# Patient Record
Sex: Male | Born: 1945
Health system: Southern US, Community
[De-identification: ages and names within clinical notes are randomized; demographics above are authoritative.]

## PROBLEM LIST (undated history)

## (undated) DIAGNOSIS — I10 Essential (primary) hypertension: Secondary | ICD-10-CM

## (undated) DIAGNOSIS — D649 Anemia, unspecified: Secondary | ICD-10-CM

## (undated) DIAGNOSIS — R3915 Urgency of urination: Secondary | ICD-10-CM

## (undated) DIAGNOSIS — Z148 Genetic carrier of other disease: Secondary | ICD-10-CM

## (undated) DIAGNOSIS — I4729 Other ventricular tachycardia: Secondary | ICD-10-CM

## (undated) DIAGNOSIS — Z8709 Personal history of other diseases of the respiratory system: Secondary | ICD-10-CM

## (undated) DIAGNOSIS — K219 Gastro-esophageal reflux disease without esophagitis: Secondary | ICD-10-CM

## (undated) DIAGNOSIS — T4145XA Adverse effect of unspecified anesthetic, initial encounter: Secondary | ICD-10-CM

## (undated) DIAGNOSIS — F0390 Unspecified dementia without behavioral disturbance: Secondary | ICD-10-CM

## (undated) DIAGNOSIS — I2510 Atherosclerotic heart disease of native coronary artery without angina pectoris: Secondary | ICD-10-CM

## (undated) DIAGNOSIS — R519 Headache, unspecified: Secondary | ICD-10-CM

## (undated) DIAGNOSIS — E7800 Pure hypercholesterolemia, unspecified: Secondary | ICD-10-CM

## (undated) DIAGNOSIS — I472 Ventricular tachycardia: Secondary | ICD-10-CM

## (undated) DIAGNOSIS — K5900 Constipation, unspecified: Secondary | ICD-10-CM

## (undated) DIAGNOSIS — R112 Nausea with vomiting, unspecified: Secondary | ICD-10-CM

## (undated) DIAGNOSIS — I96 Gangrene, not elsewhere classified: Secondary | ICD-10-CM

## (undated) DIAGNOSIS — I5022 Chronic systolic (congestive) heart failure: Secondary | ICD-10-CM

## (undated) DIAGNOSIS — Z95810 Presence of automatic (implantable) cardiac defibrillator: Secondary | ICD-10-CM

## (undated) DIAGNOSIS — C259 Malignant neoplasm of pancreas, unspecified: Secondary | ICD-10-CM

## (undated) DIAGNOSIS — R51 Headache: Secondary | ICD-10-CM

## (undated) DIAGNOSIS — Z950 Presence of cardiac pacemaker: Secondary | ICD-10-CM

## (undated) DIAGNOSIS — Z9289 Personal history of other medical treatment: Secondary | ICD-10-CM

## (undated) DIAGNOSIS — M1990 Unspecified osteoarthritis, unspecified site: Secondary | ICD-10-CM

## (undated) DIAGNOSIS — T8859XA Other complications of anesthesia, initial encounter: Secondary | ICD-10-CM

## (undated) DIAGNOSIS — J189 Pneumonia, unspecified organism: Secondary | ICD-10-CM

## (undated) DIAGNOSIS — I447 Left bundle-branch block, unspecified: Secondary | ICD-10-CM

## (undated) DIAGNOSIS — I480 Paroxysmal atrial fibrillation: Secondary | ICD-10-CM

## (undated) DIAGNOSIS — N183 Chronic kidney disease, stage 3 unspecified: Secondary | ICD-10-CM

## (undated) DIAGNOSIS — I428 Other cardiomyopathies: Secondary | ICD-10-CM

## (undated) DIAGNOSIS — Z87442 Personal history of urinary calculi: Secondary | ICD-10-CM

## (undated) DIAGNOSIS — E119 Type 2 diabetes mellitus without complications: Secondary | ICD-10-CM

## (undated) DIAGNOSIS — G629 Polyneuropathy, unspecified: Secondary | ICD-10-CM

## (undated) DIAGNOSIS — I739 Peripheral vascular disease, unspecified: Secondary | ICD-10-CM

## (undated) DIAGNOSIS — Z1509 Genetic susceptibility to other malignant neoplasm: Secondary | ICD-10-CM

## (undated) DIAGNOSIS — R350 Frequency of micturition: Secondary | ICD-10-CM

## (undated) DIAGNOSIS — Z9889 Other specified postprocedural states: Secondary | ICD-10-CM

## (undated) DIAGNOSIS — Z8041 Family history of malignant neoplasm of ovary: Secondary | ICD-10-CM

## (undated) HISTORY — DX: Other cardiomyopathies: I42.8

## (undated) HISTORY — DX: Essential (primary) hypertension: I10

## (undated) HISTORY — DX: Gangrene, not elsewhere classified: I96

## (undated) HISTORY — DX: Malignant neoplasm of pancreas, unspecified: C25.9

## (undated) HISTORY — DX: Family history of malignant neoplasm of ovary: Z80.41

## (undated) HISTORY — PX: ESOPHAGOGASTRODUODENOSCOPY: SHX1529

## (undated) HISTORY — DX: Gastro-esophageal reflux disease without esophagitis: K21.9

## (undated) HISTORY — PX: COLONOSCOPY: SHX174

---

## 1992-08-25 HISTORY — PX: CERVICAL SPINE SURGERY: SHX589

## 1999-08-26 HISTORY — PX: LITHOTRIPSY: SUR834

## 2000-08-19 ENCOUNTER — Inpatient Hospital Stay (HOSPITAL_COMMUNITY): Admission: EM | Admit: 2000-08-19 | Discharge: 2000-08-21 | Payer: Self-pay | Admitting: Emergency Medicine

## 2000-08-19 ENCOUNTER — Encounter: Payer: Self-pay | Admitting: Urology

## 2000-08-19 ENCOUNTER — Encounter: Payer: Self-pay | Admitting: Emergency Medicine

## 2000-08-28 ENCOUNTER — Encounter: Payer: Self-pay | Admitting: Urology

## 2000-08-28 ENCOUNTER — Encounter: Admission: RE | Admit: 2000-08-28 | Discharge: 2000-08-28 | Payer: Self-pay | Admitting: Urology

## 2000-09-14 ENCOUNTER — Ambulatory Visit (HOSPITAL_COMMUNITY): Admission: RE | Admit: 2000-09-14 | Discharge: 2000-09-14 | Payer: Self-pay | Admitting: Urology

## 2000-09-14 ENCOUNTER — Encounter: Payer: Self-pay | Admitting: Urology

## 2000-09-30 ENCOUNTER — Encounter: Admission: RE | Admit: 2000-09-30 | Discharge: 2000-09-30 | Payer: Self-pay | Admitting: Urology

## 2000-09-30 ENCOUNTER — Encounter: Payer: Self-pay | Admitting: Urology

## 2001-10-01 ENCOUNTER — Ambulatory Visit (HOSPITAL_COMMUNITY): Admission: RE | Admit: 2001-10-01 | Discharge: 2001-10-01 | Payer: Self-pay | Admitting: Cardiology

## 2001-10-01 HISTORY — PX: CARDIAC CATHETERIZATION: SHX172

## 2003-12-21 ENCOUNTER — Inpatient Hospital Stay (HOSPITAL_COMMUNITY): Admission: AD | Admit: 2003-12-21 | Discharge: 2003-12-22 | Payer: Self-pay | Admitting: Internal Medicine

## 2004-09-16 ENCOUNTER — Ambulatory Visit: Payer: Self-pay | Admitting: Internal Medicine

## 2004-12-26 ENCOUNTER — Ambulatory Visit: Payer: Self-pay | Admitting: Cardiology

## 2004-12-26 ENCOUNTER — Ambulatory Visit: Payer: Self-pay | Admitting: Internal Medicine

## 2005-06-10 ENCOUNTER — Ambulatory Visit: Payer: Self-pay | Admitting: Internal Medicine

## 2005-09-11 ENCOUNTER — Ambulatory Visit: Payer: Self-pay

## 2005-12-19 ENCOUNTER — Ambulatory Visit: Payer: Self-pay | Admitting: Internal Medicine

## 2006-07-07 ENCOUNTER — Ambulatory Visit: Payer: Self-pay | Admitting: Internal Medicine

## 2006-12-07 ENCOUNTER — Ambulatory Visit: Payer: Self-pay | Admitting: Internal Medicine

## 2007-03-08 ENCOUNTER — Ambulatory Visit: Payer: Self-pay | Admitting: Internal Medicine

## 2007-06-02 ENCOUNTER — Ambulatory Visit: Payer: Self-pay | Admitting: Internal Medicine

## 2007-09-06 ENCOUNTER — Ambulatory Visit: Payer: Self-pay | Admitting: Internal Medicine

## 2007-12-17 ENCOUNTER — Ambulatory Visit: Payer: Self-pay | Admitting: Internal Medicine

## 2008-03-08 ENCOUNTER — Ambulatory Visit: Payer: Self-pay | Admitting: Internal Medicine

## 2008-03-21 HISTORY — PX: US ECHOCARDIOGRAPHY: HXRAD669

## 2008-06-08 ENCOUNTER — Ambulatory Visit: Payer: Self-pay | Admitting: Internal Medicine

## 2008-09-13 ENCOUNTER — Ambulatory Visit: Payer: Self-pay | Admitting: Internal Medicine

## 2008-09-13 ENCOUNTER — Encounter: Payer: Self-pay | Admitting: Internal Medicine

## 2008-09-29 DIAGNOSIS — I5042 Chronic combined systolic (congestive) and diastolic (congestive) heart failure: Secondary | ICD-10-CM

## 2008-09-29 DIAGNOSIS — I447 Left bundle-branch block, unspecified: Secondary | ICD-10-CM

## 2008-09-29 DIAGNOSIS — I428 Other cardiomyopathies: Secondary | ICD-10-CM

## 2009-03-20 HISTORY — PX: CARDIOVASCULAR STRESS TEST: SHX262

## 2009-03-29 ENCOUNTER — Ambulatory Visit: Payer: Self-pay | Admitting: Internal Medicine

## 2009-03-29 ENCOUNTER — Encounter: Payer: Self-pay | Admitting: Internal Medicine

## 2009-06-11 ENCOUNTER — Telehealth: Payer: Self-pay | Admitting: Internal Medicine

## 2009-06-11 ENCOUNTER — Encounter: Payer: Self-pay | Admitting: Internal Medicine

## 2009-06-12 ENCOUNTER — Ambulatory Visit: Payer: Self-pay | Admitting: Internal Medicine

## 2009-06-25 HISTORY — PX: CARDIAC DEFIBRILLATOR PLACEMENT: SHX171

## 2009-07-05 ENCOUNTER — Ambulatory Visit (HOSPITAL_COMMUNITY): Admission: RE | Admit: 2009-07-05 | Discharge: 2009-07-05 | Payer: Self-pay | Admitting: Internal Medicine

## 2009-07-05 ENCOUNTER — Ambulatory Visit: Payer: Self-pay | Admitting: Internal Medicine

## 2009-07-12 ENCOUNTER — Telehealth: Payer: Self-pay | Admitting: Internal Medicine

## 2009-07-12 ENCOUNTER — Encounter: Payer: Self-pay | Admitting: Internal Medicine

## 2009-07-13 ENCOUNTER — Ambulatory Visit: Payer: Self-pay | Admitting: Internal Medicine

## 2009-08-12 ENCOUNTER — Encounter: Payer: Self-pay | Admitting: Internal Medicine

## 2009-12-18 ENCOUNTER — Ambulatory Visit: Payer: Self-pay | Admitting: Internal Medicine

## 2010-03-21 ENCOUNTER — Ambulatory Visit: Payer: Self-pay | Admitting: Internal Medicine

## 2010-04-04 ENCOUNTER — Ambulatory Visit: Payer: Self-pay | Admitting: Cardiology

## 2010-04-17 ENCOUNTER — Ambulatory Visit: Payer: Self-pay | Admitting: Cardiology

## 2010-05-22 ENCOUNTER — Ambulatory Visit: Payer: Self-pay | Admitting: Cardiology

## 2010-06-20 ENCOUNTER — Ambulatory Visit: Payer: Self-pay | Admitting: Cardiology

## 2010-06-27 ENCOUNTER — Ambulatory Visit: Payer: Self-pay | Admitting: Internal Medicine

## 2010-07-04 ENCOUNTER — Ambulatory Visit: Payer: Self-pay | Admitting: Cardiology

## 2010-08-01 ENCOUNTER — Ambulatory Visit: Payer: Self-pay | Admitting: Cardiology

## 2010-09-04 ENCOUNTER — Ambulatory Visit: Payer: Self-pay | Admitting: Cardiology

## 2010-09-26 ENCOUNTER — Ambulatory Visit: Admit: 2010-09-26 | Payer: Self-pay | Admitting: Internal Medicine

## 2010-09-26 ENCOUNTER — Encounter (INDEPENDENT_AMBULATORY_CARE_PROVIDER_SITE_OTHER): Payer: Medicare Other

## 2010-09-26 DIAGNOSIS — I428 Other cardiomyopathies: Secondary | ICD-10-CM

## 2010-09-26 NOTE — Cardiovascular Report (Signed)
Summary: Office Visit Remote   Office Visit Remote   Imported By: Roderic Ovens 04/15/2010 13:17:36  _____________________________________________________________________  External Attachment:    Type:   Image     Comment:   External Document

## 2010-09-26 NOTE — Cardiovascular Report (Signed)
Summary: Office Visit   Office Visit   Imported By: Roderic Ovens 12/25/2009 16:46:12  _____________________________________________________________________  External Attachment:    Type:   Image     Comment:   External Document

## 2010-09-26 NOTE — Assessment & Plan Note (Signed)
Summary: defib/sf   Referring Provider:  Roger Shelter MD   History of Present Illness:   [name] [name] is seen as a followup as per MADIT-CRT trial implanted for a nonischemic cardiomyopathy with a CRT device. He is status post generator replacement associated with some  arm pain and a sense of throbbing  Current Medications (verified): 1)  Diovan 80 Mg Tabs (Valsartan) .... Once Daily 2)  Coreg 12.5 Mg Tabs (Carvedilol) .... Take One Tablet Am, 1 1/2 Tablet Pm and 1 Tablet At Night 3)  Furosemide 40 Mg Tabs (Furosemide) .... Two Times A Day 4)  Spironolactone 25 Mg Tabs (Spironolactone) .... Take One Half Tablet Once Daily 5)  Lipitor 10 Mg Tabs (Atorvastatin Calcium) .... Once Daily 6)  Avandia 2 Mg Tabs (Rosiglitazone Maleate) .... Once Daily 7)  Metformin Hcl 500 Mg Tabs (Metformin Hcl) .... Two Times A Day 8)  Protonix 40 Mg Pack (Pantoprazole Sodium) .... Once Daily 9)  Coumadin 4 Mg Tabs (Warfarin Sodium) .... Take As Directed 10)  Glipizide 10 Mg Tabs (Glipizide) .... Two Times A Day  Allergies (verified): No Known Drug Allergies  Past History:  Past Medical History: Last updated: 12/17/2009 NON-ischemic cardiomyopathy Congestive heart failure-systolic-chronic Status post ICD-Guidant--[employer] Contak [id number] explanted [employer] 100-D-Implanted 2010 Diabetes Edema left bundle branch block tachypalpitations G E R D Obesity  Past Surgical History: Last updated: 12/17/2009 neck surgery [employer] Contak H175/[id number] explanted [employer] 100-D--06/2009 implanted  Family History: Last updated: 09/29/2008 Family History of Diabetes:   Social History: Last updated: 09/29/2008 Retired  Married  Tobacco Use - No.  Alcohol Use - no  Vital Signs:  Patient profile:   65 year old male Height:      67 inches Weight:      208 pounds BMI:     32.70 Pulse rate:   90 / minute BP sitting:   118 / 66  (left arm) Cuff  size:   regular  Physical Exam  General:  The patient was alert and oriented in no acute distress. HEENT Normal.  Neck veins were flat, carotids were brisk.  Lungs were clear.  Heart sounds were regular without murmurs or gallops.  Abdomen was soft with active bowel sounds. There is no clubbing cyanosis or edema. Skin Warm and dry     ICD Specifications Following MD:  Sherryl Manges, MD     ICD Vendor:  [facility] [employer]     ICD Model Number:  100-D     ICD Serial Number:  [id number] ICD DOI:  07/05/2009     ICD Implanting MD:  Sherryl Manges, MD  Lead 1:    Location: RA     DOI: 12/22/2003     Model #: [id number]     Serial #: [id number]     Status: active Lead 2:    Location: RV     DOI: 12/22/2003     Model #: [id number]     Serial #: [id number]     Status: active Lead 3:    Location: LV     DOI: 12/22/2003     Model #: [id number]     Serial #: [id number]     Status: active  Indications::  ICM, MADIT-CRT  Explantation Comments: 07/05/2009 [employer] Contak [id number] explanted  ICD Follow Up Remote Check?  No Charge Time:  8.4 seconds     Battery Est. Longevity:  BOL Underlying rhythm:  SR@90  ICD Dependent:  No  ICD Device Measurements Atrium:  Amplitude: 5.6 mV, Impedance: 974 ohms, Threshold: 0.9 V at 0.4 msec Right Ventricle:  Amplitude: 10.2 mV, Impedance: 504 ohms, Threshold: 1.0 V at 0.4 msec Left Ventricle:  Amplitude: 21.5 mV, Impedance: 644 ohms, Threshold: 1.0 V at 0.4 msec Shock Impedance: 48 ohms   Episodes MS Episodes:  0     Percent Mode Switch:  0     Coumadin:  Yes Shock:  0     ATP:  0     Nonsustained:  9     Atrial Pacing:  0%     Ventricular Pacing:  100%  Brady Parameters Mode DDD     Lower Rate Limit:  40     Upper Rate Limit 120 PAV 80      Tachy Zones VF:  240     VT:  200     Tech Comments:  9 NSVT episodes lasting only 2-3 seconds.  Latitude transmissions every 3 months.  ROV 1 year with Dr. Graciela Husbands.  Checked by [employer]. Altha Harm, LPN  December 18, 2009 12:09  PM   Impression & Recommendations:  Problem # 1:  ICD -[employer] 100-D (ICD-V45.02) Device parameters and data were reviewed and no changes were made  Problem # 2:  CARDIOMYOPATHY, PRIMARY, DILATED (ICD-425.4) stable on current meds  His updated medication list for this problem includes:    Diovan 80 Mg Tabs (Valsartan) ..... Once daily    Coreg 12.5 Mg Tabs (Carvedilol) .[name]... Take one tablet am, 1 1/2 tablet pm and 1 tablet at night    Furosemide 40 Mg Tabs (Furosemide) .[name]..[name] Two times a day    Spironolactone 25 Mg Tabs (Spironolactone) .[name]... Take one half tablet once daily    Coumadin 4 Mg Tabs (Warfarin sodium) .[name]... Take as directed  Problem # 3:  SYSTOLIC HEART FAILURE, CHRONIC (ICD-428.22) Stable on current meds His updated medication list for this problem includes:    Diovan 80 Mg Tabs (Valsartan) ..... Once daily    Coreg 12.5 Mg Tabs (Carvedilol) .[name]... Take one tablet am, 1 1/2 tablet pm and 1 tablet at night    Furosemide 40 Mg Tabs (Furosemide) .[name]..[name] Two times a day    Spironolactone 25 Mg Tabs (Spironolactone) .[name]... Take one half tablet once daily    Coumadin 4 Mg Tabs (Warfarin sodium) .[name]... Take as directed  His updated medication list for this problem includes:    Diovan 80 Mg Tabs (Valsartan) ..... Once daily    Coreg 12.5 Mg Tabs (Carvedilol) .[name]... Take one tablet am, 1 1/2 tablet pm and 1 tablet at night    Furosemide 40 Mg Tabs (Furosemide) .[name]..[name] Two times a day    Spironolactone 25 Mg Tabs (Spironolactone) .[name]... Take one half tablet once daily    Coumadin 4 Mg Tabs (Warfarin sodium) .[name]... Take as directed  Patient Instructions: 1)  Your physician wants you to follow-up in:  12 months with Dr Graciela Husbands. You will receive a reminder letter in the mail two months in advance. If you don't receive a letter, please call our office to schedule the follow-up appointment.

## 2010-09-26 NOTE — Cardiovascular Report (Signed)
Summary: Office Visit Remote   Office Visit Remote   Imported By: Roderic Ovens 07/04/2010 15:20:17  _____________________________________________________________________  External Attachment:    Type:   Image     Comment:   External Document

## 2010-09-27 ENCOUNTER — Encounter: Payer: Self-pay | Admitting: Internal Medicine

## 2010-09-30 ENCOUNTER — Encounter (INDEPENDENT_AMBULATORY_CARE_PROVIDER_SITE_OTHER): Payer: MEDICARE

## 2010-09-30 DIAGNOSIS — Z7901 Long term (current) use of anticoagulants: Secondary | ICD-10-CM

## 2010-09-30 DIAGNOSIS — I428 Other cardiomyopathies: Secondary | ICD-10-CM

## 2010-10-14 ENCOUNTER — Encounter (INDEPENDENT_AMBULATORY_CARE_PROVIDER_SITE_OTHER): Payer: Self-pay | Admitting: *Deleted

## 2010-10-15 ENCOUNTER — Ambulatory Visit (INDEPENDENT_AMBULATORY_CARE_PROVIDER_SITE_OTHER): Payer: Medicare Other | Admitting: Cardiology

## 2010-10-15 DIAGNOSIS — Z7901 Long term (current) use of anticoagulants: Secondary | ICD-10-CM

## 2010-10-15 DIAGNOSIS — I428 Other cardiomyopathies: Secondary | ICD-10-CM

## 2010-10-22 NOTE — Letter (Signed)
Summary: Remote Device Check  [facility], Main Office  [address]   [place], [place] [address]   Phone: [phone]  Fax: [phone]     October 14, 2010 MRN: [id number]   DEIGO ALONSO [address] [place], [place]  [address]   Dear Mr. Hartsock,   Your remote transmission was recieved and reviewed by your physician.  All diagnostics were within normal limits for you.  __X____Your next office visit is scheduled for:  May 2012 with Dr Graciela Husbands. Please call our office to schedule an appointment.    Sincerely,  Vella Kohler

## 2010-10-22 NOTE — Cardiovascular Report (Signed)
Summary: Office Visit Remote   Office Visit Remote   Imported By: Roderic Ovens 10/18/2010 11:48:49  _____________________________________________________________________  External Attachment:    Type:   Image     Comment:   External Document

## 2010-11-11 ENCOUNTER — Encounter: Payer: Self-pay | Admitting: *Deleted

## 2010-11-13 ENCOUNTER — Ambulatory Visit (INDEPENDENT_AMBULATORY_CARE_PROVIDER_SITE_OTHER): Payer: Medicare Other | Admitting: *Deleted

## 2010-11-13 DIAGNOSIS — I428 Other cardiomyopathies: Secondary | ICD-10-CM

## 2010-11-13 DIAGNOSIS — Z7901 Long term (current) use of anticoagulants: Secondary | ICD-10-CM

## 2010-11-13 LAB — POCT INR: INR: 2.8

## 2010-11-21 ENCOUNTER — Other Ambulatory Visit: Payer: Self-pay | Admitting: *Deleted

## 2010-11-21 DIAGNOSIS — I10 Essential (primary) hypertension: Secondary | ICD-10-CM

## 2010-11-21 MED ORDER — LOSARTAN POTASSIUM 100 MG PO TABS: 100.0000 mg | ORAL_TABLET | Freq: Every day | ORAL | Status: DC

## 2010-11-27 LAB — CBC
Hemoglobin: 11.4 g/dL — ABNORMAL LOW (ref 13.0–17.0)
MCHC: 33.8 g/dL (ref 30.0–36.0)
Platelets: 204 10*3/uL (ref 150–400)
RDW: 14.6 % (ref 11.5–15.5)

## 2010-11-27 LAB — BASIC METABOLIC PANEL
CO2: 23 mEq/L (ref 19–32)
Calcium: 8.9 mg/dL (ref 8.4–10.5)
Creatinine, Ser: 0.99 mg/dL (ref 0.4–1.5)
GFR calc non Af Amer: >60 mL/min (ref >60)
Glucose, Bld: 272 mg/dL — ABNORMAL HIGH (ref 70–99)
Sodium: 138 mEq/L (ref 135–145)

## 2010-11-27 LAB — PROTIME-INR
INR: 1.6 — ABNORMAL HIGH (ref 0.00–1.49)
Prothrombin Time: 18.9 seconds — ABNORMAL HIGH (ref 11.6–15.2)

## 2010-12-12 ENCOUNTER — Encounter: Payer: Medicare Other | Admitting: *Deleted

## 2010-12-13 ENCOUNTER — Ambulatory Visit (INDEPENDENT_AMBULATORY_CARE_PROVIDER_SITE_OTHER): Payer: Medicare Other | Admitting: *Deleted

## 2010-12-13 DIAGNOSIS — Z7901 Long term (current) use of anticoagulants: Secondary | ICD-10-CM

## 2010-12-13 DIAGNOSIS — I428 Other cardiomyopathies: Secondary | ICD-10-CM

## 2010-12-23 ENCOUNTER — Other Ambulatory Visit: Payer: Self-pay | Admitting: *Deleted

## 2010-12-23 DIAGNOSIS — E7800 Pure hypercholesterolemia, unspecified: Secondary | ICD-10-CM

## 2010-12-26 ENCOUNTER — Ambulatory Visit (INDEPENDENT_AMBULATORY_CARE_PROVIDER_SITE_OTHER): Payer: Medicare Other | Admitting: *Deleted

## 2010-12-26 DIAGNOSIS — I428 Other cardiomyopathies: Secondary | ICD-10-CM

## 2011-01-01 ENCOUNTER — Encounter: Payer: Self-pay | Admitting: *Deleted

## 2011-01-02 NOTE — Progress Notes (Signed)
icd remote check  

## 2011-01-07 NOTE — Assessment & Plan Note (Signed)
[name]                         ELECTROPHYSIOLOGY OFFICE NOTE   [name] [name], [name] [name]                  MRN:          [id number]  DATE:09/13/2008                            DOB:          August 14, 1946    [name] [name] is seen as a followup as per MADIT-CRT trial and doing quite  well.  He denies complaints of chest pain, shortness of breath, or  peripheral edema.   CURRENT MEDICATIONS:  1. Valsartan 80.  2. Carvedilol 12.5 b.i.d.  3. Furosemide 40 b.i.d.  4. Spironolactone 12.5.  5. Atorvastatin 10.  6. Januvia 100.  7. Avandia 2.  8. Glipizide 10 b.i.d.  9. Coumadin 10.  10.Metformin 100/750.   PHYSICAL EXAMINATION:  VITAL SIGNS:  Blood pressure was 127/63, his  pulse was 77, his weight was 216 which is stable.  LUNGS:  Clear.  HEART:  Sounds were regular.  EXTREMITIES:  Had no edema.   Interrogation of his Guidant ICD, I have misplaced, but was functioning  normally.   We will plan to see him again as part of the MADIT-CRT followup.     Duke Salvia, MD, [facility]  Electronically Signed    SCK/MedQ  DD: 09/13/2008  DT: 09/14/2008  Job #: [id number]   cc:   Colleen Can. [name], M.D.

## 2011-01-07 NOTE — Assessment & Plan Note (Signed)
 HEALTHCARE                         ELECTROPHYSIOLOGY OFFICE NOTE   NAME:[name], [name] DENNING                  MRN:          [id number]  DATE:12/17/2007                            DOB:          [birth year]    OBJECTIVE:  [name] is seen in followup for an ICD implanted as per  the maintenance CRT trial.  He has non-ischemic heart disease and left  bundle branch block.  He continues to do pretty well, without complaints  of chest pain or shortness of breath.   MEDICATIONS:  His medications include:  1. Diovan.  2. Coreg 12.5 b.i.d.  3. Furosemide.  4. Coumadin.  5. Aldactone 12.5.  6. Lipitor 10.  7. Metformin 1000/750.  8. Avandia 2.  9. Glipizide 10 b.i.d.  10.Januvia 100.  11.Protonix.   EXAMINATION:  VITAL SIGNS:  His blood pressure was mildly elevated at  141/68, his pulse of 79.  His weight was 216.  LUNGS:  His lungs were clear.  HEART:  His heart sounds were regular.  EXTREMITIES:  Without edema.  ABDOMEN:  Soft.   His electrocardiogram demonstrated P-synchronous pacing.   His device was functioning normally.  I unfortunately do not have those  parameters.  [name] is doing quite well.  At his next visit, I would  like to think of looking into a sleep study, as well as increasing his  Coreg.   We will see him as part of immediate CRT followup.     Duke Salvia, MD, [facility]  Electronically Signed    SCK/MedQ  DD: 12/17/2007  DT: 12/17/2007  Job #: [id number]

## 2011-01-07 NOTE — Assessment & Plan Note (Signed)
[facility]                         ELECTROPHYSIOLOGY OFFICE NOTE   LAYTON, NAVES                  MRN:          [id number]  DATE:12/07/2006                            DOB:          [birth year]    [name] [name] comes in.  He is status post ICD implantation for primary  prevention in the context of the MADIT-CRT trial.  He has diabetes and  nonischemic heart disease.   He denies chest pain or shortness of breath.   His weight remains high at 218 pounds.   MEDICATIONS:  Notable for his Coreg dose at 12.5 b.i.d.  His Diovan is  80, and he is on metformin, Avandia, glipizide, and Januvia for  diabetes.   PHYSICAL EXAMINATION:  VITAL SIGNS:  Blood pressure 120/67, pulse 87.  LUNGS:  Clear.  CARDIAC:  Heart sounds were regular.  EXTREMITIES:  Without edema.   Interrogation of his Guidant H175 device demonstrates an R wave of 11.1,  impedance of 562, threshold of 0.8 at 0.5.  R wave was 13.8 with  impedance of 666, threshold of 1 volt at 0.5.  The P wave was 9.6 with  impedance of 541, threshold 0.4 at 0.5.  Battery voltage was 2.66.   IMPRESSION:  1. Nonischemic cardiomyopathy.  2. Left bundle branch block.  3. Status post CRT implantable cardioverter-defibrillator as part of      MADIT  protocol.  4. Obesity.   [name] [name] is stable.  I have encouraged him on weight loss and his  exercise program.  I asked him to follow up with Dr. [name] regarding  up titration of Coreg.   We will see him again as part of protocol followup.     Duke Salvia, MD, [facility]  Electronically Signed    SCK/MedQ  DD: 12/07/2006  DT: 12/07/2006  Job #: [id number]   cc:   Colleen Can. [name], M.D.  Windle Guard, M.D.

## 2011-01-07 NOTE — Letter (Signed)
June 02, 2007    Colleen Can. [name], M.D.  [address], [address]  [place], [place] [address]   RE:  [name] [name], [name] [name]  MRN:  [id number]  /  DOB:  [birth year]   Dear Weyman Croon:   [name] comes in. He is part of the MADIT CRT trial, and he is doing  pretty well. He has had a little bit of peripheral edema which he  manages by adjusting his furosemide.   His wife is also raising some questions about confusion and memory.   His medications in those regards are notable for the Avandia as well as  the Lipitor.   On examination, his blood pressure was well controlled at 121/68. His  pulse was 74. His weight was down a little bit at 214.  His lungs were clear.  His heart sounds were regular.  The extremities had 2+ edema.   Interrogation of his Guidant ICD demonstrates a P wave of 7.5 with  impedance of 429, a threshold of 0.4 at 0.5. R wave was 10.8 with  impedance of 559, a threshold of 0.8 at 0.5. L wave was 15.3 with a  pacing impedance of 647, a threshold of 1.2 at 0.5. Battery voltage was  2.59. There were no intercurrent therapies.   IMPRESSION:  1. Ischemic cardiomyopathy. Depressed left ventricular function.  2. Class II congestive failure.  3. Peripheral edema.  4. Diabetes on Avandia, question contributing to #3.  5. Confusion, question related to Lipitor.   [name], [name], is doing pretty well. I did suggest that when his wife  raised the issue about confusion that he go ahead and stop his Lipitor  for a couple of months until he sees you in December and see if it does  not abate. I have also suggested that he follow up with Dr. Jeannetta Nap  concerning his Avandia, which may be contributing to his edema, although  his edema is pretty well compensated and may not be worth making any  adjustments in this regard.   I will defer those to your mutual expertise. We will see him again as  part of the MADIT CRT followup.    Sincerely,      Duke Salvia, MD,  [facility]  Electronically Signed    SCK/MedQ  DD: 06/02/2007  DT: 06/03/2007  Job #: [id number]   CC:    Windle Guard, M.D.

## 2011-01-10 ENCOUNTER — Ambulatory Visit (INDEPENDENT_AMBULATORY_CARE_PROVIDER_SITE_OTHER): Payer: Medicare Other | Admitting: *Deleted

## 2011-01-10 ENCOUNTER — Other Ambulatory Visit (INDEPENDENT_AMBULATORY_CARE_PROVIDER_SITE_OTHER): Payer: Medicare Other | Admitting: *Deleted

## 2011-01-10 DIAGNOSIS — I428 Other cardiomyopathies: Secondary | ICD-10-CM

## 2011-01-10 DIAGNOSIS — Z7901 Long term (current) use of anticoagulants: Secondary | ICD-10-CM

## 2011-01-10 DIAGNOSIS — E7800 Pure hypercholesterolemia, unspecified: Secondary | ICD-10-CM

## 2011-01-10 LAB — HEPATIC FUNCTION PANEL
ALT: 19 U/L (ref 0–53)
AST: 16 U/L (ref 0–37)
Albumin: 3.4 g/dL — ABNORMAL LOW (ref 3.5–5.2)
Alkaline Phosphatase: 56 U/L (ref 39–117)
Bilirubin, Direct: 0 mg/dL (ref 0.0–0.3)
Total Protein: 6.3 g/dL (ref 6.0–8.3)

## 2011-01-10 LAB — BASIC METABOLIC PANEL
CO2: 26 mEq/L (ref 19–32)
Chloride: 105 mEq/L (ref 96–112)
Glucose, Bld: 207 mg/dL — ABNORMAL HIGH (ref 70–99)
Sodium: 138 mEq/L (ref 135–145)

## 2011-01-10 LAB — POCT INR: INR: 2.7

## 2011-01-10 LAB — LIPID PANEL: Total CHOL/HDL Ratio: 4

## 2011-01-10 NOTE — Op Note (Signed)
[facility]  Patient:    [name] [name], [name] [name]                  MRN: [id number] Proc. Date: 08/20/00 Adm. Date:  [id number] Attending:  Laqueta Jean                           Operative Report  PREOPERATIVE DIAGNOSIS:  Impacted left ureteral calculus.  POSTOPERATIVE DIAGNOSIS:  Impacted left ureteral calculus.  OPERATION: Cystourethroscopy, left retrograde pyelogram, left JJ catheter insertion.  SURGEON:  Sigmund I. Patsi Sears, M.D.  ANESTHESIA:  General (LMA).  PREPARATION:  After appropriate pre-anesthesia, the patient was brought to the operating room and placed on the operating table in the dorsal supine position where general LMA anesthesia was introduced.  He as then replaced in the dorsal lithotomy position where the pubis was prepped with Betadine solution and draped in the usual fashion.  DESCRIPTION OF PROCEDURE:  Review of x-rays show a 7 x 5 mm left upper ureteral calculus, and Cystourethroscopy was accomplished, and retrograde pyelogram shows the same stone as on x-ray.  It appears to be impacted in the left upper ureter.  A guidewire is passed around the stone, into the renal pelvis,  and a 6 x 26 cm JJ catheter is passed into the renal pelvis and coiled in the bladder.  The patient tolerated the procedure well and was given a B & O suppository, IV Toradol, and awakened and taken to the recovery room in good condition. DD:  08/20/00 TD:  08/20/00 Job: [id number]

## 2011-01-10 NOTE — Cardiovascular Report (Signed)
[name]  Patient:    [name] [name], DAVIDOW Visit Number: [id number] MRN: [id number]          Service Type: CAT Location: [facility] 2852 01 Attending Physician:  [name] Dictated by:   Colleen Can [name], M.D. Proc. Date: 10/01/01 Admit Date:  10/01/2001 Discharge Date: 10/01/2001   CC:         [name]  Buren Kos, M.D.   Cardiac Catheterization  HISTORY: The patient presents with a three-month history of shortness of breath. He has a history of diabetes. He has not had any chest pain syndrome. He has a left bundle-branch block. He had an abnormal two-day adenosine Cardiolite study with fixed septal perfusion defect with partial redistribution, left ventricular enlargement, and reduced anterior and inferior wall motion. He was referred for evaluation.  PROCEDURE: Left heart catheterization with selective coronary angiography, left ventricular angiography.  TYPE AND SITE OF ENTRY: Percutaneous right femoral artery with Perclose.  CATHETERS: A 6 French 4 curved Judkins right and left coronary catheters, 6 French pigtail ventriculographic catheter.  CONTRAST MATERIAL: Omnipaque.  MEDICATIONS GIVEN PRIOR TO THE PROCEDURE: Valium 10 mg p.o.  MEDICATIONS GIVEN DURING THE PROCEDURE: Versed 2 mg IV.  COMMENTS: The patient tolerated the procedure well.  HEMODYNAMIC DATA: The aortic pressure was 96/70, LV pressure was 93/15-27. There was no aortic valve gradient noted on pullback.  LEFT VENTRICULOGRAPHY: The left ventricular angiogram was performed in the RAO position.  Overall cardiac size was enlarged. There was global hypokinesia and the global ejection fraction would be approximately 25%. There is no significant mitral regurgitation noted. There appeared to be a global decrease in wall motion.  ANGIOGRAPHIC DATA: 1. Right coronary artery: The right coronary artery is a small dominant    vessel. It is  normal. 2. Left main coronary artery: Left main coronary artery is normal. 3. Left anterior descending: The left anterior descending is a long    vessel that wraps around the apex. It has 30-40% narrowing after the    first diagonal vessel. It is normal otherwise. There is no flow-restricting    disease. 4. Left circumflex: The left circumflex has a 30% narrowing in a proximal    one-third. It is a reasonably large vessel that continues to the apex    and bifurcates. The third branch extends to the inferior wall.  OVERALL IMPRESSION: 1. Severe global left ventricular dysfunction with elevated left ventricular    filling pressures. 2. Mild coronary atherosclerosis with 30-40% narrowing in the proximal    left anterior descending and 30% narrowing in the left circumflex with    minimal disease in a small right coronary artery.  DISCUSSION: These findings are most compatible with cardiomyopathy. There is some very mild arrhythmia-induced mitral regurgitation that was not felt to be significant. We will need to do a 2-D echocardiogram in advantage for left ventricular dysfunction. Dictated by:   Colleen Can [name], M.D. Attending Physician:  [name] DD:  10/01/01 TD:  10/02/01 Job: [id number]

## 2011-01-10 NOTE — H&P (Signed)
[facility]  Patient:    [name] [name], [name] [name]                  MRN: [id number] Adm. Date:  [id number] Attending:  Laqueta Jean CC:         Hadassah Pais. Jeannetta Nap, M.D.                         History and Physical  HISTORY OF PRESENT ILLNESS:  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  Emergency room evaluation eventually showed abdominal CT with left upper impacted ureteral stone, with hydronephrosis.  Because of the patients continued pain, and his nausea, vomiting, elevated temperature, elevated white count at 10,600, it was decided to admit the patient for antibiotic, diabetes control, nausea and vomiting control, and placement of double-J catheter.  He will eventually need lithotripsy of the identifiable left alveolar calculus (7 mm).  PAST MEDICAL HISTORY:  Significant for diabetes.  ALLERGIES:  None known.  MEDICATIONS: 1. Glucophage 500 mg two in the morning and one at night. 2. Diabinese 250 mg two in the morning with breakfast. 3. Lipitor 10 mg one per day.  HABITS:  Alcohol and tobacco are none.  PHYSICAL EXAMINATION:  VITAL SIGNS:  The patient is 5 feet 9 inches tall, 218 pounds.  Temperature is 100, pulse 94, respiratory rate 27, blood pressure 150/88.  GENERAL:  An obese black male in no acute distress.  HEENT:  PERLA in full.  NECK:  Supple, nontender.  No nodes.  CHEST:  Clear to P&A.  ABDOMEN:  Soft plus bowel sounds without organomegaly, without masses.  There is no CVA pain.  GENITALIA:  Normal male external genitalia.  The penis is uncircumcised.  RECTAL:  Not indicated this examination.  EXTREMITIES:  Without cyanosis or edema.  NEUROLOGICAL:  Psychologic.  ADMITTING IMPRESSION:  Review of the computed tomographic scan shows a identifiable left  upper ureteral calcification measuring 7 mm, with impaction, and hydronephrosis.  I have discussed the case with the patient and his wife. We will plan admission, IV antibiotic and double-J catheter placement tomorrow.  He will then be able to go back to work, and have elective lithotripsy as an outpatient. DD:  08/19/00 TD:  08/19/00 Job: [id number]

## 2011-01-10 NOTE — Discharge Summary (Signed)
NAME:  [name] [name], [name] [name] NO.:  [id number]   MEDICAL RECORD NO.:  [id number]                   PATIENT TYPE:  OIB   LOCATION:  2041                                 FACILITY:  [facility]   PHYSICIAN:  Duke Salvia, M.D.               DATE OF BIRTH:  March 13, 1946   DATE OF ADMISSION:  12/21/2003  DATE OF DISCHARGE:                                 DISCHARGE SUMMARY   PRIMARY DIAGNOSIS:  Ischemic cardiomyopathy.   HISTORY OF PRESENT ILLNESS:  This is a 65 year old gentleman with a history  of nonischemic cardiomyopathy that was identified about 3 years ago, modest  shortness of breath, moderate dyspnea on exertion but really only modest as  he is able to walk stairs.  Not infrequently able to get out 100 yards to  mailbox without difficulty.  Two-pillow orthopnea, nocturnal dyspnea,  peripheral edema.  The patient had a catheterization which demonstrated  nonobstructive disease.  He was admitted for placement of a biventricular  ICD.  He tolerated the procedure well, had no immediate postoperative  complications, and was discharged the following day in stable condition on  all of his previous medications:  1. Diovan 80 daily.  2. Coreg 12.5 b.i.d.  3. Lasix 40 b.i.d.  4. Coumadin 7.5 nightly except Saturday and Sunday 5.  5. Aldactone 12.5 daily.  6. Protonix 40 daily.  7. Lipitor 10 nightly.  8. Avandia 2 daily.  9. Tylenol one to two tablets q.4-6h. as needed for pain.   Activity and wound care were per device discharge sheet.  No driving for 2  weeks.  Low fat, low salt, low cholesterol diet.  He was to have a PT/INR  done next week on Wednesday with Dr. [name].  He was to have pacer clinic  at [name] Jan 04, 2004 at 9:30 a.m. and Dr. Graciela Husbands April 03, 2004 at 3:40  p.m.      Chinita Pester, C.R.N.P. [facility]                 Duke Salvia, M.D.    DS/MEDQ  D:  12/22/2003  T:  12/22/2003  Job:  [id number]   cc:   Colleen Can. [name], M.D.  Fax: [phone]   Windle Guard, M.D.  [address]  [place], [place] [address]  Fax: [phone]

## 2011-01-10 NOTE — Op Note (Signed)
NAME:  [name] [name], [name] [name]                     ACCOUNT NO.:  [id number]   MEDICAL RECORD NO.:  [id number]                   PATIENT TYPE:  OIB   LOCATION:  2899                                 FACILITY:  [facility]   PHYSICIAN:  Duke Salvia, M.D.               DATE OF BIRTH:  18-Nov-1945   DATE OF PROCEDURE:  12/21/2003  DATE OF DISCHARGE:                                 OPERATIVE REPORT   PREOPERATIVE DIAGNOSIS:  Ischemic cardiomyopathy, depressed left ventricular  function, ICD implantation for primary prevention (MADE IT - CRT).   POSTOPERATIVE DIAGNOSIS:  Ischemic cardiomyopathy, depressed left  ventricular function, ICD implantation for primary prevention (MADE IT -  CRT).   PROCEDURE:  Dual chamber defibrillator implantation with CRT placement as  part of the MADE IT-CRT protocol.   SURGEON:  Duke Salvia, M.D.   ASSISTANT:  Rollene Rotunda, M.D.   DESCRIPTION OF PROCEDURE:  Following obtaining informed consent, the patient  was brought to the electrophysiology laboratory and placed on the  fluoroscopic table in the supine position.  After routine prep and drape,  lidocaine was infiltrated in the prepectoral subclavicular region and  incision was made and carried down to the layer of the prepectoral fascia  using electrocautery and sharp dissection.  A pocket was formed.  Similarly  hemostasis was obtained.   At this point, attention was turned to gaining access to the extrathoracic  left subclavian vein which was accomplished without difficulty without the  aspiration of air or puncture of the artery.  Three separate venipunctures  were accomplished and around the two more cephalad wires a 0 silk suture was  placed in a figure-of-eight fashion and allowed to hang loosely.  Subsequently a 9 French tearaway introducer sheath was placed through which  was passed a Guidant model 158 dual coil defibrillator lead serial number  [id number].  It was manipulated under fluoroscopic  guidance to the right atrial  appendage where the bipolar R wave was 13.4 millivolts with a pacing  impedance of 715 ohms with threshold of 0.5 volts at 0.5 milliseconds,  current threshold of 0.6 MA and there was no diaphragmatic pacing at 10  volts.   The sleeve was secured to the prepectoral fascia and attention was then  turned to gaining access to the coronary sinus.  9.5 French tearaway  introducer sheath was placed and through this was passed a Guidant extended  hook with a Wooley wire.  Probing at the tricuspid annulus ultimately  allowed cannulation of the coronary sinus.  Venography was obtained without  a balloon and demonstrated a very narrow coronary sinus such that occlusive  venography could almost be obtained by placing a __________  of the sheath  in the midportion of the coronary sinus.  Venography was obtained, however,  nonocclusively in the AP, RAO, and LAO projections.  There was a  posterolateral branch that was identified that was quite small and then a  lateral branch that was identified.  We elected to pursue the lateral branch  and this was cannulated with a Guidant EDS Whisper wire in conjunction with  a model 4513 Unipolar left ventricular lead serial number [id number].  This was  positioned at the junction between the distal third and the proximal third  and was occluded in the lateral wall in both RAO and LAO projections.   In this location the bipolar L wave was 5.1 millivolts with impedance of 788  ohms, a pacing threshold of 1.2 volts at 0.5 milliseconds, current threshold  was 1.4 MA and again there was no diaphragmatic pacing at 10 volts.   The 9.5 French sheath was removed.  The CS delivery system was left in place  and over the last retained guide wire, a 7 French sheath was placed through  which was then passed a Medtronic 5076 52 cm active fixation atrial lead,  serial number [id number].  This was manipulated under fluoroscopic guidance  into the  right atrial appendage where the bipolar P wave was 4.9 millivolts  with a pacing impedance of 860 ohms and a pacing threshold of 1.4 volts at  0.5 milliseconds, current threshold was 2.1 MA.   With these acceptable parameters recorded, the atrial lead was secured and  the LV lead deployment system was removed under fluoroscopic guidance with  the use of a fixation wire.  The leads were then attached to a Guidant  Renewal device where LV impedance could not be measured and a different  device was utilized.  This was a model H175, serial number [id number].  Through  the device, the bipolar P wave was 5.4 millivolts with a pacing impedance of  699 ohms, a pacing threshold of 1.0 volts at 0.5 milliseconds.  The RV  amplitude was 13.1 millivolts with a pacing impedance of 511 ohms and the  threshold was 0.4 volts at 0.5 milliseconds and the L wave was 7.6  millivolts with impedance of 666 ohms and a pacing threshold of 1.4 volts at  0.5 milliseconds.  And again through the device and after removal of the  fixation system, there is no diaphragmatic pacing at 10 volts.   With these acceptable parameters recorded, the pocket was copiously  irrigated with antibiotic-containing saline solution.  The leads and the  pulse generator were then placed in the pocket and DFT testing was  undertaken.  Ventricular fibrillation was induced via a T wave shock.  After  a total duration of seven seconds, a 14 joule shock was delivered through a  measured resistance of 35 ohms, terminating ventricular fibrillation, and  restoring an AV paced rhythm.   After a wait of five to six minutes, ventricular fibrillation was reinduced  via the T wave shock.  After a total duration of six seconds, a 14 joule  shock was delivered through a measured resistance of 35 ohms, terminating  ventricular fibrillation and restoring an AV paced rhythm.  With these  acceptable parameters recorded, the system was implanted.  The pocket  was copiously irrigated with antibiotic-containing saline solution.  Hemostasis  was assured and the leads and the pulse generator were then placed in the  pocket and secured to the prepectoral fascia.  The wound was closed in three  layers in the normal fashion.  The wound was washed, dried, and a Benzoin  Steri-Strips  dressing was applied.  Needle, sponge, and instrument counts  correct at the end of the procedure according to the staff.  The patient tolerated the procedure without apparent complications.                                               Duke Salvia, M.D.    SCK/MEDQ  D:  12/21/2003  T:  12/21/2003  Job:  [id number]   cc:   Colleen Can. [name], M.D.  Fax: [phone]   [facility]   [facility]

## 2011-01-10 NOTE — Assessment & Plan Note (Signed)
[name]                           ELECTROPHYSIOLOGY OFFICE NOTE   [name], [name] [name]                  MRN:          [id number]  DATE:07/07/2006                            DOB:          02-Apr-1946    [name] [name] is seen today in followup for his device implanted as primary  prevention.  He is doing quite well.  He has no problems with chest pain or  shortness of breath.   PHYSICAL EXAMINATION:  VITAL SIGNS:  His blood pressure was 110/60, pulse  was 80.  LUNGS:  Clear.  HEART:  Sounds were regular.  EXTREMITIES:  Without edema.   Interrogation of his Guidant H175 device demonstrates an R-wave of 11 with  impedence of 549, threshold of 0.8 at 0.5.  P wave was 11 with impedance of  517 threshold of 0.4 at 0.5 and the LV amplitude was 16.5 with impedance of  686 and a threshold of 0.2 at 0.5.  There were no intercurrent episodes.   IMPRESSION:  1. Ischemic cardiomyopathy.  2. Chronic systolic heart failure.  3. Status post CRT for the above.  4. Diabetes.   [name] [name] is stable.  His Coreg dose remains at 12.5.  This may be up  titrated towards 25 and I will defer this to Dr. [name].   I will see him again as part of __________  CRT followup.     Duke Salvia, MD, [facility]  Electronically Signed    SCK/MedQ  DD: 07/07/2006  DT: 07/07/2006  Job #: [id number]   cc:   Colleen Can. [name], M.D.

## 2011-01-10 NOTE — Discharge Summary (Signed)
[facility]  Patient:    [name] [name], [name] [name]                  MRN: [id number] Adm. Date:  [id number] Disc. Date: [id number] Attending:  Laqueta Jean CC:         Hadassah Pais. Jeannetta Nap, M.D.   Discharge Summary  DISCHARGE DIAGNOSES: 1. Left upper ureteral stone with colic. 2. Diabetes. 3. Lipoid dystrophy.  OPERATIONS:  August 19, 2000, cystourethroscopy, left retrograde pyelogram, left double-J catheter.  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  In the emergency room the patient was found on abdominal CT to have an impacted left upper ureteral stone with hydronephrosis.  Because of his continued pain, nausea, vomiting, and elevated temperature, elevated white count of 10,600, it was decided to admit the patient for IV antibiotics, diabetes control.  PAST MEDICAL HISTORY:  Diabetes.  ALLERGIES:  None known.  MEDICATIONS: 1. Glucophage 500 mg 2 in the morning and 1 at night. 2. Diabinese 250 mg 2 in the morning with breakfast. 3. Lipitor 10 mg per day.  ALCOHOL/TOBACCO:  None.  PHYSICAL EXAMINATION:  Height 5 feet 9 inches, weight 218 pounds.  Temperature 100, pulse 94, respiratory rate 27, blood pressure 150/80.  Remainder of physical examination is as noted in August 19, 2000, H&P.  ADMISSION LABORATORY DATA:  White blood cell count 10,600, platelet count 213,000, hemoglobin 12.8, hematocrit 38.5.  The patient had 90% polymorphonucleocytes.  Sodium 142, potassium 3.8, chloride 107, CO2 28, BUN 14, creatinine 1.3.  Amylase 110, lipase 25.  Urinalysis shows dipstick negative x 2.  Blood cultures showed no growth in five days.  Urine culture shows: 1. E. coli, pansensitive. 2. Enterococcal species sensitive to penicillin, Levaquin, Macrobid, and    Septra.  HOSPITAL COURSE:  On August 20, 2000, the patient was taken to the operating room and underwent cystourethroscopy, left retrograde pyelogram, and left double-J catheter.  He was allowed to be discharged on August 20, 2000, in much-improved condition.  He will need to return for lithotripsy of a 7 mm impacted left upper ureteral calculus.  He is discharged in stable condition. DD:  08/31/00 TD:  09/01/00 Job: [id number]

## 2011-01-21 ENCOUNTER — Other Ambulatory Visit: Payer: Medicare Other | Admitting: *Deleted

## 2011-01-22 ENCOUNTER — Telehealth: Payer: Self-pay | Admitting: *Deleted

## 2011-01-22 NOTE — Telephone Encounter (Signed)
Labs reported to spouse 

## 2011-02-07 ENCOUNTER — Encounter: Payer: Medicare Other | Admitting: *Deleted

## 2011-02-10 ENCOUNTER — Ambulatory Visit (INDEPENDENT_AMBULATORY_CARE_PROVIDER_SITE_OTHER): Payer: Medicare Other | Admitting: *Deleted

## 2011-02-10 DIAGNOSIS — I2589 Other forms of chronic ischemic heart disease: Secondary | ICD-10-CM

## 2011-02-10 DIAGNOSIS — Z7901 Long term (current) use of anticoagulants: Secondary | ICD-10-CM

## 2011-02-10 DIAGNOSIS — I428 Other cardiomyopathies: Secondary | ICD-10-CM

## 2011-02-10 LAB — POCT INR: INR: 2.4

## 2011-02-11 ENCOUNTER — Other Ambulatory Visit: Payer: Self-pay | Admitting: Cardiology

## 2011-03-10 ENCOUNTER — Ambulatory Visit (INDEPENDENT_AMBULATORY_CARE_PROVIDER_SITE_OTHER): Payer: Medicare Other | Admitting: *Deleted

## 2011-03-10 ENCOUNTER — Encounter: Payer: Medicare Other | Admitting: *Deleted

## 2011-03-10 DIAGNOSIS — Z7901 Long term (current) use of anticoagulants: Secondary | ICD-10-CM

## 2011-03-10 DIAGNOSIS — I428 Other cardiomyopathies: Secondary | ICD-10-CM

## 2011-03-10 LAB — POCT INR: INR: 2.3

## 2011-03-27 ENCOUNTER — Other Ambulatory Visit: Payer: Self-pay | Admitting: Internal Medicine

## 2011-03-27 ENCOUNTER — Ambulatory Visit (INDEPENDENT_AMBULATORY_CARE_PROVIDER_SITE_OTHER): Payer: Medicare Other | Admitting: *Deleted

## 2011-03-27 DIAGNOSIS — I428 Other cardiomyopathies: Secondary | ICD-10-CM

## 2011-03-27 LAB — REMOTE ICD DEVICE
AL AMPLITUDE: 5.6 mv
AL IMPEDENCE ICD: 613 Ohm
BRDY-0002RA: 40 {beats}/min
CHARGE TIME: 8.6 s
DEVICE MODEL ICD: 587847
HV IMPEDENCE: 49 Ohm
LV LEAD IMPEDENCE ICD: 620 Ohm
RV LEAD IMPEDENCE ICD: 502 Ohm
TZAT-0001FASTVT: 1
TZAT-0013FASTVT: 2
TZAT-0013SLOWVT: 2
TZAT-0018FASTVT: NEGATIVE
TZAT-0018FASTVT: NEGATIVE
TZAT-0018SLOWVT: NEGATIVE
TZST-0001FASTVT: 3
TZST-0001FASTVT: 4
TZST-0001SLOWVT: 3
TZST-0001SLOWVT: 4
TZST-0001SLOWVT: 7
TZST-0003FASTVT: 26 J
TZST-0003FASTVT: 41 J
TZST-0003FASTVT: 41 J
TZST-0003SLOWVT: 41 J
TZST-0003SLOWVT: 41 J
TZST-0003SLOWVT: 41 J
VENTRICULAR PACING ICD: 100 pct

## 2011-04-01 ENCOUNTER — Encounter: Payer: Self-pay | Admitting: Nurse Practitioner

## 2011-04-01 ENCOUNTER — Other Ambulatory Visit: Payer: Self-pay | Admitting: *Deleted

## 2011-04-02 NOTE — Progress Notes (Signed)
icd remote check  

## 2011-04-08 ENCOUNTER — Ambulatory Visit (INDEPENDENT_AMBULATORY_CARE_PROVIDER_SITE_OTHER): Payer: Medicare Other | Admitting: Nurse Practitioner

## 2011-04-08 ENCOUNTER — Ambulatory Visit (INDEPENDENT_AMBULATORY_CARE_PROVIDER_SITE_OTHER): Payer: Medicare Other | Admitting: *Deleted

## 2011-04-08 ENCOUNTER — Encounter: Payer: Self-pay | Admitting: Nurse Practitioner

## 2011-04-08 VITALS — BP 142/80 | HR 72 | Wt 202.1 lb

## 2011-04-08 DIAGNOSIS — Z7901 Long term (current) use of anticoagulants: Secondary | ICD-10-CM

## 2011-04-08 DIAGNOSIS — I428 Other cardiomyopathies: Secondary | ICD-10-CM

## 2011-04-08 DIAGNOSIS — I10 Essential (primary) hypertension: Secondary | ICD-10-CM

## 2011-04-08 LAB — POCT INR: INR: 2.6

## 2011-04-08 NOTE — Assessment & Plan Note (Signed)
He has been maintained on his coumadin because of his cardiomyopathy. INR today is 2.6. We will continue with his same dose and recheck in 4 weeks. He has no adverse effects noted.

## 2011-04-08 NOTE — Patient Instructions (Addendum)
Stay on your current medicines. We will see you back in about 6 months. You will see Dr. Angelina Sheriff at your next visit.  I think you are doing well.  Stay on your same dose of Coumadin. We will recheck your level in 4 weeks. Call for any problems.

## 2011-04-08 NOTE — Assessment & Plan Note (Signed)
Blood pressure is ok. No change in medicines.

## 2011-04-08 NOTE — Progress Notes (Signed)
[name] [name] Date of Birth: [birth year]   History of Present Illness: [name] [name] is seen today for his 6 month check. He is seen for Dr. Antoine Poche. He is a former patient of Dr. Ronnald Nian. He has a nonischemic CM. He has his BiV ICD in place. He continues to do well. He denies chest pain, shortness of breath, or dizziness. No swelling or cough. No PND. No shocks. He has some left shoulder pain that is worse with movement. His sugars remain up and down. He is tolerating his medicines well.   Current Outpatient Prescriptions on File Prior to Visit  Medication Sig Dispense Refill  . aspirin 325 MG tablet Take 325 mg by mouth daily.        [name] atorvastatin (LIPITOR) 10 MG tablet Take 10 mg by mouth daily.        . carvedilol (COREG) 12.5 MG tablet Take 12.5 mg by mouth 3 (three) times daily. 1 tablet two times daily 1 1/2 tab at noon      . esomeprazole (NEXIUM) 40 MG capsule Take 40 mg by mouth as needed.        . furosemide (LASIX) 40 MG tablet Take 40 mg by mouth 2 (two) times daily.        [name] glipiZIDE (GLUCOTROL) 10 MG tablet Take 10 mg by mouth 2 (two) times daily before a meal.       . losartan (COZAAR) 100 MG tablet Take 1 tablet (100 mg total) by mouth daily.  30 tablet  11  . metFORMIN (GLUCOPHAGE) 1000 MG tablet Take 500 mg by mouth 2 (two) times daily with a meal.       . pantoprazole (PROTONIX) 40 MG tablet Take 40 mg by mouth daily.        . rosiglitazone (AVANDIA) 2 MG tablet Take 2 mg by mouth daily.        . simvastatin (ZOCOR) 40 MG tablet Take 40 mg by mouth at bedtime.        [name] spironolactone (ALDACTONE) 25 MG tablet TAKE 1/2 TABLET EVERY DAY  15 tablet  6  . DISCONTD: warfarin (COUMADIN) 10 MG tablet Take 5 mg by mouth as directed.         No Known Allergies  Past Medical History  Diagnosis Date  . Diabetes mellitus   . GERD (gastroesophageal reflux disease)   . Obesity   . Cardiomyopathy, nonischemic   . Chronic anticoagulation   . Hypertension   . ICD  (implantable cardiac defibrillator) in place     has BiV in place.     Past Surgical History  Procedure Date  . Cardiac catheterization 10/01/2001    THERE WAS GLOBAL HYPOKINESIS AND EF 25%. THERE APPEARED TO BE GLOBAL DECREASE IN WALL MOTION  . Lithotripsy 2001  . Cervical spine surgery 1994  . Cardiac defibrillator placement 06/2009    WITH GENERATOR REPLACED; BiV ICD  . US echocardiography 03/21/2008    EF 30-35%  . Cardiovascular stress test 03/20/2009    EF 33%    History  Smoking status  . Never Smoker   Smokeless tobacco  . Not on file    History  Alcohol Use No    Family History  Problem Relation Age of Onset  . Heart disease Mother   . Hypertension Mother   . Diabetes Mother   . Diabetes Father     Review of Systems: The review of systems is positive for some arthritis pain in  his shoulder. He had lipids done in May.  All other systems were reviewed and are negative.  Physical Exam: BP 142/80  Pulse 72  Wt 202 lb 2 oz (91.683 kg) Patient is very pleasant and in no acute distress. Skin is warm and dry. Color is normal.  HEENT is unremarkable. Normocephalic/atraumatic. PERRL. Sclera are nonicteric. Neck is supple. No masses. No JVD. Lungs are clear. Cardiac exam shows a regular rate and rhythm. No S3.  Abdomen is mildly obese and soft. Extremities are without edema. Gait and ROM are intact. No gross neurologic deficits noted.  LABORATORY DATA:    Assessment / Plan:

## 2011-04-08 NOTE — Assessment & Plan Note (Signed)
He remains asymptomatic. He is on a good medical regimen. Last EF was per nuclear in 2010 and was 35%. I think he is doing well. Will see him back in 6 months. He will see Dr. Antoine Poche at that time. Patient is agreeable to this plan and will call if any problems develop in the interim.

## 2011-04-16 ENCOUNTER — Encounter: Payer: Self-pay | Admitting: *Deleted

## 2011-05-04 ENCOUNTER — Other Ambulatory Visit: Payer: Self-pay | Admitting: Cardiology

## 2011-05-06 ENCOUNTER — Ambulatory Visit (INDEPENDENT_AMBULATORY_CARE_PROVIDER_SITE_OTHER): Payer: Medicare Other | Admitting: *Deleted

## 2011-05-06 DIAGNOSIS — Z7901 Long term (current) use of anticoagulants: Secondary | ICD-10-CM

## 2011-05-06 DIAGNOSIS — I428 Other cardiomyopathies: Secondary | ICD-10-CM

## 2011-05-07 ENCOUNTER — Other Ambulatory Visit: Payer: Self-pay | Admitting: *Deleted

## 2011-05-07 MED ORDER — CARVEDILOL 12.5 MG PO TABS: ORAL_TABLET | ORAL | Status: DC

## 2011-05-08 ENCOUNTER — Telehealth: Payer: Self-pay | Admitting: Nurse Practitioner

## 2011-05-08 NOTE — Telephone Encounter (Signed)
Pt needs refill on Carvedilol 12.5 mg.  Pt is completely out.  Pharmacy is [facility] on [id number].   Please call pt back to confirm this has been done.  Chart in box.

## 2011-05-08 NOTE — Telephone Encounter (Signed)
Called [name] [name] to let him know his prescription has already been refilled, and ready for him to pick up. Patient is aware and will go pick up prescription.

## 2011-05-17 ENCOUNTER — Other Ambulatory Visit: Payer: Self-pay | Admitting: Cardiology

## 2011-06-03 ENCOUNTER — Encounter: Payer: Medicare Other | Admitting: *Deleted

## 2011-06-04 ENCOUNTER — Encounter: Payer: Medicare Other | Admitting: *Deleted

## 2011-06-04 ENCOUNTER — Ambulatory Visit (INDEPENDENT_AMBULATORY_CARE_PROVIDER_SITE_OTHER): Payer: Medicare Other | Admitting: *Deleted

## 2011-06-04 DIAGNOSIS — I428 Other cardiomyopathies: Secondary | ICD-10-CM

## 2011-06-04 DIAGNOSIS — Z7901 Long term (current) use of anticoagulants: Secondary | ICD-10-CM

## 2011-06-19 ENCOUNTER — Encounter: Payer: Self-pay | Admitting: Internal Medicine

## 2011-06-19 ENCOUNTER — Ambulatory Visit (INDEPENDENT_AMBULATORY_CARE_PROVIDER_SITE_OTHER): Payer: Medicare Other | Admitting: Internal Medicine

## 2011-06-19 DIAGNOSIS — I4891 Unspecified atrial fibrillation: Secondary | ICD-10-CM

## 2011-06-19 DIAGNOSIS — I5022 Chronic systolic (congestive) heart failure: Secondary | ICD-10-CM

## 2011-06-19 DIAGNOSIS — Z95810 Presence of automatic (implantable) cardiac defibrillator: Secondary | ICD-10-CM

## 2011-06-19 DIAGNOSIS — I428 Other cardiomyopathies: Secondary | ICD-10-CM

## 2011-06-19 LAB — ICD DEVICE OBSERVATION
AL AMPLITUDE: 5.6 mv
AL IMPEDENCE ICD: 657 Ohm
AL THRESHOLD: 0.4 V
DEVICE MODEL ICD: 587847
LV LEAD THRESHOLD: 1 V
RV LEAD THRESHOLD: 0.8 V
TZAT-0001FASTVT: 1
TZAT-0001FASTVT: 2
TZAT-0001SLOWVT: 2
TZAT-0013FASTVT: 2
TZAT-0013SLOWVT: 2
TZAT-0018FASTVT: NEGATIVE
TZAT-0018SLOWVT: NEGATIVE
TZST-0001FASTVT: 4
TZST-0001FASTVT: 5
TZST-0001FASTVT: 8
TZST-0001SLOWVT: 4
TZST-0001SLOWVT: 7
TZST-0003FASTVT: 41 J
TZST-0003FASTVT: 41 J
TZST-0003FASTVT: 41 J
TZST-0003SLOWVT: 41 J
TZST-0003SLOWVT: 41 J
TZST-0003SLOWVT: 41 J

## 2011-06-19 MED ORDER — CARVEDILOL 25 MG PO TABS: 25.0000 mg | ORAL_TABLET | Freq: Two times a day (BID) | ORAL | Status: DC

## 2011-06-19 NOTE — Progress Notes (Signed)
  HPI  [name] [name] is a 65 y.o. male seen as a followup as per MADIT-CRT trial implanted for a nonischemic cardiomyopathy with a CRT-D device. He is status post generator replacement    He is doing well. He is chopping wood he has no complaints of chest pain or shortness of breath.    Past Medical History  Diagnosis Date  . Diabetes mellitus   . GERD (gastroesophageal reflux disease)   . Obesity   . Cardiomyopathy, nonischemic   . Chronic anticoagulation   . Hypertension   . ICD (implantable cardiac defibrillator) in place     has BiV in place.     Past Surgical History  Procedure Date  . Cardiac catheterization 10/01/2001    THERE WAS GLOBAL HYPOKINESIS AND EF 25%. THERE APPEARED TO BE GLOBAL DECREASE IN WALL MOTION  . Lithotripsy 2001  . Cervical spine surgery 1994  . Cardiac defibrillator placement 06/2009    WITH GENERATOR REPLACED; BiV ICD  . US echocardiography 03/21/2008    EF 30-35%  . Cardiovascular stress test 03/20/2009    EF 33%    Current Outpatient Prescriptions  Medication Sig Dispense Refill  . aspirin 325 MG tablet Take 325 mg by mouth daily.        [name] atorvastatin (LIPITOR) 10 MG tablet Take 10 mg by mouth daily.        . carvedilol (COREG) 12.5 MG tablet Take one and one half tablets bid       . esomeprazole (NEXIUM) 40 MG capsule Take 40 mg by mouth as needed.        . furosemide (LASIX) 40 MG tablet TAKE 1 TABLET TWICE DAILY  60 tablet  6  . glipiZIDE (GLUCOTROL) 10 MG tablet Take 10 mg by mouth 2 (two) times daily before a meal.       . losartan (COZAAR) 100 MG tablet Take 1 tablet (100 mg total) by mouth daily.  30 tablet  11  . metFORMIN (GLUCOPHAGE) 1000 MG tablet Take 500 mg by mouth 2 (two) times daily with a meal.       . pantoprazole (PROTONIX) 40 MG tablet Take 40 mg by mouth daily.        . rosiglitazone (AVANDIA) 2 MG tablet Take 2 mg by mouth daily.        . simvastatin (ZOCOR) 40 MG tablet Take 40 mg by mouth at bedtime.        [name]  spironolactone (ALDACTONE) 25 MG tablet TAKE 1/2 TABLET EVERY DAY  15 tablet  6  . warfarin (COUMADIN) 5 MG tablet Take 5 mg by mouth daily. Take as directed        . DISCONTD: carvedilol (COREG) 12.5 MG tablet 1 tablet two times daily 1 1/2 tab at noon  105 tablet  3    No Known Allergies  Review of Systems negative except from HPI and PMH  Physical Exam Well developed and well nourished in no acute distress HENT normal E scleral and icterus clear Neck Supple JVP flat; carotids brisk and full Clear to ausculation Regular rate and rhythm, 2/6 murmur at the left lower sternal border; S4 Soft with active bowel sounds No clubbing cyanosis and edema Alert and oriented, grossly normal motor and sensory function Skin Warm and Dry    Assessment and  Plan

## 2011-06-19 NOTE — Assessment & Plan Note (Signed)
Well controlled 

## 2011-06-19 NOTE — Assessment & Plan Note (Signed)
Stable; medication adjustments as listed

## 2011-06-19 NOTE — Patient Instructions (Signed)
Your physician has recommended you make the following change in your medication:  1) Stop Aspirin. 2) Increase Coreg (carvedilol) to 25mg  one tablet by mouth twice daily.  Remote monitoring is used to monitor your Pacemaker of ICD from home. This monitoring reduces the number of office visits required to check your device to one time per year. It allows [place] to keep an eye on the functioning of your device to ensure it is working properly. You are scheduled for a device check from home on 09/18/11. You may send your transmission at any time that day. If you have a wireless device, the transmission will be sent automatically. After your physician reviews your transmission, you will receive a postcard with your next transmission date.   Your physician wants you to follow-up in: 1 year with Dr. Graciela Husbands. You will receive a reminder letter in the mail two months in advance. If you don't receive a letter, please call our office to schedule the follow-up appointment.

## 2011-06-19 NOTE — Assessment & Plan Note (Signed)
As above.

## 2011-06-19 NOTE — Assessment & Plan Note (Signed)
The patient's device was interrogated and the information was fully reviewed.  The device was reprogrammed to minimize the likelihood of inappropriate therapy

## 2011-06-19 NOTE — Assessment & Plan Note (Signed)
He has had infrequent episodes of  atrial fibrillation associated with a rapid ventricular response that actually intruded into a ventricular tachycardia zone. It was associated with incomplete resynchronization and detection of VT.  We have reprogrammed his device to try to reduce the likelihood of inappropriate therapy.  We'll discontinue his aspirin.  We'll increase his carvedilol from 18.75- 25 twice daily

## 2011-07-02 ENCOUNTER — Ambulatory Visit (INDEPENDENT_AMBULATORY_CARE_PROVIDER_SITE_OTHER): Payer: Medicare Other | Admitting: *Deleted

## 2011-07-02 DIAGNOSIS — I428 Other cardiomyopathies: Secondary | ICD-10-CM

## 2011-07-02 DIAGNOSIS — Z7901 Long term (current) use of anticoagulants: Secondary | ICD-10-CM

## 2011-07-02 LAB — POCT INR: INR: 2.4

## 2011-07-13 ENCOUNTER — Other Ambulatory Visit: Payer: Self-pay | Admitting: Cardiology

## 2011-07-15 ENCOUNTER — Encounter: Payer: Self-pay | Admitting: *Deleted

## 2011-08-13 ENCOUNTER — Encounter: Payer: Medicare Other | Admitting: *Deleted

## 2011-08-18 ENCOUNTER — Ambulatory Visit (INDEPENDENT_AMBULATORY_CARE_PROVIDER_SITE_OTHER): Payer: Medicare Other | Admitting: *Deleted

## 2011-08-18 DIAGNOSIS — I428 Other cardiomyopathies: Secondary | ICD-10-CM

## 2011-08-18 DIAGNOSIS — Z7901 Long term (current) use of anticoagulants: Secondary | ICD-10-CM

## 2011-08-18 LAB — POCT INR: INR: 2.1

## 2011-09-16 ENCOUNTER — Other Ambulatory Visit: Payer: Self-pay | Admitting: Cardiology

## 2011-09-16 NOTE — Telephone Encounter (Signed)
New Problem: ° ° ° °Patient called in needing a refill of his warfarin (COUMADIN) 5 MG tablet. °

## 2011-09-18 ENCOUNTER — Encounter: Payer: Medicare Other | Admitting: *Deleted

## 2011-09-19 ENCOUNTER — Other Ambulatory Visit: Payer: Self-pay | Admitting: Internal Medicine

## 2011-09-22 ENCOUNTER — Encounter: Payer: Self-pay | Admitting: *Deleted

## 2011-09-25 ENCOUNTER — Ambulatory Visit (INDEPENDENT_AMBULATORY_CARE_PROVIDER_SITE_OTHER): Payer: Medicare Other | Admitting: *Deleted

## 2011-09-25 ENCOUNTER — Encounter: Payer: Self-pay | Admitting: Internal Medicine

## 2011-09-25 DIAGNOSIS — Z95810 Presence of automatic (implantable) cardiac defibrillator: Secondary | ICD-10-CM

## 2011-09-25 DIAGNOSIS — I4891 Unspecified atrial fibrillation: Secondary | ICD-10-CM

## 2011-09-27 LAB — REMOTE ICD DEVICE
AL AMPLITUDE: 5.5 mv
AL IMPEDENCE ICD: 725 Ohm
ATRIAL PACING ICD: 0 pct
BRDY-0002RA: 40 {beats}/min
CHARGE TIME: 8.7 s
LV LEAD IMPEDENCE ICD: 605 Ohm
TOT-0006: 20121101000000
TZAT-0001FASTVT: 1
TZAT-0013FASTVT: 2
TZAT-0013SLOWVT: 2
TZAT-0018FASTVT: NEGATIVE
TZAT-0018FASTVT: NEGATIVE
TZAT-0018SLOWVT: NEGATIVE
TZON-0003SLOWVT: 342.9 ms
TZST-0001FASTVT: 3
TZST-0001FASTVT: 4
TZST-0001FASTVT: 7
TZST-0001FASTVT: 8
TZST-0001SLOWVT: 3
TZST-0001SLOWVT: 6
TZST-0001SLOWVT: 7
TZST-0003FASTVT: 41 J
TZST-0003FASTVT: 41 J
TZST-0003SLOWVT: 23 J
TZST-0003SLOWVT: 41 J
TZST-0003SLOWVT: 41 J
VENTRICULAR PACING ICD: 100 pct
VF: 0

## 2011-09-29 ENCOUNTER — Ambulatory Visit (INDEPENDENT_AMBULATORY_CARE_PROVIDER_SITE_OTHER): Payer: Medicare Other | Admitting: *Deleted

## 2011-09-29 DIAGNOSIS — I428 Other cardiomyopathies: Secondary | ICD-10-CM

## 2011-09-29 DIAGNOSIS — Z7901 Long term (current) use of anticoagulants: Secondary | ICD-10-CM

## 2011-09-29 NOTE — Patient Instructions (Signed)
Patient request 4 week check, he understands risks of anticoagulant therapy, nurse suggested 3 week

## 2011-10-01 NOTE — Progress Notes (Signed)
Remote defib check  

## 2011-10-20 ENCOUNTER — Other Ambulatory Visit: Payer: Self-pay | Admitting: *Deleted

## 2011-10-20 MED ORDER — SPIRONOLACTONE 25 MG PO TABS: 12.5000 mg | ORAL_TABLET | Freq: Every day | ORAL | Status: DC

## 2011-10-28 ENCOUNTER — Ambulatory Visit (INDEPENDENT_AMBULATORY_CARE_PROVIDER_SITE_OTHER): Payer: Medicare Other | Admitting: *Deleted

## 2011-10-28 DIAGNOSIS — I428 Other cardiomyopathies: Secondary | ICD-10-CM

## 2011-10-28 DIAGNOSIS — Z7901 Long term (current) use of anticoagulants: Secondary | ICD-10-CM

## 2011-10-29 ENCOUNTER — Other Ambulatory Visit: Payer: Self-pay | Admitting: Cardiology

## 2011-10-30 ENCOUNTER — Telehealth: Payer: Self-pay

## 2011-10-30 MED ORDER — SPIRONOLACTONE 25 MG PO TABS: 12.5000 mg | ORAL_TABLET | Freq: Every day | ORAL | Status: DC

## 2011-10-30 NOTE — Telephone Encounter (Signed)
Patient called wanting refill for aldactone 25 mg.Aldactone 25 mg refill sent to [facility] [address].

## 2011-11-05 ENCOUNTER — Encounter: Payer: Self-pay | Admitting: Internal Medicine

## 2011-11-09 ENCOUNTER — Other Ambulatory Visit: Payer: Self-pay | Admitting: Cardiology

## 2011-12-02 ENCOUNTER — Ambulatory Visit (INDEPENDENT_AMBULATORY_CARE_PROVIDER_SITE_OTHER): Payer: Medicare Other | Admitting: *Deleted

## 2011-12-02 DIAGNOSIS — I428 Other cardiomyopathies: Secondary | ICD-10-CM

## 2011-12-02 DIAGNOSIS — Z7901 Long term (current) use of anticoagulants: Secondary | ICD-10-CM

## 2011-12-14 ENCOUNTER — Other Ambulatory Visit: Payer: Self-pay | Admitting: Cardiology

## 2011-12-18 ENCOUNTER — Other Ambulatory Visit: Payer: Self-pay | Admitting: Internal Medicine

## 2011-12-18 ENCOUNTER — Other Ambulatory Visit: Payer: Self-pay | Admitting: Nurse Practitioner

## 2011-12-18 MED ORDER — SPIRONOLACTONE 25 MG PO TABS: 12.5000 mg | ORAL_TABLET | Freq: Every day | ORAL | Status: DC

## 2011-12-18 NOTE — Telephone Encounter (Signed)
Out of pills 

## 2011-12-22 ENCOUNTER — Other Ambulatory Visit: Payer: Self-pay | Admitting: *Deleted

## 2011-12-22 MED ORDER — SIMVASTATIN 40 MG PO TABS: 40.0000 mg | ORAL_TABLET | Freq: Every day | ORAL | Status: DC

## 2011-12-25 ENCOUNTER — Ambulatory Visit (INDEPENDENT_AMBULATORY_CARE_PROVIDER_SITE_OTHER): Payer: Medicare Other | Admitting: *Deleted

## 2011-12-25 ENCOUNTER — Encounter: Payer: Self-pay | Admitting: Internal Medicine

## 2011-12-25 DIAGNOSIS — I428 Other cardiomyopathies: Secondary | ICD-10-CM

## 2011-12-30 LAB — REMOTE ICD DEVICE
AL AMPLITUDE: 4.9 mv
AL IMPEDENCE ICD: 702 Ohm
BRDY-0002RA: 40 {beats}/min
BRDY-0003RA: 130 {beats}/min
CHARGE TIME: 8.7 s
DEVICE MODEL ICD: 587847
HV IMPEDENCE: 47 Ohm
LV LEAD IMPEDENCE ICD: 604 Ohm
RV LEAD IMPEDENCE ICD: 493 Ohm
TOT-0006: 20130131000000
TZAT-0001FASTVT: 2
TZAT-0001SLOWVT: 1
TZAT-0013FASTVT: 2
TZAT-0013FASTVT: 2
TZAT-0013SLOWVT: 2
TZAT-0018SLOWVT: NEGATIVE
TZAT-0018SLOWVT: NEGATIVE
TZON-0003FASTVT: 300 ms
TZST-0001FASTVT: 4
TZST-0001FASTVT: 5
TZST-0001FASTVT: 6
TZST-0001FASTVT: 7
TZST-0001SLOWVT: 3
TZST-0001SLOWVT: 4
TZST-0001SLOWVT: 7
TZST-0003FASTVT: 26 J
TZST-0003FASTVT: 41 J
TZST-0003FASTVT: 41 J
TZST-0003SLOWVT: 23 J
TZST-0003SLOWVT: 41 J
TZST-0003SLOWVT: 41 J

## 2012-01-02 ENCOUNTER — Other Ambulatory Visit: Payer: Self-pay | Admitting: Nurse Practitioner

## 2012-01-02 NOTE — Progress Notes (Signed)
ICD remote 

## 2012-01-06 ENCOUNTER — Ambulatory Visit (INDEPENDENT_AMBULATORY_CARE_PROVIDER_SITE_OTHER): Payer: Medicare Other | Admitting: *Deleted

## 2012-01-06 DIAGNOSIS — I428 Other cardiomyopathies: Secondary | ICD-10-CM

## 2012-01-06 DIAGNOSIS — Z7901 Long term (current) use of anticoagulants: Secondary | ICD-10-CM

## 2012-01-12 ENCOUNTER — Other Ambulatory Visit: Payer: Self-pay | Admitting: Internal Medicine

## 2012-01-20 ENCOUNTER — Other Ambulatory Visit: Payer: Self-pay | Admitting: Cardiology

## 2012-02-17 ENCOUNTER — Ambulatory Visit (INDEPENDENT_AMBULATORY_CARE_PROVIDER_SITE_OTHER): Payer: Medicare Other | Admitting: *Deleted

## 2012-02-17 DIAGNOSIS — I428 Other cardiomyopathies: Secondary | ICD-10-CM

## 2012-02-17 DIAGNOSIS — Z7901 Long term (current) use of anticoagulants: Secondary | ICD-10-CM

## 2012-02-20 ENCOUNTER — Telehealth: Payer: Self-pay | Admitting: Internal Medicine

## 2012-02-20 NOTE — Telephone Encounter (Signed)
New Problem:    Patient had a question about his intake of greens.  Please call back and feel free to leave a message.

## 2012-02-23 NOTE — Telephone Encounter (Signed)
Spoke with pt.  He was confused over his last INR reading and increasing greens.  He thought his blood was "thick" and did not want to eat extra greens because it would make it "thicker".  Explained his INR was 3.7, which was thin so it is okay to eat a few extra greens.  He understands.

## 2012-03-11 ENCOUNTER — Other Ambulatory Visit: Payer: Self-pay | Admitting: Internal Medicine

## 2012-03-11 NOTE — Telephone Encounter (Signed)
Pt needs appointment then refill can be made Fax Received. Refill Completed. Trellis Vanoverbeke Chowoe (R.M.A)   

## 2012-03-17 ENCOUNTER — Ambulatory Visit (INDEPENDENT_AMBULATORY_CARE_PROVIDER_SITE_OTHER): Payer: Medicare Other | Admitting: *Deleted

## 2012-03-17 DIAGNOSIS — I428 Other cardiomyopathies: Secondary | ICD-10-CM

## 2012-03-17 DIAGNOSIS — Z7901 Long term (current) use of anticoagulants: Secondary | ICD-10-CM

## 2012-04-01 ENCOUNTER — Ambulatory Visit (INDEPENDENT_AMBULATORY_CARE_PROVIDER_SITE_OTHER): Payer: Medicare Other | Admitting: *Deleted

## 2012-04-01 ENCOUNTER — Encounter: Payer: Self-pay | Admitting: Internal Medicine

## 2012-04-01 DIAGNOSIS — I5022 Chronic systolic (congestive) heart failure: Secondary | ICD-10-CM

## 2012-04-01 DIAGNOSIS — Z95810 Presence of automatic (implantable) cardiac defibrillator: Secondary | ICD-10-CM

## 2012-04-04 LAB — REMOTE ICD DEVICE
AL AMPLITUDE: 4.5 mv
AL IMPEDENCE ICD: 669 Ohm
ATRIAL PACING ICD: 0 pct
BRDY-0002RA: 40 {beats}/min
BRDY-0003RA: 130 {beats}/min
CHARGE TIME: 8.8 s
DEV-0020ICD: NEGATIVE
DEVICE MODEL ICD: 587847
FVT: 0
HV IMPEDENCE: 48 Ohm
LV LEAD IMPEDENCE ICD: 625 Ohm
PACEART VT: 0
RV LEAD IMPEDENCE ICD: 548 Ohm
TOT-0006: 20130502000000
TZAT-0001FASTVT: 1
TZAT-0001FASTVT: 2
TZAT-0001SLOWVT: 1
TZAT-0001SLOWVT: 2
TZAT-0013FASTVT: 2
TZAT-0013FASTVT: 2
TZAT-0013SLOWVT: 2
TZAT-0013SLOWVT: 2
TZAT-0018FASTVT: NEGATIVE
TZAT-0018FASTVT: NEGATIVE
TZAT-0018SLOWVT: NEGATIVE
TZAT-0018SLOWVT: NEGATIVE
TZON-0003FASTVT: 300 ms
TZON-0003SLOWVT: 342.9 ms
TZST-0001FASTVT: 3
TZST-0001FASTVT: 4
TZST-0001FASTVT: 5
TZST-0001FASTVT: 6
TZST-0001FASTVT: 7
TZST-0001FASTVT: 8
TZST-0001SLOWVT: 3
TZST-0001SLOWVT: 4
TZST-0001SLOWVT: 5
TZST-0001SLOWVT: 6
TZST-0001SLOWVT: 7
TZST-0003FASTVT: 26 J
TZST-0003FASTVT: 41 J
TZST-0003FASTVT: 41 J
TZST-0003FASTVT: 41 J
TZST-0003FASTVT: 41 J
TZST-0003FASTVT: 41 J
TZST-0003SLOWVT: 23 J
TZST-0003SLOWVT: 41 J
TZST-0003SLOWVT: 41 J
TZST-0003SLOWVT: 41 J
TZST-0003SLOWVT: 41 J
VENTRICULAR PACING ICD: 100 pct
VF: 0

## 2012-04-08 ENCOUNTER — Encounter: Payer: Self-pay | Admitting: *Deleted

## 2012-04-13 ENCOUNTER — Ambulatory Visit (INDEPENDENT_AMBULATORY_CARE_PROVIDER_SITE_OTHER): Payer: Medicare Other

## 2012-04-13 DIAGNOSIS — I428 Other cardiomyopathies: Secondary | ICD-10-CM

## 2012-04-13 DIAGNOSIS — Z7901 Long term (current) use of anticoagulants: Secondary | ICD-10-CM

## 2012-04-13 LAB — POCT INR: INR: 1.8

## 2012-05-12 ENCOUNTER — Ambulatory Visit (INDEPENDENT_AMBULATORY_CARE_PROVIDER_SITE_OTHER): Payer: Medicare Other | Admitting: *Deleted

## 2012-05-12 DIAGNOSIS — I428 Other cardiomyopathies: Secondary | ICD-10-CM

## 2012-05-12 DIAGNOSIS — Z7901 Long term (current) use of anticoagulants: Secondary | ICD-10-CM

## 2012-05-12 LAB — POCT INR: INR: 2.7

## 2012-05-18 ENCOUNTER — Other Ambulatory Visit: Payer: Self-pay | Admitting: Cardiology

## 2012-05-18 NOTE — Telephone Encounter (Signed)
Please refill.

## 2012-06-09 ENCOUNTER — Ambulatory Visit (INDEPENDENT_AMBULATORY_CARE_PROVIDER_SITE_OTHER): Payer: Medicare Other | Admitting: Pharmacist

## 2012-06-09 DIAGNOSIS — Z7901 Long term (current) use of anticoagulants: Secondary | ICD-10-CM

## 2012-06-09 DIAGNOSIS — I428 Other cardiomyopathies: Secondary | ICD-10-CM

## 2012-06-09 DIAGNOSIS — I4891 Unspecified atrial fibrillation: Secondary | ICD-10-CM

## 2012-06-19 ENCOUNTER — Other Ambulatory Visit: Payer: Self-pay | Admitting: Internal Medicine

## 2012-07-09 ENCOUNTER — Ambulatory Visit (INDEPENDENT_AMBULATORY_CARE_PROVIDER_SITE_OTHER): Payer: Medicare Other

## 2012-07-09 DIAGNOSIS — Z7901 Long term (current) use of anticoagulants: Secondary | ICD-10-CM

## 2012-07-09 DIAGNOSIS — I428 Other cardiomyopathies: Secondary | ICD-10-CM

## 2012-07-09 DIAGNOSIS — I4891 Unspecified atrial fibrillation: Secondary | ICD-10-CM

## 2012-07-20 ENCOUNTER — Encounter: Payer: Self-pay | Admitting: Internal Medicine

## 2012-07-20 ENCOUNTER — Ambulatory Visit (INDEPENDENT_AMBULATORY_CARE_PROVIDER_SITE_OTHER): Payer: Medicare Other | Admitting: Internal Medicine

## 2012-07-20 ENCOUNTER — Encounter: Payer: Medicare Other | Admitting: Internal Medicine

## 2012-07-20 VITALS — BP 140/79 | HR 60 | Ht 69.0 in | Wt 204.0 lb

## 2012-07-20 DIAGNOSIS — I4891 Unspecified atrial fibrillation: Secondary | ICD-10-CM

## 2012-07-20 DIAGNOSIS — Z95810 Presence of automatic (implantable) cardiac defibrillator: Secondary | ICD-10-CM

## 2012-07-20 DIAGNOSIS — I5022 Chronic systolic (congestive) heart failure: Secondary | ICD-10-CM

## 2012-07-20 DIAGNOSIS — I428 Other cardiomyopathies: Secondary | ICD-10-CM

## 2012-07-20 LAB — ICD DEVICE OBSERVATION
AL AMPLITUDE: 6.2 mv
AL THRESHOLD: 0.5 V
ATRIAL PACING ICD: 1 pct
CHARGE TIME: 8.8 s
DEV-0020ICD: NEGATIVE
RV LEAD AMPLITUDE: 8.3 mv
RV LEAD THRESHOLD: 0.8 V
TZAT-0001FASTVT: 1
TZAT-0001FASTVT: 2
TZAT-0001SLOWVT: 1
TZAT-0013FASTVT: 2
TZAT-0013SLOWVT: 2
TZAT-0018FASTVT: NEGATIVE
TZAT-0018SLOWVT: NEGATIVE
TZAT-0018SLOWVT: NEGATIVE
TZST-0001FASTVT: 4
TZST-0001FASTVT: 5
TZST-0001FASTVT: 7
TZST-0001SLOWVT: 3
TZST-0001SLOWVT: 4
TZST-0001SLOWVT: 7
TZST-0003FASTVT: 26 J
TZST-0003FASTVT: 41 J
TZST-0003FASTVT: 41 J
TZST-0003SLOWVT: 23 J
TZST-0003SLOWVT: 41 J
TZST-0003SLOWVT: 41 J

## 2012-07-20 NOTE — Progress Notes (Signed)
Patient Care Team: [name], MD as PCP - General (Family Medicine)   HPI  [facility] is a 66 y.o. male seen as a followup as per MADIT-CRT trial implanted for a nonischemic cardiomyopathy with a CRT-D device. He is status post generator replacement  He is doing well.   He has significant peripheral edema which he thinks is relatively acute.  A  Past Medical History  Diagnosis Date  . Diabetes mellitus   . GERD (gastroesophageal reflux disease)   . Obesity   . Cardiomyopathy, nonischemic   . Chronic anticoagulation   . Hypertension   . ICD (implantable cardiac defibrillator) in place     has BiV in place.     Past Surgical History  Procedure Date  . Cardiac catheterization 10/01/2001    THERE WAS GLOBAL HYPOKINESIS AND EF 25%. THERE APPEARED TO BE GLOBAL DECREASE IN WALL MOTION  . Lithotripsy 2001  . Cervical spine surgery 1994  . Cardiac defibrillator placement 06/2009    WITH GENERATOR REPLACED; BiV ICD  . US echocardiography 03/21/2008    EF 30-35%  . Cardiovascular stress test 03/20/2009    EF 33%    Current Outpatient Prescriptions  Medication Sig Dispense Refill  . aspirin 325 MG tablet Take 325 mg by mouth daily.        [name] atorvastatin (LIPITOR) 10 MG tablet Take 10 mg by mouth daily.        . carvedilol (COREG) 12.5 MG tablet TAKE 1 TABLET TWO TIMES DAILY & 1 & 1/2 TABLET AT NOON  105 tablet  1  . esomeprazole (NEXIUM) 40 MG capsule Take 40 mg by mouth as needed.        . furosemide (LASIX) 40 MG tablet TAKE 1 TABLET TWICE DAILY  60 tablet  6  . insulin detemir (LEVEMIR) 100 UNIT/ML injection Inject into the skin at bedtime. As directed      . losartan (COZAAR) 100 MG tablet TAKE 1 TABLET BY MOUTH EVERY DAY  30 tablet  4  . metFORMIN (GLUCOPHAGE) 1000 MG tablet Take 500 mg by mouth 2 (two) times daily with a meal.       . pantoprazole (PROTONIX) 40 MG tablet Take 40 mg by mouth daily.        . rosiglitazone (AVANDIA) 2 MG tablet Take 2 mg by  mouth daily.        . simvastatin (ZOCOR) 40 MG tablet Take 1 tablet (40 mg total) by mouth at bedtime.  30 tablet  11  . spironolactone (ALDACTONE) 25 MG tablet Take 0.5 tablets (12.5 mg total) by mouth daily.  90 tablet  0  . warfarin (COUMADIN) 5 MG tablet TAKE AS DIRECTED BY ANTICOAGULATION CLINIC  75 tablet  3  . [DISCONTINUED] carvedilol (COREG) 12.5 MG tablet As directed  105 tablet  3    No Known Allergies  Review of Systems negative except from HPI and PMH  Physical Exam BP 140/79  Pulse 60  Ht 5\' 9"  (1.753 m)  Wt 204 lb (92.534 kg)  BMI 30.13 kg/m2 Well developed and well nourished in no acute distress HENT normal E scleral and icterus clear Neck Supple JVP 6-8;  Clear to ausculation  egular rate and rhythm, n  Soft with active bowel sounds No clubbing cyanosis 2+ Edema Alert and oriented, grossly normal motor and sensory function Skin Warm and Dry    Assessment and  Plan

## 2012-07-20 NOTE — Assessment & Plan Note (Signed)
On Guideline directed medical therapy Currently class 1-2

## 2012-07-20 NOTE — Assessment & Plan Note (Signed)
No intercurrent atrial fibrillation; on warfarin.  We will stop his aspirin

## 2012-07-20 NOTE — Assessment & Plan Note (Signed)
The patient's device was interrogated.  The information was reviewed. No changes were made in the programming.    

## 2012-07-20 NOTE — Patient Instructions (Addendum)
Your physician wants you to follow-up in: 1 year with Dr. Graciela Husbands. You will receive a reminder letter in the mail two months in advance. If you don't receive a letter, please call our office to schedule the follow-up appointment.  Remote monitoring is used to monitor your Pacemaker of ICD from home. This monitoring reduces the number of office visits required to check your device to one time per year. It allows [place] to keep an eye on the functioning of your device to ensure it is working properly. You are scheduled for a device check from home on 10/28/2012. You may send your transmission at any time that day. If you have a wireless device, the transmission will be sent automatically. After your physician reviews your transmission, you will receive a postcard with your next transmission date.

## 2012-07-20 NOTE — Assessment & Plan Note (Signed)
There is evidence of volume overload. This may be related to his Avandia. I last Dr. Jeannetta Nap to reconsider putting him back on metformin. They're increasing dyspnea demonstrating the safety of metformin in patients with cardiomyopathy although this was previously a concern

## 2012-07-23 ENCOUNTER — Encounter: Payer: Medicare Other | Admitting: Internal Medicine

## 2012-07-25 ENCOUNTER — Other Ambulatory Visit: Payer: Self-pay | Admitting: Internal Medicine

## 2012-08-11 ENCOUNTER — Other Ambulatory Visit: Payer: Medicare Other

## 2012-08-11 ENCOUNTER — Ambulatory Visit (INDEPENDENT_AMBULATORY_CARE_PROVIDER_SITE_OTHER): Payer: Medicare Other | Admitting: *Deleted

## 2012-08-11 DIAGNOSIS — I4891 Unspecified atrial fibrillation: Secondary | ICD-10-CM

## 2012-08-11 DIAGNOSIS — Z7901 Long term (current) use of anticoagulants: Secondary | ICD-10-CM

## 2012-08-16 ENCOUNTER — Other Ambulatory Visit: Payer: Self-pay | Admitting: Nurse Practitioner

## 2012-09-08 ENCOUNTER — Ambulatory Visit (INDEPENDENT_AMBULATORY_CARE_PROVIDER_SITE_OTHER): Payer: Medicare Other | Admitting: *Deleted

## 2012-09-08 DIAGNOSIS — Z7901 Long term (current) use of anticoagulants: Secondary | ICD-10-CM

## 2012-09-08 DIAGNOSIS — I428 Other cardiomyopathies: Secondary | ICD-10-CM

## 2012-09-08 DIAGNOSIS — I4891 Unspecified atrial fibrillation: Secondary | ICD-10-CM

## 2012-09-26 ENCOUNTER — Other Ambulatory Visit: Payer: Self-pay | Admitting: Internal Medicine

## 2012-10-12 ENCOUNTER — Ambulatory Visit (INDEPENDENT_AMBULATORY_CARE_PROVIDER_SITE_OTHER): Payer: Medicare Other | Admitting: *Deleted

## 2012-10-12 DIAGNOSIS — I4891 Unspecified atrial fibrillation: Secondary | ICD-10-CM

## 2012-10-12 DIAGNOSIS — I428 Other cardiomyopathies: Secondary | ICD-10-CM

## 2012-10-12 DIAGNOSIS — Z7901 Long term (current) use of anticoagulants: Secondary | ICD-10-CM

## 2012-10-27 ENCOUNTER — Ambulatory Visit (INDEPENDENT_AMBULATORY_CARE_PROVIDER_SITE_OTHER): Payer: Medicare Other | Admitting: *Deleted

## 2012-10-27 DIAGNOSIS — I428 Other cardiomyopathies: Secondary | ICD-10-CM

## 2012-10-27 LAB — POCT INR: INR: 1.9

## 2012-10-28 ENCOUNTER — Encounter: Payer: Medicare Other | Admitting: *Deleted

## 2012-11-17 ENCOUNTER — Other Ambulatory Visit: Payer: Self-pay | Admitting: Internal Medicine

## 2012-11-17 ENCOUNTER — Ambulatory Visit (INDEPENDENT_AMBULATORY_CARE_PROVIDER_SITE_OTHER): Payer: Medicare Other | Admitting: *Deleted

## 2012-11-17 DIAGNOSIS — I4891 Unspecified atrial fibrillation: Secondary | ICD-10-CM

## 2012-11-17 DIAGNOSIS — I428 Other cardiomyopathies: Secondary | ICD-10-CM

## 2012-11-17 DIAGNOSIS — Z7901 Long term (current) use of anticoagulants: Secondary | ICD-10-CM

## 2012-11-17 LAB — POCT INR: INR: 2.5

## 2012-12-15 ENCOUNTER — Ambulatory Visit (INDEPENDENT_AMBULATORY_CARE_PROVIDER_SITE_OTHER): Payer: Medicare Other | Admitting: *Deleted

## 2012-12-15 DIAGNOSIS — Z7901 Long term (current) use of anticoagulants: Secondary | ICD-10-CM

## 2012-12-15 DIAGNOSIS — I4891 Unspecified atrial fibrillation: Secondary | ICD-10-CM

## 2012-12-15 DIAGNOSIS — I428 Other cardiomyopathies: Secondary | ICD-10-CM

## 2012-12-15 LAB — POCT INR: INR: 2.4

## 2012-12-28 ENCOUNTER — Other Ambulatory Visit: Payer: Self-pay | Admitting: Emergency Medicine

## 2012-12-28 MED ORDER — LOSARTAN POTASSIUM 100 MG PO TABS: ORAL_TABLET | ORAL | Status: DC

## 2012-12-30 ENCOUNTER — Encounter: Payer: Self-pay | Admitting: Internal Medicine

## 2012-12-30 ENCOUNTER — Ambulatory Visit (INDEPENDENT_AMBULATORY_CARE_PROVIDER_SITE_OTHER): Payer: Medicare Other | Admitting: *Deleted

## 2012-12-30 DIAGNOSIS — Z95810 Presence of automatic (implantable) cardiac defibrillator: Secondary | ICD-10-CM

## 2012-12-30 DIAGNOSIS — I5022 Chronic systolic (congestive) heart failure: Secondary | ICD-10-CM

## 2013-01-08 ENCOUNTER — Other Ambulatory Visit: Payer: Self-pay | Admitting: Internal Medicine

## 2013-01-10 ENCOUNTER — Other Ambulatory Visit: Payer: Self-pay | Admitting: Cardiology

## 2013-01-18 ENCOUNTER — Ambulatory Visit (INDEPENDENT_AMBULATORY_CARE_PROVIDER_SITE_OTHER): Payer: Medicare Other | Admitting: Pharmacist

## 2013-01-18 DIAGNOSIS — I428 Other cardiomyopathies: Secondary | ICD-10-CM

## 2013-01-18 DIAGNOSIS — I4891 Unspecified atrial fibrillation: Secondary | ICD-10-CM

## 2013-01-18 DIAGNOSIS — Z7901 Long term (current) use of anticoagulants: Secondary | ICD-10-CM

## 2013-01-19 LAB — REMOTE ICD DEVICE
AL IMPEDENCE ICD: 719 Ohm
ATRIAL PACING ICD: 0 pct
FVT: 0
TZAT-0001FASTVT: 1
TZAT-0001SLOWVT: 2
TZAT-0013SLOWVT: 2
TZAT-0018FASTVT: NEGATIVE
TZON-0003SLOWVT: 342.9 ms
TZST-0001FASTVT: 3
TZST-0001SLOWVT: 3
TZST-0001SLOWVT: 6
TZST-0001SLOWVT: 7
TZST-0003FASTVT: 41 J
TZST-0003FASTVT: 41 J
TZST-0003SLOWVT: 41 J
TZST-0003SLOWVT: 41 J
VENTRICULAR PACING ICD: 100 pct
VF: 0

## 2013-02-12 ENCOUNTER — Emergency Department (HOSPITAL_COMMUNITY): Payer: Medicare Other

## 2013-02-12 ENCOUNTER — Encounter (HOSPITAL_COMMUNITY): Payer: Self-pay | Admitting: *Deleted

## 2013-02-12 ENCOUNTER — Inpatient Hospital Stay (HOSPITAL_COMMUNITY)
Admission: EM | Admit: 2013-02-12 | Discharge: 2013-02-18 | DRG: 864 | Disposition: A | Discharge status: Skilled Nursing Facility | Payer: Medicare Other | Attending: Internal Medicine | Admitting: Internal Medicine

## 2013-02-12 DIAGNOSIS — Z7901 Long term (current) use of anticoagulants: Secondary | ICD-10-CM

## 2013-02-12 DIAGNOSIS — R4182 Altered mental status, unspecified: Secondary | ICD-10-CM | POA: Diagnosis present

## 2013-02-12 DIAGNOSIS — E119 Type 2 diabetes mellitus without complications: Secondary | ICD-10-CM

## 2013-02-12 DIAGNOSIS — Z9181 History of falling: Secondary | ICD-10-CM

## 2013-02-12 DIAGNOSIS — I10 Essential (primary) hypertension: Secondary | ICD-10-CM | POA: Diagnosis present

## 2013-02-12 DIAGNOSIS — E871 Hypo-osmolality and hyponatremia: Secondary | ICD-10-CM | POA: Diagnosis present

## 2013-02-12 DIAGNOSIS — I5042 Chronic combined systolic (congestive) and diastolic (congestive) heart failure: Secondary | ICD-10-CM | POA: Diagnosis present

## 2013-02-12 DIAGNOSIS — Z7982 Long term (current) use of aspirin: Secondary | ICD-10-CM

## 2013-02-12 DIAGNOSIS — E669 Obesity, unspecified: Secondary | ICD-10-CM | POA: Diagnosis present

## 2013-02-12 DIAGNOSIS — Z794 Long term (current) use of insulin: Secondary | ICD-10-CM

## 2013-02-12 DIAGNOSIS — R5381 Other malaise: Secondary | ICD-10-CM

## 2013-02-12 DIAGNOSIS — I498 Other specified cardiac arrhythmias: Secondary | ICD-10-CM | POA: Diagnosis present

## 2013-02-12 DIAGNOSIS — G9341 Metabolic encephalopathy: Secondary | ICD-10-CM | POA: Diagnosis present

## 2013-02-12 DIAGNOSIS — D72819 Decreased white blood cell count, unspecified: Secondary | ICD-10-CM

## 2013-02-12 DIAGNOSIS — I509 Heart failure, unspecified: Secondary | ICD-10-CM | POA: Diagnosis present

## 2013-02-12 DIAGNOSIS — F0390 Unspecified dementia without behavioral disturbance: Secondary | ICD-10-CM | POA: Diagnosis present

## 2013-02-12 DIAGNOSIS — I447 Left bundle-branch block, unspecified: Secondary | ICD-10-CM | POA: Diagnosis present

## 2013-02-12 DIAGNOSIS — R509 Fever, unspecified: Principal | ICD-10-CM | POA: Diagnosis present

## 2013-02-12 DIAGNOSIS — D696 Thrombocytopenia, unspecified: Secondary | ICD-10-CM

## 2013-02-12 DIAGNOSIS — D61818 Other pancytopenia: Secondary | ICD-10-CM | POA: Diagnosis not present

## 2013-02-12 DIAGNOSIS — Z95810 Presence of automatic (implantable) cardiac defibrillator: Secondary | ICD-10-CM | POA: Diagnosis present

## 2013-02-12 DIAGNOSIS — R531 Weakness: Secondary | ICD-10-CM | POA: Diagnosis present

## 2013-02-12 DIAGNOSIS — I5022 Chronic systolic (congestive) heart failure: Secondary | ICD-10-CM

## 2013-02-12 DIAGNOSIS — I428 Other cardiomyopathies: Secondary | ICD-10-CM | POA: Diagnosis present

## 2013-02-12 DIAGNOSIS — Z79899 Other long term (current) drug therapy: Secondary | ICD-10-CM

## 2013-02-12 DIAGNOSIS — J209 Acute bronchitis, unspecified: Secondary | ICD-10-CM | POA: Diagnosis present

## 2013-02-12 DIAGNOSIS — I4891 Unspecified atrial fibrillation: Secondary | ICD-10-CM | POA: Diagnosis present

## 2013-02-12 HISTORY — DX: Atherosclerotic heart disease of native coronary artery without angina pectoris: I25.10

## 2013-02-12 HISTORY — DX: Other ventricular tachycardia: I47.29

## 2013-02-12 HISTORY — DX: Unspecified dementia, unspecified severity, without behavioral disturbance, psychotic disturbance, mood disturbance, and anxiety: F03.90

## 2013-02-12 HISTORY — DX: Ventricular tachycardia: I47.2

## 2013-02-12 HISTORY — DX: Chronic systolic (congestive) heart failure: I50.22

## 2013-02-12 HISTORY — DX: Left bundle-branch block, unspecified: I44.7

## 2013-02-12 LAB — TROPONIN I: Troponin I: <0.3 ng/mL (ref <0.30)

## 2013-02-12 LAB — CBC
HCT: 38.2 % — ABNORMAL LOW (ref 39.0–52.0)
Hemoglobin: 12.9 g/dL — ABNORMAL LOW (ref 13.0–17.0)
MCHC: 33.8 g/dL (ref 30.0–36.0)
MCV: 73.9 fL — ABNORMAL LOW (ref 78.0–100.0)
RDW: 15.7 % — ABNORMAL HIGH (ref 11.5–15.5)

## 2013-02-12 LAB — POCT I-STAT, CHEM 8
Calcium, Ion: 1.18 mmol/L (ref 1.13–1.30)
Creatinine, Ser: 1.4 mg/dL — ABNORMAL HIGH (ref 0.50–1.35)
Glucose, Bld: 459 mg/dL — ABNORMAL HIGH (ref 70–99)
HCT: 42 % (ref 39.0–52.0)
Hemoglobin: 14.3 g/dL (ref 13.0–17.0)
TCO2: 23 mmol/L (ref 0–100)

## 2013-02-12 LAB — DIFFERENTIAL
Basophils Absolute: 0 10*3/uL (ref 0.0–0.1)
Basophils Relative: 0 % (ref 0–1)
Eosinophils Relative: 0 % (ref 0–5)
Lymphocytes Relative: 4 % — ABNORMAL LOW (ref 12–46)
Monocytes Absolute: 0.2 10*3/uL (ref 0.1–1.0)
Monocytes Relative: 7 % (ref 3–12)
Neutro Abs: 2.7 10*3/uL (ref 1.7–7.7)

## 2013-02-12 LAB — URINALYSIS, ROUTINE W REFLEX MICROSCOPIC
Bilirubin Urine: NEGATIVE
Glucose, UA: >1000 mg/dL — AB
Hgb urine dipstick: NEGATIVE
Ketones, ur: NEGATIVE mg/dL
Leukocytes, UA: NEGATIVE
pH: 5.5 (ref 5.0–8.0)

## 2013-02-12 LAB — COMPREHENSIVE METABOLIC PANEL
BUN: 21 mg/dL (ref 6–23)
CO2: 24 mEq/L (ref 19–32)
Calcium: 9.4 mg/dL (ref 8.4–10.5)
Chloride: 94 mEq/L — ABNORMAL LOW (ref 96–112)
Creatinine, Ser: 1.21 mg/dL (ref 0.50–1.35)
GFR calc non Af Amer: 59 mL/min — ABNORMAL LOW (ref >90)
Total Bilirubin: 0.2 mg/dL — ABNORMAL LOW (ref 0.3–1.2)

## 2013-02-12 LAB — APTT: aPTT: 57 seconds — ABNORMAL HIGH (ref 24–37)

## 2013-02-12 LAB — GLUCOSE, CAPILLARY
Glucose-Capillary: 439 mg/dL — ABNORMAL HIGH (ref 70–99)
Glucose-Capillary: 301 mg/dL — ABNORMAL HIGH (ref 70–99)

## 2013-02-12 LAB — URINE MICROSCOPIC-ADD ON

## 2013-02-12 MED ORDER — SODIUM CHLORIDE 0.9 % IV BOLUS (SEPSIS)
1000.0000 mL | Freq: Once | INTRAVENOUS | Status: AC
  Administered 2013-02-12: 1000 mL via INTRAVENOUS

## 2013-02-12 MED ORDER — ACETAMINOPHEN 325 MG PO TABS
650.0000 mg | ORAL_TABLET | Freq: Four times a day (QID) | ORAL | Status: DC | PRN
  Administered 2013-02-12 – 2013-02-15 (×7): 650 mg via ORAL
  Filled 2013-02-12 (×8): qty 2

## 2013-02-12 NOTE — ED Notes (Signed)
CBG was 439. Notified Nurse Johnston Ebbs.

## 2013-02-12 NOTE — H&P (Signed)
[facility] History and Physical  [name] [name]:[id number] DOB: [birth year]    PCP:   [name], MD   Chief Complaint: Dr Wilburt Finlay of Family Medicine.  HPI: [name] [name] is an 67 y.o. male with hx of DM, dilated CMP, HTN, afib on Coumadin, acute on chronic diastolic CHF, s/p ICD, dementia with progressive decline over the past year, frequent falls at home, brought to the ER because of generalized weakness, and having another fall. In the ER, he was found to have a fever with Tmax of 102.  Evaluation included a head CT which showed no acute processes, a negative UA, and a negative CXR for any evidence of infection.  His WBC is 3.1K and lactic acid was normal, normal platelet count, and Cr of 1.2.  His Na is 129 and he appears only slightly volume depleted clinically.  Hospitialist was asked to admit him for generalized weakness and fever of undetermined source.  He has no abdominal pain, nausea, vomiting, join swelling, diarrhea or rash.  The only complaint is a mild nonproductive cough for one day.  Rewiew of Systems:  Constitutional:  No significant weight loss or weight gain Eyes: Negative for eye pain, redness and discharge, diplopia, visual changes, or flashes of light. ENMT: Negative for ear pain, hoarseness, nasal congestion, sinus pressure and sore throat. No headaches; tinnitus, drooling, or problem swallowing. Cardiovascular: Negative for chest pain, palpitations, diaphoresis, dyspnea and peripheral edema. ; No orthopnea, PND Respiratory: Negative for cough, hemoptysis, wheezing and stridor. No pleuritic chestpain. Gastrointestinal: Negative for nausea, vomiting, diarrhea, constipation, abdominal pain, melena, blood in stool, hematemesis, jaundice and rectal bleeding.    Genitourinary: Negative for frequency, dysuria, incontinence,flank pain and hematuria; Musculoskeletal: Negative for back pain and neck pain. Negative for swelling and trauma.;   Skin: . Negative for pruritus, rash, abrasions, bruising and skin lesion.; ulcerations Neuro: Negative for headache, lightheadedness and neck stiffness. Negative for weakness, altered level of consciousness , altered mental status, extremity weakness, burning feet, involuntary movement, seizure and syncope.  Psych: negative for anxiety, depression, insomnia, tearfulness, panic attacks, hallucinations, paranoia, suicidal or homicidal ideation.   Past Medical History  Diagnosis Date  . Diabetes mellitus   . GERD (gastroesophageal reflux disease)   . Obesity   . Cardiomyopathy, nonischemic   . Chronic anticoagulation   . Hypertension   . ICD (implantable cardiac defibrillator) in place     has BiV in place.   . Acute on chronic diastolic heart failure     Past Surgical History  Procedure Laterality Date  . Cardiac catheterization  10/01/2001    THERE WAS GLOBAL HYPOKINESIS AND EF 25%. THERE APPEARED TO BE GLOBAL DECREASE IN WALL MOTION  . Lithotripsy  2001  . Cervical spine surgery  1994  . Cardiac defibrillator placement  06/2009    WITH GENERATOR REPLACED; BiV ICD  . US echocardiography  03/21/2008    EF 30-35%  . Cardiovascular stress test  03/20/2009    EF 33%    Medications:  HOME MEDS: Prior to Admission medications   Medication Sig Start Date End Date Taking? Authorizing Provider  aspirin 325 MG tablet Take 325 mg by mouth daily.     Yes Historical Provider, MD  atorvastatin (LIPITOR) 10 MG tablet Take 10 mg by mouth daily.     Yes Historical Provider, MD  carvedilol (COREG) 12.5 MG tablet Take 12.5-18.75 mg by mouth 3 (three) times daily with meals. 1 tablet in the morning; 1 and a  half tablet in the afternoon; 1 tablet at night   Yes Historical Provider, MD  esomeprazole (NEXIUM) 40 MG capsule Take 40 mg by mouth as needed (acid reflux).    Yes Historical Provider, MD  furosemide (LASIX) 40 MG tablet Take 40 mg by mouth 2 (two) times daily.   Yes Historical Provider, MD   glipiZIDE (GLUCOTROL) 10 MG tablet Take 10 mg by mouth 2 (two) times daily before a meal.   Yes Historical Provider, MD  insulin detemir (LEVEMIR) 100 UNIT/ML injection Inject 22 Units into the skin at bedtime. As directed   Yes Historical Provider, MD  metFORMIN (GLUCOPHAGE) 1000 MG tablet Take 500 mg by mouth 2 (two) times daily with a meal.    Yes Historical Provider, MD  pantoprazole (PROTONIX) 40 MG tablet Take 40 mg by mouth daily.     Yes Historical Provider, MD  rosiglitazone (AVANDIA) 2 MG tablet Take 2 mg by mouth daily.     Yes Historical Provider, MD  sitaGLIPtin (JANUVIA) 100 MG tablet Take 100 mg by mouth daily.   Yes Historical Provider, MD  spironolactone (ALDACTONE) 25 MG tablet Take 0.5 tablets (12.5 mg total) by mouth daily. 12/18/11  Yes Duke Salvia, MD  valsartan (DIOVAN) 80 MG tablet Take 80 mg by mouth every morning.   Yes Historical Provider, MD  warfarin (COUMADIN) 5 MG tablet Take 10-15 mg by mouth daily. 15mg  Monday Wednesday, Friday; 10mg  the rest of the week   Yes Historical Provider, MD     Allergies:  No Known Allergies  Social History:   reports that he has never smoked. He does not have any smokeless tobacco history on file. He reports that he does not drink alcohol or use illicit drugs.  Family History: Family History  Problem Relation Age of Onset  . Heart disease Mother   . Hypertension Mother   . Diabetes Mother   . Diabetes Father      Physical Exam: Filed Vitals:   02/12/13 1945 02/12/13 2000 02/12/13 2139 02/12/13 2145  BP: 104/64 108/62 123/69 121/103  Pulse: 88 86 94 92  Temp:   99.4 F (37.4 C)   TempSrc:   Oral   Resp: 20 19 20 20   SpO2: 94% 94% 97% 97%   Blood pressure 121/103, pulse 92, temperature 99.4 F (37.4 C), temperature source Oral, resp. rate 20, SpO2 97.00%.  GEN:  Pleasant  patient lying in the stretcher in no acute distress; cooperative with exam. PSYCH:  alert and oriented x4; does not appear anxious or  depressed; affect is appropriate. HEENT: Mucous membranes pink and anicteric; PERRLA; EOM intact; no cervical lymphadenopathy nor thyromegaly or carotid bruit; no JVD; There were no stridor. Neck is very supple. Breasts:: Not examined CHEST WALL: No tenderness CHEST: Normal respiration, clear to auscultation bilaterally.  HEART: Regular rate and rhythm.  There are no murmur, rub, or gallops.   BACK: No kyphosis or scoliosis; no CVA tenderness ABDOMEN: soft and non-tender; no masses, no organomegaly, normal abdominal bowel sounds; no pannus; no intertriginous candida. There is no rebound and no distention. Rectal Exam: Not done EXTREMITIES: No bone or joint deformity; age-appropriate arthropathy of the hands and knees; no edema; no ulcerations.  There is no calf tenderness. Genitalia: not examined PULSES: 2+ and symmetric SKIN: Normal hydration no rash or ulceration CNS: Cranial nerves 2-12 grossly intact no focal lateralizing neurologic deficit.  Speech is fluent; uvula elevated with phonation, facial symmetry and tongue midline. DTR are normal bilaterally,  cerebella exam is intact, barbinski is negative and strengths are equaled bilaterally.  No sensory loss.   Labs on Admission:  Basic Metabolic Panel:  Recent Labs Lab 02/12/13 1739 02/12/13 1745  NA 129* 132*  K 3.9 4.0  CL 94* 97  CO2 24  --   GLUCOSE 453* 459*  BUN 21 21  CREATININE 1.21 1.40*  CALCIUM 9.4  --    Liver Function Tests:  Recent Labs Lab 02/12/13 1739  AST 21  ALT 25  ALKPHOS 101  BILITOT 0.2*  PROT 7.5  ALBUMIN 3.7   No results found for this basename: LIPASE, AMYLASE,  in the last 168 hours No results found for this basename: AMMONIA,  in the last 168 hours CBC:  Recent Labs Lab 02/12/13 1739 02/12/13 1745  WBC 3.1*  --   NEUTROABS 2.7  --   HGB 12.9* 14.3  HCT 38.2* 42.0  MCV 73.9*  --   PLT 157  --    Cardiac Enzymes:  Recent Labs Lab 02/12/13 1739  TROPONINI <0.30     CBG:  Recent Labs Lab 02/12/13 1727 02/12/13 2220  GLUCAP 439* 301*     Radiological Exams on Admission: Dg Chest 2 View  02/12/2013   *RADIOLOGY REPORT*  Clinical Data: Weakness, fall, numbness in feet, headache, nausea, history diabetes, hypertension, nonischemic cardiomyopathy  CHEST - 2 VIEW  Comparison: 12/22/2003  Findings: Left subclavian pacemaker / AICD leads project at right atrium, right ankle, and coronary sinus. Upper normal heart size. Normal mediastinal contours and pulmonary vascularity for AP technique. Lungs appear hyperinflated without infiltrate, pleural effusion or pneumothorax. Minimal peribronchial thickening. No acute osseous findings.  IMPRESSION: Post AICD. Bronchitic changes and hyperinflation without acute infiltrate.   Original Report Authenticated By: Ulyses Southward, M.D.   Ct Head Wo Contrast  02/12/2013   *RADIOLOGY REPORT*  Clinical Data: Frequent falls with unsteady gait and confusion.  CT HEAD WITHOUT CONTRAST  Technique:  Contiguous axial images were obtained from the base of the skull through the vertex without contrast.  Comparison: None.  Findings: Mild to moderate atrophy.  Chronic microvascular ischemic change affects the periventricular and subcortical white matter. No acute stroke or hemorrhage.  No mass lesion or hydrocephalus. No extra-axial fluid.  Small calcifications in the mid pons, likely dystrophic from a small vascular malformation or remote infarction. No evidence for pontine mass.  Advanced vascular calcification. Calvarium intact.  Clear sinuses and mastoids.  IMPRESSION: Mild to moderate atrophy and chronic microvascular ischemic change. No acute intracranial findings.   Original Report Authenticated By: Davonna Belling, M.D.     Assessment/Plan Present on Admission:  . Fever of undetermined origin . Weakness generalized . LBBB . HTN (hypertension) . Biventricular implantable cardioverter-defibrillator in situ . Atrial fibrillation .  AODM . SYSTOLIC HEART FAILURE, CHRONIC . CARDIOMYOPATHY, PRIMARY, DILATED  PLAN:  Since he is hemodynamically stable, with normal lactic acid, and no definite source is identified, I will panculture him, but hold off on antibiotics.  Please have low threshold for starting broad spectrum ab.  He does have slight leukopenia, which could be sepsis, but I think it is more likely to be a viral syndrome.  He other problem is weakness and having unsteady gait.  Will consult OT/PT.  He is otherwise stable, and I will continue his meds including his coumadin for his afib.  For his BS, will continue his Metformin, Avandia, Januvia, and use SSI.  He is slightly volume depleted, so I will hold his  diuretics, but he has severe CHF, so will not give any IVF.  He is therapeutic on his INR.  He is stable, full code, and will be admitted to [facility] service.  Thank you for allowing me to partake in the care of this nice patient.  Other plans as per orders.  Code Status: FULL Unk Lightning, MD. [facility] Pager [phone] 7pm to 7am.  02/12/2013, 11:35 PM

## 2013-02-12 NOTE — ED Notes (Signed)
Per family, pt hasnt been feeling well for several weeks, having frequent falls, unsteady gait and confusion. Temp 102.4 at triage, no neuro deficits noted, grips equal, no arm drift no facial droop.

## 2013-02-12 NOTE — ED Provider Notes (Signed)
History     CSN: [id number]  Arrival date & time 02/12/13  1628   First MD Initiated Contact with Patient 02/12/13 1806      Chief Complaint  Patient presents with  . Weakness    (Consider location/radiation/quality/duration/timing/severity/associated sxs/prior treatment) Patient is a 67 y.o. male presenting with general illness.  Illness Location:  Generalized Quality:  Weakness, fall Severity:  Moderate Onset quality:  Gradual Duration:  2 weeks (fall today) Timing:  Constant Progression:  Worsening Chronicity:  New Context:  Progressive decrease in mental acuity Relieved by:  Nothing Worsened by:  Nothing Associated symptoms: cough (starting yesterday)   Associated symptoms: no abdominal pain, no chest pain, no congestion, no diarrhea, no fever, no headaches, no loss of consciousness, no nausea, no rash, no rhinorrhea, no shortness of breath, no sore throat and no vomiting   Associated symptoms comment:  Ataxia over last week   Past Medical History  Diagnosis Date  . Diabetes mellitus   . GERD (gastroesophageal reflux disease)   . Obesity   . Cardiomyopathy, nonischemic   . Chronic anticoagulation   . Hypertension   . ICD (implantable cardiac defibrillator) in place     has BiV in place.   . Acute on chronic diastolic heart failure     Past Surgical History  Procedure Laterality Date  . Cardiac catheterization  10/01/2001    THERE WAS GLOBAL HYPOKINESIS AND EF 25%. THERE APPEARED TO BE GLOBAL DECREASE IN WALL MOTION  . Lithotripsy  2001  . Cervical spine surgery  1994  . Cardiac defibrillator placement  06/2009    WITH GENERATOR REPLACED; BiV ICD  . US echocardiography  03/21/2008    EF 30-35%  . Cardiovascular stress test  03/20/2009    EF 33%    Family History  Problem Relation Age of Onset  . Heart disease Mother   . Hypertension Mother   . Diabetes Mother   . Diabetes Father     History  Substance Use Topics  . Smoking status: Never Smoker   .  Smokeless tobacco: Not on file  . Alcohol Use: No      Review of Systems  Constitutional: Negative for fever and chills.  HENT: Negative for congestion, sore throat and rhinorrhea.   Eyes: Negative for photophobia and visual disturbance.  Respiratory: Positive for cough (starting yesterday). Negative for shortness of breath.   Cardiovascular: Negative for chest pain and leg swelling.  Gastrointestinal: Negative for nausea, vomiting, abdominal pain, diarrhea and constipation.  Endocrine: Negative for polydipsia and polyuria.  Genitourinary: Negative for dysuria and hematuria.  Musculoskeletal: Negative for back pain and arthralgias.  Skin: Negative for color change and rash.  Neurological: Negative for dizziness, loss of consciousness, syncope, light-headedness and headaches.  Hematological: Negative for adenopathy. Does not bruise/bleed easily.  All other systems reviewed and are negative.    Allergies  Review of patient's allergies indicates no known allergies.  Home Medications   No current outpatient prescriptions on file.  BP 103/47  Pulse 88  Temp(Src) 98.5 F (36.9 C) (Oral)  Resp 23  Ht 5\' 9"  (1.753 m)  Wt 185 lb 4.8 oz (84.052 kg)  BMI 27.35 kg/m2  SpO2 93%  Physical Exam  Vitals reviewed. Constitutional: He is oriented to person, place, and time. He appears well-developed and well-nourished.  HENT:  Head: Normocephalic and atraumatic.  Eyes: Conjunctivae and EOM are normal.  Neck: Normal range of motion. Neck supple.  Cardiovascular: Normal rate, regular rhythm and normal  heart sounds.   Pulmonary/Chest: Effort normal and breath sounds normal. No respiratory distress.  Abdominal: He exhibits no distension. There is no tenderness. There is no rebound and no guarding.  Musculoskeletal: Normal range of motion.  Neurological: He is alert and oriented to person, place, and time. He has normal strength and normal reflexes. No cranial nerve deficit or sensory  deficit. Gait (unsteady, however able to ambulate without difficulty) abnormal. GCS eye subscore is 4. GCS verbal subscore is 5. GCS motor subscore is 6.  Skin: Skin is warm and dry.    ED Course  Procedures (including critical care time)  Labs Reviewed  PROTIME-INR - Abnormal; Notable for the following:    Prothrombin Time 23.9 (*)    INR 2.25 (*)    All other components within normal limits  APTT - Abnormal; Notable for the following:    aPTT 57 (*)    All other components within normal limits  CBC - Abnormal; Notable for the following:    WBC 3.1 (*)    Hemoglobin 12.9 (*)    HCT 38.2 (*)    MCV 73.9 (*)    MCH 25.0 (*)    RDW 15.7 (*)    All other components within normal limits  DIFFERENTIAL - Abnormal; Notable for the following:    Neutrophils Relative % 89 (*)    Lymphocytes Relative 4 (*)    Lymphs Abs 0.1 (*)    All other components within normal limits  COMPREHENSIVE METABOLIC PANEL - Abnormal; Notable for the following:    Sodium 129 (*)    Chloride 94 (*)    Glucose, Bld 453 (*)    Total Bilirubin 0.2 (*)    GFR calc non Af Amer 59 (*)    GFR calc Af Amer 69 (*)    All other components within normal limits  GLUCOSE, CAPILLARY - Abnormal; Notable for the following:    Glucose-Capillary 439 (*)    All other components within normal limits  URINALYSIS, ROUTINE W REFLEX MICROSCOPIC - Abnormal; Notable for the following:    Glucose, UA >1000 (*)    All other components within normal limits  GLUCOSE, CAPILLARY - Abnormal; Notable for the following:    Glucose-Capillary 301 (*)    All other components within normal limits  POCT I-STAT, CHEM 8 - Abnormal; Notable for the following:    Sodium 132 (*)    Creatinine, Ser 1.40 (*)    Glucose, Bld 459 (*)    All other components within normal limits  URINE CULTURE  TROPONIN I  URINE MICROSCOPIC-ADD ON  TSH  PROTIME-INR  POCT I-STAT TROPONIN I  CG4 I-STAT (LACTIC ACID)   Dg Chest 2 View  02/12/2013    *RADIOLOGY REPORT*  Clinical Data: Weakness, fall, numbness in feet, headache, nausea, history diabetes, hypertension, nonischemic cardiomyopathy  CHEST - 2 VIEW  Comparison: 12/22/2003  Findings: Left subclavian pacemaker / AICD leads project at right atrium, right ankle, and coronary sinus. Upper normal heart size. Normal mediastinal contours and pulmonary vascularity for AP technique. Lungs appear hyperinflated without infiltrate, pleural effusion or pneumothorax. Minimal peribronchial thickening. No acute osseous findings.  IMPRESSION: Post AICD. Bronchitic changes and hyperinflation without acute infiltrate.   Original Report Authenticated By: Ulyses Southward, M.D.   Ct Head Wo Contrast  02/12/2013   *RADIOLOGY REPORT*  Clinical Data: Frequent falls with unsteady gait and confusion.  CT HEAD WITHOUT CONTRAST  Technique:  Contiguous axial images were obtained from the base of the  skull through the vertex without contrast.  Comparison: None.  Findings: Mild to moderate atrophy.  Chronic microvascular ischemic change affects the periventricular and subcortical white matter. No acute stroke or hemorrhage.  No mass lesion or hydrocephalus. No extra-axial fluid.  Small calcifications in the mid pons, likely dystrophic from a small vascular malformation or remote infarction. No evidence for pontine mass.  Advanced vascular calcification. Calvarium intact.  Clear sinuses and mastoids.  IMPRESSION: Mild to moderate atrophy and chronic microvascular ischemic change. No acute intracranial findings.   Original Report Authenticated By: Davonna Belling, M.D.     1. Weakness   2. Atrial fibrillation   3. Chronic anticoagulation   4. Fever of undetermined origin       MDM   67 y.o. male  with pertinent PMH of DM, NICM, obesity, HTN, CHF sp ICD presents with progressive fatigue, weakness, and deteriorating mental status, with fall today.  Pt states his legs became weak and he slipped, denies LOC, however is not sure if  he hit his head.  No external signs of trauma.  Vitals with fever.  Physical exam with no focal neuro deficits, otherwise benign exam.  Labs and imaging as above unremarkable for source of infection, however given onset of respiratory symptoms within 24 hours, feel symptoms likely early PNA vs viral illness, rather than other occult infection such as meningitis/encephalitis/bowel.  Labs demonstrated low WBC, in setting of recent cough, consider most likely etiology viral URI.  CT head did demonstrate chronic microvascular change, and this is likely contributing to his decline.  Given now resolved symptoms and lack of clear etiology for ataxia, cannot rule out TIA, and feel pt warrants admission for same.  Consulted hospitalist and pt admitted.    Labs and imaging as above reviewed by myself and attending,Dr. Ranae Palms, with whom case was discussed.   1. Weakness   2. Atrial fibrillation   3. Chronic anticoagulation   4. Fever of undetermined origin             Noel Gerold, MD 02/13/13 [phone]

## 2013-02-13 ENCOUNTER — Inpatient Hospital Stay (HOSPITAL_COMMUNITY): Payer: Medicare Other

## 2013-02-13 ENCOUNTER — Encounter (HOSPITAL_COMMUNITY): Payer: Self-pay | Admitting: *Deleted

## 2013-02-13 LAB — COMPREHENSIVE METABOLIC PANEL
ALT: 26 U/L (ref 0–53)
Albumin: 2.7 g/dL — ABNORMAL LOW (ref 3.5–5.2)
Alkaline Phosphatase: 66 U/L (ref 39–117)
Calcium: 8.3 mg/dL — ABNORMAL LOW (ref 8.4–10.5)
GFR calc Af Amer: 62 mL/min — ABNORMAL LOW (ref >90)
Potassium: 3.9 mEq/L (ref 3.5–5.1)
Sodium: 135 mEq/L (ref 135–145)
Total Protein: 6.2 g/dL (ref 6.0–8.3)

## 2013-02-13 LAB — BLOOD GAS, ARTERIAL
Bicarbonate: 24.1 mEq/L — ABNORMAL HIGH (ref 20.0–24.0)
Drawn by: 24610
FIO2: 0.21 %
O2 Saturation: 90.2 %
pCO2 arterial: 34.7 mmHg — ABNORMAL LOW (ref 35.0–45.0)
pO2, Arterial: 54.1 mmHg — ABNORMAL LOW (ref 80.0–100.0)

## 2013-02-13 LAB — URINALYSIS W MICROSCOPIC + REFLEX CULTURE
Glucose, UA: >1000 mg/dL — AB
Ketones, ur: NEGATIVE mg/dL
Leukocytes, UA: NEGATIVE
Nitrite: NEGATIVE
Protein, ur: NEGATIVE mg/dL
Urobilinogen, UA: 1 mg/dL (ref 0.0–1.0)

## 2013-02-13 LAB — CBC
MCH: 24.8 pg — ABNORMAL LOW (ref 26.0–34.0)
MCHC: 33.5 g/dL (ref 30.0–36.0)
Platelets: 112 10*3/uL — ABNORMAL LOW (ref 150–400)
RDW: 16 % — ABNORMAL HIGH (ref 11.5–15.5)

## 2013-02-13 LAB — PROTIME-INR: Prothrombin Time: 23.3 seconds — ABNORMAL HIGH (ref 11.6–15.2)

## 2013-02-13 LAB — RPR: RPR Ser Ql: NONREACTIVE

## 2013-02-13 LAB — GLUCOSE, CAPILLARY: Glucose-Capillary: 255 mg/dL — ABNORMAL HIGH (ref 70–99)

## 2013-02-13 LAB — AMMONIA: Ammonia: 17 umol/L (ref 11–60)

## 2013-02-13 LAB — TSH: TSH: 0.582 u[IU]/mL (ref 0.350–4.500)

## 2013-02-13 LAB — CREATININE, URINE, RANDOM: Creatinine, Urine: 78.5 mg/dL

## 2013-02-13 LAB — LIPASE, BLOOD: Lipase: 16 U/L (ref 11–59)

## 2013-02-13 MED ORDER — INSULIN ASPART 100 UNIT/ML ~~LOC~~ SOLN
5.0000 [IU] | Freq: Three times a day (TID) | SUBCUTANEOUS | Status: DC
  Administered 2013-02-13: 2 [IU] via SUBCUTANEOUS

## 2013-02-13 MED ORDER — DOCUSATE SODIUM 100 MG PO CAPS
100.0000 mg | ORAL_CAPSULE | Freq: Two times a day (BID) | ORAL | Status: DC
  Administered 2013-02-13 – 2013-02-18 (×12): 100 mg via ORAL
  Filled 2013-02-13 (×13): qty 1

## 2013-02-13 MED ORDER — ATORVASTATIN CALCIUM 10 MG PO TABS
10.0000 mg | ORAL_TABLET | Freq: Every day | ORAL | Status: DC
  Administered 2013-02-13: 10 mg via ORAL
  Filled 2013-02-13 (×2): qty 1

## 2013-02-13 MED ORDER — ASPIRIN 325 MG PO TABS
325.0000 mg | ORAL_TABLET | Freq: Every day | ORAL | Status: DC
  Administered 2013-02-13 – 2013-02-14 (×2): 325 mg via ORAL
  Filled 2013-02-13 (×3): qty 1

## 2013-02-13 MED ORDER — SODIUM CHLORIDE 0.9 % IV SOLN
250.0000 mL | INTRAVENOUS | Status: DC | PRN
  Administered 2013-02-13: 250 mL via INTRAVENOUS

## 2013-02-13 MED ORDER — PANTOPRAZOLE SODIUM 40 MG PO TBEC
40.0000 mg | DELAYED_RELEASE_TABLET | Freq: Every day | ORAL | Status: DC
  Administered 2013-02-13: 40 mg via ORAL
  Filled 2013-02-13: qty 1

## 2013-02-13 MED ORDER — IOHEXOL 300 MG/ML  SOLN
100.0000 mL | Freq: Once | INTRAMUSCULAR | Status: AC | PRN
  Administered 2013-02-13: 100 mL via INTRAVENOUS

## 2013-02-13 MED ORDER — PROMETHAZINE HCL 25 MG/ML IJ SOLN
25.0000 mg | Freq: Three times a day (TID) | INTRAMUSCULAR | Status: DC | PRN
  Administered 2013-02-14 – 2013-02-16 (×2): 25 mg via INTRAVENOUS
  Filled 2013-02-13 (×3): qty 1

## 2013-02-13 MED ORDER — PANTOPRAZOLE SODIUM 40 MG PO TBEC: 40.0000 mg | DELAYED_RELEASE_TABLET | Freq: Every day | ORAL | Status: DC

## 2013-02-13 MED ORDER — SODIUM CHLORIDE 0.9 % IJ SOLN
3.0000 mL | INTRAMUSCULAR | Status: DC | PRN
  Administered 2013-02-15: 3 mL via INTRAVENOUS

## 2013-02-13 MED ORDER — WARFARIN SODIUM 10 MG PO TABS
10.0000 mg | ORAL_TABLET | ORAL | Status: DC
  Administered 2013-02-13: 10 mg via ORAL
  Filled 2013-02-13: qty 1

## 2013-02-13 MED ORDER — SODIUM CHLORIDE 0.9 % IJ SOLN
3.0000 mL | Freq: Two times a day (BID) | INTRAMUSCULAR | Status: DC
  Administered 2013-02-15 – 2013-02-18 (×6): 3 mL via INTRAVENOUS

## 2013-02-13 MED ORDER — INSULIN ASPART 100 UNIT/ML ~~LOC~~ SOLN
0.0000 [IU] | Freq: Three times a day (TID) | SUBCUTANEOUS | Status: DC
  Administered 2013-02-13: 5 [IU] via SUBCUTANEOUS
  Administered 2013-02-13: 7 [IU] via SUBCUTANEOUS
  Administered 2013-02-14: 5 [IU] via SUBCUTANEOUS

## 2013-02-13 MED ORDER — IRBESARTAN 75 MG PO TABS
75.0000 mg | ORAL_TABLET | Freq: Every day | ORAL | Status: DC
  Administered 2013-02-13: 75 mg via ORAL
  Filled 2013-02-13 (×2): qty 1

## 2013-02-13 MED ORDER — GLIPIZIDE 10 MG PO TABS
10.0000 mg | ORAL_TABLET | Freq: Two times a day (BID) | ORAL | Status: DC
  Administered 2013-02-13: 10 mg via ORAL
  Filled 2013-02-13 (×3): qty 1

## 2013-02-13 MED ORDER — SODIUM CHLORIDE 0.9 % IV SOLN
250.0000 mL | INTRAVENOUS | Status: DC | PRN
  Administered 2013-02-14: 04:00:00 via INTRAVENOUS

## 2013-02-13 MED ORDER — INSULIN ASPART 100 UNIT/ML ~~LOC~~ SOLN
0.0000 [IU] | Freq: Every day | SUBCUTANEOUS | Status: DC
  Administered 2013-02-13: 4 [IU] via SUBCUTANEOUS

## 2013-02-13 MED ORDER — CARVEDILOL 12.5 MG PO TABS
12.5000 mg | ORAL_TABLET | Freq: Two times a day (BID) | ORAL | Status: DC
  Administered 2013-02-13 – 2013-02-14 (×4): 12.5 mg via ORAL
  Filled 2013-02-13 (×5): qty 1

## 2013-02-13 MED ORDER — ROSIGLITAZONE MALEATE 2 MG PO TABS
2.0000 mg | ORAL_TABLET | Freq: Every day | ORAL | Status: DC
  Filled 2013-02-13 (×5): qty 1

## 2013-02-13 MED ORDER — WARFARIN SODIUM 10 MG PO TABS
10.0000 mg | ORAL_TABLET | ORAL | Status: AC
  Administered 2013-02-13: 10 mg via ORAL
  Filled 2013-02-13: qty 1

## 2013-02-13 MED ORDER — ONDANSETRON HCL 4 MG PO TABS: 4.0000 mg | ORAL_TABLET | Freq: Four times a day (QID) | ORAL | Status: DC | PRN

## 2013-02-13 MED ORDER — ONDANSETRON HCL 4 MG/2ML IJ SOLN
4.0000 mg | Freq: Four times a day (QID) | INTRAMUSCULAR | Status: DC | PRN
  Administered 2013-02-13 (×2): 4 mg via INTRAVENOUS
  Filled 2013-02-13 (×3): qty 2

## 2013-02-13 MED ORDER — LEVOFLOXACIN IN D5W 750 MG/150ML IV SOLN
750.0000 mg | INTRAVENOUS | Status: DC
  Administered 2013-02-13 – 2013-02-14 (×2): 750 mg via INTRAVENOUS
  Filled 2013-02-13 (×3): qty 150

## 2013-02-13 MED ORDER — WARFARIN SODIUM 7.5 MG PO TABS
15.0000 mg | ORAL_TABLET | ORAL | Status: DC
  Filled 2013-02-13: qty 2

## 2013-02-13 MED ORDER — WARFARIN - PHARMACIST DOSING INPATIENT: Freq: Every day | Status: DC

## 2013-02-13 MED ORDER — CARVEDILOL 6.25 MG PO TABS
18.7500 mg | ORAL_TABLET | Freq: Every day | ORAL | Status: DC
  Administered 2013-02-14: 18.75 mg via ORAL
  Filled 2013-02-13: qty 1

## 2013-02-13 MED ORDER — GLIPIZIDE 5 MG PO TABS
5.0000 mg | ORAL_TABLET | Freq: Two times a day (BID) | ORAL | Status: DC
  Filled 2013-02-13 (×2): qty 1

## 2013-02-13 MED ORDER — INSULIN DETEMIR 100 UNIT/ML ~~LOC~~ SOLN
22.0000 [IU] | Freq: Every day | SUBCUTANEOUS | Status: DC
  Administered 2013-02-13 – 2013-02-17 (×6): 22 [IU] via SUBCUTANEOUS
  Filled 2013-02-13 (×8): qty 0.22

## 2013-02-13 MED ORDER — METFORMIN HCL 500 MG PO TABS
500.0000 mg | ORAL_TABLET | Freq: Two times a day (BID) | ORAL | Status: DC
  Administered 2013-02-13: 500 mg via ORAL
  Filled 2013-02-13 (×3): qty 1

## 2013-02-13 MED ORDER — SODIUM CHLORIDE 0.9 % IJ SOLN
3.0000 mL | Freq: Two times a day (BID) | INTRAMUSCULAR | Status: DC
  Administered 2013-02-13 (×2): 3 mL via INTRAVENOUS
  Administered 2013-02-13: 12:00:00 via INTRAVENOUS

## 2013-02-13 MED ORDER — GLIPIZIDE 10 MG PO TABS
10.0000 mg | ORAL_TABLET | Freq: Two times a day (BID) | ORAL | Status: DC
  Filled 2013-02-13 (×4): qty 1

## 2013-02-13 MED ORDER — LINAGLIPTIN 5 MG PO TABS
5.0000 mg | ORAL_TABLET | Freq: Every day | ORAL | Status: DC
  Administered 2013-02-13 – 2013-02-14 (×2): 5 mg via ORAL
  Filled 2013-02-13 (×2): qty 1

## 2013-02-13 NOTE — Progress Notes (Signed)
Pt vomiting yellow, thick emesis, Zofran given, pt stated no relief, MD aware, new orders given. Notified MD of elevated temp, pt symptomatic, new orders given per MD, will continue to monitor.

## 2013-02-13 NOTE — Progress Notes (Signed)
Utilization Review Completed.[name] [name] T6/22/2014  

## 2013-02-13 NOTE — Progress Notes (Signed)
Pt has defibrillator and can not have a MRI; I am canceling that order -Eustaquio Boyden [facility]

## 2013-02-13 NOTE — Progress Notes (Signed)
Pt called with c/o abd pain after  Lunch. No tenderness to touch. Requesting pain med, 2 acetominophen given po.

## 2013-02-13 NOTE — Progress Notes (Signed)
ANTIBIOTIC CONSULT NOTE - INITIAL  Pharmacy Consult for Levaquin Indication: rule out pneumonia  No Known Allergies  Patient Measurements: Height: 5\' 9"  (175.3 cm) Weight: 185 lb 3 oz (84 kg) (scale b) IBW/kg (Calculated) : 70.7   Vital Signs: Temp: 99.4 F (37.4 C) (06/22 0519) Temp src: Oral (06/22 0519) BP: 119/66 mmHg (06/22 0519) Pulse Rate: 87 (06/22 0519) Intake/Output from previous day: 06/21 0701 - 06/22 0700 In: 418 [P.O.:418] Out: 500 [Urine:500] Intake/Output from this shift: Total I/O In: 60 [P.O.:60] Out: -   Labs:  Recent Labs  02/12/13 1739 02/12/13 1745  WBC 3.1*  --   HGB 12.9* 14.3  PLT 157  --   CREATININE 1.21 1.40*   Estimated Creatinine Clearance: 49.8 ml/min (by C-G formula based on Cr of 1.4).   Microbiology: No results found for this or any previous visit (from the past 720 hour(s)).  Medical History: Past Medical History  Diagnosis Date  . Diabetes mellitus   . GERD (gastroesophageal reflux disease)   . Obesity   . Cardiomyopathy, nonischemic   . Chronic anticoagulation   . Hypertension   . ICD (implantable cardiac defibrillator) in place     has BiV in place.   . Acute on chronic diastolic heart failure     Assessment: 67 YOM brought in with fever and weakness, thought to be viral vs early PNA. Also with h/o AFib on chronic warfarin- INR therapeutic and home dose restarted. Now to start Levaquin for suspected CAP. SCr 1.4 and CrCl ~20mL/min. Afebrile now, WBC low at 3.1.  Goal of Therapy:  eradication of infection  Plan:  1. Start Levaquin 750mg  IV q24 hr- Would anticipate 5 day course for CAP 2. Follow INR closely as Levaquin can potentiate INR 3. Follow up cultures/sensitivities, renal function, and clinical status  Znya Albino D. Toris Laverdiere, PharmD Clinical Pharmacist Pager: [phone] 02/13/2013 9:53 AM

## 2013-02-13 NOTE — Progress Notes (Addendum)
[facility]                                                                                Patient Demographics  [name] [name], is a 67 y.o. male, DOB - Jan 31, 1946, ZOX:[id number], WJX:[id number]  Admit date - 02/12/2013  Admitting Physician [name], MD  Outpatient Primary MD for the patient is [name], MD  LOS - 1   Chief Complaint  Patient presents with  . Weakness        Assessment & Plan    1.Generalized weakness which is gradually progressive over the last several months, productive cough with low-grade fevers for the last few days, early dementia- patient likely has gradually progressive weakness and dementia over the last several months now worsened by acute bronchitis versus early cognitive for pneumonia, we will panculture him, will place him on empiric Levaquin, he clearly has no headache, has supple neck with stable exam, CT brain stable, will check MRI-A, TSH, B12 and RPR . Will have physical therapy evaluate him along with social worker for placement. Aspiration precautions and feeding assistance. He is at risk for delirium.Continue IV fluids for hydration.   2. History of left bundle branch block, chronic systolic heart failure, atrial fibrillation. Patient is status post pacemaker placement - no acute issues, goal will be rate controlled, continue his home dose Coreg, continue Coumadin to be monitored by pharmacy, will defer long-term anticoagulation use to his primary cardiologist and primary care physician in the light of his dementia.He appears compensated from CHF standpoint.   3. Hypertension continue home medications which include Coreg and ACE inhibitor and monitor.   4. Diabetes mellitus type 2. Home medications will be continued, have added premeal Novolog + sliding scale insulin to continue.  No results found for this basename: HGBA1C    CBG (last 3)   Recent Labs  02/12/13 2220 02/13/13 0131 02/13/13 0608  GLUCAP 301* 323*  320*     5.Mild hyponatremia. Clinically appears dehydrated, gentle IV fluids, hold diuretics, check urine sodium osmolality and serum osmolality. Repeat BMP in the morning.     Code Status: Full  Family Communication: With wife in detail  Disposition Plan: To be decided   Procedures CT scan head   Consults  Physical therapy   DVT Prophylaxis  Coumadin  Lab Results  Component Value Date   PLT 157 02/12/2013    Medications  Scheduled Meds: . aspirin  325 mg Oral Daily  . atorvastatin  10 mg Oral Daily  . carvedilol  12.5 mg Oral BID  . [START ON 02/14/2013] carvedilol  18.75 mg Oral Q1400  . docusate sodium  100 mg Oral BID  . glipiZIDE  5 mg Oral BID AC  . insulin aspart  0-9 Units Subcutaneous TID WC  . insulin detemir  22 Units Subcutaneous QHS  . irbesartan  75 mg Oral Daily  . linagliptin  5 mg Oral Daily  . metFORMIN  500 mg Oral BID WC  . pantoprazole  40 mg Oral Daily  . rosiglitazone  2 mg Oral Daily  . sodium chloride  3 mL Intravenous Q12H  . sodium chloride  3 mL Intravenous Q12H  .  warfarin  10 mg Oral Q T,Th,S,Su-1800  . [START ON 02/14/2013] warfarin  15 mg Oral Q M,W,F-1800  . Warfarin - Pharmacist Dosing Inpatient   Does not apply q1800   Continuous Infusions:  PRN Meds:.sodium chloride, acetaminophen, ondansetron (ZOFRAN) IV, ondansetron, sodium chloride  Antibiotics     Anti-infectives   None       Time Spent in minutes   35   Susa Raring K M.D on 02/13/2013 at 9:45 AM  Between 7am to 7pm - Pager - [phone]  After 7pm go to [email or web address] - password [email or web address]  And look for the night coverage person covering for me after hours  [facility] Office  [phone]    Subjective:   [name] [name] today has, No headache, No chest pain, No abdominal pain - No Nausea, No new weakness tingling or numbness, Positive cough Cough - But no SOB.    Objective:   Filed Vitals:   02/13/13 0020 02/13/13 0040 02/13/13 0120  02/13/13 0519  BP: 103/47  124/61 119/66  Pulse: 88  84 87  Temp: 98.5 F (36.9 C)  100.9 F (38.3 C) 99.4 F (37.4 C)  TempSrc: Oral  Oral Oral  Resp: 23  22 22   Height:  5\' 9"  (1.753 m)    Weight:  84.052 kg (185 lb 4.8 oz)  84 kg (185 lb 3 oz)  SpO2: 93%  93%     Wt Readings from Last 3 Encounters:  02/13/13 84 kg (185 lb 3 oz)  07/20/12 92.534 kg (204 lb)  06/19/11 93.35 kg (205 lb 12.8 oz)     Intake/Output Summary (Last 24 hours) at 02/13/13 0945 Last data filed at 02/13/13 0800  Gross per 24 hour  Intake    478 ml  Output    500 ml  Net    -22 ml    Exam Awake Alert, Oriented X 2, No new F.N deficits, Normal affect, Patient has negative Kernig's and Brudzinski sign. Neck is supple. Foley.AT,PERRAL No JVD, No cervical lymphadenopathy appriciated.  Symmetrical Chest wall movement, Good air movement bilaterally, CTAB RRR,No Gallops,Rubs or new Murmurs, No Parasternal Heave +ve B.Sounds, Abd Soft, Non tender, No organomegaly appriciated, No rebound - guarding or rigidity. No Cyanosis, Clubbing or edema, No new Rash or bruise      Data Review   Micro Results No results found for this or any previous visit (from the past 240 hour(s)).  Radiology Reports Dg Chest 2 View  02/12/2013   *RADIOLOGY REPORT*  Clinical Data: Weakness, fall, numbness in feet, headache, nausea, history diabetes, hypertension, nonischemic cardiomyopathy  CHEST - 2 VIEW  Comparison: 12/22/2003  Findings: Left subclavian pacemaker / AICD leads project at right atrium, right ankle, and coronary sinus. Upper normal heart size. Normal mediastinal contours and pulmonary vascularity for AP technique. Lungs appear hyperinflated without infiltrate, pleural effusion or pneumothorax. Minimal peribronchial thickening. No acute osseous findings.  IMPRESSION: Post AICD. Bronchitic changes and hyperinflation without acute infiltrate.   Original Report Authenticated By: Ulyses Southward, M.D.   Ct Head Wo  Contrast  02/12/2013   *RADIOLOGY REPORT*  Clinical Data: Frequent falls with unsteady gait and confusion.  CT HEAD WITHOUT CONTRAST  Technique:  Contiguous axial images were obtained from the base of the skull through the vertex without contrast.  Comparison: None.  Findings: Mild to moderate atrophy.  Chronic microvascular ischemic change affects the periventricular and subcortical white matter. No acute stroke or hemorrhage.  No mass lesion or hydrocephalus. No extra-axial fluid.  Small calcifications in the mid pons, likely dystrophic from a small vascular malformation or remote infarction. No evidence for pontine mass.  Advanced vascular calcification. Calvarium intact.  Clear sinuses and mastoids.  IMPRESSION: Mild to moderate atrophy and chronic microvascular ischemic change. No acute intracranial findings.   Original Report Authenticated By: Davonna Belling, M.D.    CBC  Recent Labs Lab 02/12/13 1739 02/12/13 1745  WBC 3.1*  --   HGB 12.9* 14.3  HCT 38.2* 42.0  PLT 157  --   MCV 73.9*  --   MCH 25.0*  --   MCHC 33.8  --   RDW 15.7*  --   LYMPHSABS 0.1*  --   MONOABS 0.2  --   EOSABS 0.0  --   BASOSABS 0.0  --     Chemistries   Recent Labs Lab 02/12/13 1739 02/12/13 1745  NA 129* 132*  K 3.9 4.0  CL 94* 97  CO2 24  --   GLUCOSE 453* 459*  BUN 21 21  CREATININE 1.21 1.40*  CALCIUM 9.4  --   AST 21  --   ALT 25  --   ALKPHOS 101  --   BILITOT 0.2*  --    ------------------------------------------------------------------------------------------------------------------ estimated creatinine clearance is 49.8 ml/min (by C-G formula based on Cr of 1.4). ------------------------------------------------------------------------------------------------------------------ No results found for this basename: HGBA1C,  in the last 72 hours ------------------------------------------------------------------------------------------------------------------ No results found for this  basename: CHOL, HDL, LDLCALC, TRIG, CHOLHDL, LDLDIRECT,  in the last 72 hours ------------------------------------------------------------------------------------------------------------------ No results found for this basename: TSH, T4TOTAL, FREET3, T3FREE, THYROIDAB,  in the last 72 hours ------------------------------------------------------------------------------------------------------------------ No results found for this basename: VITAMINB12, FOLATE, FERRITIN, TIBC, IRON, RETICCTPCT,  in the last 72 hours  Coagulation profile  Recent Labs Lab 02/12/13 1739 02/13/13 0545  INR 2.25* 2.18*    No results found for this basename: DDIMER,  in the last 72 hours  Cardiac Enzymes  Recent Labs Lab 02/12/13 1739  TROPONINI <0.30   ------------------------------------------------------------------------------------------------------------------ No components found with this basename: POCBNP,

## 2013-02-13 NOTE — Progress Notes (Signed)
Weak. Wife @ bedside. CBG & Temp. Improving.

## 2013-02-13 NOTE — Evaluation (Signed)
Physical Therapy Evaluation Patient Details Name: [name] [name] MRN: [id number] DOB: 08/01/1946 Today's Date: 02/13/2013 Time: 4098-1191 PT Time Calculation (min): 18 min  PT Assessment / Plan / Recommendation Clinical Impression  Pt is a 67 y.o. male adm to [facility] due to syncope episode resulting in fall and generalized LE weakness. Pt with reported recurrent falls at home. Has dementia and wife reports was independent prior to this episode. Pt presents with mobility deficits, ataxic gt, coordination deficits and has difficulty tracking with eyes. Will benefit from skilled PT to maximize functional mobility and increase independence and safety with transfers. Pt and wife plan to D/C home. Pt will be alone 4hours daily while wife is at work. Will need (A) during this time due to fall risk. Recommended using RW and having HHPT upon acute D/C and wife was agreeable.      PT Assessment  Patient needs continued PT services    Follow Up Recommendations  Home health PT;Supervision/Assistance - 24 hour    Does the patient have the potential to tolerate intense rehabilitation      Barriers to Discharge Inaccessible home environment;Decreased caregiver support pt will be alone 4 hours a day and has steps to enter into house     Equipment Recommendations  Rolling walker with 5" wheels    Recommendations for Other Services OT consult   Frequency Min 4X/week    Precautions / Restrictions Precautions Precautions: Fall Precaution Comments: recurrent falls ?  Restrictions Weight Bearing Restrictions: No   Pertinent Vitals/Pain C/o headache; did not rate pain RN notified       Mobility  Bed Mobility Bed Mobility: Not assessed (pt sitting in chair ) Transfers Transfers: Sit to Stand;Stand to Sit Sit to Stand: With armrests;With upper extremity assist;From chair/3-in-1;3: Mod assist Stand to Sit: 4: Min assist;With upper extremity assist;With armrests;To chair/3-in-1 Details for  Transfer Assistance: pt with difficulty balancing when up to stand; tends to push posteriorly; required verbal cues and tactile cues for hand placement and safety with RW  Ambulation/Gait Ambulation/Gait Assistance: 4: Min assist Ambulation Distance (Feet): 50 Feet Assistive device: Rolling walker Ambulation/Gait Assistance Details: pt with ataxic gt; required verbal cues for safety with RW; tends to pick up RW vs pushing it; pt unsteady during gt requires (A) to maintain balance; becomes distracted when amb easily  Gait Pattern: Decreased stride length;Ataxic;Narrow base of support Gait velocity: decreased  Stairs: No Wheelchair Mobility Wheelchair Mobility: No    Exercises     PT Diagnosis: Abnormality of gait  PT Problem List: Decreased balance;Decreased mobility;Decreased knowledge of use of DME;Decreased safety awareness;Impaired sensation PT Treatment Interventions: DME instruction;Gait training;Stair training;Functional mobility training;Therapeutic activities;Therapeutic exercise;Balance training;Neuromuscular re-education;Patient/family education   PT Goals Acute Rehab PT Goals PT Goal Formulation: With patient Time For Goal Achievement: 02/19/13 Potential to Achieve Goals: Good Pt will go Supine/Side to Sit: with modified independence PT Goal: Supine/Side to Sit - Progress: Goal set today Pt will go Sit to Supine/Side: with modified independence PT Goal: Sit to Supine/Side - Progress: Goal set today Pt will go Sit to Stand: with modified independence PT Goal: Sit to Stand - Progress: Goal set today Pt will go Stand to Sit: with modified independence PT Goal: Stand to Sit - Progress: Goal set today Pt will Ambulate: >150 feet;with modified independence;with rolling walker PT Goal: Ambulate - Progress: Goal set today Pt will Go Up / Down Stairs: 3-5 stairs;with supervision;with rolling walker;with least restrictive assistive device PT Goal: Up/Down Stairs - Progress: Goal  set today  Visit Information  Last PT Received On: 02/13/13 Assistance Needed: +1    Subjective Data  Subjective: Pt sitting in recliner with wife present; c/o headache  Patient Stated Goal: to get a cold pepsi   Prior Functioning  Home Living Lives With: Spouse Available Help at Discharge: Family;Available PRN/intermittently Type of Home: House Home Access: Stairs to enter [employer] of Steps: 3 Entrance Stairs-Rails: None Home Layout: Two level;Able to live on main level with bedroom/bathroom Alternate Level Stairs-Number of Steps: 16 Alternate Level Stairs-Rails: Right Bathroom Shower/Tub: Tub/shower unit;Curtain Bathroom Toilet: Handicapped height Bathroom Accessibility: No Home Adaptive Equipment: None Prior Function Level of Independence: Independent Able to Take Stairs?: Yes Driving: Yes 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  Communication Communication: No difficulties    Cognition  Cognition Arousal/Alertness: Awake/alert Behavior During Therapy: Flat affect Overall Cognitive Status: Within Functional Limits for tasks assessed Area of Impairment: Safety/judgement;Problem solving;Attention Current Attention Level: Alternating Safety/Judgement: Decreased awareness of safety Problem Solving: Slow processing;Difficulty sequencing;Requires verbal cues;Requires tactile cues;Decreased initiation General Comments: wife concerned that pt seems "off' and "different than usua;" requires redirection during therapy session and simple one step commands     Extremity/Trunk Assessment Right Upper Extremity Assessment RUE ROM/Strength/Tone: [facility] for tasks assessed RUE Coordination: Deficits RUE Coordination Deficits: decreased finger to nose; demo some dysmetria  Left Upper Extremity Assessment LUE ROM/Strength/Tone: WFL for tasks assessed LUE Coordination: Deficits LUE Coordination Deficits: demo some dysmetria with finger to nose test  Right Lower Extremity  Assessment RLE ROM/Strength/Tone: [facility] for tasks assessed (grossy 4/5 ) RLE Sensation: Deficits RLE Sensation Deficits: pt c/o bil numbness and tingling from time to time; no neuropathy medicince ever given per wife  Left Lower Extremity Assessment LLE ROM/Strength/Tone: [facility] for tasks assessed LLE Sensation: Deficits LLE Sensation Deficits: c/o numbness and tingling for weeks  Trunk Assessment Trunk Assessment: Normal   Balance Balance Balance Assessed: Yes Static Standing Balance Static Standing - Balance Support: Bilateral upper extremity supported;During functional activity Static Standing - Level of Assistance: 3: Mod assist;4: Min assist Static Standing - Comment/# of Minutes: requires min to mod (A) to maintain balance with inital sit to stand transfer due to posterior lean   End of Session PT - End of Session Equipment Utilized During Treatment: Gait belt Activity Tolerance: Patient tolerated treatment well Patient left: in chair;with call bell/phone within reach;with family/visitor present Nurse Communication: Mobility status;Other (comment) (assessment and concern to rule out possible CVA ? )  GP     Donell Sievert,  [place] [phone] 02/13/2013, 11:57 AM

## 2013-02-13 NOTE — Progress Notes (Signed)
ANTICOAGULATION CONSULT NOTE - Initial Consult  Pharmacy Consult for Coumadin Indication: atrial fibrillation  No Known Allergies   Vital Signs: Temp: 98.5 F (36.9 C) (06/22 0020) Temp src: Oral (06/22 0020) BP: 103/47 mmHg (06/22 0020) Pulse Rate: 88 (06/22 0020)  Labs:  Recent Labs  02/12/13 1739 02/12/13 1745  HGB 12.9* 14.3  HCT 38.2* 42.0  PLT 157  --   APTT 57*  --   LABPROT 23.9*  --   INR 2.25*  --   CREATININE 1.21 1.40*  TROPONINI <0.30  --      Medical History: Past Medical History  Diagnosis Date  . Diabetes mellitus   . GERD (gastroesophageal reflux disease)   . Obesity   . Cardiomyopathy, nonischemic   . Chronic anticoagulation   . Hypertension   . ICD (implantable cardiac defibrillator) in place     has BiV in place.   . Acute on chronic diastolic heart failure     Medications:  Prescriptions prior to admission  Medication Sig Dispense Refill  . aspirin 325 MG tablet Take 325 mg by mouth daily.        [name] atorvastatin (LIPITOR) 10 MG tablet Take 10 mg by mouth daily.        . carvedilol (COREG) 12.5 MG tablet Take 12.5-18.75 mg by mouth 3 (three) times daily with meals. 1 tablet in the morning; 1 and a half tablet in the afternoon; 1 tablet at night      . esomeprazole (NEXIUM) 40 MG capsule Take 40 mg by mouth as needed (acid reflux).       . furosemide (LASIX) 40 MG tablet Take 40 mg by mouth 2 (two) times daily.      [name] glipiZIDE (GLUCOTROL) 10 MG tablet Take 10 mg by mouth 2 (two) times daily before a meal.      . insulin detemir (LEVEMIR) 100 UNIT/ML injection Inject 22 Units into the skin at bedtime. As directed      . metFORMIN (GLUCOPHAGE) 1000 MG tablet Take 500 mg by mouth 2 (two) times daily with a meal.       . pantoprazole (PROTONIX) 40 MG tablet Take 40 mg by mouth daily.        . rosiglitazone (AVANDIA) 2 MG tablet Take 2 mg by mouth daily.        . sitaGLIPtin (JANUVIA) 100 MG tablet Take 100 mg by mouth daily.      [name]  spironolactone (ALDACTONE) 25 MG tablet Take 0.5 tablets (12.5 mg total) by mouth daily.  90 tablet  0  . valsartan (DIOVAN) 80 MG tablet Take 80 mg by mouth every morning.      . warfarin (COUMADIN) 5 MG tablet Take 10-15 mg by mouth daily. 15mg  Monday Wednesday, Friday; 10mg  the rest of the week       Scheduled:  . aspirin  325 mg Oral Daily  . atorvastatin  10 mg Oral Daily  . carvedilol  12.5 mg Oral BID  . [START ON 02/14/2013] carvedilol  18.75 mg Oral Q1400  . docusate sodium  100 mg Oral BID  . glipiZIDE  10 mg Oral BID AC  . insulin aspart  0-5 Units Subcutaneous QHS  . insulin aspart  0-9 Units Subcutaneous TID WC  . insulin detemir  22 Units Subcutaneous QHS  . irbesartan  75 mg Oral Daily  . linagliptin  5 mg Oral Daily  . metFORMIN  500 mg Oral BID WC  . pantoprazole  40  mg Oral Daily  . pantoprazole  40 mg Oral Daily  . rosiglitazone  2 mg Oral Daily  . sodium chloride  3 mL Intravenous Q12H  . sodium chloride  3 mL Intravenous Q12H  . warfarin  10 mg Oral NOW  . warfarin  10 mg Oral Q T,Th,S,Su-1800  . [START ON 02/14/2013] warfarin  15 mg Oral Q M,W,F-1800  . Warfarin - Pharmacist Dosing Inpatient   Does not apply q1800    Assessment: 67yo male c/o fever and generalized weakness, being evaluated for need for ABX, thought to be viral, to continue Coumadin for Afib; admitted w/ therapeutic INR.  Goal of Therapy:  INR 2-3   Plan:  Will continue home dose of Coumadin 10mg  TTSS and 15mg  MWF and monitor INR for dose adjustments.  Vernard Gambles, PharmD, BCPS  02/13/2013,12:52 AM

## 2013-02-14 ENCOUNTER — Inpatient Hospital Stay (HOSPITAL_COMMUNITY): Payer: Medicare Other

## 2013-02-14 LAB — COMPREHENSIVE METABOLIC PANEL
ALT: 44 U/L (ref 0–53)
Albumin: 3 g/dL — ABNORMAL LOW (ref 3.5–5.2)
Alkaline Phosphatase: 74 U/L (ref 39–117)
BUN: 22 mg/dL (ref 6–23)
Chloride: 101 mEq/L (ref 96–112)
Potassium: 3.9 mEq/L (ref 3.5–5.1)
Sodium: 135 mEq/L (ref 135–145)
Total Bilirubin: 0.3 mg/dL (ref 0.3–1.2)
Total Protein: 6.5 g/dL (ref 6.0–8.3)

## 2013-02-14 LAB — URINE CULTURE
Colony Count: NO GROWTH
Culture: NO GROWTH

## 2013-02-14 LAB — GLUCOSE, CAPILLARY: Glucose-Capillary: 250 mg/dL — ABNORMAL HIGH (ref 70–99)

## 2013-02-14 LAB — CBC
HCT: 34.8 % — ABNORMAL LOW (ref 39.0–52.0)
Hemoglobin: 11.5 g/dL — ABNORMAL LOW (ref 13.0–17.0)
MCHC: 33 g/dL (ref 30.0–36.0)
RDW: 15.9 % — ABNORMAL HIGH (ref 11.5–15.5)
WBC: 3.6 10*3/uL — ABNORMAL LOW (ref 4.0–10.5)

## 2013-02-14 LAB — PROTIME-INR
INR: 1.49 (ref 0.00–1.49)
Prothrombin Time: 17.6 seconds — ABNORMAL HIGH (ref 11.6–15.2)

## 2013-02-14 LAB — HEPARIN LEVEL (UNFRACTIONATED): Heparin Unfractionated: 0.46 IU/mL (ref 0.30–0.70)

## 2013-02-14 MED ORDER — METRONIDAZOLE IN NACL 5-0.79 MG/ML-% IV SOLN
500.0000 mg | Freq: Three times a day (TID) | INTRAVENOUS | Status: DC
  Administered 2013-02-14 – 2013-02-18 (×13): 500 mg via INTRAVENOUS
  Filled 2013-02-14 (×14): qty 100

## 2013-02-14 MED ORDER — MORPHINE SULFATE 2 MG/ML IJ SOLN
2.0000 mg | INTRAMUSCULAR | Status: DC | PRN
  Administered 2013-02-14: 2 mg via INTRAVENOUS
  Filled 2013-02-14: qty 1

## 2013-02-14 MED ORDER — HYDRALAZINE HCL 20 MG/ML IJ SOLN: 10.0000 mg | Freq: Four times a day (QID) | INTRAMUSCULAR | Status: DC | PRN

## 2013-02-14 MED ORDER — INSULIN ASPART 100 UNIT/ML ~~LOC~~ SOLN: 0.0000 [IU] | Freq: Four times a day (QID) | SUBCUTANEOUS | Status: DC

## 2013-02-14 MED ORDER — INSULIN ASPART 100 UNIT/ML ~~LOC~~ SOLN
0.0000 [IU] | Freq: Four times a day (QID) | SUBCUTANEOUS | Status: DC
  Administered 2013-02-14: 3 [IU] via SUBCUTANEOUS
  Administered 2013-02-14 – 2013-02-15 (×3): 2 [IU] via SUBCUTANEOUS
  Administered 2013-02-15: 5 [IU] via SUBCUTANEOUS
  Administered 2013-02-15 (×2): 3 [IU] via SUBCUTANEOUS
  Administered 2013-02-16: 5 [IU] via SUBCUTANEOUS
  Administered 2013-02-16 (×2): 3 [IU] via SUBCUTANEOUS
  Administered 2013-02-17 (×2): 5 [IU] via SUBCUTANEOUS
  Administered 2013-02-17: 7 [IU] via SUBCUTANEOUS
  Administered 2013-02-17 – 2013-02-18 (×4): 5 [IU] via SUBCUTANEOUS

## 2013-02-14 MED ORDER — SODIUM CHLORIDE 0.9 % IV SOLN
8.0000 mg/h | INTRAVENOUS | Status: DC
  Administered 2013-02-14 – 2013-02-18 (×8): 8 mg/h via INTRAVENOUS
  Filled 2013-02-14 (×19): qty 80

## 2013-02-14 MED ORDER — BISACODYL 10 MG RE SUPP
10.0000 mg | Freq: Two times a day (BID) | RECTAL | Status: DC
  Administered 2013-02-14 – 2013-02-17 (×4): 10 mg via RECTAL
  Filled 2013-02-14 (×5): qty 1

## 2013-02-14 MED ORDER — HEPARIN (PORCINE) IN NACL 100-0.45 UNIT/ML-% IJ SOLN
1200.0000 [IU]/h | INTRAMUSCULAR | Status: DC
  Administered 2013-02-14: 1200 [IU]/h via INTRAVENOUS
  Filled 2013-02-14 (×2): qty 250

## 2013-02-14 MED ORDER — SODIUM CHLORIDE 0.9 % IV SOLN: 250.0000 mL | INTRAVENOUS | Status: DC | PRN

## 2013-02-14 MED ORDER — TECHNETIUM TC 99M MEBROFENIN IV KIT
5.0000 | PACK | Freq: Once | INTRAVENOUS | Status: AC | PRN
Start: 2013-02-14 — End: 2013-02-14
  Administered 2013-02-14: 5 via INTRAVENOUS

## 2013-02-14 MED ORDER — METOPROLOL TARTRATE 1 MG/ML IV SOLN: 5.0000 mg | INTRAVENOUS | Status: DC | PRN

## 2013-02-14 MED ORDER — SODIUM CHLORIDE 0.9 % IV BOLUS (SEPSIS)
500.0000 mL | Freq: Once | INTRAVENOUS | Status: AC
  Administered 2013-02-14: 500 mL via INTRAVENOUS

## 2013-02-14 MED ORDER — SODIUM CHLORIDE 0.9 % IV SOLN
80.0000 mg | Freq: Once | INTRAVENOUS | Status: AC
  Administered 2013-02-14: 80 mg via INTRAVENOUS
  Filled 2013-02-14: qty 80

## 2013-02-14 NOTE — Progress Notes (Signed)
Inpatient Diabetes Program Recommendations  AACE/ADA: New Consensus Statement on Inpatient Glycemic Control (2013)  Target Ranges:  Prepandial:   less than 140 mg/dL      Peak postprandial:   less than 180 mg/dL (1-2 hours)      Critically ill patients:  140 - 180 mg/dL     Results for RAINER, MOUNCE (MRN [id number]) as of 02/14/2013 11:52  Ref. Range 02/13/2013 01:31 02/13/2013 06:08 02/13/2013 10:43 02/13/2013 15:31 02/13/2013 22:11  Glucose-Capillary Latest Range: 70-99 mg/dL 147 (H) 829 (H) 562 (H) 187 (H) 227 (H)    Results for ALTARIQ, GOODALL (MRN [id number]) as of 02/14/2013 11:52  Ref. Range 02/14/2013 06:51 02/14/2013 11:42  Glucose-Capillary Latest Range: 70-99 mg/dL 696 (H) 295 (H)    Recommend the following in-hospital insulin adjustments:  1. Change Novolog Sensitive correction scale (SSI) to Q4 hour coverage- (currently ordered as Q6 hour coverage) 2. Stop oral DM medications if PO intake poor 3. Increase Levemir to 25 units QHS   Will follow. Ambrose Finland RN, MSN, CDE Diabetes Coordinator Inpatient Diabetes Program [phone]

## 2013-02-14 NOTE — Progress Notes (Signed)
ANTICOAGULATION CONSULT NOTE - Initial Consult  Pharmacy Consult for Heparin Indication: atrial fibrillation  No Known Allergies  Patient Measurements: Height: 5\' 9"  (175.3 cm) Weight: 185 lb 10 oz (84.2 kg) IBW/kg (Calculated) : 70.7 Heparin Dosing Weight: 84 kg Vital Signs: Temp: 99.3 F (37.4 C) (06/23 0616) Temp src: Oral (06/23 0616) BP: 123/57 mmHg (06/23 0616) Pulse Rate: 93 (06/23 0616) Labs:  Recent Labs  02/12/13 1739 02/12/13 1745 02/13/13 0545 02/13/13 1556 02/14/13 0445  HGB 12.9* 14.3  --  11.1* 11.5*  HCT 38.2* 42.0  --  33.1* 34.8*  PLT 157  --   --  112* 98*  APTT 57*  --   --   --   --   LABPROT 23.9*  --  23.3*  --  17.6*  INR 2.25*  --  2.18*  --  1.49  CREATININE 1.21 1.40*  --  1.31 1.28  TROPONINI <0.30  --   --   --   --    Estimated Creatinine Clearance: 54.5 ml/min (by C-G formula based on Cr of 1.28).  Assessment: 67 yo male known to [place] for Levaquin and Coumadin dosing now converting to IV heparin and Coumadin is on hold. INR today is sub-therapeutic at 1.49 after 2 days of 10mg  per home dosing regimen. Patient with new complaints of abdominal pain and vomiting. Protonix drip ordered and GI consulted. Platelets are falling, 157 on admit >> 112 >> 98. Will monitor this closely with the addition of heparin therapy. No overt bleeding has been reported. Patient is npo at this time. Discussed patient with Dr. Thedore Mins- plans to continue on heparin for now pending GI consult and abdominal work-up.  Goal of Therapy:  Heparin level 0.3-0.7 units/ml Monitor platelets by anticoagulation protocol: Yes   Plan:  1. No heparin bolus secondary to elevated INR and platelets trending down.  2. Start Heparin at 1200 units/hr (12 mL/hr).  3. Please note that Coumadin can be given IV if needed while NPO.  4. Consider holding ASA as platelets trending down since admission. 5. Heparin level in 6 hours. Monitor CBC and heparin level daily.  6. Monitor for signs and  symptoms of bleeding.   Link Snuffer, PharmD, BCPS Clinical Pharmacist [phone] 02/14/2013,8:32 AM

## 2013-02-14 NOTE — Evaluation (Signed)
Occupational Therapy Evaluation Patient Details Name: [name] [name] MRN: [id number] DOB: April 08, 1946 Today's Date: 02/14/2013 Time: 4098-1191 OT Time Calculation (min): 10 min  OT Assessment / Plan / Recommendation Clinical Impression  Pt is a 67yr old male admitted secondary to progressive weakness, nausea, and vomiting.  Pt with possible cholecystitis, and sludgy gallbladder that may be causing issues.  Plan for further testing to evauate.  Currently pt limited to in bed activity secondary to level of sickness today.  Feel based on previous days activites and PT eval that pt will benefit from acute care OT to help increase overall independence for selfcare tasks.  Feel pt will need 24 hour supervison at home based on dementia and recent history of falls.      OT Assessment  Patient needs continued OT Services    Follow Up Recommendations  Home health OT;Supervision/Assistance - 24 hour    Barriers to Discharge Decreased caregiver support Wife works 4 hrs a day  Equipment Recommendations  3 in 1 bedside comode;Tub/shower bench       Frequency  Min 2X/week    Precautions / Restrictions Precautions Precautions: Fall Restrictions Weight Bearing Restrictions: No   Pertinent Vitals/Pain Pan 2/10 on the faces scale in his abdomen    ADL  Eating/Feeding: Simulated;Independent Where Assessed - Eating/Feeding: Bed level Grooming: Simulated;Supervision/safety Where Assessed - Grooming: Supine, head of bed up Upper Body Bathing: Simulated;Set up Where Assessed - Upper Body Bathing: Supine, head of bed up Transfers/Ambulation Related to ADLs: Unable to test this session. ADL Comments: Pt with limited OT evaluation this pm.  Attempted to get pt sitting EOB but nursing advised against based on pts sickness.  No orders noted for bedrest at this time but did not continue with sitting EOB based on nursing recommendations.      OT Diagnosis: Generalized weakness;Acute pain  OT Problem  List: Decreased strength;Decreased activity tolerance;Impaired balance (sitting and/or standing);Decreased safety awareness;Pain;Decreased knowledge of use of DME or AE OT Treatment Interventions: Self-care/ADL training;DME and/or AE instruction;Therapeutic activities;Patient/family education;Balance training   OT Goals Acute Rehab OT Goals OT Goal Formulation: With patient Time For Goal Achievement: 02/28/13 Potential to Achieve Goals: Good ADL Goals Pt Will Perform Grooming: with supervision;Supported;Standing at sink ADL Goal: Grooming - Progress: Goal set today Pt Will Perform Lower Body Bathing: with supervision;Sit to stand from bed;Supported ADL Goal: Lower Body Bathing - Progress: Goal set today Pt Will Perform Lower Body Dressing: with supervision;Sit to stand from bed ADL Goal: Lower Body Dressing - Progress: Goal set today Pt Will Transfer to Toilet: with supervision;with DME;Ambulation;3-in-1 ADL Goal: Toilet Transfer - Progress: Goal set today Pt Will Perform Toileting - Clothing Manipulation: with supervision;Sitting on 3-in-1 or toilet;Standing ADL Goal: Toileting - Clothing Manipulation - Progress: Goal set today Pt Will Perform Toileting - Hygiene: with supervision;Sit to stand from 3-in-1/toilet ADL Goal: Toileting - Hygiene - Progress: Goal set today Pt Will Perform Tub/Shower Transfer: with supervision;with DME;Ambulation ADL Goal: Tub/Shower Transfer - Progress: Goal set today  Visit Information  Last OT Received On: 02/14/13 Assistance Needed: +1    Subjective Data  Subjective: I had a fall at home on Saturday. Patient Stated Goal: Pt wants his pain to go away.   Prior Functioning     Home Living Lives With: Spouse Available Help at Discharge: Family;Available PRN/intermittently Type of Home: House Home Access: Stairs to enter [employer] of Steps: 3 Entrance Stairs-Rails: None Home Layout: Two level;Able to live on main level with  bedroom/bathroom  Alternate Level Stairs-Number of Steps: 16 Alternate Level Stairs-Rails: Right Bathroom Shower/Tub: Tub/shower unit;Curtain Bathroom Toilet: Handicapped height Bathroom Accessibility: No Home Adaptive Equipment: None Prior Function Level of Independence: Independent Able to Take Stairs?: Yes Driving: Yes Communication Communication: No difficulties Dominant Hand: Right         Vision/Perception Vision - History Baseline Vision: No visual deficits Patient Visual Report: No change from baseline Vision - Assessment Vision Assessment: Vision not tested Perception Perception: Not tested Praxis Praxis: Not tested   Cognition  Cognition Arousal/Alertness: Awake/alert Behavior During Therapy: WFL for tasks assessed/performed Overall Cognitive Status: History of cognitive impairments - at baseline Area of Impairment: Memory    Extremity/Trunk Assessment Right Upper Extremity Assessment RUE ROM/Strength/Tone: Within functional levels RUE Sensation: WFL - Light Touch RUE Coordination: Deficits RUE Coordination Deficits: Pt with slight dysmetria with finger to nose testing bilaterally. Left Upper Extremity Assessment LUE ROM/Strength/Tone: Within functional levels LUE Sensation: WFL - Light Touch LUE Coordination: Deficits LUE Coordination Deficits: Pt with slight dysmetria with finger to nose testing bilaterally.     Mobility Bed Mobility Bed Mobility: Not assessed        Balance Balance Balance Assessed: No   End of Session OT - End of Session Activity Tolerance: Patient limited by pain;Treatment limited secondary to medical complications (Comment) Patient left: in bed;with nursing in room;with call bell/phone within reach     [facility] OTR/L Pager number [phone] 02/14/2013, 1:53 PM

## 2013-02-14 NOTE — Progress Notes (Signed)
ANTICOAGULATION CONSULT NOTE - Follow Up Consult  Pharmacy Consult for heparin Indication: atrial fibrillation  No Known Allergies  Patient Measurements: Height: 5\' 9"  (175.3 cm) Weight: 185 lb 10 oz (84.2 kg) IBW/kg (Calculated) : 70.7 Heparin Dosing Weight: 84 kg  Vital Signs: Temp: 99.3 F (37.4 C) (06/23 1453) Temp src: Oral (06/23 1453) BP: 90/55 mmHg (06/23 1453) Pulse Rate: 86 (06/23 1453)  Labs:  Recent Labs  02/12/13 1739 02/12/13 1745 02/13/13 0545 02/13/13 1556 02/14/13 0445  HGB 12.9* 14.3  --  11.1* 11.5*  HCT 38.2* 42.0  --  33.1* 34.8*  PLT 157  --   --  112* 98*  APTT 57*  --   --   --   --   LABPROT 23.9*  --  23.3*  --  17.6*  INR 2.25*  --  2.18*  --  1.49  CREATININE 1.21 1.40*  --  1.31 1.28  TROPONINI <0.30  --   --   --   --     Estimated Creatinine Clearance: 54.5 ml/min (by C-G formula based on Cr of 1.28).   Medications:  Scheduled:  . aspirin  325 mg Oral Daily  . bisacodyl  10 mg Rectal BID  . carvedilol  12.5 mg Oral BID  . carvedilol  18.75 mg Oral Q1400  . docusate sodium  100 mg Oral BID  . insulin aspart  0-9 Units Subcutaneous Q6H  . insulin detemir  22 Units Subcutaneous QHS  . levofloxacin (LEVAQUIN) IV  750 mg Intravenous Q24H  . linagliptin  5 mg Oral Daily  . metronidazole  500 mg Intravenous Q8H  . rosiglitazone  2 mg Oral Daily  . sodium chloride  3 mL Intravenous Q12H   Infusions:  . heparin 1,200 Units/hr (02/14/13 1100)  . pantoprozole (PROTONIX) infusion 8 mg/hr (02/14/13 1025)    Assessment: 67 yo male with afib is currently on therapeutic heparin.  Heparin level was 0.46.  INR 1.49 Goal of Therapy:  Heparin level 0.3-0.7 units/ml Monitor platelets by anticoagulation protocol: Yes   Plan:  1) Continue heparin at 12000 units/hr. 2) Repeat a 6hr heparin level to confirm. 3) Daily heparin level and CBC  Orphia Mctigue, Tsz-Yin 02/14/2013,5:33 PM

## 2013-02-14 NOTE — Consult Note (Signed)
[facility] CONSULT Reason for consult: nausea and vomiting and fever as well as abdominal pain. Referring Physician: [facility]. PCP: Dr. Kristopher Glee [name] [name] is an 67 y.o. male.  HPI: patient was apparently in his usual state of health until the past several weeks according to his wife. He has become progressively confused over the past several days and since Saturday, about 2 days ago, has had persistent nausea and vomiting and been unable to keep down anything. He has been complaining vague abdominal pain was brought to the emergency room with the symptoms. The patient was felt that the emergency room to have fever and at that time there was no known abdominal pain nausea or vomiting noted with the wife said that was the reason he was brought in. His labs were remarkable for white count 3.6, essentially normal liver tests,, urinalysis remarkable for glycosuria. CT of the head revealed chronic changes but no acute findings. CT abdomen pelvis revealed a nodule at the tip of the tail of the pancreas that had been present from previous scans and was unchanged. There were some sludge in a gallbladder. There was no clear abnormality in the left lower quadrant. Per the patient's wife, no known family history of a gallbladder disease. He has a history of chronic reflux and has been on the Nexium for some time. He's never had diverticulitis. The patient states that his pain and discomfort has been on the left side and denies any right-sided abdominal pain going to the back. His main complaint is weakness, but sided pain and profound nausea. Due to concern over possible G.I. etiology of symptoms Flagyl was added to Levaquin recently. Patient denies melena or hematochezia. He said no hematemesis. He has not really been having any heartburn until today it has recently been started on protonix drip. Blood cultures are pending in urine culture was negative. Patient has a history of diabetes,  congestive heart failure and cardiomyopathy, atrial fibrillation requiring Coumadin, and has had placement of ICD. He has had a cough, to chest x-rays have been normal.  Past Medical History  Diagnosis Date  . Diabetes mellitus   . GERD (gastroesophageal reflux disease)   . Obesity   . Cardiomyopathy, nonischemic   . Chronic anticoagulation   . Hypertension   . ICD (implantable cardiac defibrillator) in place     has BiV in place.   . Acute on chronic diastolic heart failure     Past Surgical History  Procedure Laterality Date  . Cardiac catheterization  10/01/2001    THERE WAS GLOBAL HYPOKINESIS AND EF 25%. THERE APPEARED TO BE GLOBAL DECREASE IN WALL MOTION  . Lithotripsy  2001  . Cervical spine surgery  1994  . Cardiac defibrillator placement  06/2009    WITH GENERATOR REPLACED; BiV ICD  . US echocardiography  03/21/2008    EF 30-35%  . Cardiovascular stress test  03/20/2009    EF 33%    Family History  Problem Relation Age of Onset  . Heart disease Mother   . Hypertension Mother   . Diabetes Mother   . Diabetes Father     Social History:  reports that he has never smoked. He does not have any smokeless tobacco history on file. He reports that he does not drink alcohol or use illicit drugs.  Allergies: No Known Allergies  Medications; . aspirin  325 mg Oral Daily  . bisacodyl  10 mg Rectal BID  . carvedilol  12.5 mg Oral BID  .  carvedilol  18.75 mg Oral Q1400  . docusate sodium  100 mg Oral BID  . insulin aspart  0-9 Units Subcutaneous TID WC  . insulin aspart  5 Units Subcutaneous TID WC  . insulin detemir  22 Units Subcutaneous QHS  . levofloxacin (LEVAQUIN) IV  750 mg Intravenous Q24H  . linagliptin  5 mg Oral Daily  . metronidazole  500 mg Intravenous Q8H  . pantoprazole (PROTONIX) IV  80 mg Intravenous Once  . rosiglitazone  2 mg Oral Daily  . sodium chloride  3 mL Intravenous Q12H  . sodium chloride  3 mL Intravenous Q12H   PRN Meds sodium chloride,  acetaminophen, ondansetron (ZOFRAN) IV, promethazine, sodium chloride Results for orders placed during the hospital encounter of 02/12/13 (from the past 48 hour(s))  GLUCOSE, CAPILLARY     Status: Abnormal   Collection Time    02/12/13  5:27 PM      Result Value Range   Glucose-Capillary 439 (*) 70 - 99 mg/dL   Comment 1 Documented in Chart     Comment 2 Notify RN    PROTIME-INR     Status: Abnormal   Collection Time    02/12/13  5:39 PM      Result Value Range   Prothrombin Time 23.9 (*) 11.6 - 15.2 seconds   INR 2.25 (*) 0.00 - 1.49  APTT     Status: Abnormal   Collection Time    02/12/13  5:39 PM      Result Value Range   aPTT 57 (*) 24 - 37 seconds   Comment:            IF BASELINE aPTT IS ELEVATED,     SUGGEST PATIENT RISK ASSESSMENT     BE USED TO DETERMINE APPROPRIATE     ANTICOAGULANT THERAPY.  CBC     Status: Abnormal   Collection Time    02/12/13  5:39 PM      Result Value Range   WBC 3.1 (*) 4.0 - 10.5 K/uL   RBC 5.17  4.22 - 5.81 MIL/uL   Hemoglobin 12.9 (*) 13.0 - 17.0 g/dL   HCT 66.4 (*) 40.3 - 47.4 %   MCV 73.9 (*) 78.0 - 100.0 fL   MCH 25.0 (*) 26.0 - 34.0 pg   MCHC 33.8  30.0 - 36.0 g/dL   RDW 25.9 (*) 56.3 - 87.5 %   Platelets 157  150 - 400 K/uL  DIFFERENTIAL     Status: Abnormal   Collection Time    02/12/13  5:39 PM      Result Value Range   Neutrophils Relative % 89 (*) 43 - 77 %   Neutro Abs 2.7  1.7 - 7.7 K/uL   Lymphocytes Relative 4 (*) 12 - 46 %   Lymphs Abs 0.1 (*) 0.7 - 4.0 K/uL   Monocytes Relative 7  3 - 12 %   Monocytes Absolute 0.2  0.1 - 1.0 K/uL   Eosinophils Relative 0  0 - 5 %   Eosinophils Absolute 0.0  0.0 - 0.7 K/uL   Basophils Relative 0  0 - 1 %   Basophils Absolute 0.0  0.0 - 0.1 K/uL  COMPREHENSIVE METABOLIC PANEL     Status: Abnormal   Collection Time    02/12/13  5:39 PM      Result Value Range   Sodium 129 (*) 135 - 145 mEq/L   Potassium 3.9  3.5 - 5.1 mEq/L   Chloride 94 (*)  96 - 112 mEq/L   CO2 24  19 - 32  mEq/L   Glucose, Bld 453 (*) 70 - 99 mg/dL   BUN 21  6 - 23 mg/dL   Creatinine, Ser 1.61  0.50 - 1.35 mg/dL   Calcium 9.4  8.4 - 09.6 mg/dL   Total Protein 7.5  6.0 - 8.3 g/dL   Albumin 3.7  3.5 - 5.2 g/dL   AST 21  0 - 37 U/L   ALT 25  0 - 53 U/L   Alkaline Phosphatase 101  39 - 117 U/L   Total Bilirubin 0.2 (*) 0.3 - 1.2 mg/dL   GFR calc non Af Amer 59 (*) >90 mL/min   GFR calc Af Amer 69 (*) >90 mL/min   Comment:            The eGFR has been calculated     using the CKD EPI equation.     This calculation has not been     validated in all clinical     situations.     eGFR's persistently     <90 mL/min signify     possible Chronic Kidney Disease.  TROPONIN I     Status: None   Collection Time    02/12/13  5:39 PM      Result Value Range   Troponin I <0.30  <0.30 ng/mL   Comment:            Due to the release kinetics of cTnI,     a negative result within the first hours     of the onset of symptoms does not rule out     myocardial infarction with certainty.     If myocardial infarction is still suspected,     repeat the test at appropriate intervals.  POCT I-STAT TROPONIN I     Status: None   Collection Time    02/12/13  5:43 PM      Result Value Range   Troponin i, poc 0.03  0.00 - 0.08 ng/mL   Comment 3            Comment: Due to the release kinetics of cTnI,     a negative result within the first hours     of the onset of symptoms does not rule out     myocardial infarction with certainty.     If myocardial infarction is still suspected,     repeat the test at appropriate intervals.  POCT I-STAT, CHEM 8     Status: Abnormal   Collection Time    02/12/13  5:45 PM      Result Value Range   Sodium 132 (*) 135 - 145 mEq/L   Potassium 4.0  3.5 - 5.1 mEq/L   Chloride 97  96 - 112 mEq/L   BUN 21  6 - 23 mg/dL   Creatinine, Ser 0.45 (*) 0.50 - 1.35 mg/dL   Glucose, Bld 409 (*) 70 - 99 mg/dL   Calcium, Ion 8.11  9.14 - 1.30 mmol/L   TCO2 23  0 - 100 mmol/L    Hemoglobin 14.3  13.0 - 17.0 g/dL   HCT 78.2  95.6 - 21.3 %  URINALYSIS, ROUTINE W REFLEX MICROSCOPIC     Status: Abnormal   Collection Time    02/12/13  9:38 PM      Result Value Range   Color, Urine YELLOW  YELLOW   APPearance CLEAR  CLEAR   Specific Gravity, Urine 1.024  1.005 - 1.030   pH 5.5  5.0 - 8.0   Glucose, UA >1000 (*) NEGATIVE mg/dL   Hgb urine dipstick NEGATIVE  NEGATIVE   Bilirubin Urine NEGATIVE  NEGATIVE   Ketones, ur NEGATIVE  NEGATIVE mg/dL   Protein, ur NEGATIVE  NEGATIVE mg/dL   Urobilinogen, UA 0.2  0.0 - 1.0 mg/dL   Nitrite NEGATIVE  NEGATIVE   Leukocytes, UA NEGATIVE  NEGATIVE  URINE MICROSCOPIC-ADD ON     Status: None   Collection Time    02/12/13  9:38 PM      Result Value Range   WBC, UA 0-2  <3 WBC/hpf   RBC / HPF 0-2  <3 RBC/hpf  URINE CULTURE     Status: None   Collection Time    02/12/13  9:39 PM      Result Value Range   Specimen Description URINE, CLEAN CATCH     Special Requests NONE     Culture  Setup Time 02/13/2013 03:10     Colony Count NO GROWTH     Culture NO GROWTH     Report Status 02/14/2013 FINAL    CG4 I-STAT (LACTIC ACID)     Status: None   Collection Time    02/12/13 10:00 PM      Result Value Range   Lactic Acid, Venous 1.18  0.5 - 2.2 mmol/L  GLUCOSE, CAPILLARY     Status: Abnormal   Collection Time    02/12/13 10:20 PM      Result Value Range   Glucose-Capillary 301 (*) 70 - 99 mg/dL  GLUCOSE, CAPILLARY     Status: Abnormal   Collection Time    02/13/13  1:31 AM      Result Value Range   Glucose-Capillary 323 (*) 70 - 99 mg/dL   Comment 1 Notify RN    TSH     Status: None   Collection Time    02/13/13  5:45 AM      Result Value Range   TSH 0.582  0.350 - 4.500 uIU/mL  PROTIME-INR     Status: Abnormal   Collection Time    02/13/13  5:45 AM      Result Value Range   Prothrombin Time 23.3 (*) 11.6 - 15.2 seconds   INR 2.18 (*) 0.00 - 1.49  GLUCOSE, CAPILLARY     Status: Abnormal   Collection Time    02/13/13   6:08 AM      Result Value Range   Glucose-Capillary 320 (*) 70 - 99 mg/dL  URINALYSIS W MICROSCOPIC + REFLEX CULTURE     Status: Abnormal   Collection Time    02/13/13  9:53 AM      Result Value Range   Color, Urine YELLOW  YELLOW   APPearance CLEAR  CLEAR   Specific Gravity, Urine 1.021  1.005 - 1.030   pH 5.5  5.0 - 8.0   Glucose, UA >1000 (*) NEGATIVE mg/dL   Hgb urine dipstick NEGATIVE  NEGATIVE   Bilirubin Urine NEGATIVE  NEGATIVE   Ketones, ur NEGATIVE  NEGATIVE mg/dL   Protein, ur NEGATIVE  NEGATIVE mg/dL   Urobilinogen, UA 1.0  0.0 - 1.0 mg/dL   Nitrite NEGATIVE  NEGATIVE   Leukocytes, UA NEGATIVE  NEGATIVE   WBC, UA 0-2  <3 WBC/hpf  BLOOD GAS, ARTERIAL     Status: Abnormal   Collection Time    02/13/13 10:00 AM      Result Value Range   FIO2  0.21     pH, Arterial 7.456 (*) 7.350 - 7.450   pCO2 arterial 34.7 (*) 35.0 - 45.0 mmHg   pO2, Arterial 54.1 (*) 80.0 - 100.0 mmHg   Bicarbonate 24.1 (*) 20.0 - 24.0 mEq/L   TCO2 25.1  0 - 100 mmol/L   Acid-Base Excess 0.6  0.0 - 2.0 mmol/L   O2 Saturation 90.2     Patient temperature 98.6     Collection site RIGHT RADIAL     Drawn by [phone]     Sample type ARTERIAL DRAW     Allens test (pass/fail) PASS  PASS  GLUCOSE, CAPILLARY     Status: Abnormal   Collection Time    02/13/13 10:43 AM      Result Value Range   Glucose-Capillary 255 (*) 70 - 99 mg/dL   Comment 1 Notify RN    CULTURE, BLOOD (ROUTINE X 2)     Status: None   Collection Time    02/13/13 10:50 AM      Result Value Range   Specimen Description BLOOD LEFT ARM     Special Requests BOTTLES DRAWN AEROBIC ONLY 10CC     Culture  Setup Time 02/13/2013 17:47     Culture       Value:        BLOOD CULTURE RECEIVED NO GROWTH TO DATE CULTURE WILL BE HELD FOR 5 DAYS BEFORE ISSUING A FINAL NEGATIVE REPORT   Report Status PENDING    AMMONIA     Status: None   Collection Time    02/13/13 10:50 AM      Result Value Range   Ammonia 17  11 - 60 umol/L  OSMOLALITY      Status: None   Collection Time    02/13/13 10:50 AM      Result Value Range   Osmolality 292  275 - 300 mOsm/kg  VITAMIN B12     Status: None   Collection Time    02/13/13 10:50 AM      Result Value Range   Vitamin B-12 838  211 - 911 pg/mL  RPR     Status: None   Collection Time    02/13/13 10:50 AM      Result Value Range   RPR NON REACTIVE  NON REACTIVE  CULTURE, BLOOD (ROUTINE X 2)     Status: None   Collection Time    02/13/13 10:58 AM      Result Value Range   Specimen Description BLOOD LEFT ARM     Special Requests BOTTLES DRAWN AEROBIC ONLY 10CC     Culture  Setup Time 02/13/2013 17:47     Culture       Value:        BLOOD CULTURE RECEIVED NO GROWTH TO DATE CULTURE WILL BE HELD FOR 5 DAYS BEFORE ISSUING A FINAL NEGATIVE REPORT   Report Status PENDING    CREATININE, URINE, RANDOM     Status: None   Collection Time    02/13/13 12:03 PM      Result Value Range   Creatinine, Urine 78.50    OSMOLALITY, URINE     Status: None   Collection Time    02/13/13 12:03 PM      Result Value Range   Osmolality, Ur 500  390 - 1090 mOsm/kg  SODIUM, URINE, RANDOM     Status: None   Collection Time    02/13/13 12:03 PM      Result Value Range  Sodium, Ur 33    GLUCOSE, CAPILLARY     Status: Abnormal   Collection Time    02/13/13  3:31 PM      Result Value Range   Glucose-Capillary 187 (*) 70 - 99 mg/dL  CBC     Status: Abnormal   Collection Time    02/13/13  3:56 PM      Result Value Range   WBC 4.0  4.0 - 10.5 K/uL   RBC 4.47  4.22 - 5.81 MIL/uL   Hemoglobin 11.1 (*) 13.0 - 17.0 g/dL   Comment: DELTA CHECK NOTED     REPEATED TO VERIFY   HCT 33.1 (*) 39.0 - 52.0 %   MCV 74.0 (*) 78.0 - 100.0 fL   MCH 24.8 (*) 26.0 - 34.0 pg   MCHC 33.5  30.0 - 36.0 g/dL   RDW 45.4 (*) 09.8 - 11.9 %   Platelets 112 (*) 150 - 400 K/uL   Comment: DELTA CHECK NOTED     PLATELET COUNT CONFIRMED BY SMEAR     SPECIMEN CHECKED FOR CLOTS  COMPREHENSIVE METABOLIC PANEL     Status: Abnormal    Collection Time    02/13/13  3:56 PM      Result Value Range   Sodium 135  135 - 145 mEq/L   Potassium 3.9  3.5 - 5.1 mEq/L   Chloride 101  96 - 112 mEq/L   CO2 23  19 - 32 mEq/L   Glucose, Bld 175 (*) 70 - 99 mg/dL   BUN 19  6 - 23 mg/dL   Creatinine, Ser 1.47  0.50 - 1.35 mg/dL   Calcium 8.3 (*) 8.4 - 10.5 mg/dL   Total Protein 6.2  6.0 - 8.3 g/dL   Albumin 2.7 (*) 3.5 - 5.2 g/dL   AST 31  0 - 37 U/L   ALT 26  0 - 53 U/L   Alkaline Phosphatase 66  39 - 117 U/L   Total Bilirubin 0.2 (*) 0.3 - 1.2 mg/dL   GFR calc non Af Amer 54 (*) >90 mL/min   GFR calc Af Amer 62 (*) >90 mL/min   Comment:            The eGFR has been calculated     using the CKD EPI equation.     This calculation has not been     validated in all clinical     situations.     eGFR's persistently     <90 mL/min signify     possible Chronic Kidney Disease.  LIPASE, BLOOD     Status: None   Collection Time    02/13/13  3:56 PM      Result Value Range   Lipase 16  11 - 59 U/L  GLUCOSE, CAPILLARY     Status: Abnormal   Collection Time    02/13/13 10:11 PM      Result Value Range   Glucose-Capillary 227 (*) 70 - 99 mg/dL  PROTIME-INR     Status: Abnormal   Collection Time    02/14/13  4:45 AM      Result Value Range   Prothrombin Time 17.6 (*) 11.6 - 15.2 seconds   INR 1.49  0.00 - 1.49  CBC     Status: Abnormal   Collection Time    02/14/13  4:45 AM      Result Value Range   WBC 3.6 (*) 4.0 - 10.5 K/uL   RBC 4.66  4.22 -  5.81 MIL/uL   Hemoglobin 11.5 (*) 13.0 - 17.0 g/dL   HCT 82.9 (*) 56.2 - 13.0 %   MCV 74.7 (*) 78.0 - 100.0 fL   MCH 24.7 (*) 26.0 - 34.0 pg   MCHC 33.0  30.0 - 36.0 g/dL   RDW 86.5 (*) 78.4 - 69.6 %   Platelets 98 (*) 150 - 400 K/uL   Comment: CONSISTENT WITH PREVIOUS RESULT  COMPREHENSIVE METABOLIC PANEL     Status: Abnormal   Collection Time    02/14/13  4:45 AM      Result Value Range   Sodium 135  135 - 145 mEq/L   Potassium 3.9  3.5 - 5.1 mEq/L   Chloride 101  96 -  112 mEq/L   CO2 21  19 - 32 mEq/L   Glucose, Bld 291 (*) 70 - 99 mg/dL   BUN 22  6 - 23 mg/dL   Creatinine, Ser 2.95  0.50 - 1.35 mg/dL   Calcium 8.5  8.4 - 28.4 mg/dL   Total Protein 6.5  6.0 - 8.3 g/dL   Albumin 3.0 (*) 3.5 - 5.2 g/dL   AST 59 (*) 0 - 37 U/L   ALT 44  0 - 53 U/L   Alkaline Phosphatase 74  39 - 117 U/L   Total Bilirubin 0.3  0.3 - 1.2 mg/dL   GFR calc non Af Amer 55 (*) >90 mL/min   GFR calc Af Amer 64 (*) >90 mL/min   Comment:            The eGFR has been calculated     using the CKD EPI equation.     This calculation has not been     validated in all clinical     situations.     eGFR's persistently     <90 mL/min signify     possible Chronic Kidney Disease.  GLUCOSE, CAPILLARY     Status: Abnormal   Collection Time    02/14/13  6:51 AM      Result Value Range   Glucose-Capillary 279 (*) 70 - 99 mg/dL   Comment 1 Notify RN     Comment 2 Documented in Chart      Dg Chest 2 View  02/12/2013   *RADIOLOGY REPORT*  Clinical Data: Weakness, fall, numbness in feet, headache, nausea, history diabetes, hypertension, nonischemic cardiomyopathy  CHEST - 2 VIEW  Comparison: 12/22/2003  Findings: Left subclavian pacemaker / AICD leads project at right atrium, right ankle, and coronary sinus. Upper normal heart size. Normal mediastinal contours and pulmonary vascularity for AP technique. Lungs appear hyperinflated without infiltrate, pleural effusion or pneumothorax. Minimal peribronchial thickening. No acute osseous findings.  IMPRESSION: Post AICD. Bronchitic changes and hyperinflation without acute infiltrate.   Original Report Authenticated By: Ulyses Southward, M.D.   Ct Head Wo Contrast  02/12/2013   *RADIOLOGY REPORT*  Clinical Data: Frequent falls with unsteady gait and confusion.  CT HEAD WITHOUT CONTRAST  Technique:  Contiguous axial images were obtained from the base of the skull through the vertex without contrast.  Comparison: None.  Findings: Mild to moderate atrophy.   Chronic microvascular ischemic change affects the periventricular and subcortical white matter. No acute stroke or hemorrhage.  No mass lesion or hydrocephalus. No extra-axial fluid.  Small calcifications in the mid pons, likely dystrophic from a small vascular malformation or remote infarction. No evidence for pontine mass.  Advanced vascular calcification. Calvarium intact.  Clear sinuses and mastoids.  IMPRESSION: Mild to  moderate atrophy and chronic microvascular ischemic change. No acute intracranial findings.   Original Report Authenticated By: Davonna Belling, M.D.   Ct Abdomen Pelvis W Contrast  02/13/2013   *RADIOLOGY REPORT*  Clinical Data: Fever, nausea, and vomiting  CT ABDOMEN AND PELVIS WITH CONTRAST  Technique:  Multidetector CT imaging of the abdomen and pelvis was performed following the standard protocol during bolus administration of intravenous contrast.  Contrast: OMNIPAQUE IOHEXOL 300 MG/ML  SOLN  Comparison: 03/10/2012  Findings: There is atelectasis noted in both lung bases.  No pericardial or pleural effusion.  No focal liver abnormalities identified.  Sludge versus tiny stones noted within the dependent portion of the gallbladder.  The pancreas is unremarkable.  There is a enhancing nodule in the distal tip of tail of pancreas this measures 1.3 cm and is unchanged from previous exam.  There are several small splenules adjacent to the inferior aspect of the spleen.  The adrenal glands both appear normal.  The right kidney is normal. The left kidney is normal.  No obstructive uropathy noted.  Urinary bladder is unremarkable.  Abdominal aorta appears normal in caliber.  No aneurysm.  There is mild calcified atherosclerotic disease identified.  No upper abdominal adenopathy identified.  There is no pelvic or inguinal adenopathy.  The stomach appears normal.  The small bowel loops are within normal limits and caliber.  No evidence for obstruction.  The colon appears normal.  There is no  free fluid or abnormal fluid collections identified within the abdomen or pelvis.  There is a periumbilical hernia which contains fat only.  Review of the visualized osseous structures is significant for mild lumbar degenerative disc disease.  IMPRESSION:  1.  No acute findings identified within the abdomen or pelvis. 2.  Sludge versus tiny stones within the dependent portion of the gallbladder. 3.  Soft tissue attenuating enhancing nodule within the tip of the tail of pancreas.  This is unchanged in size from 03/10/2012 and is favored to represent a benign splenule. Presence of benign splenule within the tail of pancreas can be confirmed with heat damage red blood cell nuclear medicine scan.   Original Report Authenticated By: Signa Kell, M.D.   Dg Chest Port 1 View  02/13/2013   *RADIOLOGY REPORT*  Clinical Data: Abdominal pain.  Nausea and vomiting.  Fever and chest pain.  PORTABLE CHEST - 1 VIEW  Comparison: 02/12/2013  Findings: Heart size and pulmonary vascularity are normal and the lungs are clear.  AICD in place.  No acute osseous abnormality.  IMPRESSION: No acute abnormality.   Original Report Authenticated By: Francene Boyers, M.D.   Dg Abd Portable 2v  02/13/2013   *RADIOLOGY REPORT*  Clinical Data: Abdominal pain.  PORTABLE ABDOMEN - 2 VIEW  Comparison: Scout image for CT scan dated 03/10/2012  Findings: The bowel gas pattern is normal.  No free air or free fluid.  No osseous abnormality.  IMPRESSION: Benign-appearing abdomen.   Original Report Authenticated By: Francene Boyers, M.D.              Blood pressure 123/57, pulse 93, temperature 99.3 F (37.4 C), temperature source Oral, resp. rate 20, height 5\' 9"  (1.753 m), weight 84.2 kg (185 lb 10 oz), SpO2 95.00%.  Physical exam:   General-- alert and oriented African-American male no distress Heart-- poorly heard heart sounds Lungs--clear posteriorly Abdomen-- excellent bowel sounds. None distended with mild tenderness left  lower quadrant regarding rigidity or rebound. There is no tenderness in the right  upper quadrant.   Assessment: 1. Abdominal pain/nausea and vomiting/fever. It is not entirely clear that this is G.I. in origin. Patient does have sludge in gallbladder and diabetics notoriously and have cholecystitis without severe pain. His exam is fairly non-remarkable other than mild left lower quadrant pain which he says is due to know bowel movement for several days. He has received a suppository. I think we need to determine if he has cholecystitis. 2. Chronic reflux. He is on Nexium chronically has had no signs of ongoing G.I. bleeding. Doubt that he has an ulcer. Agree with IV protonix for now 3. Diabetes currently poorly controlled 4. Cardiomyopathy, atrial fib, ICD. Patient chronically anticoagulant in  Plan: would go ahead and continue better coverage for possible cholecystitis with Levaquin and Flagyl. We'll go in order that the biliary scan to evaluate what you cholecystitis. Continue IV protonix for now. Who will follow with you.   Camaya Gannett JR,Denym Rahimi L 02/14/2013, 9:10 AM

## 2013-02-14 NOTE — Progress Notes (Signed)
Pt had a 6 beat run of SVT, no S/S pt was resting, called MD, pt already has Cmet ordered for this AM, will continue to monitor, Thanks, Lavonda Jumbo RN

## 2013-02-14 NOTE — Progress Notes (Addendum)
[facility]                                                                                Patient Demographics  [name] [name], is a 67 y.o. male, DOB - Jan 23, 1946, RUE:[id number], JYN:[id number]  Admit date - 02/12/2013  Admitting Physician [name], MD  Outpatient Primary MD for the patient is [name], MD  LOS - 2   Chief Complaint  Patient presents with  . Weakness        Assessment & Plan    1.Lethargy and Generalized weakness in a patient with early dementia- due to comminution of early bronchitis  + ? GI pathology, pending cultures, continue on empiric Levaquin will add flagyl, as he continues to have fevers and now has generalized abdominal pain more in the left quadrants with persistent vomiting despite unremarkable CT scan of his abdomen except for some biliary sludge, however no right upper quadrant tenderness or right upper quadrant pain, I will also place him on IV PPI drip after bolus, we'll make him n.p.o., will request GI to evaluate the patient.   2. Delirium in a dementia patient - he clearly has no headache, has supple neck with stable exam, CT brain stable, stable NH3, TSH, B12, ABG and RPR . Will have physical therapy evaluate him along with social worker for placement. Aspiration precautions and feeding assistance. Likely metabolic encephalopathy due to #1 above. Continue IV fluids for hydration and provide supportive care.   3. History of left bundle branch block, chronic systolic heart failure, atrial fibrillation. 6 beat run of asymptomatic SVT on 02/13/2013. Patient is status post pacemaker placement - no acute issues, goal will be rate controlled, continue his home dose Coreg, since he is n.p.o. we'll stop Coumadin and switch him to IV heparin drip per pharmacy, will defer long-term anticoagulation use to his primary cardiologist and primary care physician in the light of his dementia. He appears compensated from CHF standpoint. We'll get  a baseline echo gram. Will also add IV Lopressor if needed.   4. Hypertension continue home medications which include Coreg and ACE inhibitor and monitor. Will add when necessary IV hydralazine and Lopressor.   5. Diabetes mellitus type 2. Oral medications adjusted, since n.p.o. will change sliding scale to every 6 hours, low-dose Lantus has been added closely monitor sugars.   No results found for this basename: HGBA1C    CBG (last 3)   Recent Labs  02/13/13 1531 02/13/13 2211 02/14/13 0651  GLUCAP 187* 227* 279*     6.Mild hyponatremia. Clinically appears dehydrated, gentle IV fluids, hold diuretics, improving with fluids.     Code Status: Full  Family Communication: With wife in detail  Disposition Plan: To be decided   Procedures CT scan head, CT scan abdomen pelvis, echocardiogram, MRI brain could not be done due to pacemaker.   Consults  Physical therapy, GI    DVT Prophylaxis  Heparin/Coumadin  Lab Results  Component Value Date   PLT 98* 02/14/2013    Medications  Scheduled Meds: . aspirin  325 mg Oral Daily  . bisacodyl  10 mg Rectal BID  . carvedilol  12.5 mg Oral BID  .  carvedilol  18.75 mg Oral Q1400  . docusate sodium  100 mg Oral BID  . insulin aspart  0-9 Units Subcutaneous TID WC  . insulin aspart  5 Units Subcutaneous TID WC  . insulin detemir  22 Units Subcutaneous QHS  . levofloxacin (LEVAQUIN) IV  750 mg Intravenous Q24H  . linagliptin  5 mg Oral Daily  . metronidazole  500 mg Intravenous Q8H  . pantoprazole (PROTONIX) IV  80 mg Intravenous Once  . rosiglitazone  2 mg Oral Daily  . sodium chloride  3 mL Intravenous Q12H  . sodium chloride  3 mL Intravenous Q12H   Continuous Infusions: . pantoprozole (PROTONIX) infusion     PRN Meds:.sodium chloride, acetaminophen, ondansetron (ZOFRAN) IV, promethazine, sodium chloride  Antibiotics     Anti-infectives   Start     Dose/Rate Route Frequency Ordered Stop   02/14/13 0830   metroNIDAZOLE (FLAGYL) IVPB 500 mg     500 mg 100 mL/hr over 60 Minutes Intravenous Every 8 hours 02/14/13 0803     02/13/13 1100  levofloxacin (LEVAQUIN) IVPB 750 mg     750 mg 100 mL/hr over 90 Minutes Intravenous Every 24 hours 02/13/13 0954         Time Spent in minutes   35   Estelle Skibicki K M.D on 02/14/2013 at 8:10 AM  Between 7am to 7pm - Pager - [phone]  After 7pm go to [email or web address] - password [email or web address]  And look for the night coverage person covering for me after hours  [facility] Office  [phone]    Subjective:   [name] [name] today has, No headache, No chest pain, No abdominal pain - No Nausea, No new weakness tingling or numbness, Positive cough Cough - But no SOB.    Objective:   Filed Vitals:   02/13/13 2226 02/14/13 0008 02/14/13 0100 02/14/13 0616  BP: 111/49  133/66 123/57  Pulse: 104  85 93  Temp: 102.6 F (39.2 C) 100.2 F (37.9 C) 100 F (37.8 C) 99.3 F (37.4 C)  TempSrc: Oral Oral Oral Oral  Resp: 20  20 20   Height:      Weight:    84.2 kg (185 lb 10 oz)  SpO2: 93%  96% 95%    Wt Readings from Last 3 Encounters:  02/14/13 84.2 kg (185 lb 10 oz)  07/20/12 92.534 kg (204 lb)  06/19/11 93.35 kg (205 lb 12.8 oz)     Intake/Output Summary (Last 24 hours) at 02/14/13 0810 Last data filed at 02/14/13 0700  Gross per 24 hour  Intake 1726.33 ml  Output    775 ml  Net 951.33 ml    Exam Awake Alert, Oriented X 2, No new F.N deficits, Normal affect, Patient has negative Kernig's and Brudzinski sign. Neck is supple. [place].AT,PERRAL No JVD, No cervical lymphadenopathy appriciated.  Symmetrical Chest wall movement, Good air movement bilaterally, CTAB RRR,No Gallops,Rubs or new Murmurs, No Parasternal Heave +ve B.Sounds, Abd Soft, Non tender, No organomegaly appriciated, No rebound - guarding or rigidity. No Cyanosis, Clubbing or edema, No new Rash or bruise      Data Review   Micro Results No results found for this  or any previous visit (from the past 240 hour(s)).  Radiology Reports Dg Chest 2 View  02/12/2013   *RADIOLOGY REPORT*  Clinical Data: Weakness, fall, numbness in feet, headache, nausea, history diabetes, hypertension, nonischemic cardiomyopathy  CHEST - 2 VIEW  Comparison: 12/22/2003  Findings: Left subclavian pacemaker / AICD leads project at  right atrium, right ankle, and coronary sinus. Upper normal heart size. Normal mediastinal contours and pulmonary vascularity for AP technique. Lungs appear hyperinflated without infiltrate, pleural effusion or pneumothorax. Minimal peribronchial thickening. No acute osseous findings.  IMPRESSION: Post AICD. Bronchitic changes and hyperinflation without acute infiltrate.   Original Report Authenticated By: Ulyses Southward, M.D.   Ct Head Wo Contrast  02/12/2013   *RADIOLOGY REPORT*  Clinical Data: Frequent falls with unsteady gait and confusion.  CT HEAD WITHOUT CONTRAST  Technique:  Contiguous axial images were obtained from the base of the skull through the vertex without contrast.  Comparison: None.  Findings: Mild to moderate atrophy.  Chronic microvascular ischemic change affects the periventricular and subcortical white matter. No acute stroke or hemorrhage.  No mass lesion or hydrocephalus. No extra-axial fluid.  Small calcifications in the mid pons, likely dystrophic from a small vascular malformation or remote infarction. No evidence for pontine mass.  Advanced vascular calcification. Calvarium intact.  Clear sinuses and mastoids.  IMPRESSION: Mild to moderate atrophy and chronic microvascular ischemic change. No acute intracranial findings.   Original Report Authenticated By: Davonna Belling, M.D.    CBC  Recent Labs Lab 02/12/13 1739 02/12/13 1745 02/13/13 1556 02/14/13 0445  WBC 3.1*  --  4.0 3.6*  HGB 12.9* 14.3 11.1* 11.5*  HCT 38.2* 42.0 33.1* 34.8*  PLT 157  --  112* 98*  MCV 73.9*  --  74.0* 74.7*  MCH 25.0*  --  24.8* 24.7*  MCHC 33.8  --   33.5 33.0  RDW 15.7*  --  16.0* 15.9*  LYMPHSABS 0.1*  --   --   --   MONOABS 0.2  --   --   --   EOSABS 0.0  --   --   --   BASOSABS 0.0  --   --   --     Chemistries   Recent Labs Lab 02/12/13 1739 02/12/13 1745 02/13/13 1556 02/14/13 0445  NA 129* 132* 135 135  K 3.9 4.0 3.9 3.9  CL 94* 97 101 101  CO2 24  --  23 21  GLUCOSE 453* 459* 175* 291*  BUN 21 21 19 22   CREATININE 1.21 1.40* 1.31 1.28  CALCIUM 9.4  --  8.3* 8.5  AST 21  --  31 59*  ALT 25  --  26 44  ALKPHOS 101  --  66 74  BILITOT 0.2*  --  0.2* 0.3   ------------------------------------------------------------------------------------------------------------------ estimated creatinine clearance is 54.5 ml/min (by C-G formula based on Cr of 1.28). ------------------------------------------------------------------------------------------------------------------ No results found for this basename: HGBA1C,  in the last 72 hours ------------------------------------------------------------------------------------------------------------------ No results found for this basename: CHOL, HDL, LDLCALC, TRIG, CHOLHDL, LDLDIRECT,  in the last 72 hours ------------------------------------------------------------------------------------------------------------------  Recent Labs  02/13/13 0545  TSH 0.582   ------------------------------------------------------------------------------------------------------------------  Recent Labs  02/13/13 1050  VITAMINB12 838    Coagulation profile  Recent Labs Lab 02/12/13 1739 02/13/13 0545 02/14/13 0445  INR 2.25* 2.18* 1.49    No results found for this basename: DDIMER,  in the last 72 hours  Cardiac Enzymes  Recent Labs Lab 02/12/13 1739  TROPONINI <0.30   ------------------------------------------------------------------------------------------------------------------ No components found with this basename: POCBNP,

## 2013-02-14 NOTE — Progress Notes (Signed)
Pharmacy asked that wife be asked to look for home med, Avandia 2 mg, since unable to order from [facility] pharmacy.  Wife unable to find Avandia 2 mg @ home.  Pharmacy informed.

## 2013-02-14 NOTE — Care Management Note (Addendum)
    Page 1 of 2   02/14/2013     4:52:13 PM   CARE MANAGEMENT NOTE 02/14/2013  Patient:  [name] [name], [name] [name]   Account Number:  [id number]  Date Initiated:  02/14/2013  Documentation initiated by:  [facility]  Subjective/Objective Assessment:   67 y.o. male with hx of DM, dilated CMP, HTN, afib on Coumadin, acute on chronic diastolic CHF, s/p ICD, dementia with progressive decline over the past year, frequent falls at home, brought to the ER with  generalized weakness, and fall.     Action/Plan:   Panculture/ home with HHPT   Anticipated DC Date:  02/16/2013   Anticipated DC Plan:  HOME W HOME HEALTH SERVICES      DC Planning Services  CM consult      [facility]  HOME HEALTH  DURABLE MEDICAL EQUIPMENT   Choice offered to / List presented to:  C-3 Spouse   DME arranged  Dan Humphreys      DME agency  [facility]     HH arranged  HH-2 PT      [facility] agency  [facility]   Status of service:  Completed, signed off Medicare Important Message given?   (If response is "NO", the following Medicare IM given date fields will be blank) Date Medicare IM given:   Date Additional Medicare IM given:    Discharge Disposition:    Per UR Regulation:    If discussed at Long Length of Stay Meetings, dates discussed:    Comments:  02/14/13 @ 1645.Marland KitchenMarland KitchenOletta Cohn, RN, BSN, NCM Darlin Drop of [facility] states they can not take pt.  Corrie Dandy of [place] notified and will follow up with pt closer to D/C home due to availability of PT at this time.  CM will follow up.  02/14/13 @ 1045.Marland KitchenMarland KitchenOletta Cohn, RN, BSN, [employer] [phone] Spoke with pt and wife at bedside regarding discharge planning for Physical Therapy.  Pt very sleepy, but gave wife permission to choose [facility] agency.  Offered wife HH agency list- she chose [facility] to render services.  Darlin Drop of [facility] notified.  Rolling walker ordered for pt upon discharge home as suggested by PT.

## 2013-02-14 NOTE — Progress Notes (Signed)
PT Cancellation Note  Patient Details Name: JEMARCUS DOUGAL MRN: [id number] DOB: 23-Aug-1946   Cancelled Treatment:     PT session held at this time due to RN's request due to pt with stomach pain.  Will attempt back later today if time allows.      Verdell Face, [place] [phone] 02/14/2013

## 2013-02-15 DIAGNOSIS — Z95810 Presence of automatic (implantable) cardiac defibrillator: Secondary | ICD-10-CM

## 2013-02-15 DIAGNOSIS — I5022 Chronic systolic (congestive) heart failure: Secondary | ICD-10-CM

## 2013-02-15 DIAGNOSIS — I059 Rheumatic mitral valve disease, unspecified: Secondary | ICD-10-CM

## 2013-02-15 DIAGNOSIS — R51 Headache: Secondary | ICD-10-CM

## 2013-02-15 LAB — PROTIME-INR: Prothrombin Time: 21.1 seconds — ABNORMAL HIGH (ref 11.6–15.2)

## 2013-02-15 LAB — DIFFERENTIAL
Basophils Absolute: 0 10*3/uL (ref 0.0–0.1)
Lymphocytes Relative: 6 % — ABNORMAL LOW (ref 12–46)
Neutro Abs: 1.6 10*3/uL — ABNORMAL LOW (ref 1.7–7.7)
Neutrophils Relative %: 89 % — ABNORMAL HIGH (ref 43–77)

## 2013-02-15 LAB — BASIC METABOLIC PANEL
CO2: 22 mEq/L (ref 19–32)
Calcium: 7.9 mg/dL — ABNORMAL LOW (ref 8.4–10.5)
GFR calc Af Amer: 60 mL/min — ABNORMAL LOW (ref >90)
GFR calc non Af Amer: 52 mL/min — ABNORMAL LOW (ref >90)
Sodium: 142 mEq/L (ref 135–145)

## 2013-02-15 LAB — CBC
MCH: 25.1 pg — ABNORMAL LOW (ref 26.0–34.0)
Platelets: 59 10*3/uL — ABNORMAL LOW (ref 150–400)
RBC: 4.71 MIL/uL (ref 4.22–5.81)
WBC: 1.7 10*3/uL — ABNORMAL LOW (ref 4.0–10.5)

## 2013-02-15 LAB — GLUCOSE, CAPILLARY
Glucose-Capillary: 179 mg/dL — ABNORMAL HIGH (ref 70–99)
Glucose-Capillary: 193 mg/dL — ABNORMAL HIGH (ref 70–99)
Glucose-Capillary: 232 mg/dL — ABNORMAL HIGH (ref 70–99)

## 2013-02-15 LAB — HEPARIN LEVEL (UNFRACTIONATED): Heparin Unfractionated: 0.87 IU/mL — ABNORMAL HIGH (ref 0.30–0.70)

## 2013-02-15 MED ORDER — IBUPROFEN 400 MG PO TABS
400.0000 mg | ORAL_TABLET | Freq: Four times a day (QID) | ORAL | Status: DC | PRN
  Administered 2013-02-15: 400 mg via ORAL
  Filled 2013-02-15 (×2): qty 1

## 2013-02-15 MED ORDER — VITAMIN K1 1 MG/0.5ML IJ SOLN
1.0000 mg | Freq: Once | INTRAMUSCULAR | Status: DC
  Filled 2013-02-15: qty 0.5

## 2013-02-15 MED ORDER — HEPARIN (PORCINE) IN NACL 100-0.45 UNIT/ML-% IJ SOLN
900.0000 [IU]/h | INTRAMUSCULAR | Status: DC
  Administered 2013-02-15 (×2): 1000 [IU]/h via INTRAVENOUS
  Filled 2013-02-15: qty 250

## 2013-02-15 MED ORDER — DOXYCYCLINE HYCLATE 100 MG IV SOLR
100.0000 mg | Freq: Two times a day (BID) | INTRAVENOUS | Status: DC
  Administered 2013-02-15 – 2013-02-17 (×6): 100 mg via INTRAVENOUS
  Filled 2013-02-15 (×8): qty 100

## 2013-02-15 MED ORDER — VITAMIN K1 1 MG/0.5ML IJ SOLN
2.0000 mg | Freq: Once | INTRAMUSCULAR | Status: AC
  Administered 2013-02-15: 2 mg via INTRAMUSCULAR
  Filled 2013-02-15: qty 1

## 2013-02-15 MED ORDER — SODIUM CHLORIDE 0.9 % IV SOLN
250.0000 mL | INTRAVENOUS | Status: AC | PRN
  Administered 2013-02-15: 250 mL via INTRAVENOUS

## 2013-02-15 NOTE — Progress Notes (Signed)
ANTICOAGULATION CONSULT NOTE - Follow Up Consult  Pharmacy Consult for heparin Indication: atrial fibrillation  Labs:  Recent Labs  02/12/13 1739 02/12/13 1745 02/13/13 0545 02/13/13 1556 02/14/13 0445 02/14/13 1711 02/14/13 2330  HGB 12.9* 14.3  --  11.1* 11.5*  --   --   HCT 38.2* 42.0  --  33.1* 34.8*  --   --   PLT 157  --   --  112* 98*  --   --   APTT 57*  --   --   --   --   --   --   LABPROT 23.9*  --  23.3*  --  17.6*  --   --   INR 2.25*  --  2.18*  --  1.49  --   --   HEPARINUNFRC  --   --   --   --   --  0.46 1.14*  CREATININE 1.21 1.40*  --  1.31 1.28  --   --   TROPONINI <0.30  --   --   --   --   --   --     Assessment: 67yo male now supratherapeutic on heparin after one level at goal.  Goal of Therapy:  Heparin level 0.3-0.7 units/ml   Plan:  Will hold gtt x39min then resume at lower rate of 1000 units/hr and check level in 6hr.  Vernard Gambles, PharmD, BCPS  02/15/2013,12:23 AM

## 2013-02-15 NOTE — Consult Note (Signed)
[facility]  Total days of antibiotics 4        Day 1 doxycycline        Day 2 metronidazole        (2 days of levo, d/c'd on 6/24)       Reason for Consult FUO, concern for tick borne illness    Referring Physician: Thedore Mins  Principal Problem:   Weakness generalized Active Problems:   AODM   CARDIOMYOPATHY, PRIMARY, DILATED   LBBB   SYSTOLIC HEART FAILURE, CHRONIC   HTN (hypertension)   Chronic anticoagulation   Atrial fibrillation   Biventricular implantable cardioverter-defibrillator in situ   Fever of undetermined origin    HPI: [name] [name] is a 67 y.o. male with HTN, atrial fibrillation on coumadin, NICM s/p Bi-V ICD. He presented on 06/21 with complaint of confusion and frequent falls that have progressed over 1 month time period, which is not his baseline, where he is normally very active. He had a few episodes of falls during this period associated with light headedness. He denies any loss of consciousness, vertigo, tinnitus, hearing loss, stange smell, seizure or palpitations. On day of admit, he sustained a ground level fall but also complained of excessive uriationa nad abdominal pain. In the ED he was found to have fever of 102, hyperglycemia with BS 437, hyponatremia at 120. He reportedly have vomiting on the day of admit and had little intact. His hyponatremia was thought to be due to hypovolemia hyponatremia and lower due to hyperglycemia.   For the last 2 weeks his wife also noticed progressively increasing confusion. He also had an off and on headache for the last few weeks, with no alleviating and aggravating factors and no neck stiffness or back pain. She also noted him having a raised lesion on his back from an insect bite, unclear if tick bit vs. Mosquito bite.    His CT scan head, UA and CXR were negative on admit. Since he started to complain of having LLL pain, Nausea in addition to fevers, he was started on levofloxacin and  metronidazole for concern for enteritis/diverticulitis. He underwent abd/pelvic CT which was normal as well as hida scan which was unrevealing. Over the next 4 days of this hospitlaization he still remains to have high fevers, started to have worsening leukopenia with WBC of 1.7 down from 3.1 and ANC of 1.6 and TLC of 0.1. Plt now down to 59 from 150. Given that he had remote history of being outdoors with surrounding erythema to insect bite, he was started on doxycycline for concern of RMSF.   Past Medical History  Diagnosis Date  . Diabetes mellitus   . GERD (gastroesophageal reflux disease)   . Obesity   . Cardiomyopathy, nonischemic   . Chronic anticoagulation   . Hypertension   . ICD (implantable cardiac defibrillator) in place     has BiV in place.   . Acute on chronic diastolic heart failure     Allergies: No Known Allergies  MEDICATIONS: . bisacodyl  10 mg Rectal BID  . docusate sodium  100 mg Oral BID  . doxycycline (VIBRAMYCIN) IV  100 mg Intravenous Q12H  . insulin aspart  0-9 Units Subcutaneous Q6H  . insulin detemir  22 Units Subcutaneous QHS  . metronidazole  500 mg Intravenous Q8H  . sodium chloride  3 mL Intravenous Q12H    History  Substance Use Topics  . Smoking status: Never Smoker   . Smokeless  tobacco: Not on file  . Alcohol Use: No    Family History  Problem Relation Age of Onset  . Heart disease Mother   . Hypertension Mother   . Diabetes Mother   . Diabetes Father     ROS: Altered mental status unable to do ROS  OBJECTIVE: Temp:  [99.5 F (37.5 C)-103.1 F (39.5 C)] 100 F (37.8 C) (06/24 2046) Pulse Rate:  [84-94] 92 (06/24 2046) Resp:  [18-20] 18 (06/24 2046) BP: (110-128)/(50-67) 128/64 mmHg (06/24 2046) SpO2:  [91 %-97 %] 97 % (06/24 2046) Weight:  [182 lb 1.6 oz (82.6 kg)] 182 lb 1.6 oz (82.6 kg) (06/24 0600)  GEN: Pleasant patient lying in the stretcher in no acute distress; cooperative with exam.  PSYCH: alert and oriented to  self; does not appear anxious or depressed; affect is appropriate.  HEENT: Mucous membranes pink and anicteric; PERRLA; EOM intact; no cervical lymphadenopathy nor thyromegaly or carotid bruit; no JVD; There were no stridor. Neck is very supple.  CHEST WALL: No tenderness  CHEST: Normal respiration, clear to auscultation bilaterally.  HEART: Regular rate and rhythm. There are no murmur, rub, or gallops.  BACK: No kyphosis or scoliosis; no CVA tenderness  ABDOMEN: soft and non-tender; no masses, no organomegaly, normal abdominal bowel sounds; no pannus; no intertriginous candida. There is no rebound and no distention.  EXTREMITIES: No bone or joint deformity; age-appropriate arthropathy of the hands and knees; no edema; no ulcerations. There is no calf tenderness.  PULSES: 2+ and symmetric  SKIN: Normal hydration no rash or ulceration  CNS: Cranial nerves 2-12 grossly intact no focal lateralizing neurologic deficit.   LABS: Results for orders placed during the hospital encounter of 02/12/13 (from the past 48 hour(s))  PROTIME-INR     Status: Abnormal   Collection Time    02/14/13  4:45 AM      Result Value Range   Prothrombin Time 17.6 (*) 11.6 - 15.2 seconds   INR 1.49  0.00 - 1.49  CBC     Status: Abnormal   Collection Time    02/14/13  4:45 AM      Result Value Range   WBC 3.6 (*) 4.0 - 10.5 K/uL   RBC 4.66  4.22 - 5.81 MIL/uL   Hemoglobin 11.5 (*) 13.0 - 17.0 g/dL   HCT 16.1 (*) 09.6 - 04.5 %   MCV 74.7 (*) 78.0 - 100.0 fL   MCH 24.7 (*) 26.0 - 34.0 pg   MCHC 33.0  30.0 - 36.0 g/dL   RDW 40.9 (*) 81.1 - 91.4 %   Platelets 98 (*) 150 - 400 K/uL   Comment: CONSISTENT WITH PREVIOUS RESULT  COMPREHENSIVE METABOLIC PANEL     Status: Abnormal   Collection Time    02/14/13  4:45 AM      Result Value Range   Sodium 135  135 - 145 mEq/L   Potassium 3.9  3.5 - 5.1 mEq/L   Chloride 101  96 - 112 mEq/L   CO2 21  19 - 32 mEq/L   Glucose, Bld 291 (*) 70 - 99 mg/dL   BUN 22  6 - 23  mg/dL   Creatinine, Ser 7.82  0.50 - 1.35 mg/dL   Calcium 8.5  8.4 - 95.6 mg/dL   Total Protein 6.5  6.0 - 8.3 g/dL   Albumin 3.0 (*) 3.5 - 5.2 g/dL   AST 59 (*) 0 - 37 U/L   ALT 44  0 - 53 U/L  Alkaline Phosphatase 74  39 - 117 U/L   Total Bilirubin 0.3  0.3 - 1.2 mg/dL   GFR calc non Af Amer 55 (*) >90 mL/min   GFR calc Af Amer 64 (*) >90 mL/min   Comment:            The eGFR has been calculated     using the CKD EPI equation.     This calculation has not been     validated in all clinical     situations.     eGFR's persistently     <90 mL/min signify     possible Chronic Kidney Disease.  GLUCOSE, CAPILLARY     Status: Abnormal   Collection Time    02/14/13  6:51 AM      Result Value Range   Glucose-Capillary 279 (*) 70 - 99 mg/dL   Comment 1 Notify RN     Comment 2 Documented in Chart    GLUCOSE, CAPILLARY     Status: Abnormal   Collection Time    02/14/13 11:42 AM      Result Value Range   Glucose-Capillary 250 (*) 70 - 99 mg/dL   Comment 1 Notify RN    HEPARIN LEVEL (UNFRACTIONATED)     Status: None   Collection Time    02/14/13  5:11 PM      Result Value Range   Heparin Unfractionated 0.46  0.30 - 0.70 IU/mL   Comment:            IF HEPARIN RESULTS ARE BELOW     EXPECTED VALUES, AND PATIENT     DOSAGE HAS BEEN CONFIRMED,     SUGGEST FOLLOW UP TESTING     OF ANTITHROMBIN III LEVELS.  GLUCOSE, CAPILLARY     Status: Abnormal   Collection Time    02/14/13  6:04 PM      Result Value Range   Glucose-Capillary 186 (*) 70 - 99 mg/dL  HEPARIN LEVEL (UNFRACTIONATED)     Status: Abnormal   Collection Time    02/14/13 11:30 PM      Result Value Range   Heparin Unfractionated 1.14 (*) 0.30 - 0.70 IU/mL   Comment:            IF HEPARIN RESULTS ARE BELOW     EXPECTED VALUES, AND PATIENT     DOSAGE HAS BEEN CONFIRMED,     SUGGEST FOLLOW UP TESTING     OF ANTITHROMBIN III LEVELS.  GLUCOSE, CAPILLARY     Status: Abnormal   Collection Time    02/14/13 11:43 PM       Result Value Range   Glucose-Capillary 219 (*) 70 - 99 mg/dL  GLUCOSE, CAPILLARY     Status: Abnormal   Collection Time    02/15/13  6:37 AM      Result Value Range   Glucose-Capillary 179 (*) 70 - 99 mg/dL  HEPARIN LEVEL (UNFRACTIONATED)     Status: Abnormal   Collection Time    02/15/13  8:05 AM      Result Value Range   Heparin Unfractionated 0.87 (*) 0.30 - 0.70 IU/mL   Comment:            IF HEPARIN RESULTS ARE BELOW     EXPECTED VALUES, AND PATIENT     DOSAGE HAS BEEN CONFIRMED,     SUGGEST FOLLOW UP TESTING     OF ANTITHROMBIN III LEVELS.  PROTIME-INR     Status: Abnormal   Collection Time  02/15/13  8:05 AM      Result Value Range   Prothrombin Time 21.1 (*) 11.6 - 15.2 seconds   INR 1.90 (*) 0.00 - 1.49  CBC     Status: Abnormal   Collection Time    02/15/13  8:05 AM      Result Value Range   WBC 1.7 (*) 4.0 - 10.5 K/uL   RBC 4.71  4.22 - 5.81 MIL/uL   Hemoglobin 11.8 (*) 13.0 - 17.0 g/dL   HCT 16.1 (*) 09.6 - 04.5 %   MCV 74.1 (*) 78.0 - 100.0 fL   MCH 25.1 (*) 26.0 - 34.0 pg   MCHC 33.8  30.0 - 36.0 g/dL   RDW 40.9 (*) 81.1 - 91.4 %   Platelets 59 (*) 150 - 400 K/uL   Comment: CONSISTENT WITH PREVIOUS RESULT  BASIC METABOLIC PANEL     Status: Abnormal   Collection Time    02/15/13  8:05 AM      Result Value Range   Sodium 142  135 - 145 mEq/L   Potassium 4.0  3.5 - 5.1 mEq/L   Chloride 110  96 - 112 mEq/L   CO2 22  19 - 32 mEq/L   Glucose, Bld 207 (*) 70 - 99 mg/dL   BUN 22  6 - 23 mg/dL   Creatinine, Ser 7.82  0.50 - 1.35 mg/dL   Calcium 7.9 (*) 8.4 - 10.5 mg/dL   GFR calc non Af Amer 52 (*) >90 mL/min   GFR calc Af Amer 60 (*) >90 mL/min   Comment:            The eGFR has been calculated     using the CKD EPI equation.     This calculation has not been     validated in all clinical     situations.     eGFR's persistently     <90 mL/min signify     possible Chronic Kidney Disease.  DIFFERENTIAL     Status: Abnormal   Collection Time     02/15/13  8:05 AM      Result Value Range   Neutrophils Relative % 89 (*) 43 - 77 %   Neutro Abs 1.6 (*) 1.7 - 7.7 K/uL   Lymphocytes Relative 6 (*) 12 - 46 %   Lymphs Abs 0.1 (*) 0.7 - 4.0 K/uL   Monocytes Relative 3  3 - 12 %   Monocytes Absolute 0.1  0.1 - 1.0 K/uL   Eosinophils Relative 2  0 - 5 %   Eosinophils Absolute 0.0  0.0 - 0.7 K/uL   Basophils Relative 0  0 - 1 %   Basophils Absolute 0.0  0.0 - 0.1 K/uL  GLUCOSE, CAPILLARY     Status: Abnormal   Collection Time    02/15/13 11:31 AM      Result Value Range   Glucose-Capillary 193 (*) 70 - 99 mg/dL   Comment 1 Notify RN    GLUCOSE, CAPILLARY     Status: Abnormal   Collection Time    02/15/13  4:53 PM      Result Value Range   Glucose-Capillary 232 (*) 70 - 99 mg/dL   Comment 1 Notify RN      MICRO: 6/24 blood cx x 2 NGTD 6/22 blood cx x 2 NGTD 6/21 urine cx NGTD  IMAGING: Nm Hepatobiliary  02/14/2013   *RADIOLOGY REPORT*  Clinical Data:  Question cholecystitis.  Abdominal pain.  NUCLEAR MEDICINE  HEPATOBILIARY IMAGING  Technique:  Sequential images of the abdomen were obtained out to 60 minutes following intravenous administration of radiopharmaceutical.  Radiopharmaceutical:  5.53mCi Tc-12m Choletec  Comparison:  CT 10/16/2012  Findings: There is homogeneous accumulation radiotracer within the liver and rapid clears the blood pool.  The gallbladder begins to fill at 20 minutes continues to fill 45 inches.  Counts are evident within the duodenum by 45 minutes.  IMPRESSION: Patent cystic duct and common bile duct.  No evidence of cholecystitis.   Original Report Authenticated By: Genevive Bi, M.D.    Assessment/Plan:  67yo M with DM, NICM presents with FUO, weakness and confusion. Concern for developing leukopenia, thrombocytopenia with high fevers x 4 days, etiology for presentation is presently unknown although outdoor insect bite RF is concerning for tickborne illness - agree with iv doxycycline 100mg  IV Q12hr -  please check RMSF, erhlichia, lyme disease - recommend to get LP to send CSF for cell count, gram stain, culture, HSV this would likely need to be coordinated in holding his coumadin, as well as possibly needing plt tsf to increase platelets - if encephalopathy worsens in setting of fever, will recommend to start antibiotics for meningoencephalitis, and recommend to get MRI of brain to see    More recs to follow  Cadyn Fann B. Drue Second MD MPH [facility] [phone]

## 2013-02-15 NOTE — Consult Note (Signed)
Date: 02/15/2013               Patient Name:  [name] [name] MRN: [id number]  DOB: 1945/10/25 Age / Sex: 67 y.o., male   PCP: [name], MD         Requesting Physician: Dr. [name], MD    Consulting Reason:  Fever of unknown origin     Chief Complaint: confusion and frequent falls  History of Present Illness:  [name] [name] is a 67 yo hypertensive and diabetic male with dilated cardiomyopathy and atrial fibrillation on coumadin. He presented on 06/21 with complaint of confusion and frequent falls. He was in his usual state of health and perhaps very active 1 month ago when he started feeling fatigued and light headed. He had a few episodes of falls during this period associated with light headedness. He denies any loss of consciousness, vertigo, tinnitus, hearing loss, stange smell, seizure or palpitations.   For the last 2 weeks his wife also noticed progressively increasing confusion. He also had an off and on headache for the last few weeks, with no alleviating and aggravating factors and no neck stiffness or back pain. He had a chronic non productive cough for the last 2-3 months. He reports a diarrheal illness just before the whole sequence of events started. He also reports a bug bit at his back 1 month ago as he stayed most of the time in the woods, he did not see the bug but his wife noticed a large 1 inch pink, itching, raised spot on his back. On 06/21 he fell in the kitchen and his wife brought him to the hospital. He also had excessive urination and abdominal pain for some days. His temperature was 102F in the ED though he never noticed any fever before. He had hyperglycemia (437), hyponatremia (129) and leukopenia (3.1) at the time of presentation. His CT scan head, UA and CXR were negative. He also has vomiting from the day of admission too and has ben eating very poorly.  (+) vomiting, loss of appetite, night sweats, fatigue, generalized weakness, cough, insect  bite, outdoor exposure, (- ) SOB, chest pain, focal weakness  Meds: Current Facility-Administered Medications  Medication Dose Route Frequency Provider Last Rate Last Dose  . acetaminophen (TYLENOL) tablet 650 mg  650 mg Oral Q6H PRN Loren Racer, MD   650 mg at 02/15/13 0644  . bisacodyl (DULCOLAX) suppository 10 mg  10 mg Rectal BID [name], MD   10 mg at 02/14/13 2307  . docusate sodium (COLACE) capsule 100 mg  100 mg Oral BID [name], MD   100 mg at 02/15/13 1040  . doxycycline (VIBRAMYCIN) 100 mg in dextrose 5 % 250 mL IVPB  100 mg Intravenous Q12H [name], MD   100 mg at 02/15/13 1123  . hydrALAZINE (APRESOLINE) injection 10 mg  10 mg Intravenous Q6H PRN [name], MD      . insulin aspart (novoLOG) injection 0-9 Units  0-9 Units Subcutaneous Q6H [name], MD   3 Units at 02/15/13 1720  . insulin detemir (LEVEMIR) injection 22 Units  22 Units Subcutaneous QHS [name], MD   22 Units at 02/14/13 2308  . metoprolol (LOPRESSOR) injection 5 mg  5 mg Intravenous Q4H PRN [name], MD      . metroNIDAZOLE (FLAGYL) IVPB 500 mg  500 mg Intravenous Q8H [name], MD   500 mg at 02/15/13 1720  .  morphine 2 MG/ML injection 2 mg  2 mg Intravenous Q4H PRN [name], MD   2 mg at 02/14/13 0958  . ondansetron (ZOFRAN) injection 4 mg  4 mg Intravenous Q6H PRN [name], MD   4 mg at 02/13/13 2232  . pantoprazole (PROTONIX) 80 mg in sodium chloride 0.9 % 250 mL infusion  8 mg/hr Intravenous Continuous [name], MD 25 mL/hr at 02/14/13 2317 8 mg/hr at 02/14/13 2317  . promethazine (PHENERGAN) injection 25 mg  25 mg Intravenous Q8H PRN [name], MD   25 mg at 02/14/13 0410  . sodium chloride 0.9 % injection 3 mL  3 mL Intravenous Q12H [name], MD      . sodium chloride 0.9 % injection 3 mL  3 mL Intravenous PRN [name], MD   3 mL at 02/15/13 0945    Allergies: Allergies as of 02/12/2013  . (No Known Allergies)   Past Medical  History  Diagnosis Date  . Diabetes mellitus   . GERD (gastroesophageal reflux disease)   . Obesity   . Cardiomyopathy, nonischemic   . Chronic anticoagulation   . Hypertension   . ICD (implantable cardiac defibrillator) in place     has BiV in place.   . Acute on chronic diastolic heart failure    Past Surgical History  Procedure Laterality Date  . Cardiac catheterization  10/01/2001    THERE WAS GLOBAL HYPOKINESIS AND EF 25%. THERE APPEARED TO BE GLOBAL DECREASE IN WALL MOTION  . Lithotripsy  2001  . Cervical spine surgery  1994  . Cardiac defibrillator placement  06/2009    WITH GENERATOR REPLACED; BiV ICD  . US echocardiography  03/21/2008    EF 30-35%  . Cardiovascular stress test  03/20/2009    EF 33%   Family History  Problem Relation Age of Onset  . Heart disease Mother   . Hypertension Mother   . Diabetes Mother   . Diabetes Father    History   Social History  . Marital Status: Married    Spouse Name: N/A    Number of Children: N/A  . Years of Education: N/A   Occupational History  . Not on file.   Social History Main Topics  . Smoking status: Never Smoker   . Smokeless tobacco: Not on file  . Alcohol Use: No  . Drug Use: No  . Sexually Active:    Other Topics Concern  . Not on file   Social History Narrative  . No narrative on file    Physical Exam: Blood pressure 124/60, pulse 94, temperature 101.4 F (38.6 C), temperature source Oral, resp. rate 20, height 5\' 9"  (1.753 m), weight 82.6 kg (182 lb 1.6 oz), SpO2 95.00%. General: Patient was somnolent and sleeping during the interview and examination..  Head: Normocephalic and atraumatic Ear: TM normal bilaterally Nose: No erythema or drainage noted.  Turbinates normal Mouth: no erythema or exudates, dry tongue and mouth Eyes: PERRL, EOMI, conjunctivae normal, No scleral icterus.  Neck: Supple with no stiffness, normal ROM, No JVD, mass, thyromegaly, or carotid bruit present.  Cardiovascular:  RRR, S1 normal, S2 normal, no MRG, pulses symmetric and intact bilaterally Pulmonary/Chest: normal respiratory effort, CTAB, no wheezes, rales, or rhonchi Abdominal: Soft. tenderness in the right upper quadrant more pronounced during deep palpation, murphy sign negative, Neurological: CN II-XII intact, 3-4/5 strength in upper limbs, 5/5 in lower limbs, no focal motor deficit, sensory intact to light touch bilaterally.  Lab results: Basic Metabolic Panel:  Recent Labs  78/29/56 0445 02/15/13 0805  NA 135 142  K 3.9 4.0  CL 101 110  CO2 21 22  GLUCOSE 291* 207*  BUN 22 22  CREATININE 1.28 1.35  CALCIUM 8.5 7.9*   Liver Function Tests:  Recent Labs  02/13/13 1556 02/14/13 0445  AST 31 59*  ALT 26 44  ALKPHOS 66 74  BILITOT 0.2* 0.3  PROT 6.2 6.5  ALBUMIN 2.7* 3.0*    Recent Labs  02/13/13 1556  LIPASE 16    Recent Labs  02/13/13 1050  AMMONIA 17   CBC:  Recent Labs  02/12/13 1739  02/14/13 0445 02/15/13 0805  WBC 3.1*  < > 3.6* 1.7*  NEUTROABS 2.7  --   --   --   HGB 12.9*  < > 11.5* 11.8*  HCT 38.2*  < > 34.8* 34.9*  MCV 73.9*  < > 74.7* 74.1*  PLT 157  < > 98* 59*  < > = values in this interval not displayed. Cardiac Enzymes:  Recent Labs  02/12/13 1739  TROPONINI <0.30   BNP: No results found for this basename: PROBNP,  in the last 72 hours D-Dimer: No results found for this basename: DDIMER,  in the last 72 hours CBG:  Recent Labs  02/14/13 1142 02/14/13 1804 02/14/13 2343 02/15/13 0637 02/15/13 1131 02/15/13 1653  GLUCAP 250* 186* 219* 179* 193* 232*   Hemoglobin A1C: No results found for this basename: HGBA1C,  in the last 72 hours Fasting Lipid Panel: No results found for this basename: CHOL, HDL, LDLCALC, TRIG, CHOLHDL, LDLDIRECT,  in the last 72 hours Thyroid Function Tests:  Recent Labs  02/13/13 0545  TSH 0.582   Anemia Panel:  Recent Labs  02/13/13 1050  VITAMINB12 838   Coagulation:  Recent Labs   02/14/13 0445 02/15/13 0805  LABPROT 17.6* 21.1*  INR 1.49 1.90*   Urine Drug Screen: Drugs of Abuse  No results found for this basename: labopia, cocainscrnur, labbenz, amphetmu, thcu, labbarb    Alcohol Level: No results found for this basename: ETH,  in the last 72 hours Urinalysis:  Recent Labs  02/12/13 2138 02/13/13 0953  COLORURINE YELLOW YELLOW  LABSPEC 1.024 1.021  PHURINE 5.5 5.5  GLUCOSEU >1000* >1000*  HGBUR NEGATIVE NEGATIVE  BILIRUBINUR NEGATIVE NEGATIVE  KETONESUR NEGATIVE NEGATIVE  PROTEINUR NEGATIVE NEGATIVE  UROBILINOGEN 0.2 1.0  NITRITE NEGATIVE NEGATIVE  LEUKOCYTESUR NEGATIVE NEGATIVE   Misc. Labs:   Imaging results:  Nm Hepatobiliary  02/14/2013   *RADIOLOGY REPORT*  Clinical Data:  Question cholecystitis.  Abdominal pain.  NUCLEAR MEDICINE HEPATOBILIARY IMAGING  Technique:  Sequential images of the abdomen were obtained out to 60 minutes following intravenous administration of radiopharmaceutical.  Radiopharmaceutical:  5.82mCi Tc-21m Choletec  Comparison:  CT 10/16/2012  Findings: There is homogeneous accumulation radiotracer within the liver and rapid clears the blood pool.  The gallbladder begins to fill at 20 minutes continues to fill 45 inches.  Counts are evident within the duodenum by 45 minutes.  IMPRESSION: Patent cystic duct and common bile duct.  No evidence of cholecystitis.   Original Report Authenticated By: Genevive Bi, M.D.   Ct Abdomen Pelvis W Contrast  02/13/2013   *RADIOLOGY REPORT*  Clinical Data: Fever, nausea, and vomiting  CT ABDOMEN AND PELVIS WITH CONTRAST  Technique:  Multidetector CT imaging of the abdomen and pelvis was performed following the standard protocol during bolus administration of intravenous contrast.  Contrast: OMNIPAQUE IOHEXOL  300 MG/ML  SOLN  Comparison: 03/10/2012  Findings: There is atelectasis noted in both lung bases.  No pericardial or pleural effusion.  No focal liver abnormalities identified.   Sludge versus tiny stones noted within the dependent portion of the gallbladder.  The pancreas is unremarkable.  There is a enhancing nodule in the distal tip of tail of pancreas this measures 1.3 cm and is unchanged from previous exam.  There are several small splenules adjacent to the inferior aspect of the spleen.  The adrenal glands both appear normal.  The right kidney is normal. The left kidney is normal.  No obstructive uropathy noted.  Urinary bladder is unremarkable.  Abdominal aorta appears normal in caliber.  No aneurysm.  There is mild calcified atherosclerotic disease identified.  No upper abdominal adenopathy identified.  There is no pelvic or inguinal adenopathy.  The stomach appears normal.  The small bowel loops are within normal limits and caliber.  No evidence for obstruction.  The colon appears normal.  There is no free fluid or abnormal fluid collections identified within the abdomen or pelvis.  There is a periumbilical hernia which contains fat only.  Review of the visualized osseous structures is significant for mild lumbar degenerative disc disease.  IMPRESSION:  1.  No acute findings identified within the abdomen or pelvis. 2.  Sludge versus tiny stones within the dependent portion of the gallbladder. 3.  Soft tissue attenuating enhancing nodule within the tip of the tail of pancreas.  This is unchanged in size from 03/10/2012 and is favored to represent a benign splenule. Presence of benign splenule within the tail of pancreas can be confirmed with heat damage red blood cell nuclear medicine scan.   Original Report Authenticated By: Signa Kell, M.D.    Other results: EKG: normal EKG, normal sinus rhythm, unchanged from previous tracings.   Principal Problem:   Weakness generalized Active Problems:   AODM   CARDIOMYOPATHY, PRIMARY, DILATED   LBBB   SYSTOLIC HEART FAILURE, CHRONIC   HTN (hypertension)   Chronic anticoagulation   Atrial fibrillation   Biventricular  implantable cardioverter-defibrillator in situ   Fever of undetermined origin  Assessment, Plan, & Recommendations by Problem: 67 yo male presented with increasing confusion and frequent falls. He was reported to have a fever of 102 in ED and has been febrile since admission on 06/21.  RMSF: He has severe leukopenia with WBC count of 1.7, thrombocytopenia with platelets 59, history of bug bite and staying in woods, progressive fatigue and confusion, fever. -Doxycycline has already been started. We will wait for the results. Encephalitis: His fever, headaches, confusion and vomiting in hospital also suggests encephalitis. -We recommend LP after considering his INR and platelets level. -We recommend MRI to look for any changes in the brain parenchyma.   Signed: Virgina Evener, Med Student 02/15/2013, 5:21 PM

## 2013-02-15 NOTE — Progress Notes (Signed)
Occupational Therapy Treatment Patient Details Name: [name] [name] MRN: [id number] DOB: 1945-10-21 Today's Date: 02/15/2013 Time: 1336-1400 OT Time Calculation (min): 24 min  OT Assessment / Plan / Recommendation Comments on Treatment Session Pt continues to be limited by pain and fatigue and declined OOB activities this afternoon, but agreeable to sit EOB for activity. Was up with PT earlier this moring. Pt's PLOF was independent and would continue to benefit from acute OT services to maximize level of function and saftey to return home    Follow Up Recommendations  Home health OT;Supervision/Assistance - 24 hour    Barriers to Discharge   none    Equipment Recommendations  3 in 1 bedside comode;Tub/shower bench    Recommendations for Other Services    Frequency Min 2X/week   Plan      Precautions / Restrictions Precautions Precautions: Fall Restrictions Weight Bearing Restrictions: No   Pertinent Vitals/Pain 5/10 stomach    ADL  Grooming: Performed;Wash/dry hands;Wash/dry face;Supervision/safety;Set up Where Assessed - Grooming: Supported sitting Upper Body Bathing: Simulated;Minimal assistance Where Assessed - Upper Body Bathing: Supported sitting Transfers/Ambulation Related to ADLs: pt declined transfer to toilet or chair due to stomach ache and fatigue ADL Comments: pt delcined ADL mobility and transfers due to fatigue and stomach ache, agreeable to sitting EOB for functional tasks. Pt sat EOB x 9 minutes before requesting to return to supine    OT Diagnosis:    OT Problem List:   OT Treatment Interventions:     OT Goals ADL Goals ADL Goal: Grooming - Progress: Progressing toward goals ADL Goal: Toilet Transfer - Progress: Not progressing  Visit Information  Last OT Received On: 02/15/13 Assistance Needed: +1    Subjective Data  Subjective: " my stomach had been hurting " Patient Stated Goal: To return home   Prior Functioning       Cognition  Cognition Arousal/Alertness: Lethargic Behavior During Therapy: Flat affect Overall Cognitive Status: History of cognitive impairments - at baseline Area of Impairment: Orientation;Following commands;Awareness Current Attention Level: Alternating Problem Solving: Slow processing    Mobility  Bed Mobility Bed Mobility: Supine to Sit;Sitting - Scoot to Edge of Bed Supine to Sit: 4: Min assist Sitting - Scoot to [place] of Bed: 4: Min guard Details for Bed Mobility Assistance: Pt was slow to respond to cueing and took excessive time to coordinate his movements.  Transfers Transfers: Not assessed Details for Transfer Assistance: pt declined transfers    Exercises      Balance Balance Balance Assessed: Yes Dynamic Sitting Balance Dynamic Sitting - Balance Support: No upper extremity supported;Feet supported;During functional activity Dynamic Sitting - Level of Assistance: 5: Stand by assistance   End of Session OT - End of Session Activity Tolerance: Patient limited by fatigue;Patient limited by pain Patient left: in bed;with call bell/phone within reach;with family/visitor present  GO     Galen Manila 02/15/2013, 2:57 PM

## 2013-02-15 NOTE — Progress Notes (Signed)
Echocardiogram 2D Echocardiogram has been performed.  [name] [name] 02/15/2013, 11:43 AM

## 2013-02-15 NOTE — Progress Notes (Signed)
INITIAL NUTRITION ASSESSMENT  DOCUMENTATION CODES Per approved criteria  -Not Applicable   INTERVENTION: 1. Currently NPO, once diet is advanced, will add Glucerna Shake po BID, each supplement provides 220 kcal and 10 grams of protein.   NUTRITION DIAGNOSIS: Increased nutrient needs related to fevers as evidenced by weight loss with normal intake.   Goal: Diet advance to meet >/=90% estimated nutrition needs  Monitor:  Diet advance, weight trends, labs, I/O's  Reason for Assessment: Low Braden Score   67 y.o. male  Admitting Dx: Weakness generalized  ASSESSMENT: Pt admitted with increased weakness and fall. GI consulted for pt wth increased abdominal pain and N/V. CT scan, LFT, and hepatobiliary scan all negative.  Family noted pt may have been bitten by a tick in the past month, [place] spotted fever work up underway.  Weight hx reviewed, pt with 22 lb weight loss in the past 7 months, ~11% body weight loss. Pt denies any change in intake, appetite is normal.   Pt has increased nutrient needs given fevers.  Temp Readings from Last 3 Encounters:  02/15/13 100.2 F (37.9 C) Oral     Height: Ht Readings from Last 1 Encounters:  02/13/13 5\' 9"  (1.753 m)    Weight: Wt Readings from Last 1 Encounters:  02/15/13 182 lb 1.6 oz (82.6 kg)    Ideal Body Weight: 160 lbs   % Ideal Body Weight: 113%  Wt Readings from Last 10 Encounters:  02/15/13 182 lb 1.6 oz (82.6 kg)  07/20/12 204 lb (92.534 kg)  06/19/11 205 lb 12.8 oz (93.35 kg)  04/08/11 202 lb 2 oz (91.683 kg)  12/18/09 208 lb (94.348 kg)  07/13/09 211 lb (95.709 kg)  06/12/09 211 lb (95.709 kg)  03/29/09 208 lb (94.348 kg)    Usual Body Weight: 204 lbs   % Usual Body Weight: 89%  BMI:  Body mass index is 26.88 kg/(m^2). Overweight   Estimated Nutritional Needs: Kcal: 2000-2200 Protein: 80-90 gm  Fluid: 2-2.2 L   Skin: intact   Diet Order: NPO  EDUCATION NEEDS: -No education needs  identified at this time   Intake/Output Summary (Last 24 hours) at 02/15/13 1145 Last data filed at 02/15/13 0500  Gross per 24 hour  Intake 1267.68 ml  Output   1801 ml  Net -533.32 ml    Last BM: 6/24   Labs:   Recent Labs Lab 02/13/13 1556 02/14/13 0445 02/15/13 0805  NA 135 135 142  K 3.9 3.9 4.0  CL 101 101 110  CO2 23 21 22   BUN 19 22 22   CREATININE 1.31 1.28 1.35  CALCIUM 8.3* 8.5 7.9*  GLUCOSE 175* 291* 207*    CBG (last 3)   Recent Labs  02/14/13 1804 02/14/13 2343 02/15/13 0637  GLUCAP 186* 219* 179*    Scheduled Meds: . bisacodyl  10 mg Rectal BID  . docusate sodium  100 mg Oral BID  . doxycycline (VIBRAMYCIN) IV  100 mg Intravenous Q12H  . insulin aspart  0-9 Units Subcutaneous Q6H  . insulin detemir  22 Units Subcutaneous QHS  . metronidazole  500 mg Intravenous Q8H  . sodium chloride  3 mL Intravenous Q12H    Continuous Infusions: . pantoprozole (PROTONIX) infusion 8 mg/hr (02/14/13 2317)    Past Medical History  Diagnosis Date  . Diabetes mellitus   . GERD (gastroesophageal reflux disease)   . Obesity   . Cardiomyopathy, nonischemic   . Chronic anticoagulation   . Hypertension   .  ICD (implantable cardiac defibrillator) in place     has BiV in place.   . Acute on chronic diastolic heart failure     Past Surgical History  Procedure Laterality Date  . Cardiac catheterization  10/01/2001    THERE WAS GLOBAL HYPOKINESIS AND EF 25%. THERE APPEARED TO BE GLOBAL DECREASE IN WALL MOTION  . Lithotripsy  2001  . Cervical spine surgery  1994  . Cardiac defibrillator placement  06/2009    WITH GENERATOR REPLACED; BiV ICD  . US echocardiography  03/21/2008    EF 30-35%  . Cardiovascular stress test  03/20/2009    EF 33%    Clarene Duke RD, LDN Pager [phone] After Hours pager [phone]

## 2013-02-15 NOTE — Progress Notes (Signed)
Pt's temp 103.& tylenol po given as ordered and Dr. Thedore Mins notified and instructed ok to give motrin also.  Will continue to monitor.  Amanda Pea, RN

## 2013-02-15 NOTE — Progress Notes (Signed)
Pt. Alert and oriented to self and place.  Pt. Resting in bed quietly. Wife at bedside. No distress noted. Pt. Denies pain. Pt. With elevated temp this am of 102.8. PRN tylenol administered. RN will continue to monitor pt. For change in condition and inform oncoming RN of pt. Status. [name] [name], [name] [name]

## 2013-02-15 NOTE — Progress Notes (Signed)
[facility] Progress Note  Subjective: The patient is having some mild abdominal pain, mild nausea but currently n.p.o. still febrile  Objective: Vital signs in last 24 hours: Temp:  [99.3 F (37.4 C)-102.8 F (39.3 C)] 102.8 F (39.3 C) (06/24 0642) Pulse Rate:  [86-97] 90 (06/24 0600) Resp:  [16-20] 18 (06/24 0600) BP: (90-135)/(49-67) 110/67 mmHg (06/24 0600) SpO2:  [90 %-94 %] 91 % (06/24 0600) Weight:  [82.6 kg (182 lb 1.6 oz)] 82.6 kg (182 lb 1.6 oz) (06/24 0600) Weight change: -1.6 kg (-3 lb 8.4 oz)   PE: Abdomen soft minimally tender in the left upper quadrant  Lab Results: Results for orders placed during the hospital encounter of 02/12/13 (from the past 24 hour(s))  GLUCOSE, CAPILLARY     Status: Abnormal   Collection Time    02/14/13 11:42 AM      Result Value Range   Glucose-Capillary 250 (*) 70 - 99 mg/dL   Comment 1 Notify RN    HEPARIN LEVEL (UNFRACTIONATED)     Status: None   Collection Time    02/14/13  5:11 PM      Result Value Range   Heparin Unfractionated 0.46  0.30 - 0.70 IU/mL  GLUCOSE, CAPILLARY     Status: Abnormal   Collection Time    02/14/13  6:04 PM      Result Value Range   Glucose-Capillary 186 (*) 70 - 99 mg/dL  HEPARIN LEVEL (UNFRACTIONATED)     Status: Abnormal   Collection Time    02/14/13 11:30 PM      Result Value Range   Heparin Unfractionated 1.14 (*) 0.30 - 0.70 IU/mL    Studies/Results: Nm Hepatobiliary  02/14/2013   *RADIOLOGY REPORT*  Clinical Data:  Question cholecystitis.  Abdominal pain.  NUCLEAR MEDICINE HEPATOBILIARY IMAGING  Technique:  Sequential images of the abdomen were obtained out to 60 minutes following intravenous administration of radiopharmaceutical.  Radiopharmaceutical:  5.21mCi Tc-43m Choletec  Comparison:  CT 10/16/2012  Findings: There is homogeneous accumulation radiotracer within the liver and rapid clears the blood pool.  The gallbladder begins to fill at 20 minutes continues to fill 45  inches.  Counts are evident within the duodenum by 45 minutes.  IMPRESSION: Patent cystic duct and common bile duct.  No evidence of cholecystitis.   Original Report Authenticated By: Genevive Bi, M.D.   Ct Abdomen Pelvis W Contrast  02/13/2013   *RADIOLOGY REPORT*  Clinical Data: Fever, nausea, and vomiting  CT ABDOMEN AND PELVIS WITH CONTRAST  Technique:  Multidetector CT imaging of the abdomen and pelvis was performed following the standard protocol during bolus administration of intravenous contrast.  Contrast: OMNIPAQUE IOHEXOL 300 MG/ML  SOLN  Comparison: 03/10/2012  Findings: There is atelectasis noted in both lung bases.  No pericardial or pleural effusion.  No focal liver abnormalities identified.  Sludge versus tiny stones noted within the dependent portion of the gallbladder.  The pancreas is unremarkable.  There is a enhancing nodule in the distal tip of tail of pancreas this measures 1.3 cm and is unchanged from previous exam.  There are several small splenules adjacent to the inferior aspect of the spleen.  The adrenal glands both appear normal.  The right kidney is normal. The left kidney is normal.  No obstructive uropathy noted.  Urinary bladder is unremarkable.  Abdominal aorta appears normal in caliber.  No aneurysm.  There is mild calcified atherosclerotic disease identified.  No upper abdominal adenopathy identified.  There is no  pelvic or inguinal adenopathy.  The stomach appears normal.  The small bowel loops are within normal limits and caliber.  No evidence for obstruction.  The colon appears normal.  There is no free fluid or abnormal fluid collections identified within the abdomen or pelvis.  There is a periumbilical hernia which contains fat only.  Review of the visualized osseous structures is significant for mild lumbar degenerative disc disease.  IMPRESSION:  1.  No acute findings identified within the abdomen or pelvis. 2.  Sludge versus tiny stones within the dependent  portion of the gallbladder. 3.  Soft tissue attenuating enhancing nodule within the tip of the tail of pancreas.  This is unchanged in size from 03/10/2012 and is favored to represent a benign splenule. Presence of benign splenule within the tail of pancreas can be confirmed with heat damage red blood cell nuclear medicine scan.   Original Report Authenticated By: Signa Kell, M.D.   Dg Chest Port 1 View  02/13/2013   *RADIOLOGY REPORT*  Clinical Data: Abdominal pain.  Nausea and vomiting.  Fever and chest pain.  PORTABLE CHEST - 1 VIEW  Comparison: 02/12/2013  Findings: Heart size and pulmonary vascularity are normal and the lungs are clear.  AICD in place.  No acute osseous abnormality.  IMPRESSION: No acute abnormality.   Original Report Authenticated By: Francene Boyers, M.D.   Dg Abd Portable 2v  02/13/2013   *RADIOLOGY REPORT*  Clinical Data: Abdominal pain.  PORTABLE ABDOMEN - 2 VIEW  Comparison: Scout image for CT scan dated 03/10/2012  Findings: The bowel gas pattern is normal.  No free air or free fluid.  No osseous abnormality.  IMPRESSION: Benign-appearing abdomen.   Original Report Authenticated By: Francene Boyers, M.D.      Assessment: Persistent fevers on the background of gradual lethargy over about a months duration according to his wife as well as possible mental status changes with mild abdominal pain with negative CT scan liver function tests and hepatobiliary scan. At this point suspect generalized or primary GI infectious or inflammatory process. Plan: To perform EGD but I cannot think of any findings that would explain his fever. Right amount spotted fever titers are pending. If fevers and neurologic symptoms remain unexplained consider workup for Lyme disease, consider possible neurology for infectious disease consult.   Khalik Pewitt C 02/15/2013, 8:04 AM

## 2013-02-15 NOTE — Progress Notes (Addendum)
[facility]                                                                                Patient Demographics  [name] [name], is a 67 y.o. male, DOB - 1945-11-15, ZOX:[id number], WJX:[id number]  Admit date - 02/12/2013  Admitting Physician [name], MD  Outpatient Primary MD for the patient is [name], MD  LOS - 3   Chief Complaint  Patient presents with  . Weakness        Assessment & Plan   Summary   67 year old gentleman with early dementia, lives with his wife likes to go out in the woods has been brought in by wife for lethargy, generalized weakness and some confusion along with low-grade fevers of a dry cough and left quadrant abdominal pain and nausea likely 3-4 weeks, he was admitted to the hospital with diagnosis of viral syndrome however he continues to spike fevers, he was placed on Levaquin initially for atypical bronchitis however since he continued to spike fevers a CT scan of the abdomen and pelvis was done which was unremarkable except for biliary sludge, HIDA scan was then obtained which was negative, GI was called who are following the patient and planning to do EGD. Today the wife says that patient might have had a tick bite which they are not sure of about a month ago, tickborne disease panel has been ordered, Levaquin has been switched to doxycycline and ID has been requested to see the patient. Head CT is negative. MRI MRA cannot be done due to history of pacemaker.      Assessment and plan    1.Lethargy and Generalized weakness in a patient with early dementia- due to comminution of early bronchitis  + ? GI pathology, pending cultures,  he was placed on Levaquin initially for atypical bronchitis however since he continued to spike fevers a CT scan of the abdomen and pelvis was done which was unremarkable except for biliary sludge, HIDA scan was then obtained which was negative, GI was called who are following the patient and planning  to do EGD. Today the wife says that patient might have had a tick bite which they are not sure of about a month ago, tickborne disease panel has been ordered, Levaquin has been switched to doxycycline and ID has been requested to see the patient. Head CT is negative. MRI MRA cannot be done due to history of pacemaker.   Addendum - was paged by ID to obtain an LP, called [facility] in am, ordered appropriate tests, Vik K 2mg  IM now, repeat INR in AM. Note symptoms per wife 3-4 weeks, doubt this is bacterial meningitis. ID to adjust ABX/Antiviral. No headache, negative Kernig's and Brudzinski sign. Neck is supple.     2. Delirium in a dementia patient - he clearly has no headache, has supple neck with stable exam, CT brain stable, stable NH3, TSH, B12, ABG and RPR . Will have physical therapy evaluate him along with social worker for placement. Aspiration precautions and feeding assistance. Likely metabolic encephalopathy due to #1 above. Continue IV fluids for hydration and provide supportive care.    3. History of left bundle branch block,  chronic systolic heart failure, atrial fibrillation. 6 beat run of asymptomatic SVT on 02/13/2013. Patient is status post pacemaker placement - no acute issues, goal will be rate controlled, adjusted his home dose Coreg, since he is n.p.o. we had stopped Coumadin and placed him on heparin drip, however his platelet count continues to decline and after 24 hours heparin drip also has been held. We will defer long-term outpatient anticoagulation use to his primary cardiologist and primary care physician in the light of his dementia. He appears compensated from CHF standpoint. We'll get a baseline echo gram. Will also add IV Lopressor if needed.    4. Hypertension continue home medications which include Coreg and ACE inhibitor and monitor. Will add when necessary IV hydralazine and Lopressor.    5. Diabetes mellitus type 2. Oral medications held, since n.p.o. will  change sliding scale to every 6 hours, low-dose Lantus has been added closely monitor sugars.   No results found for this basename: HGBA1C    CBG (last 3)   Recent Labs  02/14/13 1804 02/14/13 2343 02/15/13 0637  GLUCAP 186* 219* 179*      6.Mild hyponatremia. Clinically appears dehydrated, gentle IV fluids, hold diuretics, improving with fluids.     7. Leukopenia and thrombocytopenia. Continue to monitor, question due to tick bourne disease, monitor closely. We'll hold aspirin for now along with heparin drip. Doubt this is HIT heparin was initiated yesterday.     Code Status: Full   Family Communication: With wife in detail   Disposition Plan: To be decided   Procedures CT scan head, CT scan abdomen pelvis,  HIDA scan, MRI brain could not be done due to pacemaker.  Echogram pending  Consults  Physical therapy, GI    DVT Prophylaxis  Heparin/Coumadin both on hold due to low thrombocytopenia will place him on SCDs  Lab Results  Component Value Date   PLT 98* 02/14/2013    Medications  Scheduled Meds: . aspirin  325 mg Oral Daily  . bisacodyl  10 mg Rectal BID  . docusate sodium  100 mg Oral BID  . insulin aspart  0-9 Units Subcutaneous Q6H  . insulin detemir  22 Units Subcutaneous QHS  . levofloxacin (LEVAQUIN) IV  750 mg Intravenous Q24H  . metronidazole  500 mg Intravenous Q8H  . sodium chloride  3 mL Intravenous Q12H   Continuous Infusions: . heparin 1,000 Units/hr (02/15/13 0847)  . pantoprozole (PROTONIX) infusion 8 mg/hr (02/14/13 2317)   PRN Meds:.acetaminophen, hydrALAZINE, metoprolol, morphine injection, ondansetron (ZOFRAN) IV, promethazine, sodium chloride  Antibiotics     Anti-infectives   Start     Dose/Rate Route Frequency Ordered Stop   02/14/13 0830  metroNIDAZOLE (FLAGYL) IVPB 500 mg     500 mg 100 mL/hr over 60 Minutes Intravenous Every 8 hours 02/14/13 0803     02/13/13 1100  levofloxacin (LEVAQUIN) IVPB 750 mg     750  mg 100 mL/hr over 90 Minutes Intravenous Every 24 hours 02/13/13 0954         Time Spent in minutes   35   Martine Bleecker K M.D on 02/15/2013 at 8:56 AM  Between 7am to 7pm - Pager - [phone]  After 7pm go to [email or web address] - password [email or web address]  And look for the night coverage person covering for me after hours  [facility] Office  [phone]    Subjective:   [name] [name] today has, No headache, No chest pain, No abdominal pain - No Nausea, No new weakness  tingling or numbness, Positive cough Cough - But no SOB.    Objective:   Filed Vitals:   02/15/13 0044 02/15/13 0300 02/15/13 0600 02/15/13 0642  BP:   110/67   Pulse:   90   Temp: 101.7 F (38.7 C) 100.8 F (38.2 C) 99.5 F (37.5 C) 102.8 F (39.3 C)  TempSrc: Oral  Oral Oral  Resp:   18   Height:      Weight:   82.6 kg (182 lb 1.6 oz)   SpO2:   91%     Wt Readings from Last 3 Encounters:  02/15/13 82.6 kg (182 lb 1.6 oz)  07/20/12 92.534 kg (204 lb)  06/19/11 93.35 kg (205 lb 12.8 oz)     Intake/Output Summary (Last 24 hours) at 02/15/13 0856 Last data filed at 02/15/13 0500  Gross per 24 hour  Intake 1743.43 ml  Output   1801 ml  Net -57.57 ml    Exam Awake Alert, Oriented X 2, No new F.N deficits, Normal affect, Patient has negative Kernig's and Brudzinski sign. Neck is supple. [place].AT,PERRAL No JVD, No cervical lymphadenopathy appriciated.  Symmetrical Chest wall movement, Good air movement bilaterally, CTAB RRR,No Gallops,Rubs or new Murmurs, No Parasternal Heave +ve B.Sounds, Abd Soft, Non tender, No organomegaly appriciated, No rebound - guarding or rigidity. No Cyanosis, Clubbing or edema, No new Rash or bruise      Data Review   Micro Results Recent Results (from the past 240 hour(s))  URINE CULTURE     Status: None   Collection Time    02/12/13  9:39 PM      Result Value Range Status   Specimen Description URINE, CLEAN CATCH   Final   Special Requests NONE   Final    Culture  Setup Time 02/13/2013 03:10   Final   Colony Count NO GROWTH   Final   Culture NO GROWTH   Final   Report Status 02/14/2013 FINAL   Final  CULTURE, BLOOD (ROUTINE X 2)     Status: None   Collection Time    02/13/13 10:50 AM      Result Value Range Status   Specimen Description BLOOD LEFT ARM   Final   Special Requests BOTTLES DRAWN AEROBIC ONLY 10CC   Final   Culture  Setup Time 02/13/2013 17:47   Final   Culture     Final   Value:        BLOOD CULTURE RECEIVED NO GROWTH TO DATE CULTURE WILL BE HELD FOR 5 DAYS BEFORE ISSUING A FINAL NEGATIVE REPORT   Report Status PENDING   Incomplete  CULTURE, BLOOD (ROUTINE X 2)     Status: None   Collection Time    02/13/13 10:58 AM      Result Value Range Status   Specimen Description BLOOD LEFT ARM   Final   Special Requests BOTTLES DRAWN AEROBIC ONLY 10CC   Final   Culture  Setup Time 02/13/2013 17:47   Final   Culture     Final   Value:        BLOOD CULTURE RECEIVED NO GROWTH TO DATE CULTURE WILL BE HELD FOR 5 DAYS BEFORE ISSUING A FINAL NEGATIVE REPORT   Report Status PENDING   Incomplete    Radiology Reports Dg Chest 2 View  02/12/2013   *RADIOLOGY REPORT*  Clinical Data: Weakness, fall, numbness in feet, headache, nausea, history diabetes, hypertension, nonischemic cardiomyopathy  CHEST - 2 VIEW  Comparison: 12/22/2003  Findings: Left  subclavian pacemaker / AICD leads project at right atrium, right ankle, and coronary sinus. Upper normal heart size. Normal mediastinal contours and pulmonary vascularity for AP technique. Lungs appear hyperinflated without infiltrate, pleural effusion or pneumothorax. Minimal peribronchial thickening. No acute osseous findings.  IMPRESSION: Post AICD. Bronchitic changes and hyperinflation without acute infiltrate.   Original Report Authenticated By: Ulyses Southward, M.D.   Ct Head Wo Contrast  02/12/2013   *RADIOLOGY REPORT*  Clinical Data: Frequent falls with unsteady gait and confusion.  CT HEAD  WITHOUT CONTRAST  Technique:  Contiguous axial images were obtained from the base of the skull through the vertex without contrast.  Comparison: None.  Findings: Mild to moderate atrophy.  Chronic microvascular ischemic change affects the periventricular and subcortical white matter. No acute stroke or hemorrhage.  No mass lesion or hydrocephalus. No extra-axial fluid.  Small calcifications in the mid pons, likely dystrophic from a small vascular malformation or remote infarction. No evidence for pontine mass.  Advanced vascular calcification. Calvarium intact.  Clear sinuses and mastoids.  IMPRESSION: Mild to moderate atrophy and chronic microvascular ischemic change. No acute intracranial findings.   Original Report Authenticated By: Davonna Belling, M.D.    CBC  Recent Labs Lab 02/12/13 1739 02/12/13 1745 02/13/13 1556 02/14/13 0445  WBC 3.1*  --  4.0 3.6*  HGB 12.9* 14.3 11.1* 11.5*  HCT 38.2* 42.0 33.1* 34.8*  PLT 157  --  112* 98*  MCV 73.9*  --  74.0* 74.7*  MCH 25.0*  --  24.8* 24.7*  MCHC 33.8  --  33.5 33.0  RDW 15.7*  --  16.0* 15.9*  LYMPHSABS 0.1*  --   --   --   MONOABS 0.2  --   --   --   EOSABS 0.0  --   --   --   BASOSABS 0.0  --   --   --     Chemistries   Recent Labs Lab 02/12/13 1739 02/12/13 1745 02/13/13 1556 02/14/13 0445  NA 129* 132* 135 135  K 3.9 4.0 3.9 3.9  CL 94* 97 101 101  CO2 24  --  23 21  GLUCOSE 453* 459* 175* 291*  BUN 21 21 19 22   CREATININE 1.21 1.40* 1.31 1.28  CALCIUM 9.4  --  8.3* 8.5  AST 21  --  31 59*  ALT 25  --  26 44  ALKPHOS 101  --  66 74  BILITOT 0.2*  --  0.2* 0.3   ------------------------------------------------------------------------------------------------------------------ estimated creatinine clearance is 54.5 ml/min (by C-G formula based on Cr of 1.28). ------------------------------------------------------------------------------------------------------------------ No results found for this basename: HGBA1C,  in  the last 72 hours ------------------------------------------------------------------------------------------------------------------ No results found for this basename: CHOL, HDL, LDLCALC, TRIG, CHOLHDL, LDLDIRECT,  in the last 72 hours ------------------------------------------------------------------------------------------------------------------  Recent Labs  02/13/13 0545  TSH 0.582   ------------------------------------------------------------------------------------------------------------------  Recent Labs  02/13/13 1050  VITAMINB12 838    Coagulation profile  Recent Labs Lab 02/12/13 1739 02/13/13 0545 02/14/13 0445  INR 2.25* 2.18* 1.49    No results found for this basename: DDIMER,  in the last 72 hours  Cardiac Enzymes  Recent Labs Lab 02/12/13 1739  TROPONINI <0.30   ------------------------------------------------------------------------------------------------------------------ No components found with this basename: POCBNP,

## 2013-02-15 NOTE — Progress Notes (Addendum)
ANTICOAGULATION CONSULT NOTE - Follow Up Consult  Pharmacy Consult for heparin Indication: atrial fibrillation  Heparin Dosing Weight: 84 kg   Labs:  Recent Labs  02/12/13 1739 02/12/13 1745 02/13/13 0545 02/13/13 1556 02/14/13 0445 02/14/13 1711 02/14/13 2330 02/15/13 0805  HGB 12.9* 14.3  --  11.1* 11.5*  --   --  11.8*  HCT 38.2* 42.0  --  33.1* 34.8*  --   --  34.9*  PLT 157  --   --  112* 98*  --   --  59*  APTT 57*  --   --   --   --   --   --   --   LABPROT 23.9*  --  23.3*  --  17.6*  --   --  21.1*  INR 2.25*  --  2.18*  --  1.49  --   --  1.90*  HEPARINUNFRC  --   --   --   --   --  0.46 1.14* 0.87*  CREATININE 1.21 1.40*  --  1.31 1.28  --   --   --   TROPONINI <0.30  --   --   --   --   --   --   --     Assessment: 67yo male remains supra-therapeutic (0.87) on heparin after dose adjustment down to 1000 units/hr. Note that patient was therapeutic on higher doses prior to cath but now requiring dose decreases.   Goal of Therapy:  Heparin level 0.3-0.7 units/ml   Plan:  Decrease heparin rate to 900 units/hr and recheck heparin level in 6 hours.   Link Snuffer, PharmD, BCPS Clinical Pharmacist [phone] 02/15/2013,9:25 AM  Addendum: Spoke with Dr. Thedore Mins regarding low platelets of 59. He wants to discontinue Heparin and has ordered SCDs. I discontinued both heparin drip and levels per Dr. Doristine Church telephone order.   Link Snuffer, PharmD, BCPS Clinical Pharmacist [phone] 02/15/2013, 9:44 AM

## 2013-02-15 NOTE — Progress Notes (Signed)
Physical Therapy Treatment Patient Details Name: [name] [name] MRN: [id number] DOB: [birth year] Today's Date: 02/15/2013 Time: 4098-1191 PT Time Calculation (min): 24 min  PT Assessment / Plan / Recommendation Comments on Treatment Session  Pt. c/o of pain in his stomach and stated that he felt nauseas during the treatment. Pt was febrile and very weak.  Spoke with RN prior to PT session & she ok'd to proceed with therapy to pt's tolerance.   Pt had a difficult time comprehending and following VC. Possible deficits in function today are due to pts fever and abdomen pain.  Did not ambulate due to pt c/o feeling nauseas & wanting to just sit back down.  His wife was informed that HHPT is still the current plan for d/c, but ultimately that the determination would continue to be monitored based on the pts status.     Follow Up Recommendations  Home health PT;Supervision/Assistance - 24 hour     Does the patient have the potential to tolerate intense rehabilitation     Barriers to Discharge        Equipment Recommendations  Rolling walker with 5" wheels    Recommendations for Other Services OT consult  Frequency Min 4X/week   Plan Discharge plan remains appropriate    Precautions / Restrictions Precautions Precautions: Fall Restrictions Weight Bearing Restrictions: No   Pertinent Vitals/Pain Pt c/o pain in the abdomen and was febrile.     Mobility  Bed Mobility Bed Mobility: Supine to Sit;Sitting - Scoot to Edge of Bed Supine to Sit: 4: Min assist Sitting - Scoot to [place] of Bed: 4: Min guard Details for Bed Mobility Assistance: Pt was slow to respond to cueing and took excessive time to coordinate his movements.  Transfers Transfers: Sit to Stand;Stand to [employer] Transfers Sit to Stand: 4: Min assist;With upper extremity assist;From bed Stand to Sit: 4: Min assist;With upper extremity assist;With armrests;To chair/3-in-1 Stand Pivot Transfers: 4: Min assist Details  for Transfer Assistance: Pt required repeated cueing for staying inside RW, pivoting with RW, and hand placement. Pt. needed physical assistance to turn walker during standing pivot transfer.  Ambulation/Gait Assistive device: Rolling walker    Exercises          PT Goals Acute Rehab PT Goals Time For Goal Achievement: 02/19/13 Potential to Achieve Goals: Good Pt will go Supine/Side to Sit: with modified independence PT Goal: Supine/Side to Sit - Progress: Progressing toward goal Pt will go Sit to Supine/Side: with modified independence Pt will go Sit to Stand: with modified independence PT Goal: Sit to Stand - Progress: Progressing toward goal Pt will go Stand to Sit: with modified independence PT Goal: Stand to Sit - Progress: Progressing toward goal Pt will Ambulate: >150 feet;with modified independence;with rolling walker Pt will Go Up / Down Stairs: 3-5 stairs;with supervision;with rolling walker;with least restrictive assistive device  Visit Information  Last PT Received On: 02/15/13 Assistance Needed: +2 (for safety and equipment)    Subjective Data  Subjective: Pt. was asleep in bed with wife present.    Cognition  Cognition Arousal/Alertness: Lethargic Behavior During Therapy: Flat affect Overall Cognitive Status: History of cognitive impairments - at baseline Area of Impairment: Orientation;Following commands;Awareness    Balance     End of Session PT - End of Session Equipment Utilized During Treatment: Gait belt Activity Tolerance: Patient limited by fatigue;Patient limited by pain;Treatment limited secondary to medical complications (Comment) Patient left: in chair;with call bell/phone within reach;with family/visitor present Nurse Communication: Mobility  status   GP     Jolyn Nap, Leda Gauze 02/15/2013, 9:39 AM   Verdell Face, PTA [phone] 02/15/2013

## 2013-02-15 NOTE — ED Provider Notes (Signed)
I saw and evaluated the patient, reviewed the resident's note and I agree with the findings and plan.   Loren Racer, MD 02/15/13 [phone]

## 2013-02-16 ENCOUNTER — Encounter (HOSPITAL_COMMUNITY): Payer: Self-pay | Admitting: Physician Assistant

## 2013-02-16 DIAGNOSIS — D696 Thrombocytopenia, unspecified: Secondary | ICD-10-CM

## 2013-02-16 DIAGNOSIS — I10 Essential (primary) hypertension: Secondary | ICD-10-CM

## 2013-02-16 DIAGNOSIS — E119 Type 2 diabetes mellitus without complications: Secondary | ICD-10-CM

## 2013-02-16 DIAGNOSIS — R4182 Altered mental status, unspecified: Secondary | ICD-10-CM

## 2013-02-16 LAB — GLUCOSE, CAPILLARY
Glucose-Capillary: 214 mg/dL — ABNORMAL HIGH (ref 70–99)
Glucose-Capillary: 255 mg/dL — ABNORMAL HIGH (ref 70–99)

## 2013-02-16 LAB — BASIC METABOLIC PANEL
CO2: 22 mEq/L (ref 19–32)
Chloride: 113 mEq/L — ABNORMAL HIGH (ref 96–112)
GFR calc Af Amer: 54 mL/min — ABNORMAL LOW (ref >90)
Sodium: 146 mEq/L — ABNORMAL HIGH (ref 135–145)

## 2013-02-16 LAB — CBC
Platelets: 45 10*3/uL — ABNORMAL LOW (ref 150–400)
RBC: 5.18 MIL/uL (ref 4.22–5.81)
RDW: 16.3 % — ABNORMAL HIGH (ref 11.5–15.5)
WBC: 1.9 10*3/uL — ABNORMAL LOW (ref 4.0–10.5)

## 2013-02-16 MED ORDER — CARVEDILOL 6.25 MG PO TABS
6.2500 mg | ORAL_TABLET | Freq: Two times a day (BID) | ORAL | Status: DC
  Administered 2013-02-16 – 2013-02-18 (×4): 6.25 mg via ORAL
  Filled 2013-02-16 (×6): qty 1

## 2013-02-16 MED ORDER — PHYTONADIONE 5 MG PO TABS
5.0000 mg | ORAL_TABLET | Freq: Once | ORAL | Status: AC
  Administered 2013-02-16: 5 mg via ORAL
  Filled 2013-02-16: qty 1

## 2013-02-16 NOTE — Consult Note (Signed)
[facility]  [facility]    SUBJECTIVE:     [name] [name] is a 67 year old male with DM II, HTN, hypothyroidism and Hx of aortocoronary bypass graft who presents to clinic for F/U.     HPI:   1. HTN   Amlodipine (5 MG) , losartan (50 MG) , metoprolol (50 MG) daily   Blood pressure normally 130/70 at home. Pt reports occ L side chest pain at rest (5 out of 10 ) for 12 years.  Does not use nitroglycerin because says pain is not that bad. No headaches, no palpitations.     2. DM   Insulin 30 units at night, Metformin BID, Jardiance 1x day   No increase thirst , dry mouth, no blurred vision , headaches   Blood sugar fasting 110    Blood sugar post- prandial 180-200     Patient Active Problem List   Diagnosis   . Atherosclerosis of coronary artery   . Hypercholesterolemia   . Essential hypertension   . Type 2 diabetes mellitus with microalbuminuria, with long-term current use of insulin (CMS-HCC)   . Status post coronary artery bypass graft   . Healthcare maintenance   . Acquired hypothyroidism   . Microcytic anemia   . Adenomatous polyp of colon, unspecified part of colon   . Benign prostatic hyperplasia with weak urinary stream   . Chronic bilateral low back pain without sciatica   . Keloid   . Chronic pain of both knees       No past surgical history on file.     No family history on file.    Social History     Socioeconomic History   . Marital status: Married   Tobacco Use   . Smoking status: Never   . Smokeless tobacco: Never   Substance and Sexual Activity   . Alcohol use: No   . Drug use: No       Current Outpatient Medications   Medication Instructions   . amLODIPINE (NORVASC) 5 mg, Oral, DAILY   . atorvastatin (LIPITOR) 40 mg, Oral, AT BEDTIME   . BD VEO INSULIN SYRINGE U/F 31G X 15/64" 1 ML MISC USE 1 SYRINGE 4 TIMES DAILY BEFORE MEAL(S) AND AT NIGHT   . Blood Glucose Monitoring Suppl (TRUETRACK BLOOD GLUCOSE) w/Device KIT Please give blood glucose machine with test strips and  lancets   . EQ ASPIRIN ADULT LOW DOSE 81 MG EC tablet Take 1 tablet by mouth once daily   . ferrous sulfate 325 (65 Fe) MG tablet Take 1 tablet by mouth twice daily   . glucose blood test strip 1 strip, Other, 3 TIMES DAILY BEFORE MEALS   . Insulin Pen Needle (PEN NEEDLES 3/16") 31G X 5 MM MISC Use one pen needle with each insulin administration.   . JARDIANCE 10 MG tablet Take 1 tablet by mouth once daily   . lancets 1 each, Other, 3 TIMES DAILY BEFORE MEALS   . LANTUS SOLOSTAR 100 UNIT/ML SOPN INJECT 30 UNITS SUBCUTANEOUSLY AT BEDTIME   . levothyroxine (SYNTHROID) 50 mcg, Oral, DAILY BEFORE BREAKFAST   . losartan (COZAAR) 50 mg, Oral, DAILY   . metFORMIN (GLUCOPHAGE) 1000 mg tablet TAKE 1 TABLET BY MOUTH TWICE DAILY WITH MEALS   . metoprolol succinate (TOPROL XL) 50 mg, Oral, DAILY   . nitroGLYcerin (NITROSTAT) 0.4 mg, Sublingual, EVERY 5 MIN PRN   . omeprazole (PRILOSEC) 20 mg, Oral, DAILY   .   potassium citrate (UROCIT-K) 10 MEQ (1080 MG) Sustained-Release tablet 10 mEq, Oral, 3 TIMES DAILY   . tamsulosin (FLOMAX) 0.4 mg, Oral, DAILY   . U-100 1 mL U-100 1 ML syringe 1 Syringe, Other, 4 TIMES DAILY BEFORE MEALS & NIGHTLY       No Known Allergies    OBJECTIVE:   BP 152/68   Pulse 72   Temp 98 F (36.7 C) (Temporal)   Resp 18   Ht 5' 10" (1.778 m)   Wt 70.5 kg (155 lb 7.1 oz)   BMI 22.30 kg/m   Chaperone: ***  General: No acute distress.  Heart: RRR, no murmurs   Chest: CTAB, no rales, ronchi   Extremities: No peripheral edema      Labs and Imaging:      ASSESSMENT & PLAN:     1. Stable Angina Pectoris   # {Healthcare Maintenance}  # {Body mass index is 22.3 kg/m.}  # {Nutrition and Exercise Counseling}  {Interventions done today to address healthcare maintenance, e.g. vaccines, cancer screening, screening tests}  - {Intervention #1}  - {Intervention #2}    Follow Up: No follow-ups on file.    Discussed the importance of monitoring labs as well as medication compliance and the risks of significant  morbidity and mortality if not done  Patient was instructed to call the clinic if has not heard back in regards to diagnostic tests within 1 week.  Discussed healthy diet and exercise  Patient was given strict return precautions should symptoms worsen or fail to improve for any concerns.    Alexis Pellecer, MS-3   [employer]

## 2013-02-16 NOTE — Consult Note (Signed)
NEURO HOSPITALIST CONSULT NOTE    Reason for Consult: AMS  HPI:                                                                                                                                          [name] [name] is an 67 y.o. male with history of DM, cardiomyopathy, afib on coumadin, S/P ICD and dementia with a progressive decline in his memory. Patient was brought to the ED after frequent falls at home and generalized weakness. In ED patient was found to have a fever with max temperatiure of 102. CXR, UA were negative for source.  CT head was negative for acute infarct. His Sodium was 129.  Ammonia, TSH, B12 and RPR returned normal. Pateint was started on IV fluids, epmeric Levoquin and flagyl.  On 02/13/2013 patient did have 6 beat run of asymptomatic SVT , however, since he is n.p.o. his Coumadin had been stopped and he was placed on heparin drip. ID was consulted and due to possible bug bite and recent symptoms of fatigue, thrombocytopenia and dropping platelets RMSF is being considered and patient started on Doxycyline.  Dr. Thedore Mins has consulted PCCM to obtain LP when capable.   In speaking with wife he is doing much better today and more alert.  He still feels very nauseated and ill but states he is more alert.   Past Medical History  Diagnosis Date  . Diabetes mellitus   . GERD (gastroesophageal reflux disease)   . Obesity   . Cardiomyopathy, nonischemic   . Chronic anticoagulation   . Hypertension   . ICD (implantable cardiac defibrillator) in place     has BiV in place.   . Acute on chronic diastolic heart failure     Past Surgical History  Procedure Laterality Date  . Cardiac catheterization  10/01/2001    THERE WAS GLOBAL HYPOKINESIS AND EF 25%. THERE APPEARED TO BE GLOBAL DECREASE IN WALL MOTION  . Lithotripsy  2001  . Cervical spine surgery  1994  . Cardiac defibrillator placement  06/2009    WITH GENERATOR REPLACED; BiV ICD  . US  echocardiography  03/21/2008    EF 30-35%  . Cardiovascular stress test  03/20/2009    EF 33%    Family History  Problem Relation Age of Onset  . Heart disease Mother   . Hypertension Mother   . Diabetes Mother   . Diabetes Father      Social History:  reports that he has never smoked. He does not have any smokeless tobacco history on file. He reports that he does not drink alcohol or use illicit drugs.  No Known Allergies  MEDICATIONS:  Prior to Admission:  Prescriptions prior to admission  Medication Sig Dispense Refill  . aspirin 325 MG tablet Take 325 mg by mouth daily.        [name] atorvastatin (LIPITOR) 10 MG tablet Take 10 mg by mouth daily.        . carvedilol (COREG) 12.5 MG tablet Take 12.5-18.75 mg by mouth 3 (three) times daily with meals. 1 tablet in the morning; 1 and a half tablet in the afternoon; 1 tablet at night      . esomeprazole (NEXIUM) 40 MG capsule Take 40 mg by mouth as needed (acid reflux).       . furosemide (LASIX) 40 MG tablet Take 40 mg by mouth 2 (two) times daily.      [name] glipiZIDE (GLUCOTROL) 10 MG tablet Take 10 mg by mouth 2 (two) times daily before a meal.      . insulin detemir (LEVEMIR) 100 UNIT/ML injection Inject 22 Units into the skin at bedtime. As directed      . metFORMIN (GLUCOPHAGE) 1000 MG tablet Take 500 mg by mouth 2 (two) times daily with a meal.       . pantoprazole (PROTONIX) 40 MG tablet Take 40 mg by mouth daily.        . rosiglitazone (AVANDIA) 2 MG tablet Take 2 mg by mouth daily.        . sitaGLIPtin (JANUVIA) 100 MG tablet Take 100 mg by mouth daily.      [name] spironolactone (ALDACTONE) 25 MG tablet Take 0.5 tablets (12.5 mg total) by mouth daily.  90 tablet  0  . valsartan (DIOVAN) 80 MG tablet Take 80 mg by mouth every morning.      . warfarin (COUMADIN) 5 MG tablet Take 10-15 mg by mouth daily. 15mg  Monday Wednesday,  Friday; 10mg  the rest of the week       Scheduled: . bisacodyl  10 mg Rectal BID  . docusate sodium  100 mg Oral BID  . doxycycline (VIBRAMYCIN) IV  100 mg Intravenous Q12H  . insulin aspart  0-9 Units Subcutaneous Q6H  . insulin detemir  22 Units Subcutaneous QHS  . metronidazole  500 mg Intravenous Q8H  . sodium chloride  3 mL Intravenous Q12H     ROS:                                                                                                                                       History obtained from the patient  General ROS: positive for - , fatigue, Psychological ROS: negative for - behavioral disorder, hallucinations, memory difficulties, mood swings or suicidal ideation Ophthalmic ROS: negative for - blurry vision, double vision, eye pain or loss of vision ENT ROS: negative for - epistaxis, nasal discharge, oral lesions, sore throat, tinnitus or vertigo Allergy and Immunology ROS: negative for - hives or itchy/watery eyes Hematological and Lymphatic ROS: negative for -  bleeding problems, bruising or swollen lymph nodes Endocrine ROS: negative for - galactorrhea, hair pattern changes, polydipsia/polyuria or temperature intolerance Respiratory ROS: negative for - cough, hemoptysis, shortness of breath or wheezing Cardiovascular ROS: negative for - chest pain, dyspnea on exertion, edema or irregular heartbeat Gastrointestinal ROS: negative for - abdominal pain, diarrhea, hematemesis, nausea/vomiting or stool incontinence Genito-Urinary ROS: negative for - dysuria, hematuria, incontinence or urinary frequency/urgency Musculoskeletal ROS: negative for - joint swelling or muscular weakness Neurological ROS: as noted in HPI Dermatological ROS: negative for rash and skin lesion changes   Blood pressure 105/59, pulse 102, temperature 98.7 F (37.1 C), temperature source Oral, resp. rate 20, height 5\' 9"  (1.753 m), weight 82.4 kg (181 lb 10.5 oz), SpO2 91.00%.   Neurologic  Examination:                                                                                                      Mental Status: Alert, oriented, thought content appropriate.  Speech fluent without evidence of aphasia.  Able to follow 3 step commands without difficulty. Cranial Nerves: II: Discs flat bilaterally; Visual fields grossly normal, pupils equal, round, reactive to light and accommodation III,IV, VI: ptosis not present, extra-ocular motions intact bilaterally V,VII: smile symmetric, facial light touch sensation normal bilaterally VIII: hearing normal bilaterally IX,X: gag reflex present XI: bilateral shoulder shrug XII: midline tongue extension Motor: Right : Upper extremity   5/5    Left:     Upper extremity   5/5  Lower extremity   5/5     Lower extremity   5/5 Tone and bulk:normal tone throughout; no atrophy noted Sensory: Pinprick and light touch intact throughout, bilaterally Deep Tendon Reflexes:  Right: Upper Extremity   Left: Upper extremity   biceps (C-5 to C-6) 2/4   biceps (C-5 to C-6) 2/4 tricep (C7) 2/4    triceps (C7) 2/4 Brachioradialis (C6) 2/4  Brachioradialis (C6) 2/4  Lower Extremity Lower Extremity  quadriceps (L-2 to L-4) 2/4   quadriceps (L-2 to L-4) 2/4 Achilles (S1) 1/4   Achilles (S1) 1/4  Plantars: Right: downgoing   Left: downgoing Cerebellar: normal finger-to-nose,  normal heel-to-shin test Gait: not tested CV: pulses palpable throughout    Lab Results  Component Value Date/Time   CHOL 169 01/10/2011  9:27 AM    Results for orders placed during the hospital encounter of 02/12/13 (from the past 48 hour(s))  GLUCOSE, CAPILLARY     Status: Abnormal   Collection Time    02/14/13 11:42 AM      Result Value Range   Glucose-Capillary 250 (*) 70 - 99 mg/dL   Comment 1 Notify RN    HEPARIN LEVEL (UNFRACTIONATED)     Status: None   Collection Time    02/14/13  5:11 PM      Result Value Range   Heparin Unfractionated 0.46  0.30 - 0.70 IU/mL    Comment:            IF HEPARIN RESULTS ARE BELOW     EXPECTED VALUES, AND PATIENT  DOSAGE HAS BEEN CONFIRMED,     SUGGEST FOLLOW UP TESTING     OF ANTITHROMBIN III LEVELS.  GLUCOSE, CAPILLARY     Status: Abnormal   Collection Time    02/14/13  6:04 PM      Result Value Range   Glucose-Capillary 186 (*) 70 - 99 mg/dL  HEPARIN LEVEL (UNFRACTIONATED)     Status: Abnormal   Collection Time    02/14/13 11:30 PM      Result Value Range   Heparin Unfractionated 1.14 (*) 0.30 - 0.70 IU/mL   Comment:            IF HEPARIN RESULTS ARE BELOW     EXPECTED VALUES, AND PATIENT     DOSAGE HAS BEEN CONFIRMED,     SUGGEST FOLLOW UP TESTING     OF ANTITHROMBIN III LEVELS.  GLUCOSE, CAPILLARY     Status: Abnormal   Collection Time    02/14/13 11:43 PM      Result Value Range   Glucose-Capillary 219 (*) 70 - 99 mg/dL  GLUCOSE, CAPILLARY     Status: Abnormal   Collection Time    02/15/13  6:37 AM      Result Value Range   Glucose-Capillary 179 (*) 70 - 99 mg/dL  HEPARIN LEVEL (UNFRACTIONATED)     Status: Abnormal   Collection Time    02/15/13  8:05 AM      Result Value Range   Heparin Unfractionated 0.87 (*) 0.30 - 0.70 IU/mL   Comment:            IF HEPARIN RESULTS ARE BELOW     EXPECTED VALUES, AND PATIENT     DOSAGE HAS BEEN CONFIRMED,     SUGGEST FOLLOW UP TESTING     OF ANTITHROMBIN III LEVELS.  PROTIME-INR     Status: Abnormal   Collection Time    02/15/13  8:05 AM      Result Value Range   Prothrombin Time 21.1 (*) 11.6 - 15.2 seconds   INR 1.90 (*) 0.00 - 1.49  CBC     Status: Abnormal   Collection Time    02/15/13  8:05 AM      Result Value Range   WBC 1.7 (*) 4.0 - 10.5 K/uL   RBC 4.71  4.22 - 5.81 MIL/uL   Hemoglobin 11.8 (*) 13.0 - 17.0 g/dL   HCT 16.1 (*) 09.6 - 04.5 %   MCV 74.1 (*) 78.0 - 100.0 fL   MCH 25.1 (*) 26.0 - 34.0 pg   MCHC 33.8  30.0 - 36.0 g/dL   RDW 40.9 (*) 81.1 - 91.4 %   Platelets 59 (*) 150 - 400 K/uL   Comment: CONSISTENT WITH PREVIOUS  RESULT  BASIC METABOLIC PANEL     Status: Abnormal   Collection Time    02/15/13  8:05 AM      Result Value Range   Sodium 142  135 - 145 mEq/L   Potassium 4.0  3.5 - 5.1 mEq/L   Chloride 110  96 - 112 mEq/L   CO2 22  19 - 32 mEq/L   Glucose, Bld 207 (*) 70 - 99 mg/dL   BUN 22  6 - 23 mg/dL   Creatinine, Ser 7.82  0.50 - 1.35 mg/dL   Calcium 7.9 (*) 8.4 - 10.5 mg/dL   GFR calc non Af Amer 52 (*) >90 mL/min   GFR calc Af Amer 60 (*) >90 mL/min   Comment:  The eGFR has been calculated     using the CKD EPI equation.     This calculation has not been     validated in all clinical     situations.     eGFR's persistently     <90 mL/min signify     possible Chronic Kidney Disease.  DIFFERENTIAL     Status: Abnormal   Collection Time    02/15/13  8:05 AM      Result Value Range   Neutrophils Relative % 89 (*) 43 - 77 %   Neutro Abs 1.6 (*) 1.7 - 7.7 K/uL   Lymphocytes Relative 6 (*) 12 - 46 %   Lymphs Abs 0.1 (*) 0.7 - 4.0 K/uL   Monocytes Relative 3  3 - 12 %   Monocytes Absolute 0.1  0.1 - 1.0 K/uL   Eosinophils Relative 2  0 - 5 %   Eosinophils Absolute 0.0  0.0 - 0.7 K/uL   Basophils Relative 0  0 - 1 %   Basophils Absolute 0.0  0.0 - 0.1 K/uL  GLUCOSE, CAPILLARY     Status: Abnormal   Collection Time    02/15/13 11:31 AM      Result Value Range   Glucose-Capillary 193 (*) 70 - 99 mg/dL   Comment 1 Notify RN    GLUCOSE, CAPILLARY     Status: Abnormal   Collection Time    02/15/13  4:53 PM      Result Value Range   Glucose-Capillary 232 (*) 70 - 99 mg/dL   Comment 1 Notify RN    GLUCOSE, CAPILLARY     Status: Abnormal   Collection Time    02/15/13 11:11 PM      Result Value Range   Glucose-Capillary 252 (*) 70 - 99 mg/dL   Comment 1 Documented in Chart     Comment 2 Notify RN    CBC     Status: Abnormal   Collection Time    02/16/13  4:15 AM      Result Value Range   WBC 1.9 (*) 4.0 - 10.5 K/uL   RBC 5.18  4.22 - 5.81 MIL/uL   Hemoglobin 13.0  13.0  - 17.0 g/dL   HCT 24.4 (*) 01.0 - 27.2 %   MCV 75.1 (*) 78.0 - 100.0 fL   MCH 25.1 (*) 26.0 - 34.0 pg   MCHC 33.4  30.0 - 36.0 g/dL   RDW 53.6 (*) 64.4 - 03.4 %   Platelets 45 (*) 150 - 400 K/uL   Comment: PLATELET COUNT CONFIRMED BY SMEAR     REPEATED TO VERIFY     SPECIMEN CHECKED FOR CLOTS  BASIC METABOLIC PANEL     Status: Abnormal   Collection Time    02/16/13  4:15 AM      Result Value Range   Sodium 146 (*) 135 - 145 mEq/L   Potassium 4.2  3.5 - 5.1 mEq/L   Chloride 113 (*) 96 - 112 mEq/L   CO2 22  19 - 32 mEq/L   Glucose, Bld 235 (*) 70 - 99 mg/dL   BUN 27 (*) 6 - 23 mg/dL   Creatinine, Ser 7.42 (*) 0.50 - 1.35 mg/dL   Calcium 8.1 (*) 8.4 - 10.5 mg/dL   GFR calc non Af Amer 47 (*) >90 mL/min   GFR calc Af Amer 54 (*) >90 mL/min   Comment:            The eGFR has  been calculated     using the CKD EPI equation.     This calculation has not been     validated in all clinical     situations.     eGFR's persistently     <90 mL/min signify     possible Chronic Kidney Disease.  PROTIME-INR     Status: Abnormal   Collection Time    02/16/13  4:15 AM      Result Value Range   Prothrombin Time 19.6 (*) 11.6 - 15.2 seconds   INR 1.72 (*) 0.00 - 1.49  GLUCOSE, CAPILLARY     Status: Abnormal   Collection Time    02/16/13  7:03 AM      Result Value Range   Glucose-Capillary 204 (*) 70 - 99 mg/dL   Comment 1 Documented in Chart     Comment 2 Notify RN      Nm Hepatobiliary  02/14/2013   *RADIOLOGY REPORT*  Clinical Data:  Question cholecystitis.  Abdominal pain.  NUCLEAR MEDICINE HEPATOBILIARY IMAGING  Technique:  Sequential images of the abdomen were obtained out to 60 minutes following intravenous administration of radiopharmaceutical.  Radiopharmaceutical:  5.48mCi Tc-50m Choletec  Comparison:  CT 10/16/2012  Findings: There is homogeneous accumulation radiotracer within the liver and rapid clears the blood pool.  The gallbladder begins to fill at 20 minutes continues to  fill 45 inches.  Counts are evident within the duodenum by 45 minutes.  IMPRESSION: Patent cystic duct and common bile duct.  No evidence of cholecystitis.   Original Report Authenticated By: Genevive Bi, M.D.   Study Conclusions 2 D echo  - Left ventricle: The cavity size was mildly dilated. Wall thickness was normal. Systolic function was severely reduced. The estimated ejection fraction was in the range of 20% to 25%. Diffuse hypokinesis. Doppler parameters are consistent with abnormal left ventricular relaxation (grade 1 diastolic dysfunction). - Mitral valve: Mild regurgitation. - Left atrium: The atrium was mildly dilated.    Assessment/Plan: 67 YO male with known dementia presenting to the hospital with fever, N/V, and history of increasing weakness associated with increased memory decline.  While hospitalized patient has been febrile but recently afebrile over last 12 hours. B12, RPR, ammonia and TSH have returned normal. CT head was normal with no acute infarct or bleed.  Patient shows mild confusion that is waxing and waning while in hospital but seems to be improving over last two days. Exam shows alert and oriented male with no meningismus, full strength, able to follow all commands.   Considering patients thrombocytopenia, Afib, exam showing awake alert afebrile patient with no meningismus or confusion would hold off on LP at this time per neurology stand point.  Recommend:  1) Continue to treat underlying metabolic abnormalities  2) Continue ABX   Felicie Morn PA-C [facility] [phone]  I personally participated in this patient's evaluation and management, including formulating the above clinical impression and management recommendations.  Venetia Maxon M.D. [facility] [phone]  02/16/2013, 11:21 AM

## 2013-02-16 NOTE — Progress Notes (Signed)
Rehab Admissions Coordinator Note:  Patient was screened by Meryl Dare for appropriateness for an Inpatient Acute Rehab Consult. Noted patient has functionally declined, from min assist to max assist since yesterday and that neuro consult has been made. Will defer CIR consult until work-up is complete. Will follow patient's progress. Please contact me for questions: [phone].  Meryl Dare 02/16/2013, 11:46 AM

## 2013-02-16 NOTE — Progress Notes (Signed)
[name] is a 67 yo hypertensive and diabetic male with dilated cardiomyopathy, atrial fibrillation on coumadin, NCIM s/p Bi-V ICD.  Subjective: He is feeling much better today. He had a fever of 103F yesterday evening but he is afebrile today. He had a mild headache too. He has a new shoulder pain today that is dull in nature, 3/10 in intensity, aggravated with movement and some soreness on pressing the joint.   Objective: Vital signs in last 24 hours: Filed Vitals:   02/15/13 1745 02/15/13 1900 02/15/13 2046 02/16/13 0610  BP:   128/64 111/55  Pulse:   92 90  Temp: 103.1 F (39.5 C) 101.9 F (38.8 C) 100 F (37.8 C) 98.5 F (36.9 C)  TempSrc: Oral  Oral Oral  Resp:   18 20  Height:      Weight:    82.4 kg (181 lb 10.5 oz)  SpO2:   97% 93%   Weight change: -0.2 kg (-7.1 oz)  Intake/Output Summary (Last 24 hours) at 02/16/13 0837 Last data filed at 02/16/13 0800  Gross per 24 hour  Intake 3588.83 ml  Output   2100 ml  Net 1488.83 ml   Physical Exam:  Blood pressure 124/60, pulse 94, temperature 101.4 F (38.6 C), temperature source Oral, resp. rate 20, height 5\' 9"  (1.753 m), weight 82.6 kg (182 lb 1.6 oz), SpO2 95.00%.  General: Patient was somnolent and sleeping during the interview and examination..  Head: Normocephalic and atraumatic  Ear: TM normal bilaterally  Nose: No erythema or drainage noted. Turbinates normal  Mouth: no erythema or exudates, dry tongue and mouth  Eyes: PERRL, EOMI, conjunctivae normal, No scleral icterus.  Neck: Supple with no stiffness, normal ROM, No JVD, mass, thyromegaly, or carotid bruit present.  Cardiovascular: RRR, S1 normal, S2 normal, no MRG, pulses symmetric and intact bilaterally  Pulmonary/Chest: normal respiratory effort, CTAB, no wheezes, rales, or rhonchi  Abdominal: Soft. tenderness in the right upper quadrant more pronounced during deep palpation, murphy sign negative, Neurological: CN II-XII intact, 3-4/5 strength in upper  limbs, 5/5 in lower limbs, no focal motor deficit, sensory intact to light touch bilaterally.   Lab Results: Basic Metabolic Panel:  Recent Labs Lab 02/15/13 0805 02/16/13 0415  NA 142 146*  K 4.0 4.2  CL 110 113*  CO2 22 22  GLUCOSE 207* 235*  BUN 22 27*  CREATININE 1.35 1.48*  CALCIUM 7.9* 8.1*   Liver Function Tests:  Recent Labs Lab 02/13/13 1556 02/14/13 0445  AST 31 59*  ALT 26 44  ALKPHOS 66 74  BILITOT 0.2* 0.3  PROT 6.2 6.5  ALBUMIN 2.7* 3.0*    Recent Labs Lab 02/13/13 1556  LIPASE 16    Recent Labs Lab 02/13/13 1050  AMMONIA 17   CBC:  Recent Labs Lab 02/12/13 1739  02/15/13 0805 02/16/13 0415  WBC 3.1*  < > 1.7* 1.9*  NEUTROABS 2.7  --  1.6*  --   HGB 12.9*  < > 11.8* 13.0  HCT 38.2*  < > 34.9* 38.9*  MCV 73.9*  < > 74.1* 75.1*  PLT 157  < > 59* 45*  < > = values in this interval not displayed. Cardiac Enzymes:  Recent Labs Lab 02/12/13 1739  TROPONINI <0.30   BNP: No results found for this basename: PROBNP,  in the last 168 hours D-Dimer: No results found for this basename: DDIMER,  in the last 168 hours CBG:  Recent Labs Lab 02/14/13 2343 02/15/13 0637 02/15/13 1131  02/15/13 1653 02/15/13 2311 02/16/13 0703  GLUCAP 219* 179* 193* 232* 252* 204*   Hemoglobin A1C: No results found for this basename: HGBA1C,  in the last 168 hours Fasting Lipid Panel: No results found for this basename: CHOL, HDL, LDLCALC, TRIG, CHOLHDL, LDLDIRECT,  in the last 168 hours Thyroid Function Tests:  Recent Labs Lab 02/13/13 0545  TSH 0.582   Coagulation:  Recent Labs Lab 02/13/13 0545 02/14/13 0445 02/15/13 0805 02/16/13 0415  LABPROT 23.3* 17.6* 21.1* 19.6*  INR 2.18* 1.49 1.90* 1.72*   Anemia Panel:  Recent Labs Lab 02/13/13 1050  VITAMINB12 838   Urine Drug Screen: Drugs of Abuse  No results found for this basename: labopia, cocainscrnur, labbenz, amphetmu, thcu, labbarb    Alcohol Level: No results found for  this basename: ETH,  in the last 168 hours Urinalysis:  Recent Labs Lab 02/12/13 2138 02/13/13 0953  COLORURINE YELLOW YELLOW  LABSPEC 1.024 1.021  PHURINE 5.5 5.5  GLUCOSEU >1000* >1000*  HGBUR NEGATIVE NEGATIVE  BILIRUBINUR NEGATIVE NEGATIVE  KETONESUR NEGATIVE NEGATIVE  PROTEINUR NEGATIVE NEGATIVE  UROBILINOGEN 0.2 1.0  NITRITE NEGATIVE NEGATIVE  LEUKOCYTESUR NEGATIVE NEGATIVE     Micro Results: Recent Results (from the past 240 hour(s))  URINE CULTURE     Status: None   Collection Time    02/12/13  9:39 PM      Result Value Range Status   Specimen Description URINE, CLEAN CATCH   Final   Special Requests NONE   Final   Culture  Setup Time 02/13/2013 03:10   Final   Colony Count NO GROWTH   Final   Culture NO GROWTH   Final   Report Status 02/14/2013 FINAL   Final  CULTURE, BLOOD (ROUTINE X 2)     Status: None   Collection Time    02/13/13 10:50 AM      Result Value Range Status   Specimen Description BLOOD LEFT ARM   Final   Special Requests BOTTLES DRAWN AEROBIC ONLY 10CC   Final   Culture  Setup Time 02/13/2013 17:47   Final   Culture     Final   Value:        BLOOD CULTURE RECEIVED NO GROWTH TO DATE CULTURE WILL BE HELD FOR 5 DAYS BEFORE ISSUING A FINAL NEGATIVE REPORT   Report Status PENDING   Incomplete  CULTURE, BLOOD (ROUTINE X 2)     Status: None   Collection Time    02/13/13 10:58 AM      Result Value Range Status   Specimen Description BLOOD LEFT ARM   Final   Special Requests BOTTLES DRAWN AEROBIC ONLY 10CC   Final   Culture  Setup Time 02/13/2013 17:47   Final   Culture     Final   Value:        BLOOD CULTURE RECEIVED NO GROWTH TO DATE CULTURE WILL BE HELD FOR 5 DAYS BEFORE ISSUING A FINAL NEGATIVE REPORT   Report Status PENDING   Incomplete   Studies/Results: Nm Hepatobiliary  02/14/2013   *RADIOLOGY REPORT*  Clinical Data:  Question cholecystitis.  Abdominal pain.  NUCLEAR MEDICINE HEPATOBILIARY IMAGING  Technique:  Sequential images of the  abdomen were obtained out to 60 minutes following intravenous administration of radiopharmaceutical.  Radiopharmaceutical:  5.35mCi Tc-59m Choletec  Comparison:  CT 10/16/2012  Findings: There is homogeneous accumulation radiotracer within the liver and rapid clears the blood pool.  The gallbladder begins to fill at 20 minutes continues to fill 45 inches.  Counts are evident within the duodenum by 45 minutes.  IMPRESSION: Patent cystic duct and common bile duct.  No evidence of cholecystitis.   Original Report Authenticated By: Genevive Bi, M.D.   Medications: I have reviewed the patient's current medications. Scheduled Meds: . bisacodyl  10 mg Rectal BID  . docusate sodium  100 mg Oral BID  . doxycycline (VIBRAMYCIN) IV  100 mg Intravenous Q12H  . insulin aspart  0-9 Units Subcutaneous Q6H  . insulin detemir  22 Units Subcutaneous QHS  . metronidazole  500 mg Intravenous Q8H  . sodium chloride  3 mL Intravenous Q12H   Continuous Infusions: . pantoprozole (PROTONIX) infusion 8 mg/hr (02/15/13 2034)   PRN Meds:.sodium chloride, acetaminophen, hydrALAZINE, ibuprofen, metoprolol, morphine injection, ondansetron (ZOFRAN) IV, promethazine, sodium chloride  Principal Problem:   Weakness generalized Active Problems:   AODM   CARDIOMYOPATHY, PRIMARY, DILATED   LBBB   SYSTOLIC HEART FAILURE, CHRONIC   HTN (hypertension)   Chronic anticoagulation   Atrial fibrillation   Biventricular implantable cardioverter-defibrillator in situ   Fever of undetermined origin  Assessment/Plan: 67 yo male presented with increasing confusion and frequent falls. He was reported to have a fever of 102 in ED and has been febrile since admission on 06/21.  RMSF:  He has severe leukopenia with WBC count of 1.7, thrombocytopenia with platelets 59, history of bug bite and staying in woods, progressive fatigue and confusion, fever.  -Doxycycline has already been started.  -We recommend to check for RMSF serology,  Lyme serology and Ehrlichia. Encephalitis:  His fever, headaches, confusion and vomiting in hospital also suggests encephalitis.  -We recommend LP after considering his INR and platelets level.  -If worsening of symptoms occur we recommend MRI to look for any changes in the brain parenchyma. TTP: He has a fever, confusion, thrombocytopenia, decreasing renal function and increased RDW that can point to the TTP.  This is a Psychologist, occupational Note.  The care of the patient was discussed with Dr. Judyann Munson and the assessment and plan formulated with their assistance.  Please see their attached note for official documentation of the daily encounter.   LOS: 4 days   Virgina Evener, Med Student 02/16/2013, 8:37 AM

## 2013-02-16 NOTE — Progress Notes (Signed)
Please see attending note

## 2013-02-16 NOTE — Progress Notes (Signed)
Physical Therapy Treatment Patient Details Name: [name] [name] MRN: [id number] DOB: [birth year] Today's Date: 02/16/2013 Time: 9562-1308 PT Time Calculation (min): 23 min  PT Assessment / Plan / Recommendation  PT Comments   Pt with noted regression in cognition and physical function. Pt now leaning to the right and with increased difficulty following commands/processing and sequencing. Pt now maxA for transfers and ambulation compared to minA on Sunday. Suspect possible CVA - MD notified. Per MD neurology consulted.  Follow Up Recommendations  CIR     Does the patient have the potential to tolerate intense rehabilitation     Barriers to Discharge        Equipment Recommendations  Rolling walker with 5" wheels    Recommendations for Other Services Rehab consult  Frequency Min 4X/week   Progress towards PT Goals    Plan Discharge plan needs to be updated;Frequency needs to be updated    Precautions / Restrictions Precautions Precautions: Fall Restrictions Weight Bearing Restrictions: No   Pertinent Vitals/Pain Minimal abdominal pain    Mobility  Bed Mobility Bed Mobility: Supine to Sit;Sitting - Scoot to Edge of Bed Supine to Sit: 2: Max assist;With rails;HOB flat Sitting - Scoot to [place] of Bed: 2: Max assist;With rail Details for Bed Mobility Assistance: pt required max verbal and tactile cues for sequencing pt did initiate moving LEs to EOB. Transfers Transfers: Sit to Stand;Stand to Sit Sit to Stand: 2: Max assist;With upper extremity assist;From bed Stand to Sit: With upper extremity assist;To chair/3-in-1;With armrests;1: +1 Total assist Details for Transfer Assistance: pt unable to sequencing reaching back for chair to sit. despite max verbal/tactile cues pt held onto walker requiring maxA to control descent into chair Ambulation/Gait Ambulation/Gait Assistance: 2: Max assist Ambulation Distance (Feet): 10 Feet Assistive device: Rolling walker Ambulation/Gait  Assistance Details: max directional verbal and tactile cues. pt with short, shuffled ataxic steps with + posterior and R lateral lean requiring maxA to maintain upright posture and to maintain midline. pt reported dizziness with upright position and ambulation Gait Pattern: Step-to pattern;Ataxic;Shuffle;Lateral trunk lean to right;Narrow base of support Gait velocity: slow General Gait Details: pt extremely unsteady and demo's regresssion from sunday Stairs: No    Exercises     PT Diagnosis:    PT Problem List:   PT Treatment Interventions:     PT Goals    Visit Information  Last PT Received On: 02/16/13 Assistance Needed: +2 History of Present Illness: Pt admitted for syncopal episode resulting in fall    Subjective Data  Subjective: Pt received supine in bed leaning to the Right. Wife reports "He's been doing this since yesterday."   Cognition  Cognition Arousal/Alertness: Lethargic Behavior During Therapy: Flat affect Overall Cognitive Status: Impaired/Different from baseline Area of Impairment: Orientation;Attention;Following commands;Safety/judgement;Problem solving Orientation Level: Disoriented to;Time Current Attention Level: Sustained Following Commands: Follows one step commands inconsistently;Follows one step commands with increased time Safety/Judgement: Decreased awareness of safety;Decreased awareness of deficits Awareness: Intellectual Problem Solving: Slow processing;Difficulty sequencing;Requires verbal cues;Requires tactile cues General Comments: pt with extremely delayed response time    Balance  Dynamic Sitting Balance Dynamic Sitting - Balance Support: No upper extremity supported;Feet supported;During functional activity Dynamic Sitting - Level of Assistance: 3: Mod assist Static Standing Balance Static Standing - Balance Support: Bilateral upper extremity supported;During functional activity Static Standing - Level of Assistance: 2: Max  assist Static Standing - Comment/# of Minutes: pt with R lateral lean and posterior lean  End of Session PT - End of  Session Equipment Utilized During Treatment: Gait belt Activity Tolerance: Patient limited by fatigue;Patient limited by lethargy Patient left: in chair;with call bell/phone within reach;with family/visitor present Nurse Communication:  (reported to RN suspect possible CVA)   GP     Marcene Brawn 02/16/2013, 10:59 AM  Lewis Shock, PT, DPT Pager #: [phone] Office #: [phone]

## 2013-02-16 NOTE — Progress Notes (Addendum)
[facility] PROGRESS NOTE  [name] [name] ZOX:[id number] DOB: 1945-10-11 DOA: 02/12/2013 PCP: [name], MD  Assessment/Plan: Lethargy and Generalized weakness in a patient with early dementia- due to comminution of early bronchitis + ? GI pathology  -Levaquin initially for atypical bronchitis however since he continued to spike fevers a CT scan of the abdomen and pelvis was done which was unremarkable except for biliary sludge,  -HIDA scan was then obtained which was negative,  -GI was called who are following the patient   -wife says that patient might have had a tick bite which they are not sure of about a month ago, tickborne disease panel has been ordered, -Levaquin has been switched to doxycycline and ID has been requested to see the patient. Head CT is negative. MRI MRA cannot be done due to history of pacemaker.  -ID to adjust ABX/Antiviral. No headache, negative Kernig's and Brudzinski sign. Neck is supple.  2. Delirium in a dementia patient -  -pt complains of intermitten headache -has supple neck with stable exam, CT brain stable,   -NEG NH3, TSH, B12, ABG and RPR .  -physical therapy evaluate him along with social worker for placement. -Aspiration precautions and feeding assistance. Likely metabolic encephalopathy due to #1 above.  -consulted neurology -INR still 1.7-->give po vitamin K today -can give FFP if neurology feels LP is urgent -PT informed me that pt has become weaker since hospitalization 3. History of left bundle branch block, chronic systolic heart failure, atrial fibrillation.  -6 beat run of asymptomatic SVT on 02/13/2013. Patient is status post pacemaker placement - no acute issues, goal will be rate controlled, adjusted his home dose Coreg, since he is n.p.o. we had stopped Coumadin and placed him on heparin drip, however his platelet count continues to decline and after 24 hours heparin drip also has been held. We will defer long-term  outpatient anticoagulation use to his primary cardiologist. He appears compensated from CHF standpoint. -consulted cardiology for NICM and long term anticoag needs -give po vitamin K in preparation for LP 4. Hypertension continue home medications which include Coreg and ACE inhibitor and monitor. Will add when necessary IV hydralazine and Lopressor.  5. Diabetes mellitus type 2. Oral medications held, since n.p.o. will change sliding scale to every 6 hours, low-dose  -Levemir has been added closely monitor sugars.         Family Communication:   Pt at beside Disposition Plan:   Home vs SNF      Procedures/Studies: Dg Chest 2 View  02/12/2013   *RADIOLOGY REPORT*  Clinical Data: Weakness, fall, numbness in feet, headache, nausea, history diabetes, hypertension, nonischemic cardiomyopathy  CHEST - 2 VIEW  Comparison: 12/22/2003  Findings: Left subclavian pacemaker / AICD leads project at right atrium, right ankle, and coronary sinus. Upper normal heart size. Normal mediastinal contours and pulmonary vascularity for AP technique. Lungs appear hyperinflated without infiltrate, pleural effusion or pneumothorax. Minimal peribronchial thickening. No acute osseous findings.  IMPRESSION: Post AICD. Bronchitic changes and hyperinflation without acute infiltrate.   Original Report Authenticated By: Ulyses Southward, M.D.   Ct Head Wo Contrast  02/12/2013   *RADIOLOGY REPORT*  Clinical Data: Frequent falls with unsteady gait and confusion.  CT HEAD WITHOUT CONTRAST  Technique:  Contiguous axial images were obtained from the base of the skull through the vertex without contrast.  Comparison: None.  Findings: Mild to moderate atrophy.  Chronic microvascular ischemic change affects the periventricular and subcortical white matter. No acute stroke or hemorrhage.  No mass lesion or hydrocephalus. No extra-axial fluid.  Small calcifications in the mid pons, likely dystrophic from a small vascular malformation or  remote infarction. No evidence for pontine mass.  Advanced vascular calcification. Calvarium intact.  Clear sinuses and mastoids.  IMPRESSION: Mild to moderate atrophy and chronic microvascular ischemic change. No acute intracranial findings.   Original Report Authenticated By: Davonna Belling, M.D.   Nm Hepatobiliary  02/14/2013   *RADIOLOGY REPORT*  Clinical Data:  Question cholecystitis.  Abdominal pain.  NUCLEAR MEDICINE HEPATOBILIARY IMAGING  Technique:  Sequential images of the abdomen were obtained out to 60 minutes following intravenous administration of radiopharmaceutical.  Radiopharmaceutical:  5.28mCi Tc-95m Choletec  Comparison:  CT 10/16/2012  Findings: There is homogeneous accumulation radiotracer within the liver and rapid clears the blood pool.  The gallbladder begins to fill at 20 minutes continues to fill 45 inches.  Counts are evident within the duodenum by 45 minutes.  IMPRESSION: Patent cystic duct and common bile duct.  No evidence of cholecystitis.   Original Report Authenticated By: Genevive Bi, M.D.   Ct Abdomen Pelvis W Contrast  02/13/2013   *RADIOLOGY REPORT*  Clinical Data: Fever, nausea, and vomiting  CT ABDOMEN AND PELVIS WITH CONTRAST  Technique:  Multidetector CT imaging of the abdomen and pelvis was performed following the standard protocol during bolus administration of intravenous contrast.  Contrast: OMNIPAQUE IOHEXOL 300 MG/ML  SOLN  Comparison: 03/10/2012  Findings: There is atelectasis noted in both lung bases.  No pericardial or pleural effusion.  No focal liver abnormalities identified.  Sludge versus tiny stones noted within the dependent portion of the gallbladder.  The pancreas is unremarkable.  There is a enhancing nodule in the distal tip of tail of pancreas this measures 1.3 cm and is unchanged from previous exam.  There are several small splenules adjacent to the inferior aspect of the spleen.  The adrenal glands both appear normal.  The right kidney is  normal. The left kidney is normal.  No obstructive uropathy noted.  Urinary bladder is unremarkable.  Abdominal aorta appears normal in caliber.  No aneurysm.  There is mild calcified atherosclerotic disease identified.  No upper abdominal adenopathy identified.  There is no pelvic or inguinal adenopathy.  The stomach appears normal.  The small bowel loops are within normal limits and caliber.  No evidence for obstruction.  The colon appears normal.  There is no free fluid or abnormal fluid collections identified within the abdomen or pelvis.  There is a periumbilical hernia which contains fat only.  Review of the visualized osseous structures is significant for mild lumbar degenerative disc disease.  IMPRESSION:  1.  No acute findings identified within the abdomen or pelvis. 2.  Sludge versus tiny stones within the dependent portion of the gallbladder. 3.  Soft tissue attenuating enhancing nodule within the tip of the tail of pancreas.  This is unchanged in size from 03/10/2012 and is favored to represent a benign splenule. Presence of benign splenule within the tail of pancreas can be confirmed with heat damage red blood cell nuclear medicine scan.   Original Report Authenticated By: Signa Kell, M.D.   Dg Chest Port 1 View  02/13/2013   *RADIOLOGY REPORT*  Clinical Data: Abdominal pain.  Nausea and vomiting.  Fever and chest pain.  PORTABLE CHEST - 1 VIEW  Comparison: 02/12/2013  Findings: Heart size and pulmonary vascularity are normal and the lungs are clear.  AICD in place.  No acute osseous abnormality.  IMPRESSION:  No acute abnormality.   Original Report Authenticated By: Francene Boyers, M.D.   Dg Abd Portable 2v  02/13/2013   *RADIOLOGY REPORT*  Clinical Data: Abdominal pain.  PORTABLE ABDOMEN - 2 VIEW  Comparison: Scout image for CT scan dated 03/10/2012  Findings: The bowel gas pattern is normal.  No free air or free fluid.  No osseous abnormality.  IMPRESSION: Benign-appearing abdomen.   Original  Report Authenticated By: Francene Boyers, M.D.         Subjective: Wife states that patient is more lucid this morning. Patient complains of intermittent headache. Denies any fevers, chills, chest pain, shortness breath, nausea, vomiting, diarrhea. Continues to complain of epigastric discomfort.  Objective: Filed Vitals:   02/15/13 1745 02/15/13 1900 02/15/13 2046 02/16/13 0610  BP:   128/64 111/55  Pulse:   92 90  Temp: 103.1 F (39.5 C) 101.9 F (38.8 C) 100 F (37.8 C) 98.5 F (36.9 C)  TempSrc: Oral  Oral Oral  Resp:   18 20  Height:      Weight:    82.4 kg (181 lb 10.5 oz)  SpO2:   97% 93%    Intake/Output Summary (Last 24 hours) at 02/16/13 0957 Last data filed at 02/16/13 0946  Gross per 24 hour  Intake 3708.83 ml  Output   2100 ml  Net 1608.83 ml   Weight change: -0.2 kg (-7.1 oz) Exam:   General:  Pt is alert, follows commands appropriately, not in acute distress  HEENT: No icterus, No thrush,  [place]/AT  Cardiovascular: RRR, S1/S2, no rubs, no gallops  Respiratory: Bibasilar crackles. No wheezes. Good air movement.  Abdomen: Soft/+BS, non tender, non distended, no guarding  Extremities: No edema, No lymphangitis, No petechiae, No rashes, no synovitis  Data Reviewed: Basic Metabolic Panel:  Recent Labs Lab 02/12/13 1739 02/12/13 1745 02/13/13 1556 02/14/13 0445 02/15/13 0805 02/16/13 0415  NA 129* 132* 135 135 142 146*  K 3.9 4.0 3.9 3.9 4.0 4.2  CL 94* 97 101 101 110 113*  CO2 24  --  23 21 22 22   GLUCOSE 453* 459* 175* 291* 207* 235*  BUN 21 21 19 22 22  27*  CREATININE 1.21 1.40* 1.31 1.28 1.35 1.48*  CALCIUM 9.4  --  8.3* 8.5 7.9* 8.1*   Liver Function Tests:  Recent Labs Lab 02/12/13 1739 02/13/13 1556 02/14/13 0445  AST 21 31 59*  ALT 25 26 44  ALKPHOS 101 66 74  BILITOT 0.2* 0.2* 0.3  PROT 7.5 6.2 6.5  ALBUMIN 3.7 2.7* 3.0*    Recent Labs Lab 02/13/13 1556  LIPASE 16    Recent Labs Lab 02/13/13 1050  AMMONIA 17    CBC:  Recent Labs Lab 02/12/13 1739 02/12/13 1745 02/13/13 1556 02/14/13 0445 02/15/13 0805 02/16/13 0415  WBC 3.1*  --  4.0 3.6* 1.7* 1.9*  NEUTROABS 2.7  --   --   --  1.6*  --   HGB 12.9* 14.3 11.1* 11.5* 11.8* 13.0  HCT 38.2* 42.0 33.1* 34.8* 34.9* 38.9*  MCV 73.9*  --  74.0* 74.7* 74.1* 75.1*  PLT 157  --  112* 98* 59* 45*   Cardiac Enzymes:  Recent Labs Lab 02/12/13 1739  TROPONINI <0.30   BNP: No components found with this basename: POCBNP,  CBG:  Recent Labs Lab 02/15/13 0637 02/15/13 1131 02/15/13 1653 02/15/13 2311 02/16/13 0703  GLUCAP 179* 193* 232* 252* 204*    Recent Results (from the past 240 hour(s))  URINE CULTURE  Status: None   Collection Time    02/12/13  9:39 PM      Result Value Range Status   Specimen Description URINE, CLEAN CATCH   Final   Special Requests NONE   Final   Culture  Setup Time 02/13/2013 03:10   Final   Colony Count NO GROWTH   Final   Culture NO GROWTH   Final   Report Status 02/14/2013 FINAL   Final  CULTURE, BLOOD (ROUTINE X 2)     Status: None   Collection Time    02/13/13 10:50 AM      Result Value Range Status   Specimen Description BLOOD LEFT ARM   Final   Special Requests BOTTLES DRAWN AEROBIC ONLY 10CC   Final   Culture  Setup Time 02/13/2013 17:47   Final   Culture     Final   Value:        BLOOD CULTURE RECEIVED NO GROWTH TO DATE CULTURE WILL BE HELD FOR 5 DAYS BEFORE ISSUING A FINAL NEGATIVE REPORT   Report Status PENDING   Incomplete  CULTURE, BLOOD (ROUTINE X 2)     Status: None   Collection Time    02/13/13 10:58 AM      Result Value Range Status   Specimen Description BLOOD LEFT ARM   Final   Special Requests BOTTLES DRAWN AEROBIC ONLY 10CC   Final   Culture  Setup Time 02/13/2013 17:47   Final   Culture     Final   Value:        BLOOD CULTURE RECEIVED NO GROWTH TO DATE CULTURE WILL BE HELD FOR 5 DAYS BEFORE ISSUING A FINAL NEGATIVE REPORT   Report Status PENDING   Incomplete      Scheduled Meds: . bisacodyl  10 mg Rectal BID  . docusate sodium  100 mg Oral BID  . doxycycline (VIBRAMYCIN) IV  100 mg Intravenous Q12H  . insulin aspart  0-9 Units Subcutaneous Q6H  . insulin detemir  22 Units Subcutaneous QHS  . metronidazole  500 mg Intravenous Q8H  . sodium chloride  3 mL Intravenous Q12H   Continuous Infusions: . pantoprozole (PROTONIX) infusion 8 mg/hr (02/15/13 2034)     Ephram Kornegay, DO  [facility] Pager [phone]  If 7PM-7AM, please contact night-coverage [email or web address] Password [facility] 02/16/2013, 9:57 AM   LOS: 4 days

## 2013-02-16 NOTE — Progress Notes (Signed)
[facility] Progress Note  Subjective: Patient complaining of last abdominal pain today. Still having intermittent fevers  Objective: Vital signs in last 24 hours: Temp:  [98.5 F (36.9 C)-103.1 F (39.5 C)] 98.5 F (36.9 C) (06/25 0610) Pulse Rate:  [90-94] 90 (06/25 0610) Resp:  [18-20] 20 (06/25 0610) BP: (111-128)/(55-64) 111/55 mmHg (06/25 0610) SpO2:  [93 %-97 %] 93 % (06/25 0610) Weight:  [82.4 kg (181 lb 10.5 oz)] 82.4 kg (181 lb 10.5 oz) (06/25 0610) Weight change: -0.2 kg (-7.1 oz)   PE: Unchanged  Lab Results: Results for orders placed during the hospital encounter of 02/12/13 (from the past 24 hour(s))  GLUCOSE, CAPILLARY     Status: Abnormal   Collection Time    02/15/13 11:31 AM      Result Value Range   Glucose-Capillary 193 (*) 70 - 99 mg/dL   Comment 1 Notify RN    GLUCOSE, CAPILLARY     Status: Abnormal   Collection Time    02/15/13  4:53 PM      Result Value Range   Glucose-Capillary 232 (*) 70 - 99 mg/dL   Comment 1 Notify RN    GLUCOSE, CAPILLARY     Status: Abnormal   Collection Time    02/15/13 11:11 PM      Result Value Range   Glucose-Capillary 252 (*) 70 - 99 mg/dL   Comment 1 Documented in Chart     Comment 2 Notify RN    CBC     Status: Abnormal   Collection Time    02/16/13  4:15 AM      Result Value Range   WBC 1.9 (*) 4.0 - 10.5 K/uL   RBC 5.18  4.22 - 5.81 MIL/uL   Hemoglobin 13.0  13.0 - 17.0 g/dL   HCT 69.6 (*) 29.5 - 28.4 %   MCV 75.1 (*) 78.0 - 100.0 fL   MCH 25.1 (*) 26.0 - 34.0 pg   MCHC 33.4  30.0 - 36.0 g/dL   RDW 13.2 (*) 44.0 - 10.2 %   Platelets 45 (*) 150 - 400 K/uL  BASIC METABOLIC PANEL     Status: Abnormal   Collection Time    02/16/13  4:15 AM      Result Value Range   Sodium 146 (*) 135 - 145 mEq/L   Potassium 4.2  3.5 - 5.1 mEq/L   Chloride 113 (*) 96 - 112 mEq/L   CO2 22  19 - 32 mEq/L   Glucose, Bld 235 (*) 70 - 99 mg/dL   BUN 27 (*) 6 - 23 mg/dL   Creatinine, Ser 7.25 (*) 0.50 - 1.35 mg/dL    Calcium 8.1 (*) 8.4 - 10.5 mg/dL   GFR calc non Af Amer 47 (*) >90 mL/min   GFR calc Af Amer 54 (*) >90 mL/min  PROTIME-INR     Status: Abnormal   Collection Time    02/16/13  4:15 AM      Result Value Range   Prothrombin Time 19.6 (*) 11.6 - 15.2 seconds   INR 1.72 (*) 0.00 - 1.49  GLUCOSE, CAPILLARY     Status: Abnormal   Collection Time    02/16/13  7:03 AM      Result Value Range   Glucose-Capillary 204 (*) 70 - 99 mg/dL   Comment 1 Documented in Chart     Comment 2 Notify RN      Studies/Results: Nm Hepatobiliary  02/14/2013   *RADIOLOGY REPORT*  Clinical  Data:  Question cholecystitis.  Abdominal pain.  NUCLEAR MEDICINE HEPATOBILIARY IMAGING  Technique:  Sequential images of the abdomen were obtained out to 60 minutes following intravenous administration of radiopharmaceutical.  Radiopharmaceutical:  5.78mCi Tc-31m Choletec  Comparison:  CT 10/16/2012  Findings: There is homogeneous accumulation radiotracer within the liver and rapid clears the blood pool.  The gallbladder begins to fill at 20 minutes continues to fill 45 inches.  Counts are evident within the duodenum by 45 minutes.  IMPRESSION: Patent cystic duct and common bile duct.  No evidence of cholecystitis.   Original Report Authenticated By: Genevive Bi, M.D.      Assessment:  fever of unknown origin with some abdominal pain without any clear source from studies to date Plan: Agree with ID plans for LP and continued antibiotics while waiting on atypical bacterial serologies. Hold off on any further GI workup for now. We'll continue to follow with you.    Cannon Quinton C 02/16/2013, 9:13 AM

## 2013-02-16 NOTE — Consult Note (Signed)
CARDIOLOGY CONSULT NOTE  Patient ID: [name] [name], MRN: [id number], DOB/AGE: 1945-09-15 67 y.o. Admit date: 02/12/2013   Date of Consult: 02/16/2013 Primary Physician: [name], MD Primary Cardiologist: Graciela Husbands  Chief Complaint: weakness, fall, fever Reason for Consult: h/o NICM, weakness  HPI: Mr. Osgood is a 67 y/o M with history of DM, HTN, NICM EF 25% (nonobst CAD 2003), s/p CRT-D, atrial fibrillation on Coumadin who presented to [facility] 02/12/2013 with comlpaints of generalized weakness and fall. He has also had headaches and confusion. Per reports he has had dementia with progressive decline over the past year and frequent falls at home. His wife says he has tended to lose his balance at times. In the ER, he was found to have a fever of 102. Labs have shown hyponatremia (129 in adm), leukopenia (lowest 1.7), and hyperglycemia (453 on adm). He has also developed progressive thrombocytopenia (157 adm, 45). INR therapeutic at 2.25 on admit. CT head nonacute, UA unremarkable except for glycosuria, CXR with bronchitic changes, no acute infiltrate. Troponin neg x 2. He did develop generalized abdominal pain and vomiting. CT abd with some gallbladder sludge but no acute findings. GI has evaluated the patient and expanded antibiotic coverage for possible cholecystitis, but later found that Nm biliary scan was negative for such. The patient has also had delirium on top of baseline dementia this admission and is being evaluated for different causes of encephalopathy. Due to recent exposure in the woods and bug bite, doxycycline was added for RMSF or possible tick-borne illness. Coumadin is being held in prep for possible LP - he received vitamin K yesterday and today. We are asked to consult regarding his known NICM and assess for long-term anticoagulation needs.    His mental status has improved and is currently A+Ox3. Fever has broken. He still feels a little "drowsy."  Interrogation of ICD shows normal device function, only 6 beat run of NSVT on June 12. He denies any CP, SOB, palpitations, orthopnea, LEE or syncope.  Past Medical History  Diagnosis Date  . Diabetes mellitus   . GERD (gastroesophageal reflux disease)   . Obesity   . Cardiomyopathy, nonischemic     a. Cath 2003: mild nonobstructive CAD, EF 25% at that time.  . Chronic anticoagulation   . Hypertension   . ICD (implantable cardiac defibrillator) in place     a. 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  . Chronic systolic CHF (congestive heart failure)     a. NICM EF 25% dating back to at least 2003.  [name] A-fib     a. Noted on ICD interrogation 2012.  [name] NSVT (nonsustained ventricular tachycardia)     a. Noted on ICD interrogation in 2011.  [name] LBBB (left bundle branch block)   . CAD (coronary artery disease)     a. Nonobstructive by cath 09/2001.  [name] Kidney stone   . Dementia       Most Recent Cardiac Studies: 2D echo 02/15/13 - Left ventricle: The cavity size was mildly dilated. Wall thickness was normal. Systolic function was severely reduced. The estimated ejection fraction was in the range of 20% to 25%. Diffuse hypokinesis. Doppler parameters are consistent with abnormal left ventricular relaxation (grade 1 diastolic dysfunction). - Mitral valve: Mild regurgitation.   Cath 09/2001 HEMODYNAMIC DATA: The aortic pressure was 96/70, LV pressure was 93/15-27.  There was no aortic valve gradient noted on pullback.  LEFT VENTRICULOGRAPHY: The left ventricular angiogram was performed in the  RAO  position. Overall cardiac size was enlarged. There was global hypokinesia and  the global ejection fraction would be approximately 25%. There is no  significant mitral regurgitation noted. There appeared to be a global decrease  in wall motion.  ANGIOGRAPHIC DATA:  1. Right coronary artery: The right coronary artery is a small dominant  vessel. It is normal.  2. Left  main coronary artery: Left main coronary artery is normal.  3. Left anterior descending: The left anterior descending is a long  vessel that wraps around the apex. It has 30-40% narrowing after the  first diagonal vessel. It is normal otherwise. There is no flow-restricting  disease.  4. Left circumflex: The left circumflex has a 30% narrowing in a proximal  one-third. It is a reasonably large vessel that continues to the apex  and bifurcates. The third branch extends to the inferior wall.  OVERALL IMPRESSION:  1. Severe global left ventricular dysfunction with elevated left ventricular  filling pressures.  2. Mild coronary atherosclerosis with 30-40% narrowing in the proximal  left anterior descending and 30% narrowing in the left circumflex with  minimal disease in a small right coronary artery.  DISCUSSION: These findings are most compatible with cardiomyopathy. There is  some very mild arrhythmia-induced mitral regurgitation that was not felt to be  significant. We will need to do a 2-D echocardiogram in advantage for left  ventricular dysfunction.   Surgical History:  Past Surgical History  Procedure Laterality Date  . Cardiac catheterization  10/01/2001    THERE WAS GLOBAL HYPOKINESIS AND EF 25%. THERE APPEARED TO BE GLOBAL DECREASE IN WALL MOTION  . Lithotripsy  2001  . Cervical spine surgery  1994  . Cardiac defibrillator placement  06/2009    WITH GENERATOR REPLACED; BiV ICD  . US echocardiography  03/21/2008    EF 30-35%  . Cardiovascular stress test  03/20/2009    EF 33%     Home Meds: Prior to Admission medications   Medication Sig Start Date End Date Taking? Authorizing Provider  aspirin 325 MG tablet Take 325 mg by mouth daily.     Yes Historical Provider, MD  atorvastatin (LIPITOR) 10 MG tablet Take 10 mg by mouth daily.     Yes Historical Provider, MD  carvedilol (COREG) 12.5 MG tablet Take 12.5-18.75 mg by mouth 3 (three) times daily with meals. 1 tablet in the  morning; 1 and a half tablet in the afternoon; 1 tablet at night   Yes Historical Provider, MD  esomeprazole (NEXIUM) 40 MG capsule Take 40 mg by mouth as needed (acid reflux).    Yes Historical Provider, MD  furosemide (LASIX) 40 MG tablet Take 40 mg by mouth 2 (two) times daily.   Yes Historical Provider, MD  glipiZIDE (GLUCOTROL) 10 MG tablet Take 10 mg by mouth 2 (two) times daily before a meal.   Yes Historical Provider, MD  insulin detemir (LEVEMIR) 100 UNIT/ML injection Inject 22 Units into the skin at bedtime. As directed   Yes Historical Provider, MD  metFORMIN (GLUCOPHAGE) 1000 MG tablet Take 500 mg by mouth 2 (two) times daily with a meal.    Yes Historical Provider, MD  pantoprazole (PROTONIX) 40 MG tablet Take 40 mg by mouth daily.     Yes Historical Provider, MD  rosiglitazone (AVANDIA) 2 MG tablet Take 2 mg by mouth daily.     Yes Historical Provider, MD  sitaGLIPtin (JANUVIA) 100 MG tablet Take 100 mg by mouth daily.   Yes  Historical Provider, MD  spironolactone (ALDACTONE) 25 MG tablet Take 0.5 tablets (12.5 mg total) by mouth daily. 12/18/11  Yes Duke Salvia, MD  valsartan (DIOVAN) 80 MG tablet Take 80 mg by mouth every morning.   Yes Historical Provider, MD  warfarin (COUMADIN) 5 MG tablet Take 10-15 mg by mouth daily. 15mg  Monday Wednesday, Friday; 10mg  the rest of the week   Yes Historical Provider, MD    Inpatient Medications:  . bisacodyl  10 mg Rectal BID  . docusate sodium  100 mg Oral BID  . doxycycline (VIBRAMYCIN) IV  100 mg Intravenous Q12H  . insulin aspart  0-9 Units Subcutaneous Q6H  . insulin detemir  22 Units Subcutaneous QHS  . metronidazole  500 mg Intravenous Q8H  . sodium chloride  3 mL Intravenous Q12H   . pantoprozole (PROTONIX) infusion 8 mg/hr (02/16/13 1035)    Allergies: No Known Allergies  History   Social History  . Marital Status: Married    Spouse Name: N/A    Number of Children: N/A  . Years of Education: N/A   Occupational  History  . Not on file.   Social History Main Topics  . Smoking status: Never Smoker   . Smokeless tobacco: Not on file  . Alcohol Use: No  . Drug Use: No  . Sexually Active:    Other Topics Concern  . Not on file   Social History Narrative  . No narrative on file     Family History  Problem Relation Age of Onset  . Heart disease Mother   . Hypertension Mother   . Diabetes Mother   . Diabetes Father      Review of Systems:see above. Denies CP, SOB, palpitations, syncope, hematemesis, melena, BRBPR or hematuria. All other systems reviewed and are otherwise negative except as noted above.  Labs:  Lab Results  Component Value Date   WBC 1.9* 02/16/2013   HGB 13.0 02/16/2013   HCT 38.9* 02/16/2013   MCV 75.1* 02/16/2013   PLT 45* 02/16/2013    Recent Labs Lab 02/14/13 0445  02/16/13 0415  NA 135  < > 146*  K 3.9  < > 4.2  CL 101  < > 113*  CO2 21  < > 22  BUN 22  < > 27*  CREATININE 1.28  < > 1.48*  CALCIUM 8.5  < > 8.1*  PROT 6.5  --   --   BILITOT 0.3  --   --   ALKPHOS 74  --   --   ALT 44  --   --   AST 59*  --   --   GLUCOSE 291*  < > 235*  < > = values in this interval not displayed. Lab Results  Component Value Date   CHOL 169 01/10/2011   HDL 38.50* 01/10/2011   LDLCALC 98 01/10/2011   TRIG 165.0* 01/10/2011    Radiology/Studies:  Dg Chest 2 View  02/12/2013   *RADIOLOGY REPORT*  Clinical Data: Weakness, fall, numbness in feet, headache, nausea, history diabetes, hypertension, nonischemic cardiomyopathy  CHEST - 2 VIEW  Comparison: 12/22/2003  Findings: Left subclavian pacemaker / AICD leads project at right atrium, right ankle, and coronary sinus. Upper normal heart size. Normal mediastinal contours and pulmonary vascularity for AP technique. Lungs appear hyperinflated without infiltrate, pleural effusion or pneumothorax. Minimal peribronchial thickening. No acute osseous findings.  IMPRESSION: Post AICD. Bronchitic changes and hyperinflation without acute  infiltrate.   Original Report Authenticated By: Loraine Leriche  Tyron Russell, M.D.   Ct Head Wo Contrast  02/12/2013   *RADIOLOGY REPORT*  Clinical Data: Frequent falls with unsteady gait and confusion.  CT HEAD WITHOUT CONTRAST  Technique:  Contiguous axial images were obtained from the base of the skull through the vertex without contrast.  Comparison: None.  Findings: Mild to moderate atrophy.  Chronic microvascular ischemic change affects the periventricular and subcortical white matter. No acute stroke or hemorrhage.  No mass lesion or hydrocephalus. No extra-axial fluid.  Small calcifications in the mid pons, likely dystrophic from a small vascular malformation or remote infarction. No evidence for pontine mass.  Advanced vascular calcification. Calvarium intact.  Clear sinuses and mastoids.  IMPRESSION: Mild to moderate atrophy and chronic microvascular ischemic change. No acute intracranial findings.   Original Report Authenticated By: Davonna Belling, M.D.   Nm Hepatobiliary  02/14/2013   *RADIOLOGY REPORT*  Clinical Data:  Question cholecystitis.  Abdominal pain.  NUCLEAR MEDICINE HEPATOBILIARY IMAGING  Technique:  Sequential images of the abdomen were obtained out to 60 minutes following intravenous administration of radiopharmaceutical.  Radiopharmaceutical:  5.41mCi Tc-23m Choletec  Comparison:  CT 10/16/2012  Findings: There is homogeneous accumulation radiotracer within the liver and rapid clears the blood pool.  The gallbladder begins to fill at 20 minutes continues to fill 45 inches.  Counts are evident within the duodenum by 45 minutes.  IMPRESSION: Patent cystic duct and common bile duct.  No evidence of cholecystitis.   Original Report Authenticated By: Genevive Bi, M.D.   Ct Abdomen Pelvis W Contrast  02/13/2013   *RADIOLOGY REPORT*  Clinical Data: Fever, nausea, and vomiting  CT ABDOMEN AND PELVIS WITH CONTRAST  Technique:  Multidetector CT imaging of the abdomen and pelvis was performed following the  standard protocol during bolus administration of intravenous contrast.  Contrast: OMNIPAQUE IOHEXOL 300 MG/ML  SOLN  Comparison: 03/10/2012  Findings: There is atelectasis noted in both lung bases.  No pericardial or pleural effusion.  No focal liver abnormalities identified.  Sludge versus tiny stones noted within the dependent portion of the gallbladder.  The pancreas is unremarkable.  There is a enhancing nodule in the distal tip of tail of pancreas this measures 1.3 cm and is unchanged from previous exam.  There are several small splenules adjacent to the inferior aspect of the spleen.  The adrenal glands both appear normal.  The right kidney is normal. The left kidney is normal.  No obstructive uropathy noted.  Urinary bladder is unremarkable.  Abdominal aorta appears normal in caliber.  No aneurysm.  There is mild calcified atherosclerotic disease identified.  No upper abdominal adenopathy identified.  There is no pelvic or inguinal adenopathy.  The stomach appears normal.  The small bowel loops are within normal limits and caliber.  No evidence for obstruction.  The colon appears normal.  There is no free fluid or abnormal fluid collections identified within the abdomen or pelvis.  There is a periumbilical hernia which contains fat only.  Review of the visualized osseous structures is significant for mild lumbar degenerative disc disease.  IMPRESSION:  1.  No acute findings identified within the abdomen or pelvis. 2.  Sludge versus tiny stones within the dependent portion of the gallbladder. 3.  Soft tissue attenuating enhancing nodule within the tip of the tail of pancreas.  This is unchanged in size from 03/10/2012 and is favored to represent a benign splenule. Presence of benign splenule within the tail of pancreas can be confirmed with heat damage red blood  cell nuclear medicine scan.   Original Report Authenticated By: Signa Kell, M.D.   Dg Chest Port 1 View  02/13/2013   *RADIOLOGY REPORT*   Clinical Data: Abdominal pain.  Nausea and vomiting.  Fever and chest pain.  PORTABLE CHEST - 1 VIEW  Comparison: 02/12/2013  Findings: Heart size and pulmonary vascularity are normal and the lungs are clear.  AICD in place.  No acute osseous abnormality.  IMPRESSION: No acute abnormality.   Original Report Authenticated By: Francene Boyers, M.D.   Dg Abd Portable 2v  02/13/2013   *RADIOLOGY REPORT*  Clinical Data: Abdominal pain.  PORTABLE ABDOMEN - 2 VIEW  Comparison: Scout image for CT scan dated 03/10/2012  Findings: The bowel gas pattern is normal.  No free air or free fluid.  No osseous abnormality.  IMPRESSION: Benign-appearing abdomen.   Original Report Authenticated By: Francene Boyers, M.D.    EKG: atrial sensed ventricular paced rhythm 106bpm nsr  Physical Exam: Blood pressure 105/59, pulse 102, temperature 98.7 F (37.1 C), temperature source Oral, resp. rate 20, height 5\' 9"  (1.753 m), weight 181 lb 10.5 oz (82.4 kg), SpO2 91.00%. General: Well developed, well nourished AAM in no acute distress. Head: Normocephalic, atraumatic, sclera non-icteric, no xanthomas, nares are without discharge.  Neck: Negative for carotid bruits. JVD not elevated. Lungs: Clear bilaterally to auscultation without wheezes, rales, or rhonchi. Breathing is unlabored. Heart: RRR with S1 S2. No murmurs, rubs, or gallops appreciated. Abdomen: Soft, non-tender, non-distended with normoactive bowel sounds. No hepatomegaly. No rebound/guarding. No obvious abdominal masses. Msk:  Strength and tone appear normal for age. Extremities: No clubbing or cyanosis. No edema.  Distal pedal pulses are 2+ and equal bilaterally. Neuro: Alert and oriented X 3. No facial asymmetry. No focal deficit. Moves all extremities spontaneously. Psych:  Responds to questions appropriately with a normal affect.   Assessment and Plan:   1. Febrile illness with generalized weakness, question tickborne illness 2. Acute delirium with h/o  dementia, improved 3. Chronic systolic CHF/NICM s/p BiV-ICD, stable and euvolemic 4. H/o NSVT 5. H/o atrial fibrillation noted on remote device interrogation 6. Several falls/balance issues over the last year 7. Progressive thrombocytopenia and leukopenia 8. HTN, controlled  Pt appears euvolemic at this time. Would begin adding back cardiac meds slowly as BP allows. Will start with Coreg 6.25mg  BID. If BP remains stable, add back ACEI. He is not currently a good coumadin candidate given recent frequent falling and significant thrombocytopenia. See below for additional thoughts.  Signed, Ronie Spies PA-C 02/16/2013, 1:14 PM  Patient seen and examined and history reviewed. Agree with above findings and plan. 67 yo BM with history of cardiomyopathy admitted with acute febrile illness with delirium, thrombocytopenia, and leukopenia. Patient is stable from a cardiac standpoint with no overt CHF. ICD functioning normally. Some of cardiac meds held due to low BP. Agree with resuming Coreg at lower dose. If tolerated will resume ACEi tomorrow and titrate as BP allows. No clear indication for coumadin at this point and I would discontinue given thrombocytopenia and history of falls.  Theron [employer] [facility] 02/16/2013 2:25 PM

## 2013-02-16 NOTE — Progress Notes (Signed)
Inpatient Diabetes Program Recommendations  AACE/ADA: New Consensus Statement on Inpatient Glycemic Control (2013)  Target Ranges:  Prepandial:   less than 140 mg/dL      Peak postprandial:   less than 180 mg/dL (1-2 hours)      Critically ill patients:  140 - 180 mg/dL     Results for ERVIE, MCCARD (MRN [id number]) as of 02/16/2013 09:17  Ref. Range 02/15/2013 06:37 02/15/2013 11:31 02/15/2013 16:53 02/15/2013 23:11  Glucose-Capillary Latest Range: 70-99 mg/dL 829 (H) 562 (H) 130 (H) 252 (H)    Results for WAYLON, HERSHEY (MRN [id number]) as of 02/16/2013 09:17  Ref. Range 02/16/2013 07:03  Glucose-Capillary Latest Range: 70-99 mg/dL 295 (H)    Recommend the following in-hospital insulin adjustments to improve glycemic control:  1. Increase Levemir to 25 units QHS 2. Change Novolog Sensitive correction scale (SSI) to Q4 hour coverage while patient NPO (currently ordered Q6 hours)   Will follow. Ambrose Finland RN, MSN, CDE Diabetes Coordinator Inpatient Diabetes Program [phone]

## 2013-02-16 NOTE — Progress Notes (Signed)
[facility]    Date of Admission:  02/12/2013   Total days of antibiotics 5        Day 2 doxy        Day 3 metronidazole           ID: [name] [name] is a 67 y.o. male  presented with increasing confusion and frequent falls. He was reported to have a fever of 102 in ED and has been febrile since admission on 06/21. C/w FUO plus vague abdominal complaints with negative work up  Principal Problem:   Weakness generalized Active Problems:   AODM   CARDIOMYOPATHY, PRIMARY, DILATED   LBBB   SYSTOLIC HEART FAILURE, CHRONIC   HTN (hypertension)   Chronic anticoagulation   Atrial fibrillation   Biventricular implantable cardioverter-defibrillator in situ   Fever of undetermined origin    Subjective: Sleepy this morning, some intermittent right shoulder pain, chronic.febrile overnight  Medications:  . bisacodyl  10 mg Rectal BID  . carvedilol  6.25 mg Oral BID WC  . docusate sodium  100 mg Oral BID  . doxycycline (VIBRAMYCIN) IV  100 mg Intravenous Q12H  . insulin aspart  0-9 Units Subcutaneous Q6H  . insulin detemir  22 Units Subcutaneous QHS  . metronidazole  500 mg Intravenous Q8H  . sodium chloride  3 mL Intravenous Q12H    Objective: Vital signs in last 24 hours: Temp:  [97.7 F (36.5 C)-98.7 F (37.1 C)] 97.7 F (36.5 C) (06/25 2229) Pulse Rate:  [90-108] 108 (06/25 2229) Resp:  [18-20] 18 (06/25 2229) BP: (102-120)/(55-66) 112/62 mmHg (06/25 2229) SpO2:  [91 %-97 %] 92 % (06/25 2229) Weight:  [181 lb 10.5 oz (82.4 kg)] 181 lb 10.5 oz (82.4 kg) (06/25 0610) GEN: Patient sleeping in chair, easily arousable PSYCH: alert and oriented to self;in NAD HEENT: Mucous membranes pink and anicteric; PERRLA; EOM intact; no cervical lymphadenopathy nor thyromegaly or carotid bruit; no JVD; There were no stridor. Neck is very supple.  CHEST WALL: No tenderness  CHEST: Normal respiration, clear to auscultation bilaterally.  HEART: Regular rate  and rhythm. There are no murmur, rub, or gallops.  BACK: No kyphosis or scoliosis; no CVA tenderness  ABDOMEN: soft and non-tender; no masses, no organomegaly, normal abdominal bowel sounds; no pannus; no intertriginous candida. There is no rebound and no distention.  EXTREMITIES: No bone or joint deformity; age-appropriate arthropathy of the hands and knees; no edema; no ulcerations. There is no calf tenderness.  PULSES: 2+ and symmetric  SKIN: Normal hydration no rash or ulceration     Lab Results  Recent Labs  02/15/13 0805 02/16/13 0415  WBC 1.7* 1.9*  HGB 11.8* 13.0  HCT 34.9* 38.9*  NA 142 146*  K 4.0 4.2  CL 110 113*  CO2 22 22  BUN 22 27*  CREATININE 1.35 1.48*   Liver Panel  Recent Labs  02/14/13 0445  PROT 6.5  ALBUMIN 3.0*  AST 59*  ALT 44  ALKPHOS 74  BILITOT 0.3    Microbiology: 6/21 urine cx NGTD 6/22 blood cx NGTD 6/24 blood cx NGTD  B. burdorferi negative  rpr negative  Studies/Results: No results found.   Assessment/Plan: 67yo M with DM, NICM presents with FUO, weakness and confusion. Concern for developing leukopenia, thrombocytopenia with high fevers x 4 days, etiology for presentation is presently unknown although outdoor insect bite RF is concerning for tickborne illness   - continue with iv doxycycline 100mg  IV Q12hr  -  will watch for labs for RMSF, erhlichia ab, negative for lyme disease  - recommend to get LP to send CSF for cell count, gram stain, culture, HSV this would likely need to be coordinated in holding his coumadin, as well as possibly needing plt tsf to increase platelets  - if encephalopathy worsens in setting of fever, will recommend to start antibiotics for meningoencephalitis, understand that he is unable to get mri due to bi-V device - consider work up for ttp, checking peripheral blood smear, hapto, fibrinogen as another etiology for his ams, thrombocytopenia and fevers.   [facility] Cell: [phone] Pager: [phone]  02/16/2013, 10:32 PM

## 2013-02-17 DIAGNOSIS — R4182 Altered mental status, unspecified: Secondary | ICD-10-CM

## 2013-02-17 DIAGNOSIS — D72819 Decreased white blood cell count, unspecified: Secondary | ICD-10-CM

## 2013-02-17 LAB — BASIC METABOLIC PANEL
Chloride: 117 mEq/L — ABNORMAL HIGH (ref 96–112)
GFR calc Af Amer: 65 mL/min — ABNORMAL LOW (ref >90)
GFR calc non Af Amer: 56 mL/min — ABNORMAL LOW (ref >90)
Potassium: 4.5 mEq/L (ref 3.5–5.1)

## 2013-02-17 LAB — HEPARIN INDUCED THROMBOCYTOPENIA PNL
Heparin Induced Plt Ab: NEGATIVE
Patient O.D.: 0.062
UFH Low Dose 0.1 IU/mL: 0 % Release
UFH Low Dose 0.5 IU/mL: 0 % Release

## 2013-02-17 LAB — FIBRINOGEN: Fibrinogen: 440 mg/dL (ref 204–475)

## 2013-02-17 LAB — GLUCOSE, CAPILLARY
Glucose-Capillary: 305 mg/dL — ABNORMAL HIGH (ref 70–99)
Glucose-Capillary: 297 mg/dL — ABNORMAL HIGH (ref 70–99)

## 2013-02-17 LAB — PROTIME-INR: Prothrombin Time: 14.1 seconds (ref 11.6–15.2)

## 2013-02-17 LAB — PATHOLOGIST SMEAR REVIEW

## 2013-02-17 LAB — CBC
Platelets: 54 10*3/uL — ABNORMAL LOW (ref 150–400)
RDW: 16.8 % — ABNORMAL HIGH (ref 11.5–15.5)
WBC: 4.3 10*3/uL (ref 4.0–10.5)

## 2013-02-17 NOTE — Progress Notes (Signed)
[name] [name] is a 67 yo hypertensive and diabetic male with dilated cardiomyopathy, atrial fibrillation on coumadin, NCIM s/p Bi-V ICD.   Subjective: He feels better today though he is still tired. He had no fever last night. He still has abdominal pain. His shoulder pain is settled now.   Objective: Vital signs in last 24 hours: Filed Vitals:   02/16/13 2229 02/17/13 0610 02/17/13 0642 02/17/13 1005  BP: 112/62 128/71  102/58  Pulse: 108 98  92  Temp: 97.7 F (36.5 C) 97.9 F (36.6 C)  98 F (36.7 C)  TempSrc: Oral Oral  Oral  Resp: 18 18  18   Height:      Weight:   79.1 kg (174 lb 6.1 oz)   SpO2: 92% 95%  96%   Weight change: -3.3 kg (-7 lb 4.4 oz)  Intake/Output Summary (Last 24 hours) at 02/17/13 1328 Last data filed at 02/17/13 0941  Gross per 24 hour  Intake 1894.67 ml  Output   1200 ml  Net 694.67 ml   Physical Exam:  Blood pressure 124/60, pulse 94, temperature 101.4 F (38.6 C), temperature source Oral, resp. rate 20, height 5\' 9"  (1.753 m), weight 82.6 kg (182 lb 1.6 oz), SpO2 95.00%.  General: Patient was somnolent and sleeping during the interview and examination..  Head: Normocephalic and atraumatic  Ear: TM normal bilaterally  Nose: No erythema or drainage noted. Turbinates normal  Mouth: no erythema or exudates, dry tongue and mouth  Eyes: PERRL, EOMI, conjunctivae normal, No scleral icterus.  Neck: Supple with no stiffness, normal ROM, No JVD, mass, thyromegaly, or carotid bruit present.  Cardiovascular: RRR, S1 normal, S2 normal, no MRG, pulses symmetric and intact bilaterally  Pulmonary/Chest: normal respiratory effort, CTAB, no wheezes, rales, or rhonchi  Abdominal: Soft. tenderness in the right upper quadrant more pronounced during deep palpation, murphy sign negative, Neurological: CN II-XII intact, 3-4/5 strength in upper limbs, 5/5 in lower limbs, no focal motor deficit, sensory intact to light touch bilaterally.   Lab Results: Basic Metabolic  Panel:  Recent Labs Lab 02/16/13 0415 02/17/13 0540  NA 146* 148*  K 4.2 4.5  CL 113* 117*  CO2 22 21  GLUCOSE 235* 286*  BUN 27* 31*  CREATININE 1.48* 1.27  CALCIUM 8.1* 8.1*   Liver Function Tests:  Recent Labs Lab 02/13/13 1556 02/14/13 0445  AST 31 59*  ALT 26 44  ALKPHOS 66 74  BILITOT 0.2* 0.3  PROT 6.2 6.5  ALBUMIN 2.7* 3.0*    Recent Labs Lab 02/13/13 1556  LIPASE 16    Recent Labs Lab 02/13/13 1050  AMMONIA 17   CBC:  Recent Labs Lab 02/12/13 1739  02/15/13 0805 02/16/13 0415 02/17/13 0540  WBC 3.1*  < > 1.7* 1.9* 4.3  NEUTROABS 2.7  --  1.6*  --   --   HGB 12.9*  < > 11.8* 13.0 14.2  HCT 38.2*  < > 34.9* 38.9* 42.0  MCV 73.9*  < > 74.1* 75.1* 74.9*  PLT 157  < > 59* 45* 54*  < > = values in this interval not displayed. Cardiac Enzymes:  Recent Labs Lab 02/12/13 1739  TROPONINI <0.30   BNP: No results found for this basename: PROBNP,  in the last 168 hours D-Dimer: No results found for this basename: DDIMER,  in the last 168 hours CBG:  Recent Labs Lab 02/16/13 0703 02/16/13 1146 02/16/13 1749 02/17/13 0039 02/17/13 0600 02/17/13 1127  GLUCAP 204* 214* 255* 294* 252* 305*  Hemoglobin A1C: No results found for this basename: HGBA1C,  in the last 168 hours Fasting Lipid Panel: No results found for this basename: CHOL, HDL, LDLCALC, TRIG, CHOLHDL, LDLDIRECT,  in the last 168 hours Thyroid Function Tests:  Recent Labs Lab 02/13/13 0545  TSH 0.582   Coagulation:  Recent Labs Lab 02/14/13 0445 02/15/13 0805 02/16/13 0415 02/17/13 0540  LABPROT 17.6* 21.1* 19.6* 14.1  INR 1.49 1.90* 1.72* 1.11   Anemia Panel:  Recent Labs Lab 02/13/13 1050  VITAMINB12 838   Urine Drug Screen: Drugs of Abuse  No results found for this basename: labopia, cocainscrnur, labbenz, amphetmu, thcu, labbarb    Alcohol Level: No results found for this basename: ETH,  in the last 168 hours Urinalysis:  Recent Labs Lab  02/12/13 2138 02/13/13 0953  COLORURINE YELLOW YELLOW  LABSPEC 1.024 1.021  PHURINE 5.5 5.5  GLUCOSEU >1000* >1000*  HGBUR NEGATIVE NEGATIVE  BILIRUBINUR NEGATIVE NEGATIVE  KETONESUR NEGATIVE NEGATIVE  PROTEINUR NEGATIVE NEGATIVE  UROBILINOGEN 0.2 1.0  NITRITE NEGATIVE NEGATIVE  LEUKOCYTESUR NEGATIVE NEGATIVE   Misc. Labs:   Micro Results: Recent Results (from the past 240 hour(s))  URINE CULTURE     Status: None   Collection Time    02/12/13  9:39 PM      Result Value Range Status   Specimen Description URINE, CLEAN CATCH   Final   Special Requests NONE   Final   Culture  Setup Time 02/13/2013 03:10   Final   Colony Count NO GROWTH   Final   Culture NO GROWTH   Final   Report Status 02/14/2013 FINAL   Final  CULTURE, BLOOD (ROUTINE X 2)     Status: None   Collection Time    02/13/13 10:50 AM      Result Value Range Status   Specimen Description BLOOD LEFT ARM   Final   Special Requests BOTTLES DRAWN AEROBIC ONLY 10CC   Final   Culture  Setup Time 02/13/2013 17:47   Final   Culture     Final   Value:        BLOOD CULTURE RECEIVED NO GROWTH TO DATE CULTURE WILL BE HELD FOR 5 DAYS BEFORE ISSUING A FINAL NEGATIVE REPORT   Report Status PENDING   Incomplete  CULTURE, BLOOD (ROUTINE X 2)     Status: None   Collection Time    02/13/13 10:58 AM      Result Value Range Status   Specimen Description BLOOD LEFT ARM   Final   Special Requests BOTTLES DRAWN AEROBIC ONLY 10CC   Final   Culture  Setup Time 02/13/2013 17:47   Final   Culture     Final   Value:        BLOOD CULTURE RECEIVED NO GROWTH TO DATE CULTURE WILL BE HELD FOR 5 DAYS BEFORE ISSUING A FINAL NEGATIVE REPORT   Report Status PENDING   Incomplete  CULTURE, BLOOD (ROUTINE X 2)     Status: None   Collection Time    02/15/13  6:55 PM      Result Value Range Status   Specimen Description BLOOD LEFT HAND   Final   Special Requests BOTTLES DRAWN AEROBIC ONLY [facility]   Final   Culture  Setup Time 02/16/2013 04:06    Final   Culture     Final   Value:        BLOOD CULTURE RECEIVED NO GROWTH TO DATE CULTURE WILL BE HELD FOR 5 DAYS BEFORE ISSUING  A FINAL NEGATIVE REPORT   Report Status PENDING   Incomplete  CULTURE, BLOOD (ROUTINE X 2)     Status: None   Collection Time    02/15/13  6:56 PM      Result Value Range Status   Specimen Description BLOOD RIGHT HAND   Final   Special Requests BOTTLES DRAWN AEROBIC ONLY 8CC   Final   Culture  Setup Time 02/16/2013 04:07   Final   Culture     Final   Value:        BLOOD CULTURE RECEIVED NO GROWTH TO DATE CULTURE WILL BE HELD FOR 5 DAYS BEFORE ISSUING A FINAL NEGATIVE REPORT   Report Status PENDING   Incomplete   Studies/Results: No results found. Medications: I have reviewed the patient's current medications. Scheduled Meds: . bisacodyl  10 mg Rectal BID  . carvedilol  6.25 mg Oral BID WC  . docusate sodium  100 mg Oral BID  . doxycycline (VIBRAMYCIN) IV  100 mg Intravenous Q12H  . insulin aspart  0-9 Units Subcutaneous Q6H  . insulin detemir  22 Units Subcutaneous QHS  . metronidazole  500 mg Intravenous Q8H  . sodium chloride  3 mL Intravenous Q12H   Continuous Infusions: . pantoprozole (PROTONIX) infusion 8 mg/hr (02/17/13 0725)   PRN Meds:.acetaminophen, hydrALAZINE, metoprolol, morphine injection, ondansetron (ZOFRAN) IV, promethazine, sodium chloride  Principal Problem:   Weakness generalized Active Problems:   AODM   CARDIOMYOPATHY, PRIMARY, DILATED   LBBB   SYSTOLIC HEART FAILURE, CHRONIC   HTN (hypertension)   Chronic anticoagulation   Atrial fibrillation   Biventricular implantable cardioverter-defibrillator in situ   Fever of undetermined origin   Altered mental status  Assessment/Plan: 67 yo male presented with increasing confusion and frequent falls. He was reported to have a fever of 102 in ED and has been febrile since admission on 06/21.  RMSF:  He has severe leukopenia with WBC count of 1.7, thrombocytopenia with platelets  59, history of bug bite and staying in woods, progressive fatigue and confusion, fever.  -Doxycycline 100 mg IV Q12Hr.  -We recommend to check for RMSF serology, Lyme serology and Ehrlichia.  Encephalitis:  His fever, headaches, confusion and vomiting in hospital also suggests encephalitis.  -We recommend LP after considering his INR and platelets level.  TTP:  He has a fever, confusion, thrombocytopenia, decreasing renal function and increased RDW that can point to the TTP. -We recommend TTP workup with smears, haptoglobin and fibrinogen.  This is a Psychologist, occupational Note.  The care of the patient was discussed with Dr. Judyann Munson and the assessment and plan formulated with their assistance.  Please see their attached note for official documentation of the daily encounter.   LOS: 5 days   Virgina Evener, Med Student 02/17/2013, 1:28 PM

## 2013-02-17 NOTE — Progress Notes (Signed)
Inpatient Diabetes Program Recommendations  AACE/ADA: New Consensus Statement on Inpatient Glycemic Control (2013)  Target Ranges:  Prepandial:   less than 140 mg/dL      Peak postprandial:   less than 180 mg/dL (1-2 hours)      Critically ill patients:  140 - 180 mg/dL  Results for [name] [name], [name] [name] (MRN [id number]) as of 02/17/2013 14:09  Ref. Range 02/16/2013 11:46 02/16/2013 17:49 02/17/2013 00:39 02/17/2013 06:00 02/17/2013 11:27  Glucose-Capillary Latest Range: 70-99 mg/dL 782 (H) 956 (H) 213 (H) 252 (H) 305 (H)   Inpatient Diabetes Program Recommendations Insulin - Basal: Increase Levemir to 25 units  Correction (SSI): change to Q4 hours instead of Q 6 hours Thank you  Piedad Climes BSN, RN,CDE Inpatient Diabetes Coordinator [phone] (team pager)

## 2013-02-17 NOTE — Clinical Social Work Placement (Addendum)
    Clinical Social Work Department CLINICAL SOCIAL WORK PLACEMENT NOTE 02/20/2013  Patient:  [name] [name], [name] [name]  Account Number:  [id number] Admit date:  02/12/2013  Clinical Social Worker:  Santa Genera, CLINICAL SOCIAL WORKER  Date/time:  02/17/2013 04:00 PM  Clinical Social Work is seeking post-discharge placement for this patient at the following level of care:   SKILLED NURSING   (*CSW will update this form in Epic as items are completed)   02/17/2013  Patient/family provided with [facility] Department of Clinical Social Work's list of facilities offering this level of care within the geographic area requested by the patient (or if unable, by the patient's family).  02/17/2013  Patient/family informed of their freedom to choose among providers that offer the needed level of care, that participate in Medicare, Medicaid or managed care program needed by the patient, have an available bed and are willing to accept the patient.  02/17/2013  Patient/family informed of [facility]' ownership interest in [facility], as well as of the fact that they are under no obligation to receive care at this facility.  PASARR submitted to EDS on 02/17/2013 PASARR number received from EDS on 02/17/2013  FL2 transmitted to all facilities in geographic area requested by pt/family on  02/17/2013 FL2 transmitted to all facilities within larger geographic area on   Patient informed that his/her managed care company has contracts with or will negotiate with  certain facilities, including the following:   [facility]- Medicare Complete     Patient/family informed of bed offers received:  02/18/2013 Patient chooses bed at [facility], [place] Physician recommends and patient chooses bed at    [facility] [place] on  02/18/2013 Patient to be transferred to facility by Ambulance  Sharin Mons)  The following physician request were  entered in Epic:   Additional Comments: 02/18/13  OK per MD for d/c to SNF today.  Patient and wife agree to SNF transfer.  Notified SNF and pt's nurse of d/c. No further CSW needs identified.  CSW signing off.  Lorri Frederick. Tejuan Gholson, LCSWA [phone]

## 2013-02-17 NOTE — Clinical Social Work Note (Signed)
CSW met w wife and patient - agreed to SNF bed search, want [facility] which is near their home.  Santa Genera, LCSW Clinical Social Worker [phone])

## 2013-02-17 NOTE — Progress Notes (Signed)
Subjective: No new complaints. No significant overnight adverse clinical events reported.  Objective: Current vital signs: BP 128/71  Pulse 98  Temp(Src) 97.9 F (36.6 C) (Oral)  Resp 18  Ht 5\' 9"  (1.753 m)  Wt 79.1 kg (174 lb 6.1 oz)  BMI 25.74 kg/m2  SpO2 95%  Neurologic Exam: Alert and in no acute distress. Patient is well-oriented to time as well as place. Mental status was normal. Pupils extraocular movements were normal. Face was symmetrical with no signs of focal weakness. Patient was markedly hoarse, but speech was intelligible. Strength of extremities was normal proximally and distally, and symmetrical. Deep tendon reflexes were hypoactive and symmetrical. Sensory exam is normal.  Lab Results: Results for orders placed during the hospital encounter of 02/12/13 (from the past 48 hour(s))  GLUCOSE, CAPILLARY     Status: Abnormal   Collection Time    02/15/13 11:31 AM      Result Value Range   Glucose-Capillary 193 (*) 70 - 99 mg/dL   Comment 1 Notify RN    GLUCOSE, CAPILLARY     Status: Abnormal   Collection Time    02/15/13  4:53 PM      Result Value Range   Glucose-Capillary 232 (*) 70 - 99 mg/dL   Comment 1 Notify RN    GLUCOSE, CAPILLARY     Status: Abnormal   Collection Time    02/15/13 11:11 PM      Result Value Range   Glucose-Capillary 252 (*) 70 - 99 mg/dL   Comment 1 Documented in Chart     Comment 2 Notify RN    CBC     Status: Abnormal   Collection Time    02/16/13  4:15 AM      Result Value Range   WBC 1.9 (*) 4.0 - 10.5 K/uL   RBC 5.18  4.22 - 5.81 MIL/uL   Hemoglobin 13.0  13.0 - 17.0 g/dL   HCT 43.3 (*) 29.5 - 18.8 %   MCV 75.1 (*) 78.0 - 100.0 fL   MCH 25.1 (*) 26.0 - 34.0 pg   MCHC 33.4  30.0 - 36.0 g/dL   RDW 41.6 (*) 60.6 - 30.1 %   Platelets 45 (*) 150 - 400 K/uL   Comment: PLATELET COUNT CONFIRMED BY SMEAR     REPEATED TO VERIFY     SPECIMEN CHECKED FOR CLOTS  BASIC METABOLIC PANEL     Status: Abnormal   Collection Time   02/16/13  4:15 AM      Result Value Range   Sodium 146 (*) 135 - 145 mEq/L   Potassium 4.2  3.5 - 5.1 mEq/L   Chloride 113 (*) 96 - 112 mEq/L   CO2 22  19 - 32 mEq/L   Glucose, Bld 235 (*) 70 - 99 mg/dL   BUN 27 (*) 6 - 23 mg/dL   Creatinine, Ser 6.01 (*) 0.50 - 1.35 mg/dL   Calcium 8.1 (*) 8.4 - 10.5 mg/dL   GFR calc non Af Amer 47 (*) >90 mL/min   GFR calc Af Amer 54 (*) >90 mL/min   Comment:            The eGFR has been calculated     using the CKD EPI equation.     This calculation has not been     validated in all clinical     situations.     eGFR's persistently     <90 mL/min signify     possible Chronic  Kidney Disease.  PROTIME-INR     Status: Abnormal   Collection Time    02/16/13  4:15 AM      Result Value Range   Prothrombin Time 19.6 (*) 11.6 - 15.2 seconds   INR 1.72 (*) 0.00 - 1.49  GLUCOSE, CAPILLARY     Status: Abnormal   Collection Time    02/16/13  7:03 AM      Result Value Range   Glucose-Capillary 204 (*) 70 - 99 mg/dL   Comment 1 Documented in Chart     Comment 2 Notify RN    GLUCOSE, CAPILLARY     Status: Abnormal   Collection Time    02/16/13 11:46 AM      Result Value Range   Glucose-Capillary 214 (*) 70 - 99 mg/dL   Comment 1 Notify RN    GLUCOSE, CAPILLARY     Status: Abnormal   Collection Time    02/16/13  5:49 PM      Result Value Range   Glucose-Capillary 255 (*) 70 - 99 mg/dL   Comment 1 Documented in Chart     Comment 2 Notify RN    GLUCOSE, CAPILLARY     Status: Abnormal   Collection Time    02/17/13 12:39 AM      Result Value Range   Glucose-Capillary 294 (*) 70 - 99 mg/dL  CBC     Status: Abnormal   Collection Time    02/17/13  5:40 AM      Result Value Range   WBC 4.3  4.0 - 10.5 K/uL   RBC 5.61  4.22 - 5.81 MIL/uL   Hemoglobin 14.2  13.0 - 17.0 g/dL   HCT 29.5  62.1 - 30.8 %   MCV 74.9 (*) 78.0 - 100.0 fL   MCH 25.3 (*) 26.0 - 34.0 pg   MCHC 33.8  30.0 - 36.0 g/dL   RDW 65.7 (*) 84.6 - 96.2 %   Platelets 54 (*) 150 -  400 K/uL   Comment: CONSISTENT WITH PREVIOUS RESULT  BASIC METABOLIC PANEL     Status: Abnormal   Collection Time    02/17/13  5:40 AM      Result Value Range   Sodium 148 (*) 135 - 145 mEq/L   Potassium 4.5  3.5 - 5.1 mEq/L   Chloride 117 (*) 96 - 112 mEq/L   CO2 21  19 - 32 mEq/L   Glucose, Bld 286 (*) 70 - 99 mg/dL   BUN 31 (*) 6 - 23 mg/dL   Creatinine, Ser 9.52  0.50 - 1.35 mg/dL   Calcium 8.1 (*) 8.4 - 10.5 mg/dL   GFR calc non Af Amer 56 (*) >90 mL/min   GFR calc Af Amer 65 (*) >90 mL/min   Comment:            The eGFR has been calculated     using the CKD EPI equation.     This calculation has not been     validated in all clinical     situations.     eGFR's persistently     <90 mL/min signify     possible Chronic Kidney Disease.  PROTIME-INR     Status: None   Collection Time    02/17/13  5:40 AM      Result Value Range   Prothrombin Time 14.1  11.6 - 15.2 seconds   INR 1.11  0.00 - 1.49  GLUCOSE, CAPILLARY     Status: Abnormal  Collection Time    02/17/13  6:00 AM      Result Value Range   Glucose-Capillary 252 (*) 70 - 99 mg/dL  FIBRINOGEN     Status: None   Collection Time    02/17/13  8:00 AM      Result Value Range   Fibrinogen 440  204 - 475 mg/dL    Studies/Results: No results found.  Medications:  I have reviewed the patient's current medications. Scheduled: . bisacodyl  10 mg Rectal BID  . carvedilol  6.25 mg Oral BID WC  . docusate sodium  100 mg Oral BID  . doxycycline (VIBRAMYCIN) IV  100 mg Intravenous Q12H  . insulin aspart  0-9 Units Subcutaneous Q6H  . insulin detemir  22 Units Subcutaneous QHS  . metronidazole  500 mg Intravenous Q8H  . sodium chloride  3 mL Intravenous Q12H   Continuous: . pantoprozole (PROTONIX) infusion 8 mg/hr (02/17/13 0725)   AVW:[id number], hydrALAZINE, metoprolol, morphine injection, ondansetron (ZOFRAN) IV, promethazine, sodium chloride  Assessment/Plan: Acute mental status changes with  exacerbation of cognitive functions most likely related to febrile illness, which is improved. Patient's mental status is back to baseline. He is well-oriented at this point and having no significant cognitive deficits currently. He has no signs of acute CNS lesion.  No further neurological intervention is indicated at this point. We will remain available, however, for reevaluation if clinically indicated.  C.R. Roseanne Reno, MD [facility] [phone]  02/17/2013  9:30 AM

## 2013-02-17 NOTE — Progress Notes (Signed)
Occupational Therapy Treatment Patient Details Name: [name] [name] MRN: [id number] DOB: [birth year] Today's Date: 02/17/2013 Time: 9562-1308 OT Time Calculation (min): 24 min  OT Assessment / Plan / Recommendation  OT comments  This patient making progress but needs to be at S level or better to go home with wife, thus would benefit from inpatient rehab  Follow Up Recommendations  CIR       Equipment Recommendations  3 in 1 bedside comode;Tub/shower bench       Frequency Min 2X/week   Progress towards OT Goals Progress towards OT goals: Progressing toward goals  Plan Discharge plan needs to be updated    Precautions / Restrictions Precautions Precautions: Fall Precaution Comments: recurrent falls ?  Restrictions Weight Bearing Restrictions: No       ADL  Grooming: Performed;Wash/dry face;Minimal assistance (for balance) Where Assessed - Grooming: Supported standing Toilet Transfer: Simulated;Moderate assistance Toilet Transfer Method: Sit to Barista:  (Bed>out door and down hall with recliner behind him) Equipment Used: Gait belt;Rolling walker Transfers/Ambulation Related to ADLs: Mod A sit to stand and varried between Min A and Mod A for ambulation with RW (needing at times A with steering it and continuing to push it)      OT Goals(current goals can now be found in the care plan section)    Visit Information  Last OT Received On: 02/17/13 Assistance Needed: +2 (to follow with chair) History of Present Illness: [name] [name] is an 67 y.o. male with hx of DM, dilated CMP, HTN, afib on Coumadin, acute on chronic diastolic CHF, s/p ICD, dementia with progressive decline over the past year, frequent falls at home, brought to the ER because of generalized weakness, and having another fall. In the ER, he was found to have a fever with Tmax of 102.  Evaluation included a head CT which showed no acute processes, a negative UA, and a negative  CXR for any evidence of infection.  His WBC is 3.1K and lactic acid was normal, normal platelet count, and Cr of 1.2.  His Na is 129 and he appears only slightly volume depleted clinically.  Hospitialist was asked to admit him for generalized weakness and fever of undetermined source. Being treated for possible [facility] Spotted Fever          Cognition  Cognition Arousal/Alertness: Awake/alert Behavior During Therapy: Flat affect Overall Cognitive Status: Impaired/Different from baseline Area of Impairment: Attention;Following commands;Safety/judgement Current Attention Level: Sustained Following Commands: Follows one step commands inconsistently;Follows one step commands with increased time Safety/Judgement: Decreased awareness of safety (with RW use) Problem Solving: Slow processing;Decreased initiation    Mobility  Bed Mobility Bed Mobility: Supine to Sit;Sitting - Scoot to Edge of Bed Supine to Sit: 3: Mod assist (with increased time and Mod VCs for technique) Sitting - Scoot to Edge of Bed: 4: Min assist Transfers Transfers: Sit to Stand;Stand to Sit Sit to Stand: 3: Mod assist;With upper extremity assist;From bed Stand to Sit: 4: Min guard;With upper extremity assist;With armrests;To toilet Details for Transfer Assistance: Max VCs for safe hand placement          End of Session OT - End of Session Equipment Utilized During Treatment: Gait belt Activity Tolerance: Patient tolerated treatment well Patient left: in chair;with call bell/phone within reach;with family/visitor present (wife)       [name] [name] [phone] 02/17/2013, 1:46 PM

## 2013-02-17 NOTE — Progress Notes (Signed)
Discussed pt w/ onsite reviewer for pt's insurance.  Pt's insurance will not cover CIR for his diagnosis-will cover SNF.  Pt's CM, Camellia, notified.  [phone]

## 2013-02-17 NOTE — Progress Notes (Signed)
[facility]    Date of Admission:  02/12/2013   Total days of antibiotics 3        Day 3 doxy        Day 3 metronidazole           ID: [name] [name] is a 67 y.o. male  presented with increasing confusion and frequent falls. He was reported to have a fever of 102 in ED and has been febrile since admission on 06/21. C/w FUO plus vague abdominal complaints with negative work up  Principal Problem:   Weakness generalized Active Problems:   AODM   CARDIOMYOPATHY, PRIMARY, DILATED   LBBB   SYSTOLIC HEART FAILURE, CHRONIC   HTN (hypertension)   Chronic anticoagulation   Atrial fibrillation   Biventricular implantable cardioverter-defibrillator in situ   Fever of undetermined origin   Altered mental status    Subjective: Afebrile. Denies complaints  Medications:  . bisacodyl  10 mg Rectal BID  . carvedilol  6.25 mg Oral BID WC  . docusate sodium  100 mg Oral BID  . doxycycline (VIBRAMYCIN) IV  100 mg Intravenous Q12H  . insulin aspart  0-9 Units Subcutaneous Q6H  . insulin detemir  22 Units Subcutaneous QHS  . metronidazole  500 mg Intravenous Q8H  . sodium chloride  3 mL Intravenous Q12H    Objective: Vital signs in last 24 hours: Temp:  [97.7 F (36.5 C)-98.7 F (37.1 C)] 98 F (36.7 C) (06/26 1005) Pulse Rate:  [92-108] 92 (06/26 1005) Resp:  [18-19] 18 (06/26 1005) BP: (102-128)/(58-71) 102/58 mmHg (06/26 1005) SpO2:  [92 %-97 %] 96 % (06/26 1005) Weight:  [174 lb 6.1 oz (79.1 kg)] 174 lb 6.1 oz (79.1 kg) (06/26 1610) GEN: Patient sleeping in chair, easily arousable PSYCH: alert and oriented to self;in NAD HEENT: Mucous membranes pink and anicteric; PERRLA; EOM intact; no cervical lymphadenopathy nor thyromegaly or carotid bruit; no JVD; There were no stridor. Neck is very supple.  CHEST WALL: No tenderness  CHEST: Normal respiration, clear to auscultation bilaterally.  HEART: Regular rate and rhythm. There are no murmur, rub, or  gallops.  BACK: No kyphosis or scoliosis; no CVA tenderness  ABDOMEN: soft and non-tender; no masses, no organomegaly, normal abdominal bowel sounds; no pannus; no intertriginous candida. There is no rebound and no distention.  EXTREMITIES: No bone or joint deformity; age-appropriate arthropathy of the hands and knees; no edema; no ulcerations. There is no calf tenderness.  PULSES: 2+ and symmetric  SKIN: Normal hydration no rash or ulceration     Lab Results  Recent Labs  02/16/13 0415 02/17/13 0540  WBC 1.9* 4.3  HGB 13.0 14.2  HCT 38.9* 42.0  NA 146* 148*  K 4.2 4.5  CL 113* 117*  CO2 22 21  BUN 27* 31*  CREATININE 1.48* 1.27   Liver Panel No results found for this basename: PROT, ALBUMIN, AST, ALT, ALKPHOS, BILITOT, BILIDIR, IBILI,  in the last 72 hours  Microbiology: 6/21 urine cx NGTD 6/22 blood cx NGTD 6/24 blood cx NGTD  B. burdorferi negative  rpr negative RMSF IgG negative  Studies/Results: No results found.   Assessment/Plan: 67yo M with DM, NICM presents with FUO, weakness and confusion. Concern for developing leukopenia, thrombocytopenia with high fevers x 4 days, etiology for presentation is presently unknown although outdoor insect bite risk factor is concerning for tickborne illness. Improving on doxycycline  - continue with iv doxycycline 100mg  IV Q12hr, can switch  to oral once ready to discharge to treat for 10 day course - will  Follow up for RMSF, erhlichia ab; negative for lyme disease  - encephalopathy improving, no need for LP - leukopenia improving.  Will sign off   Drue Second [facility] Cell: [phone] Pager: [phone]  02/17/2013, 2:12 PM

## 2013-02-17 NOTE — Progress Notes (Signed)
Patient ID: [name] [name], male   DOB: [birth year], 67 y.o.   MRN: [id number] Subjective:  Feeling better, denies sob  Objective:  Vital Signs in the last 24 hours: Temp:  [97.7 F (36.5 C)-98.7 F (37.1 C)] 97.9 F (36.6 C) (06/26 0610) Pulse Rate:  [98-108] 98 (06/26 0610) Resp:  [18-20] 18 (06/26 0610) BP: (102-128)/(59-71) 128/71 mmHg (06/26 0610) SpO2:  [91 %-97 %] 95 % (06/26 0610) Weight:  [174 lb 6.1 oz (79.1 kg)] 174 lb 6.1 oz (79.1 kg) (06/26 4098)  Intake/Output from previous day: 06/25 0701 - 06/26 0700 In: 2114.7 [P.O.:120; I.V.:944.7; IV Piggyback:1050] Out: 1250 [Urine:1250] Intake/Output from this shift: Total I/O In: 0  Out: 250 [Urine:250]  Physical Exam: stable appearing 67 yo man, NAD HEENT: Unremarkable Neck:  No JVD, no thyromegally Lungs:  Clear with minimal basilar rales HEART:  Regular rate rhythm, no murmurs, no rubs, no clicks Abd:  soft, positive bowel sounds, no organomegally, no rebound, no guarding Ext:  2 plus pulses, no edema, no cyanosis, no clubbing Skin:  No rashes no nodules Neuro:  CN II through XII intact, motor grossly intact  Lab Results:  Recent Labs  02/16/13 0415 02/17/13 0540  WBC 1.9* 4.3  HGB 13.0 14.2  PLT 45* 54*    Recent Labs  02/16/13 0415 02/17/13 0540  NA 146* 148*  K 4.2 4.5  CL 113* 117*  CO2 22 21  GLUCOSE 235* 286*  BUN 27* 31*  CREATININE 1.48* 1.27   No results found for this basename: TROPONINI, CK, MB,  in the last 72 hours Hepatic Function Panel No results found for this basename: PROT, ALBUMIN, AST, ALT, ALKPHOS, BILITOT, BILIDIR, IBILI,  in the last 72 hours No results found for this basename: CHOL,  in the last 72 hours No results found for this basename: PROTIME,  in the last 72 hours  Imaging: No results found.  Cardiac Studies: Tele - BiV pacing Assessment/Plan:  1. Acute febrile illness with AMS, now improved after antibiotics 2. Chronic atrial fib 3. Non-ischemic CM 4.  Chronic systolic CHF, s/p BiV ICD Rec: continue supportive care. Agree with holding on LP. His volume status is stable. He can have his CHF meds re-initiated once he is taking po's and blood pressure stable.  LOS: 5 days    Tamberly Pomplun,M.D. 02/17/2013, 10:12 AM

## 2013-02-17 NOTE — Progress Notes (Signed)
[facility] PROGRESS NOTE  [name] [name] UVO:[id number] DOB: 1945/11/25 DOA: 02/12/2013 PCP: [name], MD  Assessment/Plan: Fever of unknown origin -Last temperature greater than 38.0C on 02/15/2013@1900  -Await results of tickborne serologies -Continue intravenous doxycycline, started 02/15/2013 -Appears to have defervescence since institution of doxycycline -HIDA scan negative -CT abdomen shows gallbladder sludge and benign splenule -Appreciate ID followup -Blood cultures remain negative -discussed with Dr. Drue Second today--given clinical improvement, can hold off on LP, but can reassess need if worsens again and if fever returns -consider d/c Flagyl Delirium in the setting of dementia -Ammonia, TSH, B12, ABG, RPR negative -Appears more lucid today Generalized weakness/deconditioning -Continue PT -May need skilled nursing facility placement History of atrial fibrillation, left bundle branch block -Appreciate cardiology recommendations -Will not restart Coumadin at this time -Plan for aspirin after any further invasive procedures Pancytopenia -may be due to tick borne infection -WBC and platelets showing signs of recovery -Continue doxycycline -Check fibrinogen, haptoglobin, peripheral smear, HIT panel Chronic systolic and diastolic CHF -EF 20 -25% -Appears euvolemic at this time Abdominal pain -Continue Protonix drip per GI -appreciate Dr. Madilyn Fireman -Continues to complain of intermittent epigastric discomfort with one episode of emesis last night (6/25) -advance diet if okay with GI Hypertension -Titrate carvedilol as the pressure tolerates -Restart ACE inhibitor if tolerated Diabetes mellitus type 2 -Restart Januvia -Continue Levemir 20 units at bedtime -We'll not restart Avandia given the patient's history of CHF and NICM Family Communication:   Pt at beside Disposition Plan:   Home when medically stable  Antibiotics:  Doxycycline  02/15/2013>>>  Flagyl 02/14/2013>>>         Procedures/Studies: Dg Chest 2 View  02/12/2013   *RADIOLOGY REPORT*  Clinical Data: Weakness, fall, numbness in feet, headache, nausea, history diabetes, hypertension, nonischemic cardiomyopathy  CHEST - 2 VIEW  Comparison: 12/22/2003  Findings: Left subclavian pacemaker / AICD leads project at right atrium, right ankle, and coronary sinus. Upper normal heart size. Normal mediastinal contours and pulmonary vascularity for AP technique. Lungs appear hyperinflated without infiltrate, pleural effusion or pneumothorax. Minimal peribronchial thickening. No acute osseous findings.  IMPRESSION: Post AICD. Bronchitic changes and hyperinflation without acute infiltrate.   Original Report Authenticated By: Ulyses Southward, M.D.   Ct Head Wo Contrast  02/12/2013   *RADIOLOGY REPORT*  Clinical Data: Frequent falls with unsteady gait and confusion.  CT HEAD WITHOUT CONTRAST  Technique:  Contiguous axial images were obtained from the base of the skull through the vertex without contrast.  Comparison: None.  Findings: Mild to moderate atrophy.  Chronic microvascular ischemic change affects the periventricular and subcortical white matter. No acute stroke or hemorrhage.  No mass lesion or hydrocephalus. No extra-axial fluid.  Small calcifications in the mid pons, likely dystrophic from a small vascular malformation or remote infarction. No evidence for pontine mass.  Advanced vascular calcification. Calvarium intact.  Clear sinuses and mastoids.  IMPRESSION: Mild to moderate atrophy and chronic microvascular ischemic change. No acute intracranial findings.   Original Report Authenticated By: Davonna Belling, M.D.   Nm Hepatobiliary  02/14/2013   *RADIOLOGY REPORT*  Clinical Data:  Question cholecystitis.  Abdominal pain.  NUCLEAR MEDICINE HEPATOBILIARY IMAGING  Technique:  Sequential images of the abdomen were obtained out to 60 minutes following intravenous administration of  radiopharmaceutical.  Radiopharmaceutical:  5.49mCi Tc-58m Choletec  Comparison:  CT 10/16/2012  Findings: There is homogeneous accumulation radiotracer within the liver and rapid clears the blood pool.  The gallbladder begins to fill at 20 minutes continues to  fill 45 inches.  Counts are evident within the duodenum by 45 minutes.  IMPRESSION: Patent cystic duct and common bile duct.  No evidence of cholecystitis.   Original Report Authenticated By: Genevive Bi, M.D.   Ct Abdomen Pelvis W Contrast  02/13/2013   *RADIOLOGY REPORT*  Clinical Data: Fever, nausea, and vomiting  CT ABDOMEN AND PELVIS WITH CONTRAST  Technique:  Multidetector CT imaging of the abdomen and pelvis was performed following the standard protocol during bolus administration of intravenous contrast.  Contrast: OMNIPAQUE IOHEXOL 300 MG/ML  SOLN  Comparison: 03/10/2012  Findings: There is atelectasis noted in both lung bases.  No pericardial or pleural effusion.  No focal liver abnormalities identified.  Sludge versus tiny stones noted within the dependent portion of the gallbladder.  The pancreas is unremarkable.  There is a enhancing nodule in the distal tip of tail of pancreas this measures 1.3 cm and is unchanged from previous exam.  There are several small splenules adjacent to the inferior aspect of the spleen.  The adrenal glands both appear normal.  The right kidney is normal. The left kidney is normal.  No obstructive uropathy noted.  Urinary bladder is unremarkable.  Abdominal aorta appears normal in caliber.  No aneurysm.  There is mild calcified atherosclerotic disease identified.  No upper abdominal adenopathy identified.  There is no pelvic or inguinal adenopathy.  The stomach appears normal.  The small bowel loops are within normal limits and caliber.  No evidence for obstruction.  The colon appears normal.  There is no free fluid or abnormal fluid collections identified within the abdomen or pelvis.  There is a  periumbilical hernia which contains fat only.  Review of the visualized osseous structures is significant for mild lumbar degenerative disc disease.  IMPRESSION:  1.  No acute findings identified within the abdomen or pelvis. 2.  Sludge versus tiny stones within the dependent portion of the gallbladder. 3.  Soft tissue attenuating enhancing nodule within the tip of the tail of pancreas.  This is unchanged in size from 03/10/2012 and is favored to represent a benign splenule. Presence of benign splenule within the tail of pancreas can be confirmed with heat damage red blood cell nuclear medicine scan.   Original Report Authenticated By: Signa Kell, M.D.   Dg Chest Port 1 View  02/13/2013   *RADIOLOGY REPORT*  Clinical Data: Abdominal pain.  Nausea and vomiting.  Fever and chest pain.  PORTABLE CHEST - 1 VIEW  Comparison: 02/12/2013  Findings: Heart size and pulmonary vascularity are normal and the lungs are clear.  AICD in place.  No acute osseous abnormality.  IMPRESSION: No acute abnormality.   Original Report Authenticated By: Francene Boyers, M.D.   Dg Abd Portable 2v  02/13/2013   *RADIOLOGY REPORT*  Clinical Data: Abdominal pain.  PORTABLE ABDOMEN - 2 VIEW  Comparison: Scout image for CT scan dated 03/10/2012  Findings: The bowel gas pattern is normal.  No free air or free fluid.  No osseous abnormality.  IMPRESSION: Benign-appearing abdomen.   Original Report Authenticated By: Francene Boyers, M.D.         Subjective: Patient appears more lucid today. However he remains pleasantly confused. Denies any fevers, chills, chest pain, shortness breath, dysuria, hematuria. He complains of bifrontal headache which is better than it was in the previous days. Continues to complain of epigastric discomfort with one episode of emesis last night.  Objective: Filed Vitals:   02/16/13 1659 02/16/13 2229 02/17/13 1610 02/17/13 9604  BP: 120/66 112/62 128/71   Pulse: 105 108 98   Temp:  97.7 F (36.5 C)  97.9 F (36.6 C)   TempSrc:  Oral Oral   Resp:  18 18   Height:      Weight:    79.1 kg (174 lb 6.1 oz)  SpO2:  92% 95%     Intake/Output Summary (Last 24 hours) at 02/17/13 0831 Last data filed at 02/17/13 9147  Gross per 24 hour  Intake 2114.67 ml  Output   1250 ml  Net 864.67 ml   Weight change: -3.3 kg (-7 lb 4.4 oz) Exam:   General:  Pt is alert, follows commands appropriately, not in acute distress  HEENT: No icterus, No thrush,  [place]/AT  Cardiovascular: irregular, S1/S2, no rubs, no gallops  Respiratory: Basilar crackles left greater than right. No wheezes. Good air movement.  Abdomen: Soft/+BS,, non distended, no guarding, epigastric tenderness  Extremities: No edema, No lymphangitis, No petechiae, No rashes, no synovitis  Data Reviewed: Basic Metabolic Panel:  Recent Labs Lab 02/13/13 1556 02/14/13 0445 02/15/13 0805 02/16/13 0415 02/17/13 0540  NA 135 135 142 146* 148*  K 3.9 3.9 4.0 4.2 4.5  CL 101 101 110 113* 117*  CO2 23 21 22 22 21   GLUCOSE 175* 291* 207* 235* 286*  BUN 19 22 22  27* 31*  CREATININE 1.31 1.28 1.35 1.48* 1.27  CALCIUM 8.3* 8.5 7.9* 8.1* 8.1*   Liver Function Tests:  Recent Labs Lab 02/12/13 1739 02/13/13 1556 02/14/13 0445  AST 21 31 59*  ALT 25 26 44  ALKPHOS 101 66 74  BILITOT 0.2* 0.2* 0.3  PROT 7.5 6.2 6.5  ALBUMIN 3.7 2.7* 3.0*    Recent Labs Lab 02/13/13 1556  LIPASE 16    Recent Labs Lab 02/13/13 1050  AMMONIA 17   CBC:  Recent Labs Lab 02/12/13 1739  02/13/13 1556 02/14/13 0445 02/15/13 0805 02/16/13 0415 02/17/13 0540  WBC 3.1*  --  4.0 3.6* 1.7* 1.9* 4.3  NEUTROABS 2.7  --   --   --  1.6*  --   --   HGB 12.9*  < > 11.1* 11.5* 11.8* 13.0 14.2  HCT 38.2*  < > 33.1* 34.8* 34.9* 38.9* 42.0  MCV 73.9*  --  74.0* 74.7* 74.1* 75.1* 74.9*  PLT 157  --  112* 98* 59* 45* 54*  < > = values in this interval not displayed. Cardiac Enzymes:  Recent Labs Lab 02/12/13 1739  TROPONINI <0.30    BNP: No components found with this basename: POCBNP,  CBG:  Recent Labs Lab 02/16/13 0703 02/16/13 1146 02/16/13 1749 02/17/13 0039 02/17/13 0600  GLUCAP 204* 214* 255* 294* 252*    Recent Results (from the past 240 hour(s))  URINE CULTURE     Status: None   Collection Time    02/12/13  9:39 PM      Result Value Range Status   Specimen Description URINE, CLEAN CATCH   Final   Special Requests NONE   Final   Culture  Setup Time 02/13/2013 03:10   Final   Colony Count NO GROWTH   Final   Culture NO GROWTH   Final   Report Status 02/14/2013 FINAL   Final  CULTURE, BLOOD (ROUTINE X 2)     Status: None   Collection Time    02/13/13 10:50 AM      Result Value Range Status   Specimen Description BLOOD LEFT ARM   Final   Special  Requests BOTTLES DRAWN AEROBIC ONLY 10CC   Final   Culture  Setup Time 02/13/2013 17:47   Final   Culture     Final   Value:        BLOOD CULTURE RECEIVED NO GROWTH TO DATE CULTURE WILL BE HELD FOR 5 DAYS BEFORE ISSUING A FINAL NEGATIVE REPORT   Report Status PENDING   Incomplete  CULTURE, BLOOD (ROUTINE X 2)     Status: None   Collection Time    02/13/13 10:58 AM      Result Value Range Status   Specimen Description BLOOD LEFT ARM   Final   Special Requests BOTTLES DRAWN AEROBIC ONLY 10CC   Final   Culture  Setup Time 02/13/2013 17:47   Final   Culture     Final   Value:        BLOOD CULTURE RECEIVED NO GROWTH TO DATE CULTURE WILL BE HELD FOR 5 DAYS BEFORE ISSUING A FINAL NEGATIVE REPORT   Report Status PENDING   Incomplete     Scheduled Meds: . bisacodyl  10 mg Rectal BID  . carvedilol  6.25 mg Oral BID WC  . docusate sodium  100 mg Oral BID  . doxycycline (VIBRAMYCIN) IV  100 mg Intravenous Q12H  . insulin aspart  0-9 Units Subcutaneous Q6H  . insulin detemir  22 Units Subcutaneous QHS  . metronidazole  500 mg Intravenous Q8H  . sodium chloride  3 mL Intravenous Q12H   Continuous Infusions: . pantoprozole (PROTONIX) infusion 8 mg/hr  (02/17/13 0725)     Lakeisa Heninger, DO  [facility] Pager [phone]  If 7PM-7AM, please contact night-coverage [email or web address] Password [facility] 02/17/2013, 8:31 AM   LOS: 5 days

## 2013-02-18 LAB — BASIC METABOLIC PANEL
Calcium: 8.1 mg/dL — ABNORMAL LOW (ref 8.4–10.5)
GFR calc Af Amer: 57 mL/min — ABNORMAL LOW (ref >90)
GFR calc non Af Amer: 49 mL/min — ABNORMAL LOW (ref >90)
Sodium: 149 mEq/L — ABNORMAL HIGH (ref 135–145)

## 2013-02-18 LAB — CBC
MCH: 25.2 pg — ABNORMAL LOW (ref 26.0–34.0)
MCHC: 33.7 g/dL (ref 30.0–36.0)
Platelets: 65 10*3/uL — ABNORMAL LOW (ref 150–400)
RDW: 16.9 % — ABNORMAL HIGH (ref 11.5–15.5)

## 2013-02-18 LAB — GLUCOSE, CAPILLARY: Glucose-Capillary: 285 mg/dL — ABNORMAL HIGH (ref 70–99)

## 2013-02-18 MED ORDER — CARVEDILOL 12.5 MG PO TABS
12.5000 mg | ORAL_TABLET | Freq: Two times a day (BID) | ORAL | Status: DC
  Filled 2013-02-18 (×2): qty 1

## 2013-02-18 MED ORDER — CARVEDILOL 6.25 MG PO TABS: 6.2500 mg | ORAL_TABLET | Freq: Two times a day (BID) | ORAL | Status: DC

## 2013-02-18 MED ORDER — DOXYCYCLINE HYCLATE 100 MG PO TABS: 100.0000 mg | ORAL_TABLET | Freq: Two times a day (BID) | ORAL | Status: DC

## 2013-02-18 MED ORDER — DOXYCYCLINE HYCLATE 100 MG PO TABS
100.0000 mg | ORAL_TABLET | Freq: Two times a day (BID) | ORAL | Status: DC
  Administered 2013-02-18: 100 mg via ORAL
  Filled 2013-02-18 (×2): qty 1

## 2013-02-18 MED ORDER — GLIPIZIDE 10 MG PO TABS: 10.0000 mg | ORAL_TABLET | Freq: Every day | ORAL | Status: DC

## 2013-02-18 MED ORDER — INSULIN DETEMIR 100 UNIT/ML ~~LOC~~ SOLN
25.0000 [IU] | Freq: Every day | SUBCUTANEOUS | Status: DC
  Filled 2013-02-18: qty 0.25

## 2013-02-18 MED ORDER — CARVEDILOL 6.25 MG PO TABS
6.2500 mg | ORAL_TABLET | Freq: Two times a day (BID) | ORAL | Status: DC
  Filled 2013-02-18 (×2): qty 1

## 2013-02-18 MED ORDER — INSULIN DETEMIR 100 UNIT/ML ~~LOC~~ SOLN: 25.0000 [IU] | Freq: Every day | SUBCUTANEOUS | Status: DC

## 2013-02-18 NOTE — Progress Notes (Addendum)
Physical Therapy Treatment Patient Details Name: [name] [name] MRN: [id number] DOB: 01-May-1946 Today's Date: 02/18/2013 Time: 4782-9562 PT Time Calculation (min): 30 min  PT Assessment / Plan / Recommendation  PT Comments   Patient is currently very weak and with minimal assist for sit<>stand transfers. Pt was able to ambulate for increased distance today with min assist for stability. He requires mod VC to use the RW properly and tends to lift the RW.  Pt's insurance will not cover CIR therefore pt will benefit from continued PT in SNF in order to increase strength and functional independence.  D/c plans updated at this time.     Follow Up Recommendations  SNF     Does the patient have the potential to tolerate intense rehabilitation     Barriers to Discharge        Equipment Recommendations  Rolling walker with 5" wheels    Recommendations for Other Services Rehab consult  Frequency Min 4X/week   Progress towards PT Goals Progress towards PT goals: Progressing toward goals  Plan Discharge plan needs to be updated;Frequency needs to be updated    Precautions / Restrictions Precautions Precautions: Fall Precaution Comments: recurrent falls ?  Restrictions Weight Bearing Restrictions: No   Pertinent Vitals/Pain Patient made no c/o pain.    Mobility  Bed Mobility Bed Mobility: Supine to Sit;Sitting - Scoot to Edge of Bed Supine to Sit: 3: Mod assist Sitting - Scoot to Edge of Bed: 4: Min assist Details for Bed Mobility Assistance: pt required max verbal and tactile cues for sequencing pt did initiate moving LEs to EOB.  Increased time & effortful for pt.   Transfers Transfers: Sit to Stand;Stand to Sit Sit to Stand: 1: +2 Total assist;From bed;From chair/3-in-1;With upper extremity assist;With armrests Stand to Sit: 3: Mod assist;With upper extremity assist;With armrests;To chair/3-in-1 Details for Transfer Assistance: Max VCs for safe hand placement and use of UE to  help stand. Ambulation/Gait Ambulation/Gait Assistance: 4: Min assist Ambulation Distance (Feet): 30 Feet Assistive device: Rolling walker Ambulation/Gait Assistance Details: Pt required frequent VC for proper use of walker. Pt puts too much weight through UE that he is unable to advance RW, forcing him to lift walker and become unsteady.  Gait Pattern: Step-to pattern;Shuffle;Ataxic Gait velocity: slow Stairs: No Wheelchair Mobility Wheelchair Mobility: No    Exercises     :     PT Goals (current goals can now be found in the care plan section)    Visit Information  Last PT Received On: 02/18/13 Assistance Needed: +2 History of Present Illness: [name] [name] is an 67 y.o. male with hx of DM, dilated CMP, HTN, afib on Coumadin, acute on chronic diastolic CHF, s/p ICD, dementia with progressive decline over the past year, frequent falls at home, brought to the ER because of generalized weakness, and having another fall. In the ER, he was found to have a fever with Tmax of 102.  Evaluation included a head CT which showed no acute processes, a negative UA, and a negative CXR for any evidence of infection.  His WBC is 3.1K and lactic acid was normal, normal platelet count, and Cr of 1.2.  His Na is 129 and he appears only slightly volume depleted clinically.  Hospitialist was asked to admit him for generalized weakness and fever of undetermined source. Being treated for possible [facility] Spotted Fever    Subjective Data  Subjective: Pt sitting in bed, agreeable to PT   Cognition  Cognition Arousal/Alertness:  Awake/alert Behavior During Therapy: Flat affect    Balance     End of Session PT - End of Session Equipment Utilized During Treatment: Gait belt Activity Tolerance: Patient limited by fatigue;Patient limited by lethargy Patient left: in chair;with call bell/phone within reach Nurse Communication: Mobility status   GP     Jolyn Nap, SPTA 02/18/2013,  11:46 AM     Agree with above Verdell Face, PTA [phone] 02/18/2013

## 2013-02-18 NOTE — Progress Notes (Signed)
Agree with updated d/c plans.  Mathew Storck, PT DPT [phone]  

## 2013-02-18 NOTE — Progress Notes (Signed)
Pt maintained npo except sips of water . No nausea nor vomiting observed.tele v-paced. No complaints offered

## 2013-02-18 NOTE — Progress Notes (Signed)
Patient Name: [name] [name]      SUBJECTIVE:admited with fever AMS and seen by ID   Feels much better  [name] [name] AF NICM and CRTD  Past Medical History  Diagnosis Date  . Diabetes mellitus   . GERD (gastroesophageal reflux disease)   . Obesity   . Cardiomyopathy, nonischemic     a. Cath 2003: mild nonobstructive CAD, EF 25% at that time.  . Chronic anticoagulation   . Hypertension   . ICD (implantable cardiac defibrillator) in place     a. 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  . Chronic systolic CHF (congestive heart failure)     a. NICM EF 25% dating back to at least 2003.  [name] A-fib     a. Noted on ICD interrogation 2012.  [name] NSVT (nonsustained ventricular tachycardia)     a. Noted on ICD interrogation in 2011.  [name] LBBB (left bundle branch block)   . CAD (coronary artery disease)     a. Nonobstructive by cath 09/2001.  [name] Kidney stone   . Dementia     Scheduled Meds:  Scheduled Meds: . bisacodyl  10 mg Rectal BID  . carvedilol  6.25 mg Oral BID WC  . docusate sodium  100 mg Oral BID  . doxycycline (VIBRAMYCIN) IV  100 mg Intravenous Q12H  . insulin aspart  0-9 Units Subcutaneous Q6H  . insulin detemir  22 Units Subcutaneous QHS  . metronidazole  500 mg Intravenous Q8H  . sodium chloride  3 mL Intravenous Q12H   Continuous Infusions: . pantoprozole (PROTONIX) infusion 8 mg/hr (02/18/13 0631)    PHYSICAL EXAM Filed Vitals:   02/17/13 1432 02/17/13 1654 02/17/13 2100 02/18/13 0524  BP: 111/62 106/58 117/55 105/75  Pulse: 96 97 91 90  Temp: 98.3 F (36.8 C)  97.5 F (36.4 C) 97.8 F (36.6 C)  TempSrc: Oral  Oral Oral  Resp: 17  18 19   Height:      Weight:    179 lb 7.3 oz (81.4 kg)  SpO2: 100%  98% 97%    Well developed and nourished in no acute distress HENT normal Neck supple  Clear Regular rate and rhythm, no murmurs or gallops Abd-soft with active BS No Clubbing cyanosis edema Skin-warm and dry A & Oriented  Grossly  normal sensory and motor function    Intake/Output Summary (Last 24 hours) at 02/18/13 0832 Last data filed at 02/18/13 0700  Gross per 24 hour  Intake      3 ml  Output    675 ml  Net   -672 ml    LABS: Basic Metabolic Panel:  Recent Labs Lab 02/12/13 1739 02/12/13 1745 02/13/13 1556 02/14/13 0445 02/15/13 0805 02/16/13 0415 02/17/13 0540 02/18/13 0540  NA 129* 132* 135 135 142 146* 148* 149*  K 3.9 4.0 3.9 3.9 4.0 4.2 4.5 4.4  CL 94* 97 101 101 110 113* 117* 119*  CO2 24  --  23 21 22 22 21 20   GLUCOSE 453* 459* 175* 291* 207* 235* 286* 305*  BUN 21 21 19 22 22  27* 31* 39*  CREATININE 1.21 1.40* 1.31 1.28 1.35 1.48* 1.27 1.42*  CALCIUM 9.4  --  8.3* 8.5 7.9* 8.1* 8.1* 8.1*   Cardiac Enzymes: No results found for this basename: CKTOTAL, CKMB, CKMBINDEX, TROPONINI,  in the last 72 hours CBC:  Recent Labs Lab 02/12/13 1739 02/12/13 1745 02/13/13 1556 02/14/13 0445 02/15/13 0805 02/16/13 0415 02/17/13 0540 02/18/13 0540  WBC  3.1*  --  4.0 3.6* 1.7* 1.9* 4.3 7.0  NEUTROABS 2.7  --   --   --  1.6*  --   --   --   HGB 12.9* 14.3 11.1* 11.5* 11.8* 13.0 14.2 13.5  HCT 38.2* 42.0 33.1* 34.8* 34.9* 38.9* 42.0 40.1  MCV 73.9*  --  74.0* 74.7* 74.1* 75.1* 74.9* 74.8*  PLT 157  --  112* 98* 59* 45* 54* 65*   PROTIME:  Recent Labs  02/16/13 0415 02/17/13 0540  LABPROT 19.6* 14.1  INR 1.72* 1.11     ASSESSMENT AND PLAN:  Principal Problem:   Weakness generalized Active Problems:   AODM   CARDIOMYOPATHY, PRIMARY, DILATED   LBBB   SYSTOLIC HEART FAILURE, CHRONIC   HTN (hypertension)   Chronic anticoagulation   Atrial fibrillation   Biventricular implantable cardioverter-defibrillator in situ   Fever of undetermined origin   Altered mental status  Much improved  \ contnue supportive care   Signed, Sherryl Manges MD  02/18/2013

## 2013-02-18 NOTE — Discharge Summary (Signed)
Physician Discharge Summary  [name] [name] ZOX:[id number] DOB: Sep 30, 1945 DOA: 02/12/2013  PCP: [name], MD  Admit date: 02/12/2013 Discharge date: 02/18/2013  Recommendations for Outpatient Follow-up:  1. Pt will need to follow up with PCP in 2 weeks post discharge 2. Please obtain BMP to evaluate electrolytes and kidney function 3. Please also check CBC to evaluate Hg and Hct levels 4. Please followup on RMSF serologies and Ehrlichia serology  Discharge Diagnoses:  Principal Problem:   Weakness generalized Active Problems:   AODM   CARDIOMYOPATHY, PRIMARY, DILATED   LBBB   SYSTOLIC HEART FAILURE, CHRONIC   HTN (hypertension)   Chronic anticoagulation   Atrial fibrillation   Biventricular implantable cardioverter-defibrillator in situ   Fever of undetermined origin   Altered mental status Fever of unknown origin  -Last temperature greater than 38.0C on 02/15/2013@1900   -Await results of tickborne serologies  -The patient's wife related a history that he was bitten by a tick approximately 23 weeks prior to admission--> doxycycline was started -Continue intravenous doxycycline, started 02/15/2013  -Appears to have defervescence since institution of doxycycline  -The patient was transitioned to oral doxycycline, 100 mg twice a day for 10 additional days which will complete 14 days of antibiotic therapy. -HIDA scan negative  -CT abdomen shows gallbladder sludge and benign splenule  -Appreciate ID followup  -Blood cultures remain negative  -discussed with Dr. Drue Second today--given clinical improvement, can hold off on LP, but can reassess need if worsens again and if fever returns  -d/c Flagyl  -Dr. Drue Second recommended finishing course of doxycycline and follow up with Erhlichia and RMSF and Lyme serologies Delirium in the setting of dementia  -Ammonia, TSH, B12, ABG, RPR negative  -more lucid since the institution of doxycycline  Generalized  weakness/deconditioning  -Continue PT--patient participated -Skilled nursing facility placement was recommended History of atrial fibrillation, left bundle branch block  -Appreciate cardiology recommendations  -Will not restart Coumadin at this time per cardiology recommendations -Plan to restart aspirin 325mg  daily Pancytopenia  -may be due to tick borne infection--RMSF IgG 0.18, but rest of serologies pending -WBC and platelets showing recovery  -WBC if 7.0 the day of discharge, platelets 65,000 -Continue doxycycline  -Haptoglobin 471 -Fibrinogen 440 -INR 1.1 on -Peripheral smear did not reveal microangiopathic hemolysis -HIT panel pending of day of d/c -Check fibrinogen, haptoglobin, peripheral smear, HIT panel  Chronic systolic and diastolic CHF  -EF 20 -25%  -Appears euvolemic at this time  -Because of the patient's soft blood pressures, the patient's carvedilol dose was decreased to 6.25 mg twice a day -Discontinue titrated up as necessary with monitoring the patient's blood pressure -His spironolactone was restarted Abdominal pain  -Protonix drip initially instituted-->d/ced prior to discharge -Patient's diet was advanced and patient tolerated without difficulty -appreciate Dr. Madilyn Fireman who did not recommend any further workup at this time  -Continues to complain of intermittent epigastric discomfort with one episode of emesis last night (6/25)  -Patient did not have any further episodes of emesis and was able to tolerate his diet. His abdominal pain improved on the day of discharge. -HIDA scan on 02/14/2013 did not show any evidence of cholecystitis -CT abdomen on 02/13/2013 showed sludge versus tiny gallstones Hypertension  -Titrate carvedilol as the pressure tolerates  -Restart valsartan Diabetes mellitus type 2  -Restart Januvia  -Changed to  Levemir 25 units at bedtime  -We'll not restart Avandia given the patient's history of CHF and NICM  -Due to the patient's CKD,  the patient's glipizide  was restarted at 10 mg daily rather than twice a day Family Communication: Pt at beside  Disposition Plan: Home when medically stable  Antibiotics:  Doxycycline 02/15/2013>>>  Flagyl 02/14/2013>>>02/17/13  Allergies: No known drug allergies Discharge Condition: stable  Disposition:  Follow-up Information   Follow up with Sherryl Manges, MD In 2 weeks.   Contact information:   [address] [place] [address] [phone]       Follow up with [name], MD In 2 weeks.   Contact information:   [address] [place] [address] [phone]     SNF  Diet:heart healthy Wt Readings from Last 3 Encounters:  02/18/13 81.4 kg (179 lb 7.3 oz)  07/20/12 92.534 kg (204 lb)  06/19/11 93.35 kg (205 lb 12.8 oz)    History of present illness:  67 y.o. male with hx of DM, dilated CMP, HTN, afib on Coumadin, acute on chronic diastolic CHF, s/p ICD, dementia with progressive decline over the past year, frequent falls at home, brought to the ER because of generalized weakness, and having another fall. In the ER, he was found to have a fever with Tmax of 102. Evaluation included a head CT which showed no acute processes, a negative UA, and a negative CXR for any evidence of infection. His WBC is 3.1K and lactic acid was normal, normal platelet count, and Cr of 1.2. His Na is 129 and he appears only slightly volume depleted clinically. Hospitialist was asked to admit him for generalized weakness and fever of undetermined source. He has no abdominal pain, nausea, vomiting, join swelling, diarrhea or rash. The only complaint is a mild nonproductive cough for one day. After the initial admission, the patient did complain of abdominal pain. The patient was started on intravenous fluids. Because of the patient's fever and abdominal pain, CT of the abdomen and pelvis was obtained. It showed gallbladder sludge versus small cholelithiasis with  benign splenule. HIDA scan was obtained and did not show any evidence of cholecystitis. Echocardiogram on 02/15/2013 showed EF 20-25%, grade 1 diastolic dysfunction. The patient became progressively pancytopenic. Infectious disease was consulted. The patient was empirically started on doxycycline. Initially, lumbar puncture was recommended in the face of the patient's confusion. However 24 hours after starting doxycycline, the patient's pancytopenia gradually improved, and the patient defervescence.. In addition, the patient's confusion also improved. DIC panel was negative for hemolysis. Workup for TTP was negative. Additional workup for the patient's altered mental status was unremarkable. Because of the patient's pancytopenia, the patient's warfarin was discontinued. Cardiology was consulted. In light of the patient's falls, they recommended discontinuing warfarin altogether. The patient will be restarted on aspirin for his atrial fibrillation. During the hospitalization, the patient's blood pressure was initially soft. His carvedilol was restarted on a lower dose, 6.25 mg twice a day. At the time of discharge, the patient's furosemide, valsartan and spironolactone will be restarted given the patient's history of CHF and cardiomyopathy. The patient will need to followup with his cardiologist--Dr. Sherryl Manges, infectious disease--Dr. Judyann Munson, and gastroenterologist--Dr. Dorena Cookey post discharge.     Consultants: Neurology [facility] ID, Dr. Drue Second GI, Dr. Madilyn Fireman  Discharge Exam: Filed Vitals:   02/18/13 0524  BP: 105/75  Pulse: 90  Temp: 97.8 F (36.6 C)  Resp: 19   Filed Vitals:   02/17/13 1432 02/17/13 1654 02/17/13 2100 02/18/13 0524  BP: 111/62 106/58 117/55 105/75  Pulse: 96 97 91 90  Temp: 98.3 F (36.8 C)  97.5 F (36.4 C) 97.8 F (36.6 C)  TempSrc: Oral  Oral Oral  Resp: 17  18 19   Height:      Weight:    81.4 kg (179 lb 7.3 oz)  SpO2: 100%  98% 97%    General: A&Ox2, NAD, pleasant, cooperative Cardiovascular: RRR, no rub, no gallop, no S3 Respiratory: CTAB, no wheeze, no rhonchi Abdomen:soft, nontender, nondistended, positive bowel sounds Extremities: No edema, No lymphangitis, no petechiae  Discharge Instructions      Discharge Orders   Future Appointments Provider Department Dept Phone   03/03/2013 9:15 AM [facility] [phone]   03/31/2013 8:30 AM [facility] Main Office [place]) [phone]   Future Orders Complete By Expires     Diet - low sodium heart healthy  As directed     Increase activity slowly  As directed         Medication List    STOP taking these medications       metFORMIN 1000 MG tablet  Commonly known as:  GLUCOPHAGE     pantoprazole 40 MG tablet  Commonly known as:  PROTONIX     rosiglitazone 2 MG tablet  Commonly known as:  AVANDIA     warfarin 5 MG tablet  Commonly known as:  COUMADIN      TAKE these medications       aspirin 325 MG tablet  Take 325 mg by mouth daily.     atorvastatin 10 MG tablet  Commonly known as:  LIPITOR  Take 10 mg by mouth daily.     carvedilol 6.25 MG tablet  Commonly known as:  COREG  Take 1 tablet (6.25 mg total) by mouth 2 (two) times daily with a meal.     doxycycline 100 MG tablet  Commonly known as:  VIBRA-TABS  Take 1 tablet (100 mg total) by mouth every 12 (twelve) hours.     esomeprazole 40 MG capsule  Commonly known as:  NEXIUM  Take 40 mg by mouth as needed (acid reflux).     furosemide 40 MG tablet  Commonly known as:  LASIX  Take 40 mg by mouth 2 (two) times daily.     glipiZIDE 10 MG tablet  Commonly known as:  GLUCOTROL  Take 1 tablet (10 mg total) by mouth daily.     insulin detemir 100 UNIT/ML injection  Commonly known as:  LEVEMIR  Inject 0.25 mLs (25 Units total) into the skin at bedtime.     sitaGLIPtin 100 MG tablet  Commonly known as:   JANUVIA  Take 100 mg by mouth daily.     spironolactone 25 MG tablet  Commonly known as:  ALDACTONE  Take 0.5 tablets (12.5 mg total) by mouth daily.     valsartan 80 MG tablet  Commonly known as:  DIOVAN  Take 80 mg by mouth every morning.         The results of significant diagnostics from this hospitalization (including imaging, microbiology, ancillary and laboratory) are listed below for reference.    Significant Diagnostic Studies: Dg Chest 2 View  02/12/2013   *RADIOLOGY REPORT*  Clinical Data: Weakness, fall, numbness in feet, headache, nausea, history diabetes, hypertension, nonischemic cardiomyopathy  CHEST - 2 VIEW  Comparison: 12/22/2003  Findings: Left subclavian pacemaker / AICD leads project at right atrium, right ankle, and coronary sinus. Upper normal heart size. Normal mediastinal contours and pulmonary vascularity for AP technique. Lungs appear hyperinflated without infiltrate, pleural  effusion or pneumothorax. Minimal peribronchial thickening. No acute osseous findings.  IMPRESSION: Post AICD. Bronchitic changes and hyperinflation without acute infiltrate.   Original Report Authenticated By: Ulyses Southward, M.D.   Ct Head Wo Contrast  02/12/2013   *RADIOLOGY REPORT*  Clinical Data: Frequent falls with unsteady gait and confusion.  CT HEAD WITHOUT CONTRAST  Technique:  Contiguous axial images were obtained from the base of the skull through the vertex without contrast.  Comparison: None.  Findings: Mild to moderate atrophy.  Chronic microvascular ischemic change affects the periventricular and subcortical white matter. No acute stroke or hemorrhage.  No mass lesion or hydrocephalus. No extra-axial fluid.  Small calcifications in the mid pons, likely dystrophic from a small vascular malformation or remote infarction. No evidence for pontine mass.  Advanced vascular calcification. Calvarium intact.  Clear sinuses and mastoids.  IMPRESSION: Mild to moderate atrophy and chronic  microvascular ischemic change. No acute intracranial findings.   Original Report Authenticated By: Davonna Belling, M.D.   Nm Hepatobiliary  02/14/2013   *RADIOLOGY REPORT*  Clinical Data:  Question cholecystitis.  Abdominal pain.  NUCLEAR MEDICINE HEPATOBILIARY IMAGING  Technique:  Sequential images of the abdomen were obtained out to 60 minutes following intravenous administration of radiopharmaceutical.  Radiopharmaceutical:  5.42mCi Tc-57m Choletec  Comparison:  CT 10/16/2012  Findings: There is homogeneous accumulation radiotracer within the liver and rapid clears the blood pool.  The gallbladder begins to fill at 20 minutes continues to fill 45 inches.  Counts are evident within the duodenum by 45 minutes.  IMPRESSION: Patent cystic duct and common bile duct.  No evidence of cholecystitis.   Original Report Authenticated By: Genevive Bi, M.D.   Ct Abdomen Pelvis W Contrast  02/13/2013   *RADIOLOGY REPORT*  Clinical Data: Fever, nausea, and vomiting  CT ABDOMEN AND PELVIS WITH CONTRAST  Technique:  Multidetector CT imaging of the abdomen and pelvis was performed following the standard protocol during bolus administration of intravenous contrast.  Contrast: OMNIPAQUE IOHEXOL 300 MG/ML  SOLN  Comparison: 03/10/2012  Findings: There is atelectasis noted in both lung bases.  No pericardial or pleural effusion.  No focal liver abnormalities identified.  Sludge versus tiny stones noted within the dependent portion of the gallbladder.  The pancreas is unremarkable.  There is a enhancing nodule in the distal tip of tail of pancreas this measures 1.3 cm and is unchanged from previous exam.  There are several small splenules adjacent to the inferior aspect of the spleen.  The adrenal glands both appear normal.  The right kidney is normal. The left kidney is normal.  No obstructive uropathy noted.  Urinary bladder is unremarkable.  Abdominal aorta appears normal in caliber.  No aneurysm.  There is mild  calcified atherosclerotic disease identified.  No upper abdominal adenopathy identified.  There is no pelvic or inguinal adenopathy.  The stomach appears normal.  The small bowel loops are within normal limits and caliber.  No evidence for obstruction.  The colon appears normal.  There is no free fluid or abnormal fluid collections identified within the abdomen or pelvis.  There is a periumbilical hernia which contains fat only.  Review of the visualized osseous structures is significant for mild lumbar degenerative disc disease.  IMPRESSION:  1.  No acute findings identified within the abdomen or pelvis. 2.  Sludge versus tiny stones within the dependent portion of the gallbladder. 3.  Soft tissue attenuating enhancing nodule within the tip of the tail of pancreas.  This is unchanged  in size from 03/10/2012 and is favored to represent a benign splenule. Presence of benign splenule within the tail of pancreas can be confirmed with heat damage red blood cell nuclear medicine scan.   Original Report Authenticated By: Signa Kell, M.D.   Dg Chest Port 1 View  02/13/2013   *RADIOLOGY REPORT*  Clinical Data: Abdominal pain.  Nausea and vomiting.  Fever and chest pain.  PORTABLE CHEST - 1 VIEW  Comparison: 02/12/2013  Findings: Heart size and pulmonary vascularity are normal and the lungs are clear.  AICD in place.  No acute osseous abnormality.  IMPRESSION: No acute abnormality.   Original Report Authenticated By: Francene Boyers, M.D.   Dg Abd Portable 2v  02/13/2013   *RADIOLOGY REPORT*  Clinical Data: Abdominal pain.  PORTABLE ABDOMEN - 2 VIEW  Comparison: [facility] image for CT scan dated 03/10/2012  Findings: The bowel gas pattern is normal.  No free air or free fluid.  No osseous abnormality.  IMPRESSION: Benign-appearing abdomen.   Original Report Authenticated By: Francene Boyers, M.D.     Microbiology: Recent Results (from the past 240 hour(s))  URINE CULTURE     Status: None   Collection Time     02/12/13  9:39 PM      Result Value Range Status   Specimen Description URINE, CLEAN CATCH   Final   Special Requests NONE   Final   Culture  Setup Time 02/13/2013 03:10   Final   Colony Count NO GROWTH   Final   Culture NO GROWTH   Final   Report Status 02/14/2013 FINAL   Final  CULTURE, BLOOD (ROUTINE X 2)     Status: None   Collection Time    02/13/13 10:50 AM      Result Value Range Status   Specimen Description BLOOD LEFT ARM   Final   Special Requests BOTTLES DRAWN AEROBIC ONLY 10CC   Final   Culture  Setup Time 02/13/2013 17:47   Final   Culture     Final   Value:        BLOOD CULTURE RECEIVED NO GROWTH TO DATE CULTURE WILL BE HELD FOR 5 DAYS BEFORE ISSUING A FINAL NEGATIVE REPORT   Report Status PENDING   Incomplete  CULTURE, BLOOD (ROUTINE X 2)     Status: None   Collection Time    02/13/13 10:58 AM      Result Value Range Status   Specimen Description BLOOD LEFT ARM   Final   Special Requests BOTTLES DRAWN AEROBIC ONLY 10CC   Final   Culture  Setup Time 02/13/2013 17:47   Final   Culture     Final   Value:        BLOOD CULTURE RECEIVED NO GROWTH TO DATE CULTURE WILL BE HELD FOR 5 DAYS BEFORE ISSUING A FINAL NEGATIVE REPORT   Report Status PENDING   Incomplete  CULTURE, BLOOD (ROUTINE X 2)     Status: None   Collection Time    02/15/13  6:55 PM      Result Value Range Status   Specimen Description BLOOD LEFT HAND   Final   Special Requests BOTTLES DRAWN AEROBIC ONLY [facility]   Final   Culture  Setup Time 02/16/2013 04:06   Final   Culture     Final   Value:        BLOOD CULTURE RECEIVED NO GROWTH TO DATE CULTURE WILL BE HELD FOR 5 DAYS BEFORE ISSUING A FINAL NEGATIVE REPORT  Report Status PENDING   Incomplete  CULTURE, BLOOD (ROUTINE X 2)     Status: None   Collection Time    02/15/13  6:56 PM      Result Value Range Status   Specimen Description BLOOD RIGHT HAND   Final   Special Requests BOTTLES DRAWN AEROBIC ONLY 8CC   Final   Culture  Setup Time 02/16/2013 04:07    Final   Culture     Final   Value:        BLOOD CULTURE RECEIVED NO GROWTH TO DATE CULTURE WILL BE HELD FOR 5 DAYS BEFORE ISSUING A FINAL NEGATIVE REPORT   Report Status PENDING   Incomplete     Labs: Basic Metabolic Panel:  Recent Labs Lab 02/14/13 0445 02/15/13 0805 02/16/13 0415 02/17/13 0540 02/18/13 0540  NA 135 142 146* 148* 149*  K 3.9 4.0 4.2 4.5 4.4  CL 101 110 113* 117* 119*  CO2 21 22 22 21 20   GLUCOSE 291* 207* 235* 286* 305*  BUN 22 22 27* 31* 39*  CREATININE 1.28 1.35 1.48* 1.27 1.42*  CALCIUM 8.5 7.9* 8.1* 8.1* 8.1*   Liver Function Tests:  Recent Labs Lab 02/12/13 1739 02/13/13 1556 02/14/13 0445  AST 21 31 59*  ALT 25 26 44  ALKPHOS 101 66 74  BILITOT 0.2* 0.2* 0.3  PROT 7.5 6.2 6.5  ALBUMIN 3.7 2.7* 3.0*    Recent Labs Lab 02/13/13 1556  LIPASE 16    Recent Labs Lab 02/13/13 1050  AMMONIA 17   CBC:  Recent Labs Lab 02/12/13 1739  02/14/13 0445 02/15/13 0805 02/16/13 0415 02/17/13 0540 02/18/13 0540  WBC 3.1*  < > 3.6* 1.7* 1.9* 4.3 7.0  NEUTROABS 2.7  --   --  1.6*  --   --   --   HGB 12.9*  < > 11.5* 11.8* 13.0 14.2 13.5  HCT 38.2*  < > 34.8* 34.9* 38.9* 42.0 40.1  MCV 73.9*  < > 74.7* 74.1* 75.1* 74.9* 74.8*  PLT 157  < > 98* 59* 45* 54* 65*  < > = values in this interval not displayed. Cardiac Enzymes:  Recent Labs Lab 02/12/13 1739  TROPONINI <0.30   BNP: No components found with this basename: POCBNP,  CBG:  Recent Labs Lab 02/17/13 0600 02/17/13 1127 02/17/13 1659 02/18/13 0045 02/18/13 0616  GLUCAP 252* 305* 297* 285* 264*    Time coordinating discharge:  Greater than 30 minutes  Signed:  Lulia Schriner, DO [facility] Pager: [phone] 02/18/2013, 12:34 PM

## 2013-02-18 NOTE — Clinical Social Work Psychosocial (Signed)
    Clinical Social Work Department BRIEF PSYCHOSOCIAL ASSESSMENT 02/18/2013  Patient:  [name] [name], [name] [name]     Account Number:  [id number]     Admit date:  02/12/2013  Clinical Social Worker:  Santa Genera, CLINICAL SOCIAL WORKER  Date/Time:  02/17/2013 04:00 PM  Referred by:  Care Management  Date Referred:  02/17/2013 Referred for  SNF Placement   Other Referral:   Interview type:  Other - See comment Other interview type:   Wife also in room and participating w patient consent    PSYCHOSOCIAL DATA Living Status:  FAMILY Admitted from facility:   Level of care:   Primary support name:  Raeden Schippers Primary support relationship to patient:  SPOUSE Degree of support available:   Wife unable to manage patient care needs at home.    CURRENT CONCERNS Current Concerns  Post-Acute Placement   Other Concerns:    SOCIAL WORK ASSESSMENT / PLAN CSW met w patient and family at bedside, patient oriented and somewhat sleepy.  Explained to patient and wife that he cannot be placed at [facility], but insurance would allow SNF placement.  Patient and wife reluctantly agreed that he would agree to CSW seeking bed offers for SNF.  Wife has recent shoulder injury, unable to assist w physical care needs.  Says that she would prefer [facility] as this is closer to their home in [place].  CSW explained placement process, advised that patient may have copays and deductibles.  Asked that they contact SNF of choice to get accurate information on their financial obligations for placement.  Patient willing to assist w bed search process.   Assessment/plan status:  Psychosocial Support/Ongoing Assessment of Needs Other assessment/ plan:   Information/referral to community resources:   SNF list    PATIENT'S/FAMILY'S RESPONSE TO PLAN OF CARE: Wife says she would prefer placement at [facility] as it is close to their home.    Santa Genera, LCSW Clinical Social Worker [phone])

## 2013-02-19 LAB — CULTURE, BLOOD (ROUTINE X 2): Culture: NO GROWTH

## 2013-02-21 LAB — HEPARIN INDUCED THROMBOCYTOPENIA PNL
Heparin Induced Plt Ab: NEGATIVE
Patient O.D.: 0.082
UFH Low Dose 0.1 IU/mL: 0 % Release
UFH Low Dose 0.5 IU/mL: 0 % Release

## 2013-02-21 LAB — ROCKY MTN SPOTTED FVR AB, IGM-BLOOD: RMSF IgM: 0.14 IV (ref 0.00–0.89)

## 2013-02-21 LAB — ROCKY MTN SPOTTED FVR AB, IGG-BLOOD: RMSF IgG: 0.18 IV

## 2013-02-22 LAB — CULTURE, BLOOD (ROUTINE X 2): Culture: NO GROWTH

## 2013-02-27 NOTE — Progress Notes (Signed)
Please see attending note

## 2013-03-02 ENCOUNTER — Other Ambulatory Visit (HOSPITAL_COMMUNITY): Payer: Self-pay | Admitting: Internal Medicine

## 2013-03-02 ENCOUNTER — Ambulatory Visit (HOSPITAL_COMMUNITY)
Admission: RE | Admit: 2013-03-02 | Discharge: 2013-03-02 | Disposition: A | Discharge status: Home / Self Care | Payer: Medicare Other | Source: Ambulatory Visit | Attending: Internal Medicine | Admitting: Internal Medicine

## 2013-03-02 DIAGNOSIS — G319 Degenerative disease of nervous system, unspecified: Secondary | ICD-10-CM | POA: Insufficient documentation

## 2013-03-02 DIAGNOSIS — R5383 Other fatigue: Secondary | ICD-10-CM | POA: Insufficient documentation

## 2013-03-02 DIAGNOSIS — I428 Other cardiomyopathies: Secondary | ICD-10-CM | POA: Insufficient documentation

## 2013-03-02 DIAGNOSIS — R5381 Other malaise: Secondary | ICD-10-CM | POA: Insufficient documentation

## 2013-03-02 DIAGNOSIS — R278 Other lack of coordination: Secondary | ICD-10-CM

## 2013-03-02 DIAGNOSIS — I4891 Unspecified atrial fibrillation: Secondary | ICD-10-CM | POA: Insufficient documentation

## 2013-03-25 ENCOUNTER — Encounter (HOSPITAL_BASED_OUTPATIENT_CLINIC_OR_DEPARTMENT_OTHER): Payer: Medicare Other | Attending: General Surgery

## 2013-03-25 DIAGNOSIS — I2510 Atherosclerotic heart disease of native coronary artery without angina pectoris: Secondary | ICD-10-CM | POA: Insufficient documentation

## 2013-03-25 DIAGNOSIS — L8992 Pressure ulcer of unspecified site, stage 2: Secondary | ICD-10-CM | POA: Insufficient documentation

## 2013-03-25 DIAGNOSIS — E119 Type 2 diabetes mellitus without complications: Secondary | ICD-10-CM | POA: Insufficient documentation

## 2013-03-25 DIAGNOSIS — L89609 Pressure ulcer of unspecified heel, unspecified stage: Secondary | ICD-10-CM | POA: Insufficient documentation

## 2013-03-25 DIAGNOSIS — I10 Essential (primary) hypertension: Secondary | ICD-10-CM | POA: Insufficient documentation

## 2013-03-25 DIAGNOSIS — I509 Heart failure, unspecified: Secondary | ICD-10-CM | POA: Insufficient documentation

## 2013-03-25 DIAGNOSIS — I4891 Unspecified atrial fibrillation: Secondary | ICD-10-CM | POA: Insufficient documentation

## 2013-03-26 NOTE — Progress Notes (Signed)
[facility]  NAME:  LANTZ, HERMANN NO.:  [id number]  MEDICAL RECORD NO.:  [id number]      DATE OF BIRTH:  1946/05/29  PHYSICIAN:  Ardath Sax, M.D.           VISIT DATE:                                  OFFICE VISIT   [name] is a 67 year old, African American male, who has a great deal of heart problems including cardiomyopathy, left bundle branch block, congestive heart failure, hypertension, atrial fibrillation.  He also has type 2 diabetes.  He apparently was in the hospital for these heart problems and developed fairly significant pressure ulcers on both of his heels.  Today, I looked at these heels and all of the skin is dead with hemorrhage under the skin.  I peeled off all the epidermis on both of the heels which was about 6 or 7 cm in diameter.  Fortunately, it looks like the dermis is viable.  I think if we just offload him and I would treat him with collagen, I think this will get better.  So, we put the heel bumpers on him with silver collagen.  He is taking many medicines including carvedilol, Lasix, glipizide, insulin, spironolactone, valsartan, and we will see him back here in a week.  So, his diagnosis is pressure ulcers stage II on both of his heels.  Other diagnoses, coronary artery disease, hypertension, and type 2 diabetes.     Ardath Sax, M.D.     PP/MEDQ  D:  03/25/2013  T:  03/26/2013  Job:  [id number]

## 2013-03-31 ENCOUNTER — Encounter: Payer: Medicare Other | Admitting: *Deleted

## 2013-04-05 ENCOUNTER — Other Ambulatory Visit (HOSPITAL_COMMUNITY): Payer: Self-pay | Admitting: General Surgery

## 2013-04-05 DIAGNOSIS — T8189XD Other complications of procedures, not elsewhere classified, subsequent encounter: Secondary | ICD-10-CM

## 2013-04-07 ENCOUNTER — Ambulatory Visit (HOSPITAL_COMMUNITY)
Admission: RE | Admit: 2013-04-07 | Discharge: 2013-04-07 | Disposition: A | Discharge status: Home / Self Care | Payer: Medicare Other | Source: Ambulatory Visit | Attending: Cardiovascular Disease | Admitting: Cardiovascular Disease

## 2013-04-07 DIAGNOSIS — I739 Peripheral vascular disease, unspecified: Secondary | ICD-10-CM

## 2013-04-07 DIAGNOSIS — Y849 Medical procedure, unspecified as the cause of abnormal reaction of the patient, or of later complication, without mention of misadventure at the time of the procedure: Secondary | ICD-10-CM | POA: Insufficient documentation

## 2013-04-07 DIAGNOSIS — T8189XA Other complications of procedures, not elsewhere classified, initial encounter: Secondary | ICD-10-CM | POA: Insufficient documentation

## 2013-04-07 DIAGNOSIS — Z5189 Encounter for other specified aftercare: Secondary | ICD-10-CM

## 2013-04-07 DIAGNOSIS — T8189XD Other complications of procedures, not elsewhere classified, subsequent encounter: Secondary | ICD-10-CM

## 2013-04-07 NOTE — Progress Notes (Signed)
Arterial Duplex Lower Ext. Completed. Maretta Overdorf, BS, RDMS, RVT  

## 2013-04-29 ENCOUNTER — Encounter (HOSPITAL_BASED_OUTPATIENT_CLINIC_OR_DEPARTMENT_OTHER): Payer: Medicare Other | Attending: General Surgery

## 2013-04-29 DIAGNOSIS — L89609 Pressure ulcer of unspecified heel, unspecified stage: Secondary | ICD-10-CM | POA: Insufficient documentation

## 2013-04-29 DIAGNOSIS — L8993 Pressure ulcer of unspecified site, stage 3: Secondary | ICD-10-CM | POA: Insufficient documentation

## 2013-05-02 ENCOUNTER — Telehealth: Payer: Self-pay | Admitting: Internal Medicine

## 2013-05-02 NOTE — Telephone Encounter (Signed)
New problem    Need order for lab work put into the system

## 2013-05-02 NOTE — Telephone Encounter (Signed)
Patient states that he has been in rehab for the past 10 weeks and would like to know about re starting his coumadin checks. I will forward this to clinic for follow-up

## 2013-05-04 NOTE — Telephone Encounter (Signed)
Spoke with pt.  He was discharged from [facility] last week.  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  Wife is not in the home right now.  He will have her call us with more information.

## 2013-05-06 ENCOUNTER — Telehealth: Payer: Self-pay | Admitting: Internal Medicine

## 2013-05-06 NOTE — Telephone Encounter (Signed)
Asking to switch coumadin checks to [facility] with Dr. Kevan Ny, it is more convenient for them. Will discuss this with coumadin clinic and get back with patient.

## 2013-05-06 NOTE — Telephone Encounter (Signed)
New Problem   Pt was in the rest home and wanted a lab // was advised to get it done at [facility]... pt request a calll back about results as soon as they are recieved.

## 2013-05-09 NOTE — Telephone Encounter (Signed)
Spoke with pt's wife.  Pt is no longer even taking Coumadin so there is no need to have anything checked.

## 2013-05-20 ENCOUNTER — Encounter: Payer: Self-pay | Admitting: Cardiology

## 2013-05-23 ENCOUNTER — Encounter: Payer: Self-pay | Admitting: Cardiology

## 2013-05-27 ENCOUNTER — Ambulatory Visit (HOSPITAL_COMMUNITY)
Admission: RE | Admit: 2013-05-27 | Discharge: 2013-05-27 | Disposition: A | Discharge status: Home / Self Care | Payer: Medicare Other | Source: Ambulatory Visit | Attending: General Surgery | Admitting: General Surgery

## 2013-05-27 ENCOUNTER — Encounter (HOSPITAL_BASED_OUTPATIENT_CLINIC_OR_DEPARTMENT_OTHER): Payer: Medicare Other | Attending: General Surgery

## 2013-05-27 ENCOUNTER — Other Ambulatory Visit (HOSPITAL_BASED_OUTPATIENT_CLINIC_OR_DEPARTMENT_OTHER): Payer: Self-pay | Admitting: General Surgery

## 2013-05-27 DIAGNOSIS — L84 Corns and callosities: Secondary | ICD-10-CM | POA: Insufficient documentation

## 2013-05-27 DIAGNOSIS — M869 Osteomyelitis, unspecified: Secondary | ICD-10-CM

## 2013-05-27 DIAGNOSIS — E1169 Type 2 diabetes mellitus with other specified complication: Secondary | ICD-10-CM | POA: Insufficient documentation

## 2013-05-27 DIAGNOSIS — L8993 Pressure ulcer of unspecified site, stage 3: Secondary | ICD-10-CM | POA: Insufficient documentation

## 2013-05-27 DIAGNOSIS — L97509 Non-pressure chronic ulcer of other part of unspecified foot with unspecified severity: Secondary | ICD-10-CM | POA: Insufficient documentation

## 2013-05-27 DIAGNOSIS — L89609 Pressure ulcer of unspecified heel, unspecified stage: Secondary | ICD-10-CM | POA: Insufficient documentation

## 2013-05-27 DIAGNOSIS — R937 Abnormal findings on diagnostic imaging of other parts of musculoskeletal system: Secondary | ICD-10-CM | POA: Insufficient documentation

## 2013-06-08 ENCOUNTER — Encounter: Payer: Self-pay | Admitting: Cardiology

## 2013-06-28 ENCOUNTER — Other Ambulatory Visit: Payer: Self-pay

## 2013-06-28 MED ORDER — FUROSEMIDE 40 MG PO TABS: 40.0000 mg | ORAL_TABLET | Freq: Two times a day (BID) | ORAL | Status: DC

## 2013-06-28 MED ORDER — SPIRONOLACTONE 25 MG PO TABS: 12.5000 mg | ORAL_TABLET | Freq: Every day | ORAL | Status: DC

## 2013-07-01 ENCOUNTER — Encounter (HOSPITAL_BASED_OUTPATIENT_CLINIC_OR_DEPARTMENT_OTHER): Payer: Medicare Other | Attending: General Surgery

## 2013-07-01 DIAGNOSIS — L89609 Pressure ulcer of unspecified heel, unspecified stage: Secondary | ICD-10-CM | POA: Insufficient documentation

## 2013-07-01 DIAGNOSIS — L8993 Pressure ulcer of unspecified site, stage 3: Secondary | ICD-10-CM | POA: Insufficient documentation

## 2013-07-26 ENCOUNTER — Encounter (INDEPENDENT_AMBULATORY_CARE_PROVIDER_SITE_OTHER): Payer: Self-pay

## 2013-07-26 ENCOUNTER — Encounter: Payer: Self-pay | Admitting: Internal Medicine

## 2013-07-26 ENCOUNTER — Ambulatory Visit (INDEPENDENT_AMBULATORY_CARE_PROVIDER_SITE_OTHER): Payer: Medicare Other | Admitting: Internal Medicine

## 2013-07-26 VITALS — BP 120/67 | HR 96 | Ht 69.0 in | Wt 188.4 lb

## 2013-07-26 DIAGNOSIS — I4891 Unspecified atrial fibrillation: Secondary | ICD-10-CM

## 2013-07-26 DIAGNOSIS — I5022 Chronic systolic (congestive) heart failure: Secondary | ICD-10-CM

## 2013-07-26 DIAGNOSIS — I428 Other cardiomyopathies: Secondary | ICD-10-CM

## 2013-07-26 DIAGNOSIS — Z95810 Presence of automatic (implantable) cardiac defibrillator: Secondary | ICD-10-CM

## 2013-07-26 LAB — MDC_IDC_ENUM_SESS_TYPE_INCLINIC
Battery Remaining Longevity: 72 mo
HighPow Impedance: 36 Ohm
HighPow Impedance: 49 Ohm
Implantable Pulse Generator Serial Number: 587847
Lead Channel Impedance Value: 533 Ohm
Lead Channel Impedance Value: 568 Ohm
Lead Channel Pacing Threshold Amplitude: 1.1 V
Lead Channel Pacing Threshold Pulse Width: 0.4 ms
Lead Channel Pacing Threshold Pulse Width: 0.4 ms
Lead Channel Pacing Threshold Pulse Width: 0.4 ms
Lead Channel Sensing Intrinsic Amplitude: 13.1 mV
Lead Channel Sensing Intrinsic Amplitude: 13.6 mV
Lead Channel Setting Pacing Amplitude: 2.4 V
Lead Channel Setting Pacing Amplitude: 2.5 V
Lead Channel Setting Pacing Pulse Width: 0.4 ms
Lead Channel Setting Sensing Sensitivity: 0.5 mV
Lead Channel Setting Sensing Sensitivity: 1 mV
Zone Setting Detection Interval: 343 ms

## 2013-07-26 LAB — BASIC METABOLIC PANEL
BUN: 20 mg/dL (ref 6–23)
Chloride: 105 mEq/L (ref 96–112)
GFR: 93.98 mL/min (ref >60.00)
Glucose, Bld: 209 mg/dL — ABNORMAL HIGH (ref 70–99)
Potassium: 4.1 mEq/L (ref 3.5–5.1)
Sodium: 139 mEq/L (ref 135–145)

## 2013-07-26 NOTE — Assessment & Plan Note (Signed)
Holding sinus rhythm; we will decrease his aspirin 325-81

## 2013-07-26 NOTE — Assessment & Plan Note (Signed)
Is modestly volume overloaded but this has been getting better. We'll check his metabolic profile significant augment diuresis

## 2013-07-26 NOTE — Assessment & Plan Note (Signed)
--  Continue ARB 

## 2013-07-26 NOTE — Assessment & Plan Note (Signed)
The patient's device was interrogated.  The information was reviewed. No changes were made in the programming.    

## 2013-07-26 NOTE — Progress Notes (Signed)
Patient Care Team: [name] Noble, MD as PCP - General (Internal Medicine)   HPI  [facility] [name] [name] is a 67 y.o. male  seen as a followup as per MADIT-CRT trial implanted for a nonischemic cardiomyopathy with a CRT-D device. He is status post generator replacement     He is doing  Better following 12 weeks at [facility] folowing hospitalization for FUO and profound weakness   He denies chest pain. He has mild fatigue no specific shortness of breath his is also undergoing treatment for nonhealing ulcers developed during rehabilitation  Past Medical History  Diagnosis Date  . Diabetes mellitus   . GERD (gastroesophageal reflux disease)   . Obesity   . Cardiomyopathy, nonischemic     a. Cath 2003: mild nonobstructive CAD, EF 25% at that time.  . Chronic anticoagulation   . Hypertension   . ICD (implantable cardiac defibrillator) in place     a. 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  . Chronic systolic CHF (congestive heart failure)     a. NICM EF 25% dating back to at least 2003.  [name] [name] A-fib     a. Noted on ICD interrogation 2012.  [name] [name] NSVT (nonsustained ventricular tachycardia)     a. Noted on ICD interrogation in 2011.  [name] [name] LBBB (left bundle branch block)   . CAD (coronary artery disease)     a. Nonobstructive by cath 09/2001.  [name] [name] Kidney stone   . Dementia     Past Surgical History  Procedure Laterality Date  . Cardiac catheterization  10/01/2001    THERE WAS GLOBAL HYPOKINESIS AND EF 25%. THERE APPEARED TO BE GLOBAL DECREASE IN WALL MOTION  . Lithotripsy  2001  . Cervical spine surgery  1994  . Cardiac defibrillator placement  06/2009    WITH GENERATOR REPLACED; BiV ICD  . US echocardiography  03/21/2008    EF 30-35%  . Cardiovascular stress test  03/20/2009    EF 33%    Current Outpatient Prescriptions  Medication Sig Dispense Refill  . aspirin 325 MG tablet Take 325 mg by mouth daily.        [name] [name] atorvastatin (LIPITOR) 10 MG tablet Take  10 mg by mouth daily.        . carvedilol (COREG) 12.5 MG tablet Take 12.5 mg by mouth 2 (two) times daily with a meal.      . esomeprazole (NEXIUM) 40 MG capsule Take 40 mg by mouth as needed (acid reflux).       . furosemide (LASIX) 40 MG tablet Take 1 tablet (40 mg total) by mouth 2 (two) times daily.  60 tablet  1  . insulin detemir (LEVEMIR) 100 UNIT/ML injection Inject 45 Units into the skin at bedtime.      . metFORMIN (GLUCOPHAGE) 500 MG tablet Take 1 tablet by mouth 2 (two) times daily.      . Multiple Vitamins-Minerals (MULTIVITAMIN PO) Take 1 tablet by mouth daily.      [name] [name] NOVOLOG 100 UNIT/ML injection Inject into the skin as needed.       . sitaGLIPtin (JANUVIA) 100 MG tablet Take 100 mg by mouth daily.      [name] [name] spironolactone (ALDACTONE) 25 MG tablet Take 0.5 tablets (12.5 mg total) by mouth daily.  90 tablet  0  . valsartan (DIOVAN) 80 MG tablet Take 80 mg by mouth every morning.       No current facility-administered medications for this visit.    No  Known Allergies  Review of Systems negative except from HPI and PMH  Physical Exam BP 120/67  Pulse 96  Ht 5\' 9"  (1.753 m)  Wt 188 lb 6.4 oz (85.458 kg)  BMI 27.81 kg/m2 Well developed and nourished in no acute distress HENT normal Neck supple with JVP-flat Clear Device pocket well healed; without hematoma or erythema.  There is no tethering Regular rate and rhythm, 2/6 murmurAbd-soft with active BS No Clubbing cyanosis 2+ L>R edema Skin-warm and dry A & Oriented  Grossly normal sensory and motor function     Assessment and  Plan

## 2013-07-26 NOTE — Patient Instructions (Addendum)
Your physician recommends that you have lab work today: BMET  Your physician recommends that you continue on your current medications as directed. Please refer to the Current Medication list given to you today.  Remote monitoring is used to monitor your Pacemaker of ICD from home. This monitoring reduces the number of office visits required to check your device to one time per year. It allows [place] to keep an eye on the functioning of your device to ensure it is working properly. You are scheduled for a device check from home on 10/28/2013. You may send your transmission at any time that day. If you have a wireless device, the transmission will be sent automatically. After your physician reviews your transmission, you will receive a postcard with your next transmission date.  Your physician wants you to follow-up in: one year with Dr. Graciela Husbands.  You will receive a reminder letter in the mail two months in advance. If you don't receive a letter, please call our office to schedule the follow-up appointment.

## 2013-07-29 ENCOUNTER — Encounter (HOSPITAL_BASED_OUTPATIENT_CLINIC_OR_DEPARTMENT_OTHER): Payer: Medicare Other | Attending: General Surgery

## 2013-07-29 DIAGNOSIS — L8993 Pressure ulcer of unspecified site, stage 3: Secondary | ICD-10-CM | POA: Insufficient documentation

## 2013-07-29 DIAGNOSIS — L89609 Pressure ulcer of unspecified heel, unspecified stage: Secondary | ICD-10-CM | POA: Insufficient documentation

## 2013-07-29 DIAGNOSIS — L97409 Non-pressure chronic ulcer of unspecified heel and midfoot with unspecified severity: Secondary | ICD-10-CM | POA: Insufficient documentation

## 2013-07-29 DIAGNOSIS — E1169 Type 2 diabetes mellitus with other specified complication: Secondary | ICD-10-CM | POA: Insufficient documentation

## 2013-07-29 NOTE — Progress Notes (Signed)
Quick Note:  LMTCB 11:57pm/PE ______

## 2013-08-02 ENCOUNTER — Telehealth: Payer: Self-pay | Admitting: Internal Medicine

## 2013-08-02 NOTE — Telephone Encounter (Signed)
Follow up    Pt's wife called back for lab results

## 2013-08-02 NOTE — Telephone Encounter (Signed)
A user error has taken place: encounter opened in error, closed for administrative reasons.

## 2013-08-12 ENCOUNTER — Encounter: Payer: Self-pay | Admitting: Internal Medicine

## 2013-08-26 ENCOUNTER — Encounter (HOSPITAL_BASED_OUTPATIENT_CLINIC_OR_DEPARTMENT_OTHER): Payer: Medicare Other | Attending: General Surgery

## 2013-08-26 DIAGNOSIS — L97409 Non-pressure chronic ulcer of unspecified heel and midfoot with unspecified severity: Secondary | ICD-10-CM | POA: Insufficient documentation

## 2013-08-26 DIAGNOSIS — I96 Gangrene, not elsewhere classified: Secondary | ICD-10-CM | POA: Insufficient documentation

## 2013-08-26 DIAGNOSIS — L97509 Non-pressure chronic ulcer of other part of unspecified foot with unspecified severity: Secondary | ICD-10-CM | POA: Insufficient documentation

## 2013-08-26 DIAGNOSIS — E1159 Type 2 diabetes mellitus with other circulatory complications: Secondary | ICD-10-CM | POA: Insufficient documentation

## 2013-08-30 ENCOUNTER — Encounter: Payer: Self-pay | Admitting: Cardiovascular Disease

## 2013-08-30 ENCOUNTER — Ambulatory Visit (INDEPENDENT_AMBULATORY_CARE_PROVIDER_SITE_OTHER): Payer: Medicare Other | Admitting: Cardiovascular Disease

## 2013-08-30 VITALS — BP 115/72 | HR 87 | Ht 69.0 in | Wt 178.0 lb

## 2013-08-30 DIAGNOSIS — I998 Other disorder of circulatory system: Secondary | ICD-10-CM | POA: Insufficient documentation

## 2013-08-30 DIAGNOSIS — I70229 Atherosclerosis of native arteries of extremities with rest pain, unspecified extremity: Secondary | ICD-10-CM

## 2013-08-30 DIAGNOSIS — I999 Unspecified disorder of circulatory system: Secondary | ICD-10-CM

## 2013-08-30 NOTE — Progress Notes (Signed)
08/30/2013 [name]   03-Aug-1946  [id number]  Primary Physician [name], MD Primary Cardiologist: Lorretta Harp MD [name] [name]   HPI:  [name] is a 68 year old married African American male referred by Dr. Judene Companion from the [facility] for evaluation of critical limb ischemia. Dr. Jolyn Nap is his cardiologist in heart care. He has a history of nonischemic cardiac myopathy status post ICD implantation for primary prevention. His other problems include hypertension, insulin-dependent diabetes, chronic atrial fibrillation. He does not smoke. He denies chest pain or shortness of breath. He just saw Dr. Caryl Comes in the office one month ago. He developed she also is back in August and had arterial Doppler studies the office suggesting widely patent arteries. Over last 2 weeks he developed left great toe nonhealing ulcer with what appears to be some dry gangrenous changes on the right great toe. His right heel ulcer has healed but he still has a reactive left and also with a wound VAC.   Current Outpatient Prescriptions  Medication Sig Dispense Refill  . aspirin 325 MG tablet Take 325 mg by mouth daily.        [name] atorvastatin (LIPITOR) 10 MG tablet Take 10 mg by mouth daily.        . carvedilol (COREG) 12.5 MG tablet Take 12.5 mg by mouth 2 (two) times daily with a meal.      . esomeprazole (NEXIUM) 40 MG capsule Take 40 mg by mouth as needed (acid reflux).       . furosemide (LASIX) 40 MG tablet Take 1 tablet (40 mg total) by mouth 2 (two) times daily.  60 tablet  1  . insulin detemir (LEVEMIR) 100 UNIT/ML injection Inject 45 Units into the skin at bedtime.      . metFORMIN (GLUCOPHAGE) 500 MG tablet Take 1 tablet by mouth 2 (two) times daily.      . Multiple Vitamins-Minerals (MULTIVITAMIN PO) Take 1 tablet by mouth daily.      [name] NOVOLOG 100 UNIT/ML injection Inject into the skin as needed.       . sitaGLIPtin (JANUVIA) 100 MG tablet Take 100 mg by  mouth daily.      [name] spironolactone (ALDACTONE) 25 MG tablet Take 0.5 tablets (12.5 mg total) by mouth daily.  90 tablet  0  . valsartan (DIOVAN) 80 MG tablet Take 80 mg by mouth every morning.       No current facility-administered medications for this visit.    No Known Allergies  History   Social History  . Marital Status: Married    Spouse Name: N/A    Number of Children: N/A  . Years of Education: N/A   Occupational History  . Not on file.   Social History Main Topics  . Smoking status: Never Smoker   . Smokeless tobacco: Not on file  . Alcohol Use: No  . Drug Use: No  . Sexual Activity:    Other Topics Concern  . Not on file   Social History Narrative  . No narrative on file     Review of Systems: General: negative for chills, fever, night sweats or weight changes.  Cardiovascular: negative for chest pain, dyspnea on exertion, edema, orthopnea, palpitations, paroxysmal nocturnal dyspnea or shortness of breath Dermatological: negative for rash Respiratory: negative for cough or wheezing Urologic: negative for hematuria Abdominal: negative for nausea, vomiting, diarrhea, bright red blood per rectum, melena, or hematemesis Neurologic: negative for visual changes, syncope, or  dizziness All other systems reviewed and are otherwise negative except as noted above.    Blood pressure 115/72, pulse 87, height 5\' 9"  (1.753 m), weight 178 lb (80.74 kg).  General appearance: alert and no distress Neck: no adenopathy, no carotid bruit, no JVD, supple, symmetrical, trachea midline and thyroid not enlarged, symmetric, no tenderness/mass/nodules Lungs: clear to auscultation bilaterally Heart: irregularly irregular rhythm Extremities: he has a nonhealing left great toe ulcer  with a left heel wound VAC. His left foot is bandaged. There is some edema. He has palpable pedal pulses on the right.  EKG not performed today  ASSESSMENT AND PLAN:   Critical lower limb  ischemia The patient was referred to me by Dr. Judene Companion at [facility] wound care center for evaluation of critical limb ischemia. He had Dopplers performed in our 04/07/13 revealing widely patent arteries on the right and the same on the left except for an occluded posterior tibial. This was done because of bilateral heel ulcers. The patient apparently was in a rehabilitation facility for 12 weeks and developed 2 ulcers as a result of pressure. Over the last 2 weeks he developed nonhealing ulcer on his left great toe with worsening of his left heel ulcer which has a wound VAC on currently. I did repeat his lower extremity arterial Doppler studies and review those prior to making further recommendations.      Lorretta Harp MD FACP,FACC,FAHA, [facility] 08/30/2013 10:12 AM

## 2013-08-30 NOTE — Patient Instructions (Signed)
Dr Gwenlyn Found wants you to have lower extremity dopplers this week and see him back next week.

## 2013-08-30 NOTE — Assessment & Plan Note (Signed)
The patient was referred to me by Dr. Judene Companion at [facility] wound care center for evaluation of critical limb ischemia. He had Dopplers performed in our 04/07/13 revealing widely patent arteries on the right and the same on the left except for an occluded posterior tibial. This was done because of bilateral heel ulcers. The patient apparently was in a rehabilitation facility for 12 weeks and developed 2 ulcers as a result of pressure. Over the last 2 weeks he developed nonhealing ulcer on his left great toe with worsening of his left heel ulcer which has a wound VAC on currently. I did repeat his lower extremity arterial Doppler studies and review those prior to making further recommendations.

## 2013-09-01 ENCOUNTER — Ambulatory Visit (HOSPITAL_COMMUNITY)
Admission: RE | Admit: 2013-09-01 | Discharge: 2013-09-01 | Disposition: A | Discharge status: Home / Self Care | Payer: Medicare Other | Source: Ambulatory Visit | Attending: Cardiovascular Disease | Admitting: Cardiovascular Disease

## 2013-09-01 DIAGNOSIS — I998 Other disorder of circulatory system: Secondary | ICD-10-CM

## 2013-09-01 DIAGNOSIS — L97409 Non-pressure chronic ulcer of unspecified heel and midfoot with unspecified severity: Secondary | ICD-10-CM | POA: Insufficient documentation

## 2013-09-01 DIAGNOSIS — L98499 Non-pressure chronic ulcer of skin of other sites with unspecified severity: Principal | ICD-10-CM | POA: Insufficient documentation

## 2013-09-01 DIAGNOSIS — I70229 Atherosclerosis of native arteries of extremities with rest pain, unspecified extremity: Secondary | ICD-10-CM

## 2013-09-01 DIAGNOSIS — I999 Unspecified disorder of circulatory system: Secondary | ICD-10-CM

## 2013-09-01 DIAGNOSIS — I739 Peripheral vascular disease, unspecified: Secondary | ICD-10-CM | POA: Insufficient documentation

## 2013-09-01 NOTE — Progress Notes (Signed)
Lower arterial duplex limited completed. [place]

## 2013-09-06 ENCOUNTER — Ambulatory Visit (INDEPENDENT_AMBULATORY_CARE_PROVIDER_SITE_OTHER): Payer: Medicare Other | Admitting: Cardiovascular Disease

## 2013-09-06 ENCOUNTER — Encounter: Payer: Self-pay | Admitting: Cardiovascular Disease

## 2013-09-06 ENCOUNTER — Encounter (HOSPITAL_COMMUNITY): Payer: Self-pay | Admitting: Pharmacy Technician

## 2013-09-06 VITALS — BP 120/70 | HR 96 | Ht 69.0 in | Wt 185.0 lb

## 2013-09-06 DIAGNOSIS — D689 Coagulation defect, unspecified: Secondary | ICD-10-CM

## 2013-09-06 DIAGNOSIS — I998 Other disorder of circulatory system: Secondary | ICD-10-CM

## 2013-09-06 DIAGNOSIS — Z79899 Other long term (current) drug therapy: Secondary | ICD-10-CM

## 2013-09-06 DIAGNOSIS — I70229 Atherosclerosis of native arteries of extremities with rest pain, unspecified extremity: Secondary | ICD-10-CM

## 2013-09-06 DIAGNOSIS — Z01818 Encounter for other preprocedural examination: Secondary | ICD-10-CM

## 2013-09-06 DIAGNOSIS — R5383 Other fatigue: Secondary | ICD-10-CM

## 2013-09-06 DIAGNOSIS — R5381 Other malaise: Secondary | ICD-10-CM

## 2013-09-06 DIAGNOSIS — I999 Unspecified disorder of circulatory system: Secondary | ICD-10-CM

## 2013-09-06 LAB — BASIC METABOLIC PANEL
BUN: 24 mg/dL — AB (ref 6–23)
CO2: 27 mEq/L (ref 19–32)
Calcium: 9.6 mg/dL (ref 8.4–10.5)
Chloride: 101 mEq/L (ref 96–112)
Creat: 1.02 mg/dL (ref 0.50–1.35)
Glucose, Bld: 164 mg/dL — ABNORMAL HIGH (ref 70–99)
Potassium: 4.5 mEq/L (ref 3.5–5.3)
Sodium: 140 mEq/L (ref 135–145)

## 2013-09-06 LAB — CBC
HCT: 35.1 % — ABNORMAL LOW (ref 39.0–52.0)
HEMOGLOBIN: 11.5 g/dL — AB (ref 13.0–17.0)
MCH: 24.3 pg — AB (ref 26.0–34.0)
MCHC: 32.8 g/dL (ref 30.0–36.0)
MCV: 74.1 fL — AB (ref 78.0–100.0)
PLATELETS: 308 10*3/uL (ref 150–400)
RBC: 4.74 MIL/uL (ref 4.22–5.81)
RDW: 17.4 % — ABNORMAL HIGH (ref 11.5–15.5)
WBC: 6.9 10*3/uL (ref 4.0–10.5)

## 2013-09-06 LAB — TSH: TSH: 3.23 u[IU]/mL (ref 0.350–4.500)

## 2013-09-06 LAB — APTT: aPTT: 41 seconds — ABNORMAL HIGH (ref 24–37)

## 2013-09-06 LAB — PROTIME-INR
INR: 1.03 (ref <1.50)
PROTHROMBIN TIME: 13.4 s (ref 11.6–15.2)

## 2013-09-06 NOTE — Progress Notes (Signed)
09/06/2013 [name] [name]   18-Dec-1945  [id number]  Primary Physician [name] Screws, MD Primary Cardiologist: [name] Harp MD [name] [name]   HPI:  [name] [name] is a 68 year old married African American male referred by [name] [name] from the [place] [facility] care center for evaluation of critical limb ischemia. [name] [name] is his cardiologist in heart care. He has a history of nonischemic cardiac myopathy status post ICD implantation for primary prevention. His other problems include hypertension, insulin-dependent diabetes, chronic atrial fibrillation. He does not smoke. He denies chest pain or shortness of breath. He just saw [name] [name] in the office one month ago. He developed she also is back in August and had arterial Doppler studies the office suggesting widely patent arteries. Over last 2 weeks he developed left great toe nonhealing ulcer with what appears to be some dry gangrenous changes on the right great toe. His right heel ulcer has healed but he still has a reactive left and also with a wound VAC. I obtained arterial Dopplers on him 09/01/13 showed a left ABI of 0.44 with occluded posterior tibial and peroneal arteries. I suspect that he does not have an intact pedal arch and probably will not heal his right great toe ulcer without improvement in  Pedal blood flow. As a result I have scheduled him to undergo angiography and potential intervention for limb salvage.    Current Outpatient Prescriptions  Medication Sig Dispense Refill  . aspirin 325 MG tablet Take 325 mg by mouth daily.        [name] atorvastatin (LIPITOR) 10 MG tablet Take 10 mg by mouth daily.        . carvedilol (COREG) 12.5 MG tablet Take 12.5 mg by mouth 2 (two) times daily with a meal.      . doxycycline (VIBRA-TABS) 100 MG tablet Take 100 mg by mouth 2 (two) times daily.      [name] esomeprazole (NEXIUM) 40 MG capsule Take 40 mg by mouth as needed (acid reflux).       . furosemide (LASIX) 40  MG tablet Take 1 tablet (40 mg total) by mouth 2 (two) times daily.  60 tablet  1  . insulin detemir (LEVEMIR) 100 UNIT/ML injection Inject 45 Units into the skin at bedtime.      . metFORMIN (GLUCOPHAGE) 500 MG tablet Take 1 tablet by mouth 2 (two) times daily.      . Multiple Vitamins-Minerals (MULTIVITAMIN PO) Take 1 tablet by mouth daily.      [name] NOVOLOG 100 UNIT/ML injection Inject into the skin as needed.       . sitaGLIPtin (JANUVIA) 100 MG tablet Take 100 mg by mouth daily.      [name] spironolactone (ALDACTONE) 25 MG tablet Take 0.5 tablets (12.5 mg total) by mouth daily.  90 tablet  0  . valsartan (DIOVAN) 80 MG tablet Take 80 mg by mouth every morning.       No current facility-administered medications for this visit.    No Known Allergies  History   Social History  . Marital Status: Married    Spouse Name: N/A    Number of Children: N/A  . Years of Education: N/A   Occupational History  . Not on file.   Social History Main Topics  . Smoking status: Never Smoker   . Smokeless tobacco: Not on file  . Alcohol Use: No  . Drug Use: No  . Sexual Activity:    Other Topics  Concern  . Not on file   Social History Narrative  . No narrative on file     Review of Systems: General: negative for chills, fever, night sweats or weight changes.  Cardiovascular: negative for chest pain, dyspnea on exertion, edema, orthopnea, palpitations, paroxysmal nocturnal dyspnea or shortness of breath Dermatological: negative for rash Respiratory: negative for cough or wheezing Urologic: negative for hematuria Abdominal: negative for nausea, vomiting, diarrhea, bright red blood per rectum, melena, or hematemesis Neurologic: negative for visual changes, syncope, or dizziness All other systems reviewed and are otherwise negative except as noted above.    Blood pressure 120/70, pulse 96, height 5\' 9"  (1.753 m), weight 185 lb (83.915 kg).  General appearance: alert and no distress Neck: no  adenopathy, no carotid bruit, no JVD, supple, symmetrical, trachea midline and thyroid not enlarged, symmetric, no tenderness/mass/nodules Lungs: clear to auscultation bilaterally Heart: regular rate and rhythm, S1, S2 normal, no murmur, click, rub or gallop Extremities: both feet are bandaged  EKG not performed today  ASSESSMENT AND PLAN:   Critical lower limb ischemia [name] has critical limb ischemia with a gangrenous left great toe. Recent Dopplers revealed a left TBI 0.44 and occluded posterior tibial and peroneal arteries. Certainly possible that he does not have adequate perfusion to his pedal arch. He'll need peripheral angiography potential intervention for critical limb ischemia.      [name] Harp MD FACP,FACC,FAHA, [facility] 09/06/2013 9:22 AM

## 2013-09-06 NOTE — Patient Instructions (Signed)
Dr. Gwenlyn Found has ordered a peripheral angiogram to be done at [facility].  This procedure is going to look at the bloodflow in your lower extremities.  If Dr. Gwenlyn Found is able to open up the arteries, you will have to spend one night in the hospital.  If he is not able to open the arteries, you will be able to go home that same day.    After the procedure, you will not be allowed to drive for 3 days or push, pull, or lift anything greater than 10 lbs for one week.    You will be required to have bloodwork prior to your procedure.  Our scheduler will advise you on when this item needs to be done.  You do not need a chest xray.  REPS: Eric Right groin

## 2013-09-06 NOTE — Assessment & Plan Note (Signed)
Mr. Proto has critical limb ischemia with a gangrenous left great toe. Recent Dopplers revealed a left TBI 0.44 and occluded posterior tibial and peroneal arteries. Certainly possible that he does not have adequate perfusion to his pedal arch. He'll need peripheral angiography potential intervention for critical limb ischemia.

## 2013-09-12 ENCOUNTER — Encounter (HOSPITAL_COMMUNITY): Payer: Self-pay | Admitting: General Practice

## 2013-09-12 ENCOUNTER — Ambulatory Visit (HOSPITAL_COMMUNITY)
Admission: RE | Admit: 2013-09-12 | Discharge: 2013-09-13 | Disposition: A | Discharge status: Home / Self Care | Payer: Medicare Other | Source: Ambulatory Visit | Attending: Cardiovascular Disease | Admitting: Cardiovascular Disease

## 2013-09-12 ENCOUNTER — Encounter (HOSPITAL_COMMUNITY)
Admission: RE | Disposition: A | Discharge status: Home / Self Care | Payer: Self-pay | Source: Ambulatory Visit | Attending: Cardiovascular Disease

## 2013-09-12 DIAGNOSIS — I70219 Atherosclerosis of native arteries of extremities with intermittent claudication, unspecified extremity: Secondary | ICD-10-CM

## 2013-09-12 DIAGNOSIS — I739 Peripheral vascular disease, unspecified: Secondary | ICD-10-CM | POA: Insufficient documentation

## 2013-09-12 DIAGNOSIS — L97409 Non-pressure chronic ulcer of unspecified heel and midfoot with unspecified severity: Secondary | ICD-10-CM | POA: Insufficient documentation

## 2013-09-12 DIAGNOSIS — I480 Paroxysmal atrial fibrillation: Secondary | ICD-10-CM

## 2013-09-12 DIAGNOSIS — E119 Type 2 diabetes mellitus without complications: Secondary | ICD-10-CM | POA: Insufficient documentation

## 2013-09-12 DIAGNOSIS — I5042 Chronic combined systolic (congestive) and diastolic (congestive) heart failure: Secondary | ICD-10-CM | POA: Diagnosis present

## 2013-09-12 DIAGNOSIS — L98499 Non-pressure chronic ulcer of skin of other sites with unspecified severity: Principal | ICD-10-CM | POA: Insufficient documentation

## 2013-09-12 DIAGNOSIS — I5022 Chronic systolic (congestive) heart failure: Secondary | ICD-10-CM | POA: Insufficient documentation

## 2013-09-12 DIAGNOSIS — I70229 Atherosclerosis of native arteries of extremities with rest pain, unspecified extremity: Secondary | ICD-10-CM | POA: Diagnosis present

## 2013-09-12 DIAGNOSIS — L97509 Non-pressure chronic ulcer of other part of unspecified foot with unspecified severity: Secondary | ICD-10-CM | POA: Insufficient documentation

## 2013-09-12 DIAGNOSIS — Z01818 Encounter for other preprocedural examination: Secondary | ICD-10-CM

## 2013-09-12 DIAGNOSIS — I998 Other disorder of circulatory system: Secondary | ICD-10-CM | POA: Diagnosis present

## 2013-09-12 DIAGNOSIS — E785 Hyperlipidemia, unspecified: Secondary | ICD-10-CM | POA: Insufficient documentation

## 2013-09-12 DIAGNOSIS — I428 Other cardiomyopathies: Secondary | ICD-10-CM | POA: Diagnosis present

## 2013-09-12 DIAGNOSIS — I4891 Unspecified atrial fibrillation: Secondary | ICD-10-CM | POA: Insufficient documentation

## 2013-09-12 DIAGNOSIS — Z95810 Presence of automatic (implantable) cardiac defibrillator: Secondary | ICD-10-CM | POA: Insufficient documentation

## 2013-09-12 DIAGNOSIS — I10 Essential (primary) hypertension: Secondary | ICD-10-CM | POA: Insufficient documentation

## 2013-09-12 HISTORY — DX: Type 2 diabetes mellitus without complications: E11.9

## 2013-09-12 HISTORY — PX: ABDOMINAL ANGIOGRAM: SHX5499

## 2013-09-12 HISTORY — PX: TRANSLUMINAL ATHERECTOMY TIBIAL ARTERY: SHX2570

## 2013-09-12 HISTORY — DX: Peripheral vascular disease, unspecified: I73.9

## 2013-09-12 HISTORY — DX: Presence of automatic (implantable) cardiac defibrillator: Z95.810

## 2013-09-12 HISTORY — PX: LOWER EXTREMITY ANGIOGRAM: SHX5508

## 2013-09-12 HISTORY — DX: Paroxysmal atrial fibrillation: I48.0

## 2013-09-12 HISTORY — DX: Pure hypercholesterolemia, unspecified: E78.00

## 2013-09-12 LAB — GLUCOSE, CAPILLARY
GLUCOSE-CAPILLARY: 211 mg/dL — AB (ref 70–99)
GLUCOSE-CAPILLARY: 133 mg/dL — AB (ref 70–99)
GLUCOSE-CAPILLARY: 260 mg/dL — AB (ref 70–99)

## 2013-09-12 LAB — POCT ACTIVATED CLOTTING TIME
ACTIVATED CLOTTING TIME: 227 s
ACTIVATED CLOTTING TIME: 193 s
ACTIVATED CLOTTING TIME: 171 s
Activated Clotting Time: 199 seconds

## 2013-09-12 SURGERY — ANGIOGRAM, LOWER EXTREMITY
Anesthesia: LOCAL | Laterality: Left

## 2013-09-12 MED ORDER — LIDOCAINE HCL (PF) 1 % IJ SOLN
INTRAMUSCULAR | Status: AC
  Filled 2013-09-12: qty 30

## 2013-09-12 MED ORDER — MIDAZOLAM HCL 2 MG/2ML IJ SOLN
INTRAMUSCULAR | Status: AC
  Filled 2013-09-12: qty 2

## 2013-09-12 MED ORDER — DOXYCYCLINE HYCLATE 100 MG PO TABS
100.0000 mg | ORAL_TABLET | Freq: Two times a day (BID) | ORAL | Status: DC
  Administered 2013-09-12 – 2013-09-13 (×2): 100 mg via ORAL
  Filled 2013-09-12 (×4): qty 1

## 2013-09-12 MED ORDER — SPIRONOLACTONE 12.5 MG HALF TABLET
12.5000 mg | ORAL_TABLET | Freq: Every day | ORAL | Status: DC
  Administered 2013-09-13: 12.5 mg via ORAL
  Filled 2013-09-12 (×2): qty 1

## 2013-09-12 MED ORDER — INSULIN DETEMIR 100 UNIT/ML ~~LOC~~ SOLN
45.0000 [IU] | Freq: Every day | SUBCUTANEOUS | Status: DC
  Administered 2013-09-12: 22:00:00 45 [IU] via SUBCUTANEOUS
  Filled 2013-09-12 (×2): qty 0.45

## 2013-09-12 MED ORDER — CARVEDILOL 12.5 MG PO TABS
12.5000 mg | ORAL_TABLET | Freq: Two times a day (BID) | ORAL | Status: DC
  Administered 2013-09-12 – 2013-09-13 (×2): 12.5 mg via ORAL
  Filled 2013-09-12 (×3): qty 1

## 2013-09-12 MED ORDER — HYDRALAZINE HCL 20 MG/ML IJ SOLN: 10.0000 mg | INTRAMUSCULAR | Status: DC

## 2013-09-12 MED ORDER — CLOPIDOGREL BISULFATE 75 MG PO TABS
75.0000 mg | ORAL_TABLET | Freq: Every day | ORAL | Status: DC
  Administered 2013-09-13: 09:00:00 75 mg via ORAL
  Filled 2013-09-12: qty 1

## 2013-09-12 MED ORDER — ASPIRIN 81 MG PO CHEW
81.0000 mg | CHEWABLE_TABLET | ORAL | Status: AC
  Administered 2013-09-12: 81 mg via ORAL
  Filled 2013-09-12: qty 1

## 2013-09-12 MED ORDER — INSULIN ASPART 100 UNIT/ML ~~LOC~~ SOLN: 4.0000 [IU] | Freq: Once | SUBCUTANEOUS | Status: DC

## 2013-09-12 MED ORDER — ACETAMINOPHEN 325 MG PO TABS: 650.0000 mg | ORAL_TABLET | ORAL | Status: DC | PRN

## 2013-09-12 MED ORDER — ONDANSETRON HCL 4 MG/2ML IJ SOLN: 4.0000 mg | Freq: Four times a day (QID) | INTRAMUSCULAR | Status: DC | PRN

## 2013-09-12 MED ORDER — ASPIRIN EC 325 MG PO TBEC
325.0000 mg | DELAYED_RELEASE_TABLET | Freq: Every day | ORAL | Status: DC
  Administered 2013-09-13: 325 mg via ORAL
  Filled 2013-09-12: qty 1

## 2013-09-12 MED ORDER — FUROSEMIDE 40 MG PO TABS
40.0000 mg | ORAL_TABLET | Freq: Two times a day (BID) | ORAL | Status: DC
  Administered 2013-09-12 – 2013-09-13 (×2): 40 mg via ORAL
  Filled 2013-09-12 (×4): qty 1

## 2013-09-12 MED ORDER — IBUPROFEN 400 MG PO TABS
400.0000 mg | ORAL_TABLET | Freq: Four times a day (QID) | ORAL | Status: DC | PRN
  Administered 2013-09-12: 19:00:00 400 mg via ORAL
  Filled 2013-09-12 (×2): qty 1

## 2013-09-12 MED ORDER — VERAPAMIL HCL 2.5 MG/ML IV SOLN
INTRAVENOUS | Status: AC
  Filled 2013-09-12: qty 2

## 2013-09-12 MED ORDER — IRBESARTAN 150 MG PO TABS
150.0000 mg | ORAL_TABLET | Freq: Every day | ORAL | Status: DC
  Administered 2013-09-13: 09:00:00 150 mg via ORAL
  Filled 2013-09-12: qty 1

## 2013-09-12 MED ORDER — DIAZEPAM 5 MG PO TABS
5.0000 mg | ORAL_TABLET | ORAL | Status: AC
  Administered 2013-09-12: 5 mg via ORAL
  Filled 2013-09-12: qty 1

## 2013-09-12 MED ORDER — ATORVASTATIN CALCIUM 10 MG PO TABS
10.0000 mg | ORAL_TABLET | Freq: Every day | ORAL | Status: DC
  Administered 2013-09-12 – 2013-09-13 (×2): 10 mg via ORAL
  Filled 2013-09-12 (×2): qty 1

## 2013-09-12 MED ORDER — HEPARIN (PORCINE) IN NACL 2-0.9 UNIT/ML-% IJ SOLN
INTRAMUSCULAR | Status: AC
  Filled 2013-09-12: qty 1000

## 2013-09-12 MED ORDER — PANTOPRAZOLE SODIUM 40 MG PO TBEC
80.0000 mg | DELAYED_RELEASE_TABLET | Freq: Every day | ORAL | Status: DC
  Administered 2013-09-12 – 2013-09-13 (×2): 80 mg via ORAL
  Filled 2013-09-12 (×2): qty 2

## 2013-09-12 MED ORDER — ASPIRIN EC 81 MG PO TBEC: 81.0000 mg | DELAYED_RELEASE_TABLET | Freq: Every day | ORAL | Status: DC

## 2013-09-12 MED ORDER — MORPHINE SULFATE 2 MG/ML IJ SOLN: 1.0000 mg | INTRAMUSCULAR | Status: DC | PRN

## 2013-09-12 MED ORDER — SODIUM CHLORIDE 0.9 % IV SOLN: INTRAVENOUS | Status: AC

## 2013-09-12 MED ORDER — SODIUM CHLORIDE 0.9 % IV SOLN
INTRAVENOUS | Status: DC
  Administered 2013-09-12: 09:00:00 via INTRAVENOUS

## 2013-09-12 MED ORDER — HEPARIN SODIUM (PORCINE) 1000 UNIT/ML IJ SOLN
INTRAMUSCULAR | Status: AC
  Filled 2013-09-12: qty 1

## 2013-09-12 MED ORDER — FENTANYL CITRATE 0.05 MG/ML IJ SOLN
INTRAMUSCULAR | Status: AC
  Filled 2013-09-12: qty 2

## 2013-09-12 MED ORDER — SODIUM CHLORIDE 0.9 % IJ SOLN: 3.0000 mL | INTRAMUSCULAR | Status: DC | PRN

## 2013-09-12 NOTE — CV Procedure (Signed)
[name] [name] is a 68 y.o. male    [id number] LOCATION:  FACILITY: [facility]  PHYSICIAN: Quay Burow, M.D. February 10, 1946   DATE OF PROCEDURE:  09/12/2013  DATE OF DISCHARGE:     PV Angiogram/Intervention    History obtained from chart review.[name] [name] is a 68 year old married African American male referred by Dr. Judene Companion from the [facility] for evaluation of critical limb ischemia. Dr. Jolyn Nap is his cardiologist in heart care. He has a history of nonischemic cardiomyopathy status post ICD implantation for primary prevention. His other problems include hypertension, insulin-dependent diabetes, chronic atrial fibrillation. He does not smoke. He denies chest pain or shortness of breath. He just saw Dr. Caryl Comes in the office one month ago. He developed an Left great toe ulcer  back in August and had arterial Doppler studies in our office suggesting widely patent arteries. Over last 2 weeks he developed left great toe nonhealing ulcer with what appears to be some dry gangrenous changes on the right great toe. His right heel ulcer has healed but he still has a reactive left and also with a wound VAC. I obtained arterial Dopplers on him 09/01/13 showed a left ABI of 0.44 with occluded posterior tibial and peroneal arteries. I suspect that he does not have an intact pedal arch and probably will not heal his right great toe ulcer without improvement in Pedal blood flow. As a result I have scheduled him to undergo angiography and potential intervention for limb salvage.    PROCEDURE DESCRIPTION:   The patient was brought to the second floor [facility] lab in the postabsorptive state. He was premedicated with Valium 5 mg by mouth, IV Versed and fentanyl. His right groinwas prepped and shaved in usual sterile fashion. Xylocaine 1% was used for local anesthesia. A 5 French sheath was inserted into the recommend femoral artery using standard Seldinger technique.a 5  French pigtail catheter was placed in the distal abdominal aorta. Abdominal aortography, bilateral selective angiography with bifemoral runoff using posthaste digital subtraction settable technique was performed. Visipaque dosages for the entirety of the case. Retrograde aortic pressure was monitored during the case.  HEMODYNAMICS:    AO SYSTOLIC/AO DIASTOLIC: 093/26   Angiographic Data:   1: Abdominal aorta-the distal abdominal aorta was  free of significant atherosclerotic disease  2: Right lower extremity-there was three-vessel runoff with a commutative diffusely diseased posterior tibial and 90% segmental proximal anterior tibial artery stenosis  3: Left lower extremity-there was two-vessel runoff with occluded posterior tibial. The anterior tib fibula which is the dominant vessel to the foot had a segmental 90% stenosis in its midportion.    IMPRESSION:[name] [name] has critical limb ischemia and two-vessel runoff on the left with a 90% segmental mid left anterior tibial artery supplying his entire dorsal pedal arch. We'll proceed with diamondback orbital rotation arthrectomy plus or minus PTA for critical limb ischemia.  Procedure Description:contralateral access obtained with a crossover catheter and 0.35 Versed core wire. A 6 French/55 cm long Ansel  sheath was then advanced across the bifurcation the bifurcation and placed in the left common femoral artery. The patient received 8000 units of heparin intravenously with an ACT measured at 227. Total contrast administered the patient was 211 cc. A 014 Rigali wire was used along with an 0.18 CXI end hole  catheter to navigate the anterior tibial down to the level of the dorsalis pedis. Following this I exchanged for an 0.14 Viper wire . I performed  orbital rotational effect me with a 1.25 mm microcrown  at up to 120,000 rpm's. Following this I performed a prolonged inflation with a 3 mm x 4 cm chocolate balloon resulting in reduction of a 90%  segmental mid left anterior tibial artery stenosis to 0% residual without dissection and excellent flow.  Final Impression: successful diamondback orbital rotational atherectomy, PTA using chocolate balloon of high-grade mid left anterior tibial artery stenosis in the setting of critical limb ischemia with an occluded posterior tibial. The sheath was then withdrawn across the bifurcation and exchanged for a short 6 French sheath. This pull once the ACT T. Falls below 170 pressure will be held. The patient will be hydrated overnight, treated with aspirin Plavix and discharged him. He'll get followup lower extremity partial Doppler studies and see me back in the office.    Lorretta Harp MD, [facility] 09/12/2013 1:41 PM

## 2013-09-12 NOTE — Progress Notes (Signed)
Site area: right groin  Site Prior to Removal:  Level 0  Pressure Applied For 20 MINUTES    Minutes Beginning at 1540  Manual:   yes  Patient Status During Pull:  stable  Post Pull Groin Site:  Level 0  Post Pull Instructions Given:  yes  Post Pull Pulses Present:  yes  Dressing Applied:  yes  Comments:

## 2013-09-13 ENCOUNTER — Other Ambulatory Visit: Payer: Self-pay | Admitting: Nurse Practitioner

## 2013-09-13 ENCOUNTER — Encounter (HOSPITAL_COMMUNITY): Payer: Self-pay | Admitting: Nurse Practitioner

## 2013-09-13 DIAGNOSIS — E119 Type 2 diabetes mellitus without complications: Secondary | ICD-10-CM

## 2013-09-13 DIAGNOSIS — I739 Peripheral vascular disease, unspecified: Secondary | ICD-10-CM

## 2013-09-13 DIAGNOSIS — E785 Hyperlipidemia, unspecified: Secondary | ICD-10-CM

## 2013-09-13 DIAGNOSIS — I480 Paroxysmal atrial fibrillation: Secondary | ICD-10-CM

## 2013-09-13 DIAGNOSIS — I999 Unspecified disorder of circulatory system: Secondary | ICD-10-CM

## 2013-09-13 LAB — BASIC METABOLIC PANEL
BUN: 20 mg/dL (ref 6–23)
CO2: 23 mEq/L (ref 19–32)
CREATININE: 0.97 mg/dL (ref 0.50–1.35)
Calcium: 8.7 mg/dL (ref 8.4–10.5)
Chloride: 105 mEq/L (ref 96–112)
GFR, EST NON AFRICAN AMERICAN: 82 mL/min — AB (ref >90)
Glucose, Bld: 225 mg/dL — ABNORMAL HIGH (ref 70–99)
Potassium: 4.1 mEq/L (ref 3.7–5.3)
Sodium: 140 mEq/L (ref 137–147)

## 2013-09-13 LAB — CBC
HCT: 30.2 % — ABNORMAL LOW (ref 39.0–52.0)
Hemoglobin: 9.9 g/dL — ABNORMAL LOW (ref 13.0–17.0)
MCH: 24.4 pg — ABNORMAL LOW (ref 26.0–34.0)
MCHC: 32.8 g/dL (ref 30.0–36.0)
MCV: 74.6 fL — ABNORMAL LOW (ref 78.0–100.0)
PLATELETS: 284 10*3/uL (ref 150–400)
RBC: 4.05 MIL/uL — ABNORMAL LOW (ref 4.22–5.81)
RDW: 16.4 % — ABNORMAL HIGH (ref 11.5–15.5)
WBC: 5.8 10*3/uL (ref 4.0–10.5)

## 2013-09-13 LAB — GLUCOSE, CAPILLARY: Glucose-Capillary: 178 mg/dL — ABNORMAL HIGH (ref 70–99)

## 2013-09-13 MED ORDER — INSULIN ASPART 100 UNIT/ML ~~LOC~~ SOLN: 0.0000 [IU] | Freq: Three times a day (TID) | SUBCUTANEOUS | Status: DC

## 2013-09-13 MED ORDER — METFORMIN HCL 500 MG PO TABS: 500.0000 mg | ORAL_TABLET | Freq: Two times a day (BID) | ORAL | Status: DC

## 2013-09-13 MED ORDER — CLOPIDOGREL BISULFATE 75 MG PO TABS: 75.0000 mg | ORAL_TABLET | Freq: Every day | ORAL | Status: DC

## 2013-09-13 NOTE — Discharge Instructions (Signed)

## 2013-09-13 NOTE — Progress Notes (Signed)
     SUBJECTIVE: No complaints this am.   BP 107/53  Pulse 79  Temp(Src) 97.9 F (36.6 C) (Oral)  Resp 18  Ht 5\' 9"  (1.753 m)  Wt 192 lb 7.4 oz (87.3 kg)  BMI 28.41 kg/m2  SpO2 100%  Intake/Output Summary (Last 24 hours) at 09/13/13 1950 Last data filed at 09/13/13 9326  Gross per 24 hour  Intake 1241.25 ml  Output   1325 ml  Net -83.75 ml    PHYSICAL EXAM General: Well developed, well nourished, in no acute distress. Alert and oriented x 3.  Psych:  Good affect, responds appropriately Neck: No JVD. No masses noted.  Lungs: Clear bilaterally with no wheezes or rhonci noted.  Heart: RRR with no murmurs noted. Abdomen: Bowel sounds are present. Soft, non-tender.  Extremities: No lower extremity edema.   LABS: Basic Metabolic Panel:  Recent Labs  09/13/13 0450  NA 140  K 4.1  CL 105  CO2 23  GLUCOSE 225*  BUN 20  CREATININE 0.97  CALCIUM 8.7   CBC:  Recent Labs  09/13/13 0450  WBC 5.8  HGB 9.9*  HCT 30.2*  MCV 74.6*  PLT 284   Current Meds: . aspirin EC  325 mg Oral Daily  . atorvastatin  10 mg Oral Daily  . carvedilol  12.5 mg Oral BID WC  . clopidogrel  75 mg Oral Q breakfast  . doxycycline  100 mg Oral BID  . furosemide  40 mg Oral BID  . hydrALAZINE  10 mg Intravenous UD  . insulin aspart  4 Units Subcutaneous Once  . insulin detemir  45 Units Subcutaneous QHS  . irbesartan  150 mg Oral Daily  . pantoprazole  80 mg Oral Q1200  . spironolactone  12.5 mg Oral Daily   ASSESSMENT AND PLAN:  1. PAD/Critical Limb Ischemia:  Left toe ulcer prompting lower extremity angiogram 09/12/13. Now s/p left anterior tibial artery rotational orbital atherectomy and balloon angioplasty. He is stable this am. He will be discharged home today and have f/u planned with Dr. Gwenlyn Found in 2-3 weeks with partial Doppler studies of the lower extremities. He will be discharged home on ASA and Plavix. (Plavix is a new medication).   [name] [name]  1/20/20158:26  AM

## 2013-09-13 NOTE — Discharge Summary (Signed)
Discharge Summary   Patient ID: [name] [name],  MRN: [id number], DOB/AGE: 70-10-1945 70 y.o.  Admit date: 09/12/2013 Discharge date: 09/13/2013  Primary Care Provider: GATES,ROBERT NEVILL Primary Cardiologist: Adora Fridge, MD / S. Caryl Comes, MD   Discharge Diagnoses Principal Problem:   Critical lower limb ischemia/PAD  **Status post PTA of the left anterior tibial artery this admission.  Active Problems:   CARDIOMYOPATHY, PRIMARY, DILATED   SYSTOLIC HEART FAILURE, CHRONIC   Diabetes mellitus   HTN (hypertension)   Biventricular implantable cardioverter-defibrillator in situ   Hyperlipidemia   PAF (paroxysmal atrial fibrillation)  Allergies No Known Allergies  Procedures  Peripheral Angiography with Percutaneous Transluminal Angioplasty 1.19.2015  HEMODYNAMICS:     AO SYSTOLIC/AO DIASTOLIC: 017/49    Angiographic Data:   1: Abdominal aorta-the distal abdominal aorta was  free of significant atherosclerotic disease 2: Right lower extremity-there was three-vessel runoff with a commutative diffusely diseased posterior tibial and 90% segmental proximal anterior tibial artery stenosis 3: Left lower extremity-there was two-vessel runoff with occluded posterior tibial. The anterior tib fibula which is the dominant vessel to the foot had a segmental 90% stenosis in its midportion.   **Successful diamondback orbital rotational atherectomy, PTA using chocolate balloon of high-grade mid left anterior tibial artery stenosis in the setting of critical limb ischemia with an occluded posterior tibial.** _____________   History of Present Illness  68 year old male with prior history of nonischemic cardiomyopathy status post AICD, hypertension, diabetes mellitus, paroxysmal atrial fibrillation, and tobacco abuse who was recently seen in peripheral vascular clinic secondary to a nonhealing ulcer on the left great toe and left heel. Lower extremity Dopplers were performed and arterial  radial indices on the left was found to be 0.44. Decision was made to perform peripheral angiography.  Hospital Course  Patient presented to the [facility] peripheral angiography laboratory on January 19 and underwent peripheral angiography with runoff. This revealed an occluded left posterior tibial artery and severe, 90% stenosis in the midportion of the anterior tibial artery. This was felt to be the culprit for his critical limb ischemia and he therefore underwent successful diamondback orbital rotational atherectomy and balloon angioplasty within the left anterior tibial artery. Patient tolerated this procedure well and has been ambulating this morning without claudication. He will be discharged home today in good condition and arrangements have been made for followup arterial dopplers in the outpatient setting.   Discharge Vitals Blood pressure 107/53, pulse 79, temperature 97.9 F (36.6 C), temperature source Oral, resp. rate 18, height 5\' 9"  (1.753 m), weight 192 lb 7.4 oz (87.3 kg), SpO2 100.00%.  Filed Weights   09/12/13 0830 09/13/13 0624  Weight: 188 lb (85.276 kg) 192 lb 7.4 oz (87.3 kg)   Labs  CBC  Recent Labs  09/13/13 0450  WBC 5.8  HGB 9.9*  HCT 30.2*  MCV 74.6*  PLT 449   Basic Metabolic Panel  Recent Labs  09/13/13 0450  NA 140  K 4.1  CL 105  CO2 23  GLUCOSE 225*  BUN 20  CREATININE 0.97  CALCIUM 8.7   Disposition  Pt is being discharged home today in good condition.  Follow-up Plans & Appointments  Follow-up Information   Follow up with Erlene Quan, PA-C On 09/27/2013. (8:20 AM)    Specialty:  Cardiology   Contact information:   [address] [place] [address] [phone]       Follow up with [name], MD. (as scheduled)    Specialty:  Internal  Medicine   Contact information:   [address] [place] [place] [address] [phone]       Follow up with [facility] On  09/21/2013. (Arterial Dopplers - 8AM)    Contact information:   [address] [place] [address] [phone]     Discharge Medications    Medication List         aspirin EC 81 MG tablet  Take 81 mg by mouth daily.     atorvastatin 10 MG tablet  Commonly known as:  LIPITOR  Take 10 mg by mouth daily.     carvedilol 12.5 MG tablet  Commonly known as:  COREG  Take 12.5 mg by mouth 2 (two) times daily with a meal.     clopidogrel 75 MG tablet  Commonly known as:  PLAVIX  Take 1 tablet (75 mg total) by mouth daily with breakfast.     doxycycline 100 MG tablet  Commonly known as:  VIBRA-TABS  Take 100 mg by mouth 2 (two) times daily. Toe wound. Started on 09/02/13 for 15 days.     esomeprazole 40 MG capsule  Commonly known as:  NEXIUM  Take 40 mg by mouth daily as needed (acid reflux).     furosemide 40 MG tablet  Commonly known as:  LASIX  Take 1 tablet (40 mg total) by mouth 2 (two) times daily.     ibuprofen 200 MG tablet  Commonly known as:  ADVIL,MOTRIN  Take 400 mg by mouth every 6 (six) hours as needed for mild pain.     insulin detemir 100 UNIT/ML injection  Commonly known as:  LEVEMIR  Inject 45 Units into the skin at bedtime.     metFORMIN 500 MG tablet  Commonly known as:  GLUCOPHAGE  Take 1 tablet (500 mg total) by mouth 2 (two) times daily.     MULTIVITAMIN PO  Take 1 tablet by mouth daily.     NOVOLOG 100 UNIT/ML injection  Generic drug:  insulin aspart  Inject 4 Units into the skin as needed.     spironolactone 25 MG tablet  Commonly known as:  ALDACTONE  Take 0.5 tablets (12.5 mg total) by mouth daily.     valsartan 80 MG tablet  Commonly known as:  DIOVAN  Take 80 mg by mouth every morning.     vitamin E 1000 UNIT capsule  Generic drug:  vitamin E  Take 1,000 Units by mouth daily.       Outstanding Labs/Studies  Arterial doppler as above.  Duration of Discharge Encounter   Greater than 30 minutes including  physician time.  Signed, Murray Hodgkins NP 09/13/2013, 10:41 AM

## 2013-09-14 NOTE — Discharge Summary (Signed)
See full note.cdm 

## 2013-09-16 ENCOUNTER — Telehealth: Payer: Self-pay | Admitting: Cardiovascular Disease

## 2013-09-16 NOTE — Telephone Encounter (Signed)
Returned call and pt verified x 2.  Pt c/o pain from his L foot to his knee.  Denied redness, warmness or tenderness to touch.  Pt denied pain at groin site.  Rated pain 5/10 on pain scale.  Stated he took two Tylenol yesterday and the pain eased off.  Pt advised to take otc Tylenol as directed on label and alternate w/ IBU 400 mg q8h for the next 48 hours.  Pt advised IBU is only temporary as he is already on Plavix and ASA and advised to take w/ food.  Pt verbalized understanding and agreed w/ plan.  Dr. Debara Pickett notified and agreed w/ plan.

## 2013-09-16 NOTE — Telephone Encounter (Signed)
Had a stent placed in on Monday and is in some pain and wants to know if some pain medication can be called for him mto [facility] on [address] .[name] Please Call   Thanks

## 2013-09-20 ENCOUNTER — Encounter: Payer: Self-pay | Admitting: Cardiovascular Disease

## 2013-09-21 ENCOUNTER — Encounter: Payer: Self-pay | Admitting: Cardiology

## 2013-09-21 ENCOUNTER — Ambulatory Visit (INDEPENDENT_AMBULATORY_CARE_PROVIDER_SITE_OTHER): Payer: Medicare Other | Admitting: Cardiology

## 2013-09-21 ENCOUNTER — Ambulatory Visit (HOSPITAL_COMMUNITY)
Admit: 2013-09-21 | Discharge: 2013-09-21 | Disposition: A | Discharge status: Home / Self Care | Payer: Medicare Other | Source: Ambulatory Visit | Attending: Cardiovascular Disease | Admitting: Cardiovascular Disease

## 2013-09-21 VITALS — BP 110/60 | HR 88 | Ht 69.0 in | Wt 181.0 lb

## 2013-09-21 DIAGNOSIS — I739 Peripheral vascular disease, unspecified: Secondary | ICD-10-CM

## 2013-09-21 DIAGNOSIS — I428 Other cardiomyopathies: Secondary | ICD-10-CM

## 2013-09-21 DIAGNOSIS — R52 Pain, unspecified: Secondary | ICD-10-CM | POA: Insufficient documentation

## 2013-09-21 DIAGNOSIS — I70209 Unspecified atherosclerosis of native arteries of extremities, unspecified extremity: Secondary | ICD-10-CM | POA: Insufficient documentation

## 2013-09-21 DIAGNOSIS — E1169 Type 2 diabetes mellitus with other specified complication: Secondary | ICD-10-CM | POA: Insufficient documentation

## 2013-09-21 DIAGNOSIS — L97509 Non-pressure chronic ulcer of other part of unspecified foot with unspecified severity: Secondary | ICD-10-CM | POA: Insufficient documentation

## 2013-09-21 DIAGNOSIS — I70219 Atherosclerosis of native arteries of extremities with intermittent claudication, unspecified extremity: Secondary | ICD-10-CM

## 2013-09-21 DIAGNOSIS — I96 Gangrene, not elsewhere classified: Secondary | ICD-10-CM

## 2013-09-21 HISTORY — DX: Gangrene, not elsewhere classified: I96

## 2013-09-21 MED ORDER — HYDROCODONE-ACETAMINOPHEN 5-300 MG PO TABS: 1.0000 | ORAL_TABLET | ORAL | Status: DC | PRN

## 2013-09-21 NOTE — Assessment & Plan Note (Signed)
Asked to see pt for severe pain of toe, not relieved with tylenol.  He is followed at wound clinic but does not see them until Friday.  I ordered Vicodin 1 every 4 hours as needed for pain.  I explained that we would not re-order he would need to get meds from wound center MD.

## 2013-09-21 NOTE — Assessment & Plan Note (Signed)
No SOB, Lungs clear.

## 2013-09-21 NOTE — Assessment & Plan Note (Signed)
PTA with chocolate balloon to Lt ant tib.  08/2012 Residual 90% Rt ant tib stenosis

## 2013-09-21 NOTE — Patient Instructions (Signed)
I ordered vicodin for pain it has tylenol in it so do not take tylenol if using the vicodin.   It may make you drowsy.  I cannot refill this so have wound care MD re-order.  Follow up with Kerin Ransom as previously arranged.

## 2013-09-21 NOTE — Assessment & Plan Note (Signed)
Followed by Wound center.

## 2013-09-21 NOTE — Progress Notes (Signed)
Left lower ext. Arterial Duplex Completed. Oda Cogan, BS, RDMS, RVT

## 2013-09-21 NOTE — Progress Notes (Signed)
09/21/2013   PCP: [name], MD   Chief Complaint  Patient presents with  . Pain    pain in left lower leg ; pt. is requesting stronger pain medication    Primary Cardiologist: Dr. Gwenlyn Found  HPI:  68 year old male with prior history of nonischemic cardiomyopathy status post AICD, hypertension, diabetes mellitus, paroxysmal atrial fibrillation, and tobacco abuse who was recently seen in peripheral vascular clinic secondary to a nonhealing ulcer on the left great toe and left heel. Lower extremity Dopplers were performed and arterial radial indices on the left was found to be 0.44. Decision was made to perform peripheral angiography.  This revealed an occluded left posterior tibial artery and severe, 90% stenosis in the midportion of the anterior tibial artery. This was felt to be the culprit for his critical limb ischemia and he therefore underwent successful diamondback orbital rotational atherectomy and balloon angioplasty within the left anterior tibial artery.    Right lower extremity-there was three-vessel runoff with a commutative diffusely diseased posterior tibial and 90% segmental proximal anterior tibial artery stenosis that is treated medically.    Today he is seen for pain control.  He was here having dopplers and complained of toe from Lt foot at site of gangrene.  Tylenol is not helping. He asked to be seen for pain.  I did explain I could prescribe enough until he could see wound care MD for further treatment.  No Known Allergies  Current Outpatient Prescriptions  Medication Sig Dispense Refill  . aspirin EC 81 MG tablet Take 81 mg by mouth daily.      [name] atorvastatin (LIPITOR) 10 MG tablet Take 10 mg by mouth daily.        . carvedilol (COREG) 12.5 MG tablet Take 12.5 mg by mouth 2 (two) times daily with a meal.      . clopidogrel (PLAVIX) 75 MG tablet Take 1 tablet (75 mg total) by mouth daily with breakfast.  30 tablet  6  . doxycycline  (VIBRA-TABS) 100 MG tablet Take 100 mg by mouth 2 (two) times daily. Toe wound. Started on 09/02/13 for 15 days.      [name] esomeprazole (NEXIUM) 40 MG capsule Take 40 mg by mouth daily as needed (acid reflux).       . furosemide (LASIX) 40 MG tablet Take 1 tablet (40 mg total) by mouth 2 (two) times daily.  60 tablet  1  . ibuprofen (ADVIL,MOTRIN) 200 MG tablet Take 400 mg by mouth every 6 (six) hours as needed for mild pain.      [name] insulin detemir (LEVEMIR) 100 UNIT/ML injection Inject 45 Units into the skin at bedtime.      . metFORMIN (GLUCOPHAGE) 500 MG tablet Take 1 tablet (500 mg total) by mouth 2 (two) times daily.      . Multiple Vitamins-Minerals (MULTIVITAMIN PO) Take 1 tablet by mouth daily.      [name] NOVOLOG 100 UNIT/ML injection Inject 4 Units into the skin as needed.       [name] spironolactone (ALDACTONE) 25 MG tablet Take 0.5 tablets (12.5 mg total) by mouth daily.  90 tablet  0  . valsartan (DIOVAN) 80 MG tablet Take 80 mg by mouth every morning.      . vitamin E (VITAMIN E) 1000 UNIT capsule Take 1,000 Units by mouth daily.      . Hydrocodone-Acetaminophen 5-300 MG TABS Take 1 tablet by mouth every 4 (four) hours as needed (  for toe pain).  40 each  0   No current facility-administered medications for this visit.    Past Medical History  Diagnosis Date  . GERD (gastroesophageal reflux disease)   . Obesity   . Cardiomyopathy, nonischemic     a. Cath 2003: mild nonobstructive CAD, EF 25% at that time.  . Hypertension   . Chronic systolic CHF (congestive heart failure)     a. NICM EF 25% dating back to at least 2003.  [name] PAF (paroxysmal atrial fibrillation)     a. Noted on ICD interrogation 2012;  b. coumadin d/c'd 01/2013.  [name] NSVT (nonsustained ventricular tachycardia)     a. Noted on ICD interrogation in 2011.  [name] LBBB (left bundle branch block)   . CAD (coronary artery disease)     a. Nonobstructive by cath 09/2001.  [name] Kidney stone   . Dementia   . Critical lower limb ischemia   .  Automatic implantable cardioverter-defibrillator in situ     a. 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  . High cholesterol   . PAD (peripheral artery disease)     a. 08/2013 Periph Angio/PTA: Abd Ao nl, RLE- 3v runoff, PT diff dzs, AT 90p, LLE 2v runoff, PT 100, AT 33m (diamondback ORA/chocolate balloon PTA).  . Type II diabetes mellitus   . Uncontrolled pain, Lt toe 09/21/2013  . Gangrenous toe, Lt toe 09/21/2013    Past Surgical History  Procedure Laterality Date  . Cardiac catheterization  10/01/2001    THERE WAS GLOBAL HYPOKINESIS AND EF 25%. THERE APPEARED TO BE GLOBAL DECREASE IN WALL MOTION  . Lithotripsy  2001  . Cervical spine surgery  1994  . Cardiac defibrillator placement  06/2009    WITH GENERATOR REPLACED; BiV ICD  . US echocardiography  03/21/2008    EF 30-35%  . Cardiovascular stress test  03/20/2009    EF 33%  . Transluminal atherectomy tibial artery Left 09/12/2013    GBT:DVVOHYW:VP colds or fevers, no weight changes CV:see HPI PUL:see HPI GI:no diarrhea constipation or melena, no indigestion GU:no hematuria, no dysuria MS:no joint pain,  Claudication much improved, Lt foot wrapped some drainage Neuro:no syncope, no lightheadedness Endo:+ diabetes, no thyroid disease  PHYSICAL EXAM BP 110/60  Pulse 88  Ht 5\' 9"  (1.753 m)  Wt 181 lb (82.101 kg)  BMI 26.72 kg/m2 General:Pleasant affect, NAD Skin:Warm and dry, brisk capillary refill HEENT:normocephalic, sclera clear, mucus membranes moist Heart:S1S2 RRR without murmur, gallup, rub or click Lungs:clear without rales, rhonchi, or wheezes XTG:GYIR, non tender, + BS, do not palpate liver spleen or masses Ext:+ Lt 1+ lower ext edema, leg feels better with elevation, Lt foot is wrapped, + foul smell from wound. Neuro:alert and oriented, MAE, follows commands, + facial symmetry   ASSESSMENT AND PLAN Uncontrolled pain, Lt toe Asked to see pt for severe pain of toe, not relieved with  tylenol.  He is followed at wound clinic but does not see them until Friday.  I ordered Vicodin 1 every 4 hours as needed for pain.  I explained that we would not re-order he would need to get meds from wound center MD.  Gangrenous toe, Lt toe Followed by Wound center.   PAD (peripheral artery disease) PTA with chocolate balloon to Lt ant tib.  08/2012 Residual 90% Rt ant tib stenosis  CARDIOMYOPATHY, PRIMARY, DILATED No SOB, Lungs clear.

## 2013-09-23 ENCOUNTER — Telehealth: Payer: Self-pay | Admitting: Cardiovascular Disease

## 2013-09-23 NOTE — Telephone Encounter (Signed)
SPOKE TO WIFE. SHE STATES THEY ARE AT DR Phoebe Sharps OFFICE.  THE PROCEDURE HAS NOT BEEN SCHEDULE AWAITING FOR CLEARANCE.     WIFE IS AWARE DR BERRY IS OUT OF OFFICE UNTIL 09/27/13

## 2013-09-23 NOTE — Telephone Encounter (Signed)
Having problems with the plavix and is making him sick . Having a procedure to remove 2 of his toes and need to know if he can come off his plavix .[name] Please call at [phone]

## 2013-09-27 ENCOUNTER — Ambulatory Visit: Payer: Medicare Other | Admitting: Cardiology

## 2013-09-27 NOTE — Telephone Encounter (Signed)
Pt's wife called wanting to know if Dr. Gwenlyn Found took care of [name] [name] clearance.   JB

## 2013-09-27 NOTE — Telephone Encounter (Signed)
Message forwarded to K. Vogel, RN.  

## 2013-09-28 ENCOUNTER — Telehealth: Payer: Self-pay | Admitting: Internal Medicine

## 2013-09-28 ENCOUNTER — Telehealth: Payer: Self-pay | Admitting: Cardiovascular Disease

## 2013-09-28 NOTE — Telephone Encounter (Signed)
New Problem:  Pt states Dr Erlinda Hong at [facility] needs clearance to remove two of the pt's toes. Pt's wife would like a call back on the status.

## 2013-09-28 NOTE — Telephone Encounter (Signed)
Message forwarded to K. Vogel, RN.  

## 2013-09-28 NOTE — Telephone Encounter (Signed)
I spoke with Dr Gwenlyn Found.  Mr Telfair can hold his plavix if needed.  Please defer cardiac clearance to Dr Caryl Comes.  He is the cardiologist.

## 2013-09-28 NOTE — Telephone Encounter (Signed)
[name] [name] is aware that I have reached out to Dr Olin Pia office

## 2013-09-28 NOTE — Telephone Encounter (Signed)
He need Dr Gwenlyn Found to fax over his clarence for surgery to [facility] Dr Erlinda Hong please.

## 2013-09-29 ENCOUNTER — Encounter: Payer: Self-pay | Admitting: *Deleted

## 2013-09-29 ENCOUNTER — Other Ambulatory Visit (HOSPITAL_COMMUNITY): Payer: Self-pay | Admitting: Orthopaedic Surgery

## 2013-09-29 ENCOUNTER — Encounter (HOSPITAL_COMMUNITY): Payer: Self-pay | Admitting: Pharmacy Technician

## 2013-09-29 NOTE — Telephone Encounter (Addendum)
Informed wife I would be faxing over clearance to [facility] (Dr Erlinda Hong)  today. Per Caryl Comes - ok to proceed with surgery, Xarelto can be stopped 36 hour prior to procedure. Wife verbalized understanding.

## 2013-09-29 NOTE — Telephone Encounter (Signed)
Follow up    Have we faxed clearance to [facility] to remove two toes. Pt has gangreen in the toes and need the surgery soon.  Pls call wife

## 2013-09-30 ENCOUNTER — Encounter (HOSPITAL_COMMUNITY): Payer: Self-pay

## 2013-09-30 ENCOUNTER — Telehealth: Payer: Self-pay | Admitting: Internal Medicine

## 2013-09-30 ENCOUNTER — Other Ambulatory Visit (HOSPITAL_COMMUNITY): Payer: Medicare Other

## 2013-09-30 ENCOUNTER — Telehealth: Payer: Self-pay | Admitting: Cardiovascular Disease

## 2013-09-30 ENCOUNTER — Encounter (HOSPITAL_COMMUNITY)
Admission: RE | Admit: 2013-09-30 | Discharge: 2013-09-30 | Disposition: A | Discharge status: Home / Self Care | Payer: Medicare Other | Source: Ambulatory Visit | Attending: Orthopaedic Surgery | Admitting: Orthopaedic Surgery

## 2013-09-30 DIAGNOSIS — Z01812 Encounter for preprocedural laboratory examination: Secondary | ICD-10-CM | POA: Insufficient documentation

## 2013-09-30 LAB — CBC
HCT: 33.4 % — ABNORMAL LOW (ref 39.0–52.0)
HEMOGLOBIN: 11 g/dL — AB (ref 13.0–17.0)
MCH: 24.4 pg — ABNORMAL LOW (ref 26.0–34.0)
MCHC: 32.9 g/dL (ref 30.0–36.0)
MCV: 74.2 fL — ABNORMAL LOW (ref 78.0–100.0)
Platelets: 314 10*3/uL (ref 150–400)
RBC: 4.5 MIL/uL (ref 4.22–5.81)
RDW: 15.7 % — AB (ref 11.5–15.5)
WBC: 7.3 10*3/uL (ref 4.0–10.5)

## 2013-09-30 LAB — BASIC METABOLIC PANEL
BUN: 24 mg/dL — AB (ref 6–23)
CHLORIDE: 99 meq/L (ref 96–112)
CO2: 24 mEq/L (ref 19–32)
Calcium: 9.7 mg/dL (ref 8.4–10.5)
Creatinine, Ser: 1.13 mg/dL (ref 0.50–1.35)
GFR calc non Af Amer: 64 mL/min — ABNORMAL LOW (ref >90)
GFR, EST AFRICAN AMERICAN: 75 mL/min — AB (ref >90)
GLUCOSE: 153 mg/dL — AB (ref 70–99)
POTASSIUM: 4.3 meq/L (ref 3.7–5.3)
Sodium: 141 mEq/L (ref 137–147)

## 2013-09-30 NOTE — Pre-Procedure Instructions (Signed)
[place]  09/30/2013   Your procedure is scheduled on:   Tuesday  10/04/13   Report to [facility]  2 * 3 at 530 AM.  Call this number if you have problems the morning of surgery: [phone]   Remember:   Do not eat food or drink liquids after midnight.   Take these medicines the morning of surgery with A SIP OF WATER:  COREG (CARVEDILOL), NEXIUM, HYDROCODONE   Do not wear jewelry, make-up or nail polish.  Do not wear lotions, powders, or perfumes. You may wear deodorant.  Do not shave 48 hours prior to surgery. Men may shave face and neck.  Do not bring valuables to the hospital.  [facility] is not responsible                  for any belongings or valuables.               Contacts, dentures or bridgework may not be worn into surgery.  Leave suitcase in the car. After surgery it may be brought to your room.  For patients admitted to the hospital, discharge time is determined by your                treatment team.               Patients discharged the day of surgery will not be allowed to drive  home.  Name and phone number of your driver:   Special Instructions:  Special Instructions: [place] - Preparing for Surgery  Before surgery, you can play an important role.  Because skin is not sterile, your skin needs to be as free of germs as possible.  You can reduce the number of germs on you skin by washing with CHG (chlorahexidine gluconate) soap before surgery.  CHG is an antiseptic cleaner which kills germs and bonds with the skin to continue killing germs even after washing.  Please DO NOT use if you have an allergy to CHG or antibacterial soaps.  If your skin becomes reddened/irritated stop using the CHG and inform your nurse when you arrive at Short Stay.  Do not shave (including legs and underarms) for at least 48 hours prior to the first CHG shower.  You may shave your face.  Please follow these instructions carefully:   1.  Shower with CHG  Soap the night before surgery and the morning of Surgery.  2.  If you choose to wash your hair, wash your hair first as usual with your normal shampoo.  3.  After you shampoo, rinse your hair and body thoroughly to remove the Shampoo.  4.  Use CHG as you would any other liquid soap. You can apply chg directly to the skin and wash gently with scrungie or a clean washcloth.  5.  Apply the CHG Soap to your body ONLY FROM THE NECK DOWN.  Do not use on open wounds or open sores.  Avoid contact with your eyes, ears, mouth and genitals (private parts).  Wash genitals (private parts with your normal soap.  6.  Wash thoroughly, paying special attention to the area where your surgery will be performed.  7.  Thoroughly rinse your body with warm water from the neck down.  8.  DO NOT shower/wash with your normal soap after using and rinsing off the CHG Soap.  9.  Pat yourself dry with a clean towel.  10.  Wear clean pajamas.            11.  Place clean sheets on your bed the night of your first shower and do not sleep with pets.  Day of Surgery  Do not apply any lotions/deodorants the morning of surgery.  Please wear clean clothes to the hospital/surgery center.   Please read over the following fact sheets that you were given: Pain Booklet, Coughing and Deep Breathing and Surgical Site Infection Prevention

## 2013-09-30 NOTE — Progress Notes (Signed)
09/30/13 1024  OBSTRUCTIVE SLEEP APNEA  Have you ever been diagnosed with sleep apnea through a sleep study? No  Do you snore loudly (loud enough to be heard through closed doors)?  1  Do you often feel tired, fatigued, or sleepy during the daytime? 1  Has anyone observed you stop breathing during your sleep? 1  Do you have, or are you being treated for high blood pressure? 1  BMI more than 35 kg/m2? 0  Age over 70 years old? 1  Neck circumference greater than 40 cm/18 inches? 0  Gender: 1  Obstructive Sleep Apnea Score 6  Score 4 or greater  Results sent to PCP

## 2013-09-30 NOTE — Telephone Encounter (Signed)
New message     Pt needs a procedure.  Can he stop his plavix?

## 2013-09-30 NOTE — Telephone Encounter (Signed)
INFORMED  SHORT STAY TO  ASK  DR  BERRY   IF  PT  MAY  HOLD  PLAVIX .Adonis Housekeeper

## 2013-09-30 NOTE — Progress Notes (Signed)
PER AMBER AT. DR. Kennon Holter OFFICE A NOTE WAS WRITTEN THAT WAS OKAY FOR PATIENT TO STOP ASPIRIN AND  PLAVIX.

## 2013-09-30 NOTE — Progress Notes (Signed)
PER SHERRI  AT DR. Phoebe Sharps OFFICE PATIENT SHOULD STOP ASPIRIN.  PATIENT WAS ALREADY  INSTRUCTED TO STOP PLAVIX BY DR. Kennon Holter OFFICE.

## 2013-09-30 NOTE — Telephone Encounter (Signed)
Call from [place] w/ Short Stay at [facility].  Stated pt was given clearance to hold Xarelto for procedure, but is taking Plavix.   Wanted to know if pt can hold Plavix.  RN reviewed notes and on 2.4.15, Curt Bears, RN documented Dr. Gwenlyn Found stated pt can hold Plavix, but to defer clearance to Dr. Olin Pia office.  That same day , Dr. Olin Pia office documented pt was cleared and could hold Xarelto.  Sonia Baller advised to contact Dr. Olin Pia office as they cleared pt to hold Xarelto.  Sonia Baller stated she had already called their office and they said to call Dr. Gwenlyn Found.  Sonia Baller advised to review phone note on 2.4.15 for the information she needed as Dr. Gwenlyn Found had already given permission for pt hold Plavix.  Informed Dr. Olin Pia office completed clearance form per records and would need to be corrected through his office.

## 2013-10-03 ENCOUNTER — Encounter: Payer: Self-pay | Admitting: *Deleted

## 2013-10-03 ENCOUNTER — Telehealth: Payer: Self-pay | Admitting: *Deleted

## 2013-10-03 DIAGNOSIS — I739 Peripheral vascular disease, unspecified: Secondary | ICD-10-CM

## 2013-10-03 MED ORDER — CEFAZOLIN SODIUM-DEXTROSE 2-3 GM-% IV SOLR
2.0000 g | INTRAVENOUS | Status: AC
  Administered 2013-10-04: 2 g via INTRAVENOUS
  Filled 2013-10-03: qty 50

## 2013-10-03 NOTE — Telephone Encounter (Signed)
Order placed for repeat lower ext dopplers in 6  months 

## 2013-10-03 NOTE — Telephone Encounter (Signed)
Message copied by Chauncy Lean on Mon Oct 03, 2013  7:46 PM ------      Message from: Lorretta Harp      Created: Thu Sep 29, 2013  7:41 PM       Improvement s/p Left AT intervention. Repeat 6 months ------

## 2013-10-03 NOTE — Telephone Encounter (Signed)
Dr Olin Pia office gave surgical clearance.

## 2013-10-04 ENCOUNTER — Encounter (HOSPITAL_COMMUNITY): Payer: Medicare Other | Admitting: Vascular Surgery

## 2013-10-04 ENCOUNTER — Ambulatory Visit (HOSPITAL_COMMUNITY): Payer: Medicare Other | Admitting: Critical Care Medicine

## 2013-10-04 ENCOUNTER — Encounter (HOSPITAL_COMMUNITY)
Admission: RE | Disposition: A | Discharge status: Skilled Nursing Facility | Payer: Self-pay | Source: Ambulatory Visit | Attending: Orthopaedic Surgery

## 2013-10-04 ENCOUNTER — Inpatient Hospital Stay (HOSPITAL_COMMUNITY)
Admission: RE | Admit: 2013-10-04 | Discharge: 2013-10-07 | DRG: 617 | Disposition: A | Discharge status: Skilled Nursing Facility | Payer: Medicare Other | Source: Ambulatory Visit | Attending: Orthopaedic Surgery | Admitting: Orthopaedic Surgery

## 2013-10-04 ENCOUNTER — Encounter (HOSPITAL_COMMUNITY): Payer: Self-pay | Admitting: Critical Care Medicine

## 2013-10-04 DIAGNOSIS — E119 Type 2 diabetes mellitus without complications: Secondary | ICD-10-CM

## 2013-10-04 DIAGNOSIS — M86672 Other chronic osteomyelitis, left ankle and foot: Secondary | ICD-10-CM | POA: Diagnosis present

## 2013-10-04 DIAGNOSIS — L89609 Pressure ulcer of unspecified heel, unspecified stage: Secondary | ICD-10-CM | POA: Diagnosis present

## 2013-10-04 DIAGNOSIS — Z79899 Other long term (current) drug therapy: Secondary | ICD-10-CM

## 2013-10-04 DIAGNOSIS — L97509 Non-pressure chronic ulcer of other part of unspecified foot with unspecified severity: Secondary | ICD-10-CM | POA: Diagnosis present

## 2013-10-04 DIAGNOSIS — I428 Other cardiomyopathies: Secondary | ICD-10-CM | POA: Diagnosis present

## 2013-10-04 DIAGNOSIS — I70269 Atherosclerosis of native arteries of extremities with gangrene, unspecified extremity: Secondary | ICD-10-CM | POA: Diagnosis present

## 2013-10-04 DIAGNOSIS — Z833 Family history of diabetes mellitus: Secondary | ICD-10-CM

## 2013-10-04 DIAGNOSIS — I96 Gangrene, not elsewhere classified: Secondary | ICD-10-CM | POA: Diagnosis present

## 2013-10-04 DIAGNOSIS — Z8249 Family history of ischemic heart disease and other diseases of the circulatory system: Secondary | ICD-10-CM

## 2013-10-04 DIAGNOSIS — Z7982 Long term (current) use of aspirin: Secondary | ICD-10-CM

## 2013-10-04 DIAGNOSIS — Z95810 Presence of automatic (implantable) cardiac defibrillator: Secondary | ICD-10-CM

## 2013-10-04 DIAGNOSIS — Z794 Long term (current) use of insulin: Secondary | ICD-10-CM

## 2013-10-04 DIAGNOSIS — E1169 Type 2 diabetes mellitus with other specified complication: Principal | ICD-10-CM | POA: Diagnosis present

## 2013-10-04 DIAGNOSIS — M9080 Osteopathy in diseases classified elsewhere, unspecified site: Secondary | ICD-10-CM | POA: Diagnosis present

## 2013-10-04 DIAGNOSIS — M869 Osteomyelitis, unspecified: Secondary | ICD-10-CM | POA: Diagnosis present

## 2013-10-04 DIAGNOSIS — F0390 Unspecified dementia without behavioral disturbance: Secondary | ICD-10-CM | POA: Diagnosis present

## 2013-10-04 DIAGNOSIS — L8995 Pressure ulcer of unspecified site, unstageable: Secondary | ICD-10-CM | POA: Diagnosis present

## 2013-10-04 DIAGNOSIS — I5022 Chronic systolic (congestive) heart failure: Secondary | ICD-10-CM | POA: Diagnosis present

## 2013-10-04 DIAGNOSIS — I4891 Unspecified atrial fibrillation: Secondary | ICD-10-CM | POA: Diagnosis present

## 2013-10-04 DIAGNOSIS — I5042 Chronic combined systolic (congestive) and diastolic (congestive) heart failure: Secondary | ICD-10-CM | POA: Diagnosis present

## 2013-10-04 DIAGNOSIS — I2510 Atherosclerotic heart disease of native coronary artery without angina pectoris: Secondary | ICD-10-CM | POA: Diagnosis present

## 2013-10-04 DIAGNOSIS — I10 Essential (primary) hypertension: Secondary | ICD-10-CM | POA: Diagnosis present

## 2013-10-04 DIAGNOSIS — E876 Hypokalemia: Secondary | ICD-10-CM | POA: Diagnosis not present

## 2013-10-04 DIAGNOSIS — I509 Heart failure, unspecified: Secondary | ICD-10-CM | POA: Diagnosis present

## 2013-10-04 DIAGNOSIS — E785 Hyperlipidemia, unspecified: Secondary | ICD-10-CM | POA: Diagnosis present

## 2013-10-04 DIAGNOSIS — E7800 Pure hypercholesterolemia, unspecified: Secondary | ICD-10-CM | POA: Diagnosis present

## 2013-10-04 DIAGNOSIS — M86679 Other chronic osteomyelitis, unspecified ankle and foot: Secondary | ICD-10-CM | POA: Diagnosis present

## 2013-10-04 DIAGNOSIS — Z7902 Long term (current) use of antithrombotics/antiplatelets: Secondary | ICD-10-CM

## 2013-10-04 DIAGNOSIS — K219 Gastro-esophageal reflux disease without esophagitis: Secondary | ICD-10-CM | POA: Diagnosis present

## 2013-10-04 DIAGNOSIS — I739 Peripheral vascular disease, unspecified: Secondary | ICD-10-CM | POA: Diagnosis present

## 2013-10-04 DIAGNOSIS — Z87442 Personal history of urinary calculi: Secondary | ICD-10-CM

## 2013-10-04 DIAGNOSIS — E669 Obesity, unspecified: Secondary | ICD-10-CM | POA: Diagnosis present

## 2013-10-04 HISTORY — PX: AMPUTATION: SHX166

## 2013-10-04 HISTORY — PX: TOE AMPUTATION: SHX809

## 2013-10-04 LAB — GLUCOSE, CAPILLARY
GLUCOSE-CAPILLARY: 160 mg/dL — AB (ref 70–99)
GLUCOSE-CAPILLARY: 167 mg/dL — AB (ref 70–99)
GLUCOSE-CAPILLARY: 209 mg/dL — AB (ref 70–99)
Glucose-Capillary: 146 mg/dL — ABNORMAL HIGH (ref 70–99)
Glucose-Capillary: 188 mg/dL — ABNORMAL HIGH (ref 70–99)

## 2013-10-04 SURGERY — AMPUTATION DIGIT
Anesthesia: Monitor Anesthesia Care | Site: Toe | Laterality: Left

## 2013-10-04 MED ORDER — SORBITOL 70 % SOLN: 30.0000 mL | Freq: Every day | Status: DC | PRN

## 2013-10-04 MED ORDER — METOCLOPRAMIDE HCL 5 MG/ML IJ SOLN: 5.0000 mg | Freq: Three times a day (TID) | INTRAMUSCULAR | Status: DC | PRN

## 2013-10-04 MED ORDER — DIPHENHYDRAMINE HCL 12.5 MG/5ML PO ELIX: 25.0000 mg | ORAL_SOLUTION | ORAL | Status: DC | PRN

## 2013-10-04 MED ORDER — ATORVASTATIN CALCIUM 10 MG PO TABS
10.0000 mg | ORAL_TABLET | Freq: Every day | ORAL | Status: DC
  Administered 2013-10-04 – 2013-10-07 (×4): 10 mg via ORAL
  Filled 2013-10-04 (×4): qty 1

## 2013-10-04 MED ORDER — PANTOPRAZOLE SODIUM 40 MG PO TBEC
40.0000 mg | DELAYED_RELEASE_TABLET | Freq: Every day | ORAL | Status: DC
  Administered 2013-10-04 – 2013-10-07 (×4): 40 mg via ORAL
  Filled 2013-10-04 (×3): qty 1

## 2013-10-04 MED ORDER — PROPOFOL 10 MG/ML IV BOLUS
INTRAVENOUS | Status: AC
  Filled 2013-10-04: qty 20

## 2013-10-04 MED ORDER — SENNA 8.6 MG PO TABS
1.0000 | ORAL_TABLET | Freq: Two times a day (BID) | ORAL | Status: DC
  Administered 2013-10-04 – 2013-10-07 (×6): 8.6 mg via ORAL
  Filled 2013-10-04 (×8): qty 1

## 2013-10-04 MED ORDER — ONDANSETRON HCL 4 MG/2ML IJ SOLN
INTRAMUSCULAR | Status: AC
  Filled 2013-10-04: qty 2

## 2013-10-04 MED ORDER — INSULIN DETEMIR 100 UNIT/ML ~~LOC~~ SOLN: 45.0000 [IU] | Freq: Every day | SUBCUTANEOUS | Status: DC

## 2013-10-04 MED ORDER — PHENYLEPHRINE 40 MCG/ML (10ML) SYRINGE FOR IV PUSH (FOR BLOOD PRESSURE SUPPORT)
PREFILLED_SYRINGE | INTRAVENOUS | Status: AC
  Filled 2013-10-04: qty 10

## 2013-10-04 MED ORDER — DOXYCYCLINE HYCLATE 100 MG PO TABS
100.0000 mg | ORAL_TABLET | Freq: Two times a day (BID) | ORAL | Status: DC
  Administered 2013-10-04 – 2013-10-07 (×7): 100 mg via ORAL
  Filled 2013-10-04 (×8): qty 1

## 2013-10-04 MED ORDER — PROPOFOL INFUSION 10 MG/ML OPTIME
INTRAVENOUS | Status: DC | PRN
  Administered 2013-10-04: 50 ug/kg/min via INTRAVENOUS

## 2013-10-04 MED ORDER — MORPHINE SULFATE 2 MG/ML IJ SOLN: 1.0000 mg | INTRAMUSCULAR | Status: DC | PRN

## 2013-10-04 MED ORDER — 0.9 % SODIUM CHLORIDE (POUR BTL) OPTIME
TOPICAL | Status: DC | PRN
  Administered 2013-10-04: 1000 mL

## 2013-10-04 MED ORDER — BUPIVACAINE HCL (PF) 0.25 % IJ SOLN
INTRAMUSCULAR | Status: AC
  Filled 2013-10-04: qty 30

## 2013-10-04 MED ORDER — INSULIN ASPART 100 UNIT/ML ~~LOC~~ SOLN
0.0000 [IU] | Freq: Three times a day (TID) | SUBCUTANEOUS | Status: DC
  Administered 2013-10-04 (×2): 3 [IU] via SUBCUTANEOUS
  Administered 2013-10-05: 2 [IU] via SUBCUTANEOUS
  Administered 2013-10-05 – 2013-10-06 (×3): 5 [IU] via SUBCUTANEOUS
  Administered 2013-10-06: 8 [IU] via SUBCUTANEOUS
  Administered 2013-10-06: 3 [IU] via SUBCUTANEOUS
  Administered 2013-10-07: 5 [IU] via SUBCUTANEOUS
  Administered 2013-10-07: 3 [IU] via SUBCUTANEOUS

## 2013-10-04 MED ORDER — FUROSEMIDE 40 MG PO TABS
40.0000 mg | ORAL_TABLET | Freq: Two times a day (BID) | ORAL | Status: DC
  Administered 2013-10-04 – 2013-10-07 (×6): 40 mg via ORAL
  Filled 2013-10-04 (×8): qty 1

## 2013-10-04 MED ORDER — HYDROCODONE-ACETAMINOPHEN 5-325 MG PO TABS
1.0000 | ORAL_TABLET | ORAL | Status: DC | PRN
  Administered 2013-10-04 (×2): 1 via ORAL
  Administered 2013-10-05 – 2013-10-07 (×4): 2 via ORAL
  Filled 2013-10-04 (×2): qty 2
  Filled 2013-10-04 (×2): qty 1
  Filled 2013-10-04 (×2): qty 2

## 2013-10-04 MED ORDER — INSULIN DETEMIR 100 UNIT/ML ~~LOC~~ SOLN
25.0000 [IU] | Freq: Every day | SUBCUTANEOUS | Status: DC
  Administered 2013-10-04 – 2013-10-06 (×3): 25 [IU] via SUBCUTANEOUS
  Filled 2013-10-04 (×3): qty 0.25

## 2013-10-04 MED ORDER — CARVEDILOL 12.5 MG PO TABS: 12.5000 mg | ORAL_TABLET | Freq: Two times a day (BID) | ORAL | Status: DC

## 2013-10-04 MED ORDER — METHOCARBAMOL 100 MG/ML IJ SOLN
500.0000 mg | Freq: Four times a day (QID) | INTRAVENOUS | Status: DC | PRN
  Filled 2013-10-04: qty 5

## 2013-10-04 MED ORDER — ONDANSETRON HCL 4 MG/2ML IJ SOLN
INTRAMUSCULAR | Status: DC | PRN
  Administered 2013-10-04: 4 mg via INTRAVENOUS

## 2013-10-04 MED ORDER — INSULIN DETEMIR 100 UNIT/ML ~~LOC~~ SOLN
5.0000 [IU] | Freq: Every day | SUBCUTANEOUS | Status: DC
  Filled 2013-10-04: qty 0.05

## 2013-10-04 MED ORDER — INSULIN ASPART 100 UNIT/ML ~~LOC~~ SOLN
0.0000 [IU] | Freq: Every day | SUBCUTANEOUS | Status: DC
  Administered 2013-10-04 – 2013-10-06 (×3): 2 [IU] via SUBCUTANEOUS

## 2013-10-04 MED ORDER — METHOCARBAMOL 500 MG PO TABS
500.0000 mg | ORAL_TABLET | Freq: Four times a day (QID) | ORAL | Status: DC | PRN
  Administered 2013-10-04 – 2013-10-05 (×2): 500 mg via ORAL
  Filled 2013-10-04 (×2): qty 1

## 2013-10-04 MED ORDER — SODIUM CHLORIDE 0.9 % IV SOLN
INTRAVENOUS | Status: DC
Start: 2013-10-04 — End: 2013-10-04

## 2013-10-04 MED ORDER — FENTANYL CITRATE 0.05 MG/ML IJ SOLN
INTRAMUSCULAR | Status: AC
  Filled 2013-10-04: qty 5

## 2013-10-04 MED ORDER — SPIRONOLACTONE 12.5 MG HALF TABLET
12.5000 mg | ORAL_TABLET | Freq: Every day | ORAL | Status: DC
  Administered 2013-10-04 – 2013-10-07 (×4): 12.5 mg via ORAL
  Filled 2013-10-04 (×4): qty 1

## 2013-10-04 MED ORDER — BACITRACIN ZINC 500 UNIT/GM EX OINT
TOPICAL_OINTMENT | CUTANEOUS | Status: AC
  Filled 2013-10-04: qty 15

## 2013-10-04 MED ORDER — ONDANSETRON HCL 4 MG PO TABS: 4.0000 mg | ORAL_TABLET | Freq: Four times a day (QID) | ORAL | Status: DC | PRN

## 2013-10-04 MED ORDER — OXYCODONE HCL 5 MG PO TABS: 5.0000 mg | ORAL_TABLET | ORAL | Status: DC | PRN

## 2013-10-04 MED ORDER — LACTATED RINGERS IV SOLN
INTRAVENOUS | Status: DC | PRN
  Administered 2013-10-04: 07:00:00 via INTRAVENOUS

## 2013-10-04 MED ORDER — POLYETHYLENE GLYCOL 3350 17 G PO PACK
17.0000 g | PACK | Freq: Every day | ORAL | Status: DC | PRN
  Administered 2013-10-07: 17 g via ORAL
  Filled 2013-10-04: qty 1

## 2013-10-04 MED ORDER — CARVEDILOL 12.5 MG PO TABS
12.5000 mg | ORAL_TABLET | Freq: Two times a day (BID) | ORAL | Status: DC
  Administered 2013-10-04 – 2013-10-07 (×6): 12.5 mg via ORAL
  Filled 2013-10-04 (×8): qty 1

## 2013-10-04 MED ORDER — BUPIVACAINE HCL (PF) 0.25 % IJ SOLN: INTRAMUSCULAR | Status: DC | PRN

## 2013-10-04 MED ORDER — FENTANYL CITRATE 0.05 MG/ML IJ SOLN
INTRAMUSCULAR | Status: DC | PRN
  Administered 2013-10-04: 25 ug via INTRAVENOUS
  Administered 2013-10-04: 50 ug via INTRAVENOUS

## 2013-10-04 MED ORDER — PHENYLEPHRINE HCL 10 MG/ML IJ SOLN
INTRAMUSCULAR | Status: DC | PRN
  Administered 2013-10-04 (×2): 80 ug via INTRAVENOUS

## 2013-10-04 MED ORDER — BACITRACIN ZINC 500 UNIT/GM EX OINT
TOPICAL_OINTMENT | CUTANEOUS | Status: DC | PRN
  Administered 2013-10-04: 1 via TOPICAL

## 2013-10-04 MED ORDER — METOCLOPRAMIDE HCL 10 MG PO TABS: 5.0000 mg | ORAL_TABLET | Freq: Three times a day (TID) | ORAL | Status: DC | PRN

## 2013-10-04 MED ORDER — CARVEDILOL 12.5 MG PO TABS
ORAL_TABLET | ORAL | Status: AC
  Administered 2013-10-04: 12.5 mg via ORAL
  Filled 2013-10-04: qty 1

## 2013-10-04 MED ORDER — MAGNESIUM CITRATE PO SOLN: 1.0000 | Freq: Once | ORAL | Status: AC | PRN

## 2013-10-04 MED ORDER — SODIUM CHLORIDE 0.9 % IR SOLN
Status: DC | PRN
  Administered 2013-10-04: 3000 mL

## 2013-10-04 MED ORDER — ONDANSETRON HCL 4 MG/2ML IJ SOLN: 4.0000 mg | Freq: Four times a day (QID) | INTRAMUSCULAR | Status: DC | PRN

## 2013-10-04 MED ORDER — CLOPIDOGREL BISULFATE 75 MG PO TABS
75.0000 mg | ORAL_TABLET | Freq: Every day | ORAL | Status: DC
  Administered 2013-10-05 – 2013-10-07 (×3): 75 mg via ORAL
  Filled 2013-10-04 (×4): qty 1

## 2013-10-04 SURGICAL SUPPLY — 52 items
BANDAGE CONFORM 3  STR LF (GAUZE/BANDAGES/DRESSINGS) IMPLANT
BLADE AVERAGE 25MMX9MM (BLADE)
BLADE AVERAGE 25X9 (BLADE) IMPLANT
BLADE LONG MED 31MMX9MM (MISCELLANEOUS) ×1
BLADE LONG MED 31X9 (MISCELLANEOUS) ×2 IMPLANT
BNDG CMPR 9X4 STRL LF SNTH (GAUZE/BANDAGES/DRESSINGS) ×1
BNDG COHESIVE 4X5 TAN STRL (GAUZE/BANDAGES/DRESSINGS) ×2 IMPLANT
BNDG CONFORM 2 STRL LF (GAUZE/BANDAGES/DRESSINGS) ×1 IMPLANT
BNDG CONFORM 3 STRL LF (GAUZE/BANDAGES/DRESSINGS) ×2 IMPLANT
BNDG ESMARK 4X9 LF (GAUZE/BANDAGES/DRESSINGS) ×3 IMPLANT
CANISTER SUCTION 2500CC (MISCELLANEOUS) ×1 IMPLANT
CORDS BIPOLAR (ELECTRODE) IMPLANT
COVER SURGICAL LIGHT HANDLE (MISCELLANEOUS) ×3 IMPLANT
CUFF TOURNIQUET SINGLE 18IN (TOURNIQUET CUFF) IMPLANT
CUFF TOURNIQUET SINGLE 24IN (TOURNIQUET CUFF) IMPLANT
CUFF TOURNIQUET SINGLE 34IN LL (TOURNIQUET CUFF) IMPLANT
DRAPE U-SHAPE 47X51 STRL (DRAPES) IMPLANT
DURAPREP 26ML APPLICATOR (WOUND CARE) ×3 IMPLANT
ELECT CAUTERY BLADE 6.4 (BLADE) ×3 IMPLANT
ELECT REM PT RETURN 9FT ADLT (ELECTROSURGICAL) ×3
ELECTRODE REM PT RTRN 9FT ADLT (ELECTROSURGICAL) ×1 IMPLANT
FACESHIELD LNG OPTICON STERILE (SAFETY) ×3 IMPLANT
GAUZE XEROFORM 1X8 LF (GAUZE/BANDAGES/DRESSINGS) ×2 IMPLANT
GLOVE BIO SURGEON STRL SZ7.5 (GLOVE) ×2 IMPLANT
GLOVE BIOGEL PI IND STRL 6.5 (GLOVE) IMPLANT
GLOVE BIOGEL PI INDICATOR 6.5 (GLOVE) ×4
GLOVE SURG SS PI 6.5 STRL IVOR (GLOVE) ×2 IMPLANT
GLOVE SURG SS PI 7.5 STRL IVOR (GLOVE) ×5 IMPLANT
GOWN STRL REIN XL XLG (GOWN DISPOSABLE) ×4 IMPLANT
GOWN STRL REUS W/ TWL LRG LVL3 (GOWN DISPOSABLE) IMPLANT
GOWN STRL REUS W/TWL LRG LVL3 (GOWN DISPOSABLE) ×3
KIT BASIN OR (CUSTOM PROCEDURE TRAY) ×3 IMPLANT
KIT ROOM TURNOVER OR (KITS) ×3 IMPLANT
MANIFOLD NEPTUNE II (INSTRUMENTS) ×2 IMPLANT
NDL HYPO 25GX1X1/2 BEV (NEEDLE) IMPLANT
NEEDLE HYPO 25GX1X1/2 BEV (NEEDLE) IMPLANT
NS IRRIG 1000ML POUR BTL (IV SOLUTION) ×3 IMPLANT
PACK ORTHO EXTREMITY (CUSTOM PROCEDURE TRAY) ×3 IMPLANT
PAD ARMBOARD 7.5X6 YLW CONV (MISCELLANEOUS) ×6 IMPLANT
SCRUB POVIDONE IODINE 4 OZ (MISCELLANEOUS) ×2 IMPLANT
SOL PREP POV-IOD 4OZ 10% (MISCELLANEOUS) ×2 IMPLANT
SPECIMEN JAR SMALL (MISCELLANEOUS) ×3 IMPLANT
SPONGE GAUZE 4X4 12PLY (GAUZE/BANDAGES/DRESSINGS) ×2 IMPLANT
SPONGE LAP 4X18 X RAY DECT (DISPOSABLE) ×2 IMPLANT
SUT ETHILON 2 0 FS 18 (SUTURE) ×2 IMPLANT
SYR CONTROL 10ML LL (SYRINGE) IMPLANT
TOWEL OR 17X24 6PK STRL BLUE (TOWEL DISPOSABLE) ×1 IMPLANT
TOWEL OR 17X26 10 PK STRL BLUE (TOWEL DISPOSABLE) ×3 IMPLANT
TUBE CONNECTING 12'X1/4 (SUCTIONS) ×1
TUBE CONNECTING 12X1/4 (SUCTIONS) ×1 IMPLANT
WATER STERILE IRR 1000ML POUR (IV SOLUTION) ×1 IMPLANT
YANKAUER SUCT BULB TIP NO VENT (SUCTIONS) ×2 IMPLANT

## 2013-10-04 NOTE — Progress Notes (Signed)
Utilization review completed.  

## 2013-10-04 NOTE — Anesthesia Procedure Notes (Addendum)
Anesthesia Regional Block:  Ankle block  Pre-Anesthetic Checklist: ,, timeout performed, Correct Patient, Correct Site, Correct Laterality, Correct Procedure, Correct Position, site marked, Risks and benefits discussed,  Surgical consent,  Pre-op evaluation,  At surgeon's request and post-op pain management  Laterality: Left  Prep: chloraprep       Needles:  Injection technique: Single-shot      Additional Needles: Ankle block Narrative:  Start time: 10/04/2013 7:00 AM End time: 10/04/2013 7:05 AM Injection made incrementally with aspirations every 5 mL.  Performed by: Personally  Anesthesiologist: Sharolyn Douglas MD  Additional Notes: Pt accepts procedure w/ risks. 30cc ( 15cc 2% Lidocaine and 15cc 0.5% Marcaine ) Ant/ Post Tibial nerves. GES   Procedure Name: MAC Date/Time: 10/04/2013 7:30 AM Performed by: Carola Frost Pre-anesthesia Checklist: Patient identified, Emergency Drugs available, Suction available, Patient being monitored and Timeout performed Patient Re-evaluated:Patient Re-evaluated prior to inductionOxygen Delivery Method: Simple face mask Intubation Type: IV induction Placement Confirmation: positive ETCO2 and breath sounds checked- equal and bilateral Dental Injury: Teeth and Oropharynx as per pre-operative assessment

## 2013-10-04 NOTE — Op Note (Signed)
Date of surgery: 10/04/2048  Preoperative diagnosis: Osteomyelitis of left great toe and second toe  Postoperative diagnosis: Same  Procedure: 1. Amputation of left great toe metatarsophalangeal joint. 2. Amputation of left second toe through metatarsophalangeal joint  Surgeon: Eduard Roux, M.D.  Anesthesia: Regional and sedation  Estimated blood loss: Minimal  Complications: None next  Condition to PACU: Stable  Indication for procedure: The patient is a 68 year old gentleman who presents today for surgical treatment of osteomyelitis of his left great and second toe. The risks, benefits and alternatives to surgery were discussed with the patient and he wished to proceed with surgery.  Description of procedure: The patient was identified in the preoperative holding area. The operative sites were confirmed with the patient and marked by the surgeon. He is brought back to the operating room. Sedation was administered by the anesthesiologist. A regional ankle block was performed by the anesthesiologist. A timeout was performed. The left lower extremity was prepped and draped in standard sterile fashion. Preoperative antibiotics were given. I use a medially based racquet type incision that incorporated both the great and second toe. Full-thickness flaps were created. The great toe and the second toe were disarticulated through the metatarsal phalangeal joint. The subcutaneous tissue was sharply debrided using a rongeur. Once all of the remaining tissue appeared to be healthy and viable 3 L of normal saline was irrigated through the wound using cystoscopy tubing. Hemostasis was obtained. The wound was again inspected for infection. The wound bed appeared to be healthy and viable. The wound was closed without tension with interrupted 2-0 nylon sutures. A sterile dressing was applied. Patient awoke from anesthesia uneventfully and was transferred to the PACU in stable condition.  Disposition: The  patient will be made to the orthopedic unit. Given his multiple medical comorbidities a hospitalist consult will be obtained for comanagement. She will undergo physical therapy in the morning and placement pending physical therapy recommendations.  Azucena Cecil, MD [place] 8:20 AM

## 2013-10-04 NOTE — Addendum Note (Signed)
Addendum created 10/04/13 0951 by Carola Frost, CRNA   Modules edited: Anesthesia Flowsheet, Anesthesia Medication Administration

## 2013-10-04 NOTE — Preoperative (Signed)
Beta Blockers   Reason not to administer Beta Blockers:Not Applicable, pt  Took coreg

## 2013-10-04 NOTE — Progress Notes (Signed)
Orthopedic Tech Progress Note Patient Details:  [name] [birth year] [id number] OHF applied to bed Patient ID: [name] [name], male   DOB: 1946/02/11, 68 y.o.   MRN: [id number]   [name] 10/04/2013, 11:39 AM

## 2013-10-04 NOTE — H&P (Signed)
PREOPERATIVE H&P  Chief Complaint: osteomyelitis left great toe and 2nd toe  HPI: [name] [name] is a 68 y.o. male who presents for surgical treatment of osteomyelitis left great toe and 2nd toe.  He denies any changes in medical history.  Has been cleared by his cardiologist.  Past Medical History  Diagnosis Date  . GERD (gastroesophageal reflux disease)   . Obesity   . Cardiomyopathy, nonischemic     a. Cath 2003: mild nonobstructive CAD, EF 25% at that time.  . Hypertension   . Chronic systolic CHF (congestive heart failure)     a. NICM EF 25% dating back to at least 2003.  [name] PAF (paroxysmal atrial fibrillation)     a. Noted on ICD interrogation 2012;  b. coumadin d/c'd 01/2013.  [name] NSVT (nonsustained ventricular tachycardia)     a. Noted on ICD interrogation in 2011.  [name] LBBB (left bundle branch block)   . Kidney stone   . Critical lower limb ischemia   . High cholesterol   . PAD (peripheral artery disease)     a. 08/2013 Periph Angio/PTA: Abd Ao nl, RLE- 3v runoff, PT diff dzs, AT 90p, LLE 2v runoff, PT 100, AT 74m (diamondback ORA/chocolate balloon PTA).  . Type II diabetes mellitus   . Uncontrolled pain, Lt toe 09/21/2013  . Gangrenous toe, Lt toe 09/21/2013  . CAD (coronary artery disease)     a. Nonobstructive by cath 09/2001.  [name] Automatic implantable cardioverter-defibrillator in situ     a. 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  . Dementia    Past Surgical History  Procedure Laterality Date  . Lithotripsy  2001  . Cervical spine surgery  1994  . Cardiac defibrillator placement  06/2009    WITH GENERATOR REPLACED; BiV ICD  . US echocardiography  03/21/2008    EF 30-35%  . Cardiovascular stress test  03/20/2009    EF 33%  . Transluminal atherectomy tibial artery Left 09/12/2013  . Cardiac catheterization  10/01/2001    THERE WAS GLOBAL HYPOKINESIS AND EF 25%. THERE APPEARED TO BE GLOBAL DECREASE IN WALL MOTION   History   Social  History  . Marital Status: Married    Spouse Name: N/A    Number of Children: N/A  . Years of Education: N/A   Social History Main Topics  . Smoking status: Never Smoker   . Smokeless tobacco: Never Used  . Alcohol Use: No  . Drug Use: No  . Sexual Activity: Yes   Other Topics Concern  . None   Social History Narrative  . None   Family History  Problem Relation Age of Onset  . Heart disease Mother   . Hypertension Mother   . Diabetes Mother   . Diabetes Father    No Known Allergies Prior to Admission medications   Medication Sig Start Date End Date Taking? Authorizing Provider  aspirin EC 81 MG tablet Take 81 mg by mouth daily.   Yes Historical Provider, MD  atorvastatin (LIPITOR) 10 MG tablet Take 10 mg by mouth daily.     Yes Historical Provider, MD  carvedilol (COREG) 12.5 MG tablet Take 12.5 mg by mouth 2 (two) times daily with a meal.   Yes Historical Provider, MD  doxycycline (VIBRA-TABS) 100 MG tablet Take 100 mg by mouth 2 (two) times daily. Toe wound. Started on 09/02/13 09/02/13  Yes Historical Provider, MD  esomeprazole (NEXIUM) 40 MG capsule Take 40 mg by mouth daily as needed (  acid reflux).    Yes Historical Provider, MD  furosemide (LASIX) 40 MG tablet Take 1 tablet (40 mg total) by mouth 2 (two) times daily. 06/28/13  Yes Deboraha Sprang, MD  Hydrocodone-Acetaminophen 5-300 MG TABS Take 1 tablet by mouth every 4 (four) hours as needed (for toe pain). 09/21/13  Yes Cecilie Kicks, NP  insulin detemir (LEVEMIR) 100 UNIT/ML injection Inject 45 Units into the skin at bedtime. 02/18/13  Yes Orson Eva, MD  metFORMIN (GLUCOPHAGE) 500 MG tablet Take 1 tablet (500 mg total) by mouth 2 (two) times daily. 09/13/13  Yes Rogelia Mire, NP  Multiple Vitamins-Minerals (MULTIVITAMIN PO) Take 1 tablet by mouth daily.   Yes Historical Provider, MD  NOVOLOG 100 UNIT/ML injection Inject 4 Units into the skin as needed for high blood sugar.  04/20/13  Yes Historical Provider, MD  silver  sulfADIAZINE (SILVADENE) 1 % cream Apply 1 application topically daily.  09/26/13  Yes Historical Provider, MD  spironolactone (ALDACTONE) 25 MG tablet Take 0.5 tablets (12.5 mg total) by mouth daily. 06/28/13  Yes Deboraha Sprang, MD  vitamin E (VITAMIN E) 1000 UNIT capsule Take 1,000 Units by mouth daily.   Yes Historical Provider, MD  clopidogrel (PLAVIX) 75 MG tablet Take 1 tablet (75 mg total) by mouth daily with breakfast. 09/13/13   Rogelia Mire, NP     Positive ROS: All other systems have been reviewed and were otherwise negative with the exception of those mentioned in the HPI and as above.  Physical Exam: General: Alert, no acute distress Cardiovascular: No pedal edema Respiratory: No cyanosis, no use of accessory musculature GI: No organomegaly, abdomen is soft and non-tender Skin: No lesions in the area of chief complaint Neurologic: Sensation intact distally Psychiatric: Patient is competent for consent with normal mood and affect Lymphatic: No axillary or cervical lymphadenopathy  MUSCULOSKELETAL:  Osteo of great and 2nd toe  Assessment: osteomyelitis left great toe and 2nd toe  Plan: Plan for Procedure(s): LEFT GREAT TOE AND 2ND TOE AMPUTATION  The risks benefits and alternatives were discussed with the patient including but not limited to the risks of nonoperative treatment, versus surgical intervention including infection, bleeding, nerve injury,  blood clots, cardiopulmonary complications, morbidity, mortality, among others, and they were willing to proceed.   Marianna Payment, MD   10/04/2013 6:50 AM

## 2013-10-04 NOTE — Addendum Note (Signed)
Addendum created 10/04/13 0915 by Carola Frost, CRNA   Modules edited: Anesthesia Blocks and Procedures

## 2013-10-04 NOTE — Anesthesia Preprocedure Evaluation (Addendum)
Anesthesia Evaluation  Patient identified by MRN, date of birth, ID band Patient awake    Reviewed: Allergy & Precautions, H&P , NPO status , Patient's Chart, lab work & pertinent test results, reviewed documented beta blocker date and time   Airway Mallampati: II TM Distance: >3 FB Neck ROM: Limited    Dental  (+) Dental Advisory Given and Missing   Pulmonary          Cardiovascular hypertension, Pt. on medications and Pt. on home beta blockers + CAD, + Peripheral Vascular Disease and +CHF + dysrhythmias Ventricular Tachycardia + Cardiac Defibrillator  Echo 6/14- Left ventricle: The cavity size was mildly dilated. Wall thickness was normal. Systolic function was severely reduced. The estimated ejection fraction was in the range of 20% to 25%. Diffuse hypokinesis. Doppler parameters are consistent with abnormal left ventricular relaxation (grade 1 diastolic dysfunction). - Mitral valve: Mild regurgitation. - Left atrium: The atrium was mildly dilated.    Neuro/Psych PSYCHIATRIC DISORDERS    GI/Hepatic GERD-  Medicated,  Endo/Other  diabetes, Insulin Dependent  Renal/GU      Musculoskeletal   Abdominal   Peds  Hematology   Anesthesia Other Findings   Reproductive/Obstetrics                       Anesthesia Physical Anesthesia Plan  ASA: III  Anesthesia Plan: Regional and MAC   Post-op Pain Management:    Induction: Intravenous  Airway Management Planned: Simple Face Mask  Additional Equipment:   Intra-op Plan:   Post-operative Plan:   Informed Consent: I have reviewed the patients History and Physical, chart, labs and discussed the procedure including the risks, benefits and alternatives for the proposed anesthesia with the patient or authorized representative who has indicated his/her understanding and acceptance.     Plan Discussed with:   Anesthesia Plan Comments:          Anesthesia Quick Evaluation

## 2013-10-04 NOTE — Evaluation (Signed)
Physical Therapy Evaluation Patient Details Name: [name] [name] MRN: [id number] DOB: [birth year] Today's Date: 10/04/2013 Time: 3295-1884 PT Time Calculation (min): 18 min  PT Assessment / Plan / Recommendation History of Present Illness  [name] [name] is a 68 y.o. male who presents for surgical treatment of osteomyelitis left great toe and 2nd toe. s/p amputation of L great and 2nd toe  Clinical Impression  Pt presents with decreased gait, mobility and strength and will benefit from skilled acute PT to address deficits and increase functional independence.    PT Assessment  Patient needs continued PT services    Follow Up Recommendations  CIR    Does the patient have the potential to tolerate intense rehabilitation      Barriers to Discharge Inaccessible home environment;Decreased caregiver support pt's wife works    Equipment Recommendations  None recommended by PT    Recommendations for Other Services Rehab consult   Frequency Min 5X/week    Precautions / Restrictions Precautions Precautions: Fall Required Braces or Orthoses:  (surgical shoe R foot) Restrictions Weight Bearing Restrictions: Yes LLE Weight Bearing: Non weight bearing   Pertinent Vitals/Pain No c/o pain      Mobility  Bed Mobility Overal bed mobility: Needs Assistance Bed Mobility: Supine to Sit Supine to sit: Min assist General bed mobility comments: increased time, uses bed rails, min A to scoot hips forward to edge of bed Transfers Overall transfer level: Needs assistance Transfers: Sit to/from Stand;Stand Pivot Transfers Sit to Stand: Max assist Stand pivot transfers: Max assist General transfer comment: max lifting assist for sit to stand, pt able to hop few steps and swivel hips to sit in recliner with min/mod A.  pt with good maintaining NWB    Exercises     PT Diagnosis: Difficulty walking;Generalized weakness  PT Problem List: Decreased strength;Decreased  mobility;Decreased activity tolerance;Decreased balance;Decreased knowledge of use of DME PT Treatment Interventions: Gait training;DME instruction;Therapeutic exercise;Balance training;Stair training;Functional mobility training;Therapeutic activities;Patient/family education;Neuromuscular re-education;Modalities;Wheelchair mobility training     PT Goals(Current goals can be found in the care plan section) Acute Rehab PT Goals Patient Stated Goal: go home PT Goal Formulation: With patient/family Time For Goal Achievement: 10/18/13 Potential to Achieve Goals: Good  Visit Information  Last PT Received On: 10/04/13 Assistance Needed: +2 History of Present Illness: [name] [name] is a 68 y.o. male who presents for surgical treatment of osteomyelitis left great toe and 2nd toe. s/p amputation of L great and 2nd toe       Prior [place] expects to be discharged to:: Private residence Living Arrangements: Spouse/significant other Available Help at Discharge: Family;Available PRN/intermittently Type of Home: House Home Access: Stairs to enter [employer] of Steps: 2 Entrance Stairs-Rails: None Home Layout: Able to live on main level with bedroom/bathroom (steps down into den) Home Equipment: [place] Rile - 2 wheels;Wheelchair - manual Prior Function Level of Independence: Needs assistance Gait / Transfers Assistance Needed: needs RW ADL's / Homemaking Assistance Needed: wife assists with bathing Communication Communication: No difficulties    Cognition  Cognition Arousal/Alertness: Awake/alert Behavior During Therapy: WFL for tasks assessed/performed Overall Cognitive Status: Within Functional Limits for tasks assessed    Extremity/Trunk Assessment Upper Extremity Assessment Upper Extremity Assessment: Generalized weakness Lower Extremity Assessment Lower Extremity Assessment: Generalized weakness Cervical / Trunk Assessment Cervical /  Trunk Assessment: Normal   Balance    End of Session PT - End of Session Equipment Utilized During Treatment: Gait belt (R surgical shoe) Activity Tolerance:  Patient limited by fatigue Patient left: in chair;with call bell/phone within reach Nurse Communication: Mobility status  GP     Venba Zenner 10/04/2013, 11:15 AM

## 2013-10-04 NOTE — Anesthesia Postprocedure Evaluation (Signed)
  Anesthesia Post-op Note  [name]  Procedure(s) Performed: Procedure(s): LEFT GREAT TOE AND SECOND TOE AMPUTATION (Left)  Patient Location: PACU  Anesthesia Type:Regional  Level of Consciousness: awake, alert , oriented and patient cooperative  Airway and Oxygen Therapy: Patient Spontanous Breathing  Post-op Pain: none  Post-op Assessment: Post-op Vital signs reviewed, Patient's Cardiovascular Status Stable, Respiratory Function Stable, Patent Airway, No signs of Nausea or vomiting and Pain level controlled  Post-op Vital Signs: stable  Complications: No apparent anesthesia complications

## 2013-10-04 NOTE — Consult Note (Signed)
[facility] Medical Consultation  [name] [name] DGU:[id number] DOB: Sep 07, 1945 DOA: 10/04/2013 PCP: [name], MD   Requesting physician: Dr. Erlinda Hong Date of consultation: 2.10.2015 Reason for consultation: HTN and Diabetes mellitus  Impression/Recommendations Chronic osteomyelitis of toe of left foot/  Gangrenous toe, Lt toe/  Foot osteomyelitis, left: - Status post amputation of the second left toe, continue IV antibiotics wound care PT OT further management per orthopedic surgeon.  NICM- EF 95-63% echo 8/75 SYSTOLIC HEART FAILURE, CHRONIC - Mild JVD, resume his Lasix home dose, restrict his diet to 1.2 L a day and KVO IV fluids. - Basic metabolic panel in the morning.  HTN (hypertension) - Blood pressure mildly high resume home medications.   Diabetes mellitus - Continue sliding scale insulin at Levemir 5 continue CBGs a.c. and at bedtime.   Hyperlipidemia - Continue statin therapy.  I will followup again tomorrow. Please contact me if I can be of assistance in the meanwhile. Thank you for this consultation.  Chief Complaint: Diabetes & HTN  HPI:  68 year old male with past medical history of nonischemic cardiomyopathy with an EF of 25% back on 02/15/2013,  s/p BiV-ICD 2005, with generator change 06/2009 hypertension insulin-dependent diabetes mellitus, tobacco use, peripheral vascular disease status post orbital rotation of atherectomy of the mid anterior tibial artery, with peripheral angioplasty chronic atrial fibrillation on aspirin and Plavix (Coumadin DC'd on 6 2014), chronic left bundle branch block  Review of Systems:  Constitutional:  No weight loss, night sweats, Fevers, chills, fatigue.  HEENT:  No headaches, Difficulty swallowing,Tooth/dental problems,Sore throat,  No sneezing, itching, ear ache, nasal congestion, post nasal drip,  Cardio-vascular:  No chest pain, Orthopnea, PND, swelling in lower extremities, anasarca, dizziness, palpitations   GI:  No heartburn, indigestion, abdominal pain, nausea, vomiting, diarrhea, change in bowel habits, loss of appetite  Resp:  No shortness of breath with exertion or at rest. No excess mucus, no productive cough, No non-productive cough, No coughing up of blood.No change in color of mucus.No wheezing.No chest wall deformity  Skin:  no rash or lesions.  GU:  no dysuria, change in color of urine, no urgency or frequency. No flank pain.  Psych:  No change in mood or affect. No depression or anxiety. No memory loss.  Past Medical History  Diagnosis Date  . GERD (gastroesophageal reflux disease)   . Obesity   . Cardiomyopathy, nonischemic     a. Cath 2003: mild nonobstructive CAD, EF 25% at that time.  . Hypertension   . Chronic systolic CHF (congestive heart failure)     a. NICM EF 25% dating back to at least 2003.  [name] PAF (paroxysmal atrial fibrillation)     a. Noted on ICD interrogation 2012;  b. coumadin d/c'd 01/2013.  [name] NSVT (nonsustained ventricular tachycardia)     a. Noted on ICD interrogation in 2011.  [name] LBBB (left bundle branch block)   . Kidney stone   . Critical lower limb ischemia   . High cholesterol   . PAD (peripheral artery disease)     a. 08/2013 Periph Angio/PTA: Abd Ao nl, RLE- 3v runoff, PT diff dzs, AT 90p, LLE 2v runoff, PT 100, AT 101m (diamondback ORA/chocolate balloon PTA).  . Type II diabetes mellitus   . Uncontrolled pain, Lt toe 09/21/2013  . Gangrenous toe, Lt toe 09/21/2013  . CAD (coronary artery disease)     a. Nonobstructive by cath 09/2001.  [name] Automatic implantable cardioverter-defibrillator in situ     a. s/p BiV-ICD  2005, with generator change 06/2009 [employer], historic buildings).  . Dementia    Past Surgical History  Procedure Laterality Date  . Lithotripsy  2001  . Cervical spine surgery  1994  . Cardiac defibrillator placement  06/2009    WITH GENERATOR REPLACED; BiV ICD  . US echocardiography  03/21/2008    EF 30-35%  . Cardiovascular stress test   03/20/2009    EF 33%  . Transluminal atherectomy tibial artery Left 09/12/2013  . Cardiac catheterization  10/01/2001    THERE WAS GLOBAL HYPOKINESIS AND EF 25%. THERE APPEARED TO BE GLOBAL DECREASE IN WALL MOTION   Social History:  reports that he has never smoked. He has never used smokeless tobacco. He reports that he does not drink alcohol or use illicit drugs.  No Known Allergies Family History  Problem Relation Age of Onset  . Heart disease Mother   . Hypertension Mother   . Diabetes Mother   . Diabetes Father     Prior to Admission medications   Medication Sig Start Date End Date Taking? Authorizing Provider  aspirin EC 81 MG tablet Take 81 mg by mouth daily.   Yes Historical Provider, MD  atorvastatin (LIPITOR) 10 MG tablet Take 10 mg by mouth daily.     Yes Historical Provider, MD  carvedilol (COREG) 12.5 MG tablet Take 12.5 mg by mouth 2 (two) times daily with a meal.   Yes Historical Provider, MD  doxycycline (VIBRA-TABS) 100 MG tablet Take 100 mg by mouth 2 (two) times daily. Toe wound. Started on 09/02/13 09/02/13  Yes Historical Provider, MD  esomeprazole (NEXIUM) 40 MG capsule Take 40 mg by mouth daily as needed (acid reflux).    Yes Historical Provider, MD  furosemide (LASIX) 40 MG tablet Take 1 tablet (40 mg total) by mouth 2 (two) times daily. 06/28/13  Yes Duke Salvia, MD  Hydrocodone-Acetaminophen 5-300 MG TABS Take 1 tablet by mouth every 4 (four) hours as needed (for toe pain). 09/21/13  Yes Nada Boozer, NP  insulin detemir (LEVEMIR) 100 UNIT/ML injection Inject 45 Units into the skin at bedtime. 02/18/13  Yes Catarina Hartshorn, MD  metFORMIN (GLUCOPHAGE) 500 MG tablet Take 1 tablet (500 mg total) by mouth 2 (two) times daily. 09/13/13  Yes Ok Anis, NP  Multiple Vitamins-Minerals (MULTIVITAMIN PO) Take 1 tablet by mouth daily.   Yes Historical Provider, MD  NOVOLOG 100 UNIT/ML injection Inject 4 Units into the skin as needed for high blood sugar.  04/20/13  Yes  Historical Provider, MD  silver sulfADIAZINE (SILVADENE) 1 % cream Apply 1 application topically daily.  09/26/13  Yes Historical Provider, MD  spironolactone (ALDACTONE) 25 MG tablet Take 0.5 tablets (12.5 mg total) by mouth daily. 06/28/13  Yes Duke Salvia, MD  vitamin E (VITAMIN E) 1000 UNIT capsule Take 1,000 Units by mouth daily.   Yes Historical Provider, MD  clopidogrel (PLAVIX) 75 MG tablet Take 1 tablet (75 mg total) by mouth daily with breakfast. 09/13/13   Ok Anis, NP   Physical Exam: Blood pressure 133/59, pulse 85, temperature 97.6 F (36.4 C), temperature source Oral, resp. rate 16, SpO2 99.00%. Filed Vitals:   10/04/13 1400  BP: 133/59  Pulse: 85  Temp: 97.6 F (36.4 C)  Resp: 16     General:  Alert and oriented x3 no acute distress  Eyes: Anicteric no pallor  ENT: Mucous membranes tonsils and lips with a normal poor oral hygiene  Neck: Midline trachea, mild JVD  Cardiovascular:  Ureter rate and rhythm with positive S1-S2 no murmurs rubs gallops appreciated  Respiratory: Good air movement clear to auscultation  Abdomen: Positive bowel sounds nontender nondistended soft  Skin: Scaly lower extremity skin  Musculoskeletal: His left lower extremity is dressed mildly swollen.  Psychiatric: Intact  Neurologic: Nonfocal  Labs on Admission:  Basic Metabolic Panel:  Recent Labs Lab 09/30/13 1050  NA 141  K 4.3  CL 99  CO2 24  GLUCOSE 153*  BUN 24*  CREATININE 1.13  CALCIUM 9.7   Liver Function Tests: No results found for this basename: AST, ALT, ALKPHOS, BILITOT, PROT, ALBUMIN,  in the last 168 hours No results found for this basename: LIPASE, AMYLASE,  in the last 168 hours No results found for this basename: AMMONIA,  in the last 168 hours CBC:  Recent Labs Lab 09/30/13 1050  WBC 7.3  HGB 11.0*  HCT 33.4*  MCV 74.2*  PLT 314   Cardiac Enzymes: No results found for this basename: CKTOTAL, CKMB, CKMBINDEX, TROPONINI,  in the last  168 hours BNP: No components found with this basename: POCBNP,  CBG:  Recent Labs Lab 10/04/13 0602 10/04/13 0814 10/04/13 1119  GLUCAP 160* 146* 188*    Radiological Exams on Admission: No results found.  EKG: Independently reviewed.none  Time spent: 80 minutes  Charlynne Cousins [facility] Pager [phone]  If 7PM-7AM, please contact night-coverage [email or web address] Password [email or web address] 10/04/2013, 2:03 PM

## 2013-10-04 NOTE — Transfer of Care (Signed)
Immediate Anesthesia Transfer of Care Note  [name]  Procedure(s) Performed: Procedure(s): LEFT GREAT TOE AND SECOND TOE AMPUTATION (Left)  Patient Location: PACU  Anesthesia Type:MAC combined with regional for post-op pain  Level of Consciousness: awake, alert  and oriented  Airway & Oxygen Therapy: Patient Spontanous Breathing  Post-op Assessment: Report given to PACU RN, Post -op Vital signs reviewed and stable and Patient moving all extremities X 4  Post vital signs: Reviewed and stable  Complications: No apparent anesthesia complications

## 2013-10-05 DIAGNOSIS — M86679 Other chronic osteomyelitis, unspecified ankle and foot: Secondary | ICD-10-CM

## 2013-10-05 LAB — GLUCOSE, CAPILLARY
GLUCOSE-CAPILLARY: 210 mg/dL — AB (ref 70–99)
Glucose-Capillary: 146 mg/dL — ABNORMAL HIGH (ref 70–99)
Glucose-Capillary: 207 mg/dL — ABNORMAL HIGH (ref 70–99)

## 2013-10-05 LAB — BASIC METABOLIC PANEL
BUN: 17 mg/dL (ref 6–23)
CHLORIDE: 102 meq/L (ref 96–112)
CO2: 26 meq/L (ref 19–32)
CREATININE: 1.01 mg/dL (ref 0.50–1.35)
Calcium: 8.8 mg/dL (ref 8.4–10.5)
GFR calc Af Amer: 86 mL/min — ABNORMAL LOW (ref >90)
GFR calc non Af Amer: 74 mL/min — ABNORMAL LOW (ref >90)
Glucose, Bld: 181 mg/dL — ABNORMAL HIGH (ref 70–99)
Potassium: 3.6 mEq/L — ABNORMAL LOW (ref 3.7–5.3)
Sodium: 143 mEq/L (ref 137–147)

## 2013-10-05 MED ORDER — JUVEN PO PACK
1.0000 | PACK | Freq: Two times a day (BID) | ORAL | Status: DC
  Administered 2013-10-05 – 2013-10-07 (×3): 1 via ORAL
  Filled 2013-10-05 (×5): qty 1

## 2013-10-05 MED ORDER — ADULT MULTIVITAMIN W/MINERALS CH
1.0000 | ORAL_TABLET | Freq: Every day | ORAL | Status: DC
  Administered 2013-10-05 – 2013-10-07 (×3): 1 via ORAL
  Filled 2013-10-05 (×3): qty 1

## 2013-10-05 NOTE — Evaluation (Signed)
Occupational Therapy Evaluation Patient Details Name: [name] [name] MRN: [id number] DOB: [birth year] Today's Date: 10/05/2013 Time: 0865-7846 OT Time Calculation (min): 20 min  OT Assessment / Plan / Recommendation History of present illness [name] [name] is a 68 y.o. male who presents for surgical treatment of osteomyelitis left great toe and 2nd toe. s/p amputation of L great and 2nd toe   Clinical Impression   Pt was admitted for the above.  He will benefit from skilled OT to increase safety and independence with adls and bathroom transfers.  Prior to admission, pt had assistance for bathing.  Goals in acute are for min A with AE and combining leaning with sit to stand for LB adls.  Pt is very compliant with NWB.  He will benefit from general UE strengthening to assist with transfers.    OT Assessment  Patient needs continued OT Services    Follow Up Recommendations  CIR    Barriers to Discharge      Equipment Recommendations  3 in 1 bedside comode    Recommendations for Other Services    Frequency  Min 2X/week    Precautions / Restrictions Precautions Precautions: Fall Restrictions LLE Weight Bearing: Non weight bearing   Pertinent Vitals/Pain 4/10 L foot; premedicated and repositioned    ADL  Grooming: Set up Where Assessed - Grooming: Unsupported sitting Upper Body Bathing: Set up Where Assessed - Upper Body Bathing: Unsupported sitting Lower Body Bathing: Moderate assistance Where Assessed - Lower Body Bathing: Supported sit to stand Upper Body Dressing: Set up Where Assessed - Upper Body Dressing: Unsupported sitting Lower Body Dressing: +1 Total assistance Where Assessed - Lower Body Dressing: Supported sit to stand Toilet Transfer: Simulated;Moderate assistance Toilet Transfer Method: Squat pivot Toileting - Clothing Manipulation and Hygiene: Maximal assistance Where Assessed - Toileting Clothing Manipulation and Hygiene: Sit to stand from 3-in-1 or  toilet Equipment Used: Rolling walker;Gait belt Transfers/Ambulation Related to ADLs: pt wanted to try squat pivot transfer to chair:  instructed to turn heel towards chair to be able to pivot.  He states he had difficulty hopping yesterday ADL Comments: Did not introduce AE on this visit.  Pt would benefit from this.  Also, he may be able to weight shift at eob to pull pants up as he can not let go of walker with one hand for adls    OT Diagnosis: Generalized weakness  OT Problem List: Decreased strength;Decreased activity tolerance;Decreased knowledge of use of DME or AE;Pain OT Treatment Interventions: Self-care/ADL training;DME and/or AE instruction;Patient/family education;Therapeutic activities;Therapeutic exercise   OT Goals(Current goals can be found in the care plan section) Acute Rehab OT Goals Patient Stated Goal: go for rehab then go home OT Goal Formulation: With patient Time For Goal Achievement: 10/12/13 Potential to Achieve Goals: Good ADL Goals Pt Will Perform Lower Body Bathing: with min assist;with adaptive equipment;sit to/from stand;sitting/lateral leans Pt Will Perform Lower Body Dressing: with min assist;with adaptive equipment;sit to/from stand;sitting/lateral leans Pt Will Transfer to Toilet: with min assist;stand pivot transfer;squat pivot transfer;bedside commode Pt Will Perform Toileting - Clothing Manipulation and hygiene: with min assist;sitting/lateral leans;sit to/from stand Additional ADL Goal #1: pt will be independent with bil UE level 2 therband program to increase UE strength for transfers  Visit Information  Last OT Received On: 10/05/13 Assistance Needed: +1 (squat pivot) History of Present Illness: [name] [name] is a 68 y.o. male who presents for surgical treatment of osteomyelitis left great toe and 2nd toe. s/p amputation of L  great and 2nd toe       Prior [place] expects to be discharged to::  [place]: [facility] - 2 wheels;Wheelchair - manual Additional Comments: has standard toilet; no dme Prior Function Gait / Transfers Assistance Needed: needs RW ADL's / Homemaking Assistance Needed: wife assists with bathing Communication Communication: No difficulties         Vision/Perception     Cognition  Cognition Arousal/Alertness: Awake/alert Behavior During Therapy: WFL for tasks assessed/performed Overall Cognitive Status: Within Functional Limits for tasks assessed    Extremity/Trunk Assessment Upper Extremity Assessment Upper Extremity Assessment: Generalized weakness (grossly 4-/5)     Mobility Bed Mobility Supine to sit: Min assist General bed mobility comments: used bed rails, hob 30 degrees Transfers Transfers: Sit to/from Stand Sit to Stand: Max assist General transfer comment: assist to rise and steady     Exercise     Balance     End of Session OT - End of Session Activity Tolerance: Patient tolerated treatment well Patient left: in chair;with call bell/phone within reach  [place]ana 10/05/2013, 8:44 AM Lesle Chris, OTR/L [phone] 10/05/2013

## 2013-10-05 NOTE — Progress Notes (Signed)
Physical Therapy Treatment Patient Details Name: [name] [name] MRN: [id number] DOB: June 18, 1946 Today's Date: 10/05/2013 Time: 0962-8366 PT Time Calculation (min): 24 min  PT Assessment / Plan / Recommendation  History of Present Illness [name] [name] is a 68 y.o. male who presents for surgical treatment of osteomyelitis left great toe and 2nd toe. s/p amputation of L great and 2nd toe   PT Comments   Pt with much improved activity tolerance today.  Able to gait with min A with RW.  Continues with decreased balance and impaired activity tolerance, will benefit from CIR.  Follow Up Recommendations  CIR     Does the patient have the potential to tolerate intense rehabilitation     Barriers to Discharge        Equipment Recommendations  None recommended by PT    Recommendations for Other Services Rehab consult  Frequency Min 5X/week   Progress towards PT Goals Progress towards PT goals: Progressing toward goals  Plan Current plan remains appropriate    Precautions / Restrictions Precautions Precautions: Fall Required Braces or Orthoses:  (surgical shoe R foot) Restrictions Weight Bearing Restrictions: Yes LLE Weight Bearing: Non weight bearing   Pertinent Vitals/Pain Pt c/o 3/10 pain in L LE, repositioned and rest as needed    Mobility  Bed Mobility Supine to sit: Min assist General bed mobility comments: used bed rails, hob 30 degrees Transfers Overall transfer level: Needs assistance Equipment used: Rolling walker (2 wheeled) Transfers: Sit to/from Stand Sit to Stand: Mod assist General transfer comment: mod lifting assist, cues for UE placement Ambulation/Gait Ambulation/Gait assistance: Min assist;+2 safety/equipment Ambulation Distance (Feet): 5 Feet (pt gait 5' followed by 10' x 2) Assistive device: Rolling walker (2 wheeled) General Gait Details: pt able to gait 5', 10' x 2 with close chair follow, good maintaining NWB, states he just feels "tired"    Exercises General Exercises - Lower Extremity Short Arc Quad: AROM;20 reps;Left Heel Slides: AROM;Left;20 reps Hip ABduction/ADduction: AROM;Left;20 reps Straight Leg Raises: AROM;Left;20 reps   PT Diagnosis:    PT Problem List:   PT Treatment Interventions:     PT Goals (current goals can now be found in the care plan section) Acute Rehab PT Goals Patient Stated Goal: go for rehab then go home  Visit Information  Last PT Received On: 10/05/13 Assistance Needed: +2 History of Present Illness: [name] [name] is a 68 y.o. male who presents for surgical treatment of osteomyelitis left great toe and 2nd toe. s/p amputation of L great and 2nd toe    Subjective Data  Patient Stated Goal: go for rehab then go home   Cognition  Cognition Arousal/Alertness: Awake/alert Behavior During Therapy: [facility] for tasks assessed/performed Overall Cognitive Status: Within Functional Limits for tasks assessed    Balance     End of Session PT - End of Session Equipment Utilized During Treatment: Gait belt (R surgical shoe) Activity Tolerance: Patient tolerated treatment well Patient left: in chair;with call bell/phone within reach Nurse Communication: Mobility status   GP     [name] [name] 10/05/2013, 11:19 AM

## 2013-10-05 NOTE — Progress Notes (Signed)
Inpatient Diabetes Program Recommendations  AACE/ADA: New Consensus Statement on Inpatient Glycemic Control (2013)  Target Ranges:  Prepandial:   less than 140 mg/dL      Peak postprandial:   less than 180 mg/dL (1-2 hours)      Critically ill patients:  140 - 180 mg/dL   Reason for Visit: Hyperglycemia Diabetes history: Type 2 Outpatient Diabetes medications: metformin 500 bid, Lantus 45 units QHS and Humalog 4 units for hyperglycemia s/s Current orders for Inpatient glycemic control: Lantus 25 units QHS and Novolog moderate tidwc and hs  Inpatient Diabetes Program Recommendations Insulin - Meal Coverage: Add meal coverage insulin - Novolog 4 units tidwc if pt eats >50% meal HgbA1C: HgbA1C to assess glycemic control prior to hospitalization  Note: Will follow. Thank you. Lorenda Peck, RD, LDN, CDE Inpatient Diabetes Coordinator [phone]

## 2013-10-05 NOTE — Consult Note (Signed)
WOC wound consult note Reason for Consult:asked per ortho to evaluate left heel, noted when at bedside patient also reports right toe tip wound.  He reports that his left heel started while at SNF. Reports HHRN that has been changing right great toe tip dressing.Pt s/p amputation of the left great toe/left second toe 10/04/13 Wound type: Left heel Unstageable pressure ulcer Right great toe tip: arterial ulcer Pressure Ulcer POA: Yes Wound bed: Left heel: appear to have some scarring and healing but remains with scattered area of eschar, that are intact and stable, non fluctuant.  Right great toe tip: eschar over the toe tip medial and lateral, stable, non fluctuant   Drainage (amount, consistency, odor) none at either site Periwound: intact with scarring at the left heel  Dressing procedure/placement/frequency: Skin prep wipe to the left heel daily. Prevalon boot for offloading bilaterally as both heels have history of breakdown and high risk for further injury. Paint right great toe with betadine swab daily, allow to air dry and wrap with conform or other similar gauze, secure with stockinet or tape.   Discussed POC with patient and bedside nurse.  Re consult if needed, will not follow at this time. Thanks  Jaymien Landin Kellogg, [place] [phone])

## 2013-10-05 NOTE — Progress Notes (Signed)
   Subjective:  Patient reports no pain.  Objective:   VITALS:   Filed Vitals:   10/04/13 2118 10/05/13 0200 10/05/13 0437 10/05/13 0814  BP: 123/60 116/65 119/67 116/57  Pulse: 75 77 77 90  Temp: 98 F (36.7 C) 97.9 F (36.6 C) 98 F (36.7 C)   TempSrc:      Resp: 16 16 16    SpO2: 100% 100% 100%     Neurologically intact Neurovascular intact Sensation intact distally Intact pulses distally Dorsiflexion/Plantar flexion intact Incision: dressing C/D/I and no drainage No cellulitis present Compartment soft   Lab Results  Component Value Date   WBC 7.3 09/30/2013   HGB 11.0* 09/30/2013   HCT 33.4* 09/30/2013   MCV 74.2* 09/30/2013   PLT 314 09/30/2013     Assessment/Plan: 1 Day Post-Op   Problem List Items Addressed This Visit     Cardiovascular and Mediastinum   NICM- EF 20-25% echo 6/14 (Chronic)   Relevant Medications      carvedilol (COREG) 12.5 MG tablet (Completed)      carvedilol (COREG) tablet 12.5 mg      spironolactone (ALDACTONE) tablet 12.5 mg      atorvastatin (LIPITOR) tablet 10 mg      furosemide (LASIX) tablet 40 mg   SYSTOLIC HEART FAILURE, CHRONIC - Primary (Chronic)   HTN (hypertension) (Chronic)     Endocrine   Diabetes mellitus (Chronic)   Relevant Medications      insulin aspart (novoLOG) injection 0-15 Units      insulin aspart (novoLOG) injection 0-5 Units      insulin detemir (LEVEMIR) injection 25 Units     Other   Hyperlipidemia (Chronic)   Gangrenous toe, Lt toe      Continue doxycycline for soft tissue infection Up with PT/OT DVT ppx - SCDs, ambulation, plavix, asa NWB left lower extremity hospitalist following - appreciate assistance Pain control Discharge planning - CIR   Marianna Payment 10/05/2013, 10:34 AM [phone]

## 2013-10-05 NOTE — Progress Notes (Signed)
Rehab Admissions Coordinator Note:  Patient was screened by [name] for appropriateness for an Inpatient Acute Rehab Consult.  At this time, we are recommending [name] [name].  I checked with insurance carrier.  They will not authorize acute inpatient rehab for current diagnosis.  I have called and let the unit case manager know of insurance decision of SNF.  [name] 10/05/2013, 12:16 PM  I can be reached at [phone].

## 2013-10-05 NOTE — Consult Note (Signed)
[facility] Followup consult note  [name] [name] KGM:[id number] DOB: 1946/01/10 DOA: 10/04/2013 PCP: [name], MD     Impression/Recommendations Chronic osteomyelitis of toe of left foot/  Gangrenous toe, Lt toe/  Foot osteomyelitis, left: - Status post amputation of the second left toe, continue IV antibiotics wound care PT OT further management per orthopedic surgeon.  NICM- EF 64-40% echo 3/47 SYSTOLIC HEART FAILURE, CHRONIC - Mild JVD, resume his Lasix home dose, restrict his diet to 1.2 L a day and KVO IV fluids. - Basic metabolic panel in the morning.  HTN (hypertension) - Controlled on outpatient meds, continue.   Diabetes mellitus - Continue sliding scale insulin at Levemir 5 continue CBGs a.c. and at bedtime.   Hyperlipidemia - Continue statin therapy.   [facility] in am  Code Status: Full Family Communication: None at bedside Disposition Plan: Per primary Team.     Procedures: Procedure:  1. Amputation of left great toe metatarsophalangeal joint.  2. Amputation of left second toe through metatarsophalangeal joint   Antibiotics:  Doxycycline  HPI/Subjective: Patient sitting up in chair, denies chest pain no nausea vomiting Objective: Filed Vitals:   10/05/13 0814  BP: 116/57  Pulse: 90  Temp:   Resp:     Intake/Output Summary (Last 24 hours) at 10/05/13 1205 Last data filed at 10/05/13 0437  Gross per 24 hour  Intake  802.5 ml  Output   2075 ml  Net -1272.5 ml   There were no vitals filed for this visit.  Exam:  General: alert & answers simple questions appropriate In NAD Cardiovascular: RRR, nl S1 s2 Respiratory: CTAB Abdomen: soft +BS NT/ND, no masses palpable Extremities: No cyanosis and no edema    Data Reviewed: Basic Metabolic Panel:  Recent Labs Lab 09/30/13 1050 10/05/13 0416  NA 141 143  K 4.3 3.6*  CL 99 102  CO2 24 26  GLUCOSE 153* 181*  BUN  24* 17  CREATININE 1.13 1.01  CALCIUM 9.7 8.8   Liver Function Tests: No results found for this basename: AST, ALT, ALKPHOS, BILITOT, PROT, ALBUMIN,  in the last 168 hours No results found for this basename: LIPASE, AMYLASE,  in the last 168 hours No results found for this basename: AMMONIA,  in the last 168 hours CBC:  Recent Labs Lab 09/30/13 1050  WBC 7.3  HGB 11.0*  HCT 33.4*  MCV 74.2*  PLT 314   Cardiac Enzymes: No results found for this basename: CKTOTAL, CKMB, CKMBINDEX, TROPONINI,  in the last 168 hours BNP (last 3 results) No results found for this basename: PROBNP,  in the last 8760 hours CBG:  Recent Labs Lab 10/04/13 0814 10/04/13 1119 10/04/13 1644 10/04/13 2129 10/05/13 0621  GLUCAP 146* 188* 167* 209* 146*    No results found for this or any previous visit (from the past 240 hour(s)).   Studies: No results found.  Scheduled Meds: . atorvastatin  10 mg Oral Daily  . carvedilol  12.5 mg Oral BID WC  . clopidogrel  75 mg Oral Q breakfast  . doxycycline  100 mg Oral Q12H  . furosemide  40 mg Oral BID  . insulin aspart  0-15 Units Subcutaneous TID WC  . insulin aspart  0-5 Units Subcutaneous QHS  . insulin detemir  25 Units Subcutaneous QHS  . multivitamin with minerals  1 tablet Oral Daily  . nutrition supplement (JUVEN)  1 packet Oral BID BM  . pantoprazole  40 mg  Oral Daily  . senna  1 tablet Oral BID  . spironolactone  12.5 mg Oral Daily   Continuous Infusions:   Principal Problem:   Chronic osteomyelitis of toe of left foot Active Problems:   NICM- EF 20-25% echo 3/24   SYSTOLIC HEART FAILURE, CHRONIC   HTN (hypertension)   Diabetes mellitus   Hyperlipidemia   Gangrenous toe, Lt toe   Foot osteomyelitis, left    Time spent: [facility] Pager [phone]. If 7PM-7AM, please contact night-coverage at [email or web address], password [facility] 10/05/2013, 12:05 PM  LOS: 1 day

## 2013-10-05 NOTE — Progress Notes (Signed)
INITIAL NUTRITION ASSESSMENT  DOCUMENTATION CODES Per approved criteria  -Not Applicable   INTERVENTION:  One package of Juven PO BID  MVI daily  NUTRITION DIAGNOSIS: Increased nutrient needs related to wounds as evidenced by estimated needs.   Goal: Pt to meet >/= 90% of their estimated nutrition needs   Monitor:  PO intake, weight trend, supplement acceptance, labs  Reason for Assessment: Pt identified as at nutrition risk on the Malnutrition Screen Tool  68 y.o. male  Admitting Dx: Chronic osteomyelitis of toe of left foot  ASSESSMENT: Pt admitted for surgical treatment of osteomyelitis of his left great toe and second toe (2/10).  Per pt he has had no recent weight changes and had a good appetite PTA. Pt consuming 100% of his meals. Pt feels that his blood sugars have been doing well at home. Pt not aware of d/c plans, per RN likely some type of rehab before home.  Nutrition-focused physical exam WNL.   Height: Ht Readings from Last 1 Encounters:  09/30/13 5\' 9"  (1.753 m)    Weight: Wt Readings from Last 1 Encounters:  09/30/13 177 lb 14.4 oz (80.695 kg)    Ideal Body Weight: 72.7 kg   % Ideal Body Weight: 111%  Wt Readings from Last 10 Encounters:  09/30/13 177 lb 14.4 oz (80.695 kg)  09/21/13 181 lb (82.101 kg)  09/13/13 192 lb 7.4 oz (87.3 kg)  09/13/13 192 lb 7.4 oz (87.3 kg)  09/06/13 185 lb (83.915 kg)  08/30/13 178 lb (80.74 kg)  07/26/13 188 lb 6.4 oz (85.458 kg)  02/18/13 179 lb 7.3 oz (81.4 kg)  07/20/12 204 lb (92.534 kg)  06/19/11 205 lb 12.8 oz (93.35 kg)    Usual Body Weight: 178 lb   % Usual Body Weight: 100%  BMI:  26.2 - overweight   Estimated Nutritional Needs: Kcal: 1900-2100 Protein: 95-110 grams Fluid: 1.2 L/day  Skin:  Left heel unstageable Incision toe - amputation of left great toe and left second toe 2/10 Right great toe tip with arterial ulcer  Diet Order: Carb Control Meal Completion: 100%  EDUCATION  NEEDS: -No education needs identified at this time   Intake/Output Summary (Last 24 hours) at 10/05/13 1011 Last data filed at 10/05/13 0437  Gross per 24 hour  Intake 1042.5 ml  Output   2075 ml  Net -1032.5 ml    Last BM: 2/9   Labs:   Recent Labs Lab 09/30/13 1050 10/05/13 0416  NA 141 143  K 4.3 3.6*  CL 99 102  CO2 24 26  BUN 24* 17  CREATININE 1.13 1.01  CALCIUM 9.7 8.8  GLUCOSE 153* 181*    CBG (last 3)   Recent Labs  10/04/13 1644 10/04/13 2129 10/05/13 0621  GLUCAP 167* 209* 146*   No results found for this basename: HGBA1C   Scheduled Meds: . atorvastatin  10 mg Oral Daily  . carvedilol  12.5 mg Oral BID WC  . clopidogrel  75 mg Oral Q breakfast  . doxycycline  100 mg Oral Q12H  . furosemide  40 mg Oral BID  . insulin aspart  0-15 Units Subcutaneous TID WC  . insulin aspart  0-5 Units Subcutaneous QHS  . insulin detemir  25 Units Subcutaneous QHS  . pantoprazole  40 mg Oral Daily  . senna  1 tablet Oral BID  . spironolactone  12.5 mg Oral Daily    Continuous Infusions:   Past Medical History  Diagnosis Date  . GERD (gastroesophageal  reflux disease)   . Obesity   . Cardiomyopathy, nonischemic     a. Cath 2003: mild nonobstructive CAD, EF 25% at that time.  . Hypertension   . Chronic systolic CHF (congestive heart failure)     a. NICM EF 25% dating back to at least 2003.  [name] PAF (paroxysmal atrial fibrillation)     a. Noted on ICD interrogation 2012;  b. coumadin d/c'd 01/2013.  [name] NSVT (nonsustained ventricular tachycardia)     a. Noted on ICD interrogation in 2011.  [name] LBBB (left bundle branch block)   . Kidney stone   . Critical lower limb ischemia   . High cholesterol   . PAD (peripheral artery disease)     a. 08/2013 Periph Angio/PTA: Abd Ao nl, RLE- 3v runoff, PT diff dzs, AT 90p, LLE 2v runoff, PT 100, AT 47m (diamondback ORA/chocolate balloon PTA).  . Type II diabetes mellitus   . Uncontrolled pain, Lt toe 09/21/2013  .  Gangrenous toe, Lt toe 09/21/2013  . CAD (coronary artery disease)     a. Nonobstructive by cath 09/2001.  [name] Automatic implantable cardioverter-defibrillator in situ     a. 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  . Dementia     Past Surgical History  Procedure Laterality Date  . Lithotripsy  2001  . Cervical spine surgery  1994  . Cardiac defibrillator placement  06/2009    WITH GENERATOR REPLACED; BiV ICD  . US echocardiography  03/21/2008    EF 30-35%  . Cardiovascular stress test  03/20/2009    EF 33%  . Transluminal atherectomy tibial artery Left 09/12/2013  . Cardiac catheterization  10/01/2001    THERE WAS GLOBAL HYPOKINESIS AND EF 25%. THERE APPEARED TO BE GLOBAL DECREASE IN WALL MOTION  . Toe amputation  10/04/2013    LEFT GREAT TOE AND 4TH TOE   /   DR Halford Chessman RD, [place], [place] Pager [phone] After Hours Pager

## 2013-10-06 ENCOUNTER — Encounter (HOSPITAL_COMMUNITY): Payer: Self-pay | Admitting: Orthopaedic Surgery

## 2013-10-06 LAB — BASIC METABOLIC PANEL
BUN: 17 mg/dL (ref 6–23)
CHLORIDE: 98 meq/L (ref 96–112)
CO2: 27 mEq/L (ref 19–32)
CREATININE: 0.89 mg/dL (ref 0.50–1.35)
Calcium: 8.7 mg/dL (ref 8.4–10.5)
GFR calc Af Amer: >90 mL/min (ref >90)
GFR calc non Af Amer: 85 mL/min — ABNORMAL LOW (ref >90)
Glucose, Bld: 197 mg/dL — ABNORMAL HIGH (ref 70–99)
POTASSIUM: 3.5 meq/L — AB (ref 3.7–5.3)
Sodium: 138 mEq/L (ref 137–147)

## 2013-10-06 LAB — GLUCOSE, CAPILLARY
GLUCOSE-CAPILLARY: 247 mg/dL — AB (ref 70–99)
GLUCOSE-CAPILLARY: 155 mg/dL — AB (ref 70–99)
GLUCOSE-CAPILLARY: 269 mg/dL — AB (ref 70–99)
GLUCOSE-CAPILLARY: 215 mg/dL — AB (ref 70–99)
GLUCOSE-CAPILLARY: 209 mg/dL — AB (ref 70–99)
Glucose-Capillary: 180 mg/dL — ABNORMAL HIGH (ref 70–99)

## 2013-10-06 MED ORDER — POTASSIUM CHLORIDE CRYS ER 20 MEQ PO TBCR
20.0000 meq | EXTENDED_RELEASE_TABLET | Freq: Every day | ORAL | Status: DC
  Administered 2013-10-06 – 2013-10-07 (×2): 20 meq via ORAL
  Filled 2013-10-06 (×2): qty 1

## 2013-10-06 MED ORDER — HYDROCODONE-ACETAMINOPHEN 5-325 MG PO TABS: 1.0000 | ORAL_TABLET | Freq: Four times a day (QID) | ORAL | Status: DC | PRN

## 2013-10-06 NOTE — Progress Notes (Signed)
Subjective: [name] [name] is feeling well this morning. Good spirits. No pain. No shortness of breath  Objective: Weight change:   Intake/Output Summary (Last 24 hours) at 10/06/13 0815 Last data filed at 10/06/13 0600  Gross per 24 hour  Intake    850 ml  Output   2750 ml  Net  -1900 ml   Filed Vitals:   10/05/13 1300 10/05/13 1739 10/05/13 2202 10/06/13 0637  BP: 105/58 126/61 132/67 136/65  Pulse: 78 89 91 85  Temp: 98.1 F (36.7 C)  98.7 F (37.1 C) 98.2 F (36.8 C)  TempSrc:      Resp: _0 SpO2: 99%  100% 99%    General: alert & answers simple questions appropriate In NAD  Cardiovascular: RRR, nl S1 s2  Respiratory: CTAB  Abdomen: soft +BS NT/ND, no masses palpable  Extremities: No cyanosis and no edema - status post left first and second toe amputations  Lab Results: Results for orders placed during the hospital encounter of 10/04/13 (from the past 48 hour(s))  GLUCOSE, CAPILLARY     Status: Abnormal   Collection Time    10/04/13 11:19 AM      Result Value Ref Range   Glucose-Capillary 188 (*) 70 - 99 mg/dL   Comment 1 Notify RN     Comment 2 Documented in Chart    GLUCOSE, CAPILLARY     Status: Abnormal   Collection Time    10/04/13  4:44 PM      Result Value Ref Range   Glucose-Capillary 167 (*) 70 - 99 mg/dL  GLUCOSE, CAPILLARY     Status: Abnormal   Collection Time    10/04/13  9:29 PM      Result Value Ref Range   Glucose-Capillary 209 (*) 70 - 99 mg/dL   Comment 1 Notify RN    BASIC METABOLIC PANEL     Status: Abnormal   Collection Time    10/05/13  4:16 AM      Result Value Ref Range   Sodium 143  137 - 147 mEq/L   Potassium 3.6 (*) 3.7 - 5.3 mEq/L   Chloride 102  96 - 112 mEq/L   CO2 26  19 - 32 mEq/L   Glucose, Bld 181 (*) 70 - 99 mg/dL   BUN 17  6 - 23 mg/dL   Creatinine, Ser 1.01  0.50 - 1.35 mg/dL   Calcium 8.8  8.4 - 10.5 mg/dL   GFR calc non Af Amer 74 (*) >90 mL/min   GFR calc Af Amer 86 (*) >90 mL/min   Comment: (NOTE)      The eGFR has been calculated using the CKD EPI equation.     This calculation has not been validated in all clinical situations.     eGFR's persistently <90 mL/min signify possible Chronic Kidney     Disease.  GLUCOSE, CAPILLARY     Status: Abnormal   Collection Time    10/05/13  6:21 AM      Result Value Ref Range   Glucose-Capillary 146 (*) 70 - 99 mg/dL  GLUCOSE, CAPILLARY     Status: Abnormal   Collection Time    10/05/13 12:18 PM      Result Value Ref Range   Glucose-Capillary 210 (*) 70 - 99 mg/dL   Comment 1 Documented in Chart     Comment 2 Notify RN    GLUCOSE, CAPILLARY     Status: Abnormal   Collection Time  10/05/13  4:34 PM      Result Value Ref Range   Glucose-Capillary 207 (*) 70 - 99 mg/dL   Comment 1 Documented in Chart     Comment 2 Notify RN    GLUCOSE, CAPILLARY     Status: Abnormal   Collection Time    10/05/13 10:14 PM      Result Value Ref Range   Glucose-Capillary 247 (*) 70 - 99 mg/dL  BASIC METABOLIC PANEL     Status: Abnormal   Collection Time    10/06/13  4:08 AM      Result Value Ref Range   Sodium 138  137 - 147 mEq/L   Potassium 3.5 (*) 3.7 - 5.3 mEq/L   Chloride 98  96 - 112 mEq/L   CO2 27  19 - 32 mEq/L   Glucose, Bld 197 (*) 70 - 99 mg/dL   BUN 17  6 - 23 mg/dL   Creatinine, Ser 0.89  0.50 - 1.35 mg/dL   Calcium 8.7  8.4 - 10.5 mg/dL   GFR calc non Af Amer 85 (*) >90 mL/min   GFR calc Af Amer >90  >90 mL/min   Comment: (NOTE)     The eGFR has been calculated using the CKD EPI equation.     This calculation has not been validated in all clinical situations.     eGFR's persistently <90 mL/min signify possible Chronic Kidney     Disease.  GLUCOSE, CAPILLARY     Status: Abnormal   Collection Time    10/06/13  7:02 AM      Result Value Ref Range   Glucose-Capillary 180 (*) 70 - 99 mg/dL  GLUCOSE, CAPILLARY     Status: Abnormal   Collection Time    10/06/13  7:46 AM      Result Value Ref Range   Glucose-Capillary 155 (*) 70 - 99  mg/dL    Studies/Results: No results found. Medications: Scheduled Meds: . atorvastatin  10 mg Oral Daily  . carvedilol  12.5 mg Oral BID WC  . clopidogrel  75 mg Oral Q breakfast  . doxycycline  100 mg Oral Q12H  . furosemide  40 mg Oral BID  . insulin aspart  0-15 Units Subcutaneous TID WC  . insulin aspart  0-5 Units Subcutaneous QHS  . insulin detemir  25 Units Subcutaneous QHS  . multivitamin with minerals  1 tablet Oral Daily  . nutrition supplement (JUVEN)  1 packet Oral BID BM  . pantoprazole  40 mg Oral Daily  . senna  1 tablet Oral BID  . spironolactone  12.5 mg Oral Daily   Continuous Infusions:  PRN Meds:.diphenhydrAMINE, HYDROcodone-acetaminophen, methocarbamol (ROBAXIN) IV, methocarbamol, metoCLOPramide (REGLAN) injection, metoCLOPramide, morphine injection, ondansetron (ZOFRAN) IV, ondansetron, oxyCODONE, polyethylene glycol, sorbitol  Assessment/Plan: Principal Problem:   Chronic osteomyelitis of toe of left foot Active Problems:   NICM- EF 20-25% echo 1/61   SYSTOLIC HEART FAILURE, CHRONIC   HTN (hypertension)   Diabetes mellitus   Hyperlipidemia   Gangrenous toe, Lt toe   Foot osteomyelitis, left  Chronic osteomyelitis of toe of left foot/ Gangrenous toe, Lt toe/ Foot osteomyelitis, left:  - Status post amputation of the second left toe, continue IV antibiotics wound care PT OT further management per orthopedic surgeon.  NICM- EF 09-60% echo 4/54 SYSTOLIC HEART FAILURE, CHRONIC  - Mild JVD, continue Lasix, continue fluid restriction at 1.2 L a day and DC IV fluids - Basic metabolic panel in the morning.  HTN (hypertension)  - Controlled on outpatient meds, continue.  Diabetes mellitus  - Continue sliding scale insulin at Levemir 5 continue CBGs a.c. and at bedtime.  CBGs okay Hyperlipidemia  - Continue statin therapy. Hypokalemia - replace by mouth and check basic metabolic profile in a.m.   LOS: 2 days   [name], MD 10/06/2013, 8:15  AM

## 2013-10-06 NOTE — Plan of Care (Signed)
Problem: Consults Goal: Skin Care Protocol Initiated - if Braden Score 18 or less If consults are not indicated, leave blank or document N/A  Outcome: Progressing Wounds dressed according to orders

## 2013-10-06 NOTE — Clinical Social Work Placement (Addendum)
Clinical Social Work Department  CLINICAL SOCIAL WORK PLACEMENT NOTE   [name]  Account Number: [id number]  Admit date: 10/04/13 Clinical Social Worker: Rhea Pink LCSWA Date/time: 10/06/2013 11:30 AM  Clinical Social Work is seeking post-discharge placement for this patient at the following level of care: SKILLED NURSING (*CSW will update this form in Epic as items are completed)  10/06/2013 Patient/family provided with [place] Department of Clinical Social Work's list of facilities offering this level of care within the geographic area requested by the patient (or if unable, by the patient's family).  10/06/2013  Patient/family informed of their freedom to choose among providers that offer the needed level of care, that participate in Medicare, Medicaid or managed care program needed by the patient, have an available bed and are willing to accept the patient.  10/06/2013  Patient/family informed of [facility]' ownership interest in [facility], as well as of the fact that they are under no obligation to receive care at this facility.  PASARR submitted to EDS on Pre-existing PASARR number received from EDS on  FL2 transmitted to all facilities in geographic area requested by pt/family on 10/06/2013  FL2 transmitted to all facilities within larger geographic area on  Patient informed that his/her managed care company has contracts with or will negotiate with certain facilities, including the following:  Patient/family informed of bed offers received: 10/06/13 Patient chooses bed at [facility] Physician recommends and patient chooses bed at  Patient to be transferred to on 10/06/12 Patient to be transferred to facility by [facility] The following physician request were entered in Epic:  Additional Comments:

## 2013-10-06 NOTE — Clinical Social Work Psychosocial (Signed)
Clinical Social Work Department  BRIEF PSYCHOSOCIAL ASSESSMENT  [name]  Account Number: [id number]  Admit date: 10/04/12 Clinical Social Worker Rhea Pink, MSW Date/Time: 10/06/2013 11:30 Referred by: Physician Date Referred:  Referred for   SNF Placement   Other Referral:  Interview type: Patient and Pt's wife Other interview type: PSYCHOSOCIAL DATA  Living Status:wife Admitted from facility:  Level of care:  Primary support name: [name] [name] Primary support relationship to patient: Spouse Degree of support available:  Strong and vested  CURRENT CONCERNS  Current Concerns   Post-Acute Placement   Other Concerns:  SOCIAL WORK ASSESSMENT / PLAN  CSW met with pt re: PT recommendation for CIR vs SNF. Patient does not have SNF benefits   Pt lives with wife  CSW explained placement process and answered questions.   Pt reports [facility]  as her preference    CSW completed FL2 and initiated SNF search.     Assessment/plan status: Information/Referral to [employer]  Other assessment/ plan:  Information/referral to community resources:  SNF     PATIENT'S/FAMILY'S RESPONSE TO PLAN OF CARE:  Pt  reports he is agreeable to ST SNF in order to increase strength and independence with mobility prior to returning home  Patietn did state that he was disappointed he would not be able to go to CIR. Patient however expressed his understanding Pt verbalized understanding of placement process and appreciation for CSW assist.   Rhea Pink, MSW [phone]

## 2013-10-06 NOTE — Progress Notes (Signed)
Inpatient Diabetes Program Recommendations  AACE/ADA: New Consensus Statement on Inpatient Glycemic Control (2013)  Target Ranges:  Prepandial:   less than 140 mg/dL      Peak postprandial:   less than 180 mg/dL (1-2 hours)      Critically ill patients:  140 - 180 mg/dL   Reason for Visit:  Hyperglycemia  Results for [name] [name], [name] [name] (MRN [id number]) as of 10/06/2013 14:52  Ref. Range 10/05/2013 16:34 10/05/2013 22:14 10/06/2013 07:02 10/06/2013 07:46 10/06/2013 11:10  Glucose-Capillary Latest Range: 70-99 mg/dL 207 (H) 247 (H) 180 (H) 155 (H) 269 (H)   AM blood sugars much improved. Still needs meal coverage for post-prandial highs.   Inpatient Diabetes Program Recommendations Insulin - Meal Coverage: Please consider addition of meal coverage insulin - Novolog 4 units tidwc HgbA1C: HgbA1C to assess glycemic control prior to hospitalization  Will follow. Thank you. Lorenda Peck, RD, LDN, CDE Inpatient Diabetes Coordinator [phone]

## 2013-10-06 NOTE — Discharge Instructions (Signed)
Keep dressing on until follow up appointment Strict non weight bearing to operative extremity

## 2013-10-06 NOTE — Progress Notes (Signed)
   Subjective:  Patient reports no pain.  Objective:   VITALS:   Filed Vitals:   10/05/13 1300 10/05/13 1739 10/05/13 2202 10/06/13 0637  BP: 105/58 126/61 132/67 136/65  Pulse: 78 89 91 85  Temp: 98.1 F (36.7 C)  98.7 F (37.1 C) 98.2 F (36.8 C)  TempSrc:      Resp: 16  18 18   SpO2: 99%  100% 99%    Neurologically intact Neurovascular intact Sensation intact distally Intact pulses distally Dorsiflexion/Plantar flexion intact Incision: dressing C/D/I and no drainage No cellulitis present Compartment soft   Lab Results  Component Value Date   WBC 7.3 09/30/2013   HGB 11.0* 09/30/2013   HCT 33.4* 09/30/2013   MCV 74.2* 09/30/2013   PLT 314 09/30/2013     Assessment/Plan: 2 Days Post-Op   Problem List Items Addressed This Visit     Cardiovascular and Mediastinum   NICM- EF 20-25% echo 6/14 (Chronic)   Relevant Medications      carvedilol (COREG) 12.5 MG tablet (Completed)      carvedilol (COREG) tablet 12.5 mg      spironolactone (ALDACTONE) tablet 12.5 mg      atorvastatin (LIPITOR) tablet 10 mg      furosemide (LASIX) tablet 40 mg   SYSTOLIC HEART FAILURE, CHRONIC - Primary (Chronic)   Relevant Orders      Non weight bearing   HTN (hypertension) (Chronic)     Endocrine   Diabetes mellitus (Chronic)   Relevant Medications      insulin aspart (novoLOG) injection 0-15 Units      insulin aspart (novoLOG) injection 0-5 Units      insulin detemir (LEVEMIR) injection 25 Units     Musculoskeletal and Integument   *Chronic osteomyelitis of toe of left foot   Relevant Medications      ceFAZolin (ANCEF) IVPB 2 g/50 mL premix (Completed)     Other   Hyperlipidemia (Chronic)   Gangrenous toe, Lt toe      Continue doxycycline for soft tissue infection Up with PT/OT DVT ppx - SCDs, ambulation, plavix, asa NWB left lower extremity hospitalist following - appreciate assistance Pain control Discharge planning - CIR   Marianna Payment 10/06/2013, 8:25  AM [phone]

## 2013-10-06 NOTE — Plan of Care (Signed)
Problem: Phase II Progression Outcomes Goal: Pain controlled Outcome: Progressing Pain control with minimal interventions

## 2013-10-06 NOTE — Progress Notes (Signed)
Physical Therapy Treatment Patient Details Name: YUEPHENG SCHALLER MRN: [id number] DOB: Oct 31, 1945 Today's Date: 10/06/2013 Time: 6073-7106 PT Time Calculation (min): 15 min  PT Assessment / Plan / Recommendation  History of Present Illness Costas Sena Dhawan is a 68 y.o. male who presents for surgical treatment of osteomyelitis left great toe and 2nd toe. s/p amputation of L great and 2nd toe   PT Comments   Pt. Able to ambulate only short distances at NWB level L LE before he fatigues and needs seated rest.  Have changed DC dispo to SNF as it has been determined that insurance will not cover a CIR say with his current diagnosis.    Follow Up Recommendations  SNF;Supervision/Assistance - 24 hour     Does the patient have the potential to tolerate intense rehabilitation     Barriers to Discharge        Equipment Recommendations  None recommended by PT    Recommendations for Other Services    Frequency Min 5X/week   Progress towards PT Goals Progress towards PT goals: Progressing toward goals  Plan Discharge plan needs to be updated    Precautions / Restrictions Precautions Precautions: Fall Restrictions Weight Bearing Restrictions: Yes LLE Weight Bearing: Non weight bearing   Pertinent Vitals/Pain See vitals tab     Mobility  Bed Mobility Overal bed mobility:  (not assessed; pt. up in recliner) Transfers Overall transfer level: Needs assistance Equipment used: Rolling walker (2 wheeled) Transfers: Sit to/from Stand Sit to Stand: +2 safety/equipment;Mod assist;Min assist General transfer comment: Pt. initially requiring 2 mod assist to stand with 3 trials, but by 3rd trial, needed min of 1 to stand with assist to power up out of recliner Ambulation/Gait Ambulation/Gait assistance: Min assist;+2 safety/equipment Ambulation Distance (Feet): 5 Feet (followed by 5' then 5') Assistive device: Rolling walker (2 wheeled) Gait Pattern/deviations: Step-to pattern;Trunk  flexed General Gait Details: Pt. demo's decreasing step length as he tires and needs seated rests after each 5'    Exercises     PT Diagnosis:    PT Problem List:   PT Treatment Interventions:     PT Goals (current goals can now be found in the care plan section)    Visit Information  Last PT Received On: 10/06/13 Assistance Needed: +2 History of Present Illness: Waldron Gerry Arpino is a 68 y.o. male who presents for surgical treatment of osteomyelitis left great toe and 2nd toe. s/p amputation of L great and 2nd toe    Subjective Data  Subjective: Pt. requesting juice; RN suggested pt. drink diet soday instead of juice and he was agreeable.  Diet soda provided    Cognition  Cognition Arousal/Alertness: Awake/alert Behavior During Therapy: WFL for tasks assessed/performed Overall Cognitive Status: Within Functional Limits for tasks assessed    Balance     End of Session PT - End of Session Equipment Utilized During Treatment: Gait belt (R surgical shoe) Activity Tolerance: Patient tolerated treatment well;Patient limited by fatigue Patient left: in chair;with call bell/phone within reach;with family/visitor present Nurse Communication: Mobility status   GP     Ladona Ridgel 10/06/2013, 2:16 PM Gerlean Ren PT [facility] [place] [phone]

## 2013-10-06 NOTE — Discharge Summary (Signed)
Physician Discharge Summary      Patient ID: [name] [name] MRN: [id number] DOB/AGE: [birth year] 70 y.o.  Admit date: 10/04/2013 Discharge date: 10/07/2013  Admission Diagnoses:  Chronic osteomyelitis of toe of left foot  Discharge Diagnoses:  Principal Problem:   Chronic osteomyelitis of toe of left foot Active Problems:   NICM- EF 20-25% echo 0/01   SYSTOLIC HEART FAILURE, CHRONIC   HTN (hypertension)   Diabetes mellitus   Hyperlipidemia   Gangrenous toe, Lt toe   Foot osteomyelitis, left   Past Medical History  Diagnosis Date  . GERD (gastroesophageal reflux disease)   . Obesity   . Cardiomyopathy, nonischemic     a. Cath 2003: mild nonobstructive CAD, EF 25% at that time.  . Hypertension   . Chronic systolic CHF (congestive heart failure)     a. NICM EF 25% dating back to at least 2003.  [name] PAF (paroxysmal atrial fibrillation)     a. Noted on ICD interrogation 2012;  b. coumadin d/c'd 01/2013.  [name] NSVT (nonsustained ventricular tachycardia)     a. Noted on ICD interrogation in 2011.  [name] LBBB (left bundle branch block)   . Kidney stone   . Critical lower limb ischemia   . High cholesterol   . PAD (peripheral artery disease)     a. 08/2013 Periph Angio/PTA: Abd Ao nl, RLE- 3v runoff, PT diff dzs, AT 90p, LLE 2v runoff, PT 100, AT 59m (diamondback ORA/chocolate balloon PTA).  . Type II diabetes mellitus   . Uncontrolled pain, Lt toe 09/21/2013  . Gangrenous toe, Lt toe 09/21/2013  . CAD (coronary artery disease)     a. Nonobstructive by cath 09/2001.  [name] Automatic implantable cardioverter-defibrillator in situ     a. 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  . Dementia     Surgeries: Procedure(s): LEFT GREAT TOE AND SECOND TOE AMPUTATION on 10/04/2013   Consultants (if any): Treatment Team:  Josetta Huddle, MD  Discharged Condition: Improved  Hospital Course: [name] [name] is an 68 y.o. male who was admitted 10/04/2013 with a  diagnosis of Chronic osteomyelitis of toe of left foot and went to the operating room on 10/04/2013 and underwent the above named procedures.    He was given perioperative antibiotics:      Anti-infectives   Start     Dose/Rate Route Frequency Ordered Stop   10/04/13 1000  doxycycline (VIBRA-TABS) tablet 100 mg     100 mg Oral Every 12 hours 10/04/13 0923     10/04/13 0600  ceFAZolin (ANCEF) IVPB 2 g/50 mL premix     2 g 100 mL/hr over 30 Minutes Intravenous On call to O.R. 10/03/13 1431 10/04/13 0727    .  He was given sequential compression devices, early ambulation, and aspirin and plavix for DVT prophylaxis.  He benefited maximally from the hospital stay and there were no complications.    Recent vital signs:  Filed Vitals:   10/07/13 0636  BP: 125/64  Pulse: 86  Temp: 97.9 F (36.6 C)  Resp: 18    Recent laboratory studies:  Lab Results  Component Value Date   HGB 11.0* 09/30/2013   HGB 9.9* 09/13/2013   HGB 11.5* 09/06/2013   Lab Results  Component Value Date   WBC 7.3 09/30/2013   PLT 314 09/30/2013   Lab Results  Component Value Date   INR 1.03 09/06/2013   Lab Results  Component Value Date   NA 140 10/07/2013  K 3.8 10/07/2013   CL 101 10/07/2013   CO2 28 10/07/2013   BUN 15 10/07/2013   CREATININE 0.87 10/07/2013   GLUCOSE 194* 10/07/2013    Discharge Medications:     Medication List         aspirin EC 81 MG tablet  Take 81 mg by mouth daily.     atorvastatin 10 MG tablet  Commonly known as:  LIPITOR  Take 10 mg by mouth daily.     carvedilol 12.5 MG tablet  Commonly known as:  COREG  Take 12.5 mg by mouth 2 (two) times daily with a meal.     clopidogrel 75 MG tablet  Commonly known as:  PLAVIX  Take 1 tablet (75 mg total) by mouth daily with breakfast.     doxycycline 100 MG tablet  Commonly known as:  VIBRA-TABS  Take 100 mg by mouth 2 (two) times daily. Toe wound. Started on 09/02/13     esomeprazole 40 MG capsule  Commonly known as:   NEXIUM  Take 40 mg by mouth daily as needed (acid reflux).     furosemide 40 MG tablet  Commonly known as:  LASIX  Take 1 tablet (40 mg total) by mouth 2 (two) times daily.     Hydrocodone-Acetaminophen 5-300 MG Tabs  Take 1 tablet by mouth every 4 (four) hours as needed (for toe pain).     HYDROcodone-acetaminophen 5-325 MG per tablet  Commonly known as:  NORCO  Take 1-2 tablets by mouth every 6 (six) hours as needed.     HYDROcodone-acetaminophen 5-325 MG per tablet  Commonly known as:  NORCO  Take 1-2 tablets by mouth every 6 (six) hours as needed.     insulin detemir 100 UNIT/ML injection  Commonly known as:  LEVEMIR  Inject 45 Units into the skin at bedtime.     metFORMIN 500 MG tablet  Commonly known as:  GLUCOPHAGE  Take 1 tablet (500 mg total) by mouth 2 (two) times daily.     MULTIVITAMIN PO  Take 1 tablet by mouth daily.     NOVOLOG 100 UNIT/ML injection  Generic drug:  insulin aspart  Inject 4 Units into the skin as needed for high blood sugar.     silver sulfADIAZINE 1 % cream  Commonly known as:  SILVADENE  Apply 1 application topically daily.     spironolactone 25 MG tablet  Commonly known as:  ALDACTONE  Take 0.5 tablets (12.5 mg total) by mouth daily.     vitamin E 1000 UNIT capsule  Generic drug:  vitamin E  Take 1,000 Units by mouth daily.        Diagnostic Studies: No results found.  Disposition: 01-Home or Self Care  Discharge Orders   Future Appointments Provider Department Dept Phone   10/27/2013 8:10 AM [facility] [place] [facility] [phone]   Future Orders Complete By Expires   Non weight bearing  As directed    Questions:     Laterality:     Extremity:     Non weight bearing  As directed    Questions:     Laterality:     Extremity:        Follow-up Information   Follow up with Marianna Payment, MD In 1 week.   Specialty:  Orthopedic Surgery   Contact information:   [address] [place] [address] [phone]        Signed: Marianna Payment 10/07/2013,  12:03 PM

## 2013-10-07 LAB — BASIC METABOLIC PANEL
BUN: 15 mg/dL (ref 6–23)
CALCIUM: 9 mg/dL (ref 8.4–10.5)
CHLORIDE: 101 meq/L (ref 96–112)
CO2: 28 mEq/L (ref 19–32)
CREATININE: 0.87 mg/dL (ref 0.50–1.35)
GFR, EST NON AFRICAN AMERICAN: 86 mL/min — AB (ref >90)
Glucose, Bld: 194 mg/dL — ABNORMAL HIGH (ref 70–99)
Potassium: 3.8 mEq/L (ref 3.7–5.3)
Sodium: 140 mEq/L (ref 137–147)

## 2013-10-07 LAB — GLUCOSE, CAPILLARY
Glucose-Capillary: 174 mg/dL — ABNORMAL HIGH (ref 70–99)
Glucose-Capillary: 232 mg/dL — ABNORMAL HIGH (ref 70–99)

## 2013-10-07 MED ORDER — INSULIN DETEMIR 100 UNIT/ML ~~LOC~~ SOLN
28.0000 [IU] | Freq: Every day | SUBCUTANEOUS | Status: DC
  Filled 2013-10-07: qty 0.28

## 2013-10-07 NOTE — Progress Notes (Signed)
Discharge packet given to patient. Patient hemodynamically stable. Report called to [facility] No questions or concerns. Patient discharged via wheelchair with discharge packet and personal belongings

## 2013-10-07 NOTE — Progress Notes (Signed)
I have seen patient in Dr. Inda Merlin' stead this morning  Assessment/Plan: Principal Problem:   Chronic osteomyelitis of toe of left foot - s/p surgery Active Problems:   NICM- EF 20-25% echo 2/63   SYSTOLIC HEART FAILURE, CHRONIC - compensated   HTN (hypertension)   Diabetes mellitus - sugars still a little high. Note diabetic educators recommendation for mealtime Novolog. To better make transition home (whenever that is), I will increase Levemir at this point and see if that accomplishes the same thing   Hyperlipidemia   Gangrenous toe, Lt toe   Foot osteomyelitis, left   Subjective: Feels okay. Breathing is fine. No real complaints   Objective:  Vital Signs: Filed Vitals:   10/05/13 2202 10/06/13 0637 10/06/13 1500 10/06/13 2051  BP: 132/67 136/65 115/60 120/62  Pulse: 91 85 89 82  Temp: 98.7 F (37.1 C) 98.2 F (36.8 C) 97.7 F (36.5 C) 97.8 F (36.6 C)  TempSrc:      Resp: 18 18 20 18   SpO2: 100% 99% 100% 99%     EXAM: Laying flat in bed with no dyspnea.    Intake/Output Summary (Last 24 hours) at 10/07/13 0617 Last data filed at 10/07/13 0400  Gross per 24 hour  Intake    720 ml  Output   2050 ml  Net  -1330 ml    Lab Results:  Recent Labs  10/05/13 0416 10/06/13 0408  NA 143 138  K 3.6* 3.5*  CL 102 98  CO2 26 27  GLUCOSE 181* 197*  BUN 17 17  CREATININE 1.01 0.89  CALCIUM 8.8 8.7   No results found for this basename: AST, ALT, ALKPHOS, BILITOT, PROT, ALBUMIN,  in the last 72 hours No results found for this basename: LIPASE, AMYLASE,  in the last 72 hours No results found for this basename: WBC, NEUTROABS, HGB, HCT, MCV, PLT,  in the last 72 hours No results found for this basename: CKTOTAL, CKMB, CKMBINDEX, TROPONINI,  in the last 72 hours BNP No results found for this basename: probnp   No results found for this basename: DDIMER,  in the last 72 hours No results found for this basename: HGBA1C,  in the last 72 hours No results found for this  basename: CHOL, HDL, LDLCALC, TRIG, CHOLHDL, LDLDIRECT,  in the last 72 hours No results found for this basename: TSH, T4TOTAL, FREET3, T3FREE, THYROIDAB,  in the last 72 hours No results found for this basename: VITAMINB12, FOLATE, FERRITIN, TIBC, IRON, RETICCTPCT,  in the last 72 hours  Studies/Results: No results found. Medications: Medications administered in the last 24 hours reviewed.  Current Medication List reviewed.    LOS: 3 days   [facility] @ [facility] [phone]) 10/07/2013, 6:17 AM

## 2013-10-10 ENCOUNTER — Non-Acute Institutional Stay (SKILLED_NURSING_FACILITY): Payer: Medicare Other | Admitting: Internal Medicine

## 2013-10-10 DIAGNOSIS — I5022 Chronic systolic (congestive) heart failure: Secondary | ICD-10-CM

## 2013-10-10 DIAGNOSIS — S98119A Complete traumatic amputation of unspecified great toe, initial encounter: Secondary | ICD-10-CM

## 2013-10-10 DIAGNOSIS — E785 Hyperlipidemia, unspecified: Secondary | ICD-10-CM

## 2013-10-10 DIAGNOSIS — E119 Type 2 diabetes mellitus without complications: Secondary | ICD-10-CM

## 2013-10-10 DIAGNOSIS — I10 Essential (primary) hypertension: Secondary | ICD-10-CM

## 2013-10-10 DIAGNOSIS — I428 Other cardiomyopathies: Secondary | ICD-10-CM

## 2013-10-10 DIAGNOSIS — S98139A Complete traumatic amputation of one unspecified lesser toe, initial encounter: Secondary | ICD-10-CM

## 2013-10-11 ENCOUNTER — Encounter: Payer: Self-pay | Admitting: Internal Medicine

## 2013-10-11 DIAGNOSIS — S98139A Complete traumatic amputation of one unspecified lesser toe, initial encounter: Secondary | ICD-10-CM | POA: Insufficient documentation

## 2013-10-11 DIAGNOSIS — S98119A Complete traumatic amputation of unspecified great toe, initial encounter: Secondary | ICD-10-CM | POA: Insufficient documentation

## 2013-10-11 NOTE — Assessment & Plan Note (Signed)
Control adequate, not good today;wonder if he has been on other meds; will start with  increase to Coreg 25 mg BID

## 2013-10-11 NOTE — Assessment & Plan Note (Signed)
Secondary to osteomyelitis on 2/10; ASA and plavix for DVT prophylaxis

## 2013-10-11 NOTE — Assessment & Plan Note (Signed)
Continue glucophage and long and short acting insulin; no recent A1c

## 2013-10-11 NOTE — Assessment & Plan Note (Signed)
Continue lipitor; no recent FLP

## 2013-10-11 NOTE — Assessment & Plan Note (Signed)
Continue Bblocker , ASA, statin, Lasix and aldactone

## 2013-10-11 NOTE — Assessment & Plan Note (Signed)
Secondary to osteo 2/10; ASA and plavix for DVT prophylaxis

## 2013-10-11 NOTE — Assessment & Plan Note (Signed)
Continue B Blocker, ASA, statin, lasix, aldactone

## 2013-10-11 NOTE — Progress Notes (Signed)
MRN: [id number] Name: [name] [name]  Sex: male Age: 68 y.o. DOB: 10/11/1945  [place] [facility] Facility/Room:307A Level Of Care: SNF Provider: Inocencio Homes D Emergency Contacts: Extended Emergency Contact Information Primary Emergency Contact: Palermo,Shirley Address: [address]          [place], [place] [address] Johnnette Litter of [place] Phone: [phone] Mobile Phone: [phone] Relation: Spouse  Code Status: FULL  Allergies: Review of patient's allergies indicates no known allergies.  Chief Complaint  Patient presents with  . nursing home admission    HPI: Patient is 68 y.o. male who is admitted or OT/PT, s/p amputation of L great and second toes.  Past Medical History  Diagnosis Date  . GERD (gastroesophageal reflux disease)   . Obesity   . Cardiomyopathy, nonischemic     a. Cath 2003: mild nonobstructive CAD, EF 25% at that time.  . Hypertension   . Chronic systolic CHF (congestive heart failure)     a. NICM EF 25% dating back to at least 2003.  [name] PAF (paroxysmal atrial fibrillation)     a. Noted on ICD interrogation 2012;  b. coumadin d/c'd 01/2013.  [name] NSVT (nonsustained ventricular tachycardia)     a. Noted on ICD interrogation in 2011.  [name] LBBB (left bundle branch block)   . Kidney stone   . Critical lower limb ischemia   . High cholesterol   . PAD (peripheral artery disease)     a. 08/2013 Periph Angio/PTA: Abd Ao nl, RLE- 3v runoff, PT diff dzs, AT 90p, LLE 2v runoff, PT 100, AT 56m (diamondback ORA/chocolate balloon PTA).  . Type II diabetes mellitus   . Uncontrolled pain, Lt toe 09/21/2013  . Gangrenous toe, Lt toe 09/21/2013  . CAD (coronary artery disease)     a. Nonobstructive by cath 09/2001.  [name] Automatic implantable cardioverter-defibrillator in situ     a. 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  . Dementia     Past Surgical History  Procedure Laterality Date  . Lithotripsy  2001  . Cervical spine surgery   1994  . Cardiac defibrillator placement  06/2009    WITH GENERATOR REPLACED; BiV ICD  . US echocardiography  03/21/2008    EF 30-35%  . Cardiovascular stress test  03/20/2009    EF 33%  . Transluminal atherectomy tibial artery Left 09/12/2013  . Cardiac catheterization  10/01/2001    THERE WAS GLOBAL HYPOKINESIS AND EF 25%. THERE APPEARED TO BE GLOBAL DECREASE IN WALL MOTION  . Toe amputation  10/04/2013    LEFT GREAT TOE AND 4TH TOE   /   DR Erlinda Hong  . Amputation Left 10/04/2013    Procedure: LEFT GREAT TOE AND SECOND TOE AMPUTATION;  Surgeon: Marianna Payment, MD;  Location: [facility];  Service: Orthopedics;  Laterality: Left;      Medication List       This list is accurate as of: 10/10/13 11:59 PM.  Always use your most recent med list.               aspirin EC 81 MG tablet  Take 81 mg by mouth daily.     atorvastatin 10 MG tablet  Commonly known as:  LIPITOR  Take 10 mg by mouth daily.     carvedilol 12.5 MG tablet  Commonly known as:  COREG  Take 12.5 mg by mouth 2 (two) times daily with a meal.     clopidogrel 75 MG tablet  Commonly known as:  PLAVIX  Take 1 tablet (75 mg total) by mouth daily with breakfast.     doxycycline 100 MG tablet  Commonly known as:  VIBRA-TABS  Take 100 mg by mouth 2 (two) times daily. Toe wound. Started on 09/02/13     esomeprazole 40 MG capsule  Commonly known as:  NEXIUM  Take 40 mg by mouth daily as needed (acid reflux).     furosemide 40 MG tablet  Commonly known as:  LASIX  Take 1 tablet (40 mg total) by mouth 2 (two) times daily.     HYDROcodone-acetaminophen 5-325 MG per tablet  Commonly known as:  NORCO  Take 1-2 tablets by mouth every 6 (six) hours as needed.     insulin detemir 100 UNIT/ML injection  Commonly known as:  LEVEMIR  Inject 45 Units into the skin at bedtime.     metFORMIN 500 MG tablet  Commonly known as:  GLUCOPHAGE  Take 1 tablet (500 mg total) by mouth 2 (two) times daily.     MULTIVITAMIN PO  Take 1 tablet  by mouth daily.     NOVOLOG 100 UNIT/ML injection  Generic drug:  insulin aspart  Inject 4 Units into the skin as needed for high blood sugar.     silver sulfADIAZINE 1 % cream  Commonly known as:  SILVADENE  Apply 1 application topically daily.     spironolactone 25 MG tablet  Commonly known as:  ALDACTONE  Take 0.5 tablets (12.5 mg total) by mouth daily.     vitamin E 1000 UNIT capsule  Generic drug:  vitamin E  Take 1,000 Units by mouth daily.        No orders of the defined types were placed in this encounter.    Immunization History  Administered Date(s) Administered  . Influenza-Unspecified 04/25/2013  . Pneumococcal-Unspecified 04/25/2012    History  Substance Use Topics  . Smoking status: Never Smoker   . Smokeless tobacco: Never Used  . Alcohol Use: No    Family history is noncontributory    Review of Systems  DATA OBTAINED: from patient GENERAL: Feels well no fevers, fatigue, appetite changes SKIN: No itching, rash  EYES: No eye pain, redness, discharge EARS: No earache, tinnitus, change in hearing NOSE: No congestion, drainage or bleeding  MOUTH/THROAT: No mouth or tooth pain, No sore throat, No difficulty chewing or swallowing  RESPIRATORY: No cough, wheezing, SOB CARDIAC: No chest pain, palpitations, lower extremity edema  GI: No abdominal pain, No N/V/D or constipation, No heartburn or reflux  GU: No dysuria, frequency or urgency, or incontinence  MUSCULOSKELETAL: No unrelieved bone/joint pain NEUROLOGIC: No headache, dizziness or focal weakness PSYCHIATRIC: No overt anxiety or sadness. Sleeps well. No behavior issue.   Filed Vitals:   10/11/13 1253  BP: 168/64  Pulse: 72  Temp: 98.6 F (37 C)  Resp: 20    Physical Exam  GENERAL APPEARANCE: Alert, conversant. Appropriately groomed. No acute distress.  SKIN: No diaphoresis rash,  HEAD: Normocephalic, atraumatic  EYES: Conjunctiva/lids clear. Pupils round, reactive. EOMs intact.   EARS: External exam WNL, canals clear. Hearing grossly normal.  NOSE: No deformity or discharge.  MOUTH/THROAT: Lips w/o lesions.  RESPIRATORY: Breathing is even, unlabored. Lung sounds are clear   CARDIOVASCULAR: Heart RRR no murmurs, rubs or gallops. No peripheral edema.  GASTROINTESTINAL: Abdomen is soft, non-tender, not distended w/ normal bowel sounds. GENITOURINARY: Bladder non tender, not distended  MUSCULOSKELETAL:Bulky dressing L foot NEUROLOGIC: Oriented X3. Cranial nerves 2-12 grossly intact. Moves all  extremities no tremor. PSYCHIATRIC: Mood and affect appropriate to situation, no behavioral issues  Patient Active Problem List   Diagnosis Date Noted  . Lower limb amputation, great toe 10/11/2013  . Lower limb amputation, other toe(s) 10/11/2013  . Chronic osteomyelitis of toe of left foot 10/04/2013  . Foot osteomyelitis, left 10/04/2013  . Uncontrolled pain, Lt toe 09/21/2013  . Gangrenous toe, Lt toe 09/21/2013  . PAD (peripheral artery disease) 09/13/2013  . Diabetes mellitus 09/13/2013  . Hyperlipidemia 09/13/2013  . PAF (paroxysmal atrial fibrillation) 09/13/2013  . Critical lower limb ischemia 08/30/2013  . 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 HTN (hypertension) 04/08/2011  . NICM- EF 20-25% echo 6/14 09/29/2008  . LBBB 09/29/2008  . SYSTOLIC HEART FAILURE, CHRONIC 09/29/2008    CBC    Component Value Date/Time   WBC 7.3 09/30/2013 1050   RBC 4.50 09/30/2013 1050   HGB 11.0* 09/30/2013 1050   HCT 33.4* 09/30/2013 1050   PLT 314 09/30/2013 1050   MCV 74.2* 09/30/2013 1050   LYMPHSABS 0.1* 02/15/2013 0805   MONOABS 0.1 02/15/2013 0805   EOSABS 0.0 02/15/2013 0805   BASOSABS 0.0 02/15/2013 0805    CMP     Component Value Date/Time   NA 140 10/07/2013 0705   K 3.8 10/07/2013 0705   CL 101 10/07/2013 0705   CO2 28 10/07/2013 0705   GLUCOSE 194* 10/07/2013 0705   BUN 15 10/07/2013 0705   CREATININE 0.87 10/07/2013 0705   CREATININE 1.02 09/06/2013 0950    CALCIUM 9.0 10/07/2013 0705   PROT 6.5 02/14/2013 0445   ALBUMIN 3.0* 02/14/2013 0445   AST 59* 02/14/2013 0445   ALT 44 02/14/2013 0445   ALKPHOS 74 02/14/2013 0445   BILITOT 0.3 02/14/2013 0445   GFRNONAA 86* 10/07/2013 0705   GFRAA >90 10/07/2013 0705    Assessment and Plan  Lower limb amputation, great toe Secondary to osteomyelitis on 2/10; ASA and plavix for DVT prophylaxis  Lower limb amputation, other toe(s) Secondary to osteo 2/10; ASA and plavix for DVT prophylaxis  NICM- EF 20-25% echo 6/14 Continue Bblocker , ASA, statin, Lasix and aldactone  SYSTOLIC HEART FAILURE, CHRONIC Continue B Blocker, ASA, statin, lasix, aldactone  HTN (hypertension) Control adequate, not good today;wonder if he has been on other meds; will start with  increase to Coreg 25 mg BID  Hyperlipidemia Continue lipitor; no recent FLP    Hennie Duos, MD

## 2013-10-14 ENCOUNTER — Encounter (HOSPITAL_BASED_OUTPATIENT_CLINIC_OR_DEPARTMENT_OTHER): Payer: Medicare Other | Attending: General Surgery

## 2013-10-14 DIAGNOSIS — S98139A Complete traumatic amputation of one unspecified lesser toe, initial encounter: Secondary | ICD-10-CM | POA: Insufficient documentation

## 2013-10-14 DIAGNOSIS — L97409 Non-pressure chronic ulcer of unspecified heel and midfoot with unspecified severity: Secondary | ICD-10-CM | POA: Insufficient documentation

## 2013-10-14 DIAGNOSIS — L97509 Non-pressure chronic ulcer of other part of unspecified foot with unspecified severity: Secondary | ICD-10-CM | POA: Insufficient documentation

## 2013-10-14 DIAGNOSIS — S98119A Complete traumatic amputation of unspecified great toe, initial encounter: Secondary | ICD-10-CM | POA: Insufficient documentation

## 2013-10-14 DIAGNOSIS — E1169 Type 2 diabetes mellitus with other specified complication: Secondary | ICD-10-CM | POA: Insufficient documentation

## 2013-10-20 ENCOUNTER — Non-Acute Institutional Stay (SKILLED_NURSING_FACILITY): Payer: Medicare Other | Admitting: Internal Medicine

## 2013-10-20 DIAGNOSIS — E119 Type 2 diabetes mellitus without complications: Secondary | ICD-10-CM

## 2013-10-23 ENCOUNTER — Encounter: Payer: Self-pay | Admitting: Internal Medicine

## 2013-10-23 NOTE — Progress Notes (Addendum)
MRN: [id number] Name: [name] [name]  Sex: male Age: 68 y.o. DOB: 05-16-1946  [place] #: [facility] Facility/Room: 307 Level Of Care: SNF Provider: Inocencio Homes D Emergency Contacts: Extended Emergency Contact Information Primary Emergency Contact: Cicalese,Shirley Address: [address]          [place], [place] [address] Johnnette Litter of [place] Phone: [phone] Mobile Phone: [phone] Relation: Spouse   Allergies: Review of patient's allergies indicates no known allergies.  Chief Complaint  Patient presents with  . Medical Managment of Chronic Issues    HPI: Patient is 68 y.o. male who is being seen for concerns over BS.  Past Medical History  Diagnosis Date  . GERD (gastroesophageal reflux disease)   . Obesity   . Cardiomyopathy, nonischemic     a. Cath 2003: mild nonobstructive CAD, EF 25% at that time.  . Hypertension   . Chronic systolic CHF (congestive heart failure)     a. NICM EF 25% dating back to at least 2003.  [name] PAF (paroxysmal atrial fibrillation)     a. Noted on ICD interrogation 2012;  b. coumadin d/c'd 01/2013.  [name] NSVT (nonsustained ventricular tachycardia)     a. Noted on ICD interrogation in 2011.  [name] LBBB (left bundle branch block)   . Kidney stone   . Critical lower limb ischemia   . High cholesterol   . PAD (peripheral artery disease)     a. 08/2013 Periph Angio/PTA: Abd Ao nl, RLE- 3v runoff, PT diff dzs, AT 90p, LLE 2v runoff, PT 100, AT 76m (diamondback ORA/chocolate balloon PTA).  . Type II diabetes mellitus   . Uncontrolled pain, Lt toe 09/21/2013  . Gangrenous toe, Lt toe 09/21/2013  . CAD (coronary artery disease)     a. Nonobstructive by cath 09/2001.  [name] Automatic implantable cardioverter-defibrillator in situ     a. 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  . Dementia     Past Surgical History  Procedure Laterality Date  . Lithotripsy  2001  . Cervical spine surgery  1994  . Cardiac defibrillator  placement  06/2009    WITH GENERATOR REPLACED; BiV ICD  . US echocardiography  03/21/2008    EF 30-35%  . Cardiovascular stress test  03/20/2009    EF 33%  . Transluminal atherectomy tibial artery Left 09/12/2013  . Cardiac catheterization  10/01/2001    THERE WAS GLOBAL HYPOKINESIS AND EF 25%. THERE APPEARED TO BE GLOBAL DECREASE IN WALL MOTION  . Toe amputation  10/04/2013    LEFT GREAT TOE AND 4TH TOE   /   DR Erlinda Hong  . Amputation Left 10/04/2013    Procedure: LEFT GREAT TOE AND SECOND TOE AMPUTATION;  Surgeon: Marianna Payment, MD;  Location: [facility];  Service: Orthopedics;  Laterality: Left;      Medication List       This list is accurate as of: 10/20/13 11:59 PM.  Always use your most recent med list.               aspirin EC 81 MG tablet  Take 81 mg by mouth daily.     atorvastatin 10 MG tablet  Commonly known as:  LIPITOR  Take 10 mg by mouth daily.     carvedilol 12.5 MG tablet  Commonly known as:  COREG  Take 12.5 mg by mouth 2 (two) times daily with a meal.     clopidogrel 75 MG tablet  Commonly known as:  PLAVIX  Take 1  tablet (75 mg total) by mouth daily with breakfast.     doxycycline 100 MG tablet  Commonly known as:  VIBRA-TABS  Take 100 mg by mouth 2 (two) times daily. Toe wound. Started on 09/02/13     esomeprazole 40 MG capsule  Commonly known as:  NEXIUM  Take 40 mg by mouth daily as needed (acid reflux).     furosemide 40 MG tablet  Commonly known as:  LASIX  Take 1 tablet (40 mg total) by mouth 2 (two) times daily.     HYDROcodone-acetaminophen 5-325 MG per tablet  Commonly known as:  NORCO  Take 1-2 tablets by mouth every 6 (six) hours as needed.     insulin detemir 100 UNIT/ML injection  Commonly known as:  LEVEMIR  Inject 45 Units into the skin at bedtime.     metFORMIN 500 MG tablet  Commonly known as:  GLUCOPHAGE  Take 1 tablet (500 mg total) by mouth 2 (two) times daily.     MULTIVITAMIN PO  Take 1 tablet by mouth daily.     NOVOLOG  100 UNIT/ML injection  Generic drug:  insulin aspart  Inject 4 Units into the skin as needed for high blood sugar.     silver sulfADIAZINE 1 % cream  Commonly known as:  SILVADENE  Apply 1 application topically daily.     spironolactone 25 MG tablet  Commonly known as:  ALDACTONE  Take 0.5 tablets (12.5 mg total) by mouth daily.     vitamin E 1000 UNIT capsule  Generic drug:  vitamin E  Take 1,000 Units by mouth daily.        No orders of the defined types were placed in this encounter.    Immunization History  Administered Date(s) Administered  . Influenza-Unspecified 04/25/2013  . Pneumococcal-Unspecified 04/25/2012    History  Substance Use Topics  . Smoking status: Never Smoker   . Smokeless tobacco: Never Used  . Alcohol Use: No    Review of Systems  DATA OBTAINED: from patient; only c/o is concern over some elevation in BS GENERAL: Feels well no fevers, fatigue, appetite changes SKIN: No itching, rash HEENT: No complaint RESPIRATORY: No cough, wheezing, SOB CARDIAC: No chest pain, palpitations, lower extremity edema  GI: No abdominal pain, No N/V/D or constipation, No heartburn or reflux  GU: No dysuria, frequency or urgency, or incontinence  MUSCULOSKELETAL: No unrelieved bone/joint pain NEUROLOGIC: No headache, dizziness or focal weakness PSYCHIATRIC: No overt anxiety or sadness. Sleeps well.   Filed Vitals:   10/23/13 1618  BP: 102/61  Pulse: 60  Temp: 97.5 F (36.4 C)  Resp: 17    Physical Exam  GENERAL APPEARANCE: Alert, conversant. Appropriately groomed. No acute distress  SKIN: No diaphoresis rash HEENT: Unremarkable RESPIRATORY: Breathing is even, unlabored. Lung sounds are clear   CARDIOVASCULAR: Heart RRR no murmurs, rubs or gallops. No peripheral edema  GASTROINTESTINAL: Abdomen is soft, non-tender, not distended w/ normal bowel sounds.  GENITOURINARY: Bladder non tender, not distended  MUSCULOSKELETAL: L great toe and 2nd toe  amputations. NEUROLOGIC: Cranial nerves 2-12 grossly intact. Moves all extremities no tremor. PSYCHIATRIC: Mood and affect appropriate to situation, no behavioral issues  Patient Active Problem List   Diagnosis Date Noted  . Lower limb amputation, great toe 10/11/2013  . Lower limb amputation, other toe(s) 10/11/2013  . Chronic osteomyelitis of toe of left foot 10/04/2013  . Foot osteomyelitis, left 10/04/2013  . Uncontrolled pain, Lt toe 09/21/2013  . Gangrenous toe, Lt toe  09/21/2013  . PAD (peripheral artery disease) 09/13/2013  . Diabetes mellitus 09/13/2013  . Hyperlipidemia 09/13/2013  . PAF (paroxysmal atrial fibrillation) 09/13/2013  . Critical lower limb ischemia 08/30/2013  . 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 HTN (hypertension) 04/08/2011  . NICM- EF 20-25% echo 6/14 09/29/2008  . LBBB 09/29/2008  . SYSTOLIC HEART FAILURE, CHRONIC 09/29/2008    CBC    Component Value Date/Time   WBC 7.3 09/30/2013 1050   RBC 4.50 09/30/2013 1050   HGB 11.0* 09/30/2013 1050   HCT 33.4* 09/30/2013 1050   PLT 314 09/30/2013 1050   MCV 74.2* 09/30/2013 1050   LYMPHSABS 0.1* 02/15/2013 0805   MONOABS 0.1 02/15/2013 0805   EOSABS 0.0 02/15/2013 0805   BASOSABS 0.0 02/15/2013 0805    CMP     Component Value Date/Time   NA 140 10/07/2013 0705   K 3.8 10/07/2013 0705   CL 101 10/07/2013 0705   CO2 28 10/07/2013 0705   GLUCOSE 194* 10/07/2013 0705   BUN 15 10/07/2013 0705   CREATININE 0.87 10/07/2013 0705   CREATININE 1.02 09/06/2013 0950   CALCIUM 9.0 10/07/2013 0705   PROT 6.5 02/14/2013 0445   ALBUMIN 3.0* 02/14/2013 0445   AST 59* 02/14/2013 0445   ALT 44 02/14/2013 0445   ALKPHOS 74 02/14/2013 0445   BILITOT 0.3 02/14/2013 0445   GFRNONAA 86* 10/07/2013 0705   GFRAA >90 10/07/2013 0705    Assessment and Plan  Diabetes mellitus Pt and I discussed his BS. We may need to tighten up some-I will monitor.    Hennie Duos, MD

## 2013-10-23 NOTE — Progress Notes (Signed)
This encounter was created in error - please disregard.

## 2013-10-23 NOTE — Assessment & Plan Note (Signed)
Pt and I discussed his BS. We may need to tighten up some-I will monitor.

## 2013-10-27 ENCOUNTER — Encounter: Payer: Medicare Other | Admitting: *Deleted

## 2013-10-28 ENCOUNTER — Non-Acute Institutional Stay (SKILLED_NURSING_FACILITY): Payer: Medicare Other | Admitting: Nurse Practitioner

## 2013-10-28 ENCOUNTER — Encounter (HOSPITAL_BASED_OUTPATIENT_CLINIC_OR_DEPARTMENT_OTHER): Payer: Medicare Other | Attending: General Surgery

## 2013-10-28 DIAGNOSIS — E1169 Type 2 diabetes mellitus with other specified complication: Secondary | ICD-10-CM | POA: Insufficient documentation

## 2013-10-28 DIAGNOSIS — L97509 Non-pressure chronic ulcer of other part of unspecified foot with unspecified severity: Secondary | ICD-10-CM | POA: Insufficient documentation

## 2013-10-28 DIAGNOSIS — S98139A Complete traumatic amputation of one unspecified lesser toe, initial encounter: Secondary | ICD-10-CM | POA: Insufficient documentation

## 2013-10-28 DIAGNOSIS — M86672 Other chronic osteomyelitis, left ankle and foot: Secondary | ICD-10-CM

## 2013-10-28 DIAGNOSIS — M86679 Other chronic osteomyelitis, unspecified ankle and foot: Secondary | ICD-10-CM

## 2013-10-28 DIAGNOSIS — E119 Type 2 diabetes mellitus without complications: Secondary | ICD-10-CM

## 2013-10-28 DIAGNOSIS — I10 Essential (primary) hypertension: Secondary | ICD-10-CM

## 2013-10-28 DIAGNOSIS — I5022 Chronic systolic (congestive) heart failure: Secondary | ICD-10-CM

## 2013-10-28 DIAGNOSIS — E785 Hyperlipidemia, unspecified: Secondary | ICD-10-CM

## 2013-10-28 NOTE — Progress Notes (Signed)
Patient ID: [name] [name], male   DOB: 1946/03/19, 67 y.o.   MRN: [id number]    Nursing Home Location:  [facility]   Place of Service: SNF (31)  PCP: Pearla Dubonnet, MD  No Known Allergies  Chief Complaint  Patient presents with  . Discharge Note    HPI:  [name] [name] is an 68 y.o. Male with history of  Chronic osteomyelitis of toe of left foot and went to the operating room on 10/04/2013 and underwent LEFT GREAT TOE AND SECOND TOE AMPUTATION on 10/04/2013 and is now at [facility] for ongoing rehab; Patient currently doing well with therapy, now stable to discharge home with home health.  Review of Systems:  Review of Systems  Constitutional: Negative for fever, chills and malaise/fatigue.  Respiratory: Negative for cough and shortness of breath.   Cardiovascular: Negative for chest pain.  Gastrointestinal: Positive for constipation. Negative for heartburn, abdominal pain and diarrhea.  Genitourinary: Negative for dysuria.  Musculoskeletal: Negative for joint pain and myalgias.  Skin: Negative.   Neurological: Negative for dizziness, weakness and headaches.  Psychiatric/Behavioral: Negative for depression.     Past Medical History  Diagnosis Date  . GERD (gastroesophageal reflux disease)   . Obesity   . Cardiomyopathy, nonischemic     a. Cath 2003: mild nonobstructive CAD, EF 25% at that time.  . Hypertension   . Chronic systolic CHF (congestive heart failure)     a. NICM EF 25% dating back to at least 2003.  [name] PAF (paroxysmal atrial fibrillation)     a. Noted on ICD interrogation 2012;  b. coumadin d/c'd 01/2013.  [name] NSVT (nonsustained ventricular tachycardia)     a. Noted on ICD interrogation in 2011.  [name] LBBB (left bundle branch block)   . Kidney stone   . Critical lower limb ischemia   . High cholesterol   . PAD (peripheral artery disease)     a. 08/2013 Periph Angio/PTA: Abd Ao nl, RLE- 3v runoff, PT diff dzs, AT 90p, LLE 2v runoff, PT  100, AT 10m (diamondback ORA/chocolate balloon PTA).  . Type II diabetes mellitus   . Uncontrolled pain, Lt toe 09/21/2013  . Gangrenous toe, Lt toe 09/21/2013  . CAD (coronary artery disease)     a. Nonobstructive by cath 09/2001.  [name] Automatic implantable cardioverter-defibrillator in situ     a. 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  . Dementia    Past Surgical History  Procedure Laterality Date  . Lithotripsy  2001  . Cervical spine surgery  1994  . Cardiac defibrillator placement  06/2009    WITH GENERATOR REPLACED; BiV ICD  . US echocardiography  03/21/2008    EF 30-35%  . Cardiovascular stress test  03/20/2009    EF 33%  . Transluminal atherectomy tibial artery Left 09/12/2013  . Cardiac catheterization  10/01/2001    THERE WAS GLOBAL HYPOKINESIS AND EF 25%. THERE APPEARED TO BE GLOBAL DECREASE IN WALL MOTION  . Toe amputation  10/04/2013    LEFT GREAT TOE AND 4TH TOE   /   DR Roda Shutters  . Amputation Left 10/04/2013    Procedure: LEFT GREAT TOE AND SECOND TOE AMPUTATION;  Surgeon: Cheral Almas, MD;  Location: [facility] OR;  Service: Orthopedics;  Laterality: Left;   Social History:   reports that he has never smoked. He has never used smokeless tobacco. He reports that he does not drink alcohol or use illicit drugs.  Family History  Problem Relation Age of Onset  . Heart disease Mother   . Hypertension Mother   . Diabetes Mother   . Diabetes Father     Medications: Patient's Medications  New Prescriptions   No medications on file  Previous Medications   ASPIRIN EC 81 MG TABLET    Take 81 mg by mouth daily.   ATORVASTATIN (LIPITOR) 10 MG TABLET    Take 10 mg by mouth daily.     CARVEDILOL (COREG) 12.5 MG TABLET    Take 12.5 mg by mouth 2 (two) times daily with a meal.   CLOPIDOGREL (PLAVIX) 75 MG TABLET    Take 1 tablet (75 mg total) by mouth daily with breakfast.   DOXYCYCLINE (VIBRA-TABS) 100 MG TABLET    Take 100 mg by mouth 2 (two) times daily.  Toe wound. Started on 09/02/13   ESOMEPRAZOLE (NEXIUM) 40 MG CAPSULE    Take 40 mg by mouth daily as needed (acid reflux).    FUROSEMIDE (LASIX) 40 MG TABLET    Take 1 tablet (40 mg total) by mouth 2 (two) times daily.   HYDROCODONE-ACETAMINOPHEN (NORCO) 5-325 MG PER TABLET    Take 1-2 tablets by mouth every 6 (six) hours as needed.   INSULIN DETEMIR (LEVEMIR) 100 UNIT/ML INJECTION    Inject 45 Units into the skin at bedtime.   METFORMIN (GLUCOPHAGE) 500 MG TABLET    Take 1 tablet (500 mg total) by mouth 2 (two) times daily.   MULTIPLE VITAMINS-MINERALS (MULTIVITAMIN PO)    Take 1 tablet by mouth daily.   NOVOLOG 100 UNIT/ML INJECTION    Inject 4 Units into the skin as needed for high blood sugar.    SILVER SULFADIAZINE (SILVADENE) 1 % CREAM    Apply 1 application topically daily.    SPIRONOLACTONE (ALDACTONE) 25 MG TABLET    Take 0.5 tablets (12.5 mg total) by mouth daily.   VITAMIN E (VITAMIN E) 1000 UNIT CAPSULE    Take 1,000 Units by mouth daily.  Modified Medications   No medications on file  Discontinued Medications   No medications on file     Physical Exam:  Filed Vitals:   10/28/13 1045  BP: 106/71  Pulse: 80  Temp: 98 F (36.7 C)  Resp: 20   Physical Exam  Constitutional: He is oriented to person, place, and time. He appears well-developed and well-nourished.  HENT:  Head: Normocephalic and atraumatic.  Mouth/Throat: Oropharynx is clear and moist. No oropharyngeal exudate.  Eyes: Conjunctivae and EOM are normal. Pupils are equal, round, and reactive to light.  Neck: Normal range of motion. Neck supple.  Cardiovascular: Normal rate, regular rhythm and normal heart sounds.   Pulmonary/Chest: Effort normal and breath sounds normal. No respiratory distress.  Abdominal: Soft. Bowel sounds are normal.  Musculoskeletal: He exhibits no edema and no tenderness.  Neurological: He is alert and oriented to person, place, and time.  Skin: Skin is warm and dry. No erythema.    Psychiatric: He has a normal mood and affect.      Labs reviewed: Basic Metabolic Panel:  Recent Labs  10/05/13 0416 10/06/13 0408 10/07/13 0705  NA 143 138 140  K 3.6* 3.5* 3.8  CL 102 98 101  CO2 26 27 28   GLUCOSE 181* 197* 194*  BUN 17 17 15   CREATININE 1.01 0.89 0.87  CALCIUM 8.8 8.7 9.0   Liver Function Tests:  Recent Labs  02/12/13 1739 02/13/13 1556 02/14/13 0445  AST 21 31 59*  ALT  25 26 44  ALKPHOS 101 66 74  BILITOT 0.2* 0.2* 0.3  PROT 7.5 6.2 6.5  ALBUMIN 3.7 2.7* 3.0*    Recent Labs  02/13/13 1556  LIPASE 16    Recent Labs  02/13/13 1050  AMMONIA 17   CBC:  Recent Labs  02/12/13 1739  02/15/13 0805  09/06/13 0950 09/13/13 0450 09/30/13 1050  WBC 3.1*  < > 1.7*  < > 6.9 5.8 7.3  NEUTROABS 2.7  --  1.6*  --   --   --   --   HGB 12.9*  < > 11.8*  < > 11.5* 9.9* 11.0*  HCT 38.2*  < > 34.9*  < > 35.1* 30.2* 33.4*  MCV 73.9*  < > 74.1*  < > 74.1* 74.6* 74.2*  PLT 157  < > 59*  < > 308 284 314  < > = values in this interval not displayed. Cardiac Enzymes:  Recent Labs  02/12/13 1739  TROPONINI <0.30   BNP: No components found with this basename: POCBNP,  CBG:  Recent Labs  10/06/13 2150 10/07/13 0646 10/07/13 1115  GLUCAP 209* 174* 232*   TSH:  Recent Labs  02/13/13 0545 09/06/13 0950  TSH 0.582 3.230   A1C: No results found for this basename: HGBA1C     Assessment/Plan 1. SYSTOLIC HEART FAILURE, CHRONIC -stable without signs of or recent exacerbations -conts on aldactone, lasix and coreg, asa and plavix  2. HTN (hypertension) -stable on current medications  3. Diabetes mellitus -conts on metformin,  levemir and novolog with meals -will need ongoing out pt follow up  -encouraged diet modifications  4. Chronic osteomyelitis of toe of left foot -s/p amputation   5. Lower limb amputation, other toe(s) -conts to follow up with orthopedic surgery, will need [facility] nursing for evaluation and dressing changes  in the home -pain is adequately controlled on current medications  -conts doxycyline until 3.16.15  6. Hyperlipidemia -conts on lipitor, no fasting lipids while at [facility], will need to be managed by PCP  7. Constipation  -pt instructed that he can take colace 100 mg daily    pt is stable for discharge-will need PT/OT/Nursing per home health.DME needed is 3:1 bsc. Rx written.  will need to follow up with PCP within 2 weeks.

## 2013-10-31 NOTE — Progress Notes (Signed)
Clinical social worker assisted with patient discharge to skilled nursing facility, [facility].  CSW addressed all family questions and concerns. CSW copied chart and added all important documents. CSW also set up patient transportation with [employer] operational officer. Clinical Social Worker will sign off for now as social work intervention is no longer needed.   Rhea Pink, MSW, [place]

## 2013-11-01 ENCOUNTER — Ambulatory Visit (INDEPENDENT_AMBULATORY_CARE_PROVIDER_SITE_OTHER): Payer: Medicare Other | Admitting: *Deleted

## 2013-11-01 ENCOUNTER — Other Ambulatory Visit: Payer: Self-pay | Admitting: Internal Medicine

## 2013-11-01 ENCOUNTER — Encounter: Payer: Self-pay | Admitting: Internal Medicine

## 2013-11-01 DIAGNOSIS — I428 Other cardiomyopathies: Secondary | ICD-10-CM

## 2013-11-07 ENCOUNTER — Other Ambulatory Visit (HOSPITAL_COMMUNITY): Payer: Self-pay | Admitting: Orthopaedic Surgery

## 2013-11-07 NOTE — Pre-Procedure Instructions (Signed)
Tea  11/07/2013   Your procedure is scheduled on:  Wednesday November 09, 2013 at 1:30 PM.  Report to [facility] Short Stay Entrance "A"  Admitting at 11:30 AM.  Call this number if you have problems the morning of surgery: [phone]   Remember:   Do not eat food or drink liquids after midnight.   Take these medicines the morning of surgery with A SIP OF WATER: Carvedilol (Coreg), Esomeprazole (Nexium), Hydrocodone if needed for pain    Do NOT take any diabetic medications the morning of your surgery including insulin   Stop Aspirin and Plavix today as instructed by Dr Marianna Payment  Do not wear jewelry.  Do not wear lotions, powders, or colognes. You may wear deodorant.  Men may shave face and neck.  Do not bring valuables to the hospital.  [facility] is not responsible for any belongings or valuables.               Contacts, dentures or bridgework may not be worn into surgery.  Leave suitcase in the car. After surgery it may be brought to your room.  For patients admitted to the hospital, discharge time is determined by your treatment team.               Patients discharged the day of surgery will not be allowed to drive home.  Name and phone number of your driver: Family/Friend  Special Instructions: Shower the night before and the morning of your surgery using CHG soap   Please read over the following fact sheets that you were given: Pain Booklet, Coughing and Deep Breathing and Surgical Site Infection Prevention

## 2013-11-08 ENCOUNTER — Encounter (HOSPITAL_COMMUNITY): Payer: Self-pay

## 2013-11-08 ENCOUNTER — Encounter (HOSPITAL_COMMUNITY)
Admission: RE | Admit: 2013-11-08 | Discharge: 2013-11-08 | Disposition: A | Discharge status: Home / Self Care | Payer: Medicare Other | Source: Ambulatory Visit | Attending: Orthopaedic Surgery | Admitting: Orthopaedic Surgery

## 2013-11-08 HISTORY — DX: Unspecified osteoarthritis, unspecified site: M19.90

## 2013-11-08 LAB — CBC
HEMATOCRIT: 30 % — AB (ref 39.0–52.0)
Hemoglobin: 9.9 g/dL — ABNORMAL LOW (ref 13.0–17.0)
MCH: 24.1 pg — ABNORMAL LOW (ref 26.0–34.0)
MCHC: 33 g/dL (ref 30.0–36.0)
MCV: 73.2 fL — AB (ref 78.0–100.0)
PLATELETS: 260 10*3/uL (ref 150–400)
RBC: 4.1 MIL/uL — ABNORMAL LOW (ref 4.22–5.81)
RDW: 17 % — ABNORMAL HIGH (ref 11.5–15.5)
WBC: 6.9 10*3/uL (ref 4.0–10.5)

## 2013-11-08 LAB — BASIC METABOLIC PANEL
BUN: 26 mg/dL — ABNORMAL HIGH (ref 6–23)
CHLORIDE: 102 meq/L (ref 96–112)
CO2: 25 meq/L (ref 19–32)
Calcium: 9.4 mg/dL (ref 8.4–10.5)
Creatinine, Ser: 0.93 mg/dL (ref 0.50–1.35)
GFR calc Af Amer: >90 mL/min (ref >90)
GFR calc non Af Amer: 83 mL/min — ABNORMAL LOW (ref >90)
Glucose, Bld: 150 mg/dL — ABNORMAL HIGH (ref 70–99)
Potassium: 3.7 mEq/L (ref 3.7–5.3)
SODIUM: 143 meq/L (ref 137–147)

## 2013-11-08 LAB — PROTIME-INR
INR: 0.95 (ref 0.00–1.49)
Prothrombin Time: 12.5 seconds (ref 11.6–15.2)

## 2013-11-08 MED ORDER — CEFAZOLIN SODIUM-DEXTROSE 2-3 GM-% IV SOLR
2.0000 g | INTRAVENOUS | Status: DC
  Filled 2013-11-08: qty 50

## 2013-11-08 NOTE — Progress Notes (Signed)
Anesthesia Chart Review:  Patient is a 68 year old male scheduled for left BKA on 11/09/13 by Dr. Erlinda Hong.    History includes PAD s/p left PTA atherectomy 08/2013 (Dr. Quay Burow), DM2, chronic osteomyelitis left foot s/p left 1st and 2nd toe amputation on 10/04/13, non-ischemic cardiomyopathy (diagnosed 09/2001 cath, EF 25%, 30-40% pLAD, 30% LCX, minimal RCA), chronic systolic CHF, BiV-ICD '05 with generator change 06/2009 [facility]), PAF noted on ICD interrogation '12 and NSVT in '11, left BBB, GERD, HTN, arthritis, nephrolithiasis, dementia, non-smoker. PCP is Dr. Ria Bush. Cardiologist is Dr. Caryl Comes, last visit 07/26/13. He had cleared patient and for toe amputation last month with permission to hold "Xarelto"--although it was later clarified that it was Plavix.  Wife told the PAT RN that Dr. Erlinda Hong has already instructed patient to hold Plavix and ASA.    Echo on 02/15/13 showed: - Left ventricle: The cavity size was mildly dilated. Wall thickness was normal. Systolic function was severely reduced. The estimated ejection fraction was in the range of 20% to 25%. Diffuse hypokinesis. Doppler parameters are consistent with abnormal left ventricular relaxation (grade 1 diastolic dysfunction). - Mitral valve: Mild regurgitation. - Left atrium: The atrium was mildly dilated.  Nuclear stress test on 03/20/09 showed reduced LV systolic function with EF 33% with global hypokinesis and no evidence of ischemia or scar.  EKG on 07/26/13 noted.  It showed SR.  It looks to be v-paced.   CXR on 02/12/13 showed: Post AICD. Bronchitic changes and hyperinflation without acute infiltrate.   Preoperative labs noted.  H/H 9.9/30.0.  Defer decision for T&S to Dr. Erlinda Hong and/or his assigned anesthesiologist.    Reviewed above with anesthesiologist Dr. Linna Caprice.  He agrees that if patient has no acute CV/CHF symptoms then would plan to proceed.  Further evaluation by his assigned anesthesiologist on the day of  surgery.  [facility] Short Stay Center/Anesthesiology Phone [phone] 11/08/2013 1:44 PM

## 2013-11-09 ENCOUNTER — Encounter (HOSPITAL_COMMUNITY)
Admission: RE | Disposition: A | Discharge status: Rehabilitation Facility | Payer: Self-pay | Source: Ambulatory Visit | Attending: Orthopaedic Surgery

## 2013-11-09 ENCOUNTER — Inpatient Hospital Stay (HOSPITAL_COMMUNITY)
Admission: RE | Admit: 2013-11-09 | Discharge: 2013-11-14 | DRG: 240 | Disposition: A | Discharge status: Rehabilitation Facility | Payer: Medicare Other | Source: Ambulatory Visit | Attending: Orthopaedic Surgery | Admitting: Orthopaedic Surgery

## 2013-11-09 ENCOUNTER — Encounter (HOSPITAL_COMMUNITY): Payer: Medicare Other | Admitting: Vascular Surgery

## 2013-11-09 ENCOUNTER — Encounter (HOSPITAL_COMMUNITY): Payer: Self-pay | Admitting: *Deleted

## 2013-11-09 ENCOUNTER — Inpatient Hospital Stay (HOSPITAL_COMMUNITY): Payer: Medicare Other | Admitting: Anesthesiology

## 2013-11-09 DIAGNOSIS — E785 Hyperlipidemia, unspecified: Secondary | ICD-10-CM | POA: Diagnosis present

## 2013-11-09 DIAGNOSIS — M869 Osteomyelitis, unspecified: Secondary | ICD-10-CM | POA: Diagnosis present

## 2013-11-09 DIAGNOSIS — I472 Ventricular tachycardia, unspecified: Secondary | ICD-10-CM | POA: Diagnosis present

## 2013-11-09 DIAGNOSIS — L97909 Non-pressure chronic ulcer of unspecified part of unspecified lower leg with unspecified severity: Secondary | ICD-10-CM | POA: Diagnosis present

## 2013-11-09 DIAGNOSIS — D62 Acute posthemorrhagic anemia: Secondary | ICD-10-CM

## 2013-11-09 DIAGNOSIS — L97809 Non-pressure chronic ulcer of other part of unspecified lower leg with unspecified severity: Secondary | ICD-10-CM | POA: Diagnosis present

## 2013-11-09 DIAGNOSIS — Z8249 Family history of ischemic heart disease and other diseases of the circulatory system: Secondary | ICD-10-CM

## 2013-11-09 DIAGNOSIS — M9080 Osteopathy in diseases classified elsewhere, unspecified site: Secondary | ICD-10-CM | POA: Diagnosis present

## 2013-11-09 DIAGNOSIS — E1159 Type 2 diabetes mellitus with other circulatory complications: Principal | ICD-10-CM | POA: Diagnosis present

## 2013-11-09 DIAGNOSIS — Z794 Long term (current) use of insulin: Secondary | ICD-10-CM

## 2013-11-09 DIAGNOSIS — Z95810 Presence of automatic (implantable) cardiac defibrillator: Secondary | ICD-10-CM

## 2013-11-09 DIAGNOSIS — E1169 Type 2 diabetes mellitus with other specified complication: Secondary | ICD-10-CM | POA: Diagnosis present

## 2013-11-09 DIAGNOSIS — M86679 Other chronic osteomyelitis, unspecified ankle and foot: Secondary | ICD-10-CM | POA: Diagnosis present

## 2013-11-09 DIAGNOSIS — Z950 Presence of cardiac pacemaker: Secondary | ICD-10-CM

## 2013-11-09 DIAGNOSIS — I2510 Atherosclerotic heart disease of native coronary artery without angina pectoris: Secondary | ICD-10-CM | POA: Diagnosis present

## 2013-11-09 DIAGNOSIS — I5022 Chronic systolic (congestive) heart failure: Secondary | ICD-10-CM | POA: Diagnosis present

## 2013-11-09 DIAGNOSIS — E118 Type 2 diabetes mellitus with unspecified complications: Secondary | ICD-10-CM | POA: Diagnosis present

## 2013-11-09 DIAGNOSIS — I480 Paroxysmal atrial fibrillation: Secondary | ICD-10-CM | POA: Diagnosis present

## 2013-11-09 DIAGNOSIS — I10 Essential (primary) hypertension: Secondary | ICD-10-CM | POA: Diagnosis present

## 2013-11-09 DIAGNOSIS — I447 Left bundle-branch block, unspecified: Secondary | ICD-10-CM | POA: Diagnosis present

## 2013-11-09 DIAGNOSIS — I4729 Other ventricular tachycardia: Secondary | ICD-10-CM | POA: Diagnosis present

## 2013-11-09 DIAGNOSIS — I96 Gangrene, not elsewhere classified: Secondary | ICD-10-CM | POA: Diagnosis present

## 2013-11-09 DIAGNOSIS — M129 Arthropathy, unspecified: Secondary | ICD-10-CM | POA: Diagnosis present

## 2013-11-09 DIAGNOSIS — I428 Other cardiomyopathies: Secondary | ICD-10-CM | POA: Diagnosis present

## 2013-11-09 DIAGNOSIS — Z79899 Other long term (current) drug therapy: Secondary | ICD-10-CM

## 2013-11-09 DIAGNOSIS — I5042 Chronic combined systolic (congestive) and diastolic (congestive) heart failure: Secondary | ICD-10-CM | POA: Diagnosis present

## 2013-11-09 DIAGNOSIS — Z833 Family history of diabetes mellitus: Secondary | ICD-10-CM

## 2013-11-09 DIAGNOSIS — L97509 Non-pressure chronic ulcer of other part of unspecified foot with unspecified severity: Secondary | ICD-10-CM | POA: Diagnosis present

## 2013-11-09 DIAGNOSIS — E119 Type 2 diabetes mellitus without complications: Secondary | ICD-10-CM

## 2013-11-09 DIAGNOSIS — I739 Peripheral vascular disease, unspecified: Secondary | ICD-10-CM

## 2013-11-09 DIAGNOSIS — L98499 Non-pressure chronic ulcer of skin of other sites with unspecified severity: Secondary | ICD-10-CM | POA: Diagnosis present

## 2013-11-09 DIAGNOSIS — K219 Gastro-esophageal reflux disease without esophagitis: Secondary | ICD-10-CM | POA: Diagnosis present

## 2013-11-09 DIAGNOSIS — F0390 Unspecified dementia without behavioral disturbance: Secondary | ICD-10-CM | POA: Diagnosis present

## 2013-11-09 DIAGNOSIS — E1142 Type 2 diabetes mellitus with diabetic polyneuropathy: Secondary | ICD-10-CM | POA: Diagnosis present

## 2013-11-09 DIAGNOSIS — E1149 Type 2 diabetes mellitus with other diabetic neurological complication: Secondary | ICD-10-CM | POA: Diagnosis present

## 2013-11-09 DIAGNOSIS — Z7902 Long term (current) use of antithrombotics/antiplatelets: Secondary | ICD-10-CM

## 2013-11-09 DIAGNOSIS — R112 Nausea with vomiting, unspecified: Secondary | ICD-10-CM | POA: Diagnosis present

## 2013-11-09 DIAGNOSIS — I509 Heart failure, unspecified: Secondary | ICD-10-CM | POA: Diagnosis present

## 2013-11-09 DIAGNOSIS — M86672 Other chronic osteomyelitis, left ankle and foot: Secondary | ICD-10-CM

## 2013-11-09 DIAGNOSIS — I4891 Unspecified atrial fibrillation: Secondary | ICD-10-CM | POA: Diagnosis present

## 2013-11-09 DIAGNOSIS — E7800 Pure hypercholesterolemia, unspecified: Secondary | ICD-10-CM | POA: Diagnosis present

## 2013-11-09 DIAGNOSIS — E669 Obesity, unspecified: Secondary | ICD-10-CM | POA: Diagnosis present

## 2013-11-09 DIAGNOSIS — Z7982 Long term (current) use of aspirin: Secondary | ICD-10-CM

## 2013-11-09 HISTORY — PX: LEG AMPUTATION BELOW KNEE: SHX694

## 2013-11-09 HISTORY — PX: AMPUTATION: SHX166

## 2013-11-09 LAB — APTT: aPTT: 29 seconds (ref 24–37)

## 2013-11-09 LAB — GLUCOSE, CAPILLARY
Glucose-Capillary: 114 mg/dL — ABNORMAL HIGH (ref 70–99)
Glucose-Capillary: 116 mg/dL — ABNORMAL HIGH (ref 70–99)
Glucose-Capillary: 183 mg/dL — ABNORMAL HIGH (ref 70–99)

## 2013-11-09 SURGERY — AMPUTATION BELOW KNEE
Anesthesia: General | Site: Leg Lower | Laterality: Left

## 2013-11-09 MED ORDER — SODIUM CHLORIDE 0.9 % IV SOLN
INTRAVENOUS | Status: DC | PRN
  Administered 2013-11-09 (×2): 1000 mL via INTRAMUSCULAR

## 2013-11-09 MED ORDER — METHOCARBAMOL 100 MG/ML IJ SOLN
500.0000 mg | Freq: Four times a day (QID) | INTRAVENOUS | Status: DC | PRN
  Filled 2013-11-09: qty 5

## 2013-11-09 MED ORDER — INSULIN ASPART 100 UNIT/ML ~~LOC~~ SOLN: 0.0000 [IU] | Freq: Three times a day (TID) | SUBCUTANEOUS | Status: DC

## 2013-11-09 MED ORDER — FENTANYL CITRATE 0.05 MG/ML IJ SOLN
INTRAMUSCULAR | Status: AC
  Filled 2013-11-09: qty 5

## 2013-11-09 MED ORDER — CLOPIDOGREL BISULFATE 75 MG PO TABS
75.0000 mg | ORAL_TABLET | Freq: Every day | ORAL | Status: DC
  Administered 2013-11-10 – 2013-11-14 (×5): 75 mg via ORAL
  Filled 2013-11-09 (×6): qty 1

## 2013-11-09 MED ORDER — CARVEDILOL 12.5 MG PO TABS
ORAL_TABLET | ORAL | Status: AC
  Administered 2013-11-09: 13:00:00
  Filled 2013-11-09: qty 1

## 2013-11-09 MED ORDER — LACTATED RINGERS IV SOLN
INTRAVENOUS | Status: DC | PRN
  Administered 2013-11-09 (×2): via INTRAVENOUS

## 2013-11-09 MED ORDER — SILVER SULFADIAZINE 1 % EX CREA
1.0000 "application " | TOPICAL_CREAM | Freq: Two times a day (BID) | CUTANEOUS | Status: DC
  Administered 2013-11-10 – 2013-11-14 (×7): 1 via TOPICAL
  Filled 2013-11-09 (×2): qty 85

## 2013-11-09 MED ORDER — POLYETHYLENE GLYCOL 3350 17 G PO PACK: 17.0000 g | PACK | Freq: Every day | ORAL | Status: DC | PRN

## 2013-11-09 MED ORDER — SPIRONOLACTONE 12.5 MG HALF TABLET
12.5000 mg | ORAL_TABLET | Freq: Every day | ORAL | Status: DC
  Administered 2013-11-10: 12.5 mg via ORAL
  Filled 2013-11-09 (×2): qty 1

## 2013-11-09 MED ORDER — MIDAZOLAM HCL 2 MG/2ML IJ SOLN
INTRAMUSCULAR | Status: AC
  Filled 2013-11-09: qty 2

## 2013-11-09 MED ORDER — FUROSEMIDE 40 MG PO TABS
40.0000 mg | ORAL_TABLET | Freq: Two times a day (BID) | ORAL | Status: DC
  Filled 2013-11-09 (×3): qty 1

## 2013-11-09 MED ORDER — CARVEDILOL 12.5 MG PO TABS
12.5000 mg | ORAL_TABLET | Freq: Two times a day (BID) | ORAL | Status: DC
  Administered 2013-11-10 – 2013-11-14 (×9): 12.5 mg via ORAL
  Filled 2013-11-09 (×11): qty 1

## 2013-11-09 MED ORDER — MAGNESIUM CITRATE PO SOLN: 1.0000 | Freq: Once | ORAL | Status: AC | PRN

## 2013-11-09 MED ORDER — PANTOPRAZOLE SODIUM 40 MG PO TBEC
80.0000 mg | DELAYED_RELEASE_TABLET | Freq: Every day | ORAL | Status: DC
  Administered 2013-11-10 – 2013-11-14 (×5): 80 mg via ORAL
  Filled 2013-11-09 (×4): qty 2

## 2013-11-09 MED ORDER — HYDROMORPHONE HCL PF 1 MG/ML IJ SOLN
INTRAMUSCULAR | Status: AC
Start: 2013-11-09 — End: 2013-11-10
  Filled 2013-11-09: qty 1

## 2013-11-09 MED ORDER — METOCLOPRAMIDE HCL 5 MG PO TABS
5.0000 mg | ORAL_TABLET | Freq: Three times a day (TID) | ORAL | Status: DC | PRN
  Filled 2013-11-09: qty 2

## 2013-11-09 MED ORDER — HYDROMORPHONE HCL PF 1 MG/ML IJ SOLN
0.2500 mg | INTRAMUSCULAR | Status: DC | PRN
Start: 2013-11-09 — End: 2013-11-09
  Administered 2013-11-09: 0.5 mg via INTRAVENOUS

## 2013-11-09 MED ORDER — ASPIRIN EC 81 MG PO TBEC
81.0000 mg | DELAYED_RELEASE_TABLET | Freq: Every day | ORAL | Status: DC
  Filled 2013-11-09 (×2): qty 1

## 2013-11-09 MED ORDER — ATORVASTATIN CALCIUM 10 MG PO TABS
10.0000 mg | ORAL_TABLET | Freq: Every day | ORAL | Status: DC
  Administered 2013-11-10 – 2013-11-13 (×4): 10 mg via ORAL
  Filled 2013-11-09 (×6): qty 1

## 2013-11-09 MED ORDER — ONDANSETRON HCL 4 MG PO TABS
4.0000 mg | ORAL_TABLET | Freq: Four times a day (QID) | ORAL | Status: DC | PRN
  Administered 2013-11-12: 4 mg via ORAL
  Filled 2013-11-09: qty 1

## 2013-11-09 MED ORDER — INSULIN DETEMIR 100 UNIT/ML ~~LOC~~ SOLN
10.0000 [IU] | Freq: Every day | SUBCUTANEOUS | Status: DC
  Filled 2013-11-09: qty 0.1

## 2013-11-09 MED ORDER — OXYCODONE HCL 5 MG PO TABS: 5.0000 mg | ORAL_TABLET | Freq: Once | ORAL | Status: DC | PRN

## 2013-11-09 MED ORDER — FENTANYL CITRATE 0.05 MG/ML IJ SOLN
INTRAMUSCULAR | Status: DC | PRN
Start: 2013-11-09 — End: 2013-11-09
  Administered 2013-11-09 (×3): 25 ug via INTRAVENOUS
  Administered 2013-11-09: 50 ug via INTRAVENOUS

## 2013-11-09 MED ORDER — HYDROCODONE-ACETAMINOPHEN 5-325 MG PO TABS: 1.0000 | ORAL_TABLET | ORAL | Status: DC | PRN

## 2013-11-09 MED ORDER — CEFAZOLIN SODIUM-DEXTROSE 2-3 GM-% IV SOLR
2.0000 g | Freq: Four times a day (QID) | INTRAVENOUS | Status: AC
  Administered 2013-11-09 – 2013-11-10 (×3): 2 g via INTRAVENOUS
  Filled 2013-11-09 (×3): qty 50

## 2013-11-09 MED ORDER — DIPHENHYDRAMINE HCL 12.5 MG/5ML PO ELIX: 25.0000 mg | ORAL_SOLUTION | ORAL | Status: DC | PRN

## 2013-11-09 MED ORDER — INSULIN DETEMIR 100 UNIT/ML ~~LOC~~ SOLN
22.0000 [IU] | Freq: Every day | SUBCUTANEOUS | Status: DC
  Filled 2013-11-09: qty 0.22

## 2013-11-09 MED ORDER — METOCLOPRAMIDE HCL 5 MG/ML IJ SOLN
5.0000 mg | Freq: Three times a day (TID) | INTRAMUSCULAR | Status: DC | PRN
  Administered 2013-11-09: 10 mg via INTRAVENOUS
  Filled 2013-11-09: qty 2

## 2013-11-09 MED ORDER — PROPOFOL 10 MG/ML IV BOLUS
INTRAVENOUS | Status: DC | PRN
  Administered 2013-11-09: 200 mg via INTRAVENOUS

## 2013-11-09 MED ORDER — SODIUM CHLORIDE 0.9 % IV SOLN
INTRAVENOUS | Status: DC
  Administered 2013-11-10: 05:00:00 via INTRAVENOUS

## 2013-11-09 MED ORDER — OXYCODONE HCL 5 MG PO TABS
5.0000 mg | ORAL_TABLET | ORAL | Status: DC | PRN
  Administered 2013-11-09: 15 mg via ORAL
  Administered 2013-11-10: 10 mg via ORAL
  Administered 2013-11-10 (×3): 15 mg via ORAL
  Administered 2013-11-11 – 2013-11-12 (×3): 10 mg via ORAL
  Filled 2013-11-09 (×2): qty 3
  Filled 2013-11-09: qty 2
  Filled 2013-11-09 (×2): qty 3
  Filled 2013-11-09 (×4): qty 2
  Filled 2013-11-09: qty 3

## 2013-11-09 MED ORDER — SENNA 8.6 MG PO TABS
1.0000 | ORAL_TABLET | Freq: Two times a day (BID) | ORAL | Status: DC
  Administered 2013-11-10 – 2013-11-14 (×9): 8.6 mg via ORAL
  Filled 2013-11-09 (×12): qty 1

## 2013-11-09 MED ORDER — PROMETHAZINE HCL 25 MG/ML IJ SOLN: 6.2500 mg | INTRAMUSCULAR | Status: DC | PRN

## 2013-11-09 MED ORDER — INSULIN ASPART 100 UNIT/ML ~~LOC~~ SOLN: 0.0000 [IU] | Freq: Every day | SUBCUTANEOUS | Status: DC

## 2013-11-09 MED ORDER — SORBITOL 70 % SOLN: 30.0000 mL | Freq: Every day | Status: DC | PRN

## 2013-11-09 MED ORDER — LIDOCAINE HCL (CARDIAC) 20 MG/ML IV SOLN
INTRAVENOUS | Status: DC | PRN
  Administered 2013-11-09: 70 mg via INTRAVENOUS

## 2013-11-09 MED ORDER — OXYCODONE HCL 5 MG/5ML PO SOLN: 5.0000 mg | Freq: Once | ORAL | Status: DC | PRN

## 2013-11-09 MED ORDER — MORPHINE SULFATE 2 MG/ML IJ SOLN
1.0000 mg | INTRAMUSCULAR | Status: DC | PRN
  Administered 2013-11-09: 1 mg via INTRAVENOUS
  Filled 2013-11-09: qty 1

## 2013-11-09 MED ORDER — ONDANSETRON HCL 4 MG/2ML IJ SOLN
4.0000 mg | Freq: Four times a day (QID) | INTRAMUSCULAR | Status: DC | PRN
  Administered 2013-11-09: 4 mg via INTRAVENOUS
  Filled 2013-11-09 (×2): qty 2

## 2013-11-09 MED ORDER — INSULIN ASPART 100 UNIT/ML ~~LOC~~ SOLN
0.0000 [IU] | Freq: Three times a day (TID) | SUBCUTANEOUS | Status: DC
  Administered 2013-11-10: 3 [IU] via SUBCUTANEOUS
  Administered 2013-11-10: 5 [IU] via SUBCUTANEOUS
  Administered 2013-11-10: 2 [IU] via SUBCUTANEOUS
  Administered 2013-11-11: 1 [IU] via SUBCUTANEOUS
  Administered 2013-11-11 (×2): 3 [IU] via SUBCUTANEOUS
  Administered 2013-11-12: 2 [IU] via SUBCUTANEOUS

## 2013-11-09 MED ORDER — PREGABALIN 75 MG PO CAPS
75.0000 mg | ORAL_CAPSULE | Freq: Two times a day (BID) | ORAL | Status: DC
  Administered 2013-11-10 – 2013-11-14 (×9): 75 mg via ORAL
  Filled 2013-11-09 (×8): qty 1

## 2013-11-09 MED ORDER — INSULIN ASPART 100 UNIT/ML ~~LOC~~ SOLN
0.0000 [IU] | Freq: Every day | SUBCUTANEOUS | Status: DC
  Administered 2013-11-10: 2 [IU] via SUBCUTANEOUS
  Administered 2013-11-11: 4 [IU] via SUBCUTANEOUS

## 2013-11-09 MED ORDER — METHOCARBAMOL 500 MG PO TABS
500.0000 mg | ORAL_TABLET | Freq: Four times a day (QID) | ORAL | Status: DC | PRN
  Administered 2013-11-12 (×2): 500 mg via ORAL
  Filled 2013-11-09 (×2): qty 1

## 2013-11-09 MED ORDER — PHENYLEPHRINE HCL 10 MG/ML IJ SOLN
INTRAMUSCULAR | Status: DC | PRN
  Administered 2013-11-09 (×4): 40 ug via INTRAVENOUS

## 2013-11-09 MED ORDER — LACTATED RINGERS IV SOLN
INTRAVENOUS | Status: DC
  Administered 2013-11-09: 50 mL/h via INTRAVENOUS

## 2013-11-09 SURGICAL SUPPLY — 50 items
BANDAGE ESMARK 6X9 LF (GAUZE/BANDAGES/DRESSINGS) ×1 IMPLANT
BANDAGE GAUZE ELAST BULKY 4 IN (GAUZE/BANDAGES/DRESSINGS) ×8 IMPLANT
BLADE SAW RECIP 87.9 MT (BLADE) ×3 IMPLANT
BNDG CMPR 9X6 STRL LF SNTH (GAUZE/BANDAGES/DRESSINGS) ×1
BNDG COHESIVE 4X5 TAN STRL (GAUZE/BANDAGES/DRESSINGS) ×1 IMPLANT
BNDG COHESIVE 6X5 TAN STRL LF (GAUZE/BANDAGES/DRESSINGS) ×4 IMPLANT
BNDG ESMARK 6X9 LF (GAUZE/BANDAGES/DRESSINGS) ×3
COVER SURGICAL LIGHT HANDLE (MISCELLANEOUS) ×3 IMPLANT
CUFF TOURNIQUET SINGLE 34IN LL (TOURNIQUET CUFF) ×3 IMPLANT
DRAPE ORTHO SPLIT 77X108 STRL (DRAPES) ×6
DRAPE PROXIMA HALF (DRAPES) ×3 IMPLANT
DRAPE SURG ORHT 6 SPLT 77X108 (DRAPES) ×2 IMPLANT
DRAPE U-SHAPE 47X51 STRL (DRAPES) ×3 IMPLANT
DRSG PAD ABDOMINAL 8X10 ST (GAUZE/BANDAGES/DRESSINGS) ×5 IMPLANT
ELECT CAUTERY BLADE 6.4 (BLADE) ×3 IMPLANT
ELECT REM PT RETURN 9FT ADLT (ELECTROSURGICAL) ×3
ELECTRODE REM PT RTRN 9FT ADLT (ELECTROSURGICAL) ×1 IMPLANT
EVACUATOR 1/8 PVC DRAIN (DRAIN) ×2 IMPLANT
FACESHIELD LNG OPTICON STERILE (SAFETY) ×2 IMPLANT
GAUZE XEROFORM 5X9 LF (GAUZE/BANDAGES/DRESSINGS) ×3 IMPLANT
GLOVE BIO SURGEON STRL SZ 6.5 (GLOVE) ×1 IMPLANT
GLOVE BIO SURGEON STRL SZ7 (GLOVE) ×2 IMPLANT
GLOVE BIO SURGEONS STRL SZ 6.5 (GLOVE) ×1
GLOVE BIOGEL PI IND STRL 8 (GLOVE) IMPLANT
GLOVE BIOGEL PI INDICATOR 8 (GLOVE) ×2
GLOVE BIOGEL PI ORTHO PRO 7.5 (GLOVE) ×2
GLOVE PI ORTHO PRO STRL 7.5 (GLOVE) IMPLANT
GLOVE SURG SS PI 7.5 STRL IVOR (GLOVE) ×8 IMPLANT
GLOVE SURG SS PI 8.0 STRL IVOR (GLOVE) ×2 IMPLANT
GOWN STRL REUS W/ TWL XL LVL3 (GOWN DISPOSABLE) ×1 IMPLANT
GOWN STRL REUS W/TWL XL LVL3 (GOWN DISPOSABLE) ×8 IMPLANT
KIT BASIN OR (CUSTOM PROCEDURE TRAY) ×3 IMPLANT
NS IRRIG 1000ML POUR BTL (IV SOLUTION) ×3 IMPLANT
PACK GENERAL/GYN (CUSTOM PROCEDURE TRAY) ×4 IMPLANT
PAD ARMBOARD 7.5X6 YLW CONV (MISCELLANEOUS) ×3 IMPLANT
PAD CAST 4YDX4 CTTN HI CHSV (CAST SUPPLIES) ×4 IMPLANT
PADDING CAST COTTON 4X4 STRL (CAST SUPPLIES)
SPONGE GAUZE 4X4 12PLY (GAUZE/BANDAGES/DRESSINGS) ×3 IMPLANT
SPONGE LAP 18X18 X RAY DECT (DISPOSABLE) IMPLANT
STAPLER VISISTAT 35W (STAPLE) IMPLANT
STOCKINETTE IMPERVIOUS LG (DRAPES) ×3 IMPLANT
SUT ETHILON 2 0 PSLX (SUTURE) ×8 IMPLANT
SUT PDS AB 0 CT 36 (SUTURE) ×4 IMPLANT
SUT SILK 0 TIES 10X30 (SUTURE) ×3 IMPLANT
SUT SILK 2 0 SH CR/8 (SUTURE) IMPLANT
SUT VIC AB 2-0 CT1 27 (SUTURE) ×6
SUT VIC AB 2-0 CT1 36 (SUTURE) ×1 IMPLANT
SUT VIC AB 2-0 CT1 TAPERPNT 27 (SUTURE) ×2 IMPLANT
TOWEL OR 17X24 6PK STRL BLUE (TOWEL DISPOSABLE) ×2 IMPLANT
TOWEL OR 17X26 10 PK STRL BLUE (TOWEL DISPOSABLE) ×5 IMPLANT

## 2013-11-09 NOTE — Consult Note (Signed)
Medical Consultation   [name] [name]  TIR:[id number]  DOB: 05-Dec-1945  DOA: 11/09/2013  PCP: [name], MD Primary cardiologist: Dr. Erick Alley  Requesting physician: Dr Eduard Roux  Reason for consultation: Medical comanagement  History of Present Illness: Patient is a 68 year old male with multiple medical problems including chronic systolic CHF , paroxysmal atrial fibrillation, LBBB, pacemaker, good, diabetes mellitus, hypertension, hyperlipidemia with history of chronic osteomyelitis of left foot and nonhealing ulcers. Patient underwent left below knee amputation today by Dr. Erlinda Hong. Medicine consult was requested for comanagement.  Allergies:  No Known Allergies    Past Medical History  Diagnosis Date  . GERD (gastroesophageal reflux disease)   . Obesity   . Cardiomyopathy, nonischemic     a. Cath 2003: mild nonobstructive CAD, EF 25% at that time.  . Hypertension   . Chronic systolic CHF (congestive heart failure)     a. NICM EF 25% dating back to at least 2003.  [name] PAF (paroxysmal atrial fibrillation)     a. Noted on ICD interrogation 2012;  b. coumadin d/c'd 01/2013.  [name] NSVT (nonsustained ventricular tachycardia)     a. Noted on ICD interrogation in 2011.  [name] LBBB (left bundle branch block)   . Kidney stone   . Critical lower limb ischemia   . High cholesterol   . PAD (peripheral artery disease)     a. 08/2013 Periph Angio/PTA: Abd Ao nl, RLE- 3v runoff, PT diff dzs, AT 90p, LLE 2v runoff, PT 100, AT 10m (diamondback ORA/chocolate balloon PTA).  . Type II diabetes mellitus   . Uncontrolled pain, Lt toe 09/21/2013  . Gangrenous toe, Lt toe 09/21/2013  . CAD (coronary artery disease)     a. Nonobstructive by cath 09/2001.  [name] Automatic implantable cardioverter-defibrillator in situ     a. 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  . Dementia   . Arthritis     Past Surgical History  Procedure Laterality Date  . Lithotripsy  2001  .  Cervical spine surgery  1994  . Cardiac defibrillator placement  06/2009    WITH GENERATOR REPLACED; BiV ICD  . US echocardiography  03/21/2008    EF 30-35%  . Cardiovascular stress test  03/20/2009    EF 33%  . Transluminal atherectomy tibial artery Left 09/12/2013  . Cardiac catheterization  10/01/2001    THERE WAS GLOBAL HYPOKINESIS AND EF 25%. THERE APPEARED TO BE GLOBAL DECREASE IN WALL MOTION  . Toe amputation  10/04/2013    LEFT GREAT TOE AND 4TH TOE   /   DR Erlinda Hong  . Amputation Left 10/04/2013    Procedure: LEFT GREAT TOE AND SECOND TOE AMPUTATION;  Surgeon: Marianna Payment, MD;  Location: [place];  Service: Orthopedics;  Laterality: Left;  . Colonoscopy      Social History:  reports that he has never smoked. He has never used smokeless tobacco. He reports that he does not drink alcohol or use illicit drugs.  Family History  Problem Relation Age of Onset  . Heart disease Mother   . Hypertension Mother   . Diabetes Mother   . Diabetes Father     Review of Systems:  Patient seen in the PACU Constitutional: Denies fever, chills, diaphoresis, appetite change and fatigue.  HEENT: Denies photophobia, eye pain, redness, hearing loss, ear pain, congestion, sore throat, rhinorrhea, sneezing, mouth sores, trouble swallowing, neck pain, neck stiffness and tinnitus.   Respiratory: Denies SOB, DOE, cough, chest tightness,  and wheezing.  Cardiovascular: Denies chest pain, palpitations and leg swelling.  Gastrointestinal: Denies nausea, vomiting, abdominal pain, diarrhea, constipation, blood in stool and abdominal distention.  Genitourinary: Denies dysuria, urgency, frequency, hematuria, flank pain and difficulty urinating.  Musculoskeletal: Denies myalgias, back pain, joint swelling Skin: Denies pallor, rash has chronic osteomyelitis of the left foot Neurological: Denies dizziness, seizures, syncope, weakness, light-headedness, numbness and headaches.  Hematological: Denies adenopathy.  Easy bruising, personal or family bleeding history  Psychiatric/Behavioral: Denies suicidal ideation, mood changes, confusion, nervousness, sleep disturbance and agitation   Physical Exam: Blood pressure 120/68, pulse 86, temperature 98.1 F (36.7 C), temperature source Oral, resp. rate 11, weight 80.701 kg (177 lb 14.6 oz), SpO2 98.00%.  General: Alert and awake,, not in any acute distress. HEENT: normocephalic, atraumatic, anicteric sclera, pupils reactive to light and accommodation, EOMI, oropharynx clear CVS: S1-S2 clear, no murmur rubs or gallops Chest: clear to auscultation bilaterally,  pacemaker in the left chest wall Abdomen: soft nontender, nondistended, normal bowel sounds, no organomegaly Extremities: Left amputation, dressing intact, dressing intact on the right lower extremity as well Neuro: Patient able to understand and verbalize answers, both handgrip intact, not able to move his left lower extremity due to recent surgery   Labs on Admission:  Basic Metabolic Panel:  Recent Labs Lab 11/08/13 0854  NA 143  K 3.7  CL 102  CO2 25  GLUCOSE 150*  BUN 26*  CREATININE 0.93  CALCIUM 9.4   Liver Function Tests: No results found for this basename: AST, ALT, ALKPHOS, BILITOT, PROT, ALBUMIN,  in the last 168 hours No results found for this basename: LIPASE, AMYLASE,  in the last 168 hours No results found for this basename: AMMONIA,  in the last 168 hours CBC:  Recent Labs Lab 11/08/13 0854  WBC 6.9  HGB 9.9*  HCT 30.0*  MCV 73.2*  PLT 260   Cardiac Enzymes: No results found for this basename: CKTOTAL, CKMB, CKMBINDEX, TROPONINI,  in the last 168 hours BNP: No components found with this basename: POCBNP,  CBG:  Recent Labs Lab 11/09/13 1231 11/09/13 1541  GLUCAP 114* 116*    Inpatient Medications:   Scheduled Meds: .  ceFAZolin (ANCEF) IV  2 g Intravenous On Call to OR  . HYDROmorphone       Continuous Infusions: . lactated ringers 50 mL/hr  (11/09/13 1244)     Radiological Exams on Admission: No results found.  Impression/Recommendations Principal Problem:   Foot osteomyelitis, left - Per the primary team, orthopedics - Pain control and DVT (per orthopedics)   Active Problems:   SYSTOLIC HEART FAILURE, CHRONIC - Currently compensated, avoid IV fluids once patient starts tolerating by mouth - 2-D echo in June 2014 had shown EF of 20-25% with diffuse hypokinesis grade 1 diastolic dysfunction - His cardiologist is Dr. Caryl Comes. - Please restart Lasix and spironolactone tomorrow, currently on Ringer lactate at 50 cc an hour. Please KVO fluids once patient is tolerating diet  - Continue aspirin statin, beta blocker, Plavix.     HTN (hypertension) - Continue Coreg, Aldactone    PAD (peripheral artery disease) - Continue aspirin, statin, restart Plavix postop    Hyperlipidemia - Restart statins    PAF (paroxysmal atrial fibrillation) - Continue Coreg, currently on aspirin and Plavix  - Not on any anticoagulation     Type II or unspecified type diabetes mellitus - Patient is on 45 units Levemir at bedtime. Depending on how his oral intake is postop, I will restart Levemir at 20 units  tonight and sliding scale insulin.    patient's PCP is Dr. Marcellus Scott, he will continue care from tomorrow morning.   Time Spent on  consultation 26 minutes   Domenick Quebedeaux M.D. [facility] 11/09/2013, 4:36 PM

## 2013-11-09 NOTE — Progress Notes (Signed)
Orthopedic Tech Progress Note Patient Details:  COLON RUETH 01-23-1946 [id number]  Ortho Devices Ortho Device/Splint Location: put ohf on bed Ortho Device/Splint Interventions: Ordered;Application   [name] 11/09/2013, 8:46 PM

## 2013-11-09 NOTE — H&P (Signed)
PREOPERATIVE H&P  Chief Complaint: Left foot osteomyelitis, non-healing ulcers  HPI: [name] [name] is a 68 y.o. male who presents for surgical treatment of Left foot osteomyelitis, non-healing ulcers.  He denies any changes in medical history.    Past Medical History  Diagnosis Date  . GERD (gastroesophageal reflux disease)   . Obesity   . Cardiomyopathy, nonischemic     a. Cath 2003: mild nonobstructive CAD, EF 25% at that time.  . Hypertension   . Chronic systolic CHF (congestive heart failure)     a. NICM EF 25% dating back to at least 2003.  [name] PAF (paroxysmal atrial fibrillation)     a. Noted on ICD interrogation 2012;  b. coumadin d/c'd 01/2013.  [name] NSVT (nonsustained ventricular tachycardia)     a. Noted on ICD interrogation in 2011.  [name] LBBB (left bundle branch block)   . Kidney stone   . Critical lower limb ischemia   . High cholesterol   . PAD (peripheral artery disease)     a. 08/2013 Periph Angio/PTA: Abd Ao nl, RLE- 3v runoff, PT diff dzs, AT 90p, LLE 2v runoff, PT 100, AT 71m(diamondback ORA/chocolate balloon PTA).  . Type II diabetes mellitus   . Uncontrolled pain, Lt toe 09/21/2013  . Gangrenous toe, Lt toe 09/21/2013  . CAD (coronary artery disease)     a. Nonobstructive by cath 09/2001.  .Marland KitchenAutomatic implantable cardioverter-defibrillator in situ     a. 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  . Dementia   . Arthritis    Past Surgical History  Procedure Laterality Date  . Lithotripsy  2001  . Cervical spine surgery  1994  . Cardiac defibrillator placement  06/2009    WITH GENERATOR REPLACED; BiV ICD  . UKoreaechocardiography  03/21/2008    EF 30-35%  . Cardiovascular stress test  03/20/2009    EF 33%  . Transluminal atherectomy tibial artery Left 09/12/2013  . Cardiac catheterization  10/01/2001    THERE WAS GLOBAL HYPOKINESIS AND EF 25%. THERE APPEARED TO BE GLOBAL DECREASE IN WALL MOTION  . Toe amputation  10/04/2013    LEFT  GREAT TOE AND 4TH TOE   /   DR XErlinda Hong . Amputation Left 10/04/2013    Procedure: LEFT GREAT TOE AND SECOND TOE AMPUTATION;  Surgeon: NMarianna Payment MD;  [place]  Service: Orthopedics;  Laterality: Left;  . Colonoscopy     History   Social History  . Marital Status: Married    Spouse Name: N/A    Number of Children: N/A  . Years of Education: N/A   Social History Main Topics  . Smoking status: Never Smoker   . Smokeless tobacco: Never Used  . Alcohol Use: No  . Drug Use: No  . Sexual Activity: Yes   Other Topics Concern  . None   Social History Narrative  . None   Family History  Problem Relation Age of Onset  . Heart disease Mother   . Hypertension Mother   . Diabetes Mother   . Diabetes Father    No Known Allergies Prior to Admission medications   Medication Sig Start Date End Date Taking? Authorizing Provider  aspirin EC 81 MG tablet Take 81 mg by mouth daily.   Yes Historical Provider, MD  atorvastatin (LIPITOR) 10 MG tablet Take 10 mg by mouth daily.     Yes Historical Provider, MD  carvedilol (COREG) 12.5 MG tablet Take 12.5 mg by  mouth 2 (two) times daily with a meal.   Yes Historical Provider, MD  clopidogrel (PLAVIX) 75 MG tablet Take 1 tablet (75 mg total) by mouth daily with breakfast. 09/13/13  Yes Rogelia Mire, NP  doxycycline (VIBRA-TABS) 100 MG tablet Take 100 mg by mouth 2 (two) times daily. Toe wound. Started on 09/02/13 09/02/13  Yes Historical Provider, MD  esomeprazole (NEXIUM) 40 MG capsule Take 40 mg by mouth daily as needed (acid reflux).    Yes Historical Provider, MD  furosemide (LASIX) 40 MG tablet Take 1 tablet (40 mg total) by mouth 2 (two) times daily. 06/28/13  Yes Deboraha Sprang, MD  HYDROcodone-acetaminophen (NORCO) 5-325 MG per tablet Take 1-2 tablets by mouth every 6 (six) hours as needed. 10/06/13  Yes Ceasar Decandia Eduard Roux, MD  insulin detemir (LEVEMIR) 100 UNIT/ML injection Inject 45 Units into the skin at bedtime. 02/18/13  Yes  Orson Eva, MD  metFORMIN (GLUCOPHAGE) 500 MG tablet Take 1 tablet (500 mg total) by mouth 2 (two) times daily. 09/13/13  Yes Rogelia Mire, NP  Multiple Vitamins-Minerals (MULTIVITAMIN PO) Take 1 tablet by mouth daily.   Yes Historical Provider, MD  NOVOLOG 100 UNIT/ML injection Inject 4 Units into the skin as needed for high blood sugar.  04/20/13  Yes Historical Provider, MD  omeprazole (PRILOSEC) 20 MG capsule Take 20 mg by mouth daily.  09/14/13  Yes Historical Provider, MD  SANTYL ointment Apply 1 application topically every other day as needed (wounds).  09/16/13  Yes Historical Provider, MD  silver sulfADIAZINE (SILVADENE) 1 % cream Apply 1 application topically daily.  09/26/13  Yes Historical Provider, MD  spironolactone (ALDACTONE) 25 MG tablet Take 0.5 tablets (12.5 mg total) by mouth daily. 06/28/13  Yes Deboraha Sprang, MD  vitamin E (VITAMIN E) 1000 UNIT capsule Take 1,000 Units by mouth daily.   Yes Historical Provider, MD  ACCU-CHEK AVIVA PLUS test strip 1 each by Other route daily as needed (blood glucose check).  09/15/13   Historical Provider, MD  ACCU-CHEK SOFTCLIX LANCETS lancets 1 each by Other route daily as needed (blood glucose check).  09/15/13   Historical Provider, MD  Blood Glucose Monitoring Suppl (ACCU-CHEK AVIVA PLUS) W/DEVICE KIT  09/15/13   Historical Provider, MD     Positive ROS: All other systems have been reviewed and were otherwise negative with the exception of those mentioned in the HPI and as above.  Physical Exam: General: Alert, no acute distress Cardiovascular: No pedal edema Respiratory: No cyanosis, no use of accessory musculature GI: No organomegaly, abdomen is soft and non-tender Skin: No lesions in the area of chief complaint Neurologic: Sensation intact distally Psychiatric: Patient is competent for consent with normal mood and affect Lymphatic: No axillary or cervical lymphadenopathy  MUSCULOSKELETAL:  LLE exam unchanged from  monday  Assessment: Left foot osteomyelitis, non-healing ulcers  Plan: Plan for Procedure(s): LEFT AMPUTATION BELOW KNEE  The risks benefits and alternatives were discussed with the patient including but not limited to the risks of nonoperative treatment, versus surgical intervention including infection, bleeding, nerve injury,  blood clots, cardiopulmonary complications, morbidity, mortality, among others, and they were willing to proceed.   Marianna Payment, MD   11/09/2013 1:20 PM

## 2013-11-09 NOTE — Anesthesia Preprocedure Evaluation (Addendum)
Anesthesia Evaluation  Patient identified by MRN, date of birth, ID band Patient awake    Reviewed: Allergy & Precautions, H&P , NPO status , Patient's Chart, lab work & pertinent test results  History of Anesthesia Complications Negative for: history of anesthetic complications  Airway Mallampati: II TM Distance: >3 FB Neck ROM: Full    Dental  (+) Poor Dentition, Dental Advisory Given, Missing   Pulmonary neg pulmonary ROS,    Pulmonary exam normal       Cardiovascular hypertension, + CAD, + Peripheral Vascular Disease and +CHF + Cardiac Defibrillator  Echo 01/2013: Left ventricle: The cavity size was mildly dilated. Wall   thickness was normal. Systolic function was severely   reduced. The estimated ejection fraction was in the range   of 20% to 25%. Diffuse hypokinesis. Doppler parameters are   consistent with abnormal left ventricular relaxation   (grade 1 diastolic dysfunction). - Mitral valve: Mild regurgitation.    Neuro/Psych Dementia negative psych ROS   GI/Hepatic Neg liver ROS, GERD-  ,  Endo/Other  diabetes  Renal/GU negative Renal ROS     Musculoskeletal   Abdominal   Peds  Hematology   Anesthesia Other Findings   Reproductive/Obstetrics                         Anesthesia Physical Anesthesia Plan  ASA: III  Anesthesia Plan: General   Post-op Pain Management:    Induction: Intravenous  Airway Management Planned: LMA  Additional Equipment:   Intra-op Plan:   Post-operative Plan: Extubation in OR  Informed Consent: I have reviewed the patients History and Physical, chart, labs and discussed the procedure including the risks, benefits and alternatives for the proposed anesthesia with the patient or authorized representative who has indicated his/her understanding and acceptance.   Dental advisory given  Plan Discussed with: CRNA, Anesthesiologist and  Surgeon  Anesthesia Plan Comments:        Anesthesia Quick Evaluation

## 2013-11-09 NOTE — Anesthesia Procedure Notes (Signed)
Procedure Name: LMA Insertion Date/Time: 11/09/2013 1:43 PM Performed by: Raphael Gibney T Pre-anesthesia Checklist: Patient identified, Timeout performed, Emergency Drugs available, Suction available and Patient being monitored Patient Re-evaluated:Patient Re-evaluated prior to inductionOxygen Delivery Method: Circle system utilized and Simple face mask Preoxygenation: Pre-oxygenation with 100% oxygen Intubation Type: IV induction Ventilation: Mask ventilation without difficulty LMA: LMA inserted LMA Size: 5.0 Number of attempts: 1 Airway Equipment and Method: Patient positioned with wedge pillow Tube secured with: Tape Dental Injury: Teeth and Oropharynx as per pre-operative assessment

## 2013-11-09 NOTE — Op Note (Addendum)
Date of surgery: 11/09/2013   Preoperative diagnosis: Left foot osteomyelitis and nonhealing ischemic ulcers  Postoperative diagnosis: Same  Procedure: Left transtibial amputation  Surgeon: Eduard Roux, M.D.  Anesthesia: Gen.  Estimated blood loss: 856 cc  Complications: None  Addition to pats use: Stable  Indications for procedure: Mr. tober is a 68 -year-old gentleman who I treated in the past with surgery local wound care. This failed to heal his wounds. He presents today for the above-mentioned procedure. The risks, benefits, and alternatives to surgery were again discussed with the patient and his wife and they wish to proceed.  Description of procedure: The patient was identified in the preoperative holding area. The operative extremity and procedure were confirmed with the patient and marked by the surgeon. He is brought back to the operating room. His placed supine on the table. General anesthesia was induced the anesthesiologist. A well-padded nonsterile tourniquet was placed on the upper left thigh. The left lower extremity was prepped and draped in standard sterile fashion. Preoperative antibiotics were given. A timeout was performed. Unmeasured approximately 18 cm distal to the joint line for the tibial osteotomy.  The extremity was elevated and the tourniquet was inflated to 350 mmHg.  A fishmouth incision with a large posterior flap was used. Full-thickness flaps were raised off of the bone. The neurovascular structures were identified and ligated. The tibia was osteotomized at the plantar level. The saw was used to bevel the distal tibia to eliminate any sharp edges. The fibula was then identified and osteotomized approximately 1 cm proximal to the level of the tibial osteotomy. The amputation was then completed by incising the rest of the soft tissue. The tourniquet was deflated. Hemostasis was obtained. The wound was irrigated thoroughly with normal saline. A medium Hemovac drain  was placed in the wound. The wounds closed in layer fashion using 0 PDS for the fascial layer 2-0 Vicryl for the deep in layer and 2-0 nylon for the skin. Of note there was no tension on the wound closure. A sterile dressing was applied and the patient awoke from anesthesia uneventfully and was transferred to the PACU in stable condition.  Disposition: Patient will be nonweightbearing to the left lower extremity. He will the up with physical therapy in the morning. We will allow him to resume his Plavix and aspirin in the morning.  I will obtain a hospitalist consult for comanagement of his comorbidities.    Azucena Cecil, MD [facility] [phone] 3:35 PM

## 2013-11-09 NOTE — Preoperative (Signed)
Beta Blockers   Reason not to administer Beta Blockers:coreg

## 2013-11-09 NOTE — Transfer of Care (Signed)
Immediate Anesthesia Transfer of Care Note  [name]  Procedure(s) Performed: Procedure(s): LEFT AMPUTATION BELOW KNEE (Left)  Patient Location: PACU  Anesthesia Type:General  Level of Consciousness: awake and alert   Airway & Oxygen Therapy: Patient Spontanous Breathing and Patient connected to nasal cannula oxygen  Post-op Assessment: Report given to PACU RN, Post -op Vital signs reviewed and stable and Patient moving all extremities X 4  Post vital signs: Reviewed and stable  Complications: No apparent anesthesia complications

## 2013-11-10 ENCOUNTER — Encounter (HOSPITAL_COMMUNITY): Payer: Self-pay | Admitting: General Practice

## 2013-11-10 DIAGNOSIS — I739 Peripheral vascular disease, unspecified: Secondary | ICD-10-CM

## 2013-11-10 DIAGNOSIS — S88119A Complete traumatic amputation at level between knee and ankle, unspecified lower leg, initial encounter: Secondary | ICD-10-CM

## 2013-11-10 DIAGNOSIS — L98499 Non-pressure chronic ulcer of skin of other sites with unspecified severity: Secondary | ICD-10-CM

## 2013-11-10 LAB — COMPREHENSIVE METABOLIC PANEL
ALBUMIN: 2.6 g/dL — AB (ref 3.5–5.2)
ALT: 6 U/L (ref 0–53)
AST: 12 U/L (ref 0–37)
Alkaline Phosphatase: 50 U/L (ref 39–117)
BUN: 14 mg/dL (ref 6–23)
CO2: 27 mEq/L (ref 19–32)
CREATININE: 0.86 mg/dL (ref 0.50–1.35)
Calcium: 8.6 mg/dL (ref 8.4–10.5)
Chloride: 103 mEq/L (ref 96–112)
GFR calc Af Amer: >90 mL/min (ref >90)
GFR calc non Af Amer: 86 mL/min — ABNORMAL LOW (ref >90)
Glucose, Bld: 171 mg/dL — ABNORMAL HIGH (ref 70–99)
Potassium: 3.6 mEq/L — ABNORMAL LOW (ref 3.7–5.3)
Sodium: 144 mEq/L (ref 137–147)
Total Bilirubin: 0.3 mg/dL (ref 0.3–1.2)
Total Protein: 6.2 g/dL (ref 6.0–8.3)

## 2013-11-10 LAB — HEMOGLOBIN A1C
HEMOGLOBIN A1C: 8.6 % — AB (ref <5.7)
MEAN PLASMA GLUCOSE: 200 mg/dL — AB (ref <117)

## 2013-11-10 LAB — CBC
HCT: 26.6 % — ABNORMAL LOW (ref 39.0–52.0)
Hemoglobin: 8.7 g/dL — ABNORMAL LOW (ref 13.0–17.0)
MCH: 24.2 pg — ABNORMAL LOW (ref 26.0–34.0)
MCHC: 32.7 g/dL (ref 30.0–36.0)
MCV: 74.1 fL — ABNORMAL LOW (ref 78.0–100.0)
PLATELETS: 250 10*3/uL (ref 150–400)
RBC: 3.59 MIL/uL — ABNORMAL LOW (ref 4.22–5.81)
RDW: 17.4 % — AB (ref 11.5–15.5)
WBC: 9.7 10*3/uL (ref 4.0–10.5)

## 2013-11-10 LAB — GLUCOSE, CAPILLARY
GLUCOSE-CAPILLARY: 179 mg/dL — AB (ref 70–99)
GLUCOSE-CAPILLARY: 260 mg/dL — AB (ref 70–99)
Glucose-Capillary: 222 mg/dL — ABNORMAL HIGH (ref 70–99)
Glucose-Capillary: 201 mg/dL — ABNORMAL HIGH (ref 70–99)

## 2013-11-10 MED ORDER — ADULT MULTIVITAMIN W/MINERALS CH
1.0000 | ORAL_TABLET | Freq: Every day | ORAL | Status: DC
  Administered 2013-11-10 – 2013-11-14 (×5): 1 via ORAL
  Filled 2013-11-10 (×6): qty 1

## 2013-11-10 MED ORDER — INSULIN DETEMIR 100 UNIT/ML ~~LOC~~ SOLN
20.0000 [IU] | Freq: Every day | SUBCUTANEOUS | Status: DC
  Administered 2013-11-10 – 2013-11-11 (×2): 20 [IU] via SUBCUTANEOUS
  Filled 2013-11-10 (×2): qty 0.2

## 2013-11-10 MED ORDER — JUVEN PO PACK
1.0000 | PACK | Freq: Two times a day (BID) | ORAL | Status: DC
  Administered 2013-11-10 – 2013-11-14 (×8): 1 via ORAL
  Filled 2013-11-10 (×11): qty 1

## 2013-11-10 MED ORDER — ASPIRIN EC 325 MG PO TBEC
325.0000 mg | DELAYED_RELEASE_TABLET | Freq: Every day | ORAL | Status: DC
  Administered 2013-11-10 – 2013-11-14 (×5): 325 mg via ORAL
  Filled 2013-11-10 (×5): qty 1

## 2013-11-10 MED ORDER — TRAMADOL HCL 50 MG PO TABS
50.0000 mg | ORAL_TABLET | Freq: Four times a day (QID) | ORAL | Status: DC | PRN
  Administered 2013-11-10 – 2013-11-11 (×2): 50 mg via ORAL
  Filled 2013-11-10 (×2): qty 1

## 2013-11-10 MED ORDER — FUROSEMIDE 40 MG PO TABS
40.0000 mg | ORAL_TABLET | Freq: Every day | ORAL | Status: DC
  Administered 2013-11-12 – 2013-11-14 (×3): 40 mg via ORAL
  Filled 2013-11-10 (×5): qty 1

## 2013-11-10 NOTE — Progress Notes (Signed)
Dressing change per order. R big toe and 2nd toe wounds. Small amount of bldy drainage. R toes and foot have dry and flaky skin. Toes cleaned with normal saline. Silvadene and dry kerlix dressing applied to affected toes. No further s/sx of infection noted of affected toes. R lateral heel noted a small circular nonstageable black mushy wound. Pt's wife at bedside. Pt's wife states pt has a history of a pressure ulcer to R heel in the recent past. Pt's wife brought prafo boot from home. Kerlix dressing applied around heel area for protection. Prafo boot applied. Right dorsalis pedis pulse present but weak. +cap refill. Toes are warm to touch. Rept to Dr. Lysle Rubens. Pt's BP 109/53 P 90. Orders received to discontinue pt's aldactone and place BP parameters on pt's Lasix. Pt is exhibiting no orthostatic signs or symptoms. Also rept to Dr. Delanna Ahmadi follow up hgb today is 8.7. Per his order, will repeat CBC in AM.

## 2013-11-10 NOTE — Clinical Documentation Improvement (Deleted)
  Please provide greater specificity regarding the patient's nonhealing ischemic ulcers in the progress notes and discharge summary:   - Nonhealing ischemic ulcers 2/2 atherosclerotic peripheral artery disease   - Other Cause   - Unable to Clinically Determine  Clinical Information:  - "Nonhealing ischemic ulcers" documented in dictated operative note   - Known history of peripheral artery disease s/p percutaneous intervention 08/2013     Thank You,  Erling Conte ,RN BSN CCDS Certified Clinical Documentation Specialist:  [phone].[place]- [facility] Information Management

## 2013-11-10 NOTE — Evaluation (Signed)
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  Clinical Impression  Pt adm with the above dx. Pt's functional mobility limited by pain and fatigue. Pt to benefit from CIR to improve functional mobility and transfers to mod I for safe return home. Pt to benefit from skilled acute PT to address deficits listed below. Pt with good family support at home from wife and daughter.     PT Assessment  Patient needs continued PT services    Follow Up Recommendations  CIR    Does the patient have the potential to tolerate intense rehabilitation      Barriers to Discharge Inaccessible home environment steps to enter home and full bathroom on second floor    Equipment Recommendations  Rolling walker with 5" wheels    Recommendations for Other Services Rehab consult   Frequency Min 3X/week    Precautions / Restrictions Precautions Precautions: Fall Restrictions Weight Bearing Restrictions: Yes LLE Weight Bearing: Non weight bearing   Pertinent Vitals/Pain Pt reports severe pain in L LE- educated on rubbing/tapping techniques to reduce pain      Mobility  Bed Mobility Overal bed mobility: Needs Assistance Bed Mobility: Supine to Sit Supine to sit: Mod assist General bed mobility comments: verbal cues for hand placement and safety. mod A for LE EOB and truink support coming to sit Transfers Overall transfer level: Needs assistance Equipment used: Rolling walker (2 wheeled) Transfers: Sit to/from [facility] Sit to Stand: Mod assist Stand pivot transfers: Mod assist General transfer comment: transfers perfromed with a RW. verbal cues for sequencing and mod A for balance support         PT Diagnosis: Difficulty walking  PT  Problem List: Decreased strength;Decreased range of motion;Decreased activity tolerance;Decreased balance;Decreased mobility;Decreased knowledge of use of DME PT Treatment Interventions: DME instruction;Gait training;Therapeutic activities;Functional mobility training;Therapeutic exercise;Balance training;Patient/family education     PT Goals(Current goals can be found in the care plan section) Acute Rehab PT Goals Patient Stated Goal: go to rehab then home PT Goal Formulation: With patient Time For Goal Achievement: 11/17/13 Potential to Achieve Goals: Good  Visit Information  Last PT Received On: 11/10/13 Assistance Needed: +1 History of Present Illness: 68 y/o male s/p L transtibial amputation.       Prior [place] expects to be discharged to:: Unsure Living Arrangements: Spouse/significant other Available Help at Discharge: Family Type of Home: House Home Access: Stairs to enter Technical brewer of Steps: 2 Entrance Stairs-Rails: None Home Layout: Two level (able to live on main level but full bathroom is on 2nd story) Alternate Level Stairs-Number of Steps: one flight Alternate Level Stairs-Rails: Right Home Equipment: Walker - 2 wheels;Wheelchair - manual Additional Comments: pt's RW is his brothers. pt will need one of his own Prior Function Level of Independence: Needs assistance Gait / Transfers Assistance Needed: amb with RW PRN  ADL's / Homemaking Assistance Needed: wife A with bathing back side, dressing as needed, and cooking Comments: pt reported he needed help at times but reports to be as independent as he can be  Communication Communication: No difficulties Dominant Hand: Right    Cognition  Cognition Arousal/Alertness: Awake/alert Behavior During Therapy: WFL for tasks assessed/performed Overall Cognitive Status: Within Functional Limits for tasks assessed    Extremity/Trunk  Assessment Upper Extremity  Assessment Upper Extremity Assessment: Overall WFL for tasks assessed Lower Extremity Assessment Lower Extremity Assessment: LLE deficits/detail LLE Deficits / Details: s/p Lt BKA LLE: Unable to fully assess due to pain Cervical / Trunk Assessment Cervical / Trunk Assessment: Normal   Balance Balance Overall balance assessment: Needs assistance Sitting-balance support: Bilateral upper extremity supported Sitting balance-Leahy Scale: Poor Sitting balance - Comments: req mod A to come to sit then min A to maintain sitting posture Postural control: Posterior lean Standing balance support: Bilateral upper extremity supported Standing balance-Leahy Scale: Poor Standing balance comment: mod A to maintain balance. pt req RW for stability General Comments General comments (skin integrity, edema, etc.): R foot wrapped in bandages to covers dry cracked skin  End of Session PT - End of Session Equipment Utilized During Treatment: Gait belt;Oxygen (2L O2 on wall. RW) Activity Tolerance: Patient limited by fatigue Patient left: in chair;with call bell/phone within reach;with family/visitor present Nurse Communication: Mobility status  GP    Manley Mason, SPT [place], [place], PT 11/10/2013, 11:37 AM

## 2013-11-10 NOTE — Anesthesia Postprocedure Evaluation (Signed)
  Anesthesia Post-op Note  [name]  Procedure(s) Performed: Procedure(s): LEFT AMPUTATION BELOW KNEE (Left)  Patient Location: PACU  Anesthesia Type:General  Level of Consciousness: awake, alert  and oriented  Airway and Oxygen Therapy: Patient Spontanous Breathing  Post-op Pain: moderate  Post-op Assessment: Post-op Vital signs reviewed, Patient's Cardiovascular Status Stable, Respiratory Function Stable, Patent Airway, No signs of Nausea or vomiting and Pain level controlled  Post-op Vital Signs: Reviewed and stable  Complications: No apparent anesthesia complications

## 2013-11-10 NOTE — Evaluation (Signed)
Physical Therapy Evaluation Patient Details Name: [name] [name] MRN: [id number] DOB: [birth year] Today's Date: 11/10/2013 Time: 2706-2376 PT Time Calculation (min): 17 min  PT Assessment / Plan / Recommendation History of Present Illness  68 y/o male s/p L transtibial amputation.  Clinical Impression  Pt adm with the above dx. Pt's functional mobility limited by pain and fatigue. Pt to benefit from CIR to improve functional mobility to mod I for safe return home. Pt to benefit from skilled acute PT to address deficits listed below.     PT Assessment  Patient needs continued PT services    Follow Up Recommendations  CIR    Does the patient have the potential to tolerate intense rehabilitation      Barriers to Discharge        Equipment Recommendations  Rolling walker with 5" wheels    Recommendations for Other Services Rehab consult   Frequency Min 3X/week    Precautions / Restrictions Precautions Precautions: Fall Restrictions Weight Bearing Restrictions: Yes LLE Weight Bearing: Non weight bearing   Pertinent Vitals/Pain Pt reports severe pain in L LE      Mobility  Bed Mobility Overal bed mobility: Needs Assistance Bed Mobility: Supine to Sit Supine to sit: Mod assist General bed mobility comments: verbal cues for hand placement and safety. mod A for LE EOB and truink support coming to sit Transfers Overall transfer level: Needs assistance Equipment used: Rolling walker (2 wheeled) Transfers: Sit to/from [facility] Sit to Stand: Mod assist Stand pivot transfers: Mod assist General transfer comment: transfers perfromed with a RW. verbal cues for sequencing and mod A for balance support    Exercises     PT Diagnosis: Difficulty walking  PT Problem List: Decreased strength;Decreased range of motion;Decreased activity tolerance;Decreased balance;Decreased mobility;Decreased knowledge of use of DME PT Treatment Interventions: DME  instruction;Gait training;Therapeutic activities;Functional mobility training;Therapeutic exercise;Balance training;Patient/family education     PT Goals(Current goals can be found in the care plan section) Acute Rehab PT Goals Patient Stated Goal: go to rehab then home PT Goal Formulation: With patient Time For Goal Achievement: 11/17/13 Potential to Achieve Goals: Good  Visit Information  Last PT Received On: 11/10/13 Assistance Needed: +1 History of Present Illness: 68 y/o male s/p L transtibial amputation.       Prior [place] expects to be discharged to:: Unsure Living Arrangements: Spouse/significant other Available Help at Discharge: Family Type of Home: House Home Access: Stairs to enter Technical brewer of Steps: 2 Entrance Stairs-Rails: None Home Layout: Two level (able to live on main level but full bathroom is on 2nd story) Alternate Level Stairs-Number of Steps: one flight Alternate Level Stairs-Rails: Right Home Equipment: Walker - 2 wheels;Wheelchair - manual Additional Comments: pt's RW is his brothers. pt will need one of his own Prior Function Level of Independence: Needs assistance Gait / Transfers Assistance Needed: needs RW ADL's / Homemaking Assistance Needed: wife A with bathing back side, dressing as needed, and cooking Communication Communication: No difficulties Dominant Hand: Right    Cognition  Cognition Arousal/Alertness: Awake/alert Behavior During Therapy: WFL for tasks assessed/performed Overall Cognitive Status: Within Functional Limits for tasks assessed    Extremity/Trunk Assessment Upper Extremity Assessment Upper Extremity Assessment: Overall WFL for tasks assessed Lower Extremity Assessment Lower Extremity Assessment: LLE deficits/detail LLE Deficits / Details: BKA LLE: Unable to fully assess due to pain   Balance Balance Overall balance assessment: Needs assistance Sitting-balance support:  Bilateral upper extremity supported Sitting balance-Leahy  Scale: Poor Sitting balance - Comments: req mod A to come to sit then min A to maintain sitting posture Postural control: Posterior lean Standing balance support: Bilateral upper extremity supported Standing balance-Leahy Scale: Poor Standing balance comment: mod A to maintain balance. pt req RW for stability General Comments General comments (skin integrity, edema, etc.): R foot wrapped in bandages to covers dry cracked skin  End of Session PT - End of Session Equipment Utilized During Treatment: Gait belt;Oxygen (2L O2 on wall. RW) Activity Tolerance: Patient limited by fatigue Patient left: in chair;with call bell/phone within reach;with family/visitor present Nurse Communication: Mobility status  GP     Sierria Bruney SPT 11/10/2013, 10:51 AM

## 2013-11-10 NOTE — Care Management Note (Signed)
CARE MANAGEMENT NOTE 11/10/2013  Patient:  [name] [name], [name] [name]   Account Number:  [id number]  Date Initiated:  11/10/2013  Documentation initiated by:  Ricki Miller  Subjective/Objective Assessment:   68 yr old male s/p left BKA.     Action/Plan:   Patient is being screened by CIR. Case manager will follow.   Anticipated DC Date:     Anticipated DC Plan:        DC Planning Services  CM consult      Choice offered to / List presented to:             Status of service:  In process, will continue to follow

## 2013-11-10 NOTE — Progress Notes (Signed)
INITIAL NUTRITION ASSESSMENT  DOCUMENTATION CODES Per approved criteria  -Not Applicable   INTERVENTION:  One package of Juven PO BID  MVI daily   NUTRITION DIAGNOSIS:  Increased nutrient needs related to wounds as evidenced by estimated needs.   Goal:  Pt to meet >/= 90% of their estimated nutrition needs   Monitor:  PO intake, weight trend, supplement acceptance, labs   Reason for Assessment: Pt identified as at nutrition risk on the Malnutrition Screen Tool  68 y.o. male  Admitting Dx: Foot osteomyelitis, left  ASSESSMENT: Pt admitted for surgical treatment of left foot osteomyelitis and non-healing ulcers.  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  Nutrition-focused physical exam WNL.  Pt reports no recent weight changes and good appetite PTA. Per pt he is nauseated this am and didn't eat much breakfast. MD has changed his pain medications. Pt is willing to do Juven.   Height: Ht Readings from Last 1 Encounters:  11/08/13 5\' 9"  (1.753 m)    Weight: Wt Readings from Last 1 Encounters:  11/09/13 177 lb 14.6 oz (80.701 kg)    Ideal Body Weight: 67.9 kg   % Ideal Body Weight: 119%  Wt Readings from Last 10 Encounters:  11/09/13 177 lb 14.6 oz (80.701 kg)  11/09/13 177 lb 14.6 oz (80.701 kg)  11/08/13 177 lb 12.8 oz (80.65 kg)  09/30/13 177 lb 14.4 oz (80.695 kg)  09/21/13 181 lb (82.101 kg)  09/13/13 192 lb 7.4 oz (87.3 kg)  09/13/13 192 lb 7.4 oz (87.3 kg)  09/06/13 185 lb (83.915 kg)  08/30/13 178 lb (80.74 kg)  07/26/13 188 lb 6.4 oz (85.458 kg)    Usual Body Weight: 177 lb   % Usual Body Weight: 100%  BMI:  28.1 - overweight  Estimated Nutritional Needs: Kcal: 1900-2100  Protein: 95-110 grams  Fluid: 1.2 L/day  Skin:  New Left BKA Right heel incision  Diet Order: Full Liquid  EDUCATION NEEDS: -No education needs identified at this time   Intake/Output Summary (Last 24 hours) at 11/10/13 0959 Last data filed at 11/10/13 0525  Gross per 24 hour   Intake   1120 ml  Output    100 ml  Net   1020 ml    Last BM: 3/17   Labs:   Recent Labs Lab 11/08/13 0854 11/10/13 0624  NA 143 144  K 3.7 3.6*  CL 102 103  CO2 25 27  BUN 26* 14  CREATININE 0.93 0.86  CALCIUM 9.4 8.6  GLUCOSE 150* 171*    CBG (last 3)   Recent Labs  11/09/13 1541 11/09/13 2148 11/10/13 0645  GLUCAP 116* 183* 179*   Lab Results  Component Value Date   HGBA1C 8.6* 11/09/2013   Scheduled Meds: . aspirin EC  325 mg Oral Daily  . atorvastatin  10 mg Oral q1800  . carvedilol  12.5 mg Oral BID WC  . clopidogrel  75 mg Oral Q breakfast  . furosemide  40 mg Oral Daily  . insulin aspart  0-5 Units Subcutaneous QHS  . insulin aspart  0-9 Units Subcutaneous TID WC  . insulin detemir  20 Units Subcutaneous QHS  . pantoprazole  80 mg Oral Q1200  . pregabalin  75 mg Oral BID  . senna  1 tablet Oral BID  . silver sulfADIAZINE  1 application Topical BID  . spironolactone  12.5 mg Oral Daily    Continuous Infusions: . sodium chloride 100 mL/hr at 11/10/13 0432  . lactated ringers 50  mL/hr (11/09/13 1244)    Past Medical History  Diagnosis Date  . GERD (gastroesophageal reflux disease)   . Obesity   . Cardiomyopathy, nonischemic     a. Cath 2003: mild nonobstructive CAD, EF 25% at that time.  . Hypertension   . Chronic systolic CHF (congestive heart failure)     a. NICM EF 25% dating back to at least 2003.  [name] PAF (paroxysmal atrial fibrillation)     a. Noted on ICD interrogation 2012;  b. coumadin d/c'd 01/2013.  [name] NSVT (nonsustained ventricular tachycardia)     a. Noted on ICD interrogation in 2011.  [name] LBBB (left bundle branch block)   . Kidney stone   . Critical lower limb ischemia   . High cholesterol   . PAD (peripheral artery disease)     a. 08/2013 Periph Angio/PTA: Abd Ao nl, RLE- 3v runoff, PT diff dzs, AT 90p, LLE 2v runoff, PT 100, AT 44m (diamondback ORA/chocolate balloon PTA).  . Type II diabetes mellitus   . Uncontrolled pain,  Lt toe 09/21/2013  . Gangrenous toe, Lt toe 09/21/2013  . CAD (coronary artery disease)     a. Nonobstructive by cath 09/2001.  [name] Automatic implantable cardioverter-defibrillator in situ     a. 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  . Dementia   . Arthritis     Past Surgical History  Procedure Laterality Date  . Lithotripsy  2001  . Cervical spine surgery  1994  . Cardiac defibrillator placement  06/2009    WITH GENERATOR REPLACED; BiV ICD  . US echocardiography  03/21/2008    EF 30-35%  . Cardiovascular stress test  03/20/2009    EF 33%  . Transluminal atherectomy tibial artery Left 09/12/2013  . Cardiac catheterization  10/01/2001    THERE WAS GLOBAL HYPOKINESIS AND EF 25%. THERE APPEARED TO BE GLOBAL DECREASE IN WALL MOTION  . Toe amputation  10/04/2013    LEFT GREAT TOE AND 4TH TOE   /   DR Erlinda Hong  . Amputation Left 10/04/2013    Procedure: LEFT GREAT TOE AND SECOND TOE AMPUTATION;  Surgeon: Marianna Payment, MD;  Location: [facility];  Service: Orthopedics;  Laterality: Left;  . Colonoscopy      [place], [place], [place] Pager [phone] After Hours Pager

## 2013-11-10 NOTE — Progress Notes (Signed)
Wife is to bring pt's advanced directive tomorrow.

## 2013-11-10 NOTE — Progress Notes (Signed)
   Subjective:  Patient reports pain as moderate.  C/o some nausea o/n.  Objective:   VITALS:   Filed Vitals:   11/09/13 1823 11/09/13 2144 11/10/13 0121 11/10/13 0524  BP: 118/61 125/56 113/51 122/63  Pulse: 84 88 86 91  Temp: 97.7 F (36.5 C) 98.5 F (36.9 C) 97.9 F (36.6 C) 98.1 F (36.7 C)  TempSrc:  Oral Oral Oral  Resp: 12 18 16 16   Weight:      SpO2: 100% 100% 99% 100%    Neurologically intact Neurovascular intact Incision: dressing C/D/I and no drainage No cellulitis present Compartment soft   Lab Results  Component Value Date   WBC 6.9 11/08/2013   HGB 9.9* 11/08/2013   HCT 30.0* 11/08/2013   MCV 73.2* 11/08/2013   PLT 260 11/08/2013     Assessment/Plan: 1 Day Post-Op   Problem List Items Addressed This Visit   None      Expected postop acute blood loss anemia - will monitor for symptoms Up with PT/OT DVT ppx - SCDs, ambulation, asa, plavix NWB left lower extremity Continue HVAC Pain control Medicine comanaging - appreciate their assistance   Marianna Payment 11/10/2013, 8:05 AM [phone]

## 2013-11-10 NOTE — Consult Note (Signed)
Physical Medicine and Rehabilitation Consult Reason for Consult: Left BKA Referring Physician: Dr.Xu   HPI: [name] [name] is a 68 y.o. right-handed male with history of chronic systolic congestive heart failure, PAF with implantable cardioverter defibrillator. Patient lives with his wife uses a walker and wheelchair prior to admission. Admitted 11/09/2013 with left foot osteomyelitis and nonhealing ulcers. Limb was not felt to be salvageable. 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 Postoperative pain management. Postoperative anemia 8.7. Close monitoring of also relation to right foot with PRAFO boot in place Physical therapy evaluation completed 11/10/2013 with recommendations for physical medicine rehabilitation consult.  Pt states he just took pain medicine, "no Pain"  Review of Systems  Respiratory: Positive for shortness of breath.   Cardiovascular: Positive for leg swelling.  Gastrointestinal:       GERD  All other systems reviewed and are negative.   Past Medical History  Diagnosis Date  . GERD (gastroesophageal reflux disease)   . Obesity   . Cardiomyopathy, nonischemic     a. Cath 2003: mild nonobstructive CAD, EF 25% at that time.  . Hypertension   . Chronic systolic CHF (congestive heart failure)     a. NICM EF 25% dating back to at least 2003.  [name] PAF (paroxysmal atrial fibrillation)     a. Noted on ICD interrogation 2012;  b. coumadin d/c'd 01/2013.  [name] NSVT (nonsustained ventricular tachycardia)     a. Noted on ICD interrogation in 2011.  [name] LBBB (left bundle branch block)   . Kidney stone   . Critical lower limb ischemia   . High cholesterol   . PAD (peripheral artery disease)     a. 08/2013 Periph Angio/PTA: Abd Ao nl, RLE- 3v runoff, PT diff dzs, AT 90p, LLE 2v runoff, PT 100, AT 7m (diamondback ORA/chocolate balloon PTA).  . Type II diabetes mellitus   . Uncontrolled pain, Lt toe 09/21/2013  . Gangrenous toe, Lt toe 09/21/2013  .  CAD (coronary artery disease)     a. Nonobstructive by cath 09/2001.  [name] Automatic implantable cardioverter-defibrillator in situ     a. 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  . Dementia   . Arthritis    Past Surgical History  Procedure Laterality Date  . Lithotripsy  2001  . Cervical spine surgery  1994  . Cardiac defibrillator placement  06/2009    WITH GENERATOR REPLACED; BiV ICD  . US echocardiography  03/21/2008    EF 30-35%  . Cardiovascular stress test  03/20/2009    EF 33%  . Transluminal atherectomy tibial artery Left 09/12/2013  . Cardiac catheterization  10/01/2001    THERE WAS GLOBAL HYPOKINESIS AND EF 25%. THERE APPEARED TO BE GLOBAL DECREASE IN WALL MOTION  . Toe amputation  10/04/2013    LEFT GREAT TOE AND 4TH TOE   /   DR Erlinda Hong  . Amputation Left 10/04/2013    Procedure: LEFT GREAT TOE AND SECOND TOE AMPUTATION;  Surgeon: Marianna Payment, MD;  Location: [facility];  Service: Orthopedics;  Laterality: Left;  . Colonoscopy    . 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 Heart disease Mother   . Hypertension Mother   . Diabetes Mother   . Diabetes Father    Social History:  reports that he has never smoked. He has never used smokeless tobacco. He reports that he does  not drink alcohol or use illicit drugs. Allergies: No Known Allergies Medications Prior to Admission  Medication Sig Dispense Refill  . aspirin EC 81 MG tablet Take 81 mg by mouth daily.      [name] atorvastatin (LIPITOR) 10 MG tablet Take 10 mg by mouth daily.        . carvedilol (COREG) 12.5 MG tablet Take 12.5 mg by mouth 2 (two) times daily with a meal.      . clopidogrel (PLAVIX) 75 MG tablet Take 1 tablet (75 mg total) by mouth daily with breakfast.  30 tablet  6  . doxycycline (VIBRA-TABS) 100 MG tablet Take 100 mg by mouth 2 (two) times daily. Toe wound. Started on 09/02/13      . esomeprazole (NEXIUM) 40 MG capsule Take  40 mg by mouth daily as needed (acid reflux).       . furosemide (LASIX) 40 MG tablet Take 1 tablet (40 mg total) by mouth 2 (two) times daily.  60 tablet  1  . HYDROcodone-acetaminophen (NORCO) 5-325 MG per tablet Take 1-2 tablets by mouth every 6 (six) hours as needed.  90 tablet  0  . insulin detemir (LEVEMIR) 100 UNIT/ML injection Inject 45 Units into the skin at bedtime.      . metFORMIN (GLUCOPHAGE) 500 MG tablet Take 1 tablet (500 mg total) by mouth 2 (two) times daily.      . Multiple Vitamins-Minerals (MULTIVITAMIN PO) Take 1 tablet by mouth daily.      [name] NOVOLOG 100 UNIT/ML injection Inject 4 Units into the skin as needed for high blood sugar.       . omeprazole (PRILOSEC) 20 MG capsule Take 20 mg by mouth daily.       [name] SANTYL ointment Apply 1 application topically every other day as needed (wounds).       . silver sulfADIAZINE (SILVADENE) 1 % cream Apply 1 application topically daily.       [name] spironolactone (ALDACTONE) 25 MG tablet Take 0.5 tablets (12.5 mg total) by mouth daily.  90 tablet  0  . vitamin E (VITAMIN E) 1000 UNIT capsule Take 1,000 Units by mouth daily.      [name] ACCU-CHEK AVIVA PLUS test strip 1 each by Other route daily as needed (blood glucose check).       Lorenda Peck SOFTCLIX LANCETS lancets 1 each by Other route daily as needed (blood glucose check).       . Blood Glucose Monitoring Suppl (ACCU-CHEK AVIVA PLUS) W/DEVICE KIT         Home: Home Living Family/patient expects to be discharged to:: Unsure Living Arrangements: Spouse/significant other Available Help at Discharge: Family Type of Home: House Home Access: Stairs to enter Secretary/administrator of Steps: 2 Entrance Stairs-Rails: None Home Layout: Two level (able to live on main level but full bathroom is on 2nd story) Alternate Level Stairs-Number of Steps: one flight Alternate Level Stairs-Rails: Right Home Equipment: Walker - 2 wheels;Wheelchair - manual Additional Comments: pt's RW is his  brothers. pt will need one of his own  Functional History: Prior Function Comments: pt reported he needed help at times but reports to be as independent as he can be  Functional Status:  Mobility:          ADL:    Cognition: Cognition Overall Cognitive Status: Within Functional Limits for tasks assessed Orientation Level: Oriented X4 Cognition Arousal/Alertness: Awake/alert Behavior During Therapy: WFL for tasks assessed/performed Overall Cognitive Status: Within Functional Limits for tasks  assessed  Blood pressure 122/63, pulse 91, temperature 98.1 F (36.7 C), temperature source Oral, resp. rate 16, weight 80.701 kg (177 lb 14.6 oz), SpO2 100.00%. Physical Exam  Vitals reviewed. Constitutional: He is oriented to person, place, and time.  HENT:  Head: Normocephalic.  Eyes: EOM are normal.  Neck: Normal range of motion. Neck supple. No thyromegaly present.  Cardiovascular: Normal rate and regular rhythm.   Respiratory: Effort normal and breath sounds normal. No respiratory distress.  GI: Soft. Bowel sounds are normal. He exhibits no distension.  Neurological: He is alert and oriented to person, place, and time.  Skin:  BKA site is dressed. Right foot with toe ulceration and blackened heel  5/5 in BUE, 4/5 in RLE Absent sensation R foot  Results for orders placed during the hospital encounter of 11/09/13 (from the past 24 hour(s))  GLUCOSE, CAPILLARY     Status: Abnormal   Collection Time    11/09/13  3:41 PM      Result Value Ref Range   Glucose-Capillary 116 (*) 70 - 99 mg/dL   Comment 1 Notify RN    HEMOGLOBIN A1C     Status: Abnormal   Collection Time    11/09/13  8:53 PM      Result Value Ref Range   Hemoglobin A1C 8.6 (*) <5.7 %   Mean Plasma Glucose 200 (*) <117 mg/dL  GLUCOSE, CAPILLARY     Status: Abnormal   Collection Time    11/09/13  9:48 PM      Result Value Ref Range   Glucose-Capillary 183 (*) 70 - 99 mg/dL  COMPREHENSIVE METABOLIC PANEL      Status: Abnormal   Collection Time    11/10/13  6:24 AM      Result Value Ref Range   Sodium 144  137 - 147 mEq/L   Potassium 3.6 (*) 3.7 - 5.3 mEq/L   Chloride 103  96 - 112 mEq/L   CO2 27  19 - 32 mEq/L   Glucose, Bld 171 (*) 70 - 99 mg/dL   BUN 14  6 - 23 mg/dL   Creatinine, Ser 0.11  0.50 - 1.35 mg/dL   Calcium 8.6  8.4 - 56.7 mg/dL   Total Protein 6.2  6.0 - 8.3 g/dL   Albumin 2.6 (*) 3.5 - 5.2 g/dL   AST 12  0 - 37 U/L   ALT 6  0 - 53 U/L   Alkaline Phosphatase 50  39 - 117 U/L   Total Bilirubin 0.3  0.3 - 1.2 mg/dL   GFR calc non Af Amer 86 (*) >90 mL/min   GFR calc Af Amer >90  >90 mL/min  GLUCOSE, CAPILLARY     Status: Abnormal   Collection Time    11/10/13  6:45 AM      Result Value Ref Range   Glucose-Capillary 179 (*) 70 - 99 mg/dL  CBC     Status: Abnormal   Collection Time    11/10/13 11:05 AM      Result Value Ref Range   WBC 9.7  4.0 - 10.5 K/uL   RBC 3.59 (*) 4.22 - 5.81 MIL/uL   Hemoglobin 8.7 (*) 13.0 - 17.0 g/dL   HCT 16.4 (*) 08.9 - 09.7 %   MCV 74.1 (*) 78.0 - 100.0 fL   MCH 24.2 (*) 26.0 - 34.0 pg   MCHC 32.7  30.0 - 36.0 g/dL   RDW 52.9 (*) 55.3 - 97.1 %   Platelets 250  150 - 400 K/uL  GLUCOSE, CAPILLARY     Status: Abnormal   Collection Time    11/10/13 11:36 AM      Result Value Ref Range   Glucose-Capillary 222 (*) 70 - 99 mg/dL   Comment 1 Notify RN     Comment 2 Documented in Chart     No results found.  Assessment/Plan: Diagnosis: Left BKA POD#1 1. Does the need for close, 24 hr/day medical supervision in concert with the patient's rehab needs make it unreasonable for this patient to be served in a less intensive setting? Yes 2. Co-Morbidities requiring supervision/potential complications: CHF,HTN,PAD,DM with neuropathy 3. Due to bladder management, bowel management, safety, skin/wound care, disease management, medication administration, pain management and patient education, does the patient require 24 hr/day rehab nursing?  Yes 4. Does the patient require coordinated care of a physician, rehab nurse, PT (1-2 hrs/day, 5 days/week) and OT (1-2 hrs/day, 5 days/week) to address physical and functional deficits in the context of the above medical diagnosis(es)? Yes Addressing deficits in the following areas: balance, endurance, locomotion, strength, transferring, bowel/bladder control, bathing, dressing, feeding, grooming, toileting and cognition 5. Can the patient actively participate in an intensive therapy program of at least 3 hrs of therapy per day at least 5 days per week? Yes 6. The potential for patient to make measurable gains while on inpatient rehab is good 7. Anticipated functional outcomes upon discharge from inpatient rehab are Sup/mod I with PT, Sup Mod I with OT, NA with SLP. 8. Estimated rehab length of stay to reach the above functional goals is: 7-10 days 9. Does the patient have adequate social supports to accommodate these discharge functional goals? Potentially 10. Anticipated D/C setting: Home 11. Anticipated post D/C treatments: [facility] therapy 12. Overall Rehab/Functional Prognosis: good  RECOMMENDATIONS: This patient's condition is appropriate for continued rehabilitative care in the following setting: CIR Patient has agreed to participate in recommended program. Yes Note that insurance prior authorization may be required for reimbursement for recommended care.  Comment: Pt appears confused likely from pain meds , may need to reduce dose    11/10/2013

## 2013-11-10 NOTE — Progress Notes (Signed)
Subjective: Episode of vomit and some nausea  Denies SOB; c/o some nausea  Objective: Vital signs in last 24 hours: Temp:  [97.7 F (36.5 C)-98.5 F (36.9 C)] 98.1 F (36.7 C) (03/19 0524) Pulse Rate:  [81-92] 91 (03/19 0524) Resp:  [11-18] 16 (03/19 0524) BP: (113-136)/(51-72) 122/63 mmHg (03/19 0524) SpO2:  [98 %-100 %] 100 % (03/19 0524) Weight:  [80.701 kg (177 lb 14.6 oz)] 80.701 kg (177 lb 14.6 oz) (03/18 1150) Weight change:  Last BM Date: 11/08/13  Intake/Output from previous day: 03/18 0701 - 03/19 0700 In: 1120 [P.O.:120; I.V.:1000] Out: 100 [Blood:100] Intake/Output this shift:    General appearance: alert Resp: clear to auscultation bilaterally Cardio: regular rate and rhythm GI: soft, non-tender; bowel sounds normal; no masses,  no organomegaly  Lab Results:  Recent Labs  11/08/13 0854  WBC 6.9  HGB 9.9*  HCT 30.0*  PLT 260   BMET  Recent Labs  11/08/13 0854  NA 143  K 3.7  CL 102  CO2 25  GLUCOSE 150*  BUN 26*  CREATININE 0.93  CALCIUM 9.4    Studies/Results: No results found.  Medications: I have reviewed the patient's current medications.  Assessment/Plan: Active Problems:  SYSTOLIC HEART FAILURE, CHRONIC  - Currently compensated, IVF- KVO- 2-D echo in June 2014 had shown EF of 20-25% with diffuse hypokinesis grade 1 diastolic dysfunction  - His cardiologist is Dr. Caryl Comes.  - Lasix and spironolactone - continue  ( lasix qd for now- until good po intake then go back to BID )- Continue aspirin statin, beta blocker, Plavix.  HTN (hypertension)  - Continue Coreg, Aldactone  PAD (peripheral artery disease)  - Continue aspirin, statin, restart Plavix  Hyperlipidemia  - Restart statins  PAF (paroxysmal atrial fibrillation)  - Continue Coreg, currently on aspirin and Plavix  - Not on any anticoagulation  Type II or unspecified type diabetes mellitus  - Patient is on 45 units Levemir at bedtime. Depending on how his oral intake -  titrate insulin - 20 unit for now- glucaphage on hold Nausea- could be releated to Morphine- will stop that;  Tramadol for moderate pain; and oxycodone for severe pain. S/p left leg amputation- per ortho. Anemia: cbc in am.   LOS: 1 day   Caprina Wussow 11/10/2013, 7:49 AM

## 2013-11-10 NOTE — Progress Notes (Signed)
Rehab Admissions Coordinator Note:  Patient was screened by [name] for appropriateness for an Inpatient Acute Rehab Consult.  At this time, we are recommending Inpatient Rehab consult.  [name] 11/10/2013, 12:58 PM  I can be reached at [phone].

## 2013-11-10 NOTE — Progress Notes (Signed)
Utilization review completed.  

## 2013-11-11 ENCOUNTER — Encounter (HOSPITAL_COMMUNITY): Payer: Self-pay | Admitting: Orthopaedic Surgery

## 2013-11-11 DIAGNOSIS — D62 Acute posthemorrhagic anemia: Secondary | ICD-10-CM

## 2013-11-11 LAB — COMPREHENSIVE METABOLIC PANEL
ALK PHOS: 52 U/L (ref 39–117)
ALT: 6 U/L (ref 0–53)
AST: 12 U/L (ref 0–37)
Albumin: 2.5 g/dL — ABNORMAL LOW (ref 3.5–5.2)
BUN: 12 mg/dL (ref 6–23)
CO2: 28 mEq/L (ref 19–32)
Calcium: 8.8 mg/dL (ref 8.4–10.5)
Chloride: 103 mEq/L (ref 96–112)
Creatinine, Ser: 0.88 mg/dL (ref 0.50–1.35)
GFR calc Af Amer: >90 mL/min (ref >90)
GFR calc non Af Amer: 85 mL/min — ABNORMAL LOW (ref >90)
Glucose, Bld: 168 mg/dL — ABNORMAL HIGH (ref 70–99)
POTASSIUM: 3.8 meq/L (ref 3.7–5.3)
SODIUM: 142 meq/L (ref 137–147)
TOTAL PROTEIN: 6.4 g/dL (ref 6.0–8.3)
Total Bilirubin: 0.4 mg/dL (ref 0.3–1.2)

## 2013-11-11 LAB — CBC
HEMATOCRIT: 24.4 % — AB (ref 39.0–52.0)
Hemoglobin: 8 g/dL — ABNORMAL LOW (ref 13.0–17.0)
MCH: 24.3 pg — ABNORMAL LOW (ref 26.0–34.0)
MCHC: 32.8 g/dL (ref 30.0–36.0)
MCV: 74.2 fL — AB (ref 78.0–100.0)
Platelets: 224 10*3/uL (ref 150–400)
RBC: 3.29 MIL/uL — ABNORMAL LOW (ref 4.22–5.81)
RDW: 17.5 % — AB (ref 11.5–15.5)
WBC: 8.3 10*3/uL (ref 4.0–10.5)

## 2013-11-11 LAB — GLUCOSE, CAPILLARY
Glucose-Capillary: 137 mg/dL — ABNORMAL HIGH (ref 70–99)
Glucose-Capillary: 250 mg/dL — ABNORMAL HIGH (ref 70–99)
Glucose-Capillary: 207 mg/dL — ABNORMAL HIGH (ref 70–99)
Glucose-Capillary: 347 mg/dL — ABNORMAL HIGH (ref 70–99)

## 2013-11-11 LAB — ABO/RH: ABO/RH(D): O POS

## 2013-11-11 LAB — PREPARE RBC (CROSSMATCH)

## 2013-11-11 MED ORDER — FUROSEMIDE 10 MG/ML IJ SOLN
20.0000 mg | Freq: Once | INTRAMUSCULAR | Status: AC
  Administered 2013-11-11: 20 mg via INTRAVENOUS

## 2013-11-11 MED ORDER — FUROSEMIDE 10 MG/ML IJ SOLN
INTRAMUSCULAR | Status: AC
  Filled 2013-11-11: qty 2

## 2013-11-11 MED ORDER — POLYSACCHARIDE IRON COMPLEX 150 MG PO CAPS
150.0000 mg | ORAL_CAPSULE | Freq: Every day | ORAL | Status: DC
  Administered 2013-11-11 – 2013-11-14 (×4): 150 mg via ORAL
  Filled 2013-11-11 (×6): qty 1

## 2013-11-11 NOTE — Progress Notes (Signed)
Dressing change to R big toe, in between second and third toes. R big toe laterally noted to have some black and yellow tissue today. R big toe noted a small amount of yellow drainage. Area cleaned with normal saline, silvadene and dry dressing applied. R lateral heel black small circular nonstageable area is less visible today. R heel also wrapped in kerlix for protection.  No drainage from that area. Prafo boot reapplied. Pt tolerated dressing well.

## 2013-11-11 NOTE — Progress Notes (Signed)
Subjective: Pt without c/o- no SOB Pain ok Less confused Objective: Vital signs in last 24 hours: Temp:  [98.9 F (37.2 C)-99.7 F (37.6 C)] 98.9 F (37.2 C) (03/20 0429) Pulse Rate:  [90-99] 97 (03/20 0429) Resp:  [16-18] 16 (03/20 0429) BP: (104-122)/(54-65) 122/58 mmHg (03/20 0429) SpO2:  [93 %-97 %] 97 % (03/20 0429) Weight change:  Last BM Date: 11/08/13  Intake/Output from previous day: 03/19 0701 - 03/20 0700 In: 240 [P.O.:240] Out: 600 [Urine:600] Intake/Output this shift:    General appearance: alert Resp: clear to auscultation bilaterally Cardio: regular rate and rhythm GI: soft, non-tender; bowel sounds normal; no masses,  no organomegaly  Lab Results:  Recent Labs  11/10/13 1105 11/11/13 0410  WBC 9.7 8.3  HGB 8.7* 8.0*  HCT 26.6* 24.4*  PLT 250 224   BMET  Recent Labs  11/10/13 0624 11/11/13 0410  NA 144 142  K 3.6* 3.8  CL 103 103  CO2 27 28  GLUCOSE 171* 168*  BUN 14 12  CREATININE 0.86 0.88  CALCIUM 8.6 8.8    Studies/Results: No results found.  Medications: I have reviewed the patient's current medications.  Assessment/Plan: Active Problems:  SYSTOLIC HEART FAILURE, CHRONIC  - Currently compensated, IVF- KVO- 2-D echo in June 2014 had shown EF of 20-25% with diffuse hypokinesis grade 1 diastolic dysfunction  - His cardiologist is Dr. Caryl Comes.  - Lasix  continue ( lasix qd for now- until good po intake then go back to BID )- Continue aspirin statin, beta blocker, Plavix. Aldactone on hold- need to restart in 2-3 days once BP get better. HTN (hypertension)  - Continue Coreg,  PAD (peripheral artery disease)  - Continue aspirin, statin, restart Plavix  Hyperlipidemia  - Restart statins  PAF (paroxysmal atrial fibrillation)  - Continue Coreg, currently on aspirin and Plavix  - Not on any anticoagulation  Type II or unspecified type diabetes mellitus  - Patient is on 45 units Levemir at bedtime. Depending on how his oral intake -  titrate insulin - 20 unit for now- glucaphage on hold  Nausea- could be releated to Morphine- will stop that; Tramadol for moderate pain; and oxycodone for severe pain.  S/p left leg amputation- per ortho.  Low HGB- given cardiac h/o keep HGB above 8.5- transfuse 2 unit PRBC with lasix in between.also iron daily.   LOS: 2 days   Greenleigh Kauth 11/11/2013, 7:31 AM  2

## 2013-11-11 NOTE — Progress Notes (Addendum)
Subjective:  Patient reports no events.  Objective:   VITALS:   Filed Vitals:   11/11/13 0429 11/11/13 1020 11/11/13 1151 11/11/13 1205  BP: 122/58 100/72 103/56 124/60  Pulse: 97 100 102 99  Temp: 98.9 F (37.2 C)  98.6 F (37 C) 98.6 F (37 C)  TempSrc: Oral  Oral Oral  Resp: 16  18 18   Weight:      SpO2: 97%  97% 96%    Dressing c/d/i NAD   Lab Results  Component Value Date   WBC 8.3 11/11/2013   HGB 8.0* 11/11/2013   HCT 24.4* 11/11/2013   MCV 74.2* 11/11/2013   PLT 224 11/11/2013     Assessment/Plan: 2 Days Post-Op   Problem List Items Addressed This Visit   None      Expected postop acute blood loss anemia - will monitor for symptoms - receiving 2 U of pRBCs - appreciate medicine assistance Up with PT/OT DVT ppx - SCDs, ambulation, asa, plavix NWB left lower extremity Continue HVAC Pain control Medicine comanaging - appreciate their assistance CIR placement pending 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

## 2013-11-11 NOTE — Progress Notes (Signed)
Physical Therapy Treatment Patient Details Name: [name] [name] MRN: [id number] DOB: 02/13/1946 Today's Date: 11/11/2013 Time: 3976-7341 PT Time Calculation (min): 29 min  PT Assessment / Plan / Recommendation  History of Present Illness 68 y/o male s/p L transtibial amputation.   PT Comments   Pt progressing slowly towards PT goals as he was able to take a few shuffled steps forward today with rolling walker and Mod assist. He will benefit from skilled PT in CIR to improve independence with functional mobility. Acute PT will continue to follow and work on functional mobility until d/c.  Follow Up Recommendations  CIR     Does the patient have the potential to tolerate intense rehabilitation     Barriers to Discharge        Equipment Recommendations  Rolling walker with 5" wheels    Recommendations for Other Services Rehab consult  Frequency Min 3X/week   Progress towards PT Goals Progress towards PT goals: Progressing toward goals  Plan Current plan remains appropriate    Precautions / Restrictions Precautions Precautions: Fall Restrictions Weight Bearing Restrictions: Yes LLE Weight Bearing: Non weight bearing   Pertinent Vitals/Pain 8/10 pain Nurse notified Pt repositioned in chair for comfort  Blood pressure 100/72 (SpO2 98%) HR 100 - Supine in bed Blood pressure 120/58 (SpO2 100%) HR 114 - Immediately after ambulation while seated   Mobility  Bed Mobility Overal bed mobility: Needs Assistance Bed Mobility: Supine to Sit Supine to sit: Mod assist General bed mobility comments: verbal cues for hand placement and safety. mod A for LLE EOB and trunk support coming to sit. Pt with significant posterior lean, needing frequent cues for fwd weight shift Transfers Overall transfer level: Needs assistance Equipment used: Rolling walker (2 wheeled) Transfers: Sit to/from Stand Sit to Stand: Mod assist General transfer comment: transfers perfromed with a RW. verbal  cues for sequencing and mod A for balance support  Pt with significant posterior lean, needing frequent cues for fwd weight shift and to use UEs for support on RW. Ambulation/Gait Ambulation/Gait assistance: Mod assist Ambulation Distance (Feet): 4 Feet Assistive device: Rolling walker (2 wheeled) Gait Pattern/deviations: Shuffle;Leaning posteriorly;Decreased weight shift to right;Decreased weight shift to left General Gait Details: Pt with significant posterior lean, having great difficulty with concept of using UE's for support to advance right foot forward. Pt eventually decreased posterior lean to Min assist from PT for support, however is unable to lift RLE from floor.    Exercises Amputee Exercises Quad Sets: AROM;Left;10 reps;Supine Hip Extension: AROM;Left;5 reps;Standing Hip Flexion/Marching: AROM;Left;5 reps;Standing Knee Flexion: AROM;Left;10 reps;Seated Knee Extension: AROM;Left;10 reps;Seated Chair Push Up: AROM;5 reps;Seated   PT Diagnosis:    PT Problem List:   PT Treatment Interventions:     PT Goals (current goals can now be found in the care plan section) Acute Rehab PT Goals PT Goal Formulation: With patient Time For Goal Achievement: 11/17/13 Potential to Achieve Goals: Good  Visit Information  Last PT Received On: 11/11/13 Assistance Needed: +1 History of Present Illness: 68 y/o male s/p L transtibial amputation.    Subjective Data  Subjective: Pt ready to get out of bed and into chair   Cognition  Cognition Arousal/Alertness: Awake/alert Behavior During Therapy: WFL for tasks assessed/performed Overall Cognitive Status: Within Functional Limits for tasks assessed    Balance  General Comments General comments (skin integrity, edema, etc.): R foot wrapped in bandages to covers dry cracked skin. Transfered with boot in place  End of Session PT - End  of Session Equipment Utilized During Treatment: Gait belt Activity Tolerance: Patient limited by  fatigue Patient left: in chair;with call bell/phone within reach;with family/visitor present;with nursing/sitter in room Nurse Communication: Mobility status;Patient requests pain meds   GP    [place], [place]   Ellouise Newer 11/11/2013, 10:57 AM

## 2013-11-11 NOTE — Progress Notes (Signed)
CBGs today 137-250 mg/dl.  Only eating 5-15% of meals.  May need to increase correction scale to MODERATE TID. No meal coverage if only eating small amount. Getting meal supplements twice a day.  Will follow.  Harvel Ricks RN BSN CDE

## 2013-11-11 NOTE — H&P (Signed)
Physical Medicine and Rehabilitation Admission H&P    No chief complaint on file. :  Chief complaint: Left stump pain  HPI: [name] [name] is a 68 y.o. right-handed male with history of diabetes mellitus or peripheral neuropathy, chronic systolic congestive heart failure, PAF with implantable cardioverter defibrillator. Patient lives with his wife uses a walker and wheelchair prior to admission. Admitted 11/09/2013 with left foot osteomyelitis and nonhealing ulcers. Limb was not felt to be salvageable. 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 Postoperative pain management. Postoperative anemia 8.0 and transfuse 11/11/2013. Close monitoring right foot wound with right toe ulceration and blackened heel. Bouts of confusion? Chronic induced. Physical and occupational therapy evaluations completed 11/10/2013 with recommendations for physical medicine rehabilitation consult. Patient was admitted for comprehensive rehabilitation program   ROS Review of Systems  Respiratory: Positive for shortness of breath.  Cardiovascular: Positive for leg swelling.  Gastrointestinal:  GERD  All other systems reviewed and are negative  Past Medical History  Diagnosis Date  . GERD (gastroesophageal reflux disease)   . Obesity   . Cardiomyopathy, nonischemic     a. Cath 2003: mild nonobstructive CAD, EF 25% at that time.  . Hypertension   . Chronic systolic CHF (congestive heart failure)     a. NICM EF 25% dating back to at least 2003.  [name] PAF (paroxysmal atrial fibrillation)     a. Noted on ICD interrogation 2012;  b. coumadin d/c'd 01/2013.  [name] NSVT (nonsustained ventricular tachycardia)     a. Noted on ICD interrogation in 2011.  [name] LBBB (left bundle branch block)   . Kidney stone   . Critical lower limb ischemia   . High cholesterol   . PAD (peripheral artery disease)     a. 08/2013 Periph Angio/PTA: Abd Ao nl, RLE- 3v runoff, PT diff dzs, AT 90p, LLE 2v runoff, PT 100, AT 56m  (diamondback ORA/chocolate balloon PTA).  . Type II diabetes mellitus   . Uncontrolled pain, Lt toe 09/21/2013  . Gangrenous toe, Lt toe 09/21/2013  . CAD (coronary artery disease)     a. Nonobstructive by cath 09/2001.  [name] Automatic implantable cardioverter-defibrillator in situ     a. 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  . Dementia   . Arthritis    Past Surgical History  Procedure Laterality Date  . Lithotripsy  2001  . Cervical spine surgery  1994  . Cardiac defibrillator placement  06/2009    WITH GENERATOR REPLACED; BiV ICD  . US echocardiography  03/21/2008    EF 30-35%  . Cardiovascular stress test  03/20/2009    EF 33%  . Transluminal atherectomy tibial artery Left 09/12/2013  . Cardiac catheterization  10/01/2001    THERE WAS GLOBAL HYPOKINESIS AND EF 25%. THERE APPEARED TO BE GLOBAL DECREASE IN WALL MOTION  . Toe amputation  10/04/2013    LEFT GREAT TOE AND 4TH TOE   /   DR Erlinda Hong  . Amputation Left 10/04/2013    Procedure: LEFT GREAT TOE AND SECOND TOE AMPUTATION;  Surgeon: Marianna Payment, MD;  Location: [facility];  Service: Orthopedics;  Laterality: Left;  . Colonoscopy    . Leg amputation below knee Left 11/09/2013    DR Erlinda Hong  . Amputation Left 11/09/2013    Procedure: LEFT AMPUTATION BELOW KNEE;  Surgeon: Marianna Payment, MD;  Location: [place];  Service: Orthopedics;  Laterality: Left;   Family History  Problem Relation Age of Onset  . Heart  disease Mother   . Hypertension Mother   . Diabetes Mother   . Diabetes Father    Social History:  reports that he has never smoked. He has never used smokeless tobacco. He reports that he does not drink alcohol or use illicit drugs. Allergies: No Known Allergies Medications Prior to Admission  Medication Sig Dispense Refill  . aspirin EC 81 MG tablet Take 81 mg by mouth daily.      [name] atorvastatin (LIPITOR) 10 MG tablet Take 10 mg by mouth daily.        . carvedilol (COREG) 12.5 MG tablet Take 12.5 mg  by mouth 2 (two) times daily with a meal.      . clopidogrel (PLAVIX) 75 MG tablet Take 1 tablet (75 mg total) by mouth daily with breakfast.  30 tablet  6  . doxycycline (VIBRA-TABS) 100 MG tablet Take 100 mg by mouth 2 (two) times daily. Toe wound. Started on 09/02/13      . esomeprazole (NEXIUM) 40 MG capsule Take 40 mg by mouth daily as needed (acid reflux).       . furosemide (LASIX) 40 MG tablet Take 1 tablet (40 mg total) by mouth 2 (two) times daily.  60 tablet  1  . HYDROcodone-acetaminophen (NORCO) 5-325 MG per tablet Take 1-2 tablets by mouth every 6 (six) hours as needed.  90 tablet  0  . insulin detemir (LEVEMIR) 100 UNIT/ML injection Inject 45 Units into the skin at bedtime.      . metFORMIN (GLUCOPHAGE) 500 MG tablet Take 1 tablet (500 mg total) by mouth 2 (two) times daily.      . Multiple Vitamins-Minerals (MULTIVITAMIN PO) Take 1 tablet by mouth daily.      [name] NOVOLOG 100 UNIT/ML injection Inject 4 Units into the skin as needed for high blood sugar.       . omeprazole (PRILOSEC) 20 MG capsule Take 20 mg by mouth daily.       [name] SANTYL ointment Apply 1 application topically every other day as needed (wounds).       . silver sulfADIAZINE (SILVADENE) 1 % cream Apply 1 application topically daily.       [name] spironolactone (ALDACTONE) 25 MG tablet Take 0.5 tablets (12.5 mg total) by mouth daily.  90 tablet  0  . vitamin E (VITAMIN E) 1000 UNIT capsule Take 1,000 Units by mouth daily.      [name] ACCU-CHEK AVIVA PLUS test strip 1 each by Other route daily as needed (blood glucose check).       Danny Lawless SOFTCLIX LANCETS lancets 1 each by Other route daily as needed (blood glucose check).       . Blood Glucose Monitoring Suppl (ACCU-CHEK AVIVA PLUS) W/DEVICE KIT         Home: Home Living Family/patient expects to be discharged to:: Unsure Living Arrangements: Spouse/significant other Available Help at Discharge: Family Type of Home: House Home Access: Stairs to enter Engineer, site of Steps: 2 Entrance Stairs-Rails: None Home Layout: Two level (able to live on main level but full bathroom is on 2nd story) Alternate Level Stairs-Number of Steps: one flight Alternate Level Stairs-Rails: Right Home Equipment: Walker - 2 wheels;Wheelchair - manual Additional Comments: pt's RW is his brothers. pt will need one of his own   Functional History: Prior Function Comments: pt reported he needed help at times but reports to be as independent as he can be   Functional Status:  Mobility: min assist for transfers  and short dx gait 8'     Ambulation/Gait Ambulation Distance (Feet): 4 Feet General Gait Details: Pt with significant posterior lean, having great difficulty with concept of using UE's for support to advance right foot forward. Pt eventually decreased posterior lean to Min assist from PT for support, however is unable to lift RLE from floor.    ADL: min to mod assist    Cognition: Cognition Overall Cognitive Status: Within Functional Limits for tasks assessed Orientation Level: Oriented to person;Oriented to place;Oriented to situation Cognition Arousal/Alertness: Awake/alert Behavior During Therapy: WFL for tasks assessed/performed Overall Cognitive Status: Within Functional Limits for tasks assessed    Physical Exam: Blood pressure 124/60, pulse 99, temperature 98.6 F (37 C), temperature source Oral, resp. rate 18, weight 80.701 kg (177 lb 14.6 oz), SpO2 96.00%. Physical Exam Constitutional: He is oriented to person, place, and time. No distress HENT: oral mucosa pink and moist. Poor dentition Head: Normocephalic.  Eyes: EOM are normal.  Neck: Normal range of motion. Neck supple. No thyromegaly present.  Cardiovascular: Normal rate and regular rhythm. No murmurs, rubs, or gallops Respiratory: Effort normal and breath sounds normal. No respiratory distress. No wheezes, rales, or rhonchi GI: Soft. Bowel sounds are normal. He exhibits no  distension. Non-tender  Neurological: He is alert and oriented to person, place, and time. UE 5/5 prox to distal. LLE 3+ HF, KE. RLE near 4/5 HF, KE and ankle. Decreased sensation to LT, PP distally RLE  Skin:  BKA site is dressed with immediate coban/post-op dressing. Right foot with toe ulceration at tip of big toe and between 1st and 2nd toe as well as blackened heel     Results for orders placed during the hospital encounter of 11/09/13 (from the past 48 hour(s))  GLUCOSE, CAPILLARY     Status: Abnormal   Collection Time    11/09/13  3:41 PM      Result Value Ref Range   Glucose-Capillary 116 (*) 70 - 99 mg/dL   Comment 1 Notify RN    HEMOGLOBIN A1C     Status: Abnormal   Collection Time    11/09/13  8:53 PM      Result Value Ref Range   Hemoglobin A1C 8.6 (*) <5.7 %   Comment: (NOTE)                                                                               According to the ADA Clinical Practice Recommendations for 2011, when     HbA1c is used as a screening test:      >=6.5%   Diagnostic of Diabetes Mellitus               (if abnormal result is confirmed)     5.7-6.4%   Increased risk of developing Diabetes Mellitus     References:Diagnosis and Classification of Diabetes Mellitus,Diabetes     ZMOQ,9476,54(YTKPT 1):S62-S69 and Standards of Medical Care in             Diabetes - 2011,Diabetes Care,2011,34 (Suppl 1):S11-S61.   Mean Plasma Glucose 200 (*) <117 mg/dL   Comment: Performed at [place], CAPILLARY     Status: Abnormal  Collection Time    11/09/13  9:48 PM      Result Value Ref Range   Glucose-Capillary 183 (*) 70 - 99 mg/dL  COMPREHENSIVE METABOLIC PANEL     Status: Abnormal   Collection Time    11/10/13  6:24 AM      Result Value Ref Range   Sodium 144  137 - 147 mEq/L   Potassium 3.6 (*) 3.7 - 5.3 mEq/L   Chloride 103  96 - 112 mEq/L   CO2 27  19 - 32 mEq/L   Glucose, Bld 171 (*) 70 - 99 mg/dL   BUN 14  6 - 23 mg/dL   Creatinine,  Ser 0.86  0.50 - 1.35 mg/dL   Calcium 8.6  8.4 - 10.5 mg/dL   Total Protein 6.2  6.0 - 8.3 g/dL   Albumin 2.6 (*) 3.5 - 5.2 g/dL   AST 12  0 - 37 U/L   ALT 6  0 - 53 U/L   Alkaline Phosphatase 50  39 - 117 U/L   Total Bilirubin 0.3  0.3 - 1.2 mg/dL   GFR calc non Af Amer 86 (*) >90 mL/min   GFR calc Af Amer >90  >90 mL/min   Comment: (NOTE)     The eGFR has been calculated using the CKD EPI equation.     This calculation has not been validated in all clinical situations.     eGFR's persistently <90 mL/min signify possible Chronic Kidney     Disease.  GLUCOSE, CAPILLARY     Status: Abnormal   Collection Time    11/10/13  6:45 AM      Result Value Ref Range   Glucose-Capillary 179 (*) 70 - 99 mg/dL  CBC     Status: Abnormal   Collection Time    11/10/13 11:05 AM      Result Value Ref Range   WBC 9.7  4.0 - 10.5 K/uL   RBC 3.59 (*) 4.22 - 5.81 MIL/uL   Hemoglobin 8.7 (*) 13.0 - 17.0 g/dL   HCT 26.6 (*) 39.0 - 52.0 %   MCV 74.1 (*) 78.0 - 100.0 fL   MCH 24.2 (*) 26.0 - 34.0 pg   MCHC 32.7  30.0 - 36.0 g/dL   RDW 17.4 (*) 11.5 - 15.5 %   Platelets 250  150 - 400 K/uL  GLUCOSE, CAPILLARY     Status: Abnormal   Collection Time    11/10/13 11:36 AM      Result Value Ref Range   Glucose-Capillary 222 (*) 70 - 99 mg/dL   Comment 1 Notify RN     Comment 2 Documented in Chart    GLUCOSE, CAPILLARY     Status: Abnormal   Collection Time    11/10/13  4:07 PM      Result Value Ref Range   Glucose-Capillary 260 (*) 70 - 99 mg/dL   Comment 1 Notify RN     Comment 2 Documented in Chart    GLUCOSE, CAPILLARY     Status: Abnormal   Collection Time    11/10/13  9:23 PM      Result Value Ref Range   Glucose-Capillary 201 (*) 70 - 99 mg/dL  COMPREHENSIVE METABOLIC PANEL     Status: Abnormal   Collection Time    11/11/13  4:10 AM      Result Value Ref Range   Sodium 142  137 - 147 mEq/L   Potassium 3.8  3.7 - 5.3 mEq/L  Chloride 103  96 - 112 mEq/L   CO2 28  19 - 32 mEq/L    Glucose, Bld 168 (*) 70 - 99 mg/dL   BUN 12  6 - 23 mg/dL   Creatinine, Ser 0.88  0.50 - 1.35 mg/dL   Calcium 8.8  8.4 - 10.5 mg/dL   Total Protein 6.4  6.0 - 8.3 g/dL   Albumin 2.5 (*) 3.5 - 5.2 g/dL   AST 12  0 - 37 U/L   ALT 6  0 - 53 U/L   Alkaline Phosphatase 52  39 - 117 U/L   Total Bilirubin 0.4  0.3 - 1.2 mg/dL   GFR calc non Af Amer 85 (*) >90 mL/min   GFR calc Af Amer >90  >90 mL/min   Comment: (NOTE)     The eGFR has been calculated using the CKD EPI equation.     This calculation has not been validated in all clinical situations.     eGFR's persistently <90 mL/min signify possible Chronic Kidney     Disease.  CBC     Status: Abnormal   Collection Time    11/11/13  4:10 AM      Result Value Ref Range   WBC 8.3  4.0 - 10.5 K/uL   RBC 3.29 (*) 4.22 - 5.81 MIL/uL   Hemoglobin 8.0 (*) 13.0 - 17.0 g/dL   HCT 24.4 (*) 39.0 - 52.0 %   MCV 74.2 (*) 78.0 - 100.0 fL   MCH 24.3 (*) 26.0 - 34.0 pg   MCHC 32.8  30.0 - 36.0 g/dL   RDW 17.5 (*) 11.5 - 15.5 %   Platelets 224  150 - 400 K/uL  GLUCOSE, CAPILLARY     Status: Abnormal   Collection Time    11/11/13  6:18 AM      Result Value Ref Range   Glucose-Capillary 137 (*) 70 - 99 mg/dL  PREPARE RBC (CROSSMATCH)     Status: None   Collection Time    11/11/13  8:40 AM      Result Value Ref Range   Order Confirmation ORDER PROCESSED BY BLOOD BANK    TYPE AND SCREEN     Status: None   Collection Time    11/11/13  8:40 AM      Result Value Ref Range   ABO/RH(D) O POS     Antibody Screen NEG     Sample Expiration 11/14/2013     Unit Number [id number]     Blood Component Type RED CELLS,LR     Unit division 00     Status of Unit ISSUED     Transfusion Status OK TO TRANSFUSE     Crossmatch Result Compatible     Unit Number [id number]     Blood Component Type RED CELLS,LR     Unit division 00     Status of Unit ALLOCATED     Transfusion Status OK TO TRANSFUSE     Crossmatch Result Compatible    ABO/RH     Status:  None   Collection Time    11/11/13  8:40 AM      Result Value Ref Range   ABO/RH(D) O POS    GLUCOSE, CAPILLARY     Status: Abnormal   Collection Time    11/11/13 11:10 AM      Result Value Ref Range   Glucose-Capillary 250 (*) 70 - 99 mg/dL   Comment 1 Notify RN     Comment 2  Documented in Chart     No results found.  Post Admission Physician Evaluation: 1. Functional deficits secondary  to left BKA. 2. Patient is admitted to receive collaborative, interdisciplinary care between the physiatrist, rehab nursing staff, and therapy team. 3. Patient's level of medical complexity and substantial therapy needs in context of that medical necessity cannot be provided at a lesser intensity of care such as a SNF. 4. Patient has experienced substantial functional loss from his/her baseline which was documented above under the "Functional History" and "Functional Status" headings.  Judging by the patient's diagnosis, physical exam, and functional history, the patient has potential for functional progress which will result in measurable gains while on inpatient rehab.  These gains will be of substantial and practical use upon discharge  in facilitating mobility and self-care at the household level. 5. Physiatrist will provide 24 hour management of medical needs as well as oversight of the therapy plan/treatment and provide guidance as appropriate regarding the interaction of the two. 6. 24 hour rehab nursing will assist with bladder management, bowel management, safety, skin/wound care, disease management, medication administration, pain management and patient education  and help integrate therapy concepts, techniques,education, etc. 7. PT will assess and treat for/with: Lower extremity strength, range of motion, stamina, balance, functional mobility, safety, adaptive techniques and equipment, pre-pros ed. Pain mgt, wound observation.   Goals are: mod I. 8. OT will assess and treat for/with: ADL's,  functional mobility, safety, upper extremity strength, adaptive techniques and equipment, pre-pros ed, pain mgt, ego support.   Goals are: mod I. 9. SLP will assess and treat for/with: n/a.  Goals are: n/a. 10. Case Management and Social Worker will assess and treat for psychological issues and discharge planning. 11. Team conference will be held weekly to assess progress toward goals and to determine barriers to discharge. 12. Patient will receive at least 3 hours of therapy per day at least 5 days per week. 13. ELOS: 7 days       14. Prognosis:  excellent   Medical Problem List and Plan: 1. 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 DVT Prophylaxis/Anticoagulation: SCDs right lower extremity 3. Pain Management: Lyrica 75 mg twice a day, Oxycodone and Robaxin as needed. 4. Neuropsych: This patient is capable of making decisions on his own behalf. 5. Acute blood loss anemia. Continue Niferex tablet daily. Followup CBC 6. Right foot wound. Right toe ulceration with blackened heel. Silvadene twice a day applied to the right toe wounds. Consider adaptive shoe for right foot (didn't see one in the room) 7. Diabetes mellitus with peripheral neuropathy. Hemoglobin A1c 8.6. Glucophage 500 mg twice a day, Levemir 35 units each bedtime and NovoLog 20 units 3 times a day. Check blood sugars a.c. and at bedtime 8. Chronic systolic congestive heart failure. Lasix 40 mg daily. Monitor for any signs of fluid overload 9. PAF with implantable cardioverter defibrillator. Cardiac rate control. Her chest pain or shortness of breath. 10. Hypertension. Coreg 12.5 mg twice a day. Monitor the increased mobility 11. Hyperlipidemia. Lipitor  Meredith Staggers, MD, [facility]  11/11/2013

## 2013-11-11 NOTE — Progress Notes (Signed)
Rehab admissions - I met with pt and explained the purpose of acute inpatient rehab. Questions were answered and informational brochures were given. With pt's permission, I also called and spoke with pt's wife by phone and answered her questions.   Pt/wife are interested in pursuing inpt rehab and would like me to open the case with his [employer]. I will now open the case and keep the pt/family and medical team aware of progress as I await authorization from insurance.  Please call me with any questions. Thanks.  Nanetta Batty, PT Rehabilitation Admissions Coordinator [phone]

## 2013-11-11 NOTE — Progress Notes (Signed)
Attempted to see pt this afternoon.  Pt receiving 2 units of blood and was eating lunch.  Will try back as schedule allows. Jinger Neighbors, [place] [phone]

## 2013-11-11 NOTE — Clinical Documentation Improvement (Signed)
   Please provide greater specificity regarding the patient's nonhealing ischemic ulcers in the progress notes and discharge summary:   - Nonhealing Ischemic Ulcers 2/2 Diabetic Atherosclerotic Peripheral Artery Disease   - Other Cause   - Unable to Clinically Determine    Clinical Information:  - "Nonhealing ischemic ulcers" documented in dictated operative note   - Known history of peripheral artery disease s/p percutaneous intervention 08/2013    Thank You,   Erling Conte ,RN BSN CCDS  Certified Clinical Documentation Specialist:  [phone].[facility]- Health Information Management

## 2013-11-12 LAB — TYPE AND SCREEN
ABO/RH(D): O POS
Antibody Screen: NEGATIVE
UNIT DIVISION: 0
Unit division: 0

## 2013-11-12 LAB — COMPREHENSIVE METABOLIC PANEL
ALT: 9 U/L (ref 0–53)
AST: 14 U/L (ref 0–37)
Albumin: 2.4 g/dL — ABNORMAL LOW (ref 3.5–5.2)
Alkaline Phosphatase: 59 U/L (ref 39–117)
BILIRUBIN TOTAL: 0.4 mg/dL (ref 0.3–1.2)
BUN: 15 mg/dL (ref 6–23)
CHLORIDE: 103 meq/L (ref 96–112)
CO2: 27 mEq/L (ref 19–32)
Calcium: 8.9 mg/dL (ref 8.4–10.5)
Creatinine, Ser: 0.81 mg/dL (ref 0.50–1.35)
GFR calc Af Amer: >90 mL/min (ref >90)
GFR calc non Af Amer: 88 mL/min — ABNORMAL LOW (ref >90)
Glucose, Bld: 187 mg/dL — ABNORMAL HIGH (ref 70–99)
Potassium: 3.6 mEq/L — ABNORMAL LOW (ref 3.7–5.3)
Sodium: 143 mEq/L (ref 137–147)
Total Protein: 6.8 g/dL (ref 6.0–8.3)

## 2013-11-12 LAB — GLUCOSE, CAPILLARY
GLUCOSE-CAPILLARY: 285 mg/dL — AB (ref 70–99)
Glucose-Capillary: 184 mg/dL — ABNORMAL HIGH (ref 70–99)
Glucose-Capillary: 213 mg/dL — ABNORMAL HIGH (ref 70–99)
Glucose-Capillary: 146 mg/dL — ABNORMAL HIGH (ref 70–99)

## 2013-11-12 LAB — CBC
HCT: 30.6 % — ABNORMAL LOW (ref 39.0–52.0)
Hemoglobin: 10.2 g/dL — ABNORMAL LOW (ref 13.0–17.0)
MCH: 24.2 pg — ABNORMAL LOW (ref 26.0–34.0)
MCHC: 33.3 g/dL (ref 30.0–36.0)
MCV: 72.5 fL — ABNORMAL LOW (ref 78.0–100.0)
Platelets: 228 10*3/uL (ref 150–400)
RBC: 4.22 MIL/uL (ref 4.22–5.81)
RDW: 17.9 % — ABNORMAL HIGH (ref 11.5–15.5)
WBC: 8.9 10*3/uL (ref 4.0–10.5)

## 2013-11-12 MED ORDER — INSULIN DETEMIR 100 UNIT/ML ~~LOC~~ SOLN
30.0000 [IU] | Freq: Every day | SUBCUTANEOUS | Status: DC
Start: 2013-11-12 — End: 2013-11-13
  Administered 2013-11-12: 30 [IU] via SUBCUTANEOUS
  Filled 2013-11-12 (×2): qty 0.3

## 2013-11-12 MED ORDER — INSULIN ASPART 100 UNIT/ML ~~LOC~~ SOLN: 0.0000 [IU] | Freq: Every day | SUBCUTANEOUS | Status: DC

## 2013-11-12 MED ORDER — INSULIN ASPART 100 UNIT/ML ~~LOC~~ SOLN
3.0000 [IU] | Freq: Three times a day (TID) | SUBCUTANEOUS | Status: DC
  Administered 2013-11-12 – 2013-11-14 (×8): 3 [IU] via SUBCUTANEOUS

## 2013-11-12 MED ORDER — POTASSIUM CHLORIDE CRYS ER 20 MEQ PO TBCR
20.0000 meq | EXTENDED_RELEASE_TABLET | Freq: Two times a day (BID) | ORAL | Status: DC
  Administered 2013-11-12 – 2013-11-14 (×5): 20 meq via ORAL
  Filled 2013-11-12 (×7): qty 1

## 2013-11-12 MED ORDER — METFORMIN HCL 500 MG PO TABS
500.0000 mg | ORAL_TABLET | Freq: Two times a day (BID) | ORAL | Status: DC
  Administered 2013-11-12 – 2013-11-14 (×4): 500 mg via ORAL
  Filled 2013-11-12 (×8): qty 1

## 2013-11-12 MED ORDER — HYDROCODONE-ACETAMINOPHEN 5-325 MG PO TABS: 1.0000 | ORAL_TABLET | Freq: Four times a day (QID) | ORAL | Status: DC | PRN

## 2013-11-12 MED ORDER — INSULIN ASPART 100 UNIT/ML ~~LOC~~ SOLN
0.0000 [IU] | Freq: Three times a day (TID) | SUBCUTANEOUS | Status: DC
  Administered 2013-11-12: 5 [IU] via SUBCUTANEOUS
  Administered 2013-11-12: 8 [IU] via SUBCUTANEOUS
  Administered 2013-11-12: 2 [IU] via SUBCUTANEOUS
  Administered 2013-11-13: 8 [IU] via SUBCUTANEOUS
  Administered 2013-11-13: 2 [IU] via SUBCUTANEOUS
  Administered 2013-11-13 – 2013-11-14 (×3): 5 [IU] via SUBCUTANEOUS

## 2013-11-12 NOTE — Progress Notes (Signed)
Subjective: Appetite improving.  Not much pain  Objective: Vital signs in last 24 hours: Temp:  [98.6 F (37 C)-99.9 F (37.7 C)] 98.9 F (37.2 C) (03/21 0410) Pulse Rate:  [80-102] 80 (03/21 0410) Resp:  [16-20] 18 (03/21 0410) BP: (100-129)/(50-72) 121/56 mmHg (03/21 0410) SpO2:  [94 %-100 %] 97 % (03/21 0410) Weight change:  Last BM Date: 11/08/13  Intake/Output from previous day: 03/20 0701 - 03/21 0700 In: 1520 [P.O.:600; I.V.:250; Blood:670] Out: 900 [Urine:900] Intake/Output this shift:    General appearance: alert and cooperative Resp: clear to auscultation bilaterally Cardio: regular rate and rhythm, S1, S2 normal, no murmur, click, rub or gallop Extremities: extremities normal, atraumatic, no cyanosis or edema  Lab Results:  Recent Labs  11/11/13 0410 11/12/13 0632  WBC 8.3 8.9  HGB 8.0* 10.2*  HCT 24.4* 30.6*  PLT 224 228   BMET  Recent Labs  11/10/13 0624 11/11/13 0410  NA 144 142  K 3.6* 3.8  CL 103 103  CO2 27 28  GLUCOSE 171* 168*  BUN 14 12  CREATININE 0.86 0.88  CALCIUM 8.6 8.8    Studies/Results: No results found.  Medications: I have reviewed the patient's current medications.  Assessment/Plan: Active Problems:  SYSTOLIC HEART FAILURE, CHRONIC  - Currently compensated, IVF- KVO- 2-D echo in June 2014 had shown EF of 20-25% with diffuse hypokinesis grade 1 diastolic dysfunction  - His cardiologist is Dr. Caryl Comes.  - Lasix continue ( lasix qd for now- until good po intake then go back to BID )- Continue aspirin statin, beta blocker, Plavix. Aldactone on hold- need to restart in 1-2 days, BP still a little soft HTN (hypertension)  - Continue Coreg,  PAD (peripheral artery disease)  - Continue aspirin, statin, restarted Plavix  Hyperlipidemia  - Restarted statins  PAF (paroxysmal atrial fibrillation)  - Continue Coreg, currently on aspirin and Plavix  - Not on any anticoagulation  Type II or unspecified type diabetes mellitus  -  Patient is on 45 units Levemir at bedtime as outpatient. - titrate insulin up as po intake improves - 25 unit for now- increase SSI, restart metformin Nausea-  Morphine stopped; Tramadol for moderate pain; and oxycodone for severe pain.  S/p left leg amputation- per ortho.  Low HGB-transfused 2U PRRBC with appropriate rise in Hgb.    LOS: 3 days   [name] [name] 11/12/2013, 7:45 AM

## 2013-11-12 NOTE — Progress Notes (Signed)
Patient sitting in chair at bedside with family in room. Patient had scooted to front edge of chair seat, rle extended off reclined edge and lle stump on lifted portion. Safety and security discussed with patient. Patient states he just wanted to get comfortable. 2 persons required to pull trunck of body back into appropriate sitting position. Patient and family asked to please call nursing staff for any further position changes which could jeopardize safety. Both patient and family declare understanding.

## 2013-11-12 NOTE — Progress Notes (Signed)
Subjective:  Patient reports no events.    Objective:   VITALS:   Filed Vitals:   11/11/13 1800 11/11/13 2123 11/12/13 0039 11/12/13 0410  BP: 117/57 111/55 114/50 121/56  Pulse: 100 91 88 80  Temp: 98.6 F (37 C) 99.5 F (37.5 C) 99 F (37.2 C) 98.9 F (37.2 C)  TempSrc: Oral Oral Oral Oral  Resp: 20 18 18 18   Weight:      SpO2: 96% 100% 99% 97%    Dressing c/d/i NAD   Lab Results  Component Value Date   WBC 8.9 11/12/2013   HGB 10.2* 11/12/2013   HCT 30.6* 11/12/2013   MCV 72.5* 11/12/2013   PLT 228 11/12/2013     Assessment/Plan: 3 Days Post-Op   Problem List Items Addressed This Visit     Musculoskeletal and Integument   Chronic osteomyelitis of toe of left foot - Primary   Relevant Medications      ceFAZolin (ANCEF) IVPB 2 g/50 mL premix (Completed)   Other Relevant Orders      Non weight bearing      Expected postop acute blood loss anemia - will monitor for symptoms - responded well to transfusion Up with PT/OT DVT ppx - SCDs, ambulation, asa, plavix NWB left lower extremity Pain control Medicine comanaging - appreciate their assistance CIR placement pending Nonhealing ulcers of leg of 2/2 diabetic atherosclerosis Stressed the importance of maintaining full knee extension   Marianna Payment 11/12/2013, 8:55 AM [phone]

## 2013-11-12 NOTE — Discharge Instructions (Signed)
Needs to keep knee straight at all times to prevent flexion contracture Elevate operative extremity Strict non weight bearing to left amputation stump

## 2013-11-12 NOTE — Evaluation (Signed)
Occupational Therapy Evaluation Patient Details Name: [name] [name] MRN: [id number] DOB: 11/02/1945 Today's Date: 11/12/2013 Time: 2353-6144 OT Time Calculation (min): 43 min  OT Assessment / Plan / Recommendation History of present illness 68 y/o male s/p L BKA and R LE wounds.     Clinical Impression   Pt presents with generalized weakness requiring moderate assist for bed mobility and impaired sitting and standing balance interfering with ability to perform ADL.  Pt requires +2 assist for stand pivot transfer to chair due to increased posterior lean. Pt appears to have some cognitive impairment as well with slow processing, decreased initiation, problem solving and memory. He will need rehab prior to return home.    OT Assessment  Patient needs continued OT Services    Follow Up Recommendations  CIR;Supervision/Assistance - 24 hour    Barriers to Discharge      Equipment Recommendations  3 in 1 bedside comode    Recommendations for Other Services Rehab consult  Frequency  Min 2X/week    Precautions / Restrictions Precautions Precautions: Fall Restrictions Weight Bearing Restrictions: No   Pertinent Vitals/Pain L LE, did not rate, declined meds, positioned on pillow.    ADL  Eating/Feeding: Independent Where Assessed - Eating/Feeding: Chair Grooming: Wash/dry hands;Wash/dry face;Supervision/safety Where Assessed - Grooming: Unsupported sitting Upper Body Bathing: Minimal assistance Where Assessed - Upper Body Bathing: Unsupported sitting Lower Body Bathing: +2 Total assistance Lower Body Bathing: Patient Percentage: 0% Where Assessed - Lower Body Bathing: Unsupported sitting;Supported standing Upper Body Dressing: Set up Where Assessed - Upper Body Dressing: Supported sitting Lower Body Dressing: +2 Total assistance Lower Body Dressing: Patient Percentage: 0% Where Assessed - Lower Body Dressing: Unsupported sitting;Supported standing Toilet Transfer: +2 Total  assistance;Moderate assistance Toilet Transfer Method: Stand pivot Science writer:  (bed to chair) Equipment Used: Gait belt;Rolling walker Transfers/Ambulation Related to ADLs: unable to ambulate, pivoted from bed to chair, bed height elevated    OT Diagnosis: Generalized weakness;Cognitive deficits;Acute pain  OT Problem List: Decreased strength;Impaired balance (sitting and/or standing);Decreased cognition;Impaired vision/perception;Decreased safety awareness;Decreased knowledge of use of DME or AE;Impaired UE functional use;Pain OT Treatment Interventions: Self-care/ADL training;Therapeutic exercise;DME and/or AE instruction;Therapeutic activities;Cognitive remediation/compensation;Patient/family education;Balance training   OT Goals(Current goals can be found in the care plan section) Acute Rehab OT Goals Patient Stated Goal: go to rehab then home OT Goal Formulation: With patient Time For Goal Achievement: 11/26/13 Potential to Achieve Goals: Good ADL Goals Pt Will Transfer to Toilet: with min assist;bedside commode;stand pivot transfer Pt/caregiver will Perform Home Exercise Program: Increased strength;Both right and left upper extremity;With theraband;With written HEP provided;Independently (level 3) Additional ADL Goal #1: Pt will perform supine to sit with supervision from flat surface. Additional ADL Goal #2: Pt will sit unsupported x 10 minutes independently while engaged in ADL involving UE use.  Visit Information  Last OT Received On: 11/12/13 Assistance Needed: +2 History of Present Illness: 68 y/o male s/p L BKA and R LE wounds.         Prior [place]ples expects to be discharged to:: Unsure Living Arrangements: Spouse/significant other Available Help at Discharge: Family Type of Home: House Home Access: Stairs to enter [employer] of Steps: 2 Entrance Stairs-Rails: None Home Layout: Two level;Able to live on  main level with bedroom/bathroom Alternate Level Stairs-Number of Steps: one flight Alternate Level Stairs-Rails: Right Home Equipment: Walker - 2 wheels;Wheelchair - manual Additional Comments: pt's RW is his brothers. pt will need one of  his own Prior Function Level of Independence: Needs assistance Gait / Transfers Assistance Needed: amb with RW PRN  ADL's / Homemaking Assistance Needed: wife A with bathing back side, dressing as needed, and cooking Comments: pt reported he needed help at times but reports to be as independent as he can be  Communication Communication: No difficulties Dominant Hand: Right         Vision/Perception Vision - History Patient Visual Report: No change from baseline Vision - Assessment Additional Comments: pt states his vision was good, but unable to read paper or see writing on TV   Cognition  Cognition Arousal/Alertness: Awake/alert Behavior During Therapy: [facility] for tasks assessed/performed Overall Cognitive Status: No family/caregiver present to determine baseline cognitive functioning Area of Impairment: Safety/judgement;Awareness;Problem solving Awareness: Emergent Problem Solving: Slow processing;Decreased initiation General Comments: Pt sitting in wet diaper with very full urinal, did not call for help.      Extremity/Trunk Assessment Upper Extremity Assessment Upper Extremity Assessment: Generalized weakness Lower Extremity Assessment Lower Extremity Assessment: Defer to PT evaluation LLE Deficits / Details: s/p Lt BKA Cervical / Trunk Assessment Cervical / Trunk Assessment: Normal     Mobility Bed Mobility Overal bed mobility: Needs Assistance Bed Mobility: Supine to Sit Supine to sit: Mod assist General bed mobility comments: physical assist with pad and to bring up trunk Transfers Overall transfer level: Needs assistance Equipment used: Rolling walker (2 wheeled) Transfers: Stand Pivot Transfers Sit to Stand: +2 physical  assistance;Mod assist Stand pivot transfers: +2 physical assistance;Mod assist General transfer comment: significant posterior lean, verbal cues for hand placement and to pivot on R foot     Exercise     Balance Balance Overall balance assessment: Needs assistance Sitting-balance support: No upper extremity supported Sitting balance-Leahy Scale: Poor Postural control: Posterior lean Standing balance support: Bilateral upper extremity supported Standing balance-Leahy Scale: Poor   End of Session OT - End of Session Activity Tolerance: Patient tolerated treatment well Patient left: in chair;with call bell/phone within reach Nurse Communication: Mobility status  GO     Malka So 11/12/2013, 3:51 PM [phone]

## 2013-11-13 LAB — GLUCOSE, CAPILLARY
GLUCOSE-CAPILLARY: 290 mg/dL — AB (ref 70–99)
GLUCOSE-CAPILLARY: 181 mg/dL — AB (ref 70–99)
Glucose-Capillary: 171 mg/dL — ABNORMAL HIGH (ref 70–99)
Glucose-Capillary: 250 mg/dL — ABNORMAL HIGH (ref 70–99)

## 2013-11-13 MED ORDER — POTASSIUM CHLORIDE CRYS ER 20 MEQ PO TBCR
40.0000 meq | EXTENDED_RELEASE_TABLET | Freq: Once | ORAL | Status: AC
  Administered 2013-11-13: 40 meq via ORAL
  Filled 2013-11-13: qty 2

## 2013-11-13 MED ORDER — HYDROCODONE-ACETAMINOPHEN 5-325 MG PO TABS: 1.0000 | ORAL_TABLET | Freq: Four times a day (QID) | ORAL | Status: DC | PRN

## 2013-11-13 MED ORDER — INSULIN DETEMIR 100 UNIT/ML ~~LOC~~ SOLN
35.0000 [IU] | Freq: Every day | SUBCUTANEOUS | Status: DC
  Administered 2013-11-13: 35 [IU] via SUBCUTANEOUS
  Filled 2013-11-13 (×2): qty 0.35

## 2013-11-13 NOTE — Progress Notes (Signed)
Subjective: Appetite improving  Objective: Vital signs in last 24 hours: Temp:  [99.5 F (37.5 C)-100.1 F (37.8 C)] 99.6 F (37.6 C) (03/22 0545) Pulse Rate:  [92-99] 92 (03/22 0545) Resp:  [16-18] 18 (03/22 0545) BP: (103-119)/(58-63) 115/63 mmHg (03/22 0545) SpO2:  [95 %-97 %] 97 % (03/22 0545) Weight change:  Last BM Date: 11/08/13  Intake/Output from previous day: 03/21 0701 - 03/22 0700 In: 240 [P.O.:240] Out: 700 [Urine:700] Intake/Output this shift:    General appearance: alert and cooperative Resp: clear to auscultation bilaterally Cardio: regular rate and rhythm, S1, S2 normal, no murmur, click, rub or gallop Extremities: extremities normal, atraumatic, no cyanosis or edema  Lab Results:  Recent Labs  11/11/13 0410 11/12/13 0632  WBC 8.3 8.9  HGB 8.0* 10.2*  HCT 24.4* 30.6*  PLT 224 228   BMET  Recent Labs  11/11/13 0410 11/12/13 0632  NA 142 143  K 3.8 3.6*  CL 103 103  CO2 28 27  GLUCOSE 168* 187*  BUN 12 15  CREATININE 0.88 0.81  CALCIUM 8.8 8.9    Studies/Results: No results found.  Medications: I have reviewed the patient's current medications.  Assessment/Plan: Active Problems:  SYSTOLIC [name] FAILURE, CHRONIC  - Currently compensated, IVF- KVO- 2-D echo in June 2014 had shown EF of 20-25% with diffuse hypokinesis grade 1 diastolic dysfunction  - His cardiologist is Dr. Caryl Comes.  - Lasix continue ( lasix qd for now- until good po intake then go back to BID )- Continue aspirin statin, beta blocker, Plavix. Aldactone on hold- need to restart in 1-2 days, BP still a little soft  HTN (hypertension)  - Continue Coreg,  PAD (peripheral artery disease)  - Continue aspirin, statin, restarted Plavix  Hyperlipidemia  - Restarted statins  PAF (paroxysmal atrial fibrillation)  - Continue Coreg, currently on aspirin and Plavix  - Not on any anticoagulation  Type II or unspecified type diabetes mellitus reasonable control - Patient is on 45  units Levemir at bedtime as outpatient. - titrate insulin up as po intake improves - 35 unit for now- increased SSI, restaredt metformin  Nausea- Morphine stopped; Tramadol for moderate pain; and oxycodone for severe pain.  S/p left leg amputation- per ortho.  Low HGB-transfused 2U PRRBC with appropriate rise in Hgb. Disposition  CIR     LOS: 4 days   [name] [name] JOSEPH 11/13/2013, 9:25 AM

## 2013-11-13 NOTE — Progress Notes (Signed)
Clinical Social Work Department CLINICAL SOCIAL WORK PLACEMENT NOTE 11/13/2013  Patient:  ESTEN, DOLLAR  Account Number:  [id number] Admit date:  11/09/2013  Clinical Social Worker:  Blima Rich, Latanya Presser  Date/time:  11/13/2013 05:01 PM  Clinical Social Work is seeking post-discharge placement for this patient at the following level of care:   SKILLED NURSING   (*CSW will update this form in Epic as items are completed)   11/13/2013  Patient/family provided with [place] Department of Clinical Social Work's list of facilities offering this level of care within the geographic area requested by the patient (or if unable, by the patient's family).  11/13/2013  Patient/family informed of their freedom to choose among providers that offer the needed level of care, that participate in Medicare, Medicaid or managed care program needed by the patient, have an available bed and are willing to accept the patient.  11/13/2013  Patient/family informed of [facility]' ownership interest in [facility], as well as of the fact that they are under no obligation to receive care at this facility.  PASARR submitted to [employer] on 11/13/2013 PASARR number received from [employer] on 11/13/2013  FL2 transmitted to all facilities in geographic area requested by pt/family on  11/13/2013 FL2 transmitted to all facilities within larger geographic area on   Patient informed that his/her managed care company has contracts with or will negotiate with  certain facilities, including the following:     Patient/family informed of bed offers received:   Patient chooses bed at  Physician recommends and patient chooses bed at    Patient to be transferred to  on   Patient to be transferred to facility by   The following physician request were entered in Epic:   Additional Comments:

## 2013-11-13 NOTE — Progress Notes (Signed)
Clinical Social Work Department BRIEF PSYCHOSOCIAL ASSESSMENT 11/13/2013  Patient:  [name] [name], [name] [name]     Account Number:  [id number]     Admit date:  11/09/2013  Clinical Social Worker:  Rolinda Roan  Date/Time:  11/13/2013 04:57 PM  Referred by:  Physician  Date Referred:  11/12/2013 Referred for  SNF Placement   Other Referral:   Interview type:  Patient Other interview type:    PSYCHOSOCIAL DATA Living Status:  WIFE Admitted from facility:   Level of care:   Primary support name:  Shlomie Romig Primary support relationship to patient:  SPOUSE Degree of support available:   Good support per patient.    CURRENT CONCERNS  Other Concerns:    SOCIAL WORK ASSESSMENT / PLAN Clinical Social Worker (CSW) met with patient to discuss SNF placement. Patient reported that he lives with his wife Enid Derry, daughter Larene Beach and her children in [place]. Patient is agreeable to SNF search as back up to CIR. Patient does not have a preference of facilities at this time.   Assessment/plan status:  Psychosocial Support/Ongoing Assessment of Needs Other assessment/ plan:   Information/referral to community resources:   CSW gave patient SNF list.    PATIENT'S/FAMILY'S RESPONSE TO PLAN OF CARE: Patient thanked CSW for visit.

## 2013-11-13 NOTE — Progress Notes (Signed)
Subjective:  Patient reports no events.    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

## 2013-11-14 ENCOUNTER — Inpatient Hospital Stay (HOSPITAL_COMMUNITY)
Admission: RE | Admit: 2013-11-14 | Discharge: 2013-11-28 | DRG: 945 | Disposition: A | Discharge status: Home Health | Payer: Medicare Other | Source: Intra-hospital | Attending: Physical Medicine & Rehabilitation | Admitting: Physical Medicine & Rehabilitation

## 2013-11-14 DIAGNOSIS — G609 Hereditary and idiopathic neuropathy, unspecified: Secondary | ICD-10-CM | POA: Diagnosis present

## 2013-11-14 DIAGNOSIS — E119 Type 2 diabetes mellitus without complications: Secondary | ICD-10-CM

## 2013-11-14 DIAGNOSIS — M86672 Other chronic osteomyelitis, left ankle and foot: Secondary | ICD-10-CM

## 2013-11-14 DIAGNOSIS — L8991 Pressure ulcer of unspecified site, stage 1: Secondary | ICD-10-CM | POA: Diagnosis not present

## 2013-11-14 DIAGNOSIS — E1165 Type 2 diabetes mellitus with hyperglycemia: Secondary | ICD-10-CM

## 2013-11-14 DIAGNOSIS — E785 Hyperlipidemia, unspecified: Secondary | ICD-10-CM

## 2013-11-14 DIAGNOSIS — K219 Gastro-esophageal reflux disease without esophagitis: Secondary | ICD-10-CM | POA: Diagnosis present

## 2013-11-14 DIAGNOSIS — I4891 Unspecified atrial fibrillation: Secondary | ICD-10-CM | POA: Diagnosis present

## 2013-11-14 DIAGNOSIS — E1149 Type 2 diabetes mellitus with other diabetic neurological complication: Secondary | ICD-10-CM | POA: Diagnosis present

## 2013-11-14 DIAGNOSIS — I5022 Chronic systolic (congestive) heart failure: Secondary | ICD-10-CM

## 2013-11-14 DIAGNOSIS — L89109 Pressure ulcer of unspecified part of back, unspecified stage: Secondary | ICD-10-CM | POA: Diagnosis not present

## 2013-11-14 DIAGNOSIS — IMO0001 Reserved for inherently not codable concepts without codable children: Secondary | ICD-10-CM

## 2013-11-14 DIAGNOSIS — Z89519 Acquired absence of unspecified leg below knee: Secondary | ICD-10-CM

## 2013-11-14 DIAGNOSIS — S88119A Complete traumatic amputation at level between knee and ankle, unspecified lower leg, initial encounter: Secondary | ICD-10-CM

## 2013-11-14 DIAGNOSIS — Z5189 Encounter for other specified aftercare: Principal | ICD-10-CM

## 2013-11-14 DIAGNOSIS — L97509 Non-pressure chronic ulcer of other part of unspecified foot with unspecified severity: Secondary | ICD-10-CM | POA: Diagnosis present

## 2013-11-14 DIAGNOSIS — I509 Heart failure, unspecified: Secondary | ICD-10-CM | POA: Diagnosis present

## 2013-11-14 DIAGNOSIS — E1142 Type 2 diabetes mellitus with diabetic polyneuropathy: Secondary | ICD-10-CM | POA: Diagnosis present

## 2013-11-14 DIAGNOSIS — E669 Obesity, unspecified: Secondary | ICD-10-CM | POA: Diagnosis present

## 2013-11-14 DIAGNOSIS — I10 Essential (primary) hypertension: Secondary | ICD-10-CM

## 2013-11-14 DIAGNOSIS — D62 Acute posthemorrhagic anemia: Secondary | ICD-10-CM | POA: Diagnosis present

## 2013-11-14 DIAGNOSIS — M869 Osteomyelitis, unspecified: Secondary | ICD-10-CM | POA: Diagnosis present

## 2013-11-14 DIAGNOSIS — I739 Peripheral vascular disease, unspecified: Secondary | ICD-10-CM

## 2013-11-14 DIAGNOSIS — L98499 Non-pressure chronic ulcer of skin of other sites with unspecified severity: Secondary | ICD-10-CM

## 2013-11-14 LAB — BASIC METABOLIC PANEL
BUN: 24 mg/dL — ABNORMAL HIGH (ref 6–23)
CHLORIDE: 102 meq/L (ref 96–112)
CO2: 27 meq/L (ref 19–32)
Calcium: 9.4 mg/dL (ref 8.4–10.5)
Creatinine, Ser: 0.94 mg/dL (ref 0.50–1.35)
GFR calc non Af Amer: 83 mL/min — ABNORMAL LOW (ref >90)
Glucose, Bld: 217 mg/dL — ABNORMAL HIGH (ref 70–99)
POTASSIUM: 4.8 meq/L (ref 3.7–5.3)
SODIUM: 141 meq/L (ref 137–147)

## 2013-11-14 LAB — GLUCOSE, CAPILLARY
GLUCOSE-CAPILLARY: 202 mg/dL — AB (ref 70–99)
GLUCOSE-CAPILLARY: 181 mg/dL — AB (ref 70–99)
Glucose-Capillary: 226 mg/dL — ABNORMAL HIGH (ref 70–99)
Glucose-Capillary: 179 mg/dL — ABNORMAL HIGH (ref 70–99)

## 2013-11-14 MED ORDER — CARVEDILOL 12.5 MG PO TABS
12.5000 mg | ORAL_TABLET | Freq: Two times a day (BID) | ORAL | Status: DC
  Administered 2013-11-14 – 2013-11-28 (×28): 12.5 mg via ORAL
  Filled 2013-11-14 (×30): qty 1

## 2013-11-14 MED ORDER — ASPIRIN EC 325 MG PO TBEC
325.0000 mg | DELAYED_RELEASE_TABLET | Freq: Every day | ORAL | Status: DC
  Administered 2013-11-15 – 2013-11-28 (×14): 325 mg via ORAL
  Filled 2013-11-14 (×15): qty 1

## 2013-11-14 MED ORDER — POLYETHYLENE GLYCOL 3350 17 G PO PACK
17.0000 g | PACK | Freq: Every day | ORAL | Status: DC | PRN
  Administered 2013-11-16: 17 g via ORAL
  Filled 2013-11-14: qty 1

## 2013-11-14 MED ORDER — SENNA 8.6 MG PO TABS
1.0000 | ORAL_TABLET | Freq: Two times a day (BID) | ORAL | Status: DC
  Administered 2013-11-14 – 2013-11-17 (×6): 8.6 mg via ORAL
  Filled 2013-11-14 (×8): qty 1

## 2013-11-14 MED ORDER — INSULIN DETEMIR 100 UNIT/ML ~~LOC~~ SOLN
35.0000 [IU] | Freq: Every day | SUBCUTANEOUS | Status: DC
  Administered 2013-11-14 – 2013-11-24 (×11): 35 [IU] via SUBCUTANEOUS
  Filled 2013-11-14 (×11): qty 0.35

## 2013-11-14 MED ORDER — SILVER SULFADIAZINE 1 % EX CREA
1.0000 "application " | TOPICAL_CREAM | Freq: Two times a day (BID) | CUTANEOUS | Status: DC
  Administered 2013-11-14 – 2013-11-28 (×28): 1 via TOPICAL
  Filled 2013-11-14: qty 85

## 2013-11-14 MED ORDER — POTASSIUM CHLORIDE CRYS ER 20 MEQ PO TBCR
20.0000 meq | EXTENDED_RELEASE_TABLET | Freq: Two times a day (BID) | ORAL | Status: DC
  Administered 2013-11-14 – 2013-11-28 (×28): 20 meq via ORAL
  Filled 2013-11-14 (×31): qty 1

## 2013-11-14 MED ORDER — METHOCARBAMOL 500 MG PO TABS: 500.0000 mg | ORAL_TABLET | Freq: Four times a day (QID) | ORAL | Status: DC | PRN

## 2013-11-14 MED ORDER — PANTOPRAZOLE SODIUM 40 MG PO TBEC
80.0000 mg | DELAYED_RELEASE_TABLET | Freq: Every day | ORAL | Status: DC
  Administered 2013-11-15 – 2013-11-28 (×14): 80 mg via ORAL
  Filled 2013-11-14 (×18): qty 2

## 2013-11-14 MED ORDER — INSULIN ASPART 100 UNIT/ML ~~LOC~~ SOLN
0.0000 [IU] | Freq: Three times a day (TID) | SUBCUTANEOUS | Status: DC
  Administered 2013-11-14: 3 [IU] via SUBCUTANEOUS
  Administered 2013-11-15 (×3): 2 [IU] via SUBCUTANEOUS
  Administered 2013-11-16 – 2013-11-18 (×4): 3 [IU] via SUBCUTANEOUS
  Administered 2013-11-19 (×2): 2 [IU] via SUBCUTANEOUS
  Administered 2013-11-19: 3 [IU] via SUBCUTANEOUS
  Administered 2013-11-20: 2 [IU] via SUBCUTANEOUS
  Administered 2013-11-20: 3 [IU] via SUBCUTANEOUS
  Administered 2013-11-21: 2 [IU] via SUBCUTANEOUS
  Administered 2013-11-21: 3 [IU] via SUBCUTANEOUS
  Administered 2013-11-22 – 2013-11-24 (×4): 2 [IU] via SUBCUTANEOUS
  Administered 2013-11-24: 3 [IU] via SUBCUTANEOUS
  Administered 2013-11-26: 2 [IU] via SUBCUTANEOUS
  Administered 2013-11-27: 3 [IU] via SUBCUTANEOUS

## 2013-11-14 MED ORDER — ACETAMINOPHEN 325 MG PO TABS: 325.0000 mg | ORAL_TABLET | ORAL | Status: DC | PRN

## 2013-11-14 MED ORDER — FUROSEMIDE 40 MG PO TABS
40.0000 mg | ORAL_TABLET | Freq: Every day | ORAL | Status: DC
  Administered 2013-11-15 – 2013-11-28 (×14): 40 mg via ORAL
  Filled 2013-11-14 (×15): qty 1

## 2013-11-14 MED ORDER — OXYCODONE HCL 5 MG PO TABS: 5.0000 mg | ORAL_TABLET | ORAL | Status: DC | PRN

## 2013-11-14 MED ORDER — METFORMIN HCL 500 MG PO TABS
500.0000 mg | ORAL_TABLET | Freq: Two times a day (BID) | ORAL | Status: DC
  Administered 2013-11-14 – 2013-11-28 (×28): 500 mg via ORAL
  Filled 2013-11-14 (×30): qty 1

## 2013-11-14 MED ORDER — JUVEN PO PACK
1.0000 | PACK | Freq: Two times a day (BID) | ORAL | Status: DC
  Administered 2013-11-15 – 2013-11-22 (×15): 1 via ORAL
  Administered 2013-11-23: 09:00:00 via ORAL
  Administered 2013-11-23 – 2013-11-24 (×2): 1 via ORAL
  Filled 2013-11-14 (×20): qty 1

## 2013-11-14 MED ORDER — PREGABALIN 75 MG PO CAPS
75.0000 mg | ORAL_CAPSULE | Freq: Two times a day (BID) | ORAL | Status: DC
  Administered 2013-11-14 – 2013-11-28 (×28): 75 mg via ORAL
  Filled 2013-11-14 (×6): qty 1
  Filled 2013-11-14: qty 3
  Filled 2013-11-14 (×22): qty 1

## 2013-11-14 MED ORDER — CLOPIDOGREL BISULFATE 75 MG PO TABS
75.0000 mg | ORAL_TABLET | Freq: Every day | ORAL | Status: DC
  Administered 2013-11-15 – 2013-11-28 (×14): 75 mg via ORAL
  Filled 2013-11-14 (×15): qty 1

## 2013-11-14 MED ORDER — INSULIN ASPART 100 UNIT/ML ~~LOC~~ SOLN
3.0000 [IU] | Freq: Three times a day (TID) | SUBCUTANEOUS | Status: DC
  Administered 2013-11-14 – 2013-11-28 (×39): 3 [IU] via SUBCUTANEOUS

## 2013-11-14 MED ORDER — POLYSACCHARIDE IRON COMPLEX 150 MG PO CAPS
150.0000 mg | ORAL_CAPSULE | Freq: Every day | ORAL | Status: DC
  Administered 2013-11-15 – 2013-11-28 (×14): 150 mg via ORAL
  Filled 2013-11-14 (×15): qty 1

## 2013-11-14 MED ORDER — ADULT MULTIVITAMIN W/MINERALS CH
1.0000 | ORAL_TABLET | Freq: Every day | ORAL | Status: DC
  Administered 2013-11-15 – 2013-11-28 (×14): 1 via ORAL
  Filled 2013-11-14 (×15): qty 1

## 2013-11-14 MED ORDER — ONDANSETRON HCL 4 MG/2ML IJ SOLN: 4.0000 mg | Freq: Four times a day (QID) | INTRAMUSCULAR | Status: DC | PRN

## 2013-11-14 MED ORDER — ONDANSETRON HCL 4 MG PO TABS: 4.0000 mg | ORAL_TABLET | Freq: Four times a day (QID) | ORAL | Status: DC | PRN

## 2013-11-14 MED ORDER — ATORVASTATIN CALCIUM 10 MG PO TABS
10.0000 mg | ORAL_TABLET | Freq: Every day | ORAL | Status: DC
  Administered 2013-11-14 – 2013-11-27 (×14): 10 mg via ORAL
  Filled 2013-11-14 (×15): qty 1

## 2013-11-14 NOTE — Plan of Care (Signed)
Problem: RH PAIN MANAGEMENT Goal: RH STG PAIN MANAGED AT OR BELOW PT'S PAIN GOAL <3 on a 0-10 scale  Outcome: Progressing No pain this shift

## 2013-11-14 NOTE — PMR Pre-admission (Signed)
PMR Admission Coordinator Pre-Admission Assessment  [name] is an 68 y.o., male MRN: [id number] DOB: 1945-12-25 Height:   Weight: 80.701 kg (177 lb 14.6 oz)              Insurance Information HMO: yes    PPO:      PCP:      IPA:      80/20:      OTHER:  PRIMARY: AARP Medicare Complete Plan 1      Policy#: [id number]       Subscriber: self CM Name: Apolonio Schneiders      Phone#: [phone], ext [phone]     Fax#: [phone] Pre-Cert#: [id number]      Employer: retired Note: follow up will be with onsite reviewer Archie Patten at [phone]  Benefits:  Phone #: [phone]     Name: Garlon Hatchet. Date: 08-25-13     Deduct: none      Out of Pocket Max: $4900 (all has been met)      Life Max: none CIR: 100%   (pre-auth required)   SNF: 100% (pre-auth required) Outpatient: 100%     Co-Pay: none, no pre-auth required, visit limit based on med. necessity Home Health: 100%      Co-Pay: none, no pre-auth needed for days 0-60, yes after day 60. Visit limits: fewer than 8 hrs/day and less than 35 hrs/week. MD to certify need for [facility].   DME: 100%     Co-Pay:  None, some DME may require auth. Providers: in network  Emergency [place]   Name Relation Home Work Hawkins Spouse [phone]  [phone]     Current Medical History  Patient Admitting Diagnosis: Left BKA  History of Present Illness: [name] [name] is a 68 y.o. right-handed male with history of chronic systolic congestive heart failure, PAF with implantable cardioverter defibrillator. Patient lives with his wife uses a walker and wheelchair prior to admission. Admitted 11/09/2013 with left foot osteomyelitis and nonhealing ulcers. Limb was not felt to be salvageable. 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 Postoperative pain management. Postoperative anemia 8.7. Close monitoring of also relation to right foot with PRAFO boot in place Physical therapy  evaluation completed 11/10/2013 with recommendations for physical medicine rehabilitation consult.  Past Medical History  Past Medical History  Diagnosis Date  . GERD (gastroesophageal reflux disease)   . Obesity   . Cardiomyopathy, nonischemic     a. Cath 2003: mild nonobstructive CAD, EF 25% at that time.  . Hypertension   . Chronic systolic CHF (congestive heart failure)     a. NICM EF 25% dating back to at least 2003.  [name] PAF (paroxysmal atrial fibrillation)     a. Noted on ICD interrogation 2012;  b. coumadin d/c'd 01/2013.  [name] NSVT (nonsustained ventricular tachycardia)     a. Noted on ICD interrogation in 2011.  [name] LBBB (left bundle branch block)   . Kidney stone   . Critical lower limb ischemia   . High cholesterol   . PAD (peripheral artery disease)     a. 08/2013 Periph Angio/PTA: Abd Ao nl, RLE- 3v runoff, PT diff dzs, AT 90p, LLE 2v runoff, PT 100, AT 58m(diamondback ORA/chocolate balloon PTA).  . Type II diabetes mellitus   . Uncontrolled pain, Lt toe 09/21/2013  . Gangrenous toe, Lt toe 09/21/2013  . CAD (coronary artery disease)     a. Nonobstructive by cath 09/2001.  .Marland KitchenAutomatic implantable cardioverter-defibrillator in situ  a. 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  . Dementia   . Arthritis     Family History  family history includes Diabetes in his father and mother; Heart disease in his mother; Hypertension in his mother.  Prior Rehab/Hospitalizations: Following his previous toe amputations, pt went to SNF then to home and had home health care leading up to this admission. Last June 2014, pt was also at [facility] following a fall.  Current Medications  Current facility-administered medications:aspirin EC tablet 325 mg, 325 mg, Oral, Daily, Naiping Eduard Roux, MD, 325 mg at 11/14/13 1033;  atorvastatin (LIPITOR) tablet 10 mg, 10 mg, Oral, q1800, Naiping Eduard Roux, MD, 10 mg at 11/13/13 1616;  carvedilol (COREG) tablet 12.5 mg, 12.5 mg,  Oral, BID WC, Naiping Eduard Roux, MD, 12.5 mg at 11/14/13 0847 clopidogrel (PLAVIX) tablet 75 mg, 75 mg, Oral, Q breakfast, Naiping Eduard Roux, MD, 75 mg at 11/14/13 1033;  diphenhydrAMINE (BENADRYL) 12.5 MG/5ML elixir 25 mg, 25 mg, Oral, Q4H PRN, Marianna Payment, MD;  furosemide (LASIX) tablet 40 mg, 40 mg, Oral, Daily, Wenda Low, MD, 40 mg at 11/14/13 1032;  insulin aspart (novoLOG) injection 0-15 Units, 0-15 Units, Subcutaneous, TID WC, Irven Shelling, MD, 5 Units at 11/14/13 1205 insulin aspart (novoLOG) injection 0-5 Units, 0-5 Units, Subcutaneous, QHS, Irven Shelling, MD;  insulin aspart (novoLOG) injection 3 Units, 3 Units, Subcutaneous, TID WC, Irven Shelling, MD, 3 Units at 11/14/13 1204;  insulin detemir (LEVEMIR) injection 35 Units, 35 Units, Subcutaneous, QHS, Irven Shelling, MD, 35 Units at 11/13/13 2303 iron polysaccharides (NIFEREX) capsule 150 mg, 150 mg, Oral, Daily, Wenda Low, MD, 150 mg at 11/14/13 3716;  lactated ringers infusion, , Intravenous, Continuous, Duane Boston, MD, Last Rate: 50 mL/hr at 11/09/13 1244, 50 mL/hr at 11/09/13 1244;  metFORMIN (GLUCOPHAGE) tablet 500 mg, 500 mg, Oral, BID WC, Irven Shelling, MD, 500 mg at 11/14/13 0847 methocarbamol (ROBAXIN) 500 mg in dextrose 5 % 50 mL IVPB, 500 mg, Intravenous, Q6H PRN, Naiping Eduard Roux, MD;  methocarbamol (ROBAXIN) tablet 500 mg, 500 mg, Oral, Q6H PRN, Marianna Payment, MD, 500 mg at 11/12/13 1633;  metoCLOPramide (REGLAN) injection 5-10 mg, 5-10 mg, Intravenous, Q8H PRN, Naiping Eduard Roux, MD, 10 mg at 11/09/13 2327;  metoCLOPramide (REGLAN) tablet 5-10 mg, 5-10 mg, Oral, Q8H PRN, Naiping Eduard Roux, MD multivitamin with minerals tablet 1 tablet, 1 tablet, Oral, Daily, Heather Cornelison [place], RD, 1 tablet at 11/14/13 1032;  nutrition supplement (JUVEN) (JUVEN) powder packet 1 packet, 1 packet, Oral, BID BM, Heather Cornelison [place], RD, 1 packet at 11/14/13 1033;  ondansetron (ZOFRAN)  injection 4 mg, 4 mg, Intravenous, Q6H PRN, Marianna Payment, MD, 4 mg at 11/09/13 1830 ondansetron (ZOFRAN) tablet 4 mg, 4 mg, Oral, Q6H PRN, Marianna Payment, MD, 4 mg at 11/12/13 9678;  oxyCODONE (Oxy IR/ROXICODONE) immediate release tablet 5-15 mg, 5-15 mg, Oral, Q3H PRN, Marianna Payment, MD, 10 mg at 11/12/13 1633;  pantoprazole (PROTONIX) EC tablet 80 mg, 80 mg, Oral, Q1200, Naiping Eduard Roux, MD, 80 mg at 11/14/13 1205;  polyethylene glycol (MIRALAX / GLYCOLAX) packet 17 g, 17 g, Oral, Daily PRN, Naiping Eduard Roux, MD potassium chloride SA (K-DUR,KLOR-CON) CR tablet 20 mEq, 20 mEq, Oral, BID, Irven Shelling, MD, 20 mEq at 11/14/13 1032;  pregabalin (LYRICA) capsule 75 mg, 75 mg, Oral, BID, Naiping Eduard Roux, MD, 75 mg at 11/14/13 1032;  senna (SENOKOT) tablet 8.6 mg, 1 tablet, Oral, BID,  Marianna Payment, MD, 8.6 mg at 11/14/13 1033 silver sulfADIAZINE (SILVADENE) 1 % cream 1 application, 1 application, Topical, BID, Naiping Eduard Roux, MD, 1 application at 56/70/14 0645;  sorbitol 70 % solution 30 mL, 30 mL, Oral, Daily PRN, Marianna Payment, MD;  traMADol Veatrice Bourbon) tablet 50 mg, 50 mg, Oral, Q6H PRN, Wenda Low, MD, 50 mg at 11/11/13 1047  Patients Current Diet: Carb Control  Precautions / Restrictions Precautions Precautions: Fall Restrictions Weight Bearing Restrictions: Yes LLE Weight Bearing: Non weight bearing   Prior Activity Level Community (5-7x/wk): pt states he was independent and got out everyday. "I'm an outside personResearch officer, trade union / [place] Devices/Equipment: None Home Equipment: Environmental consultant - 2 wheels;Wheelchair - manual  Prior Functional Level Prior Function Level of Independence: Needs assistance Gait / Transfers Assistance Needed: amb with RW PRN  ADL's / Homemaking Assistance Needed: wife A with bathing back side, dressing as needed, and cooking Comments: pt reported he needed help at times but reports to be as  independent as he can be   Current Functional Level Cognition  Overall Cognitive Status: Within Functional Limits for tasks assessed Orientation Level: Oriented to person;Oriented to place;Oriented to situation General Comments: Pt sitting in wet diaper with very full urinal, did not call for help.      Extremity Assessment (includes Sensation/Coordination)          ADLs   Eating/Feeding: Independent  Where Assessed - Eating/Feeding: Chair Grooming: Wash/dry hands;Wash/dry face;Supervision/safety Where Assessed - Grooming: Unsupported sitting Upper Body Bathing: Minimal assistance Where Assessed - Upper Body Bathing: Unsupported sitting Lower Body Bathing: +2 Total assistance Lower Body Bathing: Patient Percentage: 0% Where Assessed - Lower Body Bathing: Unsupported sitting;Supported standing Upper Body Dressing: Set up Where Assessed - Upper Body Dressing: Supported sitting Lower Body Dressing: +2 Total assistance Lower Body Dressing: Patient Percentage: 0% Where Assessed - Lower Body Dressing: Unsupported sitting;Supported standing Toilet Transfer: +2 Total assistance;Moderate assistance Toilet Transfer Method: Stand pivot Science writer:  (bed to chair) Equipment Used: Gait belt;Rolling walker Transfers/Ambulation Related to ADLs: unable to ambulate, pivoted from bed to chair, bed height elevated    Mobility  Overal bed mobility: Needs Assistance Bed Mobility: Supine to Sit Supine to sit: Min assist General bed mobility comments: Verbal cues for technique, and Min assist for trunk control and scooting hip forward. HOB was flat and pt used rail.    Transfers  Overall transfer level: Needs assistance Equipment used: Rolling walker (2 wheeled) Transfers: Sit to/from Stand Sit to Stand: Min assist Stand pivot transfers: +2 physical assistance;Mod assist General transfer comment: Min assist to steady RW. Pt sit>stand with very little physical assist. Needs  verbal and tactile cues for upright posture and to bring hips forward to prevent RLE sliding forward on floor (wearing boot)    Ambulation / Gait / Stairs / Wheelchair Mobility  Ambulation/Gait Ambulation/Gait assistance: Min assist Ambulation Distance (Feet): 8 Feet Assistive device: Rolling walker (2 wheeled) Gait Pattern/deviations: Decreased step length - right General Gait Details: Pt with improved ability to use UEs for support on RW to allow hop with RLE. Pt tolerated a distance of 8 feet before needing to sit. Verbal cues for posture, and min assist for RW placement and stability    Posture / Balance Dynamic Sitting Balance Sitting balance - Comments: req mod A to come to sit then min A to maintain sitting posture    Special needs/care consideration BiPAP/CPAP no  CPM no  Continuous Drip IV no  Dialysis no          Life Vest no  Oxygen no  Special Bed no  Trach Size no  Wound Vac (area) no       Skin - current healing incision with L BKA, R boot with R big toe/heel sores                        Bowel mgmt: last BM 11-08-13 Bladder mgmt: some incontinence, using urinal, wife brought Depends from home Diabetic mgmt - yes, managed with insulin    Previous Home Environment Living Arrangements: Spouse/significant other Available Help at Discharge: Family, lives with wife and dtr (dtr works during day) and wife has a "sitting" job from noon to [employer] daily  Type of Home: [employer] Layout: Two level;Able to live on main level with bedroom/bathroom Alternate Level Stairs-Rails: Right Alternate Level Stairs-Number of Steps: one flight Home Access: Stairs to enter Entrance Stairs-Rails: None Entrance Stairs-Number of Steps: 2 Home Care Services: No Additional Comments: pt's RW is his brothers. pt will need one of his own  Discharge Living Setting Plans for Discharge Living Setting: Patient's home Type of Home at Discharge: House Discharge Home Layout: Two level;Able to live on  main level with bedroom/bathroom Alternate Level Stairs-Number of Steps:  (does not go upstairs) Discharge Home Access: Stairs to enter Entrance Stairs-Rails: Right Entrance Stairs-Number of Steps: 3 Does the patient have any problems obtaining your medications?: No  Social/Family/Support Systems Patient Roles: Spouse Contact Information: wife Enid Derry Anticipated Caregiver: wife Anticipated Ambulance person Information: see above Ability/Limitations of Caregiver: wife is often gone to her job as a Engineer, structural from noon-5 pm daily  (wife understands that pt may need 24 hr supervision unless cognition/mobility improves) Caregiver Availability: Other (Comment) (wife is typically gone from noon-5pm but understands pt may supervision) Discharge Plan Discussed with Primary Caregiver: Yes Is Caregiver In Agreement with Plan?: Yes Does Caregiver/Family have Issues with Lodging/Transportation while Pt is in Rehab?: No  Goals/Additional Needs Patient/Family Goal for Rehab: supervision to Mod I w/ PT , OT and SLP Expected length of stay: 7-10 days Cultural Considerations: none Dietary Needs: carb modified Equipment Needs: to be determined Pt/Family Agrees to Admission and willing to participate: Yes (spoke with wife Enid Derry on phone on 3-20 and in person on 3-) Program Orientation Provided & Reviewed with Pt/Caregiver Including Roles  & Responsibilities: Yes   Decrease burden of Care through IP rehab admission: NA  Possible need for SNF placement upon discharge: not anticipated   Patient Condition: This patient's medical and functional status has changed since the consult dated: 11-10-13 in which the Rehabilitation Physician determined and documented that the patient's condition is appropriate for intensive rehabilitative care in an inpatient rehabilitation facility. See "History of Present Illness" (above) for medical update. Functional changes are: minimal assist to ambulate 8' and  modA x 2 for commode transfer. Patient's medical and functional status update has been discussed with the Rehabilitation physician and patient remains appropriate for inpatient rehabilitation. Will admit to inpatient rehab today.  Preadmission Screen Completed By:  Nanetta Batty, PT 11/14/2013 12:23 PM ______________________________________________________________________   Discussed status with Dr. Naaman Plummer on 11-14-13 at 1249 and received telephone approval for admission today.  Admission Coordinator:  Nanetta Batty, PT time 1249/Date3-23-15

## 2013-11-14 NOTE — Progress Notes (Signed)
Patient arrived via recliner from 5N with RN and belongings. Explained rehab process, fall prevention plan, rehab safety plan, and call bell with verbal understanding. Patient sitting in recliner with call bell at side and no c/o of pain. Will continue to monitor.

## 2013-11-14 NOTE — Progress Notes (Signed)
Rehab admissions - I did receive insurance approval and bed is available today for inpt rehab. Spoke directly with Dr. Erlinda Hong who stated that pt is medically stable for inpatient rehab. I then met with pt and his wife to share the news. Paperwork completed.  Updated Ricki Miller, case manager and [place], social worker in person of plan to bring pt to inpatient rehab later today. Rn also informed.  Please call me with any questions. Thanks.  Nanetta Batty, PT Rehabilitation Admissions Coordinator [phone]

## 2013-11-14 NOTE — H&P (View-Only) (Signed)
Physical Medicine and Rehabilitation Admission H&P    No chief complaint on file. :  Chief complaint: Left stump pain  HPI: [name] [name] is a 68 y.o. right-handed male with history of diabetes mellitus or peripheral neuropathy, chronic systolic congestive heart failure, PAF with implantable cardioverter defibrillator. Patient lives with his wife uses a walker and wheelchair prior to admission. Admitted 11/09/2013 with left foot osteomyelitis and nonhealing ulcers. Limb was not felt to be salvageable. 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 Postoperative pain management. Postoperative anemia 8.0 and transfuse 11/11/2013. Close monitoring right foot wound with right toe ulceration and blackened heel. Bouts of confusion? Chronic induced. Physical and occupational therapy evaluations completed 11/10/2013 with recommendations for physical medicine rehabilitation consult. Patient was admitted for comprehensive rehabilitation program   ROS Review of Systems  Respiratory: Positive for shortness of breath.  Cardiovascular: Positive for leg swelling.  Gastrointestinal:  GERD  All other systems reviewed and are negative  Past Medical History  Diagnosis Date  . GERD (gastroesophageal reflux disease)   . Obesity   . Cardiomyopathy, nonischemic     a. Cath 2003: mild nonobstructive CAD, EF 25% at that time.  . Hypertension   . Chronic systolic CHF (congestive heart failure)     a. NICM EF 25% dating back to at least 2003.  [name] PAF (paroxysmal atrial fibrillation)     a. Noted on ICD interrogation 2012;  b. coumadin d/c'd 01/2013.  [name] NSVT (nonsustained ventricular tachycardia)     a. Noted on ICD interrogation in 2011.  [name] LBBB (left bundle branch block)   . Kidney stone   . Critical lower limb ischemia   . High cholesterol   . PAD (peripheral artery disease)     a. 08/2013 Periph Angio/PTA: Abd Ao nl, RLE- 3v runoff, PT diff dzs, AT 90p, LLE 2v runoff, PT 100, AT 36m  (diamondback ORA/chocolate balloon PTA).  . Type II diabetes mellitus   . Uncontrolled pain, Lt toe 09/21/2013  . Gangrenous toe, Lt toe 09/21/2013  . CAD (coronary artery disease)     a. Nonobstructive by cath 09/2001.  [name] Automatic implantable cardioverter-defibrillator in situ     a. 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  . Dementia   . Arthritis    Past Surgical History  Procedure Laterality Date  . Lithotripsy  2001  . Cervical spine surgery  1994  . Cardiac defibrillator placement  06/2009    WITH GENERATOR REPLACED; BiV ICD  . US echocardiography  03/21/2008    EF 30-35%  . Cardiovascular stress test  03/20/2009    EF 33%  . Transluminal atherectomy tibial artery Left 09/12/2013  . Cardiac catheterization  10/01/2001    THERE WAS GLOBAL HYPOKINESIS AND EF 25%. THERE APPEARED TO BE GLOBAL DECREASE IN WALL MOTION  . Toe amputation  10/04/2013    LEFT GREAT TOE AND 4TH TOE   /   DR Erlinda Hong  . Amputation Left 10/04/2013    Procedure: LEFT GREAT TOE AND SECOND TOE AMPUTATION;  Surgeon: Marianna Payment, MD;  Location: [facility];  Service: Orthopedics;  Laterality: Left;  . Colonoscopy    . Leg amputation below knee Left 11/09/2013    DR Erlinda Hong  . Amputation Left 11/09/2013    Procedure: LEFT AMPUTATION BELOW KNEE;  Surgeon: Marianna Payment, MD;  Location: [facility];  Service: Orthopedics;  Laterality: Left;   Family History  Problem Relation Age of Onset  . Heart  disease Mother   . Hypertension Mother   . Diabetes Mother   . Diabetes Father    Social History:  reports that he has never smoked. He has never used smokeless tobacco. He reports that he does not drink alcohol or use illicit drugs. Allergies: No Known Allergies Medications Prior to Admission  Medication Sig Dispense Refill  . aspirin EC 81 MG tablet Take 81 mg by mouth daily.      [name] atorvastatin (LIPITOR) 10 MG tablet Take 10 mg by mouth daily.        . carvedilol (COREG) 12.5 MG tablet Take 12.5 mg  by mouth 2 (two) times daily with a meal.      . clopidogrel (PLAVIX) 75 MG tablet Take 1 tablet (75 mg total) by mouth daily with breakfast.  30 tablet  6  . doxycycline (VIBRA-TABS) 100 MG tablet Take 100 mg by mouth 2 (two) times daily. Toe wound. Started on 09/02/13      . esomeprazole (NEXIUM) 40 MG capsule Take 40 mg by mouth daily as needed (acid reflux).       . furosemide (LASIX) 40 MG tablet Take 1 tablet (40 mg total) by mouth 2 (two) times daily.  60 tablet  1  . HYDROcodone-acetaminophen (NORCO) 5-325 MG per tablet Take 1-2 tablets by mouth every 6 (six) hours as needed.  90 tablet  0  . insulin detemir (LEVEMIR) 100 UNIT/ML injection Inject 45 Units into the skin at bedtime.      . metFORMIN (GLUCOPHAGE) 500 MG tablet Take 1 tablet (500 mg total) by mouth 2 (two) times daily.      . Multiple Vitamins-Minerals (MULTIVITAMIN PO) Take 1 tablet by mouth daily.      [name] NOVOLOG 100 UNIT/ML injection Inject 4 Units into the skin as needed for high blood sugar.       . omeprazole (PRILOSEC) 20 MG capsule Take 20 mg by mouth daily.       [name] SANTYL ointment Apply 1 application topically every other day as needed (wounds).       . silver sulfADIAZINE (SILVADENE) 1 % cream Apply 1 application topically daily.       [name] spironolactone (ALDACTONE) 25 MG tablet Take 0.5 tablets (12.5 mg total) by mouth daily.  90 tablet  0  . vitamin E (VITAMIN E) 1000 UNIT capsule Take 1,000 Units by mouth daily.      [name] ACCU-CHEK AVIVA PLUS test strip 1 each by Other route daily as needed (blood glucose check).       Danny Lawless SOFTCLIX LANCETS lancets 1 each by Other route daily as needed (blood glucose check).       . Blood Glucose Monitoring Suppl (ACCU-CHEK AVIVA PLUS) W/DEVICE KIT         Home: Home Living Family/patient expects to be discharged to:: Unsure Living Arrangements: Spouse/significant other Available Help at Discharge: Family Type of Home: House Home Access: Stairs to enter Engineer, site of Steps: 2 Entrance Stairs-Rails: None Home Layout: Two level (able to live on main level but full bathroom is on 2nd story) Alternate Level Stairs-Number of Steps: one flight Alternate Level Stairs-Rails: Right Home Equipment: Walker - 2 wheels;Wheelchair - manual Additional Comments: pt's RW is his brothers. pt will need one of his own   Functional History: Prior Function Comments: pt reported he needed help at times but reports to be as independent as he can be   Functional Status:  Mobility: min assist for transfers  and short dx gait 8'     Ambulation/Gait Ambulation Distance (Feet): 4 Feet General Gait Details: Pt with significant posterior lean, having great difficulty with concept of using UE's for support to advance right foot forward. Pt eventually decreased posterior lean to Min assist from PT for support, however is unable to lift RLE from floor.    ADL: min to mod assist    Cognition: Cognition Overall Cognitive Status: Within Functional Limits for tasks assessed Orientation Level: Oriented to person;Oriented to place;Oriented to situation Cognition Arousal/Alertness: Awake/alert Behavior During Therapy: WFL for tasks assessed/performed Overall Cognitive Status: Within Functional Limits for tasks assessed    Physical Exam: Blood pressure 124/60, pulse 99, temperature 98.6 F (37 C), temperature source Oral, resp. rate 18, weight 80.701 kg (177 lb 14.6 oz), SpO2 96.00%. Physical Exam Constitutional: He is oriented to person, place, and time. No distress HENT: oral mucosa pink and moist. Poor dentition Head: Normocephalic.  Eyes: EOM are normal.  Neck: Normal range of motion. Neck supple. No thyromegaly present.  Cardiovascular: Normal rate and regular rhythm. No murmurs, rubs, or gallops Respiratory: Effort normal and breath sounds normal. No respiratory distress. No wheezes, rales, or rhonchi GI: Soft. Bowel sounds are normal. He exhibits no  distension. Non-tender  Neurological: He is alert and oriented to person, place, and time. UE 5/5 prox to distal. LLE 3+ HF, KE. RLE near 4/5 HF, KE and ankle. Decreased sensation to LT, PP distally RLE  Skin:  BKA site is dressed with immediate coban/post-op dressing. Right foot with toe ulceration at tip of big toe and between 1st and 2nd toe as well as blackened heel     Results for orders placed during the hospital encounter of 11/09/13 (from the past 48 hour(s))  GLUCOSE, CAPILLARY     Status: Abnormal   Collection Time    11/09/13  3:41 PM      Result Value Ref Range   Glucose-Capillary 116 (*) 70 - 99 mg/dL   Comment 1 Notify RN    HEMOGLOBIN A1C     Status: Abnormal   Collection Time    11/09/13  8:53 PM      Result Value Ref Range   Hemoglobin A1C 8.6 (*) <5.7 %   Comment: (NOTE)                                                                               According to the ADA Clinical Practice Recommendations for 2011, when     HbA1c is used as a screening test:      >=6.5%   Diagnostic of Diabetes Mellitus               (if abnormal result is confirmed)     5.7-6.4%   Increased risk of developing Diabetes Mellitus     References:Diagnosis and Classification of Diabetes Mellitus,Diabetes     JJHE,1740,81(KGYJE 1):S62-S69 and Standards of Medical Care in             Diabetes - 2011,Diabetes Care,2011,34 (Suppl 1):S11-S61.   Mean Plasma Glucose 200 (*) <117 mg/dL   Comment: Performed at [facility], CAPILLARY     Status: Abnormal  Collection Time    11/09/13  9:48 PM      Result Value Ref Range   Glucose-Capillary 183 (*) 70 - 99 mg/dL  COMPREHENSIVE METABOLIC PANEL     Status: Abnormal   Collection Time    11/10/13  6:24 AM      Result Value Ref Range   Sodium 144  137 - 147 mEq/L   Potassium 3.6 (*) 3.7 - 5.3 mEq/L   Chloride 103  96 - 112 mEq/L   CO2 27  19 - 32 mEq/L   Glucose, Bld 171 (*) 70 - 99 mg/dL   BUN 14  6 - 23 mg/dL   Creatinine,  Ser 8.91  0.50 - 1.35 mg/dL   Calcium 8.6  8.4 - 69.4 mg/dL   Total Protein 6.2  6.0 - 8.3 g/dL   Albumin 2.6 (*) 3.5 - 5.2 g/dL   AST 12  0 - 37 U/L   ALT 6  0 - 53 U/L   Alkaline Phosphatase 50  39 - 117 U/L   Total Bilirubin 0.3  0.3 - 1.2 mg/dL   GFR calc non Af Amer 86 (*) >90 mL/min   GFR calc Af Amer >90  >90 mL/min   Comment: (NOTE)     The eGFR has been calculated using the CKD EPI equation.     This calculation has not been validated in all clinical situations.     eGFR's persistently <90 mL/min signify possible Chronic Kidney     Disease.  GLUCOSE, CAPILLARY     Status: Abnormal   Collection Time    11/10/13  6:45 AM      Result Value Ref Range   Glucose-Capillary 179 (*) 70 - 99 mg/dL  CBC     Status: Abnormal   Collection Time    11/10/13 11:05 AM      Result Value Ref Range   WBC 9.7  4.0 - 10.5 K/uL   RBC 3.59 (*) 4.22 - 5.81 MIL/uL   Hemoglobin 8.7 (*) 13.0 - 17.0 g/dL   HCT 50.3 (*) 88.8 - 28.0 %   MCV 74.1 (*) 78.0 - 100.0 fL   MCH 24.2 (*) 26.0 - 34.0 pg   MCHC 32.7  30.0 - 36.0 g/dL   RDW 03.4 (*) 91.7 - 91.5 %   Platelets 250  150 - 400 K/uL  GLUCOSE, CAPILLARY     Status: Abnormal   Collection Time    11/10/13 11:36 AM      Result Value Ref Range   Glucose-Capillary 222 (*) 70 - 99 mg/dL   Comment 1 Notify RN     Comment 2 Documented in Chart    GLUCOSE, CAPILLARY     Status: Abnormal   Collection Time    11/10/13  4:07 PM      Result Value Ref Range   Glucose-Capillary 260 (*) 70 - 99 mg/dL   Comment 1 Notify RN     Comment 2 Documented in Chart    GLUCOSE, CAPILLARY     Status: Abnormal   Collection Time    11/10/13  9:23 PM      Result Value Ref Range   Glucose-Capillary 201 (*) 70 - 99 mg/dL  COMPREHENSIVE METABOLIC PANEL     Status: Abnormal   Collection Time    11/11/13  4:10 AM      Result Value Ref Range   Sodium 142  137 - 147 mEq/L   Potassium 3.8  3.7 - 5.3 mEq/L  Chloride 103  96 - 112 mEq/L   CO2 28  19 - 32 mEq/L    Glucose, Bld 168 (*) 70 - 99 mg/dL   BUN 12  6 - 23 mg/dL   Creatinine, Ser 0.88  0.50 - 1.35 mg/dL   Calcium 8.8  8.4 - 10.5 mg/dL   Total Protein 6.4  6.0 - 8.3 g/dL   Albumin 2.5 (*) 3.5 - 5.2 g/dL   AST 12  0 - 37 U/L   ALT 6  0 - 53 U/L   Alkaline Phosphatase 52  39 - 117 U/L   Total Bilirubin 0.4  0.3 - 1.2 mg/dL   GFR calc non Af Amer 85 (*) >90 mL/min   GFR calc Af Amer >90  >90 mL/min   Comment: (NOTE)     The eGFR has been calculated using the CKD EPI equation.     This calculation has not been validated in all clinical situations.     eGFR's persistently <90 mL/min signify possible Chronic Kidney     Disease.  CBC     Status: Abnormal   Collection Time    11/11/13  4:10 AM      Result Value Ref Range   WBC 8.3  4.0 - 10.5 K/uL   RBC 3.29 (*) 4.22 - 5.81 MIL/uL   Hemoglobin 8.0 (*) 13.0 - 17.0 g/dL   HCT 24.4 (*) 39.0 - 52.0 %   MCV 74.2 (*) 78.0 - 100.0 fL   MCH 24.3 (*) 26.0 - 34.0 pg   MCHC 32.8  30.0 - 36.0 g/dL   RDW 17.5 (*) 11.5 - 15.5 %   Platelets 224  150 - 400 K/uL  GLUCOSE, CAPILLARY     Status: Abnormal   Collection Time    11/11/13  6:18 AM      Result Value Ref Range   Glucose-Capillary 137 (*) 70 - 99 mg/dL  PREPARE RBC (CROSSMATCH)     Status: None   Collection Time    11/11/13  8:40 AM      Result Value Ref Range   Order Confirmation ORDER PROCESSED BY BLOOD BANK    TYPE AND SCREEN     Status: None   Collection Time    11/11/13  8:40 AM      Result Value Ref Range   ABO/RH(D) O POS     Antibody Screen NEG     Sample Expiration 11/14/2013     Unit Number [id number]     Blood Component Type RED CELLS,LR     Unit division 00     Status of Unit ISSUED     Transfusion Status OK TO TRANSFUSE     Crossmatch Result Compatible     Unit Number [id number]     Blood Component Type RED CELLS,LR     Unit division 00     Status of Unit ALLOCATED     Transfusion Status OK TO TRANSFUSE     Crossmatch Result Compatible    ABO/RH     Status:  None   Collection Time    11/11/13  8:40 AM      Result Value Ref Range   ABO/RH(D) O POS    GLUCOSE, CAPILLARY     Status: Abnormal   Collection Time    11/11/13 11:10 AM      Result Value Ref Range   Glucose-Capillary 250 (*) 70 - 99 mg/dL   Comment 1 Notify RN     Comment 2  Documented in Chart     No results found.  Post Admission Physician Evaluation: 1. Functional deficits secondary  to left BKA. 2. Patient is admitted to receive collaborative, interdisciplinary care between the physiatrist, rehab nursing staff, and therapy team. 3. Patient's level of medical complexity and substantial therapy needs in context of that medical necessity cannot be provided at a lesser intensity of care such as a SNF. 4. Patient has experienced substantial functional loss from his/her baseline which was documented above under the "Functional History" and "Functional Status" headings.  Judging by the patient's diagnosis, physical exam, and functional history, the patient has potential for functional progress which will result in measurable gains while on inpatient rehab.  These gains will be of substantial and practical use upon discharge  in facilitating mobility and self-care at the household level. 5. Physiatrist will provide 24 hour management of medical needs as well as oversight of the therapy plan/treatment and provide guidance as appropriate regarding the interaction of the two. 6. 24 hour rehab nursing will assist with bladder management, bowel management, safety, skin/wound care, disease management, medication administration, pain management and patient education  and help integrate therapy concepts, techniques,education, etc. 7. PT will assess and treat for/with: Lower extremity strength, range of motion, stamina, balance, functional mobility, safety, adaptive techniques and equipment, pre-pros ed. Pain mgt, wound observation.   Goals are: mod I. 8. OT will assess and treat for/with: ADL's,  functional mobility, safety, upper extremity strength, adaptive techniques and equipment, pre-pros ed, pain mgt, ego support.   Goals are: mod I. 9. SLP will assess and treat for/with: n/a.  Goals are: n/a. 10. Case Management and Social Worker will assess and treat for psychological issues and discharge planning. 11. Team conference will be held weekly to assess progress toward goals and to determine barriers to discharge. 12. Patient will receive at least 3 hours of therapy per day at least 5 days per week. 13. ELOS: 7 days       14. Prognosis:  excellent   Medical Problem List and Plan: 1. 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 DVT Prophylaxis/Anticoagulation: SCDs right lower extremity 3. Pain Management: Lyrica 75 mg twice a day, Oxycodone and Robaxin as needed. 4. Neuropsych: This patient is capable of making decisions on his own behalf. 5. Acute blood loss anemia. Continue Niferex tablet daily. Followup CBC 6. Right foot wound. Right toe ulceration with blackened heel. Silvadene twice a day applied to the right toe wounds. Consider adaptive shoe for right foot (didn't see one in the room) 7. Diabetes mellitus with peripheral neuropathy. Hemoglobin A1c 8.6. Glucophage 500 mg twice a day, Levemir 35 units each bedtime and NovoLog 20 units 3 times a day. Check blood sugars a.c. and at bedtime 8. Chronic systolic congestive heart failure. Lasix 40 mg daily. Monitor for any signs of fluid overload 9. PAF with implantable cardioverter defibrillator. Cardiac rate control. Her chest pain or shortness of breath. 10. Hypertension. Coreg 12.5 mg twice a day. Monitor the increased mobility 11. Hyperlipidemia. Lipitor  Meredith Staggers, MD, [facility]  11/11/2013

## 2013-11-14 NOTE — Progress Notes (Signed)
Rehab admissions - I received a call back from [facility] and have now submitted clinicals this am. I await their call back for authorization for inpatient rehab.  I will touch base with the pt later today and keep the pt/medical team aware of any updates as I wait to hear back from [facility].  Please call me with any questions. Thanks.  Nanetta Batty, PT Rehabilitation Admissions Coordinator [phone]

## 2013-11-14 NOTE — Progress Notes (Signed)
Physical Therapy Treatment Patient Details Name: [name] [name] MRN: [id number] DOB: June 21, 1946 Today's Date: 11/14/2013 Time: 4098-1191 PT Time Calculation (min): 31 min  PT Assessment / Plan / Recommendation  History of Present Illness 68 y/o male s/p L BKA and R LE wounds.     PT Comments   Pt is progressing well towards physical therapy goals, improving balance and ambulatory distance this AM. He will greatly benefit from CIR to improve his independence with functional mobility. Acute PT will continue to follow until d/c.   Follow Up Recommendations  CIR     Does the patient have the potential to tolerate intense rehabilitation     Barriers to Discharge        Equipment Recommendations  Rolling walker with 5" wheels    Recommendations for Other Services Rehab consult  Frequency Min 3X/week   Progress towards PT Goals Progress towards PT goals: Progressing toward goals  Plan Current plan remains appropriate    Precautions / Restrictions Precautions Precautions: Fall Restrictions Weight Bearing Restrictions: Yes LLE Weight Bearing: Non weight bearing   Pertinent Vitals/Pain Pt reports 0/10 pain  Pt repositioned in chair for comfort; residual limb elevated and positioned for optimal extension    Mobility  Bed Mobility Overal bed mobility: Needs Assistance Bed Mobility: Supine to Sit Supine to sit: Min assist General bed mobility comments: Verbal cues for technique, and Min assist for trunk control and scooting hip forward. HOB was flat and pt used rail. Transfers Overall transfer level: Needs assistance Equipment used: Rolling walker (2 wheeled) Transfers: Sit to/from Stand Sit to Stand: Min assist General transfer comment: Min assist to steady RW. Pt sit>stand with very little physical assist. Needs verbal and tactile cues for upright posture and to bring hips forward to prevent RLE sliding forward on floor (wearing boot) Ambulation/Gait Ambulation/Gait  assistance: Min assist Ambulation Distance (Feet): 8 Feet Assistive device: Rolling walker (2 wheeled) Gait Pattern/deviations: Decreased step length - right General Gait Details: Pt with improved ability to use UEs for support on RW to allow hop with RLE. Pt tolerated a distance of 8 feet before needing to sit. Verbal cues for posture, and min assist for RW placement and stability    Exercises Amputee Exercises Quad Sets: AROM;Left;10 reps;Supine Hip Extension: AROM;Left;10 reps;Prone;Standing Hip Flexion/Marching: AROM;Left;5 reps;Standing Knee Flexion: AROM;Left;10 reps;Seated;Other (comment) (prone and seated) Knee Extension: AROM;Left;10 reps;Seated;Other (comment) (Prone and seated) Straight Leg Raises: AROM;Left;10 reps;Supine   PT Diagnosis:    PT Problem List:   PT Treatment Interventions:     PT Goals (current goals can now be found in the care plan section) Acute Rehab PT Goals PT Goal Formulation: With patient Time For Goal Achievement: 11/17/13 Potential to Achieve Goals: Good  Visit Information  Last PT Received On: 11/14/13 Assistance Needed: +1 History of Present Illness: 68 y/o male s/p L BKA and R LE wounds.      Subjective Data  Subjective: Pt states he did not get out of bed over the weekend and would like to do so at this time   Cognition  Cognition Arousal/Alertness: Awake/alert Behavior During Therapy: [facility] for tasks assessed/performed Overall Cognitive Status: Within Functional Limits for tasks assessed    Balance  General Comments General comments (skin integrity, edema, etc.): R foot wrapped in bandages to covers dry cracked skin. Transfered with boot in place  End of Session PT - End of Session Equipment Utilized During Treatment: Gait belt;Other (comment) (Boot for R foot) Activity Tolerance: Patient tolerated  treatment well Patient left: in chair;with call bell/phone within reach Nurse Communication: Mobility status   GP    [place], [place]   Ellouise Newer 11/14/2013, 11:02 AM

## 2013-11-14 NOTE — Discharge Summary (Signed)
Physician Discharge Summary      Patient ID: [name] [name] MRN: [id number] DOB/AGE: 01/30/1946 68 y.o.  Admit date: 11/09/2013 Discharge date: 11/14/2013  Admission Diagnoses:  Foot osteomyelitis, left  Discharge Diagnoses:  Principal Problem:   Foot osteomyelitis, left Active Problems:   SYSTOLIC HEART FAILURE, CHRONIC   HTN (hypertension)   PAD (peripheral artery disease)   Hyperlipidemia   PAF (paroxysmal atrial fibrillation)   Gangrenous toe, Lt toe   Type II or unspecified type diabetes mellitus   Acute blood loss anemia   Past Medical History  Diagnosis Date  . GERD (gastroesophageal reflux disease)   . Obesity   . Cardiomyopathy, nonischemic     a. Cath 2003: mild nonobstructive CAD, EF 25% at that time.  . Hypertension   . Chronic systolic CHF (congestive heart failure)     a. NICM EF 25% dating back to at least 2003.  [name] PAF (paroxysmal atrial fibrillation)     a. Noted on ICD interrogation 2012;  b. coumadin d/c'd 01/2013.  [name] NSVT (nonsustained ventricular tachycardia)     a. Noted on ICD interrogation in 2011.  [name] LBBB (left bundle branch block)   . Kidney stone   . Critical lower limb ischemia   . High cholesterol   . PAD (peripheral artery disease)     a. 08/2013 Periph Angio/PTA: Abd Ao nl, RLE- 3v runoff, PT diff dzs, AT 90p, LLE 2v runoff, PT 100, AT 40m (diamondback ORA/chocolate balloon PTA).  . Type II diabetes mellitus   . Uncontrolled pain, Lt toe 09/21/2013  . Gangrenous toe, Lt toe 09/21/2013  . CAD (coronary artery disease)     a. Nonobstructive by cath 09/2001.  [name] Automatic implantable cardioverter-defibrillator in situ     a. 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  . Dementia   . 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  male who was admitted 11/09/2013 with a diagnosis of Foot osteomyelitis, left and went to the operating room on 11/09/2013 and underwent the above named procedures.    He was given perioperative antibiotics:      Anti-infectives   Start     Dose/Rate Route Frequency Ordered Stop   11/09/13 2000  ceFAZolin (ANCEF) IVPB 2 g/50 mL premix     2 g 100 mL/hr over 30 Minutes Intravenous Every 6 hours 11/09/13 1807 11/10/13 0903   11/09/13 0600  ceFAZolin (ANCEF) IVPB 2 g/50 mL premix  Status:  Discontinued     2 g 100 mL/hr over 30 Minutes Intravenous On call to O.R. 11/08/13 1422 11/09/13 1807    .  He was given sequential compression devices, early ambulation, and aspirin and plavix for DVT prophylaxis.  He benefited maximally from the hospital stay and there were no complications.    Recent vital signs:  Filed Vitals:   11/14/13 0549  BP: 117/61  Pulse: 90  Temp: 98.2 F (36.8 C)  Resp: 16    Recent laboratory studies:  Lab Results  Component Value Date   HGB 10.2* 11/12/2013   HGB 8.0* 11/11/2013   HGB 8.7* 11/10/2013   Lab Results  Component Value Date   WBC 8.9 11/12/2013   PLT 228 11/12/2013   Lab Results  Component Value Date   INR 0.95 11/08/2013   Lab Results  Component Value Date   NA 141 11/14/2013   K 4.8 11/14/2013   CL 102 11/14/2013   CO2 27 11/14/2013   BUN 24* 11/14/2013   CREATININE 0.94 11/14/2013   GLUCOSE 217* 11/14/2013    Discharge Medications:     Medication List         ACCU-CHEK AVIVA PLUS test strip  Generic drug:  glucose blood  1 each by Other route daily as needed (blood glucose check).     ACCU-CHEK AVIVA PLUS W/DEVICE Kit     ACCU-CHEK SOFTCLIX LANCETS lancets  1 each by Other route daily as needed (blood glucose check).     aspirin EC 81 MG tablet  Take 81 mg by mouth daily.     atorvastatin 10 MG tablet  Commonly known as:  LIPITOR  Take 10 mg by mouth daily.     carvedilol 12.5 MG tablet  Commonly known as:  COREG  Take  12.5 mg by mouth 2 (two) times daily with a meal.     clopidogrel 75 MG tablet  Commonly known as:  PLAVIX  Take 1 tablet (75 mg total) by mouth daily with breakfast.     doxycycline 100 MG tablet  Commonly known as:  VIBRA-TABS  Take 100 mg by mouth 2 (two) times daily. Toe wound. Started on 09/02/13     esomeprazole 40 MG capsule  Commonly known as:  NEXIUM  Take 40 mg by mouth daily as needed (acid reflux).     furosemide 40 MG tablet  Commonly known as:  LASIX  Take 1 tablet (40 mg total) by mouth 2 (two) times daily.     HYDROcodone-acetaminophen 5-325 MG per tablet  Commonly known as:  NORCO  Take 1-2 tablets by mouth every 6 (six) hours as needed.     HYDROcodone-acetaminophen 5-325 MG per tablet  Commonly known as:  NORCO  Take 1-2 tablets by mouth every 6 (six) hours as needed.     HYDROcodone-acetaminophen 5-325 MG per tablet  Commonly known as:  NORCO  Take 1-2 tablets by mouth every 6 (six) hours as needed.     insulin detemir 100 UNIT/ML injection  Commonly known as:  LEVEMIR  Inject 45 Units into the skin at bedtime.     metFORMIN 500 MG tablet  Commonly known as:  GLUCOPHAGE  Take 1 tablet (500 mg total) by mouth 2 (two) times daily.     MULTIVITAMIN PO  Take 1 tablet by mouth daily.     NOVOLOG 100 UNIT/ML injection  Generic drug:  insulin aspart  Inject 4 Units into the skin as needed for high blood sugar.     omeprazole 20 MG capsule  Commonly known as:  PRILOSEC  Take 20 mg by mouth daily.     SANTYL ointment  Generic drug:  collagenase  Apply 1 application topically every other day as needed (wounds).     silver sulfADIAZINE 1 % cream  Commonly known as:  SILVADENE  Apply 1 application topically daily.     spironolactone 25 MG tablet  Commonly known as:  ALDACTONE  Take 0.5 tablets (12.5 mg total) by mouth daily.     vitamin E 1000 UNIT capsule  Generic drug:  vitamin E  Take 1,000 Units by mouth daily.        Diagnostic Studies:  No results found.  Disposition: 03-Skilled Nursing Facility  Discharge Orders   Future Orders Complete By Expires   Call MD / Call 911  As  directed    Comments:     If you experience chest pain or shortness of breath, CALL 911 and be transported to the hospital emergency room.  If you develope a fever above 101.5 F, pus (white drainage) or increased drainage or redness at the wound, or calf pain, call your surgeon's office.   Constipation Prevention  As directed    Comments:     Drink plenty of fluids.  Prune juice may be helpful.  You may use a stool softener, such as Colace (over the counter) 100 mg twice a day.  Use MiraLax (over the counter) for constipation as needed.   Diet - low sodium heart healthy  As directed    Driving restrictions  As directed    Comments:     No driving while taking narcotic pain meds.   Increase activity slowly as tolerated  As directed    Non weight bearing  As directed    Questions:     Laterality:     Extremity:     Non weight bearing  As directed    Questions:     Laterality:     Extremity:        Follow-up Information   Follow up with Marianna Payment, MD In 2 weeks.   Specialty:  Orthopedic Surgery   Contact information:   [place] [id number] [phone]        Signed: Marianna Payment 11/14/2013, 10:56 AM

## 2013-11-14 NOTE — Interval H&P Note (Signed)
Sedric C Echavarria was admitted today to Inpatient Rehabilitation with the diagnosis of left BKA.  The patient's history has been reviewed, patient examined, and there is no change in status.  Patient continues to be appropriate for intensive inpatient rehabilitation.  I have reviewed the patient's chart and labs.  Questions were answered to the patient's satisfaction.  SWARTZ,ZACHARY T 11/14/2013, 8:45 PM

## 2013-11-14 NOTE — Progress Notes (Signed)
Patient stable Dressing c/d/i CIR pending  N. Eduard Roux, MD [facility] 8:00 AM

## 2013-11-14 NOTE — Progress Notes (Signed)
Subjective: Feeling okay. No shortness of breath orthopnea. O2 99% on room air  Objective: Weight change:   Intake/Output Summary (Last 24 hours) at 11/14/13 0726 Last data filed at 11/14/13 0549  Gross per 24 hour  Intake    680 ml  Output   1600 ml  Net   -920 ml   Filed Vitals:   11/13/13 0545 11/13/13 1641 11/13/13 1905 11/14/13 0549  BP: 115/63   117/61  Pulse: 92 101  90  Temp: 99.6 F (37.6 C) 100.4 F (38 C) 99.4 F (37.4 C) 98.2 F (36.8 C)  TempSrc: Oral Oral Oral Oral  Resp: _0 Weight:      SpO2: 97% 100%  99%    General appearance: alert and cooperative  Resp: clear to auscultation bilaterally  Cardio: regular rate and rhythm, S1, S2 normal, no murmur, click, rub or gallop  Extremities: Status post left BKA, dime-sized shallow ulceration on right great toe medially, no edema   Lab Results: Results for orders placed during the hospital encounter of 11/09/13 (from the past 48 hour(s))  GLUCOSE, CAPILLARY     Status: Abnormal   Collection Time    11/12/13 11:12 AM      Result Value Ref Range   Glucose-Capillary 213 (*) 70 - 99 mg/dL  GLUCOSE, CAPILLARY     Status: Abnormal   Collection Time    11/12/13  4:45 PM      Result Value Ref Range   Glucose-Capillary 285 (*) 70 - 99 mg/dL  GLUCOSE, CAPILLARY     Status: Abnormal   Collection Time    11/12/13 10:02 PM      Result Value Ref Range   Glucose-Capillary 146 (*) 70 - 99 mg/dL   Comment 1 Notify RN     Comment 2 Documented in Chart    GLUCOSE, CAPILLARY     Status: Abnormal   Collection Time    11/13/13  6:30 AM      Result Value Ref Range   Glucose-Capillary 171 (*) 70 - 99 mg/dL   Comment 1 Documented in Chart     Comment 2 Notify RN    GLUCOSE, CAPILLARY     Status: Abnormal   Collection Time    11/13/13 11:20 AM      Result Value Ref Range   Glucose-Capillary 290 (*) 70 - 99 mg/dL  GLUCOSE, CAPILLARY     Status: Abnormal   Collection Time    11/13/13  4:42 PM      Result Value  Ref Range   Glucose-Capillary 250 (*) 70 - 99 mg/dL  GLUCOSE, CAPILLARY     Status: Abnormal   Collection Time    11/13/13 10:06 PM      Result Value Ref Range   Glucose-Capillary 181 (*) 70 - 99 mg/dL  BASIC METABOLIC PANEL     Status: Abnormal   Collection Time    11/14/13  5:27 AM      Result Value Ref Range   Sodium 141  137 - 147 mEq/L   Potassium 4.8  3.7 - 5.3 mEq/L   Comment: DELTA CHECK NOTED   Chloride 102  96 - 112 mEq/L   CO2 27  19 - 32 mEq/L   Glucose, Bld 217 (*) 70 - 99 mg/dL   BUN 24 (*) 6 - 23 mg/dL   Creatinine, Ser 0.94  0.50 - 1.35 mg/dL   Calcium 9.4  8.4 - 10.5 mg/dL   GFR calc  non Af Amer 83 (*) >90 mL/min   GFR calc Af Amer >90  >90 mL/min   Comment: (NOTE)     The eGFR has been calculated using the CKD EPI equation.     This calculation has not been validated in all clinical situations.     eGFR's persistently <90 mL/min signify possible Chronic Kidney     Disease.    Studies/Results: No results found. Medications: Scheduled Meds: . aspirin EC  325 mg Oral Daily  . atorvastatin  10 mg Oral q1800  . carvedilol  12.5 mg Oral BID WC  . clopidogrel  75 mg Oral Q breakfast  . furosemide  40 mg Oral Daily  . insulin aspart  0-15 Units Subcutaneous TID WC  . insulin aspart  0-5 Units Subcutaneous QHS  . insulin aspart  3 Units Subcutaneous TID WC  . insulin detemir  35 Units Subcutaneous QHS  . iron polysaccharides  150 mg Oral Daily  . metFORMIN  500 mg Oral BID WC  . multivitamin with minerals  1 tablet Oral Daily  . nutrition supplement (JUVEN)  1 packet Oral BID BM  . pantoprazole  80 mg Oral Q1200  . potassium chloride  20 mEq Oral BID  . pregabalin  75 mg Oral BID  . senna  1 tablet Oral BID  . silver sulfADIAZINE  1 application Topical BID   Continuous Infusions: . lactated ringers 50 mL/hr (11/09/13 1244)   PRN Meds:.diphenhydrAMINE, methocarbamol (ROBAXIN) IV, methocarbamol, metoCLOPramide (REGLAN) injection, metoCLOPramide,  ondansetron (ZOFRAN) IV, ondansetron, oxyCODONE, polyethylene glycol, sorbitol, traMADol  Assessment/Plan: Principal Problem:   Foot osteomyelitis, left Active Problems:   SYSTOLIC HEART FAILURE, CHRONIC   HTN (hypertension)   PAD (peripheral artery disease)   Hyperlipidemia   PAF (paroxysmal atrial fibrillation)   Gangrenous toe, Lt toe   Type II or unspecified type diabetes mellitus   Acute blood loss anemia  Active Problems:  SYSTOLIC HEART FAILURE, CHRONIC  - Currently compensated, no shortness of breath or orthopnea. IVF at [facility]. 2-D echo in June 2014 had shown EF of 20-25% with diffuse hypokinesis grade 1 diastolic dysfunction  - His cardiologist is Dr. Caryl Comes.  - Lasix continue qd for now- until good po intake then go back to BID. Follow I.'s and O's  and BUN and creatinine- Continue aspirin statin, beta blocker, Plavix. Aldactone on hold- need to restart when BP is rising HTN (hypertension)  - Continue Coreg,  PAD (peripheral artery disease)  - Continue aspirin, statin, Plavix  Hyperlipidemia  - Restarted statins  PAF (paroxysmal atrial fibrillation)  - Continue Coreg, currently on aspirin and Plavix  - Not on any anticoagulation  Type II or unspecified type diabetes mellitus reasonable control  - Patient is on 45 units Levemir at bedtime as outpatient. - titrate insulin up as po intake improves - 35 unit for now- increased SSI, restarted metformin  Nausea- Morphine stopped; Tramadol for moderate pain; and oxycodone for severe pain.  S/p left leg amputation- per ortho.  Low HGB-transfused 2U PRRBC with appropriate rise in Hgb.  Disposition CIR    LOS: 5 days   [name], MD 11/14/2013, 7:26 AM

## 2013-11-15 ENCOUNTER — Inpatient Hospital Stay (HOSPITAL_COMMUNITY): Payer: Medicare Other | Admitting: Rehabilitation

## 2013-11-15 ENCOUNTER — Inpatient Hospital Stay (HOSPITAL_COMMUNITY): Payer: Medicare Other | Admitting: Speech Pathology

## 2013-11-15 ENCOUNTER — Inpatient Hospital Stay (HOSPITAL_COMMUNITY): Payer: Medicare Other | Admitting: Occupational Therapy

## 2013-11-15 DIAGNOSIS — L98499 Non-pressure chronic ulcer of skin of other sites with unspecified severity: Secondary | ICD-10-CM

## 2013-11-15 DIAGNOSIS — S88119A Complete traumatic amputation at level between knee and ankle, unspecified lower leg, initial encounter: Secondary | ICD-10-CM

## 2013-11-15 DIAGNOSIS — I739 Peripheral vascular disease, unspecified: Secondary | ICD-10-CM

## 2013-11-15 DIAGNOSIS — I5022 Chronic systolic (congestive) heart failure: Secondary | ICD-10-CM

## 2013-11-15 DIAGNOSIS — M869 Osteomyelitis, unspecified: Secondary | ICD-10-CM

## 2013-11-15 DIAGNOSIS — E1142 Type 2 diabetes mellitus with diabetic polyneuropathy: Secondary | ICD-10-CM

## 2013-11-15 DIAGNOSIS — E1149 Type 2 diabetes mellitus with other diabetic neurological complication: Secondary | ICD-10-CM

## 2013-11-15 LAB — GLUCOSE, CAPILLARY
GLUCOSE-CAPILLARY: 121 mg/dL — AB (ref 70–99)
GLUCOSE-CAPILLARY: 145 mg/dL — AB (ref 70–99)
GLUCOSE-CAPILLARY: 176 mg/dL — AB (ref 70–99)
Glucose-Capillary: 127 mg/dL — ABNORMAL HIGH (ref 70–99)

## 2013-11-15 LAB — MDC_IDC_ENUM_SESS_TYPE_REMOTE
Brady Statistic RA Percent Paced: 0 %
Brady Statistic RV Percent Paced: 99 %
HIGH POWER IMPEDANCE MEASURED VALUE: 44 Ohm
Implantable Pulse Generator Serial Number: 587847
Lead Channel Impedance Value: 502 Ohm
Lead Channel Impedance Value: 552 Ohm
Lead Channel Setting Pacing Amplitude: 2.4 V
Lead Channel Setting Pacing Pulse Width: 0.4 ms
Lead Channel Setting Sensing Sensitivity: 0.5 mV
Lead Channel Setting Sensing Sensitivity: 1 mV
MDC IDC MSMT LEADCHNL RA IMPEDANCE VALUE: 522 Ohm
MDC IDC MSMT LEADCHNL RA SENSING INTR AMPL: 3.9 mV
MDC IDC SET LEADCHNL LV PACING AMPLITUDE: 2.5 V
MDC IDC SET LEADCHNL RA PACING AMPLITUDE: 2 V
MDC IDC SET LEADCHNL RV PACING PULSEWIDTH: 0.4 ms
MDC IDC SET ZONE DETECTION INTERVAL: 343 ms
Zone Setting Detection Interval: 250 ms
Zone Setting Detection Interval: 300 ms

## 2013-11-15 LAB — CBC WITH DIFFERENTIAL/PLATELET
BASOS PCT: 0 % (ref 0–1)
Basophils Absolute: 0 10*3/uL (ref 0.0–0.1)
EOS ABS: 0.3 10*3/uL (ref 0.0–0.7)
Eosinophils Relative: 5 % (ref 0–5)
HEMATOCRIT: 30.4 % — AB (ref 39.0–52.0)
HEMOGLOBIN: 9.9 g/dL — AB (ref 13.0–17.0)
Lymphocytes Relative: 13 % (ref 12–46)
Lymphs Abs: 0.8 10*3/uL (ref 0.7–4.0)
MCH: 23.7 pg — AB (ref 26.0–34.0)
MCHC: 32.6 g/dL (ref 30.0–36.0)
MCV: 72.9 fL — ABNORMAL LOW (ref 78.0–100.0)
MONO ABS: 0.3 10*3/uL (ref 0.1–1.0)
Monocytes Relative: 5 % (ref 3–12)
Neutro Abs: 5 10*3/uL (ref 1.7–7.7)
Neutrophils Relative %: 77 % (ref 43–77)
Platelets: 286 10*3/uL (ref 150–400)
RBC: 4.17 MIL/uL — ABNORMAL LOW (ref 4.22–5.81)
RDW: 18.2 % — AB (ref 11.5–15.5)
WBC: 6.4 10*3/uL (ref 4.0–10.5)

## 2013-11-15 LAB — COMPREHENSIVE METABOLIC PANEL
ALBUMIN: 2.3 g/dL — AB (ref 3.5–5.2)
ALT: 15 U/L (ref 0–53)
AST: 14 U/L (ref 0–37)
Alkaline Phosphatase: 67 U/L (ref 39–117)
BILIRUBIN TOTAL: 0.3 mg/dL (ref 0.3–1.2)
BUN: 20 mg/dL (ref 6–23)
CO2: 24 mEq/L (ref 19–32)
CREATININE: 0.93 mg/dL (ref 0.50–1.35)
Calcium: 9.1 mg/dL (ref 8.4–10.5)
Chloride: 103 mEq/L (ref 96–112)
GFR calc Af Amer: >90 mL/min (ref >90)
GFR calc non Af Amer: 83 mL/min — ABNORMAL LOW (ref >90)
Glucose, Bld: 124 mg/dL — ABNORMAL HIGH (ref 70–99)
Potassium: 4.3 mEq/L (ref 3.7–5.3)
Sodium: 143 mEq/L (ref 137–147)
Total Protein: 7 g/dL (ref 6.0–8.3)

## 2013-11-15 MED ORDER — OXYCODONE HCL 5 MG PO TABS
5.0000 mg | ORAL_TABLET | ORAL | Status: DC | PRN
  Administered 2013-11-15 – 2013-11-16 (×3): 10 mg via ORAL
  Filled 2013-11-15 (×3): qty 2

## 2013-11-15 NOTE — Progress Notes (Signed)
Patient information reviewed and entered into eRehab system by Mahmoud Blazejewski, RN, CRRN, PPS Coordinator.  Information including medical coding and functional independence measure will be reviewed and updated through discharge.     Per nursing patient was given "Data Collection Information Summary for Patients in Inpatient Rehabilitation Facilities with attached "Privacy Act Statement-Health Care Records" upon admission.  

## 2013-11-15 NOTE — Care Management Note (Signed)
[facility] Individual Statement of Services  Patient Name:  [name] [name]  Date:  11/15/2013  Welcome to the [facility].  Our goal is to provide you with an individualized program based on your diagnosis and situation, designed to meet your specific needs.  With this comprehensive rehabilitation program, you will be expected to participate in at least 3 hours of rehabilitation therapies Monday-Friday, with modified therapy programming on the weekends.  Your rehabilitation program will include the following services:  Physical Therapy (PT), Occupational Therapy (OT), 24 hour per day rehabilitation nursing, Neuropsychology, Case Management (Social Worker), Rehabilitation Medicine, Nutrition Services and Pharmacy Services  Weekly team conferences will be held on Wednesday to discuss your progress.  Your Social Worker will talk with you frequently to get your input and to update you on team discussions.  Team conferences with you and your family in attendance may also be held.  Expected length of stay: 12-14 days Overall anticipated outcome: supervision/min level  Depending on your progress and recovery, your program may change. Your Social Worker will coordinate services and will keep you informed of any changes. Your Social Worker's name and contact numbers are listed  below.  The following services may also be recommended but are not provided by the [facility] will be made to provide these services after discharge if needed.  Arrangements include referral to agencies that provide these services.  Your insurance has been verified to be:  [employer]-Medicare Your primary doctor is:  Dr Josetta Huddle  Pertinent information will be shared with your doctor and your insurance company.  Social Worker:  Ovidio Kin, [place] or ([phone]  Information discussed with and copy given to patient by: Elease Hashimoto, 11/15/2013, 10:17 AM

## 2013-11-15 NOTE — Evaluation (Signed)
Occupational Therapy Assessment and Plan  Patient Details  Name: [name] [name] MRN: [id number] Date of Birth: July 13, 1946  OT Diagnosis: acute pain, cognitive deficits and muscle weakness (generalized) Rehab Potential: Rehab Potential: Good ELOS: 12-14 days   Today's Date: 11/15/2013 Time: 9924-2683 Time Calculation (min): 64 min  Problem List:  Patient Active Problem List   Diagnosis Date Noted  . S/P BKA (below knee amputation) 11/14/2013  . Acute blood loss anemia 11/11/2013  . Type II or unspecified type diabetes mellitus 11/09/2013  . Lower limb amputation, great toe 10/11/2013  . Lower limb amputation, other toe(s) 10/11/2013  . Chronic osteomyelitis of toe of left foot 10/04/2013  . Foot osteomyelitis, left 10/04/2013  . Uncontrolled pain, Lt toe 09/21/2013  . Gangrenous toe, Lt toe 09/21/2013  . PAD (peripheral artery disease) 09/13/2013  . Diabetes mellitus 09/13/2013  . Hyperlipidemia 09/13/2013  . PAF (paroxysmal atrial fibrillation) 09/13/2013  . Critical lower limb ischemia 08/30/2013  . 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 HTN (hypertension) 04/08/2011  . NICM- EF 20-25% echo 6/14 09/29/2008  . LBBB 09/29/2008  . SYSTOLIC HEART FAILURE, CHRONIC 09/29/2008    Past Medical History:  Past Medical History  Diagnosis Date  . GERD (gastroesophageal reflux disease)   . Obesity   . Cardiomyopathy, nonischemic     a. Cath 2003: mild nonobstructive CAD, EF 25% at that time.  . Hypertension   . Chronic systolic CHF (congestive heart failure)     a. NICM EF 25% dating back to at least 2003.  [name] PAF (paroxysmal atrial fibrillation)     a. Noted on ICD interrogation 2012;  b. coumadin d/c'd 01/2013.  [name] NSVT (nonsustained ventricular tachycardia)     a. Noted on ICD interrogation in 2011.  [name] LBBB (left bundle branch block)   . Kidney stone   . Critical lower limb ischemia   . High cholesterol   . PAD (peripheral artery disease)     a. 08/2013  Periph Angio/PTA: Abd Ao nl, RLE- 3v runoff, PT diff dzs, AT 90p, LLE 2v runoff, PT 100, AT 50m(diamondback ORA/chocolate balloon PTA).  . Type II diabetes mellitus   . Uncontrolled pain, Lt toe 09/21/2013  . Gangrenous toe, Lt toe 09/21/2013  . CAD (coronary artery disease)     a. Nonobstructive by cath 09/2001.  .Marland KitchenAutomatic implantable cardioverter-defibrillator in situ     a. 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  . Dementia   . Arthritis    Past Surgical History:  Past Surgical History  Procedure Laterality Date  . Lithotripsy  2001  . Cervical spine surgery  1994  . Cardiac defibrillator placement  06/2009    WITH GENERATOR REPLACED; BiV ICD  . UKoreaechocardiography  03/21/2008    EF 30-35%  . Cardiovascular stress test  03/20/2009    EF 33%  . Transluminal atherectomy tibial artery Left 09/12/2013  . Cardiac catheterization  10/01/2001    THERE WAS GLOBAL HYPOKINESIS AND EF 25%. THERE APPEARED TO BE GLOBAL DECREASE IN WALL MOTION  . Toe amputation  10/04/2013    LEFT GREAT TOE AND 4TH TOE   /   DR XErlinda Hong . Amputation Left 10/04/2013    Procedure: LEFT GREAT TOE AND SECOND TOE AMPUTATION;  Surgeon: NMarianna Payment MD;  [place]  Service: Orthopedics;  Laterality: Left;  . Colonoscopy    . Leg amputation below knee Left 11/09/2013  DR Erlinda Hong  . Amputation Left 11/09/2013    Procedure: LEFT AMPUTATION BELOW KNEE;  Surgeon: Marianna Payment, MD;  Location: [place];  Service: Orthopedics;  Laterality: Left;    Assessment & Plan Clinical Impression: Patient is a 68 y.o. year old male with recent admission to the hospital on 11/09/2013 with left foot osteomyelitis and nonhealing ulcers. Limb was not felt to be salvageable. 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 Postoperative pain management. Postoperative anemia 8.0 and transfuse 11/11/2013. Close monitoring right foot wound with right toe ulceration and blackened heel.  Patient  transferred to CIR on 11/14/2013 .    Patient currently requires max with basic self-care skills secondary to muscle weakness and decreased awareness, decreased problem solving, decreased safety awareness and decreased memory.  Prior to hospitalization, patient could complete ADLs with supervision.  Patient will benefit from skilled intervention to decrease level of assist with basic self-care skills and increase independence with basic self-care skills prior to discharge home with care partner.  Anticipate patient will require 24 hour supervision and follow up home health.  OT - End of Session Activity Tolerance: Tolerates 30+ min activity with multiple rests Endurance Deficit: Yes Endurance Deficit Description: Pt extremely fatigued nearing end of session OT Assessment Rehab Potential: Good Barriers to Discharge: Decreased caregiver support Barriers to Discharge Comments: Pt's wife works 4-5 hours a day.  Feel pt will need initial 24 hour supervision. OT Patient demonstrates impairments in the following area(s): Balance;Cognition;Endurance;Pain;Safety OT Basic ADL's Functional Problem(s): Grooming;Bathing;Dressing;Toileting OT Advanced ADL's Functional Problem(s): Simple Meal Preparation OT Transfers Functional Problem(s): Toilet OT Plan OT Intensity: Minimum of 1-2 x/day, 45 to 90 minutes OT Frequency: 5 out of 7 days OT Duration/Estimated Length of Stay: 12-14 days OT Treatment/Interventions: Balance/vestibular training;Cognitive remediation/compensation;Community reintegration;Discharge planning;Functional mobility training;DME/adaptive equipment instruction;Neuromuscular re-education;Pain management;Self Care/advanced ADL retraining;Therapeutic Exercise;UE/LE Strength taining/ROM;UE/LE Coordination activities;Therapeutic Activities OT Self Feeding Anticipated Outcome(s): independent OT Basic Self-Care Anticipated Outcome(s): supervision OT Toileting Anticipated Outcome(s):  supervision OT Bathroom Transfers Anticipated Outcome(s): supervision OT Recommendation Patient destination: Home Follow Up Recommendations: Home health OT Equipment Recommended: None recommended by OT   OT Evaluation Precautions/Restrictions  Precautions Precautions: Fall Precaution Comments: L BKA, R toe wound, blackened heel on R Restrictions Weight Bearing Restrictions: No LLE Weight Bearing: Non weight bearing  Pain Pain Assessment Pain Assessment: Faces Pain Score: 2  Faces Pain Scale: Hurts a little bit Pain Type: Surgical pain Pain Location: Leg Pain Orientation: Right Pain Descriptors / Indicators: Aching Pain Intervention(s): RN made aware Multiple Pain Sites: No Home Living/Prior Functioning Home Living Available Help at Discharge: Family;Available 24 hours/day Type of Home: House Home Access: Stairs to enter [employer] of Steps: 2 Entrance Stairs-Rails: Right Home Layout: Two level;Able to live on main level with bedroom/bathroom Alternate Level Stairs-Number of Steps: one flight Alternate Level Stairs-Rails: Right Additional Comments: has bedside commode that he places over toilet, was using RW, has w/c he was using outside of house  Lives With: Spouse Prior Function Level of Independence: Needs assistance with homemaking  Able to Take Stairs?: Yes Driving: No Vocation: Retired Leisure: Hobbies-yes (Comment) Comments: iwfe performed all cooking tasks but made sandwiches and things he could eat while she was gone to work.  He was able to perform all of his bathing and dressing but needed assistance with toilet transfer secondary to the size of the bathroom no accomodating the walker well ADL  See FIM scale for details  Vision/Perception    No deficits noted will continue  to further assess in the context of functional treatment  Cognition Overall Cognitive Status: Within Functional Limits for tasks assessed Orientation Level: Oriented to  person;Oriented to place;Oriented to situation;Oriented to time Attention: Selective;Alternating Sustained Attention: Appears intact Selective Attention: Appears intact Alternating Attention: Appears intact Memory: Impaired Memory Impairment: Decreased recall of new information Awareness: Impaired Awareness Impairment: Emergent impairment Problem Solving: Impaired Problem Solving Impairment: Functional basic Safety/Judgment: Impaired Comments: During gait with RW, pt attempted to sit prior to being near mat due to fatigue.  Provided max education on getting turned around prior to sitting.  Sensation Sensation Light Touch: Appears Intact Stereognosis: Appears Intact Hot/Cold: Appears Intact Proprioception: Appears Intact Coordination Gross Motor Movements are Fluid and Coordinated: Yes Fine Motor Movements are Fluid and Coordinated: Yes Coordination and Movement Description: Pt with increased difficulty placing RLE during sitting/standing and also during gait causing moderate to severe balance deficits.   Motor  Motor Motor: Abnormal postural alignment and control;Other (comment) Motor - Skilled Clinical Observations: Pt with forward head/rounded shoulders, posterior pelvic tilt in sitting/standing, decreased activity tolerance.  Mobility  Bed Mobility Bed Mobility: Supine to Sit Supine to Sit: 4: Min assist;HOB flat Supine to Sit Details: Verbal cues for sequencing;Verbal cues for technique;Verbal cues for precautions/safety;Manual facilitation for weight shifting Supine to Sit Details (indicate cue type and reason): Pt able to perform task however, requires max amount of time with increasing cues for hand placement in order to shift weight over to R hip to sit upright.  Finally requires assist for weight shift.  Transfers Transfers: Sit to Stand Sit to Stand: With upper extremity assist;From chair/3-in-1;2: Max assist Sit to Stand Details: Visual cues/gestures for  sequencing;Manual facilitation for weight shifting;Manual facilitation for weight bearing Sit to Stand Details (indicate cue type and reason): Pt requires more max assist when fatigued due to decreased strength and ability to shift weight foward.  Stand to Sit: 3: Mod assist;With upper extremity assist;To chair/3-in-1 Stand to Sit Details (indicate cue type and reason): Visual cues/gestures for sequencing;Manual facilitation for weight shifting  Trunk/Postural Assessment  Cervical Assessment Cervical Assessment: Exceptions to [facility] Cervical Strength Overall Cervical Strength Comments: slight cervical protraction Thoracic Assessment Thoracic Assessment: Within Functional Limits Thoracic Strength Overall Thoracic Strength Comments: rounded shoulders Lumbar Assessment Lumbar Assessment: Exceptions to [facility] Lumbar Strength Overall Lumbar Strength Comments: Pt sits with increased posterior pelvic tilt  Postural Control Postural Control: Within Functional Limits  Balance Balance Balance Assessed: Yes Static Sitting Balance Static Sitting - Balance Support: Bilateral upper extremity supported;Feet supported Static Sitting - Level of Assistance: 5: Stand by assistance Static Sitting - Comment/# of Minutes: Initially requires min assist to stabilize, however once upright, he was able to perform at supervision level.  Dynamic Sitting Balance Dynamic Sitting - Balance Support: No upper extremity supported;Feet supported Dynamic Sitting - Level of Assistance: 4: Min assist Sitting balance - Comments: Pt demonstrating difficulty reaching to his feet for dressing and bathing tasks without at least 1 UE for support. Static Standing Balance Static Standing - Balance Support: Right upper extremity supported;Left upper extremity supported Static Standing - Level of Assistance: 2: Max assist Extremity/Trunk Assessment RUE Assessment RUE Assessment: Within Functional Limits LUE Assessment LUE  Assessment: Within Functional Limits  FIM:  FIM - Grooming Grooming Steps: Wash, rinse, dry face;Wash, rinse, dry hands;Shave or apply make-up;Brush, comb hair Grooming: 5: Supervision: safety issues or verbal cues FIM - Bathing Bathing Steps Patient Completed: Chest;Right Arm;Left Arm;Abdomen;Right upper leg;Left upper leg;Right lower leg (including foot) Bathing:  3: Mod-Patient completes 5-7 [id number] parts or 50-74% FIM - Upper Body Dressing/Undressing Upper body dressing/undressing steps patient completed: Thread/unthread right sleeve of pullover shirt/dresss;Thread/unthread left sleeve of pullover shirt/dress;Put head through opening of pull over shirt/dress;Pull shirt over trunk Upper body dressing/undressing: 5: Supervision: Safety issues/verbal cues FIM - Lower Body Dressing/Undressing Lower body dressing/undressing steps patient completed: Thread/unthread right pants leg;Thread/unthread left pants leg Lower body dressing/undressing: 3: Mod-Patient completed 50-74% of tasks FIM - Toileting Toileting: 0: Activity did not occur FIM - BControl and instrumentation engineerDevices: Arm rests Bed/Chair Transfer: 4: Supine > Sit: Min A (steadying Pt. > 75%/lift 1 leg);3: Bed > Chair or W/C: Mod A (lift or lower assist) (did squat pivot to w/c)   Refer to Care Plan for Long Term Goals  Recommendations for other services: None  Discharge Criteria: Patient will be discharged from OT if patient refuses treatment 3 consecutive times without medical reason, if treatment goals not met, if there is a change in medical status, if patient makes no progress towards goals or if patient is discharged from hospital.  The above assessment, treatment plan, treatment alternatives and goals were discussed and mutually agreed upon: by patient and by family  During session worked on selfcare retraining sit to stand at the sink.  Pt able to sequence through tasks without difficulty.  Needs max assist  for all transitions sit to stand with mod instructional cueing for technique and hand placement.  Also with difficulty reaching his right foot for donning his sock and adaptive velcro shoe.  Ervin Hensley OTR/L 11/15/2013, 11:53 AM

## 2013-11-15 NOTE — Evaluation (Signed)
Speech Language Pathology Assessment and Plan  Patient Details  Name: MONTGOMERY FAVOR MRN: [id number] Date of Birth: 1946/04/26  SLP Diagnosis: Cognitive Impairments  Rehab Potential: Good ELOS: 14 days   Today's Date: 11/15/2013 Time: 0174-9449 Time Calculation (min): 53 min  Problem List:  Patient Active Problem List   Diagnosis Date Noted  . S/P BKA (below knee amputation) 11/14/2013  . Acute blood loss anemia 11/11/2013  . Type II or unspecified type diabetes mellitus 11/09/2013  . Lower limb amputation, great toe 10/11/2013  . Lower limb amputation, other toe(s) 10/11/2013  . Chronic osteomyelitis of toe of left foot 10/04/2013  . Foot osteomyelitis, left 10/04/2013  . Uncontrolled pain, Lt toe 09/21/2013  . Gangrenous toe, Lt toe 09/21/2013  . PAD (peripheral artery disease) 09/13/2013  . Diabetes mellitus 09/13/2013  . Hyperlipidemia 09/13/2013  . PAF (paroxysmal atrial fibrillation) 09/13/2013  . Critical lower limb ischemia 08/30/2013  . 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 HTN (hypertension) 04/08/2011  . NICM- EF 20-25% echo 6/14 09/29/2008  . LBBB 09/29/2008  . SYSTOLIC HEART FAILURE, CHRONIC 09/29/2008   Past Medical History:  Past Medical History  Diagnosis Date  . GERD (gastroesophageal reflux disease)   . Obesity   . Cardiomyopathy, nonischemic     a. Cath 2003: mild nonobstructive CAD, EF 25% at that time.  . Hypertension   . Chronic systolic CHF (congestive heart failure)     a. NICM EF 25% dating back to at least 2003.  [name] PAF (paroxysmal atrial fibrillation)     a. Noted on ICD interrogation 2012;  b. coumadin d/c'd 01/2013.  [name] NSVT (nonsustained ventricular tachycardia)     a. Noted on ICD interrogation in 2011.  [name] LBBB (left bundle branch block)   . Kidney stone   . Critical lower limb ischemia   . High cholesterol   . PAD (peripheral artery disease)     a. 08/2013 Periph Angio/PTA: Abd Ao nl, RLE- 3v runoff, PT diff dzs,  AT 90p, LLE 2v runoff, PT 100, AT 86m(diamondback ORA/chocolate balloon PTA).  . Type II diabetes mellitus   . Uncontrolled pain, Lt toe 09/21/2013  . Gangrenous toe, Lt toe 09/21/2013  . CAD (coronary artery disease)     a. Nonobstructive by cath 09/2001.  .Marland KitchenAutomatic implantable cardioverter-defibrillator in situ     a. 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  . Dementia   . Arthritis    Past Surgical History:  Past Surgical History  Procedure Laterality Date  . Lithotripsy  2001  . Cervical spine surgery  1994  . Cardiac defibrillator placement  06/2009    WITH GENERATOR REPLACED; BiV ICD  . UKoreaechocardiography  03/21/2008    EF 30-35%  . Cardiovascular stress test  03/20/2009    EF 33%  . Transluminal atherectomy tibial artery Left 09/12/2013  . Cardiac catheterization  10/01/2001    THERE WAS GLOBAL HYPOKINESIS AND EF 25%. THERE APPEARED TO BE GLOBAL DECREASE IN WALL MOTION  . Toe amputation  10/04/2013    LEFT GREAT TOE AND 4TH TOE   /   DR XErlinda Hong . Amputation Left 10/04/2013    Procedure: LEFT GREAT TOE AND SECOND TOE AMPUTATION;  Surgeon: NMarianna Payment MD;  [place]  Service: Orthopedics;  Laterality: Left;  . Colonoscopy    . Leg amputation below knee Left 11/09/2013    DR XErlinda Hong . Amputation Left  11/09/2013    Procedure: LEFT AMPUTATION BELOW KNEE;  Surgeon: Marianna Payment, MD;  Location: [place];  Service: Orthopedics;  Laterality: Left;    Assessment / Plan / Recommendation Clinical Impression Franciso Dierks Garringer is a 68 y.o. right-handed male with history of diabetes mellitus or peripheral neuropathy, chronic systolic congestive heart failure, PAF with implantable cardioverter defibrillator. Patient lives with his wife uses a walker and wheelchair prior to admission. Admitted 11/09/2013 with left foot osteomyelitis and non-healing ulcers. Limb was not felt to be salvageable. 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  Postoperative pain management. Postoperative anemia 8.0 and transfuse 11/11/2013. Close monitoring right foot wound with right toe ulceration and blackened heel. Bouts of confusion? Chronic induced. Physical and occupational therapy evaluations completed 11/10/2013 with recommendations for physical medicine rehabilitation consult. Patient was admitted for comprehensive rehabilitation program 11/14/13.  Orders received and cognitive-linguistic evaluation completed.  Patient demonstrates mild cognitive impairments impacting basic problem solving requesting help as needed and difficulty retrieving words in conversation as well as memories of new/short term information.  Wife also reported difficulty processing at times. These deficits impact the patient's overall safety at home and ability to safely complete functional tasks. As a result, patient would benefit from skilled SLP intervention to maximize abilities prior to discharge. Anticipate patient will require 24 hour supervision at home.    Skilled Therapeutic Interventions          SLP initiated education with wife and patient regarding retrieval strategies for memory and moments of anomia.   SLP Assessment  Patient will need skilled [place] [facility] Services during CIR admission    Recommendations  Patient destination: Home Follow up Recommendations: 24 hour supervision/assistance Equipment Recommended: None recommended by SLP    SLP Frequency 5 out of 7 days   SLP Treatment/Interventions Cognitive remediation/compensation;Cueing hierarchy;Functional tasks;Internal/external aids;Patient/family education;Speech/Language facilitation;Therapeutic Activities    Pain Pain Assessment Pain Assessment: No/denies pain Prior Functioning Cognitive/Linguistic Baseline: Baseline deficits Baseline deficit details: wife managed high level tasks Type of Home: House  Lives With: Spouse Available Help at Discharge: Family;Available 24  hours/day Vocation: Retired  Industrial/product designer Term Goals: Week 1: SLP Short Term Goal 1 (Week 1): Patient will utilize external aids to assist with recall of new information with Supervision cues SLP Short Term Goal 2 (Week 1): Patient will utilize word finding strategies with Min cues SLP Short Term Goal 3 (Week 1): Patient will request help as needed with Min question cues  See FIM for current functional status Refer to Care Plan for Long Term Goals  Recommendations for other services: None  Discharge Criteria: Patient will be discharged from SLP if patient refuses treatment 3 consecutive times without medical reason, if treatment goals not met, if there is a change in medical status, if patient makes no progress towards goals or if patient is discharged from hospital.  The above assessment, treatment plan, treatment alternatives and goals were discussed and mutually agreed upon: by patient and by family  Carmelia Roller., [place]  [place] 11/15/2013, 4:53 PM

## 2013-11-15 NOTE — Evaluation (Addendum)
Physical Therapy Assessment and Plan  Patient Details  Name: [name] [name] MRN: [id number] Date of Birth: [birth year]  PT Diagnosis: Abnormal posture, Abnormality of gait, Difficulty walking and Muscle weakness Rehab Potential: Good ELOS: 12-14 days   Today's Date: 11/15/2013 Time: 1324-4010 and 2725-3664 Time Calculation (min): 59 min and 25 mins  Problem List:  Patient Active Problem List   Diagnosis Date Noted  . S/P BKA (below knee amputation) 11/14/2013  . Acute blood loss anemia 11/11/2013  . Type II or unspecified type diabetes mellitus 11/09/2013  . Lower limb amputation, great toe 10/11/2013  . Lower limb amputation, other toe(s) 10/11/2013  . Chronic osteomyelitis of toe of left foot 10/04/2013  . Foot osteomyelitis, left 10/04/2013  . Uncontrolled pain, Lt toe 09/21/2013  . Gangrenous toe, Lt toe 09/21/2013  . PAD (peripheral artery disease) 09/13/2013  . Diabetes mellitus 09/13/2013  . Hyperlipidemia 09/13/2013  . PAF (paroxysmal atrial fibrillation) 09/13/2013  . Critical lower limb ischemia 08/30/2013  . 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 HTN (hypertension) 04/08/2011  . NICM- EF 20-25% echo 6/14 09/29/2008  . LBBB 09/29/2008  . SYSTOLIC HEART FAILURE, CHRONIC 09/29/2008    Past Medical History:  Past Medical History  Diagnosis Date  . GERD (gastroesophageal reflux disease)   . Obesity   . Cardiomyopathy, nonischemic     a. Cath 2003: mild nonobstructive CAD, EF 25% at that time.  . Hypertension   . Chronic systolic CHF (congestive heart failure)     a. NICM EF 25% dating back to at least 2003.  [name] PAF (paroxysmal atrial fibrillation)     a. Noted on ICD interrogation 2012;  b. coumadin d/c'd 01/2013.  [name] NSVT (nonsustained ventricular tachycardia)     a. Noted on ICD interrogation in 2011.  [name] LBBB (left bundle branch block)   . Kidney stone   . Critical lower limb ischemia   . High cholesterol   . PAD (peripheral artery  disease)     a. 08/2013 Periph Angio/PTA: Abd Ao nl, RLE- 3v runoff, PT diff dzs, AT 90p, LLE 2v runoff, PT 100, AT 2m (diamondback ORA/chocolate balloon PTA).  . Type II diabetes mellitus   . Uncontrolled pain, Lt toe 09/21/2013  . Gangrenous toe, Lt toe 09/21/2013  . CAD (coronary artery disease)     a. Nonobstructive by cath 09/2001.  [name] Automatic implantable cardioverter-defibrillator in situ     a. 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  . Dementia   . Arthritis    Past Surgical History:  Past Surgical History  Procedure Laterality Date  . Lithotripsy  2001  . Cervical spine surgery  1994  . Cardiac defibrillator placement  06/2009    WITH GENERATOR REPLACED; BiV ICD  . US echocardiography  03/21/2008    EF 30-35%  . Cardiovascular stress test  03/20/2009    EF 33%  . Transluminal atherectomy tibial artery Left 09/12/2013  . Cardiac catheterization  10/01/2001    THERE WAS GLOBAL HYPOKINESIS AND EF 25%. THERE APPEARED TO BE GLOBAL DECREASE IN WALL MOTION  . Toe amputation  10/04/2013    LEFT GREAT TOE AND 4TH TOE   /   DR Erlinda Hong  . Amputation Left 10/04/2013    Procedure: LEFT GREAT TOE AND SECOND TOE AMPUTATION;  Surgeon: Marianna Payment, MD;  Location: [place] [facility];  Service: Orthopedics;  Laterality: Left;  . Colonoscopy    . Leg amputation below  knee Left 11/09/2013    DR Erlinda Hong  . Amputation Left 11/09/2013    Procedure: LEFT AMPUTATION BELOW KNEE;  Surgeon: Marianna Payment, MD;  Location: [place];  Service: Orthopedics;  Laterality: Left;    Assessment & Plan Clinical Impression: Patient is a 68 y.o. year old male with history of diabetes mellitus or peripheral neuropathy, chronic systolic congestive heart failure, PAF with implantable cardioverter defibrillator. Patient lives with his wife uses a walker and wheelchair prior to admission. Admitted 11/09/2013 with left foot osteomyelitis and nonhealing ulcers. Limb was not felt to be salvageable. Underwent left  transtibial amputation 11/09/2013 per Dr.Xu. Postoperative pain management. Postoperative anemia 8.0 and transfuse 11/11/2013. Close monitoring right foot wound with right toe ulceration and blackened heel. Bouts of confusion.  Patient transferred to CIR on 11/14/2013 .   Patient currently requires max with mobility secondary to muscle weakness and decreased cardiorespiratoy endurance.  Prior to hospitalization, patient was modified independent  with mobility and lived with Spouse in a House home.  Home access is 2Stairs to enter.  Patient will benefit from skilled PT intervention to maximize safe functional mobility, minimize fall risk and decrease caregiver burden for planned discharge home with 24 hour supervision.  Anticipate patient will benefit from follow up [place] at discharge.  PT - End of Session Activity Tolerance: Tolerates 10 - 20 min activity with multiple rests Endurance Deficit: Yes Endurance Deficit Description: Pt extremely fatigued nearing end of session PT Assessment Rehab Potential: Good Barriers to Discharge Comments: pt unable to get in house with w/c, therefore will need to be able to ambulate short distances.  PT Patient demonstrates impairments in the following area(s): Balance;Motor;Safety;Skin Integrity;Pain;Endurance PT Transfers Functional Problem(s): Bed Mobility;Bed to Chair;Car PT Locomotion Functional Problem(s): Ambulation;Wheelchair Mobility;Stairs PT Plan PT Intensity: Minimum of 1-2 x/day ,45 to 90 minutes PT Frequency: 5 out of 7 days PT Duration Estimated Length of Stay: 12-14 days PT Treatment/Interventions: Ambulation/gait training;Balance/vestibular training;Discharge planning;Disease management/prevention;DME/adaptive equipment instruction;Functional mobility training;Pain management;Patient/family education;Skin care/wound management;Stair training;Therapeutic Activities;Therapeutic Exercise;UE/LE Strength taining/ROM;Wheelchair propulsion/positioning PT  Transfers Anticipated Outcome(s): supervision PT Locomotion Anticipated Outcome(s): supervision PT Recommendation Follow Up Recommendations: Home health PT;24 hour supervision/assistance Patient destination: Home Equipment Recommended: Rolling walker with 5" wheels;Wheelchair (measurements);Wheelchair cushion (measurements) Equipment Details: board to support L residual limb  Skilled Therapeutic Intervention Eval:  Initiated PT evaluation and assessment, see details below.  Also initiated education on bed mobility techniques, sit <> stand with RW with max verbal and manual facilitation for forward weight shift.  Pt at times requires mod to max assist for standing, as he has increased difficulty with R foot placement and increased anxiety with transition of hands from chair to RW.  Also began gait training with RW.  Pt only able to tolerate 10' x 1 and 8' x 1 due to increased fatigue.  Note pt demonstrates very unsafe behavior when fatigued during first rep and requires total assist to get to mat safely.  Discussed limb care and keeping LLE into extension as much as possible (provided board under w/c cushion for increased support), also gave pt task of performing knee press to also increase knee ext.  Pt verbalized understanding.  Also discussed ELOS, expected outcomes, rehab schedule and safety.  Pt verbalized understanding.    Second AM session:  Pt received sitting in w/c, having just finished with OT session.  Wife now present in room.  Discussed with her ELOS and expected outcomes based on current levels of mobility.   Wife verbalized  understanding.  Wife present during PT session.  Worked on functional transfers with RW, as well as bed mobility in ADL apt to better simulate home environment.  Pt performed stand pivot transfer at max assist and mod/max verbal cues for increased forward weight shift.  Once sitting on EOB, performed supine<>sit at supervision level.  Again, once standing from bed,  requires max assist with cues for using increased forward momentum, however pt states "let me just try and do it."  Pt used front of RW to stand, however did demonstrate increased forward weight shift.  Required mod assist for safety.  Also continue to cue pt to reach back with both hands to sit for increased safety.  He was able to do so second rep.  Returned pt to room and transferred to recliner with RW as stated above.  Pt left in recliner with wife present.  All needs in reach.    PT Evaluation Precautions/Restrictions Precautions Precautions: Fall Precaution Comments: L BKA, R toe wound, blackened heel on R Restrictions Weight Bearing Restrictions: Yes LLE Weight Bearing: Non weight bearing General   Vital SignsTherapy Vitals Temp: 97.7 F (36.5 C) Temp src: Oral Pulse Rate: 88 Resp: 17 BP: 119/60 mmHg Patient Position, if appropriate: Lying Oxygen Therapy SpO2: 99 % O2 Device: None (Room air) Pain Pain Assessment Pain Assessment: 0-10 Pain Score: 5  Pain Type: Surgical pain Pain Location: Leg Pain Orientation: Left Pain Descriptors / Indicators: Aching Pain Intervention(s): RN made aware Home Living/Prior Functioning Home Living Available Help at Discharge: Family;Available 24 hours/day Type of Home: House Home Access: Stairs to enter [employer] of Steps: 2 Entrance Stairs-Rails: Right Home Layout: Two level;Able to live on main level with bedroom/bathroom Additional Comments: has bedside commode that he places over toilet, was using RW, has w/c he was using outside of house  Lives With: Spouse Prior Function Level of Independence: Needs assistance with ADLs  Able to Take Stairs?: Yes Driving: No Vocation: Retired Leisure: Hobbies-yes (Comment) (likes to be outside, play with grandson) Comments: wife was assisting with bathing, dressing, housework, cooking Vision/Perception   See OT note Cognition Overall Cognitive Status: Within Functional Limits  for tasks assessed Orientation Level: Oriented to person;Oriented to place;Oriented to situation;Oriented to time Attention: Selective;Alternating Sustained Attention: Appears intact Selective Attention: Appears intact Alternating Attention: Appears intact Memory: Impaired Memory Impairment: Decreased recall of new information Awareness: Impaired Awareness Impairment: Emergent impairment Problem Solving: Impaired Problem Solving Impairment: Functional basic Safety/Judgment: Impaired Comments: During gait with RW, pt attempted to sit prior to being near mat due to fatigue.  Provided max education on getting turned around prior to sitting.  Sensation Sensation Light Touch: Appears Intact Stereognosis: Not tested Hot/Cold: Not tested Proprioception: Appears Intact Coordination Gross Motor Movements are Fluid and Coordinated: No Coordination and Movement Description: Pt with increased difficulty placing RLE during sitting/standing and also during gait causing moderate to severe balance deficits.   Motor  Motor Motor: Abnormal postural alignment and control;Other (comment) Motor - Skilled Clinical Observations: Pt with forward head/rounded shoulders, posterior pelvic tilt in sitting/standing, decreased activity tolerance, decreased coordination in RLE, and decreased awareness on how to correct R foot placement.   Mobility Bed Mobility Bed Mobility: Supine to Sit Supine to Sit: 4: Min assist;HOB flat Supine to Sit Details: Verbal cues for sequencing;Verbal cues for technique;Verbal cues for precautions/safety;Manual facilitation for weight shifting Supine to Sit Details (indicate cue type and reason): Pt able to perform task however, requires max amount of time with increasing  cues for hand placement in order to shift weight over to R hip to sit upright.  Finally requires assist for weight shift.  Transfers Transfers: Yes Sit to Stand: 3: Mod assist;2: Max assist Sit to Stand Details:  Verbal cues for sequencing;Verbal cues for technique;Verbal cues for precautions/safety;Verbal cues for safe use of DME/AE;Manual facilitation for weight shifting;Manual facilitation for placement;Manual facilitation for weight bearing Sit to Stand Details (indicate cue type and reason): Pt requires more max assist when fatigued due to decreased strength and ability to shift weight foward.  Stand to Sit: 3: Mod assist Stand to Sit Details (indicate cue type and reason): Verbal cues for sequencing;Verbal cues for technique;Verbal cues for precautions/safety;Manual facilitation for weight shifting;Verbal cues for safe use of DME/AE Squat Pivot Transfers: 3: Mod assist;2: Max Teacher, English as a foreign language Transfer Details: Verbal cues for sequencing;Verbal cues for technique;Verbal cues for precautions/safety;Verbal cues for gait pattern;Verbal cues for safe use of DME/AE;Manual facilitation for weight shifting;Manual facilitation for placement Locomotion  Ambulation Ambulation: Yes Ambulation/Gait Assistance: 1: +1 Total assist;2: Max assist Ambulation Distance (Feet): 10 Feet (then another 8') Assistive device: Rolling walker Ambulation/Gait Assistance Details: Verbal cues for sequencing;Verbal cues for technique;Verbal cues for precautions/safety;Verbal cues for gait pattern;Verbal cues for safe use of DME/AE;Manual facilitation for weight shifting;Manual facilitation for placement Gait Gait: Yes Gait Pattern: Impaired Gait Pattern: Step-to pattern;Trunk flexed;Lateral trunk lean to left Stairs / Additional Locomotion Stairs: No Wheelchair Mobility Wheelchair Mobility: Yes Wheelchair Assistance: 4: Advertising account executive Details: Tactile cues for initiation;Verbal cues for sequencing;Verbal cues for Marketing executive: Both upper extremities Wheelchair Parts Management: Needs assistance Distance: 85  Trunk/Postural Assessment  Cervical Assessment Cervical Assessment:  Exceptions to [facility] Cervical Strength Overall Cervical Strength Comments: forward head posture Thoracic Assessment Thoracic Assessment: Exceptions to [facility] Thoracic Strength Overall Thoracic Strength Comments: rounded shoulders Lumbar Assessment Lumbar Assessment: Exceptions to [facility] Lumbar Strength Overall Lumbar Strength Comments: Pt sits with increased posterior pelvic tilt   Balance Balance Balance Assessed: Yes Static Sitting Balance Static Sitting - Balance Support: Bilateral upper extremity supported;Feet supported (R foot supported) Static Sitting - Comment/# of Minutes: Initially requires min assist to stabilize, however once upright, he was able to perform at supervision level.  Dynamic Sitting Balance Dynamic Sitting - Balance Support: No upper extremity supported;Feet supported (R foot supported) Dynamic Sitting - Level of Assistance: 5: Stand by assistance;4: Min assist Sitting balance - Comments: Initially min assist due to L lateral lean, however once upright, able to perform reaching at stand by assist.  Static Standing Balance Static Standing - Balance Support: Bilateral upper extremity supported Static Standing - Level of Assistance: 3: Mod assist;2: Max assist Extremity Assessment      RLE Assessment RLE Assessment: Within Functional Limits (hip flexors generally weak) LLE Assessment LLE Assessment: Exceptions to [facility] LLE Strength LLE Overall Strength Comments: Pt with overall weakness in hip and knee motions, however was able to achieve knee ext by activating quads  FIM:  FIM - Control and instrumentation engineer Devices: Arm rests Bed/Chair Transfer: 4: Supine > Sit: Min A (steadying Pt. > 75%/lift 1 leg);3: Bed > Chair or W/C: Mod A (lift or lower assist) (did squat pivot to w/c) FIM - Locomotion: Wheelchair Distance: 85 Locomotion: Wheelchair: 2: Travels 50 - 149 ft with minimal assistance (Pt.>75%) FIM - Locomotion: Ambulation Locomotion:  Ambulation Assistive Devices: Administrator Ambulation/Gait Assistance: 1: +1 Total assist;2: Max assist Locomotion: Ambulation: 1: Travels less than 50 ft with total assistance/helper does  all (Pt.<25%) FIM - Locomotion: Stairs Locomotion: Stairs: 0: Activity did not occur (did not attempt due to safety)   Refer to Care Plan for Long Term Goals  Recommendations for other services: None  Discharge Criteria: Patient will be discharged from PT if patient refuses treatment 3 consecutive times without medical reason, if treatment goals not met, if there is a change in medical status, if patient makes no progress towards goals or if patient is discharged from hospital.  The above assessment, treatment plan, treatment alternatives and goals were discussed and mutually agreed upon: by patient  Denice Bors 11/15/2013, 9:57 AM

## 2013-11-15 NOTE — Progress Notes (Signed)
Social Work Assessment and Plan Social Work Assessment and Plan  Patient Details  Name: [name] [name] MRN: [id number] Date of Birth: [birth year]  Today's Date: 11/15/2013  Problem List:  Patient Active Problem List   Diagnosis Date Noted  . S/P BKA (below knee amputation) 11/14/2013  . Acute blood loss anemia 11/11/2013  . Type II or unspecified type diabetes mellitus 11/09/2013  . Lower limb amputation, great toe 10/11/2013  . Lower limb amputation, other toe(s) 10/11/2013  . Chronic osteomyelitis of toe of left foot 10/04/2013  . Foot osteomyelitis, left 10/04/2013  . Uncontrolled pain, Lt toe 09/21/2013  . Gangrenous toe, Lt toe 09/21/2013  . PAD (peripheral artery disease) 09/13/2013  . Diabetes mellitus 09/13/2013  . Hyperlipidemia 09/13/2013  . PAF (paroxysmal atrial fibrillation) 09/13/2013  . Critical lower limb ischemia 08/30/2013  . 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 HTN (hypertension) 04/08/2011  . NICM- EF 20-25% echo 6/14 09/29/2008  . LBBB 09/29/2008  . SYSTOLIC HEART FAILURE, CHRONIC 09/29/2008   Past Medical History:  Past Medical History  Diagnosis Date  . GERD (gastroesophageal reflux disease)   . Obesity   . Cardiomyopathy, nonischemic     a. Cath 2003: mild nonobstructive CAD, EF 25% at that time.  . Hypertension   . Chronic systolic CHF (congestive heart failure)     a. NICM EF 25% dating back to at least 2003.  [name] PAF (paroxysmal atrial fibrillation)     a. Noted on ICD interrogation 2012;  b. coumadin d/c'd 01/2013.  [name] NSVT (nonsustained ventricular tachycardia)     a. Noted on ICD interrogation in 2011.  [name] LBBB (left bundle branch block)   . Kidney stone   . Critical lower limb ischemia   . High cholesterol   . PAD (peripheral artery disease)     a. 08/2013 Periph Angio/PTA: Abd Ao nl, RLE- 3v runoff, PT diff dzs, AT 90p, LLE 2v runoff, PT 100, AT 77m (diamondback ORA/chocolate balloon PTA).  . Type II diabetes mellitus    . Uncontrolled pain, Lt toe 09/21/2013  . Gangrenous toe, Lt toe 09/21/2013  . CAD (coronary artery disease)     a. Nonobstructive by cath 09/2001.  [name] Automatic implantable cardioverter-defibrillator in situ     a. 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  . Dementia   . Arthritis    Past Surgical History:  Past Surgical History  Procedure Laterality Date  . Lithotripsy  2001  . Cervical spine surgery  1994  . Cardiac defibrillator placement  06/2009    WITH GENERATOR REPLACED; BiV ICD  . US echocardiography  03/21/2008    EF 30-35%  . Cardiovascular stress test  03/20/2009    EF 33%  . Transluminal atherectomy tibial artery Left 09/12/2013  . Cardiac catheterization  10/01/2001    THERE WAS GLOBAL HYPOKINESIS AND EF 25%. THERE APPEARED TO BE GLOBAL DECREASE IN WALL MOTION  . Toe amputation  10/04/2013    LEFT GREAT TOE AND 4TH TOE   /   DR Erlinda Hong  . Amputation Left 10/04/2013    Procedure: LEFT GREAT TOE AND SECOND TOE AMPUTATION;  Surgeon: Marianna Payment, MD;  Location: [place];  Service: Orthopedics;  Laterality: Left;  . Colonoscopy    . Leg amputation below knee Left 11/09/2013    DR Erlinda Hong  . Amputation Left 11/09/2013    Procedure: LEFT AMPUTATION BELOW KNEE;  Surgeon: Marianna Payment, MD;  Location: [place];  Service: Orthopedics;  Laterality: Left;   Social History:  reports that he has never smoked. He has never used smokeless tobacco. He reports that he does not drink alcohol or use illicit drugs.  Family / Support Systems Marital Status: Married Patient Roles: Spouse;Parent Spouse/Significant Other: Enid Derry  [phone]  [phone] Children: Daughter in the home with her children Other Supports: Other children Anticipated Caregiver: Wife is working on getting someone to stay with him while she works Ability/Limitations of Caregiver: Wife works 12-5pm as a Arts administrator: Other (Comment) (Wife working on 24 hr care) Family  Dynamics: Close knit family who are supportive and involved with pt.  Wife, daughter are at home with him but also work outside the home.  They need to come up with a plan for pt at discharge.  Social History Preferred language: English Religion: Baptist Cultural Background: No issues Education: [employer] Read: Yes Write: Yes Employment Status: Retired Freight forwarder Issues: No issues Guardian/Conservator: None-according to MD pt is capable of making his own decisions while here.  Will also include wife due to pt somewhat confused.   Abuse/Neglect Physical Abuse: Denies Verbal Abuse: Denies Sexual Abuse: Denies Exploitation of patient/patient's resources: Denies Self-Neglect: Denies  Emotional Status Pt's affect, behavior adn adjustment status: Pt is motivated but weak from surgery.  He wants to do for himself and will try while here, but has both legs involved.  He will work hard and do his best.  Wife reports: " He has always worked hard." Recent Psychosocial Issues: Multiple medical issues-being managed by MD Pyschiatric History: No issues-diagnosis of dementia-wife feels it is mild but has been confused more since his surgery.  Deferred depression screen due to confused and being tired from therapies.  Will reassess throughout his stay Substance Abuse History: No issues  Patient / Family Perceptions, Expectations & Goals Pt/Family understanding of illness & functional limitations: Pt and wife have a basic understanding of his surgery and issues involved with it.  His wife has spoken with MD and feels her questions and concerns have been addressed.  She realizes he has many medical issues.  Pt allows wife to answer and address questions. Premorbid pt/family roles/activities: Husband, Father, grandfather, Retiree, Church member, etc Anticipated changes in roles/activities/participation: resume Pt/family expectations/goals: Pt states; " I will do my best, but it's hard  with both legs."  Wife states; " I hope he will do well here, he has been through a lot recently."  [employer]: Other (Comment) (Had in past) Premorbid Home Care/DME Agencies: Other (Comment) (had in past) Transportation available at discharge: family Resource referrals recommended: Support group (specify) (Amputee Support group)  Discharge Planning Living Arrangements: Spouse/significant other;Children Support Systems: Spouse/significant other;Children;Other relatives;Friends/neighbors;Church/faith community Type of Residence: Private residence Insurance Resources: Multimedia programmer (specify) Primary school teacher) Financial Resources: Fish farm manager;Family Support Financial Screen Referred: No Living Expenses: Lives with family Money Management: Spouse Does the patient have any problems obtaining your medications?: No Home Management: Wife and daughter Patient/Family Preliminary Plans: Return home with wife and daughter, wife trying to come up with 24 hour care at discharge.  Pt has gone to [facility] before with other health issues.  They are aware of that option.  Informed wife pt's goals are supervision level from here. Encouraged her to particiapte in therapies with pt. Social Work Anticipated Follow Up Needs: HH/OP;SNF;Support Group  Clinical Impression Pleasant gentleman who is motivated to improve but has multiple medical issues to deal with.  His  wife is supportive but is needing to come up with 24 hour care at discharge, since she Works.  Pt has been to a NH in the past, will work with wife on safe discharge plan.  Elease Hashimoto 11/15/2013, 2:17 PM

## 2013-11-15 NOTE — Progress Notes (Signed)
68 y.o. right-handed male with history of diabetes mellitus or peripheral neuropathy, chronic systolic congestive heart failure, PAF with implantable cardioverter defibrillator. Patient lives with his wife uses a walker and wheelchair prior to admission. Admitted 11/09/2013 with left foot osteomyelitis and nonhealing ulcers. Limb was not felt to be salvageable. 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 Postoperative pain management. Postoperative anemia 8.0 and transfuse 11/11/2013. Close monitoring right foot wound with right toe ulceration and blackened heel. Bouts of confusion  Subjective/Complaints: "want a shave" No pain c/os Doesn't remember me from consult last week  Review of Systems - Negative except Left stump pain  Objective: Vital Signs: Blood pressure 112/57, pulse 90, temperature 97.7 F (36.5 C), temperature source Oral, resp. rate 17, height 5\' 9"  (1.753 m), weight 83.4 kg (183 lb 13.8 oz), SpO2 99.00%. No results found. Results for orders placed during the hospital encounter of 11/14/13 (from the past 72 hour(s))  GLUCOSE, CAPILLARY     Status: Abnormal   Collection Time    11/14/13  4:31 PM      Result Value Ref Range   Glucose-Capillary 179 (*) 70 - 99 mg/dL  GLUCOSE, CAPILLARY     Status: Abnormal   Collection Time    11/14/13  9:14 PM      Result Value Ref Range   Glucose-Capillary 181 (*) 70 - 99 mg/dL     HEENT: poor dentition Cardio: irregular and no murmur Resp: CTA B/L and unlabored GI: BS positive and NT,ND Extremity:  Pulses positive and No Edema Skin:   Wound Left BK with Coban, , R lat great toe PIP area ulcer Neuro: Alert/Oriented, Cranial Nerve II-XII normal and Abnormal Motor UE 4/5 , RLE 4/5 except R ankle in PRAFO, Left HF 4/5 Musc/Skel:  Other Left BKA Gen NAD   Assessment/Plan: 1. Functional deficits secondary to Left BKA for osteomyelitis which require 3+ hours per day of interdisciplinary therapy in a comprehensive  inpatient rehab setting. Physiatrist is providing close team supervision and 24 hour management of active medical problems listed below. Physiatrist and rehab team continue to assess barriers to discharge/monitor patient progress toward functional and medical goals. FIM:                   Comprehension Comprehension Mode: Auditory Comprehension: 5-Follows basic conversation/direction: With no assist  Expression Expression Mode: Verbal Expression: 5-Expresses basic needs/ideas: With no assist  Social Interaction Social Interaction Mode: Asleep Social Interaction: 5-Interacts appropriately 90% of the time - Needs monitoring or encouragement for participation or interaction.  Problem Solving Problem Solving: 4-Solves basic 75 - 89% of the time/requires cueing 10 - 24% of the time  Memory Memory: 4-Recognizes or recalls 75 - 89% of the time/requires cueing 10 - 24% of the time  Medical Problem List and Plan:  1. 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 DVT Prophylaxis/Anticoagulation: SCDs right lower extremity  3. Pain Management: Lyrica 75 mg twice a day, Oxycodone and Robaxin as needed.  4. Neuropsych: This patient is capable of making decisions on his own behalf.  5. Acute blood loss anemia. Continue Niferex tablet daily. Followup CBC  6. Right foot wound. Right toe ulceration with blackened heel. Silvadene twice a day applied to the right toe wounds. Consider adaptive shoe for right foot (didn't see one in the room)  7. Diabetes mellitus with peripheral neuropathy. Hemoglobin A1c 8.6. Glucophage 500 mg twice a day, Levemir 35 units each bedtime and NovoLog 20 units 3 times a day. Check  blood sugars a.c. and at bedtime  8. Chronic systolic congestive heart failure. Lasix 40 mg daily. Monitor for any signs of fluid overload  9. PAF with implantable cardioverter defibrillator. Cardiac rate control. Her chest pain or shortness of breath.  10. Hypertension. Coreg 12.5  mg twice a day. Monitor the increased mobility  11. Hyperlipidemia. Lipitor   LOS (Days) 1 A FACE TO FACE EVALUATION WAS PERFORMED  KIRSTEINS,ANDREW E 11/15/2013, 6:21 AM

## 2013-11-15 NOTE — Progress Notes (Signed)
Dressing to toes of R foot changed, silvadene applied per order.

## 2013-11-16 ENCOUNTER — Inpatient Hospital Stay (HOSPITAL_COMMUNITY): Payer: Medicare Other | Admitting: Speech Pathology

## 2013-11-16 ENCOUNTER — Encounter (HOSPITAL_COMMUNITY): Payer: Medicare Other | Admitting: Occupational Therapy

## 2013-11-16 ENCOUNTER — Inpatient Hospital Stay (HOSPITAL_COMMUNITY): Payer: Medicare Other | Admitting: Rehabilitation

## 2013-11-16 LAB — GLUCOSE, CAPILLARY
GLUCOSE-CAPILLARY: 155 mg/dL — AB (ref 70–99)
Glucose-Capillary: 102 mg/dL — ABNORMAL HIGH (ref 70–99)
Glucose-Capillary: 99 mg/dL (ref 70–99)
Glucose-Capillary: 189 mg/dL — ABNORMAL HIGH (ref 70–99)

## 2013-11-16 MED ORDER — OXYCODONE HCL 5 MG PO TABS
5.0000 mg | ORAL_TABLET | ORAL | Status: DC | PRN
  Administered 2013-11-16 – 2013-11-17 (×2): 5 mg via ORAL
  Filled 2013-11-16 (×2): qty 1

## 2013-11-16 NOTE — Progress Notes (Signed)
Occupational Therapy Session Note  Patient Details  Name: [name] [name] MRN: [id number] Date of Birth: 08-10-1946  Today's Date: 11/16/2013 Time: 0263-7858 Time Calculation (min): 38 min  Short Term Goals: Week 1:  OT Short Term Goal 1 (Week 1): Pt will perform sit to stand during LB bathing with min assist. OT Short Term Goal 2 (Week 1): Pt will perform toilet transfers stand pivot with RW and min assist. OT Short Term Goal 3 (Week 1): Pt will perform all aspects of LB dressing with min assist. OT Short Term Goal 4 (Week 1): Pt will maintain standing for 2 mins with no more than min assist in order to increase endurance for toileting and selfcare tasks.  Skilled Therapeutic Interventions/Progress Updates:    Pt performed bathing and dressing sit to stand at the sink.  He was able to stand this session for washing peri area and pulling pants over hips with mod assist level.  All UB selfcare with supervision.  Provided stool for pt to prop his RLE on for donning and doffing his shoe.  Pt decided that he did not want to remove his right sock for this session.  Pt needed mod instructional cueing to remember to lock his wheelchair brakes during the session prior to attempting to reach his feet or before attempted standing.  Pt reported feeling nauseas at beginning of the session but it had improved some by the end of the session.   Therapy Documentation Precautions:  Precautions Precautions: Fall Precaution Comments: L BKA, R toe wound, blackened heel on R Restrictions Weight Bearing Restrictions: No LLE Weight Bearing: Non weight bearing  Pain: Pain Assessment Faces Pain Scale: Hurts a little bit Pain Type: Surgical pain Pain Location: Leg Pain Orientation: Left Pain Intervention(s): Medication (See eMAR);Repositioned ADL: See FIM for current functional status  Therapy/Group: Individual Therapy  Aryaan Persichetti OTR/L 11/16/2013, 12:05 PM

## 2013-11-16 NOTE — Progress Notes (Signed)
Physical Therapy Session Note  Patient Details  Name: [name] [name] MRN: [id number] Date of Birth: [birth year]  Today's Date: 11/16/2013 Time: 9935-7017 Time Calculation (min): 43 min  Short Term Goals: Week 1:  PT Short Term Goal 1 (Week 1): Pt will perform bed mobility with HOB flat and without rails at supervision level in timely manner PT Short Term Goal 2 (Week 1): Pt will perform sit<> stand with RW at min assist level consistently PT Short Term Goal 3 (Week 1): Pt will perform stand pivot transfer with RW at min assist level PT Short Term Goal 4 (Week 1): Pt will perform dynamic standing balance with RW x 5 mins at min assist to increase activity tolerance to complete ADL's.  PT Short Term Goal 5 (Week 1): Pt will ambulate 14' with RW at min assist consistently while demonstrating increased safety  Skilled Therapeutic Interventions/Progress Updates:   Pt received lying in bed this morning, agreeable to therapy.  Performed bed mobility at min/guard assist with HOB flat and without use of rails.  Note increased difficulty elevating trunk into upright position.  Unsure if motor planning issue or actual UE weakness.  Once upright, provided cues for pt to scoot to EOB to make standing easier and also cues for correct placement of RLE prior to standing.  Performed standing at mod/max assist.  Pt requires mod assist initially, however due to LOB to the L from R foot moving over to the R requires max assist to regain balance.  Pt self propelled x 65' to gym using BUEs to continue to work on technique and UE strength/endurance.  Once in gym, performed seated push ups x 10 reps with demonstration cues for technique.  Transferred w/c <> mat with RW at mod assist with continued verbal and manual facilitation for increased forward weight shift and max verbal cues for transitioning hands and hand placement.  Once supine on mat, performed glute sets x 10 reps (pt with increased difficulty performing  task), SLR LLE x 10 reps, SAQ RLE x 10 reps.  Pt then asked to roll onto R side, and noted smell of urine and also noted clothes to be somewhat saturated, therefore asked pt if he could feel that he was damp, and he stated "yes" but he states that he did not tell anyone.  Educated pt on importance of letting someone know if he has had incontinence due to possible skin breakdown.  Pt verbalized understanding, however will likely need timed toileting if issue continues.  Assisted pt back to w/c as stated above and into restroom in room.  Performed standing x 2 reps at mod assist in order to assist with removing pants/brief, peri care and also to don new shorts/brief.  Pt left in w/c in room with all needs in reach.    Therapy Documentation Precautions:  Precautions Precautions: Fall Precaution Comments: L BKA, R toe wound, blackened heel on R Restrictions Weight Bearing Restrictions: Yes LLE Weight Bearing: Non weight bearing  Therapy Vitals Pulse Rate: 92 BP: 105/53 mmHg Pain: Pain Assessment Pain Score: 5      See FIM for current functional status  Therapy/Group: Individual Therapy  Denice Bors 11/16/2013, 11:17 AM

## 2013-11-16 NOTE — Progress Notes (Signed)
Physical Therapy Session Note  Patient Details  Name: [name] [name] MRN: [id number] Date of Birth: Oct 20, 1945  Today's Date: 11/16/2013 Time: 6606-3016 Time Calculation (min): 59 min  Short Term Goals: Week 1:  PT Short Term Goal 1 (Week 1): Pt will perform bed mobility with HOB flat and without rails at supervision level in timely manner PT Short Term Goal 2 (Week 1): Pt will perform sit<> stand with RW at min assist level consistently PT Short Term Goal 3 (Week 1): Pt will perform stand pivot transfer with RW at min assist level PT Short Term Goal 4 (Week 1): Pt will perform dynamic standing balance with RW x 5 mins at min assist to increase activity tolerance to complete ADL's.  PT Short Term Goal 5 (Week 1): Pt will ambulate 32' with RW at min assist consistently while demonstrating increased safety  Skilled Therapeutic Interventions/Progress Updates:   Pt received sitting in w/c in room, brother present for first half of session.  Performed 100' w/c propulsion using BUEs to increase strength and overall endurance.  Once in ortho gym, worked on functional transfers with stand pivot w/c <> mat surface x 4 reps at mod/max assist.  Pt continues to have increased difficulty with getting weight forward, forward trunk lean and transitioning hands from w/c/mat to RW.  Provided verbal, demonstration cues for correct technique, however pt continues to have increased difficulty.  Performed standing balance with removal of R hand then L hand, however had extreme difficulty with removal of L hand.  On last rep, had pt attempt ambulation, however only able to complete approx 5' before needing to sit due to increased fatigue and weakness.  Pt returned to chair and was assisted out of gym.  Pt then stated he wanted to use restroom, therefore assisted across hall to tub room.  Performed stand pivot transfer with RW at mod assist.  Pt able to adjust clothing prior to sitting, however then required max  assist to stand, performed peri care and then required assist to adjust clothing following toileting.  Returned to chair as mentioned above.  Assisted to gym and performed stand pivot transfer w/c <> nustep for seated therex x 5 mins at level 2 resistance with R LE, and BUEs.  Pt requires single rest break during activity.  Pt returned to chair and was assisted back to room. Pt left in w/c in room with all needs in reach and L residual limb supported.    Therapy Documentation Precautions:  Precautions Precautions: Fall Precaution Comments: L BKA, R toe wound, blackened heel on R Restrictions Weight Bearing Restrictions: No LLE Weight Bearing: Non weight bearing   Pain:       See FIM for current functional status  Therapy/Group: Individual Therapy  Denice Bors 11/16/2013, 4:05 PM

## 2013-11-16 NOTE — Patient Care Conference (Signed)
Inpatient RehabilitationTeam Conference and Plan of Care Update Date: 11/16/2013   Time: 11;45 AM    Patient Name: [name] Record Number: [id number]  Date of Birth: June 22, 1946 Sex: Male         Room/Bed: 4M01C/4M01C-01 Payor Info: Payor: [employer] / Plan: [employer] MEDICARE COMPLETE / Product Type: *No Product type* /    Admitting Diagnosis: R BKA  Admit Date/Time:  11/14/2013  3:43 PM Admission Comments: No comment available   Primary Diagnosis:  <principal problem not specified> Principal Problem: <principal problem not specified>  Patient Active Problem List   Diagnosis Date Noted  . S/P BKA (below knee amputation) 11/14/2013  . Acute blood loss anemia 11/11/2013  . Type II or unspecified type diabetes mellitus 11/09/2013  . Lower limb amputation, great toe 10/11/2013  . Lower limb amputation, other toe(s) 10/11/2013  . Chronic osteomyelitis of toe of left foot 10/04/2013  . Foot osteomyelitis, left 10/04/2013  . Uncontrolled pain, Lt toe 09/21/2013  . Gangrenous toe, Lt toe 09/21/2013  . PAD (peripheral artery disease) 09/13/2013  . Diabetes mellitus 09/13/2013  . Hyperlipidemia 09/13/2013  . PAF (paroxysmal atrial fibrillation) 09/13/2013  . Critical lower limb ischemia 08/30/2013  . 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 HTN (hypertension) 04/08/2011  . NICM- EF 20-25% echo 6/14 09/29/2008  . LBBB 09/29/2008  . SYSTOLIC HEART FAILURE, CHRONIC 09/29/2008    Expected Discharge Date: Expected Discharge Date: 11/28/13  Team Members Present: Physician leading conference: Dr. Alysia Penna Social Worker Present: Ovidio Kin, LCSW Nurse Present: Dorthula Nettles, RN PT Present: [name], PT;Emily Rinaldo Cloud, PT OT Present: Simonne Come, Maryella Shivers, OT SLP Present: Gunnar Fusi, SLP PPS Coordinator present : Ileana Ladd, Lelan Pons, RN, CRRN     Current Status/Progress Goal Weekly Team Focus  Medical   confusion, ? med related vs baseline  establish baseline cognitive fxn  adjust pain meds but maintain pain control   Bowel/Bladder   cont of bowel and bladder; uses urinal at night; senekot BID; LBM 3/23  remain cont of bowel and bladder  monitor for constipation/diarrhea   Swallow/Nutrition/ Hydration     WFL        ADL's   Pt is currently supervision for UB selfcare and max assist for LB selfcare.  Mod to max assist for toileting and toilet transfers as well.  overall supervision level   selfcare retraining, balance retraining, transfer training, DME and AE education, pt/family education   Mobility   Pt is currently supervision to min assist for bed mobility, mod to max assist for standing (he is unable to forward weight shift or trunk lean and is also anxious about transitioning hands), min to mod assist for gait short distances with RW, however fatigues quickly.    supervision overall, min assist stairs  transfers, activity tolerance, balance, w/c mobility, pt/family education, limb care   Communication   Min assist   Supervision   increase word retrieval strategies   Safety/Cognition/ Behavioral Observations  Mod assist   Supervision   education and use of compensatory strategies   Pain   surgical pain L BKA, PRN oxy 10mg  q3h effective  pain level < 3/10  assess pain qshift and prn, medicate as needed   Skin   L BKA with compression wrap; R heel DTI with allevyn dsg; R toe ulcerations with silvadene dsg  incision/wounds heal with no infection; no further skin breakdown  assess skin qshift and prn; change dressing as ordered; continue prafo boot to R leg      *See Care Plan and progress notes for long and short-term goals.  Barriers to Discharge: see above    Possible Resolutions to Barriers:  See above    Discharge Planning/Teaching Needs:  Home with wife who needs to arrange 24hr care-she works 12-5pm M-F.  Pt has been to a NH beofre aware of the options      Team  Discussion:  Difficulty with sequencing and following commands-reducing pain meds to see if cognition clears more. Less confused today than yesterday.  Wife working on a plan for home.  Revisions to Treatment Plan:  Speech to eval today-due to cognition    Continued Need for Acute Rehabilitation Level of Care: The patient requires daily medical management by a physician with specialized training in physical medicine and rehabilitation for the following conditions: Daily direction of a multidisciplinary physical rehabilitation program to ensure safe treatment while eliciting the highest outcome that is of practical value to the patient.: Yes Daily medical management of patient stability for increased activity during participation in an intensive rehabilitation regime.: Yes Daily analysis of laboratory values and/or radiology reports with any subsequent need for medication adjustment of medical intervention for : Post surgical problems;Neurological problems  [name] [name], Gardiner Rhyme 11/16/2013, 2:50 PM

## 2013-11-16 NOTE — Progress Notes (Signed)
Social Work Patient ID: [name] [name], male   DOB: 06-04-1946, 68 y.o.   MRN: [id number] Met with pt and spoke with wife via telephone to discuss team conference goals-supervision/min level and discharge 4/6. Wife aware pt will require 24 hour supervision at discharge.  She is hopeful he will do better once pain medicines reduced. She does report he had days like this at home, some days were good and some were bad.  Will work on a safe discharge plan.

## 2013-11-16 NOTE — Progress Notes (Signed)
SLP Cancellation Note  Patient Details Name: [name] [name] MRN: [id number] DOB: 1945-12-30   Cancelled treatment:       Amount of Missed SLP Time (min): 45 Minutes                                                                                          Missed Time Reason: Patient ill (Comment) (nauseous).  Patient with ginger ale and cracker as well as emesis bucket and stated that due to nausea patient reported being unable to participate in therapies.    Gunnar Fusi, M.A., CCC-SLP [phone]  [place] 11/16/2013, 10:25 AM

## 2013-11-16 NOTE — Progress Notes (Signed)
68 y.o. right-handed male with history of diabetes mellitus or peripheral neuropathy, chronic systolic congestive heart failure, PAF with implantable cardioverter defibrillator. Patient lives with his wife uses a walker and wheelchair prior to admission. Admitted 11/09/2013 with left foot osteomyelitis and nonhealing ulcers. Limb was not felt to be salvageable. 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 Postoperative pain management. Postoperative anemia 8.0 and transfuse 11/11/2013. Close monitoring right foot wound with right toe ulceration and blackened heel. Bouts of confusion  Subjective/Complaints:  No pain c/os, per nsg getting 2 oxy IR $Remo'5mg'WHDEo$  Doesn't remember when he last saw me  Review of Systems - Negative except Left stump pain  Objective: Vital Signs: Blood pressure 102/52, pulse 83, temperature 98.2 F (36.8 C), temperature source Oral, resp. rate 18, height $RemoveBe'5\' 9"'SMqDmVPmh$  (1.753 m), weight 79.107 kg (174 lb 6.4 oz), SpO2 98.00%. No results found. Results for orders placed during the hospital encounter of 11/14/13 (from the past 72 hour(s))  GLUCOSE, CAPILLARY     Status: Abnormal   Collection Time    11/14/13  4:31 PM      Result Value Ref Range   Glucose-Capillary 179 (*) 70 - 99 mg/dL  GLUCOSE, CAPILLARY     Status: Abnormal   Collection Time    11/14/13  9:14 PM      Result Value Ref Range   Glucose-Capillary 181 (*) 70 - 99 mg/dL  GLUCOSE, CAPILLARY     Status: Abnormal   Collection Time    11/15/13  7:10 AM      Result Value Ref Range   Glucose-Capillary 127 (*) 70 - 99 mg/dL   Comment 1 Notify RN    CBC WITH DIFFERENTIAL     Status: Abnormal   Collection Time    11/15/13  7:30 AM      Result Value Ref Range   WBC 6.4  4.0 - 10.5 K/uL   RBC 4.17 (*) 4.22 - 5.81 MIL/uL   Hemoglobin 9.9 (*) 13.0 - 17.0 g/dL   HCT 30.4 (*) 39.0 - 52.0 %   MCV 72.9 (*) 78.0 - 100.0 fL   MCH 23.7 (*) 26.0 - 34.0 pg   MCHC 32.6  30.0 - 36.0 g/dL   RDW 18.2 (*) 11.5 - 15.5 %    Platelets 286  150 - 400 K/uL   Neutrophils Relative % 77  43 - 77 %   Neutro Abs 5.0  1.7 - 7.7 K/uL   Lymphocytes Relative 13  12 - 46 %   Lymphs Abs 0.8  0.7 - 4.0 K/uL   Monocytes Relative 5  3 - 12 %   Monocytes Absolute 0.3  0.1 - 1.0 K/uL   Eosinophils Relative 5  0 - 5 %   Eosinophils Absolute 0.3  0.0 - 0.7 K/uL   Basophils Relative 0  0 - 1 %   Basophils Absolute 0.0  0.0 - 0.1 K/uL  COMPREHENSIVE METABOLIC PANEL     Status: Abnormal   Collection Time    11/15/13  7:30 AM      Result Value Ref Range   Sodium 143  137 - 147 mEq/L   Potassium 4.3  3.7 - 5.3 mEq/L   Chloride 103  96 - 112 mEq/L   CO2 24  19 - 32 mEq/L   Glucose, Bld 124 (*) 70 - 99 mg/dL   BUN 20  6 - 23 mg/dL   Creatinine, Ser 0.93  0.50 - 1.35 mg/dL   Calcium 9.1  8.4 -  10.5 mg/dL   Total Protein 7.0  6.0 - 8.3 g/dL   Albumin 2.3 (*) 3.5 - 5.2 g/dL   AST 14  0 - 37 U/L   ALT 15  0 - 53 U/L   Alkaline Phosphatase 67  39 - 117 U/L   Total Bilirubin 0.3  0.3 - 1.2 mg/dL   GFR calc non Af Amer 83 (*) >90 mL/min   GFR calc Af Amer >90  >90 mL/min   Comment: (NOTE)     The eGFR has been calculated using the CKD EPI equation.     This calculation has not been validated in all clinical situations.     eGFR's persistently <90 mL/min signify possible Chronic Kidney     Disease.  GLUCOSE, CAPILLARY     Status: Abnormal   Collection Time    11/15/13 11:54 AM      Result Value Ref Range   Glucose-Capillary 121 (*) 70 - 99 mg/dL   Comment 1 Notify RN    GLUCOSE, CAPILLARY     Status: Abnormal   Collection Time    11/15/13  4:48 PM      Result Value Ref Range   Glucose-Capillary 145 (*) 70 - 99 mg/dL   Comment 1 Notify RN    GLUCOSE, CAPILLARY     Status: Abnormal   Collection Time    11/15/13  8:51 PM      Result Value Ref Range   Glucose-Capillary 176 (*) 70 - 99 mg/dL     HEENT: poor dentition Cardio: irregular and no murmur Resp: CTA B/L and unlabored GI: BS positive and NT,ND Extremity:   Pulses positive and No Edema Skin:   Wound Left BK with Coban, , R lat great toe PIP area ulcer, R heel without redness or breakdown Neuro: Alert/Oriented, Cranial Nerve II-XII normal and Abnormal Motor UE 4/5 , RLE 4/5 except R ankle in PRAFO, Left HF 4/5 Musc/Skel:  Other Left BKA Gen NAD   Assessment/Plan: 1. Functional deficits secondary to Left BKA for osteomyelitis which require 3+ hours per day of interdisciplinary therapy in a comprehensive inpatient rehab setting. Physiatrist is providing close team supervision and 24 hour management of active medical problems listed below. Physiatrist and rehab team continue to assess barriers to discharge/monitor patient progress toward functional and medical goals. FIM: FIM - Bathing Bathing Steps Patient Completed: Chest;Right Arm;Left Arm;Abdomen;Right upper leg;Left upper leg;Right lower leg (including foot) Bathing: 3: Mod-Patient completes 5-7 90f 10 parts or 50-74%  FIM - Upper Body Dressing/Undressing Upper body dressing/undressing steps patient completed: Thread/unthread right sleeve of pullover shirt/dresss;Thread/unthread left sleeve of pullover shirt/dress;Put head through opening of pull over shirt/dress;Pull shirt over trunk Upper body dressing/undressing: 5: Supervision: Safety issues/verbal cues FIM - Lower Body Dressing/Undressing Lower body dressing/undressing steps patient completed: Thread/unthread right pants leg;Thread/unthread left pants leg Lower body dressing/undressing: 3: Mod-Patient completed 50-74% of tasks  FIM - Toileting Toileting: 0: Activity did not occur     FIM - Control and instrumentation engineer Devices: Arm rests Bed/Chair Transfer: 4: Supine > Sit: Min A (steadying Pt. > 75%/lift 1 leg);3: Bed > Chair or W/C: Mod A (lift or lower assist) (did squat pivot to w/c)  FIM - Locomotion: Wheelchair Distance: 85 Locomotion: Wheelchair: 2: Travels 50 - 149 ft with minimal assistance  (Pt.>75%) FIM - Locomotion: Ambulation Locomotion: Ambulation Assistive Devices: Administrator Ambulation/Gait Assistance: 1: +1 Total assist;2: Max assist Locomotion: Ambulation: 1: Travels less than 50 ft with total assistance/helper  does all (Pt.<25%)  Comprehension Comprehension Mode: Auditory Comprehension: 4-Understands basic 75 - 89% of the time/requires cueing 10 - 24% of the time  Expression Expression Mode: Verbal Expression: 4-Expresses basic 75 - 89% of the time/requires cueing 10 - 24% of the time. Needs helper to occlude trach/needs to repeat words.  Social Interaction Social Interaction Mode: Asleep Social Interaction: 5-Interacts appropriately 90% of the time - Needs monitoring or encouragement for participation or interaction.  Problem Solving Problem Solving: 3-Solves basic 50 - 74% of the time/requires cueing 25 - 49% of the time  Memory Memory: 3-Recognizes or recalls 50 - 74% of the time/requires cueing 25 - 49% of the time  Medical Problem List and Plan:  1. 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 DVT Prophylaxis/Anticoagulation: SCDs right lower extremity  3. Pain Management: Lyrica 75 mg twice a day, Oxycodone reduce to $RemoveB'5mg'SeJXzWBd$  and Robaxin as needed.  4. Neuropsych: This patient is capable of making decisions on his own behalf.Still confused , will reduce oxy IR  5. Acute blood loss anemia. Continue Niferex tablet daily. Followup CBC  6. Right foot wound. Right toe ulceration with blackened heel. Silvadene twice a day applied to the right toe wounds. Consider adaptive shoe for right foot (didn't see one in the room), heel without breakdown 7. Diabetes mellitus with peripheral neuropathy. Hemoglobin A1c 8.6. Glucophage 500 mg twice a day, Levemir 35 units each bedtime and NovoLog 20 units 3 times a day. Check blood sugars a.c. and at bedtime  8. Chronic systolic congestive heart failure. Lasix 40 mg daily. Monitor for any signs of fluid overload  9. PAF  with implantable cardioverter defibrillator. Cardiac rate control. Her chest pain or shortness of breath.  10. Hypertension. Coreg 12.5 mg twice a day. Monitor the increased mobility  11. Hyperlipidemia. Lipitor   LOS (Days) 2 A FACE TO FACE EVALUATION WAS PERFORMED  KIRSTEINS,ANDREW E 11/16/2013, 7:10 AM

## 2013-11-17 ENCOUNTER — Inpatient Hospital Stay (HOSPITAL_COMMUNITY): Payer: Medicare Other | Admitting: Speech Pathology

## 2013-11-17 ENCOUNTER — Inpatient Hospital Stay (HOSPITAL_COMMUNITY): Payer: Medicare Other | Admitting: Rehabilitation

## 2013-11-17 ENCOUNTER — Encounter (HOSPITAL_COMMUNITY): Payer: Medicare Other | Admitting: Occupational Therapy

## 2013-11-17 DIAGNOSIS — E1142 Type 2 diabetes mellitus with diabetic polyneuropathy: Secondary | ICD-10-CM

## 2013-11-17 DIAGNOSIS — E1149 Type 2 diabetes mellitus with other diabetic neurological complication: Secondary | ICD-10-CM

## 2013-11-17 DIAGNOSIS — I5022 Chronic systolic (congestive) heart failure: Secondary | ICD-10-CM

## 2013-11-17 DIAGNOSIS — M869 Osteomyelitis, unspecified: Secondary | ICD-10-CM

## 2013-11-17 DIAGNOSIS — L98499 Non-pressure chronic ulcer of skin of other sites with unspecified severity: Secondary | ICD-10-CM

## 2013-11-17 DIAGNOSIS — S88119A Complete traumatic amputation at level between knee and ankle, unspecified lower leg, initial encounter: Secondary | ICD-10-CM

## 2013-11-17 DIAGNOSIS — I739 Peripheral vascular disease, unspecified: Secondary | ICD-10-CM

## 2013-11-17 LAB — GLUCOSE, CAPILLARY
GLUCOSE-CAPILLARY: 65 mg/dL — AB (ref 70–99)
GLUCOSE-CAPILLARY: 71 mg/dL (ref 70–99)
Glucose-Capillary: 169 mg/dL — ABNORMAL HIGH (ref 70–99)
Glucose-Capillary: 85 mg/dL (ref 70–99)
Glucose-Capillary: 174 mg/dL — ABNORMAL HIGH (ref 70–99)

## 2013-11-17 MED ORDER — OXYCODONE HCL 5 MG PO TABS
10.0000 mg | ORAL_TABLET | ORAL | Status: DC | PRN
  Administered 2013-11-17 – 2013-11-22 (×5): 10 mg via ORAL
  Filled 2013-11-17 (×5): qty 2

## 2013-11-17 MED ORDER — SENNA 8.6 MG PO TABS
2.0000 | ORAL_TABLET | Freq: Two times a day (BID) | ORAL | Status: DC
  Administered 2013-11-17 – 2013-11-28 (×22): 17.2 mg via ORAL
  Filled 2013-11-17 (×23): qty 2

## 2013-11-17 MED ORDER — DOCUSATE SODIUM 100 MG PO CAPS
100.0000 mg | ORAL_CAPSULE | Freq: Two times a day (BID) | ORAL | Status: DC
  Administered 2013-11-17 – 2013-11-28 (×22): 100 mg via ORAL
  Filled 2013-11-17 (×24): qty 1

## 2013-11-17 NOTE — Progress Notes (Signed)
68 y.o. right-handed male with history of diabetes mellitus or peripheral neuropathy, chronic systolic congestive heart failure, PAF with implantable cardioverter defibrillator. Patient lives with his wife uses a walker and wheelchair prior to admission. Admitted 11/09/2013 with left foot osteomyelitis and nonhealing ulcers. Limb was not felt to be salvageable. 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 Postoperative pain management. Postoperative anemia 8.0 and transfuse 11/11/2013. Close monitoring right foot wound with right toe ulceration and blackened heel. Bouts of confusion  Subjective/Complaints:  Increased stump pain but brighter and oriented time   Review of Systems - Negative except Left stump pain  Objective: Vital Signs: Blood pressure 103/60, pulse 88, temperature 98.1 F (36.7 C), temperature source Oral, resp. rate 18, height _0  (1.753 m), weight 76.114 kg (167 lb 12.8 oz), SpO2 100.00%. No results found. Results for orders placed during the hospital encounter of 11/14/13 (from the past 72 hour(s))  GLUCOSE, CAPILLARY     Status: Abnormal   Collection Time    11/14/13  4:31 PM      Result Value Ref Range   Glucose-Capillary 179 (*) 70 - 99 mg/dL  GLUCOSE, CAPILLARY     Status: Abnormal   Collection Time    11/14/13  9:14 PM      Result Value Ref Range   Glucose-Capillary 181 (*) 70 - 99 mg/dL  GLUCOSE, CAPILLARY     Status: Abnormal   Collection Time    11/15/13  7:10 AM      Result Value Ref Range   Glucose-Capillary 127 (*) 70 - 99 mg/dL   Comment 1 Notify RN    CBC WITH DIFFERENTIAL     Status: Abnormal   Collection Time    11/15/13  7:30 AM      Result Value Ref Range   WBC 6.4  4.0 - 10.5 K/uL   RBC 4.17 (*) 4.22 - 5.81 MIL/uL   Hemoglobin 9.9 (*) 13.0 - 17.0 g/dL   HCT 30.4 (*) 39.0 - 52.0 %   MCV 72.9 (*) 78.0 - 100.0 fL   MCH 23.7 (*) 26.0 - 34.0 pg   MCHC 32.6  30.0 - 36.0 g/dL   RDW 18.2 (*) 11.5 - 15.5 %   Platelets 286  150 -  400 K/uL   Neutrophils Relative % 77  43 - 77 %   Neutro Abs 5.0  1.7 - 7.7 K/uL   Lymphocytes Relative 13  12 - 46 %   Lymphs Abs 0.8  0.7 - 4.0 K/uL   Monocytes Relative 5  3 - 12 %   Monocytes Absolute 0.3  0.1 - 1.0 K/uL   Eosinophils Relative 5  0 - 5 %   Eosinophils Absolute 0.3  0.0 - 0.7 K/uL   Basophils Relative 0  0 - 1 %   Basophils Absolute 0.0  0.0 - 0.1 K/uL  COMPREHENSIVE METABOLIC PANEL     Status: Abnormal   Collection Time    11/15/13  7:30 AM      Result Value Ref Range   Sodium 143  137 - 147 mEq/L   Potassium 4.3  3.7 - 5.3 mEq/L   Chloride 103  96 - 112 mEq/L   CO2 24  19 - 32 mEq/L   Glucose, Bld 124 (*) 70 - 99 mg/dL   BUN 20  6 - 23 mg/dL   Creatinine, Ser 0.93  0.50 - 1.35 mg/dL   Calcium 9.1  8.4 - 10.5 mg/dL   Total Protein 7.0  6.0 - 8.3 g/dL   Albumin 2.3 (*) 3.5 - 5.2 g/dL   AST 14  0 - 37 U/L   ALT 15  0 - 53 U/L   Alkaline Phosphatase 67  39 - 117 U/L   Total Bilirubin 0.3  0.3 - 1.2 mg/dL   GFR calc non Af Amer 83 (*) >90 mL/min   GFR calc Af Amer >90  >90 mL/min   Comment: (NOTE)     The eGFR has been calculated using the CKD EPI equation.     This calculation has not been validated in all clinical situations.     eGFR's persistently <90 mL/min signify possible Chronic Kidney     Disease.  GLUCOSE, CAPILLARY     Status: Abnormal   Collection Time    11/15/13 11:54 AM      Result Value Ref Range   Glucose-Capillary 121 (*) 70 - 99 mg/dL   Comment 1 Notify RN    GLUCOSE, CAPILLARY     Status: Abnormal   Collection Time    11/15/13  4:48 PM      Result Value Ref Range   Glucose-Capillary 145 (*) 70 - 99 mg/dL   Comment 1 Notify RN    GLUCOSE, CAPILLARY     Status: Abnormal   Collection Time    11/15/13  8:51 PM      Result Value Ref Range   Glucose-Capillary 176 (*) 70 - 99 mg/dL  GLUCOSE, CAPILLARY     Status: Abnormal   Collection Time    11/16/13  7:24 AM      Result Value Ref Range   Glucose-Capillary 102 (*) 70 - 99 mg/dL    Comment 1 Notify RN    GLUCOSE, CAPILLARY     Status: None   Collection Time    11/16/13 11:27 AM      Result Value Ref Range   Glucose-Capillary 99  70 - 99 mg/dL   Comment 1 Notify RN    GLUCOSE, CAPILLARY     Status: Abnormal   Collection Time    11/16/13  5:07 PM      Result Value Ref Range   Glucose-Capillary 189 (*) 70 - 99 mg/dL  GLUCOSE, CAPILLARY     Status: Abnormal   Collection Time    11/16/13  8:37 PM      Result Value Ref Range   Glucose-Capillary 155 (*) 70 - 99 mg/dL     HEENT: poor dentition Cardio: irregular and no murmur Resp: CTA B/L and unlabored GI: BS positive and NT,ND Extremity:  Pulses positive and No Edema Skin:   Wound Left BK with Coban, , R lat great toe PIP area ulcer, R heel without redness or breakdown Neuro: Alert/Oriented, Cranial Nerve II-XII normal and Abnormal Motor UE 4/5 , RLE 4/5 except R ankle in PRAFO, Left HF 4/5 Musc/Skel:  Other Left BKA Gen NAD   Assessment/Plan: 1. Functional deficits secondary to Left BKA for osteomyelitis which require 3+ hours per day of interdisciplinary therapy in a comprehensive inpatient rehab setting. Physiatrist is providing close team supervision and 24 hour management of active medical problems listed below. Physiatrist and rehab team continue to assess barriers to discharge/monitor patient progress toward functional and medical goals. FIM: FIM - Bathing Bathing Steps Patient Completed: Chest;Right Arm;Left Arm;Abdomen;Right upper leg;Left upper leg;Right lower leg (including foot) Bathing: 3: Mod-Patient completes 5-7 48f10 parts or 50-74%  FIM - Upper Body Dressing/Undressing Upper body dressing/undressing steps patient completed: Thread/unthread  right sleeve of pullover shirt/dresss;Thread/unthread left sleeve of pullover shirt/dress;Put head through opening of pull over shirt/dress;Pull shirt over trunk Upper body dressing/undressing: 5: Supervision: Safety issues/verbal cues FIM - Lower Body  Dressing/Undressing Lower body dressing/undressing steps patient completed: Thread/unthread right pants leg;Thread/unthread left pants leg;Don/Doff right shoe Lower body dressing/undressing: 3: Mod-Patient completed 50-74% of tasks  FIM - Toileting Toileting: 0: Activity did not occur     FIM - Engineer, site Assistive Devices: Arm rests;Walker Bed/Chair Transfer: 4: Supine > Sit: Min A (steadying Pt. > 75%/lift 1 leg);2: Bed > Chair or W/C: Max A (lift and lower assist);2: Chair or W/C > Bed: Max A (lift and lower assist)  FIM - Locomotion: Wheelchair Distance: 85 Locomotion: Wheelchair: 2: Travels 50 - 149 ft with minimal assistance (Pt.>75%) FIM - Locomotion: Ambulation Locomotion: Ambulation Assistive Devices: Administrator Ambulation/Gait Assistance: 1: +1 Total assist;2: Max assist Locomotion: Ambulation: 1: Travels less than 50 ft with total assistance/helper does all (Pt.<25%)  Comprehension Comprehension Mode: Auditory Comprehension: 4-Understands basic 75 - 89% of the time/requires cueing 10 - 24% of the time  Expression Expression Mode: Verbal Expression: 4-Expresses basic 75 - 89% of the time/requires cueing 10 - 24% of the time. Needs helper to occlude trach/needs to repeat words.  Social Interaction Social Interaction Mode: Asleep Social Interaction: 5-Interacts appropriately 90% of the time - Needs monitoring or encouragement for participation or interaction.  Problem Solving Problem Solving: 3-Solves basic 50 - 74% of the time/requires cueing 25 - 49% of the time  Memory Memory: 3-Recognizes or recalls 50 - 74% of the time/requires cueing 25 - 49% of the time  Medical Problem List and Plan:  1. 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 DVT Prophylaxis/Anticoagulation: SCDs right lower extremity  3. Pain Management: Lyrica 75 mg twice a day, Oxycodone reduced to 17m but with inadequate pain relief will change to 134mand Robaxin  as needed.  4. Neuropsych: This patient is capable of making decisions on his own behalf.Still confused , balance pain relief with  confusion 5. Acute blood loss anemia. Continue Niferex tablet daily. Followup CBC  6. Right foot wound. Right toe ulceration  Silvadene twice a day applied to the right toe wounds. Consider adaptive shoe for right foot (didn't see one in the room), heel without breakdown 7. Diabetes mellitus with peripheral neuropathy. Hemoglobin A1c 8.6. Glucophage 500 mg twice a day, Levemir 35 units each bedtime and NovoLog 20 units 3 times a day. Check blood sugars a.c. and at bedtime  8. Chronic systolic congestive heart failure. Lasix 40 mg daily. Monitor for any signs of fluid overload  9. PAF with implantable cardioverter defibrillator. Cardiac rate control. Her chest pain or shortness of breath.  10. Hypertension. Coreg 12.5 mg twice a day. Monitor the increased mobility  11. Hyperlipidemia. Lipitor   LOS (Days) 3 A FACE TO FACE EVALUATION WAS PERFORMED  KIRSTEINS,ANDREW E 11/17/2013, 6:29 AM

## 2013-11-17 NOTE — Progress Notes (Signed)
Physical Therapy Session Note  Patient Details  Name: [name] [name] MRN: [id number] Date of Birth: 01-Sep-1945  Today's Date: 11/17/2013 Time: 4098-1191 Time Calculation (min): 40 min  Short Term Goals: Week 1:  PT Short Term Goal 1 (Week 1): Pt will perform bed mobility with HOB flat and without rails at supervision level in timely manner PT Short Term Goal 2 (Week 1): Pt will perform sit<> stand with RW at min assist level consistently PT Short Term Goal 3 (Week 1): Pt will perform stand pivot transfer with RW at min assist level PT Short Term Goal 4 (Week 1): Pt will perform dynamic standing balance with RW x 5 mins at min assist to increase activity tolerance to complete ADL's.  PT Short Term Goal 5 (Week 1): Pt will ambulate 18' with RW at min assist consistently while demonstrating increased safety  Skilled Therapeutic Interventions/Progress Updates:   Pt received sitting in w/c in room, notably slouched in chair, therefore provided max verbal cues to sit upright in chair and to also have better positioning of LLE.  Assisted pt down to therapy gym, then states he needs to use restroom, therefore assisted back to room and into restroom.  Performed stand pivot transfer with RW to toilet at mod assist.  Pt continues to demonstrate increased difficulty with transitioning hands and therefore transitioned to work on using momentum in order to make transition quicker.  This seems to work better for pt but still requires cues for R foot placement and shifting weight forward.  Pt able to adjust clothing prior to toileting, perform peri care, but did require min assist to adjust clothing prior to toileting.  Transferred back to w/c as stated above and then assisted back to therapy gym.  Then worked on very short distance ambulation as well as sit <> stand to RW.  Pt ambulated approx 8' x 2 reps with RW at min assist level with step by step cues for RW position, esp when turning to ensure he didn't  hop RLE past RW.  Continue to work on sit<> stand during session x 2 to gait training and then another 2 reps for increased carryover for technique.  Requires heavy min to light mod assist near end of session as we continued to use increased forward momentum.  During last two stands, had pt stand x 30-40 secs in order to increase upright tolerance and to work on upright posture.   Pt assisted back to room and left in w/c with all needs in reach.  Continue to re-iterate keeping L residual limb in good position with knee extended.  Pt verbalized understanding.   Therapy Documentation Precautions:  Precautions Precautions: Fall Precaution Comments: L BKA, R toe wound, blackened heel on R Restrictions Weight Bearing Restrictions: No LLE Weight Bearing: Non weight bearing   Pain: Pain Assessment Pain Assessment: 0-10 Pain Score: 6  Pain Type: Surgical pain Pain Location: Leg Pain Orientation: Left Pain Descriptors / Indicators: Aching;Throbbing Pain Frequency: Intermittent Pain Onset: Gradual Patients Stated Pain Goal: 3 Pain Intervention(s): Medication (See eMAR)     See FIM for current functional status  Therapy/Group: Individual Therapy  Denice Bors 11/17/2013, 2:33 PM

## 2013-11-17 NOTE — Progress Notes (Signed)
Speech Language Pathology Daily Session Note  Patient Details  Name: [name] [name] MRN: [id number] Date of Birth: 06-11-1946  Today's Date: 11/17/2013 Time: 0935-1020 Time Calculation (min): 45 min  Short Term Goals: Week 1: SLP Short Term Goal 1 (Week 1): Patient will utilize external aids to assist with recall of new information with Supervision cues SLP Short Term Goal 2 (Week 1): Patient will utilize word finding strategies with Min cues SLP Short Term Goal 3 (Week 1): Patient will request help as needed with Min question cues  Skilled Therapeutic Interventions: Skilled treatment session focused on addressing cognition goals.  SLP facilitated session with education/discussion regarding memory compensatory strategies.  Patient required Mod verbal cues to create external aids and patient reported that because spelling has always been difficult for him he prefers that people write note for him.  After SLP set-up external aids with large font patient required Min cues to utilize.  Patient also required Min verbal cues to utilize external aids to assist with utilizing phone to make lunch order.  Continue with current plan of care.   FIM:  Comprehension Comprehension Mode: Auditory Comprehension: 5-Follows basic conversation/direction: With extra time/assistive device Expression Expression Mode: Verbal Expression: 5-Expresses basic 90% of the time/requires cueing < 10% of the time. Social Interaction Social Interaction: 5-Interacts appropriately 90% of the time - Needs monitoring or encouragement for participation or interaction. Problem Solving Problem Solving: 4-Solves basic 75 - 89% of the time/requires cueing 10 - 24% of the time Memory Memory: 4-Recognizes or recalls 75 - 89% of the time/requires cueing 10 - 24% of the time  Pain Pain Assessment Pain Assessment: No/denies pain  Therapy/Group: Individual Therapy  Carmelia Roller.,  [id number]-SLP [phone]  [place] 11/17/2013, 10:29 AM

## 2013-11-17 NOTE — Progress Notes (Signed)
Occupational Therapy Session Note  Patient Details  Name: CORDERRO KOLOSKI MRN: [id number] Date of Birth: [birth year]  Today's Date: 11/17/2013 Time: 1104-1204 Time Calculation (min): 60 min  Short Term Goals: Week 1:  OT Short Term Goal 1 (Week 1): Pt will perform sit to stand during LB bathing with min assist. OT Short Term Goal 2 (Week 1): Pt will perform toilet transfers stand pivot with RW and min assist. OT Short Term Goal 3 (Week 1): Pt will perform all aspects of LB dressing with min assist. OT Short Term Goal 4 (Week 1): Pt will maintain standing for 2 mins with no more than min assist in order to increase endurance for toileting and selfcare tasks.  Skilled Therapeutic Interventions/Progress Updates:    Pt performed bathing and dressing during session.  First had pt ambulate to gather towels and clothing using the RW.  Pt needing overall mod assist to perform this with increased LOB X 2 requiring mod assist to correct.  On his way over to the sink pt demonstrated decreased increased fatigue and therapist had to pull wheelchair up to the pt as he could not continue.  Performed all UB selfcare with supervision.  Mod assist needed for sit to stand with LB selfcare however pt able to stand at times with only min assist.  He was able to donn and doff his right shoe and sock as well as being able to wash his right foot when it was propped up on a stool.  No complaints of significant pain during session.   Therapy Documentation Precautions:  Precautions Precautions: Fall Precaution Comments: L BKA, R toe wound, blackened heel on R Restrictions Weight Bearing Restrictions: No LLE Weight Bearing: Non weight bearing  Pain: Pain Assessment Pain Assessment: No/denies pain ADL: See FIM for current functional status  Therapy/Group: Individual Therapy  Lashon Hillier OTR/L 11/17/2013, 12:10 PM

## 2013-11-17 NOTE — Progress Notes (Signed)
Physical Therapy Session Note  Patient Details  Name: ARNEZ STONEKING MRN: [id number] Date of Birth: 1946/08/07  Today's Date: 11/17/2013 Time: 3016-0109 Time Calculation (min): 45 min  Short Term Goals: Week 1:  PT Short Term Goal 1 (Week 1): Pt will perform bed mobility with HOB flat and without rails at supervision level in timely manner PT Short Term Goal 2 (Week 1): Pt will perform sit<> stand with RW at min assist level consistently PT Short Term Goal 3 (Week 1): Pt will perform stand pivot transfer with RW at min assist level PT Short Term Goal 4 (Week 1): Pt will perform dynamic standing balance with RW x 5 mins at min assist to increase activity tolerance to complete ADL's.  PT Short Term Goal 5 (Week 1): Pt will ambulate 85' with RW at min assist consistently while demonstrating increased safety  Skilled Therapeutic Interventions/Progress Updates:   Pt received lying in bed this morning, agreeable to therapy.  Performed supine to sit at min assist level.  Pt continues to have increased difficulty sequencing task, therefore provided cues for rolling onto side prior to sitting up, however he continued to demonstrate increased difficulty using UEs to weight shift over to L hip.  Once sitting at EOB, had pt don R [name] shoe to increase independence with task, but also to increase forward trunk lean and forward weight shift.  Pt requires min assist for ensuring back velcro strip latched properly.  Performed stand pivot transfer bed>w/c at mod assist (bed elevated slightly) with continued step by step cues, however pt doing well recalling to scoot forward first, however still needs cues and assist for RLE placement and mod facilitation for increased forward weight shift.  Assisted pt down to therapy gym in order to work on sit <> stand from w/c to RW x 4 reps at [facility] assist.  Continue to provide same cues as mentioned above to increase carryover with repetition during task.  Ended session with  transfer to seated nustep as mentioned above and pt went for 4 mins (continuously today) with BUEs and RLE at level 2 resistance.  Transferred back to chair and assisted back to room.  All needs in reach.   Therapy Documentation Precautions:  Precautions Precautions: Fall Precaution Comments: L BKA, R toe wound, blackened heel on R Restrictions Weight Bearing Restrictions: Yes LLE Weight Bearing: Non weight bearing   Vital Signs: Therapy Vitals Pulse Rate: 103 BP: 105/60 mmHg Pain: Pt with slight c/o pain in L residual limb this mornign.    Other Treatments:    See FIM for current functional status  Therapy/Group: Individual Therapy  Denice Bors 11/17/2013, 11:50 AM

## 2013-11-17 NOTE — Significant Event (Signed)
Hypoglycemic Event  CBG: 65  Treatment: 15 GM carbohydrate snack  Symptoms: None  Follow-up CBG: Time:0735 CBG Result:71  Possible Reasons for Event: Unknown  Comments/MD notified:Patient drinking regular Coke and eating graham crackers with peanut butter. No c/o of hypoglycemia symptoms at this time. Will continue to monitor.    [name] [name] G  Remember to initiate Hypoglycemia Order Set & complete

## 2013-11-17 NOTE — IPOC Note (Signed)
Overall Plan of Care [facility]) Patient Details Name: [name] [name] MRN: [id number] DOB: [birth year]  Admitting Diagnosis: [facility] Problems: Active Problems:   S/P BKA (below knee amputation)     Functional Problem List: Nursing Behavior;Bladder;Bowel;Edema;Endurance;Medication Management;Motor;Nutrition;Pain;Perception;Safety;Sensory;Skin Integrity  PT Balance;Motor;Safety;Skin Integrity;Pain;Endurance  OT Balance;Cognition;Endurance;Pain;Safety  SLP Cognition;Safety;Linguistic  TR         Basic ADL's: OT Grooming;Bathing;Dressing;Toileting     Advanced  ADL's: OT Simple Meal Preparation     Transfers: PT Bed Mobility;Bed to Chair;Car  OT Toilet     Locomotion: PT Ambulation;Wheelchair Mobility;Stairs     Additional Impairments: OT    SLP Social Cognition;Communication expression Memory;Awareness;Problem Solving  TR      Anticipated Outcomes Item Anticipated Outcome  Self Feeding independent  Swallowing      Basic self-care  supervision  Toileting  supervision   Bathroom Transfers supervision  Bowel/Bladder  Remain continent of bowel and bladder with min assist  Transfers  supervision  Locomotion  supervision  Communication  Supervision   Cognition  Supervision   Pain  <3 on a 0-10 scale  Safety/Judgment  mod A   Therapy Plan: PT Intensity: Minimum of 1-2 x/day ,45 to 90 minutes PT Frequency: 5 out of 7 days PT Duration Estimated Length of Stay: 12-14 days OT Intensity: Minimum of 1-2 x/day, 45 to 90 minutes OT Frequency: 5 out of 7 days OT Duration/Estimated Length of Stay: 12-14 days SLP Intensity: Minumum of 1-2 x/day, 30 to 90 minutes SLP Frequency: 5 out of 7 days SLP Duration/Estimated Length of Stay: 14 days       Team Interventions: Nursing Interventions Patient/Family Education;Bladder Management;Bowel Management;Disease Management/Prevention;Pain Management;Medication Management;Skin Care/Wound Management;Cognitive  Remediation/Compensation;Discharge Planning;Psychosocial Support  PT interventions Ambulation/gait training;Balance/vestibular training;Discharge planning;Disease management/prevention;DME/adaptive equipment instruction;Functional mobility training;Pain management;Patient/family education;Skin care/wound management;Stair training;Therapeutic Activities;Therapeutic Exercise;UE/LE Strength taining/ROM;Wheelchair propulsion/positioning  OT Interventions Balance/vestibular training;Cognitive remediation/compensation;Community reintegration;Discharge planning;Functional mobility training;DME/adaptive equipment instruction;Neuromuscular re-education;Pain management;Self Care/advanced ADL retraining;Therapeutic Exercise;UE/LE Strength taining/ROM;UE/LE Coordination activities;Therapeutic Activities  SLP Interventions Cognitive remediation/compensation;Cueing hierarchy;Functional tasks;Internal/external aids;Patient/family education;Speech/Language facilitation;Therapeutic Activities  TR Interventions    SW/CM Interventions Discharge Planning;Psychosocial Support;Patient/Family Education    Team Discharge Planning: Destination: PT-Home ,OT- Home , SLP-Home Projected Follow-up: PT-Home health PT;24 hour supervision/assistance, OT-  Home health OT, SLP-24 hour supervision/assistance Projected Equipment Needs: PT-Rolling walker with 5" wheels;Wheelchair (measurements);Wheelchair cushion (measurements), OT- None recommended by OT, SLP-None recommended by SLP Equipment Details: PT-board to support L residual limb, OT-  Patient/family involved in discharge planning: PT- Patient,  OT-Patient;Family member/caregiver, SLP-Patient;Family member/caregiver  MD ELOS: 7-9d Medical Rehab Prognosis:  Good Assessment: 68 y.o. right-handed male with history of diabetes mellitus or peripheral neuropathy, chronic systolic congestive heart failure, PAF with implantable cardioverter defibrillator. Patient lives with his wife  uses a walker and wheelchair prior to admission. Admitted 11/09/2013 with left foot osteomyelitis and nonhealing ulcers. Limb was not felt to be salvageable. Underwent left transtibial amputation 11/09/2013 per Dr.Xu  Now requiring 24/7 Rehab RN,MD, as well as CIR level PT, OT and SLP.  Treatment team will focus on ADLs and mobility with goals set at Sup    See Team Conference Notes for weekly updates to the plan of care

## 2013-11-18 ENCOUNTER — Inpatient Hospital Stay (HOSPITAL_COMMUNITY): Payer: Medicare Other | Admitting: Occupational Therapy

## 2013-11-18 ENCOUNTER — Inpatient Hospital Stay (HOSPITAL_COMMUNITY): Payer: Medicare Other | Admitting: Speech Pathology

## 2013-11-18 ENCOUNTER — Inpatient Hospital Stay (HOSPITAL_COMMUNITY): Payer: Medicare Other | Admitting: Rehabilitation

## 2013-11-18 DIAGNOSIS — M869 Osteomyelitis, unspecified: Secondary | ICD-10-CM

## 2013-11-18 DIAGNOSIS — S88119A Complete traumatic amputation at level between knee and ankle, unspecified lower leg, initial encounter: Secondary | ICD-10-CM

## 2013-11-18 DIAGNOSIS — E1149 Type 2 diabetes mellitus with other diabetic neurological complication: Secondary | ICD-10-CM

## 2013-11-18 DIAGNOSIS — E1142 Type 2 diabetes mellitus with diabetic polyneuropathy: Secondary | ICD-10-CM

## 2013-11-18 DIAGNOSIS — I5022 Chronic systolic (congestive) heart failure: Secondary | ICD-10-CM

## 2013-11-18 DIAGNOSIS — L98499 Non-pressure chronic ulcer of skin of other sites with unspecified severity: Secondary | ICD-10-CM

## 2013-11-18 DIAGNOSIS — I739 Peripheral vascular disease, unspecified: Secondary | ICD-10-CM

## 2013-11-18 LAB — GLUCOSE, CAPILLARY
GLUCOSE-CAPILLARY: 154 mg/dL — AB (ref 70–99)
GLUCOSE-CAPILLARY: 172 mg/dL — AB (ref 70–99)
Glucose-Capillary: 78 mg/dL (ref 70–99)
Glucose-Capillary: 234 mg/dL — ABNORMAL HIGH (ref 70–99)

## 2013-11-18 NOTE — Progress Notes (Signed)
Speech Language Pathology Daily Session Note  Patient Details  Name: LANGSTON TUBERVILLE MRN: [id number] Date of Birth: [birth year]  Today's Date: 11/18/2013 Time: 1027-2536 Time Calculation (min): 43 min  Short Term Goals: Week 1: SLP Short Term Goal 1 (Week 1): Patient will utilize external aids to assist with recall of new information with Supervision cues SLP Short Term Goal 2 (Week 1): Patient will utilize word finding strategies with Min cues SLP Short Term Goal 3 (Week 1): Patient will request help as needed with Min question cues  Skilled Therapeutic Interventions: Skilled treatment session focused on addressing cognition goals.  SLP facilitated session with new learning task.  Patient required Mod verbal cues to utilize external aid to assist with recall of task procedures.  Patient demonstrated emergent awareness of difficulty but required Mod cues to come up with possible solutions.  After task patient was Mod I to recall and locate route back to room.  Continue with current plan of care.    FIM:  Comprehension Comprehension Mode: Auditory Comprehension: 5-Understands complex 90% of the time/Cues < 10% of the time Expression Expression Mode: Verbal Expression: 5-Expresses complex 90% of the time/cues < 10% of the time Social Interaction Social Interaction: 6-Interacts appropriately with others with medication or extra time (anti-anxiety, antidepressant). Problem Solving Problem Solving: 5-Solves basic 90% of the time/requires cueing < 10% of the time Memory Memory: 4-Recognizes or recalls 75 - 89% of the time/requires cueing 10 - 24% of the time  Pain Pain Assessment Pain Assessment: No/denies pain Pain Score: 4   Therapy/Group: Individual Therapy  Carmelia Roller., CCC-SLP [phone]  [place] 11/18/2013, 1:22 PM

## 2013-11-18 NOTE — Progress Notes (Signed)
Physical Therapy Session Note  Patient Details  Name: [name] [name] MRN: [id number] Date of Birth: [birth year]  Today's Date: 11/18/2013 Time: 9629-5284 Time Calculation (min): 15 min  Short Term Goals: Week 1:  PT Short Term Goal 1 (Week 1): Pt will perform bed mobility with HOB flat and without rails at supervision level in timely manner PT Short Term Goal 2 (Week 1): Pt will perform sit<> stand with RW at min assist level consistently PT Short Term Goal 3 (Week 1): Pt will perform stand pivot transfer with RW at min assist level PT Short Term Goal 4 (Week 1): Pt will perform dynamic standing balance with RW x 5 mins at min assist to increase activity tolerance to complete ADL's.  PT Short Term Goal 5 (Week 1): Pt will ambulate 41' with RW at min assist consistently while demonstrating increased safety  Skilled Therapeutic Interventions/Progress Updates:   Pt received lying in bed this morning, with pt stating he had not gotten breakfast yet.  RN called down and breakfast tray was brought to pt.  Pt agreeable to getting OOB into chair in order to eat breakfast.  Performed supine to SL to sit with HOB flat and without rails to better simulate home environment.  Pt did recall to get into SL to assist with getting up, however he continues to have difficulty using LUE to assist trunk into sitting.  Unsure if processing, memory, or strength issue.  Pt finally able to perform with mod cues and min tactile facilitation for pushing through LUE.  Once at EOB, assisted with donning [name] shoe for WB.  Performed stand pivot transfer bed >w/c at mod/max assist due to R foot moving during transfer and increased heavy leaning to the L.  Pt left in w/c with L residual limb on pad with pillows to encourage knee extension.  Breakfast in place.    Therapy Documentation Precautions:  Precautions Precautions: Fall Precaution Comments: L BKA, R toe wound, blackened heel on R Restrictions Weight Bearing  Restrictions: Yes LLE Weight Bearing: Non weight bearing General: Amount of Missed PT Time (min): 45 Minutes Missed Time Reason: Other (comment) (pt eating breakfast) Vital Signs: Therapy Vitals Temp: 98.3 F (36.8 C) Temp src: Oral Pulse Rate: 86 Resp: 18 BP: 107/63 mmHg Patient Position, if appropriate: Lying Oxygen Therapy SpO2: 97 % O2 Device: None (Room air) Pain: Pain Assessment Pain Assessment: 0-10 Pain Score: 4  Pain Type: Surgical pain Pain Location: Leg Pain Orientation: Left Pain Descriptors / Indicators: Aching;Throbbing Pain Frequency: Intermittent Pain Onset: Gradual Patients Stated Pain Goal: 3 Pain Intervention(s): Medication (See eMAR)  See FIM for current functional status  Therapy/Group: Individual Therapy  Denice Bors 11/18/2013, 9:41 AM

## 2013-11-18 NOTE — Progress Notes (Addendum)
Physical Therapy Session Note  Patient Details  Name: [name] [name] MRN: [id number] Date of Birth: February 11, 1946  Today's Date: 11/18/2013 Time: 1400-1500 Time Calculation (min): 60 min  Short Term Goals: Week 1:  PT Short Term Goal 1 (Week 1): Pt will perform bed mobility with HOB flat and without rails at supervision level in timely manner PT Short Term Goal 2 (Week 1): Pt will perform sit<> stand with RW at min assist level consistently PT Short Term Goal 3 (Week 1): Pt will perform stand pivot transfer with RW at min assist level PT Short Term Goal 4 (Week 1): Pt will perform dynamic standing balance with RW x 5 mins at min assist to increase activity tolerance to complete ADL's.  PT Short Term Goal 5 (Week 1): Pt will ambulate 42' with RW at min assist consistently while demonstrating increased safety  Skilled Therapeutic Interventions/Progress Updates:   Pt received lying in bed this afternoon, agreeable to therapy.   Performed bed mobility with HOB flat and without rails at min assist level.  Note that he continues to have increased difficulty getting into full SL and then also using LUE to self assist trunk into sitting.  Pt transferred bed >w/c with RW at [facility] assist.  Note pt better able to place RLE and also transition hands this afternoon.  Pt self propelled to gym using BUEs x 100' in order to improve technique and also to work on increasing activity tolerance.  Performed standing activity x 5 mins in order to play "connect four."  Pt able to intermittently let RUE go from RW to work on standing balance and upright tolerance.  Pt then ambulated approx 6-7' around distance of mat in order to perform supine therex.  Requires mod assist, however pt did very well with taking bigger steps with RLE.  Cues for increased glute activation and also for upright trunk.  Performed bed mobility at min/guard assist however demonstrating much improved technique when sitting up.  While in supine  completed LLE SLR x 10 reps, knee presses x 10 reps, SL hip abd x 10 reps, SL hip ext x 10 reps.  Pt sat back at [facility], and states he needs to use restroom, therefore ambulated approx 8' from mat to w/c as stated above.  Pt also continues to require assist with turning RW to ensure he does not step outside of RW.  Pt self propelled back to room as stated above and ambulated another 8' into restroom with RW.  Requires max assist due to fatigue when getting into restroom.  With max encouragement, pt able to get onto 3in1 over toilet.  Performed another stand pivot transfer to w/c at mod assist.  Nurse tech present in room to assist back to bed.    Therapy Documentation Precautions:  Precautions Precautions: Fall Precaution Comments: L BKA, R toe wound, blackened heel on R Restrictions Weight Bearing Restrictions: No LLE Weight Bearing: Non weight bearing   Pain: Pain Assessment Pain Assessment: No/denies pain    See FIM for current functional status  Therapy/Group: Individual Therapy  Denice Bors 11/18/2013, 4:34 PM

## 2013-11-18 NOTE — Progress Notes (Addendum)
68 y.o. right-handed male with history of diabetes mellitus or peripheral neuropathy, chronic systolic congestive heart failure, PAF with implantable cardioverter defibrillator. Patient lives with his wife uses a walker and wheelchair prior to admission. Admitted 11/09/2013 with left foot osteomyelitis and nonhealing ulcers. Limb was not felt to be salvageable. 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 Postoperative pain management. Postoperative anemia 8.0 and transfuse 11/11/2013. Close monitoring right foot wound with right toe ulceration and blackened heel. Bouts of confusion  Subjective/Complaints: No new issues except buttocks pain when in bed   Review of Systems - Negative except Left stump pain  Objective: Vital Signs: Blood pressure 107/59, pulse 85, temperature 98.3 F (36.8 C), temperature source Oral, resp. rate 18, height $RemoveBe'5\' 9"'[id number]$  (1.753 m), weight 76.114 kg (167 lb 12.8 oz), SpO2 97.00%. No results found. Results for orders placed during the hospital encounter of 11/14/13 (from the past 72 hour(s))  GLUCOSE, CAPILLARY     Status: Abnormal   Collection Time    11/15/13  7:10 AM      Result Value Ref Range   Glucose-Capillary 127 (*) 70 - 99 mg/dL   Comment 1 Notify RN    CBC WITH DIFFERENTIAL     Status: Abnormal   Collection Time    11/15/13  7:30 AM      Result Value Ref Range   WBC 6.4  4.0 - 10.5 K/uL   RBC 4.17 (*) 4.22 - 5.81 MIL/uL   Hemoglobin 9.9 (*) 13.0 - 17.0 g/dL   HCT 30.4 (*) 39.0 - 52.0 %   MCV 72.9 (*) 78.0 - 100.0 fL   MCH 23.7 (*) 26.0 - 34.0 pg   MCHC 32.6  30.0 - 36.0 g/dL   RDW 18.2 (*) 11.5 - 15.5 %   Platelets 286  150 - 400 K/uL   Neutrophils Relative % 77  43 - 77 %   Neutro Abs 5.0  1.7 - 7.7 K/uL   Lymphocytes Relative 13  12 - 46 %   Lymphs Abs 0.8  0.7 - 4.0 K/uL   Monocytes Relative 5  3 - 12 %   Monocytes Absolute 0.3  0.1 - 1.0 K/uL   Eosinophils Relative 5  0 - 5 %   Eosinophils Absolute 0.3  0.0 - 0.7 K/uL    Basophils Relative 0  0 - 1 %   Basophils Absolute 0.0  0.0 - 0.1 K/uL  COMPREHENSIVE METABOLIC PANEL     Status: Abnormal   Collection Time    11/15/13  7:30 AM      Result Value Ref Range   Sodium 143  137 - 147 mEq/L   Potassium 4.3  3.7 - 5.3 mEq/L   Chloride 103  96 - 112 mEq/L   CO2 24  19 - 32 mEq/L   Glucose, Bld 124 (*) 70 - 99 mg/dL   BUN 20  6 - 23 mg/dL   Creatinine, Ser 0.93  0.50 - 1.35 mg/dL   Calcium 9.1  8.4 - 10.5 mg/dL   Total Protein 7.0  6.0 - 8.3 g/dL   Albumin 2.3 (*) 3.5 - 5.2 g/dL   AST 14  0 - 37 U/L   ALT 15  0 - 53 U/L   Alkaline Phosphatase 67  39 - 117 U/L   Total Bilirubin 0.3  0.3 - 1.2 mg/dL   GFR calc non Af Amer 83 (*) >90 mL/min   GFR calc Af Amer >90  >90 mL/min   Comment: (  NOTE)     The eGFR has been calculated using the CKD EPI equation.     This calculation has not been validated in all clinical situations.     eGFR's persistently <90 mL/min signify possible Chronic Kidney     Disease.  GLUCOSE, CAPILLARY     Status: Abnormal   Collection Time    11/15/13 11:54 AM      Result Value Ref Range   Glucose-Capillary 121 (*) 70 - 99 mg/dL   Comment 1 Notify RN    GLUCOSE, CAPILLARY     Status: Abnormal   Collection Time    11/15/13  4:48 PM      Result Value Ref Range   Glucose-Capillary 145 (*) 70 - 99 mg/dL   Comment 1 Notify RN    GLUCOSE, CAPILLARY     Status: Abnormal   Collection Time    11/15/13  8:51 PM      Result Value Ref Range   Glucose-Capillary 176 (*) 70 - 99 mg/dL  GLUCOSE, CAPILLARY     Status: Abnormal   Collection Time    11/16/13  7:24 AM      Result Value Ref Range   Glucose-Capillary 102 (*) 70 - 99 mg/dL   Comment 1 Notify RN    GLUCOSE, CAPILLARY     Status: None   Collection Time    11/16/13 11:27 AM      Result Value Ref Range   Glucose-Capillary 99  70 - 99 mg/dL   Comment 1 Notify RN    GLUCOSE, CAPILLARY     Status: Abnormal   Collection Time    11/16/13  5:07 PM      Result Value Ref Range    Glucose-Capillary 189 (*) 70 - 99 mg/dL  GLUCOSE, CAPILLARY     Status: Abnormal   Collection Time    11/16/13  8:37 PM      Result Value Ref Range   Glucose-Capillary 155 (*) 70 - 99 mg/dL  GLUCOSE, CAPILLARY     Status: Abnormal   Collection Time    11/17/13  7:18 AM      Result Value Ref Range   Glucose-Capillary 65 (*) 70 - 99 mg/dL   Comment 1 Notify RN    GLUCOSE, CAPILLARY     Status: None   Collection Time    11/17/13  7:35 AM      Result Value Ref Range   Glucose-Capillary 71  70 - 99 mg/dL   Comment 1 Notify RN    GLUCOSE, CAPILLARY     Status: Abnormal   Collection Time    11/17/13 12:09 PM      Result Value Ref Range   Glucose-Capillary 169 (*) 70 - 99 mg/dL  GLUCOSE, CAPILLARY     Status: None   Collection Time    11/17/13  4:46 PM      Result Value Ref Range   Glucose-Capillary 85  70 - 99 mg/dL  GLUCOSE, CAPILLARY     Status: Abnormal   Collection Time    11/17/13  8:40 PM      Result Value Ref Range   Glucose-Capillary 174 (*) 70 - 99 mg/dL     HEENT: poor dentition Cardio: irregular and no murmur Resp: CTA B/L and unlabored GI: BS positive and NT,ND Extremity:  Pulses positive and No Edema Skin:   Wound Left BK with Coban, , R lat great toe PIP area ulcer, R heel without redness or breakdown Erythema  over sacrum no skin tear Neuro: Alert/Oriented, Cranial Nerve II-XII normal and Abnormal Motor UE 4/5 , RLE 4/5 except R ankle in PRAFO, Left HF 4/5 Musc/Skel:  Other Left BKA Gen NAD   Assessment/Plan: 1. Functional deficits secondary to Left BKA for osteomyelitis which require 3+ hours per day of interdisciplinary therapy in a comprehensive inpatient rehab setting. Physiatrist is providing close team supervision and 24 hour management of active medical problems listed below. Physiatrist and rehab team continue to assess barriers to discharge/monitor patient progress toward functional and medical goals. FIM: FIM - Bathing Bathing Steps Patient  Completed: Chest;Right Arm;Left Arm;Abdomen;Right upper leg;Left upper leg;Right lower leg (including foot) Bathing: 3: Mod-Patient completes 5-7 53f 10 parts or 50-74%  FIM - Upper Body Dressing/Undressing Upper body dressing/undressing steps patient completed: Thread/unthread right sleeve of pullover shirt/dresss;Thread/unthread left sleeve of pullover shirt/dress;Put head through opening of pull over shirt/dress;Pull shirt over trunk Upper body dressing/undressing: 5: Supervision: Safety issues/verbal cues FIM - Lower Body Dressing/Undressing Lower body dressing/undressing steps patient completed: Thread/unthread right pants leg;Thread/unthread left pants leg;Don/Doff right shoe;Don/Doff right sock Lower body dressing/undressing: 3: Mod-Patient completed 50-74% of tasks  FIM - Toileting Toileting steps completed by patient: Performs perineal hygiene Toileting Assistive Devices: Grab bar or rail for support Toileting: 2: Max-Patient completed 1 of 3 steps     FIM - Control and instrumentation engineer Devices: Arm rests;Walker Bed/Chair Transfer: 4: Supine > Sit: Min A (steadying Pt. > 75%/lift 1 leg);3: Bed > Chair or W/C: Mod A (lift or lower assist)  FIM - Locomotion: Wheelchair Distance: 85 Locomotion: Wheelchair: 0: Activity did not occur FIM - Locomotion: Ambulation Locomotion: Ambulation Assistive Devices: Administrator Ambulation/Gait Assistance: 1: +1 Total assist;2: Max assist Locomotion: Ambulation: 0: Activity did not occur  Comprehension Comprehension Mode: Auditory Comprehension: 5-Understands complex 90% of the time/Cues < 10% of the time  Expression Expression Mode: Verbal Expression: 5-Expresses complex 90% of the time/cues < 10% of the time  Social Interaction Social Interaction Mode: Asleep Social Interaction: 5-Interacts appropriately 90% of the time - Needs monitoring or encouragement for participation or interaction.  Problem  Solving Problem Solving: 4-Solves basic 75 - 89% of the time/requires cueing 10 - 24% of the time  Memory Memory: 4-Recognizes or recalls 75 - 89% of the time/requires cueing 10 - 24% of the time  Medical Problem List and Plan:  1. 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 DVT Prophylaxis/Anticoagulation: SCDs right lower extremity  3. Pain Management: Lyrica 75 mg twice a day, Oxycodone reduced to $RemoveBe'5mg'busmMXGyE$  but with inadequate pain relief will change to $RemoveB'10mg'NuEsugfU$  and Robaxin as needed.  4. Neuropsych: This patient is capable of making decisions on his own behalf.Still confused , balance pain relief with  confusion 5. Acute blood loss anemia. Continue Niferex tablet daily. Followup CBC  6. Right foot wound. Right toe ulceration  Silvadene twice a day applied to the right toe wounds. Consider adaptive shoe for right foot (didn't see one in the room), heel without breakdown 7. Diabetes mellitus with peripheral neuropathy. Hemoglobin A1c 8.6. Glucophage 500 mg twice a day, Levemir 35 units each bedtime and NovoLog 20 units 3 times a day. Check blood sugars a.c. and at bedtime  8. Chronic systolic congestive heart failure. Lasix 40 mg daily. Monitor for any signs of fluid overload  9. PAF with implantable cardioverter defibrillator. Cardiac rate control. Her chest pain or shortness of breath.  10. Hypertension. Coreg 12.5 mg twice a day. Monitor the increased mobility  11. Hyperlipidemia. Lipitor 12.  Grade 1 decubitus over sacrum will encourage side lying and OOB, sacral foam prophyllactic drsg  LOS (Days) 4 A FACE TO FACE EVALUATION WAS PERFORMED  KIRSTEINS,ANDREW E 11/18/2013, 6:15 AM

## 2013-11-18 NOTE — Progress Notes (Signed)
Occupational Therapy Session Note  Patient Details  Name: [name] [name] MRN: [id number] Date of Birth: June 11, 1946  Today's Date: 11/18/2013 Time: 6387-5643 Time Calculation (min): 55 min  Short Term Goals: Week 1:  OT Short Term Goal 1 (Week 1): Pt will perform sit to stand during LB bathing with min assist. OT Short Term Goal 2 (Week 1): Pt will perform toilet transfers stand pivot with RW and min assist. OT Short Term Goal 3 (Week 1): Pt will perform all aspects of LB dressing with min assist. OT Short Term Goal 4 (Week 1): Pt will maintain standing for 2 mins with no more than min assist in order to increase endurance for toileting and selfcare tasks.  Skilled Therapeutic Interventions/Progress Updates:    Pt performed bathing and dressing sit to stand at the sink to begin session.  Min assist for sit to stand when washing his peri area and pulling pants over hips.  Pt decided not to remove his right shoe and sock as nursing is planning to re-dress the right foot once he is transferred back to bed.  He performed all UB selfcare with supervision in sitting.  With conclusion of bathing pt was transferred down the the OT gym to work on standing balance and endurance.  Pt was able to stand at the high/low table for 15 mins with min assist while engaged in using his UEs unilaterally for reaching checkers.  Pt stood the second time for 2-3 mins to complete activity after 2 minute sitting rest break.  Pt transferred back to bed from wheelchair at end of session with mod assist.    Therapy Documentation Precautions:  Precautions Precautions: Fall Precaution Comments: L BKA, R toe wound, blackened heel on R Restrictions Weight Bearing Restrictions: No LLE Weight Bearing: Non weight bearing  Pain: Pain Assessment Pain Assessment: No/denies pain Pain Score: 4  Pain Type: Surgical pain Pain Location: Leg Pain Orientation: Left Pain Descriptors / Indicators: Aching;Throbbing Pain  Frequency: Intermittent Pain Onset: Gradual Patients Stated Pain Goal: 3 Pain Intervention(s): Medication (See eMAR) ADL: See FIM for current functional status  Therapy/Group: Individual Therapy  Athalie Newhard OTR/L 11/18/2013, 12:11 PM

## 2013-11-19 ENCOUNTER — Inpatient Hospital Stay (HOSPITAL_COMMUNITY): Payer: Medicare Other | Admitting: Physical Therapy

## 2013-11-19 ENCOUNTER — Encounter (HOSPITAL_COMMUNITY): Payer: Medicare Other | Admitting: Occupational Therapy

## 2013-11-19 ENCOUNTER — Inpatient Hospital Stay (HOSPITAL_COMMUNITY): Payer: Medicare Other | Admitting: Occupational Therapy

## 2013-11-19 DIAGNOSIS — E119 Type 2 diabetes mellitus without complications: Secondary | ICD-10-CM

## 2013-11-19 DIAGNOSIS — E785 Hyperlipidemia, unspecified: Secondary | ICD-10-CM

## 2013-11-19 DIAGNOSIS — S88119A Complete traumatic amputation at level between knee and ankle, unspecified lower leg, initial encounter: Secondary | ICD-10-CM

## 2013-11-19 DIAGNOSIS — I5022 Chronic systolic (congestive) heart failure: Secondary | ICD-10-CM

## 2013-11-19 DIAGNOSIS — I10 Essential (primary) hypertension: Secondary | ICD-10-CM

## 2013-11-19 DIAGNOSIS — M86679 Other chronic osteomyelitis, unspecified ankle and foot: Secondary | ICD-10-CM

## 2013-11-19 LAB — GLUCOSE, CAPILLARY
Glucose-Capillary: 159 mg/dL — ABNORMAL HIGH (ref 70–99)
Glucose-Capillary: 140 mg/dL — ABNORMAL HIGH (ref 70–99)
Glucose-Capillary: 149 mg/dL — ABNORMAL HIGH (ref 70–99)
Glucose-Capillary: 169 mg/dL — ABNORMAL HIGH (ref 70–99)

## 2013-11-19 NOTE — Progress Notes (Signed)
Occupational Therapy Session Note  Patient Details  Name: LAQUINCY EASTRIDGE MRN: [id number] Date of Birth: 1946-06-14  Today's Date: 11/19/2013 Time: 8527-7824 Time Calculation (min): 61 min  Skilled Therapeutic Interventions/Progress Updates:    Pt transferred from bed to chair scoot pivot to the wheelchair on the right side.  He propelled himself down to the gym with supervision and increased time.  During session had pt work on sit to stand intervals and standing while alternating UEs to use WII game system.  Pt able to perform sit to stand from wheelchair with RW for support and mod to max assist.  He still demonstrates decreased ability to transition his UEs from the arms of the wheelchair to the walker.   Once standing he is able to maintain his static balance with min assist but progresses to mod assist at times once engaged in single limb UE use.  Consistent mod assist however is releasing the RUE.  Pt able to perform several intervals of sit to stand and standing balance for 3-4 minute intervals.  No report of pain or discomfort during session.      Therapy Documentation Precautions:  Precautions Precautions: Fall Precaution Comments: L BKA, R toe wound, blackened heel on R Restrictions Weight Bearing Restrictions: No LLE Weight Bearing: Non weight bearing  Pain: Pain Assessment Pain Assessment: No/denies pain ADL: See FIM for current functional status  Therapy/Group: Individual Therapy  Khaleah Duer OTR/L 11/19/2013, 3:27 PM

## 2013-11-19 NOTE — Progress Notes (Signed)
Physical Therapy Session Note  Patient Details  Name: [name] [name] MRN: [id number] Date of Birth: 1946-05-28  Today's Date: 11/19/2013 Time: 2831-5176  Time Calculation (min): 53 min   Short Term Goals: Week 1:  PT Short Term Goal 1 (Week 1): Pt will perform bed mobility with HOB flat and without rails at supervision level in timely manner PT Short Term Goal 2 (Week 1): Pt will perform sit<> stand with RW at min assist level consistently PT Short Term Goal 3 (Week 1): Pt will perform stand pivot transfer with RW at min assist level PT Short Term Goal 4 (Week 1): Pt will perform dynamic standing balance with RW x 5 mins at min assist to increase activity tolerance to complete ADL's.  PT Short Term Goal 5 (Week 1): Pt will ambulate 67' with RW at min assist consistently while demonstrating increased safety  Skilled Therapeutic Interventions/Progress Updates:   Pt seated EOB eating breakfast.  Denies pain.  Pt w/c R brake noted to be defective and unable to lock wheel for safe transfer.  Changed to 18x18 manual w/c.  Pt performed transfers bed > w/c <> mat squat pivot with min-mod A to maintain hip elevation during full pivot and verbal cues for hand placement and sequence.  Also continues to require verbal cues for w/c set up.  Performed w/c mobility x 150' with bilat UE propulsion with supervision but extra time.  On mat transitioned sit <> supine <> prone with min A for prolonged hip flexor stretching + 5 reps R and L open chain hamstring curls and hip extension.  Transitioned back to supine to perform L quad sets and knee extension and self hamstring stretches with sheet under thigh or foot with min A for technique.  Transitioned back to sitting with supervision.  Performed gait with RW x 12' with min A and verbal cues for extension through UE and shoulders to advance forwards; required verbal and tactile cues to sequencing pivoting and retro hopping to w/c.  Returned to room and set up with  all items within reach.    Therapy Documentation Precautions:  Precautions Precautions: Fall Precaution Comments: L BKA, R toe wound, blackened heel on R Restrictions Weight Bearing Restrictions: Yes LLE Weight Bearing: Non weight bearing Pain: Pain Assessment Pain Assessment: No/denies pain  See FIM for current functional status  Therapy/Group: Individual Therapy  [name] [facility] 11/19/2013, 9:57 AM

## 2013-11-19 NOTE — Progress Notes (Signed)
Occupational Therapy Session Note  Patient Details  Name: [name] [name] MRN: [id number] Date of Birth: November 13, 1945  Today's Date: 11/19/2013 Time: 1430-1500 Time Calculation (min): 30 min  Skilled Therapeutic Interventions/Progress Updates:patient completed approx 25 min with rest breaks on arm bike to increase endurance to ease transfers and increase safety with balance.  At end of session he completed w/c to bed transfer with CGA to his right side and required min A for bed positioning.    Therapy Documentation Precautions:  Precautions Precautions: Fall Precaution Comments: L BKA, R toe wound, blackened heel on R Restrictions Weight Bearing Restrictions: No LLE Weight Bearing: Non weight bearing   Pain: Pain Assessment Pain Assessment: No/denies pain  See FIM for current functional status  Therapy/Group: Individual Therapy  Alfredia Ferguson [facility] 11/19/2013, 5:44 PM

## 2013-11-19 NOTE — Progress Notes (Signed)
[name] [name] is a 68 y.o. male 10-Jan-1946 [id number]  Subjective: No new complaints. No new problems. Slept well. Feeling OK.  Objective: Vital signs in last 24 hours: Temp:  [97.9 F (36.6 C)-98.1 F (36.7 C)] 97.9 F (36.6 C) (03/28 0512) Pulse Rate:  [90-94] 90 (03/28 0512) Resp:  [17-18] 17 (03/28 0512) BP: (117-128)/(62-72) 117/62 mmHg (03/28 0512) SpO2:  [100 %] 100 % (03/28 0512) Weight change:  Last BM Date: 11/18/13  Intake/Output from previous day: 03/27 0701 - 03/28 0700 In: 840 [P.O.:840] Out: 1402 [Urine:1401; Stool:1] Last cbgs: CBG (last 3)   Recent Labs  11/18/13 1655 11/18/13 2036 11/19/13 0731  GLUCAP 172* 234* 159*     Physical Exam General: No apparent distress   HEENT: not dry Lungs: Normal effort. Lungs clear to auscultation, no crackles or wheezes. Cardiovascular: Regular rate and rhythm, no edema Abdomen: S/NT/ND; BS(+) Musculoskeletal:  Unchanged L BKA Neurological: No new neurological deficits Wounds: N/A    Skin: clear  Aging changes Mental state: Alert, oriented, cooperative    Lab Results: BMET    Component Value Date/Time   NA 143 11/15/2013 0730   K 4.3 11/15/2013 0730   CL 103 11/15/2013 0730   CO2 24 11/15/2013 0730   GLUCOSE 124* 11/15/2013 0730   BUN 20 11/15/2013 0730   CREATININE 0.93 11/15/2013 0730   CREATININE 1.02 09/06/2013 0950   CALCIUM 9.1 11/15/2013 0730   GFRNONAA 83* 11/15/2013 0730   GFRAA >90 11/15/2013 0730   CBC    Component Value Date/Time   WBC 6.4 11/15/2013 0730   RBC 4.17* 11/15/2013 0730   HGB 9.9* 11/15/2013 0730   HCT 30.4* 11/15/2013 0730   PLT 286 11/15/2013 0730   MCV 72.9* 11/15/2013 0730   MCH 23.7* 11/15/2013 0730   MCHC 32.6 11/15/2013 0730   RDW 18.2* 11/15/2013 0730   LYMPHSABS 0.8 11/15/2013 0730   MONOABS 0.3 11/15/2013 0730   EOSABS 0.3 11/15/2013 0730   BASOSABS 0.0 11/15/2013 0730    Studies/Results: No results found.  Medications: I have reviewed the patient's current  medications.  Assessment/Plan:   1. 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 DVT Prophylaxis/Anticoagulation: SCDs right lower extremity  3. Pain Management: Lyrica 75 mg twice a day, Oxycodone reduced to 5mg  but with inadequate pain relief will change to 10mg  and Robaxin as needed.  4. Neuropsych: This patient is capable of making decisions on his own behalf.Still confused , balance pain relief with confusion  5. Acute blood loss anemia. Continue Niferex tablet daily. Followup CBC  6. Right foot wound. Right toe ulceration Silvadene twice a day applied to the right toe wounds. Consider adaptive shoe for right foot (didn't see one in the room), heel without breakdown  7. Diabetes mellitus with peripheral neuropathy. Hemoglobin A1c 8.6. Glucophage 500 mg twice a day, Levemir 35 units each bedtime and NovoLog 20 units 3 times a day. Check blood sugars a.c. and at bedtime  8. Chronic systolic congestive heart failure. Lasix 40 mg daily. Monitor for any signs of fluid overload  9. PAF with implantable cardioverter defibrillator. Cardiac rate control. Her chest pain or shortness of breath.  10. Hypertension. Coreg 12.5 mg twice a day. Monitor the increased mobility  11. Hyperlipidemia. Lipitor  12. Grade 1 decubitus over sacrum will encourage side lying and OOB, sacral foam prophyllactic drsg  Cont Rx    Length of stay, days: 5  Walker Kehr , MD 11/19/2013, 9:36 AM

## 2013-11-19 NOTE — Progress Notes (Signed)
Occupational Therapy Session Note  Patient Details  Name: [name] [name] MRN: [id number] Date of Birth: 07-Oct-1945  Today's Date: 11/19/2013 Time: 1002-1058 Time Calculation (min): 56 min  Short Term Goals: Week 1:  OT Short Term Goal 1 (Week 1): Pt will perform sit to stand during LB bathing with min assist. OT Short Term Goal 2 (Week 1): Pt will perform toilet transfers stand pivot with RW and min assist. OT Short Term Goal 3 (Week 1): Pt will perform all aspects of LB dressing with min assist. OT Short Term Goal 4 (Week 1): Pt will maintain standing for 2 mins with no more than min assist in order to increase endurance for toileting and selfcare tasks.  Skilled Therapeutic Interventions/Progress Updates:    Pt performed bathing and dressing sit to stand at the sink this session.  He was able to perform all UB bathing and dressing with supervision.  He performed LB bathing and dressing with min assist as well sit to stand.  He still needs mod instructional cueing to lock the wheelchair brakes and to push up from the wheelchair instead of trying to pull up on the sink.  Min assist for standing balance while having at least one UE supported on the sink.  Instructed pt to alternating pulling up one side of his shorts at a time and to not attempt both together as it will increase his risk of falling.  Pt able to complete all grooming tasks with setup at wheelchair level this session.   Therapy Documentation Precautions:  Precautions Precautions: Fall Precaution Comments: L BKA, R toe wound, blackened heel on R Restrictions Weight Bearing Restrictions: Yes LLE Weight Bearing: Non weight bearing  Pain: Pain Assessment Pain Assessment: No/denies pain ADL: See FIM for current functional status  Therapy/Group: Individual Therapy  Loys Hoselton OTR/L 11/19/2013, 10:59 AM

## 2013-11-20 ENCOUNTER — Inpatient Hospital Stay (HOSPITAL_COMMUNITY): Payer: Medicare Other | Admitting: Physical Therapy

## 2013-11-20 ENCOUNTER — Inpatient Hospital Stay (HOSPITAL_COMMUNITY): Payer: Medicare Other | Admitting: *Deleted

## 2013-11-20 DIAGNOSIS — IMO0001 Reserved for inherently not codable concepts without codable children: Secondary | ICD-10-CM

## 2013-11-20 DIAGNOSIS — E1165 Type 2 diabetes mellitus with hyperglycemia: Secondary | ICD-10-CM

## 2013-11-20 LAB — GLUCOSE, CAPILLARY
Glucose-Capillary: 143 mg/dL — ABNORMAL HIGH (ref 70–99)
Glucose-Capillary: 194 mg/dL — ABNORMAL HIGH (ref 70–99)
Glucose-Capillary: 114 mg/dL — ABNORMAL HIGH (ref 70–99)
Glucose-Capillary: 237 mg/dL — ABNORMAL HIGH (ref 70–99)

## 2013-11-20 NOTE — Progress Notes (Signed)
Physical Therapy Session Note  Patient Details  Name: [name] [name] MRN: [id number] Date of Birth: 13-Apr-1946  Today's Date: 11/20/2013 Time: 1430-1530 Time Calculation (min): 60 min  Short Term Goals: Week 1:  PT Short Term Goal 1 (Week 1): Pt will perform bed mobility with HOB flat and without rails at supervision level in timely manner PT Short Term Goal 2 (Week 1): Pt will perform sit<> stand with RW at min assist level consistently PT Short Term Goal 3 (Week 1): Pt will perform stand pivot transfer with RW at min assist level PT Short Term Goal 4 (Week 1): Pt will perform dynamic standing balance with RW x 5 mins at min assist to increase activity tolerance to complete ADL's.  PT Short Term Goal 5 (Week 1): Pt will ambulate 19' with RW at min assist consistently while demonstrating increased safety  Skilled Therapeutic Interventions/Progress Updates:    Pt received supine in bed with pts wife present. Supine to sit with min Assist and slight LOB requiring therapist assist to improve upright sitting. Gait training with RW 10' with min to mod assist, increased assistance with pivoting to get to wheelchair. Nu step x 6 mins at Level 2 with B UEs and R LE, stand pivot transfers with mod vcs and mod assist to improve forward trunk and sequencing. Supine B LE therapist instructed strengthening for L hip flexion, R heel slides, abduction, SLR, quad/glut sets with 5 second hold x 10 each with pt demonstrating moderate fatigue and requiring rest between each set.  Wheelchair mobility x 125' x 2 with supervision assist. Pt very motivated.   Therapy Documentation Precautions:  Precautions Precautions: Fall Precaution Comments: L BKA, R toe wound, blackened heel on R Restrictions Weight Bearing Restrictions: Yes LLE Weight Bearing: Non weight bearing       Pain: Pain Assessment Pain Assessment: 0-10 Pain Score: 2 Pain Type: Surgical pain Pain Location: Leg Pain Orientation:  Left Pain Descriptors / Indicators: Aching Pain Frequency: Intermittent Pain Onset: Gradual Patients Stated Pain Goal: 3 Pain Intervention(s): Medication (See eMAR)                  See FIM for current functional status  Therapy/Group: Individual Therapy  Jeanette Caprice 11/20/2013, 4:14 PM

## 2013-11-20 NOTE — Progress Notes (Signed)
[name] [name] is a 68 y.o. male 10-15-1945 [id number]  Subjective: No new complaints. No new problems. Feeling OK.  Objective: Vital signs in last 24 hours: Temp:  [98.1 F (36.7 C)-98.3 F (36.8 C)] 98.3 F (36.8 C) (03/29 0529) Pulse Rate:  [91-93] 91 (03/29 0529) Resp:  [16-18] 16 (03/29 0529) BP: (116-123)/(65-67) 116/67 mmHg (03/29 0529) SpO2:  [100 %] 100 % (03/29 0529) Weight change:  Last BM Date: 11/19/13  Intake/Output from previous day: 03/28 0701 - 03/29 0700 In: 600 [P.O.:600] Out: 875 [Urine:875] Last cbgs: CBG (last 3)   Recent Labs  11/19/13 1649 11/19/13 2115 11/20/13 0724  GLUCAP 149* 169* 143*     Physical Exam General: No apparent distress   HEENT: not dry Lungs: Normal effort. Lungs clear to auscultation, no crackles or wheezes. Cardiovascular: Regular rate and rhythm, no edema Abdomen: S/NT/ND; BS(+) Musculoskeletal:  Unchanged L BKA Neurological: No new neurological deficits Wounds: N/A    Skin: clear  Aging changes Mental state: Alert, oriented, cooperative    Lab Results: BMET    Component Value Date/Time   NA 143 11/15/2013 0730   K 4.3 11/15/2013 0730   CL 103 11/15/2013 0730   CO2 24 11/15/2013 0730   GLUCOSE 124* 11/15/2013 0730   BUN 20 11/15/2013 0730   CREATININE 0.93 11/15/2013 0730   CREATININE 1.02 09/06/2013 0950   CALCIUM 9.1 11/15/2013 0730   GFRNONAA 83* 11/15/2013 0730   GFRAA >90 11/15/2013 0730   CBC    Component Value Date/Time   WBC 6.4 11/15/2013 0730   RBC 4.17* 11/15/2013 0730   HGB 9.9* 11/15/2013 0730   HCT 30.4* 11/15/2013 0730   PLT 286 11/15/2013 0730   MCV 72.9* 11/15/2013 0730   MCH 23.7* 11/15/2013 0730   MCHC 32.6 11/15/2013 0730   RDW 18.2* 11/15/2013 0730   LYMPHSABS 0.8 11/15/2013 0730   MONOABS 0.3 11/15/2013 0730   EOSABS 0.3 11/15/2013 0730   BASOSABS 0.0 11/15/2013 0730    Studies/Results: No results found.  Medications: I have reviewed the patient's current  medications.  Assessment/Plan:   1. 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 DVT Prophylaxis/Anticoagulation: SCDs right lower extremity  3. Pain Management: Lyrica 75 mg twice a day, Oxycodone reduced to 5mg  but with inadequate pain relief will change to 10mg  and Robaxin as needed.  4. Neuropsych: This patient is capable of making decisions on his own behalf.Still confused , balance pain relief with confusion  5. Acute blood loss anemia. Continue Niferex tablet daily. Followup CBC  6. Right foot wound. Right toe ulceration Silvadene twice a day applied to the right toe wounds. Consider adaptive shoe for right foot (didn't see one in the room), heel without breakdown  7. Diabetes mellitus with peripheral neuropathy. Hemoglobin A1c 8.6. Glucophage 500 mg twice a day, Levemir 35 units each bedtime and NovoLog 20 units 3 times a day. Check blood sugars a.c. and at bedtime  8. Chronic systolic congestive heart failure. Lasix 40 mg daily. Monitor for any signs of fluid overload  9. PAF with implantable cardioverter defibrillator. Cardiac rate control. Her chest pain or shortness of breath.  10. Hypertension. Coreg 12.5 mg twice a day. Monitor the increased mobility  11. Hyperlipidemia. Lipitor  12. Grade 1 decubitus over sacrum will encourage side lying and OOB, sacral foam prophyllactic drsg      Length of stay, days: 6  Walker Kehr , MD 11/20/2013, 8:37 AM

## 2013-11-20 NOTE — Progress Notes (Signed)
Occupational Therapy Note  Patient Details  Name: [name] [name] MRN: [id number] Date of Birth: 1946-07-29 Today's Date: 11/20/2013  Time: 0950- 1050  (60 min) Pain:  2/10 left leg Individual session  Pt. Lying in bed upon OT arrival.  Performed supine to sit with SBA. Transferred from bed to chair scoot pivot to wc going to right side with min assist.  Pt. Performed sit to stand from wc using sink  with mod assist for peri care.  Pt. Engaged in grooming, bathing, dressing with good endurance at wc level.  Pt. Left in wc with call bell and phone in reach.   Lisa Roca 11/20/2013, 10:23 AM

## 2013-11-21 ENCOUNTER — Inpatient Hospital Stay (HOSPITAL_COMMUNITY): Payer: Medicare Other | Admitting: Speech Pathology

## 2013-11-21 ENCOUNTER — Inpatient Hospital Stay (HOSPITAL_COMMUNITY): Payer: Medicare Other | Admitting: Rehabilitation

## 2013-11-21 ENCOUNTER — Encounter (HOSPITAL_COMMUNITY): Payer: Medicare Other | Admitting: Occupational Therapy

## 2013-11-21 DIAGNOSIS — I5022 Chronic systolic (congestive) heart failure: Secondary | ICD-10-CM

## 2013-11-21 DIAGNOSIS — S88119A Complete traumatic amputation at level between knee and ankle, unspecified lower leg, initial encounter: Secondary | ICD-10-CM

## 2013-11-21 DIAGNOSIS — M869 Osteomyelitis, unspecified: Secondary | ICD-10-CM

## 2013-11-21 DIAGNOSIS — I739 Peripheral vascular disease, unspecified: Secondary | ICD-10-CM

## 2013-11-21 DIAGNOSIS — E1142 Type 2 diabetes mellitus with diabetic polyneuropathy: Secondary | ICD-10-CM

## 2013-11-21 DIAGNOSIS — L98499 Non-pressure chronic ulcer of skin of other sites with unspecified severity: Secondary | ICD-10-CM

## 2013-11-21 DIAGNOSIS — E1149 Type 2 diabetes mellitus with other diabetic neurological complication: Secondary | ICD-10-CM

## 2013-11-21 LAB — GLUCOSE, CAPILLARY
GLUCOSE-CAPILLARY: 87 mg/dL (ref 70–99)
GLUCOSE-CAPILLARY: 126 mg/dL — AB (ref 70–99)
Glucose-Capillary: 171 mg/dL — ABNORMAL HIGH (ref 70–99)
Glucose-Capillary: 160 mg/dL — ABNORMAL HIGH (ref 70–99)

## 2013-11-21 NOTE — Progress Notes (Signed)
Physical Therapy Session Note  Patient Details  Name: [name] [name] MRN: [id number] Date of Birth: 02/19/1946  Today's Date: 11/21/2013 Time: 0830-0929 Time Calculation (min): 59 min  Short Term Goals: Week 1:  PT Short Term Goal 1 (Week 1): Pt will perform bed mobility with HOB flat and without rails at supervision level in timely manner PT Short Term Goal 2 (Week 1): Pt will perform sit<> stand with RW at min assist level consistently PT Short Term Goal 3 (Week 1): Pt will perform stand pivot transfer with RW at min assist level PT Short Term Goal 4 (Week 1): Pt will perform dynamic standing balance with RW x 5 mins at min assist to increase activity tolerance to complete ADL's.  PT Short Term Goal 5 (Week 1): Pt will ambulate 28' with RW at min assist consistently while demonstrating increased safety  Skilled Therapeutic Interventions/Progress Updates:   Pt received lying in bed, agreeable to therapy this morning.  Performed bed mobility at supervision with HOB flat and without handrails to better simulate home environment.  Pt did much better today using UEs to self assist trunk into sitting.  Performed stand pivot transfer bed >w/c at min assist level with mod cues for hand placement and to ensure proper placement of RLE.  Once in chair, pt independent in donning R darco shoe to protect R foot.  Pt self propelled x 75' to ortho gym in order to perform car transfer.  Again, performed stand pivot with min assist to car with cues for safety and negotiation of RW.  Also min cues for using hands on seat to adjust hips, rather than reaching for bar.  Pt then self propelled another 8' to gym using [place].  Note pt doing better with larger propulsion techniques with UEs and requires less cues.  Had pt propel up/down ramp in order to simulate home entry (pt reports that wife is to install ramp this week prior to D/C).  Requires min/guard assist, esp when descending ramp for safety and mod verbal and  demonstration cues for correct propulsion technique when ascending ramp.  Remainder of session focused on gait training with RW for improved technique and to increase activity tolerance.  Pt able to ambulate 12' x 1, 16' x 1 and 24' x 1 with RW at min assist level with continued cues for RW position, large steps on RLE, and turning sequence.  Ended session on nustep x 5 mins (pt did continuously today) at level 2 resistance with BUEs/RLE.  Transferred back to w/c and was assisted back to room.  Supported L residual limb and left all needs in reach.   Therapy Documentation Precautions:  Precautions Precautions: Fall Precaution Comments: L BKA, R toe wound, blackened heel on R Restrictions Weight Bearing Restrictions: Yes LLE Weight Bearing: Non weight bearing   Pain: pt with very little pain during session.  Ambulation Ambulation/Gait Assistance: 4: Min Oceanographer: 150 (all together)   See FIM for current functional status  Therapy/Group: Individual Therapy  Denice Bors 11/21/2013, 12:15 PM

## 2013-11-21 NOTE — Progress Notes (Signed)
Complained of pain to left BKA, rarely complains of pain, 2 PRN oxy IR given at 0022 with relief. Coban dressing to left BKA-CD & I. Changed dressing to right foot, silvadene applied per orders. Biggest area to right great toe, couple smaller ares between 1st and 2nd toe and 2nd and 3rd toe. Wrapped foot in kerlix, then applied PRAFO boot. Foam dressing to sacrum.[name] [name]

## 2013-11-21 NOTE — Progress Notes (Signed)
Physical Therapy Session Note  Patient Details  Name: [name] [name] MRN: [id number] Date of Birth: 1946/03/06  Today's Date: 11/21/2013 Time: 0175-1025 Time Calculation (min): 22 min  Short Term Goals: Week 1:  PT Short Term Goal 1 (Week 1): Pt will perform bed mobility with HOB flat and without rails at supervision level in timely manner PT Short Term Goal 2 (Week 1): Pt will perform sit<> stand with RW at min assist level consistently PT Short Term Goal 3 (Week 1): Pt will perform stand pivot transfer with RW at min assist level PT Short Term Goal 4 (Week 1): Pt will perform dynamic standing balance with RW x 5 mins at min assist to increase activity tolerance to complete ADL's.  PT Short Term Goal 5 (Week 1): Pt will ambulate 23' with RW at min assist consistently while demonstrating increased safety  Skilled Therapeutic Interventions/Progress Updates:   Pt received lying in bed, agreeable to therapy.  Performed bed mobility at supervision level with HOB flat and without handrails to better simulate home environment.  Once seated at EOB, performed stand pivot transfer to w/c with RW at min assist.  Pt doing much better with foot placement, however requires mod cues for hand placement, as he kept demonstrating placing hand on lower part of RW.  Pt self propelled to/from ADL apt at supervision level with min cues for technique, esp in and around apt due to tighter spaces and carpeted.  In ADL apt, performed stand pivot transfer w/c <> bed at min assist with continued cues for hand placement and forward weight shift.  In supine, performed SLR LLE x 10 reps, knee presses x 10 reps, and SL hip abd LLE x 10 reps.  Pt transferred back to w/c as stated above and returned to room.  All needs in reach and L residual limb propped on sliding board with pillows.     Therapy Documentation Precautions:  Precautions Precautions: Fall Precaution Comments: L BKA, R toe wound, blackened heel on  R Restrictions Weight Bearing Restrictions: Yes LLE Weight Bearing: Non weight bearing Therapy Vitals Temp: 98.2 F (36.8 C) Temp src: Oral Pulse Rate: 99 Resp: 19 BP: 111/61 mmHg Patient Position, if appropriate: Sitting Oxygen Therapy SpO2: 100 % O2 Device: None (Room air) Pain: Pt with no c/o pain during session.     See FIM for current functional status  Therapy/Group: Individual Therapy  Denice Bors 11/21/2013, 4:07 PM

## 2013-11-21 NOTE — Progress Notes (Signed)
68 y.o. right-handed male with history of diabetes mellitus or peripheral neuropathy, chronic systolic congestive heart failure, PAF with implantable cardioverter defibrillator. Patient lives with his wife uses a walker and wheelchair prior to admission. Admitted 11/09/2013 with left foot osteomyelitis and nonhealing ulcers. Limb was not felt to be salvageable. 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 Postoperative pain management. Postoperative anemia 8.0 and transfuse 11/11/2013. Close monitoring right foot wound with right toe ulceration and blackened heel. Bouts of confusion  Subjective/Complaints: Right foot without change. Had more pain in left stump last night which responded to meds  Review of Systems - Negative except Left stump pain  Objective: Vital Signs: Blood pressure 127/67, pulse 86, temperature 98 F (36.7 C), temperature source Oral, resp. rate 18, height 5\' 9"  (1.753 m), weight 76.114 kg (167 lb 12.8 oz), SpO2 100.00%. No results found. Results for orders placed during the hospital encounter of 11/14/13 (from the past 72 hour(s))  GLUCOSE, CAPILLARY     Status: Abnormal   Collection Time    11/18/13 11:56 AM      Result Value Ref Range   Glucose-Capillary 154 (*) 70 - 99 mg/dL   Comment 1 Notify RN    GLUCOSE, CAPILLARY     Status: Abnormal   Collection Time    11/18/13  4:55 PM      Result Value Ref Range   Glucose-Capillary 172 (*) 70 - 99 mg/dL  GLUCOSE, CAPILLARY     Status: Abnormal   Collection Time    11/18/13  8:36 PM      Result Value Ref Range   Glucose-Capillary 234 (*) 70 - 99 mg/dL  GLUCOSE, CAPILLARY     Status: Abnormal   Collection Time    11/19/13  7:31 AM      Result Value Ref Range   Glucose-Capillary 159 (*) 70 - 99 mg/dL  GLUCOSE, CAPILLARY     Status: Abnormal   Collection Time    11/19/13 11:45 AM      Result Value Ref Range   Glucose-Capillary 140 (*) 70 - 99 mg/dL  GLUCOSE, CAPILLARY     Status: Abnormal   Collection Time    11/19/13  4:49 PM      Result Value Ref Range   Glucose-Capillary 149 (*) 70 - 99 mg/dL  GLUCOSE, CAPILLARY     Status: Abnormal   Collection Time    11/19/13  9:15 PM      Result Value Ref Range   Glucose-Capillary 169 (*) 70 - 99 mg/dL  GLUCOSE, CAPILLARY     Status: Abnormal   Collection Time    11/20/13  7:24 AM      Result Value Ref Range   Glucose-Capillary 143 (*) 70 - 99 mg/dL  GLUCOSE, CAPILLARY     Status: Abnormal   Collection Time    11/20/13 11:23 AM      Result Value Ref Range   Glucose-Capillary 194 (*) 70 - 99 mg/dL  GLUCOSE, CAPILLARY     Status: Abnormal   Collection Time    11/20/13  4:46 PM      Result Value Ref Range   Glucose-Capillary 114 (*) 70 - 99 mg/dL  GLUCOSE, CAPILLARY     Status: Abnormal   Collection Time    11/20/13  9:17 PM      Result Value Ref Range   Glucose-Capillary 237 (*) 70 - 99 mg/dL  GLUCOSE, CAPILLARY     Status: None   Collection Time  11/21/13  7:15 AM      Result Value Ref Range   Glucose-Capillary 87  70 - 99 mg/dL     HEENT: poor dentition Cardio: irregular and no murmur Resp: CTA B/L and unlabored GI: BS positive and NT,ND Extremity:  Pulses positive and No Edema Skin:   Wound Left BK with Coban, , R lat great toe PIP area ulcer,   Erythema over sacrum no skin tear Neuro: Alert/Oriented, Cranial Nerve II-XII normal and Abnormal Motor UE 4/5 , RLE 4/5 except R ankle in PRAFO, Left HF 4/5 Musc/Skel:  Other Left BKA Gen NAD   Assessment/Plan: 1. Functional deficits secondary to Left BKA for osteomyelitis which require 3+ hours per day of interdisciplinary therapy in a comprehensive inpatient rehab setting. Physiatrist is providing close team supervision and 24 hour management of active medical problems listed below. Physiatrist and rehab team continue to assess barriers to discharge/monitor patient progress toward functional and medical goals. FIM: FIM - Bathing Bathing Steps Patient Completed:  Chest;Right Arm;Left Arm;Abdomen;Right upper leg;Left upper leg;Right lower leg (including foot);Front perineal area;Buttocks Bathing: 4: Steadying assist  FIM - Upper Body Dressing/Undressing Upper body dressing/undressing steps patient completed: Thread/unthread right sleeve of pullover shirt/dresss;Thread/unthread left sleeve of pullover shirt/dress;Put head through opening of pull over shirt/dress;Pull shirt over trunk Upper body dressing/undressing: 5: Supervision: Safety issues/verbal cues FIM - Lower Body Dressing/Undressing Lower body dressing/undressing steps patient completed: Thread/unthread right pants leg;Thread/unthread left pants leg;Pull pants up/down Lower body dressing/undressing: 4: Min-Patient completed 75 plus % of tasks  FIM - Toileting Toileting steps completed by patient: Performs perineal hygiene Toileting Assistive Devices: Grab bar or rail for support Toileting: 0: Activity did not occur  FIM - Radio producer Devices: Product manager Transfers: 0-Activity did not occur  FIM - Control and instrumentation engineer Devices: Arm rests Bed/Chair Transfer: 4: Supine > Sit: Min A (steadying Pt. > 75%/lift 1 leg);3: Bed > Chair or W/C: Mod A (lift or lower assist)  FIM - Locomotion: Wheelchair Distance: 150 Locomotion: Wheelchair: 5: Travels 150 ft or more: maneuvers on rugs and over door sills with supervision, cueing or coaxing FIM - Locomotion: Ambulation Locomotion: Ambulation Assistive Devices: Administrator Ambulation/Gait Assistance: 4: Min assist Locomotion: Ambulation: 1: Travels less than 50 ft with minimal assistance (Pt.>75%)  Comprehension Comprehension Mode: Auditory Comprehension: 4-Understands basic 75 - 89% of the time/requires cueing 10 - 24% of the time  Expression Expression Mode: Verbal Expression: 5-Expresses basic 90% of the time/requires cueing < 10% of the time.  Social Interaction Social  Interaction Mode: Asleep Social Interaction: 5-Interacts appropriately 90% of the time - Needs monitoring or encouragement for participation or interaction.  Problem Solving Problem Solving: 4-Solves basic 75 - 89% of the time/requires cueing 10 - 24% of the time  Memory Memory: 4-Recognizes or recalls 75 - 89% of the time/requires cueing 10 - 24% of the time  Medical Problem List and Plan:  1. 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 DVT Prophylaxis/Anticoagulation: SCDs right lower extremity  3. Pain Management: Lyrica 75 mg twice a day, Oxycodone 10mg  and Robaxin as needed with reasonable control.  4. Neuropsych: This patient is capable of making decisions on his own behalf.Still confused , balance pain relief with  confusion 5. Acute blood loss anemia. Continue Niferex tablet daily. 6. Right foot wound. Right toe ulceration  Silvadene twice a day applied to the right toe wounds.   -?adaptive shoe for right foot 7. Diabetes mellitus  with peripheral neuropathy. Hemoglobin A1c 8.6. Glucophage 500 mg twice a day, Levemir 35 units each bedtime and NovoLog 3 units 3 times a day.   8. Chronic systolic congestive heart failure. Lasix 40 mg daily. Monitor for any signs of fluid overload  9. PAF with implantable cardioverter defibrillator. Cardiac rate control. Her chest pain or shortness of breath.  10. Hypertension. Coreg 12.5 mg twice a day.   11. Hyperlipidemia. Lipitor 12.  Grade 1 decubitus over sacrum will encourage side lying and OOB, sacral foam prophyllactic drsg  LOS (Days) 7 A FACE TO FACE EVALUATION WAS PERFORMED  SWARTZ,ZACHARY T 11/21/2013, 7:49 AM

## 2013-11-21 NOTE — Progress Notes (Signed)
Occupational Therapy Session Note  Patient Details  Name: [name] [name] MRN: [id number] Date of Birth: 10-07-1945  Today's Date: 11/21/2013 Time: 8250-5397 Time Calculation (min): 55 min  Short Term Goals: Week 1:  OT Short Term Goal 1 (Week 1): Pt will perform sit to stand during LB bathing with min assist. OT Short Term Goal 2 (Week 1): Pt will perform toilet transfers stand pivot with RW and min assist. OT Short Term Goal 3 (Week 1): Pt will perform all aspects of LB dressing with min assist. OT Short Term Goal 4 (Week 1): Pt will maintain standing for 2 mins with no more than min assist in order to increase endurance for toileting and selfcare tasks.  Skilled Therapeutic Interventions/Progress Updates:  Upon entering room, patient found seated in w/c with no complaints of pain. Patient maneuvered w/c throughout room in order to gather items for his ADL. Patient able to independently recall his ADL routine with OT each morning. Patient completed UB/LB bathing & dressing tasks in sit<>stand position at sink. Patient takes more than a reasonable amount of time to complete tasks. Therapist notified RN of needed dressing change > sacrum and nursing completed this while patient standing at sink. Patient also sat at sink for grooming task of brushing teeth. During session, focused on problem solving, sit<>stands, ADL retraining at sink, dynamic standing balance/tolerance/endurance, w/c mobility, and overall activity tolerance/endurance. At end of session, left patient seated in w/c beside bed with all needed items within reach. Notified RN to change dressing > right foot.   Precautions:  Precautions Precautions: Fall Precaution Comments: L BKA, R toe wound, blackened heel on R Restrictions Weight Bearing Restrictions: Yes LLE Weight Bearing: Non weight bearing  See FIM for current functional status  Therapy/Group: Individual Therapy  [place] Reliford 11/21/2013, 10:32 AM

## 2013-11-21 NOTE — Progress Notes (Signed)
Speech Language Pathology Daily Session Note  Patient Details  Name: [name] [name] MRN: [id number] Date of Birth: August 06, 1946  Today's Date: 11/21/2013 Time: 2952-8413 Time Calculation (min): 22 min  Short Term Goals: Week 1: SLP Short Term Goal 1 (Week 1): Patient will utilize external aids to assist with recall of new information with Supervision cues SLP Short Term Goal 2 (Week 1): Patient will utilize word finding strategies with Min cues SLP Short Term Goal 3 (Week 1): Patient will request help as needed with Min question cues  Skilled Therapeutic Interventions: Skilled treatment session focused on addressing cognition goals.  SLP facilitated session with discussion with patient and family members regarding recall compensatory strategies; patient able to verbalize but continues to require cues to utilize external aids.  SLP also initiated discussion about discharge planning; patient required Supervision question cues to anticipate needs during discussion.  Patient's cognition appears to have cleared with reduction in medication as a result, recommend wrap up education tomorrow and then discharge.     FIM:  Comprehension Comprehension Mode: Auditory Comprehension: 5-Follows basic conversation/direction: With extra time/assistive device Expression Expression Mode: Verbal Expression: 5-Expresses basic 90% of the time/requires cueing < 10% of the time. Social Interaction Social Interaction: 6-Interacts appropriately with others with medication or extra time (anti-anxiety, antidepressant). Problem Solving Problem Solving: 5-Solves basic 90% of the time/requires cueing < 10% of the time Memory Memory: 5-Requires cues to use assistive device  Pain Pain Assessment Pain Assessment: No/denies pain  Therapy/Group: Individual Therapy  Carmelia Roller., [place] [phone]  [place] 11/21/2013, 4:07 PM

## 2013-11-22 ENCOUNTER — Inpatient Hospital Stay (HOSPITAL_COMMUNITY): Payer: Medicare Other | Admitting: Occupational Therapy

## 2013-11-22 ENCOUNTER — Inpatient Hospital Stay (HOSPITAL_COMMUNITY): Payer: Medicare Other | Admitting: Rehabilitation

## 2013-11-22 ENCOUNTER — Inpatient Hospital Stay (HOSPITAL_COMMUNITY): Payer: Medicare Other

## 2013-11-22 DIAGNOSIS — I5022 Chronic systolic (congestive) heart failure: Secondary | ICD-10-CM

## 2013-11-22 DIAGNOSIS — S88119A Complete traumatic amputation at level between knee and ankle, unspecified lower leg, initial encounter: Secondary | ICD-10-CM

## 2013-11-22 DIAGNOSIS — M869 Osteomyelitis, unspecified: Secondary | ICD-10-CM

## 2013-11-22 DIAGNOSIS — L98499 Non-pressure chronic ulcer of skin of other sites with unspecified severity: Secondary | ICD-10-CM

## 2013-11-22 DIAGNOSIS — E1149 Type 2 diabetes mellitus with other diabetic neurological complication: Secondary | ICD-10-CM

## 2013-11-22 DIAGNOSIS — I739 Peripheral vascular disease, unspecified: Secondary | ICD-10-CM

## 2013-11-22 DIAGNOSIS — E1142 Type 2 diabetes mellitus with diabetic polyneuropathy: Secondary | ICD-10-CM

## 2013-11-22 LAB — GLUCOSE, CAPILLARY
GLUCOSE-CAPILLARY: 134 mg/dL — AB (ref 70–99)
GLUCOSE-CAPILLARY: 169 mg/dL — AB (ref 70–99)
Glucose-Capillary: 145 mg/dL — ABNORMAL HIGH (ref 70–99)
Glucose-Capillary: 118 mg/dL — ABNORMAL HIGH (ref 70–99)

## 2013-11-22 NOTE — Plan of Care (Signed)
Problem: RH Stairs Goal: LTG Patient will ambulate up and down stairs w/assist (PT) LTG: Patient will ambulate up and down # of stairs with assistance (PT)  Outcome: Not Applicable Date Met:  40/81/44 Pt reports that wife is having ramp installed prior to D/C, therefore will add ramp goal.

## 2013-11-22 NOTE — Progress Notes (Signed)
Physical Therapy Session Note  Patient Details  Name: [name] [name] MRN: [id number] Date of Birth: July 13, 1946  Today's Date: 11/22/2013 Time: 1500-1520 Time Calculation (min): 20 min  Short Term Goals: Week 1:  PT Short Term Goal 1 (Week 1): Pt will perform bed mobility with HOB flat and without rails at supervision level in timely manner PT Short Term Goal 2 (Week 1): Pt will perform sit<> stand with RW at min assist level consistently PT Short Term Goal 3 (Week 1): Pt will perform stand pivot transfer with RW at min assist level PT Short Term Goal 4 (Week 1): Pt will perform dynamic standing balance with RW x 5 mins at min assist to increase activity tolerance to complete ADL's.  PT Short Term Goal 5 (Week 1): Pt will ambulate 67' with RW at min assist consistently while demonstrating increased safety  Skilled Therapeutic Interventions/Progress Updates:   Pt received lying in bed, agreeable to earlier session than scheduled.  Performed bed mobility at supervision level with HOB elevated.  [name] shoe already donned, therefore performed approx 30' ambulation out of room into hallway with RW at min/guard to close supervision level.  Continue to provide cues for upright posture and increased steps on RLE to conserve energy.  Pt then assisted down to therapy gym in w/c and performed stand pivot transfer w/c to mat at min/guard level with RW with continued cues for safe RW placement, esp when turning.  Once on mat, had pt get into supine at supervision level and had pt attempt to recall supine therex for increased carryover.  Pt able to recall SLR and performed LLE and RLE x 10 reps, knee presses (required min cues to recall this exercise) and performed x 10 reps, and began to perform SL hip abd x 5 reps (pt recalling hip abd in supine with cues required to recall getting into SL).  Also provided cues for maintaining good SL position and not tilting hips too far forwards or backwards.  OT arrived  to continue with OT session.  Pt left on mat with OT.   Therapy Documentation Precautions:  Precautions Precautions: Fall Precaution Comments: L BKA, R toe wound, blackened heel on R Restrictions Weight Bearing Restrictions: Yes LLE Weight Bearing: Non weight bearing   Pain:Pt with no c/o pain this afternoon.    Locomotion : Ambulation Ambulation/Gait Assistance: 4: Min guard;5: Software engineer Distance: 100   See FIM for current functional status  Therapy/Group: Individual Therapy  Denice Bors 11/22/2013, 3:04 PM

## 2013-11-22 NOTE — Progress Notes (Signed)
68 y.o. right-handed male with history of diabetes mellitus or peripheral neuropathy, chronic systolic congestive heart failure, PAF with implantable cardioverter defibrillator. Patient lives with his wife uses a walker and wheelchair prior to admission. Admitted 11/09/2013 with left foot osteomyelitis and nonhealing ulcers. Limb was not felt to be salvageable. 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 Postoperative pain management. Postoperative anemia 8.0 and transfuse 11/11/2013. Close monitoring right foot wound with right toe ulceration and blackened heel. Bouts of confusion  Subjective/Complaints: Had a better night. Said left leg was "draining". Pain under better control  Review of Systems - otherwise neg  Objective: Vital Signs: Blood pressure 106/61, pulse 90, temperature 97.8 F (36.6 C), temperature source Oral, resp. rate 18, height 5\' 9"  (1.753 m), weight 76.114 kg (167 lb 12.8 oz), SpO2 100.00%. No results found. Results for orders placed during the hospital encounter of 11/14/13 (from the past 72 hour(s))  GLUCOSE, CAPILLARY     Status: Abnormal   Collection Time    11/19/13 11:45 AM      Result Value Ref Range   Glucose-Capillary 140 (*) 70 - 99 mg/dL  GLUCOSE, CAPILLARY     Status: Abnormal   Collection Time    11/19/13  4:49 PM      Result Value Ref Range   Glucose-Capillary 149 (*) 70 - 99 mg/dL  GLUCOSE, CAPILLARY     Status: Abnormal   Collection Time    11/19/13  9:15 PM      Result Value Ref Range   Glucose-Capillary 169 (*) 70 - 99 mg/dL  GLUCOSE, CAPILLARY     Status: Abnormal   Collection Time    11/20/13  7:24 AM      Result Value Ref Range   Glucose-Capillary 143 (*) 70 - 99 mg/dL  GLUCOSE, CAPILLARY     Status: Abnormal   Collection Time    11/20/13 11:23 AM      Result Value Ref Range   Glucose-Capillary 194 (*) 70 - 99 mg/dL  GLUCOSE, CAPILLARY     Status: Abnormal   Collection Time    11/20/13  4:46 PM      Result Value Ref  Range   Glucose-Capillary 114 (*) 70 - 99 mg/dL  GLUCOSE, CAPILLARY     Status: Abnormal   Collection Time    11/20/13  9:17 PM      Result Value Ref Range   Glucose-Capillary 237 (*) 70 - 99 mg/dL  GLUCOSE, CAPILLARY     Status: None   Collection Time    11/21/13  7:15 AM      Result Value Ref Range   Glucose-Capillary 87  70 - 99 mg/dL  GLUCOSE, CAPILLARY     Status: Abnormal   Collection Time    11/21/13 11:24 AM      Result Value Ref Range   Glucose-Capillary 126 (*) 70 - 99 mg/dL  GLUCOSE, CAPILLARY     Status: Abnormal   Collection Time    11/21/13  4:37 PM      Result Value Ref Range   Glucose-Capillary 171 (*) 70 - 99 mg/dL  GLUCOSE, CAPILLARY     Status: Abnormal   Collection Time    11/21/13  9:28 PM      Result Value Ref Range   Glucose-Capillary 160 (*) 70 - 99 mg/dL  GLUCOSE, CAPILLARY     Status: Abnormal   Collection Time    11/22/13  7:31 AM      Result Value Ref  Range   Glucose-Capillary 134 (*) 70 - 99 mg/dL   Comment 1 Notify RN       HEENT: poor dentition Cardio: irregular and no murmur Resp: CTA B/L and unlabored GI: BS positive and NT,ND Extremity:  Pulses positive and No Edema Skin:   Wound Left BK with Coban, , R lat great toe PIP area ulcer--some fibronecrotic debris still in area. Left heel with minimal, chronic breakdown  Erythema over sacrum no skin tear Neuro: Alert/Oriented, Cranial Nerve II-XII normal and Abnormal Motor UE 4/5 , RLE 4/5 except R ankle in PRAFO, Left HF 4/5 Musc/Skel:  Other Left BKA Gen NAD   Assessment/Plan: 1. Functional deficits secondary to Left BKA for osteomyelitis which require 3+ hours per day of interdisciplinary therapy in a comprehensive inpatient rehab setting. Physiatrist is providing close team supervision and 24 hour management of active medical problems listed below. Physiatrist and rehab team continue to assess barriers to discharge/monitor patient progress toward functional and medical  goals. FIM: FIM - Bathing Bathing Steps Patient Completed: Chest;Right Arm;Left Arm;Abdomen;Right upper leg;Left upper leg;Right lower leg (including foot);Front perineal area;Buttocks Bathing: 4: Steadying assist  FIM - Upper Body Dressing/Undressing Upper body dressing/undressing steps patient completed: Thread/unthread right sleeve of pullover shirt/dresss;Thread/unthread left sleeve of pullover shirt/dress;Put head through opening of pull over shirt/dress;Pull shirt over trunk Upper body dressing/undressing: 5: Set-up assist to: Obtain clothing/put away FIM - Lower Body Dressing/Undressing Lower body dressing/undressing steps patient completed: Thread/unthread right pants leg;Thread/unthread left pants leg;Pull pants up/down Lower body dressing/undressing: 4: Min-Patient completed 75 plus % of tasks  FIM - Toileting Toileting steps completed by patient: Performs perineal hygiene Toileting Assistive Devices: Grab bar or rail for support Toileting: 0: Activity did not occur  FIM - Radio producer Devices: Product manager Transfers: 0-Activity did not occur  FIM - Control and instrumentation engineer Devices: Arm rests Bed/Chair Transfer: 5: Supine > Sit: Supervision (verbal cues/safety issues);4: Bed > Chair or W/C: Min A (steadying Pt. > 75%)  FIM - Locomotion: Wheelchair Distance: 150 (all together) Locomotion: Wheelchair: 5: Travels 150 ft or more: maneuvers on rugs and over door sills with supervision, cueing or coaxing FIM - Locomotion: Ambulation Locomotion: Ambulation Assistive Devices: Administrator Ambulation/Gait Assistance: 4: Min assist Locomotion: Ambulation: 1: Travels less than 50 ft with minimal assistance (Pt.>75%)  Comprehension Comprehension Mode: Auditory Comprehension: 5-Follows basic conversation/direction: With extra time/assistive device  Expression Expression Mode: Verbal Expression: 5-Expresses basic 90% of  the time/requires cueing < 10% of the time.  Social Interaction Social Interaction Mode: Asleep Social Interaction: 6-Interacts appropriately with others with medication or extra time (anti-anxiety, antidepressant).  Problem Solving Problem Solving: 5-Solves basic 90% of the time/requires cueing < 10% of the time  Memory Memory: 5-Requires cues to use assistive device  Medical Problem List and Plan:  1. 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 DVT Prophylaxis/Anticoagulation: SCDs right lower extremity  3. Pain Management: Lyrica 75 mg twice a day, Oxycodone 10mg  and Robaxin as needed with reasonable control.  4. Neuropsych: This patient is capable of making decisions on his own behalf.Still confused , balance pain relief with  confusion 5. Acute blood loss anemia. Continue Niferex tablet daily. 6. Right foot wound. Right toe ulceration  Silvadene twice a day applied to the right toe wounds.   -adaptive shoe for right foot to help with weight bearing 7. Diabetes mellitus with peripheral neuropathy. Hemoglobin A1c 8.6. Glucophage 500 mg twice a day, Levemir 35  units each bedtime and NovoLog 3 units 3 times a day.  --sugars appear to be balancing out 8. Chronic systolic congestive heart failure. Lasix 40 mg daily. Monitor for any signs of fluid overload  9. PAF with implantable cardioverter defibrillator. Cardiac rate control. Her chest pain or shortness of breath.  10. Hypertension. Coreg 12.5 mg twice a day.   11. Hyperlipidemia. Lipitor 12.  Grade 1 decubitus over sacrum: side lying and OOB, sacral foam prophyllactic drsg  LOS (Days) 8 A FACE TO FACE EVALUATION WAS PERFORMED  [name] [name] T 11/22/2013, 8:20 AM

## 2013-11-22 NOTE — Progress Notes (Signed)
Social Work Patient ID: [name] [name], male   DOB: [birth year], 69 y.o.   MRN: [id number] Met with pt and wife who reports he is getting better and her plan is to take him home next Monday.  Discussed discharge needs and wife to come in for Education prior to discharge.  Pt seems much clearer cognitively.  Wife aware pt will require 24 hr supervision at discharge and she can provide for a short time.

## 2013-11-22 NOTE — Progress Notes (Signed)
Physical Therapy Session Note  Patient Details  Name: [name] [name] MRN: [id number] Date of Birth: 01-19-1946  Today's Date: 11/22/2013 Time: 0830-0929 Time Calculation (min): 59 min  Short Term Goals: Week 1:  PT Short Term Goal 1 (Week 1): Pt will perform bed mobility with HOB flat and without rails at supervision level in timely manner PT Short Term Goal 2 (Week 1): Pt will perform sit<> stand with RW at min assist level consistently PT Short Term Goal 3 (Week 1): Pt will perform stand pivot transfer with RW at min assist level PT Short Term Goal 4 (Week 1): Pt will perform dynamic standing balance with RW x 5 mins at min assist to increase activity tolerance to complete ADL's.  PT Short Term Goal 5 (Week 1): Pt will ambulate 63' with RW at min assist consistently while demonstrating increased safety  Skilled Therapeutic Interventions/Progress Updates:   Pt received sitting on EOB, finishing breakfast and agreeable to therapy.  Assisted with donning darco shoe for RLE due to noted that bandage had come down off of foot.  Performed stand pivot transfer today with RW at supervision level (close supervision) with continued cues for R foot placement and also for quick transition of hands.  Pt self propelled x 100' with BUEs at supervision level to therapy gym and back to room.  Performed 2 reps of gait, 25' x 1 and 30' x 1 at min/guard to close supervision level with cues for hopping and sequencing with RW when turning, however note overall marked improvement with lengths of R step during gait.  Then performed dynamic standing balance activity of tossing horse shoes x 2 reps of 4 mins to carryover to functional ADLs at home.  Performed at supervision level with min cues to keep RW close to him.  Performed seated nustep x 5 mins at level 3 resistance (increased from previous sessions and continously) with BUEs and RLE for increased strength and activity tolerance.  Pt tolerated all activity well  and with less signs of fatigue.  Ended session with w/c mobility in ADL apt to better simulate home environment.  Upon entering apt, pt states he needs to use restroom, therefore pt propelled in ADL bathroom and transferred to toilet with RW at min/guard assist.  Pt able to perform peri care and adjust clothing prior to toileting, however requires assist for adjusting clothing following toileting.  Pt assisted back to room and left in w/c with all needs in reach and L limb supported.    Therapy Documentation Precautions:  Precautions Precautions: Fall Precaution Comments: L BKA, R toe wound, blackened heel on R Restrictions Weight Bearing Restrictions: Yes LLE Weight Bearing: Non weight bearing   Vital Signs:   Pain:   Ambulation Ambulation/Gait Assistance: 4: Min guard;5: Supervision Wheelchair Mobility Distance: 100   See FIM for current functional status  Therapy/Group: Individual Therapy  Denice Bors 11/22/2013, 12:49 PM

## 2013-11-22 NOTE — Progress Notes (Signed)
Occupational Therapy Session Note  Patient Details  Name: [name] [name] MRN: [id number] Date of Birth: 1945/11/22  Today's Date: 11/22/2013 Time: 6010-9323 Time Calculation (min): 44 min  Short Term Goals: Week 1:  OT Short Term Goal 1 (Week 1): Pt will perform sit to stand during LB bathing with min assist. OT Short Term Goal 2 (Week 1): Pt will perform toilet transfers stand pivot with RW and min assist. OT Short Term Goal 3 (Week 1): Pt will perform all aspects of LB dressing with min assist. OT Short Term Goal 4 (Week 1): Pt will maintain standing for 2 mins with no more than min assist in order to increase endurance for toileting and selfcare tasks.  Skilled Therapeutic Interventions/Progress Updates:    Pt seen for ADL retraining with focus on sit<>stand, safety awareness, and problem solving. Pt received supine in bed. Gather bathing items and completed bathing at sink. Pt required min assist for standing balance during self-care tasks. Pt completed sit<>stand x4 with min-supervision assist and verbal cues for positioning of w/c. Pt attempted to stand 1x with w/c positioned too closely to sink and required min cues to problem solve to sit and back w/c further from sink. Completed LB dressing with min assist to don right sock and boot. At end of session pt propelled self in room to get situated and left with all needs in reach. Wife present.  Therapy Documentation Precautions:  Precautions Precautions: Fall Precaution Comments: L BKA, R toe wound, blackened heel on R Restrictions Weight Bearing Restrictions: Yes LLE Weight Bearing: Non weight bearing General:   Vital Signs:   Pain: No report of pain during therapy session.   See FIM for current functional status  Therapy/Group: Individual Therapy  Duayne Cal 11/22/2013, 10:17 AM

## 2013-11-22 NOTE — Progress Notes (Signed)
Occupational Therapy Session Note  Patient Details  Name: [name] [name] MRN: [id number] Date of Birth: Jan 09, 1946  Today's Date: 11/22/2013 Time: 0347-4259 Time Calculation (min): 31 min  Skilled Therapeutic Interventions/Progress Updates:    Pt worked on UE exercises during the session.  Pt sat in the wheelchair and worked on bilateral strengthening using the UE ergonometer.  Performed 3 sets of 3 minute intervals with resistance set at level 12.  He was able to maintain RPMs at 22-25 per minute with 2 sets peddling forward and one set backwards.  Pt used the RW for transfer to wheelchair with min assist during session as well and then transferred to the bed with min assist without the walker.  Pt with decreased efficiency when attempting to transfer to the bed without the walker and had to scoot partially on the bed and then completely on the bed.  Discussed use of RW for all transfers as this is safer for him at this point.    Therapy Documentation Precautions:  Precautions Precautions: Fall Precaution Comments: L BKA, R toe wound, blackened heel on R Restrictions Weight Bearing Restrictions: Yes LLE Weight Bearing: Non weight bearing  Pain: Pain Assessment Pain Assessment: No/denies pain ADL: See FIM for current functional status  Therapy/Group: Individual Therapy  Allycia Pitz OTR/L 11/22/2013, 4:05 PM

## 2013-11-22 NOTE — Progress Notes (Signed)
Speech Language Pathology Daily Session Note  Patient Details  Name: [name] [name] MRN: [id number] Date of Birth: [birth year]  Today's Date: 11/22/2013 Time: 1336-1400 Time Calculation (min): 24 min  Short Term Goals: Week 1: SLP Short Term Goal 1 (Week 1): Patient will utilize external aids to assist with recall of new information with Supervision cues SLP Short Term Goal 2 (Week 1): Patient will utilize word finding strategies with Min cues SLP Short Term Goal 3 (Week 1): Patient will request help as needed with Min question cues  Skilled Therapeutic Interventions: Skilled treatment session focused on addressing wrap up education.  SLP facilitated session with discussion with wife and patient regarding recall compensatory strategies; patient able to verbalize following Supervision level verbal cues to utilize external aids.  Wife in agreement that patient's cognition appears to be close to baseline.  Education completed with home management recommendations handout.  Patient's goals are met and he is ready for SLP discharge.     FIM:  Comprehension Comprehension Mode: Auditory Comprehension: 5-Follows basic conversation/direction: With extra time/assistive device Expression Expression Mode: Verbal Expression: 5-Expresses basic 90% of the time/requires cueing < 10% of the time. Social Interaction Social Interaction: 6-Interacts appropriately with others with medication or extra time (anti-anxiety, antidepressant). Problem Solving Problem Solving: 5-Solves basic 90% of the time/requires cueing < 10% of the time Memory Memory: 5-Requires cues to use assistive device  Pain Pain Assessment Pain Assessment: No/denies pain  Therapy/Group: Individual Therapy  Carmelia Roller., [place] [phone]  [place] 11/22/2013, 2:20 PM

## 2013-11-23 ENCOUNTER — Inpatient Hospital Stay (HOSPITAL_COMMUNITY): Payer: Medicare Other | Admitting: Rehabilitation

## 2013-11-23 ENCOUNTER — Encounter (HOSPITAL_COMMUNITY): Payer: Medicare Other | Admitting: Occupational Therapy

## 2013-11-23 ENCOUNTER — Ambulatory Visit (HOSPITAL_COMMUNITY): Payer: Medicare Other | Admitting: Rehabilitation

## 2013-11-23 ENCOUNTER — Inpatient Hospital Stay (HOSPITAL_COMMUNITY): Payer: Medicare Other | Admitting: Occupational Therapy

## 2013-11-23 DIAGNOSIS — I739 Peripheral vascular disease, unspecified: Secondary | ICD-10-CM

## 2013-11-23 DIAGNOSIS — L98499 Non-pressure chronic ulcer of skin of other sites with unspecified severity: Secondary | ICD-10-CM

## 2013-11-23 DIAGNOSIS — M869 Osteomyelitis, unspecified: Secondary | ICD-10-CM

## 2013-11-23 DIAGNOSIS — E1149 Type 2 diabetes mellitus with other diabetic neurological complication: Secondary | ICD-10-CM

## 2013-11-23 DIAGNOSIS — E1142 Type 2 diabetes mellitus with diabetic polyneuropathy: Secondary | ICD-10-CM

## 2013-11-23 DIAGNOSIS — S88119A Complete traumatic amputation at level between knee and ankle, unspecified lower leg, initial encounter: Secondary | ICD-10-CM

## 2013-11-23 DIAGNOSIS — I5022 Chronic systolic (congestive) heart failure: Secondary | ICD-10-CM

## 2013-11-23 LAB — GLUCOSE, CAPILLARY
GLUCOSE-CAPILLARY: 126 mg/dL — AB (ref 70–99)
GLUCOSE-CAPILLARY: 98 mg/dL (ref 70–99)
Glucose-Capillary: 112 mg/dL — ABNORMAL HIGH (ref 70–99)
Glucose-Capillary: 147 mg/dL — ABNORMAL HIGH (ref 70–99)

## 2013-11-23 NOTE — Patient Care Conference (Signed)
Inpatient RehabilitationTeam Conference and Plan of Care Update Date: 11/23/2013   Time: 11;05 Am    Patient Name: [name] [name] Record Number: [id number]  Date of Birth: [birth year] Sex: Male         Room/Bed: 4M01C/4M01C-01 Payor Info: Payor: [employer] / Plan: [employer] MEDICARE COMPLETE / Product Type: *No Product type* /    Admitting Diagnosis: R BKA  Admit Date/Time:  11/14/2013  3:43 PM Admission Comments: No comment available   Primary Diagnosis:  <principal problem not specified> Principal Problem: <principal problem not specified>  Patient Active Problem List   Diagnosis Date Noted  . S/P BKA (below knee amputation) 11/14/2013  . Acute blood loss anemia 11/11/2013  . Type II or unspecified type diabetes mellitus 11/09/2013  . Lower limb amputation, great toe 10/11/2013  . Lower limb amputation, other toe(s) 10/11/2013  . Chronic osteomyelitis of toe of left foot 10/04/2013  . Foot osteomyelitis, left 10/04/2013  . Uncontrolled pain, Lt toe 09/21/2013  . Gangrenous toe, Lt toe 09/21/2013  . PAD (peripheral artery disease) 09/13/2013  . Diabetes mellitus 09/13/2013  . Hyperlipidemia 09/13/2013  . PAF (paroxysmal atrial fibrillation) 09/13/2013  . Critical lower limb ischemia 08/30/2013  . 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 HTN (hypertension) 04/08/2011  . NICM- EF 20-25% echo 6/14 09/29/2008  . LBBB 09/29/2008  . SYSTOLIC HEART FAILURE, CHRONIC 09/29/2008    Expected Discharge Date: Expected Discharge Date: 11/28/13  Team Members Present: Physician leading conference: Dr. Alger Simons Social Worker Present: Ovidio Kin, LCSW Nurse Present: Nanine Means, RN PT Present: Cameron Sprang, PT OT Present: Simonne Come, Starling Manns, Maryella Shivers, OT SLP Present: Gunnar Fusi, SLP PPS Coordinator present : Daiva Nakayama, RN, CRRN     Current Status/Progress Goal Weekly Team Focus  Medical   improved  mentation. wounds stable/healing, pain better  impropve functional capacity for therapies  wound care, safety, prosthetic education   Bowel/Bladder   cont of bowel and bladder; uses urinal at night; senekot BID; LBM 3/30  remain cont of bowel and bladder  monitor for constipation/diarrhea   Swallow/Nutrition/ Hydration     WFL        ADL's   Pt is supervision for UB selfcare and min assist for LB selfcare and functional transfers.    overall supervision level   selfcare retraining, balance retraining, transfer training,  DME and AE education, pt/family education   Mobility   Pt is currently supervision for bed mobility in ADL apt, supervision to min/guard for standing and transfers (depending on fatigue),  and min/guard to close supervision for short distances of ambulation (up to 30').  Overall pt doing much better with transfers, sequencing during mobility, esp with transition of hands when standing.   supervision overall  transfers, activity tolerance, balance, w/c mobility, pt/family education   Communication   Supervision-Mod I   Supervision   education complete goals met   Safety/Cognition/ Behavioral Observations  Supervision   Supervision   education complete goals met   Pain   occasional surgical pain L BKA, PRN oxy $Remov'10mg'fpteIi$  q3h effective  pain level < 3/10  assess pain qshift and prn, medicate as needed   Skin   L BKA with surgical/coban dsg; R heel DTI with allevyn dsg and PRAFO boot at night; R toe ulcerations with silvadene dsg  incision/wounds heal with no infection; no further skin breakdown  assess skin; change dressing as ordered;  continue PRAFO boot to R leg at HS      *See Care Plan and progress notes for long and short-term goals.  Barriers to Discharge: pain/wound care, safety    Possible Resolutions to Barriers:  supervision at home,     Discharge Planning/Teaching Needs:  Wife plans to take pt home and priovide supervision level-pt clearing cognitively       Team Discussion:  Making progress toward goals-wife aware will need 24 hr supervision at discharge.  Wound on bottom monitoring  Revisions to Treatment Plan:  None   Continued Need for Acute Rehabilitation Level of Care: The patient requires daily medical management by a physician with specialized training in physical medicine and rehabilitation for the following conditions: Daily direction of a multidisciplinary physical rehabilitation program to ensure safe treatment while eliciting the highest outcome that is of practical value to the patient.: Yes Daily medical management of patient stability for increased activity during participation in an intensive rehabilitation regime.: Yes Daily analysis of laboratory values and/or radiology reports with any subsequent need for medication adjustment of medical intervention for : Post surgical problems;Neurological problems  Elease Hashimoto 11/23/2013, 3:19 PM

## 2013-11-23 NOTE — Progress Notes (Signed)
Occupational Therapy Weekly Progress Note  Patient Details  Name: [name] [name] MRN: [id number] Date of Birth: Jul 08, 1946  Today's Date: 11/23/2013 Time: 7618-4859 Time Calculation (min): 45 min  Patient has met 4 of 4 short term goals.  Pt is overall min assist level for all LB selfcare, sit to stand and functional transfers.  He is able to perform better transitions with his sit to stand from pushing up from the surface to advancing his LEs to the walker.  Initially he needed max assist for this last week but now has improved to a min assist.  He does continue to need min to mod instructional cueing for technique and hand placement during the transfers.  He still needs continued work on standing balance, safety, and endurance as currently he can ambulate short distances but fatigues quickly.  Feel he is on target for discharge 11/28/13 at supervision level goals.  Recommend continued comprehensive OT in preparation for discharge.    Patient continues to demonstrate the following deficits: decreased balance, decreased endurance, decreased safety, and therefore will continue to benefit from skilled OT intervention to enhance overall performance with BADL.  Patient progressing toward long term goals..  Continue plan of care.  OT Short Term Goals Week 2:  OT Short Term Goal 1 (Week 2): Pt will continue working toward [place] set at supervision.  Skilled Therapeutic Interventions/Progress Updates:    Pt performed bathing and dressing sit to stand at the sink.  He was able to transfer to the wheelchair from bed stand pivot with min assist level.  Mr. Linsey performed UB bathing and dressing with supervision level and LB bathing and dressing with min assist.  He still needs min assist level for sit to stand and for standing balance when washing peri area and pulling pants over his hips.  He declined wanting to wash his right LE and foot this session however he was able to doff and donn his shoe in order  to donn his underpants and shorts.  .  Therapy Documentation Precautions:  Precautions Precautions: Fall Precaution Comments: L BKA, R toe wound, blackened heel on R Restrictions Weight Bearing Restrictions: Yes LLE Weight Bearing: Non weight bearing  Pain: Pain Assessment Pain Assessment: No/denies pain Pain Score: 0-No pain Patients Stated Pain Goal: 3 Multiple Pain Sites: No ADL: See FIM for current functional status  Therapy/Group: Individual Therapy  Imojean Yoshino OTR/L 11/23/2013, 10:36 AM

## 2013-11-23 NOTE — Progress Notes (Signed)
Speech Language Pathology Discharge Summary  Patient Details  Name: [name] [name] MRN: [id number] Date of Birth: 29-Jun-1946  Today's Date: 11/23/2013  Patient has met 3 of 3 long term goals.  Patient to discharge at overall Supervision level.  Reasons goals not met: n/a   Clinical Impression/Discharge Summary: Patient has made cognitive gains and as a result has met 3 of 3 long term goals during this CIR admission.  Patient requires Supervision from wife due to new memory impairments and high level cognitive impairments at baseline.  Given improved processing, recall with repetition and ability to retrieve information; as a result patient is being discharged.  Education with patient and spouse completed and no further skilled SLP services are warranted at this time.    Care Partner:  Caregiver Able to Provide Assistance: Yes  Type of Caregiver Assistance: Physical;Cognitive  Recommendation:  24 hour supervision/assistance  Rationale for SLP Follow Up:  (n/a)   Equipment: none   Reasons for discharge: Treatment goals met   Patient/Family Agrees with Progress Made and Goals Achieved: Yes   See FIM for current functional status  Carmelia Roller., CCC-SLP [phone]  [place] 11/23/2013, 10:50 AM

## 2013-11-23 NOTE — Progress Notes (Signed)
68 y.o. right-handed male with history of diabetes mellitus or peripheral neuropathy, chronic systolic congestive heart failure, PAF with implantable cardioverter defibrillator. Patient lives with his wife uses a walker and wheelchair prior to admission. Admitted 11/09/2013 with left foot osteomyelitis and nonhealing ulcers. Limb was not felt to be salvageable. 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 Postoperative pain management. Postoperative anemia 8.0 and transfuse 11/11/2013. Close monitoring right foot wound with right toe ulceration and blackened heel. Bouts of confusion  Subjective/Complaints: No new problems. Pain under control currently. Slept well Review of Systems - otherwise neg  Objective: Vital Signs: Blood pressure 110/69, pulse 86, temperature 98.7 F (37.1 C), temperature source Oral, resp. rate 18, height 5\' 9"  (1.753 m), weight 76.114 kg (167 lb 12.8 oz), SpO2 98.00%. No results found. Results for orders placed during the hospital encounter of 11/14/13 (from the past 72 hour(s))  GLUCOSE, CAPILLARY     Status: Abnormal   Collection Time    11/20/13 11:23 AM      Result Value Ref Range   Glucose-Capillary 194 (*) 70 - 99 mg/dL  GLUCOSE, CAPILLARY     Status: Abnormal   Collection Time    11/20/13  4:46 PM      Result Value Ref Range   Glucose-Capillary 114 (*) 70 - 99 mg/dL  GLUCOSE, CAPILLARY     Status: Abnormal   Collection Time    11/20/13  9:17 PM      Result Value Ref Range   Glucose-Capillary 237 (*) 70 - 99 mg/dL  GLUCOSE, CAPILLARY     Status: None   Collection Time    11/21/13  7:15 AM      Result Value Ref Range   Glucose-Capillary 87  70 - 99 mg/dL  GLUCOSE, CAPILLARY     Status: Abnormal   Collection Time    11/21/13 11:24 AM      Result Value Ref Range   Glucose-Capillary 126 (*) 70 - 99 mg/dL  GLUCOSE, CAPILLARY     Status: Abnormal   Collection Time    11/21/13  4:37 PM      Result Value Ref Range   Glucose-Capillary 171  (*) 70 - 99 mg/dL  GLUCOSE, CAPILLARY     Status: Abnormal   Collection Time    11/21/13  9:28 PM      Result Value Ref Range   Glucose-Capillary 160 (*) 70 - 99 mg/dL  GLUCOSE, CAPILLARY     Status: Abnormal   Collection Time    11/22/13  7:31 AM      Result Value Ref Range   Glucose-Capillary 134 (*) 70 - 99 mg/dL   Comment 1 Notify RN    GLUCOSE, CAPILLARY     Status: Abnormal   Collection Time    11/22/13 11:42 AM      Result Value Ref Range   Glucose-Capillary 145 (*) 70 - 99 mg/dL  GLUCOSE, CAPILLARY     Status: Abnormal   Collection Time    11/22/13  4:47 PM      Result Value Ref Range   Glucose-Capillary 118 (*) 70 - 99 mg/dL  GLUCOSE, CAPILLARY     Status: Abnormal   Collection Time    11/22/13  8:32 PM      Result Value Ref Range   Glucose-Capillary 169 (*) 70 - 99 mg/dL   Comment 1 Notify RN       HEENT: poor dentition Cardio: irregular and no murmur Resp: CTA B/L and unlabored  GI: BS positive and NT,ND Extremity:  Pulses positive and No Edema Skin:   Wound Left BK with Coban, , R lat great toe PIP area ulcer--some fibronecrotic debris still in area. Left heel with minimal, chronic breakdown  Erythema over sacrum no skin tear Neuro: Alert/Oriented, Cranial Nerve II-XII normal and Abnormal Motor UE 4/5 , RLE 4/5 except R ankle in PRAFO, Left HF 4/5 Musc/Skel:  Other Left BKA Gen NAD   Assessment/Plan: 1. Functional deficits secondary to Left BKA for osteomyelitis which require 3+ hours per day of interdisciplinary therapy in a comprehensive inpatient rehab setting. Physiatrist is providing close team supervision and 24 hour management of active medical problems listed below. Physiatrist and rehab team continue to assess barriers to discharge/monitor patient progress toward functional and medical goals.  Target dc date 4/6   FIM: FIM - Bathing Bathing Steps Patient Completed: Chest;Right Arm;Left Arm;Abdomen;Right upper leg;Left upper leg;Right lower leg  (including foot);Front perineal area;Buttocks Bathing: 4: Steadying assist  FIM - Upper Body Dressing/Undressing Upper body dressing/undressing steps patient completed: Thread/unthread right sleeve of pullover shirt/dresss;Thread/unthread left sleeve of pullover shirt/dress;Put head through opening of pull over shirt/dress;Pull shirt over trunk Upper body dressing/undressing: 5: Set-up assist to: Obtain clothing/put away FIM - Lower Body Dressing/Undressing Lower body dressing/undressing steps patient completed: Thread/unthread right pants leg;Thread/unthread left pants leg;Pull pants up/down;Thread/unthread right underwear leg;Thread/unthread left underwear leg;Pull underwear up/down Lower body dressing/undressing: 4: Min-Patient completed 75 plus % of tasks  FIM - Toileting Toileting steps completed by patient: Performs perineal hygiene Toileting Assistive Devices: Grab bar or rail for support Toileting: 0: Activity did not occur  FIM - Radio producer Devices: Grab bars Toilet Transfers: 0-Activity did not occur  FIM - Control and instrumentation engineer Devices: Arm rests;Walker Bed/Chair Transfer: 4: Chair or W/C > Bed: Min A (steadying Pt. > 75%)  FIM - Locomotion: Wheelchair Distance: 100 Locomotion: Wheelchair: 2: Travels 24 - 149 ft with supervision, cueing or coaxing FIM - Locomotion: Ambulation Locomotion: Ambulation Assistive Devices: Administrator Ambulation/Gait Assistance: 4: Min guard;5: Supervision Locomotion: Ambulation: 1: Travels less than 50 ft with minimal assistance (Pt.>75%)  Comprehension Comprehension Mode: Auditory Comprehension: 4-Understands basic 75 - 89% of the time/requires cueing 10 - 24% of the time  Expression Expression Mode: Verbal Expression: 5-Expresses basic 90% of the time/requires cueing < 10% of the time.  Social Interaction Social Interaction Mode: Asleep Social Interaction: 5-Interacts  appropriately 90% of the time - Needs monitoring or encouragement for participation or interaction.  Problem Solving Problem Solving: 5-Solves basic 90% of the time/requires cueing < 10% of the time  Memory Memory: 5-Recognizes or recalls 90% of the time/requires cueing < 10% of the time  Medical Problem List and Plan:  1. 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 DVT Prophylaxis/Anticoagulation: SCDs right lower extremity  3. Pain Management: Lyrica 75 mg twice a day, Oxycodone 10mg  and Robaxin as needed with reasonable control.  4. Neuropsych: This patient is capable of making decisions on his own behalf.Still confused , balance pain relief with  confusion 5. Acute blood loss anemia. Continue Niferex tablet daily. 6. Right foot wound. Right toe ulceration  Silvadene twice a day applied to the right toe wounds.   -adaptive shoe for right foot to help with weight bearing 7. Diabetes mellitus with peripheral neuropathy. Hemoglobin A1c 8.6. Glucophage 500 mg twice a day, Levemir 35 units each bedtime and NovoLog 3 units 3 times a day.  --sugars appear to be  balancing out 8. Chronic systolic congestive heart failure. Lasix 40 mg daily. Monitor for any signs of fluid overload  9. PAF with implantable cardioverter defibrillator. Cardiac rate control. Her chest pain or shortness of breath.  10. Hypertension. Coreg 12.5 mg twice a day.   11. Hyperlipidemia. Lipitor 12.  Grade 1 decubitus over sacrum/wound care: side lying and OOB, sacral foam prophyllactic drsg continues  -right foot wounds stable, clean--continue current dressing  -left BK---can remove post-op dressings today  LOS (Days) 9 A FACE TO FACE EVALUATION WAS PERFORMED  [name] [name] 11/23/2013, 7:55 AM

## 2013-11-23 NOTE — Progress Notes (Signed)
Social Work Patient ID: [name] [name], male   DOB: 26-May-1946, 68 y.o.   MRN: [id number] Met with pt and wife who are aware of pt's discharge 4/6.  Informed of team conference progressing toward goals and readiness for discharge. Pt feels good about going home on Monday.

## 2013-11-23 NOTE — Progress Notes (Signed)
Occupational Therapy Session Note  Patient Details  Name: [name] [name] MRN: [id number] Date of Birth: 01/03/1946  Today's Date: 11/23/2013 Time: 5638-7564 Time Calculation (min): 30 min  Skilled Therapeutic Interventions/Progress Updates:    Pt transferred to the wheelchair with min assist level using the RW.  He rolled himself down to the ADL apartment with supervision using the RW.  Pt practiced with min guard assist to walk into the bathroom using the RW.  Bedside commode placed over the elevated toilet.  Pt worked on standing sit to stand with min guard assist for simulated clothing management as well.  He was able to push his pants down and pull them up in standing while alternating hands.  Ambulated back out to the EOB with min guard as well.  Finished session by having pt ambulate and step over simulated shower edge using the RW.  Pt needing min assist and mod instructional cueing for sequence walker and to step over the edge X 2.  Pt transferred back to the wheelchair and rolled himself back to his room with supervision.  Pt transferred to recliner with min guard using the walker.  Call bell and phone left near pt.    Therapy Documentation Precautions:  Precautions Precautions: Fall Precaution Comments: L BKA, R toe wound, blackened heel on R Restrictions Weight Bearing Restrictions: Yes LLE Weight Bearing: Non weight bearing  Pain: Pain Assessment Pain Assessment: No/denies pain ADL: See FIM for current functional status  Therapy/Group: Individual Therapy  [name] OTR/L 11/23/2013, 4:20 PM

## 2013-11-23 NOTE — Plan of Care (Signed)
Problem: RH BLADDER ELIMINATION Goal: RH STG MANAGE BLADDER WITH MEDICATION WITH ASSISTANCE STG Manage Bladder With Medication With min Assistance.  Outcome: Not Applicable Date Met:  27/51/70 Not on any bladder related medication

## 2013-11-23 NOTE — Progress Notes (Signed)
Physical Therapy Weekly Progress Note  Patient Details  Name: [name] [name] MRN: [id number] Date of Birth: 10-Jun-1946  Today's Date: 11/23/2013 Time: 1100-1200 and  Time Calculation (min): 60 min and   Patient has met 5 of 5 short term goals.  Pt making very good progress with all aspects of mobility and is now able to perform bed mobility at supervision, sit <> stand and transfers at min/guard to close supervision level (needs min/guard when more fatigued), and min/guard to close supervision for short distances of ambulation.  He has also made marked improvement in his activity tolerance as well and is able to stand at up to 15 mins at a time at close supervision level.  Will continue to work on functional transfers, gait, standing tolerance, activity tolerance, and overall strengthening, as well as getting wife involved in family education for safe D/C.   Patient continues to demonstrate the following deficits: decreased activity tolerance/endurance, decreased strength, decreased balance, decreased safety awareness and therefore will continue to benefit from skilled PT intervention to enhance overall performance with activity tolerance, balance, ability to compensate for deficits, attention, awareness, coordination and knowledge of precautions.  Patient progressing toward long term goals..  Continue plan of care.  PT Short Term Goals Week 1:  PT Short Term Goal 1 (Week 1): Pt will perform bed mobility with HOB flat and without rails at supervision level in timely manner PT Short Term Goal 1 - Progress (Week 1): Met PT Short Term Goal 2 (Week 1): Pt will perform sit<> stand with RW at min assist level consistently PT Short Term Goal 2 - Progress (Week 1): Met PT Short Term Goal 3 (Week 1): Pt will perform stand pivot transfer with RW at min assist level PT Short Term Goal 3 - Progress (Week 1): Met PT Short Term Goal 4 (Week 1): Pt will perform dynamic standing balance with RW x 5 mins at  min assist to increase activity tolerance to complete ADL's.  PT Short Term Goal 4 - Progress (Week 1): Met PT Short Term Goal 5 (Week 1): Pt will ambulate 41' with RW at min assist consistently while demonstrating increased safety Week 2:  PT Short Term Goal 1 (Week 2): =LTG's due to ELOS  Skilled Therapeutic Interventions/Progress Updates:   AM session:  Group therapy session focused on functional transfers with sit<> stand to RW, standing tolerance with horse shoe toss x 5 mins, seated nustep x 8 mins with single rest break with BUEs/RLE at level 3 resistance and also seated therex with LAQ's x 10 reps each with 5 sec hold for overall strengthening and activity tolerance.  Pt tolerated well and was assisted back to room and left in w/c with all needs in reach and L residual limb supported. Also discussed that during conference it was decided that dressing will be removed.   PM session:  Pt received sitting in w/c in room, agreeable to therapy.  Had pt self propel himself all throughout session in controlled environment and in ADL apt to better simulate home.  He was able to perform >150' at supervision level.  Had pt perform car transfer stand pivot from w/c w/ RW at min/guard level.  Pt able to elevate BLEs into car on his own with min cues for safety.  Propelled into ADL apt to perform stand pivot transfer to sofa to better simulate home and to challenge sit <> stand from lower surface.  Pt able to perform at min/guard level with continued cues  for forward weight shift and quick transition of hands to RW.  Propelled to therapy gym hallway and ascended/descended ramp with w/c x 2 reps at min/guard assist.  Pt has difficulty ascending due to decreased UE strength, however only provided min/guard for safety with cues for increased size of propulsion during task.  Ended session with 40' x 1 gait training with RW at min/guard assist.  Note that as pt fatigues, his steps tend to get smaller and requires  increased cues for keeping RW closer to him.  Pt returned to chair and transferred back to bed at min/guard level.  Pt left in bed with all needs in reach and ortho techs present to remove L residual limb dressing.  RN aware.   Therapy Documentation Precautions:  Precautions Precautions: Fall Precaution Comments: L BKA, R toe wound, blackened heel on R Restrictions Weight Bearing Restrictions: Yes LLE Weight Bearing: Non weight bearing   Pain: Pain Assessment Pain Assessment: No/denies pain  See FIM for current functional status  Therapy/Group: Individual Therapy  Denice Bors 11/23/2013, 12:15 PM

## 2013-11-24 ENCOUNTER — Encounter (HOSPITAL_COMMUNITY): Payer: Medicare Other | Admitting: Occupational Therapy

## 2013-11-24 ENCOUNTER — Inpatient Hospital Stay (HOSPITAL_COMMUNITY): Payer: Medicare Other | Admitting: Rehabilitation

## 2013-11-24 ENCOUNTER — Inpatient Hospital Stay (HOSPITAL_COMMUNITY): Payer: Medicare Other | Admitting: Occupational Therapy

## 2013-11-24 DIAGNOSIS — E1149 Type 2 diabetes mellitus with other diabetic neurological complication: Secondary | ICD-10-CM

## 2013-11-24 DIAGNOSIS — L98499 Non-pressure chronic ulcer of skin of other sites with unspecified severity: Secondary | ICD-10-CM

## 2013-11-24 DIAGNOSIS — S88119A Complete traumatic amputation at level between knee and ankle, unspecified lower leg, initial encounter: Secondary | ICD-10-CM

## 2013-11-24 DIAGNOSIS — I5022 Chronic systolic (congestive) heart failure: Secondary | ICD-10-CM

## 2013-11-24 DIAGNOSIS — E1142 Type 2 diabetes mellitus with diabetic polyneuropathy: Secondary | ICD-10-CM

## 2013-11-24 DIAGNOSIS — I739 Peripheral vascular disease, unspecified: Secondary | ICD-10-CM

## 2013-11-24 DIAGNOSIS — M869 Osteomyelitis, unspecified: Secondary | ICD-10-CM

## 2013-11-24 LAB — GLUCOSE, CAPILLARY
GLUCOSE-CAPILLARY: 103 mg/dL — AB (ref 70–99)
GLUCOSE-CAPILLARY: 156 mg/dL — AB (ref 70–99)
Glucose-Capillary: 101 mg/dL — ABNORMAL HIGH (ref 70–99)
Glucose-Capillary: 125 mg/dL — ABNORMAL HIGH (ref 70–99)

## 2013-11-24 MED ORDER — JUVEN PO PACK: 1.0000 | PACK | Freq: Two times a day (BID) | ORAL | Status: DC

## 2013-11-24 NOTE — Progress Notes (Signed)
68 y.o. right-handed male with history of diabetes mellitus or peripheral neuropathy, chronic systolic congestive heart failure, PAF with implantable cardioverter defibrillator. Patient lives with his wife uses a walker and wheelchair prior to admission. Admitted 11/09/2013 with left foot osteomyelitis and nonhealing ulcers. Limb was not felt to be salvageable. 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 Postoperative pain management. Postoperative anemia 8.0 and transfuse 11/11/2013. Close monitoring right foot wound with right toe ulceration and blackened heel. Bouts of confusion  Subjective/Complaints: Fingers get stiff mainly at noc Review of Systems - otherwise neg  Objective: Vital Signs: Blood pressure 107/61, pulse 90, temperature 98.2 F (36.8 C), temperature source Oral, resp. rate 19, height 5\' 9"  (1.753 m), weight 76.9 kg (169 lb 8.5 oz), SpO2 100.00%. No results found. Results for orders placed during the hospital encounter of 11/14/13 (from the past 72 hour(s))  GLUCOSE, CAPILLARY     Status: None   Collection Time    11/21/13  7:15 AM      Result Value Ref Range   Glucose-Capillary 87  70 - 99 mg/dL  GLUCOSE, CAPILLARY     Status: Abnormal   Collection Time    11/21/13 11:24 AM      Result Value Ref Range   Glucose-Capillary 126 (*) 70 - 99 mg/dL  GLUCOSE, CAPILLARY     Status: Abnormal   Collection Time    11/21/13  4:37 PM      Result Value Ref Range   Glucose-Capillary 171 (*) 70 - 99 mg/dL  GLUCOSE, CAPILLARY     Status: Abnormal   Collection Time    11/21/13  9:28 PM      Result Value Ref Range   Glucose-Capillary 160 (*) 70 - 99 mg/dL  GLUCOSE, CAPILLARY     Status: Abnormal   Collection Time    11/22/13  7:31 AM      Result Value Ref Range   Glucose-Capillary 134 (*) 70 - 99 mg/dL   Comment 1 Notify RN    GLUCOSE, CAPILLARY     Status: Abnormal   Collection Time    11/22/13 11:42 AM      Result Value Ref Range   Glucose-Capillary 145 (*)  70 - 99 mg/dL  GLUCOSE, CAPILLARY     Status: Abnormal   Collection Time    11/22/13  4:47 PM      Result Value Ref Range   Glucose-Capillary 118 (*) 70 - 99 mg/dL  GLUCOSE, CAPILLARY     Status: Abnormal   Collection Time    11/22/13  8:32 PM      Result Value Ref Range   Glucose-Capillary 169 (*) 70 - 99 mg/dL   Comment 1 Notify RN    GLUCOSE, CAPILLARY     Status: Abnormal   Collection Time    11/23/13  7:27 AM      Result Value Ref Range   Glucose-Capillary 126 (*) 70 - 99 mg/dL   Comment 1 Notify RN    GLUCOSE, CAPILLARY     Status: None   Collection Time    11/23/13 11:59 AM      Result Value Ref Range   Glucose-Capillary 98  70 - 99 mg/dL   Comment 1 Notify RN    GLUCOSE, CAPILLARY     Status: Abnormal   Collection Time    11/23/13  4:44 PM      Result Value Ref Range   Glucose-Capillary 112 (*) 70 - 99 mg/dL   Comment 1  Notify RN    GLUCOSE, CAPILLARY     Status: Abnormal   Collection Time    11/23/13  8:30 PM      Result Value Ref Range   Glucose-Capillary 147 (*) 70 - 99 mg/dL   Comment 1 Notify RN       HEENT: poor dentition Cardio: irregular and no murmur Resp: CTA B/L and unlabored GI: BS positive and NT,ND Extremity:  Pulses positive and No Edema Skin:   Wound Left BK with Coban, , R lat great toe PIP area ulcer--some fibronecrotic debris still in area. Left heel with minimal, chronic breakdown  Erythema over sacrum no skin tear Neuro: Alert/Oriented, Cranial Nerve II-XII normal and Abnormal Motor UE 4/5 , RLE 4/5 except R ankle in PRAFO, Left HF 4/5, numbness both hand s in all fingers Musc/Skel:  Other Left BKA Gen NAD   Assessment/Plan: 1. Functional deficits secondary to Left BKA for osteomyelitis which require 3+ hours per day of interdisciplinary therapy in a comprehensive inpatient rehab setting. Physiatrist is providing close team supervision and 24 hour management of active medical problems listed below. Physiatrist and rehab team continue  to assess barriers to discharge/monitor patient progress toward functional and medical goals.  Target dc date 4/6   FIM: FIM - Bathing Bathing Steps Patient Completed: Chest;Right Arm;Left Arm;Abdomen;Right upper leg;Left upper leg;Front perineal area;Buttocks Bathing: 4: Steadying assist  FIM - Upper Body Dressing/Undressing Upper body dressing/undressing steps patient completed: Thread/unthread right sleeve of pullover shirt/dresss;Thread/unthread left sleeve of pullover shirt/dress;Put head through opening of pull over shirt/dress;Pull shirt over trunk Upper body dressing/undressing: 5: Set-up assist to: Obtain clothing/put away FIM - Lower Body Dressing/Undressing Lower body dressing/undressing steps patient completed: Thread/unthread right pants leg;Thread/unthread left pants leg;Pull pants up/down;Thread/unthread right underwear leg;Thread/unthread left underwear leg;Pull underwear up/down Lower body dressing/undressing: 4: Min-Patient completed 75 plus % of tasks  FIM - Toileting Toileting steps completed by patient: Adjust clothing after toileting;Adjust clothing prior to toileting Toileting Assistive Devices: Grab bar or rail for support Toileting: 4: Steadying assist  FIM - Radio producer Devices: Nurse, learning disability Transfers: 5-To toilet/BSC: Supervision (verbal cues/safety issues);5-From toilet/BSC: Supervision (verbal cues/safety issues)  FIM - Control and instrumentation engineer Devices: Copy: 5: Supine > Sit: Supervision (verbal cues/safety issues);4: Bed > Chair or W/C: Min A (steadying Pt. > 75%)  FIM - Locomotion: Wheelchair Distance: 100 Locomotion: Wheelchair: 2: Travels 50 - 149 ft with supervision, cueing or coaxing FIM - Locomotion: Ambulation Locomotion: Ambulation Assistive Devices: Administrator Ambulation/Gait Assistance: 4: Min guard;5: Supervision Locomotion: Ambulation: 1:  Travels less than 50 ft with minimal assistance (Pt.>75%)  Comprehension Comprehension Mode: Auditory Comprehension: 5-Understands complex 90% of the time/Cues < 10% of the time  Expression Expression Mode: Verbal Expression: 5-Expresses basic 90% of the time/requires cueing < 10% of the time.  Social Interaction Social Interaction Mode: Asleep Social Interaction: 5-Interacts appropriately 90% of the time - Needs monitoring or encouragement for participation or interaction.  Problem Solving Problem Solving: 5-Solves basic 90% of the time/requires cueing < 10% of the time  Memory Memory: 6-More than reasonable amt of time  Medical Problem List and Plan:  1. 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 DVT Prophylaxis/Anticoagulation: SCDs right lower extremity  3. Pain Management: Lyrica 75 mg twice a day, Oxycodone 10mg  and Robaxin as needed with reasonable control.  4. Neuropsych: This patient is capable of making decisions on his own behalf.Still confused , balance pain relief  with  confusion 5. Acute blood loss anemia. Continue Niferex tablet daily. 6. Right foot wound. Right toe ulceration  Silvadene twice a day applied to the right toe wounds.   -adaptive shoe for right foot to help with weight bearing 7. Diabetes mellitus with peripheral neuropathy. Hemoglobin A1c 8.6. Glucophage 500 mg twice a day, Levemir 35 units each bedtime and NovoLog 3 units 3 times a day.  --sugars appear to be balancing out 8. Chronic systolic congestive heart failure. Lasix 40 mg daily. Monitor for any signs of fluid overload  9. PAF with implantable cardioverter defibrillator. Cardiac rate control. Her chest pain or shortness of breath.  10. Hypertension. Coreg 12.5 mg twice a day.   11. Hyperlipidemia. Lipitor 12.  Grade 1 decubitus over sacrum/wound care: side lying and OOB, sacral foam prophyllactic drsg continues  -right foot wounds stable, clean--continue current dressing  -left BK---can  remove post-op dressings today 13.  Finger numbness/stiffness at noc CTS vs progression of PN, trial wrist splints at hs LOS (Days) 10 A FACE TO FACE EVALUATION WAS PERFORMED  KIRSTEINS,ANDREW E 11/24/2013, 6:26 AM

## 2013-11-24 NOTE — Progress Notes (Signed)
Orthopedic Tech Progress Note Patient Details:  [name] [name] 22-Feb-1946 [id number] Advanced called to place brace order Patient ID: [name], male   DOB: 03-18-1946, 68 y.o.   MRN: [id number]   [name] 11/24/2013, 10:22 AM

## 2013-11-24 NOTE — Progress Notes (Signed)
Orthopedic Tech Progress Note Patient Details:  CHETAN MEHRING Aug 24, 1946 [id number]  Patient ID: [name] [name], male   DOB: 11/05/1945, 68 y.o.   MRN: [id number] Brace order completed by Raynelle Jan, Kanaan Kagawa 11/24/2013, 4:22 PM

## 2013-11-24 NOTE — Progress Notes (Signed)
Physical Therapy Session Note  Patient Details  Name: [name] [name] MRN: [id number] Date of Birth: 12-18-1945  Today's Date: 11/24/2013 Time: 8338-2505 Time Calculation (min): 43 min  Short Term Goals: Week 2:  PT Short Term Goal 1 (Week 2): =LTG's due to ELOS  Skilled Therapeutic Interventions/Progress Updates:   Pt received lying in bed this morning, agreeable to therapy.  Wife present during session, therefore initiated verbal family education, but also discussed having her come in on Monday prior to D/C in order to take pt through all mobility for increased safety.  Pt performed bed mobility at supervision.  Once at [facility], performed 15' gait training with RW at min/guard to close supervision level.  Discussed where wife should be when ambulating with pt, as goals are supervision, however educated on having her hands right behind him in case of LOB.  Pt then returned to chair and self propelled to ortho gym in order to perform stand pivot transfer to car.  Requires min/guard during transfer while PT educated that he continues to require cues for foot placement and sometimes assist to shift forward with fast transition of hands.  Pt able to get LEs into car on his own.  Pt then propelled down to therapy gym in order to perform ramp.  Performed at min/guard level (light touch on w/c for safety).  Discussed that she may need to assist back wheels onto ramp, however would like him to perform bulk of ramp.  Also discussed limb wrapping (figure 8), desensitization, maintaining limb in extension, short distance amb vs using w/c when fatigued.  Pts and wife verbalized understanding.  Ended session with gait x 45' with RW at min/guard to close supervision.  Pt returned to room and sat in w/c.  Left in w/c with all needs in reach and educated pt on board to maintain L residual limb in ext while in w/c.  Verbalized understanding.   Therapy Documentation Precautions:  Precautions Precautions:  Fall Precaution Comments: L BKA, R toe wound, blackened heel on R Restrictions Weight Bearing Restrictions: Yes LLE Weight Bearing: Non weight bearing   Pain: Pain Assessment Pain Assessment: No/denies pain   Locomotion : Ambulation Ambulation/Gait Assistance: 4: Min guard;5: Supervision Wheelchair Mobility Distance: 150   See FIM for current functional status  Therapy/Group: Individual Therapy  Denice Bors 11/24/2013, 12:16 PM

## 2013-11-24 NOTE — Progress Notes (Signed)
Patient seen and incision inspected. Incision c/d/i Patient's spirits are up and happy. F/u next Thursday in office for suture removal.  Azucena Cecil, MD [facility] [phone] 12:50 PM

## 2013-11-24 NOTE — Plan of Care (Signed)
Problem: RH SKIN INTEGRITY Goal: RH STG ABLE TO PERFORM INCISION/WOUND CARE W/ASSISTANCE STG Able To Perform Incision/Wound Care With max Assistance.  Outcome: Progressing Wife observed dressing changes to Left BKA  and right foot care

## 2013-11-24 NOTE — Progress Notes (Signed)
Occupational Therapy Session Note  Patient Details  Name: DAIRON PROCTER MRN: [id number] Date of Birth: [birth year]  Today's Date: 11/24/2013 Time: 0973-5329 Time Calculation (min): 56 min  Short Term Goals: Week 2:  OT Short Term Goal 1 (Week 2): Pt will continue working toward [place] set at supervision.  Skilled Therapeutic Interventions/Progress Updates:    Pt performed shower this am and dressing.  He transferred to the shower seat with min assist using the RW and grab bar.  He was able to bathe sit to stand with min assist during standing while holding onto the grab bar.  Transferred back to the wheelchair with min assist as well.  All dressing performed at the sink with min guard assist for standing to pull underpants and pants over his hips.  Mod instructional cueing for hand placement to push up from the chair instead of pulling on the sink with one hand.  Pt also needed cueing to pull up one side of his pants at a time while helping to balance with the other.  He was transferred back to the bed for nursing to dress his right foot.    Therapy Documentation Precautions:  Precautions Precautions: Fall Precaution Comments: L BKA, R toe wound, blackened heel on R Restrictions Weight Bearing Restrictions: Yes LLE Weight Bearing: Non weight bearing  Pain: Pain Assessment Pain Assessment: No/denies pain ADL: See FIM for current functional status  Therapy/Group: Individual Therapy  Keidra Withers OTR/L 11/24/2013, 9:59 AM

## 2013-11-24 NOTE — Progress Notes (Signed)
INITIAL NUTRITION ASSESSMENT  DOCUMENTATION CODES Per approved criteria  -Not Applicable   INTERVENTION:  1.  Supplements; discontinue Juven as pt has completed 2 weeks on product with benefit to wound healing.  2.  Meals/snacks; add snacks BID.   NUTRITION DIAGNOSIS:  Increased nutrient needs related to wounds as evidenced by estimated needs.   Goal:  Pt to meet >/= 90% of their estimated nutrition needs   Monitor:  PO intake, weight trend, supplement acceptance, labs   Reason for Assessment: Pt identified as at nutrition risk on the Malnutrition Screen Tool  68 y.o. male  Admitting Dx: BKA  ASSESSMENT: Pt admitted for surgical treatment of left foot osteomyelitis and non-healing ulcers.  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   Discussed with RN.  Pt is hungry all the time.  Reports he eats well at meals, but is getting hungry between meals.  He is getting some snacks from nursing.  Pt has been taking Juven BID x2 weeks.  He does not like it.  Wound at BKA site is healing well.  OK to discontinue.  Will order snacks for pt from foodservice.   Height: Ht Readings from Last 1 Encounters:  11/14/13 5\' 9"  (1.753 m)    Weight: Wt Readings from Last 1 Encounters:  11/23/13 169 lb 8.5 oz (76.9 kg)    Ideal Body Weight: 67.9 kg   % Ideal Body Weight: 119%  Wt Readings from Last 10 Encounters:  11/23/13 169 lb 8.5 oz (76.9 kg)  11/09/13 177 lb 14.6 oz (80.701 kg)  11/09/13 177 lb 14.6 oz (80.701 kg)  11/08/13 177 lb 12.8 oz (80.65 kg)  09/30/13 177 lb 14.4 oz (80.695 kg)  09/21/13 181 lb (82.101 kg)  09/13/13 192 lb 7.4 oz (87.3 kg)  09/13/13 192 lb 7.4 oz (87.3 kg)  09/06/13 185 lb (83.915 kg)  08/30/13 178 lb (80.74 kg)    Usual Body Weight: 177 lb   % Usual Body Weight: 100%  BMI:  28.1 - overweight  Estimated Nutritional Needs: Kcal: 1900-2100  Protein: 95-110 grams  Fluid: 1.2 L/day  Skin:  New Left BKA Right heel incision  Diet Order: Carb  Control  EDUCATION NEEDS: -No education needs identified at this time   Intake/Output Summary (Last 24 hours) at 11/24/13 1435 Last data filed at 11/24/13 1300  Gross per 24 hour  Intake    960 ml  Output   2575 ml  Net  -1615 ml    Last BM: 3/30  Labs:  No results found for this basename: NA, K, CL, CO2, BUN, CREATININE, CALCIUM, MG, PHOS, GLUCOSE,  in the last 168 hours  CBG (last 3)   Recent Labs  11/23/13 2030 11/24/13 0715 11/24/13 1205  GLUCAP 147* 101* 103*   Lab Results  Component Value Date   HGBA1C 8.6* 11/09/2013   Scheduled Meds: . aspirin EC  325 mg Oral Daily  . atorvastatin  10 mg Oral q1800  . carvedilol  12.5 mg Oral BID WC  . clopidogrel  75 mg Oral Q breakfast  . docusate sodium  100 mg Oral BID  . furosemide  40 mg Oral Daily  . insulin aspart  0-15 Units Subcutaneous TID WC  . insulin aspart  3 Units Subcutaneous TID WC  . insulin detemir  35 Units Subcutaneous QHS  . iron polysaccharides  150 mg Oral Daily  . metFORMIN  500 mg Oral BID WC  . multivitamin with minerals  1 tablet Oral Daily  .  nutrition supplement (JUVEN)  1 packet Oral BID BM  . pantoprazole  80 mg Oral Q1200  . potassium chloride  20 mEq Oral BID  . pregabalin  75 mg Oral BID  . senna  2 tablet Oral BID  . silver sulfADIAZINE  1 application Topical BID    Continuous Infusions:    Past Medical History  Diagnosis Date  . GERD (gastroesophageal reflux disease)   . Obesity   . Cardiomyopathy, nonischemic     a. Cath 2003: mild nonobstructive CAD, EF 25% at that time.  . Hypertension   . Chronic systolic CHF (congestive heart failure)     a. NICM EF 25% dating back to at least 2003.  [name] PAF (paroxysmal atrial fibrillation)     a. Noted on ICD interrogation 2012;  b. coumadin d/c'd 01/2013.  [name] NSVT (nonsustained ventricular tachycardia)     a. Noted on ICD interrogation in 2011.  [name] LBBB (left bundle branch block)   . Kidney stone   . Critical lower limb ischemia    . High cholesterol   . PAD (peripheral artery disease)     a. 08/2013 Periph Angio/PTA: Abd Ao nl, RLE- 3v runoff, PT diff dzs, AT 90p, LLE 2v runoff, PT 100, AT 84m (diamondback ORA/chocolate balloon PTA).  . Type II diabetes mellitus   . Uncontrolled pain, Lt toe 09/21/2013  . Gangrenous toe, Lt toe 09/21/2013  . CAD (coronary artery disease)     a. Nonobstructive by cath 09/2001.  [name] Automatic implantable cardioverter-defibrillator in situ     a. 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  . Dementia   . Arthritis     Past Surgical History  Procedure Laterality Date  . Lithotripsy  2001  . Cervical spine surgery  1994  . Cardiac defibrillator placement  06/2009    WITH GENERATOR REPLACED; BiV ICD  . US echocardiography  03/21/2008    EF 30-35%  . Cardiovascular stress test  03/20/2009    EF 33%  . Transluminal atherectomy tibial artery Left 09/12/2013  . Cardiac catheterization  10/01/2001    THERE WAS GLOBAL HYPOKINESIS AND EF 25%. THERE APPEARED TO BE GLOBAL DECREASE IN WALL MOTION  . Toe amputation  10/04/2013    LEFT GREAT TOE AND 4TH TOE   /   DR Erlinda Hong  . Amputation Left 10/04/2013    Procedure: LEFT GREAT TOE AND SECOND TOE AMPUTATION;  Surgeon: Marianna Payment, MD;  Location: [facility];  Service: Orthopedics;  Laterality: Left;  . Colonoscopy    . Leg amputation below knee Left 11/09/2013    DR Erlinda Hong  . Amputation Left 11/09/2013    Procedure: LEFT AMPUTATION BELOW KNEE;  Surgeon: Marianna Payment, MD;  Location: [place];  Service: Orthopedics;  Laterality: Left;    Brynda Greathouse, MS RD LDN Clinical Inpatient Dietitian Pager: [phone] Weekend/After hours pager: [phone]

## 2013-11-24 NOTE — Progress Notes (Signed)
Occupational Therapy Session Note  Patient Details  Name: CHINMAY SQUIER MRN: [id number] Date of Birth: 11-04-1945  Today's Date: 11/24/2013 Time: 5681-2751 Time Calculation (min): 29 min  Skilled Therapeutic Interventions/Progress Updates:    Pt rolled down to the therapy gym in his wheelchair with supervision.  He performed UE ergonometer in order to increase UE strength and endurance with supervision.  He was able to perform 6 mins with level 10 resistance and RPMs kept at 22 and above.  After brief rest break he was able to perform 4 more minutes with intensity increased to level 15.  Also adapted pt's amputee pad for better fit before returning to the room.  Finished session by transferring stand pivot back to the bed with min assist using the RW.    Therapy Documentation Precautions:  Precautions Precautions: Fall Precaution Comments: L BKA, R toe wound, blackened heel on R Restrictions Weight Bearing Restrictions: No LLE Weight Bearing: Non weight bearing  Pain: Pain Assessment Pain Assessment: No/denies pain ADL: See FIM for current functional status  Therapy/Group: Individual Therapy  Therron Sells OTR/L 11/24/2013, 4:01 PM

## 2013-11-24 NOTE — Progress Notes (Signed)
Physical Therapy Session Note  Patient Details  Name: [name] [name] MRN: [id number] Date of Birth: 06-Apr-1946  Today's Date: 11/24/2013 Time: 2979-8921 Time Calculation (min): 58 min  Short Term Goals: Week 2:  PT Short Term Goal 1 (Week 2): =LTG's due to ELOS  Skilled Therapeutic Interventions/Progress Updates:   Pt received sitting in w/c in room, agreeable to therapy.  Pt self propelled to/from therapy gym using BUEs >150' at supervision level.  Transferred w/c to mat with RW at min/guard to close supervision level (min/guard to stand and supervision for transfer).  In supine, performed knee presses x 10 reps, SLR x 10 reps (BLEs), SL LLE abd x 10 reps, and SL LLE hip ext x 10 reps.  Transferred back into w/c as stated above and performed standing balance activity having brother toss beach ball to him while he hit ball back with either R or L UE to work on dynamic balance.  Pt requires min/guard to min assist to maintain balance.  Attempted to have pt catch ball with B UEs, however pt unable to balance long enough to take hands away.  Transitioned to standing mini squats on RLE with RW for UE support x 10 reps with cues for posture and technique.  Performed w/c push ups x 10 reps with cues for increased lat use.  Ended session with 37' gait training with RW at min/guard level.  Note that when pt sat down, he nearly slid out of chair and required mod assist to maintain seated position.  Pt self propelled back to room and was left in w/c with all needs in reach and family/friends present.  Also showed friends how to sign out pt for grounds pass.  RN aware.    Therapy Documentation Precautions:  Precautions Precautions: Fall Precaution Comments: L BKA, R toe wound, blackened heel on R Restrictions Weight Bearing Restrictions: Yes LLE Weight Bearing: Non weight bearing   Pain:     Locomotion : Ambulation Ambulation/Gait Assistance: 4: Min guard;5: Supervision Wheelchair  Mobility Distance: 150   See FIM for current functional status  Therapy/Group: Individual Therapy  Denice Bors 11/24/2013, 2:57 PM

## 2013-11-25 ENCOUNTER — Inpatient Hospital Stay (HOSPITAL_COMMUNITY): Payer: Medicare Other | Admitting: Occupational Therapy

## 2013-11-25 ENCOUNTER — Inpatient Hospital Stay (HOSPITAL_COMMUNITY): Payer: Medicare Other

## 2013-11-25 ENCOUNTER — Inpatient Hospital Stay (HOSPITAL_COMMUNITY): Payer: Medicare Other | Admitting: *Deleted

## 2013-11-25 ENCOUNTER — Encounter (HOSPITAL_COMMUNITY): Payer: Medicare Other | Admitting: Occupational Therapy

## 2013-11-25 LAB — GLUCOSE, CAPILLARY
GLUCOSE-CAPILLARY: 78 mg/dL (ref 70–99)
GLUCOSE-CAPILLARY: 86 mg/dL (ref 70–99)
GLUCOSE-CAPILLARY: 158 mg/dL — AB (ref 70–99)
Glucose-Capillary: 104 mg/dL — ABNORMAL HIGH (ref 70–99)
Glucose-Capillary: 92 mg/dL (ref 70–99)
Glucose-Capillary: 186 mg/dL — ABNORMAL HIGH (ref 70–99)

## 2013-11-25 MED ORDER — INSULIN DETEMIR 100 UNIT/ML ~~LOC~~ SOLN
30.0000 [IU] | Freq: Every day | SUBCUTANEOUS | Status: DC
  Administered 2013-11-25 – 2013-11-27 (×3): 30 [IU] via SUBCUTANEOUS
  Filled 2013-11-25 (×4): qty 0.3

## 2013-11-25 NOTE — Progress Notes (Signed)
Occupational Therapy Session Note  Patient Details  Name: [name] [name] MRN: [id number] Date of Birth: May 03, 1946  Today's Date: 11/25/2013 Time: 0902-1002 Time Calculation (min): 60 min  Short Term Goals: Week 2:  OT Short Term Goal 1 (Week 2): Pt will continue working toward [place] set at supervision.  Skilled Therapeutic Interventions/Progress Updates:    Pt performed bathing and dressing sit to stand at the sink this session.  He was able to perform all standing to wash his peri area and pull pants over hips with supervision.  He also completed grooming activities at a wheelchair level with modified independence.  Once finished had pt roll himself down to the therapy gym for further work on balance.  He worked on standing from the [facility] with use of the RW for support.  Mr. Difiore stood with min assist while alternating UEs to reach for rings placed in different positions.  He occasionally needed min assist to maintain standing balance while reaching.  Had him also ambulate with the RW and use of the reacher to pick up rings after he tossed them.  When releasing the UEs and attempting to pick up rings with the LUE he needed min assist.  Only min guard assist for using the RUE to pick up the rings.  Returned to room at end of session via wheelchair with supervision.    Therapy Documentation Precautions:  Precautions Precautions: Fall Precaution Comments: L BKA, R toe wound, blackened heel on R Restrictions Weight Bearing Restrictions: Yes LLE Weight Bearing: Non weight bearing  Pain: Pain Assessment Pain Assessment: Faces Faces Pain Scale: Hurts a little bit Pain Type: Surgical pain Pain Location: Leg Pain Orientation: Left ADL: See FIM for current functional status  Therapy/Group: Individual Therapy  Kelleigh Skerritt OTR/L 11/25/2013, 11:50 AM

## 2013-11-25 NOTE — Progress Notes (Signed)
Occupational Therapy Session Note  Patient Details  Name: [name] [name] MRN: [id number] Date of Birth: 1945-09-13  Today's Date: 11/25/2013 Time: 1030-1100 Time Calculation (min): 30 min  Short Term Goals: Week 2:  OT Short Term Goal 1 (Week 2): Pt will continue working toward [place] set at supervision.  Skilled Therapeutic Interventions/Progress Updates: Therapeutic activity with focus on safety awareness, improved independence with orthotics management (bil OTC wrist braces), and functional transfers w/braces on.   Patient received in his w/c, LLE residual limb elevated.   Patient was challenged to describe a physical deficit of concern but was unable to articulate possible challenges, s/p d/c.  Patient reports that his wife typically inspects his feet "when she can."  OT demonstrated use of inspection mirror (provided) and noted patient's orthotics (wrist braces).   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    OT challenged patient to don braces and attempt transfer to bed using RW.   Patient required repeated instruction on orthotics and required steadying assist for squat pivot transfer from w/c to bed with verbal cues for technique and problem solving.   Patient returned to w/c with steadying assist and tactile cue to initiate reach for armrest and to sit only after R-LE contact with w/c seat.    Therapy Documentation Precautions:  Precautions Precautions: Fall Precaution Comments: L BKA, R toe wound, blackened heel on R Restrictions Weight Bearing Restrictions: Yes LLE Weight Bearing: Non weight bearing   Pain: Pain Assessment Pain Assessment: Faces Faces Pain Scale: Hurts a little bit Pain Type: Surgical pain Pain Location: Leg Pain Orientation: Left  See FIM for current functional status  Therapy/Group: Individual Therapy  [place] 11/25/2013, 12:42 PM

## 2013-11-25 NOTE — Progress Notes (Signed)
68 y.o. right-handed male with history of diabetes mellitus or peripheral neuropathy, chronic systolic congestive heart failure, PAF with implantable cardioverter defibrillator. Patient lives with his wife uses a walker and wheelchair prior to admission. Admitted 11/09/2013 with left foot osteomyelitis and nonhealing ulcers. Limb was not felt to be salvageable. 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 Postoperative pain management. Postoperative anemia 8.0 and transfuse 11/11/2013. Close monitoring right foot wound with right toe ulceration and blackened heel. Bouts of confusion  Subjective/Complaints: No stump pain appreciate ortho note Review of Systems - otherwise neg  Objective: Vital Signs: Blood pressure 116/57, pulse 81, temperature 97.8 F (36.6 C), temperature source Oral, resp. rate 18, height 5\' 9"  (1.753 m), weight 76.9 kg (169 lb 8.5 oz), SpO2 99.00%. No results found. Results for orders placed during the hospital encounter of 11/14/13 (from the past 72 hour(s))  GLUCOSE, CAPILLARY     Status: Abnormal   Collection Time    11/22/13  7:31 AM      Result Value Ref Range   Glucose-Capillary 134 (*) 70 - 99 mg/dL   Comment 1 Notify RN    GLUCOSE, CAPILLARY     Status: Abnormal   Collection Time    11/22/13 11:42 AM      Result Value Ref Range   Glucose-Capillary 145 (*) 70 - 99 mg/dL  GLUCOSE, CAPILLARY     Status: Abnormal   Collection Time    11/22/13  4:47 PM      Result Value Ref Range   Glucose-Capillary 118 (*) 70 - 99 mg/dL  GLUCOSE, CAPILLARY     Status: Abnormal   Collection Time    11/22/13  8:32 PM      Result Value Ref Range   Glucose-Capillary 169 (*) 70 - 99 mg/dL   Comment 1 Notify RN    GLUCOSE, CAPILLARY     Status: Abnormal   Collection Time    11/23/13  7:27 AM      Result Value Ref Range   Glucose-Capillary 126 (*) 70 - 99 mg/dL   Comment 1 Notify RN    GLUCOSE, CAPILLARY     Status: None   Collection Time    11/23/13 11:59 AM       Result Value Ref Range   Glucose-Capillary 98  70 - 99 mg/dL   Comment 1 Notify RN    GLUCOSE, CAPILLARY     Status: Abnormal   Collection Time    11/23/13  4:44 PM      Result Value Ref Range   Glucose-Capillary 112 (*) 70 - 99 mg/dL   Comment 1 Notify RN    GLUCOSE, CAPILLARY     Status: Abnormal   Collection Time    11/23/13  8:30 PM      Result Value Ref Range   Glucose-Capillary 147 (*) 70 - 99 mg/dL   Comment 1 Notify RN    GLUCOSE, CAPILLARY     Status: Abnormal   Collection Time    11/24/13  7:15 AM      Result Value Ref Range   Glucose-Capillary 101 (*) 70 - 99 mg/dL   Comment 1 Notify RN    GLUCOSE, CAPILLARY     Status: Abnormal   Collection Time    11/24/13 12:05 PM      Result Value Ref Range   Glucose-Capillary 103 (*) 70 - 99 mg/dL  GLUCOSE, CAPILLARY     Status: Abnormal   Collection Time    11/24/13  4:24 PM      Result Value Ref Range   Glucose-Capillary 125 (*) 70 - 99 mg/dL   Comment 1 Notify RN    GLUCOSE, CAPILLARY     Status: Abnormal   Collection Time    11/24/13  8:37 PM      Result Value Ref Range   Glucose-Capillary 156 (*) 70 - 99 mg/dL     HEENT: poor dentition Cardio: irregular and no murmur Resp: CTA B/L and unlabored GI: BS positive and NT,ND Extremity:  Pulses positive and No Edema Skin:   Wound Left BK with Coban, , R lat great toe PIP area ulcer--some fibronecrotic debris still in area. Left heel with minimal, chronic breakdown  Erythema over sacrum no skin tear Neuro: Alert/Oriented, Cranial Nerve II-XII normal and Abnormal Motor UE 4/5 , RLE 4/5 except R ankle in PRAFO, Left HF 4/5, numbness both hand s in all fingers Musc/Skel:  Other Left BKA Gen NAD   Assessment/Plan: 1. Functional deficits secondary to Left BKA for osteomyelitis which require 3+ hours per day of interdisciplinary therapy in a comprehensive inpatient rehab setting. Physiatrist is providing close team supervision and 24 hour management of active medical  problems listed below. Physiatrist and rehab team continue to assess barriers to discharge/monitor patient progress toward functional and medical goals.  Target dc date 4/6   FIM: FIM - Bathing Bathing Steps Patient Completed: Chest;Right Arm;Left Arm;Abdomen;Right upper leg;Left upper leg;Front perineal area;Buttocks;Right lower leg (including foot) Bathing: 4: Steadying assist  FIM - Upper Body Dressing/Undressing Upper body dressing/undressing steps patient completed: Thread/unthread right sleeve of pullover shirt/dresss;Thread/unthread left sleeve of pullover shirt/dress;Put head through opening of pull over shirt/dress;Pull shirt over trunk Upper body dressing/undressing: 5: Set-up assist to: Obtain clothing/put away FIM - Lower Body Dressing/Undressing Lower body dressing/undressing steps patient completed: Thread/unthread right pants leg;Thread/unthread left pants leg;Pull pants up/down;Thread/unthread right underwear leg;Thread/unthread left underwear leg;Pull underwear up/down Lower body dressing/undressing: 4: Min-Patient completed 75 plus % of tasks  FIM - Toileting Toileting steps completed by patient: Adjust clothing after toileting;Adjust clothing prior to toileting Toileting Assistive Devices: Grab bar or rail for support Toileting: 4: Steadying assist  FIM - Radio producer Devices: Nurse, learning disability Transfers: 5-To toilet/BSC: Supervision (verbal cues/safety issues);5-From toilet/BSC: Supervision (verbal cues/safety issues)  FIM - Control and instrumentation engineer Devices: Copy: 4: Bed > Chair or W/C: Min A (steadying Pt. > 75%);4: Chair or W/C > Bed: Min A (steadying Pt. > 75%)  FIM - Locomotion: Wheelchair Distance: 150 Locomotion: Wheelchair: 5: Travels 150 ft or more: maneuvers on rugs and over door sills with supervision, cueing or coaxing FIM - Locomotion: Ambulation Locomotion:  Ambulation Assistive Devices: Administrator Ambulation/Gait Assistance: 4: Min guard;5: Supervision Locomotion: Ambulation: 1: Travels less than 50 ft with minimal assistance (Pt.>75%)  Comprehension Comprehension Mode: Auditory Comprehension: 5-Understands complex 90% of the time/Cues < 10% of the time  Expression Expression Mode: Verbal Expression: 5-Expresses basic 90% of the time/requires cueing < 10% of the time.  Social Interaction Social Interaction Mode: Asleep Social Interaction: 5-Interacts appropriately 90% of the time - Needs monitoring or encouragement for participation or interaction.  Problem Solving Problem Solving: 5-Solves basic 90% of the time/requires cueing < 10% of the time  Memory Memory: 6-More than reasonable amt of time  Medical Problem List and Plan:  1. 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 DVT Prophylaxis/Anticoagulation: SCDs right lower extremity  3. Pain Management: Lyrica 75 mg  twice a day, Oxycodone 10mg  and Robaxin as needed with reasonable control.  4. Neuropsych: This patient is capable of making decisions on his own behalf.Still confused , balance pain relief with  confusion 5. Acute blood loss anemia. Continue Niferex tablet daily. 6. Right foot wound. Right toe ulceration  Silvadene twice a day applied to the right toe wounds.   -adaptive shoe for right foot to help with weight bearing 7. Diabetes mellitus with peripheral neuropathy. Hemoglobin A1c 8.6. Glucophage 500 mg twice a day, Levemir 35 units each bedtime and NovoLog 3 units 3 times a day.  --low CBG reduce levimir 8. Chronic systolic congestive heart failure. Lasix 40 mg daily. Monitor for any signs of fluid overload  9. PAF with implantable cardioverter defibrillator. Cardiac rate control. Her chest pain or shortness of breath.  10. Hypertension. Coreg 12.5 mg twice a day.   11. Hyperlipidemia. Lipitor 12.  Grade 1 decubitus over sacrum/wound care: side lying and  OOB, sacral foam prophyllactic drsg continues  -right foot wounds stable, clean--continue current dressing  -left BK---healing well 13.  Finger numbness/stiffness at noc CTS vs progression of PN, trial wrist splints at hs LOS (Days) 11 A FACE TO FACE EVALUATION WAS PERFORMED  [name] [name] E 11/25/2013, 7:30 AM

## 2013-11-25 NOTE — Progress Notes (Signed)
Physical Therapy Session Note  Patient Details  Name: [name] [name] MRN: [id number] Date of Birth: 27-Jan-1946  Today's Date: 11/25/2013 Time: 13:02-13:58 (59mn)   Short Term Goals: Week 1:  PT Short Term Goal 1 (Week 1): Pt will perform bed mobility with HOB flat and without rails at supervision level in timely manner PT Short Term Goal 1 - Progress (Week 1): Met PT Short Term Goal 2 (Week 1): Pt will perform sit<> stand with RW at min assist level consistently PT Short Term Goal 2 - Progress (Week 1): Met PT Short Term Goal 3 (Week 1): Pt will perform stand pivot transfer with RW at min assist level PT Short Term Goal 3 - Progress (Week 1): Met PT Short Term Goal 4 (Week 1): Pt will perform dynamic standing balance with RW x 5 mins at min assist to increase activity tolerance to complete ADL's.  PT Short Term Goal 4 - Progress (Week 1): Met PT Short Term Goal 5 (Week 1): Pt will ambulate 15' with RW at min assist consistently while demonstrating increased safety Week 2:  PT Short Term Goal 1 (Week 2): =LTG's due to ELOS  Skilled Therapeutic Interventions/Progress Updates:    Tx focused on therex HEP, UE strengthening, transfers, and gait with RW.   [facility] with distance S x150'  Performed UE Ergometer x126m forwards/backwards for functional UE strengthening, with brief rest break.   Pt encouraged to perform WC set-up for transfer, needing verbal and visual cues for positioning and leg rest management.  Min A for WC<>mat transfers for steadying Sit<>supine with S cues for efficiency.  Provided HEP for BKA and instruction in sitting and supine. Strengthening focused on hip and knee ext for increased functional strength and pre-prosthetic training.   Gait training with RW x52' with close S and safe technique. Pt able to identify fatigue.  Discussed alignment, desensitization, and home safety during seated rests.   [facility]       Therapy Documentation Precautions:  Precautions Precautions: Fall Precaution Comments: L BKA, R toe wound, blackened heel on R Restrictions Weight Bearing Restrictions: Yes LLE Weight Bearing: Non weight bearing    Pain: 2/10 no intervention needed   See FIM for current functional status  Therapy/Group: Individual Therapy  CoKennieth RadPT, DPT  11/25/2013, 1:39 PM

## 2013-11-25 NOTE — Progress Notes (Signed)
Orthopedic Tech Progress Note Patient Details:  [name] [name] Aug 08, 1946 [id number]  Patient ID: [name] [name], male   DOB: Sep 15, 1945, 68 y.o.   MRN: [id number] Brace order completed by [facility], Kristalyn Bergstresser 11/25/2013, 4:16 PM

## 2013-11-25 NOTE — Progress Notes (Signed)
Social Work  Discharge Note  The overall goal for the admission was met for:   Discharge location: Yes-HOME WITH WIFE WHO CAN PROVIDE SUPERVISION LEVEL  Length of Stay: Yes-14 DAYS  Discharge activity level: Yes-SUPERVISION LEVEL  Home/community participation: Yes  Services provided included: MD, RD, PT, OT, SLP, RN, CM, TR, Pharmacy and SW  Financial Services: Private Insurance: [employer]  Follow-up services arranged: Home Health: [place] [facility]-PT,OT,RN, DME: [facility], BSC and Patient/Family has no preference for HH/DME agencies  Comments (or additional information):WIFE AWARE PT REQUIRES 24 HR SUPERVISION AND CAN PROVIDE FOR A SHORT TIME.  PT REQUESTED MOBILE MEALS REFERRAL BE MADE-PLACED ON WAITING LIST.  Patient/Family verbalized understanding of follow-up arrangements: Yes  Individual responsible for coordination of the follow-up plan: SHIRLEY-WIFE  Confirmed correct DME delivered: Elease Hashimoto 11/25/2013    Elease Hashimoto

## 2013-11-25 NOTE — Progress Notes (Signed)
Occupational Therapy Session Note  Patient Details  Name: DEMETRUS PAVAO MRN: [id number] Date of Birth: 1946-06-18   Today's Date: 11/25/2013 Time: 1414-1500 Time Calculation (min): 46 min  Short Term Goals: Week 2:  OT Short Term Goal 1 (Week 2): Pt will continue working toward [place] set at supervision.  Skilled Therapeutic Interventions/Progress Updates:    Pt worked on the ADL kitchen during session.  Had him use his wheelchair to perform a simple meal prep as pt liked to cook frequently at home.  He was able to roll himself around in the kitchen and retrieve all items he needed.  Mod instructional cueing for techniques of positioning wheelchair appropriately to stand at the counter when reaching up in the cabinet.  He was able to perform sit to stand with min guard assist as well.  Pt utilized stove safely and remembered to turn on and off the burner appropriately.  Also educated pt on moving items around with use of the counter tops as well if it is not safe to place in his lap or side of his wheelchair.    Therapy Documentation Precautions:  Precautions Precautions: Fall Precaution Comments: L BKA, R toe wound, blackened heel on R Restrictions Weight Bearing Restrictions: No LLE Weight Bearing: Non weight bearing  Pain: Pain Assessment Pain Assessment: Faces Faces Pain Scale: Hurts a little bit Pain Type: Surgical pain Pain Location: Leg Pain Orientation: Left Pain Intervention(s): Repositioned Multiple Pain Sites: No ADL: See FIM for current functional status  Therapy/Group: Individual Therapy  Dyllon Henken OTR/L 11/25/2013, 4:33 PM

## 2013-11-25 NOTE — Plan of Care (Signed)
Problem: RH SKIN INTEGRITY Goal: RH STG ABLE TO PERFORM INCISION/WOUND CARE W/ASSISTANCE STG Able To Perform Incision/Wound Care With max Assistance.  Outcome: Progressing shrinker ordered today from [facility]

## 2013-11-26 ENCOUNTER — Inpatient Hospital Stay (HOSPITAL_COMMUNITY): Payer: Medicare Other | Admitting: Rehabilitation

## 2013-11-26 LAB — GLUCOSE, CAPILLARY
GLUCOSE-CAPILLARY: 90 mg/dL (ref 70–99)
GLUCOSE-CAPILLARY: 85 mg/dL (ref 70–99)
Glucose-Capillary: 81 mg/dL (ref 70–99)
Glucose-Capillary: 131 mg/dL — ABNORMAL HIGH (ref 70–99)

## 2013-11-26 MED ORDER — OXYCODONE HCL 5 MG PO TABS
5.0000 mg | ORAL_TABLET | ORAL | Status: DC | PRN
  Administered 2013-11-27: 5 mg via ORAL
  Filled 2013-11-26: qty 1

## 2013-11-26 NOTE — Progress Notes (Signed)
Physical Therapy Session Note  Patient Details  Name: [name] [name] MRN: [id number] Date of Birth: 05-Jun-1946  Today's Date: 11/26/2013 Time: 5916-3846 Time Calculation (min): 44 min  Short Term Goals: Week 2:  PT Short Term Goal 1 (Week 2): =LTG's due to ELOS  Skilled Therapeutic Interventions/Progress Updates:   Pt received sitting in w/c in room, agreeable to therapy.  Focus of session was w/c mobility outdoors on uneven surfaces and also up/down inclines to work on strengthening, activity tolerance and simulated home entry.  Pt able to perform all w/c mobility in controlled and outdoors environment at Mod I level.  Also performed 40' gait training outdoors (even surface) with RW at supervision level.  He was also able to stand/sit at supervision level today!  Once back on rehab floor, returned to room and pt transferred back to EOB at supervision level.   Pt requested to sit at EOB and RN in room to make dressing change.  Note pt with shrinker donned, with pt stating that this was easier to use than ACE wrap.  Agree with pt and feel will have more compliance with wearing this.  Pt left at EOB with all needs in reach.   Therapy Documentation Precautions:  Precautions Precautions: Fall Precaution Comments: L BKA, R toe wound, blackened heel on R Restrictions Weight Bearing Restrictions: Yes LLE Weight Bearing: Non weight bearing   Vital Signs: Therapy Vitals Temp: 97.7 F (36.5 C) Temp src: Oral Pulse Rate: 83 Resp: 18 BP: 115/56 mmHg Patient Position, if appropriate: Lying Oxygen Therapy SpO2: 100 % O2 Device: None (Room air) Pain:no pain stated during session.    Locomotion : Ambulation Ambulation/Gait Assistance: 5: Supervision Wheelchair Mobility Distance: 300   See FIM for current functional status  Therapy/Group: Individual Therapy  Denice Bors 11/26/2013, 3:40 PM

## 2013-11-26 NOTE — Progress Notes (Signed)
68 y.o. right-handed male with history of diabetes mellitus or peripheral neuropathy, chronic systolic congestive heart failure, PAF with implantable cardioverter defibrillator. Patient lives with his wife uses a walker and wheelchair prior to admission. Admitted 11/09/2013 with left foot osteomyelitis and nonhealing ulcers. Limb was not felt to be salvageable. 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 Postoperative pain management. Postoperative anemia 8.0 and transfuse 11/11/2013. Close monitoring right foot wound with right toe ulceration and blackened heel. Bouts of confusion  Subjective/Complaints: Pt without new c/os no pain issues, has new stump shrinker Review of Systems - otherwise neg  Objective: Vital Signs: Blood pressure 109/56, pulse 83, temperature 98.2 F (36.8 C), temperature source Oral, resp. rate 18, height 5\' 9"  (1.753 m), weight 76.9 kg (169 lb 8.5 oz), SpO2 100.00%. No results found. Results for orders placed during the hospital encounter of 11/14/13 (from the past 72 hour(s))  GLUCOSE, CAPILLARY     Status: Abnormal   Collection Time    11/23/13  7:27 AM      Result Value Ref Range   Glucose-Capillary 126 (*) 70 - 99 mg/dL   Comment 1 Notify RN    GLUCOSE, CAPILLARY     Status: None   Collection Time    11/23/13 11:59 AM      Result Value Ref Range   Glucose-Capillary 98  70 - 99 mg/dL   Comment 1 Notify RN    GLUCOSE, CAPILLARY     Status: Abnormal   Collection Time    11/23/13  4:44 PM      Result Value Ref Range   Glucose-Capillary 112 (*) 70 - 99 mg/dL   Comment 1 Notify RN    GLUCOSE, CAPILLARY     Status: Abnormal   Collection Time    11/23/13  8:30 PM      Result Value Ref Range   Glucose-Capillary 147 (*) 70 - 99 mg/dL   Comment 1 Notify RN    GLUCOSE, CAPILLARY     Status: Abnormal   Collection Time    11/24/13  7:15 AM      Result Value Ref Range   Glucose-Capillary 101 (*) 70 - 99 mg/dL   Comment 1 Notify RN    GLUCOSE,  CAPILLARY     Status: Abnormal   Collection Time    11/24/13 12:05 PM      Result Value Ref Range   Glucose-Capillary 103 (*) 70 - 99 mg/dL  GLUCOSE, CAPILLARY     Status: Abnormal   Collection Time    11/24/13  4:24 PM      Result Value Ref Range   Glucose-Capillary 125 (*) 70 - 99 mg/dL   Comment 1 Notify RN    GLUCOSE, CAPILLARY     Status: Abnormal   Collection Time    11/24/13  8:37 PM      Result Value Ref Range   Glucose-Capillary 156 (*) 70 - 99 mg/dL  GLUCOSE, CAPILLARY     Status: None   Collection Time    11/25/13  7:25 AM      Result Value Ref Range   Glucose-Capillary 78  70 - 99 mg/dL   Comment 1 Notify RN    GLUCOSE, CAPILLARY     Status: None   Collection Time    11/25/13  7:43 AM      Result Value Ref Range   Glucose-Capillary 86  70 - 99 mg/dL   Comment 1 Notify RN    GLUCOSE, CAPILLARY  Status: Abnormal   Collection Time    11/25/13 11:31 AM      Result Value Ref Range   Glucose-Capillary 104 (*) 70 - 99 mg/dL   Comment 1 Notify RN    GLUCOSE, CAPILLARY     Status: None   Collection Time    11/25/13  4:57 PM      Result Value Ref Range   Glucose-Capillary 92  70 - 99 mg/dL   Comment 1 Notify RN    GLUCOSE, CAPILLARY     Status: Abnormal   Collection Time    11/25/13  8:53 PM      Result Value Ref Range   Glucose-Capillary 186 (*) 70 - 99 mg/dL   Comment 1 Notify RN    GLUCOSE, CAPILLARY     Status: Abnormal   Collection Time    11/25/13 10:05 PM      Result Value Ref Range   Glucose-Capillary 158 (*) 70 - 99 mg/dL   Comment 1 Notify RN       HEENT: poor dentition Cardio: irregular and no murmur Resp: CTA B/L and unlabored GI: BS positive and NT,ND Extremity:  Pulses positive and No Edema Skin:   Wound Left BK with Coban, , R lat great toe PIP area ulcer--some fibronecrotic debris still in area. Left heel with minimal, chronic breakdown  Erythema over sacrum no skin tear Neuro: Alert/Oriented, Cranial Nerve II-XII normal and Abnormal  Motor UE 4/5 , RLE 4/5 except R ankle in PRAFO, Left HF 4/5, numbness both hand s in all fingers Musc/Skel:  Other Left BKA Gen NAD   Assessment/Plan: 1. Functional deficits secondary to Left BKA for osteomyelitis which require 3+ hours per day of interdisciplinary therapy in a comprehensive inpatient rehab setting. Physiatrist is providing close team supervision and 24 hour management of active medical problems listed below. Physiatrist and rehab team continue to assess barriers to discharge/monitor patient progress toward functional and medical goals.  Target dc date 4/6   FIM: FIM - Bathing Bathing Steps Patient Completed: Chest;Right Arm;Left Arm;Abdomen;Right upper leg;Left upper leg;Front perineal area;Buttocks;Right lower leg (including foot) Bathing: 5: Supervision: Safety issues/verbal cues  FIM - Upper Body Dressing/Undressing Upper body dressing/undressing steps patient completed: Thread/unthread right sleeve of pullover shirt/dresss;Thread/unthread left sleeve of pullover shirt/dress;Put head through opening of pull over shirt/dress;Pull shirt over trunk Upper body dressing/undressing: 5: Set-up assist to: Obtain clothing/put away FIM - Lower Body Dressing/Undressing Lower body dressing/undressing steps patient completed: Thread/unthread right pants leg;Thread/unthread left pants leg;Pull pants up/down;Thread/unthread right underwear leg;Thread/unthread left underwear leg;Pull underwear up/down Lower body dressing/undressing: 5: Supervision: Safety issues/verbal cues  FIM - Toileting Toileting steps completed by patient: Adjust clothing after toileting;Adjust clothing prior to toileting Toileting Assistive Devices: Grab bar or rail for support Toileting: 4: Steadying assist  FIM - Radio producer Devices: Nurse, learning disability Transfers: 5-To toilet/BSC: Supervision (verbal cues/safety issues);5-From toilet/BSC: Supervision (verbal  cues/safety issues)  FIM - Control and instrumentation engineer Devices: Walker;Arm rests Bed/Chair Transfer: 5: Supine > Sit: Supervision (verbal cues/safety issues);5: Sit > Supine: Supervision (verbal cues/safety issues);4: Bed > Chair or W/C: Min A (steadying Pt. > 75%);4: Chair or W/C > Bed: Min A (steadying Pt. > 75%)  FIM - Locomotion: Wheelchair Distance: 150 Locomotion: Wheelchair: 5: Travels 150 ft or more: maneuvers on rugs and over door sills with supervision, cueing or coaxing FIM - Locomotion: Ambulation Locomotion: Ambulation Assistive Devices: Administrator Ambulation/Gait Assistance: 5: Supervision Locomotion: Ambulation: 2: Travels 50 -  149 ft with supervision/safety issues  Comprehension Comprehension Mode: Auditory Comprehension: 5-Understands complex 90% of the time/Cues < 10% of the time  Expression Expression Mode: Verbal Expression: 5-Expresses basic 90% of the time/requires cueing < 10% of the time.  Social Interaction Social Interaction Mode: Asleep Social Interaction: 5-Interacts appropriately 90% of the time - Needs monitoring or encouragement for participation or interaction.  Problem Solving Problem Solving: 5-Solves basic 90% of the time/requires cueing < 10% of the time  Memory Memory: 6-More than reasonable amt of time  Medical Problem List and Plan:  1. 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 DVT Prophylaxis/Anticoagulation: SCDs right lower extremity  3. Pain Management: Lyrica 75 mg twice a day, reduce Oxycodone 5mg  and Robaxin as needed with reasonable control.  4. Neuropsych: This patient is capable of making decisions on his own behalf.Still confused , balance pain relief with  confusion 5. Acute blood loss anemia. Continue Niferex tablet daily. 6. Right foot wound. Right toe ulceration  Silvadene twice a day applied to the right toe wounds.   -adaptive shoe for right foot to help with weight bearing 7. Diabetes  mellitus with peripheral neuropathy. Hemoglobin A1c 8.6. Glucophage 500 mg twice a day, Levemir 35 units each bedtime and NovoLog 3 units 3 times a day.  --low CBG reduce levimir 8. Chronic systolic congestive heart failure. Lasix 40 mg daily. Monitor for any signs of fluid overload  9. PAF with implantable cardioverter defibrillator. Cardiac rate control. Her chest pain or shortness of breath.  10. Hypertension. Coreg 12.5 mg twice a day.   11. Hyperlipidemia. Lipitor 12.  Grade 1 decubitus over sacrum/wound care: side lying and OOB, sacral foam prophyllactic drsg continues   13.  Finger numbness/stiffness at noc CTS vs progression of PN,  wrist splints at hs helping LOS (Days) 12 A FACE TO FACE EVALUATION WAS PERFORMED  KIRSTEINS,ANDREW E 11/26/2013, 7:19 AM

## 2013-11-27 ENCOUNTER — Inpatient Hospital Stay (HOSPITAL_COMMUNITY): Payer: Medicare Other | Admitting: *Deleted

## 2013-11-27 LAB — GLUCOSE, CAPILLARY
GLUCOSE-CAPILLARY: 180 mg/dL — AB (ref 70–99)
GLUCOSE-CAPILLARY: 113 mg/dL — AB (ref 70–99)
Glucose-Capillary: 101 mg/dL — ABNORMAL HIGH (ref 70–99)
Glucose-Capillary: 199 mg/dL — ABNORMAL HIGH (ref 70–99)

## 2013-11-27 NOTE — Discharge Summary (Signed)
NAMEMarland Kitchen  JAGER, KOSKA NO.:  [id number]  MEDICAL RECORD NO.:  [id number]  LOCATION:  4M01C                        FACILITY:  [facility]  PHYSICIAN:  [name], M.D.DATE OF BIRTH:  [birth year]  DATE OF ADMISSION:  11/14/2013 DATE OF DISCHARGE:  11/28/2013                              DISCHARGE SUMMARY   DISCHARGE DIAGNOSES: 1. 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 2. Sequential compression devices for deep venous thrombosis, right     lower extremity. 3. Pain management. 4. Acute blood loss anemia. 5. Right foot wound. 6. Diabetes mellitus, peripheral neuropathy. 7. Chronic systolic congestive heart failure. 8. Paroxysmal atrial fibrillation with implantable cardioverter     defibrillator. 9. Hypertension. 10.Hyperlipidemia.  HISTORY OF PRESENT ILLNESS:  This is a 68 year old right-handed male with history of diabetes mellitus, peripheral neuropathy, congestive heart failure, who lives with his wife.  Used a walker, wheelchair prior to admission.  Admitted on November 09, 2013 with left foot osteomyelitis and nonhealing ulcers.  Limb was not felt to be salvageable.  Underwent left transtibial amputation on November 09, 2013 per Dr. Erlinda Hong.  Postoperative pain management.  Postoperative anemia, 8.0 and transfused on November 11, 2013.  Close monitoring of right foot wound, right toe ulceration. Physical and occupational therapy ongoing, the patient was admitted for comprehensive rehab program.  PAST MEDICAL HISTORY:  See discharge diagnoses.  SOCIAL HISTORY:  Lives with spouse.  Functional history prior to admission; used a walker, wheelchair prior to admission.  Functional mobility upon admission to rehab services was min assist for transfers, short ambulation 8 feet, significant posterior lean.  PHYSICAL EXAMINATION:  VITAL SIGNS:  Blood pressure 124/60, pulse 99, temperature 96, respirations 18. GENERAL:  This was an alert male he was oriented  to person, place, and time in no acute distress.  Pupils round and reactive to light. LUNGS:  Clear to auscultation. CARDIAC:  Regular rate and rhythm. ABDOMEN:  Soft, nontender.  Good bowel sounds. EXTREMITIES:  Below-knee amputation site was dressed with immediate Coban postoperative dressing.  Right foot with toe ulceration at the tip of the big toe and between first and second toe with blackened heel.  REHABILITATION HOSPITAL COURSE:  The patient was admitted to inpatient rehab services with therapies initiated on a 3-hour daily basis consisting of physical therapy, occupational therapy, and rehabilitation nursing.  The following issues were addressed during the patient's rehabilitation stay.  Pertaining to Mr. Rasmusson left below-knee amputation on November 09, 2013; surgical site healing nicely, immediate Coban dressing had been removed with a 4 x 4, Kerlix and Ace wrap applied, no drainage noted.  He would follow up with orthopedics service, Dr. Erlinda Hong.  Sequential compression devices in place for DVT prophylaxis, right lower extremity as well as maintained on aspirin therapy.  Pain management with use of Lyrica as well as oxycodone. Acute blood loss anemia, stable, he had been transfused during his rehab course, he remained on iron supplement.  He did have a history of diabetes mellitus and peripheral neuropathy, hemoglobin A1c of 8.6, he remained on Glucophage as well as insulin as directed with diabetic teaching completed.  Chronic systolic congestive heart failure, maintained on Lasix with no signs of fluid overload.  If blood pressures remained well controlled with no orthostatic changes, he would follow up with his primary MD.  The patient received weekly collaborative interdisciplinary team conferences to discuss estimated length of stay, family teaching, and any barriers to discharge.  He was ambulating 40 feet with a rolling walker at supervision level.  He was able to  stand and sit a supervision level.  He is able to perform simple activities of daily living from his wheelchair.  Other than needing some moderate instructional cuing for techniques of positioning his wheelchair.  Full family teaching was completed and plan was to be discharged to home.  DISCHARGE MEDICATIONS: 1. Aspirin 325 mg p.o. daily. 2. Lipitor 10 mg p.o. daily. 3. Coreg 12.5 mg p.o. b.i.d. 4. Plavix 75 mg p.o. daily. 5. Lasix 40 mg p.o. daily. 6. Levemir 30 units at bedtime. 7. Niferex 150 mg p.o. daily. 8. Glucophage 500 mg p.o. b.i.d. 9. Robaxin 500 mg p.o. every 6 hours as needed for muscle spasms. 10.Multivitamin 1 tablet daily. 11.Oxycodone immediate release 5 mg 1 tablet every 3 hours as needed     for pain. 12.Protonix 80 mg p.o. daily. 13.Potassium chloride 20 mEq p.o. b.i.d. 14.Lyrica 75 mg p.o. b.i.d.  DIET:  Diabetic diet.  SPECIAL INSTRUCTIONS:  The patient to apply Silvadene to right toe wounds daily, keep wound dry.  The patient to follow up with Dr. Alysia Penna at the outpatient rehab center on Jan 02, 2014.  Dr Erlinda Hong, two weeks call for appointment.  Dr. Josetta Huddle on December 12, 2013, medical management.     Lauraine Rinne, P.A.   ______________________________ [name], M.D.   DA/MEDQ  D:  11/27/2013  T:  11/27/2013  Job:  [id number]  cc:   [name], M.D. Dr. Eduard Roux

## 2013-11-27 NOTE — Discharge Summary (Signed)
  Discharge summary job # [id number]

## 2013-11-27 NOTE — Progress Notes (Signed)
Occupational Therapy Note  Patient Details  Name: [name] [name] MRN: [id number] Date of Birth: 12-14-1945 Today's Date: 11/27/2013  Time: 1400-1455 Pt denied pain Individual Therapy  Pt engaged in [employer] in therapy gym.  Pt required to locate eggs while seated in w/c and then amb with RW to retrieve eggs and place in basket.  Pt transitioned to activity requiring patient to toss and catch ball with letters and name related Easter words using letter on ball.  Activities addressed activity tolerance, dynamic standing balance, functional amb with RW, selective attention, and safety awareness.  Pt required close supervision during standing tasks and ambulation.    Leotis Shames [facility] 11/27/2013, 3:38 PM

## 2013-11-27 NOTE — Progress Notes (Signed)
68 y.o. right-handed male with history of diabetes mellitus or peripheral neuropathy, chronic systolic congestive heart failure, PAF with implantable cardioverter defibrillator. Patient lives with his wife uses a walker and wheelchair prior to admission. Admitted 11/09/2013 with left foot osteomyelitis and nonhealing ulcers. Limb was not felt to be salvageable. 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 Postoperative pain management. Postoperative anemia 8.0 and transfuse 11/11/2013. Close monitoring right foot wound with right toe ulceration and blackened heel. Bouts of confusion  Subjective/Complaints: No issues overnite, discussed diet choices Review of Systems - otherwise neg  Objective: Vital Signs: Blood pressure 120/62, pulse 92, temperature 98.1 F (36.7 C), temperature source Oral, resp. rate 18, height 5\' 9"  (1.753 m), weight 76.9 kg (169 lb 8.5 oz), SpO2 100.00%. No results found. Results for orders placed during the hospital encounter of 11/14/13 (from the past 72 hour(s))  GLUCOSE, CAPILLARY     Status: Abnormal   Collection Time    11/24/13  7:15 AM      Result Value Ref Range   Glucose-Capillary 101 (*) 70 - 99 mg/dL   Comment 1 Notify RN    GLUCOSE, CAPILLARY     Status: Abnormal   Collection Time    11/24/13 12:05 PM      Result Value Ref Range   Glucose-Capillary 103 (*) 70 - 99 mg/dL  GLUCOSE, CAPILLARY     Status: Abnormal   Collection Time    11/24/13  4:24 PM      Result Value Ref Range   Glucose-Capillary 125 (*) 70 - 99 mg/dL   Comment 1 Notify RN    GLUCOSE, CAPILLARY     Status: Abnormal   Collection Time    11/24/13  8:37 PM      Result Value Ref Range   Glucose-Capillary 156 (*) 70 - 99 mg/dL  GLUCOSE, CAPILLARY     Status: None   Collection Time    11/25/13  7:25 AM      Result Value Ref Range   Glucose-Capillary 78  70 - 99 mg/dL   Comment 1 Notify RN    GLUCOSE, CAPILLARY     Status: None   Collection Time    11/25/13  7:43 AM       Result Value Ref Range   Glucose-Capillary 86  70 - 99 mg/dL   Comment 1 Notify RN    GLUCOSE, CAPILLARY     Status: Abnormal   Collection Time    11/25/13 11:31 AM      Result Value Ref Range   Glucose-Capillary 104 (*) 70 - 99 mg/dL   Comment 1 Notify RN    GLUCOSE, CAPILLARY     Status: None   Collection Time    11/25/13  4:57 PM      Result Value Ref Range   Glucose-Capillary 92  70 - 99 mg/dL   Comment 1 Notify RN    GLUCOSE, CAPILLARY     Status: Abnormal   Collection Time    11/25/13  8:53 PM      Result Value Ref Range   Glucose-Capillary 186 (*) 70 - 99 mg/dL   Comment 1 Notify RN    GLUCOSE, CAPILLARY     Status: Abnormal   Collection Time    11/25/13 10:05 PM      Result Value Ref Range   Glucose-Capillary 158 (*) 70 - 99 mg/dL   Comment 1 Notify RN    GLUCOSE, CAPILLARY     Status: None  Collection Time    11/26/13  7:16 AM      Result Value Ref Range   Glucose-Capillary 81  70 - 99 mg/dL   Comment 1 Notify RN    GLUCOSE, CAPILLARY     Status: None   Collection Time    11/26/13 11:15 AM      Result Value Ref Range   Glucose-Capillary 90  70 - 99 mg/dL   Comment 1 Notify RN    GLUCOSE, CAPILLARY     Status: Abnormal   Collection Time    11/26/13  4:30 PM      Result Value Ref Range   Glucose-Capillary 131 (*) 70 - 99 mg/dL   Comment 1 Notify RN    GLUCOSE, CAPILLARY     Status: None   Collection Time    11/26/13  8:39 PM      Result Value Ref Range   Glucose-Capillary 85  70 - 99 mg/dL     HEENT: poor dentition Cardio: irregular and no murmur Resp: CTA B/L and unlabored GI: BS positive and NT,ND Extremity:  Pulses positive and No Edema Skin:   Wound Left BK with Coban, , R lat great toe PIP area ulcer--some fibronecrotic debris still in area. Left heel with minimal, chronic breakdown  Erythema over sacrum no skin tear Neuro: Alert/Oriented, Cranial Nerve II-XII normal and Abnormal Motor UE 4/5 , RLE 4/5 except R ankle in PRAFO, Left HF 4/5,  numbness both hand s in all fingers Musc/Skel:  Other Left BKA Gen NAD   Assessment/Plan: 1. Functional deficits secondary to Left BKA for osteomyelitis which require 3+ hours per day of interdisciplinary therapy in a comprehensive inpatient rehab setting. Physiatrist is providing close team supervision and 24 hour management of active medical problems listed below. Physiatrist and rehab team continue to assess barriers to discharge/monitor patient progress toward functional and medical goals.  Target dc date 4/6   FIM: FIM - Bathing Bathing Steps Patient Completed: Chest;Right Arm;Left Arm;Abdomen;Right upper leg;Left upper leg;Front perineal area;Buttocks;Right lower leg (including foot) Bathing: 5: Supervision: Safety issues/verbal cues  FIM - Upper Body Dressing/Undressing Upper body dressing/undressing steps patient completed: Thread/unthread right sleeve of pullover shirt/dresss;Thread/unthread left sleeve of pullover shirt/dress;Put head through opening of pull over shirt/dress;Pull shirt over trunk Upper body dressing/undressing: 5: Set-up assist to: Obtain clothing/put away FIM - Lower Body Dressing/Undressing Lower body dressing/undressing steps patient completed: Thread/unthread right pants leg;Thread/unthread left pants leg;Pull pants up/down;Thread/unthread right underwear leg;Thread/unthread left underwear leg;Pull underwear up/down Lower body dressing/undressing: 5: Supervision: Safety issues/verbal cues  FIM - Toileting Toileting steps completed by patient: Adjust clothing after toileting;Adjust clothing prior to toileting Toileting Assistive Devices: Grab bar or rail for support Toileting: 4: Steadying assist  FIM - Radio producer Devices: Nurse, learning disability Transfers: 5-To toilet/BSC: Supervision (verbal cues/safety issues);5-From toilet/BSC: Supervision (verbal cues/safety issues)  FIM - Ship broker Devices: Walker;Arm rests Bed/Chair Transfer: 5: Bed > Chair or W/C: Supervision (verbal cues/safety issues);5: Chair or W/C > Bed: Supervision (verbal cues/safety issues)  FIM - Locomotion: Wheelchair Distance: 300 Locomotion: Wheelchair: 6: Travels 150 ft or more, turns around, maneuvers to table, bed or toilet, negotiates 3% grade: maneuvers on rugs and over door sills independently FIM - Locomotion: Ambulation Locomotion: Ambulation Assistive Devices: Administrator Ambulation/Gait Assistance: 5: Supervision Locomotion: Ambulation: 1: Travels less than 50 ft with supervision/safety issues  Comprehension Comprehension Mode: Auditory Comprehension: 5-Understands complex 90% of the time/Cues < 10% of the  time  Expression Expression Mode: Verbal Expression: 5-Expresses basic 90% of the time/requires cueing < 10% of the time.  Social Interaction Social Interaction Mode: Asleep Social Interaction: 5-Interacts appropriately 90% of the time - Needs monitoring or encouragement for participation or interaction.  Problem Solving Problem Solving: 5-Solves basic 90% of the time/requires cueing < 10% of the time  Memory Memory: 6-More than reasonable amt of time  Medical Problem List and Plan:  1. 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 DVT Prophylaxis/Anticoagulation: SCDs right lower extremity  3. Pain Management: Lyrica 75 mg twice a day, reduce Oxycodone 5mg  and Robaxin as needed with reasonable control.  4. Neuropsych: This patient is capable of making decisions on his own behalf.Still confused , balance pain relief with  confusion 5. Acute blood loss anemia. Continue Niferex tablet daily. 6. Right foot wound. Right toe ulceration  Silvadene twice a day applied to the right toe wounds.   -adaptive shoe for right foot to help with weight bearing 7. Diabetes mellitus with peripheral neuropathy. Hemoglobin A1c 8.6. Glucophage 500 mg twice a day, Levemir 35  units each bedtime and NovoLog 3 units 3 times a day.  --low CBG reduce levimir 8. Chronic systolic congestive heart failure. Lasix 40 mg daily. Monitor for any signs of fluid overload  9. PAF with implantable cardioverter defibrillator. Cardiac rate control. Her chest pain or shortness of breath.  10. Hypertension. Coreg 12.5 mg twice a day.   11. Hyperlipidemia. Lipitor 12.  Grade 1 decubitus over sacrum/wound care: side lying and OOB, sacral foam prophyllactic drsg continues   13.  Finger numbness/stiffness at noc CTS vs progression of PN,  wrist splints at hs helping LOS (Days) 13 A FACE TO FACE EVALUATION WAS PERFORMED  KIRSTEINS,ANDREW E 11/27/2013, 6:42 AM

## 2013-11-28 ENCOUNTER — Inpatient Hospital Stay (HOSPITAL_COMMUNITY): Payer: Medicare Other | Admitting: Rehabilitation

## 2013-11-28 ENCOUNTER — Inpatient Hospital Stay (HOSPITAL_COMMUNITY): Payer: Medicare Other | Admitting: Occupational Therapy

## 2013-11-28 ENCOUNTER — Encounter (HOSPITAL_COMMUNITY): Payer: Medicare Other | Admitting: Occupational Therapy

## 2013-11-28 DIAGNOSIS — S88119A Complete traumatic amputation at level between knee and ankle, unspecified lower leg, initial encounter: Secondary | ICD-10-CM

## 2013-11-28 DIAGNOSIS — I739 Peripheral vascular disease, unspecified: Secondary | ICD-10-CM

## 2013-11-28 DIAGNOSIS — L98499 Non-pressure chronic ulcer of skin of other sites with unspecified severity: Secondary | ICD-10-CM

## 2013-11-28 LAB — GLUCOSE, CAPILLARY
GLUCOSE-CAPILLARY: 93 mg/dL (ref 70–99)
Glucose-Capillary: 84 mg/dL (ref 70–99)

## 2013-11-28 MED ORDER — PREGABALIN 75 MG PO CAPS: 75.0000 mg | ORAL_CAPSULE | Freq: Two times a day (BID) | ORAL | Status: DC

## 2013-11-28 MED ORDER — INSULIN DETEMIR 100 UNIT/ML ~~LOC~~ SOLN: 35.0000 [IU] | Freq: Every day | SUBCUTANEOUS | Status: DC

## 2013-11-28 MED ORDER — ASPIRIN 325 MG PO TBEC: 325.0000 mg | DELAYED_RELEASE_TABLET | Freq: Every day | ORAL | Status: DC

## 2013-11-28 MED ORDER — POLYSACCHARIDE IRON COMPLEX 150 MG PO CAPS: 150.0000 mg | ORAL_CAPSULE | Freq: Every day | ORAL | Status: DC

## 2013-11-28 MED ORDER — SENNOSIDES-DOCUSATE SODIUM 8.6-50 MG PO TABS: 1.0000 | ORAL_TABLET | Freq: Two times a day (BID) | ORAL | Status: DC

## 2013-11-28 MED ORDER — METFORMIN HCL 500 MG PO TABS: 500.0000 mg | ORAL_TABLET | Freq: Two times a day (BID) | ORAL | Status: DC

## 2013-11-28 MED ORDER — POTASSIUM CHLORIDE CRYS ER 20 MEQ PO TBCR: 20.0000 meq | EXTENDED_RELEASE_TABLET | Freq: Two times a day (BID) | ORAL | Status: DC

## 2013-11-28 NOTE — Progress Notes (Signed)
68 y.o. right-handed male with history of diabetes mellitus or peripheral neuropathy, chronic systolic congestive heart failure, PAF with implantable cardioverter defibrillator. Patient lives with his wife uses a walker and wheelchair prior to admission. Admitted 11/09/2013 with left foot osteomyelitis and nonhealing ulcers. Limb was not felt to be salvageable. 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 Postoperative pain management. Postoperative anemia 8.0 and transfuse 11/11/2013. Close monitoring right foot wound with right toe ulceration and blackened heel. Bouts of confusion  Subjective/Complaints: No issues overnite, discussed diet choices Review of Systems - otherwise neg  Objective: Vital Signs: Blood pressure 109/68, pulse 83, temperature 98.6 F (37 C), temperature source Oral, resp. rate 17, height 5\' 9"  (1.753 m), weight 76.9 kg (169 lb 8.5 oz), SpO2 100.00%. No results found. Results for orders placed during the hospital encounter of 11/14/13 (from the past 72 hour(s))  GLUCOSE, CAPILLARY     Status: None   Collection Time    11/25/13  7:25 AM      Result Value Ref Range   Glucose-Capillary 78  70 - 99 mg/dL   Comment 1 Notify RN    GLUCOSE, CAPILLARY     Status: None   Collection Time    11/25/13  7:43 AM      Result Value Ref Range   Glucose-Capillary 86  70 - 99 mg/dL   Comment 1 Notify RN    GLUCOSE, CAPILLARY     Status: Abnormal   Collection Time    11/25/13 11:31 AM      Result Value Ref Range   Glucose-Capillary 104 (*) 70 - 99 mg/dL   Comment 1 Notify RN    GLUCOSE, CAPILLARY     Status: None   Collection Time    11/25/13  4:57 PM      Result Value Ref Range   Glucose-Capillary 92  70 - 99 mg/dL   Comment 1 Notify RN    GLUCOSE, CAPILLARY     Status: Abnormal   Collection Time    11/25/13  8:53 PM      Result Value Ref Range   Glucose-Capillary 186 (*) 70 - 99 mg/dL   Comment 1 Notify RN    GLUCOSE, CAPILLARY     Status: Abnormal   Collection Time    11/25/13 10:05 PM      Result Value Ref Range   Glucose-Capillary 158 (*) 70 - 99 mg/dL   Comment 1 Notify RN    GLUCOSE, CAPILLARY     Status: None   Collection Time    11/26/13  7:16 AM      Result Value Ref Range   Glucose-Capillary 81  70 - 99 mg/dL   Comment 1 Notify RN    GLUCOSE, CAPILLARY     Status: None   Collection Time    11/26/13 11:15 AM      Result Value Ref Range   Glucose-Capillary 90  70 - 99 mg/dL   Comment 1 Notify RN    GLUCOSE, CAPILLARY     Status: Abnormal   Collection Time    11/26/13  4:30 PM      Result Value Ref Range   Glucose-Capillary 131 (*) 70 - 99 mg/dL   Comment 1 Notify RN    GLUCOSE, CAPILLARY     Status: None   Collection Time    11/26/13  8:39 PM      Result Value Ref Range   Glucose-Capillary 85  70 - 99 mg/dL  GLUCOSE, CAPILLARY  Status: Abnormal   Collection Time    11/27/13  7:10 AM      Result Value Ref Range   Glucose-Capillary 101 (*) 70 - 99 mg/dL   Comment 1 Notify RN    GLUCOSE, CAPILLARY     Status: Abnormal   Collection Time    11/27/13 11:42 AM      Result Value Ref Range   Glucose-Capillary 180 (*) 70 - 99 mg/dL  GLUCOSE, CAPILLARY     Status: Abnormal   Collection Time    11/27/13  4:49 PM      Result Value Ref Range   Glucose-Capillary 113 (*) 70 - 99 mg/dL  GLUCOSE, CAPILLARY     Status: Abnormal   Collection Time    11/27/13  9:02 PM      Result Value Ref Range   Glucose-Capillary 199 (*) 70 - 99 mg/dL     HEENT: poor dentition Cardio: irregular and no murmur Resp: CTA B/L and unlabored GI: BS positive and NT,ND Extremity:  Pulses positive and No Edema Skin:   Wound Left BK with Coban, , R lat great toe PIP area ulcer--some fibronecrotic debris still in area. Left heel with minimal, chronic breakdown  Erythema over sacrum no skin tear Neuro: Alert/Oriented, Cranial Nerve II-XII normal and Abnormal Motor UE 4/5 , RLE 4/5 except R ankle in PRAFO, Left HF 4/5, numbness both hand s in  all fingers Musc/Skel:  Other Left BKA Gen NAD   Assessment/Plan: 1. Functional deficits secondary to Left BKA for osteomyelitis Stable for D/C today F/u PCP in 1-2 weeks F/u ortho 4/9 F/u PM&R 3 weeks, will need NCV to eval CTS See D/C summary See D/C instructions  FIM: FIM - Bathing Bathing Steps Patient Completed: Chest;Right Arm;Left Arm;Abdomen;Right upper leg;Left upper leg;Front perineal area;Buttocks;Right lower leg (including foot) Bathing: 5: Supervision: Safety issues/verbal cues  FIM - Upper Body Dressing/Undressing Upper body dressing/undressing steps patient completed: Thread/unthread right sleeve of pullover shirt/dresss;Thread/unthread left sleeve of pullover shirt/dress;Put head through opening of pull over shirt/dress;Pull shirt over trunk Upper body dressing/undressing: 5: Set-up assist to: Obtain clothing/put away FIM - Lower Body Dressing/Undressing Lower body dressing/undressing steps patient completed: Thread/unthread right pants leg;Thread/unthread left pants leg;Pull pants up/down;Thread/unthread right underwear leg;Thread/unthread left underwear leg;Pull underwear up/down Lower body dressing/undressing: 5: Supervision: Safety issues/verbal cues  FIM - Toileting Toileting steps completed by patient: Adjust clothing after toileting;Adjust clothing prior to toileting Toileting Assistive Devices: Grab bar or rail for support Toileting: 4: Steadying assist  FIM - Radio producer Devices: Nurse, learning disability Transfers: 5-To toilet/BSC: Supervision (verbal cues/safety issues);5-From toilet/BSC: Supervision (verbal cues/safety issues)  FIM - Control and instrumentation engineer Devices: Walker;Arm rests Bed/Chair Transfer: 5: Bed > Chair or W/C: Supervision (verbal cues/safety issues);5: Chair or W/C > Bed: Supervision (verbal cues/safety issues)  FIM - Locomotion: Wheelchair Distance: 300 Locomotion:  Wheelchair: 6: Travels 150 ft or more, turns around, maneuvers to table, bed or toilet, negotiates 3% grade: maneuvers on rugs and over door sills independently FIM - Locomotion: Ambulation Locomotion: Ambulation Assistive Devices: Administrator Ambulation/Gait Assistance: 5: Supervision Locomotion: Ambulation: 1: Travels less than 50 ft with supervision/safety issues  Comprehension Comprehension Mode: Auditory Comprehension: 5-Understands complex 90% of the time/Cues < 10% of the time  Expression Expression Mode: Verbal Expression: 5-Expresses basic 90% of the time/requires cueing < 10% of the time.  Social Interaction Social Interaction Mode: Asleep Social Interaction: 6-Interacts appropriately with others with medication or extra time (anti-anxiety,  antidepressant).  Problem Solving Problem Solving: 5-Solves basic 90% of the time/requires cueing < 10% of the time  Memory Memory: 6-More than reasonable amt of time  Medical Problem List and Plan:  1. 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 DVT Prophylaxis/Anticoagulation: SCDs right lower extremity  3. Pain Management: Lyrica 75 mg twice a day, reduce Oxycodone 5mg  and Robaxin as needed with reasonable control.  4. Neuropsych: This patient is capable of making decisions on his own behalf.Still confused , balance pain relief with  confusion 5. Acute blood loss anemia. Continue Niferex tablet daily. 6. Right foot wound. Right toe ulceration  Silvadene twice a day applied to the right toe wounds. F/u ortho on 4/9  -adaptive shoe for right foot to help with weight bearing 7. Diabetes mellitus with peripheral neuropathy. Hemoglobin A1c 8.6. Glucophage 500 mg twice a day, Levemir 35 units each bedtime and NovoLog 3 units 3 times a day.  --low CBG reduce levimir 8. Chronic systolic congestive heart failure. Lasix 40 mg daily. Monitor for any signs of fluid overload  9. PAF with implantable cardioverter defibrillator. Cardiac  rate control. Her chest pain or shortness of breath.  10. Hypertension. Coreg 12.5 mg twice a day.   11. Hyperlipidemia. Lipitor 12.  Grade 1 decubitus over sacrum/wound care: side lying and OOB, sacral foam prophyllactic drsg continues   13.  Finger numbness/stiffness at noc CTS vs progression of PN,  wrist splints at hs helping LOS (Days) 14 A FACE TO FACE EVALUATION WAS PERFORMED  KIRSTEINS,ANDREW E 11/28/2013, 6:21 AM

## 2013-11-28 NOTE — Progress Notes (Signed)
RN demonstrated and explained wound care with pt. And his wife.Questions were answer.Pt. Ready to be discharge home

## 2013-11-28 NOTE — Discharge Instructions (Signed)
Inpatient Rehab Discharge Instructions  [name] [name] Discharge date and time: 11/28/13   Activities/Precautions/ Functional Status: Activity: activity as tolerated Diet: diabetic diet Wound Care: keep wound clean and dry Functional status:  ___ No restrictions     ___ Walk up steps independently ___ 24/7 supervision/assistance   ___ Walk up steps with assistance _X__ Intermittent supervision/assistance  ___ Bathe/dress independently ___ Walk with walker     _X__ Bathe/dress with assistance ___ Walk Independently    ___ Shower independently _X__ Walk with assistance    ___ Shower with assistance _x__ No alcohol     ___ Return to work/school ________  Special Instructions: 1. Check blood sugars twice a day and record.   COMMUNITY REFERRALS UPON DISCHARGE:    Home Health:   PT,OT,RN  [place] [facility]:[phone] Date of last service:11/28/2013  Medical Equipment/Items [place]    [phone]   GENERAL COMMUNITY RESOURCES FOR PATIENT/FAMILY: Support [place]   My questions have been answered and I understand these instructions. I will adhere to these goals and the provided educational materials after my discharge from the hospital.  Patient/Caregiver Signature _______________________________ Date __________  Clinician Signature _______________________________________ Date __________  Please bring this form and your medication list with you to all your follow-up doctor's appointments.

## 2013-11-28 NOTE — Progress Notes (Addendum)
Physical Therapy Discharge Summary  Patient Details  Name: [name] [name] MRN: [id number] Date of Birth: 09-17-1945  Today's Date: 11/28/2013 Time: 0160-1093 Time Calculation (min): 62 min  Patient has met 10 of 10 long term goals due to improved activity tolerance, improved balance, improved postural control, increased strength, decreased pain, ability to compensate for deficits, improved attention, improved awareness and improved coordination.  Patient to discharge at a wheelchair level Modified Independent for long distances and supervision with RW for short distances.   Patient's care partner is independent to provide the necessary physical and cognitive assistance at discharge.  Reasons goals not met: n/a  Recommendation:  Patient will benefit from ongoing skilled PT services in home health setting to continue to advance safe functional mobility, address ongoing impairments in decreased balance, decreased overall strength, decreased use of L limb, gait abnormality, decreased endurance, and minimize fall risk.  Equipment: RW and limb pad for pts personal w/c  Reasons for discharge: treatment goals met and discharge from hospital  Patient/family agrees with progress made and goals achieved: Yes  PT treatment/Intervention:  Pt received sitting in w/c in room.  Note wife was not there initially, however pt called her to come from another part of hospital.  Pt self propelled >150' in home and controlled environment, as well as up/down ramp at Mod I level (supervision for ramp in therapy gym due to small lip sometimes prevents back wheels from getting onto ramp, therefore supervision for safety).  Performed car transfer x 2 reps (once with PT and once with wife).  Provided cues on where wife needs to be to assist if he were to have LOB, that she would remain close to him with hands up behind him for safety.  Performed car transfer at supervision level.  Had pt propel to ADL apt and perform  gait and bed mobility to better simulate home.  Pt able to perform with wife at supervision to min/guard level (min/guard from bed as is lower surface).  Then propelled to therapy gym and performed ramp at supervision to Mod I level.  Then performed 81' gait with RW at supervision level.  Again had wife walk with him and provided cues for where to stand and how to assist if pt were to have LOB.  Both verbalized and return demonstration.  Pt propelled remainder of distance to room and transferred to bed at supervision level.  Once in bed, went over supine therex again and provided hand out for increased carryover.  See handout for details.  Pt left in bed with all needs in reach and wife present.    PT Discharge Precautions/Restrictions Precautions Precautions: Fall Precaution Comments: L BKA, R toe wound, blackened heel on R Restrictions Weight Bearing Restrictions: Yes LLE Weight Bearing: Non weight bearing   Pain Pain Assessment Pain Assessment: No/denies pain Pain Score: 0-No pain    Cognition Overall Cognitive Status: Within Functional Limits for tasks assessed Arousal/Alertness: Awake/alert Orientation Level: Oriented X4 Attention: Selective;Alternating Sustained Attention: Appears intact Selective Attention: Appears intact Alternating Attention: Appears intact Awareness: Appears intact Safety/Judgment: Appears intact Comments: Pt needing slight verbal reminders for foot placement during transfers, however overall is doing much better, demonstrating safer transfers and demonstrates good anticipatory awareness in having PT bring chair behind him during gait.  Sensation Sensation Light Touch: Appears Intact Stereognosis: Not tested Hot/Cold: Not tested Proprioception: Appears Intact Coordination Gross Motor Movements are Fluid and Coordinated: Yes Fine Motor Movements are Fluid and Coordinated: Yes Motor  Motor Motor -  Discharge Observations: Pt continues to demonstrate  posterior pelvic tilt in sitting, however overall posture has improved.  He also continues to demonstrate decreased balance when standing with RW  Mobility Bed Mobility Bed Mobility: Supine to Sit;Sit to Supine Supine to Sit: 6: Modified independent (Device/Increase time) Sit to Supine: 6: Modified independent (Device/Increase time) Transfers Transfers: Yes Sit to Stand: 5: Supervision Sit to Stand Details: Verbal cues for sequencing;Verbal cues for technique;Verbal cues for precautions/safety Stand to Sit: 5: Supervision Stand to Sit Details (indicate cue type and reason): Verbal cues for sequencing;Verbal cues for technique;Verbal cues for precautions/safety Stand Pivot Transfers: 5: Supervision Stand Pivot Transfer Details: Verbal cues for sequencing;Verbal cues for technique;Verbal cues for precautions/safety;Verbal cues for safe use of DME/AE Locomotion  Ambulation Ambulation: Yes Ambulation/Gait Assistance: 5: Supervision Ambulation Distance (Feet): 55 Feet Assistive device: Rolling walker Ambulation/Gait Assistance Details: Verbal cues for sequencing;Verbal cues for technique;Verbal cues for precautions/safety;Verbal cues for safe use of DME/AE;Verbal cues for gait pattern Gait Gait: Yes Gait Pattern: Impaired Gait Pattern: Step-to pattern;Trunk flexed;Lateral trunk lean to left Stairs / Additional Locomotion Stairs: No Ramp: 5: Psychiatric nurse: Yes Wheelchair Assistance: 6: Modified independent (Device/Increase time) Environmental health practitioner: Both upper extremities Wheelchair Parts Management: Independent Distance: 300  Trunk/Postural Assessment  Thoracic Strength Overall Thoracic Strength Comments: rounded shoulders Lumbar Strength Overall Lumbar Strength Comments: Pt sits with increased posterior pelvic tilt   Balance Balance Balance Assessed: Yes Static Sitting Balance Static Sitting - Balance Support: Bilateral upper extremity  supported;Feet supported Static Sitting - Level of Assistance: 5: Stand by assistance Dynamic Sitting Balance Dynamic Sitting - Balance Support: No upper extremity supported;Feet supported Dynamic Sitting - Level of Assistance: 5: Stand by assistance Static Standing Balance Static Standing - Balance Support: Bilateral upper extremity supported Static Standing - Level of Assistance: 5: Stand by assistance Dynamic Standing Balance Dynamic Standing - Balance Support: Left upper extremity supported;During functional activity Dynamic Standing - Level of Assistance: 5: Stand by assistance Dynamic Standing - Balance Activities: Reaching across midline;Reaching for objects Extremity Assessment      RLE Assessment RLE Assessment: Within Functional Limits LLE Strength LLE Overall Strength Comments: Pt continues to have overall weakness in hip and knee motions, but does shoe increased strength in knee motions when performing exercises.   See FIM for current functional status  Denice Bors 11/28/2013, 12:55 PM

## 2013-11-28 NOTE — Progress Notes (Signed)
Rx written for norco 5/325 mg # 30 pills--use every 6 hours prn pain

## 2013-11-28 NOTE — Progress Notes (Signed)
Occupational Therapy Discharge Summary  Patient Details  Name: [name] [name] MRN: [id number] Date of Birth: August 10, 1946  Today's Date: 11/28/2013 Time: 0902-0958 Time Calculation (min): 56 min  Patient has met 9 of 9 long term goals due to improved activity tolerance and improved balance.  Patient to discharge at overall Supervision level.  Patient's care partner is independent and unavailable to provide the necessary physical assistance at discharge.    Reasons goals not met: NA  Recommendation:  Patient will benefit from ongoing skilled OT services in home health setting to continue to advance functional skills in the area of BADL.  Mr. Heeney still requires supervision for functional transfers and selfcare sit to stand.  He is still at a high risk to fall if left alone so will continue to need 24 hour supervision for safety and home health OT to progress to a modified independent level.  Have instructed pt and wife to use 3:1 for toileting and pt plans to sponge bathe as his bathroom is too small to support a tub bench.    Equipment: No equipment provided  Reasons for discharge: treatment goals met and discharge from hospital  Patient/family agrees with progress made and goals achieved: Yes  OT Discharge Precautions/Restrictions  Precautions Precautions: Fall Precaution Comments: L BKA, R toe wound, blackened heel on R Restrictions Weight Bearing Restrictions: Yes LLE Weight Bearing: Non weight bearing  Pain Pain Assessment Pain Assessment: No/denies pain Pain Score: 0-No pain ADL  See FIM scale  Vision/Perception  Vision- History Baseline Vision/History: No visual deficits Patient Visual Report: No change from baseline Vision- Assessment Vision Assessment?: No apparent visual deficits  Cognition Overall Cognitive Status: Within Functional Limits for tasks assessed Arousal/Alertness: Awake/alert Orientation Level: Oriented X4 Attention:  Selective;Alternating Sustained Attention: Appears intact Selective Attention: Appears intact Alternating Attention: Appears intact Memory: Impaired Memory Impairment: Decreased short term memory Awareness: Appears intact Problem Solving: Impaired Safety/Judgment: Appears intact Comments: Pt still requires min instructiona cueing at times for hand placement with sit to stand and functional transfers secondary to decreased short term memory. Sensation Sensation Light Touch: Appears Intact Stereognosis: Appears Intact Hot/Cold: Appears Intact Proprioception: Appears Intact Coordination Gross Motor Movements are Fluid and Coordinated: Yes Fine Motor Movements are Fluid and Coordinated: Yes Motor  Motor Motor - Discharge Observations: Pt continues to demonstrate posterior pelvic tilt in sitting, however overall posture has improved.  He also continues to demonstrate decreased balance when standing with RW Mobility  Bed Mobility Bed Mobility: Supine to Sit;Sit to Supine Supine to Sit: 6: Modified independent (Device/Increase time) Sit to Supine: 6: Modified independent (Device/Increase time) Transfers Transfers: Sit to Stand;Stand to Sit Sit to Stand: 5: Supervision Sit to Stand Details: Verbal cues for sequencing;Verbal cues for technique;Verbal cues for precautions/safety Stand to Sit: 5: Supervision Stand to Sit Details (indicate cue type and reason): Verbal cues for sequencing;Verbal cues for technique;Verbal cues for precautions/safety  Trunk/Postural Assessment  Cervical Assessment Cervical Assessment: Exceptions to [facility] Cervical Strength Overall Cervical Strength Comments: slight cervical protraction Thoracic Assessment Thoracic Assessment: Within Functional Limits Thoracic Strength Overall Thoracic Strength Comments: rounded shoulders Lumbar Assessment Lumbar Assessment: Exceptions to [facility] Lumbar Strength Overall Lumbar Strength Comments: Pt sits with increased posterior  pelvic tilt  Postural Control Postural Control: Within Functional Limits  Balance Balance Balance Assessed: Yes Static Sitting Balance Static Sitting - Balance Support: Bilateral upper extremity supported;Feet supported Static Sitting - Level of Assistance: 6: Modified independent (Device/Increase time) Dynamic Sitting Balance Dynamic Sitting - Balance Support: No  upper extremity supported;Feet supported Dynamic Sitting - Level of Assistance: 6: Modified independent (Device/Increase time) Static Standing Balance Static Standing - Balance Support: Bilateral upper extremity supported Static Standing - Level of Assistance: 5: Stand by assistance Dynamic Standing Balance Dynamic Standing - Balance Support: Left upper extremity supported;During functional activity Dynamic Standing - Level of Assistance: 5: Stand by assistance Dynamic Standing - Balance Activities: Reaching across midline;Reaching for objects Extremity/Trunk Assessment RUE Assessment RUE Assessment: Within Functional Limits LUE Assessment LUE Assessment: Within Functional Limits  See FIM for current functional status  Makalyn Lennox OTR/L 11/28/2013, 4:18 PM

## 2013-11-29 ENCOUNTER — Inpatient Hospital Stay (HOSPITAL_COMMUNITY): Payer: Medicare Other | Admitting: Rehabilitation

## 2013-11-29 ENCOUNTER — Inpatient Hospital Stay (HOSPITAL_COMMUNITY): Payer: Medicare Other | Admitting: Occupational Therapy

## 2013-11-29 ENCOUNTER — Encounter (HOSPITAL_COMMUNITY): Payer: Medicare Other | Admitting: Occupational Therapy

## 2013-12-12 ENCOUNTER — Telehealth: Payer: Self-pay

## 2013-12-12 NOTE — Telephone Encounter (Signed)
Patient wife called to see if they can get an earlier appointment.  Left message for them to call back to schedule.  She is also requesting a referral to wound care.  Please advise.

## 2013-12-13 NOTE — Telephone Encounter (Signed)
Dr Erlinda Hong is in charge of wound care and he would need to make referral to Wound center.  We can see sooner if I have a cancellation

## 2013-12-13 NOTE — Telephone Encounter (Signed)
Left message for patient to call office to schedule earlier appointment.  Also patient needs to follow up with Dr Erlinda Hong about wound care.

## 2013-12-15 DIAGNOSIS — I739 Peripheral vascular disease, unspecified: Secondary | ICD-10-CM

## 2013-12-15 DIAGNOSIS — I5022 Chronic systolic (congestive) heart failure: Secondary | ICD-10-CM

## 2013-12-15 DIAGNOSIS — E1165 Type 2 diabetes mellitus with hyperglycemia: Secondary | ICD-10-CM

## 2013-12-15 DIAGNOSIS — I509 Heart failure, unspecified: Secondary | ICD-10-CM

## 2013-12-15 DIAGNOSIS — L89109 Pressure ulcer of unspecified part of back, unspecified stage: Secondary | ICD-10-CM

## 2013-12-15 DIAGNOSIS — G609 Hereditary and idiopathic neuropathy, unspecified: Secondary | ICD-10-CM

## 2013-12-15 DIAGNOSIS — L97509 Non-pressure chronic ulcer of other part of unspecified foot with unspecified severity: Secondary | ICD-10-CM | POA: Diagnosis not present

## 2013-12-15 DIAGNOSIS — IMO0002 Reserved for concepts with insufficient information to code with codable children: Secondary | ICD-10-CM | POA: Diagnosis not present

## 2013-12-15 DIAGNOSIS — Z4789 Encounter for other orthopedic aftercare: Secondary | ICD-10-CM | POA: Diagnosis not present

## 2013-12-15 DIAGNOSIS — L8992 Pressure ulcer of unspecified site, stage 2: Secondary | ICD-10-CM

## 2013-12-15 DIAGNOSIS — M9080 Osteopathy in diseases classified elsewhere, unspecified site: Secondary | ICD-10-CM | POA: Diagnosis not present

## 2013-12-15 DIAGNOSIS — L97409 Non-pressure chronic ulcer of unspecified heel and midfoot with unspecified severity: Secondary | ICD-10-CM

## 2013-12-15 DIAGNOSIS — E1169 Type 2 diabetes mellitus with other specified complication: Secondary | ICD-10-CM

## 2013-12-16 ENCOUNTER — Encounter (HOSPITAL_BASED_OUTPATIENT_CLINIC_OR_DEPARTMENT_OTHER): Payer: Medicare Other | Attending: General Surgery

## 2013-12-16 DIAGNOSIS — L97509 Non-pressure chronic ulcer of other part of unspecified foot with unspecified severity: Secondary | ICD-10-CM | POA: Insufficient documentation

## 2013-12-16 DIAGNOSIS — E1169 Type 2 diabetes mellitus with other specified complication: Secondary | ICD-10-CM | POA: Insufficient documentation

## 2013-12-22 ENCOUNTER — Encounter: Payer: Self-pay | Admitting: *Deleted

## 2013-12-23 ENCOUNTER — Encounter (HOSPITAL_BASED_OUTPATIENT_CLINIC_OR_DEPARTMENT_OTHER): Payer: Medicare Other | Attending: General Surgery

## 2013-12-23 DIAGNOSIS — E1169 Type 2 diabetes mellitus with other specified complication: Secondary | ICD-10-CM | POA: Insufficient documentation

## 2013-12-23 DIAGNOSIS — L97509 Non-pressure chronic ulcer of other part of unspecified foot with unspecified severity: Secondary | ICD-10-CM | POA: Insufficient documentation

## 2013-12-24 ENCOUNTER — Other Ambulatory Visit: Payer: Self-pay | Admitting: Internal Medicine

## 2013-12-27 ENCOUNTER — Ambulatory Visit (INDEPENDENT_AMBULATORY_CARE_PROVIDER_SITE_OTHER): Payer: Medicare Other | Admitting: Cardiovascular Disease

## 2013-12-27 ENCOUNTER — Encounter: Payer: Self-pay | Admitting: Cardiovascular Disease

## 2013-12-27 ENCOUNTER — Encounter (HOSPITAL_COMMUNITY): Payer: Self-pay | Admitting: Pharmacy Technician

## 2013-12-27 VITALS — BP 98/56 | HR 88 | Ht 69.0 in | Wt 175.0 lb

## 2013-12-27 DIAGNOSIS — I999 Unspecified disorder of circulatory system: Secondary | ICD-10-CM

## 2013-12-27 DIAGNOSIS — I998 Other disorder of circulatory system: Secondary | ICD-10-CM

## 2013-12-27 DIAGNOSIS — Z79899 Other long term (current) drug therapy: Secondary | ICD-10-CM

## 2013-12-27 DIAGNOSIS — R5381 Other malaise: Secondary | ICD-10-CM

## 2013-12-27 DIAGNOSIS — D689 Coagulation defect, unspecified: Secondary | ICD-10-CM

## 2013-12-27 DIAGNOSIS — I70229 Atherosclerosis of native arteries of extremities with rest pain, unspecified extremity: Secondary | ICD-10-CM

## 2013-12-27 DIAGNOSIS — Z01818 Encounter for other preprocedural examination: Secondary | ICD-10-CM

## 2013-12-27 DIAGNOSIS — R5383 Other fatigue: Secondary | ICD-10-CM

## 2013-12-27 LAB — CBC
HCT: 35.1 % — ABNORMAL LOW (ref 39.0–52.0)
Hemoglobin: 11.2 g/dL — ABNORMAL LOW (ref 13.0–17.0)
MCH: 23.5 pg — AB (ref 26.0–34.0)
MCHC: 31.9 g/dL (ref 30.0–36.0)
MCV: 73.6 fL — AB (ref 78.0–100.0)
PLATELETS: 296 10*3/uL (ref 150–400)
RBC: 4.77 MIL/uL (ref 4.22–5.81)
RDW: 19.8 % — AB (ref 11.5–15.5)
WBC: 5.9 10*3/uL (ref 4.0–10.5)

## 2013-12-27 LAB — BASIC METABOLIC PANEL
BUN: 20 mg/dL (ref 6–23)
CHLORIDE: 106 meq/L (ref 96–112)
CO2: 27 meq/L (ref 19–32)
CREATININE: 0.98 mg/dL (ref 0.50–1.35)
Calcium: 9.3 mg/dL (ref 8.4–10.5)
Glucose, Bld: 91 mg/dL (ref 70–99)
Potassium: 4.6 mEq/L (ref 3.5–5.3)
Sodium: 143 mEq/L (ref 135–145)

## 2013-12-27 LAB — APTT: aPTT: 37 seconds (ref 24–37)

## 2013-12-27 LAB — PROTIME-INR
INR: 1.02 (ref <1.50)
Prothrombin Time: 13.3 seconds (ref 11.6–15.2)

## 2013-12-27 LAB — TSH: TSH: 4.897 u[IU]/mL — ABNORMAL HIGH (ref 0.350–4.500)

## 2013-12-27 NOTE — Patient Instructions (Signed)
Dr. Gwenlyn Found has ordered a peripheral angiogram to be done at [facility].  This procedure is going to look at the bloodflow in your lower extremities.  If Dr. Gwenlyn Found is able to open up the arteries, you will have to spend one night in the hospital.  If he is not able to open the arteries, you will be able to go home that same day.    After the procedure, you will not be allowed to drive for 3 days or push, pull, or lift anything greater than 10 lbs for one week.    You will be required to have bloodwork and a chest xray prior to your procedure.  Our scheduler will advise you on when these items need to be done.   Please have this done at the [place] ([place]).   Reps - Noelle Penner

## 2013-12-27 NOTE — Progress Notes (Signed)
12/27/2013 [name] [name]   1945-09-24  [id number]  Primary Physician [name] Screws, MD Primary Cardiologist: [name] Harp MD [name] [name]   HPI:  [name] [name] is a 68 year old married African American male referred by [name] [name] from the [facility] wound care center for evaluation of critical limb ischemia. [name] [name] is his cardiologist in heart care. He has a history of nonischemic cardiomyopathy status post ICD implantation for primary prevention. His other problems include hypertension, insulin-dependent diabetes, chronic atrial fibrillation. He does not smoke. He denies chest pain or shortness of breath. He just saw [name] [name] in the office one month ago. He developed an Left great toe ulcer back in August and had arterial Doppler studies in our office suggesting widely patent arteries. Over last 2 weeks he developed left great toe nonhealing ulcer with what appears to be some dry gangrenous changes on the right great toe. His right heel ulcer has healed but he still has a reactive left and also with a wound VAC. I obtained arterial Dopplers on him 09/01/13 showed a left ABI of 0.44 with occluded posterior tibial and peroneal arteries. I suspect that he does not have an intact pedal arch and probably will not heal his right great toe ulcer without improvement in Pedal blood flow. As a result I have scheduled him to undergo angiography and potential intervention for limb salvage. He did undergo left in the patient by [name] [name] on 11/09/13.    Current Outpatient Prescriptions  Medication Sig Dispense Refill  . aspirin 81 MG tablet Take 81 mg by mouth daily.      [name] atorvastatin (LIPITOR) 10 MG tablet Take 10 mg by mouth daily.        . carvedilol (COREG) 12.5 MG tablet Take 12.5 mg by mouth 2 (two) times daily with a meal.      . clopidogrel (PLAVIX) 75 MG tablet Take 1 tablet (75 mg total) by mouth daily with breakfast.  30 tablet  6  . esomeprazole (NEXIUM) 40  MG capsule Take 40 mg by mouth daily as needed (acid reflux).       . furosemide (LASIX) 40 MG tablet Take 1 tablet (40 mg total) by mouth 2 (two) times daily.  60 tablet  1  . insulin detemir (LEVEMIR) 100 UNIT/ML injection Inject 0.35 mLs (35 Units total) into the skin at bedtime.  10 mL  11  . iron polysaccharides (NIFEREX) 150 MG capsule Take 1 capsule (150 mg total) by mouth daily.  30 capsule  1  . metFORMIN (GLUCOPHAGE) 500 MG tablet Take 1 tablet (500 mg total) by mouth 2 (two) times daily.  60 tablet    . Multiple Vitamins-Minerals (MULTIVITAMIN PO) Take 1 tablet by mouth daily.      [name] NOVOLOG 100 UNIT/ML injection Inject 4 Units into the skin as needed for high blood sugar.       . potassium chloride SA (K-DUR,KLOR-CON) 20 MEQ tablet Take 1 tablet (20 mEq total) by mouth 2 (two) times daily.  60 tablet  1  . pregabalin (LYRICA) 75 MG capsule Take 1 capsule (75 mg total) by mouth 2 (two) times daily.  60 capsule  1  . senna-docusate (SENOKOT-S) 8.6-50 MG per tablet Take 1 tablet by mouth 2 (two) times daily.  60 tablet  1  . silver sulfADIAZINE (SILVADENE) 1 % cream Apply 1 application topically daily.       . vitamin E (VITAMIN E) 1000 UNIT  capsule Take 1,000 Units by mouth daily.      [name] spironolactone (ALDACTONE) 25 MG tablet Take 12.5 mg by mouth daily.       No current facility-administered medications for this visit.    No Known Allergies  History   Social History  . Marital Status: Married    Spouse Name: N/A    Number of Children: N/A  . Years of Education: N/A   Occupational History  . Not on file.   Social History Main Topics  . Smoking status: Never Smoker   . Smokeless tobacco: Never Used  . Alcohol Use: No  . Drug Use: No  . Sexual Activity: Yes   Other Topics Concern  . Not on file   Social History Narrative  . No narrative on file     Review of Systems: General: negative for chills, fever, night sweats or weight changes.  Cardiovascular: negative  for chest pain, dyspnea on exertion, edema, orthopnea, palpitations, paroxysmal nocturnal dyspnea or shortness of breath Dermatological: negative for rash Respiratory: negative for cough or wheezing Urologic: negative for hematuria Abdominal: negative for nausea, vomiting, diarrhea, bright red blood per rectum, melena, or hematemesis Neurologic: negative for visual changes, syncope, or dizziness All other systems reviewed and are otherwise negative except as noted above.    Blood pressure 98/56, pulse 88, height 5\' 9"  (1.753 m), weight 175 lb (79.379 kg).  General appearance: alert and no distress Neck: no adenopathy, no carotid bruit, no JVD, supple, symmetrical, trachea midline and thyroid not enlarged, symmetric, no tenderness/mass/nodules Lungs: clear to auscultation bilaterally Heart: regular rate and rhythm, S1, S2 normal, no murmur, click, rub or gallop Extremities: status post left BKA, ischemic ulcers on right first second and third pedis  EKG not performed today  ASSESSMENT AND PLAN:   Critical lower limb ischemia [name] has critical limb ischemia. I performed diamondback orbital rotational atherectomy and angioplasty of his left anterior tibial artery back 09/12/13. He'll underwent left below-the-knee amputation by [name] [name] on 11/09/13. He was referred back today by [name] [name] at the wound care center for critical limb ischemia involving his right first second and third toes. At angiography back in January he had diffusely diseased right anterior and posterior tibial arteries. His peroneal was intact. I do not think he would heal a transmetatarsal or toe amputation without restoring flow to his pedal arch . I am going to arrange to perform right anterior and posterior tibial artery intervention this coming Monday for limb salvage.      [name] Harp MD FACP,FACC,FAHA, [facility] 12/27/2013 8:35 AM

## 2013-12-27 NOTE — Assessment & Plan Note (Signed)
Mr. Schunk has critical limb ischemia. I performed diamondback orbital rotational atherectomy and angioplasty of his left anterior tibial artery back 09/12/13. He'll underwent left below-the-knee amputation by Dr. Erlinda Hong on 11/09/13. He was referred back today by Dr. Judene Companion at the wound care center for critical limb ischemia involving his right first second and third toes. At angiography back in January he had diffusely diseased right anterior and posterior tibial arteries. His peroneal was intact. I do not think he would heal a transmetatarsal or toe amputation without restoring flow to his pedal arch . I am going to arrange to perform right anterior and posterior tibial artery intervention this coming Monday for limb salvage.

## 2013-12-29 ENCOUNTER — Ambulatory Visit (HOSPITAL_COMMUNITY)
Admission: RE | Admit: 2013-12-29 | Discharge: 2013-12-30 | Disposition: A | Discharge status: Home / Self Care | Payer: Medicare Other | Source: Ambulatory Visit | Attending: Cardiovascular Disease | Admitting: Cardiovascular Disease

## 2013-12-29 ENCOUNTER — Encounter (HOSPITAL_COMMUNITY)
Admission: RE | Disposition: A | Discharge status: Home / Self Care | Payer: Self-pay | Source: Ambulatory Visit | Attending: Cardiovascular Disease

## 2013-12-29 ENCOUNTER — Other Ambulatory Visit: Payer: Self-pay | Admitting: *Deleted

## 2013-12-29 DIAGNOSIS — I70229 Atherosclerosis of native arteries of extremities with rest pain, unspecified extremity: Secondary | ICD-10-CM | POA: Diagnosis present

## 2013-12-29 DIAGNOSIS — Z89519 Acquired absence of unspecified leg below knee: Secondary | ICD-10-CM

## 2013-12-29 DIAGNOSIS — E119 Type 2 diabetes mellitus without complications: Secondary | ICD-10-CM | POA: Insufficient documentation

## 2013-12-29 DIAGNOSIS — Z7902 Long term (current) use of antithrombotics/antiplatelets: Secondary | ICD-10-CM | POA: Insufficient documentation

## 2013-12-29 DIAGNOSIS — S88119A Complete traumatic amputation at level between knee and ankle, unspecified lower leg, initial encounter: Secondary | ICD-10-CM | POA: Insufficient documentation

## 2013-12-29 DIAGNOSIS — I4891 Unspecified atrial fibrillation: Secondary | ICD-10-CM | POA: Insufficient documentation

## 2013-12-29 DIAGNOSIS — I10 Essential (primary) hypertension: Secondary | ICD-10-CM | POA: Insufficient documentation

## 2013-12-29 DIAGNOSIS — Z95810 Presence of automatic (implantable) cardiac defibrillator: Secondary | ICD-10-CM | POA: Insufficient documentation

## 2013-12-29 DIAGNOSIS — Z794 Long term (current) use of insulin: Secondary | ICD-10-CM | POA: Insufficient documentation

## 2013-12-29 DIAGNOSIS — I739 Peripheral vascular disease, unspecified: Secondary | ICD-10-CM

## 2013-12-29 DIAGNOSIS — L97509 Non-pressure chronic ulcer of other part of unspecified foot with unspecified severity: Secondary | ICD-10-CM | POA: Insufficient documentation

## 2013-12-29 DIAGNOSIS — I428 Other cardiomyopathies: Secondary | ICD-10-CM | POA: Diagnosis present

## 2013-12-29 DIAGNOSIS — Z7982 Long term (current) use of aspirin: Secondary | ICD-10-CM | POA: Insufficient documentation

## 2013-12-29 DIAGNOSIS — E785 Hyperlipidemia, unspecified: Secondary | ICD-10-CM | POA: Diagnosis present

## 2013-12-29 DIAGNOSIS — I447 Left bundle-branch block, unspecified: Secondary | ICD-10-CM | POA: Insufficient documentation

## 2013-12-29 DIAGNOSIS — L98499 Non-pressure chronic ulcer of skin of other sites with unspecified severity: Principal | ICD-10-CM | POA: Insufficient documentation

## 2013-12-29 DIAGNOSIS — I480 Paroxysmal atrial fibrillation: Secondary | ICD-10-CM | POA: Diagnosis present

## 2013-12-29 DIAGNOSIS — I998 Other disorder of circulatory system: Secondary | ICD-10-CM

## 2013-12-29 HISTORY — PX: LOWER EXTREMITY ANGIOGRAM: SHX5508

## 2013-12-29 HISTORY — PX: ATHERECTOMY: SHX5502

## 2013-12-29 LAB — GLUCOSE, CAPILLARY
GLUCOSE-CAPILLARY: 93 mg/dL (ref 70–99)
Glucose-Capillary: 72 mg/dL (ref 70–99)
Glucose-Capillary: 157 mg/dL — ABNORMAL HIGH (ref 70–99)
Glucose-Capillary: 225 mg/dL — ABNORMAL HIGH (ref 70–99)

## 2013-12-29 LAB — POCT ACTIVATED CLOTTING TIME
ACTIVATED CLOTTING TIME: 254 s
Activated Clotting Time: 199 seconds
Activated Clotting Time: 205 seconds
Activated Clotting Time: 166 seconds

## 2013-12-29 SURGERY — ANGIOGRAM, LOWER EXTREMITY
Anesthesia: LOCAL | Laterality: Right

## 2013-12-29 MED ORDER — PANTOPRAZOLE SODIUM 40 MG PO TBEC
40.0000 mg | DELAYED_RELEASE_TABLET | Freq: Every day | ORAL | Status: DC
  Administered 2013-12-29: 21:00:00 40 mg via ORAL
  Filled 2013-12-29: qty 1

## 2013-12-29 MED ORDER — CLOPIDOGREL BISULFATE 75 MG PO TABS
75.0000 mg | ORAL_TABLET | Freq: Every day | ORAL | Status: DC
  Administered 2013-12-30: 10:00:00 75 mg via ORAL
  Filled 2013-12-29: qty 1

## 2013-12-29 MED ORDER — ATORVASTATIN CALCIUM 10 MG PO TABS
10.0000 mg | ORAL_TABLET | Freq: Every day | ORAL | Status: DC
  Administered 2013-12-29 – 2013-12-30 (×2): 10 mg via ORAL
  Filled 2013-12-29 (×2): qty 1

## 2013-12-29 MED ORDER — MIDAZOLAM HCL 2 MG/2ML IJ SOLN
INTRAMUSCULAR | Status: AC
  Filled 2013-12-29: qty 2

## 2013-12-29 MED ORDER — POLYSACCHARIDE IRON COMPLEX 150 MG PO CAPS
150.0000 mg | ORAL_CAPSULE | Freq: Every day | ORAL | Status: DC
  Administered 2013-12-29 – 2013-12-30 (×2): 150 mg via ORAL
  Filled 2013-12-29 (×2): qty 1

## 2013-12-29 MED ORDER — PREGABALIN 25 MG PO CAPS
75.0000 mg | ORAL_CAPSULE | Freq: Two times a day (BID) | ORAL | Status: DC
  Administered 2013-12-29 – 2013-12-30 (×2): 75 mg via ORAL
  Filled 2013-12-29 (×2): qty 3

## 2013-12-29 MED ORDER — DIAZEPAM 5 MG PO TABS
5.0000 mg | ORAL_TABLET | ORAL | Status: AC
  Administered 2013-12-29: 5 mg via ORAL

## 2013-12-29 MED ORDER — FENTANYL CITRATE 0.05 MG/ML IJ SOLN
INTRAMUSCULAR | Status: AC
  Filled 2013-12-29: qty 2

## 2013-12-29 MED ORDER — FUROSEMIDE 40 MG PO TABS
40.0000 mg | ORAL_TABLET | Freq: Two times a day (BID) | ORAL | Status: DC
  Administered 2013-12-29 – 2013-12-30 (×2): 40 mg via ORAL
  Filled 2013-12-29 (×4): qty 1

## 2013-12-29 MED ORDER — POTASSIUM CHLORIDE CRYS ER 20 MEQ PO TBCR
20.0000 meq | EXTENDED_RELEASE_TABLET | Freq: Two times a day (BID) | ORAL | Status: DC
  Administered 2013-12-29 – 2013-12-30 (×2): 20 meq via ORAL
  Filled 2013-12-29 (×3): qty 1

## 2013-12-29 MED ORDER — INSULIN DETEMIR 100 UNIT/ML ~~LOC~~ SOLN
35.0000 [IU] | Freq: Every day | SUBCUTANEOUS | Status: DC
  Administered 2013-12-29: 22:00:00 35 [IU] via SUBCUTANEOUS
  Filled 2013-12-29 (×2): qty 0.35

## 2013-12-29 MED ORDER — NITROGLYCERIN 0.2 MG/ML ON CALL CATH LAB
INTRAVENOUS | Status: AC
  Filled 2013-12-29: qty 1

## 2013-12-29 MED ORDER — VERAPAMIL HCL 2.5 MG/ML IV SOLN
INTRAVENOUS | Status: AC
  Filled 2013-12-29: qty 2

## 2013-12-29 MED ORDER — HEPARIN (PORCINE) IN NACL 2-0.9 UNIT/ML-% IJ SOLN
INTRAMUSCULAR | Status: AC
  Filled 2013-12-29: qty 1000

## 2013-12-29 MED ORDER — SODIUM CHLORIDE 0.9 % IV SOLN
INTRAVENOUS | Status: DC
  Administered 2013-12-29: 12:00:00 via INTRAVENOUS

## 2013-12-29 MED ORDER — ASPIRIN EC 325 MG PO TBEC
325.0000 mg | DELAYED_RELEASE_TABLET | Freq: Every day | ORAL | Status: DC
  Administered 2013-12-30: 325 mg via ORAL
  Filled 2013-12-29: qty 1

## 2013-12-29 MED ORDER — SODIUM CHLORIDE 0.9 % IV SOLN: INTRAVENOUS | Status: AC

## 2013-12-29 MED ORDER — LIDOCAINE HCL (PF) 1 % IJ SOLN
INTRAMUSCULAR | Status: AC
  Filled 2013-12-29: qty 30

## 2013-12-29 MED ORDER — INSULIN ASPART 100 UNIT/ML ~~LOC~~ SOLN: 4.0000 [IU] | Freq: Every day | SUBCUTANEOUS | Status: DC | PRN

## 2013-12-29 MED ORDER — SPIRONOLACTONE 12.5 MG HALF TABLET
12.5000 mg | ORAL_TABLET | Freq: Every day | ORAL | Status: DC
  Administered 2013-12-29 – 2013-12-30 (×2): 12.5 mg via ORAL
  Filled 2013-12-29 (×2): qty 1

## 2013-12-29 MED ORDER — ASPIRIN 81 MG PO CHEW
CHEWABLE_TABLET | ORAL | Status: AC
  Administered 2013-12-29: 81 mg via ORAL
  Filled 2013-12-29: qty 1

## 2013-12-29 MED ORDER — ASPIRIN 81 MG PO CHEW
81.0000 mg | CHEWABLE_TABLET | ORAL | Status: AC
  Administered 2013-12-29: 81 mg via ORAL

## 2013-12-29 MED ORDER — MORPHINE SULFATE 2 MG/ML IJ SOLN: 1.0000 mg | INTRAMUSCULAR | Status: DC | PRN

## 2013-12-29 MED ORDER — SODIUM CHLORIDE 0.9 % IJ SOLN: 3.0000 mL | INTRAMUSCULAR | Status: DC | PRN

## 2013-12-29 MED ORDER — CARVEDILOL 12.5 MG PO TABS
12.5000 mg | ORAL_TABLET | Freq: Two times a day (BID) | ORAL | Status: DC
  Administered 2013-12-30: 10:00:00 12.5 mg via ORAL
  Filled 2013-12-29 (×2): qty 1

## 2013-12-29 MED ORDER — DIAZEPAM 5 MG PO TABS
ORAL_TABLET | ORAL | Status: AC
  Administered 2013-12-29: 5 mg via ORAL
  Filled 2013-12-29: qty 1

## 2013-12-29 MED ORDER — SENNOSIDES-DOCUSATE SODIUM 8.6-50 MG PO TABS
1.0000 | ORAL_TABLET | Freq: Two times a day (BID) | ORAL | Status: DC
  Administered 2013-12-29 – 2013-12-30 (×2): 1 via ORAL
  Filled 2013-12-29 (×3): qty 1

## 2013-12-29 NOTE — Consult Note (Signed)
ORTHOPAEDIC CONSULTATION  REQUESTING PHYSICIAN: Lorretta Harp, MD  Chief Complaint: Right toe osteo  HPI: [name] [name] is a 68 y.o. male who has followed by me for right 1st-3rd toe ulcers/osteo underwent revasc by his cardiologist, Dr. Gwenlyn Found today.  He was able to restore his anterior and posterior tibial arteries.  Patient seen for determination of timing of surgery for toe amputation.  Past Medical History  Diagnosis Date  . GERD (gastroesophageal reflux disease)   . Obesity   . Cardiomyopathy, nonischemic     a. Cath 2003: mild nonobstructive CAD, EF 25% at that time.  . Hypertension   . Chronic systolic CHF (congestive heart failure)     a. NICM EF 25% dating back to at least 2003.  [name] PAF (paroxysmal atrial fibrillation)     a. Noted on ICD interrogation 2012;  b. coumadin d/c'd 01/2013.  [name] NSVT (nonsustained ventricular tachycardia)     a. Noted on ICD interrogation in 2011.  [name] LBBB (left bundle branch block)   . Kidney stone   . Critical lower limb ischemia   . High cholesterol   . PAD (peripheral artery disease)     a. 08/2013 Periph Angio/PTA: Abd Ao nl, RLE- 3v runoff, PT diff dzs, AT 90p, LLE 2v runoff, PT 100, AT 62m (diamondback ORA/chocolate balloon PTA).  . Type II diabetes mellitus   . Uncontrolled pain, Lt toe 09/21/2013  . Gangrenous toe, Lt toe 09/21/2013  . CAD (coronary artery disease)     a. Nonobstructive by cath 09/2001.  [name] Automatic implantable cardioverter-defibrillator in situ     a. 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  . Dementia   . Arthritis    Past Surgical History  Procedure Laterality Date  . Lithotripsy  2001  . Cervical spine surgery  1994  . Cardiac defibrillator placement  06/2009    WITH GENERATOR REPLACED; BiV ICD  . US echocardiography  03/21/2008    EF 30-35%  . Cardiovascular stress test  03/20/2009    EF 33%  . Transluminal atherectomy tibial artery Left 09/12/2013  . Cardiac  catheterization  10/01/2001    THERE WAS GLOBAL HYPOKINESIS AND EF 25%. THERE APPEARED TO BE GLOBAL DECREASE IN WALL MOTION  . Toe amputation  10/04/2013    LEFT GREAT TOE AND 4TH TOE   /   DR Erlinda Hong  . Amputation Left 10/04/2013    Procedure: LEFT GREAT TOE AND SECOND TOE AMPUTATION;  Surgeon: Marianna Payment, MD;  Location: [facility];  Service: Orthopedics;  Laterality: Left;  . Colonoscopy    . Leg amputation below knee Left 11/09/2013    DR Erlinda Hong  . Amputation Left 11/09/2013    Procedure: LEFT AMPUTATION BELOW KNEE;  Surgeon: Marianna Payment, MD;  Location: [place] [facility];  Service: Orthopedics;  Laterality: Left;   History   Social History  . Marital Status: Married    Spouse Name: N/A    Number of Children: N/A  . Years of Education: N/A   Social History Main Topics  . Smoking status: Never Smoker   . Smokeless tobacco: Never Used  . Alcohol Use: No  . Drug Use: No  . Sexual Activity: Yes   Other Topics Concern  . Not on file   Social History Narrative  . No narrative on file   Family History  Problem Relation Age of Onset  . Heart disease Mother   . Hypertension Mother   . Diabetes Mother   .  Diabetes Father    No Known Allergies Prior to Admission medications   Medication Sig Start Date End Date Taking? Authorizing Provider  aspirin EC 325 MG tablet Take 325 mg by mouth daily.   Yes Historical Provider, MD  atorvastatin (LIPITOR) 10 MG tablet Take 10 mg by mouth daily.     Yes Historical Provider, MD  carvedilol (COREG) 12.5 MG tablet Take 12.5 mg by mouth 2 (two) times daily with a meal.   Yes Historical Provider, MD  clopidogrel (PLAVIX) 75 MG tablet Take 75 mg by mouth daily with breakfast.   Yes Historical Provider, MD  esomeprazole (NEXIUM) 40 MG capsule Take 40 mg by mouth daily as needed (acid reflux).    Yes Historical Provider, MD  furosemide (LASIX) 40 MG tablet Take 1 tablet (40 mg total) by mouth 2 (two) times daily. 06/28/13  Yes Deboraha Sprang, MD  insulin detemir  (LEVEMIR) 100 UNIT/ML injection Inject 0.35 mLs (35 Units total) into the skin at bedtime. 11/28/13  Yes Ivan Anchors Love, PA-C  iron polysaccharides (NIFEREX) 150 MG capsule Take 1 capsule (150 mg total) by mouth daily. 11/28/13  Yes Ivan Anchors Love, PA-C  metFORMIN (GLUCOPHAGE) 500 MG tablet Take 1 tablet (500 mg total) by mouth 2 (two) times daily. 11/28/13  Yes Ivan Anchors Love, PA-C  Multiple Vitamins-Minerals (MULTIVITAMIN PO) Take 1 tablet by mouth daily.   Yes Historical Provider, MD  NOVOLOG 100 UNIT/ML injection Inject 4 Units into the skin daily as needed for high blood sugar (if cbg is 145 or greater).  04/20/13  Yes Historical Provider, MD  potassium chloride SA (K-DUR,KLOR-CON) 20 MEQ tablet Take 1 tablet (20 mEq total) by mouth 2 (two) times daily. 11/28/13  Yes Ivan Anchors Love, PA-C  pregabalin (LYRICA) 75 MG capsule Take 1 capsule (75 mg total) by mouth 2 (two) times daily. 11/28/13  Yes Ivan Anchors Love, PA-C  senna-docusate (SENOKOT-S) 8.6-50 MG per tablet Take 1 tablet by mouth 2 (two) times daily. 11/28/13  Yes Ivan Anchors Love, PA-C  silver sulfADIAZINE (SILVADENE) 1 % cream Apply 1 application topically daily.  09/26/13  Yes Historical Provider, MD  spironolactone (ALDACTONE) 25 MG tablet Take 12.5 mg by mouth daily. 11/28/13  Yes Historical Provider, MD  vitamin E (VITAMIN E) 1000 UNIT capsule Take 1,000 Units by mouth daily.   Yes Historical Provider, MD   No results found.  Positive ROS: All other systems have been reviewed and were otherwise negative with the exception of those mentioned in the HPI and as above.  Physical Exam: MUSCULOSKELETAL:  Wagner stage 3 ulcers of 1st -3rd toes  Assessment: Diabetic toe ulcers/osteo  Plan: - patient and wife advised to stop taking plavix, may continue baby aspirin - will plan for toe amputations this coming Wednesday - patient has been given cardiac clearance for surgery - may dc home tomorrow from ortho standpoint - my office will contact patient  tomorrow about setting up surgery for next week  Thank you for the consult and the opportunity to see [name] [name]. Eduard Roux, MD [facility] 5:19 PM

## 2013-12-29 NOTE — Interval H&P Note (Signed)
History and Physical Interval Note:  12/29/2013 2:54 PM  Snyder  has presented today for surgery, with the diagnosis of claudication  The various methods of treatment have been discussed with the patient and family. After consideration of risks, benefits and other options for treatment, the patient has consented to  Procedure(s): LOWER EXTREMITY ANGIOGRAM (N/A) as a surgical intervention .  The patient's history has been reviewed, patient examined, no change in status, stable for surgery.  I have reviewed the patient's chart and labs.  Questions were answered to the patient's satisfaction.     Lorretta Harp

## 2013-12-29 NOTE — CV Procedure (Signed)
Atul Delucia Pilley is a 68 y.o. male    [id number] LOCATION:  FACILITY: [facility]  PHYSICIAN: Quay Burow, M.D. 09-23-1945   DATE OF PROCEDURE:  12/29/2013  DATE OF DISCHARGE:     PV Angiogram/Intervention    History obtained from chart review.Mr. Kuang is a 68 year old married African American male referred by Dr. Judene Companion from the [facility] wound care center for evaluation of critical limb ischemia. Dr. Jolyn Nap is his cardiologist in heart care. He has a history of nonischemic cardiomyopathy status post ICD implantation for primary prevention. His other problems include hypertension, insulin-dependent diabetes, chronic atrial fibrillation. He does not smoke. He denies chest pain or shortness of breath. He just saw Dr. Caryl Comes in the office one month ago. He developed an Left great toe ulcer back in August and had arterial Doppler studies in our office suggesting widely patent arteries. Over last 2 weeks he developed left great toe nonhealing ulcer with what appears to be some dry gangrenous changes on the right great toe. His right heel ulcer has healed but he still has a reactive left and also with a wound VAC. I obtained arterial Dopplers on him 09/01/13 showed a left ABI of 0.44 with occluded posterior tibial and peroneal arteries. I suspect that he does not have an intact pedal arch and probably will not heal his right great toe ulcer without improvement in Pedal blood flow. As a result I have scheduled him to undergo angiography and potential intervention for limb salvage. He did undergo left below the knee amputation by Dr. Erlinda Hong on 11/09/13.    PROCEDURE DESCRIPTION:   The patient was brought to the second floor [facility] cath lab in the postabsorptive state. He was premedicated with Valium 5 mg by mouth, IV Versed and fentanyl. His left groinwas prepped and shaved in usual sterile fashion. Xylocaine 1% was used for local anesthesia. A 5 French sheath was inserted into the  left common femoral artery using standard Seldinger technique. A 5 French crossover catheter was used and contralateral access along with a 0.35 cm Versicore wire. I then advanced a 7 French/55 cm long Ansell sheath across the iliac bifurcation into the distal right common femoral artery. The patient received a dose units of heparin with an ACT of 205. Total contrast administered was 114 cc. Following this I performed angiography of the right lower extremity using posthaste digital subtraction settable technique. I then got a 0.14 cm Rigali wire down the anterior tibial through a 018Quick  cross catheter. I then exchanged for a 0.14 mm viper wire and performed diamondback orbital rotational atherectomy on the anterior tibial artery with a 1.25 mm mini crown up to 120,000 rpm's. I then performed angioplasty with a 3 mm x 120 mm long chocolate balloon. Following this I redirected my wire down the posterior tibial and performed a prolonged inflation with a 2 mm x 200 mm long aspect over the wire balloon the balloon over the Rigali wire. The final result with reduction of long tandem 90% proximal right anterior tibial stenoses to less than 40% with a widely patent posterior tibial. Lateral and a gram of the foot revealed excellent flow in the posterior and anterior tibial with a patent dorsal pedal arch.   HEMODYNAMICS:    AO SYSTOLIC/AO DIASTOLIC: 563/89    Final Impression: successful diamondback orbital rotational atherectomy, chocolate balloon angioplasty of the right anterior tibial artery as angioplasty of the right posterior tibial artery for critical limb ischemia.the sheath was  then withdrawn across the bifurcation and exchanged over a 0.35 wire for a short 7 [place] sheath. The patient left the lab in stable condition. The sheath will be removed once the ACT falls below 170 and pressure will be held on the groin to achieve hemostasis. I spoke with Dr. Eduard Roux, [facility] orthopedics, who will see the  patient this evening and decided that optimal timing of amputation in light of the fact that the patient is on aspirin and Plavix.    Lorretta Harp. MD, [facility] 12/29/2013 4:49 PM

## 2013-12-29 NOTE — H&P (View-Only) (Signed)
12/27/2013 [name] [name]   1946/04/19  [id number]  Primary Physician [name], MD Primary Cardiologist: Lorretta Harp MD Renae Gloss   HPI:  [name] [name] is a 68 year old married African American male referred by Dr. Judene Companion from the [facility] wound care center for evaluation of critical limb ischemia. Dr. Jolyn Nap is his cardiologist in heart care. He has a history of nonischemic cardiomyopathy status post ICD implantation for primary prevention. His other problems include hypertension, insulin-dependent diabetes, chronic atrial fibrillation. He does not smoke. He denies chest pain or shortness of breath. He just saw Dr. Caryl Comes in the office one month ago. He developed an Left great toe ulcer back in August and had arterial Doppler studies in our office suggesting widely patent arteries. Over last 2 weeks he developed left great toe nonhealing ulcer with what appears to be some dry gangrenous changes on the right great toe. His right heel ulcer has healed but he still has a reactive left and also with a wound VAC. I obtained arterial Dopplers on him 09/01/13 showed a left ABI of 0.44 with occluded posterior tibial and peroneal arteries. I suspect that he does not have an intact pedal arch and probably will not heal his right great toe ulcer without improvement in Pedal blood flow. As a result I have scheduled him to undergo angiography and potential intervention for limb salvage. He did undergo left in the patient by Dr. Erlinda Hong on 11/09/13.    Current Outpatient Prescriptions  Medication Sig Dispense Refill  . aspirin 81 MG tablet Take 81 mg by mouth daily.      [name] atorvastatin (LIPITOR) 10 MG tablet Take 10 mg by mouth daily.        . carvedilol (COREG) 12.5 MG tablet Take 12.5 mg by mouth 2 (two) times daily with a meal.      . clopidogrel (PLAVIX) 75 MG tablet Take 1 tablet (75 mg total) by mouth daily with breakfast.  30 tablet  6  . esomeprazole (NEXIUM) 40  MG capsule Take 40 mg by mouth daily as needed (acid reflux).       . furosemide (LASIX) 40 MG tablet Take 1 tablet (40 mg total) by mouth 2 (two) times daily.  60 tablet  1  . insulin detemir (LEVEMIR) 100 UNIT/ML injection Inject 0.35 mLs (35 Units total) into the skin at bedtime.  10 mL  11  . iron polysaccharides (NIFEREX) 150 MG capsule Take 1 capsule (150 mg total) by mouth daily.  30 capsule  1  . metFORMIN (GLUCOPHAGE) 500 MG tablet Take 1 tablet (500 mg total) by mouth 2 (two) times daily.  60 tablet    . Multiple Vitamins-Minerals (MULTIVITAMIN PO) Take 1 tablet by mouth daily.      [name] NOVOLOG 100 UNIT/ML injection Inject 4 Units into the skin as needed for high blood sugar.       . potassium chloride SA (K-DUR,KLOR-CON) 20 MEQ tablet Take 1 tablet (20 mEq total) by mouth 2 (two) times daily.  60 tablet  1  . pregabalin (LYRICA) 75 MG capsule Take 1 capsule (75 mg total) by mouth 2 (two) times daily.  60 capsule  1  . senna-docusate (SENOKOT-S) 8.6-50 MG per tablet Take 1 tablet by mouth 2 (two) times daily.  60 tablet  1  . silver sulfADIAZINE (SILVADENE) 1 % cream Apply 1 application topically daily.       . vitamin E (VITAMIN E) 1000 UNIT  capsule Take 1,000 Units by mouth daily.      [name] spironolactone (ALDACTONE) 25 MG tablet Take 12.5 mg by mouth daily.       No current facility-administered medications for this visit.    No Known Allergies  History   Social History  . Marital Status: Married    Spouse Name: N/A    Number of Children: N/A  . Years of Education: N/A   Occupational History  . Not on file.   Social History Main Topics  . Smoking status: Never Smoker   . Smokeless tobacco: Never Used  . Alcohol Use: No  . Drug Use: No  . Sexual Activity: Yes   Other Topics Concern  . Not on file   Social History Narrative  . No narrative on file     Review of Systems: General: negative for chills, fever, night sweats or weight changes.  Cardiovascular: negative  for chest pain, dyspnea on exertion, edema, orthopnea, palpitations, paroxysmal nocturnal dyspnea or shortness of breath Dermatological: negative for rash Respiratory: negative for cough or wheezing Urologic: negative for hematuria Abdominal: negative for nausea, vomiting, diarrhea, bright red blood per rectum, melena, or hematemesis Neurologic: negative for visual changes, syncope, or dizziness All other systems reviewed and are otherwise negative except as noted above.    Blood pressure 98/56, pulse 88, height 5\' 9"  (1.753 m), weight 175 lb (79.379 kg).  General appearance: alert and no distress Neck: no adenopathy, no carotid bruit, no JVD, supple, symmetrical, trachea midline and thyroid not enlarged, symmetric, no tenderness/mass/nodules Lungs: clear to auscultation bilaterally Heart: regular rate and rhythm, S1, S2 normal, no murmur, click, rub or gallop Extremities: status post left BKA, ischemic ulcers on right first second and third pedis  EKG not performed today  ASSESSMENT AND PLAN:   Critical lower limb ischemia [name] has critical limb ischemia. I performed diamondback orbital rotational atherectomy and angioplasty of his left anterior tibial artery back 09/12/13. He'll underwent left below-the-knee amputation by Dr. Erlinda Hong on 11/09/13. He was referred back today by Dr. Judene Companion at the wound care center for critical limb ischemia involving his right first second and third toes. At angiography back in January he had diffusely diseased right anterior and posterior tibial arteries. His peroneal was intact. I do not think he would heal a transmetatarsal or toe amputation without restoring flow to his pedal arch . I am going to arrange to perform right anterior and posterior tibial artery intervention this coming Monday for limb salvage.      Lorretta Harp MD FACP,FACC,FAHA, [facility] 12/27/2013 8:35 AM

## 2013-12-30 ENCOUNTER — Telehealth (HOSPITAL_COMMUNITY): Payer: Self-pay | Admitting: *Deleted

## 2013-12-30 ENCOUNTER — Other Ambulatory Visit (HOSPITAL_COMMUNITY): Payer: Self-pay | Admitting: Orthopaedic Surgery

## 2013-12-30 DIAGNOSIS — I10 Essential (primary) hypertension: Secondary | ICD-10-CM

## 2013-12-30 DIAGNOSIS — I999 Unspecified disorder of circulatory system: Secondary | ICD-10-CM

## 2013-12-30 DIAGNOSIS — I428 Other cardiomyopathies: Secondary | ICD-10-CM

## 2013-12-30 LAB — CBC
HEMATOCRIT: 32.5 % — AB (ref 39.0–52.0)
HEMOGLOBIN: 10.4 g/dL — AB (ref 13.0–17.0)
MCH: 23.9 pg — AB (ref 26.0–34.0)
MCHC: 32 g/dL (ref 30.0–36.0)
MCV: 74.5 fL — AB (ref 78.0–100.0)
Platelets: 230 10*3/uL (ref 150–400)
RBC: 4.36 MIL/uL (ref 4.22–5.81)
RDW: 19.3 % — ABNORMAL HIGH (ref 11.5–15.5)
WBC: 5.1 10*3/uL (ref 4.0–10.5)

## 2013-12-30 LAB — BASIC METABOLIC PANEL
BUN: 14 mg/dL (ref 6–23)
CALCIUM: 8.7 mg/dL (ref 8.4–10.5)
CO2: 20 meq/L (ref 19–32)
Chloride: 104 mEq/L (ref 96–112)
Creatinine, Ser: 0.91 mg/dL (ref 0.50–1.35)
GFR calc Af Amer: >90 mL/min (ref >90)
GFR calc non Af Amer: 84 mL/min — ABNORMAL LOW (ref >90)
GLUCOSE: 224 mg/dL — AB (ref 70–99)
Potassium: 4.6 mEq/L (ref 3.7–5.3)
SODIUM: 141 meq/L (ref 137–147)

## 2013-12-30 LAB — GLUCOSE, CAPILLARY
Glucose-Capillary: 137 mg/dL — ABNORMAL HIGH (ref 70–99)
Glucose-Capillary: 169 mg/dL — ABNORMAL HIGH (ref 70–99)

## 2013-12-30 MED ORDER — LIVING WELL WITH DIABETES BOOK
Freq: Once | Status: AC
  Administered 2013-12-30: 05:00:00
  Filled 2013-12-30: qty 1

## 2013-12-30 MED ORDER — ASPIRIN EC 81 MG PO TBEC: 81.0000 mg | DELAYED_RELEASE_TABLET | Freq: Every day | ORAL | Status: DC

## 2013-12-30 MED ORDER — METFORMIN HCL 500 MG PO TABS: 500.0000 mg | ORAL_TABLET | Freq: Two times a day (BID) | ORAL | Status: DC

## 2013-12-30 NOTE — Progress Notes (Signed)
    Subjective:  No complaints  Objective:  Vital Signs in the last 24 hours: Temp:  [98.3 F (36.8 C)-98.9 F (37.2 C)] 98.3 F (36.8 C) (05/08 0431) Pulse Rate:  [73-97] 87 (05/08 0431) Resp:  [17-20] 17 (05/08 0431) BP: (99-121)/(54-82) 108/59 mmHg (05/08 0431) SpO2:  [95 %-100 %] 100 % (05/08 0431) Weight:  [173 lb (78.472 kg)-177 lb 14.6 oz (80.7 kg)] 177 lb 14.6 oz (80.7 kg) (05/08 0017)  Intake/Output from previous day:  Intake/Output Summary (Last 24 hours) at 12/30/13 0727 Last data filed at 12/30/13 0432  Gross per 24 hour  Intake    110 ml  Output    600 ml  Net   -490 ml    Physical Exam: General appearance: alert, cooperative and no distress Lungs: clear to auscultation bilaterally Heart: regular rate and rhythm Extremities: Lt groin without hematoma   Rate: 78  Rhythm: normal sinus rhythm, paced  Lab Results:  Recent Labs  12/27/13 0920 12/30/13 0443  WBC 5.9 5.1  HGB 11.2* 10.4*  PLT 296 230    Recent Labs  12/27/13 0920 12/30/13 0443  NA 143 141  K 4.6 4.6  CL 106 104  CO2 27 20  GLUCOSE 91 224*  BUN 20 14  CREATININE 0.98 0.91   No results found for this basename: TROPONINI, CK, MB,  in the last 72 hours  Recent Labs  12/27/13 0920  INR 1.02    Imaging: Imaging results have been reviewed  Cardiac Studies:  Assessment/Plan:  [name] [name] is a 68 year old married African American male referred by Dr. Judene Companion from the [facility] wound care center for evaluation of critical limb ischemia. Dr. Jolyn Nap is his cardiologist. He has a history of NICM status post ICD implantation for primary prevention. His other problems include hypertension, insulin-dependent diabetes, chronic atrial fibrillation. He does not smoke. He is s/p Lt BKA March 2015.  He was admitted 12/29/13 for angiogram because of critical ischemia in his Rt leg. He underwent Rt anterior tibial PTA in preparation for Rt BKA next week.     Principal Problem:  Critical lower limb ischemia- s/p Rt anterior tibial PTA 12/29/13 in preparation for Rt BKA Active Problems:   PAD (peripheral artery disease)   NICM- EF 20-25% echo 6/14   Diabetes mellitus   LBBB   HTN (hypertension)   BiV ICD (BS).  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 Hold Glucophage 48 hrs.   [name] Ransom PA-C [facility] [phone] 12/30/2013, 7:27 AM   I have examined the patient and reviewed assessment and plan and discussed with patient.  Agree with above as stated.    [name] [name]

## 2013-12-30 NOTE — Discharge Summary (Signed)
Patient ID: [name] [name],  MRN: [id number], DOB/AGE: 10/23/1945 68 y.o.  Admit date: 12/29/2013 Discharge date: 12/30/2013  Primary Care Provider: [name], MD Primary Cardiologist: Dr Adora Fridge  Discharge Diagnoses Principal Problem:   Critical lower limb ischemia- s/p Rt anterior tibial PTA 12/29/13 in preparation for Rt BKA Active Problems:   PAD (peripheral artery disease)   NICM- EF 20-25% echo 6/14   Diabetes mellitus   LBBB   HTN (hypertension)   BiV ICD (BS).  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 Sanagustin is a 68 year old married African American male referred by Dr. Judene Companion from the [facility] to Dr Gwenlyn Found for evaluation of critical limb ischemia. Dr. Jolyn Nap is his cardiologist. He has a history of NICM status post ICD implantation for primary prevention. His other problems include hypertension, insulin-dependent diabetes, and chronic atrial fibrillation. He does not smoke. He is s/p Lt BKA March 2015. He was admitted 12/29/13 for angiogram because of critical ischemia in his Rt leg. He underwent successful Rt anterior tibial PTA in preparation for Rt BKA next week. His Plavix is on hold pending his surgery.     Discharge Vitals:  Blood pressure 98/50, pulse 82, temperature 98 F (36.7 C), temperature source Oral, resp. rate 16, height $RemoveBe'5\' 9"'hAsOFJhfv$  (1.753 m), weight 177 lb 14.6 oz (80.7 kg), SpO2 99.00%.    Labs: Results for orders placed during the hospital encounter of 12/29/13 (from the past 48 hour(s))  GLUCOSE, CAPILLARY     Status: None   Collection Time    12/29/13 11:44 AM      Result Value Ref Range   Glucose-Capillary 93  70 - 99 mg/dL  POCT ACTIVATED CLOTTING TIME     Status: None   Collection Time    12/29/13   3:22 PM      Result Value Ref Range   Activated Clotting Time 199    POCT ACTIVATED CLOTTING TIME     Status: None   Collection Time    12/29/13  3:36 PM      Result Value Ref Range   Activated Clotting Time 254    POCT ACTIVATED CLOTTING TIME     Status: None   Collection Time    12/29/13  4:18 PM      Result Value Ref Range   Activated Clotting Time 205    GLUCOSE, CAPILLARY     Status: None   Collection Time    12/29/13  4:57 PM      Result Value Ref Range   Glucose-Capillary 72  70 - 99 mg/dL  POCT ACTIVATED CLOTTING TIME     Status: None   Collection Time    12/29/13  6:00 PM      Result Value Ref Range   Activated Clotting Time 166    GLUCOSE, CAPILLARY     Status: Abnormal   Collection Time    12/29/13  7:37 PM      Result Value Ref Range   Glucose-Capillary 157 (*) 70 - 99 mg/dL  GLUCOSE, CAPILLARY     Status: Abnormal   Collection Time    12/29/13  9:34 PM      Result Value Ref Range   Glucose-Capillary 225 (*) 70 -  99 mg/dL   Comment 1 Notify RN    CBC     Status: Abnormal   Collection Time    12/30/13  4:43 AM      Result Value Ref Range   WBC 5.1  4.0 - 10.5 K/uL   RBC 4.36  4.22 - 5.81 MIL/uL   Hemoglobin 10.4 (*) 13.0 - 17.0 g/dL   HCT 01.2 (*) 58.9 - 09.1 %   MCV 74.5 (*) 78.0 - 100.0 fL   MCH 23.9 (*) 26.0 - 34.0 pg   MCHC 32.0  30.0 - 36.0 g/dL   RDW 27.1 (*) 04.1 - 41.2 %   Platelets 230  150 - 400 K/uL  BASIC METABOLIC PANEL     Status: Abnormal   Collection Time    12/30/13  4:43 AM      Result Value Ref Range   Sodium 141  137 - 147 mEq/L   Potassium 4.6  3.7 - 5.3 mEq/L   Chloride 104  96 - 112 mEq/L   CO2 20  19 - 32 mEq/L   Glucose, Bld 224 (*) 70 - 99 mg/dL   BUN 14  6 - 23 mg/dL   Creatinine, Ser 0.55  0.50 - 1.35 mg/dL   Calcium 8.7  8.4 - 87.9 mg/dL   GFR calc non Af Amer 84 (*) >90 mL/min   GFR calc Af Amer >90  >90 mL/min   Comment: (NOTE)     The eGFR has been calculated using the CKD EPI equation.     This calculation has  not been validated in all clinical situations.     eGFR's persistently <90 mL/min signify possible Chronic Kidney     Disease.  GLUCOSE, CAPILLARY     Status: Abnormal   Collection Time    12/30/13  8:19 AM      Result Value Ref Range   Glucose-Capillary 137 (*) 70 - 99 mg/dL   Comment 1 Notify RN      Disposition:  Follow-up Information   Follow up with Cheral Almas, MD. (office will call you about surgery)    Specialty:  Orthopedic Surgery   Contact information:   [address] [place] [address] [phone]       Discharge Medications:    Medication List    STOP taking these medications       clopidogrel 75 MG tablet  Commonly known as:  PLAVIX      TAKE these medications       aspirin EC 81 MG tablet  Take 1 tablet (81 mg total) by mouth daily.     atorvastatin 10 MG tablet  Commonly known as:  LIPITOR  Take 10 mg by mouth daily.     carvedilol 12.5 MG tablet  Commonly known as:  COREG  Take 12.5 mg by mouth 2 (two) times daily with a meal.     esomeprazole 40 MG capsule  Commonly known as:  NEXIUM  Take 40 mg by mouth daily as needed (acid reflux).     furosemide 40 MG tablet  Commonly known as:  LASIX  Take 1 tablet (40 mg total) by mouth 2 (two) times daily.     insulin detemir 100 UNIT/ML injection  Commonly known as:  LEVEMIR  Inject 0.35 mLs (35 Units total) into the skin at bedtime.     iron polysaccharides 150 MG capsule  Commonly known as:  NIFEREX  Take 1 capsule (150 mg total) by mouth daily.  metFORMIN 500 MG tablet  Commonly known as:  GLUCOPHAGE  Take 1 tablet (500 mg total) by mouth 2 (two) times daily.  Start taking on:  01/01/2014     MULTIVITAMIN PO  Take 1 tablet by mouth daily.     NOVOLOG 100 UNIT/ML injection  Generic drug:  insulin aspart  Inject 4 Units into the skin daily as needed for high blood sugar (if cbg is 145 or greater).     potassium chloride SA 20 MEQ tablet  Commonly known as:   K-DUR,KLOR-CON  Take 1 tablet (20 mEq total) by mouth 2 (two) times daily.     pregabalin 75 MG capsule  Commonly known as:  LYRICA  Take 1 capsule (75 mg total) by mouth 2 (two) times daily.     senna-docusate 8.6-50 MG per tablet  Commonly known as:  Senokot-S  Take 1 tablet by mouth 2 (two) times daily.     silver sulfADIAZINE 1 % cream  Commonly known as:  SILVADENE  Apply 1 application topically daily.     spironolactone 25 MG tablet  Commonly known as:  ALDACTONE  Take 12.5 mg by mouth daily.     vitamin E 1000 UNIT capsule  Generic drug:  vitamin E  Take 1,000 Units by mouth daily.         Duration of Discharge Encounter: Greater than 30 minutes including physician time.  Signed, Kerin Ransom PA-C 12/30/2013 9:16 AM  I have examined the patient and reviewed assessment and plan and discussed with patient.  Agree with above as stated.  Successful PV provedure.  Palpable femoral pulse at access site.    Jettie Booze

## 2013-12-30 NOTE — Progress Notes (Signed)
[facility]  Patient Status: Active (receiving services up to time of hospitalization)  [facility] is providing the following services: RN  If patient discharges after hours, please call [phone].   [name] [name] 12/30/2013, 10:32 AM

## 2014-01-02 ENCOUNTER — Inpatient Hospital Stay: Payer: Medicare Other | Admitting: Physical Medicine & Rehabilitation

## 2014-01-02 ENCOUNTER — Encounter: Payer: Self-pay | Admitting: *Deleted

## 2014-01-03 ENCOUNTER — Ambulatory Visit (HOSPITAL_BASED_OUTPATIENT_CLINIC_OR_DEPARTMENT_OTHER)
Admission: RE | Admit: 2014-01-03 | Discharge: 2014-01-03 | Disposition: A | Discharge status: Home / Self Care | Payer: Medicare Other | Source: Ambulatory Visit | Attending: Cardiovascular Disease | Admitting: Cardiovascular Disease

## 2014-01-03 ENCOUNTER — Encounter (HOSPITAL_COMMUNITY): Payer: Self-pay | Admitting: *Deleted

## 2014-01-03 DIAGNOSIS — L97509 Non-pressure chronic ulcer of other part of unspecified foot with unspecified severity: Secondary | ICD-10-CM | POA: Diagnosis present

## 2014-01-03 DIAGNOSIS — D62 Acute posthemorrhagic anemia: Secondary | ICD-10-CM | POA: Diagnosis not present

## 2014-01-03 DIAGNOSIS — I428 Other cardiomyopathies: Secondary | ICD-10-CM | POA: Diagnosis present

## 2014-01-03 DIAGNOSIS — K219 Gastro-esophageal reflux disease without esophagitis: Secondary | ICD-10-CM | POA: Diagnosis present

## 2014-01-03 DIAGNOSIS — E7800 Pure hypercholesterolemia, unspecified: Secondary | ICD-10-CM | POA: Diagnosis present

## 2014-01-03 DIAGNOSIS — I739 Peripheral vascular disease, unspecified: Secondary | ICD-10-CM | POA: Diagnosis present

## 2014-01-03 DIAGNOSIS — F0390 Unspecified dementia without behavioral disturbance: Secondary | ICD-10-CM | POA: Diagnosis present

## 2014-01-03 DIAGNOSIS — I10 Essential (primary) hypertension: Secondary | ICD-10-CM | POA: Diagnosis present

## 2014-01-03 DIAGNOSIS — I2510 Atherosclerotic heart disease of native coronary artery without angina pectoris: Secondary | ICD-10-CM | POA: Diagnosis present

## 2014-01-03 DIAGNOSIS — L98499 Non-pressure chronic ulcer of skin of other sites with unspecified severity: Secondary | ICD-10-CM | POA: Diagnosis present

## 2014-01-03 DIAGNOSIS — E1169 Type 2 diabetes mellitus with other specified complication: Principal | ICD-10-CM | POA: Diagnosis present

## 2014-01-03 DIAGNOSIS — I509 Heart failure, unspecified: Secondary | ICD-10-CM | POA: Diagnosis present

## 2014-01-03 DIAGNOSIS — I5022 Chronic systolic (congestive) heart failure: Secondary | ICD-10-CM | POA: Diagnosis present

## 2014-01-03 DIAGNOSIS — M9080 Osteopathy in diseases classified elsewhere, unspecified site: Secondary | ICD-10-CM | POA: Diagnosis present

## 2014-01-03 DIAGNOSIS — M86679 Other chronic osteomyelitis, unspecified ankle and foot: Secondary | ICD-10-CM | POA: Diagnosis present

## 2014-01-03 DIAGNOSIS — I4891 Unspecified atrial fibrillation: Secondary | ICD-10-CM | POA: Diagnosis present

## 2014-01-03 DIAGNOSIS — Z95810 Presence of automatic (implantable) cardiac defibrillator: Secondary | ICD-10-CM

## 2014-01-03 MED ORDER — CEFAZOLIN SODIUM-DEXTROSE 2-3 GM-% IV SOLR
2.0000 g | INTRAVENOUS | Status: AC
  Administered 2014-01-04: 2 g via INTRAVENOUS
  Filled 2014-01-03: qty 50

## 2014-01-03 NOTE — Progress Notes (Signed)
Right Lower Extremity Arterial Duplex Completed. °Brianna L Mazza,RVT °

## 2014-01-04 ENCOUNTER — Inpatient Hospital Stay (HOSPITAL_COMMUNITY): Payer: Medicare Other | Admitting: Anesthesiology

## 2014-01-04 ENCOUNTER — Inpatient Hospital Stay (HOSPITAL_COMMUNITY)
Admission: RE | Admit: 2014-01-04 | Discharge: 2014-01-11 | DRG: 617 | Disposition: A | Discharge status: Home Health | Payer: Medicare Other | Source: Ambulatory Visit | Attending: Orthopaedic Surgery | Admitting: Orthopaedic Surgery

## 2014-01-04 ENCOUNTER — Encounter (HOSPITAL_COMMUNITY): Payer: Self-pay | Admitting: *Deleted

## 2014-01-04 ENCOUNTER — Encounter (HOSPITAL_COMMUNITY)
Admission: RE | Disposition: A | Discharge status: Home Health | Payer: Self-pay | Source: Ambulatory Visit | Attending: Orthopaedic Surgery

## 2014-01-04 ENCOUNTER — Encounter (HOSPITAL_COMMUNITY): Payer: Medicare Other | Admitting: Anesthesiology

## 2014-01-04 DIAGNOSIS — M86671 Other chronic osteomyelitis, right ankle and foot: Secondary | ICD-10-CM | POA: Diagnosis present

## 2014-01-04 DIAGNOSIS — M869 Osteomyelitis, unspecified: Secondary | ICD-10-CM | POA: Diagnosis present

## 2014-01-04 HISTORY — PX: AMPUTATION: SHX166

## 2014-01-04 HISTORY — DX: Personal history of other medical treatment: Z92.89

## 2014-01-04 LAB — GLUCOSE, CAPILLARY
GLUCOSE-CAPILLARY: 132 mg/dL — AB (ref 70–99)
GLUCOSE-CAPILLARY: 124 mg/dL — AB (ref 70–99)
GLUCOSE-CAPILLARY: 209 mg/dL — AB (ref 70–99)
Glucose-Capillary: 122 mg/dL — ABNORMAL HIGH (ref 70–99)
Glucose-Capillary: 130 mg/dL — ABNORMAL HIGH (ref 70–99)

## 2014-01-04 SURGERY — AMPUTATION, FOOT, PARTIAL
Anesthesia: General | Site: Toe | Laterality: Right

## 2014-01-04 MED ORDER — CARVEDILOL 12.5 MG PO TABS
12.5000 mg | ORAL_TABLET | Freq: Two times a day (BID) | ORAL | Status: DC
  Administered 2014-01-04 – 2014-01-11 (×15): 12.5 mg via ORAL
  Filled 2014-01-04 (×17): qty 1

## 2014-01-04 MED ORDER — LACTATED RINGERS IV SOLN
INTRAVENOUS | Status: DC
  Administered 2014-01-04: 12:00:00 via INTRAVENOUS

## 2014-01-04 MED ORDER — FENTANYL CITRATE 0.05 MG/ML IJ SOLN
INTRAMUSCULAR | Status: DC | PRN
  Administered 2014-01-04 (×3): 50 ug via INTRAVENOUS

## 2014-01-04 MED ORDER — MORPHINE SULFATE 2 MG/ML IJ SOLN
1.0000 mg | INTRAMUSCULAR | Status: DC | PRN
  Administered 2014-01-04: 1 mg via INTRAVENOUS
  Filled 2014-01-04: qty 1

## 2014-01-04 MED ORDER — CARVEDILOL 12.5 MG PO TABS: 12.5000 mg | ORAL_TABLET | Freq: Two times a day (BID) | ORAL | Status: DC

## 2014-01-04 MED ORDER — ATORVASTATIN CALCIUM 10 MG PO TABS
10.0000 mg | ORAL_TABLET | Freq: Every day | ORAL | Status: DC
  Administered 2014-01-04 – 2014-01-11 (×8): 10 mg via ORAL
  Filled 2014-01-04 (×8): qty 1

## 2014-01-04 MED ORDER — CEFAZOLIN SODIUM-DEXTROSE 2-3 GM-% IV SOLR
2.0000 g | Freq: Four times a day (QID) | INTRAVENOUS | Status: AC
  Administered 2014-01-04 – 2014-01-05 (×3): 2 g via INTRAVENOUS
  Filled 2014-01-04 (×3): qty 50

## 2014-01-04 MED ORDER — OXYCODONE HCL 5 MG/5ML PO SOLN: 5.0000 mg | Freq: Once | ORAL | Status: DC | PRN

## 2014-01-04 MED ORDER — SODIUM CHLORIDE 0.9 % IV SOLN
INTRAVENOUS | Status: DC
  Administered 2014-01-04: 125 mL/h via INTRAVENOUS

## 2014-01-04 MED ORDER — LIDOCAINE HCL (CARDIAC) 20 MG/ML IV SOLN
INTRAVENOUS | Status: DC | PRN
  Administered 2014-01-04: 40 mg via INTRAVENOUS

## 2014-01-04 MED ORDER — PHENYLEPHRINE 40 MCG/ML (10ML) SYRINGE FOR IV PUSH (FOR BLOOD PRESSURE SUPPORT)
PREFILLED_SYRINGE | INTRAVENOUS | Status: AC
  Filled 2014-01-04: qty 20

## 2014-01-04 MED ORDER — INSULIN DETEMIR 100 UNIT/ML ~~LOC~~ SOLN
35.0000 [IU] | Freq: Every day | SUBCUTANEOUS | Status: DC
  Administered 2014-01-04 – 2014-01-10 (×7): 35 [IU] via SUBCUTANEOUS
  Filled 2014-01-04 (×8): qty 0.35

## 2014-01-04 MED ORDER — MAGNESIUM HYDROXIDE 400 MG/5ML PO SUSP: 30.0000 mL | Freq: Every day | ORAL | Status: DC | PRN

## 2014-01-04 MED ORDER — POTASSIUM CHLORIDE CRYS ER 20 MEQ PO TBCR
20.0000 meq | EXTENDED_RELEASE_TABLET | Freq: Two times a day (BID) | ORAL | Status: DC
  Administered 2014-01-04 – 2014-01-11 (×14): 20 meq via ORAL
  Filled 2014-01-04 (×15): qty 1

## 2014-01-04 MED ORDER — OXYCODONE HCL 5 MG PO TABS: 5.0000 mg | ORAL_TABLET | Freq: Once | ORAL | Status: DC | PRN

## 2014-01-04 MED ORDER — SODIUM CHLORIDE 0.9 % IR SOLN
Status: DC | PRN
  Administered 2014-01-04: 3000 mL

## 2014-01-04 MED ORDER — ASPIRIN EC 325 MG PO TBEC
325.0000 mg | DELAYED_RELEASE_TABLET | Freq: Every day | ORAL | Status: DC
  Administered 2014-01-04 – 2014-01-05 (×2): 325 mg via ORAL
  Filled 2014-01-04 (×2): qty 1

## 2014-01-04 MED ORDER — SENNA 8.6 MG PO TABS: 1.0000 | ORAL_TABLET | Freq: Two times a day (BID) | ORAL | Status: DC

## 2014-01-04 MED ORDER — METHOCARBAMOL 1000 MG/10ML IJ SOLN: 500.0000 mg | Freq: Four times a day (QID) | INTRAVENOUS | Status: DC | PRN

## 2014-01-04 MED ORDER — PHENYLEPHRINE HCL 10 MG/ML IJ SOLN
INTRAMUSCULAR | Status: DC | PRN
  Administered 2014-01-04: 80 ug via INTRAVENOUS
  Administered 2014-01-04: 120 ug via INTRAVENOUS
  Administered 2014-01-04 (×2): 80 ug via INTRAVENOUS
  Administered 2014-01-04: 800 ug via INTRAVENOUS
  Administered 2014-01-04 (×2): 80 ug via INTRAVENOUS

## 2014-01-04 MED ORDER — PREGABALIN 75 MG PO CAPS
75.0000 mg | ORAL_CAPSULE | Freq: Two times a day (BID) | ORAL | Status: DC
  Administered 2014-01-04 – 2014-01-11 (×14): 75 mg via ORAL
  Filled 2014-01-04 (×14): qty 1

## 2014-01-04 MED ORDER — HYDROMORPHONE HCL PF 1 MG/ML IJ SOLN: 0.2500 mg | INTRAMUSCULAR | Status: DC | PRN

## 2014-01-04 MED ORDER — PANTOPRAZOLE SODIUM 40 MG PO TBEC
40.0000 mg | DELAYED_RELEASE_TABLET | Freq: Every day | ORAL | Status: DC
  Administered 2014-01-04 – 2014-01-11 (×8): 40 mg via ORAL
  Filled 2014-01-04 (×8): qty 1

## 2014-01-04 MED ORDER — MAGNESIUM CITRATE PO SOLN: 1.0000 | Freq: Once | ORAL | Status: AC | PRN

## 2014-01-04 MED ORDER — ARTIFICIAL TEARS OP OINT
TOPICAL_OINTMENT | OPHTHALMIC | Status: DC | PRN
  Administered 2014-01-04: 1 via OPHTHALMIC

## 2014-01-04 MED ORDER — INSULIN ASPART 100 UNIT/ML ~~LOC~~ SOLN
4.0000 [IU] | Freq: Every day | SUBCUTANEOUS | Status: DC | PRN
  Administered 2014-01-08 – 2014-01-10 (×3): 4 [IU] via SUBCUTANEOUS

## 2014-01-04 MED ORDER — METHOCARBAMOL 500 MG PO TABS
500.0000 mg | ORAL_TABLET | Freq: Four times a day (QID) | ORAL | Status: DC | PRN
  Administered 2014-01-04 – 2014-01-05 (×3): 500 mg via ORAL
  Filled 2014-01-04 (×3): qty 1

## 2014-01-04 MED ORDER — FUROSEMIDE 40 MG PO TABS
40.0000 mg | ORAL_TABLET | Freq: Two times a day (BID) | ORAL | Status: DC
  Administered 2014-01-04 – 2014-01-11 (×13): 40 mg via ORAL
  Filled 2014-01-04 (×17): qty 1

## 2014-01-04 MED ORDER — CARVEDILOL 12.5 MG PO TABS
ORAL_TABLET | ORAL | Status: AC
  Filled 2014-01-04: qty 1

## 2014-01-04 MED ORDER — SENNOSIDES-DOCUSATE SODIUM 8.6-50 MG PO TABS
1.0000 | ORAL_TABLET | Freq: Two times a day (BID) | ORAL | Status: DC
  Administered 2014-01-04 – 2014-01-11 (×14): 1 via ORAL
  Filled 2014-01-04 (×14): qty 1

## 2014-01-04 MED ORDER — FENTANYL CITRATE 0.05 MG/ML IJ SOLN
INTRAMUSCULAR | Status: AC
  Filled 2014-01-04: qty 5

## 2014-01-04 MED ORDER — INSULIN ASPART 100 UNIT/ML ~~LOC~~ SOLN
0.0000 [IU] | Freq: Three times a day (TID) | SUBCUTANEOUS | Status: DC
  Administered 2014-01-05: 3 [IU] via SUBCUTANEOUS
  Administered 2014-01-05: 5 [IU] via SUBCUTANEOUS
  Administered 2014-01-05: 2 [IU] via SUBCUTANEOUS
  Administered 2014-01-06: 5 [IU] via SUBCUTANEOUS
  Administered 2014-01-06: 2 [IU] via SUBCUTANEOUS
  Administered 2014-01-07 – 2014-01-08 (×2): 8 [IU] via SUBCUTANEOUS
  Administered 2014-01-08: 3 [IU] via SUBCUTANEOUS
  Administered 2014-01-09: 2 [IU] via SUBCUTANEOUS
  Administered 2014-01-09: 3 [IU] via SUBCUTANEOUS
  Administered 2014-01-09 – 2014-01-10 (×3): 5 [IU] via SUBCUTANEOUS
  Administered 2014-01-10 – 2014-01-11 (×2): 3 [IU] via SUBCUTANEOUS
  Administered 2014-01-11: 5 [IU] via SUBCUTANEOUS

## 2014-01-04 MED ORDER — PROPOFOL 10 MG/ML IV BOLUS
INTRAVENOUS | Status: DC | PRN
  Administered 2014-01-04: 150 mg via INTRAVENOUS
  Administered 2014-01-04: 20 mg via INTRAVENOUS

## 2014-01-04 MED ORDER — LIDOCAINE HCL (CARDIAC) 20 MG/ML IV SOLN
INTRAVENOUS | Status: AC
  Filled 2014-01-04: qty 5

## 2014-01-04 MED ORDER — POLYSACCHARIDE IRON COMPLEX 150 MG PO CAPS
150.0000 mg | ORAL_CAPSULE | Freq: Every day | ORAL | Status: DC
  Administered 2014-01-04 – 2014-01-11 (×8): 150 mg via ORAL
  Filled 2014-01-04 (×8): qty 1

## 2014-01-04 MED ORDER — ONDANSETRON HCL 4 MG PO TABS: 4.0000 mg | ORAL_TABLET | Freq: Four times a day (QID) | ORAL | Status: DC | PRN

## 2014-01-04 MED ORDER — ONDANSETRON HCL 4 MG/2ML IJ SOLN
4.0000 mg | Freq: Four times a day (QID) | INTRAMUSCULAR | Status: DC | PRN
Start: 2014-01-04 — End: 2014-01-11

## 2014-01-04 MED ORDER — SPIRONOLACTONE 12.5 MG HALF TABLET
12.5000 mg | ORAL_TABLET | Freq: Every day | ORAL | Status: DC
  Administered 2014-01-04 – 2014-01-11 (×8): 12.5 mg via ORAL
  Filled 2014-01-04 (×8): qty 1

## 2014-01-04 MED ORDER — LACTATED RINGERS IV SOLN
INTRAVENOUS | Status: DC | PRN
  Administered 2014-01-04: 13:00:00 via INTRAVENOUS

## 2014-01-04 MED ORDER — HYDROCODONE-ACETAMINOPHEN 5-325 MG PO TABS
1.0000 | ORAL_TABLET | ORAL | Status: DC | PRN
  Administered 2014-01-04 – 2014-01-10 (×8): 2 via ORAL
  Filled 2014-01-04 (×8): qty 2

## 2014-01-04 MED ORDER — OXYCODONE HCL 5 MG PO TABS
5.0000 mg | ORAL_TABLET | ORAL | Status: DC | PRN
  Administered 2014-01-04 – 2014-01-11 (×6): 10 mg via ORAL
  Filled 2014-01-04 (×6): qty 2

## 2014-01-04 MED ORDER — ONDANSETRON HCL 4 MG/2ML IJ SOLN
INTRAMUSCULAR | Status: AC
  Filled 2014-01-04: qty 2

## 2014-01-04 MED ORDER — METOCLOPRAMIDE HCL 5 MG/ML IJ SOLN: 5.0000 mg | Freq: Three times a day (TID) | INTRAMUSCULAR | Status: DC | PRN

## 2014-01-04 MED ORDER — METOCLOPRAMIDE HCL 5 MG/ML IJ SOLN: 10.0000 mg | Freq: Once | INTRAMUSCULAR | Status: DC | PRN

## 2014-01-04 MED ORDER — ONDANSETRON HCL 4 MG/2ML IJ SOLN
INTRAMUSCULAR | Status: DC | PRN
  Administered 2014-01-04: 4 mg via INTRAVENOUS

## 2014-01-04 MED ORDER — METOCLOPRAMIDE HCL 10 MG PO TABS: 5.0000 mg | ORAL_TABLET | Freq: Three times a day (TID) | ORAL | Status: DC | PRN

## 2014-01-04 MED ORDER — 0.9 % SODIUM CHLORIDE (POUR BTL) OPTIME
TOPICAL | Status: DC | PRN
  Administered 2014-01-04: 1000 mL

## 2014-01-04 MED ORDER — SORBITOL 70 % SOLN
30.0000 mL | Freq: Every day | Status: DC | PRN
  Filled 2014-01-04: qty 30

## 2014-01-04 MED ORDER — DIPHENHYDRAMINE HCL 12.5 MG/5ML PO ELIX
25.0000 mg | ORAL_SOLUTION | ORAL | Status: DC | PRN
  Administered 2014-01-05 (×2): 25 mg via ORAL
  Filled 2014-01-04 (×2): qty 10

## 2014-01-04 MED ORDER — INSULIN ASPART 100 UNIT/ML ~~LOC~~ SOLN
0.0000 [IU] | Freq: Every day | SUBCUTANEOUS | Status: DC
  Administered 2014-01-04 – 2014-01-10 (×5): 2 [IU] via SUBCUTANEOUS

## 2014-01-04 SURGICAL SUPPLY — 49 items
BANDAGE ESMARK 6X9 LF (GAUZE/BANDAGES/DRESSINGS) ×1 IMPLANT
BANDAGE GAUZE ELAST BULKY 4 IN (GAUZE/BANDAGES/DRESSINGS) ×1 IMPLANT
BLADE 10 SAFETY STRL DISP (BLADE) ×3 IMPLANT
BLADE AVERAGE 25MMX9MM (BLADE)
BLADE AVERAGE 25X9 (BLADE) IMPLANT
BLADE SAW RECIP 87.9 MT (BLADE) ×2 IMPLANT
BNDG CMPR 9X6 STRL LF SNTH (GAUZE/BANDAGES/DRESSINGS)
BNDG COHESIVE 4X5 TAN STRL (GAUZE/BANDAGES/DRESSINGS) ×3 IMPLANT
BNDG CONFORM 3 STRL LF (GAUZE/BANDAGES/DRESSINGS) ×4 IMPLANT
BNDG ESMARK 6X9 LF (GAUZE/BANDAGES/DRESSINGS)
CLEANER TIP ELECTROSURG 2X2 (MISCELLANEOUS) ×1 IMPLANT
COVER SURGICAL LIGHT HANDLE (MISCELLANEOUS) ×3 IMPLANT
CUFF TOURNIQUET SINGLE 34IN LL (TOURNIQUET CUFF) ×1 IMPLANT
DRAPE U-SHAPE 47X51 STRL (DRAPES) ×3 IMPLANT
DRSG PAD ABDOMINAL 8X10 ST (GAUZE/BANDAGES/DRESSINGS) ×1 IMPLANT
ELECT CAUTERY BLADE 6.4 (BLADE) ×3 IMPLANT
ELECT REM PT RETURN 9FT ADLT (ELECTROSURGICAL) ×3
ELECTRODE REM PT RTRN 9FT ADLT (ELECTROSURGICAL) ×1 IMPLANT
FACESHIELD WRAPAROUND (MASK) ×3 IMPLANT
FACESHIELD WRAPAROUND OR TEAM (MASK) IMPLANT
GAUZE XEROFORM 5X9 LF (GAUZE/BANDAGES/DRESSINGS) ×3 IMPLANT
GLOVE BIOGEL PI IND STRL 7.0 (GLOVE) IMPLANT
GLOVE BIOGEL PI INDICATOR 7.0 (GLOVE) ×2
GLOVE SURG SS PI 7.0 STRL IVOR (GLOVE) ×2 IMPLANT
GLOVE SURG SS PI 7.5 STRL IVOR (GLOVE) ×6 IMPLANT
GLOVE SURG SS PI 8.0 STRL IVOR (GLOVE) ×2 IMPLANT
GOWN STRL REUS W/ TWL XL LVL3 (GOWN DISPOSABLE) ×1 IMPLANT
GOWN STRL REUS W/TWL XL LVL3 (GOWN DISPOSABLE) ×3
HANDPIECE INTERPULSE COAX TIP (DISPOSABLE) ×3
KIT BASIN OR (CUSTOM PROCEDURE TRAY) ×3 IMPLANT
NS IRRIG 1000ML POUR BTL (IV SOLUTION) ×3 IMPLANT
PACK ORTHO EXTREMITY (CUSTOM PROCEDURE TRAY) ×4 IMPLANT
PAD ARMBOARD 7.5X6 YLW CONV (MISCELLANEOUS) ×3 IMPLANT
PAD CAST 4YDX4 CTTN HI CHSV (CAST SUPPLIES) ×4 IMPLANT
PADDING CAST COTTON 4X4 STRL (CAST SUPPLIES)
SET HNDPC FAN SPRY TIP SCT (DISPOSABLE) IMPLANT
SPONGE GAUZE 4X4 12PLY (GAUZE/BANDAGES/DRESSINGS) ×3 IMPLANT
SPONGE LAP 18X18 X RAY DECT (DISPOSABLE) ×4 IMPLANT
STAPLER VISISTAT 35W (STAPLE) IMPLANT
SUT ETHILON 2 0 PSLX (SUTURE) ×6 IMPLANT
SUT PDS AB 0 CT 36 (SUTURE) ×2 IMPLANT
SUT PDS AB 1 CT  36 (SUTURE) ×2
SUT PDS AB 1 CT 36 (SUTURE) ×1 IMPLANT
SUT VIC AB 2-0 CT1 27 (SUTURE)
SUT VIC AB 2-0 CT1 TAPERPNT 27 (SUTURE) ×1 IMPLANT
TOWEL OR 17X26 10 PK STRL BLUE (TOWEL DISPOSABLE) ×5 IMPLANT
TUBE CONNECTING 12'X1/4 (SUCTIONS) ×1
TUBE CONNECTING 12X1/4 (SUCTIONS) ×1 IMPLANT
YANKAUER SUCT BULB TIP NO VENT (SUCTIONS) ×2 IMPLANT

## 2014-01-04 NOTE — H&P (Addendum)
ORTHOPAEDIC HISTORY AND PHYSICAL   Chief Complaint: Right toe ulcers  HPI: [name] [name] is a 68 y.o. male who complains of right toe ulcers of GT, 2nd, 3rd toes.  Presents today for amputation of those toes.  Denies any changes to medical history.  Plavix was stopped this past Friday.    Past Medical History  Diagnosis Date  . GERD (gastroesophageal reflux disease)   . Obesity   . Cardiomyopathy, nonischemic     a. Cath 2003: mild nonobstructive CAD, EF 25% at that time.  . Hypertension   . Chronic systolic CHF (congestive heart failure)     a. NICM EF 25% dating back to at least 2003.  [name] PAF (paroxysmal atrial fibrillation)     a. Noted on ICD interrogation 2012;  b. coumadin d/c'd 01/2013.  [name] NSVT (nonsustained ventricular tachycardia)     a. Noted on ICD interrogation in 2011.  [name] LBBB (left bundle branch block)   . Critical lower limb ischemia   . High cholesterol   . PAD (peripheral artery disease)     a. 08/2013 Periph Angio/PTA: Abd Ao nl, RLE- 3v runoff, PT diff dzs, AT 90p, LLE 2v runoff, PT 100, AT 56m (diamondback ORA/chocolate balloon PTA).  . Type II diabetes mellitus   . Uncontrolled pain, Lt toe 09/21/2013  . Gangrenous toe, Lt toe 09/21/2013  . CAD (coronary artery disease)     a. Nonobstructive by cath 09/2001.  [name] Automatic implantable cardioverter-defibrillator in situ     a. 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  . Dementia   . Arthritis   . History of blood transfusion    Past Surgical History  Procedure Laterality Date  . Lithotripsy  2001  . Cervical spine surgery  1994  . Cardiac defibrillator placement  06/2009    WITH GENERATOR REPLACED; BiV ICD  . US echocardiography  03/21/2008    EF 30-35%  . Cardiovascular stress test  03/20/2009    EF 33%  . Transluminal atherectomy tibial artery Left 09/12/2013  . Cardiac catheterization  10/01/2001    THERE WAS GLOBAL HYPOKINESIS AND EF 25%. THERE APPEARED TO BE GLOBAL  DECREASE IN WALL MOTION  . Toe amputation  10/04/2013    LEFT GREAT TOE AND 4TH TOE   /   DR Erlinda Hong  . Amputation Left 10/04/2013    Procedure: LEFT GREAT TOE AND SECOND TOE AMPUTATION;  Surgeon: Marianna Payment, MD;  Location: [facility];  Service: Orthopedics;  Laterality: Left;  . Colonoscopy    . Leg amputation below knee Left 11/09/2013    DR Erlinda Hong  . Amputation Left 11/09/2013    Procedure: LEFT AMPUTATION BELOW KNEE;  Surgeon: Marianna Payment, MD;  Location: [facility];  Service: Orthopedics;  Laterality: Left;  . Coronary angioplasty     History   Social History  . Marital Status: Married    Spouse Name: N/A    Number of Children: N/A  . Years of Education: N/A   Social History Main Topics  . Smoking status: Never Smoker   . Smokeless tobacco: Never Used  . Alcohol Use: No  . Drug Use: No  . Sexual Activity: Yes   Other Topics Concern  . None   Social History Narrative  . None   Family History  Problem Relation Age of Onset  . Heart disease Mother   . Hypertension Mother   . Diabetes Mother   . Diabetes Father  No Known Allergies Prior to Admission medications   Medication Sig Start Date End Date Taking? Authorizing Provider  aspirin EC 81 MG tablet Take 1 tablet (81 mg total) by mouth daily. 12/30/13   Erlene Quan, PA-C  atorvastatin (LIPITOR) 10 MG tablet Take 10 mg by mouth daily.      Historical Provider, MD  carvedilol (COREG) 12.5 MG tablet Take 12.5 mg by mouth 2 (two) times daily with a meal.    Historical Provider, MD  esomeprazole (NEXIUM) 40 MG capsule Take 40 mg by mouth daily as needed (acid reflux).     Historical Provider, MD  furosemide (LASIX) 40 MG tablet Take 1 tablet (40 mg total) by mouth 2 (two) times daily. 06/28/13   Deboraha Sprang, MD  insulin detemir (LEVEMIR) 100 UNIT/ML injection Inject 0.35 mLs (35 Units total) into the skin at bedtime. 11/28/13   Bary Leriche, PA-C  iron polysaccharides (NIFEREX) 150 MG capsule Take 1 capsule (150 mg total) by  mouth daily. 11/28/13   Bary Leriche, PA-C  metFORMIN (GLUCOPHAGE) 500 MG tablet Take 1 tablet (500 mg total) by mouth 2 (two) times daily. 01/01/14   Erlene Quan, PA-C  Multiple Vitamins-Minerals (MULTIVITAMIN PO) Take 1 tablet by mouth daily.    Historical Provider, MD  NOVOLOG 100 UNIT/ML injection Inject 4 Units into the skin daily as needed for high blood sugar (if cbg is 145 or greater).  04/20/13   Historical Provider, MD  potassium chloride SA (K-DUR,KLOR-CON) 20 MEQ tablet Take 1 tablet (20 mEq total) by mouth 2 (two) times daily. 11/28/13   Bary Leriche, PA-C  pregabalin (LYRICA) 75 MG capsule Take 1 capsule (75 mg total) by mouth 2 (two) times daily. 11/28/13   Ivan Anchors Love, PA-C  senna-docusate (SENOKOT-S) 8.6-50 MG per tablet Take 1 tablet by mouth 2 (two) times daily. 11/28/13   Bary Leriche, PA-C  silver sulfADIAZINE (SILVADENE) 1 % cream Apply 1 application topically daily.  09/26/13   Historical Provider, MD  spironolactone (ALDACTONE) 25 MG tablet Take 12.5 mg by mouth daily. 11/28/13   Historical Provider, MD  vitamin E (VITAMIN E) 1000 UNIT capsule Take 1,000 Units by mouth daily.    Historical Provider, MD   No results found.  Positive ROS: All other systems have been reviewed and were otherwise negative with the exception of those mentioned in the HPI and as above.  Physical Exam: General: Alert, no acute distress Cardiovascular: No pedal edema Respiratory: No cyanosis, no use of accessory musculature GI: No organomegaly, abdomen is soft and non-tender Skin: No lesions in the area of chief complaint Neurologic: Sensation intact distally Psychiatric: Patient is competent for consent with normal mood and affect Lymphatic: No axillary or cervical lymphadenopathy  MUSCULOSKELETAL:  - wagner stage 3 ulcers of GT, 2nd, 3rd toes  Assessment: Diabetic toe ulcers  Plan: - plan for toe amputations - will admit postop - r/b/a again reviewed with patient and wife and questions  answered to their satisfaction   N. Eduard Roux, MD [place] 11:22 AM

## 2014-01-04 NOTE — Op Note (Signed)
   Date of Surgery: 01/04/2014  INDICATIONS: Mr. Cristobal is a 68 y.o.-year-old male who has diabetes and peripheral vascular disease and presents today for surgical treatment of multiple toe osteomyelitis ;  The patient and family did consent to the procedure after discussion of the risks and benefits.  PREOPERATIVE DIAGNOSIS: Osteomyelitis of the right great, second, and third toe  POSTOPERATIVE DIAGNOSIS: Same.  PROCEDURE: Amputation of right great, second, and third toe through the metatarsal phalangeal  SURGEON: N. Eduard Roux, M.D.  ASSIST: None.  ANESTHESIA: general}  IV FLUIDS AND URINE: See anesthesia.  ESTIMATED BLOOD LOSS: 50 mL.  IMPLANTS: None  DRAINS: None  COMPLICATIONS: None.  DESCRIPTION OF PROCEDURE: The patient was brought to the operating room and placed supine on the operating table.  The patient had been signed prior to the procedure and this was documented. The patient had the anesthesia placed by the anesthesiologist.  A time-out was performed to confirm that this was the correct patient, site, side and location. The patient had an SCD on the opposite lower extremity. The patient did receive antibiotics prior to the incision and was re-dosed during the procedure as needed at indicated intervals.  The patient had the operative extremity prepped and draped in the standard surgical fashion.    An Esmarch tourniquet was placed on the calf. I use a racquet type incision based over the medial aspect of the great toe extending laterally over the second and third toes. Full-thickness flaps were created. Care was taken not to manipulate the skin more than I needed to. I then performed amputations of the great, second, and third toes through the MTP joints. I identified each neurovascular bundle and sharply incise the nerve and allowed to retract and cauterized the vessel. I then deflated the tourniquet. The wound bed appeared to be healthy and viable with good bleeding tissue.  I then irrigated the wound out using 3 L of normal saline. Hemostasis was obtained. I then closed the wound in a layered fashion using 0 PDS for the deep layer and 2-0 nylon for the skin. A sterile dressing was applied. The patient was extubated and transferred to the PACU in stable condition.   POSTOPERATIVE PLAN:  The patient will be weightbearing as tolerated in a postop shoe. He'll undergo physical therapy evaluation. He will likely need inpatient rehabilitation placement.  Azucena Cecil, MD [place] 2:54 PM

## 2014-01-04 NOTE — Anesthesia Preprocedure Evaluation (Signed)
Anesthesia Evaluation  Patient identified by MRN, date of birth, ID band Patient awake    Reviewed: Allergy & Precautions, H&P , NPO status , Patient's Chart, lab work & pertinent test results, reviewed documented beta blocker date and time   Airway Mallampati: II TM Distance: >3 FB Neck ROM: full    Dental   Pulmonary neg pulmonary ROS,  breath sounds clear to auscultation        Cardiovascular hypertension, On Medications and On Home Beta Blockers + CAD, + Peripheral Vascular Disease and +CHF + dysrhythmias Atrial Fibrillation + Cardiac Defibrillator Rhythm:regular     Neuro/Psych PSYCHIATRIC DISORDERS negative psych ROS   GI/Hepatic Neg liver ROS, GERD-  Medicated and Controlled,  Endo/Other  diabetes, Insulin Dependent, Oral Hypoglycemic Agents  Renal/GU negative Renal ROS  negative genitourinary   Musculoskeletal   Abdominal   Peds  Hematology  (+) Blood dyscrasia, anemia ,   Anesthesia Other Findings See surgeon's H&P   Reproductive/Obstetrics negative OB ROS                           Anesthesia Physical Anesthesia Plan  ASA: III  Anesthesia Plan: General   Post-op Pain Management:    Induction: Intravenous  Airway Management Planned: LMA  Additional Equipment:   Intra-op Plan:   Post-operative Plan:   Informed Consent: I have reviewed the patients History and Physical, chart, labs and discussed the procedure including the risks, benefits and alternatives for the proposed anesthesia with the patient or authorized representative who has indicated his/her understanding and acceptance.   Dental Advisory Given  Plan Discussed with: CRNA and Surgeon  Anesthesia Plan Comments:         Anesthesia Quick Evaluation

## 2014-01-04 NOTE — Anesthesia Procedure Notes (Signed)
Procedure Name: LMA Insertion Date/Time: 01/04/2014 1:51 PM Performed by: Scheryl Darter Pre-anesthesia Checklist: Patient identified, Emergency Drugs available, Suction available, Patient being monitored and Timeout performed Patient Re-evaluated:Patient Re-evaluated prior to inductionOxygen Delivery Method: Circle system utilized Preoxygenation: Pre-oxygenation with 100% oxygen Intubation Type: IV induction Ventilation: Mask ventilation without difficulty LMA: LMA inserted LMA Size: 5.0 Number of attempts: 1 Placement Confirmation: positive ETCO2 and breath sounds checked- equal and bilateral Tube secured with: Tape Dental Injury: Teeth and Oropharynx as per pre-operative assessment  Comments: Teeth loose pre-op/dental advisory given and accepted/teeth remain as pre-op after placement LMA

## 2014-01-04 NOTE — Transfer of Care (Signed)
Immediate Anesthesia Transfer of Care Note  [name]  Procedure(s) Performed: Procedure(s): Great second and third toe amputation (Right)  Patient Location: PACU  Anesthesia Type:General  Level of Consciousness: awake, alert , oriented and sedated  Airway & Oxygen Therapy: Patient Spontanous Breathing and Patient connected to nasal cannula oxygen  Post-op Assessment: Report given to PACU RN, Post -op Vital signs reviewed and stable and Patient moving all extremities  Post vital signs: Reviewed and stable  Complications: No apparent anesthesia complications

## 2014-01-05 LAB — BASIC METABOLIC PANEL
BUN: 14 mg/dL (ref 6–23)
CHLORIDE: 105 meq/L (ref 96–112)
CO2: 25 mEq/L (ref 19–32)
Calcium: 8.4 mg/dL (ref 8.4–10.5)
Creatinine, Ser: 0.99 mg/dL (ref 0.50–1.35)
GFR calc non Af Amer: 81 mL/min — ABNORMAL LOW (ref >90)
Glucose, Bld: 158 mg/dL — ABNORMAL HIGH (ref 70–99)
POTASSIUM: 4.3 meq/L (ref 3.7–5.3)
Sodium: 141 mEq/L (ref 137–147)

## 2014-01-05 LAB — GLUCOSE, CAPILLARY
GLUCOSE-CAPILLARY: 159 mg/dL — AB (ref 70–99)
GLUCOSE-CAPILLARY: 171 mg/dL — AB (ref 70–99)
Glucose-Capillary: 140 mg/dL — ABNORMAL HIGH (ref 70–99)
Glucose-Capillary: 229 mg/dL — ABNORMAL HIGH (ref 70–99)

## 2014-01-05 MED ORDER — CLOPIDOGREL BISULFATE 75 MG PO TABS
75.0000 mg | ORAL_TABLET | Freq: Every day | ORAL | Status: DC
  Administered 2014-01-06 – 2014-01-11 (×6): 75 mg via ORAL
  Filled 2014-01-05 (×7): qty 1

## 2014-01-05 MED ORDER — ASPIRIN EC 81 MG PO TBEC
81.0000 mg | DELAYED_RELEASE_TABLET | Freq: Every day | ORAL | Status: DC
  Administered 2014-01-06 – 2014-01-11 (×6): 81 mg via ORAL
  Filled 2014-01-05 (×6): qty 1

## 2014-01-05 NOTE — Evaluation (Signed)
Physical Therapy Evaluation Patient Details Name: [name] [name] MRN: [id number] DOB: May 05, 1946 Today's Date: 01/05/2014   History of Present Illness  68 y.o. 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 Hx of diabetes, PVD and recent underwent left transtibial amputation  Clinical Impression  Patient is seen following the above procedure, and presents with functional limitations due to the deficits listed below (see PT Problem List). Pt was recently seen for transtibial amputation and per wife, benefited greatly from [place] here at [facility]. They do not wish for him to d/c to SNF and request a screen for possible placement again with CIR. He is highly motivated, has strong family support, and due to co-morbidities would benefit from further rehabilitation prior to d/c home. Patient will benefit from skilled PT to increase their independence and safety with mobility.     Follow Up Recommendations CIR;Supervision/Assistance - 24 hour    Equipment Recommendations  None recommended by PT    Recommendations for Other Services Rehab consult     Precautions / Restrictions Precautions Precautions: Fall Required Braces or Orthoses: Other Brace/Splint (postop shoe) Other Brace/Splint: postop shoe Restrictions Weight Bearing Restrictions: Yes RLE Weight Bearing: Weight bearing as tolerated LLE Weight Bearing: Non weight bearing      Mobility  Bed Mobility Overal bed mobility: Needs Assistance Bed Mobility: Supine to Sit     Supine to sit: Min assist;HOB elevated     General bed mobility comments: Min assist with bed mobility for trunk control with HOB elevated. Pt able to scoot himself forward to EOB without assistance.  Transfers Overall transfer level: Needs assistance Equipment used: Rolling walker (2 wheeled) Transfers: Sit to/from Stand Sit to Stand: Min assist;+2 safety/equipment         General transfer comment: min assist with sit>stand from  lowest bed setting for boost up into standing. Verbal cues for hand placement. Pt wearing post-op boot for R foot protection. No loss of balance once standing. Second person was available for safety. Poor control with descent into recliner.  Ambulation/Gait Ambulation/Gait assistance: Min assist;+2 safety/equipment Ambulation Distance (Feet): 8 Feet Assistive device: Rolling walker (2 wheeled) Gait Pattern/deviations:  ("hop-to" pattern) Gait velocity: decreased   General Gait Details: Pt needs min assist to control RW and close guard for safety. Second person available for safety/equipment. Verbal cues to decrease hop distance as pt leans posteriorly when he takes too large of a step with RLE. Improved step control with assist for RW positioning.  Stairs            Wheelchair Mobility    Modified Rankin (Stroke Patients Only)       Balance Overall balance assessment: Needs assistance Sitting-balance support: Feet supported;No upper extremity supported Sitting balance-Leahy Scale: Fair Sitting balance - Comments: able to sit with foot supported on floor and no UE support   Standing balance support: Bilateral upper extremity supported Standing balance-Leahy Scale: Poor Standing balance comment: Needs RW to stand                             Pertinent Vitals/Pain Pt reports his pain as "high" - states his nurse has just given him pain medication prior to therapy session Patient repositioned in chair for comfort.     Home Living Family/patient expects to be discharged to:: Inpatient rehab Living Arrangements: Spouse/significant other;Children Available Help at Discharge: Other (Comment) (SNF or CIR) Type of Home: House Home Access: Stairs  to enter Entrance Stairs-Rails: Right Entrance Stairs-Number of Steps: 2 Home Layout: Two level;Able to live on main level with bedroom/bathroom Home Equipment: Gilford Rile - 2 wheels;Wheelchair - manual      Prior Function  Level of Independence: Needs assistance   Gait / Transfers Assistance Needed: ambulates short distances with RW  ADL's / Homemaking Assistance Needed: wife A with bathing back side, dressing as needed, and cooking        Hand Dominance   Dominant Hand: Right    Extremity/Trunk Assessment   Upper Extremity Assessment: Defer to OT evaluation           Lower Extremity Assessment: RLE deficits/detail;LLE deficits/detail RLE Deficits / Details: decreased ankle ROM and strength. limited assesment due to bandaging of right foot and toes LLE Deficits / Details: recent transtibial amputation     Communication   Communication: No difficulties  Cognition Arousal/Alertness: Awake/alert Behavior During Therapy: WFL for tasks assessed/performed Overall Cognitive Status: Within Functional Limits for tasks assessed                      General Comments General comments (skin integrity, edema, etc.): Scant amount of blood noted coming through bandage towards end of therapy session, nurse notified. Reviewed importance of therapeutic exercises for both left and right LEs. Pt reports he remembers many of the exercises performed while staying on inpatient rehab recently.     Exercises General Exercises - Lower Extremity Ankle Circles/Pumps: AROM;Right;10 reps;Supine      Assessment/Plan    PT Assessment Patient needs continued PT services  PT Diagnosis Difficulty walking;Abnormality of gait;Acute pain   PT Problem List Decreased strength;Decreased range of motion;Decreased activity tolerance;Decreased balance;Decreased mobility;Decreased knowledge of use of DME;Impaired sensation;Pain  PT Treatment Interventions DME instruction;Gait training;Stair training;Functional mobility training;Therapeutic activities;Therapeutic exercise;Balance training;Neuromuscular re-education;Patient/family education;Modalities   PT Goals (Current goals can be found in the Care Plan section) Acute  Rehab PT Goals Patient Stated Goal: get better and go home PT Goal Formulation: With patient Time For Goal Achievement: 01/12/14 Potential to Achieve Goals: Good    Frequency Min 3X/week   Barriers to discharge Decreased caregiver support Pt at too low of a level for his wife to safely care for him.    Co-evaluation               End of Session Equipment Utilized During Treatment: Gait belt;Other (comment) (postop boot) Activity Tolerance: Patient tolerated treatment well Patient left: in chair;with call bell/phone within reach;with family/visitor present Nurse Communication: Mobility status;Weight bearing status;Other (comment) (bleeding through bandage from R foot)         Time: 9622-2979 PT Time Calculation (min): 26 min   Charges:   PT Evaluation $Initial PT Evaluation Tier I: 1 Procedure PT Treatments $Therapeutic Activity: 8-22 mins   PT G CodesElayne Snare, [place] 01/05/2014, 11:49 AM

## 2014-01-05 NOTE — Progress Notes (Signed)
Occupational Therapy Evaluation Patient Details Name: [name] [name] MRN: [id number] DOB: 28-May-1946 Today's Date: 01/05/2014    History of Present Illness 68 y.o. 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 Hx of diabetes, PVD and recent underwent left transtibial amputation   Clinical Impression   PTA pt lived at home with his wife and required assistance from her for bathing and dressing. Pt reports that he has been receiving HHOT/PT, aide, and nursing since his LLE amputation. Pt also reports that wife performs IADLs. Pt declined OOB activities as he was fatigued from PT, however per PT note pt required min A for Sit>stand with verbal cues. Pt would benefit from continued skilled OT to increase independence and would benefit from CIR to increase strength and endurance to promote independence with ADLs.     Follow Up Recommendations  CIR;Supervision/Assistance - 24 hour    Equipment Recommendations  Other (comment) (Defer to CIR)       Precautions / Restrictions Precautions Precautions: Fall Required Braces or Orthoses: Other Brace/Splint (postop shoe) Other Brace/Splint: postop shoe Restrictions Weight Bearing Restrictions: Yes RLE Weight Bearing: Weight bearing as tolerated LLE Weight Bearing: Non weight bearing      Mobility Bed Mobility Overal bed mobility: Needs Assistance Bed Mobility: Supine to Sit     Supine to sit: Min assist;HOB elevated     General bed mobility comments: Pt in recliner before and after OT session- not assessed at this time.  Transfers Overall transfer level: Needs assistance Equipment used: Rolling walker (2 wheeled) Transfers: Sit to/from Stand Sit to Stand: Min assist;+2 safety/equipment         General transfer comment: Not assessed at this time as pt declined OOB activities. Per PT note, pt required min A for sit>stand from lowest bed setting with verbal cues.    Balance Overall balance assessment: Needs  assistance Sitting-balance support: Feet supported;No upper extremity supported Sitting balance-Leahy Scale: Fair Sitting balance - Comments: able to sit with foot supported on floor and no UE support   Standing balance support: Bilateral upper extremity supported Standing balance-Leahy Scale: Poor Standing balance comment: Needs RW to stand                            ADL Overall ADL's : Needs assistance/impaired Eating/Feeding: Independent;Sitting   Grooming: Set up;Sitting   Upper Body Bathing: Sitting;Set up   Lower Body Bathing: Sit to/from stand;Moderate assistance   Upper Body Dressing : Supervision/safety;Sitting   Lower Body Dressing: Maximal assistance;Sit to/from stand   Toilet Transfer: Minimal assistance;+2 for safety/equipment;RW (sit<>stand)   Toileting- Clothing Manipulation and Hygiene: Moderate assistance;Sit to/from stand   Tub/ Banker: Total assistance Tub/Shower Transfer Details (indicate cue type and reason): pt reports that he has not taken a shower since he previous LLE amputation; he performs sponge bathing at home with assist from wife   General ADL Comments: Pt declined OOB activities this date as he recently moved from bed to chair. Per PT note, pt performed sit<>stand with min A. Pt reports pain and weakness in L shoulder which impair his ability to use RW effectively.      Vision  Per pt report, no change from baseline.                   Perception Perception Perception Tested?: No   Praxis Praxis Praxis tested?: Within functional limits    Pertinent Vitals/Pain Pt reports pain as mild  in sitting for RLE. Repositioned and offered ice pack.     Hand Dominance Right   Extremity/Trunk Assessment Upper Extremity Assessment Upper Extremity Assessment: LUE deficits/detail;Generalized weakness LUE Deficits / Details: pt reports weakness and pain in L shoulder. Pt has full ROM in Bil UEs LUE: Unable to fully assess  due to pain   Lower Extremity Assessment Lower Extremity Assessment: Defer to PT evaluation RLE Deficits / Details: decreased ankle ROM and strength. limited assesment due to bandaging of right foot and toes RLE: Unable to fully assess due to immobilization LLE Deficits / Details: recent transtibial amputation       Communication Communication Communication: No difficulties   Cognition Arousal/Alertness: Awake/alert Behavior During Therapy: WFL for tasks assessed/performed Overall Cognitive Status: Within Functional Limits for tasks assessed                                Home Living Family/patient expects to be discharged to:: Inpatient rehab Living Arrangements: Spouse/significant other;Children Available Help at Discharge: Other (Comment) (SNF or CIR) Type of Home: House Home Access: Stairs to enter [employer] of Steps: 2 Entrance Stairs-Rails: Right Home Layout: Two level;Able to live on main level with bedroom/bathroom Alternate Level Stairs-Number of Steps: one flight Alternate Level Stairs-Rails: Right           Home Equipment: Walker - 2 wheels;Wheelchair - manual          Prior Functioning/Environment Level of Independence: Needs assistance  Gait / Transfers Assistance Needed: ambulates short distances with RW ADL's / Homemaking Assistance Needed: wife assist with bathing and dressing and performs IADLs.    Comments: Pt reports that he has HHOT/PT, aide, and nursing since his last d/c from CIR.     OT Diagnosis: Generalized weakness;Acute pain   OT Problem List: Decreased strength;Decreased range of motion;Decreased activity tolerance;Impaired balance (sitting and/or standing);Decreased safety awareness;Decreased knowledge of use of DME or AE;Decreased knowledge of precautions;Impaired UE functional use;Pain   OT Treatment/Interventions: Self-care/ADL training;Therapeutic exercise;Energy conservation;DME and/or AE  instruction;Therapeutic activities;Patient/family education;Balance training    OT Goals(Current goals can be found in the care plan section) Acute Rehab OT Goals Patient Stated Goal: get better and go home OT Goal Formulation: With patient Time For Goal Achievement: 01/12/14 Potential to Achieve Goals: Good ADL Goals Pt Will Perform Grooming: standing;with min guard assist Pt Will Perform Lower Body Bathing: with min guard assist;with adaptive equipment;sit to/from stand Pt Will Perform Lower Body Dressing: with min guard assist;with adaptive equipment;sit to/from stand Pt Will Transfer to Toilet: with min guard assist;stand pivot transfer;bedside commode Additional ADL Goal #1: Pt will perform bed mobility with min guard assist to prepare for ADLs.  OT Frequency: Min 2X/week   Barriers to D/C:    Pt's wife unable to provide level of care needed by pt.           End of Session  Activity Tolerance: Patient limited by fatigue Patient left: in chair;with call bell/phone within reach;with family/visitor present   Time: 1141-1150 OT Time Calculation (min): 9 min Charges:  OT General Charges $OT Visit: 1 Procedure OT Evaluation $Initial OT Evaluation Tier I: 1 Procedure  Juluis Rainier [phone] 01/05/2014, 12:16 PM

## 2014-01-05 NOTE — Anesthesia Postprocedure Evaluation (Signed)
Anesthesia Post Note  [name]  Procedure(s) Performed: Procedure(s) (LRB): Great second and third toe amputation (Right)  Anesthesia type: General  Patient location: PACU  Post pain: Pain level controlled  Post assessment: Patient's Cardiovascular Status Stable  Last Vitals:  Filed Vitals:   01/05/14 0549  BP: 99/61  Pulse: 79  Temp: 36.7 C  Resp: 18    Post vital signs: Reviewed and stable  Level of consciousness: alert  Complications: No apparent anesthesia complications

## 2014-01-05 NOTE — Care Management Note (Signed)
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     Action/Plan:   Case manager spoke with patient and wife concerning home health needs at discharge. Patient is active with [facility], Patient has wheelchair.   Anticipated DC Date:  01/06/2014   Anticipated DC Plan:  [place]  CM consult      [facility] Choice  Resumption Of Svcs/PTA Provider   Choice offered to / List presented to:  C-3 Spouse        HH arranged  HH-2 PT      [place].   Status of service:  Completed, signed off Medicare Important Message given?   (If response is "NO", the following Medicare IM given date fields will be blank) Date Medicare IM given:   Date Additional Medicare IM given:    Discharge Disposition:  [name]

## 2014-01-05 NOTE — Progress Notes (Signed)
Rehab Admissions Coordinator Note:  Patient was screened by [name] for appropriateness for an Inpatient Acute Rehab Consult.  At this time, we are recommending Inpatient Rehab consult.  [name] [name] 01/05/2014, 3:56 PM  I can be reached at [phone].

## 2014-01-05 NOTE — Progress Notes (Signed)
   Subjective:  Patient reports pain as mild.  No events  Objective:   VITALS:   Filed Vitals:   01/04/14 2042 01/05/14 0150 01/05/14 0549 01/05/14 1458  BP: 104/54 105/61 99/61 109/60  Pulse: 85 84 79 83  Temp: 98.1 F (36.7 C) 98.7 F (37.1 C) 98 F (36.7 C) 98 F (36.7 C)  TempSrc: Oral Oral Oral Oral  Resp: 18 18 18 18   Height:      Weight:      SpO2: 98% 99% 98% 100%    Neurologically intact Neurovascular intact Sensation intact distally Intact pulses distally Dorsiflexion/Plantar flexion intact Incision: dressing C/D/I and no drainage No cellulitis present Compartment soft   Lab Results  Component Value Date   WBC 5.1 12/30/2013   HGB 10.4* 12/30/2013   HCT 32.5* 12/30/2013   MCV 74.5* 12/30/2013   PLT 230 12/30/2013     Assessment/Plan:  1 Day Post-Op   - Expected postop acute blood loss anemia - will monitor for symptoms - Up with PT/OT - recommend CIR - DVT ppx - SCDs, ambulation, asa, plavix  - May heel weight bear in postop shoe extremity - Pain control - will change dressing tomorrow  Problem List Items Addressed This Visit   None       [name] [name] Eduard Roux 01/05/2014, 6:32 PM [phone]

## 2014-01-05 NOTE — Progress Notes (Signed)
Orthopedic Tech Progress Note Patient Details:  [name] July 02, 1946 [id number] OHF applied to bed Patient ID: [name] [name], male   DOB: 07-20-1946, 68 y.o.   MRN: [id number]   [name] 01/05/2014, 1:25 PM

## 2014-01-06 ENCOUNTER — Encounter: Payer: Self-pay | Admitting: *Deleted

## 2014-01-06 ENCOUNTER — Encounter (HOSPITAL_COMMUNITY): Payer: Self-pay | Admitting: Orthopaedic Surgery

## 2014-01-06 DIAGNOSIS — S88119A Complete traumatic amputation at level between knee and ankle, unspecified lower leg, initial encounter: Secondary | ICD-10-CM

## 2014-01-06 DIAGNOSIS — I739 Peripheral vascular disease, unspecified: Secondary | ICD-10-CM

## 2014-01-06 DIAGNOSIS — L98499 Non-pressure chronic ulcer of skin of other sites with unspecified severity: Secondary | ICD-10-CM

## 2014-01-06 LAB — CBC
HCT: 28 % — ABNORMAL LOW (ref 39.0–52.0)
Hemoglobin: 9 g/dL — ABNORMAL LOW (ref 13.0–17.0)
MCH: 23.6 pg — ABNORMAL LOW (ref 26.0–34.0)
MCHC: 32.1 g/dL (ref 30.0–36.0)
MCV: 73.5 fL — AB (ref 78.0–100.0)
PLATELETS: 274 10*3/uL (ref 150–400)
RBC: 3.81 MIL/uL — AB (ref 4.22–5.81)
RDW: 18.6 % — ABNORMAL HIGH (ref 11.5–15.5)
WBC: 6.4 10*3/uL (ref 4.0–10.5)

## 2014-01-06 LAB — GLUCOSE, CAPILLARY
GLUCOSE-CAPILLARY: 142 mg/dL — AB (ref 70–99)
GLUCOSE-CAPILLARY: 219 mg/dL — AB (ref 70–99)
Glucose-Capillary: 105 mg/dL — ABNORMAL HIGH (ref 70–99)
Glucose-Capillary: 221 mg/dL — ABNORMAL HIGH (ref 70–99)

## 2014-01-06 LAB — BASIC METABOLIC PANEL
BUN: 15 mg/dL (ref 6–23)
CO2: 27 meq/L (ref 19–32)
Calcium: 8.9 mg/dL (ref 8.4–10.5)
Chloride: 102 mEq/L (ref 96–112)
Creatinine, Ser: 1.03 mg/dL (ref 0.50–1.35)
GFR calc Af Amer: 83 mL/min — ABNORMAL LOW (ref >90)
GFR, EST NON AFRICAN AMERICAN: 72 mL/min — AB (ref >90)
Glucose, Bld: 117 mg/dL — ABNORMAL HIGH (ref 70–99)
Potassium: 4 mEq/L (ref 3.7–5.3)
Sodium: 140 mEq/L (ref 137–147)

## 2014-01-06 MED ORDER — HYDROCODONE-ACETAMINOPHEN 5-325 MG PO TABS: 1.0000 | ORAL_TABLET | ORAL | Status: DC | PRN

## 2014-01-06 NOTE — Consult Note (Signed)
Physical Medicine and Rehabilitation Consult  Reason for Consult: Right 1-3 rd toe amputation in patient with recent L-BKA.  Referring Physician: Dr. Erlinda Hong  HPI: [name] [name] is a 68 y.o. 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 He was admitted on 01/04/14 for right 1st, 2nd and 3rd toes amputation by Dr. Erlinda Hong. Post op to be WBAT in post op shoe. PT/OT evaluations done yesterday and patient limited by pain as well as deconditioning. MD and rehab team recommending CIR for follow up therapies. Patient did well in CIR and had progressed to supervision level with ability to walk short distances. Patient and wife interested CIR to get back to prior level of activity.   Patient ready for prosthetic fitting left BKA Review of Systems  HENT: Negative for hearing loss.   Eyes: Positive for blurred vision. Negative for double vision.  Respiratory: Negative for cough and shortness of breath.   Cardiovascular: Negative for chest pain, palpitations and leg swelling.  Gastrointestinal: Negative for heartburn, abdominal pain and constipation.  Musculoskeletal: Positive for joint pain (Right foot pain continues. Minimal pain L-BKA site) and myalgias. Negative for back pain and neck pain.  Neurological: Positive for weakness. Negative for dizziness and headaches.    Past Medical History  Diagnosis Date  . GERD (gastroesophageal reflux disease)   . Obesity   . Cardiomyopathy, nonischemic     a. Cath 2003: mild nonobstructive CAD, EF 25% at that time.  . Hypertension   . Chronic systolic CHF (congestive heart failure)     a. NICM EF 25% dating back to at least 2003.  [name] PAF (paroxysmal atrial fibrillation)     a. Noted on ICD interrogation 2012;  b. coumadin d/c'd 01/2013.  [name] NSVT (nonsustained ventricular tachycardia)     a. Noted on ICD interrogation in 2011.  [name] LBBB (left bundle branch  block)   . Critical lower limb ischemia   . High cholesterol   . PAD (peripheral artery disease)     a. 08/2013 Periph Angio/PTA: Abd Ao nl, RLE- 3v runoff, PT diff dzs, AT 90p, LLE 2v runoff, PT 100, AT 55m (diamondback ORA/chocolate balloon PTA).  . Type II diabetes mellitus   . Uncontrolled pain, Lt toe 09/21/2013  . Gangrenous toe, Lt toe 09/21/2013  . CAD (coronary artery disease)     a. Nonobstructive by cath 09/2001.  [name] Automatic implantable cardioverter-defibrillator in situ     a. 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  . Dementia   . Arthritis   . History of blood transfusion     Past Surgical History  Procedure Laterality Date  . Lithotripsy  2001  . Cervical spine surgery  1994  . Cardiac defibrillator placement  06/2009    WITH GENERATOR REPLACED; BiV ICD  . US echocardiography  03/21/2008    EF 30-35%  . Cardiovascular stress test  03/20/2009    EF 33%  . Transluminal atherectomy tibial artery Left 09/12/2013  . Cardiac catheterization  10/01/2001    THERE WAS GLOBAL HYPOKINESIS AND EF 25%. THERE APPEARED TO BE GLOBAL DECREASE IN WALL MOTION  . Toe amputation  10/04/2013    LEFT GREAT TOE AND 4TH TOE   /   DR Erlinda Hong  . Amputation Left 10/04/2013    Procedure: LEFT GREAT TOE AND SECOND TOE AMPUTATION;  Surgeon: Marianna Payment,  MD;  Location: [facility];  Service: Orthopedics;  Laterality: Left;  . Colonoscopy    . Leg amputation below knee Left 11/09/2013    DR Erlinda Hong  . Amputation Left 11/09/2013    Procedure: LEFT AMPUTATION BELOW KNEE;  Surgeon: Marianna Payment, MD;  Location: [place];  Service: Orthopedics;  Laterality: Left;  . Coronary angioplasty      Family History  Problem Relation Age of Onset  . Heart disease Mother   . Hypertension Mother   . Diabetes Mother   . Diabetes Father     Social History:  Married. Was supervision at wheelchair level PTA. Per  reports that he has never smoked. He has never used smokeless tobacco. He reports  that he does not drink alcohol or use illicit drugs.  Allergies: No Known Allergies  Medications Prior to Admission  Medication Sig Dispense Refill  . aspirin EC 81 MG tablet Take 1 tablet (81 mg total) by mouth daily.      [name] atorvastatin (LIPITOR) 10 MG tablet Take 10 mg by mouth daily.        . carvedilol (COREG) 12.5 MG tablet Take 12.5 mg by mouth 2 (two) times daily with a meal.      . esomeprazole (NEXIUM) 40 MG capsule Take 40 mg by mouth daily as needed (acid reflux).       . furosemide (LASIX) 40 MG tablet Take 1 tablet (40 mg total) by mouth 2 (two) times daily.  60 tablet  1  . insulin detemir (LEVEMIR) 100 UNIT/ML injection Inject 0.35 mLs (35 Units total) into the skin at bedtime.  10 mL  11  . iron polysaccharides (NIFEREX) 150 MG capsule Take 1 capsule (150 mg total) by mouth daily.  30 capsule  1  . metFORMIN (GLUCOPHAGE) 500 MG tablet Take 1 tablet (500 mg total) by mouth 2 (two) times daily.  60 tablet    . Multiple Vitamins-Minerals (MULTIVITAMIN PO) Take 1 tablet by mouth daily.      [name] NOVOLOG 100 UNIT/ML injection Inject 4 Units into the skin daily as needed for high blood sugar (if cbg is 145 or greater).       . potassium chloride SA (K-DUR,KLOR-CON) 20 MEQ tablet Take 1 tablet (20 mEq total) by mouth 2 (two) times daily.  60 tablet  1  . pregabalin (LYRICA) 75 MG capsule Take 1 capsule (75 mg total) by mouth 2 (two) times daily.  60 capsule  1  . senna-docusate (SENOKOT-S) 8.6-50 MG per tablet Take 1 tablet by mouth 2 (two) times daily.  60 tablet  1  . silver sulfADIAZINE (SILVADENE) 1 % cream Apply 1 application topically daily.       [name] spironolactone (ALDACTONE) 25 MG tablet Take 12.5 mg by mouth daily.      . vitamin E (VITAMIN E) 1000 UNIT capsule Take 1,000 Units by mouth daily.        Home: Home Living Family/patient expects to be discharged to:: Inpatient rehab Living Arrangements: Spouse/significant other;Children Available Help at Discharge: Other  (Comment) (SNF or CIR) Type of Home: House Home Access: Stairs to enter [employer] of Steps: 2 Entrance Stairs-Rails: Right Home Layout: Two level;Able to live on main level with bedroom/bathroom Alternate Level Stairs-Number of Steps: one flight Alternate Level Stairs-Rails: Right Home Equipment: Walker - 2 wheels;Wheelchair - manual  Functional History: Prior Function Level of Independence: Needs assistance Gait / Transfers Assistance Needed: ambulates short distances with RW ADL's / [employer]  Needed: wife assist with bathing and dressing and performs IADLs.  Comments: Pt reports that he has HHOT/PT, aide, and nursing since his last d/c from CIR.  Functional Status:  Mobility: Bed Mobility Overal bed mobility: Needs Assistance Bed Mobility: Supine to Sit Supine to sit: Min assist;HOB elevated General bed mobility comments: Pt in recliner before and after OT session- not assessed at this time. Transfers Overall transfer level: Needs assistance Equipment used: Rolling walker (2 wheeled) Transfers: Sit to/from Stand Sit to Stand: Min assist;+2 safety/equipment General transfer comment: Not assessed at this time as pt declined OOB activities. Per PT note, pt required min A for sit>stand from lowest bed setting with verbal cues. Ambulation/Gait Ambulation/Gait assistance: Min assist;+2 safety/equipment Ambulation Distance (Feet): 8 Feet Assistive device: Rolling walker (2 wheeled) Gait Pattern/deviations:  ("hop-to" pattern) Gait velocity: decreased General Gait Details: Pt needs min assist to control RW and close guard for safety. Second person available for safety/equipment. Verbal cues to decrease hop distance as pt leans posteriorly when he takes too large of a step with RLE. Improved step control with assist for RW positioning.    ADL: ADL Overall ADL's : Needs assistance/impaired Eating/Feeding: Independent;Sitting Grooming: Set up;Sitting Upper  Body Bathing: Sitting;Set up Lower Body Bathing: Sit to/from stand;Moderate assistance Upper Body Dressing : Supervision/safety;Sitting Lower Body Dressing: Maximal assistance;Sit to/from stand Toilet Transfer: Minimal assistance;+2 for safety/equipment;RW (sit<>stand) Toileting- Clothing Manipulation and Hygiene: Moderate assistance;Sit to/from stand Tub/ Banker: Total assistance Tub/Shower Transfer Details (indicate cue type and reason): pt reports that he has not taken a shower since he previous LLE amputation; he performs sponge bathing at home with assist from wife General ADL Comments: Pt declined OOB activities this date as he recently moved from bed to chair. Per PT note, pt performed sit<>stand with min A. Pt reports pain and weakness in L shoulder which impair his ability to use RW effectively.   Cognition: Cognition Overall Cognitive Status: Within Functional Limits for tasks assessed Orientation Level: Oriented X4 Cognition Arousal/Alertness: Awake/alert Behavior During Therapy: WFL for tasks assessed/performed Overall Cognitive Status: Within Functional Limits for tasks assessed  Blood pressure 101/50, pulse 85, temperature 97.9 F (36.6 C), temperature source Oral, resp. rate 18, height 5\' 9"  (1.753 m), weight 78.472 kg (173 lb), SpO2 96.00%. Physical Exam  Nursing note and vitals reviewed. Constitutional: He is oriented to person, place, and time. He appears well-developed and well-nourished.  HENT:  Head: Normocephalic and atraumatic.  Eyes: Conjunctivae are normal. Pupils are equal, round, and reactive to light.  Neck: Normal range of motion. Neck supple.  Cardiovascular: Normal rate and regular rhythm.   Respiratory: Effort normal and breath sounds normal. No respiratory distress. He has no wheezes.  GI: Soft. Bowel sounds are normal. He exhibits no distension. There is no tenderness.  Musculoskeletal:  Right foot with compressive dressing and tender to  touch. L-BKA site well healed without edema or tenderness.   Neurological: He is alert and oriented to person, place, and time.  Skin: Skin is warm and dry.  Psychiatric: He has a normal mood and affect. His behavior is normal. Judgment and thought content normal.   motor strength is 5/5 bilateral deltoid, bicep, tricep, grip 4/5 bilateral hip flexor 4/5 right knee extensor 3 minus right ankle dorsiflexor plantar flexor  Results for orders placed during the hospital encounter of 01/04/14 (from the past 24 hour(s))  GLUCOSE, CAPILLARY     Status: Abnormal   Collection Time    01/05/14 11:23 AM  Result Value Ref Range   Glucose-Capillary 159 (*) 70 - 99 mg/dL  GLUCOSE, CAPILLARY     Status: Abnormal   Collection Time    01/05/14  4:20 PM      Result Value Ref Range   Glucose-Capillary 229 (*) 70 - 99 mg/dL   Comment 1 Notify RN    GLUCOSE, CAPILLARY     Status: Abnormal   Collection Time    01/05/14  9:37 PM      Result Value Ref Range   Glucose-Capillary 171 (*) 70 - 99 mg/dL  BASIC METABOLIC PANEL     Status: Abnormal   Collection Time    01/06/14  4:57 AM      Result Value Ref Range   Sodium 140  137 - 147 mEq/L   Potassium 4.0  3.7 - 5.3 mEq/L   Chloride 102  96 - 112 mEq/L   CO2 27  19 - 32 mEq/L   Glucose, Bld 117 (*) 70 - 99 mg/dL   BUN 15  6 - 23 mg/dL   Creatinine, Ser 1.03  0.50 - 1.35 mg/dL   Calcium 8.9  8.4 - 10.5 mg/dL   GFR calc non Af Amer 72 (*) >90 mL/min   GFR calc Af Amer 83 (*) >90 mL/min  CBC     Status: Abnormal   Collection Time    01/06/14  4:57 AM      Result Value Ref Range   WBC 6.4  4.0 - 10.5 K/uL   RBC 3.81 (*) 4.22 - 5.81 MIL/uL   Hemoglobin 9.0 (*) 13.0 - 17.0 g/dL   HCT 28.0 (*) 39.0 - 52.0 %   MCV 73.5 (*) 78.0 - 100.0 fL   MCH 23.6 (*) 26.0 - 34.0 pg   MCHC 32.1  30.0 - 36.0 g/dL   RDW 18.6 (*) 11.5 - 15.5 %   Platelets 274  150 - 400 K/uL  GLUCOSE, CAPILLARY     Status: Abnormal   Collection Time    01/06/14  6:26 AM       Result Value Ref Range   Glucose-Capillary 105 (*) 70 - 99 mg/dL   No results found.  Assessment/Plan: Diagnosis: Right first second and third toe amputation secondary to osteomyelitis in a patient with recent left BKA 1. Does the need for close, 24 hr/day medical supervision in concert with the patient's rehab needs make it unreasonable for this patient to be served in a less intensive setting? Yes 2. Co-Morbidities requiring supervision/potential complications: CHF, diabetes, peripheral artery disease 3. Due to bladder management, bowel management, safety, skin/wound care, disease management, medication administration, pain management and patient education, does the patient require 24 hr/day rehab nursing? Yes 4. Does the patient require coordinated care of a physician, rehab nurse, PT (1-2 hrs/day, 5 days/week) and OT (1-2 hrs/day, 5 days/week) to address physical and functional deficits in the context of the above medical diagnosis(es)? Yes Addressing deficits in the following areas: balance, endurance, locomotion, strength, transferring, bowel/bladder control, bathing, dressing, feeding, grooming and toileting 5. Can the patient actively participate in an intensive therapy program of at least 3 hrs of therapy per day at least 5 days per week? Yes 6. The potential for patient to make measurable gains while on inpatient rehab is good 7. Anticipated functional outcomes upon discharge from inpatient rehab are modified independent  with PT, modified independent with OT, n/a with SLP. 8. Estimated rehab length of stay to reach the above functional goals is: 7-8  days 9. Does the patient have adequate social supports to accommodate these discharge functional goals? Potentially 10. Anticipated D/C setting: Home 11. Anticipated post D/C treatments: Tool therapy 12. Overall Rehab/Functional Prognosis: good  RECOMMENDATIONS: This patient's condition is appropriate for continued rehabilitative care in  the following setting: CIR Patient has agreed to participate in recommended program. Yes Note that insurance prior authorization may be required for reimbursement for recommended care.  Comment:     01/06/2014

## 2014-01-06 NOTE — Progress Notes (Signed)
Physical Therapy Treatment Patient Details Name: [name] [name] MRN: [id number] DOB: February 28, 1946 Today's Date: 01/06/2014    History of Present Illness 68 y.o. 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 Hx of diabetes, PVD and recent underwent left transtibial amputation    PT Comments    Pt progressing slowly towards physical therapy goals. Increased ambulatory distance to 12 feet this AM and shows better control of rolling walker however, very poor balance upon standing and had difficulty with sit to stand transfer. Patient will continue to benefit from skilled physical therapy services to further improve independence with functional mobility.   Follow Up Recommendations  CIR;Supervision/Assistance - 24 hour     Equipment Recommendations  None recommended by PT    Recommendations for Other Services Rehab consult     Precautions / Restrictions Precautions Precautions: Fall Required Braces or Orthoses: Other Brace/Splint (postop shoe) Other Brace/Splint: postop shoe Restrictions Weight Bearing Restrictions: Yes RLE Weight Bearing: Weight bearing as tolerated LLE Weight Bearing: Non weight bearing    Mobility  Bed Mobility Overal bed mobility: Needs Assistance Bed Mobility: Supine to Sit     Supine to sit: HOB elevated;Min guard     General bed mobility comments: Min guard for safety with HOB elevated. Pt able to scoot himself forward to EOB without assistance. Relies heavily on rail and takes extra time to assume sitting position.  Transfers Overall transfer level: Needs assistance Equipment used: Rolling walker (2 wheeled) Transfers: Sit to/from Stand Sit to Stand: Mod assist         General transfer comment: Mod asssit for sit<>stand from lowest bed setting with decreased control when sitting in reclining chair. Pt required two attempts with mod assist for sit>stand for power up boost into standing and balance. Leaning heavily to posterior and  left. Max verbal and tactile cues to bring hips forward under base of support and to bear weight anteriorly through arms.  Ambulation/Gait Ambulation/Gait assistance: Min assist Ambulation Distance (Feet): 12 Feet Assistive device: Rolling walker (2 wheeled) (postop boot) Gait Pattern/deviations: Leaning posteriorly ("hop-to" pattern) Gait velocity: decreased   General Gait Details: Better ambulating than with static standing, as it requires pt to weight shift anteriorly through RW and use arms for support. Verbal cues for foot placement, and demonstrates better control with RW today compared to session yesterday. Pt greatly fatigued at 12 feet and required to sit. Arms very tired per pt, and noticed decline in posture as distance increased.   Stairs            Wheelchair Mobility    Modified Rankin (Stroke Patients Only)       Balance                                    Cognition Arousal/Alertness: Awake/alert Behavior During Therapy: WFL for tasks assessed/performed Overall Cognitive Status: Within Functional Limits for tasks assessed                      Exercises General Exercises - Lower Extremity Ankle Circles/Pumps: AROM;Right;10 reps;Supine Long Arc Quad: AROM;Right;10 reps;Seated Hip Flexion/Marching: AROM;Both;10 reps;Seated    General Comments General comments (skin integrity, edema, etc.): reviewed precautions and therapeutic exercise. discussed importance of being OOB during the day and to continue maintenance care of left residual limb with daily stretching and exercises      Pertinent Vitals/Pain Pt reports pain as "  fine at the moment" Patient repositioned in chair for comfort.     Home Living                      Prior Function            PT Goals (current goals can now be found in the care plan section) Acute Rehab PT Goals Patient Stated Goal: get better and go home PT Goal Formulation: With patient Time For  Goal Achievement: 01/12/14 Potential to Achieve Goals: Good Progress towards PT goals: Progressing toward goals    Frequency  Min 3X/week    PT Plan Current plan remains appropriate    Co-evaluation             End of Session Equipment Utilized During Treatment: Gait belt;Other (comment) (postop boot) Activity Tolerance: Patient tolerated treatment well Patient left: in chair;with call bell/phone within reach;with family/visitor present     Time: 5102-5852 PT Time Calculation (min): 19 min  Charges:  $Gait Training: 8-22 mins                    G Codes:      Camille Bal [place], [place] 01/06/2014, 1:51 PM

## 2014-01-06 NOTE — Progress Notes (Signed)
Subjective:  Patient reports pain as mild.  No events  Objective:   VITALS:   Filed Vitals:   01/05/14 0150 01/05/14 0549 01/05/14 1458 01/06/14 0554  BP: 105/61 99/61 109/60 101/50  Pulse: 84 79 83 85  Temp: 98.7 F (37.1 C) 98 F (36.7 C) 98 F (36.7 C) 97.9 F (36.6 C)  TempSrc: Oral Oral Oral Oral  Resp: 18 18 18 18   Height:      Weight:      SpO2: 99% 98% 100% 96%    Incision c/d/i No signs of wound breakdown or infection   Lab Results  Component Value Date   WBC 6.4 01/06/2014   HGB 9.0* 01/06/2014   HCT 28.0* 01/06/2014   MCV 73.5* 01/06/2014   PLT 274 01/06/2014     Assessment/Plan:  2 Days Post-Op   - Expected postop acute blood loss anemia - asymptomatic - Up with PT/OT - recommend CIR - DVT ppx - SCDs, ambulation, asa, plavix  - May heel weight bear in postop shoe extremity - Pain control - dressing changed - CIR pending insurance  Problem List Items Addressed This Visit   None       [name] [name] 01/06/2014, 12:50 PM [phone]

## 2014-01-06 NOTE — Discharge Summary (Addendum)
Physician Discharge Summary      Patient ID: [name] [name] MRN: [id number] DOB/AGE: December 19, 1945 68 y.o.  Admit date: 01/04/2014 Discharge date: 01/11/14  Admission Diagnoses:  Right great, second, third toe osteomyelitis  Discharge Diagnoses:  Active Problems:   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   Osteomyelitis of toe of right foot   Past Medical History  Diagnosis Date  . GERD (gastroesophageal reflux disease)   . Obesity   . Cardiomyopathy, nonischemic     a. Cath 2003: mild nonobstructive CAD, EF 25% at that time.  . Hypertension   . Chronic systolic CHF (congestive heart failure)     a. NICM EF 25% dating back to at least 2003.  [name] PAF (paroxysmal atrial fibrillation)     a. Noted on ICD interrogation 2012;  b. coumadin d/c'd 01/2013.  [name] NSVT (nonsustained ventricular tachycardia)     a. Noted on ICD interrogation in 2011.  [name] LBBB (left bundle branch block)   . Critical lower limb ischemia   . High cholesterol   . PAD (peripheral artery disease)     a. 08/2013 Periph Angio/PTA: Abd Ao nl, RLE- 3v runoff, PT diff dzs, AT 90p, LLE 2v runoff, PT 100, AT 47m (diamondback ORA/chocolate balloon PTA).  . Type II diabetes mellitus   . Uncontrolled pain, Lt toe 09/21/2013  . Gangrenous toe, Lt toe 09/21/2013  . CAD (coronary artery disease)     a. Nonobstructive by cath 09/2001.  [name] Automatic implantable cardioverter-defibrillator in situ     a. 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  . Dementia   . Arthritis   . History of blood transfusion     Surgeries: Procedure(s): Great second and third toe amputation on 01/04/2014   Consultants (if any): Physical therapy  Discharged Condition: Improved  Hospital Course: [name] [name] is an 68 y.o. male who was admitted 01/04/2014 with a diagnosis of right great, second, third toe osteomyelitis and went to the operating room on 01/04/2014 and underwent the above named procedures.    He  was given perioperative antibiotics:      Anti-infectives   Start     Dose/Rate Route Frequency Ordered Stop   01/04/14 2000  ceFAZolin (ANCEF) IVPB 2 g/50 mL premix     2 g 100 mL/hr over 30 Minutes Intravenous Every 6 hours 01/04/14 1649 01/05/14 1038   01/04/14 0600  ceFAZolin (ANCEF) IVPB 2 g/50 mL premix     2 g 100 mL/hr over 30 Minutes Intravenous On call to O.R. 01/03/14 1410 01/04/14 1352    .  He was given sequential compression devices, early ambulation, and aspirin and plavix for DVT prophylaxis.  He benefited maximally from the hospital stay and there were no complications.  He was denied for CIR.  He was discharged home with home health services.  His incision was clean, dry and intact on postop day 2 during the dressing change.  His hemoglobin was stable and he remained asymptomatic.  Recent vital signs:  Filed Vitals:   01/10/14 1952  BP: 125/52  Pulse: 95  Temp: 98.8 F (37.1 C)  Resp: 16    Recent laboratory studies:  Lab Results  Component Value Date   HGB 9.0* 01/06/2014   HGB 10.4* 12/30/2013   HGB 11.2* 12/27/2013   Lab Results  Component Value Date   WBC 6.4 01/06/2014   PLT 274 01/06/2014   Lab Results  Component Value Date   INR 1.02 12/27/2013  Lab Results  Component Value Date   NA 142 01/07/2014   K 4.1 01/07/2014   CL 105 01/07/2014   CO2 27 01/07/2014   BUN 15 01/07/2014   CREATININE 0.86 01/07/2014   GLUCOSE 94 01/07/2014    Discharge Medications:     Medication List         aspirin EC 81 MG tablet  Take 1 tablet (81 mg total) by mouth daily.     atorvastatin 10 MG tablet  Commonly known as:  LIPITOR  Take 10 mg by mouth daily.     carvedilol 12.5 MG tablet  Commonly known as:  COREG  Take 12.5 mg by mouth 2 (two) times daily with a meal.     esomeprazole 40 MG capsule  Commonly known as:  NEXIUM  Take 40 mg by mouth daily as needed (acid reflux).     furosemide 40 MG tablet  Commonly known as:  LASIX  Take 1 tablet (40 mg  total) by mouth 2 (two) times daily.     HYDROcodone-acetaminophen 5-325 MG per tablet  Commonly known as:  NORCO/VICODIN  Take 1-2 tablets by mouth every 4 (four) hours as needed for moderate pain.     insulin detemir 100 UNIT/ML injection  Commonly known as:  LEVEMIR  Inject 0.35 mLs (35 Units total) into the skin at bedtime.     iron polysaccharides 150 MG capsule  Commonly known as:  NIFEREX  Take 1 capsule (150 mg total) by mouth daily.     metFORMIN 500 MG tablet  Commonly known as:  GLUCOPHAGE  Take 1 tablet (500 mg total) by mouth 2 (two) times daily.     MULTIVITAMIN PO  Take 1 tablet by mouth daily.     NOVOLOG 100 UNIT/ML injection  Generic drug:  insulin aspart  Inject 4 Units into the skin daily as needed for high blood sugar (if cbg is 145 or greater).     potassium chloride SA 20 MEQ tablet  Commonly known as:  K-DUR,KLOR-CON  Take 1 tablet (20 mEq total) by mouth 2 (two) times daily.     pregabalin 75 MG capsule  Commonly known as:  LYRICA  Take 1 capsule (75 mg total) by mouth 2 (two) times daily.     senna-docusate 8.6-50 MG per tablet  Commonly known as:  Senokot-S  Take 1 tablet by mouth 2 (two) times daily.     silver sulfADIAZINE 1 % cream  Commonly known as:  SILVADENE  Apply 1 application topically daily.     spironolactone 25 MG tablet  Commonly known as:  ALDACTONE  Take 12.5 mg by mouth daily.     vitamin E 1000 UNIT capsule  Generic drug:  vitamin E  Take 1,000 Units by mouth daily.        Diagnostic Studies: No results found.  Disposition: 01-Home or Self Care  Discharge Instructions   Call MD / Call 911    Complete by:  As directed   If you experience chest pain or shortness of breath, CALL 911 and be transported to the hospital emergency room.  If you develope a fever above 101.5 F, pus (white drainage) or increased drainage or redness at the wound, or calf pain, call your surgeon's office.     Constipation Prevention     Complete by:  As directed   Drink plenty of fluids.  Prune juice may be helpful.  You may use a stool softener, such as Colace (over the  counter) 100 mg twice a day.  Use MiraLax (over the counter) for constipation as needed.     Diet - low sodium heart healthy    Complete by:  As directed      Driving restrictions    Complete by:  As directed   No driving while taking narcotic pain meds.     Increase activity slowly as tolerated    Complete by:  As directed            Follow-up Information   Follow up with [facility]. (Someone from [facility] will contact you concerning start date and time for therapy.)    Contact information:   [address] [place] [address] [phone]       Follow up with Marianna Payment, MD In 1 week.   Specialty:  Orthopedic Surgery   Contact information:   [place] [address] [phone]        Signed: Marianna Payment 01/10/2014, 8:40 PM

## 2014-01-07 LAB — GLUCOSE, CAPILLARY
Glucose-Capillary: 95 mg/dL (ref 70–99)
Glucose-Capillary: 169 mg/dL — ABNORMAL HIGH (ref 70–99)
Glucose-Capillary: 270 mg/dL — ABNORMAL HIGH (ref 70–99)
Glucose-Capillary: 195 mg/dL — ABNORMAL HIGH (ref 70–99)

## 2014-01-07 LAB — BASIC METABOLIC PANEL
BUN: 15 mg/dL (ref 6–23)
CO2: 27 mEq/L (ref 19–32)
CREATININE: 0.86 mg/dL (ref 0.50–1.35)
Calcium: 8.8 mg/dL (ref 8.4–10.5)
Chloride: 105 mEq/L (ref 96–112)
GFR calc Af Amer: >90 mL/min (ref >90)
GFR calc non Af Amer: 86 mL/min — ABNORMAL LOW (ref >90)
Glucose, Bld: 94 mg/dL (ref 70–99)
Potassium: 4.1 mEq/L (ref 3.7–5.3)
Sodium: 142 mEq/L (ref 137–147)

## 2014-01-07 NOTE — Progress Notes (Signed)
Physical Therapy Treatment Patient Details Name: [name] [name] MRN: [id number] DOB: 1945-11-09 Today's Date: 01-13-14    History of Present Illness 68 y.o. 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 Hx of diabetes, PVD and recent underwent left transtibial amputation    PT Comments    Pt making steady progress toward goals.  Follow Up Recommendations  CIR;Supervision/Assistance - 24 hour     Equipment Recommendations  None recommended by PT    Recommendations for Other Services Rehab consult     Precautions / Restrictions Precautions Precautions: Fall Required Braces or Orthoses: Other Brace/Splint Other Brace/Splint: postop shoe Restrictions RLE Weight Bearing: Weight bearing as tolerated (heel only) LLE Weight Bearing: Non weight bearing    Mobility  Bed Mobility Overal bed mobility: Needs Assistance Bed Mobility: Supine to Sit     Supine to sit: Min assist     General bed mobility comments: Min assist for elevation of trunk into seated position at edge of bed with bed flat and no rails used. cues needed for technique  Transfers Overall transfer level: Needs assistance Equipment used: Rolling walker (2 wheeled) Transfers: Sit to/from [facility] Sit to Stand: Min assist Stand pivot transfers: Min assist       General transfer comment: cues for hand placement with sit/stand transfers.cues for posture, walker use and to "bear down" on walker to lift and move right leg with stand pivot transfer.       Cognition Arousal/Alertness: Awake/alert Behavior During Therapy: WFL for tasks assessed/performed Overall Cognitive Status: Within Functional Limits for tasks assessed                      Exercises General Exercises - Lower Extremity Ankle Circles/Pumps: AROM;Right;10 reps;Supine Amputee Exercises Quad Sets: AROM;Strengthening;Both;10 reps;Supine Hip Extension: AROM;Strengthening;Left;10 reps;Standing Hip  ABduction/ADduction: AROM;Strengthening;Left;10 reps;Supine;Standing;Limitations Hip Abduction/Adduction Limitations: x10 each with supine and standing at walker.        PT Goals (current goals can now be found in the care plan section) Acute Rehab PT Goals Patient Stated Goal: get better and go home PT Goal Formulation: With patient Time For Goal Achievement: 01/12/14 Potential to Achieve Goals: Good Progress towards PT goals: Progressing toward goals    Frequency  Min 3X/week    PT Plan Current plan remains appropriate    End of Session Equipment Utilized During Treatment: Gait belt;Other (comment) (post op shoe)   Patient left: in chair;with call bell/phone within reach;with nursing/sitter in room     Time: 1137-1151 PT Time Calculation (min): 14 min  Charges:  $Therapeutic Exercise: 8-22 mins                    G Codes:      Willow Ora 2014/01/13, 1:17 PM  Willow Ora, PTA Office- [phone]

## 2014-01-07 NOTE — Progress Notes (Signed)
Subjective: Patient doing well having minimal to no pain   Objective: Vital signs in last 24 hours: Temp:  [97.6 F (36.4 C)-98.1 F (36.7 C)] 97.6 F (36.4 C) (05/16 0513) Pulse Rate:  [77-89] 89 (05/16 0513) Resp:  [18-20] 18 (05/16 0513) BP: (100-114)/(53-60) 109/60 mmHg (05/16 0513) SpO2:  [100 %] 100 % (05/16 0513)  Intake/Output from previous day: 05/15 0701 - 05/16 0700 In: 780 [P.O.:780] Out: 3300 [Urine:3300] Intake/Output this shift:    Exam:  No cellulitis present  Labs:  Recent Labs  01/06/14 0457  HGB 9.0*    Recent Labs  01/06/14 0457  WBC 6.4  RBC 3.81*  HCT 28.0*  PLT 274    Recent Labs  01/06/14 0457 01/07/14 0645  NA 140 142  K 4.0 4.1  CL 102 105  CO2 27 27  BUN 15 15  CREATININE 1.03 0.86  GLUCOSE 117* 94  CALCIUM 8.9 8.8   No results found for this basename: LABPT, INR,  in the last 72 hours  Assessment/Plan: Plan skilled nursing facility transfer Monday   [name] [name] 01/07/2014, 9:54 AM

## 2014-01-08 LAB — GLUCOSE, CAPILLARY
GLUCOSE-CAPILLARY: 187 mg/dL — AB (ref 70–99)
GLUCOSE-CAPILLARY: 251 mg/dL — AB (ref 70–99)
Glucose-Capillary: 113 mg/dL — ABNORMAL HIGH (ref 70–99)
Glucose-Capillary: 207 mg/dL — ABNORMAL HIGH (ref 70–99)

## 2014-01-08 NOTE — Progress Notes (Signed)
Subjective: Pt stable - no complaints   Objective: Vital signs in last 24 hours: Temp:  [98 F (36.7 C)-99.3 F (37.4 C)] 98 F (36.7 C) (05/17 0657) Pulse Rate:  [83-103] 88 (05/17 0800) Resp:  [18-20] 18 (05/17 0657) BP: (103-113)/(56-64) 106/62 mmHg (05/17 0800) SpO2:  [98 %-100 %] 98 % (05/17 0657)  Intake/Output from previous day: 05/16 0701 - 05/17 0700 In: 960 [P.O.:960] Out: 1450 [Urine:1450] Intake/Output this shift: Total I/O In: 240 [P.O.:240] Out: -   Exam:  No cellulitis present  Labs:  Recent Labs  01/06/14 0457  HGB 9.0*    Recent Labs  01/06/14 0457  WBC 6.4  RBC 3.81*  HCT 28.0*  PLT 274    Recent Labs  01/06/14 0457 01/07/14 0645  NA 140 142  K 4.0 4.1  CL 102 105  CO2 27 27  BUN 15 15  CREATININE 1.03 0.86  GLUCOSE 117* 94  CALCIUM 8.9 8.8   No results found for this basename: LABPT, INR,  in the last 72 hours  Assessment/Plan: Plan snf this week   [name] [name] 01/08/2014, 9:49 AM

## 2014-01-08 NOTE — Progress Notes (Signed)
Clinical Social Work Department CLINICAL SOCIAL WORK PLACEMENT NOTE 01/08/2014  Patient:  [name], [name]  Account Number:  [id number] Admit date:  01/04/2014  Clinical Social Worker:  Blima Rich, Latanya Presser  Date/time:  01/08/2014 06:32 PM  Clinical Social Work is seeking post-discharge placement for this patient at the following level of care:   [place]   (*CSW will update this form in Epic as items are completed)   01/08/2014  Patient/family provided with [facility] Department of Clinical Social Work's list of facilities offering this level of care within the geographic area requested by the patient (or if unable, by the patient's family).  01/08/2014  Patient/family informed of their freedom to choose among providers that offer the needed level of care, that participate in Medicare, Medicaid or managed care program needed by the patient, have an available bed and are willing to accept the patient.  01/08/2014  Patient/family informed of [facility]' ownership interest in [facility], as well as of the fact that they are under no obligation to receive care at this facility.  PASARR submitted to EDS on 10/07/2013 PASARR number received from [employer]DS on 10/07/2013  FL2 transmitted to all facilities in geographic area requested by pt/family on  01/08/2014 FL2 transmitted to all facilities within larger geographic area on   Patient informed that his/her managed care company has contracts with or will negotiate with  certain facilities, including the following:     Patient/family informed of bed offers received:   Patient chooses bed at  Physician recommends and patient chooses bed at    Patient to be transferred to  on   Patient to be transferred to facility by   The following physician request were entered in Epic:   Additional Comments: Patient has an [place].

## 2014-01-08 NOTE — Progress Notes (Signed)
Clinical Social Work Department BRIEF PSYCHOSOCIAL ASSESSMENT 01/08/2014  Patient:  NEVEN, FINA     Account Number:  [id number]     Admit date:  01/04/2014  Clinical Social Worker:  Rolinda Roan  Date/Time:  01/08/2014 06:34 PM  Referred by:  Physician  Date Referred:  01/07/2014 Referred for  SNF Placement   Other Referral:   Interview type:  Patient Other interview type:    PSYCHOSOCIAL DATA Living Status:  WIFE Admitted from facility:   Level of care:   Primary support name:  Yohannes Waibel [phone] Primary support relationship to patient:  SPOUSE Degree of support available:   Good support at bedside.    CURRENT CONCERNS  Other Concerns:    SOCIAL WORK ASSESSMENT / PLAN Clinical Social Worker (CSW) met with patient and his wife and daughter were at bedside. Patient reported that he would like to go to [facility] because he was just there one month ago. CSW explained that SNF will be a back up in case [facility] does not accept the patient. Patient verbalized his understanding. Wife reported that [facility] is a preference if patient does not go to [facility].   Assessment/plan status:  Psychosocial Support/Ongoing Assessment of Needs Other assessment/ plan:   Information/referral to community resources:   CSW gave wife SNF list.    PATIENT'S/FAMILY'S RESPONSE TO PLAN OF CARE: Patient and wife thanked CSW for visit and assisting with placement process.

## 2014-01-09 LAB — GLUCOSE, CAPILLARY
GLUCOSE-CAPILLARY: 144 mg/dL — AB (ref 70–99)
Glucose-Capillary: 215 mg/dL — ABNORMAL HIGH (ref 70–99)
Glucose-Capillary: 172 mg/dL — ABNORMAL HIGH (ref 70–99)
Glucose-Capillary: 248 mg/dL — ABNORMAL HIGH (ref 70–99)

## 2014-01-09 NOTE — Care Management Note (Signed)
01/09/14 Mathews, RN BSN Case Manager Case Manager spoke with Karene Fry, inpt rehab adm coordinator. She hasn't heard from [facility] with approval for CIR. Case Manager spoke with patient concerning need to continue process for SNF for shortterm rehab. Patient states will call [employer] himself, he was in [facility] last month and wants to return. Should this not work, he and wife want [facility]. Case manager informed social worker, Rhea Pink of this.

## 2014-01-09 NOTE — Progress Notes (Signed)
Subjective:  Patient reports no events.  Objective:   VITALS:   Filed Vitals:   01/08/14 1258 01/08/14 1654 01/08/14 2124 01/09/14 0600  BP: 105/57 117/58 120/60 91/48  Pulse: 79 87 87 78  Temp: 98.9 F (37.2 C)  98.6 F (37 C) 97.9 F (36.6 C)  TempSrc: Oral  Oral Oral  Resp: 20  19   Height:      Weight:      SpO2: 100%  100% 92%    Dressing c/d/i Foot in medaboot   Lab Results  Component Value Date   WBC 6.4 01/06/2014   HGB 9.0* 01/06/2014   HCT 28.0* 01/06/2014   MCV 73.5* 01/06/2014   PLT 274 01/06/2014     Assessment/Plan:  5 Days Post-Op   - CIR pending insurance clearance - will change dressing wed - continue with PT/OT  Problem List Items Addressed This Visit   None       [name] [name] Eduard Roux 01/09/2014, 7:34 AM [phone]

## 2014-01-09 NOTE — Progress Notes (Signed)
Rehab admissions - I have not heard back yet from [facility] regarding request for acute inpatient rehab admission.  I anticipate getting a denial from [place].  I will update all once I hear back from insurance case manager.  Call me for questions.  #[phone]

## 2014-01-09 NOTE — Progress Notes (Signed)
Rehab admissions - Evaluated for possible admission.  I met with patient and he would like inpatient rehab here at [facility].  I have called his insurance carrier, opened the case and have faxed information to them.  I will update all once I hear back from insurance case manager.  Call me for questions.  #[phone]

## 2014-01-09 NOTE — Progress Notes (Signed)
Physical Therapy Treatment Patient Details Name: [name] [name] MRN: [id number] DOB: 11/17/1945 Today's Date: 01/09/2014    History of Present Illness 68 y.o. 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 Hx of diabetes, PVD and recent underwent left transtibial amputation    PT Comments    Patient having a difficult time with standing balance this session due to what appeared to be fatigue. Patient had gotten up with OT earlier and had just finished off bathing. Patient with heavy posterior lean in standing. Unable to attempt hop on R heel this session. Continue to recommend comprehensive inpatient rehab (CIR) for post-acute therapy needs.   Follow Up Recommendations  CIR;Supervision/Assistance - 24 hour     Equipment Recommendations  None recommended by PT    Recommendations for Other Services       Precautions / Restrictions Precautions Precautions: Fall Precaution Comments: Pt remains unsteady on his feet even w walker and assist. Required Braces or Orthoses: Other Brace/Splint Other Brace/Splint: postop shoe Restrictions Weight Bearing Restrictions: Yes RLE Weight Bearing: Weight bearing as tolerated LLE Weight Bearing: Non weight bearing Other Position/Activity Restrictions: pt must weight bear thru R heel.    Mobility  Bed Mobility Overal bed mobility: Needs Assistance Bed Mobility: Supine to Sit     Supine to sit: Min assist     General bed mobility comments: Patient has gotten OOB with OT. See OT notes for assistance levels  Transfers Overall transfer level: Needs assistance Equipment used: Rolling walker (2 wheeled) Transfers: Sit to/from [facility] Sit to Stand: Mod assist Stand pivot transfers: Mod assist       General transfer comment: cues for hand placement with sit/stand transfers.Mod A to shift weight anteriorly as patient with significant posterior lean especially when attempting to grab onto walker with R hand  from recliner of armrest. Sit to stand x3. Unable to ambulate this session due to poor balance and safety  Ambulation/Gait                 Stairs            Wheelchair Mobility    Modified Rankin (Stroke Patients Only)       Balance Overall balance assessment: Needs assistance Sitting-balance support: Feet supported Sitting balance-Leahy Scale: Fair Sitting balance - Comments: able to sit with foot supported on floor and no UE support   Standing balance support: During functional activity;Bilateral upper extremity supported Standing balance-Leahy Scale: Poor Standing balance comment: Must have use of RW and outside assist to maintain balance without risk of falling                    Cognition Arousal/Alertness: Awake/alert Behavior During Therapy: WFL for tasks assessed/performed Overall Cognitive Status: Within Functional Limits for tasks assessed                      Exercises Amputee Exercises Hip ABduction/ADduction: AROM;Left;10 reps;Seated Knee Flexion: AROM;Left;10 reps;Seated Knee Extension: AROM;Seated;Left;10 reps Straight Leg Raises: AROM;Seated;Left;10 reps Chair Push Up:  (attempted but difficult)    General Comments        Pertinent Vitals/Pain no apparent distress     Home Living                      Prior Function            PT Goals (current goals can now be found in the care plan section) Acute Rehab  PT Goals Patient Stated Goal: get better and go home Progress towards PT goals: Progressing toward goals    Frequency  Min 3X/week    PT Plan Current plan remains appropriate    Co-evaluation             End of Session Equipment Utilized During Treatment: Gait belt Activity Tolerance: Patient limited by fatigue Patient left: in chair;with call bell/phone within reach     Time: 1103-1130 PT Time Calculation (min): 27 min  Charges:  $Therapeutic Exercise: 8-22 mins $Therapeutic Activity:  8-22 mins                    G Codes:      Tonia Brooms Robinette 01/09/2014, 12:12 PM 01/09/2014 [place] PTA [phone] pager [phone] office

## 2014-01-09 NOTE — Progress Notes (Signed)
Inpatient Diabetes Program Recommendations  AACE/ADA: New Consensus Statement on Inpatient Glycemic Control (2013)  Target Ranges:  Prepandial:   less than 140 mg/dL      Peak postprandial:   less than 180 mg/dL (1-2 hours)      Critically ill patients:  140 - 180 mg/dL   Reason for Assessment: Hyperglycemia  Diabetes history: Type 2 diabetes Outpatient Diabetes medications: Levemir 35 units at HS, Novolog 4 units prn CBG> 145 mg/dl, Glucophage 500 mg bid Current orders for Inpatient glycemic control: Levemir 35 units at HS, Moderate Novolog correction tid with meals and HS scale, Novolog 4 units prn CBG greater than 145 mg/dl daily  Note:  Request MD change the Novolog 4 units prn CBG greater than 145 mg/dl daily to tid with meals-- to be held if eats less than 50%.  Thank you.  Vicky Mccanless S. Marcelline Mates, RN, CNS, CDE Inpatient Diabetes Program, team pager [phone]

## 2014-01-09 NOTE — Progress Notes (Signed)
Occupational Therapy Treatment Patient Details Name: [name] [name] MRN: [id number] DOB: 23-May-1946 Today's Date: 01/09/2014    History of present illness 68 y.o. 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 Hx of diabetes, PVD and recent underwent left transtibial amputation   OT comments  Pt slowly progressing with adls and adl mobility.  Definitely feel pt needs rehab before d/cing back home. Pt has great difficulty letting go of the walker to complete any adls in standing at this point due to poor standing balance.  Pt is very motivated and progressing well.  Follow Up Recommendations  CIR;Supervision/Assistance - 24 hour    Equipment Recommendations       Recommendations for Other Services      Precautions / Restrictions Precautions Precautions: Fall Precaution Comments: Pt remains unsteady on his feet even w walker and assist. Required Braces or Orthoses: Other Brace/Splint Other Brace/Splint: postop shoe Restrictions Weight Bearing Restrictions: Yes RLE Weight Bearing: Weight bearing as tolerated LLE Weight Bearing: Non weight bearing Other Position/Activity Restrictions: pt must weight bear thru R heel.       Mobility Bed Mobility Overal bed mobility: Needs Assistance Bed Mobility: Supine to Sit     Supine to sit: Min assist     General bed mobility comments: Min assist for elevation of trunk into seated position at edge of bed with bed flat and no rails used. cues needed for technique  Transfers Overall transfer level: Needs assistance Equipment used: Rolling walker (2 wheeled) Transfers: Sit to/from [facility] Sit to Stand: Min assist Stand pivot transfers: Mod assist       General transfer comment: cues for hand placement with sit/stand transfers.cues for posture, walker use and to "bear down" on walker to lift and move right leg with stand pivot transfer.    Balance Overall balance assessment: Needs  assistance Sitting-balance support: Feet supported Sitting balance-Leahy Scale: Fair Sitting balance - Comments: able to sit with foot supported on floor and no UE support   Standing balance support: During functional activity;Bilateral upper extremity supported Standing balance-Leahy Scale: Zero Standing balance comment: Pt must have walker and outside assist to maintain standing w/o risk of falling.                   ADL Overall ADL's : Needs assistance/impaired Eating/Feeding: Independent;Sitting   Grooming: Set up;Sitting   Upper Body Bathing: Sitting;Set up   Lower Body Bathing: Sit to/from stand;Moderate assistance Lower Body Bathing Details (indicate cue type and reason): Pt requires min assist to get into standing and then mod assist to let go and assist with washing peri areas. Upper Body Dressing : Supervision/safety;Sitting   Lower Body Dressing: Moderate assistance;Sit to/from stand Lower Body Dressing Details (indicate cue type and reason): pt was able to cross leg over to doff boot this morning with encouragement.  Pt donned shorts with mod assist to stand and maintain balance while pulling them up.  Pt has very difficulty time letting go of the walker to manage things with dominant hand. Toilet Transfer: Minimal Production assistant, radio Details (indicate cue type and reason): Pt uses RW and requires mod assist to weak side and min assist to strong side. Toileting- Clothing Manipulation and Hygiene: Moderate assistance;Sit to/from stand Toileting - Clothing Manipulation Details (indicate cue type and reason): Pt had difficulty maintaining standing to clean self.     Functional mobility during ADLs: Rolling walker;Moderate assistance General ADL Comments: Pt was more motivated to do adls out  of bed today and agreed to bathing and working on adls in standing.  pt required min assist at times to maintain standing but more often needed mod assist.  pt  has difficulty keeping weight in the heels.      Vision                     Perception     Praxis      Cognition   Behavior During Therapy: [facility] for tasks assessed/performed Overall Cognitive Status: Within Functional Limits for tasks assessed                       Extremity/Trunk Assessment               Exercises     Shoulder Instructions       General Comments      Pertinent Vitals/ Pain       Pt pain level at 4/10. Nursing aware.  Pt repositioned and up in chair.  Home Living                                          Prior Functioning/Environment              Frequency Min 2X/week     Progress Toward Goals  OT Goals(current goals can now be found in the care plan section)  Progress towards OT goals: Progressing toward goals  Acute Rehab OT Goals Patient Stated Goal: get better and go home OT Goal Formulation: With patient Time For Goal Achievement: 01/12/14 Potential to Achieve Goals: Good ADL Goals Pt Will Perform Grooming: standing;with min guard assist Pt Will Perform Lower Body Bathing: with min guard assist;with adaptive equipment;sit to/from stand Pt Will Perform Lower Body Dressing: with min guard assist;with adaptive equipment;sit to/from stand Pt Will Transfer to Toilet: with min guard assist;stand pivot transfer;bedside commode Additional ADL Goal #1: Pt will perform bed mobility with min guard assist to prepare for ADLs.  Plan Discharge plan remains appropriate    Co-evaluation                 End of Session Equipment Utilized During Treatment: Rolling walker   Activity Tolerance Patient limited by fatigue   Patient Left in chair;with call bell/phone within reach;with family/visitor present   Nurse Communication Mobility status        Time: 0921-1003 OT Time Calculation (min): 42 min  Charges: OT General Charges $OT Visit: 1 Procedure OT Treatments $Self Care/Home Management :  38-52 mins  Vickki Muff 01/09/2014, 11:12 AM [phone]

## 2014-01-10 LAB — GLUCOSE, CAPILLARY
GLUCOSE-CAPILLARY: 250 mg/dL — AB (ref 70–99)
GLUCOSE-CAPILLARY: 244 mg/dL — AB (ref 70–99)
Glucose-Capillary: 194 mg/dL — ABNORMAL HIGH (ref 70–99)
Glucose-Capillary: 218 mg/dL — ABNORMAL HIGH (ref 70–99)

## 2014-01-10 NOTE — Progress Notes (Signed)
Inpatient Diabetes Program Recommendations  AACE/ADA: New Consensus Statement on Inpatient Glycemic Control (2013)  Target Ranges:  Prepandial:   less than 140 mg/dL      Peak postprandial:   less than 180 mg/dL (1-2 hours)      Critically ill patients:  140 - 180 mg/dL   Reason for Assessment: Hyperglycemia   Diabetes history: Type 2 diabetes  Outpatient Diabetes medications: Levemir 35 units at HS, Novolog 4 units prn CBG> 145 mg/dl, Glucophage 500 mg bid  Current orders for Inpatient glycemic control: Levemir 35 units at HS, Moderate Novolog correction tid with meals and HS scale, Novolog 4 units prn CBG greater than 145 mg/dl daily   Results for [name] [name], [name] [name] (MRN [id number]) as of 01/10/2014 10:08  Ref. Range 01/09/2014 06:31 01/09/2014 11:23 01/09/2014 16:23 01/09/2014 22:18 01/10/2014 06:39  Glucose-Capillary Latest Range: 70-99 mg/dL 144 (H) 215 (H) 172 (H) 248 (H) 194 (H)   Note:  Request again that MD change the Novolog 4 units prn CBG greater than 145 mg/dl daily to tid with meals-- to be held if eats less than 50%. Thank you. Loyce Flaming S. Marcelline Mates, RN, CNS, CDE Inpatient Diabetes Program, team pager [phone]

## 2014-01-10 NOTE — Discharge Instructions (Signed)
Resume plavix. Do not change dressing.  It will be changed at your first follow up appointment.

## 2014-01-10 NOTE — Progress Notes (Signed)
Rehab admissions - I have received a denial from insurance carrier for acute inpatient rehab admission.  Options will be SNF or HH therapies.  Call me for questions.  #[phone]

## 2014-01-10 NOTE — Progress Notes (Signed)
Subjective:  Patient reports no events.  Objective:   VITALS:   Filed Vitals:   01/09/14 0600 01/09/14 1430 01/09/14 2022 01/10/14 0648  BP: 91/48 114/62 112/55 114/64  Pulse: 78 92 91 94  Temp: 97.9 F (36.6 C) 98.8 F (37.1 C) 98.7 F (37.1 C) 98.5 F (36.9 C)  TempSrc: Oral  Oral Oral  Resp:  16 18 18   Height:      Weight:      SpO2: 92% 99% 100% 98%    Dressing c/d/i Foot in medaboot   Lab Results  Component Value Date   WBC 6.4 01/06/2014   HGB 9.0* 01/06/2014   HCT 28.0* 01/06/2014   MCV 73.5* 01/06/2014   PLT 274 01/06/2014     Assessment/Plan:  6 Days Post-Op   - CIR pending insurance clearance - will change dressing wed - continue with PT/OT  Problem List Items Addressed This Visit   None       [name] [name] Eduard Roux 01/10/2014, 7:27 AM [phone]

## 2014-01-10 NOTE — Progress Notes (Signed)
Patient's wife wants to bring patient home with [facility]. Clinical Social Worker will sign off for now as social work intervention is no longer needed. Please consult [place] again if new need arises.   Rhea Pink, MSW, [place]

## 2014-01-11 LAB — GLUCOSE, CAPILLARY
GLUCOSE-CAPILLARY: 235 mg/dL — AB (ref 70–99)
Glucose-Capillary: 188 mg/dL — ABNORMAL HIGH (ref 70–99)

## 2014-01-11 NOTE — Progress Notes (Signed)
Agree with PTA.    Leyli Kevorkian, PT [phone]  

## 2014-01-11 NOTE — Progress Notes (Addendum)
Subjective:  Patient reports no events.  Objective:   VITALS:   Filed Vitals:   01/10/14 0648 01/10/14 1449 01/10/14 1952 01/11/14 0431  BP: 114/64 123/55 125/52 119/62  Pulse: 94 87 95 85  Temp: 98.5 F (36.9 C) 98.2 F (36.8 C) 98.8 F (37.1 C) 98.9 F (37.2 C)  TempSrc: Oral Oral Oral   Resp: 18 18 16 16   Height:      Weight:      SpO2: 98% 99% 99% 100%    Incision c/d/i Foot in medaboot   Lab Results  Component Value Date   WBC 6.4 01/06/2014   HGB 9.0* 01/06/2014   HCT 28.0* 01/06/2014   MCV 73.5* 01/06/2014   PLT 274 01/06/2014     Assessment/Plan:  7 Days Post-Op   - CIR denied by insurance, will dc home with home health services per patient and wife's preference - dressing changed - dc home today - f/u 1 week for wound check  Problem List Items Addressed This Visit     Musculoskeletal and Integument   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 - Primary   Relevant Medications      ceFAZolin (ANCEF) IVPB 2 g/50 mL premix (Completed)      ceFAZolin (ANCEF) IVPB 2 g/50 mL premix (Completed)   Other Relevant Orders      Call MD / Call 911      Diet - low sodium heart healthy      Constipation Prevention      Increase activity slowly as tolerated      Driving restrictions   Osteomyelitis of toe of right foot   Relevant Medications      ceFAZolin (ANCEF) IVPB 2 g/50 mL premix (Completed)      ceFAZolin (ANCEF) IVPB 2 g/50 mL premix (Completed)   Other Relevant Orders      Call MD / Call 911      Diet - low sodium heart healthy      Constipation Prevention      Increase activity slowly as tolerated      Driving restrictions       Morrill Bomkamp Eduard Roux 01/11/2014, 7:17 AM [phone]

## 2014-01-11 NOTE — Progress Notes (Signed)
Physical Therapy Treatment Patient Details Name: [name] [name] MRN: [id number] DOB: August 04, 1946 Today's Date: 01/11/2014    History of Present Illness 68 y.o. 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 Hx of diabetes, PVD and recent underwent left transtibial amputation    PT Comments    Patient was seen quickly prior to DC home to ensure patient was safe with transfer. Wife was present and was familiar with technique from patient previous BKA. Patient appeared stronger this session and able to transfer with Min A. Recommend HHPT with supervision. Wife has someone that is staying with patient while she is at work.   Follow Up Recommendations  Home health PT;Supervision/Assistance - 24 hour     Equipment Recommendations  None recommended by PT    Recommendations for Other Services       Precautions / Restrictions Precautions Precautions: Fall Required Braces or Orthoses: Other Brace/Splint Other Brace/Splint: postop shoe Restrictions Weight Bearing Restrictions: Yes RLE Weight Bearing: Weight bearing as tolerated LLE Weight Bearing: Non weight bearing Other Position/Activity Restrictions: pt must weight bear thru R heel.    Mobility  Bed Mobility         Supine to sit: Supervision        Transfers       Sit to Stand: Min assist Stand pivot transfers: Min assist       General transfer comment: Patient cued for R foot placement prior to standing and correct placement of WC/chair to bed to ease SPT. Min A to ensure balance but patient much better balance than previous session. Wife present throughout and familiar with technique from previous BKA.   Ambulation/Gait                 Stairs            Wheelchair Mobility    Modified Rankin (Stroke Patients Only)       Balance                                    Cognition Arousal/Alertness: Awake/alert Behavior During Therapy: WFL for tasks  assessed/performed Overall Cognitive Status: Within Functional Limits for tasks assessed                      Exercises      General Comments        Pertinent Vitals/Pain no apparent distress     Home Living                      Prior Function            PT Goals (current goals can now be found in the care plan section) Progress towards PT goals: Progressing toward goals    Frequency       PT Plan Discharge plan needs to be updated    Co-evaluation             End of Session Equipment Utilized During Treatment: Gait belt Activity Tolerance: Patient tolerated treatment well Patient left: in chair;with call bell/phone within reach;with family/visitor present     Time: 6237-6283 PT Time Calculation (min): 13 min  Charges:  $Therapeutic Activity: 8-22 mins                    G Codes:      Tonia Brooms Robinette 01/11/2014, 11:25 AM  01/11/2014 Gregary Signs  Benjamine Mola Robinette PTA [phone] pager [phone] office

## 2014-01-17 ENCOUNTER — Other Ambulatory Visit: Payer: Self-pay | Admitting: Internal Medicine

## 2014-01-22 ENCOUNTER — Other Ambulatory Visit (HOSPITAL_COMMUNITY): Payer: Self-pay | Admitting: Physical Medicine and Rehabilitation

## 2014-01-24 ENCOUNTER — Other Ambulatory Visit (HOSPITAL_COMMUNITY): Payer: Self-pay | Admitting: Physical Medicine and Rehabilitation

## 2014-01-27 ENCOUNTER — Other Ambulatory Visit (HOSPITAL_COMMUNITY): Payer: Self-pay | Admitting: Orthopaedic Surgery

## 2014-01-27 ENCOUNTER — Encounter (HOSPITAL_COMMUNITY): Payer: Self-pay | Admitting: Pharmacy Technician

## 2014-01-27 ENCOUNTER — Encounter (HOSPITAL_COMMUNITY): Payer: Self-pay | Admitting: *Deleted

## 2014-01-27 NOTE — Progress Notes (Signed)
01/27/14 1524  OBSTRUCTIVE SLEEP APNEA  Have you ever been diagnosed with sleep apnea through a sleep study? No  Do you snore loudly (loud enough to be heard through closed doors)?  1  Do you often feel tired, fatigued, or sleepy during the daytime? 0  Has anyone observed you stop breathing during your sleep? 1  Do you have, or are you being treated for high blood pressure? 1  BMI more than 35 kg/m2? 0  Age over 70 years old? 1  Gender: 1  Obstructive Sleep Apnea Score 5

## 2014-01-29 MED ORDER — CEFAZOLIN SODIUM-DEXTROSE 2-3 GM-% IV SOLR
2.0000 g | INTRAVENOUS | Status: AC
  Administered 2014-01-30: 2 g via INTRAVENOUS
  Filled 2014-01-29: qty 50

## 2014-01-30 ENCOUNTER — Encounter (HOSPITAL_COMMUNITY): Payer: Self-pay | Admitting: *Deleted

## 2014-01-30 ENCOUNTER — Encounter (HOSPITAL_COMMUNITY): Payer: Medicare Other | Admitting: Certified Registered"

## 2014-01-30 ENCOUNTER — Inpatient Hospital Stay (HOSPITAL_COMMUNITY): Payer: Medicare Other | Admitting: Certified Registered"

## 2014-01-30 ENCOUNTER — Encounter (HOSPITAL_COMMUNITY)
Admission: RE | Disposition: A | Discharge status: Rehabilitation Facility | Payer: Self-pay | Source: Ambulatory Visit | Attending: Orthopaedic Surgery

## 2014-01-30 ENCOUNTER — Inpatient Hospital Stay (HOSPITAL_COMMUNITY): Payer: Medicare Other

## 2014-01-30 ENCOUNTER — Inpatient Hospital Stay (HOSPITAL_COMMUNITY)
Admission: RE | Admit: 2014-01-30 | Discharge: 2014-02-02 | DRG: 617 | Disposition: A | Discharge status: Rehabilitation Facility | Payer: Medicare Other | Source: Ambulatory Visit | Attending: Orthopaedic Surgery | Admitting: Orthopaedic Surgery

## 2014-01-30 DIAGNOSIS — I509 Heart failure, unspecified: Secondary | ICD-10-CM | POA: Diagnosis present

## 2014-01-30 DIAGNOSIS — E1142 Type 2 diabetes mellitus with diabetic polyneuropathy: Secondary | ICD-10-CM | POA: Diagnosis present

## 2014-01-30 DIAGNOSIS — S88119A Complete traumatic amputation at level between knee and ankle, unspecified lower leg, initial encounter: Secondary | ICD-10-CM

## 2014-01-30 DIAGNOSIS — I70229 Atherosclerosis of native arteries of extremities with rest pain, unspecified extremity: Secondary | ICD-10-CM

## 2014-01-30 DIAGNOSIS — I2510 Atherosclerotic heart disease of native coronary artery without angina pectoris: Secondary | ICD-10-CM | POA: Diagnosis present

## 2014-01-30 DIAGNOSIS — Z9861 Coronary angioplasty status: Secondary | ICD-10-CM

## 2014-01-30 DIAGNOSIS — E785 Hyperlipidemia, unspecified: Secondary | ICD-10-CM | POA: Diagnosis present

## 2014-01-30 DIAGNOSIS — M869 Osteomyelitis, unspecified: Secondary | ICD-10-CM

## 2014-01-30 DIAGNOSIS — Z7902 Long term (current) use of antithrombotics/antiplatelets: Secondary | ICD-10-CM

## 2014-01-30 DIAGNOSIS — I4891 Unspecified atrial fibrillation: Secondary | ICD-10-CM | POA: Diagnosis present

## 2014-01-30 DIAGNOSIS — S98139A Complete traumatic amputation of one unspecified lesser toe, initial encounter: Secondary | ICD-10-CM

## 2014-01-30 DIAGNOSIS — E1169 Type 2 diabetes mellitus with other specified complication: Principal | ICD-10-CM | POA: Diagnosis present

## 2014-01-30 DIAGNOSIS — Z7982 Long term (current) use of aspirin: Secondary | ICD-10-CM

## 2014-01-30 DIAGNOSIS — L97509 Non-pressure chronic ulcer of other part of unspecified foot with unspecified severity: Secondary | ICD-10-CM | POA: Diagnosis present

## 2014-01-30 DIAGNOSIS — E669 Obesity, unspecified: Secondary | ICD-10-CM | POA: Diagnosis present

## 2014-01-30 DIAGNOSIS — I998 Other disorder of circulatory system: Secondary | ICD-10-CM

## 2014-01-30 DIAGNOSIS — E1149 Type 2 diabetes mellitus with other diabetic neurological complication: Secondary | ICD-10-CM | POA: Diagnosis present

## 2014-01-30 DIAGNOSIS — M9080 Osteopathy in diseases classified elsewhere, unspecified site: Secondary | ICD-10-CM | POA: Diagnosis present

## 2014-01-30 DIAGNOSIS — Z79899 Other long term (current) drug therapy: Secondary | ICD-10-CM

## 2014-01-30 DIAGNOSIS — Z95810 Presence of automatic (implantable) cardiac defibrillator: Secondary | ICD-10-CM

## 2014-01-30 DIAGNOSIS — D62 Acute posthemorrhagic anemia: Secondary | ICD-10-CM | POA: Diagnosis not present

## 2014-01-30 DIAGNOSIS — I10 Essential (primary) hypertension: Secondary | ICD-10-CM | POA: Diagnosis present

## 2014-01-30 DIAGNOSIS — I428 Other cardiomyopathies: Secondary | ICD-10-CM | POA: Diagnosis present

## 2014-01-30 DIAGNOSIS — M86679 Other chronic osteomyelitis, unspecified ankle and foot: Secondary | ICD-10-CM | POA: Diagnosis present

## 2014-01-30 DIAGNOSIS — M86671 Other chronic osteomyelitis, right ankle and foot: Secondary | ICD-10-CM

## 2014-01-30 DIAGNOSIS — Z794 Long term (current) use of insulin: Secondary | ICD-10-CM

## 2014-01-30 DIAGNOSIS — I739 Peripheral vascular disease, unspecified: Secondary | ICD-10-CM | POA: Diagnosis present

## 2014-01-30 DIAGNOSIS — I5022 Chronic systolic (congestive) heart failure: Secondary | ICD-10-CM | POA: Diagnosis present

## 2014-01-30 DIAGNOSIS — F0390 Unspecified dementia without behavioral disturbance: Secondary | ICD-10-CM | POA: Diagnosis present

## 2014-01-30 DIAGNOSIS — K219 Gastro-esophageal reflux disease without esophagitis: Secondary | ICD-10-CM | POA: Diagnosis present

## 2014-01-30 HISTORY — DX: Adverse effect of unspecified anesthetic, initial encounter: T41.45XA

## 2014-01-30 HISTORY — PX: AMPUTATION: SHX166

## 2014-01-30 HISTORY — DX: Other complications of anesthesia, initial encounter: T88.59XA

## 2014-01-30 HISTORY — DX: Other specified postprocedural states: R11.2

## 2014-01-30 HISTORY — DX: Nausea with vomiting, unspecified: Z98.890

## 2014-01-30 LAB — CBC
HCT: 30.7 % — ABNORMAL LOW (ref 39.0–52.0)
Hemoglobin: 9.9 g/dL — ABNORMAL LOW (ref 13.0–17.0)
MCH: 23.2 pg — AB (ref 26.0–34.0)
MCHC: 32.2 g/dL (ref 30.0–36.0)
MCV: 71.9 fL — ABNORMAL LOW (ref 78.0–100.0)
PLATELETS: 353 10*3/uL (ref 150–400)
RBC: 4.27 MIL/uL (ref 4.22–5.81)
RDW: 17.9 % — ABNORMAL HIGH (ref 11.5–15.5)
WBC: 7.7 10*3/uL (ref 4.0–10.5)

## 2014-01-30 LAB — BASIC METABOLIC PANEL
BUN: 15 mg/dL (ref 6–23)
CALCIUM: 9.5 mg/dL (ref 8.4–10.5)
CO2: 23 meq/L (ref 19–32)
Chloride: 105 mEq/L (ref 96–112)
Creatinine, Ser: 0.91 mg/dL (ref 0.50–1.35)
GFR calc Af Amer: >90 mL/min (ref >90)
GFR calc non Af Amer: 84 mL/min — ABNORMAL LOW (ref >90)
Glucose, Bld: 112 mg/dL — ABNORMAL HIGH (ref 70–99)
Potassium: 4.1 mEq/L (ref 3.7–5.3)
SODIUM: 144 meq/L (ref 137–147)

## 2014-01-30 LAB — GLUCOSE, CAPILLARY
GLUCOSE-CAPILLARY: 104 mg/dL — AB (ref 70–99)
GLUCOSE-CAPILLARY: 116 mg/dL — AB (ref 70–99)
Glucose-Capillary: 112 mg/dL — ABNORMAL HIGH (ref 70–99)
Glucose-Capillary: 169 mg/dL — ABNORMAL HIGH (ref 70–99)

## 2014-01-30 SURGERY — AMPUTATION BELOW KNEE
Anesthesia: General | Site: Leg Lower | Laterality: Right

## 2014-01-30 MED ORDER — HYDROCODONE-ACETAMINOPHEN 5-325 MG PO TABS: 1.0000 | ORAL_TABLET | ORAL | Status: DC | PRN

## 2014-01-30 MED ORDER — CLOPIDOGREL BISULFATE 75 MG PO TABS
75.0000 mg | ORAL_TABLET | Freq: Every day | ORAL | Status: DC
  Administered 2014-01-31 – 2014-02-02 (×3): 75 mg via ORAL
  Filled 2014-01-30 (×4): qty 1

## 2014-01-30 MED ORDER — VITAMIN E 45 MG (100 UNIT) PO CAPS
1000.0000 [IU] | ORAL_CAPSULE | Freq: Every day | ORAL | Status: DC
  Administered 2014-01-30 – 2014-02-02 (×3): 1000 [IU] via ORAL
  Filled 2014-01-30 (×4): qty 2

## 2014-01-30 MED ORDER — INSULIN ASPART 100 UNIT/ML ~~LOC~~ SOLN: 4.0000 [IU] | Freq: Two times a day (BID) | SUBCUTANEOUS | Status: DC | PRN

## 2014-01-30 MED ORDER — MORPHINE SULFATE 2 MG/ML IJ SOLN
1.0000 mg | INTRAMUSCULAR | Status: DC | PRN
  Administered 2014-01-30 – 2014-01-31 (×2): 1 mg via INTRAVENOUS
  Filled 2014-01-30 (×2): qty 1

## 2014-01-30 MED ORDER — POTASSIUM CHLORIDE CRYS ER 20 MEQ PO TBCR
20.0000 meq | EXTENDED_RELEASE_TABLET | Freq: Two times a day (BID) | ORAL | Status: DC
  Administered 2014-01-30 – 2014-02-02 (×6): 20 meq via ORAL
  Filled 2014-01-30 (×7): qty 1

## 2014-01-30 MED ORDER — OXYCODONE HCL 5 MG PO TABS
ORAL_TABLET | ORAL | Status: AC
  Administered 2014-01-30: 10 mg via ORAL
  Filled 2014-01-30: qty 2

## 2014-01-30 MED ORDER — POLYETHYLENE GLYCOL 3350 17 G PO PACK: 17.0000 g | PACK | Freq: Every day | ORAL | Status: DC | PRN

## 2014-01-30 MED ORDER — SENNA 8.6 MG PO TABS: 1.0000 | ORAL_TABLET | Freq: Two times a day (BID) | ORAL | Status: DC

## 2014-01-30 MED ORDER — ONDANSETRON HCL 4 MG/2ML IJ SOLN
4.0000 mg | Freq: Four times a day (QID) | INTRAMUSCULAR | Status: DC | PRN
  Administered 2014-01-31: 4 mg via INTRAVENOUS
  Filled 2014-01-30: qty 2

## 2014-01-30 MED ORDER — LACTATED RINGERS IV SOLN
INTRAVENOUS | Status: DC
  Administered 2014-01-30 (×2): via INTRAVENOUS

## 2014-01-30 MED ORDER — DIPHENHYDRAMINE HCL 12.5 MG/5ML PO ELIX: 25.0000 mg | ORAL_SOLUTION | ORAL | Status: DC | PRN

## 2014-01-30 MED ORDER — POLYSACCHARIDE IRON COMPLEX 150 MG PO CAPS
150.0000 mg | ORAL_CAPSULE | Freq: Every day | ORAL | Status: DC
  Administered 2014-01-30 – 2014-02-02 (×4): 150 mg via ORAL
  Filled 2014-01-30 (×4): qty 1

## 2014-01-30 MED ORDER — INSULIN ASPART 100 UNIT/ML ~~LOC~~ SOLN
0.0000 [IU] | Freq: Three times a day (TID) | SUBCUTANEOUS | Status: DC
  Administered 2014-01-31: 3 [IU] via SUBCUTANEOUS
  Administered 2014-01-31: 2 [IU] via SUBCUTANEOUS
  Administered 2014-02-01: 5 [IU] via SUBCUTANEOUS
  Administered 2014-02-01 (×2): 2 [IU] via SUBCUTANEOUS
  Administered 2014-02-02: 3 [IU] via SUBCUTANEOUS
  Administered 2014-02-02: 2 [IU] via SUBCUTANEOUS

## 2014-01-30 MED ORDER — HYDROCODONE-ACETAMINOPHEN 5-325 MG PO TABS
1.0000 | ORAL_TABLET | ORAL | Status: DC | PRN
  Administered 2014-01-30 – 2014-01-31 (×2): 2 via ORAL
  Filled 2014-01-30 (×2): qty 2

## 2014-01-30 MED ORDER — FENTANYL CITRATE 0.05 MG/ML IJ SOLN
INTRAMUSCULAR | Status: AC
  Administered 2014-01-30: 25 ug via INTRAVENOUS
  Filled 2014-01-30: qty 2

## 2014-01-30 MED ORDER — INSULIN DETEMIR 100 UNIT/ML ~~LOC~~ SOLN
35.0000 [IU] | Freq: Every day | SUBCUTANEOUS | Status: DC
  Administered 2014-01-30 – 2014-02-01 (×3): 35 [IU] via SUBCUTANEOUS
  Filled 2014-01-30 (×4): qty 0.35

## 2014-01-30 MED ORDER — METHOCARBAMOL 1000 MG/10ML IJ SOLN
500.0000 mg | Freq: Four times a day (QID) | INTRAVENOUS | Status: DC | PRN
  Filled 2014-01-30: qty 5

## 2014-01-30 MED ORDER — SPIRONOLACTONE 12.5 MG HALF TABLET
12.5000 mg | ORAL_TABLET | Freq: Every day | ORAL | Status: DC
  Administered 2014-01-30 – 2014-02-01 (×3): 12.5 mg via ORAL
  Filled 2014-01-30 (×4): qty 1

## 2014-01-30 MED ORDER — INSULIN ASPART 100 UNIT/ML ~~LOC~~ SOLN
0.0000 [IU] | Freq: Every day | SUBCUTANEOUS | Status: DC
  Administered 2014-01-31: 3 [IU] via SUBCUTANEOUS
  Administered 2014-02-01: 2 [IU] via SUBCUTANEOUS

## 2014-01-30 MED ORDER — PROPOFOL 10 MG/ML IV BOLUS
INTRAVENOUS | Status: AC
  Filled 2014-01-30: qty 20

## 2014-01-30 MED ORDER — LIDOCAINE HCL (CARDIAC) 20 MG/ML IV SOLN
INTRAVENOUS | Status: DC | PRN
  Administered 2014-01-30: 100 mg via INTRAVENOUS

## 2014-01-30 MED ORDER — ONDANSETRON HCL 4 MG/2ML IJ SOLN
INTRAMUSCULAR | Status: AC
  Filled 2014-01-30: qty 2

## 2014-01-30 MED ORDER — ONDANSETRON HCL 4 MG PO TABS: 4.0000 mg | ORAL_TABLET | Freq: Four times a day (QID) | ORAL | Status: DC | PRN

## 2014-01-30 MED ORDER — MAGNESIUM CITRATE PO SOLN: 1.0000 | Freq: Once | ORAL | Status: AC | PRN

## 2014-01-30 MED ORDER — METOCLOPRAMIDE HCL 5 MG/ML IJ SOLN
5.0000 mg | Freq: Three times a day (TID) | INTRAMUSCULAR | Status: DC | PRN
  Administered 2014-01-30: 10 mg via INTRAVENOUS
  Filled 2014-01-30: qty 2

## 2014-01-30 MED ORDER — ASPIRIN EC 325 MG PO TBEC
325.0000 mg | DELAYED_RELEASE_TABLET | Freq: Every day | ORAL | Status: DC
  Administered 2014-01-30 – 2014-02-02 (×4): 325 mg via ORAL
  Filled 2014-01-30 (×4): qty 1

## 2014-01-30 MED ORDER — ATORVASTATIN CALCIUM 10 MG PO TABS
10.0000 mg | ORAL_TABLET | Freq: Every day | ORAL | Status: DC
  Administered 2014-01-30 – 2014-02-02 (×4): 10 mg via ORAL
  Filled 2014-01-30 (×4): qty 1

## 2014-01-30 MED ORDER — FENTANYL CITRATE 0.05 MG/ML IJ SOLN
INTRAMUSCULAR | Status: AC
  Filled 2014-01-30: qty 5

## 2014-01-30 MED ORDER — CEFAZOLIN SODIUM-DEXTROSE 2-3 GM-% IV SOLR
2.0000 g | Freq: Four times a day (QID) | INTRAVENOUS | Status: AC
  Administered 2014-01-30 – 2014-01-31 (×3): 2 g via INTRAVENOUS
  Filled 2014-01-30 (×3): qty 50

## 2014-01-30 MED ORDER — PROPOFOL 10 MG/ML IV BOLUS
INTRAVENOUS | Status: DC | PRN
  Administered 2014-01-30: 30 mg via INTRAVENOUS
  Administered 2014-01-30: 140 mg via INTRAVENOUS

## 2014-01-30 MED ORDER — LIDOCAINE HCL (CARDIAC) 20 MG/ML IV SOLN
INTRAVENOUS | Status: AC
  Filled 2014-01-30: qty 5

## 2014-01-30 MED ORDER — PREGABALIN 75 MG PO CAPS
75.0000 mg | ORAL_CAPSULE | Freq: Two times a day (BID) | ORAL | Status: DC
  Administered 2014-01-30 – 2014-02-02 (×6): 75 mg via ORAL
  Filled 2014-01-30 (×6): qty 1

## 2014-01-30 MED ORDER — METOCLOPRAMIDE HCL 10 MG PO TABS
5.0000 mg | ORAL_TABLET | Freq: Three times a day (TID) | ORAL | Status: DC | PRN
  Administered 2014-01-31: 10 mg via ORAL
  Filled 2014-01-30: qty 1

## 2014-01-30 MED ORDER — CARVEDILOL 12.5 MG PO TABS
12.5000 mg | ORAL_TABLET | Freq: Two times a day (BID) | ORAL | Status: DC
  Administered 2014-01-31 – 2014-02-02 (×6): 12.5 mg via ORAL
  Filled 2014-01-30 (×7): qty 1

## 2014-01-30 MED ORDER — PANTOPRAZOLE SODIUM 40 MG PO TBEC
80.0000 mg | DELAYED_RELEASE_TABLET | Freq: Every day | ORAL | Status: DC
  Administered 2014-01-31 – 2014-02-02 (×3): 80 mg via ORAL
  Filled 2014-01-30 (×2): qty 2

## 2014-01-30 MED ORDER — METHOCARBAMOL 500 MG PO TABS
500.0000 mg | ORAL_TABLET | Freq: Four times a day (QID) | ORAL | Status: DC | PRN
Start: 2014-01-30 — End: 2014-02-02
  Administered 2014-01-30 – 2014-02-02 (×3): 500 mg via ORAL
  Filled 2014-01-30 (×3): qty 1

## 2014-01-30 MED ORDER — FENTANYL CITRATE 0.05 MG/ML IJ SOLN
25.0000 ug | INTRAMUSCULAR | Status: DC | PRN
  Administered 2014-01-30: 25 ug via INTRAVENOUS
  Administered 2014-01-30: 50 ug via INTRAVENOUS
  Administered 2014-01-30: 25 ug via INTRAVENOUS

## 2014-01-30 MED ORDER — FENTANYL CITRATE 0.05 MG/ML IJ SOLN
INTRAMUSCULAR | Status: DC | PRN
  Administered 2014-01-30 (×2): 25 ug via INTRAVENOUS
  Administered 2014-01-30 (×2): 50 ug via INTRAVENOUS

## 2014-01-30 MED ORDER — ADULT MULTIVITAMIN W/MINERALS CH
1.0000 | ORAL_TABLET | Freq: Every day | ORAL | Status: DC
  Administered 2014-01-30 – 2014-02-02 (×3): 1 via ORAL
  Filled 2014-01-30 (×4): qty 1

## 2014-01-30 MED ORDER — OXYCODONE HCL 5 MG PO TABS
5.0000 mg | ORAL_TABLET | ORAL | Status: DC | PRN
  Administered 2014-01-30: 10 mg via ORAL
  Administered 2014-01-30 – 2014-01-31 (×3): 15 mg via ORAL
  Administered 2014-02-01: 10 mg via ORAL
  Administered 2014-02-01 (×2): 15 mg via ORAL
  Administered 2014-02-02: 10 mg via ORAL
  Administered 2014-02-02: 15 mg via ORAL
  Filled 2014-01-30 (×2): qty 3
  Filled 2014-01-30: qty 2
  Filled 2014-01-30 (×4): qty 3
  Filled 2014-01-30: qty 2

## 2014-01-30 MED ORDER — 0.9 % SODIUM CHLORIDE (POUR BTL) OPTIME
TOPICAL | Status: DC | PRN
  Administered 2014-01-30: 1000 mL

## 2014-01-30 MED ORDER — ASPIRIN EC 81 MG PO TBEC: 81.0000 mg | DELAYED_RELEASE_TABLET | Freq: Every day | ORAL | Status: DC

## 2014-01-30 MED ORDER — SORBITOL 70 % SOLN: 30.0000 mL | Freq: Every day | Status: DC | PRN

## 2014-01-30 MED ORDER — METHOCARBAMOL 500 MG PO TABS
ORAL_TABLET | ORAL | Status: AC
  Administered 2014-01-30: 500 mg via ORAL
  Filled 2014-01-30: qty 1

## 2014-01-30 MED ORDER — ONDANSETRON HCL 4 MG/2ML IJ SOLN
INTRAMUSCULAR | Status: DC | PRN
  Administered 2014-01-30: 4 mg via INTRAVENOUS

## 2014-01-30 MED ORDER — METOCLOPRAMIDE HCL 5 MG/ML IJ SOLN: 10.0000 mg | Freq: Once | INTRAMUSCULAR | Status: DC | PRN

## 2014-01-30 MED ORDER — SENNOSIDES-DOCUSATE SODIUM 8.6-50 MG PO TABS
1.0000 | ORAL_TABLET | Freq: Two times a day (BID) | ORAL | Status: DC
  Administered 2014-01-31 – 2014-02-02 (×5): 1 via ORAL
  Filled 2014-01-30 (×5): qty 1

## 2014-01-30 MED ORDER — SODIUM CHLORIDE 0.9 % IV SOLN
INTRAVENOUS | Status: DC
  Administered 2014-01-30 – 2014-01-31 (×2): via INTRAVENOUS

## 2014-01-30 MED ORDER — FUROSEMIDE 40 MG PO TABS
40.0000 mg | ORAL_TABLET | Freq: Two times a day (BID) | ORAL | Status: DC
  Administered 2014-01-30 – 2014-02-02 (×7): 40 mg via ORAL
  Filled 2014-01-30 (×8): qty 1

## 2014-01-30 SURGICAL SUPPLY — 45 items
BANDAGE ESMARK 6X9 LF (GAUZE/BANDAGES/DRESSINGS) ×1 IMPLANT
BANDAGE GAUZE ELAST BULKY 4 IN (GAUZE/BANDAGES/DRESSINGS) ×12 IMPLANT
BLADE SAW RECIP 87.9 MT (BLADE) ×3 IMPLANT
BNDG CMPR 9X6 STRL LF SNTH (GAUZE/BANDAGES/DRESSINGS) ×1
BNDG COHESIVE 4X5 TAN STRL (GAUZE/BANDAGES/DRESSINGS) ×3 IMPLANT
BNDG COHESIVE 6X5 TAN STRL LF (GAUZE/BANDAGES/DRESSINGS) ×6 IMPLANT
BNDG ESMARK 6X9 LF (GAUZE/BANDAGES/DRESSINGS) ×3
COVER SURGICAL LIGHT HANDLE (MISCELLANEOUS) ×3 IMPLANT
CUFF TOURNIQUET SINGLE 34IN LL (TOURNIQUET CUFF) ×3 IMPLANT
DRAPE EXTREMITY T 121X128X90 (DRAPE) ×3 IMPLANT
DRAPE ORTHO SPLIT 77X108 STRL (DRAPES)
DRAPE PROXIMA HALF (DRAPES) ×3 IMPLANT
DRAPE SURG ORHT 6 SPLT 77X108 (DRAPES) IMPLANT
DRAPE U-SHAPE 47X51 STRL (DRAPES) ×3 IMPLANT
DRSG PAD ABDOMINAL 8X10 ST (GAUZE/BANDAGES/DRESSINGS) ×3 IMPLANT
ELECT CAUTERY BLADE 6.4 (BLADE) ×3 IMPLANT
ELECT REM PT RETURN 9FT ADLT (ELECTROSURGICAL) ×3
ELECTRODE REM PT RTRN 9FT ADLT (ELECTROSURGICAL) ×1 IMPLANT
EVACUATOR 1/8 PVC DRAIN (DRAIN) ×2 IMPLANT
FACESHIELD WRAPAROUND (MASK) IMPLANT
FACESHIELD WRAPAROUND OR TEAM (MASK) IMPLANT
GAUZE XEROFORM 5X9 LF (GAUZE/BANDAGES/DRESSINGS) ×3 IMPLANT
GLOVE BIOGEL PI IND STRL 7.0 (GLOVE) IMPLANT
GLOVE BIOGEL PI INDICATOR 7.0 (GLOVE) ×2
GLOVE SURG SS PI 7.0 STRL IVOR (GLOVE) ×2 IMPLANT
GLOVE SURG SS PI 7.5 STRL IVOR (GLOVE) ×6 IMPLANT
GOWN STRL REUS W/ TWL XL LVL3 (GOWN DISPOSABLE) ×1 IMPLANT
GOWN STRL REUS W/TWL XL LVL3 (GOWN DISPOSABLE) ×3
KIT BASIN OR (CUSTOM PROCEDURE TRAY) ×3 IMPLANT
NS IRRIG 1000ML POUR BTL (IV SOLUTION) ×3 IMPLANT
PACK GENERAL/GYN (CUSTOM PROCEDURE TRAY) ×3 IMPLANT
PAD ARMBOARD 7.5X6 YLW CONV (MISCELLANEOUS) ×3 IMPLANT
PAD CAST 4YDX4 CTTN HI CHSV (CAST SUPPLIES) ×4 IMPLANT
PADDING CAST COTTON 4X4 STRL (CAST SUPPLIES) ×12
SPONGE GAUZE 4X4 12PLY (GAUZE/BANDAGES/DRESSINGS) ×3 IMPLANT
SPONGE LAP 18X18 X RAY DECT (DISPOSABLE) IMPLANT
STAPLER VISISTAT 35W (STAPLE) IMPLANT
STOCKINETTE IMPERVIOUS LG (DRAPES) ×3 IMPLANT
SUT ETHILON 2 0 PSLX (SUTURE) ×6 IMPLANT
SUT PDS AB 0 CT 36 (SUTURE) ×3 IMPLANT
SUT SILK 0 TIES 10X30 (SUTURE) ×3 IMPLANT
SUT SILK 2 0 SH CR/8 (SUTURE) IMPLANT
SUT VIC AB 2-0 CT1 27 (SUTURE) ×6
SUT VIC AB 2-0 CT1 TAPERPNT 27 (SUTURE) ×2 IMPLANT
TOWEL OR 17X26 10 PK STRL BLUE (TOWEL DISPOSABLE) ×9 IMPLANT

## 2014-01-30 NOTE — Progress Notes (Signed)
Orthopedic Tech Progress Note Patient Details:  [name] [name] 09-15-1945 [id number]  Ortho Devices Ortho Device/Splint Location: has overhead frame on bed Ortho Device/Splint Interventions: Ordered;Application   [name] 01/30/2014, 8:05 PM

## 2014-01-30 NOTE — H&P (Signed)
PREOPERATIVE H&P  Chief Complaint: RIght non-healing foot ulcers  HPI: [name] [name] is a 68 y.o. male who presents for surgical treatment of RIght non-healing ischemic foot ulcers.  He denies any changes in medical history.  Past Medical History  Diagnosis Date  . GERD (gastroesophageal reflux disease)   . Obesity   . Cardiomyopathy, nonischemic     a. Cath 2003: mild nonobstructive CAD, EF 25% at that time.  . Hypertension   . Chronic systolic CHF (congestive heart failure)     a. NICM EF 25% dating back to at least 2003.  [name] PAF (paroxysmal atrial fibrillation)     a. Noted on ICD interrogation 2012;  b. coumadin d/c'd 01/2013.  [name] NSVT (nonsustained ventricular tachycardia)     a. Noted on ICD interrogation in 2011.  [name] LBBB (left bundle branch block)   . Critical lower limb ischemia   . High cholesterol   . PAD (peripheral artery disease)     a. 08/2013 Periph Angio/PTA: Abd Ao nl, RLE- 3v runoff, PT diff dzs, AT 90p, LLE 2v runoff, PT 100, AT 65m (diamondback ORA/chocolate balloon PTA).  . Type II diabetes mellitus   . Uncontrolled pain, Lt toe 09/21/2013  . Gangrenous toe, Lt toe 09/21/2013  . CAD (coronary artery disease)     a. Nonobstructive by cath 09/2001.  [name] Automatic implantable cardioverter-defibrillator in situ     a. 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  . Dementia   . Arthritis   . History of blood transfusion   . Complication of anesthesia   . PONV (postoperative nausea and vomiting)    Past Surgical History  Procedure Laterality Date  . Lithotripsy  2001  . Cervical spine surgery  1994  . Cardiac defibrillator placement  06/2009    WITH GENERATOR REPLACED; BiV ICD  . US echocardiography  03/21/2008    EF 30-35%  . Cardiovascular stress test  03/20/2009    EF 33%  . Transluminal atherectomy tibial artery Left 09/12/2013  . Cardiac catheterization  10/01/2001    THERE WAS GLOBAL HYPOKINESIS AND EF 25%. THERE APPEARED TO BE  GLOBAL DECREASE IN WALL MOTION  . Toe amputation  10/04/2013    LEFT GREAT TOE AND 4TH TOE   /   DR Erlinda Hong  . Amputation Left 10/04/2013    Procedure: LEFT GREAT TOE AND SECOND TOE AMPUTATION;  Surgeon: Marianna Payment, MD;  Location: [place];  Service: Orthopedics;  Laterality: Left;  . Colonoscopy    . Leg amputation below knee Left 11/09/2013    DR Erlinda Hong  . Amputation Left 11/09/2013    Procedure: LEFT AMPUTATION BELOW KNEE;  Surgeon: Marianna Payment, MD;  Location: [place];  Service: Orthopedics;  Laterality: Left;  . Coronary angioplasty    . Amputation Right 01/04/2014    Procedure: Doristine Devoid second and third toe amputation;  Surgeon: Marianna Payment, MD;  Location: [place];  Service: Orthopedics;  Laterality: Right;   History   Social History  . Marital Status: Married    Spouse Name: N/A    Number of Children: N/A  . Years of Education: N/A   Social History Main Topics  . Smoking status: Never Smoker   . Smokeless tobacco: Never Used  . Alcohol Use: No  . Drug Use: No  . Sexual Activity: Yes   Other Topics Concern  . None   Social History Narrative  . None   Family History  Problem Relation  Age of Onset  . Heart disease Mother   . Hypertension Mother   . Diabetes Mother   . Diabetes Father    No Known Allergies Prior to Admission medications   Medication Sig Start Date End Date Taking? Authorizing Provider  aspirin EC 81 MG tablet Take 1 tablet (81 mg total) by mouth daily. 12/30/13  Yes Luke K Kilroy, PA-C  atorvastatin (LIPITOR) 10 MG tablet Take 10 mg by mouth daily.     Yes Historical Provider, MD  carvedilol (COREG) 12.5 MG tablet Take 12.5 mg by mouth 2 (two) times daily with a meal.   Yes Historical Provider, MD  clopidogrel (PLAVIX) 75 MG tablet Take 75 mg by mouth daily with breakfast.  11/29/13  Yes Historical Provider, MD  esomeprazole (NEXIUM) 40 MG capsule Take 40 mg by mouth daily as needed (acid reflux).    Yes Historical Provider, MD  furosemide (LASIX) 40 MG  tablet Take 1 tablet (40 mg total) by mouth 2 (two) times daily. 06/28/13  Yes Deboraha Sprang, MD  HYDROcodone-acetaminophen (NORCO/VICODIN) 5-325 MG per tablet Take 1-2 tablets by mouth every 4 (four) hours as needed for moderate pain. 01/06/14  Yes Naiping Eduard Roux, MD  insulin aspart (NOVOLOG FLEXPEN) 100 UNIT/ML FlexPen Inject 4 Units into the skin 2 (two) times daily as needed for high blood sugar (cbg over 140).   Yes Historical Provider, MD  Insulin Detemir (LEVEMIR FLEXPEN) 100 UNIT/ML Pen Inject 35 Units into the skin at bedtime.   Yes Historical Provider, MD  iron polysaccharides (NIFEREX) 150 MG capsule Take 1 capsule (150 mg total) by mouth daily. 11/28/13  Yes Ivan Anchors Love, PA-C  metFORMIN (GLUCOPHAGE) 500 MG tablet Take 1 tablet (500 mg total) by mouth 2 (two) times daily. 01/01/14  Yes Erlene Quan, PA-C  Multiple Vitamin (MULTIVITAMIN WITH MINERALS) TABS tablet Take 1 tablet by mouth daily.   Yes Historical Provider, MD  nitroGLYCERIN (NITRODUR - DOSED IN MG/24 HR) 0.2 mg/hr patch Place 0.4 mg onto the skin daily. Apply to upper part of foot   Yes Historical Provider, MD  potassium chloride SA (K-DUR,KLOR-CON) 20 MEQ tablet Take 1 tablet (20 mEq total) by mouth 2 (two) times daily. 11/28/13  Yes Ivan Anchors Love, PA-C  pregabalin (LYRICA) 75 MG capsule Take 1 capsule (75 mg total) by mouth 2 (two) times daily. 11/28/13  Yes Ivan Anchors Love, PA-C  senna-docusate (SENOKOT-S) 8.6-50 MG per tablet Take 1 tablet by mouth 2 (two) times daily. 11/28/13  Yes Ivan Anchors Love, PA-C  spironolactone (ALDACTONE) 25 MG tablet Take 12.5 mg by mouth at bedtime.  11/28/13  Yes Historical Provider, MD  vitamin E (VITAMIN E) 1000 UNIT capsule Take 1,000 Units by mouth daily.   Yes Historical Provider, MD     Positive ROS: All other systems have been reviewed and were otherwise negative with the exception of those mentioned in the HPI and as above.  Physical Exam: General: Alert, no acute distress Cardiovascular: No  pedal edema Respiratory: No cyanosis, no use of accessory musculature GI: No organomegaly, abdomen is soft and non-tender Skin: No lesions in the area of chief complaint Neurologic: Sensation intact distally Psychiatric: Patient is competent for consent with normal mood and affect Lymphatic: No axillary or cervical lymphadenopathy  MUSCULOSKELETAL:  - exam stable and unchanged  Assessment: RIght non-healing foot ulcers  Plan: Plan for Procedure(s): RIGHT BELOW KNEE AMPUTATION  The risks benefits and alternatives were discussed with the patient including but not  limited to the risks of nonoperative treatment, versus surgical intervention including infection, bleeding, nerve injury,  blood clots, cardiopulmonary complications, morbidity, mortality, among others, and they were willing to proceed.   Marianna Payment, MD   01/30/2014 6:54 AM

## 2014-01-30 NOTE — Op Note (Signed)
   Date of Surgery: 01/30/2014  INDICATIONS: [name] [name] is a 68 y.o.-year-old male who presents for surgical treatment of nonhealing ischemic ulcers of the right foot. ;  The patient and family did consent to the procedure after discussion of the risks and benefits.  PREOPERATIVE DIAGNOSIS: Right foot ischemic nonhealing ulcers and osteomyelitis  POSTOPERATIVE DIAGNOSIS: Same.  PROCEDURE: Right transtibial amputation  SURGEON: N. Eduard Roux, M.D.  ASSIST: None.  ANESTHESIA:  general  IV FLUIDS AND URINE: See anesthesia.  ESTIMATED BLOOD LOSS: 100 mL.  IMPLANTS: None  DRAINS: Medium Hemovac  COMPLICATIONS: None.  DESCRIPTION OF PROCEDURE: The patient was brought to the operating room and placed supine on the operating table.  The patient had been signed prior to the procedure and this was documented. The patient had the anesthesia placed by the anesthesiologist.  A time-out was performed to confirm that this was the correct patient, site, side and location. The patient had an SCD on the opposite lower extremity. The patient did receive antibiotics prior to the incision and was re-dosed during the procedure as needed at indicated intervals.  A nonsterile tourniquet was placed on the upper right thigh. The patient had the operative extremity prepped and draped in the standard surgical fashion.   The extremity was exsanguinated using Esmarch bandage. The tourniquet was inflated to 300 mm mercury. A fishmouth incision with a large posterior flap was used. Sharp dissection was taken down through the muscle bellies. The neurovascular bundles were identified and ligated. The nerves were dissected and sharply incised and allowed to retract up the leg. The tibia was osteotomized approximately 15 cm distal to the joint line. The fibula was also osteotomized approximately 1 cm proximal to the level of the tibial osteotomy. The tibial osteotomy was smoothed off to eliminate any sharp edges. The  tourniquet was deflated. Hemostasis was obtained. The wound was thoroughly irrigated with 3 L of normal saline. A medium Hemovac was placed in the wound. The wound was then closed in a layered fashion using 0 PDS for the fascia. 2-0 Monocryl for the deep skin layer and 2-0 nylon for the skin. A sterile dressing was applied. The patient tolerated the procedure well was extubated and transferred to the PACU in stable condition.  POSTOPERATIVE PLAN: The patient will be nonweightbearing to the right lower extremity. He will resume his aspirin and Plavix. He will need to be placed in the CIR at [facility].  Azucena Cecil, MD [place] 5:08 PM

## 2014-01-30 NOTE — Anesthesia Procedure Notes (Signed)
Procedure Name: LMA Insertion Date/Time: 01/30/2014 3:24 PM Performed by: Sampson Si E Pre-anesthesia Checklist: Patient identified, Emergency Drugs available, Suction available, Patient being monitored and Timeout performed Patient Re-evaluated:Patient Re-evaluated prior to inductionOxygen Delivery Method: Circle system utilized Preoxygenation: Pre-oxygenation with 100% oxygen Intubation Type: IV induction Ventilation: Mask ventilation without difficulty LMA: LMA inserted LMA Size: 5.0 Number of attempts: 1 Placement Confirmation: positive ETCO2 and breath sounds checked- equal and bilateral Tube secured with: Tape Dental Injury: Teeth and Oropharynx as per pre-operative assessment

## 2014-01-30 NOTE — Transfer of Care (Signed)
Immediate Anesthesia Transfer of Care Note  [name]  Procedure(s) Performed: Procedure(s): RIGHT BELOW KNEE AMPUTATION (Right)  Patient Location: PACU  Anesthesia Type:General  Level of Consciousness: lethargic and responds to stimulation  Airway & Oxygen Therapy: Patient Spontanous Breathing and Patient connected to nasal cannula oxygen  Post-op Assessment: Report given to PACU RN  Post vital signs: Reviewed and stable  Complications: No apparent anesthesia complications

## 2014-01-30 NOTE — Anesthesia Preprocedure Evaluation (Addendum)
Anesthesia Evaluation  Patient identified by MRN, date of birth, ID band Patient awake    Reviewed: Allergy & Precautions, H&P , NPO status , Patient's Chart, lab work & pertinent test results, reviewed documented beta blocker date and time   History of Anesthesia Complications (+) PONV and history of anesthetic complications  Airway Mallampati: II TM Distance: >3 FB Neck ROM: full    Dental  (+) Partial Upper, Partial Lower, Missing, Loose,    Pulmonary neg pulmonary ROS,  breath sounds clear to auscultation        Cardiovascular hypertension, On Medications and On Home Beta Blockers + CAD, + Peripheral Vascular Disease and +CHF + dysrhythmias + Cardiac Defibrillator Rhythm:regular     Neuro/Psych PSYCHIATRIC DISORDERS negative neurological ROS     GI/Hepatic negative GI ROS, Neg liver ROS,   Endo/Other  diabetes, Insulin Dependent  Renal/GU negative Renal ROS  negative genitourinary   Musculoskeletal   Abdominal   Peds  Hematology negative hematology ROS (+) Blood dyscrasia, anemia ,   Anesthesia Other Findings See surgeon's H&P   Reproductive/Obstetrics negative OB ROS                          Anesthesia Physical Anesthesia Plan  ASA: III  Anesthesia Plan: General   Post-op Pain Management:    Induction: Intravenous  Airway Management Planned: LMA  Additional Equipment:   Intra-op Plan:   Post-operative Plan: Extubation in OR  Informed Consent: I have reviewed the patients History and Physical, chart, labs and discussed the procedure including the risks, benefits and alternatives for the proposed anesthesia with the patient or authorized representative who has indicated his/her understanding and acceptance.   Dental Advisory Given  Plan Discussed with: CRNA and Surgeon  Anesthesia Plan Comments:         Anesthesia Quick Evaluation

## 2014-01-30 NOTE — Anesthesia Postprocedure Evaluation (Signed)
  Anesthesia Post-op Note  [name]  Procedure(s) Performed: Procedure(s): RIGHT BELOW KNEE AMPUTATION (Right)  Patient Location: PACU  Anesthesia Type:General  Level of Consciousness: awake, alert  and oriented  Airway and Oxygen Therapy: Patient Spontanous Breathing and Patient connected to nasal cannula oxygen  Post-op Pain: mild  Post-op Assessment: Post-op Vital signs reviewed, Patient's Cardiovascular Status Stable, Respiratory Function Stable, Patent Airway and Pain level controlled  Post-op Vital Signs: stable  Last Vitals:  Filed Vitals:   01/30/14 1800  BP: 124/72  Pulse: 93  Temp:   Resp: 16    Complications: No apparent anesthesia complications

## 2014-01-31 ENCOUNTER — Encounter (HOSPITAL_COMMUNITY): Payer: Self-pay | Admitting: Orthopaedic Surgery

## 2014-01-31 LAB — BASIC METABOLIC PANEL
BUN: 11 mg/dL (ref 6–23)
CO2: 25 mEq/L (ref 19–32)
Calcium: 8.6 mg/dL (ref 8.4–10.5)
Chloride: 100 mEq/L (ref 96–112)
Creatinine, Ser: 0.85 mg/dL (ref 0.50–1.35)
GFR calc non Af Amer: 86 mL/min — ABNORMAL LOW (ref >90)
GLUCOSE: 233 mg/dL — AB (ref 70–99)
Potassium: 4.2 mEq/L (ref 3.7–5.3)
SODIUM: 139 meq/L (ref 137–147)

## 2014-01-31 LAB — GLUCOSE, CAPILLARY
GLUCOSE-CAPILLARY: 284 mg/dL — AB (ref 70–99)
Glucose-Capillary: 172 mg/dL — ABNORMAL HIGH (ref 70–99)
Glucose-Capillary: 124 mg/dL — ABNORMAL HIGH (ref 70–99)
Glucose-Capillary: 143 mg/dL — ABNORMAL HIGH (ref 70–99)

## 2014-01-31 LAB — CBC
HCT: 26.8 % — ABNORMAL LOW (ref 39.0–52.0)
HEMOGLOBIN: 8.6 g/dL — AB (ref 13.0–17.0)
MCH: 23.1 pg — ABNORMAL LOW (ref 26.0–34.0)
MCHC: 32.1 g/dL (ref 30.0–36.0)
MCV: 72 fL — ABNORMAL LOW (ref 78.0–100.0)
Platelets: 316 10*3/uL (ref 150–400)
RBC: 3.72 MIL/uL — AB (ref 4.22–5.81)
RDW: 17.8 % — ABNORMAL HIGH (ref 11.5–15.5)
WBC: 9 10*3/uL (ref 4.0–10.5)

## 2014-01-31 MED ORDER — HYDROCODONE-ACETAMINOPHEN 5-325 MG PO TABS: 1.0000 | ORAL_TABLET | Freq: Four times a day (QID) | ORAL | Status: DC | PRN

## 2014-01-31 NOTE — Evaluation (Signed)
Agree with SPT.    Cathern Tahir, PT [phone]  

## 2014-01-31 NOTE — Progress Notes (Addendum)
   Subjective:  Patient reports pain as moderate.  No events  Objective:   VITALS:   Filed Vitals:   01/30/14 1839 01/30/14 1956 01/30/14 2343 01/31/14 0340  BP: 126/75 131/67 131/73 121/69  Pulse: 95 90 92 89  Temp: 98 F (36.7 C) 98.4 F (36.9 C) 98.2 F (36.8 C) 98.1 F (36.7 C)  TempSrc:  Oral Oral Oral  Resp: 16 18 18 18   Height:      Weight:      SpO2: 100% 100% 100% 100%    Neurologically intact Incision: dressing C/D/I and no drainage No cellulitis present Compartment soft   Lab Results  Component Value Date   WBC 9.0 01/31/2014   HGB 8.6* 01/31/2014   HCT 26.8* 01/31/2014   MCV 72.0* 01/31/2014   PLT 316 01/31/2014     Assessment/Plan:  1 Day Post-Op   - Expected postop acute blood loss anemia - will monitor for symptoms - Up with PT/OT - DVT ppx - SCDs, ambulation, asa, plavix - NWB right lower extremity - patient needs CIR - HVAC pulled  Problem List Items Addressed This Visit     Musculoskeletal and Integument   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

## 2014-01-31 NOTE — Progress Notes (Signed)
PT Cancellation Note  Patient Details Name: [name] [name] MRN: [id number] DOB: 1945-09-20   Cancelled Treatment:    Reason Eval/Treat Not Completed: Medical issues which prohibited therapy.  Pt nauseated at this time.  Will try back another time.     Thornton Papas Jordan Pardini 01/31/2014, 10:38 AM

## 2014-01-31 NOTE — Evaluation (Signed)
Physical Therapy Evaluation Patient Details Name: [name] [name] MRN: [id number] DOB: 1946/04/17 Today's Date: 01/31/2014   History of Present Illness  68 y.o. Male s/p R BKA, hx of L BKA, CHF and dementia  Clinical Impression  Pt was drowsy/lethargic throughout PT today and complained of nausea, about which his nurse was already notified.  Pt required mod-A to sit up from supine and max-A +2 to help him slide hips over bed for anterior-posterior transfer to chair.  Pt was unable to perform chair press-ups at this time, but did attempt knee extension and hip flexion exercises through limited ROM.  Pt participation in PT limited by drowsiness and nausea.  Recommend CIR placement for pt upon d/c to improve mobility, strength, and safety before returning to home.  If pt d/c to home, he will need a wheelchair with cushion and a 3-in-1, however, pt is currently unclear about whether he already has this equipment at home.  Will continue to follow.    Follow Up Recommendations CIR    Equipment Recommendations  3in1 (PT);Wheelchair (measurements PT);Wheelchair cushion (measurements PT) (Pt unsure if he has equipment at home.)    Recommendations for Other Services Rehab consult;OT consult     Precautions / Restrictions Precautions Precautions: Fall Precaution Comments: Discussed precautions with pt. Restrictions Weight Bearing Restrictions: Yes RLE Weight Bearing: Non weight bearing      Mobility  Bed Mobility Overal bed mobility: Needs Assistance;+2 for physical assistance Bed Mobility: Supine to Sit     Supine to sit: Mod assist     General bed mobility comments: Mod-A to power up to sitting from supine.  Transfers Overall transfer level: Needs assistance Equipment used: None Transfers: Comptroller transfers: Max assist;+2 physical assistance   General transfer comment: Max-A +2 to slide pt over bed with bed pad.  Pt assists  slightly with bil. UE to push on bed.  Max VC required to encourage pt to push through UEs.  Ambulation/Gait                Stairs            Wheelchair Mobility    Modified Rankin (Stroke Patients Only)       Balance Overall balance assessment: Needs assistance Sitting-balance support: Bilateral upper extremity supported (Legs supported on bed) Sitting balance-Leahy Scale: Poor Sitting balance - Comments: Pt required bil. UE and mod-A to maintain sitting balance longer than a few seconds.                                     Pertinent Vitals/Pain Pt reports nausea.  Nurse notified prior to PT session.    Home Living Family/patient expects to be discharged to:: Inpatient rehab Living Arrangements: Spouse/significant other Available Help at Discharge: Family;Available 24 hours/day Type of Home: House Home Access: Stairs to enter Entrance Stairs-Rails:  (Pt does not remember) Entrance Stairs-Number of Steps: 2 Home Layout: One level Home Equipment: None Additional Comments: Pt report of home living information is questionable, does not match previous reports from prior hospital admission in May.  Pt drowsy and limited in participation today.    Prior Function           Comments: Pt unable to report on prior function due to drowsiness/nausea during therapy today.     Hand Dominance        Extremity/Trunk Assessment  Upper Extremity Assessment: Overall WFL for tasks assessed           Lower Extremity Assessment: RLE deficits/detail;LLE deficits/detail RLE Deficits / Details: Pt s/p R BKA.  Increased pain in R LE. LLE Deficits / Details: Pt has hx of transtibial amputation.     Communication   Communication: No difficulties  Cognition Arousal/Alertness: Lethargic (Question nausea meds) Behavior During Therapy: Flat affect Overall Cognitive Status: Difficult to assess                      General Comments General  comments (skin integrity, edema, etc.): Pt had difficulty staying awake throughout PT session and required repeated VC to elicit a response.  Pt reports that he feels nauseous and tired.    Exercises Amputee Exercises Hip Flexion/Marching: AROM;Right;5 reps;Seated Knee Extension: AROM;10 reps;Seated;Right Chair Push Up: AROM;Both;Strengthening;Other reps (comment);Seated (Attempted 1 rep, unable to perform at this time)      Assessment/Plan    PT Assessment Patient needs continued PT services  PT Diagnosis Difficulty walking;Acute pain   PT Problem List Decreased strength;Decreased activity tolerance;Decreased range of motion;Decreased balance;Decreased mobility;Decreased knowledge of use of DME;Decreased safety awareness;Decreased knowledge of precautions;Pain  PT Treatment Interventions DME instruction;Gait training;Functional mobility training;Stair training;Therapeutic activities;Therapeutic exercise;Balance training;Patient/family education;Wheelchair mobility training   PT Goals (Current goals can be found in the Care Plan section) Acute Rehab PT Goals Patient Stated Goal: None stated PT Goal Formulation: With patient Time For Goal Achievement: 02/14/14 Potential to Achieve Goals: Good    Frequency Min 3X/week   Barriers to discharge        Co-evaluation               End of Session   Activity Tolerance: Patient limited by lethargy;Treatment limited secondary to medical complications (Comment) (nausea) Patient left: in chair;with call bell/phone within reach Nurse Communication: Mobility status         Time: 1345-1417 PT Time Calculation (min): 32 min   Charges:   PT Evaluation $Initial PT Evaluation Tier I: 1 Procedure PT Treatments $Therapeutic Exercise: 8-22 mins $Therapeutic Activity: 8-22 mins   PT G Codes:          Angeleen Horney, SPT 01/31/2014, 2:58 PM

## 2014-01-31 NOTE — Progress Notes (Signed)
Utilization review completed.  

## 2014-01-31 NOTE — Progress Notes (Signed)
Inpatient Diabetes Program Recommendations  AACE/ADA: New Consensus Statement on Inpatient Glycemic Control (2013)  Target Ranges:  Prepandial:   less than 140 mg/dL      Peak postprandial:   less than 180 mg/dL (1-2 hours)      Critically ill patients:  140 - 180 mg/dL   Results for EIVAN, GALLINA (MRN [id number]) as of 01/31/2014 12:02  Ref. Range 01/30/2014 14:31 01/30/2014 17:12 01/30/2014 21:46 01/31/2014 06:53 01/31/2014 11:08  Glucose-Capillary Latest Range: 70-99 mg/dL 116 (H) 112 (H) 169 (H) 172 (H) 124 (H)   Diabetes history: DM2 Outpatient Diabetes medications: Levemir 35 untis QHS, Novolog 4 units BID (PRN if CBG greater than 140 mg/dl), Metformin 500 mg BID Current orders for Inpatient glycemic control: Levemir 35 units QHS, Novolog 0-15 units AC, Novolog 0-5 units HS, Novolog 4 units BID PRN (if CBG greater than 140 mg/dl)  Inpatient Diabetes Program Recommendations Correction (SSI): Noted patient's home Novolog 4 units BID (if CBG greater than 140 mg/dl) was ordered. However, patient is on Novolog moderate correction scale ACHS and does not need the PRN Novolog 4 units BID correction ordered. Please discontiue order for Novolog 4 units BID PRN.  Thanks, Barnie Alderman, RN, MSN, CCRN Diabetes Coordinator Inpatient Diabetes Program [phone] (Team Pager) [phone] ([facility] office) [phone] [facility] office)

## 2014-01-31 NOTE — Progress Notes (Signed)
Physical medicine rehabilitation consult requested. Patient status post right transtibial amputation 01/30/2014. Await physical occupational therapy evaluation and recommendations and followup at that time with formal rehabilitation consult.

## 2014-02-01 DIAGNOSIS — L98499 Non-pressure chronic ulcer of skin of other sites with unspecified severity: Secondary | ICD-10-CM

## 2014-02-01 DIAGNOSIS — I739 Peripheral vascular disease, unspecified: Secondary | ICD-10-CM

## 2014-02-01 DIAGNOSIS — S88119A Complete traumatic amputation at level between knee and ankle, unspecified lower leg, initial encounter: Secondary | ICD-10-CM

## 2014-02-01 LAB — GLUCOSE, CAPILLARY
GLUCOSE-CAPILLARY: 222 mg/dL — AB (ref 70–99)
Glucose-Capillary: 217 mg/dL — ABNORMAL HIGH (ref 70–99)
Glucose-Capillary: 132 mg/dL — ABNORMAL HIGH (ref 70–99)
Glucose-Capillary: 147 mg/dL — ABNORMAL HIGH (ref 70–99)

## 2014-02-01 LAB — BASIC METABOLIC PANEL
BUN: 10 mg/dL (ref 6–23)
CALCIUM: 8.1 mg/dL — AB (ref 8.4–10.5)
CO2: 26 mEq/L (ref 19–32)
CREATININE: 0.85 mg/dL (ref 0.50–1.35)
Chloride: 104 mEq/L (ref 96–112)
GFR calc Af Amer: >90 mL/min (ref >90)
GFR, EST NON AFRICAN AMERICAN: 86 mL/min — AB (ref >90)
Glucose, Bld: 224 mg/dL — ABNORMAL HIGH (ref 70–99)
Potassium: 4.2 mEq/L (ref 3.7–5.3)
SODIUM: 141 meq/L (ref 137–147)

## 2014-02-01 NOTE — Care Management Note (Signed)
CARE MANAGEMENT NOTE 02/01/2014  Patient:  [name] [name], [name] [name]   Account Number:  [id number]  Date Initiated:  02/01/2014  Documentation initiated by:  Ricki Miller  Subjective/Objective Assessment:   68 yr old male s/p right transtibial ampuatation.     Action/Plan:   Patient being evaluated by CIR, Case manager to follow.   Anticipated DC Date:     Anticipated DC Plan:           Choice offered to / List presented to:             Status of service:  In process, will continue to follow Medicare Important Message given?  YES (If response is "NO", the following Medicare IM given date fields will be blank) Date Medicare IM given:  01/30/2014

## 2014-02-01 NOTE — Progress Notes (Signed)
Agree with SPT.    Dagoberto Nealy, PT [phone]  

## 2014-02-01 NOTE — Progress Notes (Signed)
Physical Therapy Treatment Patient Details Name: [name] [name] MRN: [id number] DOB: Oct 05, 1945 Today's Date: 02/01/2014    History of Present Illness 68 y.o. Male s/p R BKA, hx of L BKA, CHF and dementia    PT Comments    Pt less groggy and able to participate more fully in PT today than yesterday.  Pt continues to require mod-A to power up to sitting from supine and to maintain sitting balance without posterior lean.  Pt was able to assist with lateral slide transfer using bil. UE, but still required max-A with bed pad to slide over bed surface and maintain upright sitting posture.  Pt tolerated seated exercises well today.  Will continue to follow.   Follow Up Recommendations  CIR     Equipment Recommendations  3in1 (PT);Wheelchair (measurements PT);Wheelchair cushion (measurements PT)    Recommendations for Other Services OT consult     Precautions / Restrictions Precautions Precautions: Fall Precaution Comments: Discussed precautions with pt. Restrictions Weight Bearing Restrictions: Yes RLE Weight Bearing: Non weight bearing    Mobility  Bed Mobility Overal bed mobility: Needs Assistance Bed Mobility: Supine to Sit     Supine to sit: Mod assist;HOB elevated     General bed mobility comments: Pt needed mod-A to finish powering up to sitting and requires sustained mod-A support to prevent fall backward.  Transfers Overall transfer level: Needs assistance Equipment used: None Transfers: Lateral/Scoot Transfers          Lateral/Scoot Transfers: Max assist General transfer comment: Pt was able to help slide laterally with bil. UEs, but needed max-A to slide him laterally over EOB to chair using bed pad.  Ambulation/Gait                 Stairs            Wheelchair Mobility    Modified Rankin (Stroke Patients Only)       Balance Overall balance assessment: Needs assistance Sitting-balance support: Bilateral upper extremity supported  (Legs supported outstretched on bed) Sitting balance-Leahy Scale: Poor Sitting balance - Comments: Pt continues to require bil. UE and mod-A to maintain sitting balance longer than a few seconds.                            Cognition Arousal/Alertness: Awake/alert Behavior During Therapy: WFL for tasks assessed/performed Overall Cognitive Status: Within Functional Limits for tasks assessed                      Exercises Amputee Exercises Hip ABduction/ADduction: AROM;Right;10 reps;Seated Knee Extension: AROM;10 reps;Seated;Right Straight Leg Raises: AROM;Left;10 reps;Seated Chair Push Up: AROM;Strengthening;Both;5 reps;Seated    General Comments        Pertinent Vitals/Pain Pt reports that his R LE is having some pain, but he does not request pain meds at this time.    Home Living                      Prior Function            PT Goals (current goals can now be found in the care plan section) Acute Rehab PT Goals PT Goal Formulation: With patient Time For Goal Achievement: 02/14/14 Potential to Achieve Goals: Good Progress towards PT goals: Progressing toward goals    Frequency  Min 3X/week    PT Plan Current plan remains appropriate    Co-evaluation  End of Session   Activity Tolerance: Patient tolerated treatment well Patient left: in chair;with call bell/phone within reach     Time: 1008-1020 PT Time Calculation (min): 12 min  Charges:  $Therapeutic Activity: 8-22 mins                    G Codes:      Arjan Strohm, SPT 02/01/2014, 11:03 AM

## 2014-02-01 NOTE — Consult Note (Signed)
Physical Medicine and Rehabilitation Consult Reason for Consult: Right transtibial amputation Referring Physician: Dr.Xu   HPI: [name] [name] is a 68 y.o. right-handed male with history of nonischemic cardiomyopathy with ICD, hypertension, chronic systolic congestive heart failure, PAF, diabetes mellitus with peripheral neuropathy and left BKA secondary to osteomyelitis received inpatient rehabilitation services in March of 2015 and currently awaiting prosthesis. Patient lives with his wife primarily using a wheelchair prior to admission and a walker for stand pivot transfers. Admitted 01/30/2014 with nonhealing right foot ulcers and findings of osteomyelitis. No relief with conservative care. Underwent right transtibial amputation 01/30/2014 per Dr.Xu. Postoperative pain management. Acute blood loss anemia 8.6 and monitored. Aspirin and Plavix resumed postoperatively for history of CAD. Physical therapy evaluation completed 01/31/2014 with recommendations of physical medicine rehabilitation consult.  Pt remembers me from prior rehab stay.  States he was measured for L BK prosthesis , not sure whether it is finished Review of Systems  Cardiovascular: Positive for leg swelling.  Gastrointestinal: Positive for constipation.       GERD  Neurological: Positive for weakness.  Psychiatric/Behavioral: Positive for memory loss.  All other systems reviewed and are negative.  Past Medical History  Diagnosis Date  . GERD (gastroesophageal reflux disease)   . Obesity   . Cardiomyopathy, nonischemic     a. Cath 2003: mild nonobstructive CAD, EF 25% at that time.  . Hypertension   . Chronic systolic CHF (congestive heart failure)     a. NICM EF 25% dating back to at least 2003.  [name] PAF (paroxysmal atrial fibrillation)     a. Noted on ICD interrogation 2012;  b. coumadin d/c'd 01/2013.  [name] NSVT (nonsustained ventricular tachycardia)     a. Noted on ICD interrogation in 2011.  [name] LBBB (left  bundle branch block)   . Critical lower limb ischemia   . High cholesterol   . PAD (peripheral artery disease)     a. 08/2013 Periph Angio/PTA: Abd Ao nl, RLE- 3v runoff, PT diff dzs, AT 90p, LLE 2v runoff, PT 100, AT 74m (diamondback ORA/chocolate balloon PTA).  . Type II diabetes mellitus   . Uncontrolled pain, Lt toe 09/21/2013  . Gangrenous toe, Lt toe 09/21/2013  . CAD (coronary artery disease)     a. Nonobstructive by cath 09/2001.  [name] Automatic implantable cardioverter-defibrillator in situ     a. 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  . Dementia   . Arthritis   . History of blood transfusion   . Complication of anesthesia   . PONV (postoperative nausea and vomiting)    Past Surgical History  Procedure Laterality Date  . Lithotripsy  2001  . Cervical spine surgery  1994  . Cardiac defibrillator placement  06/2009    WITH GENERATOR REPLACED; BiV ICD  . US echocardiography  03/21/2008    EF 30-35%  . Cardiovascular stress test  03/20/2009    EF 33%  . Transluminal atherectomy tibial artery Left 09/12/2013  . Cardiac catheterization  10/01/2001    THERE WAS GLOBAL HYPOKINESIS AND EF 25%. THERE APPEARED TO BE GLOBAL DECREASE IN WALL MOTION  . Toe amputation  10/04/2013    LEFT GREAT TOE AND 4TH TOE   /   DR Erlinda Hong  . Amputation Left 10/04/2013    Procedure: LEFT GREAT TOE AND SECOND TOE AMPUTATION;  Surgeon: Marianna Payment, MD;  Location: [facility];  Service: Orthopedics;  Laterality: Left;  . Colonoscopy    .  Leg amputation below knee Left 11/09/2013    DR Erlinda Hong  . Amputation Left 11/09/2013    Procedure: LEFT AMPUTATION BELOW KNEE;  Surgeon: Marianna Payment, MD;  Location: [facility] [facility];  Service: Orthopedics;  Laterality: Left;  . Coronary angioplasty    . Amputation Right 01/04/2014    Procedure: Doristine Devoid second and third toe amputation;  Surgeon: Marianna Payment, MD;  Location: [place];  Service: Orthopedics;  Laterality: Right;  . Amputation Right 01/30/2014     Procedure: RIGHT BELOW KNEE AMPUTATION;  Surgeon: Marianna Payment, MD;  Location: [facility];  Service: Orthopedics;  Laterality: Right;   Family History  Problem Relation Age of Onset  . Heart disease Mother   . Hypertension Mother   . Diabetes Mother   . Diabetes Father    Social History:  reports that he has never smoked. He has never used smokeless tobacco. He reports that he does not drink alcohol or use illicit drugs. Allergies: No Known Allergies Medications Prior to Admission  Medication Sig Dispense Refill  . aspirin EC 81 MG tablet Take 1 tablet (81 mg total) by mouth daily.      [name] atorvastatin (LIPITOR) 10 MG tablet Take 10 mg by mouth daily.        . carvedilol (COREG) 12.5 MG tablet Take 12.5 mg by mouth 2 (two) times daily with a meal.      . clopidogrel (PLAVIX) 75 MG tablet Take 75 mg by mouth daily with breakfast.       . esomeprazole (NEXIUM) 40 MG capsule Take 40 mg by mouth daily as needed (acid reflux).       . furosemide (LASIX) 40 MG tablet Take 1 tablet (40 mg total) by mouth 2 (two) times daily.  60 tablet  1  . HYDROcodone-acetaminophen (NORCO/VICODIN) 5-325 MG per tablet Take 1-2 tablets by mouth every 4 (four) hours as needed for moderate pain.  60 tablet  0  . insulin aspart (NOVOLOG FLEXPEN) 100 UNIT/ML FlexPen Inject 4 Units into the skin 2 (two) times daily as needed for high blood sugar (cbg over 140).      . Insulin Detemir (LEVEMIR FLEXPEN) 100 UNIT/ML Pen Inject 35 Units into the skin at bedtime.      . iron polysaccharides (NIFEREX) 150 MG capsule Take 1 capsule (150 mg total) by mouth daily.  30 capsule  1  . metFORMIN (GLUCOPHAGE) 500 MG tablet Take 1 tablet (500 mg total) by mouth 2 (two) times daily.  60 tablet    . Multiple Vitamin (MULTIVITAMIN WITH MINERALS) TABS tablet Take 1 tablet by mouth daily.      . nitroGLYCERIN (NITRODUR - DOSED IN MG/24 HR) 0.2 mg/hr patch Place 0.4 mg onto the skin daily. Apply to upper part of foot      . potassium  chloride SA (K-DUR,KLOR-CON) 20 MEQ tablet Take 1 tablet (20 mEq total) by mouth 2 (two) times daily.  60 tablet  1  . pregabalin (LYRICA) 75 MG capsule Take 1 capsule (75 mg total) by mouth 2 (two) times daily.  60 capsule  1  . senna-docusate (SENOKOT-S) 8.6-50 MG per tablet Take 1 tablet by mouth 2 (two) times daily.  60 tablet  1  . spironolactone (ALDACTONE) 25 MG tablet Take 12.5 mg by mouth at bedtime.       . vitamin E (VITAMIN E) 1000 UNIT capsule Take 1,000 Units by mouth daily.        Home: Home Living Family/patient  expects to be discharged to:: Inpatient rehab Living Arrangements: Spouse/significant other Available Help at Discharge: Family;Available 24 hours/day Type of Home: House Home Access: Stairs to enter [employer] of Steps: 2 Entrance Stairs-Rails:  (Pt does not remember) Home Layout: One level Home Equipment: None Additional Comments: Pt report of home living information is questionable, does not match previous reports from prior hospital admission in May.  Pt drowsy and limited in participation today.  Functional History: Prior Function Comments: Pt unable to report on prior function due to drowsiness/nausea during therapy today. Functional Status:  Mobility: Bed Mobility Overal bed mobility: Needs Assistance;+2 for physical assistance Bed Mobility: Supine to Sit Supine to sit: Mod assist General bed mobility comments: Mod-A to power up to sitting from supine. Transfers Overall transfer level: Needs assistance Equipment used: None Transfers: Government social research officer transfers: Max assist;+2 physical assistance General transfer comment: Max-A +2 to slide pt over bed with bed pad.  Pt assists slightly with bil. UE to push on bed.  Max VC required to encourage pt to push through UEs.      ADL:    Cognition: Cognition Overall Cognitive Status: Difficult to assess Orientation Level: Oriented  X4 Cognition Arousal/Alertness: Lethargic (Question nausea meds) Behavior During Therapy: Flat affect Overall Cognitive Status: Difficult to assess Difficult to assess due to: Level of arousal  Blood pressure 110/54, pulse 87, temperature 98.3 F (36.8 C), temperature source Oral, resp. rate 16, height 5\' 9"  (1.753 m), weight 78.472 kg (173 lb), SpO2 100.00%. Physical Exam  HENT:  Head: Normocephalic.  Eyes: EOM are normal.  Neck: Normal range of motion. Neck supple. No thyromegaly present.  Cardiovascular: Normal rate and regular rhythm.   Respiratory: Effort normal and breath sounds normal. No respiratory distress.  GI: Soft. Bowel sounds are normal. He exhibits no distension.  Neurological: He is alert.  Patient was able follow commands as well as provide his name, age, date of birth.  Skin:  Left BKA is well healed. Right BKA with postoperative dressing appropriately tender  Motor 5/5 in BUE 4/5 in Bilateral HF  Results for orders placed during the hospital encounter of 01/30/14 (from the past 24 hour(s))  GLUCOSE, CAPILLARY     Status: Abnormal   Collection Time    01/31/14  6:53 AM      Result Value Ref Range   Glucose-Capillary 172 (*) 70 - 99 mg/dL  GLUCOSE, CAPILLARY     Status: Abnormal   Collection Time    01/31/14 11:08 AM      Result Value Ref Range   Glucose-Capillary 124 (*) 70 - 99 mg/dL   Comment 1 Notify RN     Comment 2 Documented in Chart    GLUCOSE, CAPILLARY     Status: Abnormal   Collection Time    01/31/14  4:02 PM      Result Value Ref Range   Glucose-Capillary 143 (*) 70 - 99 mg/dL   Comment 1 Notify RN     Comment 2 Documented in Chart    GLUCOSE, CAPILLARY     Status: Abnormal   Collection Time    01/31/14 10:17 PM      Result Value Ref Range   Glucose-Capillary 284 (*) 70 - 99 mg/dL   Dg Chest 2 View  01/30/2014   CLINICAL DATA:  Hypertension  EXAM: CHEST  2 VIEW  COMPARISON:  02/13/2013  FINDINGS: Borderline enlargement of the cardiac  silhouette. Normal mediastinal and hilar contours. Left anterior chest wall AICD  is stable and well positioned.  Clear lungs.  No pleural effusion.  No pneumothorax.  Bony thorax is demineralized but intact.  IMPRESSION: No acute cardiopulmonary disease.   Electronically Signed   By: Lajean Manes M.D.   On: 01/30/2014 12:42    Assessment/Plan: Diagnosis: New R BKA secondary to osteomyelitis post op day #2, in pt with recent L BKA  1. Does the need for close, 24 hr/day medical supervision in concert with the patient's rehab needs make it unreasonable for this patient to be served in a less intensive setting? Yes 2. Co-Morbidities requiring supervision/potential complications: CHF,PAF,DM 3. Due to bladder management, bowel management, safety, skin/wound care, disease management, medication administration, pain management and patient education, does the patient require 24 hr/day rehab nursing? Yes 4. Does the patient require coordinated care of a physician, rehab nurse, PT (1-2 hrs/day, 5 days/week) and OT (1-2 hrs/day, 5 days/week) to address physical and functional deficits in the context of the above medical diagnosis(es)? Yes Addressing deficits in the following areas: balance, endurance, locomotion, strength, transferring, bowel/bladder control, bathing, dressing and toileting 5. Can the patient actively participate in an intensive therapy program of at least 3 hrs of therapy per day at least 5 days per week? Yes 6. The potential for patient to make measurable gains while on inpatient rehab is good 7. Anticipated functional outcomes upon discharge from inpatient rehab are modified independent and WC level  with PT, modified independent with OT, n/a with SLP. 8. Estimated rehab length of stay to reach the above functional goals is: 7-10days 9. Does the patient have adequate social supports to accommodate these discharge functional goals? Potentially 10. Anticipated D/C setting: Home 11. Anticipated  post D/C treatments: [place] therapy 12. Overall Rehab/Functional Prognosis: good  RECOMMENDATIONS: This patient's condition is appropriate for continued rehabilitative care in the following setting: CIR Patient has agreed to participate in recommended program. Yes Note that insurance prior authorization may be required for reimbursement for recommended care.  Comment: If Left BK prosthesis is finished may initiate prosthetic training in hospital. Check with [facility]    02/01/2014

## 2014-02-01 NOTE — Progress Notes (Signed)
Inpatient Rehabilitation  I met with [name] [name] at the bedside and had a phone conversation with his wife (while in the pt.'s room) in regards to pt's post acute rehab options including CIR and SNF.  Wife requests that I pursue insurance authorization for admission to IP rehab. He was recently on IP rehab following his first surgery.   I will initiate insurance auth  process and will advise when I have heard back. My co-worker Danne Baxter will follow [name] [name] in my absence tomorrow and can be reached at [phone].  Please call if questions.  [facility] Admissions Coordinator Cell [phone] Office [phone]

## 2014-02-01 NOTE — Progress Notes (Signed)
   Subjective:  Patient reports pain as mild.  No events  Objective:   VITALS:   Filed Vitals:   01/31/14 0340 01/31/14 1430 01/31/14 2058 02/01/14 0601  BP: 121/69 118/66 110/54 100/58  Pulse: 89 85 87 94  Temp: 98.1 F (36.7 C) 98.6 F (37 C) 98.3 F (36.8 C) 98.5 F (36.9 C)  TempSrc: Oral  Oral Oral  Resp: 18 16 16 16   Height:      Weight:      SpO2: 100% 100% 100% 97%    Neurologically intact ABD soft Neurovascular intact Incision: dressing C/D/I and no drainage No cellulitis present Compartment soft   Lab Results  Component Value Date   WBC 9.0 01/31/2014   HGB 8.6* 01/31/2014   HCT 26.8* 01/31/2014   MCV 72.0* 01/31/2014   PLT 316 01/31/2014     Assessment/Plan:  2 Days Post-Op   - Expected postop acute blood loss anemia - will monitor for symptoms - Up with PT - recommend CIR - DVT ppx - SCDs, ambulation, asa, plavix - NWB right lower extremity - Pain well controlled - Discharge planning - CIR to evaluate patient today   Problem List Items Addressed This Visit     Musculoskeletal and Integument   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

## 2014-02-02 ENCOUNTER — Inpatient Hospital Stay (HOSPITAL_COMMUNITY)
Admission: RE | Admit: 2014-02-02 | Discharge: 2014-02-14 | DRG: 945 | Disposition: A | Discharge status: Home Health | Payer: Medicare Other | Source: Intra-hospital | Attending: Physical Medicine & Rehabilitation | Admitting: Physical Medicine & Rehabilitation

## 2014-02-02 DIAGNOSIS — I2510 Atherosclerotic heart disease of native coronary artery without angina pectoris: Secondary | ICD-10-CM | POA: Diagnosis present

## 2014-02-02 DIAGNOSIS — E669 Obesity, unspecified: Secondary | ICD-10-CM | POA: Diagnosis present

## 2014-02-02 DIAGNOSIS — K5900 Constipation, unspecified: Secondary | ICD-10-CM | POA: Diagnosis present

## 2014-02-02 DIAGNOSIS — Z833 Family history of diabetes mellitus: Secondary | ICD-10-CM | POA: Diagnosis not present

## 2014-02-02 DIAGNOSIS — I509 Heart failure, unspecified: Secondary | ICD-10-CM | POA: Diagnosis present

## 2014-02-02 DIAGNOSIS — E785 Hyperlipidemia, unspecified: Secondary | ICD-10-CM | POA: Diagnosis present

## 2014-02-02 DIAGNOSIS — I4891 Unspecified atrial fibrillation: Secondary | ICD-10-CM | POA: Diagnosis present

## 2014-02-02 DIAGNOSIS — Z95810 Presence of automatic (implantable) cardiac defibrillator: Secondary | ICD-10-CM | POA: Diagnosis not present

## 2014-02-02 DIAGNOSIS — K219 Gastro-esophageal reflux disease without esophagitis: Secondary | ICD-10-CM | POA: Diagnosis present

## 2014-02-02 DIAGNOSIS — I5022 Chronic systolic (congestive) heart failure: Secondary | ICD-10-CM | POA: Diagnosis present

## 2014-02-02 DIAGNOSIS — Z5189 Encounter for other specified aftercare: Secondary | ICD-10-CM | POA: Diagnosis present

## 2014-02-02 DIAGNOSIS — I10 Essential (primary) hypertension: Secondary | ICD-10-CM | POA: Diagnosis present

## 2014-02-02 DIAGNOSIS — Z89511 Acquired absence of right leg below knee: Secondary | ICD-10-CM

## 2014-02-02 DIAGNOSIS — D62 Acute posthemorrhagic anemia: Secondary | ICD-10-CM | POA: Diagnosis present

## 2014-02-02 DIAGNOSIS — M129 Arthropathy, unspecified: Secondary | ICD-10-CM | POA: Diagnosis present

## 2014-02-02 DIAGNOSIS — I447 Left bundle-branch block, unspecified: Secondary | ICD-10-CM | POA: Diagnosis present

## 2014-02-02 DIAGNOSIS — Z89512 Acquired absence of left leg below knee: Secondary | ICD-10-CM

## 2014-02-02 DIAGNOSIS — I998 Other disorder of circulatory system: Secondary | ICD-10-CM | POA: Diagnosis present

## 2014-02-02 DIAGNOSIS — S88119A Complete traumatic amputation at level between knee and ankle, unspecified lower leg, initial encounter: Secondary | ICD-10-CM

## 2014-02-02 DIAGNOSIS — E1149 Type 2 diabetes mellitus with other diabetic neurological complication: Secondary | ICD-10-CM | POA: Diagnosis present

## 2014-02-02 DIAGNOSIS — E7800 Pure hypercholesterolemia, unspecified: Secondary | ICD-10-CM | POA: Diagnosis present

## 2014-02-02 DIAGNOSIS — I428 Other cardiomyopathies: Secondary | ICD-10-CM | POA: Diagnosis present

## 2014-02-02 DIAGNOSIS — E1142 Type 2 diabetes mellitus with diabetic polyneuropathy: Secondary | ICD-10-CM | POA: Diagnosis present

## 2014-02-02 DIAGNOSIS — L98499 Non-pressure chronic ulcer of skin of other sites with unspecified severity: Secondary | ICD-10-CM

## 2014-02-02 DIAGNOSIS — I739 Peripheral vascular disease, unspecified: Secondary | ICD-10-CM | POA: Diagnosis present

## 2014-02-02 DIAGNOSIS — Z7982 Long term (current) use of aspirin: Secondary | ICD-10-CM | POA: Diagnosis not present

## 2014-02-02 DIAGNOSIS — Z993 Dependence on wheelchair: Secondary | ICD-10-CM | POA: Diagnosis not present

## 2014-02-02 DIAGNOSIS — Z794 Long term (current) use of insulin: Secondary | ICD-10-CM | POA: Diagnosis not present

## 2014-02-02 LAB — GLUCOSE, CAPILLARY
GLUCOSE-CAPILLARY: 89 mg/dL (ref 70–99)
GLUCOSE-CAPILLARY: 96 mg/dL (ref 70–99)
GLUCOSE-CAPILLARY: 228 mg/dL — AB (ref 70–99)
Glucose-Capillary: 169 mg/dL — ABNORMAL HIGH (ref 70–99)
Glucose-Capillary: 143 mg/dL — ABNORMAL HIGH (ref 70–99)

## 2014-02-02 LAB — BASIC METABOLIC PANEL
BUN: 12 mg/dL (ref 6–23)
CO2: 27 meq/L (ref 19–32)
CREATININE: 0.9 mg/dL (ref 0.50–1.35)
Calcium: 8.8 mg/dL (ref 8.4–10.5)
Chloride: 104 mEq/L (ref 96–112)
GFR calc Af Amer: >90 mL/min (ref >90)
GFR calc non Af Amer: 84 mL/min — ABNORMAL LOW (ref >90)
Glucose, Bld: 86 mg/dL (ref 70–99)
POTASSIUM: 4.1 meq/L (ref 3.7–5.3)
Sodium: 143 mEq/L (ref 137–147)

## 2014-02-02 MED ORDER — CLOPIDOGREL BISULFATE 75 MG PO TABS
75.0000 mg | ORAL_TABLET | Freq: Every day | ORAL | Status: DC
  Administered 2014-02-03 – 2014-02-14 (×12): 75 mg via ORAL
  Filled 2014-02-02 (×13): qty 1

## 2014-02-02 MED ORDER — VITAMIN E 180 MG (400 UNIT) PO CAPS
400.0000 [IU] | ORAL_CAPSULE | Freq: Every day | ORAL | Status: DC
  Administered 2014-02-03 – 2014-02-14 (×12): 400 [IU] via ORAL
  Filled 2014-02-02 (×14): qty 1

## 2014-02-02 MED ORDER — ASPIRIN 325 MG PO TBEC: 325.0000 mg | DELAYED_RELEASE_TABLET | Freq: Every day | ORAL | Status: DC

## 2014-02-02 MED ORDER — METHOCARBAMOL 500 MG PO TABS
500.0000 mg | ORAL_TABLET | Freq: Four times a day (QID) | ORAL | Status: DC | PRN
  Administered 2014-02-12: 500 mg via ORAL
  Filled 2014-02-02: qty 1

## 2014-02-02 MED ORDER — HYDROCODONE-ACETAMINOPHEN 5-325 MG PO TABS
1.0000 | ORAL_TABLET | ORAL | Status: DC | PRN
  Administered 2014-02-03 – 2014-02-09 (×10): 2 via ORAL
  Administered 2014-02-09 – 2014-02-10 (×3): 1 via ORAL
  Administered 2014-02-11 (×2): 2 via ORAL
  Administered 2014-02-12: 1 via ORAL
  Administered 2014-02-12 – 2014-02-13 (×2): 2 via ORAL
  Filled 2014-02-02: qty 2
  Filled 2014-02-02: qty 1
  Filled 2014-02-02 (×8): qty 2
  Filled 2014-02-02 (×2): qty 1
  Filled 2014-02-02 (×5): qty 2
  Filled 2014-02-02: qty 1
  Filled 2014-02-02: qty 2

## 2014-02-02 MED ORDER — SPIRONOLACTONE 12.5 MG HALF TABLET
12.5000 mg | ORAL_TABLET | Freq: Every day | ORAL | Status: DC
  Administered 2014-02-02 – 2014-02-13 (×12): 12.5 mg via ORAL
  Filled 2014-02-02 (×13): qty 1

## 2014-02-02 MED ORDER — POTASSIUM CHLORIDE CRYS ER 20 MEQ PO TBCR
20.0000 meq | EXTENDED_RELEASE_TABLET | Freq: Two times a day (BID) | ORAL | Status: DC
  Administered 2014-02-02 – 2014-02-14 (×24): 20 meq via ORAL
  Filled 2014-02-02 (×26): qty 1

## 2014-02-02 MED ORDER — ASPIRIN EC 325 MG PO TBEC
325.0000 mg | DELAYED_RELEASE_TABLET | Freq: Every day | ORAL | Status: DC
  Administered 2014-02-03 – 2014-02-14 (×12): 325 mg via ORAL
  Filled 2014-02-02 (×14): qty 1

## 2014-02-02 MED ORDER — ACETAMINOPHEN 325 MG PO TABS: 325.0000 mg | ORAL_TABLET | ORAL | Status: DC | PRN

## 2014-02-02 MED ORDER — CARVEDILOL 12.5 MG PO TABS
12.5000 mg | ORAL_TABLET | Freq: Two times a day (BID) | ORAL | Status: DC
  Administered 2014-02-03 – 2014-02-14 (×22): 12.5 mg via ORAL
  Filled 2014-02-02 (×25): qty 1

## 2014-02-02 MED ORDER — PREGABALIN 50 MG PO CAPS
75.0000 mg | ORAL_CAPSULE | Freq: Two times a day (BID) | ORAL | Status: DC
  Administered 2014-02-02 – 2014-02-14 (×23): 75 mg via ORAL
  Filled 2014-02-02 (×46): qty 1

## 2014-02-02 MED ORDER — ONDANSETRON HCL 4 MG/2ML IJ SOLN: 4.0000 mg | Freq: Four times a day (QID) | INTRAMUSCULAR | Status: DC | PRN

## 2014-02-02 MED ORDER — PANTOPRAZOLE SODIUM 40 MG PO TBEC
80.0000 mg | DELAYED_RELEASE_TABLET | Freq: Every day | ORAL | Status: DC
  Administered 2014-02-03 – 2014-02-13 (×11): 80 mg via ORAL
  Filled 2014-02-02 (×11): qty 2

## 2014-02-02 MED ORDER — VITAMIN E 45 MG (100 UNIT) PO CAPS: 1000.0000 [IU] | ORAL_CAPSULE | Freq: Every day | ORAL | Status: DC

## 2014-02-02 MED ORDER — ADULT MULTIVITAMIN W/MINERALS CH
1.0000 | ORAL_TABLET | Freq: Every day | ORAL | Status: DC
  Administered 2014-02-03 – 2014-02-14 (×12): 1 via ORAL
  Filled 2014-02-02 (×13): qty 1

## 2014-02-02 MED ORDER — ONDANSETRON HCL 4 MG PO TABS
4.0000 mg | ORAL_TABLET | Freq: Four times a day (QID) | ORAL | Status: DC | PRN
Start: 2014-02-02 — End: 2014-02-14
  Administered 2014-02-05 – 2014-02-09 (×2): 4 mg via ORAL
  Filled 2014-02-02 (×4): qty 1

## 2014-02-02 MED ORDER — POLYETHYLENE GLYCOL 3350 17 G PO PACK
17.0000 g | PACK | Freq: Every day | ORAL | Status: DC | PRN
  Filled 2014-02-02: qty 1

## 2014-02-02 MED ORDER — POLYSACCHARIDE IRON COMPLEX 150 MG PO CAPS
150.0000 mg | ORAL_CAPSULE | Freq: Every day | ORAL | Status: DC
  Administered 2014-02-03 – 2014-02-14 (×12): 150 mg via ORAL
  Filled 2014-02-02 (×13): qty 1

## 2014-02-02 MED ORDER — FUROSEMIDE 40 MG PO TABS
40.0000 mg | ORAL_TABLET | Freq: Two times a day (BID) | ORAL | Status: DC
  Administered 2014-02-03 – 2014-02-14 (×23): 40 mg via ORAL
  Filled 2014-02-02 (×26): qty 1

## 2014-02-02 MED ORDER — ATORVASTATIN CALCIUM 10 MG PO TABS
10.0000 mg | ORAL_TABLET | Freq: Every day | ORAL | Status: DC
  Administered 2014-02-03 – 2014-02-14 (×12): 10 mg via ORAL
  Filled 2014-02-02 (×15): qty 1

## 2014-02-02 MED ORDER — SORBITOL 70 % SOLN
30.0000 mL | Freq: Every day | Status: DC | PRN
  Administered 2014-02-10: 30 mL via ORAL
  Filled 2014-02-02: qty 30

## 2014-02-02 MED ORDER — SENNOSIDES-DOCUSATE SODIUM 8.6-50 MG PO TABS
1.0000 | ORAL_TABLET | Freq: Two times a day (BID) | ORAL | Status: DC
  Administered 2014-02-02 – 2014-02-03 (×3): 1 via ORAL
  Filled 2014-02-02 (×3): qty 1

## 2014-02-02 MED ORDER — INSULIN ASPART 100 UNIT/ML ~~LOC~~ SOLN
0.0000 [IU] | Freq: Three times a day (TID) | SUBCUTANEOUS | Status: DC
  Administered 2014-02-03 – 2014-02-04 (×4): 3 [IU] via SUBCUTANEOUS
  Administered 2014-02-04 (×2): 2 [IU] via SUBCUTANEOUS
  Administered 2014-02-05: 3 [IU] via SUBCUTANEOUS
  Administered 2014-02-05 (×2): 5 [IU] via SUBCUTANEOUS
  Administered 2014-02-06: 3 [IU] via SUBCUTANEOUS
  Administered 2014-02-06: 5 [IU] via SUBCUTANEOUS
  Administered 2014-02-07: 3 [IU] via SUBCUTANEOUS
  Administered 2014-02-07: 8 [IU] via SUBCUTANEOUS
  Administered 2014-02-07: 3 [IU] via SUBCUTANEOUS
  Administered 2014-02-08 (×2): 8 [IU] via SUBCUTANEOUS
  Administered 2014-02-09: 2 [IU] via SUBCUTANEOUS
  Administered 2014-02-09: 5 [IU] via SUBCUTANEOUS
  Administered 2014-02-10 (×2): 2 [IU] via SUBCUTANEOUS
  Administered 2014-02-10 – 2014-02-11 (×4): 3 [IU] via SUBCUTANEOUS
  Administered 2014-02-12: 2 [IU] via SUBCUTANEOUS
  Administered 2014-02-12: 5 [IU] via SUBCUTANEOUS
  Administered 2014-02-13 (×2): 3 [IU] via SUBCUTANEOUS
  Administered 2014-02-13: 5 [IU] via SUBCUTANEOUS

## 2014-02-02 MED ORDER — INSULIN DETEMIR 100 UNIT/ML ~~LOC~~ SOLN
35.0000 [IU] | Freq: Every day | SUBCUTANEOUS | Status: DC
  Administered 2014-02-02 – 2014-02-13 (×12): 35 [IU] via SUBCUTANEOUS
  Filled 2014-02-02 (×13): qty 0.35

## 2014-02-02 NOTE — H&P (Signed)
Physical Medicine and Rehabilitation Admission H&P      No chief complaint on file. : Chief Complaint:Leg pain   HPI: [name] [name] is a 68 y.o. right-handed male with history of nonischemic cardiomyopathy with ICD, hypertension, chronic systolic congestive heart failure, PAF, diabetes mellitus with peripheral neuropathy and left BKA secondary to osteomyelitis received inpatient rehabilitation services in March of 2015 and currently awaiting prosthesis. Patient lives with his wife primarily using a wheelchair prior to admission and a walker for stand pivot transfers. Admitted 01/30/2014 with nonhealing right foot ulcers and findings of osteomyelitis. No relief with conservative care. Underwent right transtibial amputation 01/30/2014 per Dr.Xu. Postoperative pain management. Acute blood loss anemia 8.6 and monitored. Aspirin and Plavix resumed postoperatively for history of CAD. Physical therapy evaluation completed 01/31/2014 with recommendations of physical medicine rehabilitation consult.Admit for a comprehensive rehab program.   Patient remembers new from prior rehabilitation stay. Wife is at bedside. She is happy he is coming to rehabilitation again. Patient denies any pains. Oral medications seems to be helping. No phantom limb pain. ROS Review of Systems   Cardiovascular: Positive for leg swelling.   Gastrointestinal: Positive for constipation.   GERD   Neurological: Positive for weakness.   Psychiatric/Behavioral: Positive for memory loss.   All other systems reviewed and are negative    Past Medical History   Diagnosis  Date   .  GERD (gastroesophageal reflux disease)     .  Obesity     .  Cardiomyopathy, nonischemic         a. Cath 2003: mild nonobstructive CAD, EF 25% at that time.   .  Hypertension     .  Chronic systolic CHF (congestive heart failure)         a. NICM EF 25% dating back to at least 2003.   .  PAF (paroxysmal atrial fibrillation)         a. Noted on  ICD interrogation 2012;  b. coumadin d/c'd 01/2013.   .  NSVT (nonsustained ventricular tachycardia)         a. Noted on ICD interrogation in 2011.   .  LBBB (left bundle branch block)     .  Critical lower limb ischemia     .  High cholesterol     .  PAD (peripheral artery disease)         a. 08/2013 Periph Angio/PTA: Abd Ao nl, RLE- 3v runoff, PT diff dzs, AT 90p, LLE 2v runoff, PT 100, AT 90m (diamondback ORA/chocolate balloon PTA).   .  Type II diabetes mellitus     .  Uncontrolled pain, Lt toe  09/21/2013   .  Gangrenous toe, Lt toe  09/21/2013   .  CAD (coronary artery disease)         a. Nonobstructive by cath 09/2001.   .  Automatic implantable cardioverter-defibrillator in situ         a. s/p BiV-ICD 2005, with generator change 06/2009 ([employer]).   .  Dementia     .  Arthritis     .  History of blood transfusion     .  Complication of anesthesia     .  PONV (postoperative nausea and vomiting)      Past Surgical History   Procedure  Laterality  Date   .  Lithotripsy    2001   .  Cervical spine surgery    1994   .  Cardiac defibrillator placement      06/2009       WITH GENERATOR REPLACED; BiV ICD   .  Us echocardiography    03/21/2008       EF 30-35%   .  Cardiovascular stress test    03/20/2009       EF 33%   .  Transluminal atherectomy tibial artery  Left  09/12/2013   .  Cardiac catheterization    10/01/2001       THERE WAS GLOBAL HYPOKINESIS AND EF 25%. THERE APPEARED TO BE GLOBAL DECREASE IN WALL MOTION   .  Toe amputation    10/04/2013       LEFT GREAT TOE AND 4TH TOE   /   DR XU   .  Amputation  Left  10/04/2013       Procedure: LEFT GREAT TOE AND SECOND TOE AMPUTATION;  Surgeon: Naiping Michael Xu, MD;  Location: [facility] OR;  Service: Orthopedics;  Laterality: Left;   .  Colonoscopy       .  Leg amputation below knee  Left  11/09/2013       DR XU   .  Amputation  Left  11/09/2013       Procedure: LEFT AMPUTATION BELOW KNEE;  Surgeon: Naiping Michael Xu, MD;  Location:  [facility] OR;  Service: Orthopedics;  Laterality: Left;   .  Coronary angioplasty       .  Amputation  Right  01/04/2014       Procedure: Great second and third toe amputation;  Surgeon: Naiping Michael Xu, MD;  Location: [facility] OR;  Service: Orthopedics;  Laterality: Right;   .  Amputation  Right  01/30/2014       Procedure: RIGHT BELOW KNEE AMPUTATION;  Surgeon: Naiping Michael Xu, MD;  Location: [facility] OR;  Service: Orthopedics;  Laterality: Right;    Family History   Problem  Relation  Age of Onset   .  Heart disease  Mother     .  Hypertension  Mother     .  Diabetes  Mother     .  Diabetes  Father      Social History: reports that he has never smoked. He has never used smokeless tobacco. He reports that he does not drink alcohol or use illicit drugs. Allergies: No Known Allergies Medications Prior to Admission   Medication  Sig  Dispense  Refill   .  aspirin EC 81 MG tablet  Take 1 tablet (81 mg total) by mouth daily.         .  atorvastatin (LIPITOR) 10 MG tablet  Take 10 mg by mouth daily.           .  carvedilol (COREG) 12.5 MG tablet  Take 12.5 mg by mouth 2 (two) times daily with a meal.         .  clopidogrel (PLAVIX) 75 MG tablet  Take 75 mg by mouth daily with breakfast.          .  esomeprazole (NEXIUM) 40 MG capsule  Take 40 mg by mouth daily as needed (acid reflux).          .  furosemide (LASIX) 40 MG tablet  Take 1 tablet (40 mg total) by mouth 2 (two) times daily.   60 tablet   1   .  HYDROcodone-acetaminophen (NORCO/VICODIN) 5-325 MG per tablet  Take 1-2 tablets by mouth every 4 (four) hours as needed for moderate pain.   60 tablet   0   .    insulin aspart (NOVOLOG FLEXPEN) 100 UNIT/ML FlexPen  Inject 4 Units into the skin 2 (two) times daily as needed for high blood sugar (cbg over 140).         .  Insulin Detemir (LEVEMIR FLEXPEN) 100 UNIT/ML Pen  Inject 35 Units into the skin at bedtime.         .  iron polysaccharides (NIFEREX) 150 MG capsule  Take 1 capsule (150 mg total) by mouth  daily.   30 capsule   1   .  metFORMIN (GLUCOPHAGE) 500 MG tablet  Take 1 tablet (500 mg total) by mouth 2 (two) times daily.   60 tablet      .  Multiple Vitamin (MULTIVITAMIN WITH MINERALS) TABS tablet  Take 1 tablet by mouth daily.         .  nitroGLYCERIN (NITRODUR - DOSED IN MG/24 HR) 0.2 mg/hr patch  Place 0.4 mg onto the skin daily. Apply to upper part of foot         .  potassium chloride SA (K-DUR,KLOR-CON) 20 MEQ tablet  Take 1 tablet (20 mEq total) by mouth 2 (two) times daily.   60 tablet   1   .  pregabalin (LYRICA) 75 MG capsule  Take 1 capsule (75 mg total) by mouth 2 (two) times daily.   60 capsule   1   .  senna-docusate (SENOKOT-S) 8.6-50 MG per tablet  Take 1 tablet by mouth 2 (two) times daily.   60 tablet   1   .  spironolactone (ALDACTONE) 25 MG tablet  Take 12.5 mg by mouth at bedtime.          .  vitamin E (VITAMIN E) 1000 UNIT capsule  Take 1,000 Units by mouth daily.            Home: Home Living Family/patient expects to be discharged to:: Inpatient rehab Living Arrangements: Spouse/significant other Available Help at Discharge: Family;Available 24 hours/day Type of Home: House Home Access: Stairs to enter Entrance Stairs-Number of Steps: 2 Entrance Stairs-Rails:  (Pt does not remember) Home Layout: One level Home Equipment: None Additional Comments: Pt report of home living information is questionable, does not match previous reports from prior hospital admission in May.  Pt drowsy and limited in participation today.    Functional History: Prior Function Comments: Pt unable to report on prior function due to drowsiness/nausea during therapy today.   Functional Status:   Mobility: Bed Mobility Overal bed mobility: Needs Assistance Bed Mobility: Supine to Sit Supine to sit: Mod assist;HOB elevated General bed mobility comments: Pt needed mod-A to finish powering up to sitting and requires sustained mod-A support to prevent fall  backward. Transfers Overall transfer level: Needs assistance Equipment used: None Transfers: Lateral/Scoot Transfers Anterior-Posterior transfers: Max assist;+2 physical assistance  Lateral/Scoot Transfers: Max assist General transfer comment: Pt was able to help slide laterally with bil. UEs, but needed max-A to slide him laterally over EOB to chair using bed pad.   ADL:   Cognition: Cognition Overall Cognitive Status: Within Functional Limits for tasks assessed Orientation Level: Oriented X4 Cognition Arousal/Alertness: Awake/alert Behavior During Therapy: WFL for tasks assessed/performed Overall Cognitive Status: Within Functional Limits for tasks assessed Difficult to assess due to: Level of arousal   Physical Exam: Blood pressure 109/66, pulse 97, temperature 99.4 F (37.4 C), temperature source Oral, resp. rate 16, height 5' 9" (1.753 m), weight 78.472 kg (173 lb), SpO2 98.00%. Physical Exam HENT:   Head: Normocephalic.     Eyes: EOM are normal.   Neck: Normal range of motion. Neck supple. No thyromegaly present.   Cardiovascular: Normal rate and regular rhythm.   Respiratory: Effort normal and breath sounds normal. No respiratory distress.   GI: Soft. Bowel sounds are normal. He exhibits no distension.  Neurological: He is alert.  Patient was able follow commands as well as provide his name, age, date of birth.   Skin:  Left BKA is well healed. Right BKA with postoperative dressing appropriately tender   Motor 5/5 in BUE   4/5 in Bilateral HF    Results for orders placed during the hospital encounter of 01/30/14 (from the past 48 hour(s))   GLUCOSE, CAPILLARY     Status: Abnormal     Collection Time      01/31/14 11:08 AM       Result  Value  Ref Range     Glucose-Capillary  124 (*)  70 - 99 mg/dL     Comment 1  Notify RN        Comment 2  Documented in Chart      GLUCOSE, CAPILLARY     Status: Abnormal     Collection Time      01/31/14  4:02 PM       Result   Value  Ref Range     Glucose-Capillary  143 (*)  70 - 99 mg/dL     Comment 1  Notify RN        Comment 2  Documented in Chart      GLUCOSE, CAPILLARY     Status: Abnormal     Collection Time      01/31/14 10:17 PM       Result  Value  Ref Range     Glucose-Capillary  284 (*)  70 - 99 mg/dL   BASIC METABOLIC PANEL     Status: Abnormal     Collection Time      02/01/14  6:45 AM       Result  Value  Ref Range     Sodium  141   137 - 147 mEq/L     Potassium  4.2   3.7 - 5.3 mEq/L     Chloride  104   96 - 112 mEq/L     CO2  26   19 - 32 mEq/L     Glucose, Bld  224 (*)  70 - 99 mg/dL     BUN  10   6 - 23 mg/dL     Creatinine, Ser  0.85   0.50 - 1.35 mg/dL     Calcium  8.1 (*)  8.4 - 10.5 mg/dL     GFR calc non Af Amer  86 (*)  >90 mL/min     GFR calc Af Amer  >90   >90 mL/min     Comment:  (NOTE)        The eGFR has been calculated using the CKD EPI equation.        This calculation has not been validated in all clinical situations.        eGFR's persistently <90 mL/min signify possible Chronic Kidney        Disease.   GLUCOSE, CAPILLARY     Status: Abnormal     Collection Time      02/01/14  7:12 AM       Result  Value  Ref Range     Glucose-Capillary  217 (*)  70 - 99   mg/dL   GLUCOSE, CAPILLARY     Status: Abnormal     Collection Time      02/01/14 11:10 AM       Result  Value  Ref Range     Glucose-Capillary  132 (*)  70 - 99 mg/dL     Comment 1  Notify RN      GLUCOSE, CAPILLARY     Status: Abnormal     Collection Time      02/01/14  4:08 PM       Result  Value  Ref Range     Glucose-Capillary  147 (*)  70 - 99 mg/dL     Comment 1  Notify RN      GLUCOSE, CAPILLARY     Status: Abnormal     Collection Time      02/01/14  9:15 PM       Result  Value  Ref Range     Glucose-Capillary  222 (*)  70 - 99 mg/dL     Comment 1  Notify RN      GLUCOSE, CAPILLARY     Status: None     Collection Time      02/02/14  6:23 AM       Result  Value  Ref Range     Glucose-Capillary   89   70 - 99 mg/dL     Comment 1  Notify RN       No results found.         Medical Problem List and Plan: 1. Functional deficits secondary to Right BKA 01/30/2014 and History L BKA March 2015 and await prosthesis per [employer] 2.  DVT Prophylaxis/Anticoagulation: N/A Bilat BKA 3. Pain Management:lyrica 75 mg BID, Hydrocodone,robaxin as needed 4. Acute Blood Loss anemia.Continue Niferex.Follow up CBC 5. Neuropsych: This patient is capable of making decisions on his own behalf. 6. Diabetes mellitus with peripheral neuropathy.Lattest hemoglobin A1c 8.6.Levemir 35 unit daily. Check CBG Ac/HS.Diabetic teaching 7.NICM/ICD.continue aspirin and plavix.no chest pain or shortness of breath 8.Hypertension.Coreg 12.5 mg daily.Monitor wiyh increased mobility 9.Chronic systolic CHF.Lasix 40 mg BID.Monitor for any signs of fluid overload. 10.Hyperlipidemia 10 mg daily 11.GERD.Protonix     Post Admission Physician Evaluation: Functional deficits secondary  to right below knee amputation on 01/30/2014 secondary to osteomyelitis in a patient with previous left below knee amputation in March of 2015. Patient is admitted to receive collaborative, interdisciplinary care between the physiatrist, rehab nursing staff, and therapy team. Patient's level of medical complexity and substantial therapy needs in context of that medical necessity cannot be provided at a lesser intensity of care such as a SNF. Patient has experienced substantial functional loss from his/her baseline which was documented above under the "Functional History" and "Functional Status" headings.  Judging by the patient's diagnosis, physical exam, and functional history, the patient has potential for functional progress which will result in measurable gains while on inpatient rehab.  These gains will be of substantial and practical use upon discharge  in facilitating mobility and self-care at the household level. Physiatrist will provide 24  hour management of medical needs as well as oversight of the therapy plan/treatment and provide guidance as appropriate regarding the interaction of the two. 24 hour rehab nursing will assist with bladder management, bowel management, safety, skin/wound care, disease management, medication administration, pain management and patient education  and help integrate therapy concepts, techniques,education, etc. PT will assess and treat for/with: pre gait, gait training, endurance , safety, equipment, neuromuscular   re education.   Goals are: Sup . OT will assess and treat for/with: ADLs, Cognitive perceptual skills, Neuromuscular re education, safety, endurance, equipment.   Goals are: Mod I. SLP will assess and treat for/with:  NA.  Goals are: NA. Case Management and Social Worker will assess and treat for psychological issues and discharge planning. Team conference will be held weekly to assess progress toward goals and to determine barriers to discharge. Patient will receive at least 3 hours of therapy per day at least 5 days per week. ELOS: 7-10d        Prognosis:  good  Andrew E. Kirsteins M.D. [facility] FAAPM&R (Sports Med, Neuromuscular Med) Diplomate Am Board of Electrodiagnostic Med  02/02/2014     

## 2014-02-02 NOTE — Discharge Summary (Signed)
Physician Discharge Summary      Patient ID: [name] [name] MRN: [id number] DOB/AGE: 03-17-1946 68 y.o.  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 GERD (gastroesophageal reflux disease)   . Obesity   . Cardiomyopathy, nonischemic     a. Cath 2003: mild nonobstructive CAD, EF 25% at that time.  . Hypertension   . Chronic systolic CHF (congestive heart failure)     a. NICM EF 25% dating back to at least 2003.  [name] PAF (paroxysmal atrial fibrillation)     a. Noted on ICD interrogation 2012;  b. coumadin d/c'd 01/2013.  [name] NSVT (nonsustained ventricular tachycardia)     a. Noted on ICD interrogation in 2011.  [name] LBBB (left bundle branch block)   . Critical lower limb ischemia   . High cholesterol   . PAD (peripheral artery disease)     a. 08/2013 Periph Angio/PTA: Abd Ao nl, RLE- 3v runoff, PT diff dzs, AT 90p, LLE 2v runoff, PT 100, AT 79m (diamondback ORA/chocolate balloon PTA).  . Type II diabetes mellitus   . Uncontrolled pain, Lt toe 09/21/2013  . Gangrenous toe, Lt toe 09/21/2013  . CAD (coronary artery disease)     a. Nonobstructive by cath 09/2001.  [name] Automatic implantable cardioverter-defibrillator in situ     a. 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  . Dementia   . Arthritis   . History of blood transfusion   . Complication of anesthesia   . PONV (postoperative nausea and vomiting)     Surgeries: Procedure(s): RIGHT BELOW KNEE AMPUTATION on 01/30/2014   Consultants (if any):    Discharged Condition: Improved  Hospital Course: [name] [name] is an 68 y.o. male who was admitted 01/30/2014 with a diagnosis of right foot nonhealing ischemic ulcers and went to the operating room on 01/30/2014 and underwent the above named  procedures.    He was given perioperative antibiotics:      Anti-infectives   Start     Dose/Rate Route Frequency Ordered Stop   01/30/14 1930  ceFAZolin (ANCEF) IVPB 2 g/50 mL premix     2 g 100 mL/hr over 30 Minutes Intravenous Every 6 hours 01/30/14 1833 01/31/14 0821   01/30/14 0600  ceFAZolin (ANCEF) IVPB 2 g/50 mL premix     2 g 100 mL/hr over 30 Minutes Intravenous On call to O.R. 01/29/14 1245 01/30/14 1525    .  He was given sequential compression devices, early ambulation, and aspirin and plavix for DVT prophylaxis.  He benefited maximally from the hospital stay and there were no complications.  He was able to do physical therapy without problems.  Recent vital signs:  Filed Vitals:   02/02/14 0600  BP: 109/66  Pulse: 97  Temp: 99.4 F (37.4 C)  Resp: 16    Recent laboratory studies:  Lab Results  Component Value Date   HGB 8.6* 01/31/2014   HGB 9.9* 01/30/2014   HGB 9.0* 01/06/2014   Lab Results  Component Value Date   WBC 9.0 01/31/2014   PLT 316 01/31/2014   Lab Results  Component Value Date   INR 1.02 12/27/2013   Lab Results  Component Value Date   NA 143 02/02/2014   K 4.1 02/02/2014   CL 104 02/02/2014  CO2 27 02/02/2014   BUN 12 02/02/2014   CREATININE 0.90 02/02/2014   GLUCOSE 86 02/02/2014    Discharge Medications:     Medication List    ASK your doctor about these medications       aspirin EC 81 MG tablet  Take 1 tablet (81 mg total) by mouth daily.     atorvastatin 10 MG tablet  Commonly known as:  LIPITOR  Take 10 mg by mouth daily.     carvedilol 12.5 MG tablet  Commonly known as:  COREG  Take 12.5 mg by mouth 2 (two) times daily with a meal.     clopidogrel 75 MG tablet  Commonly known as:  PLAVIX  Take 75 mg by mouth daily with breakfast.     esomeprazole 40 MG capsule  Commonly known as:  NEXIUM  Take 40 mg by mouth daily as needed (acid reflux).     furosemide 40 MG tablet  Commonly known as:  LASIX  Take 1 tablet (40 mg  total) by mouth 2 (two) times daily.     HYDROcodone-acetaminophen 5-325 MG per tablet  Commonly known as:  NORCO/VICODIN  Take 1-2 tablets by mouth every 4 (four) hours as needed for moderate pain.  Ask about: Which instructions should I use?     HYDROcodone-acetaminophen 5-325 MG per tablet  Commonly known as:  NORCO  Take 1-2 tablets by mouth every 6 (six) hours as needed.  Ask about: Which instructions should I use?     iron polysaccharides 150 MG capsule  Commonly known as:  NIFEREX  Take 1 capsule (150 mg total) by mouth daily.     LEVEMIR FLEXPEN 100 UNIT/ML Pen  Generic drug:  Insulin Detemir  Inject 35 Units into the skin at bedtime.     metFORMIN 500 MG tablet  Commonly known as:  GLUCOPHAGE  Take 1 tablet (500 mg total) by mouth 2 (two) times daily.     multivitamin with minerals Tabs tablet  Take 1 tablet by mouth daily.     nitroGLYCERIN 0.2 mg/hr patch  Commonly known as:  NITRODUR - Dosed in mg/24 hr  Place 0.4 mg onto the skin daily. Apply to upper part of foot     NOVOLOG FLEXPEN 100 UNIT/ML FlexPen  Generic drug:  insulin aspart  Inject 4 Units into the skin 2 (two) times daily as needed for high blood sugar (cbg over 140).     potassium chloride SA 20 MEQ tablet  Commonly known as:  K-DUR,KLOR-CON  Take 1 tablet (20 mEq total) by mouth 2 (two) times daily.     pregabalin 75 MG capsule  Commonly known as:  LYRICA  Take 1 capsule (75 mg total) by mouth 2 (two) times daily.     senna-docusate 8.6-50 MG per tablet  Commonly known as:  Senokot-S  Take 1 tablet by mouth 2 (two) times daily.     spironolactone 25 MG tablet  Commonly known as:  ALDACTONE  Take 12.5 mg by mouth at bedtime.     vitamin E 1000 UNIT capsule  Generic drug:  vitamin E  Take 1,000 Units by mouth daily.        Diagnostic Studies: Dg Chest 2 View  01/30/2014   CLINICAL DATA:  Hypertension  EXAM: CHEST  2 VIEW  COMPARISON:  02/13/2013  FINDINGS: Borderline enlargement of the  cardiac silhouette. Normal mediastinal and hilar contours. Left anterior chest wall AICD is stable and well positioned.  Clear lungs.  No pleural effusion.  No pneumothorax.  Bony thorax is demineralized but intact.  IMPRESSION: No acute cardiopulmonary disease.   Electronically Signed   By: Lajean Manes M.D.   On: 01/30/2014 12:42    Disposition: 06-Home-Health Care Svc  Discharge Instructions   Non weight bearing    Complete by:  As directed            Follow-up Information   Follow up with Marianna Payment, MD In 3 weeks.   Specialty:  Orthopedic Surgery   Contact information:   [place] [id number] [phone]        Signed: Marianna Payment 02/02/2014, 2:07 PM

## 2014-02-02 NOTE — Progress Notes (Signed)
Patient received from 5N; wife at bedside.  Alert and oriented x4; complains of pain in right BKA but was recently medicated prior to coming to the unit.  Vitals obtained.  Low grade temp of 100.8; Pam Love, PA notified and orders incentive spirometer.  Will continue to monitor.

## 2014-02-02 NOTE — Progress Notes (Signed)
I await insurance approval for a possible admission to inpt rehab today or tomorrow. Pt is aware and in agreement. [phone]

## 2014-02-02 NOTE — Progress Notes (Signed)
Physical Medicine and Rehabilitation Consult  Reason for Consult: Right transtibial amputation  Referring Physician: Dr.Xu  HPI: [name] [name] is a 69 y.o. right-handed male with history of nonischemic cardiomyopathy with ICD, hypertension, chronic systolic congestive heart failure, PAF, diabetes mellitus with peripheral neuropathy and left BKA secondary to osteomyelitis received inpatient rehabilitation services in March of 2015 and currently awaiting prosthesis. Patient lives with his wife primarily using a wheelchair prior to admission and a walker for stand pivot transfers. Admitted 01/30/2014 with nonhealing right foot ulcers and findings of osteomyelitis. No relief with conservative care. Underwent right transtibial amputation 01/30/2014 per Dr.Xu. Postoperative pain management. Acute blood loss anemia 8.6 and monitored. Aspirin and Plavix resumed postoperatively for history of CAD. Physical therapy evaluation completed 01/31/2014 with recommendations of physical medicine rehabilitation consult.  Pt remembers me from prior rehab stay. States he was measured for L BK prosthesis , not sure whether it is finished  Review of Systems  Cardiovascular: Positive for leg swelling.  Gastrointestinal: Positive for constipation.  GERD  Neurological: Positive for weakness.  Psychiatric/Behavioral: Positive for memory loss.  All other systems reviewed and are negative.   Past Medical History   Diagnosis  Date   .  GERD (gastroesophageal reflux disease)    .  Obesity    .  Cardiomyopathy, nonischemic      a. Cath 2003: mild nonobstructive CAD, EF 25% at that time.   .  Hypertension    .  Chronic systolic CHF (congestive heart failure)      a. NICM EF 25% dating back to at least 2003.   [name]  PAF (paroxysmal atrial fibrillation)      a. Noted on ICD interrogation 2012; b. coumadin d/c'd 01/2013.   [name]  NSVT (nonsustained ventricular tachycardia)      a. Noted on ICD interrogation in 2011.   [name]  LBBB  (left bundle branch block)    .  Critical lower limb ischemia    .  High cholesterol    .  PAD (peripheral artery disease)      a. 08/2013 Periph Angio/PTA: Abd Ao nl, RLE- 3v runoff, PT diff dzs, AT 90p, LLE 2v runoff, PT 100, AT 88m (diamondback ORA/chocolate balloon PTA).   .  Type II diabetes mellitus    .  Uncontrolled pain, Lt toe  09/21/2013   .  Gangrenous toe, Lt toe  09/21/2013   .  CAD (coronary artery disease)      a. Nonobstructive by cath 09/2001.   [name]  Automatic implantable cardioverter-defibrillator in situ      a. 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   .  Dementia    .  Arthritis    .  History of blood transfusion    .  Complication of anesthesia    .  PONV (postoperative nausea and vomiting)     Past Surgical History   Procedure  Laterality  Date   .  Lithotripsy   2001   .  Cervical spine surgery   1994   .  Cardiac defibrillator placement   06/2009     WITH GENERATOR REPLACED; BiV ICD   .  US echocardiography   03/21/2008     EF 30-35%   .  Cardiovascular stress test   03/20/2009     EF 33%   .  Transluminal atherectomy tibial artery  Left  09/12/2013   .  Cardiac catheterization   10/01/2001     THERE  WAS GLOBAL HYPOKINESIS AND EF 25%. THERE APPEARED TO BE GLOBAL DECREASE IN WALL MOTION   .  Toe amputation   10/04/2013     LEFT GREAT TOE AND 4TH TOE / DR Erlinda Hong   .  Amputation  Left  10/04/2013     Procedure: LEFT GREAT TOE AND SECOND TOE AMPUTATION; Surgeon: Marianna Payment, MD; Location: [facility]; Service: Orthopedics; Laterality: Left;   .  Colonoscopy     .  Leg amputation below knee  Left  11/09/2013     DR Erlinda Hong   .  Amputation  Left  11/09/2013     Procedure: LEFT AMPUTATION BELOW KNEE; Surgeon: Marianna Payment, MD; Location: [facility]; Service: Orthopedics; Laterality: Left;   .  Coronary angioplasty     .  Amputation  Right  01/04/2014     Procedure: Doristine Devoid second and third toe amputation; Surgeon: Marianna Payment, MD; Location: [facility]; Service: Orthopedics; Laterality: Right;   .  Amputation  Right  01/30/2014     Procedure: RIGHT BELOW KNEE AMPUTATION; Surgeon: Marianna Payment, MD; Location: [facility]; Service: Orthopedics; Laterality: Right;    Family History   Problem  Relation  Age of Onset   .  Heart disease  Mother    .  Hypertension  Mother    .  Diabetes  Mother    .  Diabetes  Father     Social History: reports that he has never smoked. He has never used smokeless tobacco. He reports that he does not drink alcohol or use illicit drugs.  Allergies: No Known Allergies  Medications Prior to Admission   Medication  Sig  Dispense  Refill   .  aspirin EC 81 MG tablet  Take 1 tablet (81 mg total) by mouth daily.     [name]  atorvastatin (LIPITOR) 10 MG tablet  Take 10 mg by mouth daily.     .  carvedilol (COREG) 12.5 MG tablet  Take 12.5 mg by mouth 2 (two) times daily with a meal.     .  clopidogrel (PLAVIX) 75 MG tablet  Take 75 mg by mouth daily with breakfast.     .  esomeprazole (NEXIUM) 40 MG capsule  Take 40 mg by mouth daily as needed (acid reflux).     .  furosemide (LASIX) 40 MG tablet  Take 1 tablet (40 mg total) by mouth 2 (two) times daily.  60 tablet  1   .  HYDROcodone-acetaminophen (NORCO/VICODIN) 5-325 MG per tablet  Take 1-2 tablets by mouth every 4 (four) hours as needed for moderate pain.  60 tablet  0   .  insulin aspart (NOVOLOG FLEXPEN) 100 UNIT/ML FlexPen  Inject 4 Units into the skin 2 (two) times daily as needed for high blood sugar (cbg over 140).     .  Insulin Detemir (LEVEMIR FLEXPEN) 100 UNIT/ML Pen  Inject 35 Units into the skin at bedtime.     .  iron polysaccharides (NIFEREX) 150 MG capsule  Take 1 capsule (150 mg total) by mouth daily.  30 capsule  1   .  metFORMIN (GLUCOPHAGE) 500 MG tablet  Take 1 tablet (500 mg total) by mouth 2 (two) times daily.  60 tablet    .  Multiple Vitamin (MULTIVITAMIN WITH MINERALS) TABS tablet  Take 1 tablet by mouth daily.     .  nitroGLYCERIN (NITRODUR -  DOSED IN MG/24 HR) 0.2 mg/hr patch  Place 0.4 mg onto the  skin daily. Apply to upper part of foot     .  potassium chloride SA (K-DUR,KLOR-CON) 20 MEQ tablet  Take 1 tablet (20 mEq total) by mouth 2 (two) times daily.  60 tablet  1   .  pregabalin (LYRICA) 75 MG capsule  Take 1 capsule (75 mg total) by mouth 2 (two) times daily.  60 capsule  1   .  senna-docusate (SENOKOT-S) 8.6-50 MG per tablet  Take 1 tablet by mouth 2 (two) times daily.  60 tablet  1   .  spironolactone (ALDACTONE) 25 MG tablet  Take 12.5 mg by mouth at bedtime.     .  vitamin E (VITAMIN E) 1000 UNIT capsule  Take 1,000 Units by mouth daily.      Home:  Home Living  Family/patient expects to be discharged to:: Inpatient rehab  Living Arrangements: Spouse/significant other  Available Help at Discharge: Family;Available 24 hours/day  Type of Home: House  Home Access: Stairs to enter  [employer] of Steps: 2  Entrance Stairs-Rails: (Pt does not remember)  Home Layout: One level  Home Equipment: None  Additional Comments: Pt report of home living information is questionable, does not match previous reports from prior hospital admission in May. Pt drowsy and limited in participation today.  Functional History:  Prior Function  Comments: Pt unable to report on prior function due to drowsiness/nausea during therapy today.  Functional Status:  Mobility:  Bed Mobility  Overal bed mobility: Needs Assistance;+2 for physical assistance  Bed Mobility: Supine to Sit  Supine to sit: Mod assist  General bed mobility comments: Mod-A to power up to sitting from supine.  Transfers  Overall transfer level: Needs assistance  Equipment used: None  Transfers: Therapist, art transfers: Max assist;+2 physical assistance  General transfer comment: Max-A +2 to slide pt over bed with bed pad. Pt assists slightly with bil. UE to push on bed. Max VC required to encourage pt to push through UEs.     ADL:   Cognition:  Cognition  Overall Cognitive Status: Difficult to assess  Orientation Level: Oriented X4  Cognition  Arousal/Alertness: Lethargic (Question nausea meds)  Behavior During Therapy: Flat affect  Overall Cognitive Status: Difficult to assess  Difficult to assess due to: Level of arousal  Blood pressure 110/54, pulse 87, temperature 98.3 F (36.8 C), temperature source Oral, resp. rate 16, height 5\' 9"  (1.753 m), weight 78.472 kg (173 lb), SpO2 100.00%.  Physical Exam  HENT:  Head: Normocephalic.  Eyes: EOM are normal.  Neck: Normal range of motion. Neck supple. No thyromegaly present.  Cardiovascular: Normal rate and regular rhythm.  Respiratory: Effort normal and breath sounds normal. No respiratory distress.  GI: Soft. Bowel sounds are normal. He exhibits no distension.  Neurological: He is alert.  Patient was able follow commands as well as provide his name, age, date of birth.  Skin:  Left BKA is well healed. Right BKA with postoperative dressing appropriately tender  Motor 5/5 in BUE  4/5 in Bilateral HF  Results for orders placed during the hospital encounter of 01/30/14 (from the past 24 hour(s))   GLUCOSE, CAPILLARY Status: Abnormal    Collection Time    01/31/14 6:53 AM   Result  Value  Ref Range    Glucose-Capillary  172 (*)  70 - 99 mg/dL   GLUCOSE, CAPILLARY Status: Abnormal    Collection Time    01/31/14 11:08 AM   Result  Value  Ref Range  Glucose-Capillary  124 (*)  70 - 99 mg/dL    Comment 1  Notify RN     Comment 2  Documented in Chart    GLUCOSE, CAPILLARY Status: Abnormal    Collection Time    01/31/14 4:02 PM   Result  Value  Ref Range    Glucose-Capillary  143 (*)  70 - 99 mg/dL    Comment 1  Notify RN     Comment 2  Documented in Chart    GLUCOSE, CAPILLARY Status: Abnormal    Collection Time    01/31/14 10:17 PM   Result  Value  Ref Range    Glucose-Capillary  284 (*)  70 - 99 mg/dL    Dg Chest 2 View  01/30/2014  CLINICAL DATA: Hypertension EXAM: CHEST 2 VIEW COMPARISON: 02/13/2013 FINDINGS: Borderline enlargement of the cardiac silhouette. Normal mediastinal and hilar contours. Left anterior chest wall AICD is stable and well positioned. Clear lungs. No pleural effusion. No pneumothorax. Bony thorax is demineralized but intact. IMPRESSION: No acute cardiopulmonary disease. Electronically Signed By: Lajean Manes M.D. On: 01/30/2014 12:42   Assessment/Plan:  Diagnosis: New R BKA secondary to osteomyelitis post op day #2, in pt with recent L BKA  1. Does the need for close, 24 hr/day medical supervision in concert with the patient's rehab needs make it unreasonable for this patient to be served in a less intensive setting? Yes 2. Co-Morbidities requiring supervision/potential complications: CHF,PAF,DM 3. Due to bladder management, bowel management, safety, skin/wound care, disease management, medication administration, pain management and patient education, does the patient require 24 hr/day rehab nursing? Yes 4. Does the patient require coordinated care of a physician, rehab nurse, PT (1-2 hrs/day, 5 days/week) and OT (1-2 hrs/day, 5 days/week) to address physical and functional deficits in the context of the above medical diagnosis(es)? Yes Addressing deficits in the following areas: balance, endurance, locomotion, strength, transferring, bowel/bladder control, bathing, dressing and toileting 5. Can the patient actively participate in an intensive therapy program of at least 3 hrs of therapy per day at least 5 days per week? Yes 6. The potential for patient to make measurable gains while on inpatient rehab is good 7. Anticipated functional outcomes upon discharge from inpatient rehab are modified independent and WC level with PT, modified independent with OT, n/a with SLP. 8. Estimated rehab length of stay to reach the above functional goals is: 7-10days 9. Does the patient have adequate social supports to  accommodate these discharge functional goals? Potentially 10. Anticipated D/C setting: Home 11. Anticipated post D/C treatments: [place] therapy 12. Overall Rehab/Functional Prognosis: good RECOMMENDATIONS:  This patient's condition is appropriate for continued rehabilitative care in the following setting: CIR  Patient has agreed to participate in recommended program. Yes  Note that insurance prior authorization may be required for reimbursement for recommended care.  Comment: If Left BK prosthesis is finished may initiate prosthetic training in hospital. Check with [facility]  02/01/2014

## 2014-02-02 NOTE — PMR Pre-admission (Signed)
PMR Admission Coordinator Pre-Admission Assessment  [name] is an 68 y.o., male MRN: [id number] DOB: [birth year] Height: $RemoveBeforeDE'5\' 9"'[id number]$  (175.3 cm) Weight: 78.472 kg (173 lb)              Insurance Information HMO: yes    PPO:      PCP:      IPA:      80/20:      OTHER: medicare advantage plan PRIMARY: A[employer] Medicare      Policy#: [id number]      Subscriber: pt CM Name: Santiago Glad      Phone#: [phone] ext [phone]     Fax#: [phone] Pre-Cert#: [id number]      Employer: retired to be followed by onsite reviewer, [name] at [phone] Benefits:  Phone #: [phone]     Name: 02/01/14 Eff. Date: 08/25/13     Deduct: none      Out of Pocket Max: $4900/ met      Life Max: none CIR: $345 per day, days 1-5      SNF: no copay days 1-20; $155 per day days 21-51; no copay days 53-100 Outpatient: $40 per visit     Co-Pay: no visit limit Home Health: 100%      Co-Pay: no visit limit DME: 80%     Co-Pay: 20% Providers: in network  SECONDARY: none       Medicaid Application Date:       Case Manager:  Disability Application Date:       Case Worker:   Emergency Facilities manager Information   Name Relation Home Work [place] Spouse [phone]  [phone]     Current Medical History  Patient Admitting Diagnosis: 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 right-handed male with history of nonischemic cardiomyopathy with ICD, hypertension, chronic systolic congestive heart failure, PAF, diabetes mellitus with peripheral neuropathy and left BKA secondary to osteomyelitis received inpatient rehabilitation services in March of 2015 and currently awaiting prosthesis.   Patient lives with his wife primarily using a wheelchair prior to admission and a walker for stand pivot transfers. Admitted 01/30/2014 with nonhealing right foot ulcers and findings of osteomyelitis. No relief  with conservative care. Underwent right transtibial amputation 01/30/2014 per Dr.Xu. Postoperative pain management. Acute blood loss anemia 8.6 and monitored. Aspirin and Plavix resumed postoperatively for history of CAD.   Past Medical History  Past Medical History  Diagnosis Date  . GERD (gastroesophageal reflux disease)   . Obesity   . Cardiomyopathy, nonischemic     a. Cath 2003: mild nonobstructive CAD, EF 25% at that time.  . Hypertension   . Chronic systolic CHF (congestive heart failure)     a. NICM EF 25% dating back to at least 2003.  [name] PAF (paroxysmal atrial fibrillation)     a. Noted on ICD interrogation 2012;  b. coumadin d/c'd 01/2013.  [name] NSVT (nonsustained ventricular tachycardia)     a. Noted on ICD interrogation in 2011.  [name] LBBB (left bundle branch block)   . Critical lower limb ischemia   . High cholesterol   . PAD (peripheral artery disease)     a. 08/2013 Periph Angio/PTA: Abd Ao nl, RLE- 3v runoff, PT diff dzs, AT 90p, LLE 2v runoff, PT 100, AT 84m (diamondback ORA/chocolate balloon PTA).  . Type II diabetes mellitus   . Uncontrolled pain, Lt toe 09/21/2013  . Gangrenous  toe, Lt toe 09/21/2013  . CAD (coronary artery disease)     a. Nonobstructive by cath 09/2001.  [name] Automatic implantable cardioverter-defibrillator in situ     a. 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  . Dementia   . Arthritis   . History of blood transfusion   . Complication of anesthesia   . PONV (postoperative nausea and vomiting)     Family History  family history includes Diabetes in his father and mother; Heart disease in his mother; Hypertension in his mother.  Prior Rehab/Hospitalizations: CIR 10/2013 and d/c home at supervision level; insurance denied readmission 12/2013 after RLE toe s amputated. Pt went home with [facility]. Has been to [facility] and [facility] previously   Current Medications  Current facility-administered medications:0.9 %  sodium chloride  infusion, , Intravenous, Continuous, Naiping Eduard Roux, MD;  aspirin EC tablet 325 mg, 325 mg, Oral, Daily, Naiping Eduard Roux, MD, 325 mg at 02/02/14 7672;  atorvastatin (LIPITOR) tablet 10 mg, 10 mg, Oral, Daily, Naiping Eduard Roux, MD, 10 mg at 02/02/14 0958;  carvedilol (COREG) tablet 12.5 mg, 12.5 mg, Oral, BID WC, Naiping Eduard Roux, MD, 12.5 mg at 02/02/14 0810 clopidogrel (PLAVIX) tablet 75 mg, 75 mg, Oral, Q breakfast, Naiping Eduard Roux, MD, 75 mg at 02/02/14 0810;  diphenhydrAMINE (BENADRYL) 12.5 MG/5ML elixir 25 mg, 25 mg, Oral, Q4H PRN, Marianna Payment, MD;  furosemide (LASIX) tablet 40 mg, 40 mg, Oral, BID, Naiping Eduard Roux, MD, 40 mg at 02/02/14 0810 HYDROcodone-acetaminophen (NORCO/VICODIN) 5-325 MG per tablet 1-2 tablet, 1-2 tablet, Oral, Q4H PRN, Marianna Payment, MD, 2 tablet at 01/31/14 1037;  insulin aspart (novoLOG) injection 0-15 Units, 0-15 Units, Subcutaneous, TID WC, Naiping Eduard Roux, MD, 3 Units at 02/02/14 1220;  insulin aspart (novoLOG) injection 0-5 Units, 0-5 Units, Subcutaneous, QHS, Naiping Eduard Roux, MD, 2 Units at 02/01/14 2130 insulin detemir (LEVEMIR) injection 35 Units, 35 Units, Subcutaneous, QHS, Naiping Eduard Roux, MD, 35 Units at 02/01/14 2130;  iron polysaccharides (NIFEREX) capsule 150 mg, 150 mg, Oral, Daily, Naiping Eduard Roux, MD, 150 mg at 02/02/14 0947;  lactated ringers infusion, , Intravenous, Continuous, Fulton Reek, MD, Last Rate: 10 mL/hr at 01/30/14 1248 methocarbamol (ROBAXIN) 500 mg in dextrose 5 % 50 mL IVPB, 500 mg, Intravenous, Q6H PRN, Naiping Eduard Roux, MD;  methocarbamol (ROBAXIN) tablet 500 mg, 500 mg, Oral, Q6H PRN, Marianna Payment, MD, 500 mg at 02/02/14 0813;  metoCLOPramide (REGLAN) injection 5-10 mg, 5-10 mg, Intravenous, Q8H PRN, Naiping Eduard Roux, MD, 10 mg at 01/30/14 1856 metoCLOPramide (REGLAN) tablet 5-10 mg, 5-10 mg, Oral, Q8H PRN, Marianna Payment, MD, 10 mg at 01/31/14 1155;  morphine 2 MG/ML injection 1 mg, 1  mg, Intravenous, Q2H PRN, Naiping Eduard Roux, MD, 1 mg at 01/31/14 0048;  multivitamin with minerals tablet 1 tablet, 1 tablet, Oral, Daily, Naiping Eduard Roux, MD, 1 tablet at 02/02/14 0958;  ondansetron [facility]) injection 4 mg, 4 mg, Intravenous, Q6H PRN, Marianna Payment, MD, 4 mg at 01/31/14 0830 ondansetron (ZOFRAN) tablet 4 mg, 4 mg, Oral, Q6H PRN, Naiping Eduard Roux, MD;  oxyCODONE (Oxy IR/ROXICODONE) immediate release tablet 5-15 mg, 5-15 mg, Oral, Q3H PRN, Marianna Payment, MD, 15 mg at 02/02/14 0813;  pantoprazole (PROTONIX) EC tablet 80 mg, 80 mg, Oral, Q1200, Naiping Eduard Roux, MD, 80 mg at 02/02/14 1220;  polyethylene glycol (MIRALAX / GLYCOLAX) packet 17 g, 17 g, Oral, Daily PRN, Naiping Eduard Roux, MD potassium chloride SA (K-DUR,KLOR-CON) CR  tablet 20 mEq, 20 mEq, Oral, BID, Naiping Eduard Roux, MD, 20 mEq at 02/02/14 0958;  pregabalin (LYRICA) capsule 75 mg, 75 mg, Oral, BID, Naiping Eduard Roux, MD, 75 mg at 02/02/14 9518;  senna-docusate (Senokot-S) tablet 1 tablet, 1 tablet, Oral, BID, Naiping Eduard Roux, MD, 1 tablet at 02/02/14 0958;  sorbitol 70 % solution 30 mL, 30 mL, Oral, Daily PRN, Marianna Payment, MD spironolactone (ALDACTONE) tablet 12.5 mg, 12.5 mg, Oral, QHS, Naiping Eduard Roux, MD, 12.5 mg at 02/01/14 2129;  vitamin E capsule 1,000 Units, 1,000 Units, Oral, Daily, Naiping Eduard Roux, MD, 1,000 Units at 02/02/14 8416  Patients Current Diet: Carb Control  Precautions / Restrictions Precautions Precautions: Fall Precaution Comments: Discussed precautions with pt. Other Brace/Splint: prosthesis through [employer] with [name] not delivered yet Restrictions Weight Bearing Restrictions: Yes RLE Weight Bearing: Non weight bearing LLE Weight Bearing: Non weight bearing   Prior Activity Level Household: pt home bound at w/c level since d/c 11/2013 from [place] / [place] Devices/Equipment: Wheelchair;Shower chair without back;Raised  toilet seat with rails Home Equipment: None  Prior Functional Level Prior Function Level of Independence: Needs assistance Gait / Transfers Assistance Needed: w/c level only at supervision to min assist since toe amputations 12/2013 ADL's / Homemaking Assistance Needed: wife assists with all adls at supervision to min assist level Comments: wife states pt at w/c level supervision to min assist level since 11/2013. Unable to get w/c into bathroom or tub bench into bathroom. Wife uses w/c to door of bathroom, pt stand pivot transfer to [facility] into bathroom and she assisted with bathing while he sat on BSC  Current Functional Level Cognition  Overall Cognitive Status:  (decreased problem solving) Difficult to assess due to: Level of arousal Orientation Level: Oriented X4    Extremity Assessment (includes Sensation/Coordination)          ADLs  Overall ADL's : Needs assistance/impaired Eating/Feeding: Independent;Bed level Grooming: Set up;Bed level;Supervision/safety Upper Body Bathing: Set up;Supervision/ safety;Bed level Lower Body Bathing: Maximal assistance;Bed level Upper Body Dressing : Maximal assistance;Bed level Lower Body Dressing: Total assistance;Bed level Toilet Transfer: +2 for physical assistance;Maximal assistance;Transfer board Toilet Transfer Details (indicate cue type and reason): Pt will difficulty maintaining weight foreward and to the left to help with this process more Toileting- Clothing Manipulation and Hygiene: Total assistance;Sitting/lateral lean Functional mobility during ADLs: +2 for physical assistance;Maximal assistance    Mobility  Overal bed mobility: Needs Assistance Bed Mobility: Supine to Sit Supine to sit: Mod assist;HOB elevated General bed mobility comments: Pt needed mod-A to finish powering up to sitting and requires sustained mod-A support to prevent fall backward.    Transfers  Overall transfer level: Needs assistance Equipment used:  None Transfers: Development worker, community transfers: Max assist;+2 physical assistance  Lateral/Scoot Transfers: +2 safety/equipment;Max assist General transfer comment: Pt was able to help slide laterally with bil. UEs, but needed max-A to slide him laterally over EOB to chair using bed pad.    Ambulation / Gait / Stairs / Office manager / Balance Dynamic Sitting Balance Sitting balance - Comments: Pt has a difficult time maintaining forward sitting balance    Special needs/care consideration Skin RLE surgical dressing and site Bowel mgmt: constipated per pt and wife. Requets colace and Senokot as he takes this at home Bladder mgmt: urinal Diabetic mgmt yes   Previous Home Environment Living Arrangements: Spouse/significant other;Children;Other (Comment) (a daughter and her children  also live with pt and his wife)  Lives With: Spouse;Family;Daughter Available Help at Discharge: Family;Available 24 hours/day;Other (Comment) (wife works 12 till [employer] as a Actuary. Brother and son coming t) Type of Home: House Home Layout: One level Alternate Level Stairs-Rails: Right Alternate Level Stairs-Number of Steps: one flight Home Access: Stairs to enter Entrance Stairs-Rails: Right Entrance Stairs-Number of Steps: 2; wife has bought a ramp from [facility] for ramp over these two steps Bathroom Shower/Tub: Tub/shower unit;Curtain Bathroom Toilet: Handicapped height Bathroom Accessibility: No (w/c unable to get into bathroom and tub bench will not fit i) [place]: Yes Type of Home Care Services: Home RN;Homehealth aide Additional Comments: [facility] in the home since d/c from [facility] 11/2013  Discharge Living Setting Plans for Discharge Living Setting: Patient's home;Lives with (comment);Other (Comment) (wife, daughter, and her children) Type of Home at Discharge: House Discharge Home Layout: Two level;Laundry or work area in basement;Able to  live on main level with bedroom/bathroom Alternate Level Stairs-Rails: Right Alternate Level Stairs-Number of Steps: 1 flight Discharge Home Access: Stairs to enter;Ramped entrance;Other (comment) Entrance Stairs-Rails: Right Entrance Stairs-Number of Steps: 2 steps but wife has a small ramp from [facility] Discharge Bathroom Shower/Tub: Tub/shower unit;Curtain;Other (comment) Discharge Bathroom Toilet: Handicapped height Discharge Bathroom Accessibility: No (w/c not able to get into bathroom and tub bench not used in ) Does the patient have any problems obtaining your medications?: No  Social/Family/Support Systems Patient Roles: Spouse;Parent Contact Information: Calixto Pavel, wife Anticipated Caregiver: wife, daughter, son, daughter, and pt's brother Anticipated Caregiver's Contact Information: see above Ability/Limitations of Caregiver: wife works 42- 4 pm as a Actuary. She states brother and son helping when she and daughter work. Wife states she may have to quit her job to take care of pt 24/7. Wife has been providing min assist level Caregiver Availability: 24/7 Discharge Plan Discussed with Primary Caregiver: Yes Is Caregiver In Agreement with Plan?: Yes Does Caregiver/Family have Issues with Lodging/Transportation while Pt is in Rehab?: No  Goals/Additional Needs Patient/Family Goal for Rehab: Mod I to supervision  at w/c level for PT and OT Expected length of stay: ELOS 7 to 10 days Pt/Family Agrees to Admission and willing to participate: Yes Program Orientation Provided & Reviewed with Pt/Caregiver Including Roles  & Responsibilities: Yes  Decrease burden of Care through IP rehab admission: n/a  Possible need for SNF placement upon discharge:not expected  Patient Condition: This patient's medical and functional status has changed since the consult dated: 01/31/2014 in which the Rehabilitation Physician determined and documented that the patient's condition is appropriate for  intensive rehabilitative care in an inpatient rehabilitation facility. See "History of Present Illness" (above) for medical update. Functional changes are: overall max assist transfers with BLE amputations. Patient's medical and functional status update has been discussed with the Rehabilitation physician and patient remains appropriate for inpatient rehabilitation. Will admit to inpatient rehab today.  Preadmission Screen Completed By:  Cleatrice Burke, 02/02/2014 1:19 PM ______________________________________________________________________   Discussed status with Dr. Letta Pate on 02/02/2014 at 1441 and received telephone approval for admission today.  Admission Coordinator:  Cleatrice Burke, time [id number] Date 02/02/2014

## 2014-02-02 NOTE — Discharge Instructions (Signed)
1. Keep knee out straight to prevent flexion contracture 2. Do not put weight on the right amputation stump

## 2014-02-02 NOTE — Progress Notes (Signed)
I have received insurance approval to admit [name] [name] to inpt rehab today. I contacted Dr. Erlinda Hong and he is in agreement. [name] [name] and wife in agreement and I will arrange for today. [phone]

## 2014-02-02 NOTE — Progress Notes (Signed)
   Subjective:  Patient reports pain as mild.  No events  Objective:   VITALS:   Filed Vitals:   02/01/14 0601 02/01/14 1300 02/01/14 2010 02/02/14 0600  BP: 100/58 115/63 103/65 109/66  Pulse: 94 92 99 97  Temp: 98.5 F (36.9 C) 98.5 F (36.9 C) 98.8 F (37.1 C) 99.4 F (37.4 C)  TempSrc: Oral  Oral   Resp: 16 16 16 16   Height:      Weight:      SpO2: 97% 99% 98% 98%    Neurologically intact ABD soft Neurovascular intact Incision: dressing C/D/I and no drainage No cellulitis present Compartment soft   Lab Results  Component Value Date   WBC 9.0 01/31/2014   HGB 8.6* 01/31/2014   HCT 26.8* 01/31/2014   MCV 72.0* 01/31/2014   PLT 316 01/31/2014     Assessment/Plan:  3 Days Post-Op   - Up with PT - recommend CIR - DVT ppx - SCDs, ambulation, asa, plavix - NWB right lower extremity - Pain well controlled - CIR pending insurance  Problem List Items Addressed This Visit     Musculoskeletal and Integument   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

## 2014-02-02 NOTE — Evaluation (Signed)
Occupational Therapy Evaluation Patient Details Name: [name] [name] MRN: [id number] DOB: 12-21-1945 Today's Date: 02/02/2014    History of Present Illness 68 y.o. Male s/p R BKA, hx of L BKA, CHF and dementia   Clinical Impression   This 68 yo male admitted and underwent above presents to acute OT with decreased sitting balance, decreased overall strength, decreased safety awareness, decreased cognition all affecting pt's ability to care for himself and putting increased burden on caregiver. Pt will benefit from inpatient rehab to get to a min A or better for BADLs. Acute OT will continue to follow.    Follow Up Recommendations  CIR;Supervision/Assistance - 24 hour    Equipment Recommendations   (TBD at next venue)       Precautions / Restrictions Precautions Precautions: Fall Restrictions Weight Bearing Restrictions: Yes RLE Weight Bearing: Non weight bearing LLE Weight Bearing: Non weight bearing      Mobility Bed Mobility Overal bed mobility: Needs Assistance Bed Mobility: Supine to Sit     Supine to sit: Mod assist;HOB elevated        Transfers Overall transfer level: Needs assistance   Transfers: Lateral/Scoot Transfers          Lateral/Scoot Transfers: +2 safety/equipment;Max assist      Balance Overall balance assessment: Needs assistance Sitting-balance support: Feet unsupported;Bilateral upper extremity supported Sitting balance-Leahy Scale: Poor Sitting balance - Comments: Pt has a difficult time maintaining forward sitting balance Postural control: Posterior lean                                  ADL Overall ADL's : Needs assistance/impaired Eating/Feeding: Independent;Bed level   Grooming: Set up;Bed level;Supervision/safety   Upper Body Bathing: Set up;Supervision/ safety;Bed level   Lower Body Bathing: Maximal assistance;Bed level   Upper Body Dressing : Maximal assistance;Bed level   Lower Body Dressing: Total  assistance;Bed level   Toilet Transfer: +2 for physical assistance;Maximal assistance;Transfer board Toilet Transfer Details (indicate cue type and reason): Pt will difficulty maintaining weight foreward and to the left to help with this process more Toileting- Clothing Manipulation and Hygiene: Total assistance;Sitting/lateral lean       Functional mobility during ADLs: +2 for physical assistance;Maximal assistance                 Pertinent Vitals/Pain No c/o pain     Hand Dominance Right   Extremity/Trunk Assessment Upper Extremity Assessment Upper Extremity Assessment: Generalized weakness           Communication Communication Communication: No difficulties   Cognition Arousal/Alertness: Awake/alert Behavior During Therapy: WFL for tasks assessed/performed Overall Cognitive Status:  (decreased problem solving)                                Home Living Family/patient expects to be discharged to:: Private residence Living Arrangements: Spouse/significant other Available Help at Discharge: Family;Available 24 hours/day Type of Home: House Home Access: Stairs to enter [employer] of Steps: 2   Home Layout: One level Alternate Level Stairs-Number of Steps: one flight Alternate Level Stairs-Rails: Right Bathroom Shower/Tub: Tub/shower unit;Curtain   Bathroom Toilet: Handicapped height Bathroom Accessibility: No   Home Equipment: None          Prior Functioning/Environment Level of Independence: Needs assistance             OT Diagnosis: Generalized weakness;Cognitive deficits  OT Problem List: Decreased strength;Impaired balance (sitting and/or standing);Decreased cognition;Decreased knowledge of use of DME or AE;Decreased safety awareness   OT Treatment/Interventions: Self-care/ADL training;DME and/or AE instruction;Therapeutic activities;Cognitive remediation/compensation;Balance training;Patient/family education    OT  Goals(Current goals can be found in the care plan section) Acute Rehab OT Goals Patient Stated Goal: To rehab then home OT Goal Formulation: With patient Time For Goal Achievement: 02/16/14 Potential to Achieve Goals: Good  OT Frequency: Min 2X/week           End of Session Nurse Communication: Mobility status  Activity Tolerance: Patient tolerated treatment well Patient left: in chair;with call bell/phone within reach   Time: 8270-7867 OT Time Calculation (min): 17 min Charges:  OT General Charges $OT Visit: 1 Procedure OT Evaluation $Initial OT Evaluation Tier I: 1 Procedure OT Treatments $Self Care/Home Management : 8-22 mins  Almon Register [phone] 02/02/2014, 10:26 AM

## 2014-02-02 NOTE — Progress Notes (Signed)
PMR Admission Coordinator Pre-Admission Assessment  [name] is an 68 y.o., male  MRN: [id number]  DOB: 03-04-1946  Height: $Remove'5\' 9"'mXzCwwS$  (175.3 cm)  Weight: 78.472 kg (173 lb)  Insurance Information  HMO: yes PPO: PCP: IPA: 80/20: OTHER: medicare advantage plan  PRIMARY: A[employer] Medicare Policy#: [id number] Subscriber: pt  CM Name: Santiago Glad Phone#: [phone] ext [phone] Fax#: [phone]  Pre-Cert#: [id number] Employer: retired to be followed by onsite reviewer, [name] at [phone]  Benefits: Phone #: [phone] Name: 02/01/14  Eff. 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 right-handed male with history of nonischemic cardiomyopathy with ICD, hypertension, chronic systolic congestive heart failure, PAF, diabetes mellitus with peripheral neuropathy and left BKA secondary to osteomyelitis received inpatient rehabilitation services in March of 2015 and currently awaiting prosthesis.  Patient lives with his wife primarily using a wheelchair prior to admission and a walker for stand pivot transfers. Admitted 01/30/2014 with nonhealing right foot ulcers and findings of osteomyelitis. No relief with conservative care. Underwent right transtibial amputation 01/30/2014 per Dr.Xu.  Postoperative pain management. Acute blood loss anemia 8.6 and monitored. Aspirin and Plavix resumed postoperatively for history of CAD.  Past Medical History  Past Medical History   Diagnosis  Date   .  GERD (gastroesophageal reflux disease)    .  Obesity    .  Cardiomyopathy, nonischemic      a. Cath 2003: mild nonobstructive CAD, EF 25% at that time.   .  Hypertension    .  Chronic systolic CHF (congestive heart failure)      a. NICM EF 25% dating back to at least 2003.   [name]  PAF (paroxysmal atrial fibrillation)      a. Noted on ICD interrogation 2012; b. coumadin d/c'd 01/2013.   [name]  NSVT (nonsustained ventricular tachycardia)      a. Noted on ICD interrogation in 2011.   [name]  LBBB (left bundle branch block)    .  Critical lower limb ischemia    .  High cholesterol    .  PAD (peripheral artery disease)      a. 08/2013 Periph Angio/PTA: Abd Ao nl, RLE- 3v runoff, PT diff dzs, AT 90p, LLE 2v runoff, PT 100, AT 14m (diamondback ORA/chocolate balloon PTA).   .  Type II diabetes mellitus    .  Uncontrolled pain, Lt toe  09/21/2013   .  Gangrenous toe, Lt toe  09/21/2013   .  CAD (coronary artery disease)      a. Nonobstructive by cath 09/2001.   [name]  Automatic implantable cardioverter-defibrillator in situ      a. 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   .  Dementia    .  Arthritis    .  History of blood transfusion    .  Complication of anesthesia    .  PONV (postoperative nausea and vomiting)     Family History  family history includes Diabetes in his father and mother; Heart disease in his mother; Hypertension in his mother.  Prior Rehab/Hospitalizations: CIR 10/2013 and d/c home at supervision level; insurance denied readmission 12/2013 after RLE toe s amputated. Pt [facility] with [facility].  Has been to [facility] and [facility] SNF previously  Current Medications  Current facility-administered medications:0.9 % sodium chloride infusion, , Intravenous, Continuous,  Naiping Eduard Roux, MD; aspirin EC tablet 325 mg, 325 mg, Oral, Daily, Naiping Eduard Roux, MD, 325 mg at 02/02/14 1610; atorvastatin (LIPITOR) tablet 10 mg, 10 mg, Oral, Daily, Naiping Eduard Roux, MD, 10 mg at 02/02/14 0958; carvedilol (COREG) tablet 12.5 mg, 12.5 mg, Oral, BID WC, Naiping Eduard Roux, MD, 12.5 mg at 02/02/14 0810  clopidogrel (PLAVIX) tablet 75 mg, 75 mg, Oral, Q breakfast, Naiping Eduard Roux, MD, 75 mg at 02/02/14 0810; diphenhydrAMINE (BENADRYL) 12.5 MG/5ML elixir 25 mg, 25 mg, Oral, Q4H PRN, Marianna Payment, MD; furosemide (LASIX) tablet 40 mg, 40 mg, Oral, BID, Naiping Eduard Roux, MD, 40 mg at 02/02/14 0810  HYDROcodone-acetaminophen (NORCO/VICODIN) 5-325 MG per tablet 1-2 tablet, 1-2 tablet, Oral, Q4H PRN, Marianna Payment, MD, 2 tablet at 01/31/14 1037; insulin aspart (novoLOG) injection 0-15 Units, 0-15 Units, Subcutaneous, TID WC, Naiping Eduard Roux, MD, 3 Units at 02/02/14 1220; insulin aspart (novoLOG) injection 0-5 Units, 0-5 Units, Subcutaneous, QHS, Naiping Eduard Roux, MD, 2 Units at 02/01/14 2130  insulin detemir (LEVEMIR) injection 35 Units, 35 Units, Subcutaneous, QHS, Naiping Eduard Roux, MD, 35 Units at 02/01/14 2130; iron polysaccharides (NIFEREX) capsule 150 mg, 150 mg, Oral, Daily, Naiping Eduard Roux, MD, 150 mg at 02/02/14 9604; lactated ringers infusion, , Intravenous, Continuous, Fulton Reek, MD, Last Rate: 10 mL/hr at 01/30/14 1248  methocarbamol (ROBAXIN) 500 mg in dextrose 5 % 50 mL IVPB, 500 mg, Intravenous, Q6H PRN, Naiping Eduard Roux, MD; methocarbamol (ROBAXIN) tablet 500 mg, 500 mg, Oral, Q6H PRN, Marianna Payment, MD, 500 mg at 02/02/14 0813; metoCLOPramide (REGLAN) injection 5-10 mg, 5-10 mg, Intravenous, Q8H PRN, Naiping Eduard Roux, MD, 10 mg at 01/30/14 1856  metoCLOPramide (REGLAN) tablet 5-10 mg, 5-10 mg, Oral, Q8H PRN, Marianna Payment, MD, 10 mg at 01/31/14 1155; morphine 2 MG/ML injection 1 mg, 1 mg, Intravenous, Q2H PRN, Naiping Eduard Roux, MD, 1 mg at 01/31/14 0048; multivitamin with minerals tablet 1 tablet, 1 tablet, Oral, Daily, Naiping Eduard Roux, MD, 1 tablet at 02/02/14 0958; ondansetron (ZOFRAN) injection 4 mg, 4 mg, Intravenous, Q6H PRN, Marianna Payment, MD, 4 mg at 01/31/14 0830  ondansetron (ZOFRAN) tablet 4 mg, 4 mg, Oral, Q6H PRN, Naiping Eduard Roux, MD; oxyCODONE (Oxy IR/ROXICODONE) immediate release tablet 5-15 mg, 5-15 mg, Oral, Q3H PRN, Marianna Payment, MD, 15 mg at 02/02/14 0813; pantoprazole (PROTONIX) EC tablet 80 mg, 80 mg, Oral, Q1200, Naiping Eduard Roux, MD, 80 mg at 02/02/14 1220; polyethylene glycol (MIRALAX / GLYCOLAX) packet 17 g, 17 g, Oral, Daily PRN, Naiping Eduard Roux, MD  potassium chloride SA (K-DUR,KLOR-CON) CR tablet 20 mEq, 20 mEq, Oral, BID, Naiping Eduard Roux, MD, 20 mEq at 02/02/14 0958; pregabalin (LYRICA) capsule 75 mg, 75 mg, Oral, BID, Naiping Eduard Roux, MD, 75 mg at 02/02/14 0959; senna-docusate (Senokot-S) tablet 1 tablet, 1 tablet, Oral, BID, Naiping Eduard Roux, MD, 1 tablet at 02/02/14  0958; sorbitol 70 % solution 30 mL, 30 mL, Oral, Daily PRN, Naiping Eduard Roux, MD  spironolactone (ALDACTONE) tablet 12.5 mg, 12.5 mg, Oral, QHS, Naiping Eduard Roux, MD, 12.5 mg at 02/01/14 2129; vitamin E capsule 1,000 Units, 1,000 Units, Oral, Daily, Naiping Eduard Roux, MD, 1,000 Units at 02/02/14 3785  Patients Current Diet: Carb Control  Precautions / Restrictions  Precautions  Precautions: Fall  Precaution Comments: Discussed precautions with pt.  Other Brace/Splint: prosthesis through [employer] with [name] not delivered yet  Restrictions  Weight Bearing Restrictions: Yes  RLE Weight Bearing: Non weight bearing  LLE Weight Bearing: Non weight bearing  Prior Activity Level  Household: pt home bound at w/c level since d/c 11/2013 from [place] [facility] [place] Devices/Equipment: Wheelchair;Shower chair without back;Raised toilet seat with rails  Home Equipment: None   Prior Functional Level  Prior Function  Level of Independence: Needs assistance  Gait / Transfers Assistance Needed: w/c level only at supervision to min assist since toe amputations 12/2013  ADL's / Homemaking Assistance Needed: wife assists with all adls at supervision to min assist level  Comments: wife states pt at w/c level supervision to min assist level since 11/2013. Unable to get w/c into bathroom or tub bench into bathroom. Wife uses w/c to door of bathroom, pt stand pivot transfer to [facility] into bathroom and she assisted with bathing while he sat on BSC  Current Functional Level  Cognition  Overall Cognitive Status: (decreased problem solving)  Difficult to assess due to: Level of arousal  Orientation Level: Oriented X4   Extremity Assessment  (includes Sensation/Coordination)     ADLs  Overall ADL's : Needs assistance/impaired  Eating/Feeding: Independent;Bed level  Grooming: Set up;Bed level;Supervision/safety  Upper Body Bathing: Set up;Supervision/ safety;Bed level  Lower Body Bathing: Maximal assistance;Bed level  Upper Body Dressing : Maximal assistance;Bed level  Lower Body Dressing: Total assistance;Bed level  Toilet Transfer: +2 for physical assistance;Maximal assistance;Transfer board  Toilet Transfer Details (indicate cue type and reason): Pt will difficulty maintaining weight foreward and to the left to help with this process more  Toileting- Clothing Manipulation and Hygiene: Total assistance;Sitting/lateral lean  Functional mobility during ADLs: +2 for physical assistance;Maximal assistance   Mobility  Overal bed mobility: Needs Assistance  Bed Mobility: Supine to Sit  Supine to sit: Mod assist;HOB elevated  General bed mobility comments: Pt needed mod-A to finish powering up to sitting and requires sustained mod-A support to prevent fall backward.   Transfers  Overall transfer level: Needs assistance  Equipment used: None  Transfers: Camera operator transfers: Max assist;+2 physical assistance  Lateral/Scoot Transfers: +2 safety/equipment;Max assist  General transfer comment: Pt was able to help slide laterally with bil. UEs, but needed max-A to slide him laterally over EOB to chair using bed pad.   Ambulation / Gait / Stairs / Education administrator / Balance  Dynamic Sitting Balance  Sitting balance - Comments: Pt has a difficult time maintaining forward sitting balance   Special needs/care consideration  Skin RLE surgical dressing and site  Bowel mgmt: constipated per pt and wife. Requets colace and Senokot as he takes this at home  Bladder mgmt: urinal  Diabetic mgmt yes   Previous Home Environment  Living Arrangements: Spouse/significant other;Children;Other (Comment) (a daughter and her children also live with pt and his wife)  Lives With: Spouse;Family;Daughter  Available Help at Discharge: Family;Available 24 hours/day;Other (Comment) (wife works 12 till [employer]  as a Actuary. Brother and son coming t)  Type of Home: House  Home Layout: One level  Alternate Level Stairs-Rails: Right  Alternate Level Stairs-Number of Steps: one flight  Home Access: Stairs to enter  Entrance Stairs-Rails: Right  Entrance Stairs-Number of Steps: 2; wife has bought a ramp from [facility] for ramp over these two steps  Bathroom Shower/Tub: Tub/shower unit;Curtain  Bathroom Toilet: Handicapped height  Bathroom Accessibility: No (w/c unable to get into bathroom and tub bench will not fit i)  [place]: Yes  Type of Home Care Services: Home RN;Homehealth aide  Additional Comments: [facility] in the home since d/c from [facility] 11/2013  Discharge Living Setting  Plans for Discharge Living Setting: Patient's home;Lives with (comment);Other (Comment) (wife, daughter, and her children)  Type of Home at Discharge: House  Discharge Home Layout: Two level;Laundry or work area in basement;Able to live on main level with  bedroom/bathroom  Alternate Level Stairs-Rails: Right  Alternate Level Stairs-Number of Steps: 1 flight  Discharge Home Access: Stairs to enter;Ramped entrance;Other (comment)  Entrance Stairs-Rails: Right  Entrance Stairs-Number of Steps: 2 steps but wife has a small ramp from [facility]  Discharge Bathroom Shower/Tub: Tub/shower unit;Curtain;Other (comment)  Discharge Bathroom Toilet: Handicapped height  Discharge Bathroom Accessibility: No (w/c not able to get into bathroom and tub bench not used in )  Does the patient have any problems obtaining your medications?: No  Social/Family/Support Systems  Patient Roles: Spouse;Parent  Contact Information: Jeovanny Cuadros, wife  Anticipated Caregiver: wife, daughter, son, daughter, and pt's brother  Anticipated Caregiver's Contact Information: see above  Ability/Limitations of Caregiver: wife works 52- 4 pm as a Actuary. She states brother and son helping when she and daughter work. Wife states she may have to quit her job to take care of pt 24/7. Wife has been providing min assist level  Caregiver Availability: 24/7  Discharge Plan Discussed with Primary Caregiver: Yes  Is Caregiver In Agreement with Plan?: Yes  Does Caregiver/Family have Issues with Lodging/Transportation while Pt is in Rehab?: No  Goals/Additional Needs  Patient/Family Goal for Rehab: Mod I to supervision at w/c level for PT and OT  Expected length of stay: ELOS 7 to 10 days  Pt/Family Agrees to Admission and willing to participate: Yes  Program Orientation Provided & Reviewed with Pt/Caregiver Including Roles & Responsibilities: Yes  Decrease burden of Care through IP rehab admission: n/a  Possible need for SNF placement upon discharge:not expected  Patient Condition: This patient's medical and functional status has changed since the consult dated: 01/31/2014 in which the Rehabilitation Physician determined and documented that the patient's condition is appropriate for intensive  rehabilitative care in an inpatient rehabilitation facility. See "History of Present Illness" (above) for medical update. Functional changes are: overall max assist transfers with BLE amputations. Patient's medical and functional status update has been discussed with the Rehabilitation physician and patient remains appropriate for inpatient rehabilitation. Will admit to inpatient rehab today.  Preadmission Screen Completed By: Cleatrice Burke, 02/02/2014 1:19 PM  ______________________________________________________________________  Discussed status with Dr. Letta Pate on 02/02/2014 at 1441 and received telephone approval for admission today.  Admission Coordinator: Cleatrice Burke, time 1660 Date 02/02/2014  Cosigned by: [name], MD [02/02/2014 2:51 PM]

## 2014-02-03 ENCOUNTER — Inpatient Hospital Stay (HOSPITAL_COMMUNITY): Payer: Medicare Other | Admitting: *Deleted

## 2014-02-03 ENCOUNTER — Inpatient Hospital Stay (HOSPITAL_COMMUNITY): Payer: Medicare Other

## 2014-02-03 ENCOUNTER — Encounter (HOSPITAL_COMMUNITY): Payer: Self-pay | Admitting: *Deleted

## 2014-02-03 ENCOUNTER — Inpatient Hospital Stay (HOSPITAL_COMMUNITY): Payer: Medicare Other | Admitting: Occupational Therapy

## 2014-02-03 LAB — CBC WITH DIFFERENTIAL/PLATELET
Basophils Absolute: 0 10*3/uL (ref 0.0–0.1)
Basophils Relative: 0 % (ref 0–1)
EOS ABS: 0.2 10*3/uL (ref 0.0–0.7)
Eosinophils Relative: 3 % (ref 0–5)
HCT: 27.1 % — ABNORMAL LOW (ref 39.0–52.0)
Hemoglobin: 8.8 g/dL — ABNORMAL LOW (ref 13.0–17.0)
LYMPHS ABS: 0.7 10*3/uL (ref 0.7–4.0)
Lymphocytes Relative: 10 % — ABNORMAL LOW (ref 12–46)
MCH: 23.2 pg — AB (ref 26.0–34.0)
MCHC: 32.5 g/dL (ref 30.0–36.0)
MCV: 71.5 fL — AB (ref 78.0–100.0)
MONOS PCT: 9 % (ref 3–12)
Monocytes Absolute: 0.7 10*3/uL (ref 0.1–1.0)
Neutro Abs: 5.6 10*3/uL (ref 1.7–7.7)
Neutrophils Relative %: 78 % — ABNORMAL HIGH (ref 43–77)
Platelets: 308 10*3/uL (ref 150–400)
RBC: 3.79 MIL/uL — AB (ref 4.22–5.81)
RDW: 17.8 % — ABNORMAL HIGH (ref 11.5–15.5)
WBC: 7.1 10*3/uL (ref 4.0–10.5)

## 2014-02-03 LAB — COMPREHENSIVE METABOLIC PANEL
ALK PHOS: 59 U/L (ref 39–117)
ALT: 6 U/L (ref 0–53)
AST: 13 U/L (ref 0–37)
Albumin: 2.5 g/dL — ABNORMAL LOW (ref 3.5–5.2)
BUN: 15 mg/dL (ref 6–23)
CO2: 27 meq/L (ref 19–32)
Calcium: 9 mg/dL (ref 8.4–10.5)
Chloride: 101 mEq/L (ref 96–112)
Creatinine, Ser: 0.93 mg/dL (ref 0.50–1.35)
GFR, EST NON AFRICAN AMERICAN: 83 mL/min — AB (ref >90)
GLUCOSE: 184 mg/dL — AB (ref 70–99)
POTASSIUM: 4.2 meq/L (ref 3.7–5.3)
Sodium: 142 mEq/L (ref 137–147)
Total Protein: 7.3 g/dL (ref 6.0–8.3)

## 2014-02-03 LAB — GLUCOSE, CAPILLARY
Glucose-Capillary: 153 mg/dL — ABNORMAL HIGH (ref 70–99)
Glucose-Capillary: 186 mg/dL — ABNORMAL HIGH (ref 70–99)
Glucose-Capillary: 174 mg/dL — ABNORMAL HIGH (ref 70–99)
Glucose-Capillary: 261 mg/dL — ABNORMAL HIGH (ref 70–99)

## 2014-02-03 NOTE — Progress Notes (Signed)
Occupational Therapy Session Note  Patient Details  Name: [name] [name] MRN: [id number] Date of Birth: 1945-10-03  Today's Date: 02/03/2014 Time:  -   1430-1530   (60 min)    Short Term Goals: Week 1:  OT Short Term Goal 1 (Week 1): Pt will transfer to drop arm BSC with max A (anterior/ posterior transfer) OT Short Term Goal 2 (Week 1): Pt will don LB clothing (pants) with mod A with lateral leans  OT Short Term Goal 3 (Week 1): Pt will perform 1/3 toileting steps on toilet with lateral leans OT Short Term Goal 4 (Week 1): Pt will perform bed mobility in prep for ADL tasks with mod A  OT Short Term Goal 5 (Week 1): Pt will maintain static sitting wEOB/EOM with min A in prep for more dynmaic sitting balance during functioanl tasks     Skilled Therapeutic Interventions/Progress Updates:    Engaged in therapeutic  Group exercises using 5 # bar weight and then free weights (2 #).  Pt needed minimal assist for correct movement and  Form.  Provided leg rest for Both knees to rest on.  Pt. Taken back to room at end of session.  Therapy Documentation Precautions:  Precautions Precautions: Fall Other Brace/Splint: prosthesis through [employer] with [name] not delivered yet Restrictions Weight Bearing Restrictions: Yes RLE Weight Bearing: Non weight bearing LLE Weight Bearing: Non weight bearing    Pain:  none:    See FIM for current functional status  Therapy/Group: Individual Therapy  Lisa Roca 02/03/2014, 3:28 PM

## 2014-02-03 NOTE — Progress Notes (Signed)
68 y.o. right-handed male with history of nonischemic cardiomyopathy with ICD, hypertension, chronic systolic congestive heart failure, PAF, diabetes mellitus with peripheral neuropathy and left BKA secondary to osteomyelitis received inpatient rehabilitation services in March of 2015 and currently awaiting prosthesis. Patient lives with his wife primarily using a wheelchair prior to admission and a walker for stand pivot transfers. Admitted 01/30/2014 with nonhealing right foot ulcers and findings of osteomyelitis. No relief with conservative care. Underwent right transtibial amputation 01/30/2014 per Dr.Xu  Subjective/Complaints: No problems overnite, slept ok, no significant stump pain, no phantom pain Review of Systems - Negative except mild R stump pain Objective: Vital Signs: Blood pressure 103/68, pulse 91, temperature 98.9 F (37.2 C), temperature source Oral, resp. rate 16, SpO2 99.00%. No results found. Results for orders placed during the hospital encounter of 02/02/14 (from the past 72 hour(s))  GLUCOSE, CAPILLARY     Status: Abnormal   Collection Time    02/02/14  9:49 PM      Result Value Ref Range   Glucose-Capillary 228 (*) 70 - 99 mg/dL  CBC WITH DIFFERENTIAL     Status: Abnormal   Collection Time    02/03/14  5:37 AM      Result Value Ref Range   WBC 7.1  4.0 - 10.5 K/uL   RBC 3.79 (*) 4.22 - 5.81 MIL/uL   Hemoglobin 8.8 (*) 13.0 - 17.0 g/dL   HCT 27.1 (*) 39.0 - 52.0 %   MCV 71.5 (*) 78.0 - 100.0 fL   MCH 23.2 (*) 26.0 - 34.0 pg   MCHC 32.5  30.0 - 36.0 g/dL   RDW 17.8 (*) 11.5 - 15.5 %   Platelets 308  150 - 400 K/uL   Neutrophils Relative % 78 (*) 43 - 77 %   Neutro Abs 5.6  1.7 - 7.7 K/uL   Lymphocytes Relative 10 (*) 12 - 46 %   Lymphs Abs 0.7  0.7 - 4.0 K/uL   Monocytes Relative 9  3 - 12 %   Monocytes Absolute 0.7  0.1 - 1.0 K/uL   Eosinophils Relative 3  0 - 5 %   Eosinophils Absolute 0.2  0.0 - 0.7 K/uL   Basophils Relative 0  0 - 1 %   Basophils  Absolute 0.0  0.0 - 0.1 K/uL     HEENT: poor dentition Cardio: RRR and no murmur Resp: CTA B/L and unlabored GI: BS positive and NT,ND Extremity:  Pulses positive and No Edema Skin:   Intact and Wound ACE wrap on R , stump shrinker Left Neuro: Alert/Oriented and Abnormal Motor 4/5 B HF, 5/5 B UE Musc/Skel:  Other bilateral BKA Gen NAD   Assessment/Plan: 1. 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 Physiatrist is providing close team supervision and 24 hour management of active medical problems listed below. Physiatrist and rehab team continue to assess barriers to discharge/monitor patient progress toward functional and medical goals. FIM:                   Comprehension Comprehension Mode: Auditory Comprehension: 5-Follows basic conversation/direction: With extra time/assistive device  Expression Expression Mode: Verbal Expression: 5-Expresses basic needs/ideas: With extra time/assistive device  Social Interaction Social Interaction: 6-Interacts appropriately with others with medication or extra time (anti-anxiety, antidepressant).     Memory Memory: 4-Recognizes or recalls 75 - 89% of the time/requires cueing 10 -  24% of the time  Medical Problem List and Plan:  1. Functional deficits secondary to Right BKA 01/30/2014 and History L BKA March 2015 and await prosthesis per [employer]  2. DVT Prophylaxis/Anticoagulation: N/A Bilat BKA  3. Pain Management:lyrica 75 mg BID, Hydrocodone,robaxin as needed  4. Acute Blood Loss anemia.Continue Niferex.Follow up CBC  5. Neuropsych: This patient is capable of making decisions on his own behalf.  6. Diabetes mellitus with peripheral neuropathy.Lattest hemoglobin A1c 8.6.Levemir 35 unit daily. Check CBG Ac/HS.Diabetic teaching  7.NICM/ICD.continue aspirin and plavix.no chest pain or shortness of breath   8.Hypertension.Coreg 12.5 mg daily.Monitor wiyh increased mobility  9.Chronic systolic CHF.Lasix 40 mg BID.Monitor for any signs of fluid overload.  10.Hyperlipidemia 10 mg daily  11.GERD.Protonix   LOS (Days) 1 A FACE TO FACE EVALUATION WAS PERFORMED  KIRSTEINS,ANDREW E 02/03/2014, 6:56 AM

## 2014-02-03 NOTE — Evaluation (Signed)
Occupational Therapy Assessment and Plan  Patient Details  Name: [name] [name] MRN: [id number] Date of Birth: 08/02/1946  OT Diagnosis: acute pain and muscle weakness (generalized) Rehab Potential: Rehab Potential: Good ELOS: 10-12 days   Today's Date: 02/03/2014 Time: 0730-0830 Time Calculation (min): 60 min  Problem List:  Patient Active Problem List   Diagnosis Date Noted  . S/P bilateral BKA (below knee amputation) 02/02/2014  . Osteomyelitis of right foot 01/30/2014  . 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 01/04/2014  . Osteomyelitis of toe of right foot 01/04/2014  . S/P Lt BKA 11/09/13 11/14/2013  . Acute blood loss anemia 11/11/2013  . Type II or unspecified type diabetes mellitus 11/09/2013  . Lower limb amputation, great toe 10/11/2013  . Lower limb amputation, other toe(s) 10/11/2013  . Chronic osteomyelitis of toe of left foot 10/04/2013  . Foot osteomyelitis, left 10/04/2013  . Uncontrolled pain, Lt toe 09/21/2013  . Gangrenous toe, Lt toe 09/21/2013  . PAD (peripheral artery disease) 09/13/2013  . Diabetes mellitus 09/13/2013  . Hyperlipidemia 09/13/2013  . PAF (paroxysmal atrial fibrillation) 09/13/2013  . Critical lower limb ischemia- s/p Rt anterior tibial PTA 12/29/13 in preparation for Rt BKA 08/30/2013  . 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 HTN (hypertension) 04/08/2011  . NICM- EF 20-25% echo 6/14 09/29/2008  . LBBB 09/29/2008  . SYSTOLIC HEART FAILURE, CHRONIC 09/29/2008    Past Medical History:  Past Medical History  Diagnosis Date  . GERD (gastroesophageal reflux disease)   . Obesity   . Cardiomyopathy, nonischemic     a. Cath 2003: mild nonobstructive CAD, EF 25% at that time.  . Hypertension   . Chronic systolic CHF (congestive heart failure)     a. NICM EF 25% dating back to at least 2003.  [name] PAF (paroxysmal atrial fibrillation)     a. Noted on ICD interrogation 2012;  b. coumadin d/c'd 01/2013.  [name] NSVT  (nonsustained ventricular tachycardia)     a. Noted on ICD interrogation in 2011.  [name] LBBB (left bundle branch block)   . Critical lower limb ischemia   . High cholesterol   . PAD (peripheral artery disease)     a. 08/2013 Periph Angio/PTA: Abd Ao nl, RLE- 3v runoff, PT diff dzs, AT 90p, LLE 2v runoff, PT 100, AT 9m (diamondback ORA/chocolate balloon PTA).  . Type II diabetes mellitus   . Uncontrolled pain, Lt toe 09/21/2013  . Gangrenous toe, Lt toe 09/21/2013  . CAD (coronary artery disease)     a. Nonobstructive by cath 09/2001.  [name] Automatic implantable cardioverter-defibrillator in situ     a. 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  . Dementia   . Arthritis   . History of blood transfusion   . Complication of anesthesia   . PONV (postoperative nausea and vomiting)    Past Surgical History:  Past Surgical History  Procedure Laterality Date  . Lithotripsy  2001  . Cervical spine surgery  1994  . Cardiac defibrillator placement  06/2009    WITH GENERATOR REPLACED; BiV ICD  . US echocardiography  03/21/2008    EF 30-35%  . Cardiovascular stress test  03/20/2009    EF 33%  . Transluminal atherectomy tibial artery Left 09/12/2013  . Cardiac catheterization  10/01/2001    THERE WAS GLOBAL HYPOKINESIS AND EF 25%. THERE APPEARED TO BE GLOBAL DECREASE IN WALL MOTION  . Toe amputation  10/04/2013    LEFT  GREAT TOE AND 4TH TOE   /   DR Erlinda Hong  . Amputation Left 10/04/2013    Procedure: LEFT GREAT TOE AND SECOND TOE AMPUTATION;  Surgeon: Marianna Payment, MD;  Location: [place];  Service: Orthopedics;  Laterality: Left;  . Colonoscopy    . Leg amputation below knee Left 11/09/2013    DR Erlinda Hong  . Amputation Left 11/09/2013    Procedure: LEFT AMPUTATION BELOW KNEE;  Surgeon: Marianna Payment, MD;  Location: [place];  Service: Orthopedics;  Laterality: Left;  . Coronary angioplasty    . Amputation Right 01/04/2014    Procedure: Doristine Devoid second and third toe amputation;   Surgeon: Marianna Payment, MD;  Location: [place];  Service: Orthopedics;  Laterality: Right;  . Amputation Right 01/30/2014    Procedure: RIGHT BELOW KNEE AMPUTATION;  Surgeon: Marianna Payment, MD;  Location: [place];  Service: Orthopedics;  Laterality: Right;    Assessment & Plan Clinical Impression: Patient is a 68 y.o. year old right-handed male with history of nonischemic cardiomyopathy with ICD, hypertension, chronic systolic congestive heart failure, PAF, diabetes mellitus with peripheral neuropathy and left BKA secondary to osteomyelitis received inpatient rehabilitation services in March of 2015 and currently awaiting prosthesis. Patient lives with his wife primarily using a wheelchair prior to admission and a walker for stand pivot transfers. Admitted 01/30/2014 with nonhealing right foot ulcers and findings of osteomyelitis. No relief with conservative care. Underwent right transtibial amputation 01/30/2014 per Dr.Xu. Postoperative pain management. Acute blood loss anemia 8.6 and monitored. Aspirin and Plavix resumed postoperatively for history of CAD   Patient transferred to [facility] on 02/02/2014 .    Patient currently requires mod to total A  with basic self-care skills and basic mobility secondary to muscle weakness and acute pain, decreased cardiorespiratoy endurance and decreased sitting balance, decreased postural control, decreased balance strategies and decr activity tolerance.  Prior to hospitalization, patient could complete ADL with supervision to mod I .  Patient will benefit from skilled intervention to decrease level of assist with basic self-care skills and increase independence with basic self-care skills prior to discharge home with care partner.  Anticipate patient will require intermittent supervision and minimal physical assistance and follow up home health.  OT - End of Session Activity Tolerance: Tolerates 10 - 20 min activity with multiple rests Endurance Deficit: Yes OT  Assessment Rehab Potential: Good OT Patient demonstrates impairments in the following area(s): Balance;Endurance;Motor;Pain;Perception;Safety;Skin Integrity OT Basic ADL's Functional Problem(s): Grooming;Bathing;Dressing;Toileting OT Transfers Functional Problem(s): Toilet;Tub/Shower OT Additional Impairment(s): None OT Plan OT Intensity: Minimum of 1-2 x/day, 45 to 90 minutes OT Frequency: 5 out of 7 days OT Duration/Estimated Length of Stay: 10-12 days OT Treatment/Interventions: Medical illustrator training;Community reintegration;Discharge planning;Pain management;Skin care/wound managment;UE/LE Coordination activities;UE/LE Strength taining/ROM;Self Care/advanced ADL retraining;Therapeutic Exercise;Psychosocial support;Functional mobility training;Patient/family education;Therapeutic Activities;Wheelchair propulsion/positioning;DME/adaptive equipment instruction OT Self Feeding Anticipated Outcome(s): no goal set OT Basic Self-Care Anticipated Outcome(s): supervision OT Toileting Anticipated Outcome(s): supervision  OT Bathroom Transfers Anticipated Outcome(s): supervision OT Recommendation Patient destination: Home Follow Up Recommendations: Home health OT Equipment Recommended: To be determined   Skilled Therapeutic Intervention 1:1 OT eval with OT goals, purpose and role discussed. Self care retraining of bathing and dressing at bed level. Pt performed bathing in bed seated in a supported position Pt rolled side to side for LB clothing management with max A to maintain side lying to pull up pants over hips. Supine to sit with bed rails with max A. Pt required max A to maintain  sitting in an unsupported position. Max A to pivot hips in preparing for posterior transfer into w/c with 2nd person holding w.c to ensure safety. Provided instructional cues reciprocal scooting and for proper hand placement during transfers and transitional movements.  OT Evaluation Precautions/Restrictions   Precautions Precautions: Fall Other Brace/Splint: prosthesis through [employer] with [name] not delivered yet Restrictions RLE Weight Bearing: Non weight bearing LLE Weight Bearing: Non weight bearing General Chart Reviewed: Yes Family/Caregiver Present: No Vital Signs Therapy Vitals Temp: 98.9 F (37.2 C) Temp src: Oral Pulse Rate: 91 Resp: 16 BP: 103/68 mmHg Patient Position (if appropriate): Lying Oxygen Therapy SpO2: 99 % O2 Device: None (Room air) Pain Pain Assessment Pain Score: 5  Pain Location: Leg Pain Orientation: Right Pain Descriptors / Indicators: Aching Pain Onset: On-going Pain Intervention(s): RN made aware Home Living/Prior Functioning Home Living Available Help at Discharge: Family;Available 24 hours/day;Other (Comment) Type of Home: House Home Access: Stairs to enter Entrance Stairs-Number of Steps: 2; wife has bought a ramp from [facility] [employer] for ramp over these two steps Home Layout: One level Additional Comments: Pt was sponge bathing Prior to admit  Lives With: Spouse;Family;Daughter ADL  see FIM Vision/Perception  Vision- History Baseline Vision/History: No visual deficits Patient Visual Report: No change from baseline  Cognition Arousal/Alertness: Awake/alert Orientation Level: Oriented X4 Sensation Sensation Additional Comments: impaired around end of legs Coordination Gross Motor Movements are Fluid and Coordinated: Yes Fine Motor Movements are Fluid and Coordinated: Yes Motor  Motor Motor - Skilled Clinical Observations: generalized weakness Mobility  Bed Mobility Bed Mobility: Rolling Right;Rolling Left;Supine to Sit Rolling Right: 3: Mod assist Rolling Left: 3: Mod assist Supine to Sit: 1: +1 Total assist  Trunk/Postural Assessment  Cervical Assessment Cervical Assessment: Within Functional Limits Thoracic Assessment Thoracic Assessment: Within Functional Limits Lumbar Assessment Lumbar Assessment:  (posterior pelvic  tilt) Postural Control Postural Control: Deficits on evaluation Trunk Control: lean posterior in unsupported sitting requiring max to total A to maintain sitting position  Balance Balance Balance Assessed: Yes Static Sitting Balance Static Sitting - Level of Assistance: 3: Mod assist;2: Max assist Dynamic Sitting Balance Dynamic Sitting - Level of Assistance: 1: +1 Total assist Extremity/Trunk Assessment RUE Assessment RUE Assessment: Within Functional Limits LUE Assessment LUE Assessment: Within Functional Limits  FIM:  FIM - Eating Eating Activity: 6: More than reasonable amount of time FIM - Grooming Grooming Steps: Wash, rinse, dry face;Oral care, brush teeth, clean dentures;Wash, rinse, dry hands;Brush, comb hair;Shave or apply make-up Grooming: 6: More than reasonable amount of time FIM - Bathing Bathing Steps Patient Completed: Chest;Right Arm;Left Arm;Abdomen;Right upper leg;Left upper leg Bathing: 3: Mod-Patient completes 5-7 43f 10 parts or 50-74% FIM - Lower Body Dressing/Undressing Lower body dressing/undressing: 1: Total-Patient completed less than 25% of tasks FIM - Bed/Chair Transfer Bed/Chair Transfer: 2: Supine > Sit: Max A (lifting assist/Pt. 25-49%);1: Bed > Chair or W/C: Total A (helper does all/Pt. < 25%)   Refer to Care Plan for Long Term Goals  Recommendations for other services: None  Discharge Criteria: Patient will be discharged from OT if patient refuses treatment 3 consecutive times without medical reason, if treatment goals not met, if there is a change in medical status, if patient makes no progress towards goals or if patient is discharged from hospital.  The above assessment, treatment plan, treatment alternatives and goals were discussed and mutually agreed upon: by patient  Nicoletta Ba 02/03/2014, 8:17 AM

## 2014-02-03 NOTE — Evaluation (Signed)
Physical Therapy Assessment and Plan  Patient Details  Name: [name] [name] MRN: [id number] Date of Birth: [birth year]  PT Diagnosis: Muscle weakness; phantom pain; bilateral amputation with pre-prosthetic and prosthetic education Rehab Potential: Good ELOS: 10 - 12 days   Today's Date: 02/03/2014 Time: 0850-1000 Time Calculation (min): 70 min  Problem List:  Patient Active Problem List   Diagnosis Date Noted  . S/P bilateral BKA (below knee amputation) 02/02/2014  . Osteomyelitis of right foot 01/30/2014  . 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 01/04/2014  . Osteomyelitis of toe of right foot 01/04/2014  . S/P Lt BKA 11/09/13 11/14/2013  . Acute blood loss anemia 11/11/2013  . Type II or unspecified type diabetes mellitus 11/09/2013  . Lower limb amputation, great toe 10/11/2013  . Lower limb amputation, other toe(s) 10/11/2013  . Chronic osteomyelitis of toe of left foot 10/04/2013  . Foot osteomyelitis, left 10/04/2013  . Uncontrolled pain, Lt toe 09/21/2013  . Gangrenous toe, Lt toe 09/21/2013  . PAD (peripheral artery disease) 09/13/2013  . Diabetes mellitus 09/13/2013  . Hyperlipidemia 09/13/2013  . PAF (paroxysmal atrial fibrillation) 09/13/2013  . Critical lower limb ischemia- s/p Rt anterior tibial PTA 12/29/13 in preparation for Rt BKA 08/30/2013  . 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 HTN (hypertension) 04/08/2011  . NICM- EF 20-25% echo 6/14 09/29/2008  . LBBB 09/29/2008  . SYSTOLIC HEART FAILURE, CHRONIC 09/29/2008    Past Medical History:  Past Medical History  Diagnosis Date  . GERD (gastroesophageal reflux disease)   . Obesity   . Cardiomyopathy, nonischemic     a. Cath 2003: mild nonobstructive CAD, EF 25% at that time.  . Hypertension   . Chronic systolic CHF (congestive heart failure)     a. NICM EF 25% dating back to at least 2003.  [name] PAF (paroxysmal atrial fibrillation)     a. Noted on ICD interrogation 2012;  b. coumadin  d/c'd 01/2013.  [name] NSVT (nonsustained ventricular tachycardia)     a. Noted on ICD interrogation in 2011.  [name] LBBB (left bundle branch block)   . Critical lower limb ischemia   . High cholesterol   . PAD (peripheral artery disease)     a. 08/2013 Periph Angio/PTA: Abd Ao nl, RLE- 3v runoff, PT diff dzs, AT 90p, LLE 2v runoff, PT 100, AT 74m (diamondback ORA/chocolate balloon PTA).  . Type II diabetes mellitus   . Uncontrolled pain, Lt toe 09/21/2013  . Gangrenous toe, Lt toe 09/21/2013  . CAD (coronary artery disease)     a. Nonobstructive by cath 09/2001.  [name] Automatic implantable cardioverter-defibrillator in situ     a. 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  . Dementia   . Arthritis   . History of blood transfusion   . Complication of anesthesia   . PONV (postoperative nausea and vomiting)    Past Surgical History:  Past Surgical History  Procedure Laterality Date  . Lithotripsy  2001  . Cervical spine surgery  1994  . Cardiac defibrillator placement  06/2009    WITH GENERATOR REPLACED; BiV ICD  . US echocardiography  03/21/2008    EF 30-35%  . Cardiovascular stress test  03/20/2009    EF 33%  . Transluminal atherectomy tibial artery Left 09/12/2013  . Cardiac catheterization  10/01/2001    THERE WAS GLOBAL HYPOKINESIS AND EF 25%. THERE APPEARED TO BE GLOBAL DECREASE IN WALL MOTION  . Toe amputation  10/04/2013    LEFT GREAT TOE AND 4TH TOE   /   DR Erlinda Hong  . Amputation Left 10/04/2013    Procedure: LEFT GREAT TOE AND SECOND TOE AMPUTATION;  Surgeon: Marianna Payment, MD;  Location: [place];  Service: Orthopedics;  Laterality: Left;  . Colonoscopy    . Leg amputation below knee Left 11/09/2013    DR Erlinda Hong  . Amputation Left 11/09/2013    Procedure: LEFT AMPUTATION BELOW KNEE;  Surgeon: Marianna Payment, MD;  Location: [place];  Service: Orthopedics;  Laterality: Left;  . Coronary angioplasty    . Amputation Right 01/04/2014    Procedure: Doristine Devoid second and third  toe amputation;  Surgeon: Marianna Payment, MD;  Location: [place];  Service: Orthopedics;  Laterality: Right;  . Amputation Right 01/30/2014    Procedure: RIGHT BELOW KNEE AMPUTATION;  Surgeon: Marianna Payment, MD;  Location: [place];  Service: Orthopedics;  Laterality: Right;    Assessment & Plan Clinical Impression: [name] [name] is a 68 y.o. right-handed male with history of nonischemic cardiomyopathy with ICD, hypertension, chronic systolic congestive heart failure, PAF, diabetes mellitus with peripheral neuropathy and left BKA secondary to osteomyelitis received inpatient rehabilitation services in March of 2015 and currently awaiting prosthesis. Patient lives with his wife primarily using a wheelchair prior to admission and a walker for stand pivot transfers. Admitted 01/30/2014 with nonhealing right foot ulcers and findings of osteomyelitis. No relief with conservative care. Underwent right transtibial amputation 01/30/2014 per Dr.Xu. Postoperative pain management. Acute blood loss anemia 8.6 and monitored. Aspirin and Plavix resumed postoperatively for history of CAD. Physical therapy evaluation completed 01/31/2014 with recommendations of physical medicine rehabilitation consult. Admit for a comprehensive rehab program on 02/02/2014.  Patient currently requires max with mobility secondary to muscle weakness and poor balance.  Prior to hospitalization, patient was supervision to occasional min assist with mobility and lived with Spouse;Daughter in a House home.  Home access is 2; wife has bought a ramp from [employer] for ramp over these two steps.  Patient will benefit from skilled PT intervention to maximize safe functional mobility, minimize fall risk and decrease caregiver burden for planned discharge home with intermittent assist. Plan also to begin prosthetic training with prosthesis for left LE to assist with transfers and balance.  Anticipate patient will benefit from follow up OP at  discharge.  PT - End of Session Activity Tolerance: Tolerates 30+ min activity with multiple rests Endurance Deficit: Yes PT Assessment Rehab Potential: Good PT Patient demonstrates impairments in the following area(s): Balance;Pain PT Transfers Functional Problem(s): Bed Mobility;Bed to Chair;Car PT Locomotion Functional Problem(s): Wheelchair Mobility;Other (comment) (ramp) PT Plan PT Intensity: Minimum of 1-2 x/day ,45 to 90 minutes PT Frequency: 5 out of 7 days PT Duration Estimated Length of Stay: 10 - 12 days PT Treatment/Interventions: Balance/vestibular training;Discharge planning;DME/adaptive equipment instruction;Functional mobility training;Pain management;Patient/family education;Therapeutic Activities;Therapeutic Exercise;UE/LE Strength taining/ROM;Wheelchair propulsion/positioning PT Transfers Anticipated Outcome(s): supervision for basic sliding board transfers; min assist for carrr transfers PT Locomotion Anticipated Outcome(s): mod I for household wheelchair mobility; supervision/min A for ramp PT Recommendation Follow Up Recommendations: Outpatient PT (for prosthetic training) Patient destination: Home Equipment Details: patient has rolling walker, wheelchair, and bedside commode from previous admission  Skilled Therapeutic Intervention Patient participated in initial evaluation. Patient propelled wheelchair with bilateral UE 200 feet in controlled environment taking longer time, rest breaks, and occasional cueing to avoid obstacles during turns. Patient required cueing/physical assist for manipulation of wheelchair parts. Patient educated on  sliding board transfers from wheelchair to mat - requiring placement of board and mod to max assist for scooting across board depending on difference in surface level. Patient tends to have posterior bias and needs physical assist to keep weight forward. Patient worked on static and dynamic sitting edge of mat with and without UE  support. Patient sit to supine with supervision. Patient performed LE exercises x 10 for hip abduction and hip/knee flexion to maintain ROM and strength for pre-prosthesis. Patient required min assist/cueing to roll to right and left. Patient performed hip extension in sidelying to stretch hip flexors and maintain AROM. Patient supine to sit with mod assist. Patient performed sliding board transfer mat to wheelchair with mod assist. Patient propelled wheelchair back to room - 200+ feet. Patient performed sliding board transfer wheelchair to bed - uphill - with max assist. Patient required max assist to reposition self in bed. Patient left in bed with all items in reach.  PT Evaluation Precautions/Restrictions Precautions Precautions: Fall Other Brace/Splint: prosthesis through [employer] with [name] not delivered yet General Chart Reviewed: Yes Response to Previous Treatment: Patient with no complaints from previous session. Family/Caregiver Present: No Vital Signs  Pain Pain Assessment Pain Assessment: 0-10 Pain Score: 8  Pain Type: Phantom pain Pain Location: Leg Pain Orientation: Right Pain Descriptors / Indicators: Sore;Aching Pain Onset: On-going Home Living/Prior Functioning Home Living Available Help at Discharge: Family;Available 24 hours/day Type of Home: House Home Access: Ramped entrance Home Layout: One level  Lives With: Spouse;Daughter Prior Function Level of Independence: Needs assistance with tranfers (primarily used w/c PTA; used RW to stand pivot with wife's assistt) Vision/Perception     Cognition Overall Cognitive Status: Within Functional Limits for tasks assessed Arousal/Alertness: Awake/alert Orientation Level: Oriented to person;Oriented to place;Oriented to situation (reported date as the 28th - got month and year correct) Sensation Sensation Light Touch: Appears Intact Stereognosis: Not tested Hot/Cold: Not tested Proprioception: Not tested Additional  Comments: patient reports some numbness in fingertips. Coordination Gross Motor Movements are Fluid and Coordinated: Yes Fine Motor Movements are Fluid and Coordinated: Yes Motor  Motor Motor: Within Functional Limits Motor - Skilled Clinical Observations: generalized weakness  Mobility Bed Mobility Bed Mobility: Rolling Right;Rolling Left;Supine to Sit;Sit to Supine Rolling Right: 4: Min assist (on mat in gym) Rolling Left: 4: Min assist (on mat in gym) Supine to Sit: 3: Mod assist Supine to Sit Details (indicate cue type and reason): patient able to come up on elbows slightly but required moderate lifting assist to get to long sitting position on mat Sit to Supine: 5: Supervision Transfers Transfers: Yes Lateral/Scoot Transfers: With slide board;2: Max assist Lateral/Scoot Transfer Details (indicate cue type and reason): patient required assistanc ewith placement of board. depending on difference in surface height, patient was mod to max assist for sliding board transfer. patient tends to lean back and needs facilitation to keep weight forward. Ship broker: Yes Wheelchair Assistance: 5: Supervision (patient required occasional cueing to avoid objects. patient propelled chair in controlled setting.) Wheelchair Propulsion: Both upper extremities Wheelchair Parts Management: Needs assistance Distance: 200  Trunk/Postural Assessment  Cervical Assessment Cervical Assessment: Within Functional Limits Thoracic Assessment Thoracic Assessment: Within Functional Limits Lumbar Assessment Lumbar Assessment:  (posterior pelvic tilt) Postural Control Postural Control: Deficits on evaluation Trunk Control: patients with posterior bias however able to maintain sitting without UE support on mat and take some resistance in each direction.   Balance Balance Balance Assessed: Yes Static Sitting Balance Static Sitting -  Balance Support: No upper extremity  supported Static Sitting - Level of Assistance: 5: Stand by assistance Static Sitting - Comment/# of Minutes: patient able to sit edge of mat without UE support x 5 minutes. Patient able to take resistance in all directions and maintain upright balance. Patient able to reach ~ 5 inches out of base of support each direction with supervision for balance. Dynamic Sitting Balance Dynamic Sitting - Level of Assistance: 1: +1 Total assist Extremity Assessment  RUE Assessment RUE Assessment: Within Functional Limits LUE Assessment LUE Assessment: Within Functional Limits RLE Assessment RLE Assessment: Exceptions to [facility] RLE AROM (degrees) Overall AROM Right Lower Extremity: Deficits RLE Overall AROM Comments: patient has active knee flexion to 88 degrees. may be limited by dressing. RLE Strength RLE Overall Strength: Deficits;Due to pain RLE Overall Strength Comments: grossly 3+ throughout; unable to fully resist due to pain. LLE Assessment LLE Assessment: Exceptions to WFL LLE AROM (degrees) Overall AROM Left Lower Extremity: Deficits LLE Overall AROM Comments: Knee flexion limited to 90 degrees LLE Strength LLE Overall Strength: Within Functional Limits for tasks assessed  FIM:  FIM - Control and instrumentation engineer Devices: Sliding board Bed/Chair Transfer: 2: Chair or W/C > Bed: Max A (lift and lower assist);5: Sit > Supine: Supervision (verbal cues/safety issues);3: Supine > Sit: Mod A (lifting assist/Pt. 50-74%/lift 2 legs FIM - Locomotion: Wheelchair Distance: 200 Locomotion: Wheelchair: 5: Travels 150 ft or more: maneuvers on rugs and over door sills with supervision, cueing or coaxing FIM - Locomotion: Ambulation Locomotion: Ambulation: 0: Activity did not occur FIM - Locomotion: Stairs Locomotion: Stairs: 0: Activity did not occur   Refer to Care Plan for Long Term Goals  Recommendations for other services: None  Discharge Criteria: Patient will be  discharged from PT if patient refuses treatment 3 consecutive times without medical reason, if treatment goals not met, if there is a change in medical status, if patient makes no progress towards goals or if patient is discharged from hospital.  The above assessment, treatment plan, treatment alternatives and goals were discussed and mutually agreed upon: by patient  Sanjuana Letters 02/03/2014, 11:45 AM

## 2014-02-03 NOTE — Progress Notes (Signed)
Patient information reviewed and entered into eRehab system by Torry Istre, RN, CRRN, PPS Coordinator.  Information including medical coding and functional independence measure will be reviewed and updated through discharge.     Per nursing patient was given "Data Collection Information Summary for Patients in Inpatient Rehabilitation Facilities with attached "Privacy Act Statement-Health Care Records" upon admission.  

## 2014-02-03 NOTE — H&P (View-Only) (Signed)
Physical Medicine and Rehabilitation Admission H&P      No chief complaint on file. : Chief Complaint:Leg pain   HPI: [name] [name] is a 68 y.o. right-handed male with history of nonischemic cardiomyopathy with ICD, hypertension, chronic systolic congestive heart failure, PAF, diabetes mellitus with peripheral neuropathy and left BKA secondary to osteomyelitis received inpatient rehabilitation services in March of 2015 and currently awaiting prosthesis. Patient lives with his wife primarily using a wheelchair prior to admission and a walker for stand pivot transfers. Admitted 01/30/2014 with nonhealing right foot ulcers and findings of osteomyelitis. No relief with conservative care. Underwent right transtibial amputation 01/30/2014 per Dr.Xu. Postoperative pain management. Acute blood loss anemia 8.6 and monitored. Aspirin and Plavix resumed postoperatively for history of CAD. Physical therapy evaluation completed 01/31/2014 with recommendations of physical medicine rehabilitation consult.Admit for a comprehensive rehab program.   Patient remembers new from prior rehabilitation stay. Wife is at bedside. She is happy he is coming to rehabilitation again. Patient denies any pains. Oral medications seems to be helping. No phantom limb pain. ROS Review of Systems   Cardiovascular: Positive for leg swelling.   Gastrointestinal: Positive for constipation.   GERD   Neurological: Positive for weakness.   Psychiatric/Behavioral: Positive for memory loss.   All other systems reviewed and are negative    Past Medical History   Diagnosis  Date   .  GERD (gastroesophageal reflux disease)     .  Obesity     .  Cardiomyopathy, nonischemic         a. Cath 2003: mild nonobstructive CAD, EF 25% at that time.   .  Hypertension     .  Chronic systolic CHF (congestive heart failure)         a. NICM EF 25% dating back to at least 2003.   [name]  PAF (paroxysmal atrial fibrillation)         a. Noted on  ICD interrogation 2012;  b. coumadin d/c'd 01/2013.   [name]  NSVT (nonsustained ventricular tachycardia)         a. Noted on ICD interrogation in 2011.   [name]  LBBB (left bundle branch block)     .  Critical lower limb ischemia     .  High cholesterol     .  PAD (peripheral artery disease)         a. 08/2013 Periph Angio/PTA: Abd Ao nl, RLE- 3v runoff, PT diff dzs, AT 90p, LLE 2v runoff, PT 100, AT 14m(diamondback ORA/chocolate balloon PTA).   .  Type II diabetes mellitus     .  Uncontrolled pain, Lt toe  09/21/2013   .  Gangrenous toe, Lt toe  09/21/2013   .  CAD (coronary artery disease)         a. Nonobstructive by cath 09/2001.   .[name] Automatic implantable cardioverter-defibrillator in situ         a. 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   .  Dementia     .  Arthritis     .  History of blood transfusion     .  Complication of anesthesia     .  PONV (postoperative nausea and vomiting)      Past Surgical History   Procedure  Laterality  Date   .  Lithotripsy    2001   .  Cervical spine surgery    1994   .  Cardiac defibrillator placement  06/2009       WITH GENERATOR REPLACED; BiV ICD   .  US echocardiography    03/21/2008       EF 30-35%   .  Cardiovascular stress test    03/20/2009       EF 33%   .  Transluminal atherectomy tibial artery  Left  09/12/2013   .  Cardiac catheterization    10/01/2001       THERE WAS GLOBAL HYPOKINESIS AND EF 25%. THERE APPEARED TO BE GLOBAL DECREASE IN WALL MOTION   .  Toe amputation    10/04/2013       LEFT GREAT TOE AND 4TH TOE   /   DR Erlinda Hong   .  Amputation  Left  10/04/2013       Procedure: LEFT GREAT TOE AND SECOND TOE AMPUTATION;  Surgeon: Marianna Payment, MD;  Location: [facility];  Service: Orthopedics;  Laterality: Left;   .  Colonoscopy       .  Leg amputation below knee  Left  11/09/2013       DR Erlinda Hong   .  Amputation  Left  11/09/2013       Procedure: LEFT AMPUTATION BELOW KNEE;  Surgeon: Marianna Payment, MD;  Location:  [facility];  Service: Orthopedics;  Laterality: Left;   .  Coronary angioplasty       .  Amputation  Right  01/04/2014       Procedure: Doristine Devoid second and third toe amputation;  Surgeon: Marianna Payment, MD;  Location: [facility];  Service: Orthopedics;  Laterality: Right;   .  Amputation  Right  01/30/2014       Procedure: RIGHT BELOW KNEE AMPUTATION;  Surgeon: Marianna Payment, MD;  Location: [facility];  Service: Orthopedics;  Laterality: Right;    Family History   Problem  Relation  Age of Onset   .  Heart disease  Mother     .  Hypertension  Mother     .  Diabetes  Mother     .  Diabetes  Father      Social History: reports that he has never smoked. He has never used smokeless tobacco. He reports that he does not drink alcohol or use illicit drugs. Allergies: No Known Allergies Medications Prior to Admission   Medication  Sig  Dispense  Refill   .  aspirin EC 81 MG tablet  Take 1 tablet (81 mg total) by mouth daily.         [name]  atorvastatin (LIPITOR) 10 MG tablet  Take 10 mg by mouth daily.           .  carvedilol (COREG) 12.5 MG tablet  Take 12.5 mg by mouth 2 (two) times daily with a meal.         .  clopidogrel (PLAVIX) 75 MG tablet  Take 75 mg by mouth daily with breakfast.          .  esomeprazole (NEXIUM) 40 MG capsule  Take 40 mg by mouth daily as needed (acid reflux).          .  furosemide (LASIX) 40 MG tablet  Take 1 tablet (40 mg total) by mouth 2 (two) times daily.   60 tablet   1   .  HYDROcodone-acetaminophen (NORCO/VICODIN) 5-325 MG per tablet  Take 1-2 tablets by mouth every 4 (four) hours as needed for moderate pain.   60 tablet   0   .  insulin aspart (NOVOLOG FLEXPEN) 100 UNIT/ML FlexPen  Inject 4 Units into the skin 2 (two) times daily as needed for high blood sugar (cbg over 140).         .  Insulin Detemir (LEVEMIR FLEXPEN) 100 UNIT/ML Pen  Inject 35 Units into the skin at bedtime.         .  iron polysaccharides (NIFEREX) 150 MG capsule  Take 1 capsule (150 mg total) by mouth  daily.   30 capsule   1   .  metFORMIN (GLUCOPHAGE) 500 MG tablet  Take 1 tablet (500 mg total) by mouth 2 (two) times daily.   60 tablet      .  Multiple Vitamin (MULTIVITAMIN WITH MINERALS) TABS tablet  Take 1 tablet by mouth daily.         .  nitroGLYCERIN (NITRODUR - DOSED IN MG/24 HR) 0.2 mg/hr patch  Place 0.4 mg onto the skin daily. Apply to upper part of foot         .  potassium chloride SA (K-DUR,KLOR-CON) 20 MEQ tablet  Take 1 tablet (20 mEq total) by mouth 2 (two) times daily.   60 tablet   1   .  pregabalin (LYRICA) 75 MG capsule  Take 1 capsule (75 mg total) by mouth 2 (two) times daily.   60 capsule   1   .  senna-docusate (SENOKOT-S) 8.6-50 MG per tablet  Take 1 tablet by mouth 2 (two) times daily.   60 tablet   1   .  spironolactone (ALDACTONE) 25 MG tablet  Take 12.5 mg by mouth at bedtime.          .  vitamin E (VITAMIN E) 1000 UNIT capsule  Take 1,000 Units by mouth daily.            Home: Home Living Family/patient expects to be discharged to:: Inpatient rehab Living Arrangements: Spouse/significant other Available Help at Discharge: Family;Available 24 hours/day Type of Home: House Home Access: Stairs to enter [employer] of Steps: 2 Entrance Stairs-Rails:  (Pt does not remember) Home Layout: One level Home Equipment: None Additional Comments: Pt report of home living information is questionable, does not match previous reports from prior hospital admission in May.  Pt drowsy and limited in participation today.    Functional History: Prior Function Comments: Pt unable to report on prior function due to drowsiness/nausea during therapy today.   Functional Status:   Mobility: Bed Mobility Overal bed mobility: Needs Assistance Bed Mobility: Supine to Sit Supine to sit: Mod assist;HOB elevated General bed mobility comments: Pt needed mod-A to finish powering up to sitting and requires sustained mod-A support to prevent fall  backward. Transfers Overall transfer level: Needs assistance Equipment used: None Transfers: Lateral/Scoot Transfers Anterior-Posterior transfers: Max assist;+2 physical assistance  Lateral/Scoot Transfers: Max assist General transfer comment: Pt was able to help slide laterally with bil. UEs, but needed max-A to slide him laterally over EOB to chair using bed pad.   ADL:   Cognition: Cognition Overall Cognitive Status: Within Functional Limits for tasks assessed Orientation Level: Oriented X4 Cognition Arousal/Alertness: Awake/alert Behavior During Therapy: WFL for tasks assessed/performed Overall Cognitive Status: Within Functional Limits for tasks assessed Difficult to assess due to: Level of arousal   Physical Exam: Blood pressure 109/66, pulse 97, temperature 99.4 F (37.4 C), temperature source Oral, resp. rate 16, height _0  (1.753 m), weight 78.472 kg (173 lb), SpO2 98.00%. Physical Exam HENT:   Head: Normocephalic.  Eyes: EOM are normal.   Neck: Normal range of motion. Neck supple. No thyromegaly present.   Cardiovascular: Normal rate and regular rhythm.   Respiratory: Effort normal and breath sounds normal. No respiratory distress.   GI: Soft. Bowel sounds are normal. He exhibits no distension.  Neurological: He is alert.  Patient was able follow commands as well as provide his name, age, date of birth.   Skin:  Left BKA is well healed. Right BKA with postoperative dressing appropriately tender   Motor 5/5 in BUE   4/5 in Bilateral HF    Results for orders placed during the hospital encounter of 01/30/14 (from the past 48 hour(s))   GLUCOSE, CAPILLARY     Status: Abnormal     Collection Time      01/31/14 11:08 AM       Result  Value  Ref Range     Glucose-Capillary  124 (*)  70 - 99 mg/dL     Comment 1  Notify RN        Comment 2  Documented in Chart      GLUCOSE, CAPILLARY     Status: Abnormal     Collection Time      01/31/14  4:02 PM       Result   Value  Ref Range     Glucose-Capillary  143 (*)  70 - 99 mg/dL     Comment 1  Notify RN        Comment 2  Documented in Chart      GLUCOSE, CAPILLARY     Status: Abnormal     Collection Time      01/31/14 10:17 PM       Result  Value  Ref Range     Glucose-Capillary  284 (*)  70 - 99 mg/dL   BASIC METABOLIC PANEL     Status: Abnormal     Collection Time      02/01/14  6:45 AM       Result  Value  Ref Range     Sodium  141   137 - 147 mEq/L     Potassium  4.2   3.7 - 5.3 mEq/L     Chloride  104   96 - 112 mEq/L     CO2  26   19 - 32 mEq/L     Glucose, Bld  224 (*)  70 - 99 mg/dL     BUN  10   6 - 23 mg/dL     Creatinine, Ser  0.85   0.50 - 1.35 mg/dL     Calcium  8.1 (*)  8.4 - 10.5 mg/dL     GFR calc non Af Amer  86 (*)  >90 mL/min     GFR calc Af Amer  >90   >90 mL/min     Comment:  (NOTE)        The eGFR has been calculated using the CKD EPI equation.        This calculation has not been validated in all clinical situations.        eGFR's persistently <90 mL/min signify possible Chronic Kidney        Disease.   GLUCOSE, CAPILLARY     Status: Abnormal     Collection Time      02/01/14  7:12 AM       Result  Value  Ref Range     Glucose-Capillary  217 (*)  70 - 99  mg/dL   GLUCOSE, CAPILLARY     Status: Abnormal     Collection Time      02/01/14 11:10 AM       Result  Value  Ref Range     Glucose-Capillary  132 (*)  70 - 99 mg/dL     Comment 1  Notify RN      GLUCOSE, CAPILLARY     Status: Abnormal     Collection Time      02/01/14  4:08 PM       Result  Value  Ref Range     Glucose-Capillary  147 (*)  70 - 99 mg/dL     Comment 1  Notify RN      GLUCOSE, CAPILLARY     Status: Abnormal     Collection Time      02/01/14  9:15 PM       Result  Value  Ref Range     Glucose-Capillary  222 (*)  70 - 99 mg/dL     Comment 1  Notify RN      GLUCOSE, CAPILLARY     Status: None     Collection Time      02/02/14  6:23 AM       Result  Value  Ref Range     Glucose-Capillary   89   70 - 99 mg/dL     Comment 1  Notify RN       No results found.         Medical Problem List and Plan: 1. Functional deficits secondary to Right BKA 01/30/2014 and History L BKA March 2015 and await prosthesis per [employer] 2.  DVT Prophylaxis/Anticoagulation: N/A Bilat BKA 3. Pain Management:lyrica 75 mg BID, Hydrocodone,robaxin as needed 4. Acute Blood Loss anemia.Continue Niferex.Follow up CBC 5. Neuropsych: This patient is capable of making decisions on his own behalf. 6. Diabetes mellitus with peripheral neuropathy.Lattest hemoglobin A1c 8.6.Levemir 35 unit daily. Check CBG Ac/HS.Diabetic teaching 7.NICM/ICD.continue aspirin and plavix.no chest pain or shortness of breath 8.Hypertension.Coreg 12.5 mg daily.Monitor wiyh increased mobility 9.Chronic systolic CHF.Lasix 40 mg BID.Monitor for any signs of fluid overload. 10.Hyperlipidemia 10 mg daily 11.GERD.Protonix     Post Admission Physician Evaluation: Functional deficits secondary  to right below knee amputation on 01/30/2014 secondary to osteomyelitis in a patient with previous left below knee amputation in March of 2015. Patient is admitted to receive collaborative, interdisciplinary care between the physiatrist, rehab nursing staff, and therapy team. Patient's level of medical complexity and substantial therapy needs in context of that medical necessity cannot be provided at a lesser intensity of care such as a SNF. Patient has experienced substantial functional loss from his/her baseline which was documented above under the "Functional History" and "Functional Status" headings.  Judging by the patient's diagnosis, physical exam, and functional history, the patient has potential for functional progress which will result in measurable gains while on inpatient rehab.  These gains will be of substantial and practical use upon discharge  in facilitating mobility and self-care at the household level. Physiatrist will provide 24  hour management of medical needs as well as oversight of the therapy plan/treatment and provide guidance as appropriate regarding the interaction of the two. 24 hour rehab nursing will assist with bladder management, bowel management, safety, skin/wound care, disease management, medication administration, pain management and patient education  and help integrate therapy concepts, techniques,education, etc. PT will assess and treat for/with: pre gait, gait training, endurance , safety, equipment, neuromuscular  re education.   Goals are: Sup . OT will assess and treat for/with: ADLs, Cognitive perceptual skills, Neuromuscular re education, safety, endurance, equipment.   Goals are: Mod I. SLP will assess and treat for/with:  NA.  Goals are: NA. Case Management and Social Worker will assess and treat for psychological issues and discharge planning. Team conference will be held weekly to assess progress toward goals and to determine barriers to discharge. Patient will receive at least 3 hours of therapy per day at least 5 days per week. ELOS: 7-10d        Prognosis:  good  [name] M.D. [facility] FAAPM&R (Sports Med, Neuromuscular Med) Diplomate Am Board of Electrodiagnostic Med  02/02/2014

## 2014-02-03 NOTE — Interval H&P Note (Signed)
[name] [name] was admitted today to Inpatient Rehabilitation with the diagnosis of R BKA.  The patient's history has been reviewed, patient examined, and there is no change in status.  Patient continues to be appropriate for intensive inpatient rehabilitation.  I have reviewed the patient's chart and labs.  Questions were answered to the patient's satisfaction.  [name] 02/03/2014, 6:28 AM

## 2014-02-03 NOTE — Progress Notes (Signed)
Physical Therapy Session Note  Patient Details  Name: FRANCISCA HARBUCK MRN: [id number] Date of Birth: 1946-08-06  Today's Date: 02/03/2014 Time: 2440-1027 Time Calculation (min): 30 min  Short Term Goals: Week 1:  PT Short Term Goal 1 (Week 1): STG = LTG due to ELOS  Skilled Therapeutic Interventions/Progress Updates:  1:1. Pt received sitting on standard toilet attempting to have BM, but unsuccessful. Pt req Ax2 persons to be lifted from toilet>w/c. Focus this session on safe SBT w/c>drop arm commode. Pt req Ax2persons for completion of transfer, but demonstrated improved ability to assist w/ B UE. Anticipate that pt would req decreased level of physical assist if SBT completed w/ pants pulled up vs. Down due to decreased friction from board. Pants down due to initially sitting on standard toilet. Pt left on drop on commode in care of nurse techs due to request for increased time to attempt BM. Safety plan updated for toileting recommendation.   Therapy Documentation Precautions:  Precautions Precautions: Fall Other Brace/Splint: prosthesis through [employer] with [name] not delivered yet Restrictions Weight Bearing Restrictions: Yes RLE Weight Bearing: Non weight bearing LLE Weight Bearing: Non weight bearing  See FIM for current functional status  Therapy/Group: Individual Therapy  Gilmore Laroche 02/03/2014, 4:51 PM

## 2014-02-04 ENCOUNTER — Inpatient Hospital Stay (HOSPITAL_COMMUNITY): Payer: Medicare Other | Admitting: *Deleted

## 2014-02-04 ENCOUNTER — Inpatient Hospital Stay (HOSPITAL_COMMUNITY): Payer: Medicare Other | Admitting: Physical Therapy

## 2014-02-04 LAB — GLUCOSE, CAPILLARY
GLUCOSE-CAPILLARY: 141 mg/dL — AB (ref 70–99)
GLUCOSE-CAPILLARY: 190 mg/dL — AB (ref 70–99)
GLUCOSE-CAPILLARY: 128 mg/dL — AB (ref 70–99)
GLUCOSE-CAPILLARY: 237 mg/dL — AB (ref 70–99)

## 2014-02-04 MED ORDER — BISACODYL 10 MG RE SUPP
10.0000 mg | Freq: Once | RECTAL | Status: AC
  Administered 2014-02-04: 10 mg via RECTAL
  Filled 2014-02-04: qty 1

## 2014-02-04 MED ORDER — SENNOSIDES-DOCUSATE SODIUM 8.6-50 MG PO TABS
2.0000 | ORAL_TABLET | Freq: Two times a day (BID) | ORAL | Status: DC
  Administered 2014-02-04 – 2014-02-14 (×21): 2 via ORAL
  Filled 2014-02-04 (×20): qty 2

## 2014-02-04 NOTE — IPOC Note (Signed)
Overall Plan of Care [facility]) Patient Details Name: MITCHELLE GOERNER MRN: [id number] DOB: 1946-04-25  Admitting Diagnosis: BKA  Hospital Problems: Active Problems:   S/P bilateral BKA (below knee amputation)     Functional Problem List: Nursing Bowel;Endurance;Medication Management;Motor;Pain;Safety;Sensory;Skin Integrity  PT Balance;Pain  OT Balance;Endurance;Motor;Pain;Perception;Safety;Skin Integrity  SLP    TR         Basic ADL's: OT Grooming;Bathing;Dressing;Toileting     Advanced  ADL's: OT       Transfers: PT Bed Mobility;Bed to Chair;Car  OT Toilet;Tub/Shower     Locomotion: Advertising account planner;Other (comment) (ramp)     Additional Impairments: OT None  SLP        TR      Anticipated Outcomes Item Anticipated Outcome  Self Feeding no goal set  Swallowing      Basic self-care  supervision  Toileting  supervision    Bathroom Transfers supervision  Bowel/Bladder  Continent of bowel and bladder.  Transfers  supervision for basic sliding board transfers; min assist for carrr transfers  Locomotion  mod I for household wheelchair mobility; supervision/min A for ramp  Communication     Cognition     Pain  </=4  Safety/Judgment  No falls with injury   Therapy Plan: PT Intensity: Minimum of 1-2 x/day ,45 to 90 minutes PT Frequency: 5 out of 7 days PT Duration Estimated Length of Stay: 10 - 12 days OT Intensity: Minimum of 1-2 x/day, 45 to 90 minutes OT Frequency: 5 out of 7 days OT Duration/Estimated Length of Stay: 10-12 days         Team Interventions: Nursing Interventions Patient/Family Education;Medication Management;Bowel Management;Pain Management;Skin Care/Wound Management;Psychosocial Support  PT interventions Balance/vestibular training;Discharge planning;DME/adaptive equipment instruction;Functional mobility training;Pain management;Patient/family education;Therapeutic Activities;Therapeutic Exercise;UE/LE Strength  taining/ROM;Wheelchair propulsion/positioning  OT Interventions Balance/vestibular training;Community reintegration;Discharge planning;Pain management;Skin care/wound managment;UE/LE Coordination activities;UE/LE Strength taining/ROM;Self Care/advanced ADL retraining;Therapeutic Exercise;Psychosocial support;Functional mobility training;Patient/family education;Therapeutic Activities;Wheelchair propulsion/positioning;DME/adaptive equipment instruction  SLP Interventions    TR Interventions    SW/CM Interventions      Team Discharge Planning: Destination: PT-Home ,OT- Home , SLP-  Projected Follow-up: PT-Outpatient PT (for prosthetic training), OT-  Home health OT, SLP-  Projected Equipment Needs: PT- , OT- To be determined, SLP-  Equipment Details: PT-patient has rolling walker, wheelchair, and bedside commode from previous admission, OT-  Patient/family involved in discharge planning: PT- Patient,  OT-Patient, SLP-   MD ELOS: 10-12 days Medical Rehab Prognosis:  Excellent Assessment: The patient has been admitted for CIR therapies with the diagnosis of bilateral bka's. The team will be addressing functional mobility, strength, stamina, balance, safety, adaptive techniques and equipment, self-care, bowel and bladder mgt, patient and caregiver education, pain mgt, sliding board mgt, egosupport, community reintegration. Goals have been set at supervision to mod I at the wheelchair level. Meredith Staggers, MD, FAAPMR      See Team Conference Notes for weekly updates to the plan of care

## 2014-02-04 NOTE — Progress Notes (Signed)
Physical Therapy Session Note  Patient Details  Name: [name] [name] MRN: [id number] Date of Birth: Jun 29, 1946  Today's Date: 02/04/2014 Time: 1440-1520 Time Calculation (min): 40 min  Short Term Goals: Week 1:  PT Short Term Goal 1 (Week 1): STG = LTG due to ELOS  Skilled Therapeutic Interventions/Progress Updates:   Session focused on transfer training and increasing pt independence with w/c management and set up for transfers. Pt received semi reclined in bed, agreeable to therapy but requesting wider w/c before getting OOB. Therapist switched out w/c to wider w/c with gel cushion. Pt transferred supine > sit with HOB slightly elevated and using 2 rails with close S. Pt requires S-min guard for static sitting balance EOB. Pt requires assist for SB placement, performed SBT bed > w/c with mod A, vc's for anterior weightshift. W/c mobility using BUEs x 200 ft with S. In gym, patient instructed therapist on sequence with min cuing and assisted with setup of transfer for increased independence. Pt performed SBT w/c <> level mat table with min A. Pt able to place board and advance hips, therapist facilitating anterior weightshift and providing vc's for proper hand placement. Pt returned to room and left sitting in w/c with all needs within reach.   Therapy Documentation Precautions:  Precautions Precautions: Fall Other Brace/Splint: prosthesis through [employer] with [name] not delivered yet Restrictions Weight Bearing Restrictions: Yes RLE Weight Bearing: Non weight bearing LLE Weight Bearing: Non weight bearing General: Amount of Missed PT Time (min): 5 Minutes Missed Time Reason: Nursing care (RN tech BP readings) Vital Signs: Therapy Vitals Temp: 98.2 F (36.8 C) Temp src: Oral Pulse Rate: 82 Resp: 18 BP: 123/77 mmHg Patient Position (if appropriate): Lying Oxygen Therapy SpO2: 100 % O2 Device: None (Room air) Pain:  No c/o pain Locomotion : Wheelchair Mobility Distance:  200   See FIM for current functional status  Therapy/Group: Individual Therapy  Laretta Alstrom 02/04/2014, 4:24 PM

## 2014-02-04 NOTE — Progress Notes (Signed)
67 y.o. right-handed male with history of nonischemic cardiomyopathy with ICD, hypertension, chronic systolic congestive heart failure, PAF, diabetes mellitus with peripheral neuropathy and left BKA secondary to osteomyelitis received inpatient rehabilitation services in March of 2015 and currently awaiting prosthesis. Patient lives with his wife primarily using a wheelchair prior to admission and a walker for stand pivot transfers. Admitted 01/30/2014 with nonhealing right foot ulcers and findings of osteomyelitis. No relief with conservative care. Underwent right transtibial amputation 01/30/2014 per Dr.Xu  Subjective/Complaints: Bowel c/o needed manual disimpaction yest Review of Systems - Negative except mild R stump pain Objective: Vital Signs: Blood pressure 106/67, pulse 86, temperature 98.1 F (36.7 C), temperature source Oral, resp. rate 18, weight 76.5 kg (168 lb 10.4 oz), SpO2 93.00%. No results found. Results for orders placed during the hospital encounter of 02/02/14 (from the past 72 hour(s))  GLUCOSE, CAPILLARY     Status: Abnormal   Collection Time    02/02/14  9:49 PM      Result Value Ref Range   Glucose-Capillary 228 (*) 70 - 99 mg/dL  CBC WITH DIFFERENTIAL     Status: Abnormal   Collection Time    02/03/14  5:37 AM      Result Value Ref Range   WBC 7.1  4.0 - 10.5 K/uL   RBC 3.79 (*) 4.22 - 5.81 MIL/uL   Hemoglobin 8.8 (*) 13.0 - 17.0 g/dL   HCT 27.1 (*) 39.0 - 52.0 %   MCV 71.5 (*) 78.0 - 100.0 fL   MCH 23.2 (*) 26.0 - 34.0 pg   MCHC 32.5  30.0 - 36.0 g/dL   RDW 17.8 (*) 11.5 - 15.5 %   Platelets 308  150 - 400 K/uL   Neutrophils Relative % 78 (*) 43 - 77 %   Neutro Abs 5.6  1.7 - 7.7 K/uL   Lymphocytes Relative 10 (*) 12 - 46 %   Lymphs Abs 0.7  0.7 - 4.0 K/uL   Monocytes Relative 9  3 - 12 %   Monocytes Absolute 0.7  0.1 - 1.0 K/uL   Eosinophils Relative 3  0 - 5 %   Eosinophils Absolute 0.2  0.0 - 0.7 K/uL   Basophils Relative 0  0 - 1 %   Basophils  Absolute 0.0  0.0 - 0.1 K/uL  COMPREHENSIVE METABOLIC PANEL     Status: Abnormal   Collection Time    02/03/14  5:37 AM      Result Value Ref Range   Sodium 142  137 - 147 mEq/L   Potassium 4.2  3.7 - 5.3 mEq/L   Chloride 101  96 - 112 mEq/L   CO2 27  19 - 32 mEq/L   Glucose, Bld 184 (*) 70 - 99 mg/dL   BUN 15  6 - 23 mg/dL   Creatinine, Ser 0.93  0.50 - 1.35 mg/dL   Calcium 9.0  8.4 - 10.5 mg/dL   Total Protein 7.3  6.0 - 8.3 g/dL   Albumin 2.5 (*) 3.5 - 5.2 g/dL   AST 13  0 - 37 U/L   ALT 6  0 - 53 U/L   Alkaline Phosphatase 59  39 - 117 U/L   Total Bilirubin <0.2 (*) 0.3 - 1.2 mg/dL   GFR calc non Af Amer 83 (*) >90 mL/min   GFR calc Af Amer >90  >90 mL/min   Comment: (NOTE)     The eGFR has been calculated using the CKD EPI equation.  This calculation has not been validated in all clinical situations.     eGFR's persistently <90 mL/min signify possible Chronic Kidney     Disease.  GLUCOSE, CAPILLARY     Status: Abnormal   Collection Time    02/03/14  7:13 AM      Result Value Ref Range   Glucose-Capillary 153 (*) 70 - 99 mg/dL   Comment 1 Notify RN    GLUCOSE, CAPILLARY     Status: Abnormal   Collection Time    02/03/14 11:12 AM      Result Value Ref Range   Glucose-Capillary 186 (*) 70 - 99 mg/dL   Comment 1 Notify RN    GLUCOSE, CAPILLARY     Status: Abnormal   Collection Time    02/03/14  4:56 PM      Result Value Ref Range   Glucose-Capillary 174 (*) 70 - 99 mg/dL  GLUCOSE, CAPILLARY     Status: Abnormal   Collection Time    02/03/14  9:23 PM      Result Value Ref Range   Glucose-Capillary 261 (*) 70 - 99 mg/dL  GLUCOSE, CAPILLARY     Status: Abnormal   Collection Time    02/04/14  7:50 AM      Result Value Ref Range   Glucose-Capillary 141 (*) 70 - 99 mg/dL     HEENT: poor dentition Cardio: RRR and no murmur Resp: CTA B/L and unlabored GI: BS positive and NT,ND Extremity:  Pulses positive and No Edema Skin:   Intact and Wound ACE wrap on R ,  stump shrinker Left Neuro: Alert/Oriented and Abnormal Motor 4/5 B HF, 5/5 B UE Musc/Skel:  Other bilateral BKA Gen NAD   Assessment/Plan: 1. 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 Physiatrist is providing close team supervision and 24 hour management of active medical problems listed below. Physiatrist and rehab team continue to assess barriers to discharge/monitor patient progress toward functional and medical goals. FIM: FIM - Bathing Bathing Steps Patient Completed: Chest;Right Arm;Left Arm;Abdomen;Right upper leg;Left upper leg Bathing: 3: Mod-Patient completes 5-7 26f 10 parts or 50-74%  FIM - Lower Body Dressing/Undressing Lower body dressing/undressing: 1: Total-Patient completed less than 25% of tasks     FIM - Diplomatic Services operational officer Devices: Sliding board;Bedside commode Toilet Transfers: 1-Two helpers  FIM - Architectural technologist Transfer: 2: Chair or W/C > Bed: Max A (lift and lower assist);5: Sit > Supine: Supervision (verbal cues/safety issues);3: Supine > Sit: Mod A (lifting assist/Pt. 50-74%/lift 2 legs  FIM - Locomotion: Wheelchair Distance: 200 Locomotion: Wheelchair: 5: Travels 150 ft or more: maneuvers on rugs and over door sills with supervision, cueing or coaxing FIM - Locomotion: Ambulation Locomotion: Ambulation: 0: Activity did not occur  Comprehension Comprehension Mode: Auditory Comprehension: 5-Follows basic conversation/direction: With extra time/assistive device  Expression Expression Mode: Verbal Expression: 5-Expresses basic needs/ideas: With extra time/assistive device  Social Interaction Social Interaction: 6-Interacts appropriately with others with medication or extra time (anti-anxiety, antidepressant).  Problem Solving Problem  Solving: 5-Solves complex 90% of the time/cues < 10% of the time  Memory Memory: 5-Recognizes or recalls 90% of the time/requires cueing < 10% of the time  Medical Problem List and Plan:  1. Functional deficits secondary to Right BKA 01/30/2014 and History L BKA March 2015 and await prosthesis per [employer]  2. DVT Prophylaxis/Anticoagulation: N/A Bilat BKA  3. Pain Management:lyrica 75 mg BID, Hydrocodone,robaxin as needed  4. Acute Blood Loss anemia.Continue Niferex.Follow up CBC  5. Neuropsych: This patient is capable of making decisions on his own behalf.  6. Diabetes mellitus with peripheral neuropathy.Lattest hemoglobin A1c 8.6.Levemir 35 unit daily. Check CBG Ac/HS.Diabetic teaching  7.NICM/ICD.continue aspirin and plavix.no chest pain or shortness of breath  8.Hypertension.Coreg 12.5 mg daily.Monitor wiyh increased mobility  9.Chronic systolic CHF.Lasix 40 mg BID.Monitor for any signs of fluid overload.  10.Hyperlipidemia 10 mg daily  11.GERD.Protonix 12. Constipation adjust laxatives  LOS (Days) 2 A FACE TO FACE EVALUATION WAS PERFORMED  Loy Little E 02/04/2014, 8:36 AM

## 2014-02-04 NOTE — Progress Notes (Signed)
Physical Therapy Session Note  Patient Details  Name: [name] [name] MRN: [id number] Date of Birth: 1946-07-15  Today's Date: 02/04/2014 Time:  8:50-9:35 (10min)  Short Term Goals: Week 1:  PT Short Term Goal 1 (Week 1): STG = LTG due to ELOS  Skilled Therapeutic Interventions/Progress Updates:  Tx focused on therex for strength and ROM, dynamic sitting balance, and transfer training.   Pt resting in bed with c/o burning and phantom pain. Instructed pt in desensitization techniques and pt called RN for meds. Pt educated on optimal positioning and alignment for pre-prostheitc training. Locked bed with LEs extended and left pt up with gravity assisted knee ext in WC at end of tx.   Instructed pt in supine, and seated therex including quad sets, glute sets, SAQ, and LAQ x20 bil each ex with cues for technique.   Pt required mod A for supine>sit x2 and sit>supine x2 with S only.   Pt needing S for static sitting balance and up to min A for dynamic sitting balance for anterior reaching tasks outside BOS to encourage anterior translation for safe transfers.   Performed sliding board transfer with Max A for advancing hips, balance and cues for efficient technique.       Therapy Documentation Precautions:  Precautions Precautions: Fall Other Brace/Splint: prosthesis through [employer] with [name] not delivered yet Restrictions Weight Bearing Restrictions: Yes RLE Weight Bearing: Non weight bearing LLE Weight Bearing: Non weight bearing    Vital Signs: Therapy Vitals Temp: 98.1 F (36.7 C) Temp src: Oral Pulse Rate: 86 Resp: 18 BP: 106/67 mmHg Patient Position (if appropriate): Lying Oxygen Therapy SpO2: 93 % O2 Device: None (Room air) Pain: 5/10 surgical and phantom pain. Pt called RN for meds. Educated pt on desensitization ex.      See FIM for current functional status  Therapy/Group: Individual Therapy Kennieth Rad, PT, DPT  02/04/2014, 9:09 AM

## 2014-02-04 NOTE — Plan of Care (Signed)
Problem: RH BOWEL ELIMINATION Goal: RH STG MANAGE BOWEL WITH ASSISTANCE STG Manage Bowel with mod Assistance.  Outcome: Not Progressing Had to be disimpacted today. Need scheduled bowel medicine.

## 2014-02-04 NOTE — Progress Notes (Signed)
Occupational Therapy Session Note  Patient Details  Name: [name] [name] MRN: [id number] Date of Birth: 09-11-1945  Today's Date: 02/04/2014 Time: 1100-1200 Time Calculation (min): 60 min  Short Term Goals: Week 1:  OT Short Term Goal 1 (Week 1): Pt will transfer to drop arm BSC with max A (anterior/ posterior transfer) OT Short Term Goal 2 (Week 1): Pt will don LB clothing (pants) with mod A with lateral leans  OT Short Term Goal 3 (Week 1): Pt will perform 1/3 toileting steps on toilet with lateral leans OT Short Term Goal 4 (Week 1): Pt will perform bed mobility in prep for ADL tasks with mod A  OT Short Term Goal 5 (Week 1): Pt will maintain static sitting wEOB/EOM with min A in prep for more dynmaic sitting balance during functioanl tasks Week 2:     Skilled Therapeutic Interventions/Progress Updates:     1st session:    Pt. Sitting in wc.  Propelled wc to sink to engaged in BADL.  Pt. Performed lateral leans for OT to assist with peri area cleaning and donning pants.  Pt. Did wc push up with wife assisting with push up and OT doing the clothes management.  Pt performed all grooming sitting at sink with supplies on table.  Pt. Reported he is suppose to get his new prosthesis on Monday.  Left in room with wife present.    Therapy Documentation Precautions:  Precautions Precautions: Fall Other Brace/Splint: prosthesis through [employer] with [name] not delivered yet Restrictions Weight Bearing Restrictions: Yes RLE Weight Bearing: Non weight bearing LLE Weight Bearing: Non weight bearing    Vital Signs: Therapy Vitals Pulse Rate: 94 BP: 104/61 mmHg Patient Position (if appropriate): Sitting Pain: Pain Assessment Pain Assessment: 0-10 Pain Score: 4 /10  1st session Pain Type: Acute pain;Surgical pain Pain Location: Leg Pain Orientation: Right Pain Descriptors / Indicators: Throbbing Pain Frequency: Constant Pain Onset: On-going Pain Intervention(s): Medication (See  eMAR)    Other Treatments:    2nd session:  Time:  1530-1615   (45 min) Pain:  3/10   Right leg Individual session:  Focus of treatment was transfer to dropped arm 3n1 commode chair with sliding board.   Pt. Sitting in wc upon OT arrival.  Transferred from wc>bed>dropped arm CC>bed.  Pt was minimal assist plus verbal cues for technique going to 3n1.  He doffed shorts before he transferred but left depends on. Pt. Stated he hopes he gets his prosthesis on Monday so this transfer will be easier.   Remained in bed with bed alarm on and call bell,phone within reach.    See FIM for current functional status  Therapy/Group: Individual Therapy  Lisa Roca 02/04/2014, 12:06 PM

## 2014-02-05 LAB — GLUCOSE, CAPILLARY
GLUCOSE-CAPILLARY: 310 mg/dL — AB (ref 70–99)
Glucose-Capillary: 221 mg/dL — ABNORMAL HIGH (ref 70–99)
Glucose-Capillary: 174 mg/dL — ABNORMAL HIGH (ref 70–99)
Glucose-Capillary: 214 mg/dL — ABNORMAL HIGH (ref 70–99)

## 2014-02-05 NOTE — Progress Notes (Signed)
68 y.o. right-handed male with history of nonischemic cardiomyopathy with ICD, hypertension, chronic systolic congestive heart failure, PAF, diabetes mellitus with peripheral neuropathy and left BKA secondary to osteomyelitis received inpatient rehabilitation services in March of 2015 and currently awaiting prosthesis. Patient lives with his wife primarily using a wheelchair prior to admission and a walker for stand pivot transfers. Admitted 01/30/2014 with nonhealing right foot ulcers and findings of osteomyelitis. No relief with conservative care. Underwent right transtibial amputation 01/30/2014 per Dr.Xu  Subjective/Complaints: Bowel movement after laxative Review of Systems - Negative except mild R stump pain Objective: Vital Signs: Blood pressure 123/75, pulse 84, temperature 98.5 F (36.9 C), temperature source Oral, resp. rate 15, weight 76.5 kg (168 lb 10.4 oz), SpO2 100.00%. No results found. Results for orders placed during the hospital encounter of 02/02/14 (from the past 72 hour(s))  GLUCOSE, CAPILLARY     Status: Abnormal   Collection Time    02/02/14  9:49 PM      Result Value Ref Range   Glucose-Capillary 228 (*) 70 - 99 mg/dL  CBC WITH DIFFERENTIAL     Status: Abnormal   Collection Time    02/03/14  5:37 AM      Result Value Ref Range   WBC 7.1  4.0 - 10.5 K/uL   RBC 3.79 (*) 4.22 - 5.81 MIL/uL   Hemoglobin 8.8 (*) 13.0 - 17.0 g/dL   HCT 27.1 (*) 39.0 - 52.0 %   MCV 71.5 (*) 78.0 - 100.0 fL   MCH 23.2 (*) 26.0 - 34.0 pg   MCHC 32.5  30.0 - 36.0 g/dL   RDW 17.8 (*) 11.5 - 15.5 %   Platelets 308  150 - 400 K/uL   Neutrophils Relative % 78 (*) 43 - 77 %   Neutro Abs 5.6  1.7 - 7.7 K/uL   Lymphocytes Relative 10 (*) 12 - 46 %   Lymphs Abs 0.7  0.7 - 4.0 K/uL   Monocytes Relative 9  3 - 12 %   Monocytes Absolute 0.7  0.1 - 1.0 K/uL   Eosinophils Relative 3  0 - 5 %   Eosinophils Absolute 0.2  0.0 - 0.7 K/uL   Basophils Relative 0  0 - 1 %   Basophils Absolute 0.0   0.0 - 0.1 K/uL  COMPREHENSIVE METABOLIC PANEL     Status: Abnormal   Collection Time    02/03/14  5:37 AM      Result Value Ref Range   Sodium 142  137 - 147 mEq/L   Potassium 4.2  3.7 - 5.3 mEq/L   Chloride 101  96 - 112 mEq/L   CO2 27  19 - 32 mEq/L   Glucose, Bld 184 (*) 70 - 99 mg/dL   BUN 15  6 - 23 mg/dL   Creatinine, Ser 0.93  0.50 - 1.35 mg/dL   Calcium 9.0  8.4 - 10.5 mg/dL   Total Protein 7.3  6.0 - 8.3 g/dL   Albumin 2.5 (*) 3.5 - 5.2 g/dL   AST 13  0 - 37 U/L   ALT 6  0 - 53 U/L   Alkaline Phosphatase 59  39 - 117 U/L   Total Bilirubin <0.2 (*) 0.3 - 1.2 mg/dL   GFR calc non Af Amer 83 (*) >90 mL/min   GFR calc Af Amer >90  >90 mL/min   Comment: (NOTE)     The eGFR has been calculated using the CKD EPI equation.  This calculation has not been validated in all clinical situations.     eGFR's persistently <90 mL/min signify possible Chronic Kidney     Disease.  GLUCOSE, CAPILLARY     Status: Abnormal   Collection Time    02/03/14  7:13 AM      Result Value Ref Range   Glucose-Capillary 153 (*) 70 - 99 mg/dL   Comment 1 Notify RN    GLUCOSE, CAPILLARY     Status: Abnormal   Collection Time    02/03/14 11:12 AM      Result Value Ref Range   Glucose-Capillary 186 (*) 70 - 99 mg/dL   Comment 1 Notify RN    GLUCOSE, CAPILLARY     Status: Abnormal   Collection Time    02/03/14  4:56 PM      Result Value Ref Range   Glucose-Capillary 174 (*) 70 - 99 mg/dL  GLUCOSE, CAPILLARY     Status: Abnormal   Collection Time    02/03/14  9:23 PM      Result Value Ref Range   Glucose-Capillary 261 (*) 70 - 99 mg/dL  GLUCOSE, CAPILLARY     Status: Abnormal   Collection Time    02/04/14  7:50 AM      Result Value Ref Range   Glucose-Capillary 141 (*) 70 - 99 mg/dL  GLUCOSE, CAPILLARY     Status: Abnormal   Collection Time    02/04/14 11:36 AM      Result Value Ref Range   Glucose-Capillary 190 (*) 70 - 99 mg/dL   Comment 1 Notify RN    GLUCOSE, CAPILLARY     Status:  Abnormal   Collection Time    02/04/14  4:51 PM      Result Value Ref Range   Glucose-Capillary 128 (*) 70 - 99 mg/dL  GLUCOSE, CAPILLARY     Status: Abnormal   Collection Time    02/04/14 10:01 PM      Result Value Ref Range   Glucose-Capillary 237 (*) 70 - 99 mg/dL   Comment 1 Notify RN       HEENT: poor dentition Cardio: RRR and no murmur Resp: CTA B/L and unlabored GI: BS positive and NT,ND Extremity:  Pulses positive and No Edema Skin:   Intact and Wound ACE wrap on R , stump shrinker Left Neuro: Alert/Oriented and Abnormal Motor 4/5 B HF, 5/5 B UE Musc/Skel:  Other bilateral BKA Gen NAD   Assessment/Plan: 1. 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 Physiatrist is providing close team supervision and 24 hour management of active medical problems listed below. Physiatrist and rehab team continue to assess barriers to discharge/monitor patient progress toward functional and medical goals. FIM: FIM - Bathing Bathing Steps Patient Completed: Chest;Right Arm;Left Arm;Abdomen;Right upper leg;Left upper leg;Front perineal area Bathing: 3: Mod-Patient completes 5-7 88f10 parts or 50-74%  FIM - Upper Body Dressing/Undressing Upper body dressing/undressing steps patient completed: Thread/unthread right sleeve of pullover shirt/dresss;Thread/unthread left sleeve of pullover shirt/dress;Put head through opening of pull over shirt/dress;Pull shirt over trunk Upper body dressing/undressing: 5: Supervision: Safety issues/verbal cues FIM - Lower Body Dressing/Undressing Lower body dressing/undressing steps patient completed: Thread/unthread right pants leg;Thread/unthread left pants leg Lower body dressing/undressing: 2: Max-Patient completed 25-49% of tasks  FIM - TMusicianDevices: Grab bar or rail for support Toileting: 1: Total-Patient  completed zero  steps, helper did all 3  FIM - Radio producer Devices: Sliding board;Bedside commode Toilet Transfers: 3-To toilet/BSC: Mod A (lift or lower assist);2-From toilet/BSC: Max A (lift and lower assist)  FIM - Control and instrumentation engineer Devices: Sliding board;Arm rests;Bed rails;HOB elevated Bed/Chair Transfer: 5: Supine > Sit: Supervision (verbal cues/safety issues);3: Chair or W/C > Bed: Mod A (lift or lower assist);2: Bed > Chair or W/C: Max A (lift and lower assist)  FIM - Locomotion: Wheelchair Distance: 200 Locomotion: Wheelchair: 5: Travels 150 ft or more: maneuvers on rugs and over door sills with supervision, cueing or coaxing FIM - Locomotion: Ambulation Locomotion: Ambulation: 0: Activity did not occur  Comprehension Comprehension Mode: Auditory Comprehension: 5-Follows basic conversation/direction: With no assist  Expression Expression Mode: Verbal Expression: 5-Expresses basic needs/ideas: With no assist  Social Interaction Social Interaction: 6-Interacts appropriately with others with medication or extra time (anti-anxiety, antidepressant).  Problem Solving Problem Solving: 5-Solves complex 90% of the time/cues < 10% of the time  Memory Memory: 5-Recognizes or recalls 90% of the time/requires cueing < 10% of the time  Medical Problem List and Plan:  1. Functional deficits secondary to Right BKA 01/30/2014 and History L BKA March 2015 and await prosthesis per [employer]  2. DVT Prophylaxis/Anticoagulation: N/A Bilat BKA  3. Pain Management:lyrica 75 mg BID, Hydrocodone,robaxin as needed  4. Acute Blood Loss anemia.Continue Niferex.Follow up CBC  5. Neuropsych: This patient is capable of making decisions on his own behalf.  6. Diabetes mellitus with peripheral neuropathy.Lattest hemoglobin A1c 8.6.Levemir 35 unit daily. Check CBG Ac/HS.Diabetic teaching  7.NICM/ICD.continue aspirin and plavix.no chest pain or  shortness of breath  8.Hypertension.Coreg 12.5 mg daily.Monitor wiyh increased mobility  9.Chronic systolic CHF.Lasix 40 mg BID.Monitor for any signs of fluid overload.  10.Hyperlipidemia 10 mg daily  11.GERD.Protonix 12. Constipation adjust laxatives  LOS (Days) 3 A FACE TO FACE EVALUATION WAS PERFORMED  KIRSTEINS,ANDREW E 02/05/2014, 6:57 AM

## 2014-02-06 ENCOUNTER — Inpatient Hospital Stay (HOSPITAL_COMMUNITY): Payer: Medicare Other | Admitting: *Deleted

## 2014-02-06 ENCOUNTER — Encounter (HOSPITAL_COMMUNITY): Payer: Medicare Other

## 2014-02-06 ENCOUNTER — Inpatient Hospital Stay (HOSPITAL_COMMUNITY): Payer: Medicare Other

## 2014-02-06 ENCOUNTER — Inpatient Hospital Stay (HOSPITAL_COMMUNITY): Payer: Medicare Other | Admitting: Physical Therapy

## 2014-02-06 DIAGNOSIS — S88119A Complete traumatic amputation at level between knee and ankle, unspecified lower leg, initial encounter: Secondary | ICD-10-CM

## 2014-02-06 DIAGNOSIS — I739 Peripheral vascular disease, unspecified: Secondary | ICD-10-CM

## 2014-02-06 DIAGNOSIS — L98499 Non-pressure chronic ulcer of skin of other sites with unspecified severity: Secondary | ICD-10-CM

## 2014-02-06 LAB — GLUCOSE, CAPILLARY
Glucose-Capillary: 180 mg/dL — ABNORMAL HIGH (ref 70–99)
Glucose-Capillary: 192 mg/dL — ABNORMAL HIGH (ref 70–99)
Glucose-Capillary: 235 mg/dL — ABNORMAL HIGH (ref 70–99)

## 2014-02-06 MED ORDER — INSULIN DETEMIR 100 UNIT/ML ~~LOC~~ SOLN
10.0000 [IU] | Freq: Every day | SUBCUTANEOUS | Status: DC
  Administered 2014-02-06 – 2014-02-08 (×3): 10 [IU] via SUBCUTANEOUS
  Filled 2014-02-06 (×3): qty 0.1

## 2014-02-06 NOTE — Progress Notes (Signed)
   Subjective:  Patient reports pain as mild.    Objective:   VITALS:   Filed Vitals:   02/05/14 0628 02/05/14 1717 02/05/14 2236 02/06/14 0500  BP: 123/75 118/58 109/60 111/75  Pulse: 84 96 90 84  Temp: 98.5 F (36.9 C)  98.9 F (37.2 C) 98.3 F (36.8 C)  TempSrc: Oral  Oral Oral  Resp: 15 20 19 19   Weight:      SpO2: 100% 100% 98% 95%    - incision c/d/i - able to full extend knee, no flexion contracture   Lab Results  Component Value Date   WBC 7.1 02/03/2014   HGB 8.8* 02/03/2014   HCT 27.1* 02/03/2014   MCV 71.5* 02/03/2014   PLT 308 02/03/2014     Assessment/Plan:     - dressing changed - continue CIR - will follow periodically - f/u in office in 1 week if patient is discharged - will have [employer] come talk to patient  Marianna Payment 02/06/2014, 7:56 AM [phone]

## 2014-02-06 NOTE — Care Management Note (Signed)
[facility] Individual Statement of Services  Patient Name:  [name] [name]  Date:  02/06/2014  Welcome to the [facility].  Our goal is to provide you with an individualized program based on your diagnosis and situation, designed to meet your specific needs.  With this comprehensive rehabilitation program, you will be expected to participate in at least 3 hours of rehabilitation therapies Monday-Friday, with modified therapy programming on the weekends.  Your rehabilitation program will include the following services:  Physical Therapy (PT), Occupational Therapy (OT), 24 hour per day rehabilitation nursing, Therapeutic Recreaction (TR), Case Management (Social Worker), Rehabilitation Medicine, Nutrition Services and Pharmacy Services  Weekly team conferences will be held on Tuesdays to discuss your progress.  Your Social Worker will talk with you frequently to get your input and to update you on team discussions.  Team conferences with you and your family in attendance may also be held.  Expected length of stay: 10-12 days  Overall anticipated outcome: supervision w/c level  Depending on your progress and recovery, your program may change. Your Social Worker will coordinate services and will keep you informed of any changes. Your Social Worker's name and contact numbers are listed  below.  The following services may also be recommended but are not provided by the [facility] will be made to provide these services after discharge if needed.  Arrangements include referral to agencies that provide these services.  Your insurance has been verified to be:  [facility] Medicare Your primary doctor is:  Josetta Huddle  Pertinent information will be shared with your doctor and your insurance company.  Social Worker:  [place],  [place] or ([phone]   Information discussed with and copy given to patient by: Lennart Pall, 02/06/2014, 10:18 AM

## 2014-02-06 NOTE — Progress Notes (Signed)
Occupational Therapy Session Note  Patient Details  Name: [name] [name] MRN: [id number] Date of Birth: May 30, 1946  Today's Date: 02/06/2014 Time: 0900-0957 Time Calculation (min): 57 min  Short Term Goals: Week 1:  OT Short Term Goal 1 (Week 1): Pt will transfer to drop arm BSC with max A (anterior/ posterior transfer) OT Short Term Goal 2 (Week 1): Pt will don LB clothing (pants) with mod A with lateral leans  OT Short Term Goal 3 (Week 1): Pt will perform 1/3 toileting steps on toilet with lateral leans OT Short Term Goal 4 (Week 1): Pt will perform bed mobility in prep for ADL tasks with mod A  OT Short Term Goal 5 (Week 1): Pt will maintain static sitting wEOB/EOM with min A in prep for more dynmaic sitting balance during functioanl tasks  Skilled Therapeutic Interventions/Progress Updates: ADL-retraining with focus on slide board transfer, adapted bathing, lower body dressing (supine).   Patient received seated in w/c and receptive for bathing in shower this session.  Patient completed slide board transfer from w/c to tub bench with extra time, manual facilitation to shift weight, and mod assist to lift buttocks off w/c and advance on slide board.   Patient required min verbal cues and manual contact to complete lateral lean to right while seated on bench, in order to wash buttocks.   Patient transferred back to w/c with only min assist to stabilize board and to steady patient during transfer.   Patient completed additional slide board transfer to bed, with min assist, and completed lower body dressing with min assist to pull up shorts at posterior.   Patient transferred back to w/c after min assist to rise to sit at edge of bed using bed rail.  Patient transferred to w/c with standby assist after setup and completed upper body dressing, grooming and hygiene with only setup assist.       Therapy Documentation Precautions:  Precautions Precautions: Fall Other Brace/Splint: prosthesis  through [employer] with [name] not delivered yet Restrictions Weight Bearing Restrictions: Yes RLE Weight Bearing: Non weight bearing LLE Weight Bearing: Non weight bearing  Pain: Pain Assessment Pain Score: 5  Pain Type: Surgical pain Pain Location: Leg Pain Orientation: Right Pain Intervention(s):  (declined meds; pain decreased to 3/10 during tx)  See FIM for current functional status  Therapy/Group: Individual Therapy  Second session: Time: 1330-1430 Time Calculation (min): 60 min  Pain Assessment: No pain  Skilled Therapeutic Interventions: OT upper extremity group exercises.  Patient participated in group exercises with emphasis on strengthening shoulder, back, chest and arms.  Patient required hand guidance to perform exercises correctly and 3 rest breaks during session to recover due to limited endurance.   Exercises included weighted-ball toss, chest press, chest pulls, overhead pull downs using thera-band, seated rowing, bicep curls and tricep extension.  Patient completed 2 sets of 10 repetitions of each exercise with min assist.  See FIM for current functional status  Therapy/Group: Group Therapy  [name] 02/06/2014, 9:58 AM

## 2014-02-06 NOTE — Progress Notes (Signed)
Physical Therapy Session Note  Patient Details  Name: [name] [name] MRN: [id number] Date of Birth: 1946-06-13  Today's Date: 02/06/2014 Time: 1030-1100 Time Calculation (min): 30 min  Short Term Goals: Week 1:  PT Short Term Goal 1 (Week 1): STG = LTG due to ELOS  Skilled Therapeutic Interventions/Progress Updates:   Pt received sitting in w/c, wife present for session. Pain in RLE 7/10, RN present for pain medication administration. W/c parts management for taking leg rests on/off with max verbal cues and mod A. SBT w/c > bed with min A after setup for anterior weightshift, pt performing scooting across board. Pt falling posteriorly into bed at end of transfer, mod A for sit > supine. R LE therex for strengthening/ROM: sidelying hip flexion/ext x 15, sidelying hip abd x 15, supine glute sets to fatigue. Pt requires manual facilitation to maintain sidelying position and decrease compensations. Pt required min A with heavy dependence on rail to roll supine > prone in bed and able to maintain prone B hip flexor stretch x 3 min. Pt preferring to stay in bed after tx due to pain medication making him drowsy. Pt left supine in bed with all needs within reach.     Therapy Documentation Precautions:  Precautions Precautions: Fall Other Brace/Splint: prosthesis through [employer] with [name] not delivered yet Restrictions Weight Bearing Restrictions: Yes RLE Weight Bearing: Non weight bearing LLE Weight Bearing: Non weight bearing  See FIM for current functional status  Therapy/Group: Individual Therapy  Laretta Alstrom 02/06/2014, 11:05 AM

## 2014-02-06 NOTE — Progress Notes (Signed)
Physical Therapy Session Note  Patient Details  Name: [name] [name] MRN: [id number] Date of Birth: 12-30-1945  Today's Date: 02/06/2014 Time: 7371-0626 Time Calculation (min): 30 min  Short Term Goals: Week 1:  PT Short Term Goal 1 (Week 1): STG = LTG due to ELOS  Skilled Therapeutic Interventions/Progress Updates:  Pt eating breakfast, HOB raised.  He politely declined starting tx until he had eaten; missed 15 min.  Bed mobility supine > sit in flat bed, no rails, with mod assist for trunk control, elevation of trunk.  bed> w/c to L, SBT with mod assist.  W/c propulsion x 150' with distant supervision. Assistance for w/c parts and cues for brakes. Therapeutic exercise performed with LE to increase strength for functional mobility: 2 x 10 R knee ext with isometric hold at end range; 1 x 10 each bil hip abd against resistance, L knee ext with isometric hold as above, L quad sets with back and UEs unsupported for core activation; 2 x 5 press up boosts in w/c attempting to clear bottom.    Therapy Documentation Precautions:  Precautions Precautions: Fall Other Brace/Splint: prosthesis through [employer] with [name] not delivered yet Restrictions Weight Bearing Restrictions: Yes RLE Weight Bearing: Non weight bearing LLE Weight Bearing: Non weight bearing      See FIM for current functional status  Therapy/Group: Individual Therapy  Sylvie Mifsud 02/06/2014, 5:11 PM

## 2014-02-06 NOTE — Progress Notes (Signed)
Social Work  Social Work Assessment and Plan  Patient Details  Name: [name] KOEPPEN MRN: [id number] Date of Birth: April 25, 1946  Today's Date: 02/06/2014  Problem List:  Patient Active Problem List   Diagnosis Date Noted  . S/P bilateral BKA (below knee amputation) 02/02/2014  . Osteomyelitis of right foot 01/30/2014  . 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 01/04/2014  . Osteomyelitis of toe of right foot 01/04/2014  . S/P Lt BKA 11/09/13 11/14/2013  . Acute blood loss anemia 11/11/2013  . Type II or unspecified type diabetes mellitus 11/09/2013  . Lower limb amputation, great toe 10/11/2013  . Lower limb amputation, other toe(s) 10/11/2013  . Chronic osteomyelitis of toe of left foot 10/04/2013  . Foot osteomyelitis, left 10/04/2013  . Uncontrolled pain, Lt toe 09/21/2013  . Gangrenous toe, Lt toe 09/21/2013  . PAD (peripheral artery disease) 09/13/2013  . Diabetes mellitus 09/13/2013  . Hyperlipidemia 09/13/2013  . PAF (paroxysmal atrial fibrillation) 09/13/2013  . Critical lower limb ischemia- s/p Rt anterior tibial PTA 12/29/13 in preparation for Rt BKA 08/30/2013  . 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 HTN (hypertension) 04/08/2011  . NICM- EF 20-25% echo 6/14 09/29/2008  . LBBB 09/29/2008  . SYSTOLIC HEART FAILURE, CHRONIC 09/29/2008   Past Medical History:  Past Medical History  Diagnosis Date  . GERD (gastroesophageal reflux disease)   . Obesity   . Cardiomyopathy, nonischemic     a. Cath 2003: mild nonobstructive CAD, EF 25% at that time.  . Hypertension   . Chronic systolic CHF (congestive heart failure)     a. NICM EF 25% dating back to at least 2003.  [name] PAF (paroxysmal atrial fibrillation)     a. Noted on ICD interrogation 2012;  b. coumadin d/c'd 01/2013.  [name] NSVT (nonsustained ventricular tachycardia)     a. Noted on ICD interrogation in 2011.  [name] LBBB (left bundle branch block)   . Critical lower limb ischemia   . High cholesterol    . PAD (peripheral artery disease)     a. 08/2013 Periph Angio/PTA: Abd Ao nl, RLE- 3v runoff, PT diff dzs, AT 90p, LLE 2v runoff, PT 100, AT 33m (diamondback ORA/chocolate balloon PTA).  . Type II diabetes mellitus   . Uncontrolled pain, Lt toe 09/21/2013  . Gangrenous toe, Lt toe 09/21/2013  . CAD (coronary artery disease)     a. Nonobstructive by cath 09/2001.  [name] Automatic implantable cardioverter-defibrillator in situ     a. 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  . Dementia   . Arthritis   . History of blood transfusion   . Complication of anesthesia   . PONV (postoperative nausea and vomiting)    Past Surgical History:  Past Surgical History  Procedure Laterality Date  . Lithotripsy  2001  . Cervical spine surgery  1994  . Cardiac defibrillator placement  06/2009    WITH GENERATOR REPLACED; BiV ICD  . US echocardiography  03/21/2008    EF 30-35%  . Cardiovascular stress test  03/20/2009    EF 33%  . Transluminal atherectomy tibial artery Left 09/12/2013  . Cardiac catheterization  10/01/2001    THERE WAS GLOBAL HYPOKINESIS AND EF 25%. THERE APPEARED TO BE GLOBAL DECREASE IN WALL MOTION  . Toe amputation  10/04/2013    LEFT GREAT TOE AND 4TH TOE   /   DR Erlinda Hong  . Amputation Left 10/04/2013    Procedure: LEFT GREAT  TOE AND SECOND TOE AMPUTATION;  Surgeon: Marianna Payment, MD;  Location: [facility];  Service: Orthopedics;  Laterality: Left;  . Colonoscopy    . Leg amputation below knee Left 11/09/2013    DR Erlinda Hong  . Amputation Left 11/09/2013    Procedure: LEFT AMPUTATION BELOW KNEE;  Surgeon: Marianna Payment, MD;  Location: [facility];  Service: Orthopedics;  Laterality: Left;  . Coronary angioplasty    . Amputation Right 01/04/2014    Procedure: Doristine Devoid second and third toe amputation;  Surgeon: Marianna Payment, MD;  Location: [facility];  Service: Orthopedics;  Laterality: Right;  . Amputation Right 01/30/2014    Procedure: RIGHT BELOW KNEE AMPUTATION;  Surgeon:  Marianna Payment, MD;  Location: [facility];  Service: Orthopedics;  Laterality: Right;   Social History:  reports that he has never smoked. He has never used smokeless tobacco. He reports that he does not drink alcohol or use illicit drugs.  Family / Support Systems Marital Status: Married Patient Roles: Spouse;Parent Spouse/Significant Other: wife, Corran Lalone @ [phone] or [phone] Children: one daughter in the home along with her son Anticipated Caregiver: wife, daughter, son, daughter, and pt's brother Ability/Limitations of Caregiver: wife works 65- 4 pm as a Actuary. She states brother and son helping when she and daughter work. Wife states she may have to quit her job to take care of pt 24/7. Wife has been providing min assist level Caregiver Availability: 24/7 Family Dynamics: pt and wife describe all family as supportive and willing to assist as they are able.  Social History Preferred language: English Religion: Baptist Cultural Background: NA Education: HS Read: Yes Write: Yes Employment Status: Retired Freight forwarder Issues: none Guardian/Conservator: none - per MD, pt capable of making decisions on his own behalf   Abuse/Neglect Physical Abuse: Denies Verbal Abuse: Denies Sexual Abuse: Denies Exploitation of patient/patient's resources: Denies Self-Neglect: Denies  Emotional Status Pt's affect, behavior adn adjustment status: pt pleasant, cooperative and making jokes during interview.  Denies any emotional distress.  Eager to get prosthesis and start training.   Recent Psychosocial Issues: Recent L BKA - good recovery Pyschiatric History: none Substance Abuse History: none  Patient / Family Perceptions, Expectations & Goals Pt/Family understanding of illness & functional limitations: Pt and wife with basic understanding of medical issues leading to right amputation, role of CIR and tx. Premorbid pt/family roles/activities: Pt's wife was  assisting with ADLs since first amputation.   Anticipated changes in roles/activities/participation: Little change anticipated as wife already providing caregiver support and brother and other family assisting as able. Pt/family expectations/goals: "I need him to be able to do transfers if he can...would like to have some training on prosthesis while we're here if possible."  [employer]: None Premorbid Home Care/DME Agencies: Other (Comment) [facility] RN was following for wound care) Transportation available at discharge: yes Resource referrals recommended: Support group (specify)  Discharge Planning Living Arrangements: Spouse/significant other;Children;Other (Comment) (a daughter and her children also live with pt and his wife) Support Systems: Spouse/significant other;Children;Other relatives Type of Residence: Private residence Insurance Resources: [employer] Resources: Radio broadcast assistant Screen Referred: No Living Expenses: Higher education careers adviser Management: Spouse Does the patient have any problems obtaining your medications?: No Home Management: wife Patient/Family Preliminary Plans: pt hopes to return home with wife who is considering stopping her job if needed. Social Work Anticipated Follow Up Needs: HH/OP Expected length of stay: ELOS 7 to 10 days  Clinical Impression Very pleasant  patient and his wife here after a 2nd LE amputation and familiar to CIR from prior BKA (March 2015).  Wife very supportive and will provide any needed assist upon d/c.  Will follow for support and d/c planning.  Zak Gondek 02/06/2014, 12:04 PM

## 2014-02-06 NOTE — Progress Notes (Signed)
68 y.o. right-handed male with history of nonischemic cardiomyopathy with ICD, hypertension, chronic systolic congestive heart failure, PAF, diabetes mellitus with peripheral neuropathy and left BKA secondary to osteomyelitis received inpatient rehabilitation services in March of 2015 and currently awaiting prosthesis. Patient lives with his wife primarily using a wheelchair prior to admission and a walker for stand pivot transfers. Admitted 01/30/2014 with nonhealing right foot ulcers and findings of osteomyelitis. No relief with conservative care. Underwent right transtibial amputation 01/30/2014 per Dr.Xu  Subjective/Complaints: Bowel movement after laxative Review of Systems - Negative except mild R stump pain Objective: Vital Signs: Blood pressure 111/75, pulse 84, temperature 98.3 F (36.8 C), temperature source Oral, resp. rate 19, weight 76.5 kg (168 lb 10.4 oz), SpO2 95.00%. No results found. Results for orders placed during the hospital encounter of 02/02/14 (from the past 72 hour(s))  GLUCOSE, CAPILLARY     Status: Abnormal   Collection Time    02/03/14 11:12 AM      Result Value Ref Range   Glucose-Capillary 186 (*) 70 - 99 mg/dL   Comment 1 Notify RN    GLUCOSE, CAPILLARY     Status: Abnormal   Collection Time    02/03/14  4:56 PM      Result Value Ref Range   Glucose-Capillary 174 (*) 70 - 99 mg/dL  GLUCOSE, CAPILLARY     Status: Abnormal   Collection Time    02/03/14  9:23 PM      Result Value Ref Range   Glucose-Capillary 261 (*) 70 - 99 mg/dL  GLUCOSE, CAPILLARY     Status: Abnormal   Collection Time    02/04/14  7:50 AM      Result Value Ref Range   Glucose-Capillary 141 (*) 70 - 99 mg/dL  GLUCOSE, CAPILLARY     Status: Abnormal   Collection Time    02/04/14 11:36 AM      Result Value Ref Range   Glucose-Capillary 190 (*) 70 - 99 mg/dL   Comment 1 Notify RN    GLUCOSE, CAPILLARY     Status: Abnormal   Collection Time    02/04/14  4:51 PM      Result Value  Ref Range   Glucose-Capillary 128 (*) 70 - 99 mg/dL  GLUCOSE, CAPILLARY     Status: Abnormal   Collection Time    02/04/14 10:01 PM      Result Value Ref Range   Glucose-Capillary 237 (*) 70 - 99 mg/dL   Comment 1 Notify RN    GLUCOSE, CAPILLARY     Status: Abnormal   Collection Time    02/05/14  7:16 AM      Result Value Ref Range   Glucose-Capillary 221 (*) 70 - 99 mg/dL   Comment 1 Notify RN    GLUCOSE, CAPILLARY     Status: Abnormal   Collection Time    02/05/14 12:15 PM      Result Value Ref Range   Glucose-Capillary 174 (*) 70 - 99 mg/dL  GLUCOSE, CAPILLARY     Status: Abnormal   Collection Time    02/05/14  4:45 PM      Result Value Ref Range   Glucose-Capillary 214 (*) 70 - 99 mg/dL   Comment 1 Notify RN    GLUCOSE, CAPILLARY     Status: Abnormal   Collection Time    02/05/14 10:16 PM      Result Value Ref Range   Glucose-Capillary 310 (*) 70 - 99 mg/dL  Comment 1 Notify RN    GLUCOSE, CAPILLARY     Status: Abnormal   Collection Time    02/06/14  7:57 AM      Result Value Ref Range   Glucose-Capillary 180 (*) 70 - 99 mg/dL     HEENT: poor dentition Cardio: RRR and no murmur Resp: CTA B/L and unlabored GI: BS positive and NT,ND Extremity:  Pulses positive and No Edema Skin:   Intact and Wound ACE wrap on R , stump shrinker Left Neuro: Alert/Oriented and Abnormal Motor 4/5 B HF, 5/5 B UE Musc/Skel:  Other bilateral BKA Gen NAD   Assessment/Plan: 1. 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 Physiatrist is providing close team supervision and 24 hour management of active medical problems listed below. Physiatrist and rehab team continue to assess barriers to discharge/monitor patient progress toward functional and medical goals. FIM: FIM - Bathing Bathing Steps Patient Completed: Front perineal area;Abdomen;Left Arm;Right  Arm;Chest;Right upper leg;Left upper leg Bathing: 3: Mod-Patient completes 5-7 38f 10 parts or 50-74%  FIM - Upper Body Dressing/Undressing Upper body dressing/undressing steps patient completed: Thread/unthread right sleeve of pullover shirt/dresss;Thread/unthread left sleeve of pullover shirt/dress;Put head through opening of pull over shirt/dress;Pull shirt over trunk Upper body dressing/undressing: 5: Set-up assist to: Obtain clothing/put away FIM - Lower Body Dressing/Undressing Lower body dressing/undressing steps patient completed: Thread/unthread right pants leg;Thread/unthread left pants leg Lower body dressing/undressing: 1: Total-Patient completed less than 25% of tasks  FIM - Musician Devices: Grab bar or rail for support Toileting: 1: Total-Patient completed zero steps, helper did all 3  FIM - Radio producer Devices: Research officer, trade union Transfers: 2-From toilet/BSC: Max A (lift and lower assist);3-To toilet/BSC: Mod A (lift or lower assist)  FIM - Control and instrumentation engineer Devices: Sliding board;Arm rests Bed/Chair Transfer: 5: Supine > Sit: Supervision (verbal cues/safety issues);3: Chair or W/C > Bed: Mod A (lift or lower assist);2: Bed > Chair or W/C: Max A (lift and lower assist)  FIM - Locomotion: Wheelchair Distance: 200 Locomotion: Wheelchair: 5: Travels 150 ft or more: maneuvers on rugs and over door sills with supervision, cueing or coaxing FIM - Locomotion: Ambulation Locomotion: Ambulation: 0: Activity did not occur  Comprehension Comprehension Mode: Auditory Comprehension: 5-Follows basic conversation/direction: With no assist  Expression Expression Mode: Verbal Expression: 5-Expresses basic needs/ideas: With no assist  Social Interaction Social Interaction: 6-Interacts appropriately with others with medication or extra time (anti-anxiety, antidepressant).  Problem  Solving Problem Solving: 5-Solves basic problems: With no assist  Memory Memory: 6-More than reasonable amt of time  Medical Problem List and Plan:  1. Functional deficits secondary to Right BKA 01/30/2014 and History L BKA March 2015 and await prosthesis per [employer]  2. DVT Prophylaxis/Anticoagulation: N/A Bilat BKA  3. Pain Management:lyrica 75 mg BID, Hydrocodone,robaxin as needed  4. Acute Blood Loss anemia.Continue Niferex.Follow up CBC  5. Neuropsych: This patient is capable of making decisions on his own behalf.  6. Diabetes mellitus with peripheral neuropathy.Lattest hemoglobin A1c 8.6.Levemir 35 unit daily.  -add am levemir 10u to start 7.NICM/ICD.continue aspirin and plavix.no chest pain or shortness of breath  8.Hypertension.Coreg 12.5 mg daily.Monitor wiyh increased mobility  9.Chronic systolic CHF.Lasix 40 mg BID.Monitor for any signs of fluid overload.  10.Hyperlipidemia 10 mg daily  11.GERD.Protonix 12. Constipation adjusted laxatives  LOS (Days) 4 A FACE TO FACE  EVALUATION WAS PERFORMED  [name] [name] T 02/06/2014, 9:27 AM

## 2014-02-07 ENCOUNTER — Inpatient Hospital Stay (HOSPITAL_COMMUNITY): Payer: Medicare Other | Admitting: Physical Therapy

## 2014-02-07 ENCOUNTER — Encounter (HOSPITAL_COMMUNITY): Payer: Medicare Other

## 2014-02-07 ENCOUNTER — Inpatient Hospital Stay (HOSPITAL_COMMUNITY): Payer: Medicare Other

## 2014-02-07 LAB — GLUCOSE, CAPILLARY
GLUCOSE-CAPILLARY: 245 mg/dL — AB (ref 70–99)
GLUCOSE-CAPILLARY: 170 mg/dL — AB (ref 70–99)
GLUCOSE-CAPILLARY: 254 mg/dL — AB (ref 70–99)
GLUCOSE-CAPILLARY: 210 mg/dL — AB (ref 70–99)
Glucose-Capillary: 191 mg/dL — ABNORMAL HIGH (ref 70–99)

## 2014-02-07 MED ORDER — POLYETHYLENE GLYCOL 3350 17 G PO PACK
17.0000 g | PACK | Freq: Every day | ORAL | Status: DC
  Administered 2014-02-07 – 2014-02-14 (×8): 17 g via ORAL
  Filled 2014-02-07 (×9): qty 1

## 2014-02-07 NOTE — Progress Notes (Signed)
Occupational Therapy Session Note  Patient Details  Name: [name] [name] MRN: [id number] Date of Birth: 02/19/1946  Today's Date: 02/07/2014 Time: 2979-8921 Time Calculation (min): 56 min  Short Term Goals: Week 1:  OT Short Term Goal 1 (Week 1): Pt will transfer to drop arm BSC with max A (anterior/ posterior transfer) OT Short Term Goal 2 (Week 1): Pt will don LB clothing (pants) with mod A with lateral leans  OT Short Term Goal 3 (Week 1): Pt will perform 1/3 toileting steps on toilet with lateral leans OT Short Term Goal 4 (Week 1): Pt will perform bed mobility in prep for ADL tasks with mod A  OT Short Term Goal 5 (Week 1): Pt will maintain static sitting wEOB/EOM with min A in prep for more dynmaic sitting balance during functioanl tasks  Skilled Therapeutic Interventions/Progress Updates:  ADL-retraining with focus on improved performance with dressing (supine and sitting, using lateral leans for lower body dressing), transfers, and dynamic sitting balance.   Patient received in bed, receptive for morning ADL.  Patient required re-ed on use of bedrails/hospital bed and is able to rise to sitting at edge of bed with only setup assist to adjust DME.   Patient completed slide board transfer from EOB to w/c with setup assist and extra time and was escorted to transfer bench d/t difficulty managing w/c in bathroom.   Patient performed slide board transfer again to tub bench in walk in shower with min assist to stabilize board during transfer.  Pt proceeded with shower unassisted and used lateral leans to cleanse buttocks.  Following shower patient returned to w/c using slide board, transferred to bed from w/c w/slide board and completed dressing in bed, supine, using rolls (left > right) to don briefs and shorts, unassisted.   Patient returned to w/c using slide board this time with mod assist (d/t fatigue) and completed upper body dressing and grooming and hygiene at sink side, with only setup  assist.     Therapy Documentation Precautions:  Precautions Precautions: Fall Other Brace/Splint: prosthesis through [employer] with [name] not delivered yet Restrictions Weight Bearing Restrictions: Yes RLE Weight Bearing: Non weight bearing LLE Weight Bearing: Non weight bearing  Pain: Pain Assessment Pain Assessment: 0-10 Pain Score: 5  Pain Type: Acute pain Pain Location: Leg Pain Orientation: Right Pain Descriptors / Indicators: Throbbing Pain Onset: On-going Pain Intervention(s): Medication (See eMAR)  See FIM for current functional status  Therapy/Group: Individual Therapy  Second session: Time: 1300-1400 Time Calculation (min):  60 min  Pain Assessment: No/denies pain  Skilled Therapeutic Interventions: Therapeutic activity with focus on anterior/posterior transfer skills, dynamic sitting balance, core strengthening exercises, UE strengthening, and discharge planning.   Patient received in his w/c, completing his noon meal with his brother present.   Patient agreed to demonstrate anterior/posterior transfer in/out of bed from w/c and complete continued activities at gym to enhance/improve dynamic sitting balance.   With extra time and steadying assist to stabilize w/c, patient completed transfer to/from bed to w/c with min vc for technique and safety.   Patient was then escorted to gym and completed 20 min of activity while sitting at edge of raised mat, to include unsupported UE strengthening, weight-shifting (anterior, posterior, lateral), and forward reaching using weights. Patient then completed unilateral arm strengthening using thera-band and returned to his room at end of session with call light and phone placed within reach.   See FIM for current functional status  Therapy/Group: Individual Therapy  Salome Spotted 02/07/2014,  9:27 AM

## 2014-02-07 NOTE — Patient Care Conference (Signed)
Inpatient RehabilitationTeam Conference and Plan of Care Update Date: 02/07/2014   Time: 2:05 PM    Patient Name: [name] [name]: [id number]  Date of Birth: 08/21/1946 Sex: Male         Room/Bed: 4W04C/4W04C-01 Payor Info: Payor: [employer] MEDICARE / Plan: AARP MEDICARE COMPLETE / Product Type: *No Product type* /    Admitting Diagnosis: BKA  Admit Date/Time:  02/02/2014  6:24 PM Admission Comments: No comment available   Primary Diagnosis:  <principal problem not specified> Principal Problem: <principal problem not specified>  Patient Active Problem List   Diagnosis Date Noted  . S/P bilateral BKA (below knee amputation) 02/02/2014  . Osteomyelitis of right foot 01/30/2014  . 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 01/04/2014  . Osteomyelitis of toe of right foot 01/04/2014  . S/P Lt BKA 11/09/13 11/14/2013  . Acute blood loss anemia 11/11/2013  . Type II or unspecified type diabetes mellitus 11/09/2013  . Lower limb amputation, great toe 10/11/2013  . Lower limb amputation, other toe(s) 10/11/2013  . Chronic osteomyelitis of toe of left foot 10/04/2013  . Foot osteomyelitis, left 10/04/2013  . Uncontrolled pain, Lt toe 09/21/2013  . Gangrenous toe, Lt toe 09/21/2013  . PAD (peripheral artery disease) 09/13/2013  . Diabetes mellitus 09/13/2013  . Hyperlipidemia 09/13/2013  . PAF (paroxysmal atrial fibrillation) 09/13/2013  . Critical lower limb ischemia- s/p Rt anterior tibial PTA 12/29/13 in preparation for Rt BKA 08/30/2013  . 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 HTN (hypertension) 04/08/2011  . NICM- EF 20-25% echo 6/14 09/29/2008  . LBBB 09/29/2008  . SYSTOLIC HEART FAILURE, CHRONIC 09/29/2008    Expected Discharge Date: Expected Discharge Date: 02/10/14  Team Members Present: Physician leading conference: Dr. Alger Simons Social Worker Present: Lennart Pall, LCSW Nurse Present: Elliot Cousin, RN PT  Present: [name], PT OT Present: Blanchard Mane, OT;Jennifer Tamala Julian, OT;Patricia Lissa Hoard, OT SLP Present: Weston Anna, SLP PPS Coordinator present : Daiva Nakayama, RN, CRRN;Becky Alwyn Ren, PT     Current Status/Progress Goal Weekly Team Focus  Medical   bilaeral bka's. left old, right new, pain issues  improve activity tolerance  pain mgt, stump care.   Bowel/Bladder   Continent of bowel and bladder, Miralax 17g daily, Senna Docusate 2tab daily.  Remain free of infection,skin breakdown and constipation.  Assess routine bowel movements, urinary output   Swallow/Nutrition/ Hydration             ADL's   Min-Mod A with transfer, Supervision and setup with ADL  overall supervision  dynamic sitting balance, general strengthening (UE), compensatory ADL (lateral leans), transfer (slide board, A/P)   Mobility   Supervision w/c mobility, min A basic transfers  Mod I w/c mobility, supervision basic transfers, min A car transfer  ROM and strengthening, transfer training   Communication             Safety/Cognition/ Behavioral Observations            Pain   HYDROcodone-acetaminophen 1-2 tablet q4h, Acetaminophen 325-650 q6h prn, pain scale 0 on scale 0-10  Pain scale <4  Assess pt scale q shift.   Skin   Bilateral BKA, Right BKA gauze  and ace bandage.  Remain free of infection and skin breakdown.  Assess Right BKA, and skin Qshift.    Rehab Goals Patient on target to meet rehab goals: Yes *See Care Plan and progress notes for long  and short-term goals.  Barriers to Discharge: never learned to walk on left BK prosthesis    Possible Resolutions to Barriers:  sliding board txfer, family education    Discharge Planning/Teaching Needs:  Home with wife to provide 24/7 assistance      Team Discussion:  Team reports pt interested in going ahead and getting prosthesis (which is ready) brought to hospital and begin training, however, MD and PT feel best to wait for training at [facility] as OP given short LOS.  Still requiring disimpaction - adjusting meds.  No concerns about reaching supervision w/c level goals by end of week.  Revisions to Treatment Plan:  None   Continued Need for Acute Rehabilitation Level of Care: The patient requires daily medical management by a physician with specialized training in physical medicine and rehabilitation for the following conditions: Daily direction of a multidisciplinary physical rehabilitation program to ensure safe treatment while eliciting the highest outcome that is of practical value to the patient.: Yes Daily medical management of patient stability for increased activity during participation in an intensive rehabilitation regime.: Yes Daily analysis of laboratory values and/or radiology reports with any subsequent need for medication adjustment of medical intervention for : Post surgical problems;Pulmonary problems  Jahir Halt 02/07/2014, 6:12 PM

## 2014-02-07 NOTE — Progress Notes (Signed)
Physical Therapy Session Note  Patient Details  Name: [name] [name] MRN: [id number] Date of Birth: 1945/09/17  Today's Date: 02/07/2014 HALP:[id number] and  [id number] Time Calculation (min): 23 min and 45 min  Short Term Goals: Week 1:  PT Short Term Goal 1 (Week 1): STG = LTG due to ELOS  Skilled Therapeutic Interventions/Progress Updates:   First session: Pt in w/c finishing up dressing.  Transported to gym in w/c to practice transfers.  Pt educated and demonstrated to patient how to perform anterior/posterior transfer and situations where this type of transfer would be beneficial.  Pt returned demonstrated transfer mat <> w/c with min A to assist with lifting RLE off of mat secondary to pain and verbal cues for sequencing.  While on mat pt performed exercises for bilat hamstring ROM, ADDuctor ROM, and lateral trunk/quadratus lumborum stretches bilaterally.  Pt performed posterior transfer back to w/c and returned to room to rest.  Second session: Upon arrival pt on [facility] with nursing following disimpaction.  Pt fatigued but willing to participate.  Performed lateral leans x 2 each side to pull up brief and shorts with assistance.  Performed slideboard BSC > w/c with min A.  Performed w/c mobility x 100' in controlled environment with supervision but with extra time. Pt fatigued and required total A to finish transporting to gym.  Performed anterior transfer to mat with supervision-min A with intermittent verbal and visual cues to locate leg rest release and sequence w/c parts management.  On mat pt reclined on pillows and performed bilat UE theraband resisted exercises for scap retraction and LE extension/shoulder depression strengthening and bilat LE hip ABD and glute/hamstring strengthening with belt around bilat thighs with isometric ABD with bridges (bilat knees over bolster) x 10 reps each exercise.  Returned to sitting with mod A and performed posterior transfer back to w/c min A and  returned to room to rest before OT.      Therapy Documentation Precautions:  Precautions Precautions: Fall Other Brace/Splint: prosthesis through [employer] with [name] not delivered yet Restrictions Weight Bearing Restrictions: Yes RLE Weight Bearing: Non weight bearing LLE Weight Bearing: Non weight bearing Pain: Pain Assessment Pain Assessment: 0-10 Pain Score: 3  Pain Type: Acute pain Pain Location: Leg Pain Orientation: Right Pain Descriptors / Indicators: Throbbing Pain Onset: On-going Pain Intervention(s): Medication (See eMAR) Locomotion : Wheelchair Mobility Distance: 150   See FIM for current functional status  Therapy/Group: Individual Therapy  [name] [facility] 02/07/2014, 12:58 PM

## 2014-02-07 NOTE — Plan of Care (Signed)
Problem: RH BOWEL ELIMINATION Goal: RH STG MANAGE BOWEL WITH ASSISTANCE STG Manage Bowel with mod Assistance.  Outcome: Not Progressing Patient required disimpaction. May need additional stool softners

## 2014-02-07 NOTE — Progress Notes (Signed)
68 y.o. right-handed male with history of nonischemic cardiomyopathy with ICD, hypertension, chronic systolic congestive heart failure, PAF, diabetes mellitus with peripheral neuropathy and left BKA secondary to osteomyelitis received inpatient rehabilitation services in March of 2015 and currently awaiting prosthesis. Patient lives with his wife primarily using a wheelchair prior to admission and a walker for stand pivot transfers. Admitted 01/30/2014 with nonhealing right foot ulcers and findings of osteomyelitis. No relief with conservative care. Underwent right transtibial amputation 01/30/2014 per Dr.Xu  Subjective/Complaints: Asked about potentially getting his prosthesis while here. Pain under control  Review of Systems -  Objective: Vital Signs: Blood pressure 127/84, pulse 91, temperature 97.9 F (36.6 C), temperature source Oral, resp. rate 19, weight 76.5 kg (168 lb 10.4 oz), SpO2 96.00%. No results found. Results for orders placed during the hospital encounter of 02/02/14 (from the past 72 hour(s))  GLUCOSE, CAPILLARY     Status: Abnormal   Collection Time    02/04/14 11:36 AM      Result Value Ref Range   Glucose-Capillary 190 (*) 70 - 99 mg/dL   Comment 1 Notify RN    GLUCOSE, CAPILLARY     Status: Abnormal   Collection Time    02/04/14  4:51 PM      Result Value Ref Range   Glucose-Capillary 128 (*) 70 - 99 mg/dL  GLUCOSE, CAPILLARY     Status: Abnormal   Collection Time    02/04/14 10:01 PM      Result Value Ref Range   Glucose-Capillary 237 (*) 70 - 99 mg/dL   Comment 1 Notify RN    GLUCOSE, CAPILLARY     Status: Abnormal   Collection Time    02/05/14  7:16 AM      Result Value Ref Range   Glucose-Capillary 221 (*) 70 - 99 mg/dL   Comment 1 Notify RN    GLUCOSE, CAPILLARY     Status: Abnormal   Collection Time    02/05/14 12:15 PM      Result Value Ref Range   Glucose-Capillary 174 (*) 70 - 99 mg/dL  GLUCOSE, CAPILLARY     Status: Abnormal   Collection Time     02/05/14  4:45 PM      Result Value Ref Range   Glucose-Capillary 214 (*) 70 - 99 mg/dL   Comment 1 Notify RN    GLUCOSE, CAPILLARY     Status: Abnormal   Collection Time    02/05/14 10:16 PM      Result Value Ref Range   Glucose-Capillary 310 (*) 70 - 99 mg/dL   Comment 1 Notify RN    GLUCOSE, CAPILLARY     Status: Abnormal   Collection Time    02/06/14  7:57 AM      Result Value Ref Range   Glucose-Capillary 180 (*) 70 - 99 mg/dL  GLUCOSE, CAPILLARY     Status: Abnormal   Collection Time    02/06/14 11:26 AM      Result Value Ref Range   Glucose-Capillary 192 (*) 70 - 99 mg/dL  GLUCOSE, CAPILLARY     Status: Abnormal   Collection Time    02/06/14  4:25 PM      Result Value Ref Range   Glucose-Capillary 235 (*) 70 - 99 mg/dL  GLUCOSE, CAPILLARY     Status: Abnormal   Collection Time    02/06/14 10:12 PM      Result Value Ref Range   Glucose-Capillary 245 (*) 70 - 99  mg/dL  GLUCOSE, CAPILLARY     Status: Abnormal   Collection Time    02/07/14  7:34 AM      Result Value Ref Range   Glucose-Capillary 170 (*) 70 - 99 mg/dL     HEENT: poor dentition Cardio: RRR and no murmur Resp: CTA B/L and unlabored GI: BS positive and NT,ND Extremity:  Pulses positive and No Edema Skin:   Intact and Wound ACE wrap on R , stump shrinker Left Neuro: Alert/Oriented and Abnormal Motor 4/5 B HF, 5/5 B UE Musc/Skel:  Other bilateral BKA Gen NAD   Assessment/Plan: 1. 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 Physiatrist is providing close team supervision and 24 hour management of active medical problems listed below. Physiatrist and rehab team continue to assess barriers to discharge/monitor patient progress toward functional and medical goals. FIM: FIM - Bathing Bathing Steps Patient Completed: Chest;Right Arm;Left Arm;Abdomen;Front perineal area;Buttocks;Right  upper leg;Left upper leg Bathing: 5: Set-up assist to: Obtain items  FIM - Upper Body Dressing/Undressing Upper body dressing/undressing steps patient completed: Thread/unthread right sleeve of pullover shirt/dresss;Thread/unthread left sleeve of pullover shirt/dress;Put head through opening of pull over shirt/dress;Pull shirt over trunk Upper body dressing/undressing: 5: Set-up assist to: Obtain clothing/put away FIM - Lower Body Dressing/Undressing Lower body dressing/undressing steps patient completed: Thread/unthread right underwear leg;Thread/unthread left underwear leg;Pull underwear up/down;Thread/unthread right pants leg;Thread/unthread left pants leg;Pull pants up/down Lower body dressing/undressing: 4: Min-Patient completed 75 plus % of tasks  FIM - Musician Devices: Grab bar or rail for support Toileting: 1: Total-Patient completed zero steps, helper did all 3  FIM - Radio producer Devices: Research officer, trade union Transfers: 2-From toilet/BSC: Max A (lift and lower assist);3-To toilet/BSC: Mod A (lift or lower assist)  FIM - Bed/Chair Transfer Bed/Chair Transfer Assistive Devices: HOB elevated Bed/Chair Transfer: 3: Supine > Sit: Mod A (lifting assist/Pt. 50-74%/lift 2 legs  FIM - Locomotion: Wheelchair Distance: 150 Locomotion: Wheelchair: 5: Travels 150 ft or more: maneuvers on rugs and over door sills with supervision, cueing or coaxing FIM - Locomotion: Ambulation Locomotion: Ambulation: 0: Activity did not occur  Comprehension Comprehension Mode: Auditory Comprehension: 5-Follows basic conversation/direction: With no assist  Expression Expression Mode: Verbal Expression: 5-Expresses basic 90% of the time/requires cueing < 10% of the time.  Social Interaction Social Interaction: 6-Interacts appropriately with others with medication or extra time (anti-anxiety, antidepressant).  Problem  Solving Problem Solving: 5-Solves basic 90% of the time/requires cueing < 10% of the time  Memory Memory: 6-More than reasonable amt of time  Medical Problem List and Plan:  1. Functional deficits secondary to Right BKA 01/30/2014 and History L BKA March 2015 and await prosthesis per [employer]   -don't how much utility prosthesis will have at this point. Will discuss with PT--if we need to have [employer] deliver, we can 2. DVT Prophylaxis/Anticoagulation: N/A Bilat BKA  3. Pain Management:lyrica 75 mg BID, Hydrocodone,robaxin as needed  4. Acute Blood Loss anemia.Continue Niferex.Follow up CBC  5. Neuropsych: This patient is capable of making decisions on his own behalf.  6. Diabetes mellitus with peripheral neuropathy.Lattest hemoglobin A1c 8.6.Levemir 35 unit daily.  -added am levemir 10u---observe for pattern today 7.NICM/ICD.continue aspirin and plavix.no chest pain or shortness of breath  8.Hypertension.Coreg 12.5 mg daily.Monitor wiyh increased mobility  9.Chronic systolic CHF.Lasix 40 mg BID.Monitor for any signs of fluid overload.  10.Hyperlipidemia 10 mg daily  11.GERD.Protonix 12. Constipation adjusted laxatives  LOS (Days) 5 A FACE TO FACE EVALUATION WAS PERFORMED  SWARTZ,ZACHARY T 02/07/2014, 9:13 AM

## 2014-02-07 NOTE — Progress Notes (Signed)
Physical Therapy Session Note  Patient Details  Name: [name] [name] MRN: [id number] Date of Birth: February 11, 1946  Today's Date: 02/07/2014 Time: 0800   Short Term Goals: Week 1:  PT Short Term Goal 1 (Week 1): STG = LTG due to ELOS  Skilled Therapeutic Interventions/Progress Updates:   Pt received sitting up in bed, breakfast tray just arrived and RN arriving for medication administration. Upon returning to room 15 min later, patient still eating breakfast with RN in room. Pt missed 30 min of skilled therapy. Will f/u per POC.    Therapy Documentation Precautions:  Precautions Precautions: Fall Other Brace/Splint: prosthesis through [employer] with [name] not delivered yet Restrictions Weight Bearing Restrictions: Yes RLE Weight Bearing: Non weight bearing LLE Weight Bearing: Non weight bearing General: Amount of Missed PT Time (min): 30 Minutes Missed Time Reason: Nursing care;Other (comment) (Pt eating breakfast, RN medication administration)  See FIM for current functional status  Therapy/Group: Individual Therapy  Laretta Alstrom 02/07/2014, 9:05 AM

## 2014-02-08 ENCOUNTER — Inpatient Hospital Stay (HOSPITAL_COMMUNITY): Payer: Medicare Other | Admitting: Physical Therapy

## 2014-02-08 ENCOUNTER — Encounter (HOSPITAL_COMMUNITY): Payer: Medicare Other

## 2014-02-08 ENCOUNTER — Inpatient Hospital Stay (HOSPITAL_COMMUNITY): Payer: Medicare Other

## 2014-02-08 DIAGNOSIS — I739 Peripheral vascular disease, unspecified: Secondary | ICD-10-CM

## 2014-02-08 DIAGNOSIS — L98499 Non-pressure chronic ulcer of skin of other sites with unspecified severity: Secondary | ICD-10-CM

## 2014-02-08 DIAGNOSIS — S88119A Complete traumatic amputation at level between knee and ankle, unspecified lower leg, initial encounter: Secondary | ICD-10-CM

## 2014-02-08 LAB — GLUCOSE, CAPILLARY
GLUCOSE-CAPILLARY: 215 mg/dL — AB (ref 70–99)
Glucose-Capillary: 104 mg/dL — ABNORMAL HIGH (ref 70–99)
Glucose-Capillary: 269 mg/dL — ABNORMAL HIGH (ref 70–99)
Glucose-Capillary: 254 mg/dL — ABNORMAL HIGH (ref 70–99)

## 2014-02-08 MED ORDER — INSULIN DETEMIR 100 UNIT/ML ~~LOC~~ SOLN
15.0000 [IU] | Freq: Every day | SUBCUTANEOUS | Status: DC
  Administered 2014-02-09 – 2014-02-11 (×3): 15 [IU] via SUBCUTANEOUS
  Filled 2014-02-08 (×3): qty 0.15

## 2014-02-08 NOTE — Progress Notes (Signed)
Inpatient Diabetes Program Recommendations  AACE/ADA: New Consensus Statement on Inpatient Glycemic Control (2013)  Target Ranges:  Prepandial:   less than 140 mg/dL      Peak postprandial:   less than 180 mg/dL (1-2 hours)      Critically ill patients:  140 - 180 mg/dL   Reason for Visit: Hyperglycemia Results for DESMUND, ELMAN (MRN [id number]) as of 02/08/2014 12:21  Ref. Range 02/07/2014 12:11 02/07/2014 17:01 02/07/2014 20:48 02/08/2014 07:38 02/08/2014 12:03  Glucose-Capillary Latest Range: 70-99 mg/dL 191 (H) 254 (H) 210 (H) 104 (H) 269 (H)   FBS looks good. Post-prandial blood sugars high.  Recommendations: Decrease am Lantus to 10 units QAM. Add Novolog 3 units tidwc for meal coverage insulin if pt eats >50% meal.  Thank you. Lorenda Peck, RD, LDN, CDE Inpatient Diabetes Coordinator [phone]

## 2014-02-08 NOTE — Progress Notes (Signed)
68 y.o. right-handed male with history of nonischemic cardiomyopathy with ICD, hypertension, chronic systolic congestive heart failure, PAF, diabetes mellitus with peripheral neuropathy and left BKA secondary to osteomyelitis received inpatient rehabilitation services in March of 2015 and currently awaiting prosthesis. Patient lives with his wife primarily using a wheelchair prior to admission and a walker for stand pivot transfers. Admitted 01/30/2014 with nonhealing right foot ulcers and findings of osteomyelitis. No relief with conservative care. Underwent right transtibial amputation 01/30/2014 per Dr.Xu  Subjective/Complaints: No new complaints. Feeling fairly well. Pain controlled  Review of Systems -  Objective: Vital Signs: Blood pressure 100/67, pulse 78, temperature 98 F (36.7 C), temperature source Oral, resp. rate 18, weight 76.5 kg (168 lb 10.4 oz), SpO2 99.00%. No results found. Results for orders placed during the hospital encounter of 02/02/14 (from the past 72 hour(s))  GLUCOSE, CAPILLARY     Status: Abnormal   Collection Time    02/05/14 12:15 PM      Result Value Ref Range   Glucose-Capillary 174 (*) 70 - 99 mg/dL  GLUCOSE, CAPILLARY     Status: Abnormal   Collection Time    02/05/14  4:45 PM      Result Value Ref Range   Glucose-Capillary 214 (*) 70 - 99 mg/dL   Comment 1 Notify RN    GLUCOSE, CAPILLARY     Status: Abnormal   Collection Time    02/05/14 10:16 PM      Result Value Ref Range   Glucose-Capillary 310 (*) 70 - 99 mg/dL   Comment 1 Notify RN    GLUCOSE, CAPILLARY     Status: Abnormal   Collection Time    02/06/14  7:57 AM      Result Value Ref Range   Glucose-Capillary 180 (*) 70 - 99 mg/dL  GLUCOSE, CAPILLARY     Status: Abnormal   Collection Time    02/06/14 11:26 AM      Result Value Ref Range   Glucose-Capillary 192 (*) 70 - 99 mg/dL  GLUCOSE, CAPILLARY     Status: Abnormal   Collection Time    02/06/14  4:25 PM      Result Value Ref Range    Glucose-Capillary 235 (*) 70 - 99 mg/dL  GLUCOSE, CAPILLARY     Status: Abnormal   Collection Time    02/06/14 10:12 PM      Result Value Ref Range   Glucose-Capillary 245 (*) 70 - 99 mg/dL  GLUCOSE, CAPILLARY     Status: Abnormal   Collection Time    02/07/14  7:34 AM      Result Value Ref Range   Glucose-Capillary 170 (*) 70 - 99 mg/dL  GLUCOSE, CAPILLARY     Status: Abnormal   Collection Time    02/07/14 12:11 PM      Result Value Ref Range   Glucose-Capillary 191 (*) 70 - 99 mg/dL  GLUCOSE, CAPILLARY     Status: Abnormal   Collection Time    02/07/14  5:01 PM      Result Value Ref Range   Glucose-Capillary 254 (*) 70 - 99 mg/dL  GLUCOSE, CAPILLARY     Status: Abnormal   Collection Time    02/07/14  8:48 PM      Result Value Ref Range   Glucose-Capillary 210 (*) 70 - 99 mg/dL  GLUCOSE, CAPILLARY     Status: Abnormal   Collection Time    02/08/14  7:38 AM  Result Value Ref Range   Glucose-Capillary 104 (*) 70 - 99 mg/dL     HEENT: poor dentition Cardio: RRR and no murmur Resp: CTA B/L and unlabored GI: BS positive and NT,ND Extremity:  Pulses positive and No Edema Skin:   Intact and Wound with minimal drainage (AK). Appropriately tender Neuro: Alert/Oriented and Abnormal Motor 4/5 B HF, 5/5 B UE Musc/Skel:  Other bilateral BKA Gen NAD   Assessment/Plan: 1. 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 Physiatrist is providing close team supervision and 24 hour management of active medical problems listed below. Physiatrist and rehab team continue to assess barriers to discharge/monitor patient progress toward functional and medical goals. FIM: FIM - Bathing Bathing Steps Patient Completed: Chest;Right Arm;Left Arm;Abdomen;Front perineal area;Buttocks;Right upper leg;Left upper leg Bathing: 5: Supervision: Safety issues/verbal cues  FIM -  Upper Body Dressing/Undressing Upper body dressing/undressing steps patient completed: Thread/unthread right sleeve of pullover shirt/dresss;Thread/unthread left sleeve of pullover shirt/dress;Put head through opening of pull over shirt/dress;Pull shirt over trunk Upper body dressing/undressing: 5: Set-up assist to: Obtain clothing/put away FIM - Lower Body Dressing/Undressing Lower body dressing/undressing steps patient completed: Thread/unthread right underwear leg;Thread/unthread left underwear leg;Pull underwear up/down;Thread/unthread right pants leg;Thread/unthread left pants leg;Pull pants up/down;Fasten/unfasten pants Lower body dressing/undressing: 5: Set-up assist to: Obtain clothing  FIM - Musician Devices: Grab bar or rail for support Toileting: 1: Total-Patient completed zero steps, helper did all 3  FIM - Radio producer Devices: Research officer, trade union Transfers: 4-To toilet/BSC: Min A (steadying Pt. > 75%);4-From toilet/BSC: Min A (steadying Pt. > 75%)  FIM - Bed/Chair Transfer Bed/Chair Transfer Assistive Devices: Arm rests;Sliding board Bed/Chair Transfer: 3: Supine > Sit: Mod A (lifting assist/Pt. 50-74%/lift 2 legs;4: Sit > Supine: Min A (steadying pt. > 75%/lift 1 leg);4: Bed > Chair or W/C: Min A (steadying Pt. > 75%);4: Chair or W/C > Bed: Min A (steadying Pt. > 75%)  FIM - Locomotion: Wheelchair Distance: 150 Locomotion: Wheelchair: 5: Travels 150 ft or more: maneuvers on rugs and over door sills with supervision, cueing or coaxing FIM - Locomotion: Ambulation Locomotion: Ambulation: 0: Activity did not occur  Comprehension Comprehension Mode: Auditory Comprehension: 5-Follows basic conversation/direction: With no assist  Expression Expression Mode: Verbal Expression: 5-Expresses basic needs/ideas: With no assist  Social Interaction Social Interaction: 6-Interacts appropriately with others with  medication or extra time (anti-anxiety, antidepressant).  Problem Solving Problem Solving: 5-Solves basic 90% of the time/requires cueing < 10% of the time  Memory Memory: 6-More than reasonable amt of time  Medical Problem List and Plan:  1. Functional deficits secondary to Right BKA 01/30/2014 and History L BKA March 2015 and await prosthesis per [employer]   -will hold on prosthetic delivery at this time 2. DVT Prophylaxis/Anticoagulation: N/A Bilat BKA  3. Pain Management:lyrica 75 mg BID, Hydrocodone,robaxin as needed  4. Acute Blood Loss anemia.Continue Niferex.Follow up CBC  5. Neuropsych: This patient is capable of making decisions on his own behalf.  6. Diabetes mellitus with peripheral neuropathy.Lattest hemoglobin A1c 8.6.Levemir 35 unit daily.  -added am levemir 10u, increase to 15u today 7.NICM/ICD.continue aspirin and plavix.no chest pain or shortness of breath  8.Hypertension.Coreg 12.5 mg daily.Monitor wiyh increased mobility  9.Chronic systolic CHF.Lasix 40 mg BID.Monitor for any signs of fluid overload.  10.Hyperlipidemia 10 mg daily  11.GERD.Protonix 12. Constipation adjusted laxatives  LOS (Days) 6 A  FACE TO FACE EVALUATION WAS PERFORMED  SWARTZ,ZACHARY T 02/08/2014, 8:13 AM

## 2014-02-08 NOTE — Progress Notes (Signed)
Occupational Therapy Session Note  Patient Details  Name: [name] [name] MRN: [id number] Date of Birth: 07-02-1946  Today's Date: 02/08/2014 Time: 4967-5916 Time Calculation (min): 55 min  Short Term Goals: Week 1:  OT Short Term Goal 1 (Week 1): Pt will transfer to drop arm BSC with max A (anterior/ posterior transfer) OT Short Term Goal 2 (Week 1): Pt will don LB clothing (pants) with mod A with lateral leans  OT Short Term Goal 3 (Week 1): Pt will perform 1/3 toileting steps on toilet with lateral leans OT Short Term Goal 4 (Week 1): Pt will perform bed mobility in prep for ADL tasks with mod A  OT Short Term Goal 5 (Week 1): Pt will maintain static sitting wEOB/EOM with min A in prep for more dynmaic sitting balance during functioanl tasks  Skilled Therapeutic Interventions/Progress Updates: ADL-retraining with focus on improved bed mobility (supine to sidelying), dynamic sitting balance, anterior/posterior transfer, slide board, adapted bathing/dressing (supine/sitting at EOB).   Patient received supine in bed and affirming plan to not purchase tub bench.   With setup assist and extra time, patient completed bathing in bed, both supine (lower body) and sitting at edge of bed.  Patient requires min verbal cues to problem solve and min facilitation to complete efficient rolls (right to left) to hike up brief and pants over hips.  Patient completed AP transfer to w/c with setup and supervision and was challenged to perform similar transfer to [facility].   After assist with problem-solving patient realized he would be unable to complete AP transfer to [facility] safely and then proceeded through slide board transfer with only min assist to steady BSC.     Therapy Documentation Precautions:  Precautions Precautions: Fall Other Brace/Splint: prosthesis through [employer] with [name] not delivered yet Restrictions Weight Bearing Restrictions: Yes RLE Weight Bearing: Non weight bearing LLE Weight Bearing:  Non weight bearing  Pain: Pain Assessment Pain Score: 0-No pain ADL:   Exercises:   Other Treatments:    See FIM for current functional status  Therapy/Group: Individual Therapy  Second session: Time: 1430-1500 Time Calculation (min):  30 min  Pain Assessment: No/denies pain  Skilled Therapeutic Interventions: ADL-retraining with focus on dynamic sitting balance, attention, and discharge planning.   Patient requested grooming (shaving) and completed task sitting in w/c at sink side while discussing change of discharge plans from home to SNF for continued rehab.   Patient acknowledged that his need for assistance with mobility and balance during transfers presented risk for injury to himself and/or his wife.   Patient was realistic in self-assessment and attentive to grooming throughout session, with only setup assist required to provide supplies and replace blade on safety razor.   Patient able to reach all supplies from w/c level without evidence of LOB.  See FIM for current functional status  Therapy/Group: Individual Therapy  Honey Zakarian 02/08/2014, 9:25 AM

## 2014-02-08 NOTE — Progress Notes (Signed)
I have reviewed and I agree with the following eval/treatment note.  Audra Hall, PT, DPT  

## 2014-02-08 NOTE — Progress Notes (Signed)
Physical Therapy Note  Patient Details  Name: [name] [name] MRN: [id number] Date of Birth: 10-Dec-1945 Today's Date: 02/08/2014  Time: 9:30 - 10:15 45 minutes  1:1, no c/o pain.  Session focused on functional transfers, w/c mobility, dynamic sitting balance, and core strengthening.  Pt received sitting in w/c after session with OT.  Pt propelled w/c 150' with supervision.  A/P transfer w/c <> mat, cueing for leg rests and sequencing, supervision for w/c > mat, min/mod A for mat > w/c due to fatigue.  Scooting and bed mobility with supervision for sitting balance.  Multiple sit <> supine with supervision, required min A at end of session due to core fatigue. 2 x 10 bilat SLR, reclined on pillows, focus on core control.  3 x 1 min sitting balance and core control, rhythmic stabilization and perturbations with no UE support, pt required cueing to right posture, no LOB.  2 x 1 min dynamic sitting balance ball toss, supervision for balance, cueing for posture, no LOB.  [name] [name] 02/08/2014, 12:14 PM

## 2014-02-08 NOTE — Progress Notes (Signed)
Social Work Patient ID: [name] [name], male   DOB: 26-Dec-1945, 67 y.o.   MRN: [id number]  Met with pt and wife today to review team conference.  Both expressing much concern about targeted d/c of 6/19.  Wife does not feel she can meet his care needs at home.  "I was struggling when he still had a one leg and now he don't have any."  Discussed option of SNF and both quickly agreeing to this plan.  Will send out FL2 and will keep pt/ family and staff posted on bed offers.  HOYLE, LUCY, LCSW

## 2014-02-08 NOTE — Progress Notes (Signed)
Physical Therapy Session Note  Patient Details  Name: [name] [name] MRN: [id number] Date of Birth: 1946-03-25  Today's Date: 02/08/2014 Time: 1100-1200 Time Calculation (min): 60 min  Short Term Goals: Week 1:  PT Short Term Goal 1 (Week 1): STG = LTG due to ELOS  Skilled Therapeutic Interventions/Progress Updates:   Pt participated in therapeutic exercise group with focus on: multiple transfers w/c <> mat anterior/posterior for sequencing and UE strength; bilat LE and core ROM and strengthening exercises in supine and sitting and dynamic sitting balance/trunk control exercises in sitting with medicine ball.     Therapy Documentation Precautions:  Precautions Precautions: Fall Other Brace/Splint: prosthesis through [employer] with [name] not delivered yet Restrictions Weight Bearing Restrictions: Yes RLE Weight Bearing: Non weight bearing LLE Weight Bearing: Non weight bearing Pain: Pain Assessment Pain Score: 0-No pain  See FIM for current functional status  Therapy/Group: Group Therapy  [name] Faucette 02/08/2014, 12:19 PM

## 2014-02-09 ENCOUNTER — Encounter (HOSPITAL_COMMUNITY): Payer: Medicare Other | Admitting: Occupational Therapy

## 2014-02-09 ENCOUNTER — Inpatient Hospital Stay (HOSPITAL_COMMUNITY): Payer: Medicare Other

## 2014-02-09 ENCOUNTER — Inpatient Hospital Stay (HOSPITAL_COMMUNITY): Payer: Medicare Other | Admitting: Occupational Therapy

## 2014-02-09 ENCOUNTER — Inpatient Hospital Stay (HOSPITAL_COMMUNITY): Payer: Medicare Other | Admitting: Physical Therapy

## 2014-02-09 LAB — GLUCOSE, CAPILLARY
GLUCOSE-CAPILLARY: 241 mg/dL — AB (ref 70–99)
Glucose-Capillary: 130 mg/dL — ABNORMAL HIGH (ref 70–99)
Glucose-Capillary: 84 mg/dL (ref 70–99)
Glucose-Capillary: 207 mg/dL — ABNORMAL HIGH (ref 70–99)

## 2014-02-09 NOTE — Progress Notes (Signed)
Reviewed and in agreement with treatment provided.  

## 2014-02-09 NOTE — Progress Notes (Signed)
Physical Therapy Session Note  Patient Details  Name: [name] [name] MRN: [id number] Date of Birth: 1945-10-13  Today's Date: 02/09/2014 Time: 1115-5208 Time Calculation (min): 45 min  Short Term Goals: Week 1:  PT Short Term Goal 1 (Week 1): STG = LTG due to ELOS  Skilled Therapeutic Interventions/Progress Updates:   Pt received sitting in w/c, ready for therapy. Session focused on transfers, activity tolerance, and LE strengthening/ROM. W/c propulsion room <> gym using BUEs 200 ft x 2 with supervision. Pt performed AP transfer mat <> w/c with close S, vc's for leg rest management. Pt performed LE therex for strengthening and ROM 2 x 10 each exercise: supine bridging with bolster, RLE SLR, sidelying R hip flex/ext, sidelying R hip abduction, B supine hip flexor stretch x 60 sec each. Pt returned to room and left sitting in w/c with all needs within reach.   Therapy Documentation Precautions:  Precautions Precautions: Fall Other Brace/Splint: prosthesis through [employer] with [name] not delivered yet Restrictions Weight Bearing Restrictions: Yes RLE Weight Bearing: Non weight bearing LLE Weight Bearing: Non weight bearing Pain:  5/10  See FIM for current functional status  Therapy/Group: Individual Therapy  Laretta Alstrom 02/09/2014, 5:43 PM

## 2014-02-09 NOTE — Progress Notes (Signed)
68 y.o. right-handed male with history of nonischemic cardiomyopathy with ICD, hypertension, chronic systolic congestive heart failure, PAF, diabetes mellitus with peripheral neuropathy and left BKA secondary to osteomyelitis received inpatient rehabilitation services in March of 2015 and currently awaiting prosthesis. Patient lives with his wife primarily using a wheelchair prior to admission and a walker for stand pivot transfers. Admitted 01/30/2014 with nonhealing right foot ulcers and findings of osteomyelitis. No relief with conservative care. Underwent right transtibial amputation 01/30/2014 per Dr.Xu  Subjective/Complaints: No new problems.  Review of Systems -  Objective: Vital Signs: Blood pressure 108/72, pulse 87, temperature 98 F (36.7 C), temperature source Oral, resp. rate 18, weight 76.5 kg (168 lb 10.4 oz), SpO2 100.00%. No results found. Results for orders placed during the hospital encounter of 02/02/14 (from the past 72 hour(s))  GLUCOSE, CAPILLARY     Status: Abnormal   Collection Time    02/06/14 11:26 AM      Result Value Ref Range   Glucose-Capillary 192 (*) 70 - 99 mg/dL  GLUCOSE, CAPILLARY     Status: Abnormal   Collection Time    02/06/14  4:25 PM      Result Value Ref Range   Glucose-Capillary 235 (*) 70 - 99 mg/dL  GLUCOSE, CAPILLARY     Status: Abnormal   Collection Time    02/06/14 10:12 PM      Result Value Ref Range   Glucose-Capillary 245 (*) 70 - 99 mg/dL  GLUCOSE, CAPILLARY     Status: Abnormal   Collection Time    02/07/14  7:34 AM      Result Value Ref Range   Glucose-Capillary 170 (*) 70 - 99 mg/dL  GLUCOSE, CAPILLARY     Status: Abnormal   Collection Time    02/07/14 12:11 PM      Result Value Ref Range   Glucose-Capillary 191 (*) 70 - 99 mg/dL  GLUCOSE, CAPILLARY     Status: Abnormal   Collection Time    02/07/14  5:01 PM      Result Value Ref Range   Glucose-Capillary 254 (*) 70 - 99 mg/dL  GLUCOSE, CAPILLARY     Status: Abnormal   Collection Time    02/07/14  8:48 PM      Result Value Ref Range   Glucose-Capillary 210 (*) 70 - 99 mg/dL  GLUCOSE, CAPILLARY     Status: Abnormal   Collection Time    02/08/14  7:38 AM      Result Value Ref Range   Glucose-Capillary 104 (*) 70 - 99 mg/dL  GLUCOSE, CAPILLARY     Status: Abnormal   Collection Time    02/08/14 12:03 PM      Result Value Ref Range   Glucose-Capillary 269 (*) 70 - 99 mg/dL  GLUCOSE, CAPILLARY     Status: Abnormal   Collection Time    02/08/14  4:50 PM      Result Value Ref Range   Glucose-Capillary 254 (*) 70 - 99 mg/dL  GLUCOSE, CAPILLARY     Status: Abnormal   Collection Time    02/08/14 10:12 PM      Result Value Ref Range   Glucose-Capillary 215 (*) 70 - 99 mg/dL  GLUCOSE, CAPILLARY     Status: Abnormal   Collection Time    02/09/14  7:15 AM      Result Value Ref Range   Glucose-Capillary 130 (*) 70 - 99 mg/dL     HEENT: poor dentition Cardio: RRR  and no murmur Resp: CTA B/L and unlabored GI: BS positive and NT,ND Extremity:  Pulses positive and No Edema Skin:   Intact and Wound with minimal drainage (AK). Appropriately tender Neuro: Alert/Oriented and Abnormal Motor 4/5 B HF, 5/5 B UE Musc/Skel:  Other bilateral BKA Gen NAD   Assessment/Plan: 1. 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 Physiatrist is providing close team supervision and 24 hour management of active medical problems listed below. Physiatrist and rehab team continue to assess barriers to discharge/monitor patient progress toward functional and medical goals. FIM: FIM - Bathing Bathing Steps Patient Completed: Chest;Right Arm;Left Arm;Abdomen;Front perineal area;Buttocks;Right upper leg;Left upper leg Bathing: 5: Set-up assist to: Obtain items  FIM - Upper Body Dressing/Undressing Upper body dressing/undressing steps patient completed:  Thread/unthread right sleeve of pullover shirt/dresss;Thread/unthread left sleeve of pullover shirt/dress;Put head through opening of pull over shirt/dress;Pull shirt over trunk Upper body dressing/undressing: 5: Set-up assist to: Obtain clothing/put away FIM - Lower Body Dressing/Undressing Lower body dressing/undressing steps patient completed: Thread/unthread right underwear leg;Thread/unthread left underwear leg;Thread/unthread right pants leg;Thread/unthread left pants leg;Pull underwear up/down;Pull pants up/down Lower body dressing/undressing: 5: Set-up assist to: Obtain clothing  FIM - Musician Devices: Grab bar or rail for support Toileting: 1: Total-Patient completed zero steps, helper did all 3  FIM - Radio producer Devices: Research officer, trade union Transfers: 4-To toilet/BSC: Min A (steadying Pt. > 75%);4-From toilet/BSC: Min A (steadying Pt. > 75%)  FIM - Bed/Chair Transfer Bed/Chair Transfer Assistive Devices: Arm rests;Sliding board Bed/Chair Transfer: 5: Supine > Sit: Supervision (verbal cues/safety issues);5: Bed > Chair or W/C: Supervision (verbal cues/safety issues)  FIM - Locomotion: Wheelchair Distance: 150 Locomotion: Wheelchair: 5: Travels 150 ft or more: maneuvers on rugs and over door sills with supervision, cueing or coaxing FIM - Locomotion: Ambulation Locomotion: Ambulation: 0: Activity did not occur  Comprehension Comprehension Mode: Auditory Comprehension: 6-Follows complex conversation/direction: With extra time/assistive device  Expression Expression Mode: Verbal Expression: 6-Expresses complex ideas: With extra time/assistive device  Social Interaction Social Interaction: 6-Interacts appropriately with others with medication or extra time (anti-anxiety, antidepressant).  Problem Solving Problem Solving: 6-Solves complex problems: With extra time  Memory Memory: 6-More than reasonable  amt of time  Medical Problem List and Plan:  1. Functional deficits secondary to Right BKA 01/30/2014 and History L BKA March 2015 and await prosthesis per [employer]   -will hold on prosthetic delivery at this time 2. DVT Prophylaxis/Anticoagulation: N/A Bilat BKA  3. Pain Management:lyrica 75 mg BID, Hydrocodone,robaxin as needed  4. Acute Blood Loss anemia.Continue Niferex.Follow up CBC  5. Neuropsych: This patient is capable of making decisions on his own behalf.  6. Diabetes mellitus with peripheral neuropathy.Latest hemoglobin A1c 8.6.  -Levemir 35 units HS  - levemir 15u AM---observe today for pattern 7.NICM/ICD.continue aspirin and plavix.no chest pain or shortness of breath  8.Hypertension.Coreg 12.5 mg daily.Monitor wiyh increased mobility  9.Chronic systolic CHF.Lasix 40 mg BID.Monitor for any signs of fluid overload.  10.Hyperlipidemia 10 mg daily  11.GERD.Protonix 12. Constipation adjusted laxatives  LOS (Days) 7 A FACE TO FACE EVALUATION WAS PERFORMED  SWARTZ,ZACHARY T 02/09/2014, 8:28 AM

## 2014-02-09 NOTE — Progress Notes (Signed)
Occupational Therapy Session Note  Patient Details  Name: [name] [name] MRN: [id number] Date of Birth: 07-18-1946  Today's Date: 02/09/2014 Time: 1300-1330 Time Calculation (min): 30 min  Short Term Goals: Week 1:  OT Short Term Goal 1 (Week 1): Pt will transfer to drop arm BSC with max A (anterior/ posterior transfer) OT Short Term Goal 2 (Week 1): Pt will don LB clothing (pants) with mod A with lateral leans  OT Short Term Goal 3 (Week 1): Pt will perform 1/3 toileting steps on toilet with lateral leans OT Short Term Goal 4 (Week 1): Pt will perform bed mobility in prep for ADL tasks with mod A  OT Short Term Goal 5 (Week 1): Pt will maintain static sitting wEOB/EOM with min A in prep for more dynmaic sitting balance during functioanl tasks  Skilled Therapeutic Interventions/Progress Updates:    1:1 focus on w/c<>drop arm commode transfers with SB with close supervision and A to set up board  And verbal cues for safety and sequencing. Pt able to pull down pants with supervision with lateral leans and extra time but required A for pulling up pants. Pt able to lean into w/c on right side to increase ability to lift hip to pull up pants.   Therapy Documentation Precautions:  Precautions Precautions: Fall Other Brace/Splint: prosthesis through [employer] with [name] not delivered yet Restrictions Weight Bearing Restrictions: Yes RLE Weight Bearing: Non weight bearing LLE Weight Bearing: Non weight bearing Pain:  no c/o pain in session   See FIM for current functional status  Therapy/Group: Individual Therapy  [name] [facility] 02/09/2014, 3:48 PM

## 2014-02-09 NOTE — Progress Notes (Signed)
Physical Therapy Note  Patient Details  Name: [name] [name] MRN: [id number] Date of Birth: [birth year] Today's Date: 02/09/2014  Time: 0930 - 1030 60 minutes  1:1, c/o 5/10 pain in R LE, eases with rest and repositioning.  Session focused on functional transfers, activity tolerance, core strength, LE strength, and w/c mobility.  Pt propelled w/c 2 x 150' with supervision.  A/P transfer w/c <> mat with supervision, required minimal cueing for sequencing and leg rests.  Pt required supervision for scooting and bed mobility on mat, no LOB.  3 x 10 sitting balance and reaching ring toss activity sitting EOM with supervision, no LOB.  LE ther ex, 2 x 10 each theraband resisted hip abd, glut bridging with BLE on bolster. 3 x 90 sec boxing activity in long sitting for core strength and dynamic sitting balance, supervision for balance with no LOB.  Kenn File 02/09/2014, 12:34 PM

## 2014-02-09 NOTE — Progress Notes (Signed)
Occupational Therapy Session Note  Patient Details  Name: [name] [name] MRN: [id number] Date of Birth: Mar 22, 1946  Today's Date: 02/09/2014 Time: 4268-3419 Time Calculation (min): 55 min  Short Term Goals: Week 1:  OT Short Term Goal 1 (Week 1): Pt will transfer to drop arm BSC with max A (anterior/ posterior transfer) OT Short Term Goal 2 (Week 1): Pt will don LB clothing (pants) with mod A with lateral leans  OT Short Term Goal 3 (Week 1): Pt will perform 1/3 toileting steps on toilet with lateral leans OT Short Term Goal 4 (Week 1): Pt will perform bed mobility in prep for ADL tasks with mod A  OT Short Term Goal 5 (Week 1): Pt will maintain static sitting wEOB/EOM with min A in prep for more dynmaic sitting balance during functioanl tasks  Skilled Therapeutic Interventions/Progress Updates:  6222-9798 - 55 Minutes Individual Therapy Patient with 5/10 complaints of pain in right residual limb.  Patient received supine in bed asleep. Patient easy to awake and arouse. Therapist set-up basin for ADL retraining at EOB level. Patient able to engage in bed mobility and perform supine>sit with supervision using bed rails. UB/LB bathing & dressing completed in seated position EOB, performing lateral leans for peri cleansing and LB dressing. Patient transferred EOB>w/c using slide board with supervision for safety and w/c & slide board set-up. Patient propelled self > sink for grooming task of brushing teeth. Therapist then engaged patient in BUE strengthening exercises using theraband. Therapist gave patient his breakfast tray and patient with complaints that not everything was on the tray. Therapist had patient call > nutritional services to ask for additional items; patient required min assist for this task in order to follow directions. At end of session, left patient seated in w/c beside bed with all needs within reach.   Precautions:  Precautions Precautions: Fall Other Brace/Splint:  prosthesis through [employer] with [name] not delivered yet Restrictions Weight Bearing Restrictions: Yes RLE Weight Bearing: Non weight bearing LLE Weight Bearing: Non weight bearing  See FIM for current functional status  Therapy/Group: Individual Therapy  CLAY,PATRICIA 02/09/2014, 8:53 AM

## 2014-02-10 ENCOUNTER — Encounter (HOSPITAL_COMMUNITY): Payer: Medicare Other | Admitting: Occupational Therapy

## 2014-02-10 ENCOUNTER — Inpatient Hospital Stay (HOSPITAL_COMMUNITY): Payer: Medicare Other

## 2014-02-10 ENCOUNTER — Inpatient Hospital Stay (HOSPITAL_COMMUNITY): Payer: Medicare Other | Admitting: Physical Therapy

## 2014-02-10 DIAGNOSIS — L98499 Non-pressure chronic ulcer of skin of other sites with unspecified severity: Secondary | ICD-10-CM

## 2014-02-10 DIAGNOSIS — I739 Peripheral vascular disease, unspecified: Secondary | ICD-10-CM

## 2014-02-10 DIAGNOSIS — S88119A Complete traumatic amputation at level between knee and ankle, unspecified lower leg, initial encounter: Secondary | ICD-10-CM

## 2014-02-10 LAB — GLUCOSE, CAPILLARY
GLUCOSE-CAPILLARY: 193 mg/dL — AB (ref 70–99)
GLUCOSE-CAPILLARY: 248 mg/dL — AB (ref 70–99)
Glucose-Capillary: 121 mg/dL — ABNORMAL HIGH (ref 70–99)
Glucose-Capillary: 137 mg/dL — ABNORMAL HIGH (ref 70–99)

## 2014-02-10 NOTE — Progress Notes (Signed)
Physical Therapy Note  Patient Details  Name: [name] [name] MRN: [id number] Date of Birth: 10/18/1945 Today's Date: 02/10/2014  Time: 1330 - 1400 30 minutes  1:1, no c/o pain.  Session focused on w/c mobility, UE strengthening/endurance, and activity tolerance.  Pt received sitting in w/c, with LLE shrinker doffed.  Prosthesis donned, noted increased swelling in L residual limb compared to AM session, pt educated on importance of wearing the shrinker when not wearing prosthesis.  Pt propelled w/c on ramp to 4th floor [place] tower with supervision to ascend/descend, educated pt on forward trunk flexion to change COG while ascending ramp.  Pt propelled w/c > 200' in controlled environment with supervision, decreased speed due to fatigue and weakness.  Pt instructed to remove prosthesis, check skin, and don shrinker at 1430, expressed understanding and RN aware.  Kenn File 02/10/2014, 2:11 PM

## 2014-02-10 NOTE — Discharge Summary (Signed)
NAMEMarland [name]  C[place], CREHAN NO.:  [id number]  MEDICAL RECORD NO.:  [id number]  LOCATION:  [id number]                        FACILITY:  [facility]  PHYSICIAN:  Meredith Staggers, M.D.DATE OF BIRTH:  1946-03-10  DATE OF ADMISSION:  02/02/2014 DATE OF DISCHARGE:  02/14/2014                              DISCHARGE SUMMARY   DISCHARGE DIAGNOSES: 1. Functional deficits secondary to right below-knee amputation on     January 30, 2014, with history of left below-knee amputation in March     2015. 2. Pain management. 3. Acute blood loss anemia. 4. Diabetes mellitus, peripheral neuropathy. 5. Nonischemic cardiomyopathy. 6. Hypertension. 7. Chronic systolic congestive heart failure. 8. Hyperlipidemia. 9. Gastroesophageal reflux disease. 10.Constipation, resolved.  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  The patient lives with his wife, primarily used a wheelchair prior to admission.  Admitted on January 30, 2014, with nonhealing right  foot ulcer.  Findings of osteomyelitis. No relief with conservative care.  Underwent right transtibial amputation on January 30, 2014, per Dr. Erlinda Hong.  Postoperative pain management. Acute blood loss anemia 8.6 and monitored.  Aspirin and Plavix resumed postoperatively for history of coronary artery disease.  Physical and occupational therapy ongoing.  The patient was admitted for comprehensive rehab program.  PAST MEDICAL HISTORY:  See discharge diagnoses.  SOCIAL HISTORY:  Lives with spouse.  FUNCTIONAL HISTORY:  Prior to admission, essentially wheelchair bound, awaiting prosthesis from recent left below-knee amputation.  Functional mobility upon admission to rehab services was max assist +2, posterior transfers; max assist, sit to stand; min to mod  assist, activities of daily living.  PHYSICAL EXAMINATION:  VITAL SIGNS:  Blood pressure 109/66, pulse 97, temperature 99, respirations 16. GENERAL:  This was an alert male in no acute distress, oriented x3. LUNGS:  Clear to auscultation. CARDIAC:  Regular rate and rhythm. ABDOMEN:  Soft, nontender.  Good bowel sounds. EXTREMITIES:  Left below-knee amputation well healed.  Right below-knee amputation site postoperative dressing, appropriately tender.  REHABILITATION HOSPITAL COURSE:  Patient was admitted to inpatient rehab services with therapies initiated on a 3-hour daily basis consisting of physical therapy, occupational therapy, and rehabilitation nursing.  The following issues were addressed during the patient's rehabilitation stay.  Pertaining to Mr. Brickle right below-knee amputation on January 30, 2014, surgical site healing nicely.  He would follow up with Dr. Erlinda Hong.  He had a history of left below-knee amputation in March 2015.  [employer] followed up for prosthesis.  Pain management with the use of Lyrica as well as hydrocodone.  Acute blood loss anemia stable with iron supplement.  Latest hemoglobin of 8.8.  No bleeding episodes.  He had a history of diabetes mellitus, peripheral neuropathy.  Hemoglobin A1c of 8.6.  He continued on Levemir insulin as directed diabetic teaching.  He had a history of coronary artery disease, nonischemic cardiomyopathy, continued on aspirin and Plavix.  No chest pain or shortness of breath. The patient received weekly collaborative interdisciplinary team conferences to discuss estimated length of stay, family teaching, and any barriers to discharge.  He did receive his left prosthesis that he was wearing for prosthetic training.  Practice, donning and doffing this prosthesis with minimal assistance.  He had a good understanding of this.  Occupational therapy, the patient able to dress himself upper body.  Recommendations were made for  skilled nursing facility due to limited caregiving support at home. Issues in regards to patient's co-pay for nursing home were discussed with patient and wife. Patient doing better reaching supervision level and it was later decided by family for discharge to home 02/14/2014 after family teaching completed  DISCHARGE MEDICATIONS: 1. Aspirin 325 mg p.o. daily. 2. Lipitor 10 mg p.o. daily. 3. Coreg 12.5 mg p.o. b.i.d. 4. Plavix 75 mg p.o. daily. 5. Lasix 40 mg p.o. b.i.d. 6. Hydrocodone 1-2 tablets every 4 hours as needed moderate pain,     dispense of 90 tablets. 7. Levemir insulin 15 units subcutaneous daily, 35 units     subcutaneously at bedtime. 8. Niferex 150 mg p.o. daily. 9. Robaxin 500 mg p.o. every 6 hours as needed muscle spasms. 10.Multivitamin 1 tablet p.o. daily. 11.Protonix 80 mg p.o. daily. 12.Potassium chloride 20 mEq p.o. b.i.d. 13.Lyrica 75 mg p.o. b.i.d. 14.Aldactone 12.5 mg p.o. at bedtime. 15.Vitamin E 400 units p.o. daily.  DIET:  Diabetic diet.  SPECIAL INSTRUCTIONS:  The patient would follow up with Dr. Alger Simons at the outpatient rehab service office as directed.  Dr. Erlinda Hong in 2 weeks call for appointment for removal of staples.     Lauraine Rinne, P.A.   ______________________________ Meredith Staggers, M.D.    DA/MEDQ  D:  02/10/2014  T:  02/10/2014  Job:  [id number]  cc:   [name], M.D. Eduard Roux, MD

## 2014-02-10 NOTE — Progress Notes (Signed)
Physical Therapy Weekly Progress Note  Patient Details  Name: [name] [name] MRN: [id number] Date of Birth: [birth year]  Beginning of progress report period: February 02, 2014 End of progress report period: February 10, 2014  Today's Date: 02/10/2014  Patient has met 4 of 4 long term goals.  Short term goals not set due to estimated length of stay.  Pt's goals have been upgraded from supervision to mod I based on continued progress and improved strength, mobility and balance.  Patient continues to demonstrate the following deficits: impaired strength and ROM and therefore will continue to benefit from skilled PT intervention to enhance overall performance with activity tolerance, balance, postural control and ability to compensate for deficits.  See Patient's Care Plan for progression toward long term goals.  Patient progressing toward long term goals..  Continue plan of care.  Skilled Therapeutic Interventions/Progress Updates:  Discharge planning;Balance/vestibular training;Community reintegration;Patient/family education;UE/LE Coordination activities;UE/LE Strength taining/ROM;Pain management;DME/adaptive equipment instruction;Neuromuscular re-education;Therapeutic Exercise;Wheelchair propulsion/positioning;Therapeutic Activities;Functional mobility training    See FIM for current functional status   DONAWERTH,KAREN 02/10/2014, 7:09 AM

## 2014-02-10 NOTE — Progress Notes (Signed)
Reviewed and in agreement with treatment provided.  

## 2014-02-10 NOTE — Progress Notes (Signed)
68 y.o. right-handed male with history of nonischemic cardiomyopathy with ICD, hypertension, chronic systolic congestive heart failure, PAF, diabetes mellitus with peripheral neuropathy and left BKA secondary to osteomyelitis received inpatient rehabilitation services in March of 2015 and currently awaiting prosthesis. Patient lives with his wife primarily using a wheelchair prior to admission and a walker for stand pivot transfers. Admitted 01/30/2014 with nonhealing right foot ulcers and findings of osteomyelitis. No relief with conservative care. Underwent right transtibial amputation 01/30/2014 per Dr.Xu  Subjective/Complaints: Resting comfortably.  Pain controlled Review of Systems -  Objective: Vital Signs: Blood pressure 92/66, pulse 84, temperature 97.5 F (36.4 C), temperature source Oral, resp. rate 18, weight 76.5 kg (168 lb 10.4 oz), SpO2 96.00%. No results found. Results for orders placed during the hospital encounter of 02/02/14 (from the past 72 hour(s))  GLUCOSE, CAPILLARY     Status: Abnormal   Collection Time    02/07/14 12:11 PM      Result Value Ref Range   Glucose-Capillary 191 (*) 70 - 99 mg/dL  GLUCOSE, CAPILLARY     Status: Abnormal   Collection Time    02/07/14  5:01 PM      Result Value Ref Range   Glucose-Capillary 254 (*) 70 - 99 mg/dL  GLUCOSE, CAPILLARY     Status: Abnormal   Collection Time    02/07/14  8:48 PM      Result Value Ref Range   Glucose-Capillary 210 (*) 70 - 99 mg/dL  GLUCOSE, CAPILLARY     Status: Abnormal   Collection Time    02/08/14  7:38 AM      Result Value Ref Range   Glucose-Capillary 104 (*) 70 - 99 mg/dL  GLUCOSE, CAPILLARY     Status: Abnormal   Collection Time    02/08/14 12:03 PM      Result Value Ref Range   Glucose-Capillary 269 (*) 70 - 99 mg/dL  GLUCOSE, CAPILLARY     Status: Abnormal   Collection Time    02/08/14  4:50 PM      Result Value Ref Range   Glucose-Capillary 254 (*) 70 - 99 mg/dL  GLUCOSE, CAPILLARY      Status: Abnormal   Collection Time    02/08/14 10:12 PM      Result Value Ref Range   Glucose-Capillary 215 (*) 70 - 99 mg/dL  GLUCOSE, CAPILLARY     Status: Abnormal   Collection Time    02/09/14  7:15 AM      Result Value Ref Range   Glucose-Capillary 130 (*) 70 - 99 mg/dL  GLUCOSE, CAPILLARY     Status: None   Collection Time    02/09/14 11:15 AM      Result Value Ref Range   Glucose-Capillary 84  70 - 99 mg/dL  GLUCOSE, CAPILLARY     Status: Abnormal   Collection Time    02/09/14  4:22 PM      Result Value Ref Range   Glucose-Capillary 207 (*) 70 - 99 mg/dL  GLUCOSE, CAPILLARY     Status: Abnormal   Collection Time    02/09/14  9:15 PM      Result Value Ref Range   Glucose-Capillary 241 (*) 70 - 99 mg/dL  GLUCOSE, CAPILLARY     Status: Abnormal   Collection Time    02/10/14  7:20 AM      Result Value Ref Range   Glucose-Capillary 121 (*) 70 - 99 mg/dL   Comment 1 Notify RN  HEENT: poor dentition Cardio: RRR and no murmur Resp: CTA B/L and unlabored GI: BS positive and NT,ND Extremity:  Pulses positive and No Edema Skin:   Intact and Wound with minimal drainage (AK). Appropriately tender Neuro: Alert/Oriented and Abnormal Motor 4/5 B HF, 5/5 B UE Musc/Skel:  Other bilateral BKA Gen NAD   Assessment/Plan: 1. 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 Physiatrist is providing close team supervision and 24 hour management of active medical problems listed below. Physiatrist and rehab team continue to assess barriers to discharge/monitor patient progress toward functional and medical goals. FIM: FIM - Bathing Bathing Steps Patient Completed: Chest;Right Arm;Left Arm;Abdomen;Front perineal area;Buttocks;Right upper leg;Left upper leg Bathing: 5: Set-up assist to: Obtain items  FIM - Upper Body Dressing/Undressing Upper body  dressing/undressing steps patient completed: Thread/unthread right sleeve of pullover shirt/dresss;Thread/unthread left sleeve of pullover shirt/dress;Put head through opening of pull over shirt/dress;Pull shirt over trunk Upper body dressing/undressing: 5: Set-up assist to: Obtain clothing/put away FIM - Lower Body Dressing/Undressing Lower body dressing/undressing steps patient completed: Thread/unthread right underwear leg;Thread/unthread left underwear leg;Thread/unthread right pants leg;Thread/unthread left pants leg;Pull underwear up/down;Pull pants up/down Lower body dressing/undressing: 5: Set-up assist to: Obtain clothing  FIM - Musician Devices: Grab bar or rail for support Toileting: 1: Total-Patient completed zero steps, helper did all 3  FIM - Radio producer Devices: Research officer, trade union Transfers: 4-To toilet/BSC: Min A (steadying Pt. > 75%);4-From toilet/BSC: Min A (steadying Pt. > 75%)  FIM - Bed/Chair Transfer Bed/Chair Transfer Assistive Devices: Arm rests Bed/Chair Transfer: 5: Supine > Sit: Supervision (verbal cues/safety issues);5: Bed > Chair or W/C: Supervision (verbal cues/safety issues);5: Chair or W/C > Bed: Supervision (verbal cues/safety issues);4: Sit > Supine: Min A (steadying pt. > 75%/lift 1 leg)  FIM - Locomotion: Wheelchair Distance: 200 Locomotion: Wheelchair: 5: Travels 150 ft or more: maneuvers on rugs and over door sills with supervision, cueing or coaxing FIM - Locomotion: Ambulation Locomotion: Ambulation: 0: Activity did not occur  Comprehension Comprehension Mode: Auditory Comprehension: 7-Follows complex conversation/direction: With no assist  Expression Expression Mode: Verbal Expression: 7-Expresses complex ideas: With no assist  Social Interaction Social Interaction: 7-Interacts appropriately with others - No medications needed.  Problem Solving Problem Solving: 7-Solves  complex problems: Recognizes & self-corrects  Memory Memory: 7-Complete Independence: No helper  Medical Problem List and Plan:  1. Functional deficits secondary to Right BKA 01/30/2014 and History L BKA March 2015 and await prosthesis per [employer]   -will hold on prosthetic delivery at this time 2. DVT Prophylaxis/Anticoagulation: N/A Bilat BKA  3. Pain Management:lyrica 75 mg BID, Hydrocodone,robaxin as needed  4. Acute Blood Loss anemia.Continue Niferex.Follow up CBC  5. Neuropsych: This patient is capable of making decisions on his own behalf.  6. Diabetes mellitus with peripheral neuropathy.Latest hemoglobin A1c 8.6.  -Levemir 35 units HS  - levemir 15u AM---continue for now (will need to adjust further after dc) 7.NICM/ICD.continue aspirin and plavix.no chest pain or shortness of breath  8.Hypertension.Coreg 12.5 mg daily.Monitor wiyh increased mobility  9.Chronic systolic CHF.Lasix 40 mg BID.Monitor for any signs of fluid overload.  10.Hyperlipidemia 10 mg daily  11.GERD.Protonix 12. Constipation adjusted laxatives  LOS (Days) 8 A FACE TO FACE EVALUATION WAS PERFORMED  Gio Janoski T 02/10/2014, 8:14 AM

## 2014-02-10 NOTE — Progress Notes (Signed)
Occupational Therapy Session Note  Patient Details  Name: [name] [name] MRN: [id number] Date of Birth: 1946-05-27  Today's Date: 02/10/2014 Time: 1100-1145 Time Calculation (min): 45 min  Short Term Goals: Week 1:  OT Short Term Goal 1 (Week 1): Pt will transfer to drop arm BSC with max A (anterior/ posterior transfer) OT Short Term Goal 2 (Week 1): Pt will don LB clothing (pants) with mod A with lateral leans  OT Short Term Goal 3 (Week 1): Pt will perform 1/3 toileting steps on toilet with lateral leans OT Short Term Goal 4 (Week 1): Pt will perform bed mobility in prep for ADL tasks with mod A  OT Short Term Goal 5 (Week 1): Pt will maintain static sitting wEOB/EOM with min A in prep for more dynmaic sitting balance during functioanl tasks  Skilled Therapeutic Interventions/Progress Updates:    UE exercise group with focus on UB strengthening with 2 lb weight (hand weight and bar) and green theraband in all planes focus on deltoids, traps, biceps, triceps, pec muscles, and scapular muscles to increased activity tolerance and improve strength in prep for ADL tasks and transfers. Rest breaks provided as needed.   Therapy Documentation Precautions:  Precautions Precautions: Fall Other Brace/Splint: prosthesis through [employer] with [name] not delivered yet Restrictions Weight Bearing Restrictions: Yes RLE Weight Bearing: Non weight bearing LLE Weight Bearing: Non weight bearing Pain: No c/o pain  See FIM for current functional status  Therapy/Group: Group Therapy  [name] [facility] 02/10/2014, 11:52 AM

## 2014-02-10 NOTE — Progress Notes (Signed)
Occupational Therapy Session Note  Patient Details  Name: [name] [name] MRN: [id number] Date of Birth: 06/09/1946  Today's Date: 02/10/2014 Time: 0730-0830 Time Calculation (min): 60 min  Short Term Goals: Week 1:  OT Short Term Goal 1 (Week 1): Pt will transfer to drop arm BSC with max A (anterior/ posterior transfer) OT Short Term Goal 2 (Week 1): Pt will don LB clothing (pants) with mod A with lateral leans  OT Short Term Goal 3 (Week 1): Pt will perform 1/3 toileting steps on toilet with lateral leans OT Short Term Goal 4 (Week 1): Pt will perform bed mobility in prep for ADL tasks with mod A  OT Short Term Goal 5 (Week 1): Pt will maintain static sitting wEOB/EOM with min A in prep for more dynmaic sitting balance during functioanl tasks  Skilled Therapeutic Interventions/Progress Updates: ADL-retraining with focus on bed mobility, dynamic sitting balance, prosthetics management @ L-LE.   Patient received supine in bed awaiting breakfast, alert and oriented w/o c/o pain.   Patient agreed to supine/sitting ADL with additional practice at donning LLE prosthesis prior to transfer to w/c.   Patient completed bathing with only setup assist but continues to struggle with completing dressing d/t incomplete sidelying while reaching to pull up pants (pt=90% of LB dressing).   Patient required re-ed on use of stockings and liners for prosthesis and required max assist to don prosthesis.  Patient attempted stand-pivot transfer to w/c but was unable to rise on LLE, even with bed raised; pt completed squat pivot transfer to w/c with only supervision for safety while wearing prosthesis.  Therapy Documentation Precautions:  Precautions Precautions: Fall Other Brace/Splint: prosthesis through [employer] with [name] not delivered yet Restrictions Weight Bearing Restrictions: Yes RLE Weight Bearing: Non weight bearing LLE Weight Bearing: Non weight bearing  Vital Signs: Therapy Vitals Temp: 97.5 F  (36.4 C) Temp src: Oral Pulse Rate: 84 Resp: 18 BP: 92/66 mmHg Patient Position (if appropriate): Lying Oxygen Therapy SpO2: 96 % O2 Device: None (Room air) Pain: Pain Assessment Pain Assessment: No/denies pain  See FIM for current functional status  Therapy/Group: Individual Therapy  BARTHOLD,FRANK 02/10/2014, 9:36 AM

## 2014-02-10 NOTE — Discharge Summary (Signed)
Discharge summary job # [id number]

## 2014-02-10 NOTE — Progress Notes (Signed)
Physical Therapy Note  Patient Details  Name: EFE FAZZINO MRN: [id number] Date of Birth: 1945-12-19 Today's Date: 02/10/2014  Time: 830-925 55 minutes  1:1 No c/o pain.  W/c mobility in controlled and home environments with supervision, cuing for w/c parts management.  Pt with L prosthesis here today, states he has never worn it.  Practiced don/doff prosthesis with min A, discussed importance of wear schedule and skin checks to prevent skin breakdown.  Pt states understanding.  Sit to stand on prosthesis with pt unable to achieve full standing due to hip and glute weakness, min A to partial stand 3 x 60 seconds.  A/p transfers with set up assist only.  Seated on mat PNF patterns for trunk strengthening with medicine ball.  Quadruped with alternating LE lifts for core, UE and LE strengthening.  Pt requires mod A to come to quadruped due to UE weakness, min A for balance with LE lifts, pt only able to tolerate quadruped 2-3 minutes before fatigue.   DONAWERTH,KAREN 02/10/2014, 9:24 AM

## 2014-02-11 ENCOUNTER — Inpatient Hospital Stay (HOSPITAL_COMMUNITY): Payer: Medicare Other | Admitting: Physical Therapy

## 2014-02-11 LAB — GLUCOSE, CAPILLARY
GLUCOSE-CAPILLARY: 230 mg/dL — AB (ref 70–99)
Glucose-Capillary: 183 mg/dL — ABNORMAL HIGH (ref 70–99)
Glucose-Capillary: 193 mg/dL — ABNORMAL HIGH (ref 70–99)
Glucose-Capillary: 192 mg/dL — ABNORMAL HIGH (ref 70–99)

## 2014-02-11 MED ORDER — INSULIN DETEMIR 100 UNIT/ML ~~LOC~~ SOLN
20.0000 [IU] | Freq: Every day | SUBCUTANEOUS | Status: DC
  Administered 2014-02-12: 20 [IU] via SUBCUTANEOUS
  Filled 2014-02-11 (×2): qty 0.2

## 2014-02-11 NOTE — Progress Notes (Signed)
68 y.o. right-handed male with history of nonischemic cardiomyopathy with ICD, hypertension, chronic systolic congestive heart failure, PAF, diabetes mellitus with peripheral neuropathy and left BKA secondary to osteomyelitis received inpatient rehabilitation services in March of 2015 and currently awaiting prosthesis. Patient lives with his wife primarily using a wheelchair prior to admission and a walker for stand pivot transfers. Admitted 01/30/2014 with nonhealing right foot ulcers and findings of osteomyelitis. No relief with conservative care. Underwent right transtibial amputation 01/30/2014 per Dr.Xu  Subjective/Complaints: No complaints. Received prosthesis Review of Systems -  Objective: Vital Signs: Blood pressure 103/68, pulse 83, temperature 98.2 F (36.8 C), temperature source Oral, resp. rate 18, weight 76.5 kg (168 lb 10.4 oz), SpO2 99.00%. No results found. Results for orders placed during the hospital encounter of 02/02/14 (from the past 72 hour(s))  GLUCOSE, CAPILLARY     Status: Abnormal   Collection Time    02/08/14 12:03 PM      Result Value Ref Range   Glucose-Capillary 269 (*) 70 - 99 mg/dL  GLUCOSE, CAPILLARY     Status: Abnormal   Collection Time    02/08/14  4:50 PM      Result Value Ref Range   Glucose-Capillary 254 (*) 70 - 99 mg/dL  GLUCOSE, CAPILLARY     Status: Abnormal   Collection Time    02/08/14 10:12 PM      Result Value Ref Range   Glucose-Capillary 215 (*) 70 - 99 mg/dL  GLUCOSE, CAPILLARY     Status: Abnormal   Collection Time    02/09/14  7:15 AM      Result Value Ref Range   Glucose-Capillary 130 (*) 70 - 99 mg/dL  GLUCOSE, CAPILLARY     Status: None   Collection Time    02/09/14 11:15 AM      Result Value Ref Range   Glucose-Capillary 84  70 - 99 mg/dL  GLUCOSE, CAPILLARY     Status: Abnormal   Collection Time    02/09/14  4:22 PM      Result Value Ref Range   Glucose-Capillary 207 (*) 70 - 99 mg/dL  GLUCOSE, CAPILLARY     Status:  Abnormal   Collection Time    02/09/14  9:15 PM      Result Value Ref Range   Glucose-Capillary 241 (*) 70 - 99 mg/dL  GLUCOSE, CAPILLARY     Status: Abnormal   Collection Time    02/10/14  7:20 AM      Result Value Ref Range   Glucose-Capillary 121 (*) 70 - 99 mg/dL   Comment 1 Notify RN    GLUCOSE, CAPILLARY     Status: Abnormal   Collection Time    02/10/14 12:29 PM      Result Value Ref Range   Glucose-Capillary 193 (*) 70 - 99 mg/dL   Comment 1 Notify RN    GLUCOSE, CAPILLARY     Status: Abnormal   Collection Time    02/10/14  4:28 PM      Result Value Ref Range   Glucose-Capillary 137 (*) 70 - 99 mg/dL   Comment 1 Notify RN    GLUCOSE, CAPILLARY     Status: Abnormal   Collection Time    02/10/14  9:14 PM      Result Value Ref Range   Glucose-Capillary 248 (*) 70 - 99 mg/dL  GLUCOSE, CAPILLARY     Status: Abnormal   Collection Time    02/11/14  7:16 AM  Result Value Ref Range   Glucose-Capillary 183 (*) 70 - 99 mg/dL   Comment 1 Notify RN       HEENT: poor dentition Cardio: RRR and no murmur Resp: CTA B/L and unlabored GI: BS positive and NT,ND Extremity:  Pulses positive and No Edema Skin:   Intact and Wound  AK). No drainage.  Appropriately tender Neuro: Alert/Oriented and Abnormal Motor 4/5 B HF, 5/5 B UE Musc/Skel:  Other bilateral BKA Gen NAD   Assessment/Plan: 1. 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 Physiatrist is providing close team supervision and 24 hour management of active medical problems listed below. Physiatrist and rehab team continue to assess barriers to discharge/monitor patient progress toward functional and medical goals.  Pt received prosthesis. Stood in it apparently yesterday.  FIM: FIM - Bathing Bathing Steps Patient Completed: Chest;Right Arm;Left Arm;Abdomen;Front perineal area;Buttocks;Right upper  leg;Left upper leg Bathing: 5: Set-up assist to: Obtain items  FIM - Upper Body Dressing/Undressing Upper body dressing/undressing steps patient completed: Thread/unthread right sleeve of pullover shirt/dresss;Thread/unthread left sleeve of pullover shirt/dress;Put head through opening of pull over shirt/dress;Pull shirt over trunk Upper body dressing/undressing: 5: Set-up assist to: Obtain clothing/put away FIM - Lower Body Dressing/Undressing Lower body dressing/undressing steps patient completed: Thread/unthread right underwear leg;Thread/unthread left underwear leg;Thread/unthread right pants leg;Thread/unthread left pants leg Lower body dressing/undressing: 4: Min-Patient completed 75 plus % of tasks  FIM - Musician Devices: Grab bar or rail for support Toileting: 1: Total-Patient completed zero steps, helper did all 3  FIM - Radio producer Devices: Research officer, trade union Transfers: 4-To toilet/BSC: Min A (steadying Pt. > 75%);4-From toilet/BSC: Min A (steadying Pt. > 75%)  FIM - Bed/Chair Transfer Bed/Chair Transfer Assistive Devices: Bed rails;Prosthesis;Walker Bed/Chair Transfer: 5: Supine > Sit: Supervision (verbal cues/safety issues);3: Bed > Chair or W/C: Mod A (lift or lower assist)  FIM - Locomotion: Wheelchair Distance: 200 Locomotion: Wheelchair: 5: Travels 150 ft or more: maneuvers on rugs and over door sills with supervision, cueing or coaxing FIM - Locomotion: Ambulation Locomotion: Ambulation: 0: Activity did not occur  Comprehension Comprehension Mode: Auditory Comprehension: 7-Follows complex conversation/direction: With no assist  Expression Expression Mode: Verbal Expression: 7-Expresses complex ideas: With no assist  Social Interaction Social Interaction: 7-Interacts appropriately with others - No medications needed.  Problem Solving Problem Solving: 7-Solves complex problems: Recognizes &  self-corrects  Memory Memory: 5-Recognizes or recalls 90% of the time/requires cueing < 10% of the time  Medical Problem List and Plan:  1. Functional deficits secondary to Right BKA 01/30/2014 and History L BKA March 2015 and await prosthesis per [employer]   -will hold on prosthetic delivery at this time 2. DVT Prophylaxis/Anticoagulation: N/A Bilat BKA  3. Pain Management:lyrica 75 mg BID, Hydrocodone,robaxin as needed  4. Acute Blood Loss anemia.Continue Niferex.Follow up CBC  5. Neuropsych: This patient is capable of making decisions on his own behalf.  6. Diabetes mellitus with peripheral neuropathy.Latest hemoglobin A1c 8.6.  -Levemir 35 units HS  - levemir---increase to 20 qam--  7.NICM/ICD.continue aspirin and plavix.no chest pain or shortness of breath  8.Hypertension.Coreg 12.5 mg daily.Monitor wiyh increased mobility  9.Chronic systolic CHF.Lasix 40 mg BID.Monitor for any signs of fluid overload.  10.Hyperlipidemia 10 mg daily  11.GERD.Protonix 12. Constipation adjusted laxatives  LOS (Days) 9 A FACE TO FACE EVALUATION WAS PERFORMED  Carlye Panameno T 02/11/2014, 8:13  AM    

## 2014-02-11 NOTE — Progress Notes (Signed)
Physical Therapy Session Note  Patient Details  Name: [name] [name] MRN: [id number] Date of Birth: 01/26/1946  Today's Date: 02/11/2014 Time: 1300-1330 Time Calculation (min): 30 min  Short Term Goals: Week 1:  PT Short Term Goal 1 (Week 1): STG = LTG due to ELOS  Skilled Therapeutic Interventions/Progress Updates:   Upon arrival pt requesting to use BSC to have BM.  Set up BSC beside pt; pt performed transfers w/c <> BSC with slideboard with min A with assistance to set up slideboard and then hold slideboard in position during transfer.  Pt also performed lateral leans and boosting with UE on BSC to doff and don brief and shorts.  Pt also performed lateral leans for hygiene.  Back in w/c pt requested to try standing with L prosthesis donned.  Unable to lock prosthesis in place with sock donned; with just liner pt able to lock prosthesis in place.  Attempted sit > stand with L prosthesis x 3 reps from w/c with UE on RW and then with UE on sink but unable to come to full stand secondary to lack of ankle joint and knee extension weakness to translate COG over L foot.  Will attempt again in // bars another day.    Therapy Documentation Precautions:  Precautions Precautions: Fall Other Brace/Splint: prosthesis through [employer] with [name] not delivered yet Restrictions Weight Bearing Restrictions: Yes RLE Weight Bearing: Non weight bearing LLE Weight Bearing: Non weight bearing Vital Signs: Therapy Vitals Temp: 98.2 F (36.8 C) Temp src: Oral Pulse Rate: 88 Resp: 18 BP: 103/65 mmHg Patient Position (if appropriate): Sitting Oxygen Therapy SpO2: 99 % O2 Device: None (Room air) Pain: Pain Assessment Pain Assessment: No/denies pain  See FIM for current functional status  Therapy/Group: Individual Therapy  [name] [facility] 02/11/2014, 2:41 PM

## 2014-02-12 DIAGNOSIS — L98499 Non-pressure chronic ulcer of skin of other sites with unspecified severity: Secondary | ICD-10-CM

## 2014-02-12 DIAGNOSIS — I739 Peripheral vascular disease, unspecified: Secondary | ICD-10-CM

## 2014-02-12 DIAGNOSIS — S88119A Complete traumatic amputation at level between knee and ankle, unspecified lower leg, initial encounter: Secondary | ICD-10-CM

## 2014-02-12 LAB — GLUCOSE, CAPILLARY
GLUCOSE-CAPILLARY: 92 mg/dL (ref 70–99)
GLUCOSE-CAPILLARY: 136 mg/dL — AB (ref 70–99)
Glucose-Capillary: 230 mg/dL — ABNORMAL HIGH (ref 70–99)
Glucose-Capillary: 180 mg/dL — ABNORMAL HIGH (ref 70–99)

## 2014-02-12 NOTE — Progress Notes (Signed)
68 y.o. right-handed male with history of nonischemic cardiomyopathy with ICD, hypertension, chronic systolic congestive heart failure, PAF, diabetes mellitus with peripheral neuropathy and left BKA secondary to osteomyelitis received inpatient rehabilitation services in March of 2015 and currently awaiting prosthesis. Patient lives with his wife primarily using a wheelchair prior to admission and a walker for stand pivot transfers. Admitted 01/30/2014 with nonhealing right foot ulcers and findings of osteomyelitis. No relief with conservative care. Underwent right transtibial amputation 01/30/2014 per Dr.Xu  Subjective/Complaints: No problems yesterday. Slept well.  Review of Systems - otherwise neg Objective: Vital Signs: Blood pressure 105/70, pulse 86, temperature 98.8 F (37.1 C), temperature source Oral, resp. rate 18, weight 76.5 kg (168 lb 10.4 oz), SpO2 95.00%. No results found. Results for orders placed during the hospital encounter of 02/02/14 (from the past 72 hour(s))  GLUCOSE, CAPILLARY     Status: None   Collection Time    02/09/14 11:15 AM      Result Value Ref Range   Glucose-Capillary 84  70 - 99 mg/dL  GLUCOSE, CAPILLARY     Status: Abnormal   Collection Time    02/09/14  4:22 PM      Result Value Ref Range   Glucose-Capillary 207 (*) 70 - 99 mg/dL  GLUCOSE, CAPILLARY     Status: Abnormal   Collection Time    02/09/14  9:15 PM      Result Value Ref Range   Glucose-Capillary 241 (*) 70 - 99 mg/dL  GLUCOSE, CAPILLARY     Status: Abnormal   Collection Time    02/10/14  7:20 AM      Result Value Ref Range   Glucose-Capillary 121 (*) 70 - 99 mg/dL   Comment 1 Notify RN    GLUCOSE, CAPILLARY     Status: Abnormal   Collection Time    02/10/14 12:29 PM      Result Value Ref Range   Glucose-Capillary 193 (*) 70 - 99 mg/dL   Comment 1 Notify RN    GLUCOSE, CAPILLARY     Status: Abnormal   Collection Time    02/10/14  4:28 PM      Result Value Ref Range   Glucose-Capillary 137 (*) 70 - 99 mg/dL   Comment 1 Notify RN    GLUCOSE, CAPILLARY     Status: Abnormal   Collection Time    02/10/14  9:14 PM      Result Value Ref Range   Glucose-Capillary 248 (*) 70 - 99 mg/dL  GLUCOSE, CAPILLARY     Status: Abnormal   Collection Time    02/11/14  7:16 AM      Result Value Ref Range   Glucose-Capillary 183 (*) 70 - 99 mg/dL   Comment 1 Notify RN    GLUCOSE, CAPILLARY     Status: Abnormal   Collection Time    02/11/14 11:22 AM      Result Value Ref Range   Glucose-Capillary 193 (*) 70 - 99 mg/dL   Comment 1 Notify RN    GLUCOSE, CAPILLARY     Status: Abnormal   Collection Time    02/11/14  4:28 PM      Result Value Ref Range   Glucose-Capillary 192 (*) 70 - 99 mg/dL   Comment 1 Notify RN    GLUCOSE, CAPILLARY     Status: Abnormal   Collection Time    02/11/14  8:49 PM      Result Value Ref Range   Glucose-Capillary  230 (*) 70 - 99 mg/dL  GLUCOSE, CAPILLARY     Status: None   Collection Time    02/12/14  7:45 AM      Result Value Ref Range   Glucose-Capillary 92  70 - 99 mg/dL   Comment 1 Notify RN       HEENT: poor dentition Cardio: RRR and no murmur Resp: CTA B/L and unlabored GI: BS positive and NT,ND Extremity:  Pulses positive and No Edema Skin:   Intact and Wound  AK, with minimal drainage. Well shaped leg.[name]  Appropriately tender Neuro: Alert/Oriented and Abnormal Motor 4/5 B HF, 5/5 B UE Musc/Skel:  Other bilateral BKA Gen NAD   Assessment/Plan: 1. 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 Physiatrist is providing close team supervision and 24 hour management of active medical problems listed below. Physiatrist and rehab team continue to assess barriers to discharge/monitor patient progress toward functional and medical goals.  Pt received prosthesis. Stood in it apparently yesterday.  FIM: FIM -  Bathing Bathing Steps Patient Completed: Chest;Right Arm;Left Arm;Abdomen;Front perineal area;Buttocks;Right upper leg;Left upper leg Bathing: 5: Set-up assist to: Obtain items  FIM - Upper Body Dressing/Undressing Upper body dressing/undressing steps patient completed: Thread/unthread right sleeve of pullover shirt/dresss;Thread/unthread left sleeve of pullover shirt/dress;Put head through opening of pull over shirt/dress;Pull shirt over trunk Upper body dressing/undressing: 5: Set-up assist to: Obtain clothing/put away FIM - Lower Body Dressing/Undressing Lower body dressing/undressing steps patient completed: Thread/unthread right underwear leg;Thread/unthread left underwear leg;Thread/unthread right pants leg;Thread/unthread left pants leg Lower body dressing/undressing: 4: Min-Patient completed 75 plus % of tasks  FIM - Musician Devices: Grab bar or rail for support Toileting: 1: Total-Patient completed zero steps, helper did all 3  FIM - Radio producer Devices: Research officer, trade union Transfers: 4-To toilet/BSC: Min A (steadying Pt. > 75%);4-From toilet/BSC: Min A (steadying Pt. > 75%)  FIM - Bed/Chair Transfer Bed/Chair Transfer Assistive Devices: Bed rails;Prosthesis;Walker Bed/Chair Transfer: 5: Supine > Sit: Supervision (verbal cues/safety issues);3: Bed > Chair or W/C: Mod A (lift or lower assist)  FIM - Locomotion: Wheelchair Distance: 200 Locomotion: Wheelchair: 0: Activity did not occur FIM - Locomotion: Ambulation Locomotion: Ambulation: 0: Activity did not occur  Comprehension Comprehension Mode: Auditory Comprehension: 7-Follows complex conversation/direction: With no assist  Expression Expression Mode: Verbal Expression: 7-Expresses complex ideas: With no assist  Social Interaction Social Interaction: 7-Interacts appropriately with others - No medications needed.  Problem Solving Problem Solving:  7-Solves complex problems: Recognizes & self-corrects  Memory Memory: 5-Recognizes or recalls 90% of the time/requires cueing < 10% of the time  Medical Problem List and Plan:  1. Functional deficits secondary to Right BKA 01/30/2014 and History L BKA March 2015 and await prosthesis per [employer]   -will hold on prosthetic delivery at this time 2. DVT Prophylaxis/Anticoagulation: N/A Bilat BKA  3. Pain Management:lyrica 75 mg BID, Hydrocodone,robaxin as needed  4. Acute Blood Loss anemia.Continue Niferex.Follow up CBC  5. Neuropsych: This patient is capable of making decisions on his own behalf.  6. Diabetes mellitus with peripheral neuropathy.Latest hemoglobin A1c 8.6.  -Levemir 35 units HS  - levemir---increase to 20 qam--observe for results today  7.NICM/ICD.continue aspirin and plavix.no chest pain or shortness of breath  8.Hypertension.Coreg 12.5 mg daily.Monitor wiyh increased mobility  9.Chronic systolic CHF.Lasix 40 mg BID.Monitor for any signs of fluid overload.  10.Hyperlipidemia  10 mg daily  11.GERD.Protonix 12. Constipation adjusted laxatives  LOS (Days) 10 A FACE TO FACE EVALUATION WAS PERFORMED  SWARTZ,ZACHARY T 02/12/2014, 8:05 AM

## 2014-02-13 ENCOUNTER — Encounter (HOSPITAL_COMMUNITY): Payer: Medicare Other | Admitting: Physical Therapy

## 2014-02-13 ENCOUNTER — Inpatient Hospital Stay (HOSPITAL_COMMUNITY): Payer: Medicare Other | Admitting: Physical Therapy

## 2014-02-13 ENCOUNTER — Inpatient Hospital Stay (HOSPITAL_COMMUNITY): Payer: Medicare Other

## 2014-02-13 DIAGNOSIS — L98499 Non-pressure chronic ulcer of skin of other sites with unspecified severity: Secondary | ICD-10-CM

## 2014-02-13 DIAGNOSIS — I739 Peripheral vascular disease, unspecified: Secondary | ICD-10-CM

## 2014-02-13 DIAGNOSIS — S88119A Complete traumatic amputation at level between knee and ankle, unspecified lower leg, initial encounter: Secondary | ICD-10-CM

## 2014-02-13 LAB — GLUCOSE, CAPILLARY
GLUCOSE-CAPILLARY: 205 mg/dL — AB (ref 70–99)
Glucose-Capillary: 152 mg/dL — ABNORMAL HIGH (ref 70–99)
Glucose-Capillary: 151 mg/dL — ABNORMAL HIGH (ref 70–99)
Glucose-Capillary: 213 mg/dL — ABNORMAL HIGH (ref 70–99)

## 2014-02-13 MED ORDER — ATORVASTATIN CALCIUM 10 MG PO TABS: 10.0000 mg | ORAL_TABLET | Freq: Every day | ORAL | Status: DC

## 2014-02-13 MED ORDER — METHOCARBAMOL 500 MG PO TABS: 500.0000 mg | ORAL_TABLET | Freq: Four times a day (QID) | ORAL | Status: DC | PRN

## 2014-02-13 MED ORDER — INSULIN DETEMIR 100 UNIT/ML ~~LOC~~ SOLN: SUBCUTANEOUS | Status: DC

## 2014-02-13 MED ORDER — FUROSEMIDE 40 MG PO TABS: 40.0000 mg | ORAL_TABLET | Freq: Two times a day (BID) | ORAL | Status: DC

## 2014-02-13 MED ORDER — INSULIN DETEMIR 100 UNIT/ML ~~LOC~~ SOLN
25.0000 [IU] | Freq: Every day | SUBCUTANEOUS | Status: DC
  Administered 2014-02-13 – 2014-02-14 (×2): 25 [IU] via SUBCUTANEOUS
  Filled 2014-02-13 (×2): qty 0.25

## 2014-02-13 MED ORDER — CLOPIDOGREL BISULFATE 75 MG PO TABS: 75.0000 mg | ORAL_TABLET | Freq: Every day | ORAL | Status: DC

## 2014-02-13 MED ORDER — POLYSACCHARIDE IRON COMPLEX 150 MG PO CAPS: 150.0000 mg | ORAL_CAPSULE | Freq: Every day | ORAL | Status: DC

## 2014-02-13 MED ORDER — ASPIRIN 325 MG PO TBEC: 325.0000 mg | DELAYED_RELEASE_TABLET | Freq: Every day | ORAL | Status: DC

## 2014-02-13 MED ORDER — CARVEDILOL 12.5 MG PO TABS: 12.5000 mg | ORAL_TABLET | Freq: Two times a day (BID) | ORAL | Status: DC

## 2014-02-13 MED ORDER — SPIRONOLACTONE 25 MG PO TABS: 12.5000 mg | ORAL_TABLET | Freq: Every day | ORAL | Status: DC

## 2014-02-13 MED ORDER — HYDROCODONE-ACETAMINOPHEN 5-325 MG PO TABS: 1.0000 | ORAL_TABLET | ORAL | Status: DC | PRN

## 2014-02-13 MED ORDER — POTASSIUM CHLORIDE CRYS ER 20 MEQ PO TBCR: 20.0000 meq | EXTENDED_RELEASE_TABLET | Freq: Two times a day (BID) | ORAL | Status: DC

## 2014-02-13 MED ORDER — PREGABALIN 75 MG PO CAPS: 75.0000 mg | ORAL_CAPSULE | Freq: Two times a day (BID) | ORAL | Status: DC

## 2014-02-13 MED ORDER — ESOMEPRAZOLE MAGNESIUM 40 MG PO CPDR: 40.0000 mg | DELAYED_RELEASE_CAPSULE | Freq: Every day | ORAL | Status: DC | PRN

## 2014-02-13 NOTE — Progress Notes (Signed)
Occupational Therapy Session Note  Patient Details  Name: [name] [name] MRN: [id number] Date of Birth: 1945/12/09  Today's Date: 02/13/2014 Time: 0730-0827 Time Calculation (min): 57 min  Short Term Goals: Week 2:  OT Short Term Goal 1 (Week 2): STG=LTG due to anticipated transfer to SNF for continued rehab prior to return home  Skilled Therapeutic Interventions/Progress Updates: ADL-retraining with focus on improved sit<>stand using prosthesis, lower body dressing, transfers.   Patient required extra time, use of DME, setup, and steadying assist to rise from supine to sitting at edge of bed with verbal cues for placement of sliding board.   Patient is able to complete both slide board and AP transfers with supervision-min A but does not express his preference for either during treatment.   Patient requested shower-level ADL this session and performed slide board transfer to tub bench with setup, steadying assist and verbal cues for safety.  Patient completed seated shower unassisted and returned to w/c to complete dressing, using prosthesis to complete partial stand for assisted lower body dressing (pulling up pants).   Patient groomed at w/c level by sink and self-feed, independently.     Therapy Documentation Precautions:  Precautions Precautions: Fall Other Brace/Splint: prosthesis through [employer] with [name] not delivered yet Restrictions Weight Bearing Restrictions: Yes RLE Weight Bearing: Non weight bearing LLE Weight Bearing: Non weight bearing  Vital Signs: Therapy Vitals Temp: 97.9 F (36.6 C) Temp src: Oral Pulse Rate: 99 Resp: 18 BP: 107/66 mmHg Patient Position (if appropriate): Lying Oxygen Therapy SpO2: 98 % O2 Device: None (Room air)  Pain: Pain Assessment Pain Assessment: No/denies pain  See FIM for current functional status  Therapy/Group: Individual Therapy  BARTHOLD,FRANK 02/13/2014, 8:52 AM

## 2014-02-13 NOTE — Progress Notes (Signed)
Physical Therapy Discharge Summary  Patient Details  Name: [name] [name] MRN: [id number] Date of Birth: 06-Jun-1946  Today's Date: 02/13/2014  Patient has met 3 of 4 long term goals due to improved activity tolerance, improved balance, improved postural control, increased strength and ability to compensate for deficits.  Patient to discharge at a wheelchair level Supervision for transfers, mod I with w/c mobility.    Reasons goals not met: pt continues to be supervision for transfers, needs assist for set up and cues  Recommendation:  Patient will benefit from ongoing skilled PT services in skilled nursing facility setting to continue to advance safe functional mobility, address ongoing impairments in strength, mobility, safety, and minimize fall risk.  Equipment: No equipment provided  Reasons for discharge: treatment goals met and discharge from hospital  Patient/family agrees with progress made and goals achieved: Yes  PT Discharge Precautions/Restrictions Precautions Precautions: Fall Restrictions Weight Bearing Restrictions: Yes RLE Weight Bearing: Non weight bearing LLE Weight Bearing: Non weight bearing  Cognition Overall Cognitive Status: Within Functional Limits for tasks assessed Orientation Level: Oriented X4 Sensation Sensation Light Touch: Appears Intact Proprioception: Appears Intact Coordination Gross Motor Movements are Fluid and Coordinated: Yes Motor  Motor Motor - Skilled Clinical Observations: generalized weakness  Trunk/Postural Assessment  Cervical Assessment Cervical Assessment: Within Functional Limits Thoracic Assessment Thoracic Assessment: Within Functional Limits Lumbar Assessment Lumbar Assessment:  (posterior pelvic tilt) Postural Control Postural Control: Within Functional Limits  Balance Static Sitting Balance Static Sitting - Level of Assistance: 6: Modified independent (Device/Increase time) Dynamic Sitting Balance Dynamic  Sitting - Level of Assistance: 6: Modified independent (Device/Increase time) Static Standing Balance Static Standing - Balance Support: Bilateral upper extremity supported (in parallel bars) Static Standing - Level of Assistance: 4: Min assist Extremity Assessment      RLE Strength RLE Overall Strength Comments: grossly 3+/5 LLE Strength LLE Overall Strength: Within Functional Limits for tasks assessed  See FIM for current functional status  DONAWERTH,KAREN 02/13/2014, 10:43 AM

## 2014-02-13 NOTE — Progress Notes (Signed)
68 y.o. right-handed male with history of nonischemic cardiomyopathy with ICD, hypertension, chronic systolic congestive heart failure, PAF, diabetes mellitus with peripheral neuropathy and left BKA secondary to osteomyelitis received inpatient rehabilitation services in March of 2015 and currently awaiting prosthesis. Patient lives with his wife primarily using a wheelchair prior to admission and a walker for stand pivot transfers. Admitted 01/30/2014 with nonhealing right foot ulcers and findings of osteomyelitis. No relief with conservative care. Underwent right transtibial amputation 01/30/2014 per Dr.Xu  Subjective/Complaints: Pt awake.no distress.  Review of Systems - otherwise neg Objective: Vital Signs: Blood pressure 107/66, pulse 99, temperature 97.9 F (36.6 C), temperature source Oral, resp. rate 18, weight 76.5 kg (168 lb 10.4 oz), SpO2 98.00%. No results found. Results for orders placed during the hospital encounter of 02/02/14 (from the past 72 hour(s))  GLUCOSE, CAPILLARY     Status: Abnormal   Collection Time    02/10/14 12:29 PM      Result Value Ref Range   Glucose-Capillary 193 (*) 70 - 99 mg/dL   Comment 1 Notify RN    GLUCOSE, CAPILLARY     Status: Abnormal   Collection Time    02/10/14  4:28 PM      Result Value Ref Range   Glucose-Capillary 137 (*) 70 - 99 mg/dL   Comment 1 Notify RN    GLUCOSE, CAPILLARY     Status: Abnormal   Collection Time    02/10/14  9:14 PM      Result Value Ref Range   Glucose-Capillary 248 (*) 70 - 99 mg/dL  GLUCOSE, CAPILLARY     Status: Abnormal   Collection Time    02/11/14  7:16 AM      Result Value Ref Range   Glucose-Capillary 183 (*) 70 - 99 mg/dL   Comment 1 Notify RN    GLUCOSE, CAPILLARY     Status: Abnormal   Collection Time    02/11/14 11:22 AM      Result Value Ref Range   Glucose-Capillary 193 (*) 70 - 99 mg/dL   Comment 1 Notify RN    GLUCOSE, CAPILLARY     Status: Abnormal   Collection Time    02/11/14  4:28  PM      Result Value Ref Range   Glucose-Capillary 192 (*) 70 - 99 mg/dL   Comment 1 Notify RN    GLUCOSE, CAPILLARY     Status: Abnormal   Collection Time    02/11/14  8:49 PM      Result Value Ref Range   Glucose-Capillary 230 (*) 70 - 99 mg/dL  GLUCOSE, CAPILLARY     Status: None   Collection Time    02/12/14  7:45 AM      Result Value Ref Range   Glucose-Capillary 92  70 - 99 mg/dL   Comment 1 Notify RN    GLUCOSE, CAPILLARY     Status: Abnormal   Collection Time    02/12/14 11:33 AM      Result Value Ref Range   Glucose-Capillary 136 (*) 70 - 99 mg/dL   Comment 1 Notify RN    GLUCOSE, CAPILLARY     Status: Abnormal   Collection Time    02/12/14  4:44 PM      Result Value Ref Range   Glucose-Capillary 230 (*) 70 - 99 mg/dL  GLUCOSE, CAPILLARY     Status: Abnormal   Collection Time    02/12/14  8:50 PM  Result Value Ref Range   Glucose-Capillary 180 (*) 70 - 99 mg/dL  GLUCOSE, CAPILLARY     Status: Abnormal   Collection Time    02/13/14  7:38 AM      Result Value Ref Range   Glucose-Capillary 152 (*) 70 - 99 mg/dL     HEENT: poor dentition Cardio: RRR and no murmur Resp: CTA B/L and unlabored GI: BS positive and NT,ND Extremity:  Pulses positive and No Edema Skin:   Intact and Wound  AK, with minimal drainage. Well shaped leg.[name]  Appropriately tender Neuro: Alert/Oriented and Abnormal Motor 4/5 B HF, 5/5 B UE Musc/Skel:  Other bilateral BKA Gen NAD   Assessment/Plan: 1. 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 Physiatrist is providing close team supervision and 24 hour management of active medical problems listed below. Physiatrist and rehab team continue to assess barriers to discharge/monitor patient progress toward functional and medical goals.  Pt received prosthesis. snf pending  FIM: FIM - Bathing Bathing Steps Patient  Completed: Chest;Right Arm;Left Arm;Abdomen;Front perineal area;Buttocks;Right upper leg;Left upper leg Bathing: 5: Set-up assist to: Obtain items  FIM - Upper Body Dressing/Undressing Upper body dressing/undressing steps patient completed: Thread/unthread right sleeve of pullover shirt/dresss;Thread/unthread left sleeve of pullover shirt/dress;Put head through opening of pull over shirt/dress;Pull shirt over trunk Upper body dressing/undressing: 5: Set-up assist to: Obtain clothing/put away FIM - Lower Body Dressing/Undressing Lower body dressing/undressing steps patient completed: Thread/unthread right underwear leg;Thread/unthread left underwear leg;Thread/unthread right pants leg;Thread/unthread left pants leg Lower body dressing/undressing: 4: Min-Patient completed 75 plus % of tasks  FIM - Musician Devices: Grab bar or rail for support Toileting: 1: Total-Patient completed zero steps, helper did all 3  FIM - Radio producer Devices: Sliding board;Bedside commode Toilet Transfers: 4-To toilet/BSC: Min A (steadying Pt. > 75%);4-From toilet/BSC: Min A (steadying Pt. > 75%)  FIM - Bed/Chair Transfer Bed/Chair Transfer Assistive Devices: Bed rails;Prosthesis;Walker Bed/Chair Transfer: 5: Supine > Sit: Supervision (verbal cues/safety issues);3: Bed > Chair or W/C: Mod A (lift or lower assist)  FIM - Locomotion: Wheelchair Distance: 200 Locomotion: Wheelchair: 0: Activity did not occur FIM - Locomotion: Ambulation Locomotion: Ambulation: 0: Activity did not occur  Comprehension Comprehension Mode: Auditory Comprehension: 7-Follows complex conversation/direction: With no assist  Expression Expression Mode: Verbal Expression: 7-Expresses complex ideas: With no assist  Social Interaction Social Interaction: 7-Interacts appropriately with others - No medications needed.  Problem Solving Problem Solving: 7-Solves complex problems:  Recognizes & self-corrects  Memory Memory: 5-Recognizes or recalls 90% of the time/requires cueing < 10% of the time  Medical Problem List and Plan:  1. Functional deficits secondary to Right BKA 01/30/2014 and History L BKA March 2015 and await prosthesis per [employer]   -will hold on prosthetic delivery at this time 2. DVT Prophylaxis/Anticoagulation: N/A Bilat BKA  3. Pain Management:lyrica 75 mg BID, Hydrocodone,robaxin as needed  4. Acute Blood Loss anemia.Continue Niferex.Follow up CBC  5. Neuropsych: This patient is capable of making decisions on his own behalf.  6. Diabetes mellitus with peripheral neuropathy.Latest hemoglobin A1c 8.6.  -Levemir 35 units HS  - levemir---increase to 25 qam--  7.NICM/ICD.continue aspirin and plavix.no chest pain or shortness of breath  8.Hypertension.Coreg 12.5 mg daily.Monitor wiyh increased mobility  9.Chronic systolic CHF.Lasix 40 mg BID.Monitor for any signs of fluid overload.  10.Hyperlipidemia 10 mg daily  11.GERD.Protonix 12. Constipation  adjusted laxatives  LOS (Days) 11 A FACE TO FACE EVALUATION WAS PERFORMED  SWARTZ,ZACHARY T 02/13/2014, 7:57 AM

## 2014-02-13 NOTE — Progress Notes (Signed)
Social Work Patient ID: [name] [name], male   DOB: 1946/05/30, 68 y.o.   MRN: [id number]  Contacted by SNF and informed pt would be responsible for co-pay costs to facility immediately on admit.  Informed wife who reports she cannot pay this and has decided to take pt home tomorrow. She will be in for education this afternoon.  Pt reaching supervision level and she is available to provide this.  Some physical assist with ADLs and will ask OT to review.  Have alerted tx team to change in plan.  Willadene Mounsey, LCSW

## 2014-02-13 NOTE — Progress Notes (Signed)
Physical Therapy Note  Patient Details  Name: NICHOALS HEYDE MRN: [id number] Date of Birth: [birth year] Today's Date: 02/13/2014  Time: 830-915 45 minutes  1:1 Pt c/o 4/10 pain in R LE, RN made aware and meds rec'd.  W/c mobility in home and controlled environments performed at mod I level.  Sit to stand in parallel bars 3 x 60 seconds with pt requiring only min A to stand, able to achieve full upright posture with tactile cues for glute contraction.  Pt reports no increased pain with standing on prosthesis.  Pt able to doff prosthesis to perform A/P transfer with supervision to mat.  Performed prone therex for LE strengthening needing AAROM for hip extension B.  Quadruped alternating UE lifts and LE lifts for core control and stability with close supervision/min A , pt requires frequent rest breaks for quadruped activities.   DONAWERTH,KAREN 02/13/2014, 9:11 AM

## 2014-02-13 NOTE — Progress Notes (Signed)
Social Work Patient ID: [name] [name], male   DOB: [birth year], 68 y.o.   MRN: [id number]   Have received bed offer from [facility] on Friday and wife and patient have accepted offer.  Plan to transfer to facility today via ambulance once all paperwork completed by wife.  Will keep team posted.  Riggs Dineen, LCSW

## 2014-02-14 ENCOUNTER — Encounter (HOSPITAL_COMMUNITY): Payer: Medicare Other

## 2014-02-14 DIAGNOSIS — S88119A Complete traumatic amputation at level between knee and ankle, unspecified lower leg, initial encounter: Secondary | ICD-10-CM

## 2014-02-14 DIAGNOSIS — I739 Peripheral vascular disease, unspecified: Secondary | ICD-10-CM

## 2014-02-14 DIAGNOSIS — L98499 Non-pressure chronic ulcer of skin of other sites with unspecified severity: Secondary | ICD-10-CM

## 2014-02-14 LAB — GLUCOSE, CAPILLARY: Glucose-Capillary: 115 mg/dL — ABNORMAL HIGH (ref 70–99)

## 2014-02-14 NOTE — Progress Notes (Signed)
Pt discharged at 52 to home with family. Discharge instructions given to pt and family by Silvestre Mesi, [place] with verbal understanding.

## 2014-02-14 NOTE — Progress Notes (Signed)
Social Work Discharge Note  The overall goal for the admission was met for:   Discharge location: Yes - home with wife to provide 24/7 assistance  Length of Stay: No - extended due to change in d/c plan from home - SNF - home; LOS = 12 days  Discharge activity level: Yes - supervision w/c level  Home/community participation: Yes  Services provided included: MD, RD, PT, OT, RN, TR, Pharmacy and [place]: Private Insurance: [facility] Medicare  Follow-up services arranged: Home Health: RN, PT, OT, SW via [facility], DME: NA - had all needed DME and Patient/Family has no preference for HH/DME agencies  Comments (or additional information):  Patient/Family verbalized understanding of follow-up arrangements: Yes  Individual responsible for coordination of the follow-up plan: patient/ wife  Confirmed correct DME delivered: NA    HOYLE, LUCY

## 2014-02-14 NOTE — Progress Notes (Signed)
68 y.o. right-handed male with history of nonischemic cardiomyopathy with ICD, hypertension, chronic systolic congestive heart failure, PAF, diabetes mellitus with peripheral neuropathy and left BKA secondary to osteomyelitis received inpatient rehabilitation services in March of 2015 and currently awaiting prosthesis. Patient lives with his wife primarily using a wheelchair prior to admission and a walker for stand pivot transfers. Admitted 01/30/2014 with nonhealing right foot ulcers and findings of osteomyelitis. No relief with conservative care. Underwent right transtibial amputation 01/30/2014 per Dr.Xu  Subjective/Complaints: No complaints. Not going to SNF because of cost. Sugars still elevated in pm Review of Systems - otherwise neg Objective: Vital Signs: Blood pressure 111/74, pulse 93, temperature 98.3 F (36.8 C), temperature source Oral, resp. rate 16, weight 76.5 kg (168 lb 10.4 oz), SpO2 99.00%. No results found. Results for orders placed during the hospital encounter of 02/02/14 (from the past 72 hour(s))  GLUCOSE, CAPILLARY     Status: Abnormal   Collection Time    02/11/14 11:22 AM      Result Value Ref Range   Glucose-Capillary 193 (*) 70 - 99 mg/dL   Comment 1 Notify RN    GLUCOSE, CAPILLARY     Status: Abnormal   Collection Time    02/11/14  4:28 PM      Result Value Ref Range   Glucose-Capillary 192 (*) 70 - 99 mg/dL   Comment 1 Notify RN    GLUCOSE, CAPILLARY     Status: Abnormal   Collection Time    02/11/14  8:49 PM      Result Value Ref Range   Glucose-Capillary 230 (*) 70 - 99 mg/dL  GLUCOSE, CAPILLARY     Status: None   Collection Time    02/12/14  7:45 AM      Result Value Ref Range   Glucose-Capillary 92  70 - 99 mg/dL   Comment 1 Notify RN    GLUCOSE, CAPILLARY     Status: Abnormal   Collection Time    02/12/14 11:33 AM      Result Value Ref Range   Glucose-Capillary 136 (*) 70 - 99 mg/dL   Comment 1 Notify RN    GLUCOSE, CAPILLARY     Status:  Abnormal   Collection Time    02/12/14  4:44 PM      Result Value Ref Range   Glucose-Capillary 230 (*) 70 - 99 mg/dL  GLUCOSE, CAPILLARY     Status: Abnormal   Collection Time    02/12/14  8:50 PM      Result Value Ref Range   Glucose-Capillary 180 (*) 70 - 99 mg/dL  GLUCOSE, CAPILLARY     Status: Abnormal   Collection Time    02/13/14  7:38 AM      Result Value Ref Range   Glucose-Capillary 152 (*) 70 - 99 mg/dL  GLUCOSE, CAPILLARY     Status: Abnormal   Collection Time    02/13/14 11:52 AM      Result Value Ref Range   Glucose-Capillary 151 (*) 70 - 99 mg/dL  GLUCOSE, CAPILLARY     Status: Abnormal   Collection Time    02/13/14  4:12 PM      Result Value Ref Range   Glucose-Capillary 213 (*) 70 - 99 mg/dL  GLUCOSE, CAPILLARY     Status: Abnormal   Collection Time    02/13/14  8:53 PM      Result Value Ref Range   Glucose-Capillary 205 (*) 70 - 99 mg/dL  Comment 1 Notify RN    GLUCOSE, CAPILLARY     Status: Abnormal   Collection Time    02/14/14  7:34 AM      Result Value Ref Range   Glucose-Capillary 115 (*) 70 - 99 mg/dL     HEENT: poor dentition Cardio: RRR and no murmur Resp: CTA B/L and unlabored GI: BS positive and NT,ND Extremity:  Pulses positive and No Edema Skin:   Intact and Wound  AK, with minimal drainage. Well shaped leg.[name]  Appropriately tender Neuro: Alert/Oriented and Abnormal Motor 4/5 B HF, 5/5 B UE Musc/Skel:  Other bilateral BKA Gen NAD   Assessment/Plan: 1. 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 Physiatrist is providing close team supervision and 24 hour management of active medical problems listed below. Physiatrist and rehab team continue to assess barriers to discharge/monitor patient progress toward functional and medical goals.  Dc today with hh follow up  FIM: FIM - Bathing Bathing Steps Patient  Completed: Chest;Right Arm;Left Arm;Abdomen;Front perineal area;Buttocks;Right upper leg;Left upper leg Bathing: 6: More than reasonable amount of time  FIM - Upper Body Dressing/Undressing Upper body dressing/undressing steps patient completed: Thread/unthread right sleeve of pullover shirt/dresss;Thread/unthread left sleeve of pullover shirt/dress;Put head through opening of pull over shirt/dress;Pull shirt over trunk Upper body dressing/undressing: 5: Set-up assist to: Obtain clothing/put away FIM - Lower Body Dressing/Undressing Lower body dressing/undressing steps patient completed: Thread/unthread left underwear leg;Thread/unthread right underwear leg;Thread/unthread right pants leg;Thread/unthread left pants leg Lower body dressing/undressing: 4: Min-Patient completed 75 plus % of tasks  FIM - Musician Devices: Grab bar or rail for support Toileting: 1: Total-Patient completed zero steps, helper did all 3  FIM - Radio producer Devices: Sliding board;Bedside commode Toilet Transfers: 4-To toilet/BSC: Min A (steadying Pt. > 75%);4-From toilet/BSC: Min A (steadying Pt. > 75%)  FIM - Control and instrumentation engineer Devices: Sliding board;Arm rests;Bed rails;HOB elevated Bed/Chair Transfer: 4: Supine > Sit: Min A (steadying Pt. > 75%/lift 1 leg);4: Sit > Supine: Min A (steadying pt. > 75%/lift 1 leg)  FIM - Locomotion: Wheelchair Distance: 200 Locomotion: Wheelchair: 0: Activity did not occur FIM - Locomotion: Ambulation Locomotion: Ambulation: 0: Activity did not occur  Comprehension Comprehension Mode: Auditory Comprehension: 5-Understands complex 90% of the time/Cues < 10% of the time  Expression Expression Mode: Verbal Expression: 7-Expresses complex ideas: With no assist  Social Interaction Social Interaction: 7-Interacts appropriately with others - No medications needed.  Problem Solving Problem Solving:  5-Solves complex 90% of the time/cues < 10% of the time  Memory Memory: 5-Recognizes or recalls 90% of the time/requires cueing < 10% of the time  Medical Problem List and Plan:  1. Functional deficits secondary to Right BKA 01/30/2014 and History L BKA March 2015 and await prosthesis per [employer]   -can place shrinker today before dc 2. DVT Prophylaxis/Anticoagulation: LE movement 3. Pain Management:lyrica 75 mg BID, Hydrocodone,robaxin as needed  4. Acute Blood Loss anemia.Continue Niferex.Follow up CBC  5. Neuropsych: This patient is capable of making decisions on his own behalf.  6. Diabetes mellitus with peripheral neuropathy.Latest hemoglobin A1c 8.6.  -Levemir 35 units HS  - levemir---increased to 25 qam-->>further titration at home 7.NICM/ICD.continue aspirin and plavix.no chest pain or shortness of breath  8.Hypertension.Coreg 12.5 mg daily.Monitor wiyh increased mobility  9.Chronic systolic CHF.Lasix 40 mg BID.Monitor for any signs of fluid  overload.  10.Hyperlipidemia 10 mg daily  11.GERD.Protonix 12. Constipation adjusted laxatives  LOS (Days) 12 A FACE TO FACE EVALUATION WAS PERFORMED  SWARTZ,ZACHARY T 02/14/2014, 7:46 AM

## 2014-02-14 NOTE — Discharge Instructions (Signed)
Inpatient Rehab Discharge Instructions  [name] [name] Discharge date and time: No discharge date for patient encounter.   Activities/Precautions/ Functional Status: Activity: activity as tolerated Diet: diabetic diet Wound Care: keep wound clean and dry Functional status:  ___ No restrictions     ___ Walk up steps independently _x__ 24/7 supervision/assistance   ___ Walk up steps with assistance ___ Intermittent supervision/assistance  ___ Bathe/dress independently ___ Walk with walker     ___ Bathe/dress with assistance ___ Walk Independently    ___ Shower independently ___ Walk with assistance    ___ Shower with assistance ___ No alcohol     ___ Return to work/school ________    COMMUNITY REFERRALS UPON DISCHARGE:    Home Health:   PT     OT     RN       SW                   Agency: [facility] Phone: [phone]    GENERAL COMMUNITY RESOURCES FOR PATIENT/FAMILY:  Support Groups: Amputee Group      Special Instructions:  Followup with Dr. Erlinda Hong 2 weeks for removal of staples  My questions have been answered and I understand these instructions. I will adhere to these goals and the provided educational materials after my discharge from the hospital.  Patient/Caregiver Signature _______________________________ Date __________  Clinician Signature _______________________________________ Date __________  Please bring this form and your medication list with you to all your follow-up doctor's appointments.

## 2014-02-14 NOTE — Progress Notes (Signed)
Occupational Therapy Session Note  Patient Details  Name: [name] [name] MRN: [id number] Date of Birth: June 01, 1946  Today's Date: 02/14/2014 Time: 1000-1040 Time Calculation (min): 40 min  Short Term Goals: Week 2:  OT Short Term Goal 1 (Week 2): STG=LTG due to anticipated transfer to SNF for continued rehab prior to return home  Skilled Therapeutic Interventions/Progress Updates: Per SW contact, d/c plan is now to return home d/t financial limitation with SNF placement.   ADL-retraining with focus on family (wife present) relating to performance of ADL bed-level (sitting/supine), anterior/posterior transfer to [facility], compensatory strategies for mild cognitive impairment, AE use (slide board and transfer handle), and management of LLE prosthesis when used during ADL.   Patient received in his w/c, awaiting arrival of his wife for planned training.  Patient was dressed and reported receiving assistance from CNA with setup for bathing in bed.   Wife arrived and confirmed plan for continued bathing/dressing at bed level (sitting/supine).   OT educated spouse on patient's need for steadying assist during transfers to/from w/c to/from bed and/or ABSC, and use of AE (transfer handle) or furniture during bed mobility.   Spouse reported competence with steadying assist during transfers and currently provides supervision for impaired cognitive from prior training.   OT advised against use of slide board as patient is unaware of movement of board during transfers and is unable to problem-solve consistently when needed during transfer.   Patient demonstrated anterior/posterior transfer on/off BSC (w/drop-arm left up) and required steadying assist and min verbal cues to progress through transfer, as spouse observed.   Session concluded with inspection of transfer handle attached to bed in ADL apartment.   OT recommended use of transfer handle or similar method for improved bed mobility.        Therapy  Documentation Precautions:  Precautions Precautions: Fall Other Brace/Splint: prosthesis through [employer] with [name] not delivered yet Restrictions Weight Bearing Restrictions: Yes RLE Weight Bearing: Non weight bearing LLE Weight Bearing: Non weight bearing  Pain: Pain Assessment Pain Assessment: No/denies pain  See FIM for current functional status  Therapy/Group: Individual Therapy  BARTHOLD,FRANK 02/14/2014, 10:48 AM

## 2014-02-15 ENCOUNTER — Encounter: Payer: Self-pay | Admitting: Internal Medicine

## 2014-02-15 ENCOUNTER — Ambulatory Visit (INDEPENDENT_AMBULATORY_CARE_PROVIDER_SITE_OTHER): Payer: Medicare Other | Admitting: *Deleted

## 2014-02-15 DIAGNOSIS — I428 Other cardiomyopathies: Secondary | ICD-10-CM

## 2014-02-15 DIAGNOSIS — Z95810 Presence of automatic (implantable) cardiac defibrillator: Secondary | ICD-10-CM

## 2014-02-16 NOTE — Progress Notes (Signed)
Occupational Therapy Discharge Summary  Patient Details  Name: [name] [name] MRN: [id number] Date of Birth: 09/20/1945  Today's Date: 02/16/2014  Patient has met 4 of 6 long term goals due to improved activity tolerance, improved balance, ability to compensate for deficits and improved awareness.  Patient to discharge at [facility] Assist level.  Patient's care partner is independent to provide the necessary physical and cognitive assistance at discharge to provide setup assist for upper body bathing/dressing, min assist for lower body, and steadying assist during functional transfers using anterior/posterior approach.    Reasons goals not met: bil UE weakness limiting ability to maintain position during transfer  Recommendation:  Patient will benefit from ongoing skilled OT services in home health setting to continue to advance functional skills in the area of BADL.  Equipment: No equipment provided  Reasons for discharge: discharge from hospital  Patient/family agrees with progress made and goals achieved: Yes  OT Discharge Precautions/Restrictions  Precautions Precautions: Fall Restrictions Weight Bearing Restrictions: Yes RLE Weight Bearing: Non weight bearing  Pain Pain Assessment Pain Assessment: No/denies pain  ADL ADL ADL Comments: see FIM  Vision/Perception  Vision- History Baseline Vision/History: No visual deficits Patient Visual Report: No change from baseline Vision- Assessment Vision Assessment?: No apparent visual deficits   Cognition Overall Cognitive Status: Within Functional Limits for tasks assessed Arousal/Alertness: Awake/alert Orientation Level: Oriented X4 Attention: Selective Selective Attention: Appears intact Memory: Impaired Memory Impairment: Decreased recall of new information Awareness: Impaired Awareness Impairment: Anticipatory impairment Problem Solving: Impaired Problem Solving Impairment: Functional  complex Safety/Judgment: Impaired Comments: will attempt to continue through complex transfers unassisted (requires verbal cues)  Sensation Sensation Light Touch: Appears Intact Stereognosis: Appears Intact Hot/Cold: Appears Intact Proprioception: Appears Intact Additional Comments: patient reports some numbness in fingertips. Coordination Gross Motor Movements are Fluid and Coordinated: Yes Fine Motor Movements are Fluid and Coordinated: Yes  Motor  Motor Motor: Within Functional Limits Motor - Skilled Clinical Observations: generalized weakness  Mobility  Bed Mobility Bed Mobility: Rolling Right;Supine to Sit;Sit to Supine Rolling Right: 6: Modified independent (Device/Increase time) Supine to Sit: 4: Min guard Sit to Supine: 5: Supervision Transfers Transfers: Not assessed   Trunk/Postural Assessment  Cervical Assessment Cervical Assessment: Within Functional Limits Thoracic Assessment Thoracic Assessment: Within Functional Limits Lumbar Assessment Lumbar Assessment: Within Functional Limits Postural Control Postural Control: Within Functional Limits Trunk Control: imparied righting reaction with generalized weakness resulting in inconsistent performance with bed mobility   Balance Static Sitting Balance Static Sitting - Level of Assistance: 6: Modified independent (Device/Increase time) Dynamic Sitting Balance Dynamic Sitting - Level of Assistance: 6: Modified independent (Device/Increase time)  Extremity/Trunk Assessment RUE Assessment RUE Assessment: Within Functional Limits LUE Assessment LUE Assessment: Within Functional Limits  See FIM for current functional status  BARTHOLD,FRANK 02/16/2014, 10:01 AM

## 2014-02-16 NOTE — Progress Notes (Signed)
Remote ICD transmission.   

## 2014-02-20 LAB — MDC_IDC_ENUM_SESS_TYPE_REMOTE
Brady Statistic RA Percent Paced: 0 %
HighPow Impedance: 44 Ohm
Implantable Pulse Generator Serial Number: 587847
Lead Channel Impedance Value: 519 Ohm
MDC IDC MSMT LEADCHNL LV IMPEDANCE VALUE: 524 Ohm
MDC IDC MSMT LEADCHNL RA SENSING INTR AMPL: 4.1 mV
MDC IDC MSMT LEADCHNL RV IMPEDANCE VALUE: 492 Ohm
MDC IDC STAT BRADY RV PERCENT PACED: 99 %

## 2014-02-27 ENCOUNTER — Other Ambulatory Visit: Payer: Self-pay | Admitting: Internal Medicine

## 2014-02-28 ENCOUNTER — Encounter: Payer: Self-pay | Admitting: Cardiology

## 2014-03-02 ENCOUNTER — Telehealth: Payer: Self-pay | Admitting: *Deleted

## 2014-03-02 NOTE — Telephone Encounter (Signed)
Just an FYI:  [name] [name] OT called to report that they have finished Mr  July's therapy 2 visits early-he has met his goals- and they will discharging services.

## 2014-03-03 ENCOUNTER — Telehealth: Payer: Self-pay | Admitting: *Deleted

## 2014-03-03 DIAGNOSIS — I5022 Chronic systolic (congestive) heart failure: Secondary | ICD-10-CM

## 2014-03-03 DIAGNOSIS — G609 Hereditary and idiopathic neuropathy, unspecified: Secondary | ICD-10-CM

## 2014-03-03 DIAGNOSIS — E1165 Type 2 diabetes mellitus with hyperglycemia: Secondary | ICD-10-CM

## 2014-03-03 DIAGNOSIS — L89109 Pressure ulcer of unspecified part of back, unspecified stage: Secondary | ICD-10-CM

## 2014-03-03 DIAGNOSIS — IMO0002 Reserved for concepts with insufficient information to code with codable children: Secondary | ICD-10-CM

## 2014-03-03 DIAGNOSIS — L97409 Non-pressure chronic ulcer of unspecified heel and midfoot with unspecified severity: Secondary | ICD-10-CM

## 2014-03-03 DIAGNOSIS — L8992 Pressure ulcer of unspecified site, stage 2: Secondary | ICD-10-CM

## 2014-03-03 DIAGNOSIS — Z4789 Encounter for other orthopedic aftercare: Secondary | ICD-10-CM

## 2014-03-03 DIAGNOSIS — I509 Heart failure, unspecified: Secondary | ICD-10-CM

## 2014-03-03 DIAGNOSIS — L97509 Non-pressure chronic ulcer of other part of unspecified foot with unspecified severity: Secondary | ICD-10-CM

## 2014-03-03 DIAGNOSIS — I739 Peripheral vascular disease, unspecified: Secondary | ICD-10-CM

## 2014-03-03 DIAGNOSIS — M9080 Osteopathy in diseases classified elsewhere, unspecified site: Secondary | ICD-10-CM

## 2014-03-03 DIAGNOSIS — E1169 Type 2 diabetes mellitus with other specified complication: Secondary | ICD-10-CM

## 2014-03-03 NOTE — Telephone Encounter (Signed)
Lorriane Shire RN  [facility] called to get VO to extend visits for one week to close out case.  Approval given.

## 2014-04-21 ENCOUNTER — Encounter: Payer: Medicare Other | Admitting: Physical Medicine & Rehabilitation

## 2014-05-16 ENCOUNTER — Ambulatory Visit: Payer: Medicare Other | Admitting: Physical Therapy

## 2014-05-16 ENCOUNTER — Ambulatory Visit: Payer: Medicare Other | Attending: Internal Medicine | Admitting: Physical Therapy

## 2014-05-16 DIAGNOSIS — R5381 Other malaise: Secondary | ICD-10-CM | POA: Insufficient documentation

## 2014-05-16 DIAGNOSIS — IMO0001 Reserved for inherently not codable concepts without codable children: Secondary | ICD-10-CM | POA: Insufficient documentation

## 2014-05-16 DIAGNOSIS — R269 Unspecified abnormalities of gait and mobility: Secondary | ICD-10-CM | POA: Insufficient documentation

## 2014-05-23 ENCOUNTER — Ambulatory Visit: Payer: Medicare Other | Admitting: Physical Therapy

## 2014-05-23 DIAGNOSIS — IMO0001 Reserved for inherently not codable concepts without codable children: Secondary | ICD-10-CM | POA: Diagnosis not present

## 2014-05-25 ENCOUNTER — Encounter: Payer: Self-pay | Admitting: Internal Medicine

## 2014-05-25 ENCOUNTER — Ambulatory Visit (INDEPENDENT_AMBULATORY_CARE_PROVIDER_SITE_OTHER): Payer: Medicare Other | Admitting: *Deleted

## 2014-05-25 DIAGNOSIS — I428 Other cardiomyopathies: Secondary | ICD-10-CM

## 2014-05-25 DIAGNOSIS — I429 Cardiomyopathy, unspecified: Secondary | ICD-10-CM

## 2014-05-25 DIAGNOSIS — Z95810 Presence of automatic (implantable) cardiac defibrillator: Secondary | ICD-10-CM

## 2014-05-25 NOTE — Progress Notes (Signed)
Remote ICD transmission.   

## 2014-05-29 LAB — MDC_IDC_ENUM_SESS_TYPE_REMOTE
Battery Remaining Longevity: 5.5
Brady Statistic RV Percent Paced: 100 %
HighPow Impedance: 47 Ohm
Implantable Pulse Generator Serial Number: 587847
Lead Channel Impedance Value: 544 Ohm
Lead Channel Impedance Value: 570 Ohm
Lead Channel Sensing Intrinsic Amplitude: 5.1 mV
Lead Channel Setting Pacing Amplitude: 2 V
Lead Channel Setting Pacing Amplitude: 2.4 V
Lead Channel Setting Pacing Amplitude: 2.5 V
Lead Channel Setting Pacing Pulse Width: 0.4 ms
Lead Channel Setting Sensing Sensitivity: 1 mV
MDC IDC MSMT LEADCHNL RA IMPEDANCE VALUE: 537 Ohm
MDC IDC SET LEADCHNL RV PACING PULSEWIDTH: 0.4 ms
MDC IDC SET LEADCHNL RV SENSING SENSITIVITY: 0.5 mV
MDC IDC SET ZONE DETECTION INTERVAL: 250 ms
MDC IDC SET ZONE DETECTION INTERVAL: 343 ms
MDC IDC STAT BRADY RA PERCENT PACED: 0 %
Zone Setting Detection Interval: 300 ms

## 2014-05-30 ENCOUNTER — Ambulatory Visit: Payer: Medicare Other | Attending: Physical Medicine & Rehabilitation | Admitting: Physical Therapy

## 2014-05-30 DIAGNOSIS — R5381 Other malaise: Secondary | ICD-10-CM | POA: Insufficient documentation

## 2014-05-30 DIAGNOSIS — Z89512 Acquired absence of left leg below knee: Secondary | ICD-10-CM | POA: Insufficient documentation

## 2014-05-30 DIAGNOSIS — R269 Unspecified abnormalities of gait and mobility: Secondary | ICD-10-CM | POA: Diagnosis not present

## 2014-05-30 DIAGNOSIS — Z9716 Presence of artificial legs, bilateral (complete) (partial): Secondary | ICD-10-CM | POA: Diagnosis not present

## 2014-05-30 DIAGNOSIS — Z89511 Acquired absence of right leg below knee: Secondary | ICD-10-CM | POA: Diagnosis present

## 2014-06-02 ENCOUNTER — Ambulatory Visit: Payer: Medicare Other | Admitting: Physical Therapy

## 2014-06-02 DIAGNOSIS — Z89512 Acquired absence of left leg below knee: Secondary | ICD-10-CM | POA: Diagnosis not present

## 2014-06-05 ENCOUNTER — Ambulatory Visit: Payer: Medicare Other | Admitting: Physical Therapy

## 2014-06-05 DIAGNOSIS — Z89512 Acquired absence of left leg below knee: Secondary | ICD-10-CM | POA: Diagnosis not present

## 2014-06-07 ENCOUNTER — Ambulatory Visit: Payer: Medicare Other | Admitting: Physical Therapy

## 2014-06-07 DIAGNOSIS — Z89512 Acquired absence of left leg below knee: Secondary | ICD-10-CM | POA: Diagnosis not present

## 2014-06-13 ENCOUNTER — Encounter: Payer: Self-pay | Admitting: Cardiology

## 2014-06-13 ENCOUNTER — Ambulatory Visit: Payer: Medicare Other | Admitting: Physical Therapy

## 2014-06-13 DIAGNOSIS — Z89512 Acquired absence of left leg below knee: Secondary | ICD-10-CM | POA: Diagnosis not present

## 2014-06-15 ENCOUNTER — Ambulatory Visit: Payer: Medicare Other | Admitting: Physical Therapy

## 2014-06-15 DIAGNOSIS — Z89512 Acquired absence of left leg below knee: Secondary | ICD-10-CM | POA: Diagnosis not present

## 2014-06-19 ENCOUNTER — Ambulatory Visit: Payer: Medicare Other | Admitting: Physical Therapy

## 2014-06-19 DIAGNOSIS — Z89512 Acquired absence of left leg below knee: Secondary | ICD-10-CM | POA: Diagnosis not present

## 2014-06-22 ENCOUNTER — Ambulatory Visit: Payer: Medicare Other | Admitting: Physical Therapy

## 2014-06-22 DIAGNOSIS — Z89512 Acquired absence of left leg below knee: Secondary | ICD-10-CM | POA: Diagnosis not present

## 2014-06-26 ENCOUNTER — Encounter: Payer: Self-pay | Admitting: Physical Therapy

## 2014-06-26 ENCOUNTER — Ambulatory Visit: Payer: Medicare Other | Attending: Physical Medicine & Rehabilitation | Admitting: Physical Therapy

## 2014-06-26 DIAGNOSIS — Z89512 Acquired absence of left leg below knee: Secondary | ICD-10-CM | POA: Diagnosis present

## 2014-06-26 DIAGNOSIS — Z89511 Acquired absence of right leg below knee: Secondary | ICD-10-CM | POA: Diagnosis present

## 2014-06-26 DIAGNOSIS — Z9716 Presence of artificial legs, bilateral (complete) (partial): Secondary | ICD-10-CM | POA: Diagnosis not present

## 2014-06-26 DIAGNOSIS — R269 Unspecified abnormalities of gait and mobility: Secondary | ICD-10-CM

## 2014-06-26 DIAGNOSIS — R6889 Other general symptoms and signs: Secondary | ICD-10-CM

## 2014-06-26 DIAGNOSIS — R5381 Other malaise: Secondary | ICD-10-CM | POA: Diagnosis not present

## 2014-06-26 NOTE — Therapy (Signed)
Physical Therapy Treatment  Patient Details  Name: [name] [name] MRN: [id number] Date of Birth: 1946/07/26  Encounter Date: 06/26/2014      PT End of Session - 06/26/14 1024    Visit Number 11   Number of Visits 17   Date for PT Re-Evaluation 07/14/14   PT Start Time 0998   PT Stop Time 1115   PT Time Calculation (min) 60 min   Equipment Utilized During Treatment Gait belt   Activity Tolerance Patient tolerated treatment well;Patient limited by fatigue      Past Medical History  Diagnosis Date  . GERD (gastroesophageal reflux disease)   . Obesity   . Cardiomyopathy, nonischemic     a. Cath 2003: mild nonobstructive CAD, EF 25% at that time.  . Hypertension   . Chronic systolic CHF (congestive heart failure)     a. NICM EF 25% dating back to at least 2003.  [name] PAF (paroxysmal atrial fibrillation)     a. Noted on ICD interrogation 2012;  b. coumadin d/c'd 01/2013.  [name] NSVT (nonsustained ventricular tachycardia)     a. Noted on ICD interrogation in 2011.  [name] LBBB (left bundle branch block)   . Critical lower limb ischemia   . High cholesterol   . PAD (peripheral artery disease)     a. 08/2013 Periph Angio/PTA: Abd Ao nl, RLE- 3v runoff, PT diff dzs, AT 90p, LLE 2v runoff, PT 100, AT 22m (diamondback ORA/chocolate balloon PTA).  . Type II diabetes mellitus   . Uncontrolled pain, Lt toe 09/21/2013  . Gangrenous toe, Lt toe 09/21/2013  . CAD (coronary artery disease)     a. Nonobstructive by cath 09/2001.  [name] Automatic implantable cardioverter-defibrillator in situ     a. 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  . Dementia   . Arthritis   . History of blood transfusion   . Complication of anesthesia   . PONV (postoperative nausea and vomiting)     Past Surgical History  Procedure Laterality Date  . Lithotripsy  2001  . Cervical spine surgery  1994  . Cardiac defibrillator placement  06/2009    WITH GENERATOR REPLACED; BiV ICD  . US  echocardiography  03/21/2008    EF 30-35%  . Cardiovascular stress test  03/20/2009    EF 33%  . Transluminal atherectomy tibial artery Left 09/12/2013  . Cardiac catheterization  10/01/2001    THERE WAS GLOBAL HYPOKINESIS AND EF 25%. THERE APPEARED TO BE GLOBAL DECREASE IN WALL MOTION  . Toe amputation  10/04/2013    LEFT GREAT TOE AND 4TH TOE   /   DR Erlinda Hong  . Amputation Left 10/04/2013    Procedure: LEFT GREAT TOE AND SECOND TOE AMPUTATION;  Surgeon: Marianna Payment, MD;  Location: [place];  Service: Orthopedics;  Laterality: Left;  . Colonoscopy    . Leg amputation below knee Left 11/09/2013    DR Erlinda Hong  . Amputation Left 11/09/2013    Procedure: LEFT AMPUTATION BELOW KNEE;  Surgeon: Marianna Payment, MD;  Location: [place];  Service: Orthopedics;  Laterality: Left;  . Coronary angioplasty    . Amputation Right 01/04/2014    Procedure: Doristine Devoid second and third toe amputation;  Surgeon: Marianna Payment, MD;  Location: [place];  Service: Orthopedics;  Laterality: Right;  . Amputation Right 01/30/2014    Procedure: RIGHT BELOW KNEE AMPUTATION;  Surgeon: Marianna Payment, MD;  Location: [place];  Service: Orthopedics;  Laterality: Right;  There were no vitals taken for this visit.  Visit Diagnosis:  Abnormality of gait  Decreased activity tolerance          Adult PT Treatment/Exercise - 06/26/14 0700    Sit to Stand 4: Min guard;With upper extremity assist   Sit to Stand Details (indicate cue type and reason) cues on technique /safety to forearm crutches   Stand to Sit 4: Min guard;With upper extremity assist   Squat Pivot Transfers 5: Supervision   Ambulation/Gait Yes   Ambulation/Gait Assistance 4: Min assist   Ambulation/Gait Assistance Details 4 point pattern /cued & posture   Ambulation Distance (Feet) 230 Feet   Assistive device Lofstrands   Gait Pattern Step-through pattern   Stairs Yes   Stairs Assistance 4: Min assist   Stairs Assistance Details (indicate cue type and  reason) cues on sequence   Stair Management Technique One rail Right;With crutches   Number of Stairs 4   Ramp 3: Mod assist;Other (comment)   Ramp Details (indicate cue type and reason) 2nd person for safety; constant cues for sequence /posture   Curb 3: Mod assist   Curb Details (indicate cue type and reason) 2nd person for safety; constant cues for sequence /posture   Balance Assessed Yes   Static Sitting - Balance Support --   Static Sitting - Level of Assistance --   Static Sitting - Comment/# of Minutes --   Static Standing - Balance Support Left upper extremity supported   Static Standing - Level of Assistance 4: Min assist   Static Standing - Comment/# of Minutes 2  balance during toileting    Sit to Stand Details Tactile cues for sequencing;Tactile cues for posture;Visual cues for safe use of DME/AE;Visual cues/gestures for sequencing          Education - 06/26/14 1124    Education provided Yes   Education Details increase wear of prostheses to all awake hours with drying limb/liner q2-3 hours, supervision for safety for all gait outside home   Education Details Patient;Spouse   Methods Explanation   Comprehension Verbalized understanding;Need further instruction            PT Long Term Goals - 06/26/14 1032    Title Patient verbalizes /demonstrates proper prosthetic care.   Time 18   Period Days   Status New   Title patient tolerates wear of prostheses >90% of awake hours without change in skin integrity nor undue tenderness   Time 18   Period Days   Status New   Title Patient ambulate 592ft with LRAD & prostheses modified independent.   Time 18   Period Days   Status New   Title Patient ambulate 200' on uneven (grass) surfaces with LRAD & prostheses modified independent.   Time 18   Period Days   Status New   Title Patient negotiate ramp, curb, stairs with LRAD & prostheses modified independent.   Time 18   Period Days   Status New   Additional Long  Term Goals Yes   Title Patient perform standing activities for > 20 minutes & prostheses wtih no pain or discomfort   Time 18   Period Days   Status New   Title Berg Balance Test score with prostheses >/= 45/56   Time 18   Period Days   Status New   Title self-report via FOTO improvement >10points in Functional Status Measure   Time 18   Period Days   Status New  Plan - 06/26/14 1127    Clinical Impression Statement Patient is improving with forearm crutch gait quality with assistance /cueing. When he fatigues, he requires increased cueing for safety   Pt will benefit from skilled therapeutic intervention in order to improve on the following deficits Abnormal gait;Decreased activity tolerance;Decreased balance;Decreased knowledge of use of DME;Decreased mobility;Decreased safety awareness;Other (comment)  prostheses use   Rehab Potential Good   Clinical Impairments Affecting Rehab Potential Patient needs increased cues with fatigue.   PT Frequency Min 2X/week   PT Duration Other (comment)  3 weeks   PT Treatment/Interventions DME instruction;Gait training;Stair training;Functional mobility training;Therapeutic activities;Balance training;Neuromuscular re-education;Patient/family education;Other (comment)  Prosthetic Training   PT Plan Plan to renew at end of cerification period. Prosthetic gait on barriers with forearm crutches        Problem List Patient Active Problem List   Diagnosis Date Noted  . S/P bilateral BKA (below knee amputation) 02/02/2014  . Osteomyelitis of right foot 01/30/2014  . 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 01/04/2014  . Osteomyelitis of toe of right foot 01/04/2014  . S/P Lt BKA 11/09/13 11/14/2013  . Acute blood loss anemia 11/11/2013  . Type II or unspecified type diabetes mellitus 11/09/2013  . Lower limb amputation, great toe 10/11/2013  . Lower limb amputation, other toe(s) 10/11/2013  . Chronic osteomyelitis of toe of left  foot 10/04/2013  . Foot osteomyelitis, left 10/04/2013  . Uncontrolled pain, Lt toe 09/21/2013  . Gangrenous toe, Lt toe 09/21/2013  . PAD (peripheral artery disease) 09/13/2013  . Diabetes mellitus 09/13/2013  . Hyperlipidemia 09/13/2013  . PAF (paroxysmal atrial fibrillation) 09/13/2013  . Critical lower limb ischemia- s/p Rt anterior tibial PTA 12/29/13 in preparation for Rt BKA 08/30/2013  . 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 HTN (hypertension) 04/08/2011  . NICM- EF 20-25% echo 6/14 09/29/2008  . LBBB 09/29/2008  . SYSTOLIC HEART FAILURE, CHRONIC 09/29/2008                                            Makya Yurko 06/26/2014, 11:36 AM

## 2014-06-29 ENCOUNTER — Encounter: Payer: Medicare Other | Admitting: Physical Therapy

## 2014-07-03 ENCOUNTER — Ambulatory Visit: Payer: Medicare Other | Admitting: Physical Therapy

## 2014-07-03 ENCOUNTER — Encounter: Payer: Self-pay | Admitting: Physical Therapy

## 2014-07-03 DIAGNOSIS — R269 Unspecified abnormalities of gait and mobility: Secondary | ICD-10-CM

## 2014-07-03 DIAGNOSIS — Z89512 Acquired absence of left leg below knee: Secondary | ICD-10-CM | POA: Diagnosis not present

## 2014-07-03 DIAGNOSIS — Z89511 Acquired absence of right leg below knee: Secondary | ICD-10-CM

## 2014-07-03 DIAGNOSIS — R6889 Other general symptoms and signs: Secondary | ICD-10-CM

## 2014-07-03 NOTE — Therapy (Signed)
Physical Therapy Treatment  Patient Details  Name: [name] [name] MRN: [id number] Date of Birth: [birth year]  Encounter Date: 07/03/2014      PT End of Session - 07/03/14 1112    Visit Number 12   Number of Visits 17   Date for PT Re-Evaluation 07/14/14   PT Start Time 4098   PT Stop Time 1100   PT Time Calculation (min) 45 min   Equipment Utilized During Treatment Gait belt   Activity Tolerance Patient tolerated treatment well      Past Medical History  Diagnosis Date  . GERD (gastroesophageal reflux disease)   . Obesity   . Cardiomyopathy, nonischemic     a. Cath 2003: mild nonobstructive CAD, EF 25% at that time.  . Hypertension   . Chronic systolic CHF (congestive heart failure)     a. NICM EF 25% dating back to at least 2003.  [name] PAF (paroxysmal atrial fibrillation)     a. Noted on ICD interrogation 2012;  b. coumadin d/c'd 01/2013.  [name] NSVT (nonsustained ventricular tachycardia)     a. Noted on ICD interrogation in 2011.  [name] LBBB (left bundle branch block)   . Critical lower limb ischemia   . High cholesterol   . PAD (peripheral artery disease)     a. 08/2013 Periph Angio/PTA: Abd Ao nl, RLE- 3v runoff, PT diff dzs, AT 90p, LLE 2v runoff, PT 100, AT 59m (diamondback ORA/chocolate balloon PTA).  . Type II diabetes mellitus   . Uncontrolled pain, Lt toe 09/21/2013  . Gangrenous toe, Lt toe 09/21/2013  . CAD (coronary artery disease)     a. Nonobstructive by cath 09/2001.  [name] Automatic implantable cardioverter-defibrillator in situ     a. 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  . Dementia   . Arthritis   . History of blood transfusion   . Complication of anesthesia   . PONV (postoperative nausea and vomiting)     Past Surgical History  Procedure Laterality Date  . Lithotripsy  2001  . Cervical spine surgery  1994  . Cardiac defibrillator placement  06/2009    WITH GENERATOR REPLACED; BiV ICD  . US echocardiography  03/21/2008    EF  30-35%  . Cardiovascular stress test  03/20/2009    EF 33%  . Transluminal atherectomy tibial artery Left 09/12/2013  . Cardiac catheterization  10/01/2001    THERE WAS GLOBAL HYPOKINESIS AND EF 25%. THERE APPEARED TO BE GLOBAL DECREASE IN WALL MOTION  . Toe amputation  10/04/2013    LEFT GREAT TOE AND 4TH TOE   /   DR Erlinda Hong  . Amputation Left 10/04/2013    Procedure: LEFT GREAT TOE AND SECOND TOE AMPUTATION;  Surgeon: Marianna Payment, MD;  Location: [place];  Service: Orthopedics;  Laterality: Left;  . Colonoscopy    . Leg amputation below knee Left 11/09/2013    DR Erlinda Hong  . Amputation Left 11/09/2013    Procedure: LEFT AMPUTATION BELOW KNEE;  Surgeon: Marianna Payment, MD;  Location: [place];  Service: Orthopedics;  Laterality: Left;  . Coronary angioplasty    . Amputation Right 01/04/2014    Procedure: Doristine Devoid second and third toe amputation;  Surgeon: Marianna Payment, MD;  Location: [place];  Service: Orthopedics;  Laterality: Right;  . Amputation Right 01/30/2014    Procedure: RIGHT BELOW KNEE AMPUTATION;  Surgeon: Marianna Payment, MD;  Location: [place];  Service: Orthopedics;  Laterality: Right;    There  were no vitals taken for this visit.  Visit Diagnosis:  Abnormality of gait  Decreased activity tolerance  Status post bilateral below knee amputation          [facility] Adult PT Treatment/Exercise - 07/03/14 1050    Transfers   Sit to Stand 5: Supervision;4: Min assist  minimal assist to crutches & supervision to RW   Stand to Sit 5: Supervision;4: Min assist  minimal assist to crutches & supervision to RW   Ambulation/Gait   Ambulation/Gait Assistance 4: Min guard;Other (comment)  min gaurd with crutches & supervision with RW   Ambulation Distance (Feet) 55 Feet  55 with forearm crutches & 537' with RW   Assistive device Lofstrands;Rolling walker   Gait Pattern Step-through pattern;Trunk flexed   Ramp 5: Supervision  with rolling walker & bilateral prostheses   Curb 4: Min  assist;Other (comment)  rolling walker & bilateral prostheses   Prosthetics   Current prosthetic wear tolerance (#hours/day)  continue with all awake hours except remove left prosthesis if continues to decrease pain.          Education - 07/03/14 1111    Education provided Yes   Education Details discuss wrist pain with MD.   Education Details Patient;Spouse   Methods Explanation   Comprehension Verbalized understanding              Plan - 07/03/14 1114    Clinical Impression Statement Patient appears to be safer & less pain on walker vs crutches for prosthetic gait.   Pt will benefit from skilled therapeutic intervention in order to improve on the following deficits Abnormal gait;Decreased activity tolerance;Decreased balance;Decreased endurance;Decreased knowledge of use of DME;Pain   Rehab Potential Good   Clinical Impairments Affecting Rehab Potential Patient appears may meet LTGs next week with use of walker & prostheses.   PT Frequency 1x / week  patient reduced frequency to 1x/wk from 2x/wk due to high co-pay   PT Duration --  07/14/14   PT Treatment/Interventions Gait training;Stair training;Therapeutic activities;Balance training;Other (comment)  prosthetic training   Recommended Other Services discuss wrist pain with MD   Consulted and Agree with Plan of Care Patient;Family member/caregiver   PT Plan Assess for discharge with rolling walker & prostheses        Problem List Patient Active Problem List   Diagnosis Date Noted  . S/P bilateral BKA (below knee amputation) 02/02/2014  . Osteomyelitis of right foot 01/30/2014  . 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 01/04/2014  . Osteomyelitis of toe of right foot 01/04/2014  . S/P Lt BKA 11/09/13 11/14/2013  . Acute blood loss anemia 11/11/2013  . Type II or unspecified type diabetes mellitus 11/09/2013  . Lower limb amputation, great toe 10/11/2013  . Lower limb amputation, other toe(s) 10/11/2013  .  Chronic osteomyelitis of toe of left foot 10/04/2013  . Foot osteomyelitis, left 10/04/2013  . Uncontrolled pain, Lt toe 09/21/2013  . Gangrenous toe, Lt toe 09/21/2013  . PAD (peripheral artery disease) 09/13/2013  . Diabetes mellitus 09/13/2013  . Hyperlipidemia 09/13/2013  . PAF (paroxysmal atrial fibrillation) 09/13/2013  . Critical lower limb ischemia- s/p Rt anterior tibial PTA 12/29/13 in preparation for Rt BKA 08/30/2013  . 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 HTN (hypertension) 04/08/2011  . NICM- EF 20-25% echo 6/14 09/29/2008  . LBBB 09/29/2008  . SYSTOLIC HEART FAILURE, CHRONIC 09/29/2008  Silverio Hagan 07/03/2014, 11:29 AM

## 2014-07-06 ENCOUNTER — Encounter: Payer: Medicare Other | Admitting: Physical Therapy

## 2014-07-10 ENCOUNTER — Encounter: Payer: Self-pay | Admitting: Physical Therapy

## 2014-07-10 ENCOUNTER — Ambulatory Visit: Payer: Medicare Other | Admitting: Physical Therapy

## 2014-07-10 DIAGNOSIS — R269 Unspecified abnormalities of gait and mobility: Secondary | ICD-10-CM

## 2014-07-10 DIAGNOSIS — R6889 Other general symptoms and signs: Secondary | ICD-10-CM

## 2014-07-10 DIAGNOSIS — Z89512 Acquired absence of left leg below knee: Secondary | ICD-10-CM | POA: Diagnosis not present

## 2014-07-10 NOTE — Therapy (Signed)
Physical Therapy Treatment  Patient Details  Name: [name] [name] MRN: [id number] Date of Birth: 1945-11-21  Encounter Date: 07/10/2014      PT End of Session - 07/10/14 1100    Visit Number 13   Number of Visits 17   Date for PT Re-Evaluation 07/14/14   PT Start Time 1540   PT Stop Time 1100   PT Time Calculation (min) 45 min   Equipment Utilized During Treatment Gait belt   Activity Tolerance Patient tolerated treatment well   Behavior During Therapy WFL for tasks assessed/performed      Past Medical History  Diagnosis Date  . GERD (gastroesophageal reflux disease)   . Obesity   . Cardiomyopathy, nonischemic     a. Cath 2003: mild nonobstructive CAD, EF 25% at that time.  . Hypertension   . Chronic systolic CHF (congestive heart failure)     a. NICM EF 25% dating back to at least 2003.  [name] PAF (paroxysmal atrial fibrillation)     a. Noted on ICD interrogation 2012;  b. coumadin d/c'd 01/2013.  [name] NSVT (nonsustained ventricular tachycardia)     a. Noted on ICD interrogation in 2011.  [name] LBBB (left bundle branch block)   . Critical lower limb ischemia   . High cholesterol   . PAD (peripheral artery disease)     a. 08/2013 Periph Angio/PTA: Abd Ao nl, RLE- 3v runoff, PT diff dzs, AT 90p, LLE 2v runoff, PT 100, AT 63m (diamondback ORA/chocolate balloon PTA).  . Type II diabetes mellitus   . Uncontrolled pain, Lt toe 09/21/2013  . Gangrenous toe, Lt toe 09/21/2013  . CAD (coronary artery disease)     a. Nonobstructive by cath 09/2001.  [name] Automatic implantable cardioverter-defibrillator in situ     a. 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  . Dementia   . Arthritis   . History of blood transfusion   . Complication of anesthesia   . PONV (postoperative nausea and vomiting)     Past Surgical History  Procedure Laterality Date  . Lithotripsy  2001  . Cervical spine surgery  1994  . Cardiac defibrillator placement  06/2009    WITH  GENERATOR REPLACED; BiV ICD  . US echocardiography  03/21/2008    EF 30-35%  . Cardiovascular stress test  03/20/2009    EF 33%  . Transluminal atherectomy tibial artery Left 09/12/2013  . Cardiac catheterization  10/01/2001    THERE WAS GLOBAL HYPOKINESIS AND EF 25%. THERE APPEARED TO BE GLOBAL DECREASE IN WALL MOTION  . Toe amputation  10/04/2013    LEFT GREAT TOE AND 4TH TOE   /   DR Erlinda Hong  . Amputation Left 10/04/2013    Procedure: LEFT GREAT TOE AND SECOND TOE AMPUTATION;  Surgeon: Marianna Payment, MD;  Location: [place];  Service: Orthopedics;  Laterality: Left;  . Colonoscopy    . Leg amputation below knee Left 11/09/2013    DR Erlinda Hong  . Amputation Left 11/09/2013    Procedure: LEFT AMPUTATION BELOW KNEE;  Surgeon: Marianna Payment, MD;  Location: [place];  Service: Orthopedics;  Laterality: Left;  . Coronary angioplasty    . Amputation Right 01/04/2014    Procedure: Doristine Devoid second and third toe amputation;  Surgeon: Marianna Payment, MD;  Location: [place];  Service: Orthopedics;  Laterality: Right;  . Amputation Right 01/30/2014    Procedure: RIGHT BELOW KNEE AMPUTATION;  Surgeon: Marianna Payment, MD;  Location: [place];  Service: Orthopedics;  Laterality: Right;    There were no vitals taken for this visit.  Visit Diagnosis:  Abnormality of gait  Decreased activity tolerance      Subjective Assessment - 07/10/14 1021    Symptoms No new complaints. Right wrist feels a little better. Has his wrist splints on today.   Currently in Pain? Yes   Pain Score 6    Pain Location Wrist  left more than right   Pain Orientation Right;Left   Pain Descriptors / Indicators Sore;Shooting;Pins and needles   Pain Type Other (Comment);Chronic pain   Pain Onset More than a month ago   Aggravating Factors  weight bearing/pressure through arms   Pain Relieving Factors wrist spints help, rest            [facility] Adult PT Treatment/Exercise - 07/10/14 1024    Transfers   Sit to Stand 5:  Supervision;6: Modified independent (Device/Increase time)   Sit to Stand Details (indicate cue type and reason) needs UE support and incresase   Stand to Sit 5: Supervision;6: Modified independent (Device/Increase time)   Stand to Sit Details needs UE's to control descent with sitting.   Ambulation/Gait   Ambulation/Gait Yes   Ambulation/Gait Assistance 5: Supervision;6: Modified independent (Device/Increase time)   Ambulation/Gait Assistance Details occasional cues on posture and walker proximity with gait over indoor level and outdoor paved/grass surfaces.                Ambulation Distance (Feet) --  >500 total: indoor level, outdoor paved/grass   Assistive device Rolling walker   Gait Pattern Step-through pattern;Decreased stride length;Trunk flexed   Gait velocity decreased   Ramp 5: Supervision   Ramp Details (indicate cue type and reason) cues on posture and rw position with gait   Curb 5: Supervision   Curb Details (indicate cue type and reason) cues on posture and walker position with gait   Berg Balance Test   Sit to Stand Able to stand  independently using hands   Standing Unsupported Able to stand 30 seconds unsupported   Sitting with Back Unsupported but Feet Supported on Floor or Stool Able to sit safely and securely 2 minutes   Stand to Sit Controls descent by using hands   Transfers Able to transfer safely, definite need of hands   Standing Unsupported with Eyes Closed Able to stand 10 seconds with supervision   Standing Ubsupported with Feet Together Needs help to attain position but able to stand for 30 seconds with feet together   From Standing, Reach Forward with Outstretched Arm Can reach forward >5 cm safely (2")   From Standing Position, Pick up Object from Floor Unable to pick up shoe, but reaches 2-5 cm (1-2") from shoe and balances independently   From Standing Position, Turn to Look Behind Over each Shoulder Turn sideways only but maintains balance   Turn 360  Degrees Needs assistance while turning   Standing Unsupported, Alternately Place Feet on Step/Stool Needs assistance to keep from falling or unable to try   Standing Unsupported, One Foot in Front Able to take small step independently and hold 30 seconds   Standing on One Leg Unable to try or needs assist to prevent fall   Total Score 27   Prosthetics   Current prosthetic wear tolerance (days/week)  7   Current prosthetic wear tolerance (#hours/day)  >90% of awake hours.   Residual limb condition  instact per pt/spouse report   Donning Prosthesis Supervision;Minimal assist  occasionally needs spouse assist   Doffing Prosthesis Supervision;Minimal assist  occasionally needs spouse assist              PT Long Term Goals - 07/10/14 1336    PT LONG TERM GOAL #1   Title Patient verbalizes /demonstrates proper prosthetic care.   Status Achieved   PT LONG TERM GOAL #2   Title patient tolerates wear of prostheses >90% of awake hours without change in skin integrity nor undue tenderness   Status Achieved   PT LONG TERM GOAL #3   Title Patient ambulate 548ft with LRAD & prostheses modified independent.   Status Achieved   PT LONG TERM GOAL #4   Title Patient ambulate 200' on uneven (grass) surfaces with LRAD & prostheses modified independent.   Status Achieved   PT LONG TERM GOAL #5   Title Patient negotiate ramp, curb, stairs with LRAD & prostheses modified independent.   Status Achieved   PT LONG TERM GOAL #6   Title Patient perform standing activities for > 20 minutes & prostheses wtih no pain or discomfort   Status Achieved   PT LONG TERM GOAL #7   Title Berg Balance Test score with prostheses >/= 45/56   Status Not Met   PT LONG TERM GOAL #8   Title self-report via FOTO improvement >10points in Functional Status Measure   Status Unable to assess          Plan - 07/10/14 1335    Clinical Impression Statement Discharge today with pt using RW/prostheses for mobility.    Pt will benefit from skilled therapeutic intervention in order to improve on the following deficits Abnormal gait;Decreased activity tolerance;Decreased balance;Decreased endurance;Decreased knowledge of use of DME;Pain   Rehab Potential Good   PT Frequency 1x / week   PT Duration 4 weeks   PT Treatment/Interventions Gait training;Stair training;Therapeutic activities;Balance training;Other (comment)  prosthetic training   PT Next Visit Plan Discharge today.   Consulted and Agree with Plan of Care Patient;Family member/caregiver        Problem List Patient Active Problem List   Diagnosis Date Noted  . S/P bilateral BKA (below knee amputation) 02/02/2014  . Osteomyelitis of right foot 01/30/2014  . 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 01/04/2014  . Osteomyelitis of toe of right foot 01/04/2014  . S/P Lt BKA 11/09/13 11/14/2013  . Acute blood loss anemia 11/11/2013  . Type II or unspecified type diabetes mellitus 11/09/2013  . Lower limb amputation, great toe 10/11/2013  . Lower limb amputation, other toe(s) 10/11/2013  . Chronic osteomyelitis of toe of left foot 10/04/2013  . Foot osteomyelitis, left 10/04/2013  . Uncontrolled pain, Lt toe 09/21/2013  . Gangrenous toe, Lt toe 09/21/2013  . PAD (peripheral artery disease) 09/13/2013  . Diabetes mellitus 09/13/2013  . Hyperlipidemia 09/13/2013  . PAF (paroxysmal atrial fibrillation) 09/13/2013  . Critical lower limb ischemia- s/p Rt anterior tibial PTA 12/29/13 in preparation for Rt BKA 08/30/2013  . 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 HTN (hypertension) 04/08/2011  . NICM- EF 20-25% echo 6/14 09/29/2008  . LBBB 09/29/2008  . SYSTOLIC HEART FAILURE, CHRONIC 09/29/2008         Willow Ora 07/10/2014, 1:40 PM Willow Ora, PTA, [facility] [address], [place], [place] [address] [phone] 07/10/2014, 1:47 PM

## 2014-07-11 ENCOUNTER — Encounter: Payer: Self-pay | Admitting: Physical Therapy

## 2014-07-11 NOTE — Therapy (Unsigned)
  Patient Details  Name: [name] [name] MRN: [id number] Date of Birth: 1946/01/22 Referring Diagnosis: Bilateral Transtibial Amputations Date on Onset: 05/12/14 Treatment Diagnosis: Prosthetic dependency, abnormality of gait, activity intolerance Dates of Service: 05/16/14-07/10/14 Encounter Date: 07/11/2014 PHYSICAL THERAPY DISCHARGE SUMMARY  Visits from Start of Care:   Current functional level related to goals / functional outcomes:  PT LONG TERM GOAL #1    Title  Patient verbalizes /demonstrates proper prosthetic care.    Status  Achieved    PT LONG TERM GOAL #2    Title  patient tolerates wear of prostheses >90% of awake hours without change in skin integrity nor undue tenderness    Status  Achieved    PT LONG TERM GOAL #3    Title  Patient ambulate 535f with LRAD & prostheses modified independent.    Status  Achieved with rolling walker    PT LONG TERM GOAL #4    Title  Patient ambulate 200' on uneven (grass) surfaces with LRAD & prostheses modified independent.    Status  Achieved with rolling walker    PT LONG TERM GOAL #5    Title  Patient negotiate ramp, curb, stairs with LRAD & prostheses modified independent.    Status  Achieved with rolling walker    PT LONG TERM GOAL #6    Title  Patient perform standing activities for > 20 minutes & prostheses wtih no pain or discomfort    Status  Achieved with rolling walker    PT LONG TERM GOAL #7    Title  Berg Balance Test score with prostheses >/= 45/ 56     Status  Not Met- Berg Balance 27/56    PT LONG TERM GOAL #8    Title  self-report via FOTO improvement >10points in Functional Status Measure    Status  Unable to assess - Staff forgot to retest    with survey on last visit          Remaining deficits: Patient requires bilateral UE support for gait /mobility with bilateral prostheses. He is safe functioning with rolling walker & bilateral prostheses at  basic community level (K2). He has weakness associated with prolonged limited activities. His wife & he verbalize understanding how to progress activity tolerance.   Education / Equipment: Patient and wife were instructed in safe prosthetic use /care and HEP /activity progression. Plan: Patient agrees to discharge.  Patient goals were partially met. Patient is being discharged due to meeting the stated rehab goals.  And limited progress in last month. Please refer back to PT in 3-4 months when patient is able to improve endurance & strength with HEP.?        WCotter DPT [phone]/17/2015, 9:03 AM

## 2014-07-12 ENCOUNTER — Other Ambulatory Visit: Payer: Self-pay | Admitting: *Deleted

## 2014-07-12 NOTE — Telephone Encounter (Signed)
Fax refill request denied in epic for carvedilol which was ordered at discharge from inpatient rehab by Marlowe Shores PA but is to be refilled by PCP or cardiologist

## 2014-07-12 NOTE — Telephone Encounter (Signed)
Electronic request denied due to Carvedilol being a cardiac medication, requested that Rx be forwarded to PCP or cardiologist

## 2014-07-13 ENCOUNTER — Ambulatory Visit: Payer: Medicare Other | Admitting: Physical Therapy

## 2014-07-17 ENCOUNTER — Other Ambulatory Visit: Payer: Self-pay | Admitting: *Deleted

## 2014-07-18 ENCOUNTER — Ambulatory Visit: Payer: Medicare Other | Admitting: Physical Therapy

## 2014-07-26 ENCOUNTER — Encounter: Payer: Medicare Other | Admitting: Physical Therapy

## 2014-08-02 ENCOUNTER — Encounter: Payer: Medicare Other | Admitting: Physical Therapy

## 2014-08-03 ENCOUNTER — Encounter (HOSPITAL_COMMUNITY): Payer: Self-pay | Admitting: Cardiovascular Disease

## 2014-08-09 ENCOUNTER — Encounter: Payer: Medicare Other | Admitting: Physical Therapy

## 2014-08-14 ENCOUNTER — Encounter: Payer: Medicare Other | Admitting: Physical Therapy

## 2014-08-21 ENCOUNTER — Encounter: Payer: Medicare Other | Admitting: Physical Therapy

## 2014-08-22 ENCOUNTER — Encounter: Payer: Self-pay | Admitting: *Deleted

## 2014-08-28 ENCOUNTER — Encounter: Payer: Medicare Other | Admitting: Physical Therapy

## 2014-09-04 ENCOUNTER — Encounter: Payer: Medicare Other | Admitting: Physical Therapy

## 2014-09-11 ENCOUNTER — Encounter: Payer: Medicare Other | Admitting: Physical Therapy

## 2014-10-23 ENCOUNTER — Ambulatory Visit: Payer: Self-pay | Admitting: Internal Medicine

## 2014-10-24 ENCOUNTER — Ambulatory Visit (INDEPENDENT_AMBULATORY_CARE_PROVIDER_SITE_OTHER): Payer: Medicare Other | Admitting: Internal Medicine

## 2014-10-24 ENCOUNTER — Encounter: Payer: Self-pay | Admitting: Internal Medicine

## 2014-10-24 VITALS — BP 114/51 | HR 75 | Ht 69.0 in | Wt 205.0 lb

## 2014-10-24 DIAGNOSIS — I429 Cardiomyopathy, unspecified: Secondary | ICD-10-CM

## 2014-10-24 DIAGNOSIS — I480 Paroxysmal atrial fibrillation: Secondary | ICD-10-CM

## 2014-10-24 DIAGNOSIS — Z4502 Encounter for adjustment and management of automatic implantable cardiac defibrillator: Secondary | ICD-10-CM

## 2014-10-24 DIAGNOSIS — I428 Other cardiomyopathies: Secondary | ICD-10-CM

## 2014-10-24 LAB — MDC_IDC_ENUM_SESS_TYPE_INCLINIC
Battery Remaining Longevity: 60 mo
Date Time Interrogation Session: 20160301050000
HIGH POWER IMPEDANCE MEASURED VALUE: 45 Ohm
HighPow Impedance: 36 Ohm
Lead Channel Impedance Value: 514 Ohm
Lead Channel Impedance Value: 534 Ohm
Lead Channel Pacing Threshold Amplitude: 0.5 V
Lead Channel Pacing Threshold Amplitude: 0.8 V
Lead Channel Pacing Threshold Pulse Width: 0.4 ms
Lead Channel Pacing Threshold Pulse Width: 0.4 ms
Lead Channel Sensing Intrinsic Amplitude: 10.5 mV
Lead Channel Sensing Intrinsic Amplitude: 10.5 mV
Lead Channel Setting Pacing Amplitude: 2 V
Lead Channel Setting Pacing Amplitude: 2.4 V
Lead Channel Setting Pacing Amplitude: 2.4 V
Lead Channel Setting Pacing Pulse Width: 0.4 ms
Lead Channel Setting Pacing Pulse Width: 0.4 ms
Lead Channel Setting Sensing Sensitivity: 0.5 mV
Lead Channel Setting Sensing Sensitivity: 1 mV
MDC IDC MSMT LEADCHNL LV PACING THRESHOLD AMPLITUDE: 1 V
MDC IDC MSMT LEADCHNL RA PACING THRESHOLD PULSEWIDTH: 0.4 ms
MDC IDC MSMT LEADCHNL RA SENSING INTR AMPL: 6.2 mV
MDC IDC MSMT LEADCHNL RV IMPEDANCE VALUE: 481 Ohm
MDC IDC PG SERIAL: 587847
MDC IDC STAT BRADY RV PERCENT PACED: 99 %
Zone Setting Detection Interval: 250 ms
Zone Setting Detection Interval: 300 ms
Zone Setting Detection Interval: 343 ms

## 2014-10-24 LAB — BASIC METABOLIC PANEL
BUN: 24 mg/dL — ABNORMAL HIGH (ref 6–23)
CALCIUM: 9.2 mg/dL (ref 8.4–10.5)
CO2: 24 meq/L (ref 19–32)
CREATININE: 1.15 mg/dL (ref 0.40–1.50)
Chloride: 109 mEq/L (ref 96–112)
GFR: 80.62 mL/min (ref >60.00)
Glucose, Bld: 165 mg/dL — ABNORMAL HIGH (ref 70–99)
POTASSIUM: 4.5 meq/L (ref 3.5–5.1)
Sodium: 139 mEq/L (ref 135–145)

## 2014-10-24 MED ORDER — ASPIRIN 81 MG PO TBEC: 81.0000 mg | DELAYED_RELEASE_TABLET | Freq: Every day | ORAL | Status: DC

## 2014-10-24 NOTE — Progress Notes (Signed)
Patient Care Team: Josetta Huddle, MD as PCP - General (Internal Medicine)   HPI  [name] [name] is a 69 y.o. male  seen as a followup as per MADIT-CRT trial implanted for a nonischemic cardiomyopathy with a CRT-D device. He is status post generator replacement   He has intercurrently undergone bilateral BKA and is walking with bilateral prosthetic limbs  He denies shortness of breath   Echocardiogram 6/14 demonstrated ejection fraction of 25%     Past Medical History  Diagnosis Date  . GERD (gastroesophageal reflux disease)   . Obesity   . Cardiomyopathy, nonischemic     a. Cath 2003: mild nonobstructive CAD, EF 25% at that time.  . Hypertension   . Chronic systolic CHF (congestive heart failure)     a. NICM EF 25% dating back to at least 2003.  [name] PAF (paroxysmal atrial fibrillation)     a. Noted on ICD interrogation 2012;  b. coumadin d/c'd 01/2013.  [name] NSVT (nonsustained ventricular tachycardia)     a. Noted on ICD interrogation in 2011.  [name] LBBB (left bundle branch block)   . Critical lower limb ischemia   . High cholesterol   . PAD (peripheral artery disease)     a. 08/2013 Periph Angio/PTA: Abd Ao nl, RLE- 3v runoff, PT diff dzs, AT 90p, LLE 2v runoff, PT 100, AT 91m (diamondback ORA/chocolate balloon PTA).  . Type II diabetes mellitus   . Uncontrolled pain, Lt toe 09/21/2013  . Gangrenous toe, Lt toe 09/21/2013  . CAD (coronary artery disease)     a. Nonobstructive by cath 09/2001.  [name] Automatic implantable cardioverter-defibrillator in situ     a. 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  . Dementia   . Arthritis   . History of blood transfusion   . Complication of anesthesia   . PONV (postoperative nausea and vomiting)     Past Surgical History  Procedure Laterality Date  . Lithotripsy  2001  . Cervical spine surgery  1994  . Cardiac defibrillator placement  06/2009    WITH GENERATOR REPLACED; BiV ICD  . US  echocardiography  03/21/2008    EF 30-35%  . Cardiovascular stress test  03/20/2009    EF 33%  . Transluminal atherectomy tibial artery Left 09/12/2013  . Cardiac catheterization  10/01/2001    THERE WAS GLOBAL HYPOKINESIS AND EF 25%. THERE APPEARED TO BE GLOBAL DECREASE IN WALL MOTION  . Toe amputation  10/04/2013    LEFT GREAT TOE AND 4TH TOE   /   DR Erlinda Hong  . Amputation Left 10/04/2013    Procedure: LEFT GREAT TOE AND SECOND TOE AMPUTATION;  Surgeon: Marianna Payment, MD;  Location: [facility];  Service: Orthopedics;  Laterality: Left;  . Colonoscopy    . Leg amputation below knee Left 11/09/2013    DR Erlinda Hong  . Amputation Left 11/09/2013    Procedure: LEFT AMPUTATION BELOW KNEE;  Surgeon: Marianna Payment, MD;  Location: [facility];  Service: Orthopedics;  Laterality: Left;  . Coronary angioplasty    . Amputation Right 01/04/2014    Procedure: Doristine Devoid second and third toe amputation;  Surgeon: Marianna Payment, MD;  Location: [facility];  Service: Orthopedics;  Laterality: Right;  . Amputation Right 01/30/2014    Procedure: RIGHT BELOW KNEE AMPUTATION;  Surgeon: Marianna Payment, MD;  Location: [facility];  Service: Orthopedics;  Laterality: Right;  . Lower extremity angiogram Bilateral 09/12/2013    Procedure:  LOWER EXTREMITY ANGIOGRAM;  Surgeon: Lorretta Harp, MD;  Location: [facility] CATH LAB;  Service: Cardiovascular;  Laterality: Bilateral;  . Abdominal angiogram  09/12/2013    Procedure: ABDOMINAL ANGIOGRAM;  Surgeon: Lorretta Harp, MD;  Location: [facility] CATH LAB;  Service: Cardiovascular;;  . Lower extremity angiogram N/A 12/29/2013    Procedure: LOWER EXTREMITY ANGIOGRAM;  Surgeon: Lorretta Harp, MD;  Location: [facility] CATH LAB;  Service: Cardiovascular;  Laterality: N/A;  . Atherectomy Right 12/29/2013    Procedure: ATHERECTOMY;  Surgeon: Lorretta Harp, MD;  Location: [facility] CATH LAB;  Service: Cardiovascular;  Laterality: Right;  Anterior Tibial Artery    Current Outpatient Prescriptions  Medication Sig Dispense  Refill  . aspirin 325 MG EC tablet Take 1 tablet (325 mg total) by mouth daily. 30 tablet 0  . atorvastatin (LIPITOR) 10 MG tablet Take 1 tablet (10 mg total) by mouth daily. 30 tablet 1  . carvedilol (COREG) 12.5 MG tablet Take 1 tablet (12.5 mg total) by mouth 2 (two) times daily with a meal. 60 tablet 1  . cholecalciferol (VITAMIN D) 1000 UNITS tablet Take 1,000 Units by mouth daily.    . clopidogrel (PLAVIX) 75 MG tablet Take 1 tablet (75 mg total) by mouth daily with breakfast. 30 tablet 1  . esomeprazole (NEXIUM) 40 MG capsule Take 1 capsule (40 mg total) by mouth daily as needed (acid reflux). 30 capsule 1  . furosemide (LASIX) 40 MG tablet Take 1 tablet (40 mg total) by mouth 2 (two) times daily. 60 tablet 1  . insulin detemir (LEVEMIR) 100 UNIT/ML injection 25 units subcutaneous daily and 35 units subcutaneous each bedtime 10 mL 11  . iron polysaccharides (NIFEREX) 150 MG capsule Take 1 capsule (150 mg total) by mouth daily. 30 capsule 1  . Multiple Vitamin (MULTIVITAMIN WITH MINERALS) TABS tablet Take 1 tablet by mouth daily.    . potassium chloride SA (K-DUR,KLOR-CON) 20 MEQ tablet Take 1 tablet (20 mEq total) by mouth 2 (two) times daily. 60 tablet 1  . pregabalin (LYRICA) 75 MG capsule Take 1 capsule (75 mg total) by mouth 2 (two) times daily. 60 capsule 1  . senna-docusate (SENOKOT-S) 8.6-50 MG per tablet Take 1 tablet by mouth 2 (two) times daily. 60 tablet 1  . spironolactone (ALDACTONE) 25 MG tablet Take 0.5 tablets (12.5 mg total) by mouth at bedtime. 30 tablet 1  . vitamin E (VITAMIN E) 1000 UNIT capsule Take 1,000 Units by mouth daily.     No current facility-administered medications for this visit.    No Known Allergies  Review of Systems negative except from HPI and PMH  Physical Exam BP 114/51 mmHg  Pulse 75  Ht 5\' 9"  (1.753 m)  Wt 205 lb (92.987 kg)  BMI 30.26 kg/m2 Well developed and nourished in no acute distress HENT normal Neck supple with  JVP-flat Clear Device pocket well healed; without hematoma or erythema.  There is no tethering Regular rate and rhythm, 2/6 murmurAbd-soft with active BS No Clubbing cyanosis B prothesis Skin-warm and dry A & Oriented  Grossly normal sensory and motor function   ECG demonstrates P synchronous pacing  Assessment and  Plan  Atrial fibrillation-paroxysmal  Congestive heart failure-chronic-systolic  Nonischemic cardio myopathy  Implantable defibrillator-CRT-[employer]   The patient's device was interrogated.  The information was reviewed. No changes were made in the programming.     The patient is maintaining sinus rhythm;  he is on aspirin 325. We'll decrease this to 81. Reviewing  his chart he was started on Plavix January 2015 in the setting of lower limb ischemia. Will have to touch base with Dr. Hiram Comber as to whether this is necessary to continue now following his BKA  He should be back on anticoagulation; this was apparently stopped in hospital with at the time of his amputations.  We have discussed the role of the NOACs.; Renal function was reasonably normal J June 2015.    He is euvolemic.  No intercurrent ventricular tachycardia  He is euvolemic.

## 2014-10-24 NOTE — Patient Instructions (Addendum)
Your physician has recommended you make the following change in your medication:  1) DECREASE Aspirin to 81 mg daily  Lab today: BMET  Remote monitoring is used to monitor your Pacemaker of ICD from home. This monitoring reduces the number of office visits required to check your device to one time per year. It allows [place] to keep an eye on the functioning of your device to ensure it is working properly. You are scheduled for a device check from home on 01/23/15. You may send your transmission at any time that day. If you have a wireless device, the transmission will be sent automatically. After your physician reviews your transmission, you will receive a postcard with your next transmission date.  Your physician wants you to follow-up in: 1 year with Dr. Caryl Comes.  You will receive a reminder letter in the mail two months in advance. If you don't receive a letter, please call our office to schedule the follow-up appointment.  Please check with your pharmacy/insurance about cost of the following anticoagulants.  Please call us with the medication that will be most cost effective for you.  1) Pradaxa 2) Eliquis 3) Xarelto 4) Savaysa

## 2014-10-25 ENCOUNTER — Telehealth: Payer: Self-pay | Admitting: Internal Medicine

## 2014-10-25 NOTE — Telephone Encounter (Signed)
New Msg       Pt wife Enid Derry calling about medications Pradaxa, Eliquis, Xarelto, Savaysa.    Pt can't afford these medications.   Please return call.

## 2014-10-25 NOTE — Telephone Encounter (Signed)
They can't afford any of these medications.   Patient would like to start Coumadin. They are aware I will get approval from Dr. Caryl Comes and then be in touch with them to establish in Coumadin clinic.

## 2014-10-26 NOTE — Telephone Encounter (Signed)
[place] for [employer]

## 2014-10-27 MED ORDER — WARFARIN SODIUM 5 MG PO TABS: 10.0000 mg | ORAL_TABLET | Freq: Every day | ORAL | Status: DC

## 2014-10-27 NOTE — Telephone Encounter (Signed)
Informed patient's wife ok to re-start Coumadin. Discussed with coumadin clinic Gay Filler, pharm) who ordered to start 10 mg daily and follow up in 5 days in clinic. Advised wife of instructions and scheduled appt for next Wednesday. Rx sent to [facility]. Patient's wife verbalized understanding and agreeable to plan.

## 2014-11-01 ENCOUNTER — Ambulatory Visit (INDEPENDENT_AMBULATORY_CARE_PROVIDER_SITE_OTHER): Payer: Medicare Other | Admitting: *Deleted

## 2014-11-01 DIAGNOSIS — I4891 Unspecified atrial fibrillation: Secondary | ICD-10-CM

## 2014-11-01 DIAGNOSIS — Z5181 Encounter for therapeutic drug level monitoring: Secondary | ICD-10-CM

## 2014-11-01 LAB — POCT INR: INR: 1.3

## 2014-11-01 MED ORDER — WARFARIN SODIUM 5 MG PO TABS: 10.0000 mg | ORAL_TABLET | Freq: Every day | ORAL | Status: DC

## 2014-11-01 NOTE — Patient Instructions (Signed)

## 2014-11-03 ENCOUNTER — Encounter: Payer: Self-pay | Admitting: Internal Medicine

## 2014-11-09 ENCOUNTER — Ambulatory Visit (INDEPENDENT_AMBULATORY_CARE_PROVIDER_SITE_OTHER): Payer: Medicare Other | Admitting: Pharmacist Clinician (PhC)/ Clinical Pharmacy Specialist

## 2014-11-09 DIAGNOSIS — Z5181 Encounter for therapeutic drug level monitoring: Secondary | ICD-10-CM

## 2014-11-09 DIAGNOSIS — I4891 Unspecified atrial fibrillation: Secondary | ICD-10-CM

## 2014-11-09 LAB — POCT INR: INR: 2.8

## 2014-11-22 ENCOUNTER — Ambulatory Visit (INDEPENDENT_AMBULATORY_CARE_PROVIDER_SITE_OTHER): Payer: Medicare Other | Admitting: Surgery

## 2014-11-22 DIAGNOSIS — I4891 Unspecified atrial fibrillation: Secondary | ICD-10-CM | POA: Diagnosis not present

## 2014-11-22 DIAGNOSIS — Z5181 Encounter for therapeutic drug level monitoring: Secondary | ICD-10-CM | POA: Diagnosis not present

## 2014-11-22 LAB — POCT INR: INR: 1.3

## 2014-11-22 MED ORDER — WARFARIN SODIUM 10 MG PO TABS: ORAL_TABLET | ORAL | Status: DC

## 2014-11-29 ENCOUNTER — Ambulatory Visit (INDEPENDENT_AMBULATORY_CARE_PROVIDER_SITE_OTHER): Payer: Medicare Other | Admitting: Surgery

## 2014-11-29 DIAGNOSIS — I4891 Unspecified atrial fibrillation: Secondary | ICD-10-CM | POA: Diagnosis not present

## 2014-11-29 DIAGNOSIS — Z5181 Encounter for therapeutic drug level monitoring: Secondary | ICD-10-CM

## 2014-11-29 LAB — POCT INR: INR: 1.9

## 2014-12-08 ENCOUNTER — Ambulatory Visit (INDEPENDENT_AMBULATORY_CARE_PROVIDER_SITE_OTHER): Payer: Medicare Other | Admitting: *Deleted

## 2014-12-08 DIAGNOSIS — Z5181 Encounter for therapeutic drug level monitoring: Secondary | ICD-10-CM

## 2014-12-08 DIAGNOSIS — I4891 Unspecified atrial fibrillation: Secondary | ICD-10-CM

## 2014-12-08 LAB — POCT INR: INR: 1.7

## 2014-12-12 ENCOUNTER — Other Ambulatory Visit: Payer: Self-pay | Admitting: Internal Medicine

## 2014-12-18 ENCOUNTER — Other Ambulatory Visit (HOSPITAL_COMMUNITY): Payer: Self-pay | Admitting: Orthopaedic Surgery

## 2014-12-19 ENCOUNTER — Ambulatory Visit (INDEPENDENT_AMBULATORY_CARE_PROVIDER_SITE_OTHER): Payer: Medicare Other | Admitting: *Deleted

## 2014-12-19 DIAGNOSIS — I4891 Unspecified atrial fibrillation: Secondary | ICD-10-CM

## 2014-12-19 DIAGNOSIS — Z5181 Encounter for therapeutic drug level monitoring: Secondary | ICD-10-CM

## 2014-12-19 LAB — POCT INR: INR: 2.9

## 2014-12-21 ENCOUNTER — Encounter (HOSPITAL_COMMUNITY)
Admission: RE | Admit: 2014-12-21 | Discharge: 2014-12-21 | Disposition: A | Discharge status: Home / Self Care | Payer: Medicare Other | Source: Ambulatory Visit | Attending: Orthopaedic Surgery | Admitting: Orthopaedic Surgery

## 2014-12-21 ENCOUNTER — Encounter (HOSPITAL_COMMUNITY): Payer: Self-pay

## 2014-12-21 HISTORY — DX: Constipation, unspecified: K59.00

## 2014-12-21 HISTORY — DX: Polyneuropathy, unspecified: G62.9

## 2014-12-21 HISTORY — DX: Pneumonia, unspecified organism: J18.9

## 2014-12-21 HISTORY — DX: Personal history of urinary calculi: Z87.442

## 2014-12-21 HISTORY — DX: Anemia, unspecified: D64.9

## 2014-12-21 HISTORY — DX: Headache: R51

## 2014-12-21 HISTORY — DX: Frequency of micturition: R35.0

## 2014-12-21 HISTORY — DX: Headache, unspecified: R51.9

## 2014-12-21 HISTORY — DX: Urgency of urination: R39.15

## 2014-12-21 HISTORY — DX: Personal history of other diseases of the respiratory system: Z87.09

## 2014-12-21 LAB — CBC
HEMATOCRIT: 37.6 % — AB (ref 39.0–52.0)
Hemoglobin: 13 g/dL (ref 13.0–17.0)
MCH: 25.3 pg — AB (ref 26.0–34.0)
MCHC: 34.6 g/dL (ref 30.0–36.0)
MCV: 73.2 fL — AB (ref 78.0–100.0)
Platelets: 321 10*3/uL (ref 150–400)
RBC: 5.14 MIL/uL (ref 4.22–5.81)
RDW: 16.7 % — ABNORMAL HIGH (ref 11.5–15.5)
WBC: 4.7 10*3/uL (ref 4.0–10.5)

## 2014-12-21 LAB — BASIC METABOLIC PANEL
Anion gap: 8 (ref 5–15)
BUN: 29 mg/dL — AB (ref 6–23)
CO2: 20 mmol/L (ref 19–32)
CREATININE: 1.23 mg/dL (ref 0.50–1.35)
Calcium: 9.1 mg/dL (ref 8.4–10.5)
Chloride: 108 mmol/L (ref 96–112)
GFR calc Af Amer: 66 mL/min — ABNORMAL LOW (ref >90)
GFR calc non Af Amer: 57 mL/min — ABNORMAL LOW (ref >90)
Glucose, Bld: 272 mg/dL — ABNORMAL HIGH (ref 70–99)
Potassium: 4.5 mmol/L (ref 3.5–5.1)
Sodium: 136 mmol/L (ref 135–145)

## 2014-12-21 NOTE — Progress Notes (Addendum)
Medical Md is Dr.Robert Inda Merlin  Multiple echo reports in epic with most recent in 2014  EKG in epic from 31-16  CXR in epic from 01-30-14  Cardiologist is Dr. Caryl Comes and he manages defi  Stress test done > 47yrs ago  heart cath done in 2003

## 2014-12-21 NOTE — Progress Notes (Signed)
Notified [employer] rep about pts upcoming surgery on 01/03/15 @ 12:45 with arrival time of 1045

## 2014-12-21 NOTE — Progress Notes (Signed)
Average fasting blood sugar 115-130

## 2014-12-21 NOTE — Pre-Procedure Instructions (Signed)
[place]  12/21/2014   Your procedure is scheduled on:  Wed, May 11 @ 12:45 PM  Report to [facility] and report to Admitting at 10:45 AM.  Call this number if you have problems the morning of surgery: [phone]   Remember:   Do not eat food or drink liquids after midnight.   Take these medicines the morning of surgery with A SIP OF WATER: Carvedilol(Coreg),Nexium(Esomeprazole),and Lyrica(Pregabalin)              Stop taking your Coumadin as you have been instructed along with Plavix,Aspirin,and Vit E. No Goody's,BC's,Aleve,Ibuprofen,Fish Oil,or any Herbal Medications.    Do not wear jewelry.  Do not wear lotions, powders, or colognes. You may wear deodorant.  Men may shave face and neck.  Do not bring valuables to the hospital.  [facility] is not responsible                  for any belongings or valuables.               Contacts, dentures or bridgework may not be worn into surgery.  Leave suitcase in the car. After surgery it may be brought to your room.  For patients admitted to the hospital, discharge time is determined by your                treatment team.               Patients discharged the day of surgery will not be allowed to drive  home.    Special Instructions:  [facility] - Preparing for Surgery  Before surgery, you can play an important role.  Because skin is not sterile, your skin needs to be as free of germs as possible.  You can reduce the number of germs on you skin by washing with CHG (chlorahexidine gluconate) soap before surgery.  CHG is an antiseptic cleaner which kills germs and bonds with the skin to continue killing germs even after washing.  Please DO NOT use if you have an allergy to CHG or antibacterial soaps.  If your skin becomes reddened/irritated stop using the CHG and inform your nurse when you arrive at Short Stay.  Do not shave (including legs and underarms) for at least 48 hours prior to the first CHG shower.  You may shave  your face.  Please follow these instructions carefully:   1.  Shower with CHG Soap the night before surgery and the                                morning of Surgery.  2.  If you choose to wash your hair, wash your hair first as usual with your       normal shampoo.  3.  After you shampoo, rinse your hair and body thoroughly to remove the                      Shampoo.  4.  Use CHG as you would any other liquid soap.  You can apply chg directly       to the skin and wash gently with scrungie or a clean washcloth.  5.  Apply the CHG Soap to your body ONLY FROM THE NECK DOWN.        Do not use on open wounds or open sores.  Avoid contact with your eyes,  ears, mouth and genitals (private parts).  Wash genitals (private parts)       with your normal soap.  6.  Wash thoroughly, paying special attention to the area where your surgery        will be performed.  7.  Thoroughly rinse your body with warm water from the neck down.  8.  DO NOT shower/wash with your normal soap after using and rinsing off       the CHG Soap.  9.  Pat yourself dry with a clean towel.            10.  Wear clean pajamas.            11.  Place clean sheets on your bed the night of your first shower and do not        sleep with pets.  Day of Surgery  Do not apply any lotions/deoderants the morning of surgery.  Please wear clean clothes to the hospital/surgery center.     Please read over the following fact sheets that you were given: Pain Booklet, Coughing and Deep Breathing and Surgical Site Infection Prevention

## 2014-12-22 ENCOUNTER — Telehealth: Payer: Self-pay | Admitting: *Deleted

## 2014-12-22 NOTE — Progress Notes (Addendum)
Anesthesia Chart Review:  Pt is 69 year old male scheduled for  L BKA revision on 01/03/2015 with Dr. Erlinda Hong.   PCP is Dr. Josetta Huddle. Cardiologist is Dr. Caryl Comes, last office visit 10/24/14.   PMH includes: nonischemic cardiomyopathy, AICD Corporate investment banker), CAD, PAF, NSVT, chronic systolic HF, PAD (s/p PTA atherectomy 08/2013), HTN, DM, LBBB, high cholesterol, dementia, anemia. Never smoker. BMI 30. 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   Medications include: plavix, coumadin, carvedilol. Coumadin to be stopped 12/28/2014. Discussed plavix with Sherrie in Dr. Phoebe Sharps office; someone from Dr. Phoebe Sharps office will follow up with pt on plavix.   Preoperative labs reviewed.  PT/PTT to be obtained DOS. Glucose 272. Spoke with pt's wife, pt's caregiver, as pt has dementia. Pt's blood sugars are usually 120-200 fasting and 225-300 in the evening. Explained that if pt's blood sugar is over 200 when he presents DOS, surgery may be cancelled.   Chest x-ray 01/30/2014 reviewed. No acute cardiopulmonary disease.   EKG 10/24/2014: sinus rhythm with fusion complexes. RBBB. Lateral infarct, age undetermined. Inferior infarct, age undetermined. Dr. Olin Pia interpretation V synchronous pacing.   Echo 02/15/2013: - Left ventricle: The cavity size was mildly dilated. Wall thickness was normal. Systolic function was severely reduced. The estimated ejection fraction was in the range of 20% to 25%. Diffuse hypokinesis. Doppler parameters are consistent with abnormal left ventricular relaxation (grade 1 diastolic dysfunction). - Mitral valve: Mild regurgitation. - Left atrium: The atrium was mildly dilated.  Nuclear stress test 02/28/2009: -reduced LV systolic function with global hypokinesia and no evidence of ischemia or scar.   Cardiac cath 10/01/2001: 1. Severe global left ventricular dysfunction with elevated left ventricular filling  pressures. 2. Mild coronary atherosclerosis with 30-40% narrowing in the proximal left anterior descending and 30% narrowing in the left circumflex with  minimal disease in a small right coronary artery.  Perioperative prescription for ICD programming notes procedure should not interfere with device function; no device programming or magnet placement needed.   [name], FNP-BC [facility]/Anesthesiology Phone: [phone] 12/28/2014 1:47 PM

## 2014-12-22 NOTE — Telephone Encounter (Signed)
Per Dr Hedy Camara, Pharm D, pt is cleared to hold coumadin for 5 days. Telephoned pt and made him aware that he can hold, take last dose on 12/28/14. Instructed pt to call us when he is discharged from hospital so we can schedule a follow up CVRR appt.

## 2014-12-26 ENCOUNTER — Telehealth: Payer: Self-pay | Admitting: *Deleted

## 2014-12-26 NOTE — Telephone Encounter (Signed)
Faxed cardiac clearance to [facility] for left BKA revision.  Coumadin addressed by coumadin clinic.

## 2015-01-02 MED ORDER — CEFAZOLIN SODIUM-DEXTROSE 2-3 GM-% IV SOLR
2.0000 g | INTRAVENOUS | Status: AC
  Administered 2015-01-03: 2 g via INTRAVENOUS
  Filled 2015-01-02: qty 50

## 2015-01-02 NOTE — Progress Notes (Signed)
Pt notified of time change.New arrival time of 69.Pt verbalized understanding.

## 2015-01-02 NOTE — Progress Notes (Signed)
Patient notified of time change and instructed to arrive at 0830 instead of 0940. Patient verbalized understanding.

## 2015-01-03 ENCOUNTER — Encounter (HOSPITAL_COMMUNITY): Payer: Self-pay | Admitting: *Deleted

## 2015-01-03 ENCOUNTER — Encounter (HOSPITAL_COMMUNITY)
Admission: RE | Disposition: A | Discharge status: Rehabilitation Facility | Payer: Self-pay | Source: Ambulatory Visit | Attending: Orthopaedic Surgery

## 2015-01-03 ENCOUNTER — Inpatient Hospital Stay (HOSPITAL_COMMUNITY): Payer: Medicare Other | Admitting: Anesthesiology

## 2015-01-03 ENCOUNTER — Inpatient Hospital Stay (HOSPITAL_COMMUNITY)
Admission: RE | Admit: 2015-01-03 | Discharge: 2015-01-08 | DRG: 493 | Disposition: A | Discharge status: Rehabilitation Facility | Payer: Medicare Other | Source: Ambulatory Visit | Attending: Orthopaedic Surgery | Admitting: Orthopaedic Surgery

## 2015-01-03 ENCOUNTER — Inpatient Hospital Stay (HOSPITAL_COMMUNITY): Payer: Medicare Other | Admitting: Emergency Medicine

## 2015-01-03 DIAGNOSIS — Z79899 Other long term (current) drug therapy: Secondary | ICD-10-CM

## 2015-01-03 DIAGNOSIS — K5900 Constipation, unspecified: Secondary | ICD-10-CM | POA: Diagnosis present

## 2015-01-03 DIAGNOSIS — I10 Essential (primary) hypertension: Secondary | ICD-10-CM | POA: Diagnosis present

## 2015-01-03 DIAGNOSIS — E119 Type 2 diabetes mellitus without complications: Secondary | ICD-10-CM | POA: Diagnosis present

## 2015-01-03 DIAGNOSIS — I480 Paroxysmal atrial fibrillation: Secondary | ICD-10-CM | POA: Diagnosis present

## 2015-01-03 DIAGNOSIS — Z89512 Acquired absence of left leg below knee: Secondary | ICD-10-CM | POA: Diagnosis not present

## 2015-01-03 DIAGNOSIS — Z89519 Acquired absence of unspecified leg below knee: Secondary | ICD-10-CM

## 2015-01-03 DIAGNOSIS — Z4781 Encounter for orthopedic aftercare following surgical amputation: Secondary | ICD-10-CM | POA: Diagnosis not present

## 2015-01-03 DIAGNOSIS — I739 Peripheral vascular disease, unspecified: Secondary | ICD-10-CM | POA: Diagnosis present

## 2015-01-03 DIAGNOSIS — E1140 Type 2 diabetes mellitus with diabetic neuropathy, unspecified: Secondary | ICD-10-CM | POA: Diagnosis not present

## 2015-01-03 DIAGNOSIS — E780 Pure hypercholesterolemia: Secondary | ICD-10-CM | POA: Diagnosis present

## 2015-01-03 DIAGNOSIS — K219 Gastro-esophageal reflux disease without esophagitis: Secondary | ICD-10-CM | POA: Diagnosis present

## 2015-01-03 DIAGNOSIS — Y838 Other surgical procedures as the cause of abnormal reaction of the patient, or of later complication, without mention of misadventure at the time of the procedure: Secondary | ICD-10-CM | POA: Diagnosis present

## 2015-01-03 DIAGNOSIS — Z89511 Acquired absence of right leg below knee: Secondary | ICD-10-CM | POA: Diagnosis not present

## 2015-01-03 DIAGNOSIS — Z833 Family history of diabetes mellitus: Secondary | ICD-10-CM

## 2015-01-03 DIAGNOSIS — I429 Cardiomyopathy, unspecified: Secondary | ICD-10-CM | POA: Diagnosis present

## 2015-01-03 DIAGNOSIS — Z794 Long term (current) use of insulin: Secondary | ICD-10-CM

## 2015-01-03 DIAGNOSIS — I447 Left bundle-branch block, unspecified: Secondary | ICD-10-CM | POA: Diagnosis present

## 2015-01-03 DIAGNOSIS — Z95810 Presence of automatic (implantable) cardiac defibrillator: Secondary | ICD-10-CM

## 2015-01-03 DIAGNOSIS — G629 Polyneuropathy, unspecified: Secondary | ICD-10-CM | POA: Diagnosis present

## 2015-01-03 DIAGNOSIS — E1040 Type 1 diabetes mellitus with diabetic neuropathy, unspecified: Secondary | ICD-10-CM | POA: Diagnosis not present

## 2015-01-03 DIAGNOSIS — N3000 Acute cystitis without hematuria: Secondary | ICD-10-CM | POA: Diagnosis not present

## 2015-01-03 DIAGNOSIS — Z7902 Long term (current) use of antithrombotics/antiplatelets: Secondary | ICD-10-CM

## 2015-01-03 DIAGNOSIS — I2510 Atherosclerotic heart disease of native coronary artery without angina pectoris: Secondary | ICD-10-CM | POA: Diagnosis present

## 2015-01-03 DIAGNOSIS — T8789 Other complications of amputation stump: Secondary | ICD-10-CM | POA: Diagnosis present

## 2015-01-03 DIAGNOSIS — I5022 Chronic systolic (congestive) heart failure: Secondary | ICD-10-CM | POA: Diagnosis present

## 2015-01-03 DIAGNOSIS — F0390 Unspecified dementia without behavioral disturbance: Secondary | ICD-10-CM | POA: Diagnosis not present

## 2015-01-03 DIAGNOSIS — IMO0002 Reserved for concepts with insufficient information to code with codable children: Secondary | ICD-10-CM

## 2015-01-03 DIAGNOSIS — R52 Pain, unspecified: Secondary | ICD-10-CM | POA: Diagnosis present

## 2015-01-03 DIAGNOSIS — Z7982 Long term (current) use of aspirin: Secondary | ICD-10-CM | POA: Diagnosis not present

## 2015-01-03 DIAGNOSIS — Z7901 Long term (current) use of anticoagulants: Secondary | ICD-10-CM

## 2015-01-03 HISTORY — PX: STUMP REVISION: SHX6102

## 2015-01-03 LAB — PROTIME-INR
INR: 1.02 (ref 0.00–1.49)
PROTHROMBIN TIME: 13.5 s (ref 11.6–15.2)

## 2015-01-03 LAB — GLUCOSE, CAPILLARY
Glucose-Capillary: 198 mg/dL — ABNORMAL HIGH (ref 70–99)
Glucose-Capillary: 174 mg/dL — ABNORMAL HIGH (ref 70–99)
Glucose-Capillary: 232 mg/dL — ABNORMAL HIGH (ref 70–99)

## 2015-01-03 LAB — APTT: APTT: 35 s (ref 24–37)

## 2015-01-03 SURGERY — REVISION, AMPUTATION SITE
Anesthesia: General | Site: Leg Lower | Laterality: Left

## 2015-01-03 MED ORDER — ADULT MULTIVITAMIN W/MINERALS CH
1.0000 | ORAL_TABLET | Freq: Every day | ORAL | Status: DC
  Administered 2015-01-04 – 2015-01-08 (×5): 1 via ORAL
  Filled 2015-01-03 (×5): qty 1

## 2015-01-03 MED ORDER — CLOPIDOGREL BISULFATE 75 MG PO TABS
75.0000 mg | ORAL_TABLET | Freq: Every day | ORAL | Status: DC
  Administered 2015-01-04 – 2015-01-08 (×5): 75 mg via ORAL
  Filled 2015-01-03 (×5): qty 1

## 2015-01-03 MED ORDER — VITAMIN E 45 MG (100 UNIT) PO CAPS
1000.0000 [IU] | ORAL_CAPSULE | Freq: Every day | ORAL | Status: DC
  Administered 2015-01-03 – 2015-01-07 (×5): 1000 [IU] via ORAL
  Filled 2015-01-03 (×6): qty 2

## 2015-01-03 MED ORDER — INSULIN ASPART 100 UNIT/ML ~~LOC~~ SOLN
0.0000 [IU] | Freq: Every day | SUBCUTANEOUS | Status: DC
Start: 2015-01-03 — End: 2015-01-08
  Administered 2015-01-03 – 2015-01-04 (×2): 2 [IU] via SUBCUTANEOUS
  Administered 2015-01-06: 3 [IU] via SUBCUTANEOUS
  Administered 2015-01-08: 2 [IU] via SUBCUTANEOUS

## 2015-01-03 MED ORDER — FENTANYL CITRATE (PF) 100 MCG/2ML IJ SOLN
INTRAMUSCULAR | Status: DC | PRN
  Administered 2015-01-03 (×2): 50 ug via INTRAVENOUS

## 2015-01-03 MED ORDER — HYDROMORPHONE HCL 1 MG/ML IJ SOLN
0.2500 mg | INTRAMUSCULAR | Status: DC | PRN
  Administered 2015-01-03 (×2): 0.5 mg via INTRAVENOUS

## 2015-01-03 MED ORDER — HYDROCODONE-ACETAMINOPHEN 7.5-325 MG PO TABS: 1.0000 | ORAL_TABLET | Freq: Four times a day (QID) | ORAL | Status: DC | PRN

## 2015-01-03 MED ORDER — PANTOPRAZOLE SODIUM 40 MG PO TBEC
80.0000 mg | DELAYED_RELEASE_TABLET | Freq: Every day | ORAL | Status: DC
  Administered 2015-01-04 – 2015-01-08 (×5): 80 mg via ORAL
  Filled 2015-01-03 (×5): qty 2

## 2015-01-03 MED ORDER — ONDANSETRON HCL 4 MG/2ML IJ SOLN
INTRAMUSCULAR | Status: AC
  Filled 2015-01-03: qty 2

## 2015-01-03 MED ORDER — LACTATED RINGERS IV SOLN
INTRAVENOUS | Status: DC
  Administered 2015-01-03 (×2): via INTRAVENOUS

## 2015-01-03 MED ORDER — ONDANSETRON HCL 4 MG PO TABS: 4.0000 mg | ORAL_TABLET | Freq: Four times a day (QID) | ORAL | Status: DC | PRN

## 2015-01-03 MED ORDER — DEXTROSE 5 % IV SOLN
INTRAVENOUS | Status: DC | PRN
  Administered 2015-01-03: 12:00:00 via INTRAVENOUS

## 2015-01-03 MED ORDER — POLYETHYLENE GLYCOL 3350 17 G PO PACK: 17.0000 g | PACK | Freq: Every day | ORAL | Status: DC | PRN

## 2015-01-03 MED ORDER — SORBITOL 70 % SOLN: 30.0000 mL | Freq: Every day | Status: DC | PRN

## 2015-01-03 MED ORDER — ONDANSETRON HCL 4 MG/2ML IJ SOLN
4.0000 mg | Freq: Four times a day (QID) | INTRAMUSCULAR | Status: DC | PRN
  Administered 2015-01-03: 4 mg via INTRAVENOUS
  Filled 2015-01-03: qty 2

## 2015-01-03 MED ORDER — 0.9 % SODIUM CHLORIDE (POUR BTL) OPTIME
TOPICAL | Status: DC | PRN
  Administered 2015-01-03: 1000 mL

## 2015-01-03 MED ORDER — WARFARIN SODIUM 5 MG PO TABS: 10.0000 mg | ORAL_TABLET | ORAL | Status: DC

## 2015-01-03 MED ORDER — INSULIN ASPART 100 UNIT/ML ~~LOC~~ SOLN
0.0000 [IU] | Freq: Three times a day (TID) | SUBCUTANEOUS | Status: DC
Start: 2015-01-04 — End: 2015-01-08
  Administered 2015-01-04: 3 [IU] via SUBCUTANEOUS
  Administered 2015-01-04: 8 [IU] via SUBCUTANEOUS
  Administered 2015-01-04 – 2015-01-05 (×3): 5 [IU] via SUBCUTANEOUS
  Administered 2015-01-05: 3 [IU] via SUBCUTANEOUS
  Administered 2015-01-06: 5 [IU] via SUBCUTANEOUS
  Administered 2015-01-06: 3 [IU] via SUBCUTANEOUS
  Administered 2015-01-06 – 2015-01-07 (×2): 8 [IU] via SUBCUTANEOUS
  Administered 2015-01-07: 5 [IU] via SUBCUTANEOUS
  Administered 2015-01-07: 8 [IU] via SUBCUTANEOUS
  Administered 2015-01-08 (×2): 3 [IU] via SUBCUTANEOUS

## 2015-01-03 MED ORDER — CARVEDILOL 12.5 MG PO TABS
12.5000 mg | ORAL_TABLET | Freq: Two times a day (BID) | ORAL | Status: DC
  Administered 2015-01-03 – 2015-01-08 (×11): 12.5 mg via ORAL
  Filled 2015-01-03 (×10): qty 1

## 2015-01-03 MED ORDER — PROPOFOL 10 MG/ML IV BOLUS
INTRAVENOUS | Status: DC | PRN
  Administered 2015-01-03: 160 mg via INTRAVENOUS

## 2015-01-03 MED ORDER — ACETAMINOPHEN 325 MG PO TABS: 650.0000 mg | ORAL_TABLET | Freq: Four times a day (QID) | ORAL | Status: DC | PRN

## 2015-01-03 MED ORDER — LIDOCAINE HCL (CARDIAC) 20 MG/ML IV SOLN
INTRAVENOUS | Status: DC | PRN
  Administered 2015-01-03: 75 mg via INTRAVENOUS

## 2015-01-03 MED ORDER — FENTANYL CITRATE (PF) 250 MCG/5ML IJ SOLN
INTRAMUSCULAR | Status: AC
  Filled 2015-01-03: qty 5

## 2015-01-03 MED ORDER — HYDROMORPHONE HCL 1 MG/ML IJ SOLN
INTRAMUSCULAR | Status: AC
  Filled 2015-01-03: qty 1

## 2015-01-03 MED ORDER — PREGABALIN 75 MG PO CAPS
75.0000 mg | ORAL_CAPSULE | Freq: Two times a day (BID) | ORAL | Status: DC
  Administered 2015-01-03 – 2015-01-08 (×10): 75 mg via ORAL
  Filled 2015-01-03 (×10): qty 1

## 2015-01-03 MED ORDER — PROPOFOL 10 MG/ML IV BOLUS
INTRAVENOUS | Status: AC
  Filled 2015-01-03: qty 20

## 2015-01-03 MED ORDER — SPIRONOLACTONE 12.5 MG HALF TABLET
12.5000 mg | ORAL_TABLET | Freq: Every day | ORAL | Status: DC
  Administered 2015-01-03 – 2015-01-07 (×5): 12.5 mg via ORAL
  Filled 2015-01-03 (×7): qty 1

## 2015-01-03 MED ORDER — MAGNESIUM CITRATE PO SOLN: 1.0000 | Freq: Once | ORAL | Status: AC | PRN

## 2015-01-03 MED ORDER — MORPHINE SULFATE 2 MG/ML IJ SOLN
1.0000 mg | INTRAMUSCULAR | Status: DC | PRN
  Administered 2015-01-03: 1 mg via INTRAVENOUS
  Filled 2015-01-03: qty 1

## 2015-01-03 MED ORDER — LIDOCAINE HCL (CARDIAC) 20 MG/ML IV SOLN
INTRAVENOUS | Status: AC
  Filled 2015-01-03: qty 5

## 2015-01-03 MED ORDER — CARVEDILOL 12.5 MG PO TABS
ORAL_TABLET | ORAL | Status: AC
  Filled 2015-01-03: qty 1

## 2015-01-03 MED ORDER — POTASSIUM CHLORIDE CRYS ER 20 MEQ PO TBCR
20.0000 meq | EXTENDED_RELEASE_TABLET | Freq: Two times a day (BID) | ORAL | Status: DC
  Administered 2015-01-03 – 2015-01-08 (×10): 20 meq via ORAL
  Filled 2015-01-03 (×10): qty 1

## 2015-01-03 MED ORDER — WARFARIN SODIUM 5 MG PO TABS
10.0000 mg | ORAL_TABLET | ORAL | Status: DC
  Administered 2015-01-04 – 2015-01-06 (×2): 10 mg via ORAL
  Filled 2015-01-03 (×2): qty 2

## 2015-01-03 MED ORDER — WARFARIN SODIUM 7.5 MG PO TABS: 15.0000 mg | ORAL_TABLET | ORAL | Status: DC

## 2015-01-03 MED ORDER — METHOCARBAMOL 500 MG PO TABS
500.0000 mg | ORAL_TABLET | Freq: Four times a day (QID) | ORAL | Status: DC | PRN
  Administered 2015-01-03 – 2015-01-05 (×3): 500 mg via ORAL
  Filled 2015-01-03 (×3): qty 1

## 2015-01-03 MED ORDER — OXYCODONE HCL 5 MG PO TABS
5.0000 mg | ORAL_TABLET | ORAL | Status: DC | PRN
  Administered 2015-01-03 – 2015-01-07 (×8): 10 mg via ORAL
  Filled 2015-01-03 (×9): qty 2

## 2015-01-03 MED ORDER — ACETAMINOPHEN 650 MG RE SUPP
650.0000 mg | Freq: Four times a day (QID) | RECTAL | Status: DC | PRN
Start: 2015-01-03 — End: 2015-01-08

## 2015-01-03 MED ORDER — WARFARIN - PHYSICIAN DOSING INPATIENT
Freq: Every day | Status: DC
  Administered 2015-01-04 – 2015-01-06 (×2)

## 2015-01-03 MED ORDER — CEFAZOLIN SODIUM-DEXTROSE 2-3 GM-% IV SOLR
2.0000 g | Freq: Four times a day (QID) | INTRAVENOUS | Status: AC
  Administered 2015-01-03 – 2015-01-04 (×3): 2 g via INTRAVENOUS
  Filled 2015-01-03 (×3): qty 50

## 2015-01-03 MED ORDER — WARFARIN SODIUM 7.5 MG PO TABS
15.0000 mg | ORAL_TABLET | ORAL | Status: DC
  Administered 2015-01-03 – 2015-01-07 (×3): 15 mg via ORAL
  Filled 2015-01-03 (×3): qty 2

## 2015-01-03 MED ORDER — ONDANSETRON HCL 4 MG/2ML IJ SOLN
INTRAMUSCULAR | Status: DC | PRN
  Administered 2015-01-03: 4 mg via INTRAVENOUS

## 2015-01-03 MED ORDER — ATORVASTATIN CALCIUM 10 MG PO TABS
10.0000 mg | ORAL_TABLET | Freq: Every day | ORAL | Status: DC
  Administered 2015-01-03 – 2015-01-08 (×6): 10 mg via ORAL
  Filled 2015-01-03 (×6): qty 1

## 2015-01-03 MED ORDER — DEXTROSE 5 % IV SOLN
500.0000 mg | Freq: Four times a day (QID) | INTRAVENOUS | Status: DC | PRN
  Filled 2015-01-03: qty 5

## 2015-01-03 MED ORDER — POLYSACCHARIDE IRON COMPLEX 150 MG PO CAPS
150.0000 mg | ORAL_CAPSULE | Freq: Every day | ORAL | Status: DC
Start: 2015-01-03 — End: 2015-01-08
  Administered 2015-01-03 – 2015-01-07 (×5): 150 mg via ORAL
  Filled 2015-01-03 (×6): qty 1

## 2015-01-03 MED ORDER — SODIUM CHLORIDE 0.9 % IV SOLN
INTRAVENOUS | Status: DC
  Administered 2015-01-03: 18:00:00 via INTRAVENOUS

## 2015-01-03 MED ORDER — MIDAZOLAM HCL 2 MG/2ML IJ SOLN
INTRAMUSCULAR | Status: AC
  Filled 2015-01-03: qty 2

## 2015-01-03 MED ORDER — METOCLOPRAMIDE HCL 5 MG/ML IJ SOLN
5.0000 mg | Freq: Three times a day (TID) | INTRAMUSCULAR | Status: DC | PRN
Start: 2015-01-03 — End: 2015-01-08

## 2015-01-03 MED ORDER — MIDAZOLAM HCL 5 MG/5ML IJ SOLN
INTRAMUSCULAR | Status: DC | PRN
  Administered 2015-01-03: 1 mg via INTRAVENOUS

## 2015-01-03 MED ORDER — VITAMIN D 1000 UNITS PO TABS
1000.0000 [IU] | ORAL_TABLET | Freq: Every day | ORAL | Status: DC
  Administered 2015-01-04 – 2015-01-08 (×5): 1000 [IU] via ORAL
  Filled 2015-01-03 (×5): qty 1

## 2015-01-03 MED ORDER — METOCLOPRAMIDE HCL 5 MG PO TABS
5.0000 mg | ORAL_TABLET | Freq: Three times a day (TID) | ORAL | Status: DC | PRN
Start: 2015-01-03 — End: 2015-01-08

## 2015-01-03 MED ORDER — SENNOSIDES-DOCUSATE SODIUM 8.6-50 MG PO TABS
1.0000 | ORAL_TABLET | Freq: Two times a day (BID) | ORAL | Status: DC
  Administered 2015-01-03 – 2015-01-08 (×10): 1 via ORAL
  Filled 2015-01-03 (×10): qty 1

## 2015-01-03 MED ORDER — ASPIRIN EC 81 MG PO TBEC
81.0000 mg | DELAYED_RELEASE_TABLET | Freq: Every day | ORAL | Status: DC
  Administered 2015-01-03 – 2015-01-08 (×6): 81 mg via ORAL
  Filled 2015-01-03 (×6): qty 1

## 2015-01-03 MED ORDER — CARVEDILOL 12.5 MG PO TABS: 12.5000 mg | ORAL_TABLET | Freq: Two times a day (BID) | ORAL | Status: DC

## 2015-01-03 MED ORDER — INSULIN DETEMIR 100 UNIT/ML ~~LOC~~ SOLN
35.0000 [IU] | Freq: Every day | SUBCUTANEOUS | Status: DC
  Administered 2015-01-03 – 2015-01-08 (×5): 35 [IU] via SUBCUTANEOUS
  Filled 2015-01-03 (×6): qty 0.35

## 2015-01-03 MED ORDER — DIPHENHYDRAMINE HCL 12.5 MG/5ML PO ELIX: 25.0000 mg | ORAL_SOLUTION | ORAL | Status: DC | PRN

## 2015-01-03 MED ORDER — ARTIFICIAL TEARS OP OINT
TOPICAL_OINTMENT | OPHTHALMIC | Status: AC
  Filled 2015-01-03: qty 3.5

## 2015-01-03 MED ORDER — FUROSEMIDE 40 MG PO TABS
40.0000 mg | ORAL_TABLET | Freq: Two times a day (BID) | ORAL | Status: DC
  Administered 2015-01-03 – 2015-01-08 (×10): 40 mg via ORAL
  Filled 2015-01-03 (×10): qty 1

## 2015-01-03 SURGICAL SUPPLY — 48 items
BANDAGE ESMARK 6X9 LF (GAUZE/BANDAGES/DRESSINGS) ×1 IMPLANT
BLADE SAW RECIP 87.9 MT (BLADE) ×3 IMPLANT
BNDG CMPR 9X6 STRL LF SNTH (GAUZE/BANDAGES/DRESSINGS) ×1
BNDG COHESIVE 4X5 TAN STRL (GAUZE/BANDAGES/DRESSINGS) ×3 IMPLANT
BNDG COHESIVE 6X5 TAN STRL LF (GAUZE/BANDAGES/DRESSINGS) ×6 IMPLANT
BNDG ESMARK 6X9 LF (GAUZE/BANDAGES/DRESSINGS) ×3
BNDG GAUZE ELAST 4 BULKY (GAUZE/BANDAGES/DRESSINGS) ×6 IMPLANT
COVER SURGICAL LIGHT HANDLE (MISCELLANEOUS) ×3 IMPLANT
CUFF TOURNIQUET SINGLE 34IN LL (TOURNIQUET CUFF) ×3 IMPLANT
DRAPE EXTREMITY T 121X128X90 (DRAPE) ×3 IMPLANT
DRAPE INCISE IOBAN 85X60 (DRAPES) ×2 IMPLANT
DRAPE ORTHO SPLIT 77X108 STRL (DRAPES)
DRAPE PROXIMA HALF (DRAPES) ×3 IMPLANT
DRAPE SURG ORHT 6 SPLT 77X108 (DRAPES) IMPLANT
DRAPE U-SHAPE 47X51 STRL (DRAPES) ×3 IMPLANT
DRSG PAD ABDOMINAL 8X10 ST (GAUZE/BANDAGES/DRESSINGS) ×3 IMPLANT
ELECT CAUTERY BLADE 6.4 (BLADE) ×3 IMPLANT
ELECT REM PT RETURN 9FT ADLT (ELECTROSURGICAL) ×3
ELECTRODE REM PT RTRN 9FT ADLT (ELECTROSURGICAL) ×1 IMPLANT
EVACUATOR 1/8 PVC DRAIN (DRAIN) IMPLANT
FACESHIELD WRAPAROUND (MASK) IMPLANT
FACESHIELD WRAPAROUND OR TEAM (MASK) IMPLANT
GAUZE SPONGE 4X4 12PLY STRL (GAUZE/BANDAGES/DRESSINGS) ×3 IMPLANT
GAUZE XEROFORM 5X9 LF (GAUZE/BANDAGES/DRESSINGS) ×3 IMPLANT
GLOVE BIOGEL PI IND STRL 7.0 (GLOVE) IMPLANT
GLOVE BIOGEL PI INDICATOR 7.0 (GLOVE) ×4
GLOVE NEODERM STRL 7.5 LF PF (GLOVE) ×2 IMPLANT
GLOVE SURG NEODERM 7.5  LF PF (GLOVE) ×4
GLOVE SURG SS PI 7.0 STRL IVOR (GLOVE) ×2 IMPLANT
GOWN STRL REIN XL XLG (GOWN DISPOSABLE) ×3 IMPLANT
KIT BASIN OR (CUSTOM PROCEDURE TRAY) ×3 IMPLANT
NS IRRIG 1000ML POUR BTL (IV SOLUTION) ×3 IMPLANT
PACK GENERAL/GYN (CUSTOM PROCEDURE TRAY) ×3 IMPLANT
PAD ARMBOARD 7.5X6 YLW CONV (MISCELLANEOUS) ×3 IMPLANT
PAD CAST 4YDX4 CTTN HI CHSV (CAST SUPPLIES) ×4 IMPLANT
PADDING CAST COTTON 4X4 STRL (CAST SUPPLIES) ×3
SPONGE GAUZE 4X4 12PLY STER LF (GAUZE/BANDAGES/DRESSINGS) ×2 IMPLANT
SPONGE LAP 18X18 X RAY DECT (DISPOSABLE) ×2 IMPLANT
STAPLER VISISTAT 35W (STAPLE) IMPLANT
STOCKINETTE IMPERVIOUS LG (DRAPES) ×3 IMPLANT
SUT ETHILON 2 0 PSLX (SUTURE) ×6 IMPLANT
SUT MON AB 2-0 CT1 36 (SUTURE) ×6 IMPLANT
SUT PDS AB 0 CT 36 (SUTURE) ×2 IMPLANT
SUT SILK 0 TIES 10X30 (SUTURE) ×3 IMPLANT
SUT SILK 2 0 SH CR/8 (SUTURE) IMPLANT
SUT VIC AB 2-0 CT1 27 (SUTURE) ×6
SUT VIC AB 2-0 CT1 TAPERPNT 27 (SUTURE) ×2 IMPLANT
TOWEL OR 17X26 10 PK STRL BLUE (TOWEL DISPOSABLE) ×9 IMPLANT

## 2015-01-03 NOTE — Anesthesia Preprocedure Evaluation (Addendum)
Anesthesia Evaluation  Patient identified by MRN, date of birth, ID band Patient awake    Reviewed: Allergy & Precautions, NPO status , Patient's Chart, lab work & pertinent test results  History of Anesthesia Complications (+) PONV  Airway Mallampati: II  TM Distance: >3 FB Neck ROM: Full    Dental  (+) Teeth Intact, Missing, Dental Advisory Given   Pulmonary pneumonia -,  breath sounds clear to auscultation        Cardiovascular hypertension, Pt. on home beta blockers + CAD, + Peripheral Vascular Disease and +CHF + dysrhythmias + Cardiac Defibrillator Rhythm:Regular Rate:Normal     Neuro/Psych    GI/Hepatic GERD-  Controlled and Medicated,  Endo/Other  diabetes, Well Controlled, Type 2  Renal/GU      Musculoskeletal   Abdominal   Peds  Hematology   Anesthesia Other Findings   Reproductive/Obstetrics                           Anesthesia Physical Anesthesia Plan  ASA: III  Anesthesia Plan: General   Post-op Pain Management:    Induction: Intravenous  Airway Management Planned: LMA  Additional Equipment:   Intra-op Plan:   Post-operative Plan: Extubation in OR  Informed Consent: I have reviewed the patients History and Physical, chart, labs and discussed the procedure including the risks, benefits and alternatives for the proposed anesthesia with the patient or authorized representative who has indicated his/her understanding and acceptance.   Dental advisory given  Plan Discussed with: CRNA, Anesthesiologist and Surgeon  Anesthesia Plan Comments:        Anesthesia Quick Evaluation

## 2015-01-03 NOTE — Op Note (Signed)
Date of surgery: 01/03/2015  Preoperative diagnosis: Left below the knee amputation wound  Postoperative diagnosis: Same  Procedure: 1. Revision of left below-the-knee amputation 2. Adjacent tissue rearrangement 1 x 1 cm  Surgeon: Eduard Roux, M.D.  Anesthesia: Gen.  Estimated blood loss: 50 mL  Indications for procedure: The patient is a 69 year old gentleman who is status post left below-the-knee amputation who has been recently developed wounds as a result of the distal end of the tibial osteotomy. He had failed conservative treatment and was indicated for surgical revision of the amputation. He is aware of the risks, benefits, alternatives to surgery he wished to proceed.  Description of procedure: The patient was identified in the preoperative holding area. The operative site was marked by the surgeon and confirmed with the patient. He is brought back to the operating room. His placed supine on the table. He is given general anesthesia. A nonsterile tourniquet was placed on the upper left thigh. Left lower extremity was prepped and draped in standard sterile fashion. Preoperative antibiotics were given. A timeout was performed. The left lower extremity was exsanguinated using Esmarch bandage and the tourniquet was inflated to 350 mmHg. We ellipsed out the old incision and the anterior wound with the incision. Full-thickness flaps were raised. The distal end of the tibia was just beneath the skin surface with very little soft tissue coverage. There was a large bursa that had formed. There was no signs of infection. Once the full-thickness flaps were raised and the distal end of the tibia was exposed we osteotomized the tibia approximately 1 cm proximal to the original osteotomy site. We then used the salt also beveled down the distal end of the tibia to eliminate any sharp edges. Once this was done we assessed the soft tissue coverage and determined that he would have adequate coverage of the  distal end of the tibia. The wound was thoroughly irrigated. The tourniquet was deflated to assess for bleeding and all tissue beds exhibited healthy bleeding tissue. We then obtained hemostasis and closed the wound in a layered fashion using 0 PDS for the fascia, 2-0 Vicryl for the subcutaneous layer and 2-0 nylon for the skin. Sterile dressings were applied. The patient was extubated and transferred to the PACU in stable condition. All sponge counts were correct.  Postoperative plan: The patient will be admitted to the orthopedic service. He will be nonweightbearing. He will be up with physical therapy and occupational therapy tomorrow morning. He will need placement in [facility] comprehensive [facility].  Azucena Cecil, MD [place] 12:38 PM

## 2015-01-03 NOTE — Progress Notes (Signed)
Orthopedic Tech Progress Note Patient Details:  [name] [name] 1946/04/20 [id number]  Ortho Devices Ortho Device/Splint Location: applied ohf to bed Ortho Device/Splint Interventions: Ordered, Application   [name] 01/03/2015, 6:35 PM

## 2015-01-03 NOTE — Anesthesia Postprocedure Evaluation (Signed)
  Anesthesia Post-op Note  [name]  Procedure(s) Performed: Procedure(s): LEFT BELOW KNEE AMPUTATION REVISION (Left)  Patient Location: PACU  Anesthesia Type:General  Level of Consciousness: awake  Airway and Oxygen Therapy: Patient Spontanous Breathing  Post-op Pain: mild  Post-op Assessment: Post-op Vital signs reviewed  Post-op Vital Signs: Reviewed  Last Vitals:  Filed Vitals:   01/03/15 1420  BP: 112/67  Pulse: 73  Temp:   Resp: 14    Complications: No apparent anesthesia complications

## 2015-01-03 NOTE — Anesthesia Procedure Notes (Addendum)
Procedure Name: LMA Insertion Date/Time: 01/03/2015 11:36 AM Performed by: Terrill Mohr Pre-anesthesia Checklist: Emergency Drugs available, Patient identified, Suction available and Patient being monitored Patient Re-evaluated:Patient Re-evaluated prior to inductionOxygen Delivery Method: Circle system utilized Preoxygenation: Pre-oxygenation with 100% oxygen Intubation Type: IV induction Ventilation: Mask ventilation without difficulty LMA: LMA inserted LMA Size: 5.0 Number of attempts: 1 Placement Confirmation: positive ETCO2 and breath sounds checked- equal and bilateral Tube secured with: Tape (TAPED ACROSS CHEEKS) Dental Injury: Teeth and Oropharynx as per pre-operative assessment

## 2015-01-03 NOTE — H&P (Signed)
PREOPERATIVE H&P  Chief Complaint: Left below knee amputation irritation  HPI: [name] [name] is a 69 y.o. male who presents for surgical treatment of Left below knee amputation irritation.  He denies any changes in medical history.  Past Medical History  Diagnosis Date  . Cardiomyopathy, nonischemic     a. Cath 2003: mild nonobstructive CAD, EF 25% at that time.  [name] PAF (paroxysmal atrial fibrillation)     a. Noted on ICD interrogation 2012;  b. coumadin d/c'd 01/2013.  [name] NSVT (nonsustained ventricular tachycardia)     a. Noted on ICD interrogation in 2011.  . High cholesterol     takes Atorvastatin daily  . PAD (peripheral artery disease)     a. 08/2013 Periph Angio/PTA: Abd Ao nl, RLE- 3v runoff, PT diff dzs, AT 90p, LLE 2v runoff, PT 100, AT 7m (diamondback ORA/chocolate balloon PTA).  . Automatic implantable cardioverter-defibrillator in situ     a. 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  . Dementia   . Arthritis   . History of blood transfusion     no abnormal reaction noted  . Complication of anesthesia   . PONV (postoperative nausea and vomiting)   . Hypertension     takes Coreg daily  . LBBB (left bundle branch block)     takes Coumadin daily  . Anemia     takes Iron pill daily  . Constipation     takes Sennokot daily  . GERD (gastroesophageal reflux disease)     takes Nexium daily  . Chronic systolic CHF (congestive heart failure)     takes Furosemide daily as well as Aldactone  . Pneumonia     hx of > 27yr ago  . History of bronchitis     > 1 yr ago  . Headache     occasionally  . Peripheral neuropathy     hands;numbness and tingling   . Urinary frequency   . Urinary urgency   . History of kidney stones   . Type II diabetes mellitus     takes Levemir daily as well as Novolog  . CAD (coronary artery disease)    Past Surgical History  Procedure Laterality Date  . Lithotripsy  2001  . Cervical spine surgery  1994  .  Cardiac defibrillator placement  06/2009    WITH GENERATOR REPLACED; BiV ICD  . US echocardiography  03/21/2008    EF 30-35%  . Cardiovascular stress test  03/20/2009    EF 33%  . Transluminal atherectomy tibial artery Left 09/12/2013  . Toe amputation  10/04/2013    LEFT GREAT TOE AND 4TH TOE   /   DR Erlinda Hong  . Amputation Left 10/04/2013    Procedure: LEFT GREAT TOE AND SECOND TOE AMPUTATION;  Surgeon: Marianna Payment, MD;  Location: [place];  Service: Orthopedics;  Laterality: Left;  . Colonoscopy    . Leg amputation below knee Left 11/09/2013    DR Erlinda Hong  . Amputation Left 11/09/2013    Procedure: LEFT AMPUTATION BELOW KNEE;  Surgeon: Marianna Payment, MD;  Location: [place];  Service: Orthopedics;  Laterality: Left;  . Amputation Right 01/04/2014    Procedure: Doristine Devoid second and third toe amputation;  Surgeon: Marianna Payment, MD;  Location: [place];  Service: Orthopedics;  Laterality: Right;  . Amputation Right 01/30/2014    Procedure: RIGHT BELOW KNEE AMPUTATION;  Surgeon: Marianna Payment, MD;  Location: [place];  Service: Orthopedics;  Laterality: Right;  .  Lower extremity angiogram Bilateral 09/12/2013    Procedure: LOWER EXTREMITY ANGIOGRAM;  Surgeon: Lorretta Harp, MD;  Location: [facility] CATH LAB;  Service: Cardiovascular;  Laterality: Bilateral;  . Abdominal angiogram  09/12/2013    Procedure: ABDOMINAL ANGIOGRAM;  Surgeon: Lorretta Harp, MD;  Location: [facility] CATH LAB;  Service: Cardiovascular;;  . Lower extremity angiogram N/A 12/29/2013    Procedure: LOWER EXTREMITY ANGIOGRAM;  Surgeon: Lorretta Harp, MD;  Location: [facility] CATH LAB;  Service: Cardiovascular;  Laterality: N/A;  . Atherectomy Right 12/29/2013    Procedure: ATHERECTOMY;  Surgeon: Lorretta Harp, MD;  Location: [facility] CATH LAB;  Service: Cardiovascular;  Laterality: Right;  Anterior Tibial Artery  . Cardiac catheterization  10/01/2001    THERE WAS GLOBAL HYPOKINESIS AND EF 25%. THERE APPEARED TO BE GLOBAL DECREASE IN WALL MOTION  .  Esophagogastroduodenoscopy     History   Social History  . Marital Status: Married    Spouse Name: N/A  . Number of Children: N/A  . Years of Education: N/A   Social History Main Topics  . Smoking status: Never Smoker   . Smokeless tobacco: Never Used  . Alcohol Use: No  . Drug Use: No  . Sexual Activity: Not Currently   Other Topics Concern  . Not on file   Social History Narrative   Family History  Problem Relation Age of Onset  . Heart disease Mother   . Hypertension Mother   . Diabetes Mother   . Diabetes Father    No Known Allergies Prior to Admission medications   Medication Sig Start Date End Date Taking? Authorizing Provider  aspirin 81 MG EC tablet Take 1 tablet (81 mg total) by mouth daily. 10/24/14  Yes Deboraha Sprang, MD  atorvastatin (LIPITOR) 10 MG tablet Take 1 tablet (10 mg total) by mouth daily. 02/13/14  Yes Daniel J Angiulli, PA-C  carvedilol (COREG) 12.5 MG tablet Take 1 tablet (12.5 mg total) by mouth 2 (two) times daily with a meal. 02/13/14  Yes Daniel J Angiulli, PA-C  cholecalciferol (VITAMIN D) 1000 UNITS tablet Take 1,000 Units by mouth daily.   Yes Historical Provider, MD  clopidogrel (PLAVIX) 75 MG tablet Take 1 tablet (75 mg total) by mouth daily with breakfast. 02/13/14  Yes Lavon Paganini Angiulli, PA-C  esomeprazole (NEXIUM) 40 MG capsule Take 1 capsule (40 mg total) by mouth daily as needed (acid reflux). 02/13/14  Yes Daniel J Angiulli, PA-C  furosemide (LASIX) 40 MG tablet Take 1 tablet (40 mg total) by mouth 2 (two) times daily. 02/13/14  Yes Daniel J Angiulli, PA-C  insulin detemir (LEVEMIR) 100 UNIT/ML injection 25 units subcutaneous daily and 35 units subcutaneous each bedtime 02/13/14  Yes Daniel J Angiulli, PA-C  iron polysaccharides (NIFEREX) 150 MG capsule Take 1 capsule (150 mg total) by mouth daily. 02/13/14  Yes Daniel J Angiulli, PA-C  Multiple Vitamin (MULTIVITAMIN WITH MINERALS) TABS tablet Take 1 tablet by mouth daily.   Yes Historical  Provider, MD  potassium chloride SA (K-DUR,KLOR-CON) 20 MEQ tablet Take 1 tablet (20 mEq total) by mouth 2 (two) times daily. 02/13/14  Yes Daniel J Angiulli, PA-C  pregabalin (LYRICA) 75 MG capsule Take 1 capsule (75 mg total) by mouth 2 (two) times daily. 02/13/14  Yes Daniel J Angiulli, PA-C  senna-docusate (SENOKOT-S) 8.6-50 MG per tablet Take 1 tablet by mouth 2 (two) times daily. 11/28/13  Yes Ivan Anchors Love, PA-C  spironolactone (ALDACTONE) 25 MG tablet Take 0.5 tablets (12.5 mg total)  by mouth at bedtime. 02/13/14  Yes Daniel J Angiulli, PA-C  vitamin E (VITAMIN E) 1000 UNIT capsule Take 1,000 Units by mouth daily.   Yes Historical Provider, MD  warfarin (COUMADIN) 10 MG tablet Take as directed by coumadin clinic Patient taking differently: Take 10 mg by mouth See admin instructions. 10mg  Monday, Thursday and Saturday 11/22/14  Yes Deboraha Sprang, MD  warfarin (COUMADIN) 5 MG tablet Take as directed by Coumadin clinic Patient taking differently: Take 15 mg by mouth See admin instructions. 15mg  Sunday, Tuesday, Wednesday, Friday 12/12/14  Yes Deboraha Sprang, MD     Positive ROS: All other systems have been reviewed and were otherwise negative with the exception of those mentioned in the HPI and as above.  Physical Exam: General: Alert, no acute distress Cardiovascular: No pedal edema Respiratory: No cyanosis, no use of accessory musculature GI: abdomen soft Skin: No lesions in the area of chief complaint Neurologic: Sensation intact distally Psychiatric: Patient is competent for consent with normal mood and affect Lymphatic: no lymphedema  MUSCULOSKELETAL: exam stable  Assessment: Left below knee amputation irritation  Plan: Plan for Procedure(s): LEFT BELOW KNEE AMPUTATION REVISION  The risks benefits and alternatives were discussed with the patient including but not limited to the risks of nonoperative treatment, versus surgical intervention including infection, bleeding, nerve  injury,  blood clots, cardiopulmonary complications, morbidity, mortality, among others, and they were willing to proceed.   Marianna Payment, MD   01/03/2015 7:17 AM

## 2015-01-03 NOTE — Transfer of Care (Signed)
Immediate Anesthesia Transfer of Care Note  [name]  Procedure(s) Performed: Procedure(s): LEFT BELOW KNEE AMPUTATION REVISION (Left) Patient Location: PACU  Anesthesia Type:General  Level of Consciousness: awake and patient cooperative  Airway & Oxygen Therapy: Patient Spontanous Breathing and Patient connected to nasal cannula oxygen  Post-op Assessment: Report given to RN, Post -op Vital signs reviewed and stable and Patient moving all extremities  Post vital signs: Reviewed and stable  Last Vitals:  Filed Vitals:   01/03/15 0908  BP: 111/47  Pulse: 87  Temp: 36.7 C  Resp: 18    Complications: No apparent anesthesia complications

## 2015-01-04 ENCOUNTER — Encounter (HOSPITAL_COMMUNITY): Payer: Self-pay | Admitting: Orthopaedic Surgery

## 2015-01-04 LAB — PROTIME-INR
INR: 1.13 (ref 0.00–1.49)
Prothrombin Time: 14.6 seconds (ref 11.6–15.2)

## 2015-01-04 LAB — GLUCOSE, CAPILLARY
GLUCOSE-CAPILLARY: 181 mg/dL — AB (ref 65–99)
GLUCOSE-CAPILLARY: 253 mg/dL — AB (ref 65–99)
Glucose-Capillary: 237 mg/dL — ABNORMAL HIGH (ref 65–99)
Glucose-Capillary: 225 mg/dL — ABNORMAL HIGH (ref 65–99)

## 2015-01-04 NOTE — Progress Notes (Signed)
   Subjective:  Patient reports pain as mild.    Objective:   VITALS:   Filed Vitals:   01/03/15 1615 01/03/15 1803 01/03/15 2116 01/04/15 0502  BP:  138/66 120/53 111/60  Pulse: 77 88 108   Temp: 98.4 F (36.9 C) 98 F (36.7 C) 98.6 F (37 C) 98.8 F (37.1 C)  TempSrc:   Oral Oral  Resp: 18 14 19 17   Weight:      SpO2: 100% 100% 99% 96%    Dressing c/d/i   Lab Results  Component Value Date   WBC 4.7 12/21/2014   HGB 13.0 12/21/2014   HCT 37.6* 12/21/2014   MCV 73.2* 12/21/2014   PLT 321 12/21/2014     Assessment/Plan:  1 Day Post-Op   - Expected postop acute blood loss anemia - will monitor for symptoms - Up with PT/OT - DVT ppx - SCDs, ambulation, asa, plavix - NWB left lower extremity - patient would benefit from CIR   Marianna Payment 01/04/2015, 12:32 PM [phone]

## 2015-01-04 NOTE — Progress Notes (Signed)
Occupational Therapy Evaluation Patient Details Name: [name] [name] MRN: [id number] DOB: June 29, 1946 Today's Date: 01/04/2015    History of Present Illness Patient is a 69 y/o male s/p left BKA revision. PMH of L BKA ~11 months ago, dementia, HTN, LBBB, CHF, PNA, peripheral neuropathy, DM and CAD.    Clinical Impression   Patient mod I > occasional min assist PTA, per his report. Patient currently functioning at up to a max assist level; min assist for sitting balance and UB ADLs, mod assist for LB ADLs and squat pivot transfer, max assist for bed mobility. Patient will benefit from acute OT to increase overall independence in the areas of ADLs, functional mobility, and overall safety in order to safely discharge home with 24/7 supervision/assistance. Patient reports that he has someone to assist him 24/7. Patient unsafe to go home without 24/7. Patient with increased pain which limited he ability to be more independent today. Will continue to follow for OT and change d/c plan as appropriate and prn.     Follow Up Recommendations  Home health OT;Supervision/Assistance - 24 hour (IF patient has 24/7 supervision/assistance)    Equipment Recommendations  None recommended by OT    Recommendations for Other Services  None at this time   Precautions / Restrictions Precautions Precautions: Fall Restrictions Weight Bearing Restrictions: Yes LLE Weight Bearing: Non weight bearing      Mobility Bed Mobility Overal bed mobility: Needs Assistance Bed Mobility: Supine to Sit     Supine to sit: Max assist Sit to supine: Min guard   General bed mobility comments: Max A to elevate trunk and scoot bottom to EOB. Poor trunk control initially in sitting. Limited by pain.  Transfers Overall transfer level: Needs assistance   Transfers: Squat Pivot Transfers     Squat pivot transfers: Mod assist     General transfer comment: Heavy mod assist > recliner from EOB while using right  prosthesis.     Balance Overall balance assessment: Needs assistance Sitting-balance support: No upper extremity supported;Feet unsupported Sitting balance-Leahy Scale: Poor Sitting balance - Comments: Initially requiring Mod A for static sitting balance improving to Min guard assist with proper positioning and weight shift. Able to perform dynamic sitting guarded without LOB posteriorly.    ADL Overall ADL's : Needs assistance/impaired General ADL Comments: Patient with decreased dynamic sitting balance EOB. Patient states he has increased pain in Left residual limb. Patient min assist for UB ADLs and mod assist for LB ADLs. Patient required min assist for donning of right prosthesis while seated EOB. Patient transferred EOB > recliner with mod assist (squat pivot technique). Patient will need 24/7 supervision/assistance post discharge.     Pertinent Vitals/Pain Pain Assessment: 0-10 Pain Score: 8  Faces Pain Scale: Hurts even more Pain Location: left residual limb Pain Descriptors / Indicators: Sore;Aching Pain Intervention(s): Monitored during session;Repositioned     Hand Dominance Right   Extremity/Trunk Assessment Upper Extremity Assessment Upper Extremity Assessment: Generalized weakness   Lower Extremity Assessment Lower Extremity Assessment: Defer to PT evaluation RLE Deficits / Details: Grossly ~3+/5 throughout hip, knee. LLE Deficits / Details: Able to perform SLR. Unable to fully active quadriceps to perform QS. Grossly ~3+/5 hip ext, hip flexion, knee MMT not assessed due to pain. LLE Sensation: decreased light touch   Cervical / Trunk Assessment Cervical / Trunk Assessment: Normal   Communication Communication Communication: No difficulties   Cognition Arousal/Alertness: Awake/alert Behavior During Therapy: WFL for tasks assessed/performed Overall Cognitive Status: No family/caregiver present to  determine baseline cognitive functioning              Home  Living Family/patient expects to be discharged to:: Private residence Living Arrangements: Spouse/significant other Available Help at Discharge: Family;Available 24 hours/day Type of Home: House Home Access: Stairs to enter [employer] of Steps: 2 Entrance Stairs-Rails: Right Home Layout: One level     Bathroom Shower/Tub: Tub/shower unit;Curtain   Bathroom Toilet: Handicapped height     Home Equipment: Environmental consultant - 2 wheels;Wheelchair - manual;Bedside commode   Additional Comments: Pt was sponge bathing Prior to admit      Prior Functioning/Environment Level of Independence: Independent with assistive device(s);Needs assistance  Gait / Transfers Assistance Needed: Pt reports donning Bil prostheses everyday and uses RW for ambulation. Uses w/c for longer distances.  ADL's / Homemaking Assistance Needed: Wife does all IADLs. When wife is working, pt's brother stays with him per pt report.   Comments: Pt reports Mod I for ADLs however per note ~11 months ago, wife reports pt requires assist with getting into bathroom and assist with bathing? not sure of accuracy of PLOF, hx of dementia?    OT Diagnosis: Generalized weakness;Acute pain   OT Problem List: Decreased strength;Decreased activity tolerance;Impaired balance (sitting and/or standing);Decreased coordination;Decreased safety awareness;Decreased knowledge of use of DME or AE;Decreased knowledge of precautions;Pain   OT Treatment/Interventions: Self-care/ADL training;Therapeutic exercise;Energy conservation;DME and/or AE instruction;Therapeutic activities;Balance training;Patient/family education    OT Goals(Current goals can be found in the care plan section) Acute Rehab OT Goals Patient Stated Goal: go home OT Goal Formulation: With patient Time For Goal Achievement: 01/11/15 Potential to Achieve Goals: Good ADL Goals Pt Will Perform Grooming: Independently;sitting (EOB, unsupported) Pt Will Perform Upper Body  Bathing: Independently;sitting (unsupported) Pt Will Perform Lower Body Bathing: with min assist;sitting/lateral leans Pt Will Perform Upper Body Dressing: Independently;sitting (unsupported) Pt Will Perform Lower Body Dressing: with min assist;sitting/lateral leans Pt Will Transfer to Toilet: with min assist;bedside commode Pt Will Perform Tub/Shower Transfer: Tub transfer;tub bench;rolling walker  OT Frequency: Min 2X/week   Barriers to D/C: None known at this time, patient states he will have 24/7 supervision/assistance   End of Session Activity Tolerance: Patient tolerated treatment well Patient left: in chair;with call bell/phone within reach   Time: 8088-1103 OT Time Calculation (min): 20 min Charges:  OT General Charges $OT Visit: 1 Procedure OT Evaluation $Initial OT Evaluation Tier I: 1 Procedure  Ayane Delancey , MS, OTR/L, CLT Pager: [phone]  01/04/2015, 12:29 PM

## 2015-01-04 NOTE — Evaluation (Addendum)
Physical Therapy Evaluation Patient Details Name: [name] [name] MRN: [id number] DOB: Oct 19, 1945 Today's Date: 01/04/2015   History of Present Illness  Patient is a 69 y/o male s/p left BKA revision. PMH of L BKA ~11 months ago, dementia, HTN, LBBB, CHF, PNA, peripheral neuropathy, DM and CAD.     Clinical Impression  Patient presents with pain, weakness and balance deficits s/p surgery impacting mobility. Mobility assessment limited as pt did not have prosthesis present in room to performs transfers/gait. Called PACU to bring them up. Not sure of pt's reported PLOF/history as it clashes with information from prior note. Will need to assess transfers and ambulation once prosthesis is donned and make further recommendations for discharge. Will continue to follow to maximize independence.     Follow Up Recommendations Home health PT;Supervision/Assistance - 24 hour    Equipment Recommendations  None recommended by PT    Recommendations for Other Services OT consult     Precautions / Restrictions Precautions Precautions: Fall Restrictions Pt with Bil BKAs. Wears protheses bilaterally. Weight Bearing Restrictions: Yes LLE Weight Bearing: Non weight bearing      Mobility  Bed Mobility Overal bed mobility: Needs Assistance Bed Mobility: Supine to Sit;Sit to Supine     Supine to sit: Max assist Sit to supine: Min guard   General bed mobility comments: Max A to elevate trunk and scoot bottom to EOB. Poor trunk control initially in sitting. Limited by pain. Min guard to get back into supine. Use of trapeze bar to pull self up in bed.   Transfers Overall transfer level:  (Not assessed as pt does not have prostheses present. Called PACU to bring up to room.)                  Ambulation/Gait                Stairs            Wheelchair Mobility    Modified Rankin (Stroke Patients Only)       Balance Overall balance assessment: Needs  assistance Sitting-balance support: Feet supported;Bilateral upper extremity supported Sitting balance-Leahy Scale: Fair Sitting balance - Comments: Initially requiring Mod A for static sitting balance improving to Min guard assist with proper positioning and weight shift. Able to perform dynamic sitting guarded without LOB posteriorly.                                     Pertinent Vitals/Pain Pain Assessment: Faces Faces Pain Scale: Hurts even more Pain Location: left stump Pain Descriptors / Indicators: Sore;Aching Pain Intervention(s): Limited activity within patient's tolerance;Monitored during session;Repositioned    Home Living Family/patient expects to be discharged to:: Private residence Living Arrangements: Spouse/significant other Available Help at Discharge: Family;Available 24 hours/day Type of Home: House Home Access: Stairs to enter Entrance Stairs-Rails: Right Entrance Stairs-Number of Steps: 2 Home Layout: One level Home Equipment: Walker - 2 wheels;Wheelchair - manual      Prior Function Level of Independence: Independent with assistive device(s);Needs assistance   Gait / Transfers Assistance Needed: Pt reports donning Bil prostheses everyday and uses RW for ambulation. Uses w/c for longer distances.   ADL's / Homemaking Assistance Needed: Wife does all IADLs. When wife is working, pt's brother stays with him per pt report.  Comments: Pt reports Mod I for ADLs however per note ~11 months ago, wife reports pt requires assist with getting into  bathroom and assist with bathing? not sure of accuracy of PLOF, hx of dementia?     Hand Dominance   Dominant Hand: Right    Extremity/Trunk Assessment   Upper Extremity Assessment: Defer to OT evaluation           Lower Extremity Assessment: LLE deficits/detail;RLE deficits/detail;Generalized weakness RLE Deficits / Details: Grossly ~3+/5 throughout hip, knee. LLE Deficits / Details: Able to  perform SLR. Unable to fully active quadriceps to perform QS. Grossly ~3+/5 hip ext, hip flexion, knee MMT not assessed due to pain.     Communication   Communication: No difficulties  Cognition Arousal/Alertness: Awake/alert Behavior During Therapy: WFL for tasks assessed/performed Overall Cognitive Status: No family/caregiver present to determine baseline cognitive functioning                      General Comments      Exercises General Exercises - Lower Extremity Quad Sets: Both;5 reps;Supine Gluteal Sets: Both;5 reps;Supine Straight Leg Raises: Both;5 reps;Supine      Assessment/Plan    PT Assessment Patient needs continued PT services  PT Diagnosis Acute pain;Generalized weakness   PT Problem List Decreased strength;Pain;Decreased range of motion;Impaired sensation;Decreased balance;Decreased mobility  PT Treatment Interventions Balance training;Gait training;Patient/family education;Functional mobility training;Therapeutic activities;Wheelchair mobility training;Therapeutic exercise   PT Goals (Current goals can be found in the Care Plan section) Acute Rehab PT Goals Patient Stated Goal: to make this pain go away PT Goal Formulation: With patient Time For Goal Achievement: 01/18/15 Potential to Achieve Goals: Fair    Frequency Min 3X/week   Barriers to discharge        Co-evaluation               End of Session   Activity Tolerance: Patient limited by pain Patient left: in bed;with call bell/phone within reach;with bed alarm set Nurse Communication: Mobility status;Other (comment) (need for bil prosthesis )         Time: 0955-1010 PT Time Calculation (min) (ACUTE ONLY): 15 min   Charges:   PT Evaluation $Initial PT Evaluation Tier I: 1 Procedure     PT G Codes:        Dekisha Mesmer A Euclide Granito 01/04/2015, 10:28 AM  Wray Kearns, PT, DPT [phone]

## 2015-01-05 DIAGNOSIS — Z89512 Acquired absence of left leg below knee: Secondary | ICD-10-CM

## 2015-01-05 LAB — GLUCOSE, CAPILLARY
GLUCOSE-CAPILLARY: 161 mg/dL — AB (ref 65–99)
Glucose-Capillary: 235 mg/dL — ABNORMAL HIGH (ref 65–99)
Glucose-Capillary: 231 mg/dL — ABNORMAL HIGH (ref 65–99)
Glucose-Capillary: 184 mg/dL — ABNORMAL HIGH (ref 65–99)

## 2015-01-05 LAB — CBC
HCT: 32.4 % — ABNORMAL LOW (ref 39.0–52.0)
HEMOGLOBIN: 10.5 g/dL — AB (ref 13.0–17.0)
MCH: 24.1 pg — ABNORMAL LOW (ref 26.0–34.0)
MCHC: 32.4 g/dL (ref 30.0–36.0)
MCV: 74.5 fL — ABNORMAL LOW (ref 78.0–100.0)
Platelets: 182 10*3/uL (ref 150–400)
RBC: 4.35 MIL/uL (ref 4.22–5.81)
RDW: 15.9 % — AB (ref 11.5–15.5)
WBC: 5.6 10*3/uL (ref 4.0–10.5)

## 2015-01-05 LAB — PROTIME-INR
INR: 1.2 (ref 0.00–1.49)
Prothrombin Time: 15.4 seconds — ABNORMAL HIGH (ref 11.6–15.2)

## 2015-01-05 NOTE — Progress Notes (Signed)
Physical Therapy Treatment Patient Details Name: [name] [name] MRN: [id number] DOB: 1946-05-07 Today's Date: 01/05/2015    History of Present Illness Patient is a 69 y/o male s/p left BKA revision. PMH of L BKA ~11 months ago, dementia, HTN, LBBB, CHF, PNA, peripheral neuropathy, DM and CAD.     PT Comments    Patient progressing well with mobility. Less pain today in left residual stump. Tolerated standing and taking a few hops in right prosthesis with min A for balance. Continues to require Max-Mod A for bed mobility and transfers. Tolerated HEP. Discharge recommendation updated to CIR to improve functional mobility and independence prior to return home.   Follow Up Recommendations  CIR     Equipment Recommendations  None recommended by PT    Recommendations for Other Services       Precautions / Restrictions Precautions Precautions: Fall Restrictions Weight Bearing Restrictions: Yes LLE Weight Bearing: Non weight bearing    Mobility  Bed Mobility Overal bed mobility: Needs Assistance Bed Mobility: Supine to Sit     Supine to sit: HOB elevated;Max assist     General bed mobility comments: Max A to elevate trunk and scoot bottom to EOB. Difficulty getting hips adjusted, LOB to right and posteriorly.  Transfers Overall transfer level: Needs assistance Equipment used: Rolling walker (2 wheeled) Transfers: Sit to/from Stand Sit to Stand: +2 physical assistance;Max assist         General transfer comment: Max A of 2 to rise from EOB with cues for anterior translation. Transferred to chair with uncontrolled descent into chair. Cues to reach back.  Ambulation/Gait Ambulation/Gait assistance: Min assist Ambulation Distance (Feet): 6 Feet Assistive device: Rolling walker (2 wheeled) Gait Pattern/deviations: Step-to pattern;Trunk flexed (hop to gait pattern.)   Gait velocity interpretation: Below normal speed for age/gender General Gait Details: Pt with Min A  to help take small hops. Fatigues easily.    Stairs            Wheelchair Mobility    Modified Rankin (Stroke Patients Only)       Balance Overall balance assessment: Needs assistance Sitting-balance support: Feet supported;No upper extremity supported Sitting balance-Leahy Scale: Fair Sitting balance - Comments: Initially requiring Mod A for static sitting balance improving to Min guard assist with proper positioning and weight shift. Able to perform dynamic sitting guarded without LOB posteriorly. ABle to donn prosthesis with Min A for balance.  Postural control: Posterior lean   Standing balance-Leahy Scale: Poor Standing balance comment: Relient on RW.                     Cognition Arousal/Alertness: Awake/alert Behavior During Therapy: WFL for tasks assessed/performed Overall Cognitive Status: No family/caregiver present to determine baseline cognitive functioning                      Exercises General Exercises - Lower Extremity Quad Sets: Both;10 reps;Supine Long Arc Quad: Both;10 reps;Seated Straight Leg Raises: Both;10 reps;Supine Other Exercises Other Exercises: Tricep chair dips x10    General Comments        Pertinent Vitals/Pain Pain Assessment: Faces Faces Pain Scale: Hurts a little bit Pain Location: left residual limb Pain Descriptors / Indicators: Sore Pain Intervention(s): Monitored during session;Repositioned    Home Living                      Prior Function  PT Goals (current goals can now be found in the care plan section) Progress towards PT goals: Progressing toward goals    Frequency  Min 3X/week    PT Plan Discharge plan needs to be updated    Co-evaluation PT/OT/SLP Co-Evaluation/Treatment: Yes Reason for Co-Treatment: For patient/therapist safety PT goals addressed during session: Mobility/safety with mobility;Strengthening/ROM       End of Session Equipment Utilized During  Treatment: Gait belt;Other (comment) (prosthesis) Activity Tolerance: Patient tolerated treatment well Patient left: in chair;with call bell/phone within reach;with chair alarm set     Time: 2035-5974 PT Time Calculation (min) (ACUTE ONLY): 28 min  Charges:  $Therapeutic Activity: 8-22 mins                    G Codes:      M[place]adies Co A Chrishawna Farina 01/05/2015, 12:01 PM Wray Kearns, [place], DPT [phone]

## 2015-01-05 NOTE — Progress Notes (Signed)
Occupational Therapy Treatment Patient Details Name: [name] [name] MRN: [id number] DOB: 22-Mar-1946 Today's Date: 01/05/2015    History of present illness Patient is a 69 y/o male s/p left BKA revision. PMH of L BKA ~11 months ago, dementia, HTN, LBBB, CHF, PNA, peripheral neuropathy, DM and CAD.    OT comments  Patient making progress toward OT goals, continue plan of care for now. D/C recommendation has changed > CIR for comprehensive rehab prior to patient discharging home. Patient requires mod>max for bed mobility & transfers and up to mod assist for ADLs. Patient's pain has decreased since yesterday and he is able to perform better in therapy.    Follow Up Recommendations  CIR;Supervision/Assistance - 24 hour    Equipment Recommendations  None recommended by OT    Recommendations for Other Services  None at this time  Precautions / Restrictions Precautions Precautions: Fall Restrictions Weight Bearing Restrictions: Yes LLE Weight Bearing: Non weight bearing       Mobility Bed Mobility Overal bed mobility: Needs Assistance Bed Mobility: Supine to Sit   Supine to sit: HOB elevated;Max assist   General bed mobility comments: +2 present for safety, but no +2 physical assistance needed. Assistance needed for trunk control and to scoot buttock to EOB. Pt with increased LOB posteriorly.   Transfers Overall transfer level: Needs assistance Equipment used: Rolling walker (2 wheeled) Transfers: Sit to/from Stand Sit to Stand: +2 physical assistance;Max assist General transfer comment: Max A of 2 to rise from EOB with cues for anterior translation. Transferred to chair with uncontrolled descent into chair. Cues to reach back.    Balance Overall balance assessment: Needs assistance Sitting-balance support: Feet supported;No upper extremity supported Sitting balance-Leahy Scale: Fair Sitting balance - Comments: Initially requiring Mod A for static sitting balance improving  to Min guard assist with proper positioning and weight shift. Able to perform dynamic sitting guarded without LOB posteriorly. ABle to donn prosthesis with Min A for balance.  Postural control: Posterior lean   Standing balance-Leahy Scale: Poor Standing balance comment: Relient on RW.    ADL Overall ADL's : Needs assistance/impaired General ADL Comments: Patient with increased independence with overall mobility today. Patient engaged in bed mobility and sat EOB to donn right prosthesis with set-up assistance (pt was min assist yesterday). Patient then stood with RW and took ~5 steps forward, then sat in recliner. Educated patient on BUE strengthening exercises using theraband and encouraged patient to complete these ~3 times per day, 10X each.      Cognition   Behavior During Therapy: WFL for tasks assessed/performed Overall Cognitive Status: No family/caregiver present to determine baseline cognitive functioning                Pertinent Vitals/ Pain       Pain Assessment: Faces Faces Pain Scale: Hurts a little bit Pain Location: left residual limb Pain Descriptors / Indicators: Grimacing Pain Intervention(s): Monitored during session;Repositioned         Frequency Min 2X/week     Progress Toward Goals  OT Goals(current goals can now be found in the care plan section)  Progress towards OT goals: Progressing toward goals     Plan Discharge plan needs to be updated    Co-evaluation    PT/OT/SLP Co-Evaluation/Treatment: Yes Reason for Co-Treatment: For patient/therapist safety PT goals addressed during session: Mobility/safety with mobility;Strengthening/ROM OT goals addressed during session: ADL's and self-care;Strengthening/ROM      End of Session Equipment Utilized During Treatment: Gait belt;Rolling walker  Activity Tolerance Patient tolerated treatment well   Patient Left in chair;with call bell/phone within reach;with chair alarm set     Time:  4627-0350 OT Time Calculation (min): 20 min  Charges: OT General Charges $OT Visit: 1 Procedure OT Treatments $Therapeutic Activity: 8-22 mins  Tremell Reimers , MS, OTR/L, CLT Pager: [phone]  01/05/2015, 12:59 PM

## 2015-01-05 NOTE — Progress Notes (Signed)
Rehab admissions - Evaluated for possible admission.  I have opened the case and have requested acute inpatient rehab admission with insurance carrier.  I will update all once I hear back from insurance case manager.  Call me for questions.  [phone]

## 2015-01-05 NOTE — Clinical Social Work Note (Signed)
Clinical Social Work Assessment  Patient Details  Name: [name] [name] MRN: [id number] Date of Birth: Jan 07, 1946  Date of referral:  01/05/15               Reason for consult:  Facility Placement                Permission sought to share information with:  Chartered certified accountant granted to share information::  Yes, Verbal Permission Granted  Name::        Agency::   [facility] SNFs)  Relationship::     Contact Information:     Housing/Transportation Living arrangements for the past 2 months:  [place] of Information:  Patient Patient Interpreter Needed:  None Criminal Activity/Legal Involvement Pertinent to Current Situation/Hospitalization:  No - Comment as needed Significant Relationships:  Spouse Lives with:  Spouse Do you feel safe going back to the place where you live?  No (deconditioned wife cannot continue to take care of patient) Need for family participation in patient care:     Care giving concerns:  Wife feels that she cannot care for patient until he is able to do "somethings" on his own.   Social Worker assessment / plan:  MD requested CIR consult.  Unsure if patient meets insurance criteria for admittance to CIR.  CSW reviewed this with the patient and received permission to send clinical information out to SNFs in [facility] as an additional option for STR once discharged (projected over the weekend).  Patient is agreeable though reluctant to go to "an old folks home".  CSW provided support and education surrounding SNF and STR.  Employment status:  Disabled (Comment on whether or not currently receiving Disability), Retired Nurse, adult PT Recommendations:  [address] [place], [place] / Referral to community resources:  [place]  Patient/Family's Response to care:  Wife reports feelings of guilt regarding her inability to continue to take  care of husband.  CSW provided support and normalized this situation.  Patient/Family's Understanding of and Emotional Response to Diagnosis, Current Treatment, and Prognosis:  Patient expressed fear regarding his prognosis and not wanting to be a long-term resident at [facility]. CSW provided support and normalized this fear.  Patient is realistic regarding his px.  Emotional Assessment Appearance:  Appears stated age Attitude/Demeanor/Rapport:   (appropriate) Affect (typically observed):  Accepting, Afraid/Fearful, Pleasant Orientation:  Oriented to Self, Oriented to Place, Oriented to  Time, Oriented to Situation Alcohol / Substance use:  Not Applicable Psych involvement (Current and /or in the community):  No (Comment)  Discharge Needs  Concerns to be addressed:  No discharge needs identified Readmission within the last 30 days:    Current discharge risk:  None Barriers to Discharge:  No Barriers Identified   Nonnie Done, LCSW [phone]  [place] ([address]) Clinical Social Worker    Dulcy Fanny,  01/05/2015, 12:26 PM

## 2015-01-05 NOTE — Consult Note (Signed)
Physical Medicine and Rehabilitation Consult Reason for Consult: Revision left BKA Referring Physician: Dr.Xu   HPI: [name] [name] is a 69 y.o. right handed male with history of nonischemic cardiomyopathy, PAF with ICD with chronic Coumadin, peripheral vascular disease. [name] [name] lives with his wife used a wheelchair as well as bilateral prosthesis provided by [employer] prosthetics prior to admission. 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 He was discharged to home from latest rehabilitation admission in June 2015. Presented 01/03/2015 with developing wounds distal end of tibial osteotomy of left below-knee amputation site. Underwent revision of left BKA 01/03/2015 per Dr.Xu. Hospital course pain management. Patient remains on aspirin and Plavix therapy as prior to admission. Physical therapy evaluation completed. M.D. has requested physical medicine rehabilitation consult.   Review of Systems  Respiratory: Positive for shortness of breath.   Cardiovascular: Positive for palpitations.  Gastrointestinal: Positive for constipation.       GERD  Neurological: Positive for headaches.  All other systems reviewed and are negative.  Past Medical History  Diagnosis Date  . Cardiomyopathy, nonischemic     a. Cath 2003: mild nonobstructive CAD, EF 25% at that time.  [name] PAF (paroxysmal atrial fibrillation)     a. Noted on ICD interrogation 2012;  b. coumadin d/c'd 01/2013.  [name] NSVT (nonsustained ventricular tachycardia)     a. Noted on ICD interrogation in 2011.  . High cholesterol     takes Atorvastatin daily  . PAD (peripheral artery disease)     a. 08/2013 Periph Angio/PTA: Abd Ao nl, RLE- 3v runoff, PT diff dzs, AT 90p, LLE 2v runoff, PT 100, AT 94m (diamondback ORA/chocolate balloon PTA).  . Automatic implantable cardioverter-defibrillator in situ     a. 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  . Dementia   . Arthritis   . History of blood transfusion     no abnormal reaction noted  . Complication of anesthesia   . PONV (postoperative nausea and vomiting)   . Hypertension     takes Coreg daily  . LBBB (left bundle branch block)     takes Coumadin daily  . Anemia     takes Iron pill daily  . Constipation     takes Sennokot daily  . GERD (gastroesophageal reflux disease)     takes Nexium daily  . Chronic systolic CHF (congestive heart failure)     takes Furosemide daily as well as Aldactone  . Pneumonia     hx of > 49yr ago  . History of bronchitis     > 1 yr ago  . Headache     occasionally  . Peripheral neuropathy     hands;numbness and tingling   . Urinary frequency   . Urinary urgency   . History of kidney stones   . Type II diabetes mellitus     takes Levemir daily as well as Novolog  . CAD (coronary artery disease)    Past Surgical History  Procedure Laterality Date  . Lithotripsy  2001  . Cervical spine surgery  1994  . Cardiac defibrillator placement  06/2009    WITH GENERATOR REPLACED; BiV ICD  . US echocardiography  03/21/2008    EF 30-35%  . Cardiovascular stress test  03/20/2009    EF 33%  . Transluminal atherectomy tibial artery Left 09/12/2013  . Toe amputation  10/04/2013    LEFT  GREAT TOE AND 4TH TOE   /   DR Erlinda Hong  . Amputation Left 10/04/2013    Procedure: LEFT GREAT TOE AND SECOND TOE AMPUTATION;  Surgeon: Marianna Payment, MD;  Location: [place];  Service: Orthopedics;  Laterality: Left;  . Colonoscopy    . Leg amputation below knee Left 11/09/2013    DR Erlinda Hong  . Amputation Left 11/09/2013    Procedure: LEFT AMPUTATION BELOW KNEE;  Surgeon: Marianna Payment, MD;  Location: [place];  Service: Orthopedics;  Laterality: Left;  . Amputation Right 01/04/2014    Procedure: Doristine Devoid second and third toe amputation;  Surgeon: Marianna Payment, MD;  Location: [place];  Service: Orthopedics;  Laterality: Right;  .  Amputation Right 01/30/2014    Procedure: RIGHT BELOW KNEE AMPUTATION;  Surgeon: Marianna Payment, MD;  Location: [place];  Service: Orthopedics;  Laterality: Right;  . Lower extremity angiogram Bilateral 09/12/2013    Procedure: LOWER EXTREMITY ANGIOGRAM;  Surgeon: Lorretta Harp, MD;  Location: [facility] CATH LAB;  Service: Cardiovascular;  Laterality: Bilateral;  . Abdominal angiogram  09/12/2013    Procedure: ABDOMINAL ANGIOGRAM;  Surgeon: Lorretta Harp, MD;  Location: [facility] CATH LAB;  Service: Cardiovascular;;  . Lower extremity angiogram N/A 12/29/2013    Procedure: LOWER EXTREMITY ANGIOGRAM;  Surgeon: Lorretta Harp, MD;  Location: [facility] CATH LAB;  Service: Cardiovascular;  Laterality: N/A;  . Atherectomy Right 12/29/2013    Procedure: ATHERECTOMY;  Surgeon: Lorretta Harp, MD;  Location: [facility] CATH LAB;  Service: Cardiovascular;  Laterality: Right;  Anterior Tibial Artery  . Cardiac catheterization  10/01/2001    THERE WAS GLOBAL HYPOKINESIS AND EF 25%. THERE APPEARED TO BE GLOBAL DECREASE IN WALL MOTION  . Esophagogastroduodenoscopy    . Stump revision Left 01/03/2015    Procedure: LEFT BELOW KNEE AMPUTATION REVISION;  Surgeon: Leandrew Koyanagi, MD;  Location: [place];  Service: Orthopedics;  Laterality: Left;   Family History  Problem Relation Age of Onset  . Heart disease Mother   . Hypertension Mother   . Diabetes Mother   . Diabetes Father    Social History:  reports that he has never smoked. He has never used smokeless tobacco. He reports that he does not drink alcohol or use illicit drugs. Allergies: No Known Allergies Medications Prior to Admission  Medication Sig Dispense Refill  . aspirin 81 MG EC tablet Take 1 tablet (81 mg total) by mouth daily. 30 tablet 11  . atorvastatin (LIPITOR) 10 MG tablet Take 1 tablet (10 mg total) by mouth daily. 30 tablet 1  . carvedilol (COREG) 12.5 MG tablet Take 1 tablet (12.5 mg total) by mouth 2 (two) times daily with a meal. 60 tablet 1  . cholecalciferol  (VITAMIN D) 1000 UNITS tablet Take 1,000 Units by mouth daily.    . clopidogrel (PLAVIX) 75 MG tablet Take 1 tablet (75 mg total) by mouth daily with breakfast. 30 tablet 1  . esomeprazole (NEXIUM) 40 MG capsule Take 1 capsule (40 mg total) by mouth daily as needed (acid reflux). 30 capsule 1  . furosemide (LASIX) 40 MG tablet Take 1 tablet (40 mg total) by mouth 2 (two) times daily. 60 tablet 1  . insulin detemir (LEVEMIR) 100 UNIT/ML injection 25 units subcutaneous daily and 35 units subcutaneous each bedtime 10 mL 11  . iron polysaccharides (NIFEREX) 150 MG capsule Take 1 capsule (150 mg total) by mouth daily. 30 capsule 1  . Multiple Vitamin (MULTIVITAMIN WITH MINERALS) TABS  tablet Take 1 tablet by mouth daily.    . potassium chloride SA (K-DUR,KLOR-CON) 20 MEQ tablet Take 1 tablet (20 mEq total) by mouth 2 (two) times daily. 60 tablet 1  . pregabalin (LYRICA) 75 MG capsule Take 1 capsule (75 mg total) by mouth 2 (two) times daily. 60 capsule 1  . senna-docusate (SENOKOT-S) 8.6-50 MG per tablet Take 1 tablet by mouth 2 (two) times daily. 60 tablet 1  . spironolactone (ALDACTONE) 25 MG tablet Take 0.5 tablets (12.5 mg total) by mouth at bedtime. 30 tablet 1  . vitamin E (VITAMIN E) 1000 UNIT capsule Take 1,000 Units by mouth daily.    [name] warfarin (COUMADIN) 10 MG tablet Take as directed by coumadin clinic (Patient taking differently: Take 10 mg by mouth See admin instructions. 10mg  Monday, Thursday and Saturday) 100 tablet 3  . warfarin (COUMADIN) 5 MG tablet Take as directed by Coumadin clinic (Patient taking differently: Take 15 mg by mouth See admin instructions. 15mg  Sunday, Tuesday, Wednesday, Friday) 70 tablet 3    Home: Home Living Family/patient expects to be discharged to:: Private residence Living Arrangements: Spouse/significant other Available Help at Discharge: Family, Available 24 hours/day Type of Home: House Home Access: Stairs to enter [employer] of Steps:  2 Entrance Stairs-Rails: Right Home Layout: One level Home Equipment: Environmental consultant - 2 wheels, Wheelchair - manual, Bedside commode Additional Comments: Pt was sponge bathing Prior to admit  Functional History: Prior Function Level of Independence: Independent with assistive device(s), Needs assistance Gait / Transfers Assistance Needed: Pt reports donning Bil prostheses everyday and uses RW for ambulation. Uses w/c for longer distances.  ADL's / Homemaking Assistance Needed: Wife does all IADLs. When wife is working, pt's brother stays with him per pt report. Comments: Pt reports Mod I for ADLs however per note ~11 months ago, wife reports pt requires assist with getting into bathroom and assist with bathing? not sure of accuracy of PLOF, hx of dementia? Functional Status:  Mobility: Bed Mobility Overal bed mobility: Needs Assistance Bed Mobility: Supine to Sit Supine to sit: Max assist Sit to supine: Min guard General bed mobility comments: Max A to elevate trunk and scoot bottom to EOB. Poor trunk control initially in sitting. Limited by pain. Transfers Overall transfer level: Needs assistance Transfers: Squat Pivot Transfers Squat pivot transfers: Mod assist General transfer comment: Heavy mod assist > recliner from EOB while using right prosthesis.       ADL: ADL Overall ADL's : Needs assistance/impaired General ADL Comments: Patient with decreased dynamic sitting balance EOB. Patient states he has increased pain in Left residual limb. Patient min assist for UB ADLs and mod assist for LB ADLs. Patient required min assist for donning of right prosthesis while seated EOB. Patient transferred EOB > recliner with mod assist (squat pivot technique). Patient will need 24/7 supervision/assistance post discharge.   Cognition: Cognition Overall Cognitive Status: No family/caregiver present to determine baseline cognitive functioning Orientation Level: Oriented  X4 Cognition Arousal/Alertness: Awake/alert Behavior During Therapy: WFL for tasks assessed/performed Overall Cognitive Status: No family/caregiver present to determine baseline cognitive functioning  Blood pressure 120/62, pulse 85, temperature 99 F (37.2 C), temperature source Oral, resp. rate 18, weight 90.719 kg (200 lb), SpO2 93 %. Physical Exam  Constitutional: He is oriented to person, place, and time. He appears well-developed.  HENT:  Head: Normocephalic.  Eyes: EOM are normal.  Neck: Normal range of motion. Neck supple. No thyromegaly present.  Cardiovascular: Normal rate and regular rhythm.  Respiratory: Breath sounds normal. No respiratory distress.  GI: Soft. Bowel sounds are normal. He exhibits no distension.  Neurological: He is alert and oriented to person, place, and time.  Mood is flat but appropriate.  Skin:  Left revised BKA site is dressed appropriately tender. Right BKA site well-healed    Results for orders placed or performed during the hospital encounter of 01/03/15 (from the past 24 hour(s))  Glucose, capillary     Status: Abnormal   Collection Time: 01/04/15 11:27 AM  Result Value Ref Range   Glucose-Capillary 237 (H) 65 - 99 mg/dL   Comment 1 Repeat Test    Comment 2 Document in Chart   Protime-INR     Status: None   Collection Time: 01/04/15  2:50 PM  Result Value Ref Range   Prothrombin Time 14.6 11.6 - 15.2 seconds   INR 1.13 0.00 - 1.49  Glucose, capillary     Status: Abnormal   Collection Time: 01/04/15  4:01 PM  Result Value Ref Range   Glucose-Capillary 253 (H) 65 - 99 mg/dL   Comment 1 Repeat Test    Comment 2 Document in Chart   Glucose, capillary     Status: Abnormal   Collection Time: 01/04/15  9:55 PM  Result Value Ref Range   Glucose-Capillary 225 (H) 65 - 99 mg/dL  Protime-INR     Status: Abnormal   Collection Time: 01/05/15  3:13 AM  Result Value Ref Range   Prothrombin Time 15.4 (H) 11.6 - 15.2 seconds   INR 1.20 0.00 -  1.49  CBC     Status: Abnormal   Collection Time: 01/05/15  3:13 AM  Result Value Ref Range   WBC 5.6 4.0 - 10.5 K/uL   RBC 4.35 4.22 - 5.81 MIL/uL   Hemoglobin 10.5 (L) 13.0 - 17.0 g/dL   HCT 32.4 (L) 39.0 - 52.0 %   MCV 74.5 (L) 78.0 - 100.0 fL   MCH 24.1 (L) 26.0 - 34.0 pg   MCHC 32.4 30.0 - 36.0 g/dL   RDW 15.9 (H) 11.5 - 15.5 %   Platelets 182 150 - 400 K/uL  Glucose, capillary     Status: Abnormal   Collection Time: 01/05/15  6:34 AM  Result Value Ref Range   Glucose-Capillary 161 (H) 65 - 99 mg/dL   No results found.  Assessment/Plan: Diagnosis: status post left BKA revision, hx of right BKA also (on two prostheses PTA) 1. Does the need for close, 24 hr/day medical supervision in concert with the patient's rehab needs make it unreasonable for this patient to be served in a less intensive setting? Yes 2. Co-Morbidities requiring supervision/potential complications: DM2, htn, NICM, Afib 3. Due to bladder management, bowel management, safety, skin/wound care, disease management, medication administration and pain management, does the patient require 24 hr/day rehab nursing? Yes 4. Does the patient require coordinated care of a physician, rehab nurse, PT (1-2 hrs/day, 5 days/week) and OT (1-2 hrs/day, 5 days/week) to address physical and functional deficits in the context of the above medical diagnosis(es)? Yes Addressing deficits in the following areas: balance, endurance, locomotion, strength, transferring, bowel/bladder control, bathing, dressing, feeding, grooming, toileting and psychosocial support 5. Can the patient actively participate in an intensive therapy program of at least 3 hrs of therapy per day at least 5 days per week? Yes 6. The potential for patient to make measurable gains while on inpatient rehab is excellent 7. Anticipated functional outcomes upon discharge from inpatient rehab are modified independent  with PT, modified independent with OT, n/a with  SLP. 8. Estimated rehab length of stay to reach the above functional goals is: 10-14 days 9. Does the patient have adequate social supports and living environment to accommodate these discharge functional goals? Yes 10. Anticipated D/C setting: Home 11. Anticipated post D/C treatments: HH therapy and Outpatient therapy 12. Overall Rehab/Functional Prognosis: excellent  RECOMMENDATIONS: This patient's condition is appropriate for continued rehabilitative care in the following setting: CIR Patient has agreed to participate in recommended program. Yes Note that insurance prior authorization may be required for reimbursement for recommended care.  Comment: Rehab Admissions Coordinator to follow up.  Thanks,  Meredith Staggers, MD, Mellody Drown     01/05/2015

## 2015-01-05 NOTE — Progress Notes (Signed)
   Subjective:  Patient reports pain as mild.    Objective:   VITALS:   Filed Vitals:   01/04/15 0502 01/04/15 1500 01/04/15 2046 01/05/15 0616  BP: 111/60 105/53 112/59 120/62  Pulse:  92 89 85  Temp: 98.8 F (37.1 C) 97.7 F (36.5 C) 99 F (37.2 C) 99 F (37.2 C)  TempSrc: Oral  Oral Oral  Resp: 17 18 19 18   Weight:      SpO2: 96% 93% 96% 93%    Dressing c/d/i   Lab Results  Component Value Date   WBC 5.6 01/05/2015   HGB 10.5* 01/05/2015   HCT 32.4* 01/05/2015   MCV 74.5* 01/05/2015   PLT 182 01/05/2015     Assessment/Plan:  2 Days Post-Op   - Expected postop acute blood loss anemia - will monitor for symptoms - CIR consult placed  Marianna Payment 01/05/2015, 9:03 AM [phone]

## 2015-01-06 ENCOUNTER — Inpatient Hospital Stay (HOSPITAL_COMMUNITY): Payer: Medicare Other | Admitting: Physical Therapy

## 2015-01-06 ENCOUNTER — Inpatient Hospital Stay (HOSPITAL_COMMUNITY): Payer: Medicare Other | Admitting: Occupational Therapy

## 2015-01-06 LAB — GLUCOSE, CAPILLARY
GLUCOSE-CAPILLARY: 224 mg/dL — AB (ref 65–99)
GLUCOSE-CAPILLARY: 231 mg/dL — AB (ref 65–99)
Glucose-Capillary: 151 mg/dL — ABNORMAL HIGH (ref 65–99)
Glucose-Capillary: 254 mg/dL — ABNORMAL HIGH (ref 65–99)

## 2015-01-06 LAB — PROTIME-INR
INR: 1.34 (ref 0.00–1.49)
Prothrombin Time: 16.7 seconds — ABNORMAL HIGH (ref 11.6–15.2)

## 2015-01-06 MED ORDER — MENTHOL 3 MG MT LOZG: 1.0000 | LOZENGE | OROMUCOSAL | Status: DC | PRN

## 2015-01-06 NOTE — Clinical Social Work Note (Signed)
Clinical Social Worker met with patient at bedside to present bed offers. Pt reported he is being considered for IP REHAB and wishes to continue therapy here at [facility].   CSW also contacted pt's wife and left a detailed message including SNF options.   CSW will continue to follow pt and pt's family for continued support and to facilitate pt's discharge needs once medically stable if requiring SNF placement vs CIR.   Glendon Axe, MSW, LCSWA [phone] 01/06/2015 2:10 PM

## 2015-01-06 NOTE — Progress Notes (Signed)
Subjective: 3 Days Post-Op Procedure(s) (LRB): LEFT BELOW KNEE AMPUTATION REVISION (Left) Patient reports pain as moderate.   Throat sore complaining of cough.  Tolerating diet  Objective: Vital signs in last 24 hours: Temp:  [98.2 F (36.8 C)-98.7 F (37.1 C)] 98.5 F (36.9 C) (05/14 0551) Pulse Rate:  [85-89] 86 (05/14 0551) Resp:  [16] 16 (05/14 0551) BP: (112-122)/(57-70) 122/68 mmHg (05/14 0551) SpO2:  [92 %-95 %] 95 % (05/14 0551)  Intake/Output from previous day: 05/13 0701 - 05/14 0700 In: -  Out: 700 [Urine:700] Intake/Output this shift:     Recent Labs  01/05/15 0313  HGB 10.5*    Recent Labs  01/05/15 0313  WBC 5.6  RBC 4.35  HCT 32.4*  PLT 182   No results for input(s): NA, K, CL, CO2, BUN, CREATININE, GLUCOSE, CALCIUM in the last 72 hours.  Recent Labs  01/05/15 0313 01/06/15 0736  INR 1.20 1.34    Incision: dressing C/D/I Compartment soft  Left knee good range of motion  Assessment/Plan: 3 Days Post-Op Procedure(s) (LRB): LEFT BELOW KNEE AMPUTATION REVISION (Left) Up with therapy  Monitor for symptoms of anemia. Incentive spirometry q 1 hr while awake  [name] [name] 01/06/2015, 9:01 AM

## 2015-01-06 NOTE — Clinical Social Work Note (Signed)
Clinical Social Worker uploaded updated clinicals and re-faxed patient in [place]. CSW to contact pt's wife, Enid Derry with bed offers.   CSW will continue to follow pt and pt's family for continued support and to facilitate pt's discharge needs once medically stable.  Glendon Axe, MSW, LCSWA [phone] 01/06/2015 9:53 AM

## 2015-01-07 ENCOUNTER — Inpatient Hospital Stay (HOSPITAL_COMMUNITY): Payer: Medicare Other | Admitting: Physical Therapy

## 2015-01-07 LAB — GLUCOSE, CAPILLARY
GLUCOSE-CAPILLARY: 266 mg/dL — AB (ref 65–99)
Glucose-Capillary: 215 mg/dL — ABNORMAL HIGH (ref 65–99)
Glucose-Capillary: 273 mg/dL — ABNORMAL HIGH (ref 65–99)
Glucose-Capillary: 226 mg/dL — ABNORMAL HIGH (ref 65–99)

## 2015-01-07 NOTE — Progress Notes (Signed)
Subjective: 4 Days Post-Op Procedure(s) (LRB): LEFT BELOW KNEE AMPUTATION REVISION (Left) Patient reports pain as mild.  No complaints.  Objective: Vital signs in last 24 hours: Temp:  [98.4 F (36.9 C)-98.5 F (36.9 C)] 98.4 F (36.9 C) (05/15 0450) Pulse Rate:  [81-86] 86 (05/15 0450) Resp:  [18] 18 (05/15 0450) BP: (111-123)/(60-68) 111/60 mmHg (05/15 0450) SpO2:  [95 %-98 %] 98 % (05/15 0450)  Intake/Output from previous day: 05/14 0701 - 05/15 0700 In: 570 [P.O.:570] Out: 1750 [Urine:1750] Intake/Output this shift: Total I/O In: -  Out: 600 [Urine:600]   Recent Labs  01/05/15 0313  HGB 10.5*    Recent Labs  01/05/15 0313  WBC 5.6  RBC 4.35  HCT 32.4*  PLT 182   No results for input(s): NA, K, CL, CO2, BUN, CREATININE, GLUCOSE, CALCIUM in the last 72 hours.  Recent Labs  01/05/15 0313 01/06/15 0736  INR 1.20 1.34   Left leg Incision: dressing C/D/I Compartment soft Able to straighten and flex knee Assessment/Plan: 4 Days Post-Op Procedure(s) (LRB): LEFT BELOW KNEE AMPUTATION REVISION (Left) CR or SNF early this week   CLARK, GILBERT 01/07/2015, 9:06 AM

## 2015-01-07 NOTE — Progress Notes (Signed)
CSW spoke with patient re: SNF bed offers- he did not wish to discuss this stating "I really want to stay here at the hospital to get my rehab."  Patient notified of need to also pursue SNF placement should [facility] be unable to accept him and he reluctantly agreed for CSW to contact his wife Enid Derry with bed offers.  CSW spoke to wife via phone and bed offers given. She also is adamant in her wish that he go to CIR but after extensive discussion- she agreed to accept bed offer list.  She prefers, if patient had to be placed- to go to [facility]- however- they currently have declined patient. CSW has sent note to Jolyne Loa for clarification and request to re-review patient.  Wife encouraged to consider other SNF options and CSW will follow up tomorrow morning. Stability is anticipated hopefully tomorrow.  Wife verbalized understanding and will discuss with her husband.  Lorie Phenix. Pauline Good, [place]

## 2015-01-08 ENCOUNTER — Inpatient Hospital Stay (HOSPITAL_COMMUNITY)
Admission: RE | Admit: 2015-01-08 | Discharge: 2015-01-15 | DRG: 560 | Disposition: A | Discharge status: Home Health | Payer: Medicare Other | Source: Intra-hospital | Attending: Physical Medicine & Rehabilitation | Admitting: Physical Medicine & Rehabilitation

## 2015-01-08 DIAGNOSIS — K5900 Constipation, unspecified: Secondary | ICD-10-CM | POA: Diagnosis present

## 2015-01-08 DIAGNOSIS — E1140 Type 2 diabetes mellitus with diabetic neuropathy, unspecified: Secondary | ICD-10-CM | POA: Diagnosis not present

## 2015-01-08 DIAGNOSIS — Z7902 Long term (current) use of antithrombotics/antiplatelets: Secondary | ICD-10-CM

## 2015-01-08 DIAGNOSIS — I5042 Chronic combined systolic (congestive) and diastolic (congestive) heart failure: Secondary | ICD-10-CM | POA: Diagnosis present

## 2015-01-08 DIAGNOSIS — S88111A Complete traumatic amputation at level between knee and ankle, right lower leg, initial encounter: Secondary | ICD-10-CM

## 2015-01-08 DIAGNOSIS — N390 Urinary tract infection, site not specified: Secondary | ICD-10-CM | POA: Diagnosis present

## 2015-01-08 DIAGNOSIS — E1142 Type 2 diabetes mellitus with diabetic polyneuropathy: Secondary | ICD-10-CM | POA: Diagnosis present

## 2015-01-08 DIAGNOSIS — Z89512 Acquired absence of left leg below knee: Secondary | ICD-10-CM | POA: Diagnosis not present

## 2015-01-08 DIAGNOSIS — E780 Pure hypercholesterolemia: Secondary | ICD-10-CM | POA: Diagnosis present

## 2015-01-08 DIAGNOSIS — Z7982 Long term (current) use of aspirin: Secondary | ICD-10-CM | POA: Diagnosis not present

## 2015-01-08 DIAGNOSIS — Z95810 Presence of automatic (implantable) cardiac defibrillator: Secondary | ICD-10-CM | POA: Diagnosis present

## 2015-01-08 DIAGNOSIS — I2510 Atherosclerotic heart disease of native coronary artery without angina pectoris: Secondary | ICD-10-CM | POA: Diagnosis present

## 2015-01-08 DIAGNOSIS — Z4781 Encounter for orthopedic aftercare following surgical amputation: Principal | ICD-10-CM

## 2015-01-08 DIAGNOSIS — D62 Acute posthemorrhagic anemia: Secondary | ICD-10-CM | POA: Diagnosis present

## 2015-01-08 DIAGNOSIS — Z794 Long term (current) use of insulin: Secondary | ICD-10-CM | POA: Diagnosis not present

## 2015-01-08 DIAGNOSIS — F0390 Unspecified dementia without behavioral disturbance: Secondary | ICD-10-CM | POA: Diagnosis present

## 2015-01-08 DIAGNOSIS — N3000 Acute cystitis without hematuria: Secondary | ICD-10-CM | POA: Diagnosis not present

## 2015-01-08 DIAGNOSIS — I10 Essential (primary) hypertension: Secondary | ICD-10-CM | POA: Diagnosis present

## 2015-01-08 DIAGNOSIS — Z89511 Acquired absence of right leg below knee: Secondary | ICD-10-CM

## 2015-01-08 DIAGNOSIS — I739 Peripheral vascular disease, unspecified: Secondary | ICD-10-CM | POA: Diagnosis present

## 2015-01-08 DIAGNOSIS — E785 Hyperlipidemia, unspecified: Secondary | ICD-10-CM | POA: Diagnosis present

## 2015-01-08 DIAGNOSIS — K219 Gastro-esophageal reflux disease without esophagitis: Secondary | ICD-10-CM | POA: Diagnosis present

## 2015-01-08 DIAGNOSIS — S88112D Complete traumatic amputation at level between knee and ankle, left lower leg, subsequent encounter: Secondary | ICD-10-CM | POA: Diagnosis not present

## 2015-01-08 DIAGNOSIS — M1990 Unspecified osteoarthritis, unspecified site: Secondary | ICD-10-CM | POA: Diagnosis present

## 2015-01-08 DIAGNOSIS — Z7901 Long term (current) use of anticoagulants: Secondary | ICD-10-CM | POA: Diagnosis not present

## 2015-01-08 DIAGNOSIS — E1165 Type 2 diabetes mellitus with hyperglycemia: Secondary | ICD-10-CM | POA: Diagnosis present

## 2015-01-08 DIAGNOSIS — Z79899 Other long term (current) drug therapy: Secondary | ICD-10-CM

## 2015-01-08 DIAGNOSIS — E1040 Type 1 diabetes mellitus with diabetic neuropathy, unspecified: Secondary | ICD-10-CM | POA: Diagnosis not present

## 2015-01-08 LAB — PROTIME-INR
INR: 1.67 — AB (ref 0.00–1.49)
Prothrombin Time: 19.9 seconds — ABNORMAL HIGH (ref 11.6–15.2)

## 2015-01-08 LAB — GLUCOSE, CAPILLARY
GLUCOSE-CAPILLARY: 175 mg/dL — AB (ref 65–99)
Glucose-Capillary: 219 mg/dL — ABNORMAL HIGH (ref 65–99)
Glucose-Capillary: 281 mg/dL — ABNORMAL HIGH (ref 65–99)

## 2015-01-08 MED ORDER — POLYSACCHARIDE IRON COMPLEX 150 MG PO CAPS
150.0000 mg | ORAL_CAPSULE | Freq: Every day | ORAL | Status: DC
  Administered 2015-01-09 – 2015-01-15 (×7): 150 mg via ORAL
  Filled 2015-01-08 (×9): qty 1

## 2015-01-08 MED ORDER — SENNOSIDES-DOCUSATE SODIUM 8.6-50 MG PO TABS
1.0000 | ORAL_TABLET | Freq: Two times a day (BID) | ORAL | Status: DC
  Administered 2015-01-08 – 2015-01-15 (×14): 1 via ORAL
  Filled 2015-01-08 (×17): qty 1

## 2015-01-08 MED ORDER — INSULIN ASPART 100 UNIT/ML ~~LOC~~ SOLN
0.0000 [IU] | Freq: Three times a day (TID) | SUBCUTANEOUS | Status: DC
Start: 2015-01-08 — End: 2015-01-12
  Administered 2015-01-08 – 2015-01-10 (×6): 5 [IU] via SUBCUTANEOUS
  Administered 2015-01-10: 6 [IU] via SUBCUTANEOUS
  Administered 2015-01-11: 3 [IU] via SUBCUTANEOUS
  Administered 2015-01-11: 5 [IU] via SUBCUTANEOUS
  Administered 2015-01-11: 3 [IU] via SUBCUTANEOUS
  Administered 2015-01-12: 8 [IU] via SUBCUTANEOUS
  Administered 2015-01-12: 3 [IU] via SUBCUTANEOUS
  Administered 2015-01-12: 5 [IU] via SUBCUTANEOUS

## 2015-01-08 MED ORDER — FUROSEMIDE 40 MG PO TABS
40.0000 mg | ORAL_TABLET | Freq: Two times a day (BID) | ORAL | Status: DC
  Administered 2015-01-08 – 2015-01-13 (×11): 40 mg via ORAL
  Filled 2015-01-08 (×14): qty 1

## 2015-01-08 MED ORDER — WARFARIN - PHYSICIAN DOSING INPATIENT
Freq: Every day | Status: DC
Start: 2015-01-08 — End: 2015-01-08

## 2015-01-08 MED ORDER — SPIRONOLACTONE 12.5 MG HALF TABLET
12.5000 mg | ORAL_TABLET | Freq: Every day | ORAL | Status: DC
Start: 2015-01-08 — End: 2015-01-15
  Administered 2015-01-08 – 2015-01-14 (×7): 12.5 mg via ORAL
  Filled 2015-01-08 (×8): qty 1

## 2015-01-08 MED ORDER — ACETAMINOPHEN 325 MG PO TABS: 650.0000 mg | ORAL_TABLET | Freq: Four times a day (QID) | ORAL | Status: DC | PRN

## 2015-01-08 MED ORDER — PREGABALIN 25 MG PO CAPS
75.0000 mg | ORAL_CAPSULE | Freq: Two times a day (BID) | ORAL | Status: DC
  Administered 2015-01-08 – 2015-01-15 (×14): 75 mg via ORAL
  Filled 2015-01-08 (×14): qty 3

## 2015-01-08 MED ORDER — CARVEDILOL 12.5 MG PO TABS
12.5000 mg | ORAL_TABLET | Freq: Two times a day (BID) | ORAL | Status: DC
  Administered 2015-01-08 – 2015-01-15 (×14): 12.5 mg via ORAL
  Filled 2015-01-08 (×17): qty 1

## 2015-01-08 MED ORDER — ADULT MULTIVITAMIN W/MINERALS CH
1.0000 | ORAL_TABLET | Freq: Every day | ORAL | Status: DC
  Administered 2015-01-09 – 2015-01-15 (×7): 1 via ORAL
  Filled 2015-01-08 (×8): qty 1

## 2015-01-08 MED ORDER — POLYETHYLENE GLYCOL 3350 17 G PO PACK
17.0000 g | PACK | Freq: Every day | ORAL | Status: DC | PRN
  Filled 2015-01-08: qty 1

## 2015-01-08 MED ORDER — WARFARIN SODIUM 10 MG PO TABS
10.0000 mg | ORAL_TABLET | ORAL | Status: DC
  Administered 2015-01-08 – 2015-01-11 (×2): 10 mg via ORAL
  Filled 2015-01-08 (×3): qty 1

## 2015-01-08 MED ORDER — VITAMIN D3 25 MCG (1000 UNIT) PO TABS
1000.0000 [IU] | ORAL_TABLET | Freq: Every day | ORAL | Status: DC
  Administered 2015-01-09 – 2015-01-15 (×7): 1000 [IU] via ORAL
  Filled 2015-01-08 (×8): qty 1

## 2015-01-08 MED ORDER — ONDANSETRON HCL 4 MG/2ML IJ SOLN: 4.0000 mg | Freq: Four times a day (QID) | INTRAMUSCULAR | Status: DC | PRN

## 2015-01-08 MED ORDER — CLOPIDOGREL BISULFATE 75 MG PO TABS
75.0000 mg | ORAL_TABLET | Freq: Every day | ORAL | Status: DC
  Administered 2015-01-09 – 2015-01-15 (×7): 75 mg via ORAL
  Filled 2015-01-08 (×8): qty 1

## 2015-01-08 MED ORDER — WARFARIN - PHARMACIST DOSING INPATIENT
Freq: Every day | Status: DC
Start: 2015-01-08 — End: 2015-01-15
  Administered 2015-01-12 – 2015-01-14 (×3)

## 2015-01-08 MED ORDER — VITAMIN E 45 MG (100 UNIT) PO CAPS: 1000.0000 [IU] | ORAL_CAPSULE | Freq: Every day | ORAL | Status: DC

## 2015-01-08 MED ORDER — OXYCODONE HCL 5 MG PO TABS
5.0000 mg | ORAL_TABLET | ORAL | Status: DC | PRN
  Administered 2015-01-08 – 2015-01-10 (×3): 5 mg via ORAL
  Administered 2015-01-11: 10 mg via ORAL
  Filled 2015-01-08: qty 1
  Filled 2015-01-08: qty 2
  Filled 2015-01-08 (×2): qty 1

## 2015-01-08 MED ORDER — PANTOPRAZOLE SODIUM 40 MG PO TBEC
80.0000 mg | DELAYED_RELEASE_TABLET | Freq: Every day | ORAL | Status: DC
  Administered 2015-01-09 – 2015-01-15 (×7): 80 mg via ORAL
  Filled 2015-01-08 (×8): qty 2

## 2015-01-08 MED ORDER — WARFARIN SODIUM 7.5 MG PO TABS
15.0000 mg | ORAL_TABLET | ORAL | Status: DC
  Administered 2015-01-09 – 2015-01-12 (×3): 15 mg via ORAL
  Filled 2015-01-08 (×3): qty 2

## 2015-01-08 MED ORDER — VITAMIN E 45 MG (100 UNIT) PO CAPS
1000.0000 [IU] | ORAL_CAPSULE | Freq: Every day | ORAL | Status: DC
  Administered 2015-01-09 – 2015-01-15 (×7): 1000 [IU] via ORAL
  Filled 2015-01-08 (×9): qty 2

## 2015-01-08 MED ORDER — HYDROCODONE-ACETAMINOPHEN 7.5-325 MG PO TABS: 1.0000 | ORAL_TABLET | Freq: Four times a day (QID) | ORAL | Status: DC | PRN

## 2015-01-08 MED ORDER — POTASSIUM CHLORIDE CRYS ER 20 MEQ PO TBCR
20.0000 meq | EXTENDED_RELEASE_TABLET | Freq: Two times a day (BID) | ORAL | Status: DC
  Administered 2015-01-08 – 2015-01-15 (×14): 20 meq via ORAL
  Filled 2015-01-08 (×17): qty 1

## 2015-01-08 MED ORDER — ASPIRIN EC 81 MG PO TBEC
81.0000 mg | DELAYED_RELEASE_TABLET | Freq: Every day | ORAL | Status: DC
  Administered 2015-01-09 – 2015-01-15 (×7): 81 mg via ORAL
  Filled 2015-01-08 (×8): qty 1

## 2015-01-08 MED ORDER — INSULIN DETEMIR 100 UNIT/ML ~~LOC~~ SOLN
35.0000 [IU] | Freq: Every day | SUBCUTANEOUS | Status: DC
  Administered 2015-01-08: 35 [IU] via SUBCUTANEOUS
  Filled 2015-01-08: qty 0.35

## 2015-01-08 MED ORDER — ONDANSETRON HCL 4 MG PO TABS
4.0000 mg | ORAL_TABLET | Freq: Four times a day (QID) | ORAL | Status: DC | PRN
  Administered 2015-01-11: 4 mg via ORAL
  Filled 2015-01-08 (×2): qty 1

## 2015-01-08 MED ORDER — ATORVASTATIN CALCIUM 10 MG PO TABS
10.0000 mg | ORAL_TABLET | Freq: Every day | ORAL | Status: DC
  Administered 2015-01-09 – 2015-01-15 (×7): 10 mg via ORAL
  Filled 2015-01-08 (×9): qty 1

## 2015-01-08 MED ORDER — SORBITOL 70 % SOLN: 30.0000 mL | Freq: Every day | Status: DC | PRN

## 2015-01-08 MED ORDER — METHOCARBAMOL 500 MG PO TABS
500.0000 mg | ORAL_TABLET | Freq: Four times a day (QID) | ORAL | Status: DC | PRN
  Administered 2015-01-09: 500 mg via ORAL
  Filled 2015-01-08: qty 1

## 2015-01-08 MED ORDER — ACETAMINOPHEN 650 MG RE SUPP: 650.0000 mg | Freq: Four times a day (QID) | RECTAL | Status: DC | PRN

## 2015-01-08 NOTE — PMR Pre-admission (Signed)
PMR Admission Coordinator Pre-Admission Assessment  [name] is an 69 y.o., male MRN: [id number] DOB: Jul 29, 1946 Height:   Weight: 90.719 kg (200 lb)              Insurance Information HMO: Yes    PPO:       PCP:       IPA:       80/20:       OTHER: Group # [id number]  PRIMARY: U[employer] Medicare      Policy#: [id number]      Subscriber: [place] Name: [name] [name]      Phone#:  [phone]     Fax#:   Pre-Cert#: [id number] with follow up by on site reviewer      Employer:  Retired Benefits:  Phone #: [phone]     Name: Automated Eff. 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 right handed male with history of diabetes mellitus with peripheral neuropathy, diastolic congestive heart failure, nonischemic cardiomyopathy, PAF with ICD with chronic Coumadin, peripheral vascular disease. [name] lives with his wife used a wheelchair as well as bilateral prosthesis provided by [employer] prosthetics prior to admission. 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 He was discharged to home from latest rehabilitation admission in June 2015. Presented 01/03/2015 with developing wounds distal end of tibial osteotomy of left below-knee amputation site. Underwent revision of left BKA 01/03/2015 per Dr.Xu. Hospital course pain management. Patient  remains on aspirin and Plavix therapy as prior to admission along with chronic Coumadin resumed for PAF. Physical therapy evaluation completed. M.D. has requested physical medicine rehabilitation consult. Patient to be admitted for a comprehensive inpatient rehabilitation program.     Past Medical History  Past Medical History  Diagnosis Date  . Cardiomyopathy, nonischemic     a. Cath 2003: mild nonobstructive CAD, EF 25% at that time.  [name] PAF (paroxysmal atrial fibrillation)     a. Noted on ICD interrogation 2012;  b. coumadin d/c'd 01/2013.  [name] NSVT (nonsustained ventricular tachycardia)     a. Noted on ICD interrogation in 2011.  . High cholesterol     takes Atorvastatin daily  . PAD (peripheral artery disease)     a. 08/2013 Periph Angio/PTA: Abd Ao nl, RLE- 3v runoff, PT diff dzs, AT 90p, LLE 2v runoff, PT 100, AT 41m (diamondback ORA/chocolate balloon PTA).  . Automatic implantable cardioverter-defibrillator in situ     a. 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  . Dementia   . Arthritis   . History of blood transfusion     no abnormal reaction noted  . Complication of anesthesia   . PONV (postoperative nausea and vomiting)   .  Hypertension     takes Coreg daily  . LBBB (left bundle branch block)     takes Coumadin daily  . Anemia     takes Iron pill daily  . Constipation     takes Sennokot daily  . GERD (gastroesophageal reflux disease)     takes Nexium daily  . Chronic systolic CHF (congestive heart failure)     takes Furosemide daily as well as Aldactone  . Pneumonia     hx of > 28yr ago  . History of bronchitis     > 1 yr ago  . Headache     occasionally  . Peripheral neuropathy     hands;numbness and tingling   . Urinary frequency   . Urinary urgency   . History of kidney stones   . Type II diabetes mellitus     takes Levemir daily as well as Novolog  . CAD (coronary artery disease)     Family History  family history includes  Diabetes in his father and mother; Heart disease in his mother; Hypertension in his mother.  Prior Rehab/Hospitalizations:  Was on CIR last year 01/2014   Current Medications   Current facility-administered medications:  .  0.9 %  sodium chloride infusion, , Intravenous, Continuous, Leandrew Koyanagi, MD, Last Rate: 125 mL/hr at 01/03/15 1739 .  acetaminophen (TYLENOL) tablet 650 mg, 650 mg, Oral, Q6H PRN **OR** acetaminophen (TYLENOL) suppository 650 mg, 650 mg, Rectal, Q6H PRN, Leandrew Koyanagi, MD .  aspirin EC tablet 81 mg, 81 mg, Oral, Daily, Leandrew Koyanagi, MD, 81 mg at 01/07/15 1108 .  atorvastatin (LIPITOR) tablet 10 mg, 10 mg, Oral, Daily, Leandrew Koyanagi, MD, 10 mg at 01/07/15 1107 .  carvedilol (COREG) tablet 12.5 mg, 12.5 mg, Oral, BID WC, Nolon Nations, MD, 12.5 mg at 01/08/15 0809 .  cholecalciferol (VITAMIN D) tablet 1,000 Units, 1,000 Units, Oral, Daily, Leandrew Koyanagi, MD, 1,000 Units at 01/07/15 1108 .  clopidogrel (PLAVIX) tablet 75 mg, 75 mg, Oral, Q breakfast, Leandrew Koyanagi, MD, 75 mg at 01/08/15 0810 .  diphenhydrAMINE (BENADRYL) 12.5 MG/5ML elixir 25 mg, 25 mg, Oral, Q4H PRN, Leandrew Koyanagi, MD .  furosemide (LASIX) tablet 40 mg, 40 mg, Oral, BID, Leandrew Koyanagi, MD, 40 mg at 01/07/15 2212 .  insulin aspart (novoLOG) injection 0-15 Units, 0-15 Units, Subcutaneous, TID WC, Naiping Ephriam Jenkins, MD, 3 Units at 01/08/15 [phone]  insulin aspart (novoLOG) injection 0-5 Units, 0-5 Units, Subcutaneous, QHS, Leandrew Koyanagi, MD, 2 Units at 01/08/15 0025 .  insulin detemir (LEVEMIR) injection 35 Units, 35 Units, Subcutaneous, QHS, Leandrew Koyanagi, MD, 35 Units at 01/08/15 0025 .  iron polysaccharides (NIFEREX) capsule 150 mg, 150 mg, Oral, Daily, Leandrew Koyanagi, MD, 150 mg at 01/07/15 1107 .  lactated ringers infusion, , Intravenous, Continuous, Nolon Nations, MD, Last Rate: 50 mL/hr at 01/03/15 0908 .  menthol-cetylpyridinium (CEPACOL) lozenge 3 mg, 1 lozenge, Oral, PRN, Pete Pelt, PA-C .  methocarbamol  (ROBAXIN) tablet 500 mg, 500 mg, Oral, Q6H PRN, 500 mg at 01/05/15 1740 **OR** methocarbamol (ROBAXIN) 500 mg in dextrose 5 % 50 mL IVPB, 500 mg, Intravenous, Q6H PRN, Leandrew Koyanagi, MD .  metoCLOPramide (REGLAN) tablet 5-10 mg, 5-10 mg, Oral, Q8H PRN **OR** metoCLOPramide (REGLAN) injection 5-10 mg, 5-10 mg, Intravenous, Q8H PRN, Leandrew Koyanagi, MD .  morphine 2 MG/ML injection 1 mg, 1 mg, Intravenous, Q2H PRN, Leandrew Koyanagi, MD, 1 mg at 01/03/15  1740 .  multivitamin with minerals tablet 1 tablet, 1 tablet, Oral, Daily, Leandrew Koyanagi, MD, 1 tablet at 01/07/15 1108 .  ondansetron (ZOFRAN) tablet 4 mg, 4 mg, Oral, Q6H PRN **OR** ondansetron (ZOFRAN) injection 4 mg, 4 mg, Intravenous, Q6H PRN, Leandrew Koyanagi, MD, 4 mg at 01/03/15 1739 .  oxyCODONE (Oxy IR/ROXICODONE) immediate release tablet 5-10 mg, 5-10 mg, Oral, Q3H PRN, Leandrew Koyanagi, MD, 10 mg at 01/07/15 0902 .  pantoprazole (PROTONIX) EC tablet 80 mg, 80 mg, Oral, Q1200, Leandrew Koyanagi, MD, 80 mg at 01/07/15 1107 .  polyethylene glycol (MIRALAX / GLYCOLAX) packet 17 g, 17 g, Oral, Daily PRN, Leandrew Koyanagi, MD .  potassium chloride SA (K-DUR,KLOR-CON) CR tablet 20 mEq, 20 mEq, Oral, BID, Leandrew Koyanagi, MD, 20 mEq at 01/07/15 2212 .  pregabalin (LYRICA) capsule 75 mg, 75 mg, Oral, BID, Leandrew Koyanagi, MD, 75 mg at 01/07/15 2212 .  senna-docusate (Senokot-S) tablet 1 tablet, 1 tablet, Oral, BID, Leandrew Koyanagi, MD, 1 tablet at 01/07/15 2212 .  sorbitol 70 % solution 30 mL, 30 mL, Oral, Daily PRN, Leandrew Koyanagi, MD .  spironolactone (ALDACTONE) tablet 12.5 mg, 12.5 mg, Oral, QHS, Leandrew Koyanagi, MD, 12.5 mg at 01/07/15 2212 .  vitamin E capsule 1,000 Units, 1,000 Units, Oral, Daily, Leandrew Koyanagi, MD, 1,000 Units at 01/07/15 1107 .  warfarin (COUMADIN) tablet 10 mg, 10 mg, Oral, Once per day on Mon Thu Sat, Naiping M Xu, MD, 10 mg at 01/06/15 1722 .  warfarin (COUMADIN) tablet 15 mg, 15 mg, Oral, Once per day on Sun Tue Wed Fri, Naiping M Xu, MD, 15 mg at 01/07/15  1900 .  Warfarin - Physician Dosing Inpatient, , Does not apply, q1800, Naiping Ephriam Jenkins, MD  Patients Current Diet: Diet Carb Modified Fluid consistency:: Thin; Room service appropriate?: Yes Diet - low sodium heart healthy Diet general  Precautions / Restrictions Precautions:  Wife reports patient has demential which comes and goes. Precautions: Fall Restrictions Weight Bearing Restrictions: Yes LLE Weight Bearing: Non weight bearing   Prior Activity Level Community (5-7x/wk): Goes out most days in his wheelchair.   Home Assistive Devices / Equipment Home Equipment: Environmental consultant - 2 wheels, Wheelchair - manual, Bedside commode  Prior Functional Level Prior Function Level of Independence: Independent with assistive device(s), Needs assistance Gait / Transfers Assistance Needed: Pt reports donning Bil prostheses everyday and uses RW for ambulation. Uses w/c for longer distances.  ADL's / Homemaking Assistance Needed: Wife does all IADLs. When wife is working, pt's brother stays with him per pt report. Comments: Pt reports Mod I for ADLs however per note ~11 months ago, wife reports pt requires assist with getting into bathroom and assist with bathing? not sure of accuracy of PLOF, hx of dementia?  Current Functional Level Cognition  Overall Cognitive Status: No family/caregiver present to determine baseline cognitive functioning (h/o dementia per chart) Orientation Level: Oriented X4    Extremity Assessment (includes Sensation/Coordination)  Upper Extremity Assessment: Generalized weakness  Lower Extremity Assessment: Defer to PT evaluation RLE Deficits / Details: Grossly ~3+/5 throughout hip, knee. LLE Deficits / Details: Able to perform SLR. Unable to fully active quadriceps to perform QS. Grossly ~3+/5 hip ext, hip flexion, knee MMT not assessed due to pain. LLE Sensation: decreased light touch    ADLs  Overall ADL's : Needs assistance/impaired General ADL Comments: Patient found  seated EOB performing bathing and dressing tasks. Patient threaded BLEs into underwear  then worked on lateral leans to pull pants over waist, pt min assist for this secondary to needing to stand to pull up underwear entire way. Patient mod +2 for sit<>stand and min +2 for short distance functional ambulation using RW. Cues required for technique, safety, hand placement during all transfers. Patient performed 2 sets of 10 recliner pushups with min encouragement. Patient motivated and willing to work hard with therapies. Patient eager to go to CIR prior to discharging home.     Mobility  Overal bed mobility: Needs Assistance Bed Mobility: Supine to Sit Supine to sit: HOB elevated, Max assist Sit to supine: Min guard General bed mobility comments: Patient sitting EOB and washing up upon arrival    Transfers  Overall transfer level: Needs assistance Equipment used: Rolling walker (2 wheeled) Transfers: Sit to/from Stand Sit to Stand: +2 physical assistance, Mod assist Squat pivot transfers: Mod assist General transfer comment: Mod A +2 to power up into standing and translate weight anteriorly over BOS. Cues for safe technique and LE positioning. Patient with better control of descent onto recliner this session    Ambulation / Gait / Stairs / Wheelchair Mobility  Ambulation/Gait Ambulation/Gait assistance: +2 safety/equipment, +2 physical assistance, Min assist Ambulation Distance (Feet): 10 Feet Assistive device: Rolling walker (2 wheeled) General Gait Details: Pt with Min A to help take small hops. Fatigues easily. Min A to ensure balance as patient appeared with more weakness with steps this session Gait Pattern/deviations: Step-to pattern Gait velocity interpretation: Below normal speed for age/gender    Posture / Balance Dynamic Sitting Balance Sitting balance - Comments: Initially requiring Mod A for static sitting balance improving to Min guard assist with proper positioning and weight  shift. Able to perform dynamic sitting guarded without LOB posteriorly. ABle to donn prosthesis with Min A for balance.  Balance Overall balance assessment: Needs assistance Sitting-balance support: No upper extremity supported, Feet unsupported Sitting balance-Leahy Scale: Good Sitting balance - Comments: Initially requiring Mod A for static sitting balance improving to Min guard assist with proper positioning and weight shift. Able to perform dynamic sitting guarded without LOB posteriorly. ABle to donn prosthesis with Min A for balance.  Postural control: Posterior lean Standing balance support: Bilateral upper extremity supported, During functional activity Standing balance-Leahy Scale: Poor Standing balance comment: Relient on RW.     Special needs/care consideration BiPAP/CPAP No CPM No Continuous Drip IV No Dialysis No         Life Vest No Oxygen No Special Bed No Trach Size No Wound Vac (area) No     Skin L BKA incision with compression wrap in place.  Wife reports patient has a wound on his bottom at times.                             Bowel mgmt: Last documented BM 01/02/15, but patient says he had a BM yesterday Bladder mgmt: Voiding in urinal.  Wife reports patient wears depends at home and that she will bring some depends to hospital. Diabetic mgmt Yes, on insulin at home Prostheses:  Has bilateral LE prostheses    Previous Home Environment Living Arrangements: Spouse/significant other Available Help at Discharge: Family, Available 24 hours/day Type of Home: House Home Layout: One level Home Access: Stairs to enter Entrance Stairs-Rails: Right Entrance Stairs-Number of Steps: 2 Bathroom Shower/Tub: Tub/shower unit, Architectural technologist: Handicapped height Additional Comments: Pt was sponge bathing Prior to admit  Discharge Living  Setting Plans for Discharge Living Setting: Patient's home, House, Lives with (comment) (Lives with wife and 59 yr old  daughter.) Type of Home at Discharge: House Discharge Home Layout: Two level, Able to live on main level with bedroom/bathroom Alternate Level Stairs-Number of Steps: Flight Discharge Home Access: Stairs to enter [employer] of Steps: 2 steps but they do have a protable ramp to go over the 2 steps.  Social/Family/Support Systems Patient Roles: Spouse, Parent (Has a wife and a daughter.) Contact Information: Kyrillos Adams - wife Anticipated Caregiver: wife Anticipated Caregiver's Contact Information: See emergency contacts. Ability/Limitations of Caregiver: Wife is retired and can provide some supervision.  Dtr works Warehouse manager and is in and out. Caregiver Availability: 24/7 Discharge Plan Discussed with Primary Caregiver: Yes Is Caregiver In Agreement with Plan?: Yes Does Caregiver/Family have Issues with Lodging/Transportation while Pt is in Rehab?: No  Goals/Additional Needs Patient/Family Goal for Rehab: PT/OT mod I goals Expected length of stay: 10-14 days Cultural Considerations: [employer] Dietary Needs: Carb mod med cal thin liquids Equipment Needs: TBD Pt/Family Agrees to Admission and willing to participate: Yes Program Orientation Provided & Reviewed with Pt/Caregiver Including Roles  & Responsibilities: Yes  Decrease burden of Care through IP rehab admission: N/A  Possible need for SNF placement upon discharge: Not planned  Patient Condition: This patient's medical and functional status has changed since the consult dated: 01/05/15 in which the Rehabilitation Physician determined and documented that the patient's condition is appropriate for intensive rehabilitative care in an inpatient rehabilitation facility. See "History of Present Illness" (above) for medical update. Functional changes are:  Currently requiring mod assist +2 for transfers and ambulated min assist +2 10 ft RW. Patient's medical and functional status update has been discussed with the Rehabilitation  physician and patient remains appropriate for inpatient rehabilitation. Will admit to inpatient rehab today.  Preadmission Screen Completed By:  [name], 01/08/2015 10:46 AM ______________________________________________________________________   Discussed status with Dr. Naaman Plummer on 01/08/15 at 1046 and received telephone approval for admission today.  Admission Coordinator:  [name], time1046/Date05/16/16

## 2015-01-08 NOTE — Progress Notes (Signed)
ANTICOAGULATION CONSULT NOTE - Follow Up Consult  Pharmacy Consult for coumadin Indication: atrial fibrillation  No Known Allergies  Patient Measurements: Height: 5\' 9"  (175.3 cm) Weight: 187 lb 6.3 oz (85 kg) IBW/kg (Calculated) : 70.7 Heparin Dosing Weight:   Vital Signs: Temp: 97.8 F (36.6 C) (05/16 1335) Temp Source: Oral (05/16 1335) BP: 123/68 mmHg (05/16 1335) Pulse Rate: 81 (05/16 1335)  Labs:  Recent Labs  01/06/15 0736 01/08/15 0609  LABPROT 16.7* 19.9*  INR 1.34 1.67*    Estimated Creatinine Clearance: 59.5 mL/min (by C-G formula based on Cr of 1.23).   Medications:  Scheduled:  . [START ON 01/09/2015] aspirin  81 mg Oral Daily  . [START ON 01/09/2015] atorvastatin  10 mg Oral Daily  . carvedilol  12.5 mg Oral BID WC  . [START ON 01/09/2015] cholecalciferol  1,000 Units Oral Daily  . [START ON 01/09/2015] clopidogrel  75 mg Oral Q breakfast  . furosemide  40 mg Oral BID  . insulin aspart  0-15 Units Subcutaneous TID WC  . insulin detemir  35 Units Subcutaneous QHS  . [START ON 01/09/2015] iron polysaccharides  150 mg Oral Daily  . [START ON 01/09/2015] multivitamin with minerals  1 tablet Oral Daily  . [START ON 01/09/2015] pantoprazole  80 mg Oral Q1200  . potassium chloride SA  20 mEq Oral BID  . pregabalin  75 mg Oral BID  . senna-docusate  1 tablet Oral BID  . spironolactone  12.5 mg Oral QHS  . [START ON 01/09/2015] vitamin E  1,000 Units Oral Daily  . warfarin  10 mg Oral Once per day on Mon Thu Sat  . [START ON 01/09/2015] warfarin  15 mg Oral Once per day on Sun Tue Wed Fri  . Warfarin - Pharmacist Dosing Inpatient   Does not apply q1800   Infusions:    Assessment: 69 yo male with hx of afib and now s/p ortho surgery was transferred to rehab and will be continued on coumadin therapy.  Now the coumadin is per pharmacy dosing.  INR today is 1.67.  Patient's home regimen was coumadin 15 mg po daily except 10 mg on MThSa which was being continued while  on the ortho floor.  Goal of Therapy:  INR 2-3 Monitor platelets by anticoagulation protocol: Yes   Plan:  - Cont Warf 15 mg daily EXCEPT for 10 mg on Mon/Thur/Sat  - Daily PT/INR  Ceciley Buist, Tsz-Yin 01/08/2015,2:11 PM

## 2015-01-08 NOTE — Progress Notes (Signed)
[name] Diones, RN Rehab Admission Coordinator Signed Physical Medicine and Rehabilitation PMR Pre-admission 01/08/2015 10:27 AM  Related encounter: Admission (Discharged) from 01/03/2015 in [place] Collapse All   PMR Admission Coordinator Pre-Admission Assessment  [name] is an 69 y.o., male MRN: [id number] DOB: [birth year] Height:   Weight: 90.719 kg (200 lb)  Insurance Information HMO: Yes PPO: PCP: IPA: 80/20: OTHER: Group # [id number]  PRIMARY: U[employer] Medicare Policy#: [id number] Subscriber: [name] [name] Name: [name] [name] Phone#: [phone] Fax#:  Pre-Cert#: [id number] with follow up by on site reviewer Employer: Retired Benefits: Phone #: [phone] Name: Automated Eff. 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 right handed male with history of diabetes mellitus with peripheral neuropathy, diastolic congestive heart failure, nonischemic cardiomyopathy, PAF with ICD with chronic Coumadin, peripheral vascular disease. [name] lives with his wife used a wheelchair as well as bilateral prosthesis provided by [employer] prosthetics prior to admission.  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 He was discharged to home from latest rehabilitation admission in June 2015. Presented 01/03/2015 with developing wounds distal end of tibial osteotomy of left below-knee amputation site. Underwent revision of left BKA 01/03/2015 per Dr.Xu. Hospital course pain management. Patient remains on aspirin and Plavix therapy as prior to admission along with chronic Coumadin resumed for PAF. Physical therapy evaluation completed. M.D. has requested physical medicine rehabilitation consult. Patient to be admitted for a comprehensive inpatient rehabilitation program.    Past Medical History  Past Medical History  Diagnosis Date  . Cardiomyopathy, nonischemic     a. Cath 2003: mild nonobstructive CAD, EF 25% at that time.  [name] PAF (paroxysmal atrial fibrillation)     a. Noted on ICD interrogation 2012; b. coumadin d/c'd 01/2013.  [name] NSVT (nonsustained ventricular tachycardia)     a. Noted on ICD interrogation in 2011.  . High cholesterol     takes Atorvastatin daily  . PAD (peripheral artery disease)     a. 08/2013 Periph Angio/PTA: Abd Ao nl, RLE- 3v runoff, PT diff dzs, AT 90p, LLE 2v runoff, PT 100, AT 7m (diamondback ORA/chocolate balloon PTA).  . Automatic implantable cardioverter-defibrillator in situ     a. 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  . Dementia   . Arthritis   . History of blood transfusion     no abnormal reaction noted  . Complication of anesthesia   . PONV (postoperative nausea and vomiting)   . Hypertension     takes Coreg daily  . LBBB (left bundle branch block)     takes Coumadin daily  . Anemia     takes Iron pill daily  . Constipation     takes Sennokot daily  . GERD (gastroesophageal reflux disease)     takes Nexium  daily  . Chronic systolic CHF (congestive heart failure)  takes Furosemide daily as well as Aldactone  . Pneumonia     hx of > 71yr ago  . History of bronchitis     > 1 yr ago  . Headache     occasionally  . Peripheral neuropathy     hands;numbness and tingling   . Urinary frequency   . Urinary urgency   . History of kidney stones   . Type II diabetes mellitus     takes Levemir daily as well as Novolog  . CAD (coronary artery disease)     Family History  family history includes Diabetes in his father and mother; Heart disease in his mother; Hypertension in his mother.  Prior Rehab/Hospitalizations: Was on CIR last year 01/2014  Current Medications   Current facility-administered medications:  . 0.9 % sodium chloride infusion, , Intravenous, Continuous, [name] Koyanagi, MD, Last Rate: 125 mL/hr at 01/03/15 1739 . acetaminophen (TYLENOL) tablet 650 mg, 650 mg, Oral, Q6H PRN **OR** acetaminophen (TYLENOL) suppository 650 mg, 650 mg, Rectal, Q6H PRN, [name] Koyanagi, MD . aspirin EC tablet 81 mg, 81 mg, Oral, Daily, [name] Koyanagi, MD, 81 mg at 01/07/15 1108 . atorvastatin (LIPITOR) tablet 10 mg, 10 mg, Oral, Daily, [name] Koyanagi, MD, 10 mg at 01/07/15 1107 . carvedilol (COREG) tablet 12.5 mg, 12.5 mg, Oral, BID WC, [name] Nations, MD, 12.5 mg at 01/08/15 0809 . cholecalciferol (VITAMIN D) tablet 1,000 Units, 1,000 Units, Oral, Daily, [name] Koyanagi, MD, 1,000 Units at 01/07/15 1108 . clopidogrel (PLAVIX) tablet 75 mg, 75 mg, Oral, Q breakfast, [name] Koyanagi, MD, 75 mg at 01/08/15 0810 . diphenhydrAMINE (BENADRYL) 12.5 MG/5ML elixir 25 mg, 25 mg, Oral, Q4H PRN, [name] Koyanagi, MD . furosemide (LASIX) tablet 40 mg, 40 mg, Oral, BID, [name] Koyanagi, MD, 40 mg at 01/07/15 2212 . insulin aspart (novoLOG) injection 0-15 Units, 0-15 Units, Subcutaneous, TID WC, [name] Ephriam Jenkins, MD, 3 Units at 01/08/15 [phone] . insulin aspart (novoLOG)  injection 0-5 Units, 0-5 Units, Subcutaneous, QHS, [name] Koyanagi, MD, 2 Units at 01/08/15 0025 . insulin detemir (LEVEMIR) injection 35 Units, 35 Units, Subcutaneous, QHS, [name] Koyanagi, MD, 35 Units at 01/08/15 0025 . iron polysaccharides (NIFEREX) capsule 150 mg, 150 mg, Oral, Daily, [name] Koyanagi, MD, 150 mg at 01/07/15 1107 . lactated ringers infusion, , Intravenous, Continuous, [name] Nations, MD, Last Rate: 50 mL/hr at 01/03/15 0908 . menthol-cetylpyridinium (CEPACOL) lozenge 3 mg, 1 lozenge, Oral, PRN, [name] Pelt, PA-C . methocarbamol (ROBAXIN) tablet 500 mg, 500 mg, Oral, Q6H PRN, 500 mg at 01/05/15 1740 **OR** methocarbamol (ROBAXIN) 500 mg in dextrose 5 % 50 mL IVPB, 500 mg, Intravenous, Q6H PRN, [name] Koyanagi, MD . metoCLOPramide (REGLAN) tablet 5-10 mg, 5-10 mg, Oral, Q8H PRN **OR** metoCLOPramide (REGLAN) injection 5-10 mg, 5-10 mg, Intravenous, Q8H PRN, [name] Koyanagi, MD . morphine 2 MG/ML injection 1 mg, 1 mg, Intravenous, Q2H PRN, [name] Koyanagi, MD, 1 mg at 01/03/15 1740 . multivitamin with minerals tablet 1 tablet, 1 tablet, Oral, Daily, [name] Koyanagi, MD, 1 tablet at 01/07/15 1108 . ondansetron (ZOFRAN) tablet 4 mg, 4 mg, Oral, Q6H PRN **OR** ondansetron (ZOFRAN) injection 4 mg, 4 mg, Intravenous, Q6H PRN, [name] Koyanagi, MD, 4 mg at 01/03/15 1739 . oxyCODONE (Oxy IR/ROXICODONE) immediate release tablet 5-10 mg, 5-10 mg, Oral, Q3H PRN, [name] Koyanagi, MD, 10 mg at 01/07/15 0902 . pantoprazole (PROTONIX) EC tablet 80 mg, 80 mg, Oral, Q1200, [name] Koyanagi, MD,  80 mg at 01/07/15 1107 . polyethylene glycol (MIRALAX / GLYCOLAX) packet 17 g, 17 g, Oral, Daily PRN, [name] Koyanagi, MD . potassium chloride SA (K-DUR,KLOR-CON) CR tablet 20 mEq, 20 mEq, Oral, BID, [name] Koyanagi, MD, 20 mEq at 01/07/15 2212 . pregabalin (LYRICA) capsule 75 mg, 75 mg, Oral, BID, [name] Koyanagi, MD, 75 mg at 01/07/15 2212 . senna-docusate (Senokot-S) tablet 1 tablet, 1 tablet, Oral, BID, [name] Koyanagi, MD, 1  tablet at 01/07/15 2212 . sorbitol 70 % solution 30 mL, 30 mL, Oral, Daily PRN, [name] Koyanagi, MD . spironolactone (ALDACTONE) tablet 12.5 mg, 12.5 mg, Oral, QHS, [name] Koyanagi, MD, 12.5 mg at 01/07/15 2212 . vitamin E capsule 1,000 Units, 1,000 Units, Oral, Daily, [name] Koyanagi, MD, 1,000 Units at 01/07/15 1107 . warfarin (COUMADIN) tablet 10 mg, 10 mg, Oral, Once per day on Mon Thu Sat, [name] M Xu, MD, 10 mg at 01/06/15 1722 . warfarin (COUMADIN) tablet 15 mg, 15 mg, Oral, Once per day on Sun Tue Wed Fri, [name] M Xu, MD, 15 mg at 01/07/15 1900 . Warfarin - Physician Dosing Inpatient, , Does not apply, q1800, [name] Ephriam Jenkins, MD  Patients Current Diet: Diet Carb Modified Fluid consistency:: Thin; Room service appropriate?: Yes Diet - low sodium heart healthy Diet general  Precautions / Restrictions Precautions: Wife reports patient has demential which comes and goes. Precautions: Fall Restrictions Weight Bearing Restrictions: Yes LLE Weight Bearing: Non weight bearing   Prior Activity Level Community (5-7x/wk): Goes out most days in his wheelchair.   Home Assistive Devices / Equipment Home Equipment: Environmental consultant - 2 wheels, Wheelchair - manual, Bedside commode  Prior Functional Level Prior Function Level of Independence: Independent with assistive device(s), Needs assistance Gait / Transfers Assistance Needed: Pt reports donning Bil prostheses everyday and uses RW for ambulation. Uses w/c for longer distances.  ADL's / Homemaking Assistance Needed: Wife does all IADLs. When wife is working, pt's brother stays with him per pt report. Comments: Pt reports Mod I for ADLs however per note ~11 months ago, wife reports pt requires assist with getting into bathroom and assist with bathing? not sure of accuracy of PLOF, hx of dementia?  Current Functional Level Cognition  Overall Cognitive Status: No family/caregiver present to determine baseline cognitive functioning (h/o dementia per  chart) Orientation Level: Oriented X4   Extremity Assessment (includes Sensation/Coordination)  Upper Extremity Assessment: Generalized weakness  Lower Extremity Assessment: Defer to PT evaluation RLE Deficits / Details: Grossly ~3+/5 throughout hip, knee. LLE Deficits / Details: Able to perform SLR. Unable to fully active quadriceps to perform QS. Grossly ~3+/5 hip ext, hip flexion, knee MMT not assessed due to pain. LLE Sensation: decreased light touch    ADLs  Overall ADL's : Needs assistance/impaired General ADL Comments: Patient found seated EOB performing bathing and dressing tasks. Patient threaded BLEs into underwear then worked on lateral leans to pull pants over waist, pt min assist for this secondary to needing to stand to pull up underwear entire way. Patient mod +2 for sit<>stand and min +2 for short distance functional ambulation using RW. Cues required for technique, safety, hand placement during all transfers. Patient performed 2 sets of 10 recliner pushups with min encouragement. Patient motivated and willing to work hard with therapies. Patient eager to go to CIR prior to discharging home.     Mobility  Overal bed mobility: Needs Assistance Bed Mobility: Supine to Sit Supine to sit: HOB elevated, Max assist Sit to  supine: Min guard General bed mobility comments: Patient sitting EOB and washing up upon arrival    Transfers  Overall transfer level: Needs assistance Equipment used: Rolling walker (2 wheeled) Transfers: Sit to/from Stand Sit to Stand: +2 physical assistance, Mod assist Squat pivot transfers: Mod assist General transfer comment: Mod A +2 to power up into standing and translate weight anteriorly over BOS. Cues for safe technique and LE positioning. Patient with better control of descent onto recliner this session    Ambulation / Gait / Stairs / Wheelchair Mobility  Ambulation/Gait Ambulation/Gait assistance: +2 safety/equipment, +2 physical  assistance, Min assist Ambulation Distance (Feet): 10 Feet Assistive device: Rolling walker (2 wheeled) General Gait Details: Pt with Min A to help take small hops. Fatigues easily. Min A to ensure balance as patient appeared with more weakness with steps this session Gait Pattern/deviations: Step-to pattern Gait velocity interpretation: Below normal speed for age/gender    Posture / Balance Dynamic Sitting Balance Sitting balance - Comments: Initially requiring Mod A for static sitting balance improving to Min guard assist with proper positioning and weight shift. Able to perform dynamic sitting guarded without LOB posteriorly. ABle to donn prosthesis with Min A for balance.  Balance Overall balance assessment: Needs assistance Sitting-balance support: No upper extremity supported, Feet unsupported Sitting balance-Leahy Scale: Good Sitting balance - Comments: Initially requiring Mod A for static sitting balance improving to Min guard assist with proper positioning and weight shift. Able to perform dynamic sitting guarded without LOB posteriorly. ABle to donn prosthesis with Min A for balance.  Postural control: Posterior lean Standing balance support: Bilateral upper extremity supported, During functional activity Standing balance-Leahy Scale: Poor Standing balance comment: Relient on RW.     Special needs/care consideration BiPAP/CPAP No CPM No Continuous Drip IV No Dialysis No  Life Vest No Oxygen No Special Bed No Trach Size No Wound Vac (area) No  Skin L BKA incision with compression wrap in place. Wife reports patient has a wound on his bottom at times.  Bowel mgmt: Last documented BM 01/02/15, but patient says he had a BM yesterday Bladder mgmt: Voiding in urinal. Wife reports patient wears depends at home and that she will bring some depends to hospital. Diabetic mgmt Yes, on insulin at home Prostheses: Has bilateral LE  prostheses    Previous Home Environment Living Arrangements: Spouse/significant other Available Help at Discharge: Family, Available 24 hours/day Type of Home: House Home Layout: One level Home Access: Stairs to enter Entrance Stairs-Rails: Right Entrance Stairs-Number of Steps: 2 Bathroom Shower/Tub: Tub/shower unit, Architectural technologist: Handicapped height Additional Comments: Pt was sponge bathing Prior to admit  Discharge Living Setting Plans for Discharge Living Setting: Patient's home, House, Lives with (comment) (Lives with wife and 77 yr old daughter.) Type of Home at Discharge: House Discharge Home Layout: Two level, Able to live on main level with bedroom/bathroom Alternate Level Stairs-Number of Steps: Flight Discharge Home Access: Stairs to enter [employer] of Steps: 2 steps but they do have a protable ramp to go over the 2 steps.  Social/Family/Support Systems Patient Roles: Spouse, Parent (Has a wife and a daughter.) Contact Information: [employer] Zare[employer]a - wife Anticipated Caregiver: wife Anticipated Caregiver's Contact Information: See emergency contacts. Ability/Limitations of Caregiver: Wife is retired and can provide some supervision. Dtr works Warehouse manager and is in and out. Caregiver Availability: 24/7 Discharge Plan Discussed with Primary Caregiver: Yes Is Caregiver In Agreement with Plan?: Yes Does Caregiver/Family have Issues with Lodging/Transportation while Pt is in  Rehab?: No  Goals/Additional Needs Patient/Family Goal for Rehab: PT/OT mod I goals Expected length of stay: 10-14 days Cultural Considerations: [employer] Dietary Needs: Carb mod med cal thin liquids Equipment Needs: TBD Pt/Family Agrees to Admission and willing to participate: Yes Program Orientation Provided & Reviewed with Pt/Caregiver Including Roles & Responsibilities: Yes  Decrease burden of Care through IP rehab admission: N/A  Possible need for SNF placement upon  discharge: Not planned  Patient Condition: This patient's medical and functional status has changed since the consult dated: 01/05/15 in which the Rehabilitation Physician determined and documented that the patient's condition is appropriate for intensive rehabilitative care in an inpatient rehabilitation facility. See "History of Present Illness" (above) for medical update. Functional changes are: Currently requiring mod assist +2 for transfers and ambulated min assist +2 10 ft RW. Patient's medical and functional status update has been discussed with the Rehabilitation physician and patient remains appropriate for inpatient rehabilitation. Will admit to inpatient rehab today.  Preadmission Screen Completed By: [name] [name], 01/08/2015 10:46 AM ______________________________________________________________________  Discussed status with Dr. Naaman Plummer on 01/08/15 at 1046 and received telephone approval for admission today.  Admission Coordinator: [name] [name], time1046/Date05/16/16          Cosigned by: Meredith Staggers, MD at 01/08/2015 11:59 AM  Revision History     Date/Time User Provider Type Action   01/08/2015 11:59 AM Meredith Staggers, MD Physician Cosign   01/08/2015 10:46 AM [name] Diones, RN Rehab Admission Coordinator Sign

## 2015-01-08 NOTE — Discharge Summary (Signed)
Physician Discharge Summary      Patient ID: [name] [name] MRN: [id number] DOB/AGE: [birth year] 69 y.o.  Admit date: 01/08/2015 Discharge date: 01/08/2015  Admission Diagnoses:  <principal problem not specified>  Discharge Diagnoses:  Active Problems:   * No active hospital problems. *   Past Medical History  Diagnosis Date  . Cardiomyopathy, nonischemic     a. Cath 2003: mild nonobstructive CAD, EF 25% at that time.  [name] PAF (paroxysmal atrial fibrillation)     a. Noted on ICD interrogation 2012;  b. coumadin d/c'd 01/2013.  [name] NSVT (nonsustained ventricular tachycardia)     a. Noted on ICD interrogation in 2011.  . High cholesterol     takes Atorvastatin daily  . PAD (peripheral artery disease)     a. 08/2013 Periph Angio/PTA: Abd Ao nl, RLE- 3v runoff, PT diff dzs, AT 90p, LLE 2v runoff, PT 100, AT 40m (diamondback ORA/chocolate balloon PTA).  . Automatic implantable cardioverter-defibrillator in situ     a. 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  . Dementia   . Arthritis   . History of blood transfusion     no abnormal reaction noted  . Complication of anesthesia   . PONV (postoperative nausea and vomiting)   . Hypertension     takes Coreg daily  . LBBB (left bundle branch block)     takes Coumadin daily  . Anemia     takes Iron pill daily  . Constipation     takes Sennokot daily  . GERD (gastroesophageal reflux disease)     takes Nexium daily  . Chronic systolic CHF (congestive heart failure)     takes Furosemide daily as well as Aldactone  . Pneumonia     hx of > 49yr ago  . History of bronchitis     > 1 yr ago  . Headache     occasionally  . Peripheral neuropathy     hands;numbness and tingling   . Urinary frequency   . Urinary urgency   . History of kidney stones   . Type II diabetes mellitus     takes Levemir daily as well as Novolog  . CAD (coronary artery disease)     Surgeries:  on * No surgery found *     Consultants (if any):    Discharged Condition: Improved  Hospital Course: Navy Belay Loyal is an 69 y.o. male who was admitted 01/08/2015 with a diagnosis of <principal problem not specified> and went to the operating room on * No surgery found * and underwent the above named procedures.    He was given perioperative antibiotics:  Anti-infectives    None    .  He was given sequential compression devices, early ambulation for DVT prophylaxis.  He benefited maximally from the hospital stay and there were no complications.    Recent vital signs:  Filed Vitals:   01/08/15 1335  BP: 123/68  Pulse: 81  Temp: 97.8 F (36.6 C)  Resp: 20    Recent laboratory studies:  Lab Results  Component Value Date   HGB 10.5* 01/05/2015   HGB 13.0 12/21/2014   HGB 8.8* 02/03/2014   Lab Results  Component Value Date   WBC 5.6 01/05/2015   PLT 182 01/05/2015   Lab Results  Component Value Date   INR 1.67* 01/08/2015   Lab Results  Component Value Date   NA 136 12/21/2014   K 4.5 12/21/2014   CL 108 12/21/2014  CO2 20 12/21/2014   BUN 29* 12/21/2014   CREATININE 1.23 12/21/2014   GLUCOSE 272* 12/21/2014    Discharge Medications:     Medication List    ASK your doctor about these medications        aspirin 81 MG EC tablet  Take 1 tablet (81 mg total) by mouth daily.     atorvastatin 10 MG tablet  Commonly known as:  LIPITOR  Take 1 tablet (10 mg total) by mouth daily.     carvedilol 12.5 MG tablet  Commonly known as:  COREG  Take 1 tablet (12.5 mg total) by mouth 2 (two) times daily with a meal.     cholecalciferol 1000 UNITS tablet  Commonly known as:  VITAMIN D  Take 1,000 Units by mouth daily.     clopidogrel 75 MG tablet  Commonly known as:  PLAVIX  Take 1 tablet (75 mg total) by mouth daily with breakfast.     esomeprazole 40 MG capsule  Commonly known as:  NEXIUM  Take 1 capsule (40 mg total) by mouth daily as needed (acid reflux).     furosemide 40  MG tablet  Commonly known as:  LASIX  Take 1 tablet (40 mg total) by mouth 2 (two) times daily.     HYDROcodone-acetaminophen 7.5-325 MG per tablet  Commonly known as:  NORCO  Take 1-2 tablets by mouth every 6 (six) hours as needed for moderate pain.     HYDROcodone-acetaminophen 7.5-325 MG per tablet  Commonly known as:  NORCO  Take 1-2 tablets by mouth every 6 (six) hours as needed for moderate pain.     insulin detemir 100 UNIT/ML injection  Commonly known as:  LEVEMIR  25 units subcutaneous daily and 35 units subcutaneous each bedtime     iron polysaccharides 150 MG capsule  Commonly known as:  NIFEREX  Take 1 capsule (150 mg total) by mouth daily.     multivitamin with minerals Tabs tablet  Take 1 tablet by mouth daily.     potassium chloride SA 20 MEQ tablet  Commonly known as:  K-DUR,KLOR-CON  Take 1 tablet (20 mEq total) by mouth 2 (two) times daily.     pregabalin 75 MG capsule  Commonly known as:  LYRICA  Take 1 capsule (75 mg total) by mouth 2 (two) times daily.     senna-docusate 8.6-50 MG per tablet  Commonly known as:  Senokot-S  Take 1 tablet by mouth 2 (two) times daily.     spironolactone 25 MG tablet  Commonly known as:  ALDACTONE  Take 0.5 tablets (12.5 mg total) by mouth at bedtime.     vitamin E 1000 UNIT capsule  Generic drug:  vitamin E  Take 1,000 Units by mouth daily.     warfarin 10 MG tablet  Commonly known as:  COUMADIN  Take as directed by coumadin clinic     warfarin 5 MG tablet  Commonly known as:  COUMADIN  Take as directed by Coumadin clinic        Diagnostic Studies: No results found.  Disposition: 62-Rehab Facility       Signed: Marianna Payment 01/08/2015, 1:42 PM

## 2015-01-08 NOTE — Progress Notes (Signed)
[name] Staggers, MD Physician Signed Physical Medicine and Rehabilitation Consult Note 01/05/2015 9:04 AM  Related encounter: Admission (Discharged) from 01/03/2015 in [place] Collapse All        Physical Medicine and Rehabilitation Consult Reason for Consult: Revision left BKA Referring Physician: Dr.Xu   HPI: [name] [name] is a 69 y.o. right handed male with history of nonischemic cardiomyopathy, PAF with ICD with chronic Coumadin, peripheral vascular disease. [name] [name] lives with his wife used a wheelchair as well as bilateral prosthesis provided by [employer] prosthetics prior to admission. 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 He was discharged to home from latest rehabilitation admission in June 2015. Presented 01/03/2015 with developing wounds distal end of tibial osteotomy of left below-knee amputation site. Underwent revision of left BKA 01/03/2015 per Dr.Xu. Hospital course pain management. Patient remains on aspirin and Plavix therapy as prior to admission. Physical therapy evaluation completed. M.D. has requested physical medicine rehabilitation consult.   Review of Systems  Respiratory: Positive for shortness of breath.  Cardiovascular: Positive for palpitations.  Gastrointestinal: Positive for constipation.   GERD  Neurological: Positive for headaches.  All other systems reviewed and are negative.  Past Medical History  Diagnosis Date  . Cardiomyopathy, nonischemic     a. Cath 2003: mild nonobstructive CAD, EF 25% at that time.  [name] PAF (paroxysmal atrial fibrillation)     a. Noted on ICD interrogation 2012; b. coumadin d/c'd 01/2013.  [name] NSVT (nonsustained ventricular tachycardia)     a. Noted on ICD interrogation in 2011.  . High cholesterol     takes Atorvastatin daily  . PAD  (peripheral artery disease)     a. 08/2013 Periph Angio/PTA: Abd Ao nl, RLE- 3v runoff, PT diff dzs, AT 90p, LLE 2v runoff, PT 100, AT 56m (diamondback ORA/chocolate balloon PTA).  . Automatic implantable cardioverter-defibrillator in situ     a. 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  . Dementia   . Arthritis   . History of blood transfusion     no abnormal reaction noted  . Complication of anesthesia   . PONV (postoperative nausea and vomiting)   . Hypertension     takes Coreg daily  . LBBB (left bundle branch block)     takes Coumadin daily  . Anemia     takes Iron pill daily  . Constipation     takes Sennokot daily  . GERD (gastroesophageal reflux disease)     takes Nexium daily  . Chronic systolic CHF (congestive heart failure)     takes Furosemide daily as well as Aldactone  . Pneumonia     hx of > 11yr ago  . History of bronchitis     > 1 yr ago  . Headache     occasionally  . Peripheral neuropathy     hands;numbness and tingling   . Urinary frequency   . Urinary urgency   . History of kidney stones   . Type II diabetes mellitus     takes Levemir daily as well as Novolog  . CAD (coronary artery disease)    Past Surgical History  Procedure Laterality Date  . Lithotripsy  2001  . Cervical spine surgery  1994  . Cardiac defibrillator placement  06/2009    WITH GENERATOR REPLACED; BiV ICD  . US echocardiography  03/21/2008  EF 30-35%  . Cardiovascular stress test  03/20/2009    EF 33%  . Transluminal atherectomy tibial artery Left 09/12/2013  . Toe amputation  10/04/2013    LEFT GREAT TOE AND 4TH TOE / DR Erlinda Hong  . Amputation Left 10/04/2013    Procedure: LEFT GREAT TOE AND SECOND TOE AMPUTATION; Surgeon: Marianna Payment, MD; Location: [place]; Service: Orthopedics; Laterality:  Left;  . Colonoscopy    . Leg amputation below knee Left 11/09/2013    DR Erlinda Hong  . Amputation Left 11/09/2013    Procedure: LEFT AMPUTATION BELOW KNEE; Surgeon: Marianna Payment, MD; Location: [place]; Service: Orthopedics; Laterality: Left;  . Amputation Right 01/04/2014    Procedure: Doristine Devoid second and third toe amputation; Surgeon: Marianna Payment, MD; Location: [place]; Service: Orthopedics; Laterality: Right;  . Amputation Right 01/30/2014    Procedure: RIGHT BELOW KNEE AMPUTATION; Surgeon: Marianna Payment, MD; Location: [place]; Service: Orthopedics; Laterality: Right;  . Lower extremity angiogram Bilateral 09/12/2013    Procedure: LOWER EXTREMITY ANGIOGRAM; Surgeon: Lorretta Harp, MD; Location: [facility] CATH LAB; Service: Cardiovascular; Laterality: Bilateral;  . Abdominal angiogram  09/12/2013    Procedure: ABDOMINAL ANGIOGRAM; Surgeon: Lorretta Harp, MD; Location: [facility] CATH LAB; Service: Cardiovascular;;  . Lower extremity angiogram N/A 12/29/2013    Procedure: LOWER EXTREMITY ANGIOGRAM; Surgeon: Lorretta Harp, MD; Location: [facility] CATH LAB; Service: Cardiovascular; Laterality: N/A;  . Atherectomy Right 12/29/2013    Procedure: ATHERECTOMY; Surgeon: Lorretta Harp, MD; Location: [facility] CATH LAB; Service: Cardiovascular; Laterality: Right; Anterior Tibial Artery  . Cardiac catheterization  10/01/2001    THERE WAS GLOBAL HYPOKINESIS AND EF 25%. THERE APPEARED TO BE GLOBAL DECREASE IN WALL MOTION  . Esophagogastroduodenoscopy    . Stump revision Left 01/03/2015    Procedure: LEFT BELOW KNEE AMPUTATION REVISION; Surgeon: Leandrew Koyanagi, MD; Location: [place]; Service: Orthopedics; Laterality: Left;   Family History  Problem Relation Age of Onset  . Heart disease Mother   . Hypertension Mother   . Diabetes Mother   . Diabetes Father    Social History:  reports that he has never  smoked. He has never used smokeless tobacco. He reports that he does not drink alcohol or use illicit drugs. Allergies: No Known Allergies Medications Prior to Admission  Medication Sig Dispense Refill  . aspirin 81 MG EC tablet Take 1 tablet (81 mg total) by mouth daily. 30 tablet 11  . atorvastatin (LIPITOR) 10 MG tablet Take 1 tablet (10 mg total) by mouth daily. 30 tablet 1  . carvedilol (COREG) 12.5 MG tablet Take 1 tablet (12.5 mg total) by mouth 2 (two) times daily with a meal. 60 tablet 1  . cholecalciferol (VITAMIN D) 1000 UNITS tablet Take 1,000 Units by mouth daily.    . clopidogrel (PLAVIX) 75 MG tablet Take 1 tablet (75 mg total) by mouth daily with breakfast. 30 tablet 1  . esomeprazole (NEXIUM) 40 MG capsule Take 1 capsule (40 mg total) by mouth daily as needed (acid reflux). 30 capsule 1  . furosemide (LASIX) 40 MG tablet Take 1 tablet (40 mg total) by mouth 2 (two) times daily. 60 tablet 1  . insulin detemir (LEVEMIR) 100 UNIT/ML injection 25 units subcutaneous daily and 35 units subcutaneous each bedtime 10 mL 11  . iron polysaccharides (NIFEREX) 150 MG capsule Take 1 capsule (150 mg total) by mouth daily. 30 capsule 1  . Multiple Vitamin (MULTIVITAMIN WITH MINERALS) TABS tablet Take 1 tablet by mouth daily.    [name]  potassium chloride SA (K-DUR,KLOR-CON) 20 MEQ tablet Take 1 tablet (20 mEq total) by mouth 2 (two) times daily. 60 tablet 1  . pregabalin (LYRICA) 75 MG capsule Take 1 capsule (75 mg total) by mouth 2 (two) times daily. 60 capsule 1  . senna-docusate (SENOKOT-S) 8.6-50 MG per tablet Take 1 tablet by mouth 2 (two) times daily. 60 tablet 1  . spironolactone (ALDACTONE) 25 MG tablet Take 0.5 tablets (12.5 mg total) by mouth at bedtime. 30 tablet 1  . vitamin E (VITAMIN E) 1000 UNIT capsule Take 1,000 Units by mouth daily.    [name] warfarin (COUMADIN) 10 MG tablet Take as directed by coumadin  clinic (Patient taking differently: Take 10 mg by mouth See admin instructions. 10mg  Monday, Thursday and Saturday) 100 tablet 3  . warfarin (COUMADIN) 5 MG tablet Take as directed by Coumadin clinic (Patient taking differently: Take 15 mg by mouth See admin instructions. 15mg  Sunday, Tuesday, Wednesday, Friday) 70 tablet 3    Home: Home Living Family/patient expects to be discharged to:: Private residence Living Arrangements: Spouse/significant other Available Help at Discharge: Family, Available 24 hours/day Type of Home: House Home Access: Stairs to enter [employer] of Steps: 2 Entrance Stairs-Rails: Right Home Layout: One level Home Equipment: Environmental consultant - 2 wheels, Wheelchair - manual, Bedside commode Additional Comments: Pt was sponge bathing Prior to admit  Functional History: Prior Function Level of Independence: Independent with assistive device(s), Needs assistance Gait / Transfers Assistance Needed: Pt reports donning Bil prostheses everyday and uses RW for ambulation. Uses w/c for longer distances.  ADL's / Homemaking Assistance Needed: Wife does all IADLs. When wife is working, pt's brother stays with him per pt report. Comments: Pt reports Mod I for ADLs however per note ~11 months ago, wife reports pt requires assist with getting into bathroom and assist with bathing? not sure of accuracy of PLOF, hx of dementia? Functional Status:  Mobility: Bed Mobility Overal bed mobility: Needs Assistance Bed Mobility: Supine to Sit Supine to sit: Max assist Sit to supine: Min guard General bed mobility comments: Max A to elevate trunk and scoot bottom to EOB. Poor trunk control initially in sitting. Limited by pain. Transfers Overall transfer level: Needs assistance Transfers: Squat Pivot Transfers Squat pivot transfers: Mod assist General transfer comment: Heavy mod assist > recliner from EOB while using right prosthesis.       ADL: ADL Overall ADL's :  Needs assistance/impaired General ADL Comments: Patient with decreased dynamic sitting balance EOB. Patient states he has increased pain in Left residual limb. Patient min assist for UB ADLs and mod assist for LB ADLs. Patient required min assist for donning of right prosthesis while seated EOB. Patient transferred EOB > recliner with mod assist (squat pivot technique). Patient will need 24/7 supervision/assistance post discharge.   Cognition: Cognition Overall Cognitive Status: No family/caregiver present to determine baseline cognitive functioning Orientation Level: Oriented X4 Cognition Arousal/Alertness: Awake/alert Behavior During Therapy: WFL for tasks assessed/performed Overall Cognitive Status: No family/caregiver present to determine baseline cognitive functioning  Blood pressure 120/62, pulse 85, temperature 99 F (37.2 C), temperature source Oral, resp. rate 18, weight 90.719 kg (200 lb), SpO2 93 %. Physical Exam  Constitutional: He is oriented to person, place, and time. He appears well-developed.  HENT:  Head: Normocephalic.  Eyes: EOM are normal.  Neck: Normal range of motion. Neck supple. No thyromegaly present.  Cardiovascular: Normal rate and regular rhythm.  Respiratory: Breath sounds normal. No respiratory distress.  GI: Soft. Bowel sounds  are normal. He exhibits no distension.  Neurological: He is alert and oriented to person, place, and time.  Mood is flat but appropriate.  Skin:  Left revised BKA site is dressed appropriately tender. Right BKA site well-healed     Lab Results Last 24 Hours    Results for orders placed or performed during the hospital encounter of 01/03/15 (from the past 24 hour(s))  Glucose, capillary Status: Abnormal   Collection Time: 01/04/15 11:27 AM  Result Value Ref Range   Glucose-Capillary 237 (H) 65 - 99 mg/dL   Comment 1 Repeat Test    Comment 2 Document in Chart   Protime-INR Status: None    Collection Time: 01/04/15 2:50 PM  Result Value Ref Range   Prothrombin Time 14.6 11.6 - 15.2 seconds   INR 1.13 0.00 - 1.49  Glucose, capillary Status: Abnormal   Collection Time: 01/04/15 4:01 PM  Result Value Ref Range   Glucose-Capillary 253 (H) 65 - 99 mg/dL   Comment 1 Repeat Test    Comment 2 Document in Chart   Glucose, capillary Status: Abnormal   Collection Time: 01/04/15 9:55 PM  Result Value Ref Range   Glucose-Capillary 225 (H) 65 - 99 mg/dL  Protime-INR Status: Abnormal   Collection Time: 01/05/15 3:13 AM  Result Value Ref Range   Prothrombin Time 15.4 (H) 11.6 - 15.2 seconds   INR 1.20 0.00 - 1.49  CBC Status: Abnormal   Collection Time: 01/05/15 3:13 AM  Result Value Ref Range   WBC 5.6 4.0 - 10.5 K/uL   RBC 4.35 4.22 - 5.81 MIL/uL   Hemoglobin 10.5 (L) 13.0 - 17.0 g/dL   HCT 32.4 (L) 39.0 - 52.0 %   MCV 74.5 (L) 78.0 - 100.0 fL   MCH 24.1 (L) 26.0 - 34.0 pg   MCHC 32.4 30.0 - 36.0 g/dL   RDW 15.9 (H) 11.5 - 15.5 %   Platelets 182 150 - 400 K/uL  Glucose, capillary Status: Abnormal   Collection Time: 01/05/15 6:34 AM  Result Value Ref Range   Glucose-Capillary 161 (H) 65 - 99 mg/dL      Imaging Results (Last 48 hours)    No results found.    Assessment/Plan: Diagnosis: status post left BKA revision, hx of right BKA also (on two prostheses PTA) 1. Does the need for close, 24 hr/day medical supervision in concert with the patient's rehab needs make it unreasonable for this patient to be served in a less intensive setting? Yes 2. Co-Morbidities requiring supervision/potential complications: DM2, htn, NICM, Afib 3. Due to bladder management, bowel management, safety, skin/wound care, disease management, medication administration and pain management, does the patient require 24 hr/day rehab nursing? Yes 4. Does the patient require  coordinated care of a physician, rehab nurse, PT (1-2 hrs/day, 5 days/week) and OT (1-2 hrs/day, 5 days/week) to address physical and functional deficits in the context of the above medical diagnosis(es)? Yes Addressing deficits in the following areas: balance, endurance, locomotion, strength, transferring, bowel/bladder control, bathing, dressing, feeding, grooming, toileting and psychosocial support 5. Can the patient actively participate in an intensive therapy program of at least 3 hrs of therapy per day at least 5 days per week? Yes 6. The potential for patient to make measurable gains while on inpatient rehab is excellent 7. Anticipated functional outcomes upon discharge from inpatient rehab are modified independent with PT, modified independent with OT, n/a with SLP. 8. Estimated rehab length of stay to reach the above functional goals is: 10-14  days 9. Does the patient have adequate social supports and living environment to accommodate these discharge functional goals? Yes 10. Anticipated D/C setting: Home 11. Anticipated post D/C treatments: HH therapy and Outpatient therapy 12. Overall Rehab/Functional Prognosis: excellent  RECOMMENDATIONS: This patient's condition is appropriate for continued rehabilitative care in the following setting: CIR Patient has agreed to participate in recommended program. Yes Note that insurance prior authorization may be required for reimbursement for recommended care.  Comment: Rehab Admissions Coordinator to follow up.  Thanks,  [name] Staggers, MD, [facility]     01/05/2015       Revision History     Date/Time User Provider Type Action   01/05/2015 11:09 AM [name] Staggers, MD Physician Sign   01/05/2015 9:24 AM Cathlyn Parsons, PA-C Physician Assistant Pend   View Details Report       Routing History     Date/Time From To Method   01/05/2015 11:09 AM [name] Staggers, MD [name] Staggers, MD In Basket   01/05/2015 11:09 AM  [name] Staggers, MD Josetta Huddle, MD Fax

## 2015-01-08 NOTE — Progress Notes (Signed)
Rehab admissions - I continue to await call back from insurance carrier.  Clinicals were submitted early Friday morning, but no call back yet.  I will update all once I hear back from insurance case manager.  Call me for questions.  #[phone]

## 2015-01-08 NOTE — H&P (Signed)
Physical Medicine and Rehabilitation Admission H&P   Chief complaint: Stump pain  HPI: [name] [name] is a 69 y.o. right handed male with history of diabetes mellitus with peripheral neuropathy, diastolic congestive heart failure, nonischemic cardiomyopathy, PAF with ICD with chronic Coumadin, peripheral vascular disease. [name] [name] lives with his wife used a wheelchair as well as bilateral prosthesis provided by [employer] prosthetics prior to admission. 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 He was discharged to home from latest rehabilitation admission in June 2015. Presented 01/03/2015 with developing wounds distal end of tibial osteotomy of left below-knee amputation site. Underwent revision of left BKA 01/03/2015 per Dr.Xu. Hospital course pain management. Patient remains on aspirin and Plavix therapy as prior to admission along with chronic Coumadin resumed for PAF. Physical therapy evaluation completed. M.D. has requested physical medicine rehabilitation consult. Patient was admitted for a comprehensive rehabilitation program  ROS Review of Systems  Respiratory: Positive for shortness of breath.  Cardiovascular: Positive for palpitations.  Gastrointestinal: Positive for constipation.   GERD  Neurological: Positive for headaches.  All other systems reviewed and are negative   Past Medical History  Diagnosis Date  . Cardiomyopathy, nonischemic     a. Cath 2003: mild nonobstructive CAD, EF 25% at that time.  [name] PAF (paroxysmal atrial fibrillation)     a. Noted on ICD interrogation 2012; b. coumadin d/c'd 01/2013.  [name] NSVT (nonsustained ventricular tachycardia)     a. Noted on ICD interrogation in 2011.  . High cholesterol     takes Atorvastatin daily  . PAD (peripheral artery disease)     a. 08/2013 Periph Angio/PTA: Abd Ao nl, RLE- 3v  runoff, PT diff dzs, AT 90p, LLE 2v runoff, PT 100, AT 87m (diamondback ORA/chocolate balloon PTA).  . Automatic implantable cardioverter-defibrillator in situ     a. 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  . Dementia   . Arthritis   . History of blood transfusion     no abnormal reaction noted  . Complication of anesthesia   . PONV (postoperative nausea and vomiting)   . Hypertension     takes Coreg daily  . LBBB (left bundle branch block)     takes Coumadin daily  . Anemia     takes Iron pill daily  . Constipation     takes Sennokot daily  . GERD (gastroesophageal reflux disease)     takes Nexium daily  . Chronic systolic CHF (congestive heart failure)     takes Furosemide daily as well as Aldactone  . Pneumonia     hx of > 23yr ago  . History of bronchitis     > 1 yr ago  . Headache     occasionally  . Peripheral neuropathy     hands;numbness and tingling   . Urinary frequency   . Urinary urgency   . History of kidney stones   . Type II diabetes mellitus     takes Levemir daily as well as Novolog  . CAD (coronary artery disease)    Past Surgical History  Procedure Laterality Date  . Lithotripsy  2001  . Cervical spine surgery  1994  . Cardiac defibrillator placement  06/2009    WITH GENERATOR REPLACED; BiV ICD  . US echocardiography  03/21/2008    EF 30-35%  . Cardiovascular stress test  03/20/2009    EF 33%  . Transluminal atherectomy  tibial artery Left 09/12/2013  . Toe amputation  10/04/2013    LEFT GREAT TOE AND 4TH TOE / DR Erlinda Hong  . Amputation Left 10/04/2013    Procedure: LEFT GREAT TOE AND SECOND TOE AMPUTATION; Surgeon: Marianna Payment, MD; Location: [place]; Service: Orthopedics; Laterality: Left;  . Colonoscopy    . Leg amputation below knee Left 11/09/2013     DR Erlinda Hong  . Amputation Left 11/09/2013    Procedure: LEFT AMPUTATION BELOW KNEE; Surgeon: Marianna Payment, MD; Location: [place]; Service: Orthopedics; Laterality: Left;  . Amputation Right 01/04/2014    Procedure: Doristine Devoid second and third toe amputation; Surgeon: Marianna Payment, MD; Location: [place]; Service: Orthopedics; Laterality: Right;  . Amputation Right 01/30/2014    Procedure: RIGHT BELOW KNEE AMPUTATION; Surgeon: Marianna Payment, MD; Location: [place]; Service: Orthopedics; Laterality: Right;  . Lower extremity angiogram Bilateral 09/12/2013    Procedure: LOWER EXTREMITY ANGIOGRAM; Surgeon: Lorretta Harp, MD; Location: [facility] C[facility] LAB; Service: Cardiovascular; Laterality: Bilateral;  . Abdominal angiogram  09/12/2013    Procedure: ABDOMINAL ANGIOGRAM; Surgeon: Lorretta Harp, MD; Location: [facility] CATH LAB; Service: Cardiovascular;;  . Lower extremity angiogram N/A 12/29/2013    Procedure: LOWER EXTREMITY ANGIOGRAM; Surgeon: Lorretta Harp, MD; Location: [facility] CATH LAB; Service: Cardiovascular; Laterality: N/A;  . Atherectomy Right 12/29/2013    Procedure: ATHERECTOMY; Surgeon: Lorretta Harp, MD; Location: [facility] CATH LAB; Service: Cardiovascular; Laterality: Right; Anterior Tibial Artery  . Cardiac catheterization  10/01/2001    THERE WAS GLOBAL HYPOKINESIS AND EF 25%. THERE APPEARED TO BE GLOBAL DECREASE IN WALL MOTION  . Esophagogastroduodenoscopy    . Stump revision Left 01/03/2015    Procedure: LEFT BELOW KNEE AMPUTATION REVISION; Surgeon: Leandrew Koyanagi, MD; Location: [place]; Service: Orthopedics; Laterality: Left;   Family History  Problem Relation Age of Onset  . Heart disease Mother   . Hypertension Mother   . Diabetes Mother   . Diabetes Father    Social History:  reports that he has never smoked. He has never used smokeless tobacco. He reports that he does not drink  alcohol or use illicit drugs. Allergies: No Known Allergies Medications Prior to Admission  Medication Sig Dispense Refill  . aspirin 81 MG EC tablet Take 1 tablet (81 mg total) by mouth daily. 30 tablet 11  . atorvastatin (LIPITOR) 10 MG tablet Take 1 tablet (10 mg total) by mouth daily. 30 tablet 1  . carvedilol (COREG) 12.5 MG tablet Take 1 tablet (12.5 mg total) by mouth 2 (two) times daily with a meal. 60 tablet 1  . cholecalciferol (VITAMIN D) 1000 UNITS tablet Take 1,000 Units by mouth daily.    . clopidogrel (PLAVIX) 75 MG tablet Take 1 tablet (75 mg total) by mouth daily with breakfast. 30 tablet 1  . esomeprazole (NEXIUM) 40 MG capsule Take 1 capsule (40 mg total) by mouth daily as needed (acid reflux). 30 capsule 1  . furosemide (LASIX) 40 MG tablet Take 1 tablet (40 mg total) by mouth 2 (two) times daily. 60 tablet 1  . insulin detemir (LEVEMIR) 100 UNIT/ML injection 25 units subcutaneous daily and 35 units subcutaneous each bedtime 10 mL 11  . iron polysaccharides (NIFEREX) 150 MG capsule Take 1 capsule (150 mg total) by mouth daily. 30 capsule 1  . Multiple Vitamin (MULTIVITAMIN WITH MINERALS) TABS tablet Take 1 tablet by mouth daily.    . potassium chloride SA (K-DUR,KLOR-CON) 20 MEQ tablet Take 1 tablet (20 mEq total) by mouth  2 (two) times daily. 60 tablet 1  . pregabalin (LYRICA) 75 MG capsule Take 1 capsule (75 mg total) by mouth 2 (two) times daily. 60 capsule 1  . senna-docusate (SENOKOT-S) 8.6-50 MG per tablet Take 1 tablet by mouth 2 (two) times daily. 60 tablet 1  . spironolactone (ALDACTONE) 25 MG tablet Take 0.5 tablets (12.5 mg total) by mouth at bedtime. 30 tablet 1  . vitamin E (VITAMIN E) 1000 UNIT capsule Take 1,000 Units by mouth daily.    [name] warfarin (COUMADIN) 10 MG tablet Take as directed by coumadin clinic (Patient taking differently: Take 10 mg by mouth See admin instructions. 10mg   Monday, Thursday and Saturday) 100 tablet 3  . warfarin (COUMADIN) 5 MG tablet Take as directed by Coumadin clinic (Patient taking differently: Take 15 mg by mouth See admin instructions. 15mg  Sunday, Tuesday, Wednesday, Friday) 70 tablet 3    Home: Home Living Family/patient expects to be discharged to:: Private residence Living Arrangements: Spouse/significant other Available Help at Discharge: Family, Available 24 hours/day Type of Home: House Home Access: Stairs to enter [employer] of Steps: 2 Entrance Stairs-Rails: Right Home Layout: One level Home Equipment: Environmental consultant - 2 wheels, Wheelchair - manual, Bedside commode Additional Comments: Pt was sponge bathing Prior to admit  Functional History: Prior Function Level of Independence: Independent with assistive device(s), Needs assistance Gait / Transfers Assistance Needed: Pt reports donning Bil prostheses everyday and uses RW for ambulation. Uses w/c for longer distances.  ADL's / Homemaking Assistance Needed: Wife does all IADLs. When wife is working, pt's brother stays with him per pt report. Comments: Pt reports Mod I for ADLs however per note ~11 months ago, wife reports pt requires assist with getting into bathroom and assist with bathing? not sure of accuracy of PLOF, hx of dementia?  Functional Status:  Mobility: Bed Mobility Overal bed mobility: Needs Assistance Bed Mobility: Supine to Sit Supine to sit: Max assist Sit to supine: Min guard General bed mobility comments: Max A to elevate trunk and scoot bottom to EOB. Poor trunk control initially in sitting. Limited by pain. Transfers Overall transfer level: Needs assistance Transfers: Squat Pivot Transfers Squat pivot transfers: Mod assist General transfer comment: Heavy mod assist > recliner from EOB while using right prosthesis.       ADL: ADL Overall ADL's : Needs assistance/impaired General ADL Comments: Patient with decreased dynamic  sitting balance EOB. Patient states he has increased pain in Left residual limb. Patient min assist for UB ADLs and mod assist for LB ADLs. Patient required min assist for donning of right prosthesis while seated EOB. Patient transferred EOB > recliner with mod assist (squat pivot technique). Patient will need 24/7 supervision/assistance post discharge.   Cognition: Cognition Overall Cognitive Status: No family/caregiver present to determine baseline cognitive functioning Orientation Level: Oriented X4 Cognition Arousal/Alertness: Awake/alert Behavior During Therapy: WFL for tasks assessed/performed Overall Cognitive Status: No family/caregiver present to determine baseline cognitive functioning  Physical Exam: Blood pressure 120/62, pulse 85, temperature 99 F (37.2 C), temperature source Oral, resp. rate 18, weight 90.719 kg (200 lb), SpO2 93 %. Physical Exam Constitutional: He is oriented to person, place, and time. He appears well-developed.  HENT: dentition fair, oral mucosa pink/moist Head: Normocephalic.  Eyes: EOM are normal.  Neck: Normal range of motion. Neck supple. No thyromegaly present.  Cardiovascular: Normal rate and regular rhythm. no murmur Respiratory: Breath sounds normal. No respiratory distress.  GI: Soft. Bowel sounds are normal. He exhibits no distension. Small umbilical  hernia.  Neurological: He is alert and oriented to person, place, and time. UES 5/5. RLE: 4-/5 HF, KE. 2/4 left HF. Did not test left KE. No gross sensory findings. Cognitively appropriate. Mood is flat but appropriate. he is cooperative M/S: right leg is well formed. LLE in post-op dressing--apprpriately shaped. No drainage Skin:  Right BKA site well-healed     Lab Results Last 48 Hours    Results for orders placed or performed during the hospital encounter of 01/03/15 (from the past 48 hour(s))  Glucose, capillary Status: Abnormal   Collection Time: 01/03/15 12:49 PM    Result Value Ref Range   Glucose-Capillary 174 (H) 70 - 99 mg/dL  Glucose, capillary Status: Abnormal   Collection Time: 01/03/15 9:43 PM  Result Value Ref Range   Glucose-Capillary 232 (H) 70 - 99 mg/dL  Glucose, capillary Status: Abnormal   Collection Time: 01/04/15 6:39 AM  Result Value Ref Range   Glucose-Capillary 181 (H) 65 - 99 mg/dL  Glucose, capillary Status: Abnormal   Collection Time: 01/04/15 11:27 AM  Result Value Ref Range   Glucose-Capillary 237 (H) 65 - 99 mg/dL   Comment 1 Repeat Test    Comment 2 Document in Chart   Protime-INR Status: None   Collection Time: 01/04/15 2:50 PM  Result Value Ref Range   Prothrombin Time 14.6 11.6 - 15.2 seconds   INR 1.13 0.00 - 1.49  Glucose, capillary Status: Abnormal   Collection Time: 01/04/15 4:01 PM  Result Value Ref Range   Glucose-Capillary 253 (H) 65 - 99 mg/dL   Comment 1 Repeat Test    Comment 2 Document in Chart   Glucose, capillary Status: Abnormal   Collection Time: 01/04/15 9:55 PM  Result Value Ref Range   Glucose-Capillary 225 (H) 65 - 99 mg/dL  Protime-INR Status: Abnormal   Collection Time: 01/05/15 3:13 AM  Result Value Ref Range   Prothrombin Time 15.4 (H) 11.6 - 15.2 seconds   INR 1.20 0.00 - 1.49  CBC Status: Abnormal   Collection Time: 01/05/15 3:13 AM  Result Value Ref Range   WBC 5.6 4.0 - 10.5 K/uL   RBC 4.35 4.22 - 5.81 MIL/uL   Hemoglobin 10.5 (L) 13.0 - 17.0 g/dL   HCT 32.4 (L) 39.0 - 52.0 %   MCV 74.5 (L) 78.0 - 100.0 fL   MCH 24.1 (L) 26.0 - 34.0 pg   MCHC 32.4 30.0 - 36.0 g/dL   RDW 15.9 (H) 11.5 - 15.5 %   Platelets 182 150 - 400 K/uL  Glucose, capillary Status: Abnormal   Collection Time: 01/05/15 6:34 AM  Result Value Ref Range   Glucose-Capillary 161 (H) 65 - 99 mg/dL  Glucose,  capillary Status: Abnormal   Collection Time: 01/05/15 11:11 AM  Result Value Ref Range   Glucose-Capillary 235 (H) 65 - 99 mg/dL      Imaging Results (Last 48 hours)    No results found.       Medical Problem List and Plan: 1. Functional deficits secondary to left BKA revision, history of right BKA with bilateral prosthesis 2. DVT Prophylaxis/Anticoagulation: Chronic Coumadin therapy for PAF 3. Pain Management: Lyrica 75 mg twice a day, Oxycodone and Robaxin as needed. Monitor with increased mobility 4. Acute blood loss anemia. Follow-up CBC/continue Niferex 5. Neuropsych: This patient is capable of making decisions on his own behalf. 6. Skin/Wound Care: Routine skin checks 7. Fluids/Electrolytes/Nutrition: Routine I nose with follow-up chemistries 8. Diabetes mellitus with peripheral neuropathy. Check blood sugars  before meals and at bedtime. Levemir 35 units daily. 9. Nonischemic cardiomyopathy/PAF/ICD. Coreg 12.5 mg twice a day, , Aldactone 12.5 mg daily at bedtime. Monitor with increased mobility 10. Diastolic congestive heart failure. Lasix 40 mg twice daily. Monitor for any signs of fluid overload 11. Hyperlipidemia. Lipitor 12. GERD. Protonix  Post Admission Physician Evaluation: 1. Functional deficits secondary to new left BKA revision, history of right BKA.. 2. Patient is admitted to receive collaborative, interdisciplinary care between the physiatrist, rehab nursing staff, and therapy team. 3. Patient's level of medical complexity and substantial therapy needs in context of that medical necessity cannot be provided at a lesser intensity of care such as a SNF. 4. Patient has experienced substantial functional loss from his/her baseline which was documented above under the "Functional History" and "Functional Status" headings. Judging by the patient's diagnosis, physical exam, and functional history, the patient has potential for functional progress which will  result in measurable gains while on inpatient rehab. These gains will be of substantial and practical use upon discharge in facilitating mobility and self-care at the household level. 5. Physiatrist will provide 24 hour management of medical needs as well as oversight of the therapy plan/treatment and provide guidance as appropriate regarding the interaction of the two. 6. 24 hour rehab nursing will assist with bladder management, bowel management, safety, skin/wound care, disease management, medication administration, pain management and patient education and help integrate therapy concepts, techniques,education, etc. 7. PT will assess and treat for/with: Lower extremity strength, range of motion, stamina, balance, functional mobility, safety, adaptive techniques and equipment, pain mgt, pre-prosthetic education, leisure support, . Goals are: mod I at w/c level. 8. OT will assess and treat for/with: ADL's, functional mobility, safety, upper extremity strength, adaptive techniques and equipment, wound care, pain mgt,coping skills Goals are: mod I. Therapy may proceed with showering this patient. 9. SLP will assess and treat for/with: n/a. Goals are: n/a. 10. Case Management and Social Worker will assess and treat for psychological issues and discharge planning. 11. Team conference will be held weekly to assess progress toward goals and to determine barriers to discharge. 12. Patient will receive at least 3 hours of therapy per day at least 5 days per week. 13. ELOS: 8-12 days  14. Prognosis: excellent     Meredith Staggers, MD, [place] [facility] 01/08/2015

## 2015-01-08 NOTE — Progress Notes (Signed)
Occupational Therapy Treatment Patient Details Name: [name] [name] MRN: [id number] DOB: 1946/06/13 Today's Date: 01/08/2015    History of present illness Patient is a 69 y/o male s/p left BKA revision. PMH of L BKA ~11 months ago, dementia, HTN, LBBB, CHF, PNA, peripheral neuropathy, DM and CAD.    OT comments  Patient making steady progress towards OT goals. Patient seated EOB upon OT/PT entering room. Patient performing UB/LB bathing, some dressing, and grooming tasks while seated EOB. Patient continues to be highly motivated to go to CIR and regain his independence. Encouraged patient to perform recliner pushups with RLE on ground, patient very willing.    Follow Up Recommendations  CIR;Supervision/Assistance - 24 hour    Equipment Recommendations  None recommended by OT    Recommendations for Other Services  Rehab consult     Precautions / Restrictions Precautions Precautions: Fall Restrictions Weight Bearing Restrictions: Yes LLE Weight Bearing: Non weight bearing    Mobility Bed Mobility General bed mobility comments: Patient sitting EOB and washing up upon arrival  Transfers Overall transfer level: Needs assistance Equipment used: Rolling walker (2 wheeled) Transfers: Sit to/from Stand Sit to Stand: +2 physical assistance;Mod assist General transfer comment: Mod A +2 to power up into standing and translate weight anteriorly over BOS. Cues for safe technique and LE positioning. Patient with better control of descent onto recliner this session    Balance Overall balance assessment: Needs assistance Sitting-balance support: No upper extremity supported;Feet unsupported Sitting balance-Leahy Scale: Good Standing balance support: Bilateral upper extremity supported;During functional activity Standing balance-Leahy Scale: Poor    ADL Overall ADL's : Needs assistance/impaired General ADL Comments: Patient found seated EOB performing bathing and dressing tasks.  Patient threaded BLEs into underwear then worked on lateral leans to pull pants over waist, pt min assist for this secondary to needing to stand to pull up underwear entire way. Patient mod +2 for sit<>stand and min +2 for short distance functional ambulation using RW. Cues required for technique, safety, hand placement during all transfers. Patient performed 2 sets of 10 recliner pushups with min encouragement. Patient motivated and willing to work hard with therapies. Patient eager to go to CIR prior to discharging home.      Cognition Behavior During Therapy: WFL for tasks assessed/performed Overall Cognitive Status: No family/caregiver present to determine baseline cognitive functioning (h/o dementia per chart)                 Pertinent Vitals/ Pain       Pain Assessment: Faces Faces Pain Scale: Hurts a little bit Pain Location: left residual limb Pain Descriptors / Indicators: Grimacing Pain Intervention(s): Monitored during session;Repositioned         Frequency Min 2X/week     Progress Toward Goals  OT Goals(current goals can now be found in the care plan section)  Progress towards OT goals: Progressing toward goals     Plan Discharge plan remains appropriate    Co-evaluation    PT/OT/SLP Co-Evaluation/Treatment: Yes Reason for Co-Treatment: For patient/therapist safety PT goals addressed during session: Mobility/safety with mobility;Strengthening/ROM;Proper use of DME OT goals addressed during session: ADL's and self-care;Strengthening/ROM      End of Session Equipment Utilized During Treatment: Gait belt;Rolling walker   Activity Tolerance Patient tolerated treatment well   Patient Left in chair;with call bell/phone within reach     Time: 0853-0921 OT Time Calculation (min): 28 min  Charges: OT General Charges $OT Visit: 1 Procedure OT Treatments $Self Care/Home Management : 8-22  mins  Treston Coker , MS, OTR/L, CLT Pager: [phone]  01/08/2015,  9:44 AM

## 2015-01-08 NOTE — Progress Notes (Signed)
Rehab admissions - I have approval from [facility] for acute inpatient rehab admission for today.  Bed available and will admit to inpatient rehab today.  Call me for questions.  #[phone]

## 2015-01-08 NOTE — Progress Notes (Signed)
Patient is stable.  Waiting CIR

## 2015-01-08 NOTE — Progress Notes (Signed)
Physical Therapy Treatment Patient Details Name: [name] [name] MRN: [id number] DOB: 04/08/1946 Today's Date: 01/08/2015    History of Present Illness Patient is a 69 y/o male s/p left BKA revision. PMH of L BKA ~11 months ago, dementia, HTN, LBBB, CHF, PNA, peripheral neuropathy, DM and CAD.     PT Comments    Patient highly motivated to work with therapy. Patient sitting EOB and washing himself upon entering room. Patient sitting independently EOB. Continues to require +2 for transfers and ambulation. Continue to recommend comprehensive inpatient rehab (CIR) for post-acute therapy needs.   Follow Up Recommendations  CIR     Equipment Recommendations  None recommended by PT    Recommendations for Other Services       Precautions / Restrictions Precautions Precautions: Fall Restrictions Weight Bearing Restrictions: Yes LLE Weight Bearing: Non weight bearing    Mobility  Bed Mobility               General bed mobility comments: Patient sitting EOB and washing up upon arrival  Transfers Overall transfer level: Needs assistance Equipment used: Rolling walker (2 wheeled)   Sit to Stand: +2 physical assistance;Mod assist         General transfer comment: Mod A +2 to power up into standing and translate weight anteriorly over BOS. Cues for safe technique and LE positioning. Patient with better control of descent onto recliner this session  Ambulation/Gait Ambulation/Gait assistance: +2 safety/equipment;+2 physical assistance;Min assist Ambulation Distance (Feet): 10 Feet Assistive device: Rolling walker (2 wheeled) Gait Pattern/deviations: Step-to pattern   Gait velocity interpretation: Below normal speed for age/gender General Gait Details: Pt with Min A to help take small hops. Fatigues easily. Min A to ensure balance as patient appeared with more weakness with steps this session   Stairs            Wheelchair Mobility    Modified Rankin  (Stroke Patients Only)       Balance                                    Cognition Arousal/Alertness: Awake/alert Behavior During Therapy: WFL for tasks assessed/performed Overall Cognitive Status: No family/caregiver present to determine baseline cognitive functioning (history of dementia per chart)                      Exercises Amputee Exercises Hip ABduction/ADduction: AROM;Left;10 reps Knee Flexion: AROM;Left;10 reps Knee Extension: AROM;Left;10 reps Straight Leg Raises: AROM;Left;10 reps Chair Push Up:  (see OT notes)    General Comments        Pertinent Vitals/Pain Faces Pain Scale: Hurts a little bit Pain Descriptors / Indicators: Grimacing Pain Intervention(s): Monitored during session;Repositioned    Home Living                      Prior Function            PT Goals (current goals can now be found in the care plan section) Progress towards PT goals: Progressing toward goals    Frequency  Min 3X/week    PT Plan Current plan remains appropriate    Co-evaluation   Reason for Co-Treatment: For patient/therapist safety PT goals addressed during session: Mobility/safety with mobility;Strengthening/ROM;Proper use of DME       End of Session Equipment Utilized During Treatment: Gait belt Activity Tolerance: Patient tolerated treatment well Patient left: in  chair;with call bell/phone within reach     Time: 0853-0920 PT Time Calculation (min) (ACUTE ONLY): 27 min  Charges:  $Gait Training: 8-22 mins                    G Codes:      Jacqualyn Posey 01/08/2015, 9:30 AM 01/08/2015 Jacqualyn Posey PTA [phone] pager [phone] office

## 2015-01-09 ENCOUNTER — Inpatient Hospital Stay (HOSPITAL_COMMUNITY): Payer: Medicare Other | Admitting: Rehabilitation

## 2015-01-09 ENCOUNTER — Inpatient Hospital Stay (HOSPITAL_COMMUNITY): Payer: Medicare Other | Admitting: Occupational Therapy

## 2015-01-09 DIAGNOSIS — S88112D Complete traumatic amputation at level between knee and ankle, left lower leg, subsequent encounter: Secondary | ICD-10-CM

## 2015-01-09 LAB — GLUCOSE, CAPILLARY
GLUCOSE-CAPILLARY: 238 mg/dL — AB (ref 65–99)
Glucose-Capillary: 242 mg/dL — ABNORMAL HIGH (ref 65–99)
Glucose-Capillary: 234 mg/dL — ABNORMAL HIGH (ref 65–99)
Glucose-Capillary: 331 mg/dL — ABNORMAL HIGH (ref 65–99)

## 2015-01-09 LAB — CBC WITH DIFFERENTIAL/PLATELET
BASOS ABS: 0 10*3/uL (ref 0.0–0.1)
Basophils Relative: 0 % (ref 0–1)
Eosinophils Absolute: 0.3 10*3/uL (ref 0.0–0.7)
Eosinophils Relative: 6 % — ABNORMAL HIGH (ref 0–5)
HCT: 33.4 % — ABNORMAL LOW (ref 39.0–52.0)
HEMOGLOBIN: 11.1 g/dL — AB (ref 13.0–17.0)
LYMPHS PCT: 15 % (ref 12–46)
Lymphs Abs: 0.9 10*3/uL (ref 0.7–4.0)
MCH: 24.4 pg — ABNORMAL LOW (ref 26.0–34.0)
MCHC: 33.2 g/dL (ref 30.0–36.0)
MCV: 73.6 fL — ABNORMAL LOW (ref 78.0–100.0)
Monocytes Absolute: 0.4 10*3/uL (ref 0.1–1.0)
Monocytes Relative: 7 % (ref 3–12)
NEUTROS ABS: 4.1 10*3/uL (ref 1.7–7.7)
Neutrophils Relative %: 72 % (ref 43–77)
Platelets: 237 10*3/uL (ref 150–400)
RBC: 4.54 MIL/uL (ref 4.22–5.81)
RDW: 15.7 % — AB (ref 11.5–15.5)
WBC: 5.7 10*3/uL (ref 4.0–10.5)

## 2015-01-09 LAB — COMPREHENSIVE METABOLIC PANEL
ALT: 16 U/L — AB (ref 17–63)
AST: 18 U/L (ref 15–41)
Albumin: 2.6 g/dL — ABNORMAL LOW (ref 3.5–5.0)
Alkaline Phosphatase: 73 U/L (ref 38–126)
Anion gap: 10 (ref 5–15)
BILIRUBIN TOTAL: 0.3 mg/dL (ref 0.3–1.2)
BUN: 22 mg/dL — ABNORMAL HIGH (ref 6–20)
CHLORIDE: 100 mmol/L — AB (ref 101–111)
CO2: 26 mmol/L (ref 22–32)
Calcium: 8.7 mg/dL — ABNORMAL LOW (ref 8.9–10.3)
Creatinine, Ser: 1.41 mg/dL — ABNORMAL HIGH (ref 0.61–1.24)
GFR calc Af Amer: 56 mL/min — ABNORMAL LOW (ref >60)
GFR calc non Af Amer: 49 mL/min — ABNORMAL LOW (ref >60)
Glucose, Bld: 260 mg/dL — ABNORMAL HIGH (ref 65–99)
Potassium: 4.2 mmol/L (ref 3.5–5.1)
SODIUM: 136 mmol/L (ref 135–145)
Total Protein: 6.6 g/dL (ref 6.5–8.1)

## 2015-01-09 LAB — PROTIME-INR
INR: 1.88 — AB (ref 0.00–1.49)
Prothrombin Time: 21.8 seconds — ABNORMAL HIGH (ref 11.6–15.2)

## 2015-01-09 MED ORDER — INSULIN DETEMIR 100 UNIT/ML ~~LOC~~ SOLN
38.0000 [IU] | Freq: Every day | SUBCUTANEOUS | Status: DC
  Administered 2015-01-09: 38 [IU] via SUBCUTANEOUS
  Filled 2015-01-09: qty 0.38

## 2015-01-09 NOTE — Progress Notes (Signed)
69 y.o. right handed male with history of diabetes mellitus with peripheral neuropathy, diastolic congestive heart failure, nonischemic cardiomyopathy, PAF with ICD with chronic Coumadin, peripheral vascular disease. Mr. Mabe lives with his wife used a wheelchair as well as bilateral prosthesis provided by [employer] prosthetics prior to admission. 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 He was discharged to home from latest rehabilitation admission in June 2015. Presented 01/03/2015 with developing wounds distal end of tibial osteotomy of left below-knee amputation site. Underwent revision of left BKA 01/03/2015 per Dr.Xu. Hospital course pain management. Patient remains on aspirin and Plavix therapy as prior to admission along with chronic Coumadin resumed for PAF  Subjective/Complaints: Slept well  States he had a sore that kept bleeding related to Left prosthetic use, no issues on RIght side Review of Systems - Negative except constipation and left stump pain Objective: Vital Signs: Blood pressure 120/61, pulse 79, temperature 98.5 F (36.9 C), temperature source Oral, resp. rate 17, height $RemoveBe'5\' 9"'kKvOynbZw$  (1.753 m), weight 85 kg (187 lb 6.3 oz), SpO2 98 %. No results found. Results for orders placed or performed during the hospital encounter of 01/08/15 (from the past 72 hour(s))  Glucose, capillary     Status: Abnormal   Collection Time: 01/08/15  4:27 PM  Result Value Ref Range   Glucose-Capillary 219 (H) 65 - 99 mg/dL   Comment 1 Notify RN   Glucose, capillary     Status: Abnormal   Collection Time: 01/08/15  9:12 PM  Result Value Ref Range   Glucose-Capillary 281 (H) 65 - 99 mg/dL  CBC WITH DIFFERENTIAL     Status: Abnormal   Collection Time: 01/09/15  5:40 AM  Result Value Ref Range   WBC 5.7 4.0 - 10.5 K/uL   RBC 4.54 4.22 - 5.81 MIL/uL   Hemoglobin 11.1 (L) 13.0 - 17.0 g/dL   HCT 33.4 (L) 39.0  - 52.0 %   MCV 73.6 (L) 78.0 - 100.0 fL   MCH 24.4 (L) 26.0 - 34.0 pg   MCHC 33.2 30.0 - 36.0 g/dL   RDW 15.7 (H) 11.5 - 15.5 %   Platelets 237 150 - 400 K/uL   Neutrophils Relative % 72 43 - 77 %   Neutro Abs 4.1 1.7 - 7.7 K/uL   Lymphocytes Relative 15 12 - 46 %   Lymphs Abs 0.9 0.7 - 4.0 K/uL   Monocytes Relative 7 3 - 12 %   Monocytes Absolute 0.4 0.1 - 1.0 K/uL   Eosinophils Relative 6 (H) 0 - 5 %   Eosinophils Absolute 0.3 0.0 - 0.7 K/uL   Basophils Relative 0 0 - 1 %   Basophils Absolute 0.0 0.0 - 0.1 K/uL  Comprehensive metabolic panel     Status: Abnormal   Collection Time: 01/09/15  5:40 AM  Result Value Ref Range   Sodium 136 135 - 145 mmol/L   Potassium 4.2 3.5 - 5.1 mmol/L   Chloride 100 (L) 101 - 111 mmol/L   CO2 26 22 - 32 mmol/L   Glucose, Bld 260 (H) 65 - 99 mg/dL   BUN 22 (H) 6 - 20 mg/dL   Creatinine, Ser 1.41 (H) 0.61 - 1.24 mg/dL   Calcium 8.7 (L) 8.9 - 10.3 mg/dL   Total Protein 6.6 6.5 - 8.1 g/dL   Albumin 2.6 (L) 3.5 - 5.0 g/dL   AST 18 15 - 41 U/L   ALT  16 (L) 17 - 63 U/L   Alkaline Phosphatase 73 38 - 126 U/L   Total Bilirubin 0.3 0.3 - 1.2 mg/dL   GFR calc non Af Amer 49 (L) >60 mL/min   GFR calc Af Amer 56 (L) >60 mL/min    Comment: (NOTE) The eGFR has been calculated using the CKD EPI equation. This calculation has not been validated in all clinical situations. eGFR's persistently <60 mL/min signify possible Chronic Kidney Disease.    Anion gap 10 5 - 15  Protime-INR     Status: Abnormal   Collection Time: 01/09/15  5:40 AM  Result Value Ref Range   Prothrombin Time 21.8 (H) 11.6 - 15.2 seconds   INR 1.88 (H) 0.00 - 1.49  Glucose, capillary     Status: Abnormal   Collection Time: 01/09/15  6:52 AM  Result Value Ref Range   Glucose-Capillary 242 (H) 65 - 99 mg/dL     HEENT: normal Cardio: RRR Resp: CTA B/L GI: BS positive and NT, ND Extremity:  Edema left stump Skin:   Wound Coban wrap on left BKA Neuro: Alert/Oriented and  Abnormal Motor 5/5 in BUE, 5/5 in B HF, right KE, 4- LKE Musc/Skel:  Swelling left BKA GEN NAD     Assessment/Plan: 1. Functional deficits secondary to Left BKA revision which require 3+ hours per day of interdisciplinary therapy in a comprehensive inpatient rehab setting. Physiatrist is providing close team supervision and 24 hour management of active medical problems listed below. Physiatrist and rehab team continue to assess barriers to discharge/monitor patient progress toward functional and medical goals. FIM:                   Comprehension Comprehension Mode: Auditory Comprehension: 5-Follows basic conversation/direction: With no assist  Expression Expression Mode: Verbal Expression: 4-Expresses basic 75 - 89% of the time/requires cueing 10 - 24% of the time. Needs helper to occlude trach/needs to repeat words.  Social Interaction Social Interaction: 4-Interacts appropriately 75 - 89% of the time - Needs redirection for appropriate language or to initiate interaction.  Problem Solving Problem Solving: 3-Solves basic 50 - 74% of the time/requires cueing 25 - 49% of the time  Memory Memory: 3-Recognizes or recalls 50 - 74% of the time/requires cueing 25 - 49% of the time   Medical Problem List and Plan: 1. Functional deficits secondary to left BKA revision, history of right BKA with bilateral prosthesis 2. DVT Prophylaxis/Anticoagulation: Chronic Coumadin therapy for PAF, ASA, Plavix 3. Pain Management: Lyrica 75 mg twice a day, Oxycodone and Robaxin as needed. Monitor with increased mobility 4. Acute blood loss anemia. Follow-up CBC/continue Niferex 5. Neuropsych: This patient is capable of making decisions on his own behalf. 6. Skin/Wound Care: Routine skin checks 7. Fluids/Electrolytes/Nutrition: Routine I nose with follow-up chemistries 8. Diabetes mellitus with peripheral neuropathy. Check blood sugars before meals and at bedtime. Levemir 35 units daily.am  CBG elevated, will adjust 9. Nonischemic cardiomyopathy/PAF/ICD. Coreg 12.5 mg twice a day, , Aldactone 12.5 mg daily at bedtime. Monitor with increased mobility 10. Diastolic congestive heart failure. Lasix 40 mg twice daily. Monitor for any signs of fluid overload 11. Hyperlipidemia. Lipitor 12. GERD. Protonix LOS (Days) 1 A FACE TO FACE EVALUATION WAS PERFORMED  Jini Horiuchi E 01/09/2015, 6:59 AM

## 2015-01-09 NOTE — Evaluation (Signed)
Occupational Therapy Assessment and Plan  Patient Details  Name: [name] [name] MRN: [id number] Date of Birth: [birth year]  OT Diagnosis: acute pain and muscle weakness (generalized) Rehab Potential: Rehab Potential (ACUTE ONLY): Good ELOS: 5-7 days   Today's Date: 01/09/2015 OT Individual Time: 0900-1000 OT Individual Time Calculation (min): 60 min     Problem List:  Patient Active Problem List   Diagnosis Date Noted  . Amputation of right lower extremity below knee 01/08/2015  . Below knee amputation status 01/03/2015  . Atrial fibrillation [I48.91] 11/01/2014  . Encounter for therapeutic drug monitoring 11/01/2014  . S/P bilateral BKA (below knee amputation) 02/02/2014  . Osteomyelitis of right foot 01/30/2014  . 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 01/04/2014  . Osteomyelitis of toe of right foot 01/04/2014  . S/P Lt BKA 11/09/13 11/14/2013  . Acute blood loss anemia 11/11/2013  . Type II or unspecified type diabetes mellitus 11/09/2013  . Lower limb amputation, great toe 10/11/2013  . Lower limb amputation, other toe(s) 10/11/2013  . Chronic osteomyelitis of toe of left foot 10/04/2013  . Foot osteomyelitis, left 10/04/2013  . Uncontrolled pain, Lt toe 09/21/2013  . Gangrenous toe, Lt toe 09/21/2013  . PAD (peripheral artery disease) 09/13/2013  . Diabetes mellitus 09/13/2013  . Hyperlipidemia 09/13/2013  . PAF (paroxysmal atrial fibrillation) 09/13/2013  . Critical lower limb ischemia- s/p Rt anterior tibial PTA 12/29/13 in preparation for Rt BKA 08/30/2013  . 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 HTN (hypertension) 04/08/2011  . NICM- EF 20-25% echo 6/14 09/29/2008  . LBBB 09/29/2008  . SYSTOLIC HEART FAILURE, CHRONIC 09/29/2008    Past Medical History:  Past Medical History  Diagnosis Date  . Cardiomyopathy, nonischemic     a. Cath 2003: mild nonobstructive CAD, EF 25% at that time.  [name] PAF (paroxysmal atrial fibrillation)     a. Noted  on ICD interrogation 2012;  b. coumadin d/c'd 01/2013.  [name] NSVT (nonsustained ventricular tachycardia)     a. Noted on ICD interrogation in 2011.  . High cholesterol     takes Atorvastatin daily  . PAD (peripheral artery disease)     a. 08/2013 Periph Angio/PTA: Abd Ao nl, RLE- 3v runoff, PT diff dzs, AT 90p, LLE 2v runoff, PT 100, AT 40m (diamondback ORA/chocolate balloon PTA).  . Automatic implantable cardioverter-defibrillator in situ     a. 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  . Dementia   . Arthritis   . History of blood transfusion     no abnormal reaction noted  . Complication of anesthesia   . PONV (postoperative nausea and vomiting)   . Hypertension     takes Coreg daily  . LBBB (left bundle branch block)     takes Coumadin daily  . Anemia     takes Iron pill daily  . Constipation     takes Sennokot daily  . GERD (gastroesophageal reflux disease)     takes Nexium daily  . Chronic systolic CHF (congestive heart failure)     takes Furosemide daily as well as Aldactone  . Pneumonia     hx of > 59yr ago  . History of bronchitis     > 1 yr ago  . Headache     occasionally  . Peripheral neuropathy     hands;numbness and tingling   . Urinary frequency   . Urinary urgency   . History of kidney stones   .  Type II diabetes mellitus     takes Levemir daily as well as Novolog  . CAD (coronary artery disease)    Past Surgical History:  Past Surgical History  Procedure Laterality Date  . Lithotripsy  2001  . Cervical spine surgery  1994  . Cardiac defibrillator placement  06/2009    WITH GENERATOR REPLACED; BiV ICD  . US echocardiography  03/21/2008    EF 30-35%  . Cardiovascular stress test  03/20/2009    EF 33%  . Transluminal atherectomy tibial artery Left 09/12/2013  . Toe amputation  10/04/2013    LEFT GREAT TOE AND 4TH TOE   /   DR Erlinda Hong  . Amputation Left 10/04/2013    Procedure: LEFT GREAT TOE AND SECOND TOE AMPUTATION;  Surgeon:  Marianna Payment, MD;  Location: [place];  Service: Orthopedics;  Laterality: Left;  . Colonoscopy    . Leg amputation below knee Left 11/09/2013    DR Erlinda Hong  . Amputation Left 11/09/2013    Procedure: LEFT AMPUTATION BELOW KNEE;  Surgeon: Marianna Payment, MD;  Location: [place];  Service: Orthopedics;  Laterality: Left;  . Amputation Right 01/04/2014    Procedure: Doristine Devoid second and third toe amputation;  Surgeon: Marianna Payment, MD;  Location: [place];  Service: Orthopedics;  Laterality: Right;  . Amputation Right 01/30/2014    Procedure: RIGHT BELOW KNEE AMPUTATION;  Surgeon: Marianna Payment, MD;  Location: [place];  Service: Orthopedics;  Laterality: Right;  . Lower extremity angiogram Bilateral 09/12/2013    Procedure: LOWER EXTREMITY ANGIOGRAM;  Surgeon: Lorretta Harp, MD;  Location[facility] CATH LAB;  Service: Cardiovascular;  Laterality: Bilateral;  . Abdominal angiogram  09/12/2013    Procedure: ABDOMINAL ANGIOGRAM;  Surgeon: Lorretta Harp, MD;  Location: [facility] CATH LAB;  Service: Cardiovascular;;  . Lower extremity angiogram N/A 12/29/2013    Procedure: LOWER EXTREMITY ANGIOGRAM;  Surgeon: Lorretta Harp, MD;  Location: [facility] CATH LAB;  Service: Cardiovascular;  Laterality: N/A;  . Atherectomy Right 12/29/2013    Procedure: ATHERECTOMY;  Surgeon: Lorretta Harp, MD;  Location: [facility] CATH LAB;  Service: Cardiovascular;  Laterality: Right;  Anterior Tibial Artery  . Cardiac catheterization  10/01/2001    THERE WAS GLOBAL HYPOKINESIS AND EF 25%. THERE APPEARED TO BE GLOBAL DECREASE IN WALL MOTION  . Esophagogastroduodenoscopy    . Stump revision Left 01/03/2015    Procedure: LEFT BELOW KNEE AMPUTATION REVISION;  Surgeon: Leandrew Koyanagi, MD;  Location: [place];  Service: Orthopedics;  Laterality: Left;    Assessment & Plan Clinical Impression: Patient is a 69 y.o. year old right handed male with history of diabetes mellitus with peripheral neuropathy, diastolic congestive heart failure, nonischemic  cardiomyopathy, PAF with ICD with chronic Coumadin, peripheral vascular disease. [name] [name] lives with his wife used a wheelchair as well as bilateral prosthesis provided by [employer] prosthetics prior to admission. 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 He was discharged to home from latest rehabilitation admission in June 2015. Presented 01/03/2015 with developing wounds distal end of tibial osteotomy of left below-knee amputation site. Underwent revision of left BKA 01/03/2015 per Dr.Xu. Hospital course pain management. Patient remains on aspirin and Plavix therapy as prior to admission along with chronic Coumadin resumed for PAF.  Patient transferred to CIR on 01/08/2015 .    Patient currently requires min with basic self-care skills and basic mobility secondary to  muscle weakness and acute pain, decreased cardiorespiratoy endurance and decreased sitting balance and decreased balance strategies.  Prior to hospitalization, patient could complete ADL with supervision.  Patient will benefit from skilled intervention to decrease level of assist with basic self-care skills and increase independence with basic self-care skills prior to discharge home with care partner.  Anticipate patient will require 24 hour supervision and follow up home health.  OT - End of Session Endurance Deficit: Yes OT Assessment Rehab Potential (ACUTE ONLY): Good OT Patient demonstrates impairments in the following area(s): Balance;Edema;Endurance;Motor;Pain;Safety OT Basic ADL's Functional Problem(s): Grooming;Bathing;Dressing;Toileting OT Transfers Functional Problem(s): Toilet;Tub/Shower OT Additional Impairment(s): None OT Plan OT Intensity: Minimum of 1-2 x/day, 45 to 90 minutes OT Frequency: 5 out of 7 days OT Duration/Estimated Length of Stay: 5-7 days OT Treatment/Interventions: Balance/vestibular training;Cognitive  remediation/compensation;Discharge planning;Functional mobility training;Therapeutic Activities;UE/LE Coordination activities;Patient/family education;Community reintegration;Pain management;Skin care/wound managment;DME/adaptive equipment instruction;Self Care/advanced ADL retraining;Therapeutic Exercise;Wheelchair propulsion/positioning OT Self Feeding Anticipated Outcome(s): n/a OT Basic Self-Care Anticipated Outcome(s): supervision OT Toileting Anticipated Outcome(s): supervision OT Bathroom Transfers Anticipated Outcome(s): supervision OT Recommendation Patient destination: Home Follow Up Recommendations: Home health OT Equipment Recommended: To be determined   Skilled Therapeutic Intervention 1:1 Ot eval initiated with OT goals, purpose and role discussed. Self care retraining performed at EOB with focus on bed mobility, static and dynamic sitting balance at EOB, lateral leans for perihygiene and clothing management, scoot pivot transfers with right prosthesis on with focus on w/c setup and bottom clearance with transfer, grooming sitting at sink with setup of supplies etc. Pt transitioned down the gym to continue to work on w/c<>mat transfers with min A with extra time for positioning of body and endurance to perform transfer. Pt left in room with NT in room changing linens with safety belt in place.   OT Evaluation Precautions/Restrictions  Precautions Precautions: Fall Restrictions Weight Bearing Restrictions: Yes LLE Weight Bearing: Non weight bearing General Chart Reviewed: Yes Family/Caregiver Present: No Vital Signs Therapy Vitals Temp: 98.5 F (36.9 C) Temp Source: Oral Pulse Rate: 79 Resp: 17 BP: 120/61 mmHg Patient Position (if appropriate): Lying Oxygen Therapy SpO2: 98 % O2 Device: Not Delivered Pain Pain Assessment Pain Score: 5  Pain Location: Leg Pain Orientation: Left Pain Descriptors / Indicators: Aching Pain Onset: On-going Pain Intervention(s): RN  made aware;Rest Home Living/Prior Functioning Home Living Available Help at Discharge: Family, Available 24 hours/day Type of Home: House Home Access: Stairs to enter, Ramped entrance Technical brewer of Steps: 2 Entrance Stairs-Rails: Right Home Layout: One level  Lives With: Spouse, Daughter ADL ADL ADL Comments: see FIM Vision/Perception  Vision- History Patient Visual Report: No change from baseline  Cognition Overall Cognitive Status: No family/caregiver present to determine baseline cognitive functioning Arousal/Alertness: Awake/alert Orientation Level: Oriented X4 Safety/Judgment: Impaired Sensation Sensation Light Touch: Impaired Detail Light Touch Impaired Details: Impaired RLE;Impaired LLE;Impaired RUE;Impaired LUE Proprioception: Impaired by gross assessment Coordination Gross Motor Movements are Fluid and Coordinated: Yes Fine Motor Movements are Fluid and Coordinated: Yes Motor  Motor Motor - Skilled Clinical Observations: generalized weakness and pain Mobility  Bed Mobility Bed Mobility: Supine to Sit Supine to Sit: 3: Mod assist  Trunk/Postural Assessment  Cervical Assessment Cervical Assessment: Within Functional Limits Thoracic Assessment Thoracic Assessment: Within Functional Limits Lumbar Assessment Lumbar Assessment:  (posterior pelvic tilt) Postural Control Postural Control:  (needs extra time)  Balance Balance Balance Assessed: Yes Static Sitting Balance Static Sitting - Balance Support: No upper extremity supported;Feet unsupported Static Sitting - Level of Assistance: 5: Stand by  assistance Dynamic Sitting Balance Dynamic Sitting - Balance Support: During functional activity;Feet unsupported;No upper extremity supported Dynamic Sitting - Level of Assistance: 5: Stand by assistance Extremity/Trunk Assessment RUE Assessment RUE Assessment: Within Functional Limits LUE Assessment LUE Assessment: Within Functional Limits  FIM:       Refer to Care Plan for Long Term Goals  Recommendations for other services: None  Discharge Criteria: Patient will be discharged from OT if patient refuses treatment 3 consecutive times without medical reason, if treatment goals not met, if there is a change in medical status, if patient makes no progress towards goals or if patient is discharged from hospital.  The above assessment, treatment plan, treatment alternatives and goals were discussed and mutually agreed upon: by patient  Nicoletta Ba 01/09/2015, 9:27 AM

## 2015-01-09 NOTE — Evaluation (Signed)
Physical Therapy Assessment and Plan  Patient Details  Name: [name] [name] MRN: [id number] Date of Birth: [birth year]  PT Diagnosis: Abnormal posture, Abnormality of gait and Muscle weakness (generalized) Rehab Potential: Excellent ELOS: 5 days    Today's Date: 01/09/2015 PT Individual Time: 1103-1200 and 1430-1600 PT Individual Time Calculation (min): 57 min  And 90 mins  Problem List:  Patient Active Problem List   Diagnosis Date Noted  . Amputation of right lower extremity below knee 01/08/2015  . Below knee amputation status 01/03/2015  . Atrial fibrillation [I48.91] 11/01/2014  . Encounter for therapeutic drug monitoring 11/01/2014  . S/P bilateral BKA (below knee amputation) 02/02/2014  . Osteomyelitis of right foot 01/30/2014  . 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 01/04/2014  . Osteomyelitis of toe of right foot 01/04/2014  . S/P Lt BKA 11/09/13 11/14/2013  . Acute blood loss anemia 11/11/2013  . Type II or unspecified type diabetes mellitus 11/09/2013  . Lower limb amputation, great toe 10/11/2013  . Lower limb amputation, other toe(s) 10/11/2013  . Chronic osteomyelitis of toe of left foot 10/04/2013  . Foot osteomyelitis, left 10/04/2013  . Uncontrolled pain, Lt toe 09/21/2013  . Gangrenous toe, Lt toe 09/21/2013  . PAD (peripheral artery disease) 09/13/2013  . Diabetes mellitus 09/13/2013  . Hyperlipidemia 09/13/2013  . PAF (paroxysmal atrial fibrillation) 09/13/2013  . Critical lower limb ischemia- s/p Rt anterior tibial PTA 12/29/13 in preparation for Rt BKA 08/30/2013  . 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 HTN (hypertension) 04/08/2011  . NICM- EF 20-25% echo 6/14 09/29/2008  . LBBB 09/29/2008  . SYSTOLIC HEART FAILURE, CHRONIC 09/29/2008    Past Medical History:  Past Medical History  Diagnosis Date  . Cardiomyopathy, nonischemic     a. Cath 2003: mild nonobstructive CAD, EF 25% at that time.  [name] PAF (paroxysmal atrial  fibrillation)     a. Noted on ICD interrogation 2012;  b. coumadin d/c'd 01/2013.  [name] NSVT (nonsustained ventricular tachycardia)     a. Noted on ICD interrogation in 2011.  . High cholesterol     takes Atorvastatin daily  . PAD (peripheral artery disease)     a. 08/2013 Periph Angio/PTA: Abd Ao nl, RLE- 3v runoff, PT diff dzs, AT 90p, LLE 2v runoff, PT 100, AT 64m (diamondback ORA/chocolate balloon PTA).  . Automatic implantable cardioverter-defibrillator in situ     a. 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  . Dementia   . Arthritis   . History of blood transfusion     no abnormal reaction noted  . Complication of anesthesia   . PONV (postoperative nausea and vomiting)   . Hypertension     takes Coreg daily  . LBBB (left bundle branch block)     takes Coumadin daily  . Anemia     takes Iron pill daily  . Constipation     takes Sennokot daily  . GERD (gastroesophageal reflux disease)     takes Nexium daily  . Chronic systolic CHF (congestive heart failure)     takes Furosemide daily as well as Aldactone  . Pneumonia     hx of > 39yr ago  . History of bronchitis     > 1 yr ago  . Headache     occasionally  . Peripheral neuropathy     hands;numbness and tingling   . Urinary frequency   . Urinary urgency   . History of kidney stones   .  Type II diabetes mellitus     takes Levemir daily as well as Novolog  . CAD (coronary artery disease)    Past Surgical History:  Past Surgical History  Procedure Laterality Date  . Lithotripsy  2001  . Cervical spine surgery  1994  . Cardiac defibrillator placement  06/2009    WITH GENERATOR REPLACED; BiV ICD  . US echocardiography  03/21/2008    EF 30-35%  . Cardiovascular stress test  03/20/2009    EF 33%  . Transluminal atherectomy tibial artery Left 09/12/2013  . Toe amputation  10/04/2013    LEFT GREAT TOE AND 4TH TOE   /   DR Erlinda Hong  . Amputation Left 10/04/2013    Procedure: LEFT GREAT TOE AND SECOND TOE  AMPUTATION;  Surgeon: Marianna Payment, MD;  Location: [place];  Service: Orthopedics;  Laterality: Left;  . Colonoscopy    . Leg amputation below knee Left 11/09/2013    DR Erlinda Hong  . Amputation Left 11/09/2013    Procedure: LEFT AMPUTATION BELOW KNEE;  Surgeon: Marianna Payment, MD;  Location: [place];  Service: Orthopedics;  Laterality: Left;  . Amputation Right 01/04/2014    Procedure: Doristine Devoid second and third toe amputation;  Surgeon: Marianna Payment, MD;  Location: [place];  Service: Orthopedics;  Laterality: Right;  . Amputation Right 01/30/2014    Procedure: RIGHT BELOW KNEE AMPUTATION;  Surgeon: Marianna Payment, MD;  Location: [place];  Service: Orthopedics;  Laterality: Right;  . Lower extremity angiogram Bilateral 09/12/2013    Procedure: LOWER EXTREMITY ANGIOGRAM;  Surgeon: Lorretta Harp, MD;  Location: [facility] CATH LAB;  Service: Cardiovascular;  Laterality: Bilateral;  . Abdominal angiogram  09/12/2013    Procedure: ABDOMINAL ANGIOGRAM;  Surgeon: Lorretta Harp, MD;  Location: [facility] CATH LAB;  Service: Cardiovascular;;  . Lower extremity angiogram N/A 12/29/2013    Procedure: LOWER EXTREMITY ANGIOGRAM;  Surgeon: Lorretta Harp, MD;  Location: [name] CATH LAB;  Service: Cardiovascular;  Laterality: N/A;  . Atherectomy Right 12/29/2013    Procedure: ATHERECTOMY;  Surgeon: Lorretta Harp, MD;  Location: [facility] CATH LAB;  Service: Cardiovascular;  Laterality: Right;  Anterior Tibial Artery  . Cardiac catheterization  10/01/2001    THERE WAS GLOBAL HYPOKINESIS AND EF 25%. THERE APPEARED TO BE GLOBAL DECREASE IN WALL MOTION  . Esophagogastroduodenoscopy    . Stump revision Left 01/03/2015    Procedure: LEFT BELOW KNEE AMPUTATION REVISION;  Surgeon: Leandrew Koyanagi, MD;  Location: [place];  Service: Orthopedics;  Laterality: Left;    Assessment & Plan Clinical Impression: Patient is a 69 y.o. year old male with with history of diabetes mellitus with peripheral neuropathy, diastolic congestive heart failure,  nonischemic cardiomyopathy, PAF with ICD with chronic Coumadin, peripheral vascular disease. [name] [name] lives with his wife used a wheelchair as well as bilateral prosthesis provided by [employer] prosthetics prior to admission. 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 He was discharged to home from latest rehabilitation admission in June 2015. Presented 01/03/2015 with developing wounds distal end of tibial osteotomy of left below-knee amputation site. Underwent revision of left BKA 01/03/2015 per Dr.Xu. Hospital course pain management. Patient remains on aspirin and Plavix therapy as prior to admission along with chronic Coumadin resumed for PAF. [name]  Patient transferred to CIR on 01/08/2015 .   Patient currently requires min with mobility secondary to muscle weakness and decreased cardiorespiratoy endurance.  Prior to hospitalization, patient was supervision with mobility and lived with Spouse, Daughter in a House home.  Home access is 2Stairs to enter, Ramped entrance.  Patient will benefit from skilled PT intervention to maximize safe functional mobility, minimize fall risk and decrease caregiver burden for planned discharge home with 24 hour supervision.  Anticipate patient will benefit from follow up [place] at discharge.  PT - End of Session Activity Tolerance: Tolerates 30+ min activity with multiple rests Endurance Deficit: Yes Endurance Deficit Description: Pt needing multiple rest breaks during session.  PT Assessment Rehab Potential (ACUTE/IP ONLY): Excellent PT Patient demonstrates impairments in the following area(s): Balance;Endurance;Motor;Safety;Skin Integrity PT Transfers Functional Problem(s): Bed Mobility;Bed to Chair;Car;Furniture PT Locomotion Functional Problem(s): Wheelchair Mobility PT Plan PT Intensity: Minimum of 1-2 x/day ,45 to 90 minutes PT Frequency: 5 out of 7 days PT Duration  Estimated Length of Stay: 5 days  PT Treatment/Interventions: Ambulation/gait training;Cognitive remediation/compensation;Discharge planning;Disease management/prevention;DME/adaptive equipment instruction;Functional mobility training;Pain management;Patient/family education;Psychosocial support;Skin care/wound management;Therapeutic Activities;Therapeutic Exercise;UE/LE Strength taining/ROM;Wheelchair propulsion/positioning PT Transfers Anticipated Outcome(s): S  PT Locomotion Anticipated Outcome(s): S w/c level PT Recommendation Follow Up Recommendations: Home health PT;24 hour supervision/assistance Patient destination: Home Equipment Recommended: To be determined  Skilled Therapeutic Intervention PT assessment and evaluation completed, see full details below.  Initiated squat pivot transfer with focus on increasing forward weight shift and WB through RLE, however pt relies heavily on UE use during session.  Initiated semi standing again with focus on forward weight shift with BUE placed on small bench to increase comfort with forward trunk lean.  Also worked on Personnel officer with use of RW and initiated curb step negotiation.  Requires mod to max A (+2A for chair follow) for tasks due to pts deconditioning and UE weakness.  Educated on ELOS, expected outcome, etc.  Pt verbalized understanding.  Pt left in room with all needs in reach.   PM session:  Skilled session focused on car transfer to simulated car height for safe DC, functional transfers, forward weight shift for increased WB through RLE, improving fit of R residual limb in socket, and performing lateral leans at bed level to assist with cleaning following urinary accident.  Performed w/c mobility throughout session at S level.  Discussed car height at home, pt stating he prefers getting into [place], however PT hesitant due to height.  Pt set up w/c and performed as he would at home, however requires mod A for safety.  Discussed getting  into car at this time to prevent wife from having to assist as well as increasing safety.  Pt needing to void, however spilled on pants while voiding, therefore assisted back to room and to EOB while pt performing several lateral leans to remove and clean.  Note that as he continued to fatigue, posterior lean increased and requires up to max A to assist.  Remainder of session focused on mini stands with focus on increasing forward trunk lean and WB through RLE, however pt states increased pain at bottom anterior portion of residual limb.  Upon removal of limb, noted some swelling and redness to area.  Had pt don 2, 3ply socks, however this was too tight, therefore donned 1, 3 ply and 2, 1 ply socks for improved fit.  Pt transferred back to w/c and assisted to room.  Left in w/c with all needs in reach and visitors present.    PT Evaluation Precautions/Restrictions Precautions Precautions: Fall Precaution Comments: hx of dementia Restrictions Weight Bearing Restrictions: Yes LLE Weight Bearing:  Non weight bearing General Chart Reviewed: No Family/Caregiver Present: No Vital SignsTherapy Vitals Temp: 98.3 F (36.8 C) Temp Source: Oral Pulse Rate: 80 Resp: 18 BP: (!) 107/58 mmHg Patient Position (if appropriate): Sitting Oxygen Therapy SpO2: 99 % O2 Device: Not Delivered Pain:  Pt with no c/o pain during session.    Home Living/Prior Functioning Home Living Available Help at Discharge: Family;Available 24 hours/day Type of Home: House Home Access: Stairs to enter;Ramped entrance Entrance Stairs-Number of Steps: 2 Entrance Stairs-Rails: Right Home Layout: One level Additional Comments: Pt was sponge bathing Prior to admit  Lives With: Spouse;Daughter Prior Function Level of Independence: Requires assistive device for independence;Needs assistance with ADLs  Able to Take Stairs?: Yes Driving: No Vocation: Retired Comments: Pt reports Mod I for ADLs however per note ~11 months ago,  wife reports pt requires assist with getting into bathroom and assist with bathing? not sure of accuracy of PLOF, hx of dementia?    Cognition Overall Cognitive Status: No family/caregiver present to determine baseline cognitive functioning Arousal/Alertness: Awake/alert Orientation Level: Oriented X4 Attention: Selective Selective Attention: Appears intact Memory: Impaired Memory Impairment: Decreased recall of new information Awareness: Appears intact Problem Solving: Impaired Problem Solving Impairment: Functional basic Safety/Judgment: Impaired Sensation Sensation Light Touch: Impaired Detail Light Touch Impaired Details: Impaired LLE;Impaired RLE Stereognosis: Not tested Hot/Cold: Not tested Proprioception: Appears Intact (LEs) Coordination Gross Motor Movements are Fluid and Coordinated: Yes Fine Motor Movements are Fluid and Coordinated: Yes Motor  Motor Motor: Other (comment) Motor - Skilled Clinical Observations: generalized weakness and pain  Mobility Bed Mobility Bed Mobility: Supine to Sit;Sit to Supine Supine to Sit: 5: Supervision Supine to Sit Details: Verbal cues for sequencing;Verbal cues for technique;Verbal cues for precautions/safety Sit to Supine: 5: Supervision Sit to Supine - Details: Verbal cues for sequencing;Verbal cues for technique;Verbal cues for precautions/safety Transfers Transfers: Yes Sit to Stand: 3: Mod assist;2: Max assist Sit to Stand Details: Verbal cues for sequencing;Verbal cues for technique;Verbal cues for precautions/safety;Manual facilitation for weight shifting Stand to Sit: 2: Max assist;3: Mod assist Stand to Sit Details (indicate cue type and reason): Visual cues/gestures for sequencing;Verbal cues for sequencing;Verbal cues for technique;Verbal cues for precautions/safety;Manual facilitation for weight shifting Lateral/Scoot Transfers: 4: Min assist Lateral/Scoot Transfer Details: Verbal cues for sequencing;Verbal cues for  technique;Verbal cues for precautions/safety Locomotion  Ambulation Ambulation: Yes Ambulation/Gait Assistance: 3: Mod assist;1: +2 Total assist Ambulation Distance (Feet): 10 Feet Assistive device: Rolling walker Gait Gait: Yes Gait Pattern: Impaired Gait Pattern: Step-to pattern;Trunk flexed;Narrow base of support Stairs / Additional Locomotion Stairs: Yes Stairs Assistance: 2: Max assist;1: +2 Total assist Stairs Assistance Details: Verbal cues for sequencing;Verbal cues for technique;Verbal cues for precautions/safety;Manual facilitation for weight shifting;Verbal cues for gait pattern;Verbal cues for safe use of DME/AE Stair Management Technique: No rails;Step to pattern;Backwards;With walker Number of Stairs: 1 Height of Stairs: 4 Wheelchair Mobility Wheelchair Mobility: Yes Wheelchair Assistance: 5: Investment banker, operational Details: Verbal cues for sequencing;Verbal cues for technique;Verbal cues for Information systems manager: Both upper extremities Wheelchair Parts Management: Needs assistance Distance: 100  Trunk/Postural Assessment  Cervical Assessment Cervical Assessment: Within Functional Limits Thoracic Assessment Thoracic Assessment: Within Functional Limits Lumbar Assessment Lumbar Assessment: Exceptions to [facility] Lumbar Strength Overall Lumbar Strength Comments: posterior pelvic tilt Postural Control Postural Control:  (needs extra time)  Balance Balance Balance Assessed: Yes Static Sitting Balance Static Sitting - Balance Support: No upper extremity supported;Feet unsupported Static Sitting - Level of Assistance: 5: Stand by assistance Dynamic Sitting Balance Dynamic  Sitting - Balance Support: During functional activity;Feet unsupported;No upper extremity supported Dynamic Sitting - Level of Assistance: 5: Stand by assistance Extremity Assessment  RUE Assessment RUE Assessment: Within Functional Limits LUE Assessment LUE  Assessment: Within Functional Limits RLE Assessment RLE Assessment: Within Functional Limits (hip and knee motions WFL) LLE Assessment LLE Assessment: Within Functional Limits (hip and knee motions grossly WFL)  FIM:  FIM - Bed/Chair Transfer Bed/Chair Transfer Assistive Devices: Arm rests Bed/Chair Transfer: 5: Supine > Sit: Supervision (verbal cues/safety issues);5: Sit > Supine: Supervision (verbal cues/safety issues);4: Bed > Chair or W/C: Min A (steadying Pt. > 75%);4: Chair or W/C > Bed: Min A (steadying Pt. > 75%) FIM - Locomotion: Wheelchair Distance: 100 Locomotion: Wheelchair: 2: Travels 50 - 149 ft with supervision, cueing or coaxing FIM - Locomotion: Ambulation Locomotion: Ambulation Assistive Devices: Administrator Ambulation/Gait Assistance: 3: Mod assist;1: +2 Total assist Locomotion: Ambulation: 1: Two helpers FIM - Locomotion: Stairs Locomotion: Scientist, physiological: Insurance account manager - 1 Locomotion: Stairs: 1: Two helpers   Refer to [employer] for Long Term Goals  Recommendations for other services: None  Discharge Criteria: Patient will be discharged from PT if patient refuses treatment 3 consecutive times without medical reason, if treatment goals not met, if there is a change in medical status, if patient makes no progress towards goals or if patient is discharged from hospital.  The above assessment, treatment plan, treatment alternatives and goals were discussed and mutually agreed upon: by patient  Denice Bors 01/09/2015, 4:22 PM

## 2015-01-09 NOTE — Progress Notes (Signed)
Patient information reviewed and entered into eRehab system by Trong Gosling, RN, CRRN, PPS Coordinator.  Information including medical coding and functional independence measure will be reviewed and updated through discharge.     Per nursing patient was given "Data Collection Information Summary for Patients in Inpatient Rehabilitation Facilities with attached "Privacy Act Statement-Health Care Records" upon admission.  

## 2015-01-09 NOTE — Progress Notes (Signed)
ANTICOAGULATION CONSULT NOTE - Follow Up Consult  Pharmacy Consult for coumadin Indication: atrial fibrillation  No Known Allergies  Patient Measurements: Height: 5\' 9"  (175.3 cm) Weight: 187 lb 6.3 oz (85 kg) IBW/kg (Calculated) : 70.7 Heparin Dosing Weight:   Vital Signs: Temp: 98.5 F (36.9 C) (05/17 0550) Temp Source: Oral (05/17 0550) BP: 120/61 mmHg (05/17 0550) Pulse Rate: 79 (05/17 0550)  Labs:  Recent Labs  01/08/15 0609 01/09/15 0540  HGB  --  11.1*  HCT  --  33.4*  PLT  --  237  LABPROT 19.9* 21.8*  INR 1.67* 1.88*  CREATININE  --  1.41*    Estimated Creatinine Clearance: 51.9 mL/min (by C-G formula based on Cr of 1.41).   Medications:  Scheduled:  . aspirin  81 mg Oral Daily  . atorvastatin  10 mg Oral Daily  . carvedilol  12.5 mg Oral BID WC  . cholecalciferol  1,000 Units Oral Daily  . clopidogrel  75 mg Oral Q breakfast  . furosemide  40 mg Oral BID  . insulin aspart  0-15 Units Subcutaneous TID WC  . insulin detemir  38 Units Subcutaneous QHS  . iron polysaccharides  150 mg Oral Daily  . multivitamin with minerals  1 tablet Oral Daily  . pantoprazole  80 mg Oral Q1200  . potassium chloride SA  20 mEq Oral BID  . pregabalin  75 mg Oral BID  . senna-docusate  1 tablet Oral BID  . spironolactone  12.5 mg Oral QHS  . vitamin E  1,000 Units Oral Daily  . warfarin  10 mg Oral Once per day on Mon Thu Sat  . warfarin  15 mg Oral Once per day on Sun Tue Wed Fri  . Warfarin - Pharmacist Dosing Inpatient   Does not apply q1800   Infusions:    Assessment: 69 yo male with hx of afib is currently on subtherapeutic coumadin. INR today is up to 1.88 from 1.67. Goal of Therapy:  INR 2-3 Monitor platelets by anticoagulation protocol: Yes   Plan:  - Cont Warf 15 mg daily EXCEPT for 10 mg on Mon/Thur/Sat  - Daily PT/INR  Bayley Yarborough, Tsz-Yin 01/09/2015,8:17 AM

## 2015-01-10 ENCOUNTER — Inpatient Hospital Stay (HOSPITAL_COMMUNITY): Payer: Medicare Other | Admitting: Occupational Therapy

## 2015-01-10 ENCOUNTER — Inpatient Hospital Stay (HOSPITAL_COMMUNITY): Payer: Medicare Other | Admitting: Rehabilitation

## 2015-01-10 DIAGNOSIS — E1140 Type 2 diabetes mellitus with diabetic neuropathy, unspecified: Secondary | ICD-10-CM | POA: Diagnosis present

## 2015-01-10 LAB — GLUCOSE, CAPILLARY
GLUCOSE-CAPILLARY: 235 mg/dL — AB (ref 65–99)
GLUCOSE-CAPILLARY: 191 mg/dL — AB (ref 65–99)
Glucose-Capillary: 180 mg/dL — ABNORMAL HIGH (ref 65–99)
Glucose-Capillary: 245 mg/dL — ABNORMAL HIGH (ref 65–99)
Glucose-Capillary: 257 mg/dL — ABNORMAL HIGH (ref 65–99)

## 2015-01-10 LAB — PROTIME-INR
INR: 2.21 — AB (ref 0.00–1.49)
Prothrombin Time: 24.7 seconds — ABNORMAL HIGH (ref 11.6–15.2)

## 2015-01-10 MED ORDER — INSULIN DETEMIR 100 UNIT/ML ~~LOC~~ SOLN
45.0000 [IU] | Freq: Every day | SUBCUTANEOUS | Status: DC
  Administered 2015-01-10: 45 [IU] via SUBCUTANEOUS
  Filled 2015-01-10: qty 0.45

## 2015-01-10 NOTE — Progress Notes (Signed)
ANTICOAGULATION CONSULT NOTE - Follow Up Consult  Pharmacy Consult for coumadin Indication: atrial fibrillation  No Known Allergies  Patient Measurements: Height: 5\' 9"  (175.3 cm) Weight: 187 lb 6.3 oz (85 kg) IBW/kg (Calculated) : 70.7 Heparin Dosing Weight:   Vital Signs: Temp: 97.9 F (36.6 C) (05/18 0602) Temp Source: Oral (05/18 0602) BP: 110/65 mmHg (05/18 0602) Pulse Rate: 83 (05/18 0602)  Labs:  Recent Labs  01/08/15 0609 01/09/15 0540 01/10/15 0636  HGB  --  11.1*  --   HCT  --  33.4*  --   PLT  --  237  --   LABPROT 19.9* 21.8* 24.7*  INR 1.67* 1.88* 2.21*  CREATININE  --  1.41*  --     Estimated Creatinine Clearance: 51.9 mL/min (by C-G formula based on Cr of 1.41).   Medications:  Scheduled:  . aspirin  81 mg Oral Daily  . atorvastatin  10 mg Oral Daily  . carvedilol  12.5 mg Oral BID WC  . cholecalciferol  1,000 Units Oral Daily  . clopidogrel  75 mg Oral Q breakfast  . furosemide  40 mg Oral BID  . insulin aspart  0-15 Units Subcutaneous TID WC  . insulin detemir  45 Units Subcutaneous QHS  . iron polysaccharides  150 mg Oral Daily  . multivitamin with minerals  1 tablet Oral Daily  . pantoprazole  80 mg Oral Q1200  . potassium chloride SA  20 mEq Oral BID  . pregabalin  75 mg Oral BID  . senna-docusate  1 tablet Oral BID  . spironolactone  12.5 mg Oral QHS  . vitamin E  1,000 Units Oral Daily  . warfarin  10 mg Oral Once per day on Mon Thu Sat  . warfarin  15 mg Oral Once per day on Sun Tue Wed Fri  . Warfarin - Pharmacist Dosing Inpatient   Does not apply q1800   Infusions:    Assessment: 69 yo male with afib is currently on therapeutic coumadin.  INR today is 2.21 Goal of Therapy:  INR 2-3 Monitor platelets by anticoagulation protocol: Yes   Plan:  - Cont Warf 15 mg daily EXCEPT for 10 mg on Mon/Thur/Sat  - Daily PT/INR  Donald Jacque, Tsz-Yin 01/10/2015,8:14 AM

## 2015-01-10 NOTE — Progress Notes (Signed)
Inpatient Diabetes Program Recommendations  AACE/ADA: New Consensus Statement on Inpatient Glycemic Control  Target Ranges:  Prepandial:   less than 140 mg/dL      Peak postprandial:   less than 180 mg/dL (1-2 hours)      Critically ill patients:  140 - 180 mg/dL  Pager:  [phone]    Reason for Visit: Elevated glucose in 200s and 300s  Results for TEVYN, CODD (MRN [id number]) as of 01/10/2015 08:08  Ref. Range 01/09/2015 06:52 01/09/2015 11:48 01/09/2015 16:31 01/09/2015 20:52 01/10/2015 06:59  Glucose-Capillary Latest Ref Range: 65-99 mg/dL 242 (H) 234 (H) 238 (H) 331 (H) 235 (H)    Inpatient Diabetes Program Recommendations Insulin - Basal: Consider changing to home dose:  Levemir 25 units in am and 35 units qhs  Courtney Heys PhD, RN, BC-ADM Diabetes Coordinator  Office:  [phone] Team Pager:  [phone]

## 2015-01-10 NOTE — Progress Notes (Signed)
Physical Therapy Session Note  Patient Details  Name: [name] [name] MRN: [id number] Date of Birth: 06-06-1946  Today's Date: 01/10/2015 PT Individual Time: 0830-0930 PT Individual Time Calculation (min): 60 min   Short Term Goals: Week 1:  PT Short Term Goal 1 (Week 1): =LTG's due to ELOS  Skilled Therapeutic Interventions/Progress Updates:   Pt received sitting in w/c in room, agreeable to therapy session.  Skilled session focused on w/c propulsion in controlled environment to/from therapy gym at S level for overall strengthening and activity tolerance, supine therex for BLE as follows; supine B SLR x 10 reps, supine quad sets x 10 reps BLE, SL hip ext BLE x 10 reps, SL B LE hip abd x 10 reps and supine hip flex stretch with each LE off EOM.  Note that hip flexors very tight and unable to ext much past neutral.  Also continue to address forward weight shift with pelvis in anterior tilt for optimal postural control with tasks such as reaching forward for target, lateral scooting to carry over to transfers and sustained activation of RLE during forward weight shift.  Pt tolerated well with no pain in RLE during WB tasks.  Pt performed all transfers at min/guard to close S level during session with increased cues for carryover of forward weight shift.  Propelled back to room and left in w/c with all needs in reach.    Therapy Documentation Precautions:  Precautions Precautions: Fall Precaution Comments: hx of dementia Restrictions Weight Bearing Restrictions: Yes LLE Weight Bearing: Non weight bearing   Vital Signs: Therapy Vitals Temp: 97.9 F (36.6 C) Temp Source: Oral Pulse Rate: 86 Resp: 18 BP: 117/67 mmHg Patient Position (if appropriate): Lying Oxygen Therapy SpO2: 100 % O2 Device: Not Delivered Pain: Pt with 5/10 pain in L residual limb, however declined pain medication.   See FIM for current functional status  Therapy/Group: Individual Therapy  Denice Bors 01/10/2015, 8:54 AM

## 2015-01-10 NOTE — Patient Care Conference (Addendum)
Inpatient RehabilitationTeam Conference and Plan of Care Update Date: 01/10/2015   Time: 11:15 AM    Patient Name: [place] Record Number: [id number]  Date of Birth: Jun 12, 1946 Sex: Male         Room/Bed: 4M03C/4M03C-01 Payor Info: Payor: Marine scientist / Plan: [employer] MEDICARE / Product Type: *No Product type* /    Admitting Diagnosis: REVISIONS L BKA WITH PROSTHESIS  Admit Date/Time:  01/08/2015  1:23 PM Admission Comments: No comment available   Primary Diagnosis:  Amputation of right lower extremity below knee Principal Problem: Amputation of right lower extremity below knee  Patient Active Problem List   Diagnosis Date Noted  . Diabetes mellitus with neuropathy 01/10/2015  . Amputation of right lower extremity below knee 01/08/2015  . Below knee amputation status 01/03/2015  . Atrial fibrillation [I48.91] 11/01/2014  . Encounter for therapeutic drug monitoring 11/01/2014  . S/P bilateral BKA (below knee amputation) 02/02/2014  . Osteomyelitis of right foot 01/30/2014  . 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 01/04/2014  . Osteomyelitis of toe of right foot 01/04/2014  . S/P Lt BKA 11/09/13 11/14/2013  . Acute blood loss anemia 11/11/2013  . Type II or unspecified type diabetes mellitus 11/09/2013  . Lower limb amputation, great toe 10/11/2013  . Lower limb amputation, other toe(s) 10/11/2013  . Chronic osteomyelitis of toe of left foot 10/04/2013  . Foot osteomyelitis, left 10/04/2013  . Uncontrolled pain, Lt toe 09/21/2013  . Gangrenous toe, Lt toe 09/21/2013  . PAD (peripheral artery disease) 09/13/2013  . Diabetes mellitus 09/13/2013  . Hyperlipidemia 09/13/2013  . PAF (paroxysmal atrial fibrillation) 09/13/2013  . Critical lower limb ischemia- s/p Rt anterior tibial PTA 12/29/13 in preparation for Rt BKA 08/30/2013  . 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 HTN (hypertension) 04/08/2011  . NICM- EF 20-25% echo 6/14  09/29/2008  . LBBB 09/29/2008  . SYSTOLIC HEART FAILURE, CHRONIC 09/29/2008    Expected Discharge Date: Expected Discharge Date: 01/13/15  Team Members Present: Physician leading conference: Dr. Alysia Penna Social Worker Present: Alfonse Alpers, LCSW Nurse Present: Heather Roberts, RN PT Present: [name], PT;Emily Parcell, PT;Rodney Lajuana Matte, PT OT Present: Benay Pillow, OT SLP Present: Windell Moulding, SLP PPS Coordinator present : Daiva Nakayama, RN, CRRN     Current Status/Progress Goal Weekly Team Focus  Medical   poor cognition, unable to ambulate  home d/c at sup level  family training   Bowel/Bladder   continent of bowel and bladder.   Remain continent of bowel and bladder  offer use of bsc or urinal frequently.    Swallow/Nutrition/ Hydration             ADL's   SBA - steady assist for EOB dynamic sitting balance at EOB, Bathing and dressing overall Set up - min guard, ,min A lateral scoot transfer to wheelchair with R prosthesis  supervision  overall  pt/family edu, balance, transfer training   Mobility   S to min/guard level for all aspects of mobility  S overall  pt/family education, transfers, balance, LE strengthening   Communication             Safety/Cognition/ Behavioral Observations            Pain   Pt denies pain  Manage pain less than 3  Assess and treat pain frequently.    Skin  Rehab Goals Patient on target to meet rehab goals: Yes Rehab Goals Revised: none *See Care Plan and progress notes for long and short-term goals.  Barriers to Discharge: unable to ambulate with single prosthesis, some pain redness on right    Possible Resolutions to Barriers:  train at [facility] level.CPO eval    Discharge Planning/Teaching Needs:  Pt to return home where his wife will provide 24/7 supervision.  Therapists would like to do family education with pt's wife either thursday or friday this week prior to pt's d/c.   Team Discussion:  Pt went to surgery  for a revision for skin breakdown of her left BKA.  Dr. Letta Pate was concerned about pt's cognition and how close he is to baseline.  Pt is participating in therapies.  He will be at w/c level at home.  PT is concerned if pt's right prosthesis is working properly, so [employer] to come check.  Pt's biggest issue with therapies is trying to get him to lean forward to set up for transfers.  RN is checking to see what pt's dressing changes will be at home.  Pt has supervision level goals and is approaching min guard now.    Revisions to Treatment Plan:  none   Continued Need for Acute Rehabilitation Level of Care: The patient requires daily medical management by a physician with specialized training in physical medicine and rehabilitation for the following conditions: Daily direction of a multidisciplinary physical rehabilitation program to ensure safe treatment while eliciting the highest outcome that is of practical value to the patient.: Yes Daily medical management of patient stability for increased activity during participation in an intensive rehabilitation regime.: Yes Daily analysis of laboratory values and/or radiology reports with any subsequent need for medication adjustment of medical intervention for : Other  Jobe Mutch, Silvestre Mesi 01/11/2015, 9:16 AM

## 2015-01-10 NOTE — Progress Notes (Signed)
Physical Therapy Session Note  Patient Details  Name: [name] [name] MRN: [id number] Date of Birth: April 09, 1946  Today's Date: 01/10/2015 PT Individual Time: 1430-1600 PT Individual Time Calculation (min): 90 min   Short Term Goals: Week 1:  PT Short Term Goal 1 (Week 1): =LTG's due to ELOS  Skilled Therapeutic Interventions/Progress Updates:   Pt received sitting in w/c agreeable to therapy session.  Skilled session focused on w/c propulsion to/from therapy gym with BUEs for increased activity tolerance and generalized strengthening, functional transfers with increased focused on technique, w/c set up and forward weight shift, car transfer to simulated home car height, and supine hip flexor stretch with alternating LE off EOM x 2-3 mins each.  Performed w/c<>mat x 4 reps at S level with continued focus on w/c set up, removal of parts, increased forward weight shift and placement of R prosthesis for increased WB through LE.  Performed car transfer to simulated home car height at min A level due to space between car and w/c for safety.  He continues to remain with trunk in straight alignment when transferring, preventing him from fully elevating buttocks.  Performed w/c<>bedside commode transfer with nurse tech in training to further increase pts safety with transfers.  Performed at min A level with steadying need for safety of w/c.  Ended in therapy gym with transfer to/from mat as above with B LE hip flex stretch in supine with mild over pressure intermittently as well as R SL L hip flex stretch as above.  Pt propelled back to room and was left in w/c with all needs in reach.    Note that [employer] was called by PT to assess pts socket tomorrow on RLE to ensure proper fit.    Therapy Documentation Precautions:  Precautions Precautions: Fall Precaution Comments: hx of dementia Restrictions Weight Bearing Restrictions: Yes LLE Weight Bearing: Non weight bearing   Pain: Pt with c/o pain  during nustep ta   Locomotion : Wheelchair Mobility Distance: 150   See FIM for current functional status  Therapy/Group: Individual Therapy  Denice Bors 01/10/2015, 4:27 PM

## 2015-01-10 NOTE — Progress Notes (Signed)
Social Work Patient ID: [name] [name], male   DOB: 02-Nov-1945, 69 y.o.   MRN: [id number]   CSW met with pt and his wife to update them on team conference discussion and targeted d/c date of 01-13-15.  Pt was pleased with this date, but wife did not feel that was enough therapy and that pt has balance problems and needs to be able to do 2 steps, his w/c won't fit in the bathroom, showering is difficult, and bed is lower, but has nothing for him to grab onto to get himself up.  She is disabled herself and she cannot provide physical assistance.  CSW explained he has supervision level goals and suggested that she come in tomorrow for therapies so she can see pt's progress and talk with therapists about her concerns.  CSW shared the concerns with Linna Hoff, [place], and with therapy team.  They will address in family education.  CSW will f/u with team and wife/pt after family education tomorrow and assist them as appropriate.

## 2015-01-10 NOTE — Progress Notes (Signed)
Occupational Therapy Session Note  Patient Details  Name: [name] [name] MRN: [id number] Date of Birth: Oct 30, 1945  Today's Date: 01/10/2015 OT Individual Time: 6213-0865 OT Individual Time Calculation (min): 75 min    Short Term Goals: Week 1:  OT Short Term Goal 1 (Week 1): STG=LTG  Skilled Therapeutic Interventions/Progress Updates:  Upon entering the room, pt supine in room with 6/0 c/o pain in L LE. Pt performed supine >sit with close supervision and min verbal cues for technique. Pt requiring min verbal cues to come closer to EOB and forward leaning secondary to pt with posterior lean. Pt engaged in bathing and dressing seated on EOB as pt declined shower this session. Pt requiring mod cues during session for anterior weight shift to maintain balance during functional tasks. Pt utilizing lateral leans for LB clothing management with steady assist. Pt able to don R prosthesis with steady assist. Lateral scoot pivot transfer into wheelchair from bed with supervision and therapist holding chair for safety. Pt seated in wheelchair at sink for grooming tasks and then seated with breakfast tray in front of him. Call bell and all needed items within reach upon exiting the room.   Therapy Documentation Precautions:  Precautions Precautions: Fall Precaution Comments: hx of dementia Restrictions Weight Bearing Restrictions: Yes LLE Weight Bearing: Non weight bearing Vital Signs: Therapy Vitals Pulse Rate: 86 BP: 117/67 mmHg Pain: Pain Assessment Pain Assessment: 0-10 Pain Score: 2  Pain Location: Leg Pain Orientation: Left Pain Descriptors / Indicators: Aching Pain Onset: On-going Patients Stated Pain Goal: 4 Pain Intervention(s): Medication (See eMAR) ADL: ADL ADL Comments: see FIM  See FIM for current functional status  Therapy/Group: Individual Therapy  [name] 01/10/2015, 11:20 AM

## 2015-01-10 NOTE — Progress Notes (Signed)
69 y.o. right handed male with history of diabetes mellitus with peripheral neuropathy, diastolic congestive heart failure, nonischemic cardiomyopathy, PAF with ICD with chronic Coumadin, peripheral vascular disease. [name] [name] lives with his wife used a wheelchair as well as bilateral prosthesis provided by [employer] prosthetics prior to admission. 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 He was discharged to home from latest rehabilitation admission in June 2015. Presented 01/03/2015 with developing wounds distal end of tibial osteotomy of left below-knee amputation site. Underwent revision of left BKA 01/03/2015 per Dr.Xu. Hospital course pain management. Patient remains on aspirin and Plavix therapy as prior to admission along with chronic Coumadin resumed for PAF  Subjective/Complaints: Slept well  States he had a sore that kept bleeding related to Left prosthetic use, no issues on RIght side Review of Systems - Negative except constipation and left stump pain Objective: Vital Signs: Blood pressure 110/65, pulse 83, temperature 97.9 F (36.6 C), temperature source Oral, resp. rate 18, height $RemoveBe'5\' 9"'BsvifLhkw$  (1.753 m), weight 85 kg (187 lb 6.3 oz), SpO2 100 %. No results found. Results for orders placed or performed during the hospital encounter of 01/08/15 (from the past 72 hour(s))  Glucose, capillary     Status: Abnormal   Collection Time: 01/08/15  4:27 PM  Result Value Ref Range   Glucose-Capillary 219 (H) 65 - 99 mg/dL   Comment 1 Notify RN   Glucose, capillary     Status: Abnormal   Collection Time: 01/08/15  9:12 PM  Result Value Ref Range   Glucose-Capillary 281 (H) 65 - 99 mg/dL  CBC WITH DIFFERENTIAL     Status: Abnormal   Collection Time: 01/09/15  5:40 AM  Result Value Ref Range   WBC 5.7 4.0 - 10.5 K/uL   RBC 4.54 4.22 - 5.81 MIL/uL   Hemoglobin 11.1 (L) 13.0 - 17.0 g/dL   HCT 33.4 (L) 39.0  - 52.0 %   MCV 73.6 (L) 78.0 - 100.0 fL   MCH 24.4 (L) 26.0 - 34.0 pg   MCHC 33.2 30.0 - 36.0 g/dL   RDW 15.7 (H) 11.5 - 15.5 %   Platelets 237 150 - 400 K/uL   Neutrophils Relative % 72 43 - 77 %   Neutro Abs 4.1 1.7 - 7.7 K/uL   Lymphocytes Relative 15 12 - 46 %   Lymphs Abs 0.9 0.7 - 4.0 K/uL   Monocytes Relative 7 3 - 12 %   Monocytes Absolute 0.4 0.1 - 1.0 K/uL   Eosinophils Relative 6 (H) 0 - 5 %   Eosinophils Absolute 0.3 0.0 - 0.7 K/uL   Basophils Relative 0 0 - 1 %   Basophils Absolute 0.0 0.0 - 0.1 K/uL  Comprehensive metabolic panel     Status: Abnormal   Collection Time: 01/09/15  5:40 AM  Result Value Ref Range   Sodium 136 135 - 145 mmol/L   Potassium 4.2 3.5 - 5.1 mmol/L   Chloride 100 (L) 101 - 111 mmol/L   CO2 26 22 - 32 mmol/L   Glucose, Bld 260 (H) 65 - 99 mg/dL   BUN 22 (H) 6 - 20 mg/dL   Creatinine, Ser 1.41 (H) 0.61 - 1.24 mg/dL   Calcium 8.7 (L) 8.9 - 10.3 mg/dL   Total Protein 6.6 6.5 - 8.1 g/dL   Albumin 2.6 (L) 3.5 - 5.0 g/dL   AST 18 15 - 41 U/L   ALT  16 (L) 17 - 63 U/L   Alkaline Phosphatase 73 38 - 126 U/L   Total Bilirubin 0.3 0.3 - 1.2 mg/dL   GFR calc non Af Amer 49 (L) >60 mL/min   GFR calc Af Amer 56 (L) >60 mL/min    Comment: (NOTE) The eGFR has been calculated using the CKD EPI equation. This calculation has not been validated in all clinical situations. eGFR's persistently <60 mL/min signify possible Chronic Kidney Disease.    Anion gap 10 5 - 15  Protime-INR     Status: Abnormal   Collection Time: 01/09/15  5:40 AM  Result Value Ref Range   Prothrombin Time 21.8 (H) 11.6 - 15.2 seconds   INR 1.88 (H) 0.00 - 1.49  Glucose, capillary     Status: Abnormal   Collection Time: 01/09/15  6:52 AM  Result Value Ref Range   Glucose-Capillary 242 (H) 65 - 99 mg/dL  Glucose, capillary     Status: Abnormal   Collection Time: 01/09/15 11:48 AM  Result Value Ref Range   Glucose-Capillary 234 (H) 65 - 99 mg/dL  Glucose, capillary      Status: Abnormal   Collection Time: 01/09/15  4:31 PM  Result Value Ref Range   Glucose-Capillary 238 (H) 65 - 99 mg/dL  Glucose, capillary     Status: Abnormal   Collection Time: 01/09/15  8:52 PM  Result Value Ref Range   Glucose-Capillary 331 (H) 65 - 99 mg/dL  Glucose, capillary     Status: Abnormal   Collection Time: 01/10/15  6:59 AM  Result Value Ref Range   Glucose-Capillary 235 (H) 65 - 99 mg/dL     HEENT: normal Cardio: RRR Resp: CTA B/L GI: BS positive and NT, ND Extremity:  Edema left stump Skin:   Wound Coban wrap on left BKA Neuro: Alert/Oriented and Abnormal Motor 5/5 in BUE, 5/5 in B HF, right KE, 4- LKE Musc/Skel:  Swelling left BKA GEN NAD     Assessment/Plan: 1. Functional deficits secondary to Left BKA revision which require 3+ hours per day of interdisciplinary therapy in a comprehensive inpatient rehab setting. Physiatrist is providing close team supervision and 24 hour management of active medical problems listed below. Physiatrist and rehab team continue to assess barriers to discharge/monitor patient progress toward functional and medical goals. Team conference today please see physician documentation under team conference tab, met with team face-to-face to discuss problems,progress, and goals. Formulized individual treatment plan based on medical history, underlying problem and comorbidities. FIM: FIM - Bathing Bathing Steps Patient Completed: Chest, Right Arm, Left Arm, Abdomen, Front perineal area, Buttocks, Right upper leg, Left upper leg Bathing: 4: Steadying assist  FIM - Upper Body Dressing/Undressing Upper body dressing/undressing steps patient completed: Thread/unthread right sleeve of pullover shirt/dresss, Thread/unthread left sleeve of pullover shirt/dress, Put head through opening of pull over shirt/dress, Pull shirt over trunk Upper body dressing/undressing: 5: Set-up assist to: Obtain clothing/put away FIM - Lower Body  Dressing/Undressing Lower body dressing/undressing steps patient completed: Thread/unthread right underwear leg, Thread/unthread left underwear leg, Pull underwear up/down, Thread/unthread right pants leg, Thread/unthread left pants leg, Pull pants up/down, Don/Doff right shoe Lower body dressing/undressing: 4: Min-Patient completed 75 plus % of tasks  FIM - Musician Devices: Grab bar or rail for support Toileting: 1: Total-Patient completed zero steps, helper did all 3  FIM - Radio producer Devices: Recruitment consultant Transfers: 1-Two helpers  FIM - Control and instrumentation engineer Devices: Arm rests  Bed/Chair Transfer: 5: Supine > Sit: Supervision (verbal cues/safety issues), 5: Sit > Supine: Supervision (verbal cues/safety issues), 4: Bed > Chair or W/C: Min A (steadying Pt. > 75%), 4: Chair or W/C > Bed: Min A (steadying Pt. > 75%)  FIM - Locomotion: Wheelchair Distance: 100 Locomotion: Wheelchair: 2: Travels 50 - 149 ft with supervision, cueing or coaxing FIM - Locomotion: Ambulation Locomotion: Ambulation Assistive Devices: Administrator Ambulation/Gait Assistance: 3: Mod assist, 1: +2 Total assist Locomotion: Ambulation: 1: Two helpers  Comprehension Comprehension Mode: Auditory Comprehension: 5-Follows basic conversation/direction: With no assist  Expression Expression Mode: Verbal Expression: 5-Expresses basic needs/ideas: With no assist  Social Interaction Social Interaction: 5-Interacts appropriately 90% of the time - Needs monitoring or encouragement for participation or interaction.  Problem Solving Problem Solving: 3-Solves basic 50 - 74% of the time/requires cueing 25 - 49% of the time  Memory Memory: 4-Recognizes or recalls 75 - 89% of the time/requires cueing 10 - 24% of the time   Medical Problem List and Plan: 1. Functional deficits secondary to left BKA revision, history of right BKA  with bilateral prosthesis 2. DVT Prophylaxis/Anticoagulation: Chronic Coumadin therapy for PAF, ASA, Plavix 3. Pain Management: Lyrica 75 mg twice a day, Oxycodone and Robaxin as needed. Monitor with increased mobility 4. Acute blood loss anemia. Follow-up CBC/continue Niferex 5. Neuropsych: This patient is capable of making decisions on his own behalf. 6. Skin/Wound Care: Routine skin checks 7. Fluids/Electrolytes/Nutrition: Routine I nose with follow-up chemistries 8. Diabetes mellitus with peripheral neuropathy. Check blood sugars before meals and at bedtime. Levemir 38 units daily.am CBG elevated, will adjust, home dose is 25 Units in am and 35 Units in pm 9. Nonischemic cardiomyopathy/PAF/ICD. Coreg 12.5 mg twice a day, , Aldactone 12.5 mg daily at bedtime. Monitor with increased mobility 10. Diastolic congestive heart failure. Lasix 40 mg twice daily. Monitor for any signs of fluid overload 11. Hyperlipidemia. Lipitor 12. GERD. Protonix LOS (Days) 2 A FACE TO FACE EVALUATION WAS PERFORMED  [name] [name] 01/10/2015, 7:14 AM

## 2015-01-10 NOTE — Progress Notes (Signed)
[facility] Individual Statement of Services  Patient Name:  [name] [name]  Date:  01/10/2015  Welcome to the [facility].  Our goal is to provide you with an individualized program based on your diagnosis and situation, designed to meet your specific needs.  With this comprehensive rehabilitation program, you will be expected to participate in at least 3 hours of rehabilitation therapies Monday-Friday, with modified therapy programming on the weekends.  Your rehabilitation program will include the following services:  Physical Therapy (PT), Occupational Therapy (OT), 24 hour per day rehabilitation nursing, Case Management (Social Worker), Rehabilitation Medicine, Nutrition Services and Pharmacy Services  Weekly team conferences will be held on Wednesdays to discuss your progress.  Your Social Worker will talk with you frequently to get your input and to update you on team discussions.  Team conferences with you and your family in attendance may also be held.  Expected length of stay:  5 days  Overall anticipated outcome:  Supervision  Depending on your progress and recovery, your program may change. Your Social Worker will coordinate services and will keep you informed of any changes. Your Social Worker's name and contact numbers are listed  below.  The following services may also be recommended but are not provided by the [facility] will be made to provide these services after discharge if needed.  Arrangements include referral to agencies that provide these services.  Your insurance has been verified to be:  [employer] Your primary doctor is:  Dr. Josetta Huddle  Pertinent information will be shared with your doctor and your insurance company.  Social Worker:  Alfonse Alpers, LCSW  [phone]  or ([phone]  Information discussed with and copy given to patient by: Trey Sailors, 01/10/2015, 2:06 PM

## 2015-01-11 ENCOUNTER — Inpatient Hospital Stay (HOSPITAL_COMMUNITY): Payer: Medicare Other | Admitting: Occupational Therapy

## 2015-01-11 ENCOUNTER — Ambulatory Visit (HOSPITAL_COMMUNITY): Payer: Medicare Other | Admitting: Rehabilitation

## 2015-01-11 ENCOUNTER — Encounter (HOSPITAL_COMMUNITY): Payer: Medicare Other | Admitting: Occupational Therapy

## 2015-01-11 ENCOUNTER — Inpatient Hospital Stay (HOSPITAL_COMMUNITY): Payer: Medicare Other

## 2015-01-11 DIAGNOSIS — E1140 Type 2 diabetes mellitus with diabetic neuropathy, unspecified: Secondary | ICD-10-CM

## 2015-01-11 DIAGNOSIS — Z95810 Presence of automatic (implantable) cardiac defibrillator: Secondary | ICD-10-CM

## 2015-01-11 DIAGNOSIS — I739 Peripheral vascular disease, unspecified: Secondary | ICD-10-CM

## 2015-01-11 LAB — URINALYSIS, ROUTINE W REFLEX MICROSCOPIC
BILIRUBIN URINE: NEGATIVE
GLUCOSE, UA: NEGATIVE mg/dL
Ketones, ur: NEGATIVE mg/dL
Nitrite: NEGATIVE
PROTEIN: 100 mg/dL — AB
Specific Gravity, Urine: 1.013 (ref 1.005–1.030)
UROBILINOGEN UA: 0.2 mg/dL (ref 0.0–1.0)
pH: 8.5 — ABNORMAL HIGH (ref 5.0–8.0)

## 2015-01-11 LAB — GLUCOSE, CAPILLARY
GLUCOSE-CAPILLARY: 157 mg/dL — AB (ref 65–99)
Glucose-Capillary: 182 mg/dL — ABNORMAL HIGH (ref 65–99)
Glucose-Capillary: 227 mg/dL — ABNORMAL HIGH (ref 65–99)
Glucose-Capillary: 288 mg/dL — ABNORMAL HIGH (ref 65–99)

## 2015-01-11 LAB — URINE MICROSCOPIC-ADD ON

## 2015-01-11 LAB — PROTIME-INR
INR: 2.55 — ABNORMAL HIGH (ref 0.00–1.49)
Prothrombin Time: 27.6 seconds — ABNORMAL HIGH (ref 11.6–15.2)

## 2015-01-11 MED ORDER — INSULIN DETEMIR 100 UNIT/ML ~~LOC~~ SOLN
15.0000 [IU] | Freq: Every day | SUBCUTANEOUS | Status: DC
  Administered 2015-01-11: 15 [IU] via SUBCUTANEOUS
  Filled 2015-01-11 (×2): qty 0.15

## 2015-01-11 MED ORDER — INSULIN DETEMIR 100 UNIT/ML ~~LOC~~ SOLN
35.0000 [IU] | Freq: Every day | SUBCUTANEOUS | Status: DC
  Administered 2015-01-11 – 2015-01-12 (×2): 35 [IU] via SUBCUTANEOUS
  Filled 2015-01-11 (×3): qty 0.35

## 2015-01-11 NOTE — Progress Notes (Signed)
69 y.o. right handed male with history of diabetes mellitus with peripheral neuropathy, diastolic congestive heart failure, nonischemic cardiomyopathy, PAF with ICD with chronic Coumadin, peripheral vascular disease. Mr. Wageman lives with his wife used a wheelchair as well as bilateral prosthesis provided by [employer] prosthetics prior to admission. 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 He was discharged to home from latest rehabilitation admission in June 2015. Presented 01/03/2015 with developing wounds distal end of tibial osteotomy of left below-knee amputation site. Underwent revision of left BKA 01/03/2015 per Dr.Xu. Hospital course pain management. Patient remains on aspirin and Plavix therapy as prior to admission along with chronic Coumadin resumed for PAF  Subjective/Complaints: Scratch on back of head some bleeding Burning with urination Review of Systems - Negative except constipation and left stump pain Objective: Vital Signs: Blood pressure 100/72, pulse 79, temperature 97.7 F (36.5 C), temperature source Oral, resp. rate 18, height _0  (1.753 m), weight 84.2 kg (185 lb 10 oz), SpO2 100 %. No results found. Results for orders placed or performed during the hospital encounter of 01/08/15 (from the past 72 hour(s))  Glucose, capillary     Status: Abnormal   Collection Time: 01/08/15  4:27 PM  Result Value Ref Range   Glucose-Capillary 219 (H) 65 - 99 mg/dL   Comment 1 Notify RN   Glucose, capillary     Status: Abnormal   Collection Time: 01/08/15  9:12 PM  Result Value Ref Range   Glucose-Capillary 281 (H) 65 - 99 mg/dL  CBC WITH DIFFERENTIAL     Status: Abnormal   Collection Time: 01/09/15  5:40 AM  Result Value Ref Range   WBC 5.7 4.0 - 10.5 K/uL   RBC 4.54 4.22 - 5.81 MIL/uL   Hemoglobin 11.1 (L) 13.0 - 17.0 g/dL   HCT 33.4 (L) 39.0 - 52.0 %   MCV 73.6 (L) 78.0 - 100.0 fL   MCH 24.4 (L) 26.0 - 34.0 pg   MCHC 33.2 30.0 - 36.0 g/dL   RDW 15.7 (H) 11.5 - 15.5 %   Platelets 237 150 - 400 K/uL   Neutrophils Relative % 72 43 - 77 %   Neutro Abs 4.1 1.7 - 7.7 K/uL   Lymphocytes Relative 15 12 - 46 %   Lymphs Abs 0.9 0.7 - 4.0 K/uL   Monocytes Relative 7 3 - 12 %   Monocytes Absolute 0.4 0.1 - 1.0 K/uL   Eosinophils Relative 6 (H) 0 - 5 %   Eosinophils Absolute 0.3 0.0 - 0.7 K/uL   Basophils Relative 0 0 - 1 %   Basophils Absolute 0.0 0.0 - 0.1 K/uL  Comprehensive metabolic panel     Status: Abnormal   Collection Time: 01/09/15  5:40 AM  Result Value Ref Range   Sodium 136 135 - 145 mmol/L   Potassium 4.2 3.5 - 5.1 mmol/L   Chloride 100 (L) 101 - 111 mmol/L   CO2 26 22 - 32 mmol/L   Glucose, Bld 260 (H) 65 - 99 mg/dL   BUN 22 (H) 6 - 20 mg/dL   Creatinine, Ser 1.41 (H) 0.61 - 1.24 mg/dL   Calcium 8.7 (L) 8.9 - 10.3 mg/dL   Total Protein 6.6 6.5 - 8.1 g/dL   Albumin 2.6 (L) 3.5 - 5.0 g/dL   AST 18 15 - 41 U/L   ALT 16 (L) 17 - 63 U/L   Alkaline Phosphatase 73 38 -  126 U/L   Total Bilirubin 0.3 0.3 - 1.2 mg/dL   GFR calc non Af Amer 49 (L) >60 mL/min   GFR calc Af Amer 56 (L) >60 mL/min    Comment: (NOTE) The eGFR has been calculated using the CKD EPI equation. This calculation has not been validated in all clinical situations. eGFR's persistently <60 mL/min signify possible Chronic Kidney Disease.    Anion gap 10 5 - 15  Protime-INR     Status: Abnormal   Collection Time: 01/09/15  5:40 AM  Result Value Ref Range   Prothrombin Time 21.8 (H) 11.6 - 15.2 seconds   INR 1.88 (H) 0.00 - 1.49  Glucose, capillary     Status: Abnormal   Collection Time: 01/09/15  6:52 AM  Result Value Ref Range   Glucose-Capillary 242 (H) 65 - 99 mg/dL  Glucose, capillary     Status: Abnormal   Collection Time: 01/09/15 11:48 AM  Result Value Ref Range   Glucose-Capillary 234 (H) 65 - 99 mg/dL  Glucose, capillary     Status: Abnormal   Collection Time: 01/09/15   4:31 PM  Result Value Ref Range   Glucose-Capillary 238 (H) 65 - 99 mg/dL  Glucose, capillary     Status: Abnormal   Collection Time: 01/09/15  8:52 PM  Result Value Ref Range   Glucose-Capillary 331 (H) 65 - 99 mg/dL  Protime-INR     Status: Abnormal   Collection Time: 01/10/15  6:36 AM  Result Value Ref Range   Prothrombin Time 24.7 (H) 11.6 - 15.2 seconds   INR 2.21 (H) 0.00 - 1.49  Glucose, capillary     Status: Abnormal   Collection Time: 01/10/15  6:59 AM  Result Value Ref Range   Glucose-Capillary 235 (H) 65 - 99 mg/dL  Glucose, capillary     Status: Abnormal   Collection Time: 01/10/15 12:02 PM  Result Value Ref Range   Glucose-Capillary 191 (H) 65 - 99 mg/dL  Glucose, capillary     Status: Abnormal   Collection Time: 01/10/15  4:27 PM  Result Value Ref Range   Glucose-Capillary 245 (H) 65 - 99 mg/dL  Glucose, capillary     Status: Abnormal   Collection Time: 01/10/15  9:24 PM  Result Value Ref Range   Glucose-Capillary 257 (H) 65 - 99 mg/dL  Glucose, capillary     Status: Abnormal   Collection Time: 01/11/15  6:42 AM  Result Value Ref Range   Glucose-Capillary 182 (H) 65 - 99 mg/dL     HEENT: normal Cardio: RRR Resp: CTA B/L GI: BS positive and NT, ND Extremity:  Edema left stump Skin:   Wound Coban wrap on left BKA Neuro: Alert/Oriented and Abnormal Motor 5/5 in BUE, 5/5 in B HF, right KE, 4- LKE Musc/Skel:  Swelling left BKA GEN NAD     Assessment/Plan: 1. Functional deficits secondary to Left BKA revision which require 3+ hours per day of interdisciplinary therapy in a comprehensive inpatient rehab setting. Physiatrist is providing close team supervision and 24 hour management of active medical problems listed below. Physiatrist and rehab team continue to assess barriers to discharge/monitor patient progress toward functional and medical goals.  FIM: FIM - Bathing Bathing Steps Patient Completed: Chest, Right Arm, Left Arm, Abdomen, Front perineal area,  Buttocks, Right upper leg, Left upper leg Bathing: 4: Steadying assist  FIM - Upper Body Dressing/Undressing Upper body dressing/undressing steps patient completed: Thread/unthread right sleeve of pullover shirt/dresss, Thread/unthread left sleeve of pullover shirt/dress,  Put head through opening of pull over shirt/dress, Pull shirt over trunk Upper body dressing/undressing: 5: Set-up assist to: Obtain clothing/put away FIM - Lower Body Dressing/Undressing Lower body dressing/undressing steps patient completed: Thread/unthread right underwear leg, Thread/unthread left underwear leg, Thread/unthread right pants leg, Thread/unthread left pants leg, Pull pants up/down, Don/Doff right shoe Lower body dressing/undressing: 3: Mod-Patient completed 50-74% of tasks  FIM - Musician Devices: Grab bar or rail for support Toileting: 1: Total-Patient completed zero steps, helper did all 3  FIM - Radio producer Devices: Recruitment consultant Transfers: 1-Two helpers  FIM - Control and instrumentation engineer Devices: Arm rests Bed/Chair Transfer: 5: Supine > Sit: Supervision (verbal cues/safety issues), 5: Sit > Supine: Supervision (verbal cues/safety issues), 5: Bed > Chair or W/C: Supervision (verbal cues/safety issues), 5: Chair or W/C > Bed: Supervision (verbal cues/safety issues)  FIM - Locomotion: Wheelchair Distance: 150 Locomotion: Wheelchair: 5: Travels 150 ft or more: maneuvers on rugs and over door sills with supervision, cueing or coaxing FIM - Locomotion: Ambulation Locomotion: Ambulation Assistive Devices: Administrator Ambulation/Gait Assistance: 3: Mod assist, 1: +2 Total assist Locomotion: Ambulation: 0: Activity did not occur  Comprehension Comprehension Mode: Auditory Comprehension: 5-Follows basic conversation/direction: With no assist  Expression Expression Mode: Verbal Expression: 5-Expresses basic  needs/ideas: With no assist  Social Interaction Social Interaction: 5-Interacts appropriately 90% of the time - Needs monitoring or encouragement for participation or interaction.  Problem Solving Problem Solving: 3-Solves basic 50 - 74% of the time/requires cueing 25 - 49% of the time  Memory Memory: 4-Recognizes or recalls 75 - 89% of the time/requires cueing 10 - 24% of the time   Medical Problem List and Plan: 1. Functional deficits secondary to left BKA revision, history of right BKA with bilateral prosthesis 2. DVT Prophylaxis/Anticoagulation: Chronic Coumadin therapy for PAF, ASA, Plavix 3. Pain Management: Lyrica 75 mg twice a day, Oxycodone and Robaxin as needed. Monitor with increased mobility 4. Acute blood loss anemia. Follow-up CBC/continue Niferex 5. Neuropsych: This patient is capable of making decisions on his own behalf. 6. Skin/Wound Care: Routine skin checks 7. Fluids/Electrolytes/Nutrition: Routine I nose with follow-up chemistries 8. Diabetes mellitus with peripheral neuropathy. Check blood sugars before meals and at bedtime. Levemir 45 units qhs.am CBG improved but daytime CBGs still elevated home dose is 25 Units in am and 35 Units in pm, adjust 9. Nonischemic cardiomyopathy/PAF/ICD. Coreg 12.5 mg twice a day, , Aldactone 12.5 mg daily at bedtime. Monitor with increased mobility 10. Diastolic congestive heart failure. Lasix 40 mg twice daily. Monitor for any signs of fluid overload 11. Hyperlipidemia. Lipitor 12. GERD. Protonix 13.  Dysuria check UA LOS (Days) 3 A FACE TO FACE EVALUATION WAS PERFORMED  Arriana Lohmann E 01/11/2015, 7:11 AM

## 2015-01-11 NOTE — Progress Notes (Signed)
Physical Therapy Session Note  Patient Details  Name: [name] [name] MRN: [id number] Date of Birth: 26-Feb-1946  Today's Date: 01/11/2015 PT Individual Time: 1100-1205 PT Individual Time Calculation (min): 65 min   Short Term Goals: Week 1:  PT Short Term Goal 1 (Week 1): =LTG's due to ELOS  Skilled Therapeutic Interventions/Progress Updates:   Pt received sitting in w/c in room, wife Enid Derry present to observe session for family education and training.  Provided education to wife prior to session that pt would remain at w/c level once at home for increased safety until LLE is healed and can don B prosthesis again for standing and gait.  Also educated pt and wife on concerns for R prosthesis and the need for [employer] to check prosthesis to determine if proper fit.  Skilled session focused on functional transfers with w/c<>car, w/c<>mat to simulate bed and w/c<>ADL apt bed.  Performed all transfers at S to min A level with cues to pt and wife on how to cue pt for w/c set up, removal of parts, and how to cue pt for R foot placement and forward weight shift during transfers.  Educated and demonstrated transfer with adequate forward weight shift, however pt still not able to perform.  Note that wife states pt fell forward out of recliner at home before, and has since been fearful of falling.  Performed bed mobility at S level on therapy mat and in ADL bed.  Wife concerned that he won't be able to don brace and liner, etc, therefore had pt perform this at EOB at S level.  [name] from [place] in during end of session to assess R prosthesis.  Note that he will add tibial padding to prevent WB on anterior end of limb and also educated on not having shrinker on under liner (has only done today).  Also [employer] to bring new liner and shrinker in tomorrow for improved swelling control.  Transferred back to w/c via posterior scooting.  Once back in w/c, pt became sick with emesis.  RN aware and provided meds.   Assisted pt back to room with all needs in reach and new R amputee pad until pt gets prosthesis back.    Therapy Documentation Precautions:  Precautions Precautions: Fall Precaution Comments: hx of dementia, R BKA as well Restrictions Weight Bearing Restrictions: Yes LLE Weight Bearing: Non weight bearing   Pain: Pt with no c/o pain, but feeling nauseated during session, RN made aware.  Locomotion : Wheelchair Mobility Distance: 150   See FIM for current functional status  Therapy/Group: Individual Therapy  Denice Bors 01/11/2015, 1:38 PM

## 2015-01-11 NOTE — Progress Notes (Signed)
Occupational Therapy Session Note  Patient Details  Name: [name] [name] MRN: [id number] Date of Birth: Apr 05, 1946  Today's Date: 01/11/2015 OT Individual Time: 1000-1100 OT Individual Time Calculation (min): 60 min    Short Term Goals: Week 1:  OT Short Term Goal 1 (Week 1): STG=LTG  Skilled Therapeutic Interventions/Progress Updates:  Upon entering the room, pt seated in wheelchair. Pt reporting stomach ache from breakfast food this session. Pt appearing the be sweating while sitting in chair. BP taken while seated at 124/75 and 84 bpm. RN alerted that pt feeling unwell. Scoot pivot transfer from wheelchair <> drop arm commode. Pt required min verbal cues for set up of wheelchair and commode chair. Therapist steadied equipment while pt performed transfer with supervision. Pt required increased time and performed lateral leans with steady assist for LB clothing management. Pt requiring assist with clothing towards the end secondary to fatigue. Pt unable to void. Pt with continued c/o nausea during session but requesting to continue. Pt perform 2 sets of 10 wheelchair push ups and 2 sets of 10 anterior weight shifts to lean forward and tap target of therapist shoulder with B hands. Pt requiring rest break between each set. His wife entered room at end of session. Therapist discussed pt progress towards OT goals prior to therapist exiting. Call bell and all needed items within reach upon exiting the room.   Therapy Documentation Precautions:  Precautions Precautions: Fall Precaution Comments: hx of dementia, R BKA as well Restrictions Weight Bearing Restrictions: Yes LLE Weight Bearing: Non weight bearing Pain: Pain Assessment Pain Assessment: 0-10 Pain Score: 5  Pain Type: Surgical pain Pain Location: Leg Pain Orientation: Left Pain Descriptors / Indicators: Aching Pain Intervention(s): Medication (See eMAR);Repositioned ADL: ADL ADL Comments: see FIM  See FIM for current  functional status  Therapy/Group: Individual Therapy  [name] 01/11/2015, 12:50 PM

## 2015-01-11 NOTE — Progress Notes (Signed)
Orthopedic Tech Progress Note Patient Details:  [name] [name] [birth year] [id number]  Patient ID: [name], male   DOB: April 05, 1946, 69 y.o.   MRN: [id number] Called in [employer] brace order; spoke with [name] [name], [name] [name] 01/11/2015, 9:36 AM

## 2015-01-11 NOTE — Progress Notes (Signed)
ANTICOAGULATION CONSULT NOTE - Follow Up Consult  Pharmacy Consult for Coumadin Indication: atrial fibrillation  No Known Allergies  Patient Measurements: Height: 5\' 9"  (175.3 cm) Weight: 185 lb 10 oz (84.2 kg) IBW/kg (Calculated) : 70.7 Heparin Dosing Weight:   Vital Signs: Temp: 97.7 F (36.5 C) (05/19 0553) Temp Source: Oral (05/19 0553) BP: 114/74 mmHg (05/19 0805) Pulse Rate: 81 (05/19 0805)  Labs:  Recent Labs  01/09/15 0540 01/10/15 0636 01/11/15 0730  HGB 11.1*  --   --   HCT 33.4*  --   --   PLT 237  --   --   LABPROT 21.8* 24.7* 27.6*  INR 1.88* 2.21* 2.55*  CREATININE 1.41*  --   --     Estimated Creatinine Clearance: 48.1 mL/min (by C-G formula based on Cr of 1.41).   Medications:  Scheduled:  . aspirin  81 mg Oral Daily  . atorvastatin  10 mg Oral Daily  . carvedilol  12.5 mg Oral BID WC  . cholecalciferol  1,000 Units Oral Daily  . clopidogrel  75 mg Oral Q breakfast  . furosemide  40 mg Oral BID  . insulin aspart  0-15 Units Subcutaneous TID WC  . insulin detemir  15 Units Subcutaneous Daily  . insulin detemir  35 Units Subcutaneous QHS  . iron polysaccharides  150 mg Oral Daily  . multivitamin with minerals  1 tablet Oral Daily  . pantoprazole  80 mg Oral Q1200  . potassium chloride SA  20 mEq Oral BID  . pregabalin  75 mg Oral BID  . senna-docusate  1 tablet Oral BID  . spironolactone  12.5 mg Oral QHS  . vitamin E  1,000 Units Oral Daily  . warfarin  10 mg Oral Once per day on Mon Thu Sat  . warfarin  15 mg Oral Once per day on Sun Tue Wed Fri  . Warfarin - Pharmacist Dosing Inpatient   Does not apply q1800    Assessment: 69yo male with AFib, continuing on home dose of Coumadin.  INR up to 2.55, but receives lower dose today.  No bleeding noted.  Goal of Therapy:  INR 2-3 Monitor platelets by anticoagulation protocol: Yes   Plan:  Continue current dose Watch for s/s of bleeding  Gracy Bruins, PharmD [facility]

## 2015-01-11 NOTE — Progress Notes (Signed)
Social Work Assessment and Plan  Patient Details  Name: [name] [name] MRN: [id number] Date of Birth: 10-Dec-1945  Today's Date: 01/11/2015  Problem List:  Patient Active Problem List   Diagnosis Date Noted  . Diabetes mellitus with neuropathy 01/10/2015  . Amputation of right lower extremity below knee 01/08/2015  . Below knee amputation status 01/03/2015  . Atrial fibrillation [I48.91] 11/01/2014  . Encounter for therapeutic drug monitoring 11/01/2014  . S/P bilateral BKA (below knee amputation) 02/02/2014  . Osteomyelitis of right foot 01/30/2014  . 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 01/04/2014  . Osteomyelitis of toe of right foot 01/04/2014  . S/P Lt BKA 11/09/13 11/14/2013  . Acute blood loss anemia 11/11/2013  . Type II or unspecified type diabetes mellitus 11/09/2013  . Lower limb amputation, great toe 10/11/2013  . Lower limb amputation, other toe(s) 10/11/2013  . Chronic osteomyelitis of toe of left foot 10/04/2013  . Foot osteomyelitis, left 10/04/2013  . Uncontrolled pain, Lt toe 09/21/2013  . Gangrenous toe, Lt toe 09/21/2013  . PAD (peripheral artery disease) 09/13/2013  . Diabetes mellitus 09/13/2013  . Hyperlipidemia 09/13/2013  . PAF (paroxysmal atrial fibrillation) 09/13/2013  . Critical lower limb ischemia- s/p Rt anterior tibial PTA 12/29/13 in preparation for Rt BKA 08/30/2013  . 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 HTN (hypertension) 04/08/2011  . NICM- EF 20-25% echo 6/14 09/29/2008  . LBBB 09/29/2008  . SYSTOLIC HEART FAILURE, CHRONIC 09/29/2008   Past Medical History:  Past Medical History  Diagnosis Date  . Cardiomyopathy, nonischemic     a. Cath 2003: mild nonobstructive CAD, EF 25% at that time.  [name] PAF (paroxysmal atrial fibrillation)     a. Noted on ICD interrogation 2012;  b. coumadin d/c'd 01/2013.  [name] NSVT (nonsustained ventricular tachycardia)     a. Noted on ICD interrogation in 2011.  . High cholesterol      takes Atorvastatin daily  . PAD (peripheral artery disease)     a. 08/2013 Periph Angio/PTA: Abd Ao nl, RLE- 3v runoff, PT diff dzs, AT 90p, LLE 2v runoff, PT 100, AT 1m (diamondback ORA/chocolate balloon PTA).  . Automatic implantable cardioverter-defibrillator in situ     a. 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  . Dementia   . Arthritis   . History of blood transfusion     no abnormal reaction noted  . Complication of anesthesia   . PONV (postoperative nausea and vomiting)   . Hypertension     takes Coreg daily  . LBBB (left bundle branch block)     takes Coumadin daily  . Anemia     takes Iron pill daily  . Constipation     takes Sennokot daily  . GERD (gastroesophageal reflux disease)     takes Nexium daily  . Chronic systolic CHF (congestive heart failure)     takes Furosemide daily as well as Aldactone  . Pneumonia     hx of > 70yr ago  . History of bronchitis     > 1 yr ago  . Headache     occasionally  . Peripheral neuropathy     hands;numbness and tingling   . Urinary frequency   . Urinary urgency   . History of kidney stones   . Type II diabetes mellitus     takes Levemir daily as well as Novolog  . CAD (coronary artery disease)    Past Surgical History:  Past Surgical History  Procedure Laterality Date  . Lithotripsy  2001  . Cervical spine surgery  1994  . Cardiac defibrillator placement  06/2009    WITH GENERATOR REPLACED; BiV ICD  . US echocardiography  03/21/2008    EF 30-35%  . Cardiovascular stress test  03/20/2009    EF 33%  . Transluminal atherectomy tibial artery Left 09/12/2013  . Toe amputation  10/04/2013    LEFT GREAT TOE AND 4TH TOE   /   DR Erlinda Hong  . Amputation Left 10/04/2013    Procedure: LEFT GREAT TOE AND SECOND TOE AMPUTATION;  Surgeon: Marianna Payment, MD;  Location: [place];  Service: Orthopedics;  Laterality: Left;  . Colonoscopy    . Leg amputation below knee Left 11/09/2013    DR Erlinda Hong  . Amputation Left  11/09/2013    Procedure: LEFT AMPUTATION BELOW KNEE;  Surgeon: Marianna Payment, MD;  Location: [place];  Service: Orthopedics;  Laterality: Left;  . Amputation Right 01/04/2014    Procedure: Doristine Devoid second and third toe amputation;  Surgeon: Marianna Payment, MD;  Location: [place];  Service: Orthopedics;  Laterality: Right;  . Amputation Right 01/30/2014    Procedure: RIGHT BELOW KNEE AMPUTATION;  Surgeon: Marianna Payment, MD;  Location: [place];  Service: Orthopedics;  Laterality: Right;  . Lower extremity angiogram Bilateral 09/12/2013    Procedure: LOWER EXTREMITY ANGIOGRAM;  Surgeon: Lorretta Harp, MD;  Location: [facility] CATH LAB;  Service: Cardiovascular;  Laterality: Bilateral;  . Abdominal angiogram  09/12/2013    Procedure: ABDOMINAL ANGIOGRAM;  Surgeon: Lorretta Harp, MD;  Location: [facility] CATH LAB;  Service: Cardiovascular;;  . Lower extremity angiogram N/A 12/29/2013    Procedure: LOWER EXTREMITY ANGIOGRAM;  Surgeon: Lorretta Harp, MD;  Location: [facility] CATH LAB;  Service: Cardiovascular;  Laterality: N/A;  . Atherectomy Right 12/29/2013    Procedure: ATHERECTOMY;  Surgeon: Lorretta Harp, MD;  Location: [facility] CATH LAB;  Service: Cardiovascular;  Laterality: Right;  Anterior Tibial Artery  . Cardiac catheterization  10/01/2001    THERE WAS GLOBAL HYPOKINESIS AND EF 25%. THERE APPEARED TO BE GLOBAL DECREASE IN WALL MOTION  . Esophagogastroduodenoscopy    . Stump revision Left 01/03/2015    Procedure: LEFT BELOW KNEE AMPUTATION REVISION;  Surgeon: Leandrew Koyanagi, MD;  Location: [place];  Service: Orthopedics;  Laterality: Left;   Social History:  reports that he has never smoked. He has never used smokeless tobacco. He reports that he does not drink alcohol or use illicit drugs.  Family / Support Systems Marital Status: Married How Long?: 56 years Patient Roles: Spouse, Parent (has a dtr and 2 sons) Spouse/Significant Other: Mattthew Ziomek - wife - [phone] (h); [phone] (m) Children: one  dtr lives with pt and 2 sons are local Anticipated Caregiver: wife Ability/Limitations of Caregiver: Wife is retired and can provide some supervision, but she is disabled and cannot provide physical care.  Dtr works Warehouse manager and is in and out. Caregiver Availability: 24/7 Family Dynamics: supportive wife, but limited in what she is able/willing to provide in re: to his care at home  Social History Preferred language: English Religion: Baptist Read: Yes Write: Yes Employment Status: Disabled Date Retired/Disabled/Unemployed: 10 years Public relations account executive Issues: none reported Guardian/Conservator: N/A   Abuse/Neglect Physical Abuse: Denies Verbal Abuse: Denies Sexual Abuse: Denies Exploitation of patient/patient's resources: Denies Self-Neglect: Denies  Emotional Status Pt's affect, behavior and adjustment status: Pt has a positive outlook on his  recovery, but became tearful when talking about his wife and all he's put her through and what a good wife she's been. Recent Psychosocial Issues: Pt reports finances can be tight. Psychiatric History: dementia, but not psychiatric history Substance Abuse History: none reported  Patient / Family Perceptions, Expectations & Goals Pt/Family understanding of illness & functional limitations: Pt/wife have an understanding of pt's condition.  Have spoken with the PA to have questions answered. Premorbid pt/family roles/activities: Pt enjoys going to church, spending time with family, and tending to his garden. Anticipated changes in roles/activities/participation: Pt would like to get back to his garden and church as soon as possible. Pt/family expectations/goals: Pt wants to take advantage of all the rehab for which he is eligible.  Pt wants to be as independent as possible.  Community [employer] Agencies: None Premorbid Home Care/DME Agencies: Other (Comment) (had [facility] in the past.  Has a walker, wheelchair,  bedside commode.  Has a borrowed tub bench from his friend.) Transportation available at discharge: family Resource referrals recommended: Neuropsychology, Support group (specify)  Discharge Planning Living Arrangements: Spouse/significant other, Children (dtr at home) Support Systems: Spouse/significant other, Children, Other relatives, Friends/neighbors, Church/faith community Type of Residence: Private residence Insurance Resources: Multimedia programmer (specify) (Marine scientist) Financial Resources: Radio broadcast assistant Screen Referred: No Money Management: Patient, Spouse Does the patient have any problems obtaining your medications?: No Home Management: Pt's wife takes care of the home. Patient/Family Preliminary Plans: Pt plans to return to his home with his wife and dtr to assist as needed, although pt's goals are supervision. Barriers to Discharge: Steps (although pt has a ramp) Social Work Anticipated Follow Up Needs: HH/OP, Support Group Expected length of stay: 5 days  Clinical Impression CSW met with pt on 01-09-15 and then with pt and wife on 01-10-15 to introduce self and role of CSW, as well as complete assessment.  Pt lives with his wife and dtr and plans to return to his home at d/c with wife to provide supervision.  Wife expressed concern that pt has not had enough therapy to go home.  CSW encouraged her to come in for therapies 01-11-15 so that she could see pt's progress.  She is available to provide 24/7 supervision, but wants pt to be as strong as possible.  Pt has been to a skilled nursing facility and does not want to return, he prefers to go home and his needs do not warrant a SNF stay.  CSW will meet with pt and his wife after family education today to see how wife is feeling about pt's readiness for d/c.  CSW alerted the PA and therapy team to her concerns.  CSW will continue to follow and assist as needed.  Gerarda Conklin, Silvestre Mesi 01/11/2015, 9:32 AM

## 2015-01-11 NOTE — Progress Notes (Signed)
Occupational Therapy Session Note  Patient Details  Name: LEO FRAY MRN: [id number] Date of Birth: [birth year]  Today's Date: 01/11/2015 OT Individual Time: 7616-0737 OT Individual Time Calculation (min): 59 min    Short Term Goals: Week 1:  OT Short Term Goal 1 (Week 1): STG=LTG  Skilled Therapeutic Interventions/Progress Updates:    Pt performed bathing and dressing sitting EOB this session.  Min assist for transfer to the EOB from flat surface as pt struggled to bring trunk up to sitting.  He tends to use a lot of extensor movements to scoot instead of flexing his trunk.  Lateral leans side to side with supervision for for washing his peri area and pulling pants over hips.  He was also able to donn his prosthesis with setup help as well.  Min assist for pt to transfer to the wheelchair scoot pivot.  Therapist assisted with holding wheelchair so it would not move during transfer.  Pt still with decreased forward flexion noted during transition.  He completed session by performing grooming tasks at the sink with modified independence.   Therapy Documentation Precautions:  Precautions Precautions: Fall Precaution Comments: hx of dementia, R BKA as well Restrictions Weight Bearing Restrictions: Yes LLE Weight Bearing: Non weight bearing  Vital Signs: Therapy Vitals Temp: 97.7 F (36.5 C) Temp Source: Oral Pulse Rate: 81 Resp: 18 BP: 114/74 mmHg Patient Position (if appropriate): Lying Oxygen Therapy SpO2: 100 % O2 Device: Not Delivered Pain: Pain Assessment Pain Assessment: 0-10 Pain Score: 5  Pain Type: Surgical pain Pain Location: Leg Pain Orientation: Left Pain Descriptors / Indicators: Aching Pain Intervention(s): Medication (See eMAR);Repositioned ADL: See FIM for current functional status  Therapy/Group: Individual Therapy  Rolin Schult OTR/L 01/11/2015, 9:01 AM

## 2015-01-11 NOTE — IPOC Note (Signed)
Overall Plan of Care [facility]) Patient Details Name: [name] [name] MRN: [id number] DOB: [birth year]  Admitting Diagnosis: REVISIONS L BKA WITH PROSTHESIS  Hospital Problems: Principal Problem:   Amputation of right lower extremity below knee Active Problems:   BiV ICD (BS).  ICD in '05, BiV ICD 11/10   PAD (peripheral artery disease)   S/P bilateral BKA (below knee amputation)   Diabetes mellitus with neuropathy     Functional Problem List: Nursing Endurance, Medication Management, Pain, Safety, Skin Integrity  PT Balance, Endurance, Motor, Safety, Skin Integrity  OT Balance, Edema, Endurance, Motor, Pain, Safety  SLP    TR         Basic ADL's: OT Grooming, Bathing, Dressing, Toileting     Advanced  ADL's: OT       Transfers: PT Bed Mobility, Bed to Chair, Car, Manufacturing systems engineer, Metallurgist: PT Wheelchair Mobility     Additional Impairments: OT None  SLP        TR      Anticipated Outcomes Item Anticipated Outcome  Self Feeding n/a  Swallowing      Basic self-care  supervision  Toileting  supervision   Bathroom Transfers supervision  Bowel/Bladder  patient is and will remain continent of bowel and bladder  Transfers  S   Locomotion  S w/c level  Communication     Cognition     Pain  pain less than or equal to 4 on a scale of 0-10  Safety/Judgment  patient will display appropriate safety judgement and remain free from falls/injury   Therapy Plan: PT Intensity: Minimum of 1-2 x/day ,45 to 90 minutes PT Frequency: 5 out of 7 days PT Duration Estimated Length of Stay: 5 days  OT Intensity: Minimum of 1-2 x/day, 45 to 90 minutes OT Frequency: 5 out of 7 days OT Duration/Estimated Length of Stay: 5-7 days         Team Interventions: Nursing Interventions Patient/Family Education, Pain Management, Medication Management, Skin Care/Wound Management, Cognitive Remediation/Compensation, Discharge Planning, Disease  Management/Prevention  PT interventions Ambulation/gait training, Cognitive remediation/compensation, Discharge planning, Disease management/prevention, DME/adaptive equipment instruction, Functional mobility training, Pain management, Patient/family education, Psychosocial support, Skin care/wound management, Therapeutic Activities, Therapeutic Exercise, UE/LE Strength taining/ROM, Wheelchair propulsion/positioning  OT Interventions Balance/vestibular training, Cognitive remediation/compensation, Discharge planning, Functional mobility training, Therapeutic Activities, UE/LE Coordination activities, Patient/family education, Community reintegration, Pain management, Skin care/wound managment, DME/adaptive equipment instruction, Self Care/advanced ADL retraining, Therapeutic Exercise, Wheelchair propulsion/positioning  SLP Interventions    TR Interventions    SW/CM Interventions Discharge Planning, Psychosocial Support, Patient/Family Education    Team Discharge Planning: Destination: PT-Home ,OT- Home , SLP-  Projected Follow-up: PT-Home health PT, 24 hour supervision/assistance, OT-  Home health OT, SLP-  Projected Equipment Needs: PT-To be determined, OT- To be determined, SLP-  Equipment Details: PT- , OT-  Patient/family involved in discharge planning: PT- Patient,  OT-Patient, SLP-   MD ELOS: 5-7d Medical Rehab Prognosis:  Good Assessment: 69 y.o. right handed male with history of diabetes mellitus with peripheral neuropathy, diastolic congestive heart failure, nonischemic cardiomyopathy, PAF with ICD with chronic Coumadin, peripheral vascular disease. [name] [name] lives with his wife used a wheelchair as well as bilateral prosthesis provided by [employer] prosthetics prior to admission. 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 He was discharged to home from latest rehabilitation admission in June 2015.  Presented 01/03/2015 with developing  wounds distal end of tibial osteotomy of left below-knee amputation site. Underwent revision of left BKA 01/03/2015 per Dr.Xu   Now requiring 24/7 Rehab RN,MD, as well as CIR level PT, OT .  Treatment team will focus on ADLs and mobility with goals set at [facility] I/Sup  See Team Conference Notes for weekly updates to the plan of care

## 2015-01-11 NOTE — Progress Notes (Signed)
AT 1030, writer was called to the therapy room because this patient was nauseated, emesis appeared to be remnants of breakfast.  Patient was returned to his room and given 4mg  zofran po. Within 20 minutes patient stated that he was feeling better and when lunch arrived, he was able to eat without complication.

## 2015-01-11 NOTE — Discharge Summary (Signed)
Discharge summary job # [id number]

## 2015-01-11 NOTE — Progress Notes (Addendum)
Physical Therapy Session Note  Patient Details  Name: [name] [name] MRN: [id number] Date of Birth: Nov 22, 1945  Today's Date: 01/11/2015 PT Individual Time: 1430-1500 PT Individual Time Calculation (min): 30 min   Short Term Goals: Week 1:  PT Short Term Goal 1 (Week 1): =LTG's due to ELOS  Skilled Therapeutic Interventions/Progress Updates:   Pain L residual limb rated 6/10; premedicated. He stated nausea was mild compared to AM.  Pt stated that [employer] came today and took R prosthesis for modification.  W/c propulsion x 150' on level tile and carpet, in controlled and home settings.  Pt had difficulty backing up through doorway and between furniture, but self corrected.  Distant supervision, with extra time.  Pt reported he needed to urinate, urgently. Pt provided with urinal. Voided 250 ml; NT informed.  Pt then informed PT that he was supposed to have UA done today, due to burning and urgency.  RN informed.  Hand hygiene at sink, modified independent negotiating w/c in congested BR of ADL apt, to get to sink.  Therapeutic activites in sitting in w/c, bil residual limbs on limb supports of leg rests, avoiding wt bearing end of L residual limb, with mod assist fading to supervision with cues, for:  -pelvic dissociation via lateral leans and "walking" hips forward/back in w/c -trunk flexion/extension with bil UEs reaching out of BOS, to facilitate forward wt shifting during transfers     Pt returned to room and all needs left within reach.  Therapy Documentation Precautions:  Precautions Precautions: Fall Precaution Comments: hx of dementia, R BKA as well Restrictions Weight Bearing Restrictions: Yes LLE Weight Bearing: Non weight bearing     Locomotion : Wheelchair Mobility Distance: 150     See FIM for current functional status  Therapy/Group: Individual Therapy  Deshara Rossi 01/11/2015, 3:20 PM

## 2015-01-12 ENCOUNTER — Inpatient Hospital Stay (HOSPITAL_COMMUNITY): Payer: Medicare Other | Admitting: Occupational Therapy

## 2015-01-12 ENCOUNTER — Inpatient Hospital Stay (HOSPITAL_COMMUNITY): Payer: Medicare Other | Admitting: Rehabilitation

## 2015-01-12 DIAGNOSIS — N3000 Acute cystitis without hematuria: Secondary | ICD-10-CM

## 2015-01-12 LAB — PROTIME-INR
INR: 2.82 — ABNORMAL HIGH (ref 0.00–1.49)
Prothrombin Time: 29.2 seconds — ABNORMAL HIGH (ref 11.6–15.2)

## 2015-01-12 LAB — GLUCOSE, CAPILLARY
GLUCOSE-CAPILLARY: 218 mg/dL — AB (ref 65–99)
GLUCOSE-CAPILLARY: 152 mg/dL — AB (ref 65–99)
Glucose-Capillary: 285 mg/dL — ABNORMAL HIGH (ref 65–99)
Glucose-Capillary: 406 mg/dL — ABNORMAL HIGH (ref 65–99)
Glucose-Capillary: 293 mg/dL — ABNORMAL HIGH (ref 65–99)

## 2015-01-12 MED ORDER — INSULIN ASPART 100 UNIT/ML ~~LOC~~ SOLN
0.0000 [IU] | Freq: Every day | SUBCUTANEOUS | Status: DC
  Administered 2015-01-12: 3 [IU] via SUBCUTANEOUS
  Administered 2015-01-14: 2 [IU] via SUBCUTANEOUS

## 2015-01-12 MED ORDER — INSULIN ASPART 100 UNIT/ML ~~LOC~~ SOLN
0.0000 [IU] | Freq: Three times a day (TID) | SUBCUTANEOUS | Status: DC
  Administered 2015-01-13 (×2): 4 [IU] via SUBCUTANEOUS
  Administered 2015-01-13: 3 [IU] via SUBCUTANEOUS
  Administered 2015-01-14: 4 [IU] via SUBCUTANEOUS
  Administered 2015-01-14: 7 [IU] via SUBCUTANEOUS
  Administered 2015-01-15: 4 [IU] via SUBCUTANEOUS

## 2015-01-12 MED ORDER — OXYCODONE HCL 5 MG PO TABS
5.0000 mg | ORAL_TABLET | ORAL | Status: DC | PRN
  Administered 2015-01-14: 10 mg via ORAL
  Filled 2015-01-12: qty 2

## 2015-01-12 MED ORDER — CEPHALEXIN 250 MG PO CAPS
250.0000 mg | ORAL_CAPSULE | Freq: Three times a day (TID) | ORAL | Status: DC
  Administered 2015-01-12 – 2015-01-14 (×7): 250 mg via ORAL
  Filled 2015-01-12 (×10): qty 1

## 2015-01-12 MED ORDER — INSULIN DETEMIR 100 UNIT/ML ~~LOC~~ SOLN
25.0000 [IU] | Freq: Every day | SUBCUTANEOUS | Status: DC
  Administered 2015-01-12 – 2015-01-13 (×2): 25 [IU] via SUBCUTANEOUS
  Filled 2015-01-12 (×2): qty 0.25

## 2015-01-12 MED ORDER — INSULIN DETEMIR 100 UNIT/ML ~~LOC~~ SOLN
10.0000 [IU] | Freq: Once | SUBCUTANEOUS | Status: AC
  Administered 2015-01-12: 10 [IU] via SUBCUTANEOUS
  Filled 2015-01-12: qty 0.1

## 2015-01-12 NOTE — Progress Notes (Signed)
Occupational Therapy Session Note  Patient Details  Name: [name] [name] MRN: [id number] Date of Birth: 04/23/1946  Today's Date: 01/12/2015 OT Individual Time: 1005-1059 and 1430-1530 OT Individual Time Calculation (min): 54 min and 60 min    Short Term Goals: Week 1:  OT Short Term Goal 1 (Week 1): STG=LTG  Skilled Therapeutic Interventions/Progress Updates:  Session 1: Upon entering the room, pt seated in wheelchair awaiting therapist with 6/10 c/o pain in L stump during session. Pt reports receiving medication prior to therapist entering the room. Pt propelled self to day room via B UEs and required min verbal cues for set up to transfer onto love seat. Pt performing scoot pivot transfer with close supervision and therapist steadying equipment for transfer onto uneven surface. Pt initially having difficulty "powering up" with B UEs in order to raise bottom up to clear wheelchair. Pt with increased verbal cues for anterior weight shift in order to increase successfulness with transfer. Pt returning to room for practice transfer onto drop arm commode with was performed with therapist steadying equipment and supervision. Verbal cues continued to be needed to lock L brake of wheelchair before transferring. Pt seated in wheelchair with call bell and all needed items within reach upon exiting the room.    Session 2: Upon entering the room, pt seated in wheelchair with no c/o pain this session. OT educated and demonstrated B UE strengthening exercises with use of level 3 resistance theraband. Pt performing 2 sets of 10 chest pulls, alternating punches, shoulder flexion, shoulder diagonals, and bicep curls with rest breaks as needed secondary to fatigue. Pt reporting needing to use bathroom for BM. Pt performing scoot pivot wheelchair <> drop arm commode chair with supervision and therapist steadying equipment for safety. Pt able to performing hygiene and LB clothing management with lateral leans  and increased time. Pt able to have successful BM. He required min verbal cues for set up of equipment for transfer. Pt returning to wheelchair with call bell and all needed items within reach upon exiting the room.   Therapy Documentation Precautions:  Precautions Precautions: Fall Precaution Comments: hx of dementia, R BKA as well Restrictions Weight Bearing Restrictions: Yes LLE Weight Bearing: Non weight bearing ADL: ADL ADL Comments: see FIM  See FIM for current functional status  Therapy/Group: Individual Therapy  [name] 01/12/2015, 12:49 PM

## 2015-01-12 NOTE — Progress Notes (Signed)
69 y.o. right handed male with history of diabetes mellitus with peripheral neuropathy, diastolic congestive heart failure, nonischemic cardiomyopathy, PAF with ICD with chronic Coumadin, peripheral vascular disease. [name] [name] lives with his wife used a wheelchair as well as bilateral prosthesis provided by [employer] prosthetics prior to admission. 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 He was discharged to home from latest rehabilitation admission in June 2015. Presented 01/03/2015 with developing wounds distal end of tibial osteotomy of left below-knee amputation site. Underwent revision of left BKA 01/03/2015 per Dr.Xu. Hospital course pain management. Patient remains on aspirin and Plavix therapy as prior to admission along with chronic Coumadin resumed for PAF  Subjective/Complaints: Burning with urination improved vs yesterday Became nauseated in PT yesterday Prosthetist making adjustment to RIght BK prosthesis to improve tolerance  Review of Systems - Negative except constipation and left stump pain Objective: Vital Signs: Blood pressure 99/63, pulse 79, temperature 98.3 F (36.8 C), temperature source Oral, resp. rate 18, height 5\' 9"  (1.753 m), weight 84.2 kg (185 lb 10 oz), SpO2 100 %. No results found. Results for orders placed or performed during the hospital encounter of 01/08/15 (from the past 72 hour(s))  Glucose, capillary     Status: Abnormal   Collection Time: 01/09/15 11:48 AM  Result Value Ref Range   Glucose-Capillary 234 (H) 65 - 99 mg/dL  Glucose, capillary     Status: Abnormal   Collection Time: 01/09/15  4:31 PM  Result Value Ref Range   Glucose-Capillary 238 (H) 65 - 99 mg/dL  Glucose, capillary     Status: Abnormal   Collection Time: 01/09/15  8:52 PM  Result Value Ref Range   Glucose-Capillary 331 (H) 65 - 99 mg/dL  Protime-INR     Status: Abnormal   Collection Time:  01/10/15  6:36 AM  Result Value Ref Range   Prothrombin Time 24.7 (H) 11.6 - 15.2 seconds   INR 2.21 (H) 0.00 - 1.49  Glucose, capillary     Status: Abnormal   Collection Time: 01/10/15  6:59 AM  Result Value Ref Range   Glucose-Capillary 235 (H) 65 - 99 mg/dL  Glucose, capillary     Status: Abnormal   Collection Time: 01/10/15 12:02 PM  Result Value Ref Range   Glucose-Capillary 191 (H) 65 - 99 mg/dL  Glucose, capillary     Status: Abnormal   Collection Time: 01/10/15  4:27 PM  Result Value Ref Range   Glucose-Capillary 245 (H) 65 - 99 mg/dL  Glucose, capillary     Status: Abnormal   Collection Time: 01/10/15  9:24 PM  Result Value Ref Range   Glucose-Capillary 257 (H) 65 - 99 mg/dL  Glucose, capillary     Status: Abnormal   Collection Time: 01/11/15  6:42 AM  Result Value Ref Range   Glucose-Capillary 182 (H) 65 - 99 mg/dL  Protime-INR     Status: Abnormal   Collection Time: 01/11/15  7:30 AM  Result Value Ref Range   Prothrombin Time 27.6 (H) 11.6 - 15.2 seconds   INR 2.55 (H) 0.00 - 1.49  Glucose, capillary     Status: Abnormal   Collection Time: 01/11/15 12:19 PM  Result Value Ref Range   Glucose-Capillary 157 (H) 65 - 99 mg/dL   Comment 1 Notify RN   Glucose, capillary     Status: Abnormal   Collection Time: 01/11/15  4:24 PM  Result Value Ref Range  Glucose-Capillary 227 (H) 65 - 99 mg/dL   Comment 1 Notify RN   Urinalysis, Routine w reflex microscopic     Status: Abnormal   Collection Time: 01/11/15  6:50 PM  Result Value Ref Range   Color, Urine YELLOW YELLOW   APPearance TURBID (A) CLEAR   Specific Gravity, Urine 1.013 1.005 - 1.030   pH 8.5 (H) 5.0 - 8.0   Glucose, UA NEGATIVE NEGATIVE mg/dL   Hgb urine dipstick MODERATE (A) NEGATIVE   Bilirubin Urine NEGATIVE NEGATIVE   Ketones, ur NEGATIVE NEGATIVE mg/dL   Protein, ur 100 (A) NEGATIVE mg/dL   Urobilinogen, UA 0.2 0.0 - 1.0 mg/dL   Nitrite NEGATIVE NEGATIVE   Leukocytes, UA LARGE (A) NEGATIVE  Urine  microscopic-add on     Status: Abnormal   Collection Time: 01/11/15  6:50 PM  Result Value Ref Range   WBC, UA 21-50 <3 WBC/hpf   RBC / HPF 3-6 <3 RBC/hpf   Bacteria, UA MANY (A) RARE   Crystals TRIPLE PHOSPHATE CRYSTALS (A) NEGATIVE  Glucose, capillary     Status: Abnormal   Collection Time: 01/11/15  8:55 PM  Result Value Ref Range   Glucose-Capillary 288 (H) 65 - 99 mg/dL  Glucose, capillary     Status: Abnormal   Collection Time: 01/12/15  6:37 AM  Result Value Ref Range   Glucose-Capillary 218 (H) 65 - 99 mg/dL   Comment 1 Notify RN      HEENT: normal Cardio: RRR Resp: CTA B/L GI: BS positive and NT, ND Extremity:  Edema left stump Skin:   Wound Coban wrap on left BKA Neuro: Alert/Oriented and Abnormal Motor 5/5 in BUE, 5/5 in B HF, right KE, 4- LKE Musc/Skel:  Swelling left BKA GEN NAD     Assessment/Plan: 1. Functional deficits secondary to Left BKA revision which require 3+ hours per day of interdisciplinary therapy in a comprehensive inpatient rehab setting. Physiatrist is providing close team supervision and 24 hour management of active medical problems listed below. Physiatrist and rehab team continue to assess barriers to discharge/monitor patient progress toward functional and medical goals.  FIM: FIM - Bathing Bathing Steps Patient Completed: Chest, Right Arm, Left Arm, Abdomen, Front perineal area, Buttocks, Right upper leg, Left upper leg Bathing: 4: Steadying assist  FIM - Upper Body Dressing/Undressing Upper body dressing/undressing steps patient completed: Thread/unthread right sleeve of pullover shirt/dresss, Thread/unthread left sleeve of pullover shirt/dress, Put head through opening of pull over shirt/dress, Pull shirt over trunk Upper body dressing/undressing: 5: Set-up assist to: Obtain clothing/put away FIM - Lower Body Dressing/Undressing Lower body dressing/undressing steps patient completed: Thread/unthread right underwear leg, Thread/unthread  left underwear leg, Thread/unthread right pants leg, Thread/unthread left pants leg, Pull pants up/down, Don/Doff right shoe Lower body dressing/undressing: 4: Steadying Assist  FIM - Toileting Toileting steps completed by patient: Adjust clothing prior to toileting, Adjust clothing after toileting Toileting Assistive Devices: Grab bar or rail for support Toileting: 3: Mod-Patient completed 2 of 3 steps  FIM - Radio producer Devices: Bedside commode (drop arm) Toilet Transfers: 5-To toilet/BSC: Supervision (verbal cues/safety issues), 5-From toilet/BSC: Supervision (verbal cues/safety issues)  FIM - Control and instrumentation engineer Devices: Arm rests Bed/Chair Transfer: 5: Supine > Sit: Supervision (verbal cues/safety issues), 5: Sit > Supine: Supervision (verbal cues/safety issues), 4: Bed > Chair or W/C: Min A (steadying Pt. > 75%), 4: Chair or W/C > Bed: Min A (steadying Pt. > 75%)  FIM - Locomotion: Wheelchair Distance: 150  Locomotion: Wheelchair: 5: Travels 150 ft or more: maneuvers on rugs and over door sills with supervision, cueing or coaxing FIM - Locomotion: Ambulation Locomotion: Ambulation Assistive Devices: Administrator Ambulation/Gait Assistance: 3: Mod assist, 1: +2 Total assist Locomotion: Ambulation: 0: Activity did not occur  Comprehension Comprehension Mode: Auditory Comprehension: 5-Follows basic conversation/direction: With no assist  Expression Expression Mode: Verbal Expression: 5-Expresses basic needs/ideas: With no assist  Social Interaction Social Interaction: 5-Interacts appropriately 90% of the time - Needs monitoring or encouragement for participation or interaction.  Problem Solving Problem Solving: 3-Solves basic 50 - 74% of the time/requires cueing 25 - 49% of the time  Memory Memory: 4-Recognizes or recalls 75 - 89% of the time/requires cueing 10 - 24% of the time   Medical Problem List and  Plan: 1. Functional deficits secondary to left BKA revision, history of right BKA with bilateral prosthesis 2. DVT Prophylaxis/Anticoagulation: Chronic Coumadin therapy for PAF, ASA, Plavix 3. Pain Management: Lyrica 75 mg twice a day, Oxycodone and Robaxin as needed. Monitor with increased mobility 4. Acute blood loss anemia. Follow-up CBC/continue Niferex 5. Neuropsych: This patient is capable of making decisions on his own behalf. 6. Skin/Wound Care: Routine skin checks 7. Fluids/Electrolytes/Nutrition: Routine I nose with follow-up chemistries 8. Diabetes mellitus with peripheral neuropathy. Check blood sugars before meals and at bedtime.CBGs still elevated home dose is 25 Units in am and 35 Units in pm, adjust, UTI may be elevating values 9. Nonischemic cardiomyopathy/PAF/ICD. Coreg 12.5 mg twice a day, , Aldactone 12.5 mg daily at bedtime. Monitor with increased mobility 10. Diastolic congestive heart failure. Lasix 40 mg twice daily. Monitor for any signs of fluid overload 11. Hyperlipidemia. Lipitor 12. GERD. Protonix 13.  Dysuria +UA, start keflex pedning cx LOS (Days) 4 A FACE TO FACE EVALUATION WAS PERFORMED  [name],[name] E 01/12/2015, 7:12 AM

## 2015-01-12 NOTE — Progress Notes (Signed)
Orthopedic Tech Progress Note Patient Details:  [name] [name] [birth year] [id number] Brace order was completed by bio-tech vendor. Patient ID: [name] [name], male   DOB: [birth year], 69 y.o.   MRN: [id number]   [name] 01/12/2015, 7:32 PM

## 2015-01-12 NOTE — Discharge Summary (Signed)
NAMEFINNEAN, CERAMI NO.:  [id number]  MEDICAL RECORD NO.:  [id number]  LOCATION:  4M03C                        FACILITY:  [facility]  PHYSICIAN:  [name], M.D.DATE OF BIRTH:  Oct 20, 1945  DATE OF ADMISSION:  01/08/2015 DATE OF DISCHARGE:  01/15/2015                              DISCHARGE SUMMARY   DISCHARGE DIAGNOSES: 1. Functional deficits secondary to left below-knee amputation     revision with history of right below-knee amputation. 2. Chronic Coumadin for paroxysmal atrial fibrillation. 3. Pain management. 4. Diabetes mellitus with peripheral neuropathy. 5. Nonischemic cardiomyopathy with implantable cardioverter-     defibrillator. 6. Diastolic congestive heart failure. 7. Hyperlipidemia. 8. Gastroesophageal reflux disease. 9.Proteus UTI-bactrim   HISTORY OF PRESENT ILLNESS:  This is a 69 year old right-handed male with history of diabetes mellitus, diastolic congestive heart failure, atrial fibrillation with ICD, on chronic Coumadin, as well as peripheral vascular disease.  The patient lives with his wife, used a wheelchair as well as bilateral prostheses for BKAs.  Well known to Rehab Services from right below-knee amputation in June 2015.  Presented on Jan 03, 2015, with developing wounds distal end of tibial osteotomy of left below-knee amputation site.  Underwent revision of left BKA on Jan 03, 2015, per Dr. Erlinda Hong.  [facility] course, pain management.  Chronic Coumadin resumed for PAF.  Physical and occupational therapy ongoing.  The patient was admitted for comprehensive rehab program.  PAST MEDICAL HISTORY:  See discharge diagnoses.  SOCIAL HISTORY:  Lives with spouse, used prostheses prior to admission. Functional status upon admission to Rehab Services:  Moderate assist squat pivot transfers, minimal guard sit to supine, min-to-mod assist activities of daily living.  PHYSICAL EXAMINATION:  VITAL SIGNS:  Blood pressure 120/62, pulse  85, temperature 99, respirations 18. GENERAL:  This was an alert male, in no acute distress, oriented x3. LUNGS:  Clear to auscultation. CARDIAC:  Rate controlled. ABDOMEN:  Soft, nontender.  Good bowel sounds. EXTREMITIES:  Right BKA site well healed.  Left BKA site dressed without drainage.  REHABILITATION HOSPITAL COURSE:  The patient was admitted to Inpatient Rehab Services with therapies initiated on a 3-hour daily basis consisting of physical therapy, occupational therapy, and rehabilitation nursing.  The following issues were addressed during the patient's rehabilitation stay.  Pertaining to Mr. Brickle left BKA revision, surgical site healing nicely.  He would follow up with Dr. Erlinda Hong of Orthopedic Services.  He did have a prosthesis for right BKA site that was well healed.  Chronic Coumadin ongoing, no bleeding episodes.  For atrial fibrillation, cardiac rate controlled.  He would follow up with Outpatient [facility].  A home health nurse had been arranged. Pain management with Lyrica twice daily as well as oxycodone, Robaxin as needed.  Acute blood loss anemia stable, remained on an iron supplement. He did have a history of diabetes mellitus, peripheral neuropathy, insulin therapy as advised.Proteus UTI with Bactrim initiated 5/22 x 7 days.  Full diabetic teaching.  Blood pressures well controlled.  No orthostatic changes.  He would follow up with his primary MD, Dr. Inda Merlin.  Diastolic congestive heart failure, he remained on Lasix, exhibiting no signs of fluid overload.  The patient received weekly collaborative interdisciplinary team  conferences to discuss estimated length of stay, family teaching, any barriers to his discharge.  He was progressing nicely in all areas.  Modified independent to propel his chair, supervision overall for strengthening and activity tolerance.  Performed all transfers at minimal guard to close supervision level.  He was able to gather  belongings for activities of living; dressing, grooming, homemaking with good safety awareness.  He was able to donn and doff his prostheses.  Full family teaching completed and discharged home.  DISCHARGE MEDICATIONS: 1. Aspirin 81 mg p.o. daily. 2. Lipitor 10 mg p.o. daily. 3. Coreg 12.5 mg p.o. b.i.d. 4. Vitamin D 1000 units daily. 5. Plavix 75 mg p.o. daily. 6. Lasix 40 mg p.o. b.i.d. 7. Levemir 15 units daily, 35 units at bedtime. 8. Niferex 150 mg p.o. daily. 9. Robaxin 500 mg p.o. every 6 hours as needed for muscle spasms,     dispense of 60 tablets. 10.Multivitamin daily. 11.Oxycodone immediate release 5 to 10 mg every 3 hours as needed for     pain, dispense of 90 tablets. 12.Protonix 80 mg daily. 13.Potassium chloride 20 mEq p.o. b.i.d. 14.Lyrica 75 mg p.o. daily. 15.Aldactone 12.5 mg p.o. daily. 16.Vitamin D 1000 units p.o. daily. 17.Coumadin  18.Bactrim DS BID X5 days   SPECIAL INSTRUCTIONS:  Follow up with Dr. Alysia Penna at the [facility] as needed; Dr. Erlinda Hong, Orthopedic Service, call for appointment; Dr. Inda Merlin, medical management.  A home health nurse had been arranged for Coumadin therapy 01/17/2015 with results to [facility] at [phone], fax [phone].     Lauraine Rinne, P.A.   ______________________________ [name], M.D.    DA/MEDQ  D:  01/11/2015  T:  01/12/2015  Job:  [id number]  cc:   Frankey Shown, M.D. [name], M.D. [name], M.D.

## 2015-01-12 NOTE — Progress Notes (Signed)
ANTICOAGULATION CONSULT NOTE - Follow Up Consult  Pharmacy Consult for Coumadin Indication: atrial fibrillation  No Known Allergies  Patient Measurements: Height: 5\' 9"  (175.3 cm) Weight: 185 lb 10 oz (84.2 kg) IBW/kg (Calculated) : 70.7 Heparin Dosing Weight:   Vital Signs: Temp: 98.3 F (36.8 C) (05/20 0530) Temp Source: Oral (05/20 0530) BP: 120/64 mmHg (05/20 0800) Pulse Rate: 85 (05/20 0800)  Labs:  Recent Labs  01/10/15 0636 01/11/15 0730 01/12/15 0550  LABPROT 24.7* 27.6* 29.2*  INR 2.21* 2.55* 2.82*    Estimated Creatinine Clearance: 48.1 mL/min (by C-G formula based on Cr of 1.41).   Medications:  Scheduled:  . aspirin  81 mg Oral Daily  . atorvastatin  10 mg Oral Daily  . carvedilol  12.5 mg Oral BID WC  . cephALEXin  250 mg Oral 3 times per day  . cholecalciferol  1,000 Units Oral Daily  . clopidogrel  75 mg Oral Q breakfast  . furosemide  40 mg Oral BID  . insulin aspart  0-15 Units Subcutaneous TID WC  . insulin detemir  25 Units Subcutaneous Daily  . insulin detemir  35 Units Subcutaneous QHS  . iron polysaccharides  150 mg Oral Daily  . multivitamin with minerals  1 tablet Oral Daily  . pantoprazole  80 mg Oral Q1200  . potassium chloride SA  20 mEq Oral BID  . pregabalin  75 mg Oral BID  . senna-docusate  1 tablet Oral BID  . spironolactone  12.5 mg Oral QHS  . vitamin E  1,000 Units Oral Daily  . warfarin  10 mg Oral Once per day on Mon Thu Sat  . warfarin  15 mg Oral Once per day on Sun Tue Wed Fri  . Warfarin - Pharmacist Dosing Inpatient   Does not apply q1800    Assessment: 69yo male with AFib, continuing on home dose of Coumadin 15mg  daily except 10mg  MonThurSat.  INR has trended up to 2.82, but did receive lower dose yesterday.  No bleeding noted.  Goal of Therapy:  INR 2-3 Monitor platelets by anticoagulation protocol: Yes   Plan:  Continue current dose F/U INR in AM, if continue trending up may need to adjust  dosing  Gracy Bruins, PharmD Clinical Pharmacist [facility]

## 2015-01-12 NOTE — Progress Notes (Signed)
Physical Therapy Session Note  Patient Details  Name: [name] [name] MRN: [id number] Date of Birth: [birth year]  Today's Date: 01/12/2015 PT Individual Time: 0830-0930 PT Individual Time Calculation (min): 60 min   Short Term Goals: Week 1:  PT Short Term Goal 1 (Week 1): =LTG's due to ELOS  Skilled Therapeutic Interventions/Progress Updates:   Pt received lying in bed agreeable to therapy, requesting to get washed up prior to leaving room as brief had been soiled.  Had pt sit at EOB with bed flat and without rails to further challenge postural control and dynamic sitting balance.  Pt able to perform lateral leans at S level however requires cues for maintaining forward lean with lateral lean to prevent LOB posteriorly.  Pt able to wash UE's, front of trunk, LEs and front/back peri area during session as well as don brief and clothing prior to leaving room at S level.  Transferred bed>w/c and w/c<>mat at S level with assist to steady w/c and continued cues for forward weight shift onto RLE for better buttocks clearance.  Pt able to brush teeth and comb hair at w/c level at sink at mod I level.  Pt propelled to/from therapy gym x 100' at S level.  Once on therapy mat, continue to work on increased forward weight shift with bimanual task to fully rely on RLE to maintain balance.  Provided facilitation at pelvis for increased anterior pelvic tilt prior to forward lean at hips.  Requires light min A to complete several tasks with focus on maintaining forward trunk lean and weight shift during task.  Pt transferred back to w/c at S level with continued cues as above.  Attempted to have pt use arm chair in front of him for forward weight shift, however he was still unable to lift onto RLE.  Pt assisted back to room and left in w/c with all needs in reach.  Note that he was able to tolerate adjustments to prosthesis well during session.    Therapy Documentation Precautions:  Precautions Precautions:  Fall Precaution Comments: hx of dementia, R BKA as well Restrictions Weight Bearing Restrictions: Yes LLE Weight Bearing: Non weight bearing   Vital Signs: Therapy Vitals Temp: 98.3 F (36.8 C) Temp Source: Oral Pulse Rate: 85 Resp: 18 BP: 120/64 mmHg Patient Position (if appropriate): Lying Oxygen Therapy SpO2: 100 % O2 Device: Not Delivered Pain: Pain Assessment Pain Assessment: No/denies pain    See FIM for current functional status  Therapy/Group: Individual Therapy  Denice Bors 01/12/2015, 8:45 AM

## 2015-01-13 ENCOUNTER — Inpatient Hospital Stay (HOSPITAL_COMMUNITY): Payer: Medicare Other | Admitting: Physical Therapy

## 2015-01-13 ENCOUNTER — Encounter (HOSPITAL_COMMUNITY): Payer: Medicare Other | Admitting: Occupational Therapy

## 2015-01-13 ENCOUNTER — Inpatient Hospital Stay (HOSPITAL_COMMUNITY): Payer: Medicare Other | Admitting: Occupational Therapy

## 2015-01-13 LAB — URINE CULTURE: Colony Count: >=100000

## 2015-01-13 LAB — PROTIME-INR
INR: 3.13 — ABNORMAL HIGH (ref 0.00–1.49)
Prothrombin Time: 31.6 seconds — ABNORMAL HIGH (ref 11.6–15.2)

## 2015-01-13 LAB — GLUCOSE, CAPILLARY
GLUCOSE-CAPILLARY: 180 mg/dL — AB (ref 65–99)
GLUCOSE-CAPILLARY: 190 mg/dL — AB (ref 65–99)
GLUCOSE-CAPILLARY: 136 mg/dL — AB (ref 65–99)
Glucose-Capillary: 154 mg/dL — ABNORMAL HIGH (ref 65–99)

## 2015-01-13 MED ORDER — INSULIN DETEMIR 100 UNIT/ML ~~LOC~~ SOLN
40.0000 [IU] | Freq: Every day | SUBCUTANEOUS | Status: DC
  Administered 2015-01-13 – 2015-01-14 (×2): 40 [IU] via SUBCUTANEOUS
  Filled 2015-01-13 (×3): qty 0.4

## 2015-01-13 MED ORDER — WARFARIN SODIUM 5 MG PO TABS
5.0000 mg | ORAL_TABLET | Freq: Once | ORAL | Status: AC
  Administered 2015-01-13: 5 mg via ORAL
  Filled 2015-01-13: qty 1

## 2015-01-13 MED ORDER — INSULIN DETEMIR 100 UNIT/ML ~~LOC~~ SOLN
30.0000 [IU] | Freq: Every day | SUBCUTANEOUS | Status: DC
  Administered 2015-01-14 – 2015-01-15 (×2): 30 [IU] via SUBCUTANEOUS
  Filled 2015-01-13 (×2): qty 0.3

## 2015-01-13 NOTE — Progress Notes (Signed)
Physical Therapy Session Note  Patient Details  Name: [name] [name] MRN: [id number] Date of Birth: 03-28-1946  Today's Date: 01/13/2015 PT Individual Time: Treatment Session 1: 1000-1100 Treatment Session 2: 1130-1200 Treatment Session 1: 60 min Treatment Session 2: 30 min   Short Term Goals: Week 1:  PT Short Term Goal 1 (Week 1): =LTG's due to ELOS  Skilled Therapeutic Interventions/Progress Updates:    Treatment Session 1: Therapeutic Activity: Pt received sitting up in w/c, agreeable to PT. PT educates pt re: pressure reliefs while sitting up in w/c, explaining he should do chair push-ups every 30 minutes when sitting up (pt reports sometimes he does, sometimes he doesn't). PT explains these push ups will prevent a bottom sore and help his arms get strong so he can scoot more effectively. PT instructs pt in 3 x 10 sets of w/c push-ups - focus on hand/foot placement in order to achieve better forward trunk lean. PT instructs pt in scoot transfer w/c to/from bed without slideboard - focus on forward trunk lean and bottom clearance, req min A for forward trunk lean, but overall mod A to achieve a full bottom clearance squat-pivot transfer, bottom continues to drag with scoots due to pt's low activity tolerance and hand placement preventing bottom from translating entire way onto bed: x 3 reps - focus on head-hips relationship   Therapeutic Exercise:  PT instructs pt in B UE strengthening exercises on cable column: Lat pull down 20# then 30#, rows with 20# then 15# (PT stabilizes pt's w/c to prevent forward skidding during this exercise), and triceps extension with 15# then 10# : 3 x 10 reps each exercise  Treatment Session 2: Community Integration: Pt received up in w/c, agreeable to w/c propulsion in community environment. PT instructs pt in w/c navigation techniques, req set-up assist to hold the elevator open while pt enters/leaves it, and pt req verbal cues for steep ramp  management. Pt pushes on uneven surfaces including tile, laminate, carpet, brick, and concrete.   Pt's cognitive deficits make new learning difficult and he has very strongly entrenched patterns of movement present, specifically scooting from bed to/from w/c without forward trunk weight shift, which causes him to drag his bottom. Pt may benefit from practicing scoot transfer(s) with slideboard in order to reduce risk of shearing injury to bottom. Continue per PT POC.   Therapy Documentation Precautions:  Precautions Precautions: Fall Precaution Comments: hx of dementia, R BKA as well Restrictions Weight Bearing Restrictions: Yes LLE Weight Bearing: Non weight bearing Pain: Pain Assessment Pain Assessment: 0-10 Pain Score: 4  Pain Type: Surgical pain Pain Location: Leg Pain Orientation: Left;Distal Pain Descriptors / Indicators: Aching Pain Onset: On-going Pain Intervention(s): Rest;Emotional support Multiple Pain Sites: No Treatment Session 2: Pt denies pain.    See FIM for current functional status  Therapy/Group: Individual Therapy  Vietta Bonifield M 01/13/2015, 7:59 AM

## 2015-01-13 NOTE — Progress Notes (Signed)
Occupational Therapy Session Note  Patient Details  Name: [name] [name] MRN: [id number] Date of Birth: Jan 19, 1946  Today's Date: 01/13/2015 OT Individual Time: 0800-0900 and 1400-1444 OT Individual Time Calculation (min): 60 min and 44 min   Short Term Goals: Week 1:  OT Short Term Goal 1 (Week 1): STG=LTG  Skilled Therapeutic Interventions/Progress Updates:  Session 1: Upon entering the room, pt supine in bed with 2/10 c/o pain in L stump this session. Supine >sit from flat bed with supervision. Pt seated on EOB for bathing and dressing tasks. Pt required steady assist during lateral leans for clothing management and when washing buttocks. Pt performed scoot transfer bed >wheelchair with therapist steadying wheelchair for safety. Pt locking breaks and directing therapist on set up with increased time for steps. Pt performed grooming including shaving face while seated in wheelchair at sink side. Pt seated in wheelchair with call bell and all needed items within reach upon exiting the room.   Session 2: Upon entering the room, pt seated in wheelchair with no c/o pain this session. Pt performing 3 sets of 10 B UE strengthening exercises with 4 lbs dowel rod. Pt performed chest press, forwards rows, backward rows, and straight arm raises with rest breaks between sets secondary to muscle fatigue. Pt remained seated in wheelchair at end of session with call bell and all needed items within reach upon exiting the room.   Therapy Documentation Precautions:  Precautions Precautions: Fall Precaution Comments: hx of dementia, R BKA as well Restrictions Weight Bearing Restrictions: Yes LLE Weight Bearing: Non weight bearing General:   Vital Signs: Therapy Vitals Temp: 98.2 F (36.8 C) Temp Source: Oral Pulse Rate: 91 Resp: 16 BP: 125/68 mmHg Patient Position (if appropriate): Lying Oxygen Therapy SpO2: 99 % O2 Device: Not Delivered Pain: Pain Assessment Pain Score: 0-No  pain ADL: ADL ADL Comments: see FIM  See FIM for current functional status  Therapy/Group: Individual Therapy  [name] 01/13/2015, 8:25 AM

## 2015-01-13 NOTE — Progress Notes (Signed)
[name] [name] is a 69 y.o. male [birth year] [id number]  Subjective: No new complaints. No new problems. Slept well. Feeling OK. No pain  Objective: Vital signs in last 24 hours: Temp:  [97.6 F (36.4 C)-98.2 F (36.8 C)] 98.2 F (36.8 C) (05/21 0500) Pulse Rate:  [76-91] 91 (05/21 0500) Resp:  [16-18] 16 (05/21 0500) BP: (107-125)/(56-68) 125/68 mmHg (05/21 0500) SpO2:  [96 %-99 %] 99 % (05/21 0500) Weight change:  Last BM Date: 01/12/15  Intake/Output from previous day: 05/20 0701 - 05/21 0700 In: 960 [P.O.:960] Out: 2050 [Urine:2050]  Physical Exam General: No apparent distress  In BS WC, on phone - very pleasant  Lungs: Normal effort. Lungs clear to auscultation, no crackles or wheezes. Cardiovascular: Regular rate and rhythm Musculoskeletal:  S/p B  BKA Wounds:  Clean, dry, intact. No signs of infection.  Lab Results: BMET    Component Value Date/Time   NA 136 01/09/2015 0540   K 4.2 01/09/2015 0540   CL 100* 01/09/2015 0540   CO2 26 01/09/2015 0540   GLUCOSE 260* 01/09/2015 0540   BUN 22* 01/09/2015 0540   CREATININE 1.41* 01/09/2015 0540   CREATININE 0.98 12/27/2013 0920   CALCIUM 8.7* 01/09/2015 0540   GFRNONAA 49* 01/09/2015 0540   GFRAA 56* 01/09/2015 0540   CBC    Component Value Date/Time   WBC 5.7 01/09/2015 0540   RBC 4.54 01/09/2015 0540   HGB 11.1* 01/09/2015 0540   HCT 33.4* 01/09/2015 0540   PLT 237 01/09/2015 0540   MCV 73.6* 01/09/2015 0540   MCH 24.4* 01/09/2015 0540   MCHC 33.2 01/09/2015 0540   RDW 15.7* 01/09/2015 0540   LYMPHSABS 0.9 01/09/2015 0540   MONOABS 0.4 01/09/2015 0540   EOSABS 0.3 01/09/2015 0540   BASOSABS 0.0 01/09/2015 0540   CBG's (last 3):   Recent Labs  01/12/15 2112 01/12/15 2324 01/13/15 0644  GLUCAP 406* 293* 180*   LFT's Lab Results  Component Value Date   ALT 16* 01/09/2015   AST 18 01/09/2015   ALKPHOS 73 01/09/2015   BILITOT 0.3 01/09/2015    Studies/Results: No results  found.  Medications:  I have reviewed the patient's current medications. Scheduled Medications: . aspirin  81 mg Oral Daily  . atorvastatin  10 mg Oral Daily  . carvedilol  12.5 mg Oral BID WC  . cephALEXin  250 mg Oral 3 times per day  . cholecalciferol  1,000 Units Oral Daily  . clopidogrel  75 mg Oral Q breakfast  . furosemide  40 mg Oral BID  . insulin aspart  0-20 Units Subcutaneous TID WC  . insulin aspart  0-5 Units Subcutaneous QHS  . insulin detemir  25 Units Subcutaneous Daily  . insulin detemir  35 Units Subcutaneous QHS  . iron polysaccharides  150 mg Oral Daily  . multivitamin with minerals  1 tablet Oral Daily  . pantoprazole  80 mg Oral Q1200  . potassium chloride SA  20 mEq Oral BID  . pregabalin  75 mg Oral BID  . senna-docusate  1 tablet Oral BID  . spironolactone  12.5 mg Oral QHS  . vitamin E  1,000 Units Oral Daily  . warfarin  5 mg Oral ONCE-1800  . Warfarin - Pharmacist Dosing Inpatient   Does not apply q1800   PRN Medications: acetaminophen **OR** acetaminophen, methocarbamol, ondansetron **OR** ondansetron (ZOFRAN) IV, oxyCODONE, polyethylene glycol, sorbitol, sorbitol  Assessment/Plan: Principal Problem:   Amputation of right lower extremity below knee Active Problems:  BiV ICD (BS).  ICD in '05, BiV ICD 11/10   PAD (peripheral artery disease)   S/P bilateral BKA (below knee amputation)   Diabetes mellitus with neuropathy  1. Functional deficits secondary to left BKA revision, history of right BKA with bilateral prosthesis 2. DVT Prophylaxis/Anticoagulation: Chronic Coumadin therapy for PAF, ASA, Plavix 3. Pain Management: Lyrica 75 mg twice a day, Oxycodone and Robaxin as needed. Monitor with increased mobility 4. Acute blood loss anemia. Follow-up CBC improved 5/17. continue Niferex 5. Neuropsych: This patient is capable of making decisions on his own behalf. 6. Skin/Wound Care: Routine skin checks 7. Fluids/Electrolytes/Nutrition: Routine I  nose with follow-up chemistries 8. Diabetes mellitus with peripheral neuropathy, uncontrolled. Check blood sugars before meals and at bedtime. Levemir home dose is 25 Units in am and 35 Units in pm, adjust prn and continue SSI 9. Nonischemic cardiomyopathy/PAF/ICD. Coreg 12.5 mg twice a day, Aldactone 12.5 mg daily at bedtime. Monitor with increased mobility 10. Diastolic congestive heart failure. Lasix 40 mg twice daily. Monitor for any signs of fluid overload 11. Hyperlipidemia. Lipitor 12. GERD. Protonix 13. UTI. Ucx 5/19 >100K GNR - on keflex pedning cx  Length of stay, days: 5   Tyreesha Maharaj A. Asa Lente, MD 01/13/2015, 10:14 AM

## 2015-01-13 NOTE — Progress Notes (Signed)
ANTICOAGULATION CONSULT NOTE - Follow Up Consult  Pharmacy Consult for Coumadin Indication: atrial fibrillation  No Known Allergies  Patient Measurements: Height: 5\' 9"  (175.3 cm) Weight: 185 lb 10 oz (84.2 kg) IBW/kg (Calculated) : 70.7  Vital Signs: Temp: 98.2 F (36.8 C) (05/21 0500) Temp Source: Oral (05/21 0500) BP: 125/68 mmHg (05/21 0500) Pulse Rate: 91 (05/21 0500)  Labs:  Recent Labs  01/11/15 0730 01/12/15 0550 01/13/15 0521  LABPROT 27.6* 29.2* 31.6*  INR 2.55* 2.82* 3.13*    Estimated Creatinine Clearance: 48.1 mL/min (by C-G formula based on Cr of 1.41).  Assessment: 69yo male with AFib continues on warfarin. INR has been trending up the last few days and today it is supratherapeutic at 3.13. No overt bleeding noted.   Goal of Therapy:  INR 2-3 Monitor platelets by anticoagulation protocol: Yes   Plan:  - Reduce coumadin to 5mg  PO x 1 tonight (50% of regular home dose) - F/u AM INR  Salome Arnt, PharmD, BCPS Pager # [phone] 01/13/2015 9:06 AM

## 2015-01-14 ENCOUNTER — Inpatient Hospital Stay (HOSPITAL_COMMUNITY): Payer: Medicare Other | Admitting: Physical Therapy

## 2015-01-14 ENCOUNTER — Inpatient Hospital Stay (HOSPITAL_COMMUNITY): Payer: Medicare Other | Admitting: Occupational Therapy

## 2015-01-14 ENCOUNTER — Encounter (HOSPITAL_COMMUNITY): Payer: Medicare Other | Admitting: Occupational Therapy

## 2015-01-14 DIAGNOSIS — E1040 Type 1 diabetes mellitus with diabetic neuropathy, unspecified: Secondary | ICD-10-CM

## 2015-01-14 LAB — GLUCOSE, CAPILLARY
GLUCOSE-CAPILLARY: 239 mg/dL — AB (ref 65–99)
Glucose-Capillary: 150 mg/dL — ABNORMAL HIGH (ref 65–99)
Glucose-Capillary: 182 mg/dL — ABNORMAL HIGH (ref 65–99)
Glucose-Capillary: 239 mg/dL — ABNORMAL HIGH (ref 65–99)

## 2015-01-14 LAB — PROTIME-INR
INR: 3.15 — AB (ref 0.00–1.49)
PROTHROMBIN TIME: 31.8 s — AB (ref 11.6–15.2)

## 2015-01-14 MED ORDER — FUROSEMIDE 40 MG PO TABS
40.0000 mg | ORAL_TABLET | Freq: Two times a day (BID) | ORAL | Status: DC
  Administered 2015-01-14 – 2015-01-15 (×3): 40 mg via ORAL
  Filled 2015-01-14 (×4): qty 1

## 2015-01-14 MED ORDER — SULFAMETHOXAZOLE-TRIMETHOPRIM 800-160 MG PO TABS
1.0000 | ORAL_TABLET | Freq: Two times a day (BID) | ORAL | Status: DC
  Administered 2015-01-14 – 2015-01-15 (×3): 1 via ORAL
  Filled 2015-01-14 (×5): qty 1

## 2015-01-14 MED ORDER — WARFARIN SODIUM 2.5 MG PO TABS
2.5000 mg | ORAL_TABLET | Freq: Once | ORAL | Status: AC
  Administered 2015-01-14: 2.5 mg via ORAL
  Filled 2015-01-14: qty 1

## 2015-01-14 NOTE — Progress Notes (Signed)
ANTICOAGULATION CONSULT NOTE - Follow Up Consult  Pharmacy Consult for Coumadin Indication: atrial fibrillation  No Known Allergies  Patient Measurements: Height: 5\' 9"  (175.3 cm) Weight: 185 lb 10 oz (84.2 kg) IBW/kg (Calculated) : 70.7  Vital Signs: Temp: 97.9 F (36.6 C) (05/22 0535) Temp Source: Oral (05/22 0535) BP: 104/70 mmHg (05/22 0535) Pulse Rate: 82 (05/22 0535)  Labs:  Recent Labs  01/12/15 0550 01/13/15 0521 01/14/15 0546  LABPROT 29.2* 31.6* 31.8*  INR 2.82* 3.13* 3.15*    Estimated Creatinine Clearance: 48.1 mL/min (by C-G formula based on Cr of 1.41).  Assessment: 69yo male with AFib continues on warfarin. INR has stabilized and remains slightly above goal at 3.15. No new CBC today, no bleeding noted. Of note, bactrim has been started for UTI treatment. This is a significant interaction with warfarin and will likely increase the INR.   Goal of Therapy:  INR 2-3 Monitor platelets by anticoagulation protocol: Yes   Plan:  - Reduce coumadin further to 2.5mg  PO x 1 tonight due to bactrim starting - F/u AM INR  Salome Arnt, PharmD, BCPS Pager # [phone] 01/14/2015 9:52 AM

## 2015-01-14 NOTE — Progress Notes (Signed)
Physical Therapy Session Note  Patient Details  Name: [name] [name] MRN: [id number] Date of Birth: 1946/07/07  Today's Date: 01/14/2015 PT Individual Time: 1000-1100 PT Individual Time Calculation (min): 60 min   Short Term Goals: Week 1:  PT Short Term Goal 1 (Week 1): =LTG's due to ELOS  Skilled Therapeutic Interventions/Progress Updates:  Pt was seen bedside in the am. Pt propelled w/c about 150 feet with B UEs and S. Performed ARROM to AROM L knee flex-ext, 3 sets x 10 reps each. Performed 3 sets x 10 reps each arm chair push ups. Pt performed w/c to mat, mat to w/c transfers x 3 with S and verbal cues. AS pt fatigues decreased ability to clear buttocks during transfer despite cueing. Attempted sliding board transfer, initially required min A to place board and S with cues for transfer. Pt progressed to S with verbal cues for sliding board transfers. Pt returned to room and left sitting up in w/c with call bell within reach.   Therapy Documentation Precautions:  Precautions Precautions: Fall Precaution Comments: hx of dementia, R BKA as well Restrictions Weight Bearing Restrictions: Yes LLE Weight Bearing: Non weight bearing General:   Pain: No c/o pain.   See FIM for current functional status  Therapy/Group: Individual Therapy  Dub Amis 01/14/2015, 12:43 PM

## 2015-01-14 NOTE — Progress Notes (Signed)
[name] [name] is a 69 y.o. male 1945-12-20 [id number]  Subjective: No new complaints. Poor sleep due to freq urination but no other new problems. Feeling OK. No pain  Objective: Vital signs in last 24 hours: Temp:  [97.5 F (36.4 C)-97.9 F (36.6 C)] 97.9 F (36.6 C) (05/22 0535) Pulse Rate:  [72-82] 82 (05/22 0535) Resp:  [18] 18 (05/22 0535) BP: (100-119)/(62-70) 104/70 mmHg (05/22 0535) SpO2:  [93 %-100 %] 93 % (05/22 0535) Weight change:  Last BM Date: 01/13/15  Intake/Output from previous day: 05/21 0701 - 05/22 0700 In: 1320 [P.O.:1320] Out: 1825 [Urine:1825]  Physical Exam General: No apparent distress  In BS WC, on phone - very pleasant  Lungs: Normal effort. Lungs clear to auscultation, no crackles or wheezes. Cardiovascular: Regular rate and rhythm Musculoskeletal:  S/p B  BKA Wounds:  Clean, dry, intact. No signs of infection.  Lab Results: BMET    Component Value Date/Time   NA 136 01/09/2015 0540   K 4.2 01/09/2015 0540   CL 100* 01/09/2015 0540   CO2 26 01/09/2015 0540   GLUCOSE 260* 01/09/2015 0540   BUN 22* 01/09/2015 0540   CREATININE 1.41* 01/09/2015 0540   CREATININE 0.98 12/27/2013 0920   CALCIUM 8.7* 01/09/2015 0540   GFRNONAA 49* 01/09/2015 0540   GFRAA 56* 01/09/2015 0540   CBC    Component Value Date/Time   WBC 5.7 01/09/2015 0540   RBC 4.54 01/09/2015 0540   HGB 11.1* 01/09/2015 0540   HCT 33.4* 01/09/2015 0540   PLT 237 01/09/2015 0540   MCV 73.6* 01/09/2015 0540   MCH 24.4* 01/09/2015 0540   MCHC 33.2 01/09/2015 0540   RDW 15.7* 01/09/2015 0540   LYMPHSABS 0.9 01/09/2015 0540   MONOABS 0.4 01/09/2015 0540   EOSABS 0.3 01/09/2015 0540   BASOSABS 0.0 01/09/2015 0540   CBG's (last 3):    Recent Labs  01/13/15 1626 01/13/15 2054 01/14/15 0646  GLUCAP 136* 154* 150*   LFT's Lab Results  Component Value Date   ALT 16* 01/09/2015   AST 18 01/09/2015   ALKPHOS 73 01/09/2015   BILITOT 0.3 01/09/2015     Studies/Results: No results found.  Medications:  I have reviewed the patient's current medications. Scheduled Medications: . aspirin  81 mg Oral Daily  . atorvastatin  10 mg Oral Daily  . carvedilol  12.5 mg Oral BID WC  . cephALEXin  250 mg Oral 3 times per day  . cholecalciferol  1,000 Units Oral Daily  . clopidogrel  75 mg Oral Q breakfast  . furosemide  40 mg Oral BID  . insulin aspart  0-20 Units Subcutaneous TID WC  . insulin aspart  0-5 Units Subcutaneous QHS  . insulin detemir  30 Units Subcutaneous Daily  . insulin detemir  40 Units Subcutaneous QHS  . iron polysaccharides  150 mg Oral Daily  . multivitamin with minerals  1 tablet Oral Daily  . pantoprazole  80 mg Oral Q1200  . potassium chloride SA  20 mEq Oral BID  . pregabalin  75 mg Oral BID  . senna-docusate  1 tablet Oral BID  . spironolactone  12.5 mg Oral QHS  . vitamin E  1,000 Units Oral Daily  . Warfarin - Pharmacist Dosing Inpatient   Does not apply q1800   PRN Medications: acetaminophen **OR** acetaminophen, methocarbamol, ondansetron **OR** ondansetron (ZOFRAN) IV, oxyCODONE, polyethylene glycol, sorbitol, sorbitol  Assessment/Plan: Principal Problem:   Amputation of right lower extremity below knee Active Problems:  BiV ICD (BS).  ICD in '05, BiV ICD 11/10   PAD (peripheral artery disease)   S/P bilateral BKA (below knee amputation)   Diabetes mellitus with neuropathy  1. Functional deficits secondary to left BKA revision, history of right BKA with bilateral prosthesis 2. DVT Prophylaxis/Anticoagulation: Chronic Coumadin therapy for PAF, ASA, Plavix 3. Pain Management: Lyrica 75 mg twice a day, Oxycodone and Robaxin as needed. Monitor with increased mobility 4. Acute blood loss anemia. Follow-up CBC improved 5/17. continue Niferex 5. Neuropsych: This patient is capable of making decisions on his own behalf. 6. Skin/Wound Care: Routine skin checks -  7. Fluids/Electrolytes/Nutrition:  Routine I nose with follow-up chemistries 8. Diabetes mellitus with peripheral neuropathy, uncontrolled. Check blood sugars before meals and at bedtime. Levemir home dose is 25 Units in am and 35 Units in pm, adjust prn and continue SSI 9. Nonischemic cardiomyopathy/PAF/ICD. Coreg 12.5 mg twice a day, Aldactone 12.5 mg daily at bedtime. Monitor with increased mobility 10. Diastolic congestive heart failure. Lasix 40 mg twice daily. Monitor for any signs of fluid overload 11. Hyperlipidemia. Lipitor 12. GERD. Protonix 13. UTI. Ucx 5/19 >100K GNR. Now Proteus mirabilis - change keflex to septra per sensitivity and tx 7 d  Length of stay, days: 6   Corrin Hingle A. Asa Lente, MD 01/14/2015, 9:20 AM

## 2015-01-14 NOTE — Progress Notes (Signed)
Occupational Therapy Session Note  Patient Details  Name: GENEROSO CROPPER MRN: [id number] Date of Birth: 1945/12/21  Today's Date: 01/14/2015 OT Individual Time: 0730-0830 OT Individual Time Calculation (min): 60 min    Short Term Goals: Week 1:  OT Short Term Goal 1 (Week 1): STG=LTG  Skilled Therapeutic Interventions/Progress Updates:    1) Engaged in ADL retraining with focus on lateral leans while completing self-care tasks.  Pt completed bathing and dressing tasks seated at EOB with setup assist to obtain all necessary items. Pt completed lateral leans without assist this session, demonstrating ability to pull up brief and shorts with multiple leans and increased time.  Squat/scoot transfer bed > w/c with therapist steady w/c for safety.  Min cues for recall to lock and unlock brakes and repositioning arm rest post transfer.  Grooming completed seated at sink in w/c.  Pt left in w/c with all needs in reach.  2) Engaged in therapeutic activity with focus on functional transfers and BUE strengthening to increase participation and clearance of buttocks during transfers.  PT spoke with therapist prior to session and reports that as pt fatigues he has increased difficulty clearing buttocks with transfers therefore they utilized slide board.  Pt requested to attempt transfers again without slide board, demonstrated good clearance at beginning of session but having difficulty when transferring back to w/c.  Utilized 4# medicine ball with chest presses and PNF pattern reaching for further BUE strengthening.  Pt with questions regarding showering and reports that he think a tub bench would work for him at home.  Discussed bathroom setup and unsure if this would be best, as he has typically been sponge bathing and unsure if he could access tub from his w/c.  Completed tub/shower transfer w/c <> tub bench with close supervision in ADL apt, recommend that Anthony problem solve transfer in his home setup and  then recommend equipment as needed.  Therapy Documentation Precautions:  Precautions Precautions: Fall Precaution Comments: hx of dementia, R BKA as well Restrictions Weight Bearing Restrictions: Yes LLE Weight Bearing: Non weight bearing General:   Vital Signs: Therapy Vitals Temp: 97.9 F (36.6 C) Temp Source: Oral Pulse Rate: 82 Resp: 18 BP: 104/70 mmHg Patient Position (if appropriate): Lying Oxygen Therapy SpO2: 93 % O2 Device: Not Delivered Pain: Pain Assessment Pain Assessment: 0-10 Pain Score: 5  Pain Type: Surgical pain Pain Location: Leg Pain Orientation: Left ADL: ADL ADL Comments: see FIM  See FIM for current functional status  Therapy/Group: Individual Therapy  Simonne Come 01/14/2015, 8:26 AM

## 2015-01-14 NOTE — Progress Notes (Signed)
Physical Therapy Session Note  Patient Details  Name: [name] [name] MRN: [id number] Date of Birth: 11-Dec-1945  Today's Date: 01/14/2015 PT Individual Time: 1300-1330 PT Individual Time Calculation (min): 30 min   Short Term Goals: Week 1:  PT Short Term Goal 1 (Week 1): =LTG's due to ELOS  Skilled Therapeutic Interventions/Progress Updates:  Pt was seen bedside in the pm. Pt propelledw/c 150 feet x 2 with B UEs and S. Pt preformed sliding board transfers x 2 w/c to mat, mat to w/c with S and verbal cues for safety and technique. Following treatment, pt returned to room and left sitting up in w/c with call bell within reach.   Therapy Documentation Precautions:  Precautions Precautions: Fall Precaution Comments: hx of dementia, R BKA as well Restrictions Weight Bearing Restrictions: Yes LLE Weight Bearing: Non weight bearing General:   Pain: No c/o pain at beginning of treatment, at end of treatment pt c/o 7/10 pain L BKA, nursing notified.   See FIM for current functional status  Therapy/Group: Individual Therapy  Dub Amis 01/14/2015, 3:31 PM

## 2015-01-15 ENCOUNTER — Inpatient Hospital Stay (HOSPITAL_COMMUNITY): Payer: Medicare Other

## 2015-01-15 ENCOUNTER — Encounter (HOSPITAL_COMMUNITY): Payer: Medicare Other | Admitting: Occupational Therapy

## 2015-01-15 ENCOUNTER — Ambulatory Visit (HOSPITAL_COMMUNITY): Payer: Medicare Other | Admitting: Rehabilitation

## 2015-01-15 LAB — BASIC METABOLIC PANEL
Anion gap: 8 (ref 5–15)
BUN: 20 mg/dL (ref 6–20)
CHLORIDE: 104 mmol/L (ref 101–111)
CO2: 26 mmol/L (ref 22–32)
Calcium: 8.4 mg/dL — ABNORMAL LOW (ref 8.9–10.3)
Creatinine, Ser: 1.29 mg/dL — ABNORMAL HIGH (ref 0.61–1.24)
GFR calc Af Amer: >60 mL/min (ref >60)
GFR, EST NON AFRICAN AMERICAN: 54 mL/min — AB (ref >60)
GLUCOSE: 177 mg/dL — AB (ref 65–99)
Potassium: 3.9 mmol/L (ref 3.5–5.1)
Sodium: 138 mmol/L (ref 135–145)

## 2015-01-15 LAB — CBC
HEMATOCRIT: 33.8 % — AB (ref 39.0–52.0)
HEMOGLOBIN: 11.2 g/dL — AB (ref 13.0–17.0)
MCH: 24.3 pg — ABNORMAL LOW (ref 26.0–34.0)
MCHC: 33.1 g/dL (ref 30.0–36.0)
MCV: 73.5 fL — ABNORMAL LOW (ref 78.0–100.0)
PLATELETS: 294 10*3/uL (ref 150–400)
RBC: 4.6 MIL/uL (ref 4.22–5.81)
RDW: 15.8 % — ABNORMAL HIGH (ref 11.5–15.5)
WBC: 4.2 10*3/uL (ref 4.0–10.5)

## 2015-01-15 LAB — GLUCOSE, CAPILLARY
Glucose-Capillary: 159 mg/dL — ABNORMAL HIGH (ref 65–99)
Glucose-Capillary: 76 mg/dL (ref 65–99)

## 2015-01-15 LAB — PROTIME-INR
INR: 2.97 — ABNORMAL HIGH (ref 0.00–1.49)
PROTHROMBIN TIME: 30.4 s — AB (ref 11.6–15.2)

## 2015-01-15 MED ORDER — METHOCARBAMOL 500 MG PO TABS: 500.0000 mg | ORAL_TABLET | Freq: Four times a day (QID) | ORAL | Status: DC | PRN

## 2015-01-15 MED ORDER — CARVEDILOL 12.5 MG PO TABS: 12.5000 mg | ORAL_TABLET | Freq: Two times a day (BID) | ORAL | Status: DC

## 2015-01-15 MED ORDER — PREGABALIN 75 MG PO CAPS: 75.0000 mg | ORAL_CAPSULE | Freq: Two times a day (BID) | ORAL | Status: AC

## 2015-01-15 MED ORDER — CLOPIDOGREL BISULFATE 75 MG PO TABS: 75.0000 mg | ORAL_TABLET | Freq: Every day | ORAL | Status: DC

## 2015-01-15 MED ORDER — WARFARIN SODIUM 2.5 MG PO TABS
2.5000 mg | ORAL_TABLET | Freq: Every day | ORAL | Status: DC
  Administered 2015-01-15: 2.5 mg via ORAL
  Filled 2015-01-15: qty 1

## 2015-01-15 MED ORDER — OXYCODONE HCL 5 MG PO TABS: 5.0000 mg | ORAL_TABLET | ORAL | Status: DC | PRN

## 2015-01-15 MED ORDER — SPIRONOLACTONE 25 MG PO TABS: 12.5000 mg | ORAL_TABLET | Freq: Every day | ORAL | Status: DC

## 2015-01-15 MED ORDER — ATORVASTATIN CALCIUM 10 MG PO TABS: 10.0000 mg | ORAL_TABLET | Freq: Every day | ORAL | Status: DC

## 2015-01-15 MED ORDER — VITAMIN D 1000 UNITS PO TABS: 1000.0000 [IU] | ORAL_TABLET | Freq: Every day | ORAL | Status: DC

## 2015-01-15 MED ORDER — FUROSEMIDE 40 MG PO TABS: 40.0000 mg | ORAL_TABLET | Freq: Two times a day (BID) | ORAL | Status: DC

## 2015-01-15 MED ORDER — SULFAMETHOXAZOLE-TRIMETHOPRIM 800-160 MG PO TABS: 1.0000 | ORAL_TABLET | Freq: Two times a day (BID) | ORAL | Status: DC

## 2015-01-15 MED ORDER — POLYSACCHARIDE IRON COMPLEX 150 MG PO CAPS: 150.0000 mg | ORAL_CAPSULE | Freq: Every day | ORAL | Status: DC

## 2015-01-15 MED ORDER — INSULIN DETEMIR 100 UNIT/ML ~~LOC~~ SOLN: SUBCUTANEOUS | Status: DC

## 2015-01-15 MED ORDER — WARFARIN SODIUM 2.5 MG PO TABS: 2.5000 mg | ORAL_TABLET | Freq: Every day | ORAL | Status: DC

## 2015-01-15 MED ORDER — ESOMEPRAZOLE MAGNESIUM 40 MG PO CPDR: 40.0000 mg | DELAYED_RELEASE_CAPSULE | Freq: Every day | ORAL | Status: DC | PRN

## 2015-01-15 NOTE — Progress Notes (Signed)
Social Work Discharge Note Discharge Note  The overall goal for the admission was met for:   Discharge location: Yes-HOME WITH WIFE WHO CAN ASSIST WITH HIS CARE  Length of Stay: Yes-8 DAYS  Discharge activity level: Yes-SUPERVISION LEVEL  Home/community participation: Yes  Services provided included: MD, RD, PT, OT, RN, CM, TR, Pharmacy and SW  Financial Services: Private Insurance: [facility]  Follow-up services arranged: Home Health: [facility]-PT,OT,RN and Patient/Family request agency HH: PREF USED THEM BEFORE, DME: NO NEEDS-HAS ALL NEEDED EQUIPMENT  Comments (or additional information):EDUCATION COMPLETED WITH WIFE AND SHE IS AWARE OF CARE NEEDS. AWARE OF PRIVATE PAY COST FOR DROP-ARM BEDSIDE COMMODE-$164.00 PLANS TO WAIT DUE TO COST. OT EDUCATED COMMODE TRANSFER WITH REGULAR BEDSIDE COMMODE  Patient/Family verbalized understanding of follow-up arrangements: Yes  Individual responsible for coordination of the follow-up plan: PATIENT & SHIRLEY-WIFE  Confirmed correct DME delivered: Elease Hashimoto 01/15/2015    Elease Hashimoto

## 2015-01-15 NOTE — Progress Notes (Signed)
ANTICOAGULATION CONSULT NOTE - Follow Up Consult  Pharmacy Consult for coumadin Indication: atrial fibrillation  No Known Allergies  Patient Measurements: Height: 5\' 9"  (175.3 cm) Weight: 185 lb 10 oz (84.2 kg) IBW/kg (Calculated) : 70.7 Heparin Dosing Weight:   Vital Signs: Temp: 98 F (36.7 C) (05/23 0532) Temp Source: Oral (05/23 0532) BP: 106/63 mmHg (05/23 0532) Pulse Rate: 69 (05/23 0532)  Labs:  Recent Labs  01/13/15 0521 01/14/15 0546 01/15/15 0615  HGB  --   --  11.2*  HCT  --   --  33.8*  PLT  --   --  294  LABPROT 31.6* 31.8* 30.4*  INR 3.13* 3.15* 2.97*  CREATININE  --   --  1.29*    Estimated Creatinine Clearance: 52.5 mL/min (by C-G formula based on Cr of 1.29).   Medications:  Scheduled:  . aspirin  81 mg Oral Daily  . atorvastatin  10 mg Oral Daily  . carvedilol  12.5 mg Oral BID WC  . cholecalciferol  1,000 Units Oral Daily  . clopidogrel  75 mg Oral Q breakfast  . furosemide  40 mg Oral BID  . insulin aspart  0-20 Units Subcutaneous TID WC  . insulin aspart  0-5 Units Subcutaneous QHS  . insulin detemir  30 Units Subcutaneous Daily  . insulin detemir  40 Units Subcutaneous QHS  . iron polysaccharides  150 mg Oral Daily  . multivitamin with minerals  1 tablet Oral Daily  . pantoprazole  80 mg Oral Q1200  . potassium chloride SA  20 mEq Oral BID  . pregabalin  75 mg Oral BID  . senna-docusate  1 tablet Oral BID  . spironolactone  12.5 mg Oral QHS  . sulfamethoxazole-trimethoprim  1 tablet Oral Q12H  . vitamin E  1,000 Units Oral Daily  . warfarin  2.5 mg Oral q1800  . Warfarin - Pharmacist Dosing Inpatient   Does not apply q1800   Infusions:    Assessment: 69 yo male with afib is currently on therapeutic coumadin. INR down to 2.97 from 3.15.  Patient is currently on bactrim which can affect the INR Goal of Therapy:  INR 2-3 Monitor platelets by anticoagulation protocol: Yes   Plan:  - Medical team has ordered coumadin 2.5 mg po  daily - would recommend to monitor INR closely since the introduction of bactrim - INR in am  Henry Demeritt, Tsz-Yin 01/15/2015,8:14 AM

## 2015-01-15 NOTE — Progress Notes (Signed)
Occupational Therapy Session Note  Patient Details  Name: [name] [name] MRN: [id number] Date of Birth: 09-28-1945  Today's Date: 01/15/2015 OT Individual Time: 1105-1205 OT Individual Time Calculation (min): 60 min    Short Term Goals: Week 1:  OT Short Term Goal 1 (Week 1): STG=LTG  Skilled Therapeutic Interventions/Progress Updates:    Engaged in hands on family education with pt and his wife in preparation to d/c home today.  Educated on squat/scoot transfers bed <> w/c <> drop arm BSC which pt completed with supervision.  Upon further discussion pt has basic 3 in 1 commode that wife reports "if you swing the arm away the leg moves too" and "we would wedge it into the w/c for stability".  Educated pt and wife that this is truly unsafe and is meant to remain intact with all 4 legs in contact with the floor.  Discussed with SWK who reports insurance does not cover drop arm BSC at this time and it would be $163.  Pt and wife unwilling to pay that, despite recommendation from therapist that this would be the safest method.  Educated pt ans pt's wife on A/P transfers with pt reporting unfamiliar with such and not wanting to attempt.  Spoke with PT who reports having completed A/P transfers during session last week with pt able to complete with supervision, she plans to reiterate during her session.  Also conducted slide board transfer from w/c to 3 in 1 with pt and wife able to return demonstrate with wife providing appropriate cues and recommend steady assist at trunk.  Recommend A/P transfers at this time or purchasing a drop arm BSC, but based on conversation pt and wife will most likely perform slide board transfers which they demonstrated safety however pt is still at a great falls risk.  Therapy Documentation Precautions:  Precautions Precautions: Fall Precaution Comments: hx of dementia, R BKA as well Restrictions Weight Bearing Restrictions: Yes LLE Weight Bearing: Non weight  bearing Pain: Pain Assessment Pain Assessment: 0-10 Pain Score: 4  Pain Type: Neuropathic pain Pain Location: Leg Pain Orientation: Left Pain Descriptors / Indicators: Aching Patients Stated Pain Goal: 2 Pain Intervention(s): Medication (See eMAR) ADL: ADL ADL Comments: see FIM  See FIM for current functional status  Therapy/Group: Individual Therapy  Simonne Come 01/15/2015, 12:15 PM

## 2015-01-15 NOTE — Progress Notes (Signed)
Discharged to home accompanied by wife. Discharge info and scripts given to wife, reviewed meds for tonight. No questions noted. Escorted out via w/c by NT. [name] [name]

## 2015-01-15 NOTE — Progress Notes (Signed)
Dressing changed. Incision c/d/i Stable for dc from ortho standpoint F/u in office in 1 week for suture removal  N. Eduard Roux, MD [place] 7:44 AM

## 2015-01-15 NOTE — Progress Notes (Addendum)
Physical Therapy Discharge Summary  Patient Details  Name: [name] [name] MRN: [id number] Date of Birth: Feb 19, 1946  Today's Date: 01/15/2015 PT Individual Time: 6073-7106 PT Individual Time Calculation (min): 45 min    Patient has met 8 of 9 long term goals due to improved activity tolerance, improved balance, increased strength and ability to compensate for deficits.  Patient to discharge at a wheelchair level Supervision.   Patient's care partner is independent to provide the necessary physical and cognitive assistance at discharge.  Reasons goals not met: Pt unable to meet furniture transfer goal as he requires min A on uneven surfaces due to inability to forward weight shift enough for full elevation of buttocks.    Recommendation:  Patient will benefit from ongoing skilled PT services in home health setting to continue to advance safe functional mobility, address ongoing impairments in decreased postural control, decreased ability to forward weight shift, decrease generalized strength, and minimize fall risk.  Equipment: No equipment provided  Reasons for discharge: treatment goals met and discharge from hospital  Patient/family agrees with progress made and goals achieved: Yes   PT Treatment/Intervention: Skilled session focused on grad day activities and ensuring pt met all goals for safe D/C home as well as hands on family training for car transfer with use of SB.  Performed all level transfers and car transfer at S level with and without use of SB.  Wife returned demonstration for car transfer with min cues to recall sequencing of removing w/c parts prior to locking and performing transfer.  Performed bed mobility at mod I level, w/c propulsion at mod I level, S community level for safety.  Also performed ramp at S level to simulate home entry.  Performed short distance gait at mod A level with +2A for w/c follow for safety.  Requires max A to stand due to inability to forward  weight shift due to high fear of falling.  Educated on importance of being able to forward weight shift in order to stand and ambulate and that would be extremely difficult if not impossible to perform without being able to do that.  Both verbalized understanding.  Pt self propelled back to room.  Wife called [employer] to ask about new liner.  PA wrote script so that wife could pick up on way home.  Pt left in room with all needs in reach.    Note also asked if pt and wife needed/wanted to perform AP transfer, however wife and pt stated that they felt comfortable with slideboard to bedside commode.  Provided max education on cuing pt for forward trunk lean and not attempting to use RW at this time.   PT Discharge Precautions/Restrictions Precautions Precautions: Fall Precaution Comments: hx of dementia, bilateral BKA Restrictions Weight Bearing Restrictions: Yes LLE Weight Bearing: Non weight bearing   Pain Pain Assessment Pain Assessment: No/denies pain Pain Score: 0-No pain    Cognition Overall Cognitive Status: History of cognitive impairments - at baseline (diagnosis of dementia, but able to recall education and faces from previous stay on rehab) Arousal/Alertness: Awake/alert Orientation Level: Oriented X4 Attention: Selective Selective Attention: Appears intact Memory: Impaired Memory Impairment: Decreased recall of new information Awareness: Appears intact Problem Solving: Impaired Problem Solving Impairment: Functional basic Safety/Judgment: Impaired Sensation Sensation Light Touch: Impaired Detail Light Touch Impaired Details: Impaired LLE;Impaired RLE Stereognosis: Not tested Hot/Cold: Not tested Proprioception: Appears Intact Coordination Gross Motor Movements are Fluid and Coordinated: Yes Fine Motor Movements are Fluid and Coordinated: Yes Motor  Motor  Motor: Other (comment) Motor - Discharge Observations: generalized weakness  Mobility Bed Mobility Bed  Mobility: Supine to Sit;Sit to Supine Supine to Sit: 6: Modified independent (Device/Increase time) Sit to Supine: 6: Modified independent (Device/Increase time) Transfers Transfers: Yes Sit to Stand: 2: Max assist Sit to Stand Details: Verbal cues for sequencing;Verbal cues for technique;Verbal cues for precautions/safety;Manual facilitation for weight shifting Stand to Sit: 2: Max assist;3: Mod assist Stand to Sit Details (indicate cue type and reason): Visual cues/gestures for sequencing;Verbal cues for sequencing;Verbal cues for technique;Verbal cues for precautions/safety;Manual facilitation for weight shifting Lateral/Scoot Transfers: 5: Supervision Lateral/Scoot Transfer Details: Verbal cues for sequencing;Verbal cues for technique;Verbal cues for precautions/safety Locomotion  Ambulation Ambulation: Yes Ambulation/Gait Assistance: 3: Mod assist;1: +2 Total assist Ambulation Distance (Feet): 15 Feet Assistive device: Rolling walker Gait Gait: Yes Gait Pattern: Impaired Gait Pattern: Step-to pattern;Trunk flexed;Narrow base of support Stairs / Additional Locomotion Stairs: No (pt did not perform due to fatigue and not needing to perform for home entry) Wheelchair Mobility Wheelchair Mobility: Yes Wheelchair Assistance: 6: Modified independent (Device/Increase time) Environmental health practitioner: Both upper extremities Wheelchair Parts Management: Needs assistance Distance: 200  Trunk/Postural Assessment  Cervical Assessment Cervical Assessment: Within Functional Limits Thoracic Assessment Thoracic Assessment: Within Functional Limits Lumbar Assessment Lumbar Assessment: Exceptions to [facility] Lumbar Strength Overall Lumbar Strength Comments: posterior pelvic tilt and very rigid/no pelvic mobility Postural Control Postural Control: Deficits on evaluation Postural Limitations: Pt needs max verbal cues to perform forward weight shift and trunk lean   Balance Balance Balance Assessed:  Yes Static Sitting Balance Static Sitting - Balance Support: Feet supported Static Sitting - Level of Assistance: 6: mod I  Dynamic Sitting Balance Dynamic Sitting - Balance Support: During functional activity;Feet unsupported Dynamic Sitting - Level of Assistance: 6: mod I Static Standing Balance Static Standing - Balance Support: During functional activity Static Standing - Level of Assistance: 3: Mod assist Dynamic Standing Balance Dynamic Standing - Balance Support: During functional activity Dynamic Standing - Level of Assistance: 2: Max assist Extremity Assessment  RUE Assessment RUE Assessment: Within Functional Limits LUE Assessment LUE Assessment: Within Functional Limits RLE Assessment RLE Assessment: Within Functional Limits (hip and knee motions) LLE Assessment LLE Assessment: Within Functional Limits (hip and knee motions)  See FIM for current functional status  Denice Bors 01/15/2015, 1:51 PM

## 2015-01-15 NOTE — Progress Notes (Signed)
Occupational Therapy Discharge Summary  Patient Details  Name: MARON STANZIONE MRN: [id number] Date of Birth: Nov 01, 1945  Patient has met 7 of 7 long term goals due to improved activity tolerance, improved balance and ability to compensate for deficits.  Patient to discharge at overall Supervision level.  Patient able to complete transfer to drop arm BSC with supervision, however will not have drop arm BSC therefore will complete slide board transfer to 3 in 1 with min assist from wife for stability.  Recommend drop arm BSC but is not covered by insurance at this time and pt and wife unwilling to purchase.  Patient's care partner is independent to provide the necessary physical and cognitive assistance at discharge.    Reasons goals not met: N/A  Recommendation:  Patient will benefit from ongoing skilled OT services in home health setting to continue to advance functional skills in the area of BADL and Reduce care partner burden.  Equipment: No equipment provided - Recommend drop arm BSC  Reasons for discharge: treatment goals met and discharge from hospital  Patient/family agrees with progress made and goals achieved: Yes  OT Discharge Precautions/Restrictions  Precautions Precautions: Fall Precaution Comments: hx of dementia, bilateral BKA Restrictions Weight Bearing Restrictions: Yes LLE Weight Bearing: Non weight bearing Pain Pain Assessment Pain Assessment: No/denies pain Pain Score: 2  ADL ADL ADL Comments: see FIM Vision/Perception  Vision- History Patient Visual Report: No change from baseline Vision- Assessment Vision Assessment?: No apparent visual deficits  Cognition Overall Cognitive Status: History of cognitive impairments - at baseline (diagnosis of dementia, but able to recall education and faces from previous stay on rehab) Arousal/Alertness: Awake/alert Orientation Level: Oriented X4 Attention: Selective Selective Attention: Appears intact Memory:  Impaired Memory Impairment: Decreased recall of new information Awareness: Appears intact Problem Solving: Impaired Problem Solving Impairment: Functional basic Safety/Judgment: Impaired Sensation Sensation Light Touch: Impaired Detail Light Touch Impaired Details: Impaired LLE;Impaired RLE Stereognosis: Not tested Hot/Cold: Not tested Proprioception: Appears Intact Coordination Gross Motor Movements are Fluid and Coordinated: Yes Fine Motor Movements are Fluid and Coordinated: Yes Extremity/Trunk Assessment RUE Assessment RUE Assessment: Within Functional Limits LUE Assessment LUE Assessment: Within Functional Limits  See FIM for current functional status  Seniya Stoffers, [facility] 01/15/2015, 12:59 PM

## 2015-01-15 NOTE — Plan of Care (Signed)
Problem: RH Furniture Transfers Goal: LTG Patient will perform furniture transfers w/assist (OT/PT LTG: Patient will perform furniture transfers with assistance (OT/PT).  Outcome: Not Met (add Reason) Requires min A due to uneven surfaces and inability to forward weight shift for full buttocks clearance.

## 2015-01-15 NOTE — Progress Notes (Signed)
Occupational Therapy Session Note  Patient Details  Name: [name] [name] MRN: [id number] Date of Birth: Dec 06, 1945  Today's Date: 01/15/2015 OT Individual Time: 0900-1000 OT Individual Time Calculation (min): 60 min    Short Term Goals: Week 1:  OT Short Term Goal 1 (Week 1): STG=LTG  Skilled Therapeutic Interventions/Progress Updates:    Pt seen for ADL retraining with focus on lateral leans, functional transfers, safety awareness, and activity tolerance. Pt received sitting EOB. Completed bathing and dressing sitting EOB with setup assist to retrieve items. Pt completed buttocks hygiene and donning of pants using lateral lean technique. Completed lateral scoot transfer bed>w/c at supervision level with cues for anterior weight to achieve buttocks clearance and locking w/c brake. Completed lateral scoot transfer w/c<>drop arm BSC at supervision. Pt completed grooming tasks at sink at Mod I. Pt left sitting in w/c with all needs in reach and no questions in regards to discharge.   Therapy Documentation Precautions:  Precautions Precautions: Fall Precaution Comments: hx of dementia, R BKA as well Restrictions Weight Bearing Restrictions: Yes LLE Weight Bearing: Non weight bearing General:   Vital Signs:   Pain: Pain Assessment Pain Score: 2   See FIM for current functional status  Therapy/Group: Individual Therapy  Duayne Cal 01/15/2015, 12:46 PM

## 2015-01-15 NOTE — Progress Notes (Signed)
69 y.o. right handed male with history of diabetes mellitus with peripheral neuropathy, diastolic congestive heart failure, nonischemic cardiomyopathy, PAF with ICD with chronic Coumadin, peripheral vascular disease. [name] [name] lives with his wife used a wheelchair as well as bilateral prosthesis provided by [employer] prosthetics prior to admission. 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 He was discharged to home from latest rehabilitation admission in June 2015. Presented 01/03/2015 with developing wounds distal end of tibial osteotomy of left below-knee amputation site. Underwent revision of left BKA 01/03/2015 per Dr.Xu. Hospital course pain management. Patient remains on aspirin and Plavix therapy as prior to admission along with chronic Coumadin resumed for PAF  Subjective/Complaints: Burning with urination resolved  Review of Systems - Negative except constipation and left stump pain Objective: Vital Signs: Blood pressure 106/63, pulse 69, temperature 98 F (36.7 C), temperature source Oral, resp. rate 17, height 5\' 9"  (1.753 m), weight 84.2 kg (185 lb 10 oz), SpO2 91 %. No results found. Results for orders placed or performed during the hospital encounter of 01/08/15 (from the past 72 hour(s))  Glucose, capillary     Status: Abnormal   Collection Time: 01/12/15 12:04 PM  Result Value Ref Range   Glucose-Capillary 152 (H) 65 - 99 mg/dL   Comment 1 Notify RN   Glucose, capillary     Status: Abnormal   Collection Time: 01/12/15  4:41 PM  Result Value Ref Range   Glucose-Capillary 285 (H) 65 - 99 mg/dL   Comment 1 Notify RN   Glucose, capillary     Status: Abnormal   Collection Time: 01/12/15  9:12 PM  Result Value Ref Range   Glucose-Capillary 406 (H) 65 - 99 mg/dL  Glucose, capillary     Status: Abnormal   Collection Time: 01/12/15 11:24 PM  Result Value Ref Range   Glucose-Capillary 293  (H) 65 - 99 mg/dL  Protime-INR     Status: Abnormal   Collection Time: 01/13/15  5:21 AM  Result Value Ref Range   Prothrombin Time 31.6 (H) 11.6 - 15.2 seconds   INR 3.13 (H) 0.00 - 1.49  Glucose, capillary     Status: Abnormal   Collection Time: 01/13/15  6:44 AM  Result Value Ref Range   Glucose-Capillary 180 (H) 65 - 99 mg/dL  Glucose, capillary     Status: Abnormal   Collection Time: 01/13/15 11:25 AM  Result Value Ref Range   Glucose-Capillary 190 (H) 65 - 99 mg/dL  Glucose, capillary     Status: Abnormal   Collection Time: 01/13/15  4:26 PM  Result Value Ref Range   Glucose-Capillary 136 (H) 65 - 99 mg/dL  Glucose, capillary     Status: Abnormal   Collection Time: 01/13/15  8:54 PM  Result Value Ref Range   Glucose-Capillary 154 (H) 65 - 99 mg/dL  Protime-INR     Status: Abnormal   Collection Time: 01/14/15  5:46 AM  Result Value Ref Range   Prothrombin Time 31.8 (H) 11.6 - 15.2 seconds   INR 3.15 (H) 0.00 - 1.49  Glucose, capillary     Status: Abnormal   Collection Time: 01/14/15  6:46 AM  Result Value Ref Range   Glucose-Capillary 150 (H) 65 - 99 mg/dL  Glucose, capillary     Status: Abnormal   Collection Time: 01/14/15 11:29 AM  Result Value Ref Range   Glucose-Capillary 239 (H) 65 - 99 mg/dL  Glucose,  capillary     Status: Abnormal   Collection Time: 01/14/15  4:30 PM  Result Value Ref Range   Glucose-Capillary 182 (H) 65 - 99 mg/dL  Glucose, capillary     Status: Abnormal   Collection Time: 01/14/15  8:39 PM  Result Value Ref Range   Glucose-Capillary 239 (H) 65 - 99 mg/dL  CBC     Status: Abnormal   Collection Time: 01/15/15  6:15 AM  Result Value Ref Range   WBC 4.2 4.0 - 10.5 K/uL   RBC 4.60 4.22 - 5.81 MIL/uL   Hemoglobin 11.2 (L) 13.0 - 17.0 g/dL   HCT 33.8 (L) 39.0 - 52.0 %   MCV 73.5 (L) 78.0 - 100.0 fL   MCH 24.3 (L) 26.0 - 34.0 pg   MCHC 33.1 30.0 - 36.0 g/dL   RDW 15.8 (H) 11.5 - 15.5 %   Platelets 294 150 - 400 K/uL  Glucose, capillary      Status: Abnormal   Collection Time: 01/15/15  6:35 AM  Result Value Ref Range   Glucose-Capillary 159 (H) 65 - 99 mg/dL     HEENT: normal Cardio: RRR Resp: CTA B/L GI: BS positive and NT, ND Extremity:  Edema left stump Skin:   Wound Coban wrap on left BKA Neuro: Alert/Oriented and Abnormal Motor 5/5 in BUE, 5/5 in B HF, right KE, 4- LKE Musc/Skel:  Swelling left BKA GEN NAD     Assessment/Plan: 1. Functional deficits secondary to Left BKA revision Stable for D/C today after completion of family training F/u PCP in 1-2 weeks F/u ortho 1 week See D/C summary See D/C instructions FIM: FIM - Bathing Bathing Steps Patient Completed: Chest, Right Arm, Left Arm, Abdomen, Front perineal area, Buttocks, Right upper leg, Left upper leg Bathing: 5: Set-up assist to: Obtain items  FIM - Upper Body Dressing/Undressing Upper body dressing/undressing steps patient completed: Thread/unthread right sleeve of pullover shirt/dresss, Thread/unthread left sleeve of pullover shirt/dress, Put head through opening of pull over shirt/dress, Pull shirt over trunk Upper body dressing/undressing: 5: Set-up assist to: Obtain clothing/put away FIM - Lower Body Dressing/Undressing Lower body dressing/undressing steps patient completed: Thread/unthread right underwear leg, Thread/unthread left underwear leg, Thread/unthread right pants leg, Thread/unthread left pants leg, Pull pants up/down, Don/Doff right shoe, Pull underwear up/down Lower body dressing/undressing: 5: Set-up assist to: Obtain clothing  FIM - Toileting Toileting steps completed by patient: Adjust clothing prior to toileting, Adjust clothing after toileting, Performs perineal hygiene Toileting Assistive Devices: Grab bar or rail for support Toileting: 5: Supervision: Safety issues/verbal cues  FIM - Radio producer Devices: Bedside commode, Prosthesis Toilet Transfers: 5-To toilet/BSC: Supervision (verbal  cues/safety issues), 5-From toilet/BSC: Supervision (verbal cues/safety issues)  FIM - Control and instrumentation engineer Devices: Arm rests Bed/Chair Transfer: 5: Bed > Chair or W/C: Supervision (verbal cues/safety issues), 5: Chair or W/C > Bed: Supervision (verbal cues/safety issues)  FIM - Locomotion: Wheelchair Distance: 300 Locomotion: Wheelchair: 5: Travels 150 ft or more: maneuvers on rugs and over door sills with supervision, cueing or coaxing FIM - Locomotion: Ambulation Locomotion: Ambulation Assistive Devices: Administrator Ambulation/Gait Assistance: 3: Mod assist, 1: +2 Total assist Locomotion: Ambulation: 0: Activity did not occur  Comprehension Comprehension Mode: Auditory Comprehension: 5-Follows basic conversation/direction: With no assist  Expression Expression Mode: Verbal Expression: 5-Expresses basic needs/ideas: With no assist  Social Interaction Social Interaction: 6-Interacts appropriately with others with medication or extra time (anti-anxiety, antidepressant).  Problem Solving Problem Solving: 3-Solves basic  50 - 74% of the time/requires cueing 25 - 49% of the time  Memory Memory: 4-Recognizes or recalls 75 - 89% of the time/requires cueing 10 - 24% of the time   Medical Problem List and Plan: 1. Functional deficits secondary to left BKA revision, history of right BKA with bilateral prosthesis 2. DVT Prophylaxis/Anticoagulation: Chronic Coumadin therapy for PAF, ASA, Plavix 3. Pain Management: Lyrica 75 mg twice a day, Oxycodone and Robaxin as needed. Monitor with increased mobility 4. Acute blood loss anemia. Follow-up CBC/continue Niferex 5. Neuropsych: This patient is capable of making decisions on his own behalf. 6. Skin/Wound Care: Routine skin checks 7. Fluids/Electrolytes/Nutrition: Routine I nose with follow-up chemistries 8. Diabetes mellitus with peripheral neuropathy. Check blood sugars before meals and at bedtime.CBGs  still elevated home dose is 25 Units in am and 35 Units in pm, adjust, UTI may be elevating values 9. Nonischemic cardiomyopathy/PAF/ICD. Coreg 12.5 mg twice a day, , Aldactone 12.5 mg daily at bedtime. Monitor with increased mobility 10. Diastolic congestive heart failure. Lasix 40 mg twice daily. Monitor for any signs of fluid overload 11. Hyperlipidemia. Lipitor 12. GERD. Protonix 13.  Dysuria +UA, keflex changed to bactrim based on sensitivities LOS (Days) 7 A FACE TO FACE EVALUATION WAS PERFORMED  KIRSTEINS,ANDREW E 01/15/2015, 7:13 AM

## 2015-01-15 NOTE — Discharge Instructions (Signed)
Inpatient Rehab Discharge Instructions  [name] [name] Discharge date and time: No discharge date for patient encounter.   Activities/Precautions/ Functional Status: Activity: activity as tolerated Diet: diabetic diet Wound Care: keep wound clean and dry Functional status:  ___ No restrictions     ___ Walk up steps independently ___ 24/7 supervision/assistance   ___ Walk up steps with assistance ___ Intermittent supervision/assistance  ___ Bathe/dress independently ___ Walk with walker     ___ Bathe/dress with assistance ___ Walk Independently    ___ Shower independently ___ Walk with assistance    ___ Shower with assistance ___ No alcohol     ___ Return to work/school ________  COMMUNITY REFERRALS UPON DISCHARGE:   Home Health:   PT     OT     RN  Agency:  [facility] Phone:  [phone] Medical Equipment/Items Ordered:  You have all needed equipment at home  [place] PATIENT/FAMILY: Support Groups:  [place]  [phone]                                Meets first Tuesday of the month from 7-8:30pm at the [facility]   [phone]  Special Instructions:  Home health nurse to check INR on  01/17/2015         results to [facility] Coumadin clinic [phone] fax number [phone]  My questions have been answered and I understand these instructions. I will adhere to these goals and the provided educational materials after my discharge from the hospital.  Patient/Caregiver Signature _______________________________ Date __________  Clinician Signature _______________________________________ Date __________  Please bring this form and your medication list with you to all your follow-up doctor's appointments.

## 2015-01-22 LAB — POCT INR: INR: 1.1

## 2015-01-23 ENCOUNTER — Ambulatory Visit (INDEPENDENT_AMBULATORY_CARE_PROVIDER_SITE_OTHER): Payer: Medicare Other | Admitting: Cardiology

## 2015-01-23 ENCOUNTER — Ambulatory Visit (INDEPENDENT_AMBULATORY_CARE_PROVIDER_SITE_OTHER): Payer: Medicare Other | Admitting: *Deleted

## 2015-01-23 DIAGNOSIS — I4891 Unspecified atrial fibrillation: Secondary | ICD-10-CM

## 2015-01-23 DIAGNOSIS — I429 Cardiomyopathy, unspecified: Secondary | ICD-10-CM | POA: Diagnosis not present

## 2015-01-23 DIAGNOSIS — I428 Other cardiomyopathies: Secondary | ICD-10-CM

## 2015-01-23 DIAGNOSIS — Z5181 Encounter for therapeutic drug level monitoring: Secondary | ICD-10-CM

## 2015-01-23 NOTE — Progress Notes (Signed)
Remote ICD transmission.   

## 2015-01-25 ENCOUNTER — Ambulatory Visit (INDEPENDENT_AMBULATORY_CARE_PROVIDER_SITE_OTHER): Payer: Medicare Other | Admitting: Internal Medicine

## 2015-01-25 DIAGNOSIS — I4891 Unspecified atrial fibrillation: Secondary | ICD-10-CM

## 2015-01-25 DIAGNOSIS — Z5181 Encounter for therapeutic drug level monitoring: Secondary | ICD-10-CM

## 2015-01-25 LAB — POCT INR: INR: 1.1

## 2015-01-28 LAB — CUP PACEART REMOTE DEVICE CHECK
Battery Remaining Longevity: 60 mo
Battery Remaining Percentage: 92 %
Brady Statistic RV Percent Paced: 99 %
Date Time Interrogation Session: 20160531140400
HighPow Impedance: 48 Ohm
Lead Channel Impedance Value: 474 Ohm
Lead Channel Impedance Value: 565 Ohm
Lead Channel Pacing Threshold Amplitude: 0.5 V
Lead Channel Pacing Threshold Amplitude: 0.8 V
Lead Channel Pacing Threshold Amplitude: 1 V
Lead Channel Pacing Threshold Pulse Width: 0.4 ms
Lead Channel Pacing Threshold Pulse Width: 0.4 ms
Lead Channel Pacing Threshold Pulse Width: 0.4 ms
Lead Channel Sensing Intrinsic Amplitude: 17 mV
Lead Channel Sensing Intrinsic Amplitude: 4.7 mV
Lead Channel Setting Pacing Amplitude: 2.4 V
Lead Channel Setting Pacing Amplitude: 2.4 V
Lead Channel Setting Pacing Pulse Width: 0.4 ms
Lead Channel Setting Sensing Sensitivity: 0.5 mV
MDC IDC MSMT LEADCHNL LV IMPEDANCE VALUE: 560 Ohm
MDC IDC SET LEADCHNL LV SENSING SENSITIVITY: 1 mV
MDC IDC SET LEADCHNL RA PACING AMPLITUDE: 2 V
MDC IDC SET LEADCHNL RV PACING PULSEWIDTH: 0.4 ms
MDC IDC STAT BRADY RA PERCENT PACED: 0 %
Pulse Gen Serial Number: 587847
Zone Setting Detection Interval: 250 ms
Zone Setting Detection Interval: 300 ms
Zone Setting Detection Interval: 343 ms

## 2015-02-01 ENCOUNTER — Ambulatory Visit (INDEPENDENT_AMBULATORY_CARE_PROVIDER_SITE_OTHER): Payer: Medicare Other | Admitting: Cardiovascular Disease

## 2015-02-01 DIAGNOSIS — I4891 Unspecified atrial fibrillation: Secondary | ICD-10-CM

## 2015-02-01 DIAGNOSIS — Z5181 Encounter for therapeutic drug level monitoring: Secondary | ICD-10-CM

## 2015-02-01 LAB — POCT INR: INR: 1.4

## 2015-02-07 ENCOUNTER — Ambulatory Visit (INDEPENDENT_AMBULATORY_CARE_PROVIDER_SITE_OTHER): Payer: Medicare Other | Admitting: Internal Medicine

## 2015-02-07 DIAGNOSIS — I4891 Unspecified atrial fibrillation: Secondary | ICD-10-CM

## 2015-02-07 DIAGNOSIS — Z5181 Encounter for therapeutic drug level monitoring: Secondary | ICD-10-CM

## 2015-02-07 LAB — POCT INR: INR: 2.4

## 2015-02-14 ENCOUNTER — Encounter: Payer: Self-pay | Admitting: Cardiology

## 2015-02-15 ENCOUNTER — Ambulatory Visit (INDEPENDENT_AMBULATORY_CARE_PROVIDER_SITE_OTHER): Payer: Medicare Other | Admitting: *Deleted

## 2015-02-15 DIAGNOSIS — Z5181 Encounter for therapeutic drug level monitoring: Secondary | ICD-10-CM

## 2015-02-15 DIAGNOSIS — I4891 Unspecified atrial fibrillation: Secondary | ICD-10-CM

## 2015-02-15 LAB — POCT INR: INR: 3.1

## 2015-02-21 ENCOUNTER — Encounter: Payer: Self-pay | Admitting: Internal Medicine

## 2015-02-21 ENCOUNTER — Ambulatory Visit (INDEPENDENT_AMBULATORY_CARE_PROVIDER_SITE_OTHER): Payer: Medicare Other | Admitting: *Deleted

## 2015-02-21 DIAGNOSIS — Z5181 Encounter for therapeutic drug level monitoring: Secondary | ICD-10-CM | POA: Diagnosis not present

## 2015-02-21 DIAGNOSIS — I4891 Unspecified atrial fibrillation: Secondary | ICD-10-CM

## 2015-02-21 LAB — POCT INR: INR: 2.5

## 2015-03-07 ENCOUNTER — Ambulatory Visit (INDEPENDENT_AMBULATORY_CARE_PROVIDER_SITE_OTHER): Payer: Medicare Other | Admitting: *Deleted

## 2015-03-07 DIAGNOSIS — I4891 Unspecified atrial fibrillation: Secondary | ICD-10-CM

## 2015-03-07 DIAGNOSIS — Z5181 Encounter for therapeutic drug level monitoring: Secondary | ICD-10-CM | POA: Diagnosis not present

## 2015-03-07 LAB — POCT INR: INR: 3.2

## 2015-03-21 ENCOUNTER — Ambulatory Visit (INDEPENDENT_AMBULATORY_CARE_PROVIDER_SITE_OTHER): Payer: Medicare Other | Admitting: *Deleted

## 2015-03-21 DIAGNOSIS — Z5181 Encounter for therapeutic drug level monitoring: Secondary | ICD-10-CM | POA: Diagnosis not present

## 2015-03-21 DIAGNOSIS — I4891 Unspecified atrial fibrillation: Secondary | ICD-10-CM | POA: Diagnosis not present

## 2015-03-21 LAB — POCT INR: INR: 2.9

## 2015-03-27 ENCOUNTER — Other Ambulatory Visit: Payer: Self-pay | Admitting: Physical Medicine & Rehabilitation

## 2015-03-30 ENCOUNTER — Other Ambulatory Visit: Payer: Self-pay | Admitting: Physical Medicine & Rehabilitation

## 2015-04-18 ENCOUNTER — Ambulatory Visit (INDEPENDENT_AMBULATORY_CARE_PROVIDER_SITE_OTHER): Payer: Medicare Other | Admitting: Pharmacist

## 2015-04-18 DIAGNOSIS — I4891 Unspecified atrial fibrillation: Secondary | ICD-10-CM

## 2015-04-18 DIAGNOSIS — Z5181 Encounter for therapeutic drug level monitoring: Secondary | ICD-10-CM

## 2015-04-18 LAB — POCT INR: INR: 4.8

## 2015-04-24 ENCOUNTER — Ambulatory Visit (INDEPENDENT_AMBULATORY_CARE_PROVIDER_SITE_OTHER): Payer: Medicare Other | Admitting: *Deleted

## 2015-04-24 ENCOUNTER — Telehealth: Payer: Self-pay | Admitting: Cardiology

## 2015-04-24 DIAGNOSIS — I428 Other cardiomyopathies: Secondary | ICD-10-CM

## 2015-04-24 DIAGNOSIS — I429 Cardiomyopathy, unspecified: Secondary | ICD-10-CM | POA: Diagnosis not present

## 2015-04-24 NOTE — Telephone Encounter (Signed)
Spoke with pt and reminded pt of remote transmission that is due today. Pt verbalized understanding.   

## 2015-04-25 NOTE — Progress Notes (Signed)
Remote ICD transmission.   

## 2015-04-30 ENCOUNTER — Other Ambulatory Visit: Payer: Self-pay | Admitting: Internal Medicine

## 2015-05-01 ENCOUNTER — Ambulatory Visit (INDEPENDENT_AMBULATORY_CARE_PROVIDER_SITE_OTHER): Payer: Medicare Other | Admitting: Pharmacist Clinician (PhC)/ Clinical Pharmacy Specialist

## 2015-05-01 DIAGNOSIS — I4891 Unspecified atrial fibrillation: Secondary | ICD-10-CM | POA: Diagnosis not present

## 2015-05-01 DIAGNOSIS — Z5181 Encounter for therapeutic drug level monitoring: Secondary | ICD-10-CM

## 2015-05-01 LAB — POCT INR: INR: 3.5

## 2015-05-03 LAB — CUP PACEART REMOTE DEVICE CHECK
Brady Statistic RV Percent Paced: 98 %
Date Time Interrogation Session: 20160830191000
HIGH POWER IMPEDANCE MEASURED VALUE: 49 Ohm
Lead Channel Impedance Value: 503 Ohm
Lead Channel Impedance Value: 637 Ohm
Lead Channel Pacing Threshold Amplitude: 0.8 V
Lead Channel Pacing Threshold Pulse Width: 0.4 ms
Lead Channel Sensing Intrinsic Amplitude: 12.8 mV
Lead Channel Setting Pacing Amplitude: 2.4 V
Lead Channel Setting Pacing Amplitude: 2.4 V
Lead Channel Setting Pacing Pulse Width: 0.4 ms
Lead Channel Setting Sensing Sensitivity: 1 mV
MDC IDC MSMT BATTERY REMAINING LONGEVITY: 54 mo
MDC IDC MSMT BATTERY REMAINING PERCENTAGE: 88 %
MDC IDC MSMT LEADCHNL LV PACING THRESHOLD AMPLITUDE: 1 V
MDC IDC MSMT LEADCHNL LV PACING THRESHOLD PULSEWIDTH: 0.4 ms
MDC IDC MSMT LEADCHNL LV SENSING INTR AMPL: 11.4 mV
MDC IDC MSMT LEADCHNL RA IMPEDANCE VALUE: 571 Ohm
MDC IDC MSMT LEADCHNL RA PACING THRESHOLD AMPLITUDE: 0.5 V
MDC IDC MSMT LEADCHNL RA SENSING INTR AMPL: 4.3 mV
MDC IDC MSMT LEADCHNL RV PACING THRESHOLD PULSEWIDTH: 0.4 ms
MDC IDC SET LEADCHNL RA PACING AMPLITUDE: 2 V
MDC IDC SET LEADCHNL RV PACING PULSEWIDTH: 0.4 ms
MDC IDC SET LEADCHNL RV SENSING SENSITIVITY: 0.5 mV
MDC IDC SET ZONE DETECTION INTERVAL: 250 ms
MDC IDC STAT BRADY RA PERCENT PACED: 0 %
Pulse Gen Serial Number: 587847
Zone Setting Detection Interval: 300 ms
Zone Setting Detection Interval: 343 ms

## 2015-05-04 ENCOUNTER — Encounter: Payer: Self-pay | Admitting: Cardiology

## 2015-05-05 ENCOUNTER — Other Ambulatory Visit: Payer: Self-pay | Admitting: Physical Medicine & Rehabilitation

## 2015-05-15 ENCOUNTER — Encounter: Payer: Self-pay | Admitting: Internal Medicine

## 2015-05-16 ENCOUNTER — Ambulatory Visit (INDEPENDENT_AMBULATORY_CARE_PROVIDER_SITE_OTHER): Payer: Medicare Other | Admitting: *Deleted

## 2015-05-16 DIAGNOSIS — I4891 Unspecified atrial fibrillation: Secondary | ICD-10-CM | POA: Diagnosis not present

## 2015-05-16 DIAGNOSIS — Z5181 Encounter for therapeutic drug level monitoring: Secondary | ICD-10-CM

## 2015-05-16 LAB — POCT INR: INR: 2.6

## 2015-06-02 ENCOUNTER — Other Ambulatory Visit: Payer: Self-pay | Admitting: Physical Medicine & Rehabilitation

## 2015-06-04 ENCOUNTER — Ambulatory Visit (INDEPENDENT_AMBULATORY_CARE_PROVIDER_SITE_OTHER): Payer: Medicare Other | Admitting: Pharmacist

## 2015-06-04 DIAGNOSIS — I4891 Unspecified atrial fibrillation: Secondary | ICD-10-CM

## 2015-06-04 DIAGNOSIS — Z5181 Encounter for therapeutic drug level monitoring: Secondary | ICD-10-CM | POA: Diagnosis not present

## 2015-06-04 LAB — POCT INR: INR: 1.8

## 2015-06-25 ENCOUNTER — Ambulatory Visit (INDEPENDENT_AMBULATORY_CARE_PROVIDER_SITE_OTHER): Payer: Medicare Other | Admitting: Pharmacist

## 2015-06-25 DIAGNOSIS — I4891 Unspecified atrial fibrillation: Secondary | ICD-10-CM | POA: Diagnosis not present

## 2015-06-25 DIAGNOSIS — Z5181 Encounter for therapeutic drug level monitoring: Secondary | ICD-10-CM | POA: Diagnosis not present

## 2015-06-25 LAB — POCT INR: INR: 2.9

## 2015-06-28 ENCOUNTER — Other Ambulatory Visit: Payer: Self-pay | Admitting: Physical Medicine & Rehabilitation

## 2015-07-23 ENCOUNTER — Ambulatory Visit (INDEPENDENT_AMBULATORY_CARE_PROVIDER_SITE_OTHER): Payer: Medicare Other | Admitting: *Deleted

## 2015-07-23 DIAGNOSIS — Z5181 Encounter for therapeutic drug level monitoring: Secondary | ICD-10-CM

## 2015-07-23 DIAGNOSIS — I4891 Unspecified atrial fibrillation: Secondary | ICD-10-CM

## 2015-07-23 LAB — POCT INR: INR: 5.1

## 2015-07-25 ENCOUNTER — Ambulatory Visit (INDEPENDENT_AMBULATORY_CARE_PROVIDER_SITE_OTHER): Payer: Medicare Other | Admitting: *Deleted

## 2015-07-25 DIAGNOSIS — I429 Cardiomyopathy, unspecified: Secondary | ICD-10-CM

## 2015-07-25 DIAGNOSIS — I428 Other cardiomyopathies: Secondary | ICD-10-CM

## 2015-07-26 NOTE — Progress Notes (Signed)
Remote ICD transmission.   

## 2015-08-02 ENCOUNTER — Ambulatory Visit (INDEPENDENT_AMBULATORY_CARE_PROVIDER_SITE_OTHER): Payer: Medicare Other | Admitting: *Deleted

## 2015-08-02 DIAGNOSIS — Z5181 Encounter for therapeutic drug level monitoring: Secondary | ICD-10-CM | POA: Diagnosis not present

## 2015-08-02 DIAGNOSIS — I4891 Unspecified atrial fibrillation: Secondary | ICD-10-CM | POA: Diagnosis not present

## 2015-08-02 LAB — POCT INR: INR: 1.4

## 2015-08-03 LAB — CUP PACEART REMOTE DEVICE CHECK
Brady Statistic RA Percent Paced: 0 %
Brady Statistic RV Percent Paced: 98 %
HIGH POWER IMPEDANCE MEASURED VALUE: 48 Ohm
Implantable Lead Implant Date: 20050429
Implantable Lead Location: 753858
Implantable Lead Location: 753860
Implantable Lead Model: 158
Implantable Lead Model: 5076
Implantable Lead Serial Number: 406736
Lead Channel Impedance Value: 578 Ohm
Lead Channel Pacing Threshold Amplitude: 0.5 V
Lead Channel Pacing Threshold Amplitude: 0.8 V
Lead Channel Pacing Threshold Pulse Width: 0.4 ms
Lead Channel Pacing Threshold Pulse Width: 0.4 ms
Lead Channel Setting Pacing Amplitude: 2 V
Lead Channel Setting Pacing Amplitude: 2.4 V
Lead Channel Setting Pacing Pulse Width: 0.4 ms
Lead Channel Setting Pacing Pulse Width: 0.4 ms
Lead Channel Setting Sensing Sensitivity: 1 mV
MDC IDC LEAD IMPLANT DT: 20050429
MDC IDC LEAD IMPLANT DT: 20050429
MDC IDC LEAD LOCATION: 753859
MDC IDC LEAD MODEL: 4513
MDC IDC LEAD SERIAL: 154821
MDC IDC MSMT BATTERY REMAINING LONGEVITY: 54 mo
MDC IDC MSMT BATTERY REMAINING PERCENTAGE: 84 %
MDC IDC MSMT LEADCHNL LV IMPEDANCE VALUE: 591 Ohm
MDC IDC MSMT LEADCHNL LV PACING THRESHOLD AMPLITUDE: 1 V
MDC IDC MSMT LEADCHNL LV PACING THRESHOLD PULSEWIDTH: 0.4 ms
MDC IDC MSMT LEADCHNL RV IMPEDANCE VALUE: 483 Ohm
MDC IDC PG SERIAL: 587847
MDC IDC SESS DTM: 20161130052500
MDC IDC SET LEADCHNL LV PACING AMPLITUDE: 2.4 V
MDC IDC SET LEADCHNL RV SENSING SENSITIVITY: 0.5 mV

## 2015-08-07 ENCOUNTER — Encounter: Payer: Self-pay | Admitting: Cardiology

## 2015-08-08 ENCOUNTER — Other Ambulatory Visit: Payer: Self-pay | Admitting: Internal Medicine

## 2015-08-13 ENCOUNTER — Ambulatory Visit (INDEPENDENT_AMBULATORY_CARE_PROVIDER_SITE_OTHER): Payer: Medicare Other

## 2015-08-13 DIAGNOSIS — Z5181 Encounter for therapeutic drug level monitoring: Secondary | ICD-10-CM

## 2015-08-13 DIAGNOSIS — I4891 Unspecified atrial fibrillation: Secondary | ICD-10-CM

## 2015-08-13 LAB — POCT INR: INR: 2.4

## 2015-08-29 ENCOUNTER — Ambulatory Visit (INDEPENDENT_AMBULATORY_CARE_PROVIDER_SITE_OTHER): Payer: Medicare Other | Admitting: Pharmacist

## 2015-08-29 DIAGNOSIS — Z5181 Encounter for therapeutic drug level monitoring: Secondary | ICD-10-CM

## 2015-08-29 DIAGNOSIS — I4891 Unspecified atrial fibrillation: Secondary | ICD-10-CM | POA: Diagnosis not present

## 2015-08-29 LAB — POCT INR: INR: 2.7

## 2015-09-19 ENCOUNTER — Ambulatory Visit (INDEPENDENT_AMBULATORY_CARE_PROVIDER_SITE_OTHER): Payer: Medicare Other | Admitting: *Deleted

## 2015-09-19 DIAGNOSIS — I4891 Unspecified atrial fibrillation: Secondary | ICD-10-CM | POA: Diagnosis not present

## 2015-09-19 DIAGNOSIS — Z5181 Encounter for therapeutic drug level monitoring: Secondary | ICD-10-CM

## 2015-09-19 LAB — POCT INR: INR: 1.8

## 2015-10-10 ENCOUNTER — Ambulatory Visit (INDEPENDENT_AMBULATORY_CARE_PROVIDER_SITE_OTHER): Payer: Medicare Other | Admitting: Surgery

## 2015-10-10 DIAGNOSIS — Z5181 Encounter for therapeutic drug level monitoring: Secondary | ICD-10-CM | POA: Diagnosis not present

## 2015-10-10 DIAGNOSIS — I4891 Unspecified atrial fibrillation: Secondary | ICD-10-CM | POA: Diagnosis not present

## 2015-10-10 LAB — POCT INR: INR: 5.1

## 2015-10-17 ENCOUNTER — Ambulatory Visit (INDEPENDENT_AMBULATORY_CARE_PROVIDER_SITE_OTHER): Payer: Medicare Other | Admitting: *Deleted

## 2015-10-17 DIAGNOSIS — I4891 Unspecified atrial fibrillation: Secondary | ICD-10-CM

## 2015-10-17 DIAGNOSIS — Z5181 Encounter for therapeutic drug level monitoring: Secondary | ICD-10-CM | POA: Diagnosis not present

## 2015-10-17 LAB — POCT INR: INR: 3.2

## 2015-10-31 ENCOUNTER — Encounter: Payer: Self-pay | Admitting: Internal Medicine

## 2015-10-31 ENCOUNTER — Ambulatory Visit (INDEPENDENT_AMBULATORY_CARE_PROVIDER_SITE_OTHER): Payer: Medicare Other | Admitting: *Deleted

## 2015-10-31 ENCOUNTER — Ambulatory Visit (INDEPENDENT_AMBULATORY_CARE_PROVIDER_SITE_OTHER): Payer: Medicare Other | Admitting: Internal Medicine

## 2015-10-31 VITALS — BP 134/72 | HR 83 | Ht 69.0 in | Wt 204.8 lb

## 2015-10-31 DIAGNOSIS — Z95810 Presence of automatic (implantable) cardiac defibrillator: Secondary | ICD-10-CM | POA: Diagnosis not present

## 2015-10-31 DIAGNOSIS — I5022 Chronic systolic (congestive) heart failure: Secondary | ICD-10-CM | POA: Diagnosis not present

## 2015-10-31 DIAGNOSIS — I480 Paroxysmal atrial fibrillation: Secondary | ICD-10-CM

## 2015-10-31 DIAGNOSIS — I429 Cardiomyopathy, unspecified: Secondary | ICD-10-CM

## 2015-10-31 DIAGNOSIS — Z5181 Encounter for therapeutic drug level monitoring: Secondary | ICD-10-CM

## 2015-10-31 DIAGNOSIS — I4891 Unspecified atrial fibrillation: Secondary | ICD-10-CM

## 2015-10-31 DIAGNOSIS — I428 Other cardiomyopathies: Secondary | ICD-10-CM

## 2015-10-31 LAB — POCT INR: INR: 1.9

## 2015-10-31 MED ORDER — SACUBITRIL-VALSARTAN 24-26 MG PO TABS: 1.0000 | ORAL_TABLET | Freq: Two times a day (BID) | ORAL | Status: DC

## 2015-10-31 NOTE — Progress Notes (Signed)
Patient Care Team: Josetta Huddle, MD as PCP - General (Internal Medicine)   HPI  [name] [name] is a 70 y.o. male  seen as a followup as per MADIT-CRT trial implanted for a nonischemic cardiomyopathy with a CRT-D device. He is status post generator replacement   He has intercurrently undergone bilateral BKA and is walking with bilateral prosthetic limbs  He denies shortness of breath   Echocardiogram 6/14 demonstrated ejection fraction of 25%   Chart reviewed  ARB stopped apparentnly inadvertnetly following ortho admission 2/15  The patient denies chest pain, shortness of breath, nocturnal dyspnea, orthopnea or peripheral edema.  There have been no palpitations, lightheadedness or syncope.    Past Medical History  Diagnosis Date  . Cardiomyopathy, nonischemic ([place])     a. Cath 2003: mild nonobstructive CAD, EF 25% at that time.  [name] PAF (paroxysmal atrial fibrillation) ([place])     a. Noted on ICD interrogation 2012;  b. coumadin d/c'd 01/2013.  [name] NSVT (nonsustained ventricular tachycardia) ([place])     a. Noted on ICD interrogation in 2011.  . High cholesterol     takes Atorvastatin daily  . PAD (peripheral artery disease) ([place])     a. 08/2013 Periph Angio/PTA: Abd Ao nl, RLE- 3v runoff, PT diff dzs, AT 90p, LLE 2v runoff, PT 100, AT 19m (diamondback ORA/chocolate balloon PTA).  . Automatic implantable cardioverter-defibrillator in situ     a. 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  . Dementia   . Arthritis   . History of blood transfusion     no abnormal reaction noted  . Complication of anesthesia   . PONV (postoperative nausea and vomiting)   . Hypertension     takes Coreg daily  . LBBB (left bundle branch block)     takes Coumadin daily  . Anemia     takes Iron pill daily  . Constipation     takes Sennokot daily  . GERD (gastroesophageal reflux disease)     takes Nexium daily  . Chronic systolic CHF (congestive heart failure)  (HCC)     takes Furosemide daily as well as Aldactone  . Pneumonia     hx of > 39yr ago  . History of bronchitis     > 1 yr ago  . Headache     occasionally  . Peripheral neuropathy (HCC)     hands;numbness and tingling   . Urinary frequency   . Urinary urgency   . History of kidney stones   . Type II diabetes mellitus ([place])     takes Levemir daily as well as Novolog  . CAD (coronary artery disease)     Past Surgical History  Procedure Laterality Date  . Lithotripsy  2001  . Cervical spine surgery  1994  . Cardiac defibrillator placement  06/2009    WITH GENERATOR REPLACED; BiV ICD  . US echocardiography  03/21/2008    EF 30-35%  . Cardiovascular stress test  03/20/2009    EF 33%  . Transluminal atherectomy tibial artery Left 09/12/2013  . Toe amputation  10/04/2013    LEFT GREAT TOE AND 4TH TOE   /   DR Erlinda Hong  . Amputation Left 10/04/2013    Procedure: LEFT GREAT TOE AND SECOND TOE AMPUTATION;  Surgeon: Marianna Payment, MD;  Location: [place];  Service: Orthopedics;  Laterality: Left;  . Colonoscopy    . Leg amputation below knee Left 11/09/2013    DR Erlinda Hong  .  Amputation Left 11/09/2013    Procedure: LEFT AMPUTATION BELOW KNEE;  Surgeon: Marianna Payment, MD;  Location: [place];  Service: Orthopedics;  Laterality: Left;  . Amputation Right 01/04/2014    Procedure: Doristine Devoid second and third toe amputation;  Surgeon: Marianna Payment, MD;  Location: [place];  Service: Orthopedics;  Laterality: Right;  . Amputation Right 01/30/2014    Procedure: RIGHT BELOW KNEE AMPUTATION;  Surgeon: Marianna Payment, MD;  Location: [place];  Service: Orthopedics;  Laterality: Right;  . Lower extremity angiogram Bilateral 09/12/2013    Procedure: LOWER EXTREMITY ANGIOGRAM;  Surgeon: Lorretta Harp, MD;  Location: [facility] CATH LAB;  Service: Cardiovascular;  Laterality: Bilateral;  . Abdominal angiogram  09/12/2013    Procedure: ABDOMINAL ANGIOGRAM;  Surgeon: Lorretta Harp, MD;  Location: [facility] CATH LAB;  Service:  Cardiovascular;;  . Lower extremity angiogram N/A 12/29/2013    Procedure: LOWER EXTREMITY ANGIOGRAM;  Surgeon: Lorretta Harp, MD;  Location: [facility] CATH LAB;  Service: Cardiovascular;  Laterality: N/A;  . Atherectomy Right 12/29/2013    Procedure: ATHERECTOMY;  Surgeon: Lorretta Harp, MD;  Location: [facility] CATH LAB;  Service: Cardiovascular;  Laterality: Right;  Anterior Tibial Artery  . Cardiac catheterization  10/01/2001    THERE WAS GLOBAL HYPOKINESIS AND EF 25%. THERE APPEARED TO BE GLOBAL DECREASE IN WALL MOTION  . Esophagogastroduodenoscopy    . Stump revision Left 01/03/2015    Procedure: LEFT BELOW KNEE AMPUTATION REVISION;  Surgeon: Leandrew Koyanagi, MD;  Location: [place];  Service: Orthopedics;  Laterality: Left;    Current Outpatient Prescriptions  Medication Sig Dispense Refill  . aspirin 81 MG EC tablet Take 1 tablet (81 mg total) by mouth daily. 30 tablet 11  . atorvastatin (LIPITOR) 10 MG tablet Take 1 tablet (10 mg total) by mouth daily. 30 tablet 1  . carvedilol (COREG) 12.5 MG tablet Take 1 tablet (12.5 mg total) by mouth 2 (two) times daily with a meal. 60 tablet 1  . cholecalciferol (VITAMIN D) 1000 UNITS tablet Take 1 tablet (1,000 Units total) by mouth daily. 30 tablet 1  . clopidogrel (PLAVIX) 75 MG tablet Take 1 tablet (75 mg total) by mouth daily with breakfast. 30 tablet 1  . esomeprazole (NEXIUM) 40 MG capsule Take 1 capsule (40 mg total) by mouth daily as needed (acid reflux). 30 capsule 1  . furosemide (LASIX) 40 MG tablet Take 1 tablet (40 mg total) by mouth 2 (two) times daily. 60 tablet 1  . insulin detemir (LEVEMIR) 100 UNIT/ML injection 30 unit AM and 40 unit PM 10 mL 11  . iron polysaccharides (NIFEREX) 150 MG capsule Take 1 capsule (150 mg total) by mouth daily. 30 capsule 1  . Multiple Vitamin (MULTIVITAMIN WITH MINERALS) TABS tablet Take 1 tablet by mouth daily.    . potassium chloride SA (K-DUR,KLOR-CON) 20 MEQ tablet Take 1 tablet (20 mEq total) by mouth 2 (two)  times daily. 60 tablet 1  . pregabalin (LYRICA) 75 MG capsule Take 1 capsule (75 mg total) by mouth 2 (two) times daily. 60 capsule 1  . senna-docusate (SENOKOT-S) 8.6-50 MG per tablet Take 1 tablet by mouth 2 (two) times daily. 60 tablet 1  . spironolactone (ALDACTONE) 25 MG tablet Take 0.5 tablets (12.5 mg total) by mouth at bedtime. 30 tablet 1  . sulfamethoxazole-trimethoprim (BACTRIM DS,SEPTRA DS) 800-160 MG per tablet Take 1 tablet by mouth every 12 (twelve) hours. 10 tablet 0  . vitamin E (VITAMIN E) 1000 UNIT  capsule Take 1,000 Units by mouth daily.    [name] warfarin (COUMADIN) 10 MG tablet Take 10 mg by mouth as directed.    . warfarin (COUMADIN) 5 MG tablet TAKE AS DIRECTED BY COUMADIN CLINIC 50 tablet 3   No current facility-administered medications for this visit.    No Known Allergies  Review of Systems negative except from HPI and PMH  Physical Exam BP 134/72 mmHg  Pulse 83  Ht 5\' 9"  (1.753 m)  Wt 204 lb 12.8 oz (92.897 kg)  BMI 30.23 kg/m2 Well developed and nourished in no acute distress HENT normal Neck supple with JVP-flat Clear Device pocket well healed; without hematoma or erythema.  There is no tethering Regular rate and rhythm, 2/6 murmurAbd-soft with active BS No Clubbing cyanosis B prothesis Skin-warm and dry A & Oriented  Grossly normal sensory and motor function   ECG demonstrates P synchronous pacing  Assessment and  Plan  Atrial fibrillation-paroxysmal  Congestive heart failure-chronic-systolic  Nonischemic cardio myopathy  Implantable defibrillator-CRT-[employer]   The patient's device was interrogated.  The information was reviewed. No changes were made in the programming.     On warfarin  No intercurrent atrial fibrillation or flutter  Euvolemic continue current meds  Will begin entresto 24/26  BMET ok 5/16

## 2015-10-31 NOTE — Patient Instructions (Signed)
Medication Instructions: 1) Start Entresto 24/26 mg one tablet by mouth twice daily  Labwork: - Your physician recommends that you return for lab work in: 2 weeks- BMP  Procedures/Testing: - none  Follow-Up: - Your physician recommends that you schedule a follow-up appointment in: 3 months with Tommye Standard, PA for Dr. Caryl Comes.  Any Additional Special Instructions Will Be Listed Below (If Applicable).     If you need a refill on your cardiac medications before your next appointment, please call your pharmacy.

## 2015-11-01 LAB — CUP PACEART INCLINIC DEVICE CHECK
Date Time Interrogation Session: 20170309073425
Implantable Lead Implant Date: 20050429
Implantable Lead Implant Date: 20050429
Implantable Lead Location: 753859
Implantable Lead Location: 753860
Implantable Lead Model: 5076
Implantable Lead Serial Number: 154821
MDC IDC LEAD IMPLANT DT: 20050429
MDC IDC LEAD LOCATION: 753858
MDC IDC LEAD MODEL: 158
MDC IDC LEAD MODEL: 4513
MDC IDC LEAD SERIAL: 406736
MDC IDC PG SERIAL: 587847

## 2015-11-06 ENCOUNTER — Telehealth: Payer: Self-pay

## 2015-11-06 NOTE — Telephone Encounter (Signed)
Prior auth for [employer] 24-26mg  sent to [facility].

## 2015-11-07 ENCOUNTER — Telehealth: Payer: Self-pay

## 2015-11-07 NOTE — Telephone Encounter (Signed)
Entresto approved by [employer].

## 2015-11-14 ENCOUNTER — Ambulatory Visit (INDEPENDENT_AMBULATORY_CARE_PROVIDER_SITE_OTHER): Payer: Medicare Other | Admitting: *Deleted

## 2015-11-14 ENCOUNTER — Other Ambulatory Visit (INDEPENDENT_AMBULATORY_CARE_PROVIDER_SITE_OTHER): Payer: Medicare Other | Admitting: *Deleted

## 2015-11-14 DIAGNOSIS — Z5181 Encounter for therapeutic drug level monitoring: Secondary | ICD-10-CM

## 2015-11-14 DIAGNOSIS — I4891 Unspecified atrial fibrillation: Secondary | ICD-10-CM | POA: Diagnosis not present

## 2015-11-14 DIAGNOSIS — I480 Paroxysmal atrial fibrillation: Secondary | ICD-10-CM

## 2015-11-14 DIAGNOSIS — I428 Other cardiomyopathies: Secondary | ICD-10-CM

## 2015-11-14 DIAGNOSIS — I5022 Chronic systolic (congestive) heart failure: Secondary | ICD-10-CM

## 2015-11-14 LAB — BASIC METABOLIC PANEL
BUN: 28 mg/dL — AB (ref 7–25)
CALCIUM: 8.6 mg/dL (ref 8.6–10.3)
CO2: 21 mmol/L (ref 20–31)
Chloride: 109 mmol/L (ref 98–110)
Creat: 1.36 mg/dL — ABNORMAL HIGH (ref 0.70–1.18)
GLUCOSE: 159 mg/dL — AB (ref 65–99)
Potassium: 4.5 mmol/L (ref 3.5–5.3)
SODIUM: 140 mmol/L (ref 135–146)

## 2015-11-14 LAB — POCT INR: INR: 1.5

## 2015-11-15 ENCOUNTER — Other Ambulatory Visit: Payer: Self-pay | Admitting: *Deleted

## 2015-11-15 ENCOUNTER — Other Ambulatory Visit: Payer: Self-pay | Admitting: Internal Medicine

## 2015-11-21 ENCOUNTER — Ambulatory Visit (INDEPENDENT_AMBULATORY_CARE_PROVIDER_SITE_OTHER): Payer: Medicare Other | Admitting: *Deleted

## 2015-11-21 DIAGNOSIS — I4891 Unspecified atrial fibrillation: Secondary | ICD-10-CM

## 2015-11-21 DIAGNOSIS — Z5181 Encounter for therapeutic drug level monitoring: Secondary | ICD-10-CM | POA: Diagnosis not present

## 2015-11-21 DIAGNOSIS — I480 Paroxysmal atrial fibrillation: Secondary | ICD-10-CM | POA: Diagnosis not present

## 2015-11-21 LAB — POCT INR: INR: 1.8

## 2015-12-05 ENCOUNTER — Ambulatory Visit (INDEPENDENT_AMBULATORY_CARE_PROVIDER_SITE_OTHER): Payer: Medicare Other | Admitting: *Deleted

## 2015-12-05 DIAGNOSIS — I480 Paroxysmal atrial fibrillation: Secondary | ICD-10-CM

## 2015-12-05 DIAGNOSIS — I4891 Unspecified atrial fibrillation: Secondary | ICD-10-CM | POA: Diagnosis not present

## 2015-12-05 DIAGNOSIS — Z5181 Encounter for therapeutic drug level monitoring: Secondary | ICD-10-CM

## 2015-12-05 LAB — POCT INR: INR: 1.8

## 2015-12-20 ENCOUNTER — Ambulatory Visit (INDEPENDENT_AMBULATORY_CARE_PROVIDER_SITE_OTHER): Payer: Medicare Other | Admitting: *Deleted

## 2015-12-20 DIAGNOSIS — I4891 Unspecified atrial fibrillation: Secondary | ICD-10-CM

## 2015-12-20 DIAGNOSIS — Z5181 Encounter for therapeutic drug level monitoring: Secondary | ICD-10-CM | POA: Diagnosis not present

## 2015-12-20 DIAGNOSIS — I480 Paroxysmal atrial fibrillation: Secondary | ICD-10-CM | POA: Diagnosis not present

## 2015-12-20 LAB — POCT INR: INR: 2.5

## 2015-12-27 ENCOUNTER — Emergency Department (HOSPITAL_COMMUNITY): Payer: Medicare Other

## 2015-12-27 ENCOUNTER — Encounter (HOSPITAL_COMMUNITY): Payer: Self-pay

## 2015-12-27 ENCOUNTER — Inpatient Hospital Stay (HOSPITAL_COMMUNITY)
Admission: EM | Admit: 2015-12-27 | Discharge: 2016-01-02 | DRG: 470 | Disposition: A | Discharge status: Skilled Nursing Facility | Payer: Medicare Other | Attending: Internal Medicine | Admitting: Internal Medicine

## 2015-12-27 DIAGNOSIS — I428 Other cardiomyopathies: Secondary | ICD-10-CM | POA: Diagnosis present

## 2015-12-27 DIAGNOSIS — K219 Gastro-esophageal reflux disease without esophagitis: Secondary | ICD-10-CM | POA: Diagnosis present

## 2015-12-27 DIAGNOSIS — M79605 Pain in left leg: Secondary | ICD-10-CM

## 2015-12-27 DIAGNOSIS — D638 Anemia in other chronic diseases classified elsewhere: Secondary | ICD-10-CM | POA: Insufficient documentation

## 2015-12-27 DIAGNOSIS — E1140 Type 2 diabetes mellitus with diabetic neuropathy, unspecified: Secondary | ICD-10-CM | POA: Diagnosis present

## 2015-12-27 DIAGNOSIS — Z794 Long term (current) use of insulin: Secondary | ICD-10-CM

## 2015-12-27 DIAGNOSIS — I5042 Chronic combined systolic (congestive) and diastolic (congestive) heart failure: Secondary | ICD-10-CM | POA: Diagnosis present

## 2015-12-27 DIAGNOSIS — E119 Type 2 diabetes mellitus without complications: Secondary | ICD-10-CM

## 2015-12-27 DIAGNOSIS — Z89512 Acquired absence of left leg below knee: Secondary | ICD-10-CM

## 2015-12-27 DIAGNOSIS — Z96649 Presence of unspecified artificial hip joint: Secondary | ICD-10-CM | POA: Insufficient documentation

## 2015-12-27 DIAGNOSIS — R339 Retention of urine, unspecified: Secondary | ICD-10-CM | POA: Insufficient documentation

## 2015-12-27 DIAGNOSIS — Z7982 Long term (current) use of aspirin: Secondary | ICD-10-CM

## 2015-12-27 DIAGNOSIS — Z89511 Acquired absence of right leg below knee: Secondary | ICD-10-CM

## 2015-12-27 DIAGNOSIS — I10 Essential (primary) hypertension: Secondary | ICD-10-CM | POA: Diagnosis present

## 2015-12-27 DIAGNOSIS — Z7902 Long term (current) use of antithrombotics/antiplatelets: Secondary | ICD-10-CM

## 2015-12-27 DIAGNOSIS — N183 Chronic kidney disease, stage 3 unspecified: Secondary | ICD-10-CM | POA: Diagnosis present

## 2015-12-27 DIAGNOSIS — E860 Dehydration: Secondary | ICD-10-CM | POA: Diagnosis not present

## 2015-12-27 DIAGNOSIS — I739 Peripheral vascular disease, unspecified: Secondary | ICD-10-CM | POA: Diagnosis present

## 2015-12-27 DIAGNOSIS — K5900 Constipation, unspecified: Secondary | ICD-10-CM | POA: Diagnosis present

## 2015-12-27 DIAGNOSIS — F0390 Unspecified dementia without behavioral disturbance: Secondary | ICD-10-CM | POA: Insufficient documentation

## 2015-12-27 DIAGNOSIS — E7800 Pure hypercholesterolemia, unspecified: Secondary | ICD-10-CM | POA: Diagnosis present

## 2015-12-27 DIAGNOSIS — N179 Acute kidney failure, unspecified: Secondary | ICD-10-CM | POA: Diagnosis present

## 2015-12-27 DIAGNOSIS — S72002A Fracture of unspecified part of neck of left femur, initial encounter for closed fracture: Secondary | ICD-10-CM | POA: Diagnosis not present

## 2015-12-27 DIAGNOSIS — Y92007 Garden or yard of unspecified non-institutional (private) residence as the place of occurrence of the external cause: Secondary | ICD-10-CM

## 2015-12-27 DIAGNOSIS — E785 Hyperlipidemia, unspecified: Secondary | ICD-10-CM | POA: Diagnosis present

## 2015-12-27 DIAGNOSIS — E1142 Type 2 diabetes mellitus with diabetic polyneuropathy: Secondary | ICD-10-CM | POA: Diagnosis present

## 2015-12-27 DIAGNOSIS — E559 Vitamin D deficiency, unspecified: Secondary | ICD-10-CM | POA: Diagnosis present

## 2015-12-27 DIAGNOSIS — W010XXA Fall on same level from slipping, tripping and stumbling without subsequent striking against object, initial encounter: Secondary | ICD-10-CM | POA: Diagnosis present

## 2015-12-27 DIAGNOSIS — E1151 Type 2 diabetes mellitus with diabetic peripheral angiopathy without gangrene: Secondary | ICD-10-CM | POA: Diagnosis present

## 2015-12-27 DIAGNOSIS — I480 Paroxysmal atrial fibrillation: Secondary | ICD-10-CM | POA: Diagnosis present

## 2015-12-27 DIAGNOSIS — D696 Thrombocytopenia, unspecified: Secondary | ICD-10-CM | POA: Insufficient documentation

## 2015-12-27 DIAGNOSIS — Z95810 Presence of automatic (implantable) cardiac defibrillator: Secondary | ICD-10-CM | POA: Diagnosis present

## 2015-12-27 DIAGNOSIS — Z8249 Family history of ischemic heart disease and other diseases of the circulatory system: Secondary | ICD-10-CM

## 2015-12-27 DIAGNOSIS — I2510 Atherosclerotic heart disease of native coronary artery without angina pectoris: Secondary | ICD-10-CM | POA: Diagnosis present

## 2015-12-27 DIAGNOSIS — Z833 Family history of diabetes mellitus: Secondary | ICD-10-CM

## 2015-12-27 DIAGNOSIS — I130 Hypertensive heart and chronic kidney disease with heart failure and stage 1 through stage 4 chronic kidney disease, or unspecified chronic kidney disease: Secondary | ICD-10-CM | POA: Diagnosis present

## 2015-12-27 DIAGNOSIS — I70229 Atherosclerosis of native arteries of extremities with rest pain, unspecified extremity: Secondary | ICD-10-CM | POA: Diagnosis present

## 2015-12-27 DIAGNOSIS — I447 Left bundle-branch block, unspecified: Secondary | ICD-10-CM | POA: Diagnosis present

## 2015-12-27 DIAGNOSIS — Z7901 Long term (current) use of anticoagulants: Secondary | ICD-10-CM

## 2015-12-27 DIAGNOSIS — Z87442 Personal history of urinary calculi: Secondary | ICD-10-CM

## 2015-12-27 DIAGNOSIS — W19XXXA Unspecified fall, initial encounter: Secondary | ICD-10-CM

## 2015-12-27 DIAGNOSIS — D62 Acute posthemorrhagic anemia: Secondary | ICD-10-CM | POA: Diagnosis not present

## 2015-12-27 DIAGNOSIS — E1122 Type 2 diabetes mellitus with diabetic chronic kidney disease: Secondary | ICD-10-CM | POA: Diagnosis present

## 2015-12-27 DIAGNOSIS — I998 Other disorder of circulatory system: Secondary | ICD-10-CM | POA: Diagnosis present

## 2015-12-27 HISTORY — DX: Chronic kidney disease, stage 3 (moderate): N18.3

## 2015-12-27 HISTORY — DX: Chronic kidney disease, stage 3 unspecified: N18.30

## 2015-12-27 LAB — I-STAT CHEM 8, ED
BUN: 32 mg/dL — AB (ref 6–20)
CHLORIDE: 106 mmol/L (ref 101–111)
CREATININE: 1.4 mg/dL — AB (ref 0.61–1.24)
Calcium, Ion: 1.19 mmol/L (ref 1.13–1.30)
GLUCOSE: 301 mg/dL — AB (ref 65–99)
HCT: 42 % (ref 39.0–52.0)
Hemoglobin: 14.3 g/dL (ref 13.0–17.0)
POTASSIUM: 4.4 mmol/L (ref 3.5–5.1)
Sodium: 142 mmol/L (ref 135–145)
TCO2: 22 mmol/L (ref 0–100)

## 2015-12-27 LAB — CBC
HEMATOCRIT: 37.1 % — AB (ref 39.0–52.0)
HEMOGLOBIN: 12.1 g/dL — AB (ref 13.0–17.0)
MCH: 24.8 pg — ABNORMAL LOW (ref 26.0–34.0)
MCHC: 32.6 g/dL (ref 30.0–36.0)
MCV: 76 fL — AB (ref 78.0–100.0)
Platelets: 177 10*3/uL (ref 150–400)
RBC: 4.88 MIL/uL (ref 4.22–5.81)
RDW: 16.6 % — AB (ref 11.5–15.5)
WBC: 7.4 10*3/uL (ref 4.0–10.5)

## 2015-12-27 MED ORDER — ONDANSETRON HCL 4 MG/2ML IJ SOLN
4.0000 mg | Freq: Once | INTRAMUSCULAR | Status: AC
  Administered 2015-12-27: 4 mg via INTRAVENOUS
  Filled 2015-12-27: qty 2

## 2015-12-27 MED ORDER — SODIUM CHLORIDE 0.9 % IV SOLN
Freq: Once | INTRAVENOUS | Status: AC
  Administered 2015-12-27: 23:00:00 via INTRAVENOUS

## 2015-12-27 MED ORDER — HYDROMORPHONE HCL 1 MG/ML IJ SOLN
1.0000 mg | Freq: Once | INTRAMUSCULAR | Status: AC
  Administered 2015-12-27: 1 mg via INTRAVENOUS
  Filled 2015-12-27: qty 1

## 2015-12-27 NOTE — ED Notes (Signed)
Patient transported to CT/XR ?

## 2015-12-27 NOTE — ED Notes (Signed)
Pt here with c/o left hip pain after falling today. He reports he was in his garden, slipped and fell. Denies head injury or LOC. Bilateral BKA.

## 2015-12-27 NOTE — ED Provider Notes (Signed)
CSN: [id number]     Arrival date & time 12/27/15  1721 History   First MD Initiated Contact with Patient 12/27/15 2115     Chief Complaint  Patient presents with  . Fall  . Hip Pain     (Consider location/radiation/quality/duration/timing/severity/associated sxs/prior Treatment) HPI  70 year old male with past medical history of cardiomyopathy, hypertension, hyperlipidemia, peripheral vascular disease status post bilateral BKA's who presents with left hip pain. The patient was in his garden earlier today using his walker when he tripped and fell. He landed directly on his left hip. He is unable to get up and had asked his wife and neighbor for help. Since then, he has had severe, 10 out of 10 left hip pain. The pain is made worse with any movement or palpation. No alleviating factors. Denies any numbness or weakness distal to the injury. States he did not hit his chest or head but does use Coumadin. Denies any neck or back pain.   Past Medical History  Diagnosis Date  . Cardiomyopathy, nonischemic ([place])     a. Cath 2003: mild nonobstructive CAD, EF 25% at that time.  [name] PAF (paroxysmal atrial fibrillation) ([place])     a. Noted on ICD interrogation 2012;  b. coumadin d/c'd 01/2013.  [name] NSVT (nonsustained ventricular tachycardia) ([place])     a. Noted on ICD interrogation in 2011.  . High cholesterol     takes Atorvastatin daily  . PAD (peripheral artery disease) ([place])     a. 08/2013 Periph Angio/PTA: Abd Ao nl, RLE- 3v runoff, PT diff dzs, AT 90p, LLE 2v runoff, PT 100, AT 43m (diamondback ORA/chocolate balloon PTA).  . Automatic implantable cardioverter-defibrillator in situ     a. 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  . Dementia   . Arthritis   . History of blood transfusion     no abnormal reaction noted  . Complication of anesthesia   . PONV (postoperative nausea and vomiting)   . Hypertension     takes Coreg daily  . LBBB (left bundle branch block)      takes Coumadin daily  . Anemia     takes Iron pill daily  . Constipation     takes Sennokot daily  . GERD (gastroesophageal reflux disease)     takes Nexium daily  . Chronic systolic CHF (congestive heart failure) (HCC)     takes Furosemide daily as well as Aldactone  . Pneumonia     hx of > 42yr ago  . History of bronchitis     > 1 yr ago  . Headache     occasionally  . Peripheral neuropathy (HCC)     hands;numbness and tingling   . Urinary frequency   . Urinary urgency   . History of kidney stones   . Type II diabetes mellitus ([place])     takes Levemir daily as well as Novolog  . CAD (coronary artery disease)    Past Surgical History  Procedure Laterality Date  . Lithotripsy  2001  . Cervical spine surgery  1994  . Cardiac defibrillator placement  06/2009    WITH GENERATOR REPLACED; BiV ICD  . US echocardiography  03/21/2008    EF 30-35%  . Cardiovascular stress test  03/20/2009    EF 33%  . Transluminal atherectomy tibial artery Left 09/12/2013  . Toe amputation  10/04/2013    LEFT GREAT TOE AND 4TH TOE   /   DR Erlinda Hong  . Amputation Left 10/04/2013  Procedure: LEFT GREAT TOE AND SECOND TOE AMPUTATION;  Surgeon: Marianna Payment, MD;  Location: [place];  Service: Orthopedics;  Laterality: Left;  . Colonoscopy    . Leg amputation below knee Left 11/09/2013    DR Erlinda Hong  . Amputation Left 11/09/2013    Procedure: LEFT AMPUTATION BELOW KNEE;  Surgeon: Marianna Payment, MD;  Location: [place];  Service: Orthopedics;  Laterality: Left;  . Amputation Right 01/04/2014    Procedure: Doristine Devoid second and third toe amputation;  Surgeon: Marianna Payment, MD;  Location: [place];  Service: Orthopedics;  Laterality: Right;  . Amputation Right 01/30/2014    Procedure: RIGHT BELOW KNEE AMPUTATION;  Surgeon: Marianna Payment, MD;  Location: [place];  Service: Orthopedics;  Laterality: Right;  . Lower extremity angiogram Bilateral 09/12/2013    Procedure: LOWER EXTREMITY ANGIOGRAM;  Surgeon: Lorretta Harp, MD;  Location: [facility] CATH LAB;  Service: Cardiovascular;  Laterality: Bilateral;  . Abdominal angiogram  09/12/2013    Procedure: ABDOMINAL ANGIOGRAM;  Surgeon: Lorretta Harp, MD;  Location: [facility] CATH LAB;  Service: Cardiovascular;;  . Lower extremity angiogram N/A 12/29/2013    Procedure: LOWER EXTREMITY ANGIOGRAM;  Surgeon: Lorretta Harp, MD;  Location: [facility] CATH LAB;  Service: Cardiovascular;  Laterality: N/A;  . Atherectomy Right 12/29/2013    Procedure: ATHERECTOMY;  Surgeon: Lorretta Harp, MD;  Location: [facility] CATH LAB;  Service: Cardiovascular;  Laterality: Right;  Anterior Tibial Artery  . Cardiac catheterization  10/01/2001    THERE WAS GLOBAL HYPOKINESIS AND EF 25%. THERE APPEARED TO BE GLOBAL DECREASE IN WALL MOTION  . Esophagogastroduodenoscopy    . Stump revision Left 01/03/2015    Procedure: LEFT BELOW KNEE AMPUTATION REVISION;  Surgeon: Leandrew Koyanagi, MD;  Location: [place];  Service: Orthopedics;  Laterality: Left;   Family History  Problem Relation Age of Onset  . Heart disease Mother   . Hypertension Mother   . Diabetes Mother   . Diabetes Father    Social History  Substance Use Topics  . Smoking status: Never Smoker   . Smokeless tobacco: Never Used  . Alcohol Use: No    Review of Systems  Constitutional: Negative for fever, chills and fatigue.  HENT: Negative for congestion and rhinorrhea.   Eyes: Negative for visual disturbance.  Respiratory: Negative for cough, shortness of breath and wheezing.   Cardiovascular: Negative for chest pain and leg swelling.  Gastrointestinal: Negative for nausea, abdominal pain and diarrhea.  Genitourinary: Negative for dysuria and flank pain.  Musculoskeletal: Positive for gait problem. Negative for neck pain.  Skin: Negative for rash.  Allergic/Immunologic: Negative for immunocompromised state.  Neurological: Negative for syncope, weakness and headaches.  Hematological: Bruises/bleeds easily.      Allergies  Review of  patient's allergies indicates no known allergies.  Home Medications   Prior to Admission medications   Medication Sig Start Date End Date Taking? Authorizing Provider  atorvastatin (LIPITOR) 10 MG tablet Take 1 tablet (10 mg total) by mouth daily. 01/15/15  Yes Daniel J Angiulli, PA-C  carvedilol (COREG) 12.5 MG tablet Take 1 tablet (12.5 mg total) by mouth 2 (two) times daily with a meal. 01/15/15  Yes Daniel J Angiulli, PA-C  cholecalciferol (VITAMIN D) 1000 UNITS tablet Take 1 tablet (1,000 Units total) by mouth daily. 01/15/15  Yes Daniel J Angiulli, PA-C  clopidogrel (PLAVIX) 75 MG tablet Take 1 tablet (75 mg total) by mouth daily with breakfast. 01/15/15  Yes Lavon Paganini Angiulli, PA-C  esomeprazole (NEXIUM) 40 MG capsule Take 1 capsule (40 mg total) by mouth daily as needed (acid reflux). 01/15/15  Yes Daniel J Angiulli, PA-C  furosemide (LASIX) 40 MG tablet Take 1 tablet (40 mg total) by mouth 2 (two) times daily. 01/15/15  Yes Daniel J Angiulli, PA-C  insulin detemir (LEVEMIR) 100 UNIT/ML injection 30 unit AM and 40 unit PM 01/15/15  Yes Daniel J Angiulli, PA-C  iron polysaccharides (NIFEREX) 150 MG capsule Take 1 capsule (150 mg total) by mouth daily. 01/15/15  Yes Daniel J Angiulli, PA-C  Multiple Vitamin (MULTIVITAMIN WITH MINERALS) TABS tablet Take 1 tablet by mouth daily.   Yes Historical Provider, MD  potassium chloride SA (K-DUR,KLOR-CON) 20 MEQ tablet Take 1 tablet (20 mEq total) by mouth 2 (two) times daily. 02/13/14  Yes Daniel J Angiulli, PA-C  pregabalin (LYRICA) 75 MG capsule Take 1 capsule (75 mg total) by mouth 2 (two) times daily. 01/15/15  Yes Daniel J Angiulli, PA-C  sacubitril-valsartan (ENTRESTO) 24-26 MG Take 1 tablet by mouth 2 (two) times daily. 10/31/15  Yes Deboraha Sprang, MD  senna-docusate (SENOKOT-S) 8.6-50 MG per tablet Take 1 tablet by mouth 2 (two) times daily. 11/28/13  Yes Ivan Anchors Love, PA-C  spironolactone (ALDACTONE) 25 MG tablet Take 0.5 tablets (12.5 mg total) by  mouth at bedtime. 01/15/15  Yes Daniel J Angiulli, PA-C  vitamin E (VITAMIN E) 1000 UNIT capsule Take 1,000 Units by mouth daily.   Yes Historical Provider, MD  warfarin (COUMADIN) 10 MG tablet TAKE AS DIRECTED BY COUMADIN CLINIC 11/15/15  Yes Deboraha Sprang, MD  warfarin (COUMADIN) 5 MG tablet TAKE AS DIRECTED BY COUMADIN CLINIC 08/08/15  Yes Deboraha Sprang, MD  aspirin 81 MG EC tablet Take 1 tablet (81 mg total) by mouth daily. 10/24/14   Deboraha Sprang, MD  sulfamethoxazole-trimethoprim (BACTRIM DS,SEPTRA DS) 800-160 MG per tablet Take 1 tablet by mouth every 12 (twelve) hours. 01/15/15   Daniel J Angiulli, PA-C   BP 132/61 mmHg  Pulse 97  Temp(Src) 98.1 F (36.7 C) (Oral)  Resp 16  SpO2 98% Physical Exam  Constitutional: He is oriented to person, place, and time. He appears well-developed and well-nourished. No distress.  HENT:  Head: Normocephalic.  Mouth/Throat: No oropharyngeal exudate.  Eyes: Conjunctivae are normal. Pupils are equal, round, and reactive to light.  Neck: Normal range of motion. Neck supple.  Cardiovascular: Normal heart sounds and intact distal pulses.   Pulmonary/Chest: Effort normal and breath sounds normal. No respiratory distress. He has no wheezes. He has no rales.  Abdominal: Soft. He exhibits no distension. There is no tenderness.  Musculoskeletal: He exhibits no edema.  Neurological: He is alert and oriented to person, place, and time.  Skin: Skin is warm. No rash noted.  Nursing note and vitals reviewed.   LOWER EXTREMITY EXAM: LEFT  INSPECTION & PALPATION: Significant tenderness to palpation over the greater trochanter with pain with any passive range of motion at the hip. Bilateral BKA.  SENSORY: sensation is intact to light touch in:  Medial and lateral aspects of distal lower leg at the level of BKA. Popliteal sensation is intact.  MOTOR:  Antigravity with hip flexion and extension and abduction and abduction although limited due to  pain.  VASCULAR: Palpable popliteal pulse.   ED Course  Procedures (including critical care time) Labs Review Labs Reviewed  CBC  BASIC METABOLIC PANEL  PROTIME-INR  I-STAT CHEM 8, ED  TYPE AND SCREEN    Imaging Review  Dg Chest 1 View  12/27/2015  CLINICAL DATA:  Left hip pain after a fall. Preop for hip fracture. Defibrillator. Slight congestion. Diabetes. Hypertension. EXAM: CHEST 1 VIEW COMPARISON:  01/30/2014 FINDINGS: Cardiac pacemaker. Normal heart size and pulmonary vascularity. No focal airspace disease or consolidation. Slight fibrosis or linear atelectasis in the right lung base. No blunting of costophrenic angles. No pneumothorax. Mediastinal contours appear intact. IMPRESSION: No active disease. Electronically Signed   By: Lucienne Capers M.D.   On: 12/27/2015 23:04   Dg Pelvis 1-2 Views  12/27/2015  CLINICAL DATA:  Left hip pain after a fall. EXAM: LEFT FEMUR 2 VIEWS; PELVIS - 1-2 VIEW COMPARISON:  None. FINDINGS: There is an acute transverse fracture of the left femoral shaft with superior displacement of the distal fracture fragment resulting in varus angulation of the hip. No evidence of dislocation of the hip. Midshaft and distal left femur appear intact. No additional fractures are demonstrated. Pelvis appears intact. No acute fracture or dislocation demonstrated in the pelvis. SI joints and symphysis pubis are not displaced. Visualized right hip appears intact. Diffuse vascular calcifications. IMPRESSION: Acute transverse fracture of the left femoral neck with varus angulation. No pelvic fractures identified. Electronically Signed   By: Lucienne Capers M.D.   On: 12/27/2015 22:56   Ct Head Wo Contrast  12/27/2015  CLINICAL DATA:  [place] while gardening.  Anticoagulated. EXAM: CT HEAD WITHOUT CONTRAST TECHNIQUE: Contiguous axial images were obtained from the base of the skull through the vertex without intravenous contrast. COMPARISON:  03/02/2013 FINDINGS: There is no  intracranial hemorrhage or extra-axial fluid collection. There is moderate generalized atrophy. There is white matter hypodensity which is chronic and likely due to small vessel disease. There is more focally prominent patchy hypodensity in the subcortical white matter of the right frontoparietal convexity, unchanged from 03/02/2013 but possibly representing an area of remote white matter infarction. No acute findings are evident. Calvarium and skullbase are intact. Orbits are unremarkable. IMPRESSION: No acute findings. There is moderate generalized atrophy and chronic white matter hypodensity, likely small vessel disease. Electronically Signed   By: Andreas Newport M.D.   On: 12/27/2015 23:11   Dg Femur Min 2 Views Left  12/27/2015  CLINICAL DATA:  Left hip pain after a fall. EXAM: LEFT FEMUR 2 VIEWS; PELVIS - 1-2 VIEW COMPARISON:  None. FINDINGS: There is an acute transverse fracture of the left femoral shaft with superior displacement of the distal fracture fragment resulting in varus angulation of the hip. No evidence of dislocation of the hip. Midshaft and distal left femur appear intact. No additional fractures are demonstrated. Pelvis appears intact. No acute fracture or dislocation demonstrated in the pelvis. SI joints and symphysis pubis are not displaced. Visualized right hip appears intact. Diffuse vascular calcifications. IMPRESSION: Acute transverse fracture of the left femoral neck with varus angulation. No pelvic fractures identified. Electronically Signed   By: Lucienne Capers M.D.   On: 12/27/2015 22:56   I have personally reviewed and evaluated these images and lab results as part of my medical decision-making.   EKG Interpretation None      MDM   70 year old male with extensive past medical history as above who presents with right hip pain after fall from standing while gardening. No head injury or loss of consciousness. Denies any other areas of tenderness. Distal  neurovasculature is intact. No open wounds. Primary concern is possible hip fracture. Will obtain plain films. Otherwise, given his Coumadin use will obtain CT head all the  patient is neurologically intact at this time.  Plain films show displaced left femoral fracture. Discussed with Dr. Ninfa Linden, Orthopedics. Will plan for OR tomorrow afternoon. NPO at midnight. CT head negative. Will admit to medicine.  Clinical Impression: 1. Closed left hip fracture, initial encounter ([place])   2. Leg pain, left   3. Fall   4. Type 2 diabetes mellitus with diabetic neuropathy, with long-term current use of insulin ([place])   5. Anticoagulated on Coumadin     Disposition: Admit  Condition: Stable  Pt seen in conjunction with Dr. Windell Norfolk, MD 12/28/15 0005  Jola Schmidt, MD 12/28/15 [id number]

## 2015-12-28 ENCOUNTER — Inpatient Hospital Stay (HOSPITAL_COMMUNITY): Payer: Medicare Other

## 2015-12-28 ENCOUNTER — Inpatient Hospital Stay (HOSPITAL_COMMUNITY): Payer: Medicare Other | Admitting: Anesthesiology

## 2015-12-28 ENCOUNTER — Encounter (HOSPITAL_COMMUNITY): Payer: Self-pay | Admitting: Internal Medicine

## 2015-12-28 ENCOUNTER — Encounter (HOSPITAL_COMMUNITY)
Admission: EM | Disposition: A | Discharge status: Skilled Nursing Facility | Payer: Self-pay | Source: Home / Self Care | Attending: Internal Medicine

## 2015-12-28 DIAGNOSIS — E1140 Type 2 diabetes mellitus with diabetic neuropathy, unspecified: Secondary | ICD-10-CM | POA: Diagnosis not present

## 2015-12-28 DIAGNOSIS — D696 Thrombocytopenia, unspecified: Secondary | ICD-10-CM | POA: Diagnosis not present

## 2015-12-28 DIAGNOSIS — Z8249 Family history of ischemic heart disease and other diseases of the circulatory system: Secondary | ICD-10-CM | POA: Diagnosis not present

## 2015-12-28 DIAGNOSIS — I5022 Chronic systolic (congestive) heart failure: Secondary | ICD-10-CM | POA: Diagnosis not present

## 2015-12-28 DIAGNOSIS — E860 Dehydration: Secondary | ICD-10-CM | POA: Diagnosis not present

## 2015-12-28 DIAGNOSIS — K5900 Constipation, unspecified: Secondary | ICD-10-CM | POA: Diagnosis present

## 2015-12-28 DIAGNOSIS — W010XXA Fall on same level from slipping, tripping and stumbling without subsequent striking against object, initial encounter: Secondary | ICD-10-CM | POA: Diagnosis present

## 2015-12-28 DIAGNOSIS — E1151 Type 2 diabetes mellitus with diabetic peripheral angiopathy without gangrene: Secondary | ICD-10-CM | POA: Diagnosis present

## 2015-12-28 DIAGNOSIS — Y92007 Garden or yard of unspecified non-institutional (private) residence as the place of occurrence of the external cause: Secondary | ICD-10-CM | POA: Diagnosis not present

## 2015-12-28 DIAGNOSIS — I428 Other cardiomyopathies: Secondary | ICD-10-CM | POA: Diagnosis present

## 2015-12-28 DIAGNOSIS — E785 Hyperlipidemia, unspecified: Secondary | ICD-10-CM | POA: Diagnosis present

## 2015-12-28 DIAGNOSIS — N183 Chronic kidney disease, stage 3 unspecified: Secondary | ICD-10-CM | POA: Diagnosis present

## 2015-12-28 DIAGNOSIS — I130 Hypertensive heart and chronic kidney disease with heart failure and stage 1 through stage 4 chronic kidney disease, or unspecified chronic kidney disease: Secondary | ICD-10-CM | POA: Diagnosis present

## 2015-12-28 DIAGNOSIS — E7800 Pure hypercholesterolemia, unspecified: Secondary | ICD-10-CM | POA: Diagnosis present

## 2015-12-28 DIAGNOSIS — D638 Anemia in other chronic diseases classified elsewhere: Secondary | ICD-10-CM | POA: Diagnosis not present

## 2015-12-28 DIAGNOSIS — I447 Left bundle-branch block, unspecified: Secondary | ICD-10-CM | POA: Diagnosis present

## 2015-12-28 DIAGNOSIS — S72002A Fracture of unspecified part of neck of left femur, initial encounter for closed fracture: Secondary | ICD-10-CM | POA: Diagnosis present

## 2015-12-28 DIAGNOSIS — Z7901 Long term (current) use of anticoagulants: Secondary | ICD-10-CM | POA: Diagnosis not present

## 2015-12-28 DIAGNOSIS — E1122 Type 2 diabetes mellitus with diabetic chronic kidney disease: Secondary | ICD-10-CM | POA: Diagnosis present

## 2015-12-28 DIAGNOSIS — Z794 Long term (current) use of insulin: Secondary | ICD-10-CM | POA: Diagnosis not present

## 2015-12-28 DIAGNOSIS — Z95810 Presence of automatic (implantable) cardiac defibrillator: Secondary | ICD-10-CM | POA: Diagnosis not present

## 2015-12-28 DIAGNOSIS — I5042 Chronic combined systolic (congestive) and diastolic (congestive) heart failure: Secondary | ICD-10-CM | POA: Diagnosis present

## 2015-12-28 DIAGNOSIS — D62 Acute posthemorrhagic anemia: Secondary | ICD-10-CM | POA: Diagnosis not present

## 2015-12-28 DIAGNOSIS — Z87442 Personal history of urinary calculi: Secondary | ICD-10-CM | POA: Diagnosis not present

## 2015-12-28 DIAGNOSIS — Z7902 Long term (current) use of antithrombotics/antiplatelets: Secondary | ICD-10-CM | POA: Diagnosis not present

## 2015-12-28 DIAGNOSIS — R339 Retention of urine, unspecified: Secondary | ICD-10-CM | POA: Diagnosis not present

## 2015-12-28 DIAGNOSIS — Z89511 Acquired absence of right leg below knee: Secondary | ICD-10-CM | POA: Diagnosis not present

## 2015-12-28 DIAGNOSIS — I480 Paroxysmal atrial fibrillation: Secondary | ICD-10-CM | POA: Diagnosis present

## 2015-12-28 DIAGNOSIS — N179 Acute kidney failure, unspecified: Secondary | ICD-10-CM | POA: Diagnosis not present

## 2015-12-28 DIAGNOSIS — E1142 Type 2 diabetes mellitus with diabetic polyneuropathy: Secondary | ICD-10-CM | POA: Diagnosis present

## 2015-12-28 DIAGNOSIS — F0390 Unspecified dementia without behavioral disturbance: Secondary | ICD-10-CM | POA: Diagnosis present

## 2015-12-28 DIAGNOSIS — E559 Vitamin D deficiency, unspecified: Secondary | ICD-10-CM | POA: Diagnosis present

## 2015-12-28 DIAGNOSIS — I10 Essential (primary) hypertension: Secondary | ICD-10-CM | POA: Diagnosis not present

## 2015-12-28 DIAGNOSIS — Z833 Family history of diabetes mellitus: Secondary | ICD-10-CM | POA: Diagnosis not present

## 2015-12-28 DIAGNOSIS — K219 Gastro-esophageal reflux disease without esophagitis: Secondary | ICD-10-CM | POA: Diagnosis present

## 2015-12-28 DIAGNOSIS — Z7982 Long term (current) use of aspirin: Secondary | ICD-10-CM | POA: Diagnosis not present

## 2015-12-28 DIAGNOSIS — Z89512 Acquired absence of left leg below knee: Secondary | ICD-10-CM | POA: Diagnosis not present

## 2015-12-28 DIAGNOSIS — S72002D Fracture of unspecified part of neck of left femur, subsequent encounter for closed fracture with routine healing: Secondary | ICD-10-CM | POA: Diagnosis not present

## 2015-12-28 DIAGNOSIS — I2510 Atherosclerotic heart disease of native coronary artery without angina pectoris: Secondary | ICD-10-CM | POA: Diagnosis present

## 2015-12-28 DIAGNOSIS — W19XXXA Unspecified fall, initial encounter: Secondary | ICD-10-CM

## 2015-12-28 HISTORY — PX: HIP ARTHROPLASTY: SHX981

## 2015-12-28 LAB — BASIC METABOLIC PANEL
ANION GAP: 11 (ref 5–15)
ANION GAP: 12 (ref 5–15)
BUN: 28 mg/dL — AB (ref 6–20)
BUN: 30 mg/dL — ABNORMAL HIGH (ref 6–20)
CALCIUM: 9 mg/dL (ref 8.9–10.3)
CHLORIDE: 107 mmol/L (ref 101–111)
CHLORIDE: 107 mmol/L (ref 101–111)
CO2: 21 mmol/L — AB (ref 22–32)
CO2: 22 mmol/L (ref 22–32)
CREATININE: 1.41 mg/dL — AB (ref 0.61–1.24)
CREATININE: 1.42 mg/dL — AB (ref 0.61–1.24)
Calcium: 9.3 mg/dL (ref 8.9–10.3)
GFR calc non Af Amer: 48 mL/min — ABNORMAL LOW (ref >60)
GFR, EST AFRICAN AMERICAN: 55 mL/min — AB (ref >60)
GFR, EST AFRICAN AMERICAN: 56 mL/min — AB (ref >60)
GFR, EST NON AFRICAN AMERICAN: 48 mL/min — AB (ref >60)
GLUCOSE: 308 mg/dL — AB (ref 65–99)
Glucose, Bld: 354 mg/dL — ABNORMAL HIGH (ref 65–99)
POTASSIUM: 4.6 mmol/L (ref 3.5–5.1)
Potassium: 4.6 mmol/L (ref 3.5–5.1)
Sodium: 140 mmol/L (ref 135–145)
Sodium: 140 mmol/L (ref 135–145)

## 2015-12-28 LAB — GLUCOSE, CAPILLARY
GLUCOSE-CAPILLARY: 332 mg/dL — AB (ref 65–99)
GLUCOSE-CAPILLARY: 251 mg/dL — AB (ref 65–99)
GLUCOSE-CAPILLARY: 144 mg/dL — AB (ref 65–99)
Glucose-Capillary: 230 mg/dL — ABNORMAL HIGH (ref 65–99)
Glucose-Capillary: 126 mg/dL — ABNORMAL HIGH (ref 65–99)
Glucose-Capillary: 152 mg/dL — ABNORMAL HIGH (ref 65–99)

## 2015-12-28 LAB — APTT: aPTT: 47 seconds — ABNORMAL HIGH (ref 24–37)

## 2015-12-28 LAB — CBC
HCT: 33.9 % — ABNORMAL LOW (ref 39.0–52.0)
HEMATOCRIT: 34.8 % — AB (ref 39.0–52.0)
HEMOGLOBIN: 10.9 g/dL — AB (ref 13.0–17.0)
Hemoglobin: 11.1 g/dL — ABNORMAL LOW (ref 13.0–17.0)
MCH: 25.3 pg — ABNORMAL LOW (ref 26.0–34.0)
MCH: 24.2 pg — AB (ref 26.0–34.0)
MCHC: 32.7 g/dL (ref 30.0–36.0)
MCHC: 31.3 g/dL (ref 30.0–36.0)
MCV: 77.4 fL — ABNORMAL LOW (ref 78.0–100.0)
MCV: 77.2 fL — AB (ref 78.0–100.0)
PLATELETS: 176 10*3/uL (ref 150–400)
PLATELETS: 168 10*3/uL (ref 150–400)
RBC: 4.38 MIL/uL (ref 4.22–5.81)
RBC: 4.51 MIL/uL (ref 4.22–5.81)
RDW: 17 % — ABNORMAL HIGH (ref 11.5–15.5)
RDW: 17 % — ABNORMAL HIGH (ref 11.5–15.5)
WBC: 6.5 10*3/uL (ref 4.0–10.5)
WBC: 9 10*3/uL (ref 4.0–10.5)

## 2015-12-28 LAB — BRAIN NATRIURETIC PEPTIDE: B Natriuretic Peptide: 380.3 pg/mL — ABNORMAL HIGH (ref 0.0–100.0)

## 2015-12-28 LAB — SURGICAL PCR SCREEN
MRSA, PCR: NEGATIVE
Staphylococcus aureus: POSITIVE — AB

## 2015-12-28 LAB — TYPE AND SCREEN
ABO/RH(D): O POS
Antibody Screen: NEGATIVE

## 2015-12-28 LAB — PROTIME-INR
INR: 1.57 — AB (ref 0.00–1.49)
INR: 1.66 — ABNORMAL HIGH (ref 0.00–1.49)
PROTHROMBIN TIME: 18.8 s — AB (ref 11.6–15.2)
Prothrombin Time: 19.7 seconds — ABNORMAL HIGH (ref 11.6–15.2)

## 2015-12-28 SURGERY — HEMIARTHROPLASTY, HIP, DIRECT ANTERIOR APPROACH, FOR FRACTURE
Anesthesia: General | Site: Hip | Laterality: Left

## 2015-12-28 MED ORDER — SUGAMMADEX SODIUM 200 MG/2ML IV SOLN
INTRAVENOUS | Status: DC | PRN
  Administered 2015-12-28: 200 mg via INTRAVENOUS

## 2015-12-28 MED ORDER — OXYCODONE HCL 5 MG PO TABS
ORAL_TABLET | ORAL | Status: AC
  Administered 2015-12-28: 10 mg via ORAL
  Filled 2015-12-28: qty 2

## 2015-12-28 MED ORDER — 0.9 % SODIUM CHLORIDE (POUR BTL) OPTIME
TOPICAL | Status: DC | PRN
  Administered 2015-12-28: 1000 mL

## 2015-12-28 MED ORDER — TRANEXAMIC ACID 1000 MG/10ML IV SOLN
2000.0000 mg | INTRAVENOUS | Status: DC | PRN
Start: 2015-12-28 — End: 2015-12-28
  Administered 2015-12-28: 2000 mg via INTRAVENOUS

## 2015-12-28 MED ORDER — CLOPIDOGREL BISULFATE 75 MG PO TABS
75.0000 mg | ORAL_TABLET | Freq: Every day | ORAL | Status: DC
  Administered 2015-12-30 – 2016-01-02 (×4): 75 mg via ORAL
  Filled 2015-12-28 (×4): qty 1

## 2015-12-28 MED ORDER — PREGABALIN 75 MG PO CAPS
75.0000 mg | ORAL_CAPSULE | Freq: Two times a day (BID) | ORAL | Status: DC
  Administered 2015-12-28 – 2016-01-02 (×11): 75 mg via ORAL
  Filled 2015-12-28 (×12): qty 1

## 2015-12-28 MED ORDER — MUPIROCIN 2 % EX OINT
1.0000 "application " | TOPICAL_OINTMENT | Freq: Two times a day (BID) | CUTANEOUS | Status: AC
  Administered 2015-12-28 – 2016-01-01 (×10): 1 via NASAL
  Filled 2015-12-28: qty 22

## 2015-12-28 MED ORDER — ACETAMINOPHEN 650 MG RE SUPP: 650.0000 mg | Freq: Four times a day (QID) | RECTAL | Status: DC | PRN

## 2015-12-28 MED ORDER — SUGAMMADEX SODIUM 200 MG/2ML IV SOLN
INTRAVENOUS | Status: AC
Start: 2015-12-28 — End: 2015-12-28
  Filled 2015-12-28: qty 2

## 2015-12-28 MED ORDER — ETOMIDATE 2 MG/ML IV SOLN
INTRAVENOUS | Status: DC | PRN
  Administered 2015-12-28: 15 mg via INTRAVENOUS

## 2015-12-28 MED ORDER — MORPHINE SULFATE (PF) 2 MG/ML IV SOLN: 0.5000 mg | INTRAVENOUS | Status: DC | PRN

## 2015-12-28 MED ORDER — PHENYLEPHRINE 40 MCG/ML (10ML) SYRINGE FOR IV PUSH (FOR BLOOD PRESSURE SUPPORT)
PREFILLED_SYRINGE | INTRAVENOUS | Status: AC
Start: 2015-12-28 — End: 2015-12-28
  Filled 2015-12-28: qty 10

## 2015-12-28 MED ORDER — PHENYLEPHRINE 40 MCG/ML (10ML) SYRINGE FOR IV PUSH (FOR BLOOD PRESSURE SUPPORT)
PREFILLED_SYRINGE | INTRAVENOUS | Status: AC
  Filled 2015-12-28: qty 20

## 2015-12-28 MED ORDER — LIDOCAINE 2% (20 MG/ML) 5 ML SYRINGE
INTRAMUSCULAR | Status: AC
  Filled 2015-12-28: qty 5

## 2015-12-28 MED ORDER — TRANEXAMIC ACID 1000 MG/10ML IV SOLN
2000.0000 mg | Freq: Once | INTRAVENOUS | Status: DC
  Filled 2015-12-28: qty 20

## 2015-12-28 MED ORDER — HYDROMORPHONE HCL 1 MG/ML IJ SOLN: 0.2500 mg | INTRAMUSCULAR | Status: DC | PRN

## 2015-12-28 MED ORDER — SODIUM CHLORIDE 0.9 % IR SOLN
Status: DC | PRN
  Administered 2015-12-28: 3000 mL

## 2015-12-28 MED ORDER — SODIUM CHLORIDE 0.9 % IR SOLN
Status: DC | PRN
  Administered 2015-12-28: 1000 mL

## 2015-12-28 MED ORDER — WARFARIN - PHARMACIST DOSING INPATIENT
Freq: Every day | Status: DC
Start: 2015-12-29 — End: 2016-01-02
  Administered 2015-12-29 – 2016-01-01 (×2)

## 2015-12-28 MED ORDER — ACETAMINOPHEN 325 MG PO TABS: 650.0000 mg | ORAL_TABLET | Freq: Four times a day (QID) | ORAL | Status: DC | PRN

## 2015-12-28 MED ORDER — PHENOL 1.4 % MT LIQD: 1.0000 | OROMUCOSAL | Status: DC | PRN

## 2015-12-28 MED ORDER — CEFAZOLIN SODIUM-DEXTROSE 2-4 GM/100ML-% IV SOLN
2.0000 g | INTRAVENOUS | Status: AC
  Administered 2015-12-28: 2 g via INTRAVENOUS
  Filled 2015-12-28 (×2): qty 100

## 2015-12-28 MED ORDER — PANTOPRAZOLE SODIUM 40 MG PO TBEC
40.0000 mg | DELAYED_RELEASE_TABLET | Freq: Every day | ORAL | Status: DC
  Administered 2015-12-29 – 2016-01-02 (×5): 40 mg via ORAL
  Filled 2015-12-28 (×6): qty 1

## 2015-12-28 MED ORDER — HYDROCODONE-ACETAMINOPHEN 5-325 MG PO TABS
1.0000 | ORAL_TABLET | Freq: Four times a day (QID) | ORAL | Status: DC | PRN
  Administered 2015-12-30: 2 via ORAL
  Filled 2015-12-28: qty 2

## 2015-12-28 MED ORDER — LIDOCAINE 2% (20 MG/ML) 5 ML SYRINGE
INTRAMUSCULAR | Status: AC
Start: 2015-12-28 — End: 2015-12-28
  Filled 2015-12-28: qty 5

## 2015-12-28 MED ORDER — ONDANSETRON HCL 4 MG/2ML IJ SOLN
4.0000 mg | Freq: Four times a day (QID) | INTRAMUSCULAR | Status: DC | PRN
  Administered 2015-12-29: 4 mg via INTRAVENOUS
  Filled 2015-12-28: qty 2

## 2015-12-28 MED ORDER — ONDANSETRON HCL 4 MG/2ML IJ SOLN
INTRAMUSCULAR | Status: AC
  Filled 2015-12-28: qty 2

## 2015-12-28 MED ORDER — METOCLOPRAMIDE HCL 5 MG PO TABS: 5.0000 mg | ORAL_TABLET | Freq: Three times a day (TID) | ORAL | Status: DC | PRN

## 2015-12-28 MED ORDER — ENOXAPARIN SODIUM 40 MG/0.4ML ~~LOC~~ SOLN
40.0000 mg | SUBCUTANEOUS | Status: DC
  Administered 2015-12-29 – 2015-12-30 (×2): 40 mg via SUBCUTANEOUS
  Filled 2015-12-28 (×2): qty 0.4

## 2015-12-28 MED ORDER — ADULT MULTIVITAMIN W/MINERALS CH
1.0000 | ORAL_TABLET | Freq: Every day | ORAL | Status: DC
  Administered 2015-12-29 – 2016-01-02 (×4): 1 via ORAL
  Filled 2015-12-28 (×5): qty 1

## 2015-12-28 MED ORDER — ALUM & MAG HYDROXIDE-SIMETH 200-200-20 MG/5ML PO SUSP: 30.0000 mL | ORAL | Status: DC | PRN

## 2015-12-28 MED ORDER — ONDANSETRON HCL 4 MG/2ML IJ SOLN: 4.0000 mg | Freq: Three times a day (TID) | INTRAMUSCULAR | Status: DC | PRN

## 2015-12-28 MED ORDER — MIDAZOLAM HCL 2 MG/2ML IJ SOLN
INTRAMUSCULAR | Status: AC
  Filled 2015-12-28: qty 2

## 2015-12-28 MED ORDER — INSULIN DETEMIR 100 UNIT/ML ~~LOC~~ SOLN
20.0000 [IU] | Freq: Every day | SUBCUTANEOUS | Status: DC
  Administered 2015-12-29 – 2016-01-01 (×4): 20 [IU] via SUBCUTANEOUS
  Filled 2015-12-28 (×5): qty 0.2

## 2015-12-28 MED ORDER — SPIRONOLACTONE 25 MG PO TABS
12.5000 mg | ORAL_TABLET | Freq: Every day | ORAL | Status: DC
  Administered 2015-12-28 – 2015-12-30 (×3): 12.5 mg via ORAL
  Filled 2015-12-28 (×4): qty 1

## 2015-12-28 MED ORDER — CHLORHEXIDINE GLUCONATE CLOTH 2 % EX PADS
6.0000 | MEDICATED_PAD | Freq: Every day | CUTANEOUS | Status: DC
  Administered 2015-12-28 – 2015-12-31 (×4): 6 via TOPICAL

## 2015-12-28 MED ORDER — MEPERIDINE HCL 25 MG/ML IJ SOLN: 6.2500 mg | INTRAMUSCULAR | Status: DC | PRN

## 2015-12-28 MED ORDER — ONDANSETRON HCL 4 MG/2ML IJ SOLN
INTRAMUSCULAR | Status: DC | PRN
  Administered 2015-12-28: 4 mg via INTRAVENOUS

## 2015-12-28 MED ORDER — INSULIN DETEMIR 100 UNIT/ML ~~LOC~~ SOLN
30.0000 [IU] | Freq: Every day | SUBCUTANEOUS | Status: DC
  Administered 2015-12-28 – 2015-12-31 (×5): 30 [IU] via SUBCUTANEOUS
  Filled 2015-12-28 (×6): qty 0.3

## 2015-12-28 MED ORDER — HYDROCODONE-ACETAMINOPHEN 7.5-325 MG PO TABS: 1.0000 | ORAL_TABLET | Freq: Four times a day (QID) | ORAL | Status: DC | PRN

## 2015-12-28 MED ORDER — SODIUM CHLORIDE 0.9% FLUSH
3.0000 mL | Freq: Two times a day (BID) | INTRAVENOUS | Status: DC
  Administered 2015-12-28 – 2016-01-02 (×8): 3 mL via INTRAVENOUS

## 2015-12-28 MED ORDER — CEFAZOLIN SODIUM-DEXTROSE 2-4 GM/100ML-% IV SOLN
2.0000 g | Freq: Four times a day (QID) | INTRAVENOUS | Status: AC
Start: 2015-12-29 — End: 2015-12-29
  Administered 2015-12-29 (×3): 2 g via INTRAVENOUS
  Filled 2015-12-28 (×4): qty 100

## 2015-12-28 MED ORDER — VITAMIN D 1000 UNITS PO TABS
1000.0000 [IU] | ORAL_TABLET | Freq: Every day | ORAL | Status: DC
  Administered 2015-12-29 – 2016-01-02 (×5): 1000 [IU] via ORAL
  Filled 2015-12-28 (×5): qty 1

## 2015-12-28 MED ORDER — ONDANSETRON HCL 4 MG/2ML IJ SOLN
4.0000 mg | Freq: Once | INTRAMUSCULAR | Status: AC | PRN
  Administered 2015-12-28: 4 mg via INTRAVENOUS

## 2015-12-28 MED ORDER — ATORVASTATIN CALCIUM 10 MG PO TABS
10.0000 mg | ORAL_TABLET | Freq: Every day | ORAL | Status: DC
  Administered 2015-12-29 – 2016-01-02 (×5): 10 mg via ORAL
  Filled 2015-12-28 (×5): qty 1

## 2015-12-28 MED ORDER — WARFARIN SODIUM 7.5 MG PO TABS
15.0000 mg | ORAL_TABLET | ORAL | Status: AC
  Administered 2015-12-29: 15 mg via ORAL

## 2015-12-28 MED ORDER — METHOCARBAMOL 1000 MG/10ML IJ SOLN
500.0000 mg | Freq: Four times a day (QID) | INTRAVENOUS | Status: DC | PRN
  Filled 2015-12-28: qty 5

## 2015-12-28 MED ORDER — FENTANYL CITRATE (PF) 100 MCG/2ML IJ SOLN
INTRAMUSCULAR | Status: DC | PRN
  Administered 2015-12-28: 150 ug via INTRAVENOUS
  Administered 2015-12-28 (×2): 50 ug via INTRAVENOUS

## 2015-12-28 MED ORDER — PHENYLEPHRINE HCL 10 MG/ML IJ SOLN
INTRAMUSCULAR | Status: DC | PRN
  Administered 2015-12-28 (×4): 80 ug via INTRAVENOUS

## 2015-12-28 MED ORDER — OXYCODONE HCL 5 MG PO TABS
5.0000 mg | ORAL_TABLET | ORAL | Status: DC | PRN
  Administered 2015-12-28 – 2015-12-29 (×3): 10 mg via ORAL
  Administered 2015-12-30: 5 mg via ORAL
  Filled 2015-12-28: qty 2
  Filled 2015-12-28: qty 1
  Filled 2015-12-28: qty 2

## 2015-12-28 MED ORDER — CHLORHEXIDINE GLUCONATE 4 % EX LIQD
60.0000 mL | Freq: Once | CUTANEOUS | Status: AC
  Administered 2015-12-28: 4 via TOPICAL
  Filled 2015-12-28: qty 60

## 2015-12-28 MED ORDER — METHOCARBAMOL 500 MG PO TABS
500.0000 mg | ORAL_TABLET | Freq: Four times a day (QID) | ORAL | Status: DC | PRN
  Administered 2015-12-29 – 2015-12-30 (×3): 500 mg via ORAL
  Filled 2015-12-28 (×4): qty 1

## 2015-12-28 MED ORDER — LACTATED RINGERS IV SOLN
INTRAVENOUS | Status: DC | PRN
Start: 2015-12-28 — End: 2015-12-28
  Administered 2015-12-28: 16:00:00 via INTRAVENOUS

## 2015-12-28 MED ORDER — ROCURONIUM BROMIDE 100 MG/10ML IV SOLN
INTRAVENOUS | Status: DC | PRN
  Administered 2015-12-28: 10 mg via INTRAVENOUS
  Administered 2015-12-28: 40 mg via INTRAVENOUS

## 2015-12-28 MED ORDER — FUROSEMIDE 40 MG PO TABS: 40.0000 mg | ORAL_TABLET | Freq: Two times a day (BID) | ORAL | Status: DC

## 2015-12-28 MED ORDER — INSULIN DETEMIR 100 UNIT/ML ~~LOC~~ SOLN: 20.0000 [IU] | Freq: Two times a day (BID) | SUBCUTANEOUS | Status: DC

## 2015-12-28 MED ORDER — ASPIRIN EC 81 MG PO TBEC: 81.0000 mg | DELAYED_RELEASE_TABLET | Freq: Every day | ORAL | Status: DC

## 2015-12-28 MED ORDER — INSULIN ASPART 100 UNIT/ML ~~LOC~~ SOLN
0.0000 [IU] | Freq: Three times a day (TID) | SUBCUTANEOUS | Status: DC
  Administered 2015-12-28: 7 [IU] via SUBCUTANEOUS
  Administered 2015-12-28: 5 [IU] via SUBCUTANEOUS
  Administered 2015-12-29: 2 [IU] via SUBCUTANEOUS

## 2015-12-28 MED ORDER — SACUBITRIL-VALSARTAN 24-26 MG PO TABS
1.0000 | ORAL_TABLET | Freq: Two times a day (BID) | ORAL | Status: DC
  Administered 2015-12-29 – 2016-01-02 (×9): 1 via ORAL
  Filled 2015-12-28 (×11): qty 1

## 2015-12-28 MED ORDER — SODIUM CHLORIDE 0.9 % IV SOLN
INTRAVENOUS | Status: DC
  Administered 2015-12-28: 14:00:00 via INTRAVENOUS

## 2015-12-28 MED ORDER — ETOMIDATE 2 MG/ML IV SOLN
INTRAVENOUS | Status: AC
  Filled 2015-12-28: qty 10

## 2015-12-28 MED ORDER — DEXTROSE 5 % IV SOLN
10.0000 mg | INTRAVENOUS | Status: AC
  Administered 2015-12-28: 10 mg via INTRAVENOUS
  Filled 2015-12-28: qty 1

## 2015-12-28 MED ORDER — POVIDONE-IODINE 10 % EX SWAB
2.0000 "application " | Freq: Once | CUTANEOUS | Status: AC
  Administered 2015-12-28: 2 via TOPICAL

## 2015-12-28 MED ORDER — FENTANYL CITRATE (PF) 250 MCG/5ML IJ SOLN
INTRAMUSCULAR | Status: AC
  Filled 2015-12-28: qty 5

## 2015-12-28 MED ORDER — SENNOSIDES-DOCUSATE SODIUM 8.6-50 MG PO TABS
1.0000 | ORAL_TABLET | Freq: Two times a day (BID) | ORAL | Status: DC
  Administered 2015-12-28 – 2016-01-02 (×10): 1 via ORAL
  Filled 2015-12-28 (×12): qty 1

## 2015-12-28 MED ORDER — OXYCODONE-ACETAMINOPHEN 5-325 MG PO TABS
2.0000 | ORAL_TABLET | ORAL | Status: DC | PRN
Start: 2015-12-28 — End: 2016-01-02
  Administered 2015-12-28 – 2016-01-01 (×5): 2 via ORAL
  Filled 2015-12-28 (×5): qty 2

## 2015-12-28 MED ORDER — POLYSACCHARIDE IRON COMPLEX 150 MG PO CAPS
150.0000 mg | ORAL_CAPSULE | Freq: Every day | ORAL | Status: DC
  Administered 2015-12-29 – 2016-01-02 (×5): 150 mg via ORAL
  Filled 2015-12-28 (×6): qty 1

## 2015-12-28 MED ORDER — FUROSEMIDE 40 MG PO TABS
40.0000 mg | ORAL_TABLET | Freq: Two times a day (BID) | ORAL | Status: DC
  Administered 2015-12-28 – 2015-12-30 (×5): 40 mg via ORAL
  Filled 2015-12-28 (×5): qty 1

## 2015-12-28 MED ORDER — CARVEDILOL 12.5 MG PO TABS
12.5000 mg | ORAL_TABLET | Freq: Two times a day (BID) | ORAL | Status: DC
  Administered 2015-12-28 – 2016-01-01 (×7): 12.5 mg via ORAL
  Filled 2015-12-28 (×10): qty 1

## 2015-12-28 MED ORDER — VITAMIN E 45 MG (100 UNIT) PO CAPS
1000.0000 [IU] | ORAL_CAPSULE | Freq: Every day | ORAL | Status: DC
  Administered 2015-12-29 – 2016-01-02 (×5): 1000 [IU] via ORAL
  Filled 2015-12-28 (×6): qty 2

## 2015-12-28 MED ORDER — METOCLOPRAMIDE HCL 5 MG/ML IJ SOLN: 5.0000 mg | Freq: Three times a day (TID) | INTRAMUSCULAR | Status: DC | PRN

## 2015-12-28 MED ORDER — WARFARIN SODIUM 5 MG PO TABS: 5.0000 mg | ORAL_TABLET | Freq: Every day | ORAL | Status: DC

## 2015-12-28 MED ORDER — LACTATED RINGERS IV SOLN
INTRAVENOUS | Status: DC
  Administered 2015-12-28: 16:00:00 via INTRAVENOUS

## 2015-12-28 MED ORDER — ONDANSETRON HCL 4 MG PO TABS
4.0000 mg | ORAL_TABLET | Freq: Four times a day (QID) | ORAL | Status: DC | PRN
Start: 2015-12-28 — End: 2016-01-02

## 2015-12-28 MED ORDER — VITAMIN K1 10 MG/ML IJ SOLN
10.0000 mg | INTRAVENOUS | Status: AC
  Administered 2015-12-28: 10 mg via INTRAVENOUS
  Filled 2015-12-28: qty 1

## 2015-12-28 MED ORDER — PHENYLEPHRINE HCL 10 MG/ML IJ SOLN
10.0000 mg | INTRAVENOUS | Status: DC | PRN
  Administered 2015-12-28: 20 ug/min via INTRAVENOUS

## 2015-12-28 MED ORDER — SODIUM CHLORIDE 0.9 % IV SOLN
INTRAVENOUS | Status: DC
  Administered 2015-12-28: 21:00:00 via INTRAVENOUS

## 2015-12-28 MED ORDER — MENTHOL 3 MG MT LOZG: 1.0000 | LOZENGE | OROMUCOSAL | Status: DC | PRN

## 2015-12-28 SURGICAL SUPPLY — 48 items
BAG DECANTER FOR FLEXI CONT (MISCELLANEOUS) ×2 IMPLANT
BLADE SAGITTAL (BLADE) ×3
BLADE SAW THK.89X75X18XSGTL (BLADE) ×1 IMPLANT
CAPT HIP HEMI 2 ×2 IMPLANT
COVER SURGICAL LIGHT HANDLE (MISCELLANEOUS) ×3 IMPLANT
DRAPE HIP W/POCKET STRL (DRAPE) ×3 IMPLANT
DRAPE IMP U-DRAPE 54X76 (DRAPES) ×3 IMPLANT
DRAPE INCISE IOBAN 85X60 (DRAPES) ×2 IMPLANT
DRAPE ORTHO SPLIT 77X108 STRL (DRAPES) ×6
DRAPE SURG ORHT 6 SPLT 77X108 (DRAPES) ×2 IMPLANT
DRAPE U-SHAPE 47X51 STRL (DRAPES) ×3 IMPLANT
DRESSING ALLEVYN LIFE SACRUM (GAUZE/BANDAGES/DRESSINGS) ×2 IMPLANT
DRSG AQUACEL AG ADV 3.5X10 (GAUZE/BANDAGES/DRESSINGS) IMPLANT
DRSG AQUACEL AG ADV 3.5X14 (GAUZE/BANDAGES/DRESSINGS) ×2 IMPLANT
DRSG MEPILEX BORDER 4X8 (GAUZE/BANDAGES/DRESSINGS) IMPLANT
DURAPREP 26ML APPLICATOR (WOUND CARE) ×6 IMPLANT
ELECT BLADE 4.0 EZ CLEAN MEGAD (MISCELLANEOUS) ×3
ELECT CAUTERY BLADE 6.4 (BLADE) ×3 IMPLANT
ELECT REM PT RETURN 9FT ADLT (ELECTROSURGICAL) ×3
ELECTRODE BLDE 4.0 EZ CLN MEGD (MISCELLANEOUS) IMPLANT
ELECTRODE REM PT RTRN 9FT ADLT (ELECTROSURGICAL) ×1 IMPLANT
EVACUATOR 1/8 PVC DRAIN (DRAIN) IMPLANT
FACESHIELD WRAPAROUND (MASK) IMPLANT
FACESHIELD WRAPAROUND OR TEAM (MASK) IMPLANT
GLOVE SKINSENSE NS SZ7.5 (GLOVE) ×4
GLOVE SKINSENSE STRL SZ7.5 (GLOVE) ×2 IMPLANT
GOWN STRL REIN XL XLG (GOWN DISPOSABLE) ×3 IMPLANT
KIT BASIN OR (CUSTOM PROCEDURE TRAY) ×3 IMPLANT
KIT ROOM TURNOVER OR (KITS) ×3 IMPLANT
MANIFOLD NEPTUNE II (INSTRUMENTS) ×3 IMPLANT
NDL SPNL 18GX3.5 QUINCKE PK (NEEDLE) IMPLANT
NEEDLE SPNL 18GX3.5 QUINCKE PK (NEEDLE) ×3 IMPLANT
NS IRRIG 1000ML POUR BTL (IV SOLUTION) ×3 IMPLANT
PACK TOTAL JOINT (CUSTOM PROCEDURE TRAY) ×3 IMPLANT
PACK UNIVERSAL I (CUSTOM PROCEDURE TRAY) ×3 IMPLANT
PAD ARMBOARD 7.5X6 YLW CONV (MISCELLANEOUS) ×6 IMPLANT
STAPLER VISISTAT 35W (STAPLE) IMPLANT
SUT ETHIBOND 2 V 37 (SUTURE) ×5 IMPLANT
SUT PDS AB 1 CT  36 (SUTURE) ×4
SUT PDS AB 1 CT 36 (SUTURE) ×2 IMPLANT
SUT VIC AB 0 CT1 27 (SUTURE)
SUT VIC AB 0 CT1 27XBRD ANBCTR (SUTURE) IMPLANT
SUT VIC AB 1 CTB1 27 (SUTURE) IMPLANT
SUT VIC AB 2-0 CT1 27 (SUTURE)
SUT VIC AB 2-0 CT1 TAPERPNT 27 (SUTURE) IMPLANT
TOWEL OR 17X24 6PK STRL BLUE (TOWEL DISPOSABLE) ×3 IMPLANT
TOWEL OR 17X26 10 PK STRL BLUE (TOWEL DISPOSABLE) ×3 IMPLANT
YANKAUER SUCT BULB TIP NO VENT (SUCTIONS) ×2 IMPLANT

## 2015-12-28 NOTE — Progress Notes (Addendum)
ANTICOAGULATION CONSULT NOTE - Initial Consult  Pharmacy Consult:  Coumadin Indication:  Afib  No Known Allergies  Patient Measurements: Height: 5\' 9"  (175.3 cm) Weight: 202 lb 2.6 oz (91.7 kg) IBW/kg (Calculated) : 70.7  Vital Signs: Temp: 97.9 F (36.6 C) (05/05 1515) Temp Source: Oral (05/05 0640) BP: 111/59 mmHg (05/05 1515) Pulse Rate: 75 (05/05 1515)  Labs:  Recent Labs  12/27/15 2330 12/27/15 2354 12/28/15 0550 12/28/15 1202  HGB 12.1* 14.3 11.1*  --   HCT 37.1* 42.0 33.9*  --   PLT 177  --  176  --   APTT  --   --  47*  --   LABPROT 19.7*  --   --  18.8*  INR 1.66*  --   --  1.57*  CREATININE 1.42* 1.40* 1.41*  --     Estimated Creatinine Clearance: 53 mL/min (by C-G formula based on Cr of 1.41).   Medical History: Past Medical History  Diagnosis Date  . Cardiomyopathy, nonischemic ([place])     a. Cath 2003: mild nonobstructive CAD, EF 25% at that time.  [name] PAF (paroxysmal atrial fibrillation) ([place])     a. Noted on ICD interrogation 2012;  b. coumadin d/c'd 01/2013.  [name] NSVT (nonsustained ventricular tachycardia) ([place])     a. Noted on ICD interrogation in 2011.  . High cholesterol     takes Atorvastatin daily  . PAD (peripheral artery disease) ([place])     a. 08/2013 Periph Angio/PTA: Abd Ao nl, RLE- 3v runoff, PT diff dzs, AT 90p, LLE 2v runoff, PT 100, AT 72m (diamondback ORA/chocolate balloon PTA).  . Automatic implantable cardioverter-defibrillator in situ     a. 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  . Dementia   . Arthritis   . History of blood transfusion     no abnormal reaction noted  . Complication of anesthesia   . PONV (postoperative nausea and vomiting)   . Hypertension     takes Coreg daily  . LBBB (left bundle branch block)     takes Coumadin daily  . Anemia     takes Iron pill daily  . Constipation     takes Sennokot daily  . GERD (gastroesophageal reflux disease)     takes Nexium daily  . Chronic  systolic CHF (congestive heart failure) (HCC)     takes Furosemide daily as well as Aldactone  . Pneumonia     hx of > 4yr ago  . History of bronchitis     > 1 yr ago  . Headache     occasionally  . Peripheral neuropathy (HCC)     hands;numbness and tingling   . Urinary frequency   . Urinary urgency   . History of kidney stones   . Type II diabetes mellitus ([place])     takes Levemir daily as well as Novolog  . CAD (coronary artery disease)   . PAF (paroxysmal atrial fibrillation) ([place])   . CKD (chronic kidney disease), stage III       Assessment: 70 YOM on Coumadin PTA for history of Afib.  Patient's Coumadin was reversed for open treatment of femoral fracture today 12/28/15.  Pharmacy consulted to resume Coumadin.  His INR is sub-therapeutic and has trended down further.  No bleeding reported.  Home regimen: Coumadin 10mg  daily except 15mg  on Sat, last dose 12/22/15   Goal of Therapy:  INR 2-3    Plan:  - Coumadin 15mg  PO today - Daily PT /  INR    Inesha Sow D. Mina Marble, PharmD, BCPS Pager:  [phone] 12/28/2015, 8:03 PM

## 2015-12-28 NOTE — Progress Notes (Signed)
OT Cancellation Note  Patient Details Name: [name] [name] MRN: [id number] DOB: [birth year]   Cancelled Treatment:    Reason Eval/Treat Not Completed: Medical issues which prohibited therapy (bedrest / pending surg)  Vonita Moss   OTR/L Pager: [phone] Office: [phone] .  12/28/2015, 7:00 AM

## 2015-12-28 NOTE — OR Nursing (Signed)
1835: allevyn sacral pad placed to start sacral protocol with assistance of pacu rn's; skin clear at this time, dryish with whitish center line right over coccyx.

## 2015-12-28 NOTE — Progress Notes (Signed)
Report called to Bridget-RN in Short Stay, all questions/concerns addressed.

## 2015-12-28 NOTE — ED Notes (Signed)
Decreased to 88% after pain medication, placed on 3 liters 

## 2015-12-28 NOTE — H&P (Addendum)
History and Physical    [name] [name] [id number] DOB: [birth year] DOA: 12/27/2015  Referring MD/NP/PA:   PCP: [name], MD   Outpatient Specialists: Cardiologist Dr. Caryl Comes  Patient coming from:  Home   Chief Complaint: Left hip injury after fall  HPI: [name] [name] is a 70 y.o. 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  Patient states that he had a fall when he was walking in the garden using his walker and tripped his steps today. He injured his left hip, and he developed moderate pain in left hip. It is constant, sharp and nonradiating.. No numbness in his legs. Patient denies LOC or head injury. Does not have chest pain, shortness of breath, nausea, vomiting, abdominal pain, symptoms of UTI or unilateral weakness.   ED Course: pt was found to have WBC 7.4, INR 1.66, temperature normal, no tachycardia, no tachypnea, stable renal function, negative CT head of acute intracranial abnormalities. Patient's admitted to inpatient for further evaluation treatment. Orthopedic surgeon was consulted.  Review of Systems:   General: no fevers, chills, no changes in body weight, has fatigue HEENT: no blurry vision, hearing changes or sore throat Pulm: no dyspnea, coughing, wheezing CV: no chest pain, no palpitations Abd: no nausea, vomiting, abdominal pain, diarrhea, constipation GU: no dysuria, burning on urination, increased urinary frequency, hematuria  Ext: s/p bilateral BKA  Neuro: no unilateral weakness, numbness, or tingling, no vision change or hearing loss Skin: no rash MSK: has left hip pain. Heme: No easy bruising.  Travel history: No recent long distant travel.  Allergy: No Known Allergies  Past Medical History  Diagnosis Date  .  Cardiomyopathy, nonischemic ([place])     a. Cath 2003: mild nonobstructive CAD, EF 25% at that time.  [name] PAF (paroxysmal atrial fibrillation) ([place])     a. Noted on ICD interrogation 2012;  b. coumadin d/c'd 01/2013.  [name] NSVT (nonsustained ventricular tachycardia) ([place])     a. Noted on ICD interrogation in 2011.  . High cholesterol     takes Atorvastatin daily  . PAD (peripheral artery disease) ([place])     a. 08/2013 Periph Angio/PTA: Abd Ao nl, RLE- 3v runoff, PT diff dzs, AT 90p, LLE 2v runoff, PT 100, AT 57m (diamondback ORA/chocolate balloon PTA).  . Automatic implantable cardioverter-defibrillator in situ     a. 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  . Dementia   . Arthritis   . History of blood transfusion     no abnormal reaction noted  . Complication of anesthesia   . PONV (postoperative nausea and vomiting)   . Hypertension     takes Coreg daily  . LBBB (left bundle branch block)     takes Coumadin daily  . Anemia     takes Iron pill daily  . Constipation     takes Sennokot daily  . GERD (gastroesophageal reflux disease)     takes Nexium daily  . Chronic systolic CHF (congestive heart failure) (HCC)     takes Furosemide daily as well as Aldactone  . Pneumonia     hx of > 66yr ago  . History of bronchitis     > 1 yr ago  . Headache     occasionally  . Peripheral neuropathy (HCC)     hands;numbness and tingling   .  Urinary frequency   . Urinary urgency   . History of kidney stones   . Type II diabetes mellitus ([place])     takes Levemir daily as well as Novolog  . CAD (coronary artery disease)   . PAF (paroxysmal atrial fibrillation) ([place])   . CKD (chronic kidney disease), stage III     Past Surgical History  Procedure Laterality Date  . Lithotripsy  2001  . Cervical spine surgery  1994  . Cardiac defibrillator placement  06/2009    WITH GENERATOR REPLACED; BiV ICD  . US echocardiography  03/21/2008    EF 30-35%  . Cardiovascular stress  test  03/20/2009    EF 33%  . Transluminal atherectomy tibial artery Left 09/12/2013  . Toe amputation  10/04/2013    LEFT GREAT TOE AND 4TH TOE   /   DR Erlinda Hong  . Amputation Left 10/04/2013    Procedure: LEFT GREAT TOE AND SECOND TOE AMPUTATION;  Surgeon: Marianna Payment, MD;  Location: [facility];  Service: Orthopedics;  Laterality: Left;  . Colonoscopy    . Leg amputation below knee Left 11/09/2013    DR Erlinda Hong  . Amputation Left 11/09/2013    Procedure: LEFT AMPUTATION BELOW KNEE;  Surgeon: Marianna Payment, MD;  Location: [facility];  Service: Orthopedics;  Laterality: Left;  . Amputation Right 01/04/2014    Procedure: Doristine Devoid second and third toe amputation;  Surgeon: Marianna Payment, MD;  Location: [place] [facility];  Service: Orthopedics;  Laterality: Right;  . Amputation Right 01/30/2014    Procedure: RIGHT BELOW KNEE AMPUTATION;  Surgeon: Marianna Payment, MD;  Location: [place];  Service: Orthopedics;  Laterality: Right;  . Lower extremity angiogram Bilateral 09/12/2013    Procedure: LOWER EXTREMITY ANGIOGRAM;  Surgeon: Lorretta Harp, MD;  Location[facility] C[facility] LAB;  Service: Cardiovascular;  Laterality: Bilateral;  . Abdominal angiogram  09/12/2013    Procedure: ABDOMINAL ANGIOGRAM;  Surgeon: Lorretta Harp, MD;  Location: [facility] CATH LAB;  Service: Cardiovascular;;  . Lower extremity angiogram N/A 12/29/2013    Procedure: LOWER EXTREMITY ANGIOGRAM;  Surgeon: Lorretta Harp, MD;  Location: [facility] CATH LAB;  Service: Cardiovascular;  Laterality: N/A;  . Atherectomy Right 12/29/2013    Procedure: ATHERECTOMY;  Surgeon: Lorretta Harp, MD;  Location: [facility] CATH LAB;  Service: Cardiovascular;  Laterality: Right;  Anterior Tibial Artery  . Cardiac catheterization  10/01/2001    THERE WAS GLOBAL HYPOKINESIS AND EF 25%. THERE APPEARED TO BE GLOBAL DECREASE IN WALL MOTION  . Esophagogastroduodenoscopy    . Stump revision Left 01/03/2015    Procedure: LEFT BELOW KNEE AMPUTATION REVISION;  Surgeon: Leandrew Koyanagi, MD;  Location: [place];  Service: Orthopedics;  Laterality: Left;    Social History:  reports that he has never smoked. He has never used smokeless tobacco. He reports that he does not drink alcohol or use illicit drugs.  Family History:  Family History  Problem Relation Age of Onset  . Heart disease Mother   . Hypertension Mother   . Diabetes Mother   . Diabetes Father      Prior to Admission medications   Medication Sig Start Date End Date Taking? Authorizing Provider  atorvastatin (LIPITOR) 10 MG tablet Take 1 tablet (10 mg total) by mouth daily. 01/15/15  Yes Daniel J Angiulli, PA-C  carvedilol (COREG) 12.5 MG tablet Take 1 tablet (12.5 mg total) by mouth 2 (two) times daily with a meal. 01/15/15  Yes Lavon Paganini Angiulli, PA-C  cholecalciferol (VITAMIN D) 1000 UNITS tablet Take 1 tablet (1,000 Units total) by mouth daily. 01/15/15  Yes Daniel J Angiulli, PA-C  clopidogrel (PLAVIX) 75 MG tablet Take 1 tablet (75 mg total) by mouth daily with breakfast. 01/15/15  Yes Lavon Paganini Angiulli, PA-C  esomeprazole (NEXIUM) 40 MG capsule Take 1 capsule (40 mg total) by mouth daily as needed (acid reflux). 01/15/15  Yes Daniel J Angiulli, PA-C  furosemide (LASIX) 40 MG tablet Take 1 tablet (40 mg total) by mouth 2 (two) times daily. 01/15/15  Yes Daniel J Angiulli, PA-C  insulin detemir (LEVEMIR) 100 UNIT/ML injection 30 unit AM and 40 unit PM 01/15/15  Yes Daniel J Angiulli, PA-C  iron polysaccharides (NIFEREX) 150 MG capsule Take 1 capsule (150 mg total) by mouth daily. 01/15/15  Yes Daniel J Angiulli, PA-C  Multiple Vitamin (MULTIVITAMIN WITH MINERALS) TABS tablet Take 1 tablet by mouth daily.   Yes Historical Provider, MD  potassium chloride SA (K-DUR,KLOR-CON) 20 MEQ tablet Take 1 tablet (20 mEq total) by mouth 2 (two) times daily. 02/13/14  Yes Daniel J Angiulli, PA-C  pregabalin (LYRICA) 75 MG capsule Take 1 capsule (75 mg total) by mouth 2 (two) times daily. 01/15/15  Yes Daniel J Angiulli, PA-C  sacubitril-valsartan  (ENTRESTO) 24-26 MG Take 1 tablet by mouth 2 (two) times daily. 10/31/15  Yes Deboraha Sprang, MD  senna-docusate (SENOKOT-S) 8.6-50 MG per tablet Take 1 tablet by mouth 2 (two) times daily. 11/28/13  Yes Ivan Anchors Love, PA-C  spironolactone (ALDACTONE) 25 MG tablet Take 0.5 tablets (12.5 mg total) by mouth at bedtime. 01/15/15  Yes Daniel J Angiulli, PA-C  vitamin E (VITAMIN E) 1000 UNIT capsule Take 1,000 Units by mouth daily.   Yes Historical Provider, MD  warfarin (COUMADIN) 10 MG tablet TAKE AS DIRECTED BY COUMADIN CLINIC 11/15/15  Yes Deboraha Sprang, MD  warfarin (COUMADIN) 5 MG tablet TAKE AS DIRECTED BY COUMADIN CLINIC 08/08/15  Yes Deboraha Sprang, MD  aspirin 81 MG EC tablet Take 1 tablet (81 mg total) by mouth daily. 10/24/14   Deboraha Sprang, MD  sulfamethoxazole-trimethoprim (BACTRIM DS,SEPTRA DS) 800-160 MG per tablet Take 1 tablet by mouth every 12 (twelve) hours. 01/15/15   Cathlyn Parsons, PA-C    Physical Exam: Filed Vitals:   12/27/15 1727 12/27/15 2103 12/28/15 0015  BP: 102/63 132/61   Pulse: 94 97 91  Temp: 98.1 F (36.7 C)    TempSrc: Oral    Resp: 18 16 11   SpO2: 100% 98% 95%   General: Not in acute distress HEENT:       Eyes: PERRL, EOMI, no scleral icterus.       ENT: No discharge from the ears and nose, no pharynx injection, no tonsillar enlargement.        Neck: No JVD, no bruit, no mass felt. Heme: No neck lymph node enlargement. Cardiac: S1/S2, RRR, No murmurs, No gallops or rubs. Pulm: No rales, wheezing, rhonchi or rubs. Abd: Soft, nondistended, nontender, no rebound pain, no organomegaly, BS present. GU: No hematuria Ext: No pitting leg edema bilaterally. S/p of bilateral BKA  Musculoskeletal: has tenderness over left hip Skin: No rashes.  Neuro: Alert, oriented X3, cranial nerves II-XII grossly intact, moves all extremities. Psych: Patient is not psychotic, no suicidal or hemocidal ideation.  Labs on Admission: I have personally reviewed following labs and  imaging studies  CBC:  Recent Labs Lab 12/27/15 2330 12/27/15 2354  WBC 7.4  --   HGB  12.1* 14.3  HCT 37.1* 42.0  MCV 76.0*  --   PLT 177  --    Basic Metabolic Panel:  Recent Labs Lab 12/27/15 2330 12/27/15 2354  NA 140 142  K 4.6 4.4  CL 107 106  CO2 21*  --   GLUCOSE 308* 301*  BUN 30* 32*  CREATININE 1.42* 1.40*  CALCIUM 9.3  --    GFR: CrCl cannot be calculated (Unknown ideal weight.). Liver Function Tests: No results for input(s): AST, ALT, ALKPHOS, BILITOT, PROT, ALBUMIN in the last 168 hours. No results for input(s): LIPASE, AMYLASE in the last 168 hours. No results for input(s): AMMONIA in the last 168 hours. Coagulation Profile:  Recent Labs Lab 12/27/15 2330  INR 1.66*   Cardiac Enzymes: No results for input(s): CKTOTAL, CKMB, CKMBINDEX, TROPONINI in the last 168 hours. BNP (last 3 results) No results for input(s): PROBNP in the last 8760 hours. HbA1C: No results for input(s): HGBA1C in the last 72 hours. CBG: No results for input(s): GLUCAP in the last 168 hours. Lipid Profile: No results for input(s): CHOL, HDL, LDLCALC, TRIG, CHOLHDL, LDLDIRECT in the last 72 hours. Thyroid Function Tests: No results for input(s): TSH, T4TOTAL, FREET4, T3FREE, THYROIDAB in the last 72 hours. Anemia Panel: No results for input(s): VITAMINB12, FOLATE, FERRITIN, TIBC, IRON, RETICCTPCT in the last 72 hours. Urine analysis:    Component Value Date/Time   COLORURINE YELLOW 01/11/2015 1850   APPEARANCEUR TURBID* 01/11/2015 1850   LABSPEC 1.013 01/11/2015 1850   PHURINE 8.5* 01/11/2015 1850   GLUCOSEU NEGATIVE 01/11/2015 1850   HGBUR MODERATE* 01/11/2015 1850   BILIRUBINUR NEGATIVE 01/11/2015 1850   KETONESUR NEGATIVE 01/11/2015 1850   PROTEINUR 100* 01/11/2015 1850   UROBILINOGEN 0.2 01/11/2015 1850   NITRITE NEGATIVE 01/11/2015 1850   LEUKOCYTESUR LARGE* 01/11/2015 1850   Sepsis Labs: @LABRCNTIP (procalcitonin:4,lacticidven:4) )No results found for this  or any previous visit (from the past 240 hour(s)).   Radiological Exams on Admission: Dg Chest 1 View  12/27/2015  CLINICAL DATA:  Left hip pain after a fall. Preop for hip fracture. Defibrillator. Slight congestion. Diabetes. Hypertension. EXAM: CHEST 1 VIEW COMPARISON:  01/30/2014 FINDINGS: Cardiac pacemaker. Normal heart size and pulmonary vascularity. No focal airspace disease or consolidation. Slight fibrosis or linear atelectasis in the right lung base. No blunting of costophrenic angles. No pneumothorax. Mediastinal contours appear intact. IMPRESSION: No active disease. Electronically Signed   By: Lucienne Capers M.D.   On: 12/27/2015 23:04   Dg Pelvis 1-2 Views  12/27/2015  CLINICAL DATA:  Left hip pain after a fall. EXAM: LEFT FEMUR 2 VIEWS; PELVIS - 1-2 VIEW COMPARISON:  None. FINDINGS: There is an acute transverse fracture of the left femoral shaft with superior displacement of the distal fracture fragment resulting in varus angulation of the hip. No evidence of dislocation of the hip. Midshaft and distal left femur appear intact. No additional fractures are demonstrated. Pelvis appears intact. No acute fracture or dislocation demonstrated in the pelvis. SI joints and symphysis pubis are not displaced. Visualized right hip appears intact. Diffuse vascular calcifications. IMPRESSION: Acute transverse fracture of the left femoral neck with varus angulation. No pelvic fractures identified. Electronically Signed   By: Lucienne Capers M.D.   On: 12/27/2015 22:56   Ct Head Wo Contrast  12/27/2015  CLINICAL DATA:  [place] while gardening.  Anticoagulated. EXAM: CT HEAD WITHOUT CONTRAST TECHNIQUE: Contiguous axial images were obtained from the base of the skull through the vertex without intravenous contrast. COMPARISON:  03/02/2013  FINDINGS: There is no intracranial hemorrhage or extra-axial fluid collection. There is moderate generalized atrophy. There is white matter hypodensity which is chronic and  likely due to small vessel disease. There is more focally prominent patchy hypodensity in the subcortical white matter of the right frontoparietal convexity, unchanged from 03/02/2013 but possibly representing an area of remote white matter infarction. No acute findings are evident. Calvarium and skullbase are intact. Orbits are unremarkable. IMPRESSION: No acute findings. There is moderate generalized atrophy and chronic white matter hypodensity, likely small vessel disease. Electronically Signed   By: Andreas Newport M.D.   On: 12/27/2015 23:11   Dg Femur Min 2 Views Left  12/27/2015  CLINICAL DATA:  Left hip pain after a fall. EXAM: LEFT FEMUR 2 VIEWS; PELVIS - 1-2 VIEW COMPARISON:  None. FINDINGS: There is an acute transverse fracture of the left femoral shaft with superior displacement of the distal fracture fragment resulting in varus angulation of the hip. No evidence of dislocation of the hip. Midshaft and distal left femur appear intact. No additional fractures are demonstrated. Pelvis appears intact. No acute fracture or dislocation demonstrated in the pelvis. SI joints and symphysis pubis are not displaced. Visualized right hip appears intact. Diffuse vascular calcifications. IMPRESSION: Acute transverse fracture of the left femoral neck with varus angulation. No pelvic fractures identified. Electronically Signed   By: Lucienne Capers M.D.   On: 12/27/2015 22:56     EKG: Independently reviewed.  QTC 509, PAC.   Assessment/Plan Principal Problem:   Closed left hip fracture (HCC) Active Problems:   SYSTOLIC HEART FAILURE, CHRONIC   HTN (hypertension)   BiV ICD (BS).  ICD in '05, BiV ICD 11/10   Critical lower limb ischemia- s/p Rt anterior tibial PTA 12/29/13 in preparation for Rt BKA   PAD (peripheral artery disease) (HCC)   Diabetes mellitus ([place])   Hyperlipidemia   PAF (paroxysmal atrial fibrillation) ([place])   Diabetes mellitus with neuropathy ([place])   CKD (chronic kidney disease),  stage III   Closed left hip fracture ([place]): As evidenced by x-ray. Patient has moderate pain now. No neurovascular compromise. Orthopedic surgeon was consulted. Dr. Ninfa Linden will see pt in AM, likely do surgery in afternoon per EDP  - will admit to tele bed as inpt - Pain control: prn percocet - follow up ortho recs - NPO after MN - type and cross - INR/PTT -Hold Coumdin and plavix  Chronic combined systolic and diastolic congestive heart failure: 2D echo on 02/15/13 showed EF 20-25 percent with grade 1 diastolic dysfunction. Patient does not have JVD. CHF is compensated on admission. -Continue Lasix and spironolactone -Continue aspirin and Coreg -check BNP  HTN: -continue Lasix, spironolactone, Coreg  PAF: CHA2DS2-VASc Score is 4, needs oral anticoagulation. Patient is on Coumadin at home. INR is 1.66  on admission. Heart rate is well controlled. -Hold Coumadin for surgery -continue coreg  DM-II: Last A1c 8.6 on 11/09/13, poorly controled. Patient is taking Levemir at home -will decrease Levemir dose from 30-40 units twice a day to 20-30 twice a day -SSI  HLD: Last LDL was 98  -Continue home medications: Lipitor  CKD (chronic kidney disease), stage III: Stable. Baseline creatinine 1.3-1.4. His cre is 1.42. -Follow-up renal function by BMP  DVT ppx: none (pt may need surgery, cannot use heparin. Patient has bilateral BKA, cannot use SCD. Code Status: Full code Family Communication: Yes, patient's wife at bed side Disposition Plan:  Anticipate discharge back to previous home environment Consults called:  [place],  Dr. Ninfa Linden Admission status: Inpatient/tele   Date of Service 12/28/2015    Ivor Costa [facility] Pager [phone]  If 7PM-7AM, please contact night-coverage [email or web address] Password [email or web address] 12/28/2015, 12:50 AM

## 2015-12-28 NOTE — Progress Notes (Signed)
Patient admitted after midnight, please see H&P.  For hip surgery today.  Will need PT eval after-- has been to CIR before would like to return. -check Hgba1c -check Vit D  Eulogio Bear DO

## 2015-12-28 NOTE — Transfer of Care (Signed)
Immediate Anesthesia Transfer of Care Note  Patient: [name]  Procedure(s) Performed: Procedure(s): POSTERIOR  APPROACH HEMI HIP ARTHROPLASTY (Left)  Patient Location: PACU  Anesthesia Type:General  Level of Consciousness: awake and alert   Airway & Oxygen Therapy: Patient Spontanous Breathing and Patient connected to face mask oxygen  Post-op Assessment: Report given to RN, Post -op Vital signs reviewed and stable and Patient moving all extremities X 4  Post vital signs: Reviewed and stable  Last Vitals:  Filed Vitals:   12/28/15 1515 12/28/15 1831  BP: 111/59 124/68  Pulse: 75 77  Temp: 36.6 C 36.9 C  Resp: 18 16    Last Pain:  Filed Vitals:   12/28/15 1833  PainSc: 5          Complications: No apparent anesthesia complications

## 2015-12-28 NOTE — Anesthesia Preprocedure Evaluation (Addendum)
Anesthesia Evaluation  Patient identified by MRN, date of birth, ID band Patient awake    Reviewed: Allergy & Precautions, NPO status , Patient's Chart, lab work & pertinent test results  History of Anesthesia Complications (+) PONV and history of anesthetic complications  Airway Mallampati: II  TM Distance: >3 FB Neck ROM: Full    Dental  (+) Teeth Intact, Loose, Dental Advisory Given,    Pulmonary    Pulmonary exam normal breath sounds clear to auscultation       Cardiovascular hypertension, + CAD and +CHF  Normal cardiovascular exam+ dysrhythmias Atrial Fibrillation + Cardiac Defibrillator   ECHO 01/2013  Study Conclusions  - Left ventricle: The cavity size was mildly dilated. Wall thickness was normal. Systolic function was severely reduced. The estimated ejection fraction was in the range of 20% to 25%. Diffuse hypokinesis. Doppler parameters are consistent with abnormal left ventricular relaxation (grade 1 diastolic dysfunction). - Mitral valve: Mild regurgitation. - Left atrium: The atrium was mildly dilated. Transthoracic echocardiography. M-mode, complete 2D, spectral Doppler, and color Doppler. Height: Height: 175.3cm. Height: 69in. Weight: Weight: 83.9kg. Weight: 184.6lb. Body mass index: BMI: 27.3kg/m^2. Body surface area:  BSA: 20m^2. Blood pressure:   100/64. Patient status: Inpatient.    Neuro/Psych    GI/Hepatic   Endo/Other  diabetes, Type 2, Insulin Dependent  Renal/GU      Musculoskeletal   Abdominal   Peds  Hematology   Anesthesia Other Findings   Reproductive/Obstetrics                            Anesthesia Physical Anesthesia Plan  ASA: III  Anesthesia Plan: General   Post-op Pain Management:    Induction: Intravenous  Airway Management Planned: Oral ETT  Additional Equipment:   Intra-op Plan:   Post-operative Plan:  Extubation in OR  Informed Consent: I have reviewed the patients History and Physical, chart, labs and discussed the procedure including the risks, benefits and alternatives for the proposed anesthesia with the patient or authorized representative who has indicated his/her understanding and acceptance.   Dental advisory given  Plan Discussed with: CRNA and Surgeon  Anesthesia Plan Comments:        Anesthesia Quick Evaluation

## 2015-12-28 NOTE — Op Note (Signed)
POSTERIOR  APPROACH [facility] HIP ARTHROPLASTY  [name] [name]   [id number]  Pre-op Diagnosis: left femoral neck fx     Post-op Diagnosis: same   Operative Procedures  1. Open treatment of femoral fracture, proximal end, neck, prosthetic replacement CPT [phone]  Personnel  Surgeon(s): Holger Sokolowski Ephriam Jenkins, MD   Anesthesia: general  Prosthesis: Depuy Femur: Corail KA 12 Head: 53 mm size: +5 Bearing Type: Bipolar  Date of Service: 12/27/2015 - 12/28/2015  Indication: 70 y.o.. year old male who suffered a ground level fall and was found to have sustained a Left hip fracture. The patient was found to have a hip fracture that was appropriate for operative management. We reviewed the risk and benefits with the patient and family and they elected to proceed.  Procedure: After informed consent was obtained and understanding of the risk were voiced including but not limited to bleeding, infection, damage to surrounding structures including nerves and vessels, blood clots, leg length inequality, dislocation and the failure to achieve desired results, including death, the operative extremity was marked with verbal confirmation of the patient in the holding area.   The patient was then brought to the operating room and transported to the operating room table and placed in the lateral decubitus position. The operative limb was then prepped and draped in the usual sterile fashion and preoperative antibiotics were administered.  A time out was performed prior to the start of surgery confirming the correct extremity, preoperative antibiotic administration, as well as team members, and that implants and instruments available for the case. Correct surgical site was also confirmed with preoperative radiographs. A standard posterior approach to the hip was performed. A capsulotomy was performed and the capsule tagged for later repair. The labrum was preserved. The hip was dislocated and the femoral neck cut was made  at approximately 1.5 cm above the level of the lesser trochanter using the femoral neck cutting guide. We then used a corkscrew and cobb to remove the femoral head from the acetabulum. We measured the head and proceeded to trial head sizes and found 53 to the the appropriate size. We then turned our attention to femoral preparation. We began sequential broaching to a size 12 stem. This size produced good fit and rotational stability. We placed the trial neck and a 53 mm +5 head.  Leg lengths were evaluated on the table. The hip was stable in extension and external rotation without impingement. The hip was stable in deep flexion. In 90 degrees of flexion and neutral abduction the hip was stable to 60 degrees of internal rotation. In a position of sleep with hip adducted across the body the hip was stable to 60 degrees of rotation.  We then turned back to the femur and tested the broach for stability and fit, and removed the trial broach. The final femoral implant was placed, and the trial head was again trialed for stability. We then irrigated and dried the trunion, and the final head was placed. We placed an 53 mm +5 head. The hip was again reduced, and taken through a range of motion and found to be stable as above. The hip was thoroughly irrigated, and a posterior capsular repair was performed with #2 Ethibond. The wound was irrigated with normal saline. The deep fascia was closed with 1 PDS, 0 vicryl for the deep fat layer, and 2.0 Vicryl Plus for the subcutaneous tissue. The skin was closed with staples. A sterile dressing was applied. The patient was awakened and  transported to the recovery room in stable condition. All sponge, needle, and instrument counts were correct at the end of the case.  Position: lateral decubitus   Complications: none.  Time Out: performed   Drains/Packing: none  Estimated blood loss: 300 cc  Returned to Recovery Room: in good condition.   Antibiotics: yes    Mechanical VTE (DVT) Prophylaxis: sequential compression devices, TED thigh-high  Chemical VTE (DVT) Prophylaxis: lovenox  Fluid Replacement  Crystalloid: see anesthesia record Blood: none  FFP: none   Specimens Removed: 1 to pathology   Sponge and Instrument Count Correct? yes   PACU: portable radiograph - low AP pelvis  Admission: inpatient status, start PT & OT POD#1  Plan/RTC: Return in 2 weeks for wound check.  Weight Bearing/Load Lower Extremity: full  Posterior hip precautions  N. Eduard Roux, MD [place] [phone] 6:20 PM

## 2015-12-28 NOTE — Progress Notes (Signed)
Inpatient Diabetes Program Recommendations  AACE/ADA: New Consensus Statement on Inpatient Glycemic Control (2015)  Target Ranges:  Prepandial:   less than 140 mg/dL      Peak postprandial:   less than 180 mg/dL (1-2 hours)      Critically ill patients:  140 - 180 mg/dL  Results for [name] [name], [name] [name] (MRN [id number]) as of 12/28/2015 11:51  Ref. Range 12/28/2015 01:45 12/28/2015 06:44  Glucose-Capillary Latest Ref Range: 65-99 mg/dL 230 (H) 332 (H)   Review of Glycemic Control  Diabetes history: DM2 Outpatient Diabetes medications: Levemir 30 units QAM, Levemir 40 units QPM Current orders for Inpatient glycemic control: Levemir 20 units daily @ 10 am, Levemir 30 units QHS, Novolog 0-9 units TID with meals  Inpatient Diabetes Program Recommendations: Correction (SSI): Please order Novolog BEDTIME correction scale. HgbA1C: Please consider ordering an A1C to evaluate glycemic control over the past 2-3 months.  Thanks, Barnie Alderman, RN, MSN, CDE Diabetes Coordinator Inpatient Diabetes Program [phone] (Team Pager from [place] to [place]) [phone] (AP office) [phone] [facility] office) [phone] [facility] office)

## 2015-12-28 NOTE — Progress Notes (Signed)
Utilization review completed.  

## 2015-12-28 NOTE — Consult Note (Addendum)
ORTHOPAEDIC CONSULTATION  REQUESTING PHYSICIAN: Geradine Girt, DO  Chief Complaint: Left hip fracture  HPI: [name] [name] is a 70 y.o. male who presents with left hip fracture s/p mechanical fall.  The patient endorses severe pain in the left hip, that does not radiate, grinding in quality, worse with any movement, better with immobilization.  Denies LOC/fever/chills/nausea/vomiting.  Walks with assistive devices (walker, cane, wheelchair).  Does live independently with wife.  Past Medical History  Diagnosis Date  . Cardiomyopathy, nonischemic ([place])     a. Cath 2003: mild nonobstructive CAD, EF 25% at that time.  [name] PAF (paroxysmal atrial fibrillation) ([place])     a. Noted on ICD interrogation 2012;  b. coumadin d/c'd 01/2013.  [name] NSVT (nonsustained ventricular tachycardia) ([place])     a. Noted on ICD interrogation in 2011.  . High cholesterol     takes Atorvastatin daily  . PAD (peripheral artery disease) ([place])     a. 08/2013 Periph Angio/PTA: Abd Ao nl, RLE- 3v runoff, PT diff dzs, AT 90p, LLE 2v runoff, PT 100, AT 46m(diamondback ORA/chocolate balloon PTA).  . Automatic implantable cardioverter-defibrillator in situ     a. 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  . Dementia   . Arthritis   . History of blood transfusion     no abnormal reaction noted  . Complication of anesthesia   . PONV (postoperative nausea and vomiting)   . Hypertension     takes Coreg daily  . LBBB (left bundle branch block)     takes Coumadin daily  . Anemia     takes Iron pill daily  . Constipation     takes Sennokot daily  . GERD (gastroesophageal reflux disease)     takes Nexium daily  . Chronic systolic CHF (congestive heart failure) (HCC)     takes Furosemide daily as well as Aldactone  . Pneumonia     hx of > 89yrgo  . History of bronchitis     > 1 yr ago  . Headache     occasionally  . Peripheral neuropathy (HCC)     hands;numbness and tingling   .  Urinary frequency   . Urinary urgency   . History of kidney stones   . Type II diabetes mellitus ([place]    takes Levemir daily as well as Novolog  . CAD (coronary artery disease)   . PAF (paroxysmal atrial fibrillation) ([place]  . CKD (chronic kidney disease), stage III    Past Surgical History  Procedure Laterality Date  . Lithotripsy  2001  . Cervical spine surgery  1994  . Cardiac defibrillator placement  06/2009    WITH GENERATOR REPLACED; BiV ICD  . UsKoreachocardiography  03/21/2008    EF 30-35%  . Cardiovascular stress test  03/20/2009    EF 33%  . Transluminal atherectomy tibial artery Left 09/12/2013  . Toe amputation  10/04/2013    LEFT GREAT TOE AND 4TH TOE   /   DR XUErlinda Hong. Amputation Left 10/04/2013    Procedure: LEFT GREAT TOE AND SECOND TOE AMPUTATION;  Surgeon: NaMarianna PaymentMD;  [place] Service: Orthopedics;  Laterality: Left;  . Colonoscopy    . Leg amputation below knee Left 11/09/2013    DR XUErlinda Hong. Amputation Left 11/09/2013    Procedure: LEFT AMPUTATION BELOW KNEE;  Surgeon: NaMarianna PaymentMD;  [place] Service: [place]opedics;  Laterality: Left;  .  Amputation Right 01/04/2014    Procedure: Doristine Devoid second and third toe amputation;  Surgeon: Marianna Payment, MD;  Location: [place];  Service: Orthopedics;  Laterality: Right;  . Amputation Right 01/30/2014    Procedure: RIGHT BELOW KNEE AMPUTATION;  Surgeon: Marianna Payment, MD;  Location: [place];  Service: Orthopedics;  Laterality: Right;  . Lower extremity angiogram Bilateral 09/12/2013    Procedure: LOWER EXTREMITY ANGIOGRAM;  Surgeon: Lorretta Harp, MD;  Location: [facility] CATH LAB;  Service: Cardiovascular;  Laterality: Bilateral;  . Abdominal angiogram  09/12/2013    Procedure: ABDOMINAL ANGIOGRAM;  Surgeon: Lorretta Harp, MD;  Location: [facility] CATH LAB;  Service: Cardiovascular;;  . Lower extremity angiogram N/A 12/29/2013    Procedure: LOWER EXTREMITY ANGIOGRAM;  Surgeon: Lorretta Harp, MD;   Location: [facility] CATH LAB;  Service: Cardiovascular;  Laterality: N/A;  . Atherectomy Right 12/29/2013    Procedure: ATHERECTOMY;  Surgeon: Lorretta Harp, MD;  Location: [facility] CATH LAB;  Service: Cardiovascular;  Laterality: Right;  Anterior Tibial Artery  . Cardiac catheterization  10/01/2001    THERE WAS GLOBAL HYPOKINESIS AND EF 25%. THERE APPEARED TO BE GLOBAL DECREASE IN WALL MOTION  . Esophagogastroduodenoscopy    . Stump revision Left 01/03/2015    Procedure: LEFT BELOW KNEE AMPUTATION REVISION;  Surgeon: Leandrew Koyanagi, MD;  Location: [place];  Service: Orthopedics;  Laterality: Left;   Social History   Social History  . Marital Status: Married    Spouse Name: N/A  . Number of Children: N/A  . Years of Education: N/A   Social History Main Topics  . Smoking status: Never Smoker   . Smokeless tobacco: Never Used  . Alcohol Use: No  . Drug Use: No  . Sexual Activity: Not Currently   Other Topics Concern  . None   Social History Narrative   Family History  Problem Relation Age of Onset  . Heart disease Mother   . Hypertension Mother   . Diabetes Mother   . Diabetes Father    No Known Allergies Prior to Admission medications   Medication Sig Start Date End Date Taking? Authorizing Provider  atorvastatin (LIPITOR) 10 MG tablet Take 1 tablet (10 mg total) by mouth daily. 01/15/15  Yes Daniel J Angiulli, PA-C  carvedilol (COREG) 12.5 MG tablet Take 1 tablet (12.5 mg total) by mouth 2 (two) times daily with a meal. 01/15/15  Yes Daniel J Angiulli, PA-C  cholecalciferol (VITAMIN D) 1000 UNITS tablet Take 1 tablet (1,000 Units total) by mouth daily. 01/15/15  Yes Daniel J Angiulli, PA-C  clopidogrel (PLAVIX) 75 MG tablet Take 1 tablet (75 mg total) by mouth daily with breakfast. 01/15/15  Yes Lavon Paganini Angiulli, PA-C  esomeprazole (NEXIUM) 40 MG capsule Take 1 capsule (40 mg total) by mouth daily as needed (acid reflux). 01/15/15  Yes Daniel J Angiulli, PA-C  furosemide (LASIX) 40 MG tablet  Take 1 tablet (40 mg total) by mouth 2 (two) times daily. 01/15/15  Yes Daniel J Angiulli, PA-C  insulin detemir (LEVEMIR) 100 UNIT/ML injection 30 unit AM and 40 unit PM 01/15/15  Yes Daniel J Angiulli, PA-C  iron polysaccharides (NIFEREX) 150 MG capsule Take 1 capsule (150 mg total) by mouth daily. 01/15/15  Yes Daniel J Angiulli, PA-C  Multiple Vitamin (MULTIVITAMIN WITH MINERALS) TABS tablet Take 1 tablet by mouth daily.   Yes Historical Provider, MD  potassium chloride SA (K-DUR,KLOR-CON) 20 MEQ tablet Take 1 tablet (20 mEq total) by mouth 2 (two)  times daily. 02/13/14  Yes Daniel J Angiulli, PA-C  pregabalin (LYRICA) 75 MG capsule Take 1 capsule (75 mg total) by mouth 2 (two) times daily. 01/15/15  Yes Daniel J Angiulli, PA-C  sacubitril-valsartan (ENTRESTO) 24-26 MG Take 1 tablet by mouth 2 (two) times daily. 10/31/15  Yes Deboraha Sprang, MD  senna-docusate (SENOKOT-S) 8.6-50 MG per tablet Take 1 tablet by mouth 2 (two) times daily. 11/28/13  Yes Ivan Anchors Love, PA-C  spironolactone (ALDACTONE) 25 MG tablet Take 0.5 tablets (12.5 mg total) by mouth at bedtime. 01/15/15  Yes Daniel J Angiulli, PA-C  vitamin E (VITAMIN E) 1000 UNIT capsule Take 1,000 Units by mouth daily.   Yes Historical Provider, MD  warfarin (COUMADIN) 10 MG tablet TAKE AS DIRECTED BY COUMADIN CLINIC 11/15/15  Yes Deboraha Sprang, MD  warfarin (COUMADIN) 5 MG tablet TAKE AS DIRECTED BY COUMADIN CLINIC 08/08/15  Yes Deboraha Sprang, MD  aspirin 81 MG EC tablet Take 1 tablet (81 mg total) by mouth daily. 10/24/14   Deboraha Sprang, MD  sulfamethoxazole-trimethoprim (BACTRIM DS,SEPTRA DS) 800-160 MG per tablet Take 1 tablet by mouth every 12 (twelve) hours. 01/15/15   Lavon Paganini Angiulli, PA-C   Dg Chest 1 View  12/27/2015  CLINICAL DATA:  Left hip pain after a fall. Preop for hip fracture. Defibrillator. Slight congestion. Diabetes. Hypertension. EXAM: CHEST 1 VIEW COMPARISON:  01/30/2014 FINDINGS: Cardiac pacemaker. Normal heart size and pulmonary  vascularity. No focal airspace disease or consolidation. Slight fibrosis or linear atelectasis in the right lung base. No blunting of costophrenic angles. No pneumothorax. Mediastinal contours appear intact. IMPRESSION: No active disease. Electronically Signed   By: Lucienne Capers M.D.   On: 12/27/2015 23:04   Dg Pelvis 1-2 Views  12/27/2015  CLINICAL DATA:  Left hip pain after a fall. EXAM: LEFT FEMUR 2 VIEWS; PELVIS - 1-2 VIEW COMPARISON:  None. FINDINGS: There is an acute transverse fracture of the left femoral shaft with superior displacement of the distal fracture fragment resulting in varus angulation of the hip. No evidence of dislocation of the hip. Midshaft and distal left femur appear intact. No additional fractures are demonstrated. Pelvis appears intact. No acute fracture or dislocation demonstrated in the pelvis. SI joints and symphysis pubis are not displaced. Visualized right hip appears intact. Diffuse vascular calcifications. IMPRESSION: Acute transverse fracture of the left femoral neck with varus angulation. No pelvic fractures identified. Electronically Signed   By: Lucienne Capers M.D.   On: 12/27/2015 22:56   Ct Head Wo Contrast  12/27/2015  CLINICAL DATA:  [place] while gardening.  Anticoagulated. EXAM: CT HEAD WITHOUT CONTRAST TECHNIQUE: Contiguous axial images were obtained from the base of the skull through the vertex without intravenous contrast. COMPARISON:  03/02/2013 FINDINGS: There is no intracranial hemorrhage or extra-axial fluid collection. There is moderate generalized atrophy. There is white matter hypodensity which is chronic and likely due to small vessel disease. There is more focally prominent patchy hypodensity in the subcortical white matter of the right frontoparietal convexity, unchanged from 03/02/2013 but possibly representing an area of remote white matter infarction. No acute findings are evident. Calvarium and skullbase are intact. Orbits are unremarkable.  IMPRESSION: No acute findings. There is moderate generalized atrophy and chronic white matter hypodensity, likely small vessel disease. Electronically Signed   By: Andreas Newport M.D.   On: 12/27/2015 23:11   Dg Femur Min 2 Views Left  12/27/2015  CLINICAL DATA:  Left hip pain after a fall. EXAM: LEFT  FEMUR 2 VIEWS; PELVIS - 1-2 VIEW COMPARISON:  None. FINDINGS: There is an acute transverse fracture of the left femoral shaft with superior displacement of the distal fracture fragment resulting in varus angulation of the hip. No evidence of dislocation of the hip. Midshaft and distal left femur appear intact. No additional fractures are demonstrated. Pelvis appears intact. No acute fracture or dislocation demonstrated in the pelvis. SI joints and symphysis pubis are not displaced. Visualized right hip appears intact. Diffuse vascular calcifications. IMPRESSION: Acute transverse fracture of the left femoral neck with varus angulation. No pelvic fractures identified. Electronically Signed   By: Lucienne Capers M.D.   On: 12/27/2015 22:56    All pertinent xrays, MRI, CT independently reviewed and interpreted  Positive ROS: All other systems have been reviewed and were otherwise negative with the exception of those mentioned in the HPI and as above.  Physical Exam: General: Alert, no acute distress Cardiovascular: No pedal edema Respiratory: No cyanosis, no use of accessory musculature GI: No organomegaly, abdomen is soft and non-tender Skin: No lesions in the area of chief complaint Neurologic: Sensation intact distally Psychiatric: Patient is competent for consent with normal mood and affect Lymphatic: No axillary or cervical lymphadenopathy  MUSCULOSKELETAL:  - severe pain with movement of the hip and extremity - skin intact - NVI distally - compartments soft  Assessment: Left femoral neck hip fracture  Plan: - partial hip replacement is recommended, patient and family are aware of  r/b/a and wish to proceed - consent obtained - medical optimization per primary team - surgery is planned for this afternoon - vitamin K ordered - type and cross - Based on history and fracture pattern this likely represents a fragility fracture. - Fragility fractures affect up to one half of women and one third of men after age 65 years and occur in the setting of bone disorder such as osteoporosis or osteopenia and warrant appropriate work-up. - The following are general recommendations that may serve as an outline for an appropriate work-up:  1.) Obtain bone density measurement to confirm presumptive diagnosis, assess severity of osteoporosis and risk of future fracture, and use as baseline for monitoring treatment  2.) Obtain laboratory tests: CBC, ESR, serum calcium, creatinine, albumin,phosphate, alkaline phosphatase, liver transaminases, protein electrophoresis, urinalysis, 25-hydroxyvitamin D.  3.) Exclude secondary causes of low bone mass and skeletal fragility (eg,multiple myeloma, lymphoma) as indicated.  4.) Obtain radiograph of thoracic and lumbar spine, particularly among individuals with back pain or height loss to assess presence of vertebral fractures  5.) Intermittent administration of recombinant human parathyroid hormone  6.) Optimize nutritional status using nutritional supplementation.  7.) Patient/family education to prevent future falls.  8.) Early mobilization and exercise program - exercise decreases the rate of bone loss and has been associated with decreased rate of fragility fractures   Thank you for the consult and the opportunity to see [name]. Eduard Roux, MD [place] 7:11 AM

## 2015-12-28 NOTE — Progress Notes (Signed)
PT Cancellation Note  Patient Details Name: [name] [name] MRN: [id number] DOB: Oct 06, 1945   Cancelled Treatment:    Reason Eval/Treat Not Completed: Medical issues which prohibited therapy (bedrest and await sx)   [name] 12/28/2015, 7:08 AM Elwyn Reach, [place]

## 2015-12-28 NOTE — Anesthesia Procedure Notes (Signed)
Procedure Name: Intubation Date/Time: 12/28/2015 4:45 PM Performed by: [name] [place] T Pre-anesthesia Checklist: Patient identified, Emergency Drugs available, Suction available and Patient being monitored Patient Re-evaluated:Patient Re-evaluated prior to inductionOxygen Delivery Method: Circle System Utilized Preoxygenation: Pre-oxygenation with 100% oxygen Intubation Type: IV induction Ventilation: Mask ventilation without difficulty Laryngoscope Size: Miller and 2 Grade View: Grade I Tube type: Oral Tube size: 7.5 mm Number of attempts: 1 Airway Equipment and Method: Stylet Placement Confirmation: ETT inserted through vocal cords under direct vision,  positive ETCO2 and breath sounds checked- equal and bilateral Secured at: 23 cm Tube secured with: Tape Dental Injury: Teeth and Oropharynx as per pre-operative assessment

## 2015-12-29 LAB — GLUCOSE, CAPILLARY
GLUCOSE-CAPILLARY: 179 mg/dL — AB (ref 65–99)
Glucose-Capillary: 177 mg/dL — ABNORMAL HIGH (ref 65–99)
Glucose-Capillary: 189 mg/dL — ABNORMAL HIGH (ref 65–99)

## 2015-12-29 LAB — HEMOGLOBIN A1C
HEMOGLOBIN A1C: 9.9 % — AB (ref 4.8–5.6)
Mean Plasma Glucose: 237 mg/dL

## 2015-12-29 LAB — BASIC METABOLIC PANEL
Anion gap: 13 (ref 5–15)
BUN: 25 mg/dL — AB (ref 6–20)
CHLORIDE: 105 mmol/L (ref 101–111)
CO2: 25 mmol/L (ref 22–32)
Calcium: 8.2 mg/dL — ABNORMAL LOW (ref 8.9–10.3)
Creatinine, Ser: 1.61 mg/dL — ABNORMAL HIGH (ref 0.61–1.24)
GFR calc Af Amer: 48 mL/min — ABNORMAL LOW (ref >60)
GFR calc non Af Amer: 41 mL/min — ABNORMAL LOW (ref >60)
GLUCOSE: 195 mg/dL — AB (ref 65–99)
POTASSIUM: 4 mmol/L (ref 3.5–5.1)
Sodium: 143 mmol/L (ref 135–145)

## 2015-12-29 LAB — CBC
HCT: 32.2 % — ABNORMAL LOW (ref 39.0–52.0)
HEMOGLOBIN: 9.9 g/dL — AB (ref 13.0–17.0)
MCH: 23.9 pg — AB (ref 26.0–34.0)
MCHC: 30.7 g/dL (ref 30.0–36.0)
MCV: 77.8 fL — AB (ref 78.0–100.0)
Platelets: 163 10*3/uL (ref 150–400)
RBC: 4.14 MIL/uL — AB (ref 4.22–5.81)
RDW: 17 % — ABNORMAL HIGH (ref 11.5–15.5)
WBC: 6.5 10*3/uL (ref 4.0–10.5)

## 2015-12-29 LAB — CREATININE, SERUM
Creatinine, Ser: 1.66 mg/dL — ABNORMAL HIGH (ref 0.61–1.24)
GFR calc Af Amer: 46 mL/min — ABNORMAL LOW (ref >60)
GFR calc non Af Amer: 40 mL/min — ABNORMAL LOW (ref >60)

## 2015-12-29 LAB — PROTIME-INR
INR: 1.32 (ref 0.00–1.49)
Prothrombin Time: 16.5 seconds — ABNORMAL HIGH (ref 11.6–15.2)

## 2015-12-29 LAB — VITAMIN D 25 HYDROXY (VIT D DEFICIENCY, FRACTURES): VIT D 25 HYDROXY: 20.5 ng/mL — AB (ref 30.0–100.0)

## 2015-12-29 MED ORDER — INSULIN ASPART 100 UNIT/ML ~~LOC~~ SOLN
0.0000 [IU] | Freq: Every day | SUBCUTANEOUS | Status: DC
  Administered 2015-12-30: 3 [IU] via SUBCUTANEOUS
  Administered 2015-12-31 – 2016-01-01 (×2): 2 [IU] via SUBCUTANEOUS

## 2015-12-29 MED ORDER — WARFARIN SODIUM 7.5 MG PO TABS
15.0000 mg | ORAL_TABLET | Freq: Once | ORAL | Status: AC
  Administered 2015-12-29: 15 mg via ORAL
  Filled 2015-12-29: qty 2

## 2015-12-29 MED ORDER — INSULIN ASPART 100 UNIT/ML ~~LOC~~ SOLN
0.0000 [IU] | Freq: Three times a day (TID) | SUBCUTANEOUS | Status: DC
  Administered 2015-12-29: 2 [IU] via SUBCUTANEOUS
  Administered 2015-12-29: 3 [IU] via SUBCUTANEOUS
  Administered 2015-12-30: 1 [IU] via SUBCUTANEOUS
  Administered 2015-12-31 (×2): 3 [IU] via SUBCUTANEOUS
  Administered 2015-12-31: 5 [IU] via SUBCUTANEOUS
  Administered 2016-01-01: 2 [IU] via SUBCUTANEOUS
  Administered 2016-01-01: 5 [IU] via SUBCUTANEOUS
  Administered 2016-01-01 – 2016-01-02 (×2): 2 [IU] via SUBCUTANEOUS

## 2015-12-29 NOTE — Progress Notes (Signed)
PROGRESS NOTE    [name] [name]  [id number] DOB: 1945/11/18 DOA: 12/27/2015 PCP: [name], MD   Outpatient Specialists:     Brief Narrative:  [name] [name] is a 70 y.o. 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  He is coming from home.  S/p partial hip replacement on 5/5.   Assessment & Plan:   Principal Problem:   Closed left hip fracture (HCC) Active Problems:   SYSTOLIC HEART FAILURE, CHRONIC   HTN (hypertension)   BiV ICD (BS).  ICD in '05, BiV ICD 11/10   Critical lower limb ischemia- s/p Rt anterior tibial PTA 12/29/13 in preparation for Rt BKA   PAD (peripheral artery disease) (HCC)   Diabetes mellitus ([place])   Hyperlipidemia   PAF (paroxysmal atrial fibrillation) ([place])   Diabetes mellitus with neuropathy ([place])   CKD (chronic kidney disease), stage III   Closed left hip fracture [facility]):  S/p partial hip replacement 5/5 -PT/OT consult-- has been to CIR in past  Vit D def - PO replacement  Chronic combined systolic and diastolic congestive heart failure: 2D echo on 02/15/13 showed EF 20-25 percent with grade 1 diastolic dysfunction. Patient does not have JVD. CHF is compensated on admission. -Continue Lasix and spironolactone -Continue aspirin and Coreg  HTN: -continue Lasix, spironolactone, Coreg  PAF: CHA2DS2-VASc Score is 4 -resume home coumadin  DM-II: Last A1c 9.9, poorly controled -levemir -SSI  HLD: Last LDL was 98  -Continue home medications: Lipitor  CKD (chronic kidney disease), stage III: Stable. Baseline creatinine 1.3-1.4. His cre is 1.42. -Follow-up renal function by BMP   DVT prophylaxis:  coumadin  Code Status: Full Code   Family Communication: Wife 5/5  Disposition Plan:  PT  eval   Consultants:   OrthoErlinda Hong  Procedures:   Partial hip replacement 5/6  Antimicrobials:      Subjective: Not eating much Some pain in hip  Objective: Filed Vitals:   12/28/15 1930 12/28/15 2014 12/29/15 0208 12/29/15 0631  BP: 111/68 114/62 120/71 107/51  Pulse: 77 76 90 84  Temp:  97.5 F (36.4 C) 98.8 F (37.1 C) 99.6 F (37.6 C)  TempSrc:  Oral Oral Oral  Resp: 14 16 15 16   Height:      Weight:      SpO2: 97% 98% 99% 97%    Intake/Output Summary (Last 24 hours) at 12/29/15 0921 Last data filed at 12/29/15 0851  Gross per 24 hour  Intake    720 ml  Output   3350 ml  Net  -2630 ml   Filed Weights   12/28/15 0145 12/28/15 0640  Weight: 91.3 kg (201 lb 4.5 oz) 91.7 kg (202 lb 2.6 oz)    Examination:  General exam: Appears calm and comfortable  Respiratory system: Clear to auscultation. Respiratory effort normal. Cardiovascular system: S1 & S2 heard, RRR. No JVD, murmurs, rubs, gallops or clicks. Gastrointestinal system: Abdomen is nondistended, soft and nontender. No organomegaly or masses felt. Decrease bowel sounds Central nervous system: Alert and oriented. No focal neurological deficits. B/l amputee   Data Reviewed: I have personally reviewed following labs and imaging studies  CBC:  Recent Labs Lab 12/27/15 2330 12/27/15 2354 12/28/15 0550 12/28/15 2245 12/29/15 0551  WBC 7.4  --  6.5 9.0 6.5  HGB 12.1* 14.3 11.1* 10.9* 9.9*  HCT 37.1* 42.0 33.9* 34.8* 32.2*  MCV 76.0*  --  77.4* 77.2* 77.8*  PLT 177  --  176 168 [id number]   Basic Metabolic Panel:  Recent Labs Lab 12/27/15 2330 12/27/15 2354 12/28/15 0550 12/28/15 2245 12/29/15 0551  NA 140 142 140  --  143  K 4.6 4.4 4.6  --  4.0  CL 107 106 107  --  105  CO2 21*  --  22  --  25  GLUCOSE 308* 301* 354*  --  195*  BUN 30* 32* 28*  --  25*  CREATININE 1.42* 1.40* 1.41* 1.66* 1.61*  CALCIUM 9.3  --  9.0  --  8.2*   GFR: Estimated Creatinine Clearance: 46.4 mL/min (by C-G  formula based on Cr of 1.61). Liver Function Tests: No results for input(s): AST, ALT, ALKPHOS, BILITOT, PROT, ALBUMIN in the last 168 hours. No results for input(s): LIPASE, AMYLASE in the last 168 hours. No results for input(s): AMMONIA in the last 168 hours. Coagulation Profile:  Recent Labs Lab 12/27/15 2330 12/28/15 1202 12/29/15 0551  INR 1.66* 1.57* 1.32   Cardiac Enzymes: No results for input(s): CKTOTAL, CKMB, CKMBINDEX, TROPONINI in the last 168 hours. BNP (last 3 results) No results for input(s): PROBNP in the last 8760 hours. HbA1C:  Recent Labs  12/28/15 1202  HGBA1C 9.9*   CBG:  Recent Labs Lab 12/28/15 1123 12/28/15 1512 12/28/15 1854 12/28/15 2203 12/29/15 0626  GLUCAP 251* 144* 126* 152* 177*   Lipid Profile: No results for input(s): CHOL, HDL, LDLCALC, TRIG, CHOLHDL, LDLDIRECT in the last 72 hours. Thyroid Function Tests: No results for input(s): TSH, T4TOTAL, FREET4, T3FREE, THYROIDAB in the last 72 hours. Anemia Panel: No results for input(s): VITAMINB12, FOLATE, FERRITIN, TIBC, IRON, RETICCTPCT in the last 72 hours. Urine analysis:    Component Value Date/Time   COLORURINE YELLOW 01/11/2015 1850   APPEARANCEUR TURBID* 01/11/2015 1850   LABSPEC 1.013 01/11/2015 1850   PHURINE 8.5* 01/11/2015 1850   GLUCOSEU NEGATIVE 01/11/2015 1850   HGBUR MODERATE* 01/11/2015 1850   BILIRUBINUR NEGATIVE 01/11/2015 1850   KETONESUR NEGATIVE 01/11/2015 1850   PROTEINUR 100* 01/11/2015 1850   UROBILINOGEN 0.2 01/11/2015 1850   NITRITE NEGATIVE 01/11/2015 1850   LEUKOCYTESUR LARGE* 01/11/2015 1850    Recent Results (from the past 240 hour(s))  Surgical pcr screen     Status: Abnormal   Collection Time: 12/28/15  1:45 AM  Result Value Ref Range Status   MRSA, PCR NEGATIVE NEGATIVE Final   Staphylococcus aureus POSITIVE (A) NEGATIVE Final    Comment:        The Xpert SA Assay (FDA approved for NASAL specimens in patients over 35 years of age), is one  component of a comprehensive surveillance program.  Test performance has been validated by [facility] for patients greater than or equal to 13 year old. It is not intended to diagnose infection nor to guide or monitor treatment.       Anti-infectives    Start     Dose/Rate Route Frequency Ordered Stop   12/29/15 0000  ceFAZolin (ANCEF) IVPB 2g/100 mL premix     2 g 200 mL/hr over 30 Minutes Intravenous Every 6 hours 12/28/15 2010 12/29/15 1759   12/28/15 1530  ceFAZolin (ANCEF) IVPB 2g/100 mL premix     2 g 200 mL/hr over 30 Minutes Intravenous To [facility] 12/28/15 0807 12/28/15 1705       Radiology Studies: Dg Chest 1 View  12/27/2015  CLINICAL DATA:  Left hip pain after a fall. Preop for hip fracture. Defibrillator. Slight congestion. Diabetes. Hypertension. EXAM: CHEST 1 VIEW COMPARISON:  01/30/2014 FINDINGS: Cardiac pacemaker. Normal heart size and pulmonary vascularity. No focal airspace disease or consolidation. Slight fibrosis or linear atelectasis in the right lung base. No blunting of costophrenic angles. No pneumothorax. Mediastinal contours appear intact. IMPRESSION: No active disease. Electronically Signed   By: Lucienne Capers M.D.   On: 12/27/2015 23:04   Dg Pelvis 1-2 Views  12/27/2015  CLINICAL DATA:  Left hip pain after a fall. EXAM: LEFT FEMUR 2 VIEWS; PELVIS - 1-2 VIEW COMPARISON:  None. FINDINGS: There is an acute transverse fracture of the left femoral shaft with superior displacement of the distal fracture fragment resulting in varus angulation of the hip. No evidence of dislocation of the hip. Midshaft and distal left femur appear intact. No additional fractures are demonstrated. Pelvis appears intact. No acute fracture or dislocation demonstrated in the pelvis. SI joints and symphysis pubis are not displaced. Visualized right hip appears intact. Diffuse vascular calcifications. IMPRESSION: Acute transverse fracture of the left femoral neck with varus  angulation. No pelvic fractures identified. Electronically Signed   By: Lucienne Capers M.D.   On: 12/27/2015 22:56   Ct Head Wo Contrast  12/27/2015  CLINICAL DATA:  [place] while gardening.  Anticoagulated. EXAM: CT HEAD WITHOUT CONTRAST TECHNIQUE: Contiguous axial images were obtained from the base of the skull through the vertex without intravenous contrast. COMPARISON:  03/02/2013 FINDINGS: There is no intracranial hemorrhage or extra-axial fluid collection. There is moderate generalized atrophy. There is white matter hypodensity which is chronic and likely due to small vessel disease. There is more focally prominent patchy hypodensity in the subcortical white matter of the right frontoparietal convexity, unchanged from 03/02/2013 but possibly representing an area of remote white matter infarction. No acute findings are evident. Calvarium and skullbase are intact. Orbits are unremarkable. IMPRESSION: No acute findings. There is moderate generalized atrophy and chronic white matter hypodensity, likely small vessel disease. Electronically Signed   By: Andreas Newport M.D.   On: 12/27/2015 23:11   Pelvis Portable  12/28/2015  CLINICAL DATA:  Left hip arthroplasty. EXAM: PORTABLE PELVIS 1-2 VIEWS COMPARISON:  12/27/2015 FINDINGS: Left hip arthroplasty changes identified without complicating features. Postoperative changes in the soft tissues noted. IMPRESSION: Left hip arthroplasty changes without complicating features. Electronically Signed   By: Margarette Canada M.D.   On: 12/28/2015 20:54   Dg Femur Min 2 Views Left  12/27/2015  CLINICAL DATA:  Left hip pain after a fall. EXAM: LEFT FEMUR 2 VIEWS; PELVIS - 1-2 VIEW COMPARISON:  None. FINDINGS: There is an acute transverse fracture of the left femoral shaft with superior displacement of the distal fracture fragment resulting in varus angulation of the hip. No evidence of dislocation of the hip. Midshaft and distal left femur appear intact. No additional  fractures are demonstrated. Pelvis appears intact. No acute fracture or dislocation demonstrated in the pelvis. SI joints and symphysis pubis are not displaced. Visualized right hip appears intact. Diffuse vascular calcifications. IMPRESSION: Acute transverse fracture of the left femoral neck with varus angulation. No pelvic fractures identified. Electronically Signed   By: Lucienne Capers M.D.   On: 12/27/2015 22:56        Scheduled Meds: . atorvastatin  10 mg Oral Daily  . carvedilol  12.5 mg Oral BID WC  .  ceFAZolin (ANCEF) IV  2 g Intravenous Q6H  . Chlorhexidine Gluconate Cloth  6 each Topical Daily  . cholecalciferol  1,000 Units Oral Daily  . clopidogrel  75 mg Oral Q breakfast  . enoxaparin (LOVENOX) injection  40 mg Subcutaneous Q24H  . furosemide  40 mg Oral BID  . insulin aspart  0-9 Units Subcutaneous TID WC  . insulin detemir  20 Units Subcutaneous Daily  . insulin detemir  30 Units Subcutaneous QHS  . iron polysaccharides  150 mg Oral Daily  . multivitamin with minerals  1 tablet Oral Daily  . mupirocin ointment  1 application Nasal BID  . pantoprazole  40 mg Oral Daily  . pregabalin  75 mg Oral BID  . sacubitril-valsartan  1 tablet Oral BID  . senna-docusate  1 tablet Oral BID  . sodium chloride flush  3 mL Intravenous Q12H  . spironolactone  12.5 mg Oral QHS  . tranexamic acid (CYKLOKAPRON) topical -INTRAOP  2,000 mg Topical Once  . vitamin E  1,000 Units Oral Daily  . Warfarin - Pharmacist Dosing Inpatient   Does not apply q1800   Continuous Infusions: . sodium chloride 125 mL/hr at 12/28/15 2035  . lactated ringers 10 mL/hr at 12/28/15 1534     LOS: 1 day    Time spent: 20 min    [place], DO [facility] Pager [phone]  If 7PM-7AM, please contact night-coverage [email or web address] Password [email or web address] 12/29/2015, 9:21 AM

## 2015-12-29 NOTE — Progress Notes (Signed)
Subjective: 1 Day Post-Op Procedure(s) (LRB): POSTERIOR  APPROACH HEMI HIP ARTHROPLASTY (Left) Patient reports pain as mild.  Post-op day #1.  Tolerated surgery well.  Acute blood loss anemia from surgery, but well-compensated.  Objective: Vital signs in last 24 hours: Temp:  [97.5 F (36.4 C)-99.6 F (37.6 C)] 99.6 F (37.6 C) (05/06 0631) Pulse Rate:  [72-90] 84 (05/06 0631) Resp:  [14-19] 16 (05/06 0631) BP: (107-127)/(51-74) 107/51 mmHg (05/06 0631) SpO2:  [96 %-100 %] 97 % (05/06 0631)  Intake/Output from previous day: 05/05 0701 - 05/06 0700 In: 600 [I.V.:600] Out: 3350 [Urine:2850; Blood:500] Intake/Output this shift: Total I/O In: 120 [P.O.:120] Out: -    Recent Labs  12/27/15 2330 12/27/15 2354 12/28/15 0550 12/28/15 2245 12/29/15 0551  HGB 12.1* 14.3 11.1* 10.9* 9.9*    Recent Labs  12/28/15 2245 12/29/15 0551  WBC 9.0 6.5  RBC 4.51 4.14*  HCT 34.8* 32.2*  PLT 168 163    Recent Labs  12/28/15 0550 12/28/15 2245 12/29/15 0551  NA 140  --  143  K 4.6  --  4.0  CL 107  --  105  CO2 22  --  25  BUN 28*  --  25*  CREATININE 1.41* 1.66* 1.61*  GLUCOSE 354*  --  195*  CALCIUM 9.0  --  8.2*    Recent Labs  12/28/15 1202 12/29/15 0551  INR 1.57* 1.32    Incision: dressing C/D/I  Assessment/Plan: 1 Day Post-Op Procedure(s) (LRB): POSTERIOR  APPROACH HEMI HIP ARTHROPLASTY (Left) Up with therapy  Would like to go home post-hospital stay.  Has bilateral BKAs.  Safety may be an issue  Mcarthur Rossetti 12/29/2015, 10:05 AM

## 2015-12-29 NOTE — Progress Notes (Addendum)
ANTICOAGULATION CONSULT NOTE  Pharmacy Consult:  Coumadin Indication:  Afib  No Known Allergies  Patient Measurements: Height: 5\' 9"  (175.3 cm) Weight: 202 lb 2.6 oz (91.7 kg) IBW/kg (Calculated) : 70.7  Vital Signs: Temp: 99.6 F (37.6 C) (05/06 0631) Temp Source: Oral (05/06 0631) BP: 107/51 mmHg (05/06 0631) Pulse Rate: 84 (05/06 0631)  Labs:  Recent Labs  12/27/15 2330  12/28/15 0550 12/28/15 1202 12/28/15 2245 12/29/15 0551  HGB 12.1*  < > 11.1*  --  10.9* 9.9*  HCT 37.1*  < > 33.9*  --  34.8* 32.2*  PLT 177  --  176  --  168 163  APTT  --   --  47*  --   --   --   LABPROT 19.7*  --   --  18.8*  --  16.5*  INR 1.66*  --   --  1.57*  --  1.32  CREATININE 1.42*  < > 1.41*  --  1.66* 1.61*  < > = values in this interval not displayed.  Estimated Creatinine Clearance: 46.4 mL/min (by C-G formula based on Cr of 1.61).   Medical History: Past Medical History  Diagnosis Date  . Cardiomyopathy, nonischemic ([place])     a. Cath 2003: mild nonobstructive CAD, EF 25% at that time.  [name] PAF (paroxysmal atrial fibrillation) ([place])     a. Noted on ICD interrogation 2012;  b. coumadin d/c'd 01/2013.  [name] NSVT (nonsustained ventricular tachycardia) ([place])     a. Noted on ICD interrogation in 2011.  . High cholesterol     takes Atorvastatin daily  . PAD (peripheral artery disease) ([place])     a. 08/2013 Periph Angio/PTA: Abd Ao nl, RLE- 3v runoff, PT diff dzs, AT 90p, LLE 2v runoff, PT 100, AT 58m (diamondback ORA/chocolate balloon PTA).  . Automatic implantable cardioverter-defibrillator in situ     a. 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  . Dementia   . Arthritis   . History of blood transfusion     no abnormal reaction noted  . Complication of anesthesia   . PONV (postoperative nausea and vomiting)   . Hypertension     takes Coreg daily  . LBBB (left bundle branch block)     takes Coumadin daily  . Anemia     takes Iron pill daily  .  Constipation     takes Sennokot daily  . GERD (gastroesophageal reflux disease)     takes Nexium daily  . Chronic systolic CHF (congestive heart failure) (HCC)     takes Furosemide daily as well as Aldactone  . Pneumonia     hx of > 33yr ago  . History of bronchitis     > 1 yr ago  . Headache     occasionally  . Peripheral neuropathy (HCC)     hands;numbness and tingling   . Urinary frequency   . Urinary urgency   . History of kidney stones   . Type II diabetes mellitus ([place])     takes Levemir daily as well as Novolog  . CAD (coronary artery disease)   . PAF (paroxysmal atrial fibrillation) ([place])   . CKD (chronic kidney disease), stage III     Assessment: 70 YOM on Coumadin PTA for history of Afib. Patient's Coumadin was reversed for open treatment of femoral fracture today 12/28/15. Pharmacy consulted to resume Coumadin 5/5. INR is sub-therapeutic and has trended down further, INR 1.66>1.32. Hg down 9.9, plt wnl. No  bleeding reported. On lovenox 40 daily until INR therapeutic.  Home regimen: Coumadin 10mg  daily except 15mg  on Sat, last dose 12/22/15 pta  Goal of Therapy:  INR 2-3  Plan:  - Coumadin 15mg  x 1 dose tonight - Lovenox 40mg  daily until INR therapeutic - Daily INR - Monitor s/sx bleeding   Elicia Lamp, PharmD, BCPS Clinical Pharmacist Pager [phone] 12/29/2015 11:44 AM

## 2015-12-29 NOTE — Progress Notes (Signed)
Pt VSS. Denies pain at this time. Pleasant.

## 2015-12-29 NOTE — Evaluation (Signed)
Physical Therapy Evaluation Patient Details Name: CLARKSON CZAPLICKI MRN: [id number] DOB: Jul 13, 1946 Today's Date: 12/29/2015   History of Present Illness  Pt presents for left THA due to femoral neck fx sustained when fell in garden while ambulating with walker. PMH: bilateral BKA, dementia, LBBB, CHF, CAD, DM  Clinical Impression  Pt is s/p THA resulting in the deficits listed below (see PT Problem List), complicated by bilateral BKA. Pt transferred posterior into recliner from bed as prostheses not present at this time (wife to bring), with +2 max A.  Pt will benefit from skilled PT to increase their independence and safety with mobility to allow discharge to the venue listed below.      Follow Up Recommendations SNF;Supervision/Assistance - 24 hour    Equipment Recommendations  None recommended by PT    Recommendations for Other Services       Precautions / Restrictions Precautions Precautions: Posterior Hip;Fall Precaution Booklet Issued: Yes (comment) Precaution Comments: reviewed posterior precautions and put ho on bathroom door but pt did not verbalize good understanding of these and will need reviewing Restrictions Weight Bearing Restrictions: Yes LLE Weight Bearing: Weight bearing as tolerated      Mobility  Bed Mobility Overal bed mobility: Needs Assistance;+2 for physical assistance Bed Mobility: Supine to Sit     Supine to sit: Max assist;+2 for physical assistance     General bed mobility comments: pt required max A +2 to prop to elbows and then pivot into long sitting, posterior lean maintained to keep precautions and pt with right lean, assumed due to left hip pain  Transfers Overall transfer level: Needs assistance Equipment used: None Transfers: Comptroller transfers: +2 physical assistance;Max assist   General transfer comment: pt did not have prostheses present, wife to bring. Posterior transfer into  recliner with assist at pad under pt. Pt was able to push with bilateral UE's but still required max A +2 for complete transfer into chair   Ambulation/Gait                Stairs            Wheelchair Mobility    Modified Rankin (Stroke Patients Only)       Balance Overall balance assessment: Needs assistance Sitting-balance support: Bilateral upper extremity supported Sitting balance-Leahy Scale: Poor Sitting balance - Comments: required at least min A to maintain sitting balance, did achieve with supervision for approx 30 sec but increasing post lean Postural control: Posterior lean;Right lateral lean                                   Pertinent Vitals/Pain Pain Assessment: Faces Faces Pain Scale: Hurts little more Pain Location: left hip Pain Descriptors / Indicators: Grimacing;Guarding Pain Intervention(s): Limited activity within patient's tolerance;Monitored during session;Repositioned    Home Living Family/patient expects to be discharged to:: Private residence Living Arrangements: Spouse/significant other Available Help at Discharge: Family;Available 24 hours/day Type of Home: House Home Access: Stairs to enter Entrance Stairs-Rails: Left Entrance Stairs-Number of Steps: 2 Home Layout: One level Home Equipment: Walker - 2 wheels;Wheelchair - manual;Tub bench;Bedside commode Additional Comments: per last admission, pt sponge bathes    Prior Function Level of Independence: Needs assistance   Gait / Transfers Assistance Needed: pt dons bilateral prostheses each day and ambulates with RW. W/c for community distances  ADL's / Homemaking Assistance Needed: from last  admission, pt's brother stays with him while wife is at work and helps him as needed.         Hand Dominance   Dominant Hand: Right    Extremity/Trunk Assessment   Upper Extremity Assessment: Defer to OT evaluation           Lower Extremity Assessment: Generalized  weakness      Cervical / Trunk Assessment: Kyphotic  Communication   Communication: No difficulties  Cognition Arousal/Alertness: Lethargic;Suspect due to medications Behavior During Therapy: [facility] for tasks assessed/performed Overall Cognitive Status: History of cognitive impairments - at baseline       Memory: Decreased short-term memory;Decreased recall of precautions              General Comments      Exercises Total Joint Exercises Quad Sets: AROM;Left;10 reps;Seated Hip ABduction/ADduction: AAROM;Left;10 reps;Seated Straight Leg Raises: AAROM;Left;5 reps;Supine      Assessment/Plan    PT Assessment Patient needs continued PT services  PT Diagnosis Acute pain;Generalized weakness;Difficulty walking   PT Problem List Decreased strength;Decreased balance;Decreased mobility;Decreased knowledge of precautions;Pain;Decreased cognition  PT Treatment Interventions DME instruction;Gait training;Functional mobility training;Therapeutic activities;Therapeutic exercise;Balance training;Patient/family education   PT Goals (Current goals can be found in the Care Plan section) Acute Rehab PT Goals Patient Stated Goal: return home PT Goal Formulation: With patient Time For Goal Achievement: 01/05/16 Potential to Achieve Goals: Good    Frequency Min 5X/week   Barriers to discharge        Co-evaluation PT/OT/SLP Co-Evaluation/Treatment: Yes Reason for Co-Treatment: Complexity of the patient's impairments (multi-system involvement);For patient/therapist safety PT goals addressed during session: Mobility/safety with mobility;Balance         End of Session Equipment Utilized During Treatment: Oxygen Activity Tolerance: Patient tolerated treatment well Patient left: in chair;with call bell/phone within reach Nurse Communication: Mobility status         Time: [id number] PT Time Calculation (min) (ACUTE ONLY): 21 min   Charges:   PT Evaluation $PT Eval Moderate  Complexity: 1 Procedure     PT G Codes:      Leighton Roach, PT  Acute Rehab Services  [place], [place] 12/29/2015, 11:54 AM

## 2015-12-29 NOTE — Evaluation (Signed)
Occupational Therapy Evaluation Patient Details Name: [name] [name] MRN: [id number] DOB: 1946/04/11 Today's Date: 12/29/2015    History of Present Illness Pt presents for left THA due to femoral neck fx sustained when fell in garden while ambulating with walker. PMH: bilateral BKA, dementia, LBBB, CHF, CAD, DM   Clinical Impression   Pt reports he was independent with BADLs PTA. Currently pt is overall max assist +2 for A-P transfer from bed to chair and Min-Total assist for ADLs. Pt without bil prostheses at this time; plan for wife to bring from home. Educated pt on posterior hip precautions. Recommend SNF for further rehab prior to return home to increase independence and safety with ADLs and functional mobility. Pt would benefit from continued skilled OT to address established goals.    Follow Up Recommendations  SNF;Supervision/Assistance - 24 hour    Equipment Recommendations  Other (comment) (TBD)    Recommendations for Other Services       Precautions / Restrictions Precautions Precautions: Posterior Hip;Fall Precaution Booklet Issued: Yes (comment) Precaution Comments: reviewed posterior precautions and put ho on bathroom door but pt did not verbalize good understanding of these and will need reviewing Restrictions Weight Bearing Restrictions: Yes LLE Weight Bearing: Weight bearing as tolerated      Mobility Bed Mobility Overal bed mobility: Needs Assistance;+2 for physical assistance Bed Mobility: Supine to Sit     Supine to sit: Max assist;+2 for physical assistance     General bed mobility comments: pt required max A +2 to prop to elbows and then pivot into long sitting, posterior lean maintained to keep precautions and pt with right lean, assumed due to left hip pain  Transfers Overall transfer level: Needs assistance Equipment used: None Transfers: Comptroller transfers: +2 physical assistance;Max assist    General transfer comment: pt did not have prostheses present, wife to bring. Posterior transfer into recliner with assist at pad under pt. Pt was able to push with bilateral UE's but still required max A +2 for complete transfer into chair     Balance Overall balance assessment: Needs assistance Sitting-balance support: Bilateral upper extremity supported Sitting balance-Leahy Scale: Poor Sitting balance - Comments: required at least min A to maintain sitting balance, did achieve with supervision for approx 30 sec but increasing post lean Postural control: Posterior lean;Right lateral lean                                  ADL Overall ADL's : Needs assistance/impaired Eating/Feeding: Set up;Sitting   Grooming: Set up;Sitting   Upper Body Bathing: Minimal assitance;Sitting   Lower Body Bathing: Total assistance;+2 for physical assistance;Sitting/lateral leans   Upper Body Dressing : Minimal assistance;Sitting   Lower Body Dressing: Total assistance;+2 for physical assistance;Sitting/lateral leans                 General ADL Comments: Only assessed pt on A-P transfer secondary to pt not having prostheses. Discussed SNF placement and pt is agreeable; would like [facility] if possible.      Vision     Perception     Praxis      Pertinent Vitals/Pain Pain Assessment: Faces Faces Pain Scale: Hurts little more Pain Location: L hip Pain Descriptors / Indicators: Grimacing;Guarding Pain Intervention(s): Limited activity within patient's tolerance;Monitored during session;Repositioned     Hand Dominance Right   Extremity/Trunk Assessment Upper Extremity Assessment Upper Extremity  Assessment: Generalized weakness   Lower Extremity Assessment Lower Extremity Assessment: Defer to PT evaluation   Cervical / Trunk Assessment Cervical / Trunk Assessment: Kyphotic   Communication Communication Communication: No difficulties   Cognition Arousal/Alertness:  Lethargic;Suspect due to medications Behavior During Therapy: [facility] for tasks assessed/performed Overall Cognitive Status: History of cognitive impairments - at baseline       Memory: Decreased short-term memory;Decreased recall of precautions             General Comments       Exercises Exercises: Total Joint     Shoulder Instructions      Home Living Family/patient expects to be discharged to:: Private residence Living Arrangements: Spouse/significant other Available Help at Discharge: Family;Available 24 hours/day Type of Home: House Home Access: Stairs to enter [employer] of Steps: 2 Entrance Stairs-Rails: Left Home Layout: One level     Bathroom Shower/Tub: Teacher, early years/pre: Handicapped height Bathroom Accessibility: No   Home Equipment: Environmental consultant - 2 wheels;Wheelchair - manual;Tub bench;Bedside commode   Additional Comments: per last admission, pt sponge bathes      Prior Functioning/Environment Level of Independence: Needs assistance  Gait / Transfers Assistance Needed: pt dons bilateral prostheses each day and ambulates with RW. W/c for community distances ADL's / Homemaking Assistance Needed: Pt reports he only takes sponge baths and is able to complete BADLs independenlty. From last admission, pt's brother stays with him while wife is at work and helps him as needed.         OT Diagnosis: Generalized weakness;Acute pain;Cognitive deficits   OT Problem List: Decreased strength;Decreased range of motion;Decreased activity tolerance;Impaired balance (sitting and/or standing);Decreased cognition;Decreased safety awareness;Decreased knowledge of use of DME or AE;Decreased knowledge of precautions;Pain   OT Treatment/Interventions: Self-care/ADL training;Therapeutic exercise;Energy conservation;DME and/or AE instruction;Therapeutic activities;Patient/family education;Balance training    OT Goals(Current goals can be found in the care  plan section) Acute Rehab OT Goals Patient Stated Goal: return home OT Goal Formulation: With patient Time For Goal Achievement: 01/12/16 Potential to Achieve Goals: Good ADL Goals Pt Will Perform Lower Body Bathing: with mod assist;sit to/from stand (with or without AE ) Pt Will Perform Lower Body Dressing: with mod assist;sit to/from stand (with or without AE) Pt Will Transfer to Toilet: with mod assist;ambulating;bedside commode Pt Will Perform Toileting - Clothing Manipulation and hygiene: with mod assist;sit to/from stand Pt/caregiver will Perform Home Exercise Program: Increased strength;Both right and left upper extremity;With theraband;With written HEP provided;Independently  OT Frequency: Min 2X/week   Barriers to D/C:            Co-evaluation PT/OT/SLP Co-Evaluation/Treatment: Yes Reason for Co-Treatment: For patient/therapist safety;Complexity of the patient's impairments (multi-system involvement) PT goals addressed during session: Mobility/safety with mobility;Balance OT goals addressed during session: ADL's and self-care;Other (comment) (mobility)      End of Session Equipment Utilized During Treatment: Oxygen  Activity Tolerance: Patient tolerated treatment well Patient left: in chair;with call bell/phone within reach   Time: 1047-1107 OT Time Calculation (min): 20 min Charges:  OT General Charges $OT Visit: 1 Procedure OT Evaluation $OT Eval Moderate Complexity: 1 Procedure G-Codes:     Binnie Kand M.S., OTR/L Pager: [phone]  12/29/2015, 1:37 PM

## 2015-12-29 NOTE — Progress Notes (Signed)
Physical Therapy Treatment Patient Details Name: VIJAY LISTER MRN: [id number] DOB: 14-Jun-1946 Today's Date: 12/29/2015    History of Present Illness Pt presents for left THA due to femoral neck fx sustained when fell in garden while ambulating with walker. PMH: bilateral BKA, dementia, LBBB, CHF, CAD, DM    PT Comments    PTA assisted patient back to bed to educate nursing staff on transfer technique.  Pt apprehensive as this was his first time standing as his prostheses were not available for evaluation.    Follow Up Recommendations  SNF;Supervision/Assistance - 24 hour     Equipment Recommendations  None recommended by PT    Recommendations for Other Services       Precautions / Restrictions Precautions Precautions: Posterior Hip;Fall Precaution Booklet Issued: Yes (comment) Precaution Comments: reviewed posterior precautions and put ho on bathroom door but pt did not verbalize good understanding of these and will need reviewing Required Braces or Orthoses: Other Brace/Splint (B BKA-required B prostheses.) Restrictions Weight Bearing Restrictions: Yes LLE Weight Bearing: Weight bearing as tolerated    Mobility  Bed Mobility Overal bed mobility: Needs Assistance;+2 for physical assistance Bed Mobility: Supine to Sit     Supine to sit: Max assist;+2 for physical assistance     General bed mobility comments: Left patient sitting edge of bed with 2 NAs to return to supine.    Transfers Overall transfer level: Needs assistance Equipment used: Rolling walker (2 wheeled) Transfers: Sit to/from Stand Sit to Stand: +2 physical assistance;Max assist     Anterior-Posterior transfers: +2 physical assistance;Max assist   General transfer comment: Pt performed sit to stand with B LE prostheses and require max assist for trunk control, boosting into standing and for LLE to advance forward to maintain hip precautions.    Ambulation/Gait Ambulation/Gait assistance: Max  assist Ambulation Distance (Feet): 6 Feet Assistive device: Rolling walker (2 wheeled) Gait Pattern/deviations: Step-to pattern;Shuffle;Trunk flexed;Leaning posteriorly     General Gait Details: Series of shuffling steps to back up to bed.  Pt required cues for sequencing, RW position and upright posture.     Stairs            Wheelchair Mobility    Modified Rankin (Stroke Patients Only)       Balance Overall balance assessment: Needs assistance Sitting-balance support: Bilateral upper extremity supported Sitting balance-Leahy Scale: Poor Sitting balance - Comments: required at least min A to maintain sitting balance, did achieve with supervision for approx 30 sec but increasing post lean Postural control: Posterior lean;Right lateral lean   Standing balance-Leahy Scale: Zero                      Cognition Arousal/Alertness: Awake/alert Behavior During Therapy: WFL for tasks assessed/performed Overall Cognitive Status: History of cognitive impairments - at baseline       Memory: Decreased short-term memory;Decreased recall of precautions              Exercises      General Comments        Pertinent Vitals/Pain Pain Assessment: Faces Pain Score: 5  Faces Pain Scale: Hurts little more Pain Location: L hip Pain Descriptors / Indicators: Grimacing;Guarding Pain Intervention(s): Monitored during session;Repositioned    Home Living Family/patient expects to be discharged to:: Private residence Living Arrangements: Spouse/significant other Available Help at Discharge: Family;Available 24 hours/day Type of Home: House Home Access: Stairs to enter Entrance Stairs-Rails: Left Home Layout: One level Home Equipment: Walker - 2 wheels;Wheelchair -  manual;Tub bench;Bedside commode Additional Comments: per last admission, pt sponge bathes    Prior Function Level of Independence: Needs assistance  Gait / Transfers Assistance Needed: pt dons bilateral  prostheses each day and ambulates with RW. W/c for community distances ADL's / Homemaking Assistance Needed: Pt reports he only takes sponge baths and is able to complete BADLs independenlty. From last admission, pt's brother stays with him while wife is at work and helps him as needed.      PT Goals (current goals can now be found in the care plan section) Acute Rehab PT Goals Patient Stated Goal: return home Potential to Achieve Goals: Good Progress towards PT goals: Progressing toward goals    Frequency  Min 5X/week    PT Plan      Co-evaluation   Reason for Co-Treatment: For patient/therapist safety;Complexity of the patient's impairments (multi-system involvement)   OT goals addressed during session: ADL's and self-care;Other (comment) (mobility)     End of Session Equipment Utilized During Treatment: Gait belt Activity Tolerance: Patient tolerated treatment well Patient left: in chair;with call bell/phone within reach     Time: [id number] PT Time Calculation (min) (ACUTE ONLY): 10 min  Charges:  $Therapeutic Activity: 8-22 mins                    G Codes:      Cristela Blue 16-Jan-2016, 4:16 PM  Governor Rooks, PTA pager [phone]

## 2015-12-30 LAB — BASIC METABOLIC PANEL
Anion gap: 11 (ref 5–15)
BUN: 35 mg/dL — AB (ref 6–20)
CO2: 24 mmol/L (ref 22–32)
Calcium: 8.3 mg/dL — ABNORMAL LOW (ref 8.9–10.3)
Chloride: 105 mmol/L (ref 101–111)
Creatinine, Ser: 2.63 mg/dL — ABNORMAL HIGH (ref 0.61–1.24)
GFR calc Af Amer: 26 mL/min — ABNORMAL LOW (ref >60)
GFR, EST NON AFRICAN AMERICAN: 23 mL/min — AB (ref >60)
GLUCOSE: 128 mg/dL — AB (ref 65–99)
POTASSIUM: 3.9 mmol/L (ref 3.5–5.1)
Sodium: 140 mmol/L (ref 135–145)

## 2015-12-30 LAB — CBC
HEMATOCRIT: 27.9 % — AB (ref 39.0–52.0)
Hemoglobin: 8.8 g/dL — ABNORMAL LOW (ref 13.0–17.0)
MCH: 24.4 pg — AB (ref 26.0–34.0)
MCHC: 31.5 g/dL (ref 30.0–36.0)
MCV: 77.5 fL — AB (ref 78.0–100.0)
PLATELETS: 144 10*3/uL — AB (ref 150–400)
RBC: 3.6 MIL/uL — AB (ref 4.22–5.81)
RDW: 16.9 % — ABNORMAL HIGH (ref 11.5–15.5)
WBC: 6.9 10*3/uL (ref 4.0–10.5)

## 2015-12-30 LAB — PROTIME-INR
INR: 1.31 (ref 0.00–1.49)
Prothrombin Time: 16.4 seconds — ABNORMAL HIGH (ref 11.6–15.2)

## 2015-12-30 LAB — GLUCOSE, CAPILLARY
GLUCOSE-CAPILLARY: 108 mg/dL — AB (ref 65–99)
GLUCOSE-CAPILLARY: 155 mg/dL — AB (ref 65–99)
Glucose-Capillary: 117 mg/dL — ABNORMAL HIGH (ref 65–99)
Glucose-Capillary: 293 mg/dL — ABNORMAL HIGH (ref 65–99)

## 2015-12-30 MED ORDER — WARFARIN SODIUM 7.5 MG PO TABS
15.0000 mg | ORAL_TABLET | Freq: Once | ORAL | Status: AC
  Administered 2015-12-30: 15 mg via ORAL
  Filled 2015-12-30: qty 2

## 2015-12-30 MED ORDER — ENOXAPARIN SODIUM 30 MG/0.3ML ~~LOC~~ SOLN
30.0000 mg | SUBCUTANEOUS | Status: DC
  Administered 2015-12-31: 30 mg via SUBCUTANEOUS
  Filled 2015-12-30 (×2): qty 0.3

## 2015-12-30 NOTE — Anesthesia Postprocedure Evaluation (Signed)
Anesthesia Post Note  [name]  Procedure(s) Performed: Procedure(s) (LRB): POSTERIOR  APPROACH HEMI HIP ARTHROPLASTY (Left)  Patient location during evaluation: PACU Anesthesia Type: General Level of consciousness: awake and alert Pain management: pain level controlled Vital Signs Assessment: post-procedure vital signs reviewed and stable Respiratory status: spontaneous breathing, nonlabored ventilation, respiratory function stable and patient connected to nasal cannula oxygen Cardiovascular status: blood pressure returned to baseline and stable Postop Assessment: no signs of nausea or vomiting Anesthetic complications: no    Last Vitals:  Filed Vitals:   12/30/15 1247 12/30/15 1645  BP: 98/56 144/109  Pulse: 99   Temp: 36.9 C   Resp: 18     Last Pain:  Filed Vitals:   12/30/15 1648  PainSc: 3                  Mykelle Cockerell DAVID

## 2015-12-30 NOTE — Progress Notes (Signed)
Physical Therapy Treatment Patient Details Name: [name] [name] MRN: [id number] DOB: 1946/04/28 Today's Date: 12/30/2015    History of Present Illness Pt presents for left THA due to femoral neck fx sustained when fell in garden while ambulating with walker. PMH: bilateral BKA, dementia, LBBB, CHF, CAD, DM    PT Comments    Pt progressing towards physical therapy goals. Pt motivated to don prostheses this session and was able to achieve pivotal steps from bed>chair with +2 assist. Pt reports he has been donning prostheses and ambulating with a walker daily at home. Feel this pt may be a good candidate for CIR to improve functional independence and lessen burden of care on family. Will continue to follow.   Follow Up Recommendations  CIR;Supervision/Assistance - 24 hour     Equipment Recommendations  None recommended by PT    Recommendations for Other Services Rehab consult     Precautions / Restrictions Precautions Precautions: Posterior Hip;Fall Precaution Booklet Issued: Yes (comment) Precaution Comments: Needs review for post hip precautions. Bilateral prostheses present in room  Restrictions Weight Bearing Restrictions: Yes LLE Weight Bearing: Weight bearing as tolerated    Mobility  Bed Mobility Overal bed mobility: Needs Assistance;+2 for physical assistance Bed Mobility: Supine to Sit     Supine to sit: Max assist;+2 for physical assistance     General bed mobility comments: Assist provided with bed pad for scooting, and for trunk elevation to full sitting position.   Transfers Overall transfer level: Needs assistance Equipment used: Rolling walker (2 wheeled) Transfers: Sit to/from [employer] Sit to Stand: +2 physical assistance;Max assist Stand pivot transfers: Max assist;+2 physical assistance       General transfer comment: Pt performed sit to stand x2 with B LE prostheses donned. Max assist for trunk control and boosting into  standing however upon second attempt pt showed improvement in speed to full stand. Increased time required for pivotal steps around to the recliner chair.   Ambulation/Gait             General Gait Details: Pt fatigued and reports increased soreness this morning, and gait training deferred to SPT only bed>chair.    Stairs            Wheelchair Mobility    Modified Rankin (Stroke Patients Only)       Balance Overall balance assessment: Needs assistance Sitting-balance support: No upper extremity supported;Feet supported;Feet unsupported Sitting balance-Leahy Scale: Poor Sitting balance - Comments: Sitting balance difficult for pt without prostheses donned and required almost constant assist to maintain upright posture. Improved with B prostheses donned.  Postural control: Posterior lean;Right lateral lean   Standing balance-Leahy Scale: Zero Standing balance comment: +2 required for assist.                     Cognition Arousal/Alertness: Awake/alert Behavior During Therapy: WFL for tasks assessed/performed Overall Cognitive Status: History of cognitive impairments - at baseline       Memory: Decreased short-term memory;Decreased recall of precautions              Exercises      General Comments        Pertinent Vitals/Pain Pain Assessment: Faces Faces Pain Scale: Hurts little more Pain Location: L hip Pain Descriptors / Indicators: Operative site guarding;Sore Pain Intervention(s): Limited activity within patient's tolerance;Monitored during session;Repositioned    Home Living  Prior Function            PT Goals (current goals can now be found in the care plan section) Acute Rehab PT Goals Patient Stated Goal: return home PT Goal Formulation: With patient Time For Goal Achievement: 01/05/16 Potential to Achieve Goals: Good Progress towards PT goals: Progressing toward goals    Frequency  Min  5X/week    PT Plan Discharge plan needs to be updated    Co-evaluation             End of Session Equipment Utilized During Treatment: Gait belt;Other (comment) (Bilateral prostheses) Activity Tolerance: Patient limited by fatigue Patient left: in chair;with call bell/phone within reach     Time: 0841-0906 PT Time Calculation (min) (ACUTE ONLY): 25 min  Charges:  $Gait Training: 23-37 mins                    G Codes:      Rolinda Roan 11-Jan-2016, 10:01 AM  Rolinda Roan, PT, DPT Acute Rehabilitation Services Pager: [phone]

## 2015-12-30 NOTE — Progress Notes (Addendum)
ANTICOAGULATION CONSULT NOTE  Pharmacy Consult:  Coumadin Indication:  Afib  No Known Allergies  Patient Measurements: Height: 5\' 9"  (175.3 cm) Weight: 202 lb 2.6 oz (91.7 kg) IBW/kg (Calculated) : 70.7  Vital Signs: Temp: 99.9 F (37.7 C) (05/07 0552) Temp Source: Oral (05/07 0552) BP: 105/55 mmHg (05/07 0552) Pulse Rate: 90 (05/07 0552)  Labs:  Recent Labs  12/28/15 0550 12/28/15 1202 12/28/15 2245 12/29/15 0551 12/30/15 0344 12/30/15 0452  HGB 11.1*  --  10.9* 9.9* 8.8*  --   HCT 33.9*  --  34.8* 32.2* 27.9*  --   PLT 176  --  168 163 144*  --   APTT 47*  --   --   --   --   --   LABPROT  --  18.8*  --  16.5*  --  16.4*  INR  --  1.57*  --  1.32  --  1.31  CREATININE 1.41*  --  1.66* 1.61*  --  2.63*    Estimated Creatinine Clearance: 28.4 mL/min (by C-G formula based on Cr of 2.63).   Medical History: Past Medical History  Diagnosis Date  . Cardiomyopathy, nonischemic ([place])     a. Cath 2003: mild nonobstructive CAD, EF 25% at that time.  [name] PAF (paroxysmal atrial fibrillation) ([place])     a. Noted on ICD interrogation 2012;  b. coumadin d/c'd 01/2013.  [name] NSVT (nonsustained ventricular tachycardia) ([place])     a. Noted on ICD interrogation in 2011.  . High cholesterol     takes Atorvastatin daily  . PAD (peripheral artery disease) ([place])     a. 08/2013 Periph Angio/PTA: Abd Ao nl, RLE- 3v runoff, PT diff dzs, AT 90p, LLE 2v runoff, PT 100, AT 87m (diamondback ORA/chocolate balloon PTA).  . Automatic implantable cardioverter-defibrillator in situ     a. 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  . Dementia   . Arthritis   . History of blood transfusion     no abnormal reaction noted  . Complication of anesthesia   . PONV (postoperative nausea and vomiting)   . Hypertension     takes Coreg daily  . LBBB (left bundle branch block)     takes Coumadin daily  . Anemia     takes Iron pill daily  . Constipation     takes Sennokot  daily  . GERD (gastroesophageal reflux disease)     takes Nexium daily  . Chronic systolic CHF (congestive heart failure) ([facility])     takes Furosemide daily as well as Aldactone  . Pneumonia     hx of > 62yr ago  . History of bronchitis     > 1 yr ago  . Headache     occasionally  . Peripheral neuropathy ([facility])     hands;numbness and tingling   . Urinary frequency   . Urinary urgency   . History of kidney stones   . Type II diabetes mellitus ([place])     takes Levemir daily as well as Novolog  . CAD (coronary artery disease)   . PAF (paroxysmal atrial fibrillation) ([place])   . CKD (chronic kidney disease), stage III     Assessment: 70 YOM on Coumadin PTA for history of Afib. Patient's Coumadin was reversed for open treatment of femoral fracture today 12/28/15. Pharmacy consulted to resume Coumadin 5/5. INR is sub-therapeutic and has trended down further. Hg down 9.9, plt wnl. No bleeding reported. On lovenox 40 daily until INR therapeutic.  INR cont to be low after 2 doses of coumadin 15mg  due to vit K.   Home regimen: Coumadin 10mg  daily except 15mg  on Sat, last dose 12/22/15 pta  Goal of Therapy:  INR 2-3  Plan:   - Coumadin 15mg  x 1 dose tonight - Lovenox 40mg  daily until INR therapeutic - Daily INR - Monitor s/sx bleeding   Onnie Boer, PharmD Pager: [phone] 12/30/2015 11:11 AM

## 2015-12-30 NOTE — Progress Notes (Signed)
PROGRESS NOTE    [name] [name]  [id number] DOB: 10-Nov-1945 DOA: 12/27/2015 PCP: [name], MD   Outpatient Specialists:     Brief Narrative:  [name] [name] is a 70 y.o. 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  He is coming from home.  S/p partial hip replacement on 5/5.   Assessment & Plan:   Principal Problem:   Closed left hip fracture (HCC) Active Problems:   SYSTOLIC HEART FAILURE, CHRONIC   HTN (hypertension)   BiV ICD (BS).  ICD in '05, BiV ICD 11/10   Critical lower limb ischemia- s/p Rt anterior tibial PTA 12/29/13 in preparation for Rt BKA   PAD (peripheral artery disease) (HCC)   Diabetes mellitus ([place])   Hyperlipidemia   PAF (paroxysmal atrial fibrillation) ([place])   Diabetes mellitus with neuropathy ([place])   CKD (chronic kidney disease), stage III   Closed left hip fracture [facility]):  S/p partial hip replacement 5/5 -PT/OT consult-- has been to CIR in past  Vit D def - PO replacement  Acute kidney injury with acute urinary retention -replace foley -recheck Cr in AM -hold lasix  Chronic combined systolic and diastolic congestive heart failure: 2D echo on 02/15/13 showed EF 20-25 percent with grade 1 diastolic dysfunction. Patient does not have JVD. CHF is compensated on admission. 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 Baseline creatinine 1.3-1.4.  -Follow-up renal function by BMP  Anemia ABLA -expected -monitor with CBC daily   DVT prophylaxis:  coumadin  Code Status: Full  Code   Family Communication: Wife 5/5  Disposition Plan:  PT eval   Consultants:   OrthoErlinda Hong  Procedures:   Partial hip replacement 5/6  Antimicrobials:      Subjective: Has not urinated today  Objective: Filed Vitals:   12/29/15 0631 12/29/15 1234 12/29/15 2132 12/30/15 0552  BP: 107/51 97/42 92/49  105/55  Pulse: 84 80 93 90  Temp: 99.6 F (37.6 C) 99 F (37.2 C) 99.6 F (37.6 C) 99.9 F (37.7 C)  TempSrc: Oral  Oral Oral  Resp: 16 16 16 15   Height:      Weight:      SpO2: 97%  92% 92%    Intake/Output Summary (Last 24 hours) at 12/30/15 1230 Last data filed at 12/30/15 1030  Gross per 24 hour  Intake    120 ml  Output    850 ml  Net   -730 ml   Filed Weights   12/28/15 0145 12/28/15 0640  Weight: 91.3 kg (201 lb 4.5 oz) 91.7 kg (202 lb 2.6 oz)    Examination:  General exam: Appears calm and comfortable  Respiratory system: Clear to auscultation. Respiratory effort normal. Cardiovascular system: S1 & S2 heard, RRR. No JVD, murmurs, rubs, gallops or clicks. Gastrointestinal system: Abdomen is nondistended, soft and nontender. No organomegaly or masses felt. Decrease bowel sounds Central nervous system: Alert and oriented. No focal neurological deficits. B/l amputee   Data Reviewed: I have personally reviewed following labs and imaging studies  CBC:  Recent Labs Lab 12/27/15 2330 12/27/15 2354 12/28/15 0550 12/28/15 2245 12/29/15  [id number] 12/30/15 0344  WBC 7.4  --  6.5 9.0 6.5 6.9  HGB 12.1* 14.3 11.1* 10.9* 9.9* 8.8*  HCT 37.1* 42.0 33.9* 34.8* 32.2* 27.9*  MCV 76.0*  --  77.4* 77.2* 77.8* 77.5*  PLT 177  --  176 168 163 [id number]*   Basic Metabolic Panel:  Recent Labs Lab 12/27/15 2330 12/27/15 2354 12/28/15 0550 12/28/15 2245 12/29/15 0551 12/30/15 0452  NA 140 142 140  --  143 140  K 4.6 4.4 4.6  --  4.0 3.9  CL 107 106 107  --  105 105  CO2 21*  --  22  --  25 24  GLUCOSE 308* 301* 354*  --  195* 128*  BUN 30* 32* 28*  --   25* 35*  CREATININE 1.42* 1.40* 1.41* 1.66* 1.61* 2.63*  CALCIUM 9.3  --  9.0  --  8.2* 8.3*   GFR: Estimated Creatinine Clearance: 28.4 mL/min (by C-G formula based on Cr of 2.63). Liver Function Tests: No results for input(s): AST, ALT, ALKPHOS, BILITOT, PROT, ALBUMIN in the last 168 hours. No results for input(s): LIPASE, AMYLASE in the last 168 hours. No results for input(s): AMMONIA in the last 168 hours. Coagulation Profile:  Recent Labs Lab 12/27/15 2330 12/28/15 1202 12/29/15 0551 12/30/15 0452  INR 1.66* 1.57* 1.32 1.31   Cardiac Enzymes: No results for input(s): CKTOTAL, CKMB, CKMBINDEX, TROPONINI in the last 168 hours. BNP (last 3 results) No results for input(s): PROBNP in the last 8760 hours. HbA1C:  Recent Labs  12/28/15 1202  HGBA1C 9.9*   CBG:  Recent Labs Lab 12/29/15 0626 12/29/15 1226 12/29/15 2134 12/30/15 0627 12/30/15 1110  GLUCAP 177* 189* 179* 108* 117*   Lipid Profile: No results for input(s): CHOL, HDL, LDLCALC, TRIG, CHOLHDL, LDLDIRECT in the last 72 hours. Thyroid Function Tests: No results for input(s): TSH, T4TOTAL, FREET4, T3FREE, THYROIDAB in the last 72 hours. Anemia Panel: No results for input(s): VITAMINB12, FOLATE, FERRITIN, TIBC, IRON, RETICCTPCT in the last 72 hours. Urine analysis:    Component Value Date/Time   COLORURINE YELLOW 01/11/2015 1850   APPEARANCEUR TURBID* 01/11/2015 1850   LABSPEC 1.013 01/11/2015 1850   PHURINE 8.5* 01/11/2015 1850   GLUCOSEU NEGATIVE 01/11/2015 1850   HGBUR MODERATE* 01/11/2015 1850   BILIRUBINUR NEGATIVE 01/11/2015 1850   KETONESUR NEGATIVE 01/11/2015 1850   PROTEINUR 100* 01/11/2015 1850   UROBILINOGEN 0.2 01/11/2015 1850   NITRITE NEGATIVE 01/11/2015 1850   LEUKOCYTESUR LARGE* 01/11/2015 1850    Recent Results (from the past 240 hour(s))  Surgical pcr screen     Status: Abnormal   Collection Time: 12/28/15  1:45 AM  Result Value Ref Range Status   MRSA, PCR NEGATIVE NEGATIVE  Final   Staphylococcus aureus POSITIVE (A) NEGATIVE Final    Comment:        The Xpert SA Assay (FDA approved for NASAL specimens in patients over 33 years of age), is one component of a comprehensive surveillance program.  Test performance has been validated by [facility] for patients greater than or equal to 23 year old. It is not intended to diagnose infection nor to guide or monitor treatment.       Anti-infectives    Start     Dose/Rate Route Frequency Ordered Stop   12/29/15 0000  ceFAZolin (ANCEF) IVPB 2g/100 mL premix     2 g 200 mL/hr over 30 Minutes Intravenous Every 6 hours 12/28/15 2010 12/29/15 1340   12/28/15 1530  ceFAZolin (ANCEF)  IVPB 2g/100 mL premix     2 g 200 mL/hr over 30 Minutes Intravenous To [facility] 12/28/15 0807 12/28/15 1705       Radiology Studies: Pelvis Portable  12/28/2015  CLINICAL DATA:  Left hip arthroplasty. EXAM: PORTABLE PELVIS 1-2 VIEWS COMPARISON:  12/27/2015 FINDINGS: Left hip arthroplasty changes identified without complicating features. Postoperative changes in the soft tissues noted. IMPRESSION: Left hip arthroplasty changes without complicating features. Electronically Signed   By: Margarette Canada M.D.   On: 12/28/2015 20:54        Scheduled Meds: . atorvastatin  10 mg Oral Daily  . carvedilol  12.5 mg Oral BID WC  . Chlorhexidine Gluconate Cloth  6 each Topical Daily  . cholecalciferol  1,000 Units Oral Daily  . clopidogrel  75 mg Oral Q breakfast  . enoxaparin (LOVENOX) injection  40 mg Subcutaneous Q24H  . insulin aspart  0-5 Units Subcutaneous QHS  . insulin aspart  0-9 Units Subcutaneous TID WC  . insulin detemir  20 Units Subcutaneous Daily  . insulin detemir  30 Units Subcutaneous QHS  . iron polysaccharides  150 mg Oral Daily  . multivitamin with minerals  1 tablet Oral Daily  . mupirocin ointment  1 application Nasal BID  . pantoprazole  40 mg Oral Daily  . pregabalin  75 mg Oral BID  .  sacubitril-valsartan  1 tablet Oral BID  . senna-docusate  1 tablet Oral BID  . sodium chloride flush  3 mL Intravenous Q12H  . spironolactone  12.5 mg Oral QHS  . tranexamic acid (CYKLOKAPRON) topical -INTRAOP  2,000 mg Topical Once  . vitamin E  1,000 Units Oral Daily  . warfarin  15 mg Oral ONCE-1800  . Warfarin - Pharmacist Dosing Inpatient   Does not apply q1800   Continuous Infusions: . lactated ringers 10 mL/hr at 12/28/15 1534     LOS: 2 days    Time spent: 35 min    [place], DO [facility] Pager [phone]  If 7PM-7AM, please contact night-coverage [email or web address] Password [email or web address] 12/30/2015, 12:30 PM

## 2015-12-30 NOTE — Progress Notes (Signed)
Patient accidentally removed IV. Spoke with Dr. Eliseo Squires, okay to leave IV out for now. If patient needs IV fluids we will have to reinsert.

## 2015-12-30 NOTE — Discharge Instructions (Signed)
1. Change dressings as needed 2. May shower but keep incisions covered and dry 3. Take lovenox to prevent blood clots 4. Take stool softeners as needed 5. Take pain meds as needed  Information on my medicine - Coumadin   (Warfarin)  This medication education was reviewed with me or my healthcare representative as part of my discharge preparation.  The pharmacist that spoke with me during my hospital stay was:  Romona Curls, [facility]  Why was Coumadin prescribed for you? Coumadin was prescribed for you because you have a blood clot or a medical condition that can cause an increased risk of forming blood clots. Blood clots can cause serious health problems by blocking the flow of blood to the heart, lung, or brain. Coumadin can prevent harmful blood clots from forming. As a reminder your indication for Coumadin is:   Stroke Prevention Because Of Atrial Fibrillation  What test will check on my response to Coumadin? While on Coumadin (warfarin) you will need to have an INR test regularly to ensure that your dose is keeping you in the desired range. The INR (international normalized ratio) number is calculated from the result of the laboratory test called prothrombin time (PT).  If an INR APPOINTMENT HAS NOT ALREADY BEEN MADE FOR YOU please schedule an appointment to have this lab work done by your health care provider within 7 days. Your INR goal is usually a number between:  2 to 3 or your provider may give you a more narrow range like 2-2.5.  Ask your health care provider during an office visit what your goal INR is.  What  do you need to  know  About  COUMADIN? Take Coumadin (warfarin) exactly as prescribed by your healthcare provider about the same time each day.  DO NOT stop taking without talking to the doctor who prescribed the medication.  Stopping without other blood clot prevention medication to take the place of Coumadin may increase your risk of developing a new clot or stroke.  Get  refills before you run out.  What do you do if you miss a dose? If you miss a dose, take it as soon as you remember on the same day then continue your regularly scheduled regimen the next day.  Do not take two doses of Coumadin at the same time.  Important Safety Information A possible side effect of Coumadin (Warfarin) is an increased risk of bleeding. You should call your healthcare provider right away if you experience any of the following: ? Bleeding from an injury or your nose that does not stop. ? Unusual colored urine (red or dark brown) or unusual colored stools (red or black). ? Unusual bruising for unknown reasons. ? A serious fall or if you hit your head (even if there is no bleeding).  Some foods or medicines interact with Coumadin (warfarin) and might alter your response to warfarin. To help avoid this: ? Eat a balanced diet, maintaining a consistent amount of Vitamin K. ? Notify your provider about major diet changes you plan to make. ? Avoid alcohol or limit your intake to 1 drink for women and 2 drinks for men per day. (1 drink is 5 oz. wine, 12 oz. beer, or 1.5 oz. liquor.)  Make sure that ANY health care provider who prescribes medication for you knows that you are taking Coumadin (warfarin).  Also make sure the healthcare provider who is monitoring your Coumadin knows when you have started a new medication including herbals  and non-prescription products.  Coumadin (Warfarin)  Major Drug Interactions  Increased Warfarin Effect Decreased Warfarin Effect  Alcohol (large quantities) Antibiotics (esp. Septra/Bactrim, Flagyl, Cipro) Amiodarone (Cordarone) Aspirin (ASA) Cimetidine (Tagamet) Megestrol (Megace) NSAIDs (ibuprofen, naproxen, etc.) Piroxicam (Feldene) Propafenone (Rythmol SR) Propranolol (Inderal) Isoniazid (INH) Posaconazole (Noxafil) Barbiturates (Phenobarbital) Carbamazepine (Tegretol) Chlordiazepoxide (Librium) Cholestyramine  (Questran) Griseofulvin Oral Contraceptives Rifampin Sucralfate (Carafate) Vitamin K   Coumadin (Warfarin) Major Herbal Interactions  Increased Warfarin Effect Decreased Warfarin Effect  Garlic Ginseng Ginkgo biloba Coenzyme Q10 Green tea St. Johns wort    Coumadin (Warfarin) FOOD Interactions  Eat a consistent number of servings per week of foods HIGH in Vitamin K (1 serving =  cup)  Collards (cooked, or boiled & drained) Kale (cooked, or boiled & drained) Mustard greens (cooked, or boiled & drained) Parsley *serving size only =  cup Spinach (cooked, or boiled & drained) Swiss chard (cooked, or boiled & drained) Turnip greens (cooked, or boiled & drained)  Eat a consistent number of servings per week of foods MEDIUM-HIGH in Vitamin K (1 serving = 1 cup)  Asparagus (cooked, or boiled & drained) Broccoli (cooked, boiled & drained, or raw & chopped) Brussel sprouts (cooked, or boiled & drained) *serving size only =  cup Lettuce, raw (green leaf, endive, romaine) Spinach, raw Turnip greens, raw & chopped   These websites have more information on Coumadin (warfarin):  FailFactory.se; VeganReport.com.au;

## 2015-12-31 ENCOUNTER — Encounter (HOSPITAL_COMMUNITY): Payer: Self-pay | Admitting: Orthopaedic Surgery

## 2015-12-31 DIAGNOSIS — W19XXXD Unspecified fall, subsequent encounter: Secondary | ICD-10-CM

## 2015-12-31 DIAGNOSIS — D696 Thrombocytopenia, unspecified: Secondary | ICD-10-CM

## 2015-12-31 DIAGNOSIS — Z89512 Acquired absence of left leg below knee: Secondary | ICD-10-CM

## 2015-12-31 DIAGNOSIS — R339 Retention of urine, unspecified: Secondary | ICD-10-CM

## 2015-12-31 DIAGNOSIS — Z794 Long term (current) use of insulin: Secondary | ICD-10-CM

## 2015-12-31 DIAGNOSIS — F0390 Unspecified dementia without behavioral disturbance: Secondary | ICD-10-CM

## 2015-12-31 DIAGNOSIS — N183 Chronic kidney disease, stage 3 (moderate): Secondary | ICD-10-CM

## 2015-12-31 DIAGNOSIS — Z96649 Presence of unspecified artificial hip joint: Secondary | ICD-10-CM | POA: Insufficient documentation

## 2015-12-31 DIAGNOSIS — S72002D Fracture of unspecified part of neck of left femur, subsequent encounter for closed fracture with routine healing: Secondary | ICD-10-CM

## 2015-12-31 DIAGNOSIS — I10 Essential (primary) hypertension: Secondary | ICD-10-CM

## 2015-12-31 DIAGNOSIS — D638 Anemia in other chronic diseases classified elsewhere: Secondary | ICD-10-CM

## 2015-12-31 DIAGNOSIS — I5022 Chronic systolic (congestive) heart failure: Secondary | ICD-10-CM

## 2015-12-31 DIAGNOSIS — E1140 Type 2 diabetes mellitus with diabetic neuropathy, unspecified: Secondary | ICD-10-CM

## 2015-12-31 DIAGNOSIS — I739 Peripheral vascular disease, unspecified: Secondary | ICD-10-CM

## 2015-12-31 DIAGNOSIS — N179 Acute kidney failure, unspecified: Secondary | ICD-10-CM | POA: Insufficient documentation

## 2015-12-31 DIAGNOSIS — Z89511 Acquired absence of right leg below knee: Secondary | ICD-10-CM

## 2015-12-31 DIAGNOSIS — Z9660 Presence of unspecified orthopedic joint implant: Secondary | ICD-10-CM

## 2015-12-31 DIAGNOSIS — D62 Acute posthemorrhagic anemia: Secondary | ICD-10-CM

## 2015-12-31 LAB — GLUCOSE, CAPILLARY
GLUCOSE-CAPILLARY: 201 mg/dL — AB (ref 65–99)
GLUCOSE-CAPILLARY: 234 mg/dL — AB (ref 65–99)
GLUCOSE-CAPILLARY: 218 mg/dL — AB (ref 65–99)
Glucose-Capillary: 231 mg/dL — ABNORMAL HIGH (ref 65–99)
Glucose-Capillary: 275 mg/dL — ABNORMAL HIGH (ref 65–99)

## 2015-12-31 LAB — BASIC METABOLIC PANEL
ANION GAP: 12 (ref 5–15)
BUN: 49 mg/dL — ABNORMAL HIGH (ref 6–20)
CO2: 23 mmol/L (ref 22–32)
Calcium: 7.9 mg/dL — ABNORMAL LOW (ref 8.9–10.3)
Chloride: 103 mmol/L (ref 101–111)
Creatinine, Ser: 2.61 mg/dL — ABNORMAL HIGH (ref 0.61–1.24)
GFR, EST AFRICAN AMERICAN: 27 mL/min — AB (ref >60)
GFR, EST NON AFRICAN AMERICAN: 23 mL/min — AB (ref >60)
GLUCOSE: 247 mg/dL — AB (ref 65–99)
POTASSIUM: 3.9 mmol/L (ref 3.5–5.1)
SODIUM: 138 mmol/L (ref 135–145)

## 2015-12-31 LAB — CBC
HEMATOCRIT: 26.6 % — AB (ref 39.0–52.0)
HEMOGLOBIN: 8.6 g/dL — AB (ref 13.0–17.0)
MCH: 24.7 pg — ABNORMAL LOW (ref 26.0–34.0)
MCHC: 32.3 g/dL (ref 30.0–36.0)
MCV: 76.4 fL — ABNORMAL LOW (ref 78.0–100.0)
Platelets: 132 10*3/uL — ABNORMAL LOW (ref 150–400)
RBC: 3.48 MIL/uL — ABNORMAL LOW (ref 4.22–5.81)
RDW: 16.7 % — ABNORMAL HIGH (ref 11.5–15.5)
WBC: 6.4 10*3/uL (ref 4.0–10.5)

## 2015-12-31 LAB — PROTIME-INR
INR: 1.31 (ref 0.00–1.49)
Prothrombin Time: 16.4 seconds — ABNORMAL HIGH (ref 11.6–15.2)

## 2015-12-31 MED ORDER — SODIUM CHLORIDE 0.9 % IV SOLN
INTRAVENOUS | Status: DC
  Administered 2015-12-31: 10:00:00 via INTRAVENOUS

## 2015-12-31 MED ORDER — WARFARIN SODIUM 7.5 MG PO TABS
20.0000 mg | ORAL_TABLET | Freq: Once | ORAL | Status: AC
  Administered 2016-01-01: 20 mg via ORAL
  Filled 2015-12-31: qty 1

## 2015-12-31 MED ORDER — ACETAMINOPHEN 325 MG PO TABS
650.0000 mg | ORAL_TABLET | Freq: Four times a day (QID) | ORAL | Status: DC
Start: 2015-12-31 — End: 2016-01-02
  Administered 2015-12-31 – 2016-01-02 (×9): 650 mg via ORAL
  Filled 2015-12-31 (×9): qty 2

## 2015-12-31 MED ORDER — BISACODYL 10 MG RE SUPP
10.0000 mg | Freq: Once | RECTAL | Status: AC
  Administered 2015-12-31: 10 mg via RECTAL
  Filled 2015-12-31: qty 1

## 2015-12-31 NOTE — Progress Notes (Signed)
Physical Therapy Treatment Patient Details Name: [name] [name] MRN: [id number] DOB: [birth year] Today's Date: 12/31/2015    History of Present Illness Pt presents for left THA due to femoral neck fx sustained when fell in garden while ambulating with walker. PMH: bilateral BKA, dementia, LBBB, CHF, CAD, DM    PT Comments    Pt progressing towards physical therapy goals. Was able to perform transfers with +2 assist and bilateral prostheses donned. Pt requires increased time and effort to complete SPT bed>chair, however with increased cueing was able to do so. Will continue to follow and progress as able per POC.   Follow Up Recommendations  CIR;Supervision/Assistance - 24 hour     Equipment Recommendations  None recommended by PT    Recommendations for Other Services Rehab consult     Precautions / Restrictions Precautions Precautions: Posterior Hip;Fall Precaution Booklet Issued: Yes (comment) Precaution Comments: Needs review for post hip precautions. Bilateral prostheses present in room  Restrictions Weight Bearing Restrictions: Yes LLE Weight Bearing: Weight bearing as tolerated    Mobility  Bed Mobility Overal bed mobility: Needs Assistance;+2 for physical assistance Bed Mobility: Supine to Sit     Supine to sit: Max assist;+2 for physical assistance     General bed mobility comments: Assist provided with bed pad for scooting, and for trunk elevation to full sitting position. Increased time for pt to gain/maintain balance this session.   Transfers Overall transfer level: Needs assistance Equipment used: Rolling walker (2 wheeled) Transfers: Sit to/from [facility] Sit to Stand: +2 physical assistance;Max assist Stand pivot transfers: Max assist;+2 physical assistance       General transfer comment: Pt performed sit to stand with B LE prostheses donned. Max assist for trunk control and boosting into standing. Increased time required for  pivotal steps around to the recliner chair.   Ambulation/Gait                 Stairs            Wheelchair Mobility    Modified Rankin (Stroke Patients Only)       Balance Overall balance assessment: Needs assistance Sitting-balance support: Single extremity supported Sitting balance-Leahy Scale: Poor Sitting balance - Comments: Sitting balance difficult for pt without prostheses donned and required almost constant assist to maintain upright posture. Improved with B prostheses donned.  Postural control: Posterior lean Standing balance support: Bilateral upper extremity supported;During functional activity Standing balance-Leahy Scale: Zero Standing balance comment: +2 required.                     Cognition Arousal/Alertness: Awake/alert Behavior During Therapy: WFL for tasks assessed/performed Overall Cognitive Status: History of cognitive impairments - at baseline       Memory: Decreased short-term memory;Decreased recall of precautions              Exercises      General Comments        Pertinent Vitals/Pain Pain Assessment: Faces Faces Pain Scale: Hurts little more Pain Location: L hip Pain Descriptors / Indicators: Operative site guarding;Sore Pain Intervention(s): Limited activity within patient's tolerance;Monitored during session;Repositioned    Home Living                      Prior Function            PT Goals (current goals can now be found in the care plan section) Acute Rehab PT Goals Patient Stated Goal: return home PT  Goal Formulation: With patient Time For Goal Achievement: 01/05/16 Potential to Achieve Goals: Good Progress towards PT goals: Progressing toward goals    Frequency  Min 5X/week    PT Plan Discharge plan needs to be updated    Co-evaluation             End of Session Equipment Utilized During Treatment: Gait belt;Other (comment) (Bilateral prostheses) Activity Tolerance: Patient  limited by fatigue Patient left: in chair;with call bell/phone within reach;with chair alarm set     Time: 1335-1400 PT Time Calculation (min) (ACUTE ONLY): 25 min  Charges:  $Therapeutic Activity: 23-37 mins                    G Codes:      Rolinda Roan 01-15-16, 2:29 PM   Rolinda Roan, PT, DPT Acute Rehabilitation Services Pager: [phone]

## 2015-12-31 NOTE — Care Management Note (Signed)
Case Management Note  Patient Details  Name: [name] [name] MRN: [id number] Date of Birth: 11-05-1945  Subjective/Objective:             Admitted with left hip fracture, s/p left hip arthroplasty       Action/Plan: PT recommended CIR, CIR recommending SNF. Referral made to CSW, [place] working on SNF placement for short term rehab. Will continue to follow.  Expected Discharge Date:                  Expected Discharge Plan:  Skilled Nursing Facility  In-House Referral:  Clinical Social Work  Discharge planning Services  CM Consult  Post Acute Care Choice:    Choice offered to:     DME Arranged:    DME Agency:     HH Arranged:    [place] Agency:     Status of Service:  In process, will continue to follow  Medicare Important Message Given:    Date Medicare IM Given:    Medicare IM give by:    Date Additional Medicare IM Given:    Additional Medicare Important Message give by:     If discussed at [place] of Stay Meetings, dates discussed:    Additional Comments:  Nila Nephew, RN 12/31/2015, 3:18 PM

## 2015-12-31 NOTE — Clinical Social Work Note (Signed)
Clinical Social Work Assessment  Patient Details  Name: [name] [name] MRN: [id number] Date of Birth: 1945/09/03  Date of referral:  12/31/15               Reason for consult:  Facility Placement, Discharge Planning                Permission sought to share information with:  Facility Sport and exercise psychologist, Family Supports Permission granted to share information::  Yes, Verbal Permission Granted  Name::     [name]::  [facility] SNF ([place] preference)  Relationship::  Wife  Contact Information:  [phone]  Housing/Transportation Living arrangements for the past 2 months:  Single Family Home Source of Information:  Spouse Patient Interpreter Needed:  None Criminal Activity/Legal Involvement Pertinent to Current Situation/Hospitalization:  No - Comment as needed Significant Relationships:  Spouse Lives with:  Spouse Do you feel safe going back to the place where you live?  Yes Need for family participation in patient care:  Yes (Comment) (Patient's wife active in patient's care.)  Care giving concerns:  Patient's wife expressed no concerns at this time.    Social Worker assessment / plan:  CSW received referral for possible SNF placement at time of discharge. CSW spoke with patient's wife who informed CSW that patient and patient's wife would prefer for patient to be discharged to CIR, but are agreeable to SNF placement. Patient's wife informed CSW that patient's wife would prefer [facility] [place] if patient would need to be placed in SNF. CSW to continue to follow and assist with discharge planning needs.  Employment status:  Retired Science writer) PT Recommendations:  [place] / Referral to community resources:  [place]  Patient/Family's Response to care:  Patient's wife understanding and agreeable to CSW plan of care.  Patient/Family's  Understanding of and Emotional Response to Diagnosis, Current Treatment, and Prognosis:  Patient's wife understanding and agreeable to CSW plan of care.  Emotional Assessment Appearance:  Appears stated age Attitude/Demeanor/Rapport:  Other (Appropriate) Affect (typically observed):  Accepting, Appropriate, Pleasant Orientation:  Oriented to Self, Oriented to Place, Oriented to  Time, Oriented to Situation Alcohol / Substance use:  Not Applicable Psych involvement (Current and /or in the community):  No (Comment) (Not appropriate on this admission.)  Discharge Needs  Concerns to be addressed:  No discharge needs identified Readmission within the last 30 days:  No Current discharge risk:  None Barriers to Discharge:  No Barriers Identified   Caroline Sauger, LCSW 12/31/2015, 3:01 PM

## 2015-12-31 NOTE — Progress Notes (Signed)
Admissions coordinator to follow up with pt./family with IP Rehab recommendation for SNF.    [facility] Admissions Coordinator Cell [phone] Office [phone]

## 2015-12-31 NOTE — Progress Notes (Signed)
   Subjective:  Patient reports pain as moderate.  No events.  Objective:   VITALS:   Filed Vitals:   12/30/15 0552 12/30/15 1247 12/30/15 1645 12/30/15 2156  BP: 105/55 98/56 144/109 110/58  Pulse: 90 99  93  Temp: 99.9 F (37.7 C) 98.4 F (36.9 C)  98.2 F (36.8 C)  TempSrc: Oral Oral  Oral  Resp: 15 18  16   Height:      Weight:      SpO2: 92% 94%      Neurologically intact Neurovascular intact Sensation intact distally Intact pulses distally Dorsiflexion/Plantar flexion intact Incision: dressing C/D/I and no drainage No cellulitis present Compartment soft   Lab Results  Component Value Date   WBC 6.4 12/31/2015   HGB 8.6* 12/31/2015   HCT 26.6* 12/31/2015   MCV 76.4* 12/31/2015   PLT 132* 12/31/2015     Assessment/Plan:  3 Days Post-Op   - Expected postop acute blood loss anemia - will monitor for symptoms - Up with PT/OT - DVT ppx - SCDs, ambulation, lovenox bridge to coumadin - WBAT operative extremity - Pain control - Discharge planning - CIR vs SNF  Marianna Payment 12/31/2015, 6:47 AM [phone]

## 2015-12-31 NOTE — Progress Notes (Signed)
ANTICOAGULATION CONSULT NOTE  Pharmacy Consult:  Coumadin Indication:  Afib  No Known Allergies  Patient Measurements: Height: 5\' 9"  (175.3 cm) Weight: 202 lb 2.6 oz (91.7 kg) IBW/kg (Calculated) : 70.7  Vital Signs: Temp: 98.3 F (36.8 C) (05/08 0655) Temp Source: Oral (05/08 0655) BP: 94/53 mmHg (05/08 0655) Pulse Rate: 79 (05/08 0655)  Labs:  Recent Labs  12/29/15 0551 12/30/15 0344 12/30/15 0452 12/31/15 0315  HGB 9.9* 8.8*  --  8.6*  HCT 32.2* 27.9*  --  26.6*  PLT 163 144*  --  132*  LABPROT 16.5*  --  16.4* 16.4*  INR 1.32  --  1.31 1.31  CREATININE 1.61*  --  2.63* 2.61*    Estimated Creatinine Clearance: 28.6 mL/min (by C-G formula based on Cr of 2.61).   Assessment: 70 YOM on Coumadin PTA for history of Afib. Patient's Coumadin was reversed for open treatment of femoral fracture today 12/28/15. Pharmacy consulted to resume Coumadin 5/5.   INR is sub-therapeutic  On lovenox 40 daily until INR therapeutic.   INR cont to be low after 3 doses of coumadin 15mg  due to vit K.   Home regimen: Coumadin 10mg  daily except 15mg  on Sat, last dose 12/22/15 pta  Goal of Therapy:  INR 2-3  Plan:  - Coumadin 20mg  x 1 dose tonight - Lovenox 40mg  daily until INR therapeutic - Daily INR - Monitor s/sx bleeding  Thank you Anette Guarneri, PharmD [phone] 12/31/2015 8:58 AM

## 2015-12-31 NOTE — Clinical Social Work Placement (Signed)
   CLINICAL SOCIAL WORK PLACEMENT  NOTE  Date:  12/31/2015  Patient Details  Name: [name] [name] MRN: [id number] Date of Birth: 26-Dec-1945  Clinical Social Work is seeking post-discharge placement for this patient at the [place] level of care (*CSW will initial, date and re-position this form in  chart as items are completed):  Yes   Patient/family provided with [facility] Work Department's list of facilities offering this level of care within the geographic area requested by the patient (or if unable, by the patient's family).  Yes   Patient/family informed of their freedom to choose among providers that offer the needed level of care, that participate in Medicare, Medicaid or managed care program needed by the patient, have an available bed and are willing to accept the patient.  Yes   Patient/family informed of [facility]'s ownership interest in [facility] and [facility], as well as of the fact that they are under no obligation to receive care at these facilities.  PASRR submitted to EDS on       PASRR number received on       Existing PASRR number confirmed on 12/31/15     FL2 transmitted to all facilities in geographic area requested by pt/family on 12/31/15     FL2 transmitted to all facilities within larger geographic area on       Patient informed that his/her managed care company has contracts with or will negotiate with certain facilities, including the following:            Patient/family informed of bed offers received.  Patient chooses bed at       Physician recommends and patient chooses bed at      Patient to be transferred to   on  .  Patient to be transferred to facility by       Patient family notified on   of transfer.  Name of family member notified:        PHYSICIAN Please sign FL2     Additional Comment:    _______________________________________________ Caroline Sauger, LCSW 12/31/2015, 3:17  PM

## 2015-12-31 NOTE — NC FL2 (Signed)
[place] LEVEL OF CARE SCREENING TOOL     IDENTIFICATION  Patient Name: [name] [name] Birthdate: July 02, 1946 Sex: male Admission Date (Current Location): 12/27/2015  [facility] and [place] Number:  Herbalist and Address:  [facility], [place] [address] [place], [place] [address]      Provider Number: [id number]  Attending Physician Name and Address:  Geradine Girt, DO  Relative Name and Phone Number:       Current Level of Care: Hospital Recommended Level of Care: [place] Prior Approval Number:    Date Approved/Denied:   PASRR Number: [id number]  Discharge Plan: SNF    Current Diagnoses: Patient Active Problem List   Diagnosis Date Noted  . Closed left hip fracture ([place]) 12/28/2015  . CKD (chronic kidney disease), stage III 12/28/2015  . Fall   . Diabetes mellitus with neuropathy ([place]) 01/10/2015  . Amputation of right lower extremity below knee ([place]) 01/08/2015  . Below knee amputation status ([place]) 01/03/2015  . Atrial fibrillation [I48.91] 11/01/2014  . Encounter for therapeutic drug monitoring 11/01/2014  . S/P bilateral BKA (below knee amputation) ([place]) 02/02/2014  . Osteomyelitis of right foot ([place]) 01/30/2014  . 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 ([place]) 01/04/2014  . Osteomyelitis of toe of right foot ([place]) 01/04/2014  . S/P Lt BKA 11/09/13 11/14/2013  . Acute blood loss anemia 11/11/2013  . Type II or unspecified type diabetes mellitus 11/09/2013  . Lower limb amputation, great toe ([place]) 10/11/2013  . Lower limb amputation, other toe(s) 10/11/2013  . Chronic osteomyelitis of toe of left foot ([place]) 10/04/2013  . Foot osteomyelitis, left ([place]) 10/04/2013  . Uncontrolled pain, Lt toe 09/21/2013  . Gangrenous toe, Lt toe 09/21/2013  . PAD (peripheral artery disease) ([place]) 09/13/2013  . Diabetes mellitus ([place]) 09/13/2013  . Hyperlipidemia 09/13/2013  . PAF (paroxysmal atrial  fibrillation) ([place]) 09/13/2013  . Critical lower limb ischemia- s/p Rt anterior tibial PTA 12/29/13 in preparation for Rt BKA 08/30/2013  . 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 HTN (hypertension) 04/08/2011  . NICM- EF 20-25% echo 6/14 09/29/2008  . LBBB 09/29/2008  . SYSTOLIC HEART FAILURE, CHRONIC 09/29/2008    Orientation RESPIRATION BLADDER Height & Weight     Self, Situation, Time, Place  Normal Continent Weight: 202 lb 2.6 oz (91.7 kg) Height:  5\' 9"  (175.3 cm)  BEHAVIORAL SYMPTOMS/MOOD NEUROLOGICAL BOWEL NUTRITION STATUS      Continent Diet (Please see discharge summary.)  AMBULATORY STATUS COMMUNICATION OF NEEDS Skin    (Bilateral BKA) Verbally Surgical wounds                       Personal Care Assistance Level of Assistance  Bathing, Feeding, Dressing Bathing Assistance: Maximum assistance Feeding assistance: Independent Dressing Assistance: Maximum assistance     Functional Limitations Info             SPECIAL CARE FACTORS FREQUENCY  PT (By licensed PT), OT (By licensed OT)     PT Frequency: 5 OT Frequency: 5            Contractures      Additional Factors Info  Code Status, Allergies Code Status Info: FULL Allergies Info: No known allergies           Current Medications (12/31/2015):  This is the current hospital active medication list Current Facility-Administered Medications  Medication Dose Route Frequency Provider  Last Rate Last Dose  . 0.9 %  sodium chloride infusion   Intravenous Continuous Geradine Girt, DO 75 mL/hr at 12/31/15 0945    . acetaminophen (TYLENOL) tablet 650 mg  650 mg Oral Q6H Jessica U Vann, DO   650 mg at 12/31/15 1243  . alum & mag hydroxide-simeth (MAALOX/MYLANTA) 200-200-20 MG/5ML suspension 30 mL  30 mL Oral Q4H PRN Naiping Ephriam Jenkins, MD      . atorvastatin (LIPITOR) tablet 10 mg  10 mg Oral Daily Ivor Costa, MD   10 mg at 12/31/15 0735  . carvedilol (COREG) tablet 12.5 mg  12.5 mg Oral BID WC Ivor Costa, MD   12.5 mg at 12/30/15 1645  . Chlorhexidine Gluconate Cloth 2 % PADS 6 each  6 each Topical Daily Geradine Girt, DO   6 each at 12/31/15 0736  . cholecalciferol (VITAMIN D) tablet 1,000 Units  1,000 Units Oral Daily Geradine Girt, DO   1,000 Units at 12/31/15 0736  . clopidogrel (PLAVIX) tablet 75 mg  75 mg Oral Q breakfast Leandrew Koyanagi, MD   75 mg at 12/31/15 0737  . enoxaparin (LOVENOX) injection 30 mg  30 mg Subcutaneous Q24H Romona Curls, RPH   30 mg at 12/31/15 0737  . HYDROcodone-acetaminophen (NORCO/VICODIN) 5-325 MG per tablet 1-2 tablet  1-2 tablet Oral Q6H PRN Leandrew Koyanagi, MD   2 tablet at 12/30/15 2217  . insulin aspart (novoLOG) injection 0-5 Units  0-5 Units Subcutaneous QHS Geradine Girt, DO   3 Units at 12/30/15 2216  . insulin aspart (novoLOG) injection 0-9 Units  0-9 Units Subcutaneous TID WC Geradine Girt, DO   5 Units at 12/31/15 1243  . insulin detemir (LEVEMIR) injection 20 Units  20 Units Subcutaneous Daily Ivor Costa, MD   20 Units at 12/31/15 1046  . insulin detemir (LEVEMIR) injection 30 Units  30 Units Subcutaneous QHS Ivor Costa, MD   30 Units at 12/30/15 2211  . iron polysaccharides (NIFEREX) capsule 150 mg  150 mg Oral Daily Ivor Costa, MD   150 mg at 12/31/15 1047  . lactated ringers infusion   Intravenous Continuous Leandrew Koyanagi, MD 10 mL/hr at 12/28/15 1534    . menthol-cetylpyridinium (CEPACOL) lozenge 3 mg  1 lozenge Oral PRN Naiping Ephriam Jenkins, MD       Or  . phenol (CHLORASEPTIC) mouth spray 1 spray  1 spray Mouth/Throat PRN Naiping Ephriam Jenkins, MD      . methocarbamol (ROBAXIN) tablet 500 mg  500 mg Oral Q6H PRN Leandrew Koyanagi, MD   500 mg at 12/30/15 0745   Or  . methocarbamol (ROBAXIN) 500 mg in dextrose 5 % 50 mL IVPB  500 mg Intravenous Q6H PRN Naiping Ephriam Jenkins, MD      . metoCLOPramide (REGLAN) tablet 5-10 mg  5-10 mg Oral Q8H PRN Naiping Ephriam Jenkins, MD       Or  . metoCLOPramide (REGLAN) injection 5-10 mg  5-10 mg Intravenous Q8H PRN Naiping Ephriam Jenkins, MD      . morphine  2 MG/ML injection 0.5 mg  0.5 mg Intravenous Q2H PRN Leandrew Koyanagi, MD      . multivitamin with minerals tablet 1 tablet  1 tablet Oral Daily Ivor Costa, MD   1 tablet at 12/31/15 [phone]  . mupirocin ointment (BACTROBAN) 2 % 1 application  1 application Nasal BID Geradine Girt, DO   1 application at [id number]  .  ondansetron (ZOFRAN) tablet 4 mg  4 mg Oral Q6H PRN Naiping Ephriam Jenkins, MD       Or  . ondansetron [facility]) injection 4 mg  4 mg Intravenous Q6H PRN Leandrew Koyanagi, MD   4 mg at 12/29/15 0750  . oxyCODONE (Oxy IR/ROXICODONE) immediate release tablet 5-10 mg  5-10 mg Oral Q4H PRN Leandrew Koyanagi, MD   5 mg at 12/30/15 0747  . oxyCODONE-acetaminophen (PERCOCET/ROXICET) 5-325 MG per tablet 2 tablet  2 tablet Oral Q4H PRN Ivor Costa, MD   2 tablet at 12/30/15 1127  . pantoprazole (PROTONIX) EC tablet 40 mg  40 mg Oral Daily Ivor Costa, MD   40 mg at 12/31/15 0739  . pregabalin (LYRICA) capsule 75 mg  75 mg Oral BID Ivor Costa, MD   75 mg at 12/31/15 0739  . sacubitril-valsartan (ENTRESTO) 24-26 mg per tablet  1 tablet Oral BID Ivor Costa, MD   1 tablet at 12/31/15 1048  . senna-docusate (Senokot-S) tablet 1 tablet  1 tablet Oral BID Ivor Costa, MD   1 tablet at 12/31/15 0740  . sodium chloride flush (NS) 0.9 % injection 3 mL  3 mL Intravenous Q12H Ivor Costa, MD   3 mL at 12/31/15 0740  . tranexamic acid (CYKLOKAPRON) 2,000 mg in sodium chloride 0.9 % 50 mL Topical Application  [id number] mg Topical Once Naiping Ephriam Jenkins, MD      . vitamin E capsule 1,000 Units  1,000 Units Oral Daily Ivor Costa, MD   1,000 Units at 12/31/15 1048  . Warfarin - Pharmacist Dosing Inpatient   Does not apply [place], [facility]         Discharge Medications: Please see discharge summary for a list of discharge medications.  Relevant Imaging Results:  Relevant Lab Results:   Additional Information SSN: [id number]  Caroline Sauger, LCSW

## 2015-12-31 NOTE — Consult Note (Signed)
Physical Medicine and Rehabilitation Consult   Reason for Consult:  Left femoral neck fracture Referring Physician: Dr. Eliseo Squires   HPI: [name] [name] is a 70 y.o. male with history of  CAD with NICM, chronic systolic CHF,  123456 with neuropathy,  PAF, PAD s/p B-BKA, CKD,  dementia who sustained a mechanical fall in the garden on 12/27/15 with complaints of left hip pain due to left femoral neck fracture.  He was evaluated by Dr. Erlinda Hong and underwent Left hip hemi on 12/28/15. Post op WBAT and on coumadin for DVT prophylaxis. Post op with acute on chronic renal failure as well as urinary retention cath for 949 cc last pm--foley to be replaced?  Diuretics held and he was started on IVF for hydration.   Wife had questions regarding CIR LOS v/s 21 days at [facility] or transition to SNF after CIR. Discussed that this would depend on evaluation, progress and goals would likely be at [facility] level due to inability to wear LLE prosthesis due to post op edema.  Discussed wearing left shrinker at all times for edema control.   Review of Systems  Eyes: Negative for blurred vision and double vision.  Respiratory: Negative for cough and shortness of breath.   Cardiovascular: Negative for chest pain and palpitations.  Gastrointestinal: Positive for heartburn and constipation. Negative for vomiting.  Genitourinary: Negative for dysuria and urgency.  Musculoskeletal: Positive for myalgias, back pain and joint pain.  Neurological: Positive for weakness. Negative for dizziness and sensory change.  Psychiatric/Behavioral: Positive for memory loss.  All other systems reviewed and are negative.     Past Medical History  Diagnosis Date  . Cardiomyopathy, nonischemic ([place])     a. Cath 2003: mild nonobstructive CAD, EF 25% at that time.  [name] PAF (paroxysmal atrial fibrillation) ([place])     a. Noted on ICD interrogation 2012;  b. coumadin d/c'd 01/2013.  [name] NSVT (nonsustained ventricular tachycardia) ([place])     a. Noted  on ICD interrogation in 2011.  . High cholesterol     takes Atorvastatin daily  . PAD (peripheral artery disease) ([place])     a. 08/2013 Periph Angio/PTA: Abd Ao nl, RLE- 3v runoff, PT diff dzs, AT 90p, LLE 2v runoff, PT 100, AT 72m (diamondback ORA/chocolate balloon PTA).  . Automatic implantable cardioverter-defibrillator in situ     a. 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  . Dementia   . Arthritis   . History of blood transfusion     no abnormal reaction noted  . Complication of anesthesia   . PONV (postoperative nausea and vomiting)   . Hypertension     takes Coreg daily  . LBBB (left bundle branch block)     takes Coumadin daily  . Anemia     takes Iron pill daily  . Constipation     takes Sennokot daily  . GERD (gastroesophageal reflux disease)     takes Nexium daily  . Chronic systolic CHF (congestive heart failure) (H[facility])     takes Furosemide daily as well as Aldactone  . Pneumonia     hx of > 4yr ago  . History of bronchitis     > 1 yr ago  . Headache     occasionally  . Peripheral neuropathy ([facility])     hands;numbness and tingling   . Urinary frequency   . Urinary urgency   . History of kidney stones   . Type II diabetes mellitus ([place])  takes Levemir daily as well as Novolog  . CAD (coronary artery disease)   . PAF (paroxysmal atrial fibrillation) ([place])   . CKD (chronic kidney disease), stage III     Past Surgical History  Procedure Laterality Date  . Lithotripsy  2001  . Cervical spine surgery  1994  . Cardiac defibrillator placement  06/2009    WITH GENERATOR REPLACED; BiV ICD  . US echocardiography  03/21/2008    EF 30-35%  . Cardiovascular stress test  03/20/2009    EF 33%  . Transluminal atherectomy tibial artery Left 09/12/2013  . Toe amputation  10/04/2013    LEFT GREAT TOE AND 4TH TOE   /   DR Erlinda Hong  . Amputation Left 10/04/2013    Procedure: LEFT GREAT TOE AND SECOND TOE AMPUTATION;  Surgeon: Marianna Payment, MD;   Location: [place];  Service: Orthopedics;  Laterality: Left;  . Colonoscopy    . Leg amputation below knee Left 11/09/2013    DR Erlinda Hong  . Amputation Left 11/09/2013    Procedure: LEFT AMPUTATION BELOW KNEE;  Surgeon: Marianna Payment, MD;  Location: [place];  Service: Orthopedics;  Laterality: Left;  . Amputation Right 01/04/2014    Procedure: Doristine Devoid second and third toe amputation;  Surgeon: Marianna Payment, MD;  Location: [place];  Service: Orthopedics;  Laterality: Right;  . Amputation Right 01/30/2014    Procedure: RIGHT BELOW KNEE AMPUTATION;  Surgeon: Marianna Payment, MD;  Location: [place];  Service: Orthopedics;  Laterality: Right;  . Lower extremity angiogram Bilateral 09/12/2013    Procedure: LOWER EXTREMITY ANGIOGRAM;  Surgeon: Lorretta Harp, MD;  Location: [facility] CATH LAB;  Service: Cardiovascular;  Laterality: Bilateral;  . Abdominal angiogram  09/12/2013    Procedure: ABDOMINAL ANGIOGRAM;  Surgeon: Lorretta Harp, MD;  Location: [facility] CATH LAB;  Service: Cardiovascular;;  . Lower extremity angiogram N/A 12/29/2013    Procedure: LOWER EXTREMITY ANGIOGRAM;  Surgeon: Lorretta Harp, MD;  Location: [facility] CATH LAB;  Service: Cardiovascular;  Laterality: N/A;  . Atherectomy Right 12/29/2013    Procedure: ATHERECTOMY;  Surgeon: Lorretta Harp, MD;  Location: [facility] CATH LAB;  Service: Cardiovascular;  Laterality: Right;  Anterior Tibial Artery  . Cardiac catheterization  10/01/2001    THERE WAS GLOBAL HYPOKINESIS AND EF 25%. THERE APPEARED TO BE GLOBAL DECREASE IN WALL MOTION  . Esophagogastroduodenoscopy    . Stump revision Left 01/03/2015    Procedure: LEFT BELOW KNEE AMPUTATION REVISION;  Surgeon: Leandrew Koyanagi, MD;  Location: [place];  Service: Orthopedics;  Laterality: Left;    Family History  Problem Relation Age of Onset  . Heart disease Mother   . Hypertension Mother   . Diabetes Mother   . Diabetes Father      Social History:   Married. Independent with walker PTA. Wife at home and can  provide supervision. He reports that he has never smoked. He has never used smokeless tobacco. He reports that he does not drink alcohol or use illicit drugs.   Allergies: No Known Allergies   Medications Prior to Admission  Medication Sig Dispense Refill  . atorvastatin (LIPITOR) 10 MG tablet Take 1 tablet (10 mg total) by mouth daily. 30 tablet 1  . carvedilol (COREG) 12.5 MG tablet Take 1 tablet (12.5 mg total) by mouth 2 (two) times daily with a meal. 60 tablet 1  . cholecalciferol (VITAMIN D) 1000 UNITS tablet Take 1 tablet (1,000 Units total) by mouth daily. 30 tablet 1  .  clopidogrel (PLAVIX) 75 MG tablet Take 1 tablet (75 mg total) by mouth daily with breakfast. 30 tablet 1  . esomeprazole (NEXIUM) 40 MG capsule Take 1 capsule (40 mg total) by mouth daily as needed (acid reflux). 30 capsule 1  . furosemide (LASIX) 40 MG tablet Take 1 tablet (40 mg total) by mouth 2 (two) times daily. 60 tablet 1  . insulin detemir (LEVEMIR) 100 UNIT/ML injection 30 unit AM and 40 unit PM 10 mL 11  . iron polysaccharides (NIFEREX) 150 MG capsule Take 1 capsule (150 mg total) by mouth daily. 30 capsule 1  . Multiple Vitamin (MULTIVITAMIN WITH MINERALS) TABS tablet Take 1 tablet by mouth daily.    . potassium chloride SA (K-DUR,KLOR-CON) 20 MEQ tablet Take 1 tablet (20 mEq total) by mouth 2 (two) times daily. 60 tablet 1  . pregabalin (LYRICA) 75 MG capsule Take 1 capsule (75 mg total) by mouth 2 (two) times daily. 60 capsule 1  . sacubitril-valsartan (ENTRESTO) 24-26 MG Take 1 tablet by mouth 2 (two) times daily. 60 tablet 6  . senna-docusate (SENOKOT-S) 8.6-50 MG per tablet Take 1 tablet by mouth 2 (two) times daily. 60 tablet 1  . spironolactone (ALDACTONE) 25 MG tablet Take 0.5 tablets (12.5 mg total) by mouth at bedtime. 30 tablet 1  . vitamin E (VITAMIN E) 1000 UNIT capsule Take 1,000 Units by mouth daily.    [name] warfarin (COUMADIN) 10 MG tablet TAKE AS DIRECTED BY COUMADIN CLINIC 100 tablet 0  .  warfarin (COUMADIN) 5 MG tablet TAKE AS DIRECTED BY COUMADIN CLINIC 50 tablet 3  . aspirin 81 MG EC tablet Take 1 tablet (81 mg total) by mouth daily. 30 tablet 11  . sulfamethoxazole-trimethoprim (BACTRIM DS,SEPTRA DS) 800-160 MG per tablet Take 1 tablet by mouth every 12 (twelve) hours. 10 tablet 0    Home: Home Living Family/patient expects to be discharged to:: Private residence Living Arrangements: Spouse/significant other Available Help at Discharge: Family, Available 24 hours/day Type of Home: House Home Access: Stairs to enter [employer] of Steps: 2 Entrance Stairs-Rails: Left Home Layout: One level Bathroom Shower/Tub: Chiropodist: Handicapped height Bathroom Accessibility: No Home Equipment: Environmental consultant - 2 wheels, Wheelchair - manual, Tub bench, Bedside commode Additional Comments: per last admission, pt sponge bathes  Functional History: Prior Function Level of Independence: Needs assistance Gait / Transfers Assistance Needed: pt dons bilateral prostheses each day and ambulates with RW. W/c for community distances ADL's / Homemaking Assistance Needed: Pt reports he only takes sponge baths and is able to complete BADLs independenlty. From last admission, pt's brother stays with him while wife is at work and helps him as needed.  Functional Status:  Mobility: Bed Mobility Overal bed mobility: Needs Assistance, +2 for physical assistance Bed Mobility: Supine to Sit Supine to sit: Max assist, +2 for physical assistance General bed mobility comments: Assist provided with bed pad for scooting, and for trunk elevation to full sitting position.  Transfers Overall transfer level: Needs assistance Equipment used: Rolling walker (2 wheeled) Transfers: Sit to/from Stand, Stand Pivot Transfers Sit to Stand: +2 physical assistance, Max assist Stand pivot transfers: Max assist, +2 physical assistance Anterior-Posterior transfers: +2 physical assistance,  Max assist General transfer comment: Pt performed sit to stand x2 with B LE prostheses donned. Max assist for trunk control and boosting into standing however upon second attempt pt showed improvement in speed to full stand. Increased time required for pivotal steps around to the recliner chair.  Ambulation/Gait  Ambulation/Gait assistance: Max assist Ambulation Distance (Feet): 6 Feet Assistive device: Rolling walker (2 wheeled) Gait Pattern/deviations: Step-to pattern, Shuffle, Trunk flexed, Leaning posteriorly General Gait Details: Pt fatigued and reports increased soreness this morning, and gait training deferred to SPT only bed>chair.     ADL: ADL Overall ADL's : Needs assistance/impaired Eating/Feeding: Set up, Sitting Grooming: Set up, Sitting Upper Body Bathing: Minimal assitance, Sitting Lower Body Bathing: Total assistance, +2 for physical assistance, Sitting/lateral leans Upper Body Dressing : Minimal assistance, Sitting Lower Body Dressing: Total assistance, +2 for physical assistance, Sitting/lateral leans General ADL Comments: Only assessed pt on A-P transfer secondary to pt not having prostheses. Discussed SNF placement and pt is agreeable; would like [facility] if possible.   Cognition: Cognition Overall Cognitive Status: History of cognitive impairments - at baseline Orientation Level: Oriented X4 Cognition Arousal/Alertness: Awake/alert Behavior During Therapy: WFL for tasks assessed/performed Overall Cognitive Status: History of cognitive impairments - at baseline Memory: Decreased short-term memory, Decreased recall of precautions   Blood pressure 94/53, pulse 79, temperature 98.3 F (36.8 C), temperature source Oral, resp. rate 16, height 5\' 9"  (1.753 m), weight 91.7 kg (202 lb 2.6 oz), SpO2 98 %. Physical Exam  Nursing note and vitals reviewed. Constitutional: He is oriented to person, place, and time. He appears well-developed and well-nourished.  HENT:  Head:  Normocephalic and atraumatic.  Mouth/Throat: Oropharynx is clear and moist.  Eyes: Conjunctivae are normal. Pupils are equal, round, and reactive to light.  Neck: Normal range of motion. Neck supple.  Cardiovascular: Normal rate.  An irregular rhythm present.  Respiratory: Effort normal. No stridor. No respiratory distress. He has no wheezes.  GI: Soft. He exhibits distension. Bowel sounds are decreased. There is no tenderness.  Musculoskeletal: He exhibits edema and tenderness.  Moderate edema left hip and left residual limb.   Neurological: He is alert and oriented to person, place, and time.  Able to follow simple motor commands.  Memory deficits noted.  A&O x2 Sensation intact to light touch Motor: Bilateral upper extremity, right lower extremity hip flexion: 5/5   Skin: Skin is warm and dry.  Psychiatric: He has a normal mood and affect. His speech is normal and behavior is normal. Cognition and memory are impaired.    Results for orders placed or performed during the hospital encounter of 12/27/15 (from the past 24 hour(s))  Glucose, capillary     Status: Abnormal   Collection Time: 12/30/15  4:19 PM  Result Value Ref Range   Glucose-Capillary 155 (H) 65 - 99 mg/dL  Glucose, capillary     Status: Abnormal   Collection Time: 12/30/15  9:58 PM  Result Value Ref Range   Glucose-Capillary 293 (H) 65 - 99 mg/dL  CBC     Status: Abnormal   Collection Time: 12/31/15  3:15 AM  Result Value Ref Range   WBC 6.4 4.0 - 10.5 K/uL   RBC 3.48 (L) 4.22 - 5.81 MIL/uL   Hemoglobin 8.6 (L) 13.0 - 17.0 g/dL   HCT 26.6 (L) 39.0 - 52.0 %   MCV 76.4 (L) 78.0 - 100.0 fL   MCH 24.7 (L) 26.0 - 34.0 pg   MCHC 32.3 30.0 - 36.0 g/dL   RDW 16.7 (H) 11.5 - 15.5 %   Platelets 132 (L) 150 - 400 K/uL  Protime-INR     Status: Abnormal   Collection Time: 12/31/15  3:15 AM  Result Value Ref Range   Prothrombin Time 16.4 (H) 11.6 - 15.2 seconds  INR 1.31 0.00 - 99991111  Basic metabolic panel     Status:  Abnormal   Collection Time: 12/31/15  3:15 AM  Result Value Ref Range   Sodium 138 135 - 145 mmol/L   Potassium 3.9 3.5 - 5.1 mmol/L   Chloride 103 101 - 111 mmol/L   CO2 23 22 - 32 mmol/L   Glucose, Bld 247 (H) 65 - 99 mg/dL   BUN 49 (H) 6 - 20 mg/dL   Creatinine, Ser 2.61 (H) 0.61 - 1.24 mg/dL   Calcium 7.9 (L) 8.9 - 10.3 mg/dL   GFR calc non Af Amer 23 (L) >60 mL/min   GFR calc Af Amer 27 (L) >60 mL/min   Anion gap 12 5 - 15  Glucose, capillary     Status: Abnormal   Collection Time: 12/31/15  6:56 AM  Result Value Ref Range   Glucose-Capillary 201 (H) 65 - 99 mg/dL   No results found.  Assessment/Plan: Diagnosis: Left femoral neck fracture s/p hemiarthroplasty Oral pharmacological pain control  Bowel program: consider colace, miralax and/or Senna. PRN suppository Fall precautions Monitor surgical wound and skin especially over pressure sensitive areas Prevent immobility complications: pressure ulcers, contractures, HO Consider modalities, such as TENs  1. Does the need for close, 24 hr/day medical supervision in concert with the patient's rehab needs make it unreasonable for this patient to be served in a less intensive setting? Potentially 2. Co-Morbidities requiring supervision/potential complications: CAD with NICM (cont meds), chronic systolic CHF (monitor weights, continue meds),  T2DM with neuropathy (Monitor in accordance with exercise and adjust meds as necessary), PAF (continue meds, monitor heart rate with increased activity), PAD s/p B-BKA continue stump shrinkers), dementia, acute on chronic renal failure (avoid nephrotoxic meds), urinary retention (DC Foley to reduce chance of infection), Thrombocytopenia (< 60,000/mm3 no resistive exercise), acute on chronic anemia (transfuse if necessary to ensure appropriate perfusion for increased activity tolerance) 3. Due to safety, skin/wound care, disease management, pain management and patient education, does the patient  require 24 hr/day rehab nursing? Yes 4. Does the patient require coordinated care of a physician, rehab nurse, PT (1-2 hrs/day, 5 days/week) and OT (1-2 hrs/day, 5 days/week) to address physical and functional deficits in the context of the above medical diagnosis(es)? Yes Addressing deficits in the following areas: balance, endurance, locomotion, transferring, bathing, dressing, toileting and psychosocial support 5. Can the patient actively participate in an intensive therapy program of at least 3 hrs of therapy per day at least 5 days per week? Yes 6. The potential for patient to make measurable gains while on inpatient rehab is excellent 7. Anticipated functional outcomes upon discharge from inpatient rehab are supervision and min assist  with PT, supervision and min assist with OT, n/a with SLP. 8. Estimated rehab length of stay to reach the above functional goals is: 12-16 days. 9. Does the patient have adequate social supports and living environment to accommodate these discharge functional goals? Potentially 10. Anticipated D/C setting: Other 11. Anticipated post D/C treatments: HH therapy and Home excercise program 12. Overall Rehab/Functional Prognosis: good  RECOMMENDATIONS: This patient's condition is appropriate for continued rehabilitative care in the following setting: Given current functional level, patient's wife will unlikely to be able to care for patient. Patient will also have a difficult time donning and doffing left lower extremity extremity prosthesis given dementia and hip precautions. Further, patient prefers to go to [facility] as this is close to his home. Agree with patient, recommend SNF at this time.  Patient has agreed to participate in recommended program. Yes Note that insurance prior authorization may be required for reimbursement for recommended care.  Comment: Rehab Admissions Coordinator to follow up.  Delice Lesch, MD 12/31/2015

## 2015-12-31 NOTE — Progress Notes (Signed)
PROGRESS NOTE    [name] [name]  [id number] DOB: Jun 24, 1946 DOA: 12/27/2015 PCP: [name], MD   Outpatient Specialists:     Brief Narrative:  [name] [name] is a 70 y.o. 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  He is coming from home.  S/p partial hip replacement on 5/5.   Assessment & Plan:   Principal Problem:   Closed left hip fracture (HCC) Active Problems:   SYSTOLIC HEART FAILURE, CHRONIC   HTN (hypertension)   BiV ICD (BS).  ICD in '05, BiV ICD 11/10   Critical lower limb ischemia- s/p Rt anterior tibial PTA 12/29/13 in preparation for Rt BKA   PAD (peripheral artery disease) (HCC)   Diabetes mellitus ([place])   Hyperlipidemia   PAF (paroxysmal atrial fibrillation) ([place])   Diabetes mellitus with neuropathy ([place])   CKD (chronic kidney disease), stage III   Closed left hip fracture [facility]):  S/p partial hip replacement 5/5 -PT/OT consult-- has been to CIR in past  Vit D def - PO replacement  Acute kidney injury with acute urinary retention -replace foley -recheck Cr in AM -hold lasix/aldactone -gentle IVF -u/a  Chronic combined systolic and diastolic congestive heart failure: 2D echo on 02/15/13 showed EF 20-25 percent with grade 1 diastolic dysfunction. Patient does not have JVD. CHF is compensated on admission. 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 Baseline creatinine 1.3-1.4.  -Follow-up renal function by BMP  Anemia ABLA -expected -monitor with CBC daily   DVT prophylaxis:    coumadin  Code Status: Full Code   Family Communication: Wife 5/5  Disposition Plan:  PT eval- CIR vs SNF   Consultants:   OrthoErlinda Hong  Procedures:   Partial hip replacement 5/6  Antimicrobials:      Subjective: Appears to be in pain No SOB, no CP  Objective: Filed Vitals:   12/30/15 1247 12/30/15 1645 12/30/15 2156 12/31/15 0655  BP: 98/56 144/109 110/58 94/53  Pulse: 99  93 79  Temp: 98.4 F (36.9 C)  98.2 F (36.8 C) 98.3 F (36.8 C)  TempSrc: Oral  Oral Oral  Resp: 18  16 16   Height:      Weight:      SpO2: 94%   98%    Intake/Output Summary (Last 24 hours) at 12/31/15 1127 Last data filed at 12/31/15 0740  Gross per 24 hour  Intake    243 ml  Output      0 ml  Net    243 ml   Filed Weights   12/28/15 0145 12/28/15 0640  Weight: 91.3 kg (201 lb 4.5 oz) 91.7 kg (202 lb 2.6 oz)    Examination:  General exam: Appears calm and comfortable  Respiratory system: Clear to auscultation. Respiratory effort normal. Cardiovascular system: S1 & S2 heard, RRR. No JVD, murmurs, rubs, gallops or clicks. Gastrointestinal system: Abdomen is nondistended, soft and nontender. No organomegaly or masses felt. Decrease bowel sounds Central nervous system: Alert and oriented. No focal neurological deficits. B/l amputee   Data Reviewed: I have personally reviewed following labs and imaging studies  CBC:  Recent Labs Lab 12/28/15 0550 12/28/15 2245 12/29/15 0551 12/30/15 0344 12/31/15 0315  WBC 6.5 9.0 6.5 6.9 6.4  HGB 11.1* 10.9* 9.9* 8.8* 8.6*  HCT 33.9* 34.8* 32.2* 27.9* 26.6*  MCV 77.4* 77.2* 77.8* 77.5* 76.4*  PLT 176 168 163 144* [id number]*   Basic Metabolic Panel:  Recent Labs Lab 12/27/15 2330 12/27/15 2354 12/28/15 0550 12/28/15 2245 12/29/15 0551 12/30/15 0452 12/31/15 0315  NA 140 142 140  --  143 140 138  K 4.6 4.4 4.6  --  4.0 3.9 3.9  CL 107 106 107  --  105 105 103  CO2 21*  --  22  --  25 24 23   GLUCOSE 308* 301* 354*  --  195*  128* 247*  BUN 30* 32* 28*  --  25* 35* 49*  CREATININE 1.42* 1.40* 1.41* 1.66* 1.61* 2.63* 2.61*  CALCIUM 9.3  --  9.0  --  8.2* 8.3* 7.9*   GFR: Estimated Creatinine Clearance: 28.6 mL/min (by C-G formula based on Cr of 2.61). Liver Function Tests: No results for input(s): AST, ALT, ALKPHOS, BILITOT, PROT, ALBUMIN in the last 168 hours. No results for input(s): LIPASE, AMYLASE in the last 168 hours. No results for input(s): AMMONIA in the last 168 hours. Coagulation Profile:  Recent Labs Lab 12/27/15 2330 12/28/15 1202 12/29/15 0551 12/30/15 0452 12/31/15 0315  INR 1.66* 1.57* 1.32 1.31 1.31   Cardiac Enzymes: No results for input(s): CKTOTAL, CKMB, CKMBINDEX, TROPONINI in the last 168 hours. BNP (last 3 results) No results for input(s): PROBNP in the last 8760 hours. HbA1C:  Recent Labs  12/28/15 1202  HGBA1C 9.9*   CBG:  Recent Labs Lab 12/30/15 0627 12/30/15 1110 12/30/15 1619 12/30/15 2158 12/31/15 0656  GLUCAP 108* 117* 155* 293* 201*   Lipid Profile: No results for input(s): CHOL, HDL, LDLCALC, TRIG, CHOLHDL, LDLDIRECT in the last 72 hours. Thyroid Function Tests: No results for input(s): TSH, T4TOTAL, FREET4, T3FREE, THYROIDAB in the last 72 hours. Anemia Panel: No results for input(s): VITAMINB12, FOLATE, FERRITIN, TIBC, IRON, RETICCTPCT in the last 72 hours. Urine analysis:    Component Value Date/Time   COLORURINE YELLOW 01/11/2015 1850   APPEARANCEUR TURBID* 01/11/2015 1850   LABSPEC 1.013 01/11/2015 1850   PHURINE 8.5* 01/11/2015 1850   GLUCOSEU NEGATIVE 01/11/2015 1850   HGBUR MODERATE* 01/11/2015 1850   BILIRUBINUR NEGATIVE 01/11/2015 1850   KETONESUR NEGATIVE 01/11/2015 1850   PROTEINUR 100* 01/11/2015 1850   UROBILINOGEN 0.2 01/11/2015 1850   NITRITE NEGATIVE 01/11/2015 1850   LEUKOCYTESUR LARGE* 01/11/2015 1850    Recent Results (from the past 240 hour(s))  Surgical pcr screen     Status: Abnormal   Collection Time: 12/28/15   1:45 AM  Result Value Ref Range Status   MRSA, PCR NEGATIVE NEGATIVE Final   Staphylococcus aureus POSITIVE (A) NEGATIVE Final    Comment:        The Xpert SA Assay (FDA approved for NASAL specimens in patients over 97 years of age), is one component of a comprehensive surveillance program.  Test performance has been validated by [facility] for patients greater than or equal to 82 year old. It is not intended to diagnose infection nor to guide or monitor treatment.       Anti-infectives    Start     Dose/Rate Route Frequency Ordered Stop   12/29/15 0000  ceFAZolin (ANCEF) IVPB 2g/100 mL premix     2 g 200 mL/hr over 30 Minutes Intravenous Every 6  hours 12/28/15 2010 12/29/15 1340   12/28/15 1530  ceFAZolin (ANCEF) IVPB 2g/100 mL premix     2 g 200 mL/hr over 30 Minutes Intravenous To [facility] 12/28/15 0807 12/28/15 1705       Radiology Studies: No results found.      Scheduled Meds: . acetaminophen  650 mg Oral Q6H  . atorvastatin  10 mg Oral Daily  . carvedilol  12.5 mg Oral BID WC  . Chlorhexidine Gluconate Cloth  6 each Topical Daily  . cholecalciferol  1,000 Units Oral Daily  . clopidogrel  75 mg Oral Q breakfast  . enoxaparin (LOVENOX) injection  30 mg Subcutaneous Q24H  . insulin aspart  0-5 Units Subcutaneous QHS  . insulin aspart  0-9 Units Subcutaneous TID WC  . insulin detemir  20 Units Subcutaneous Daily  . insulin detemir  30 Units Subcutaneous QHS  . iron polysaccharides  150 mg Oral Daily  . multivitamin with minerals  1 tablet Oral Daily  . mupirocin ointment  1 application Nasal BID  . pantoprazole  40 mg Oral Daily  . pregabalin  75 mg Oral BID  . sacubitril-valsartan  1 tablet Oral BID  . senna-docusate  1 tablet Oral BID  . sodium chloride flush  3 mL Intravenous Q12H  . tranexamic acid (CYKLOKAPRON) topical -INTRAOP  2,000 mg Topical Once  . vitamin E  1,000 Units Oral Daily  . Warfarin - Pharmacist Dosing Inpatient   Does  not apply q1800   Continuous Infusions: . sodium chloride 75 mL/hr at 12/31/15 0945  . lactated ringers 10 mL/hr at 12/28/15 1534     LOS: 3 days    Time spent: 35 min    [place], DO [facility] Pager [phone]  If 7PM-7AM, please contact night-coverage [email or web address] Password [facility] 12/31/2015, 11:27 AM

## 2015-12-31 NOTE — Progress Notes (Signed)
Inpatient Diabetes Program Recommendations  AACE/ADA: New Consensus Statement on Inpatient Glycemic Control (2015)  Target Ranges:  Prepandial:   less than 140 mg/dL      Peak postprandial:   less than 180 mg/dL (1-2 hours)      Critically ill patients:  140 - 180 mg/dL   Review of Glycemic Control:  Results for BOYKIN, COSPER (MRN [id number]) as of 12/31/2015 13:54  Ref. Range 12/30/2015 06:27 12/30/2015 11:10 12/30/2015 16:19 12/30/2015 21:58 12/31/2015 06:56 12/31/2015 12:15  Glucose-Capillary Latest Ref Range: 65-99 mg/dL 108 (H) 117 (H) 155 (H) 293 (H) 201 (H) 275 (H)   Diabetes history: Diabetes mellitus Outpatient Diabetes medications: Levemir 30 units q AM and 40 units q PM Current orders for Inpatient glycemic control:  Novolog sensitive tid with meals and HS, Levemir 20 units q AM and 30 units q PM  Inpatient Diabetes Program Recommendations:  Consider adding Novolog meal coverage 5 units tid with meals (hold if patient eats less than 50%).  Thanks, Adah Perl, RN, BC-ADM Inpatient Diabetes Coordinator Pager [phone] (8a-5p)

## 2016-01-01 LAB — URINE MICROSCOPIC-ADD ON

## 2016-01-01 LAB — URINALYSIS, ROUTINE W REFLEX MICROSCOPIC
BILIRUBIN URINE: NEGATIVE
Glucose, UA: 100 mg/dL — AB
KETONES UR: NEGATIVE mg/dL
Leukocytes, UA: NEGATIVE
NITRITE: NEGATIVE
PH: 5.5 (ref 5.0–8.0)
Protein, ur: NEGATIVE mg/dL
SPECIFIC GRAVITY, URINE: 1.011 (ref 1.005–1.030)

## 2016-01-01 LAB — BASIC METABOLIC PANEL
Anion gap: 12 (ref 5–15)
BUN: 38 mg/dL — AB (ref 6–20)
CALCIUM: 7.8 mg/dL — AB (ref 8.9–10.3)
CHLORIDE: 104 mmol/L (ref 101–111)
CO2: 23 mmol/L (ref 22–32)
CREATININE: 1.49 mg/dL — AB (ref 0.61–1.24)
GFR calc Af Amer: 52 mL/min — ABNORMAL LOW (ref >60)
GFR calc non Af Amer: 45 mL/min — ABNORMAL LOW (ref >60)
GLUCOSE: 242 mg/dL — AB (ref 65–99)
Potassium: 4.2 mmol/L (ref 3.5–5.1)
Sodium: 139 mmol/L (ref 135–145)

## 2016-01-01 LAB — PROTIME-INR
INR: 1.66 — AB (ref 0.00–1.49)
Prothrombin Time: 19.6 seconds — ABNORMAL HIGH (ref 11.6–15.2)

## 2016-01-01 LAB — CBC
HEMATOCRIT: 27 % — AB (ref 39.0–52.0)
HEMOGLOBIN: 8.8 g/dL — AB (ref 13.0–17.0)
MCH: 25.2 pg — AB (ref 26.0–34.0)
MCHC: 32.6 g/dL (ref 30.0–36.0)
MCV: 77.4 fL — AB (ref 78.0–100.0)
Platelets: 168 10*3/uL (ref 150–400)
RBC: 3.49 MIL/uL — ABNORMAL LOW (ref 4.22–5.81)
RDW: 16.7 % — AB (ref 11.5–15.5)
WBC: 6.2 10*3/uL (ref 4.0–10.5)

## 2016-01-01 LAB — GLUCOSE, CAPILLARY
GLUCOSE-CAPILLARY: 272 mg/dL — AB (ref 65–99)
GLUCOSE-CAPILLARY: 192 mg/dL — AB (ref 65–99)
GLUCOSE-CAPILLARY: 196 mg/dL — AB (ref 65–99)
GLUCOSE-CAPILLARY: 201 mg/dL — AB (ref 65–99)

## 2016-01-01 MED ORDER — INSULIN GLARGINE 100 UNIT/ML ~~LOC~~ SOLN
35.0000 [IU] | Freq: Every day | SUBCUTANEOUS | Status: DC
  Administered 2016-01-01: 35 [IU] via SUBCUTANEOUS
  Filled 2016-01-01 (×2): qty 0.35

## 2016-01-01 MED ORDER — INSULIN ASPART 100 UNIT/ML ~~LOC~~ SOLN
4.0000 [IU] | Freq: Three times a day (TID) | SUBCUTANEOUS | Status: DC
  Administered 2016-01-01 – 2016-01-02 (×2): 4 [IU] via SUBCUTANEOUS

## 2016-01-01 MED ORDER — INSULIN GLARGINE 100 UNIT/ML ~~LOC~~ SOLN
25.0000 [IU] | Freq: Every day | SUBCUTANEOUS | Status: DC
  Administered 2016-01-02: 25 [IU] via SUBCUTANEOUS
  Filled 2016-01-01: qty 0.25

## 2016-01-01 MED ORDER — INSULIN DETEMIR 100 UNIT/ML ~~LOC~~ SOLN: 25.0000 [IU] | Freq: Every day | SUBCUTANEOUS | Status: DC

## 2016-01-01 MED ORDER — ENOXAPARIN SODIUM 40 MG/0.4ML ~~LOC~~ SOLN
40.0000 mg | SUBCUTANEOUS | Status: DC
  Administered 2016-01-02: 40 mg via SUBCUTANEOUS
  Filled 2016-01-01: qty 0.4

## 2016-01-01 MED ORDER — INSULIN DETEMIR 100 UNIT/ML ~~LOC~~ SOLN
35.0000 [IU] | Freq: Every day | SUBCUTANEOUS | Status: DC
  Filled 2016-01-01: qty 0.35

## 2016-01-01 MED ORDER — TAMSULOSIN HCL 0.4 MG PO CAPS
0.4000 mg | ORAL_CAPSULE | Freq: Every day | ORAL | Status: DC
  Administered 2016-01-01: 0.4 mg via ORAL
  Filled 2016-01-01: qty 1

## 2016-01-01 NOTE — Progress Notes (Addendum)
PROGRESS NOTE    [name] [name]  [id number] DOB: 03/29/1946 DOA: 12/27/2015 PCP: [name], MD   Outpatient Specialists:     Brief Narrative:  [name] [name] is a 70 y.o. 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  He is coming from home.  S/p partial hip replacement on 5/5.  Course complicated by AKI due to urinary retention and dehydration.  Hope for SNF in AM-- patient requests [facility]   Assessment & Plan:   Principal Problem:   Closed left hip fracture ([place]) Active Problems:   SYSTOLIC HEART FAILURE, CHRONIC   HTN (hypertension)   BiV ICD (BS).  ICD in '05, BiV ICD 11/10   Critical lower limb ischemia- s/p Rt anterior tibial PTA 12/29/13 in preparation for Rt BKA   PAD (peripheral artery disease) (HCC)   Diabetes mellitus ([place])   Hyperlipidemia   PAF (paroxysmal atrial fibrillation) ([place])   Diabetes mellitus with neuropathy ([place])   CKD (chronic kidney disease), stage III   Status post hip hemiarthroplasty   Urinary retention   Dementia   AKI (acute kidney injury) (HCC)   Thrombocytopenia (HCC)   Anemia of chronic disease   Closed left hip fracture ([place]):  S/p partial hip replacement 5/5 -PT/OT consult-- SNF  Vit D def - PO replacement  Acute kidney injury with acute urinary retention -replace foley- plan on voiding trial at SNF in 1 week or so -recheck Cr in AM -hold lasix/aldactone -gentle IVF -u/a flomax  Chronic combined systolic and diastolic congestive heart failure: 2D echo on 02/15/13 showed EF 20-25 percent with grade 1 diastolic dysfunction. Patient does not have JVD. CHF is compensated on admission. -Continue aspirin and Coreg  HTN: - Coreg  PAF: CHA2DS2-VASc Score is 4 -resume home coumadin  DM-II:  Last A1c 9.9, poorly controled -levemir -SSI  HLD: Last LDL was 98  -Continue home medications: Lipitor  CKD (chronic kidney disease), stage III: Stable. Baseline creatinine 1.3-1.4.  -Follow-up renal function by BMP  Anemia ABLA -expected -monitor with CBC daily   DVT prophylaxis:  coumadin  Code Status: Full Code   Family Communication: Wife 5/5- called cell 5/9 no answer  Disposition Plan:  PT eval- CIR vs SNF   Consultants:   Manson PasseyErlinda Hong  Procedures:   Partial hip replacement 5/6  Antimicrobials:      Subjective: No overnight events Eating better  Objective: Filed Vitals:   12/31/15 0655 12/31/15 1155 12/31/15 2100 01/01/16 0612  BP: 94/53 98/48 117/57 120/53  Pulse: 79 81 92 93  Temp: 98.3 F (36.8 C) 98.1 F (36.7 C) 98.4 F (36.9 C) 98.3 F (36.8 C)  TempSrc: Oral Oral Oral   Resp: 16 18 18 18   Height:      Weight:      SpO2: 98% 97% 95% 97%    Intake/Output Summary (Last 24 hours) at 01/01/16 1207 Last data filed at 01/01/16 1100  Gross per 24 hour  Intake    240 ml  Output   3400 ml  Net  -3160 ml   Filed Weights   12/28/15 0145 12/28/15 0640  Weight: 91.3 kg (201 lb 4.5 oz) 91.7 kg (202 lb 2.6 oz)    Examination:  General exam: Appears calm and comfortable  Respiratory system: Clear to auscultation. Respiratory effort normal. Cardiovascular system: S1 &  S2 heard, RRR. No JVD, murmurs, rubs, gallops or clicks.  Gastrointestinal system: Abdomen is nondistended, soft and nontender. No organomegaly or masses felt. + bowel sounds Central nervous system: Alert and oriented. No focal neurological deficits. B/l amputee   Data Reviewed: I have personally reviewed following labs and imaging studies  CBC:  Recent Labs Lab 12/28/15 2245 12/29/15 0551 12/30/15 0344 12/31/15 0315 01/01/16 0537  WBC 9.0 6.5 6.9 6.4 6.2  HGB 10.9* 9.9* 8.8* 8.6* 8.8*  HCT 34.8* 32.2* 27.9* 26.6* 27.0*  MCV 77.2* 77.8* 77.5* 76.4* 77.4*  PLT  168 163 144* 132* [id number]   Basic Metabolic Panel:  Recent Labs Lab 12/28/15 0550 12/28/15 2245 12/29/15 0551 12/30/15 0452 12/31/15 0315 01/01/16 0537  NA 140  --  143 140 138 139  K 4.6  --  4.0 3.9 3.9 4.2  CL 107  --  105 105 103 104  CO2 22  --  25 24 23 23   GLUCOSE 354*  --  195* 128* 247* 242*  BUN 28*  --  25* 35* 49* 38*  CREATININE 1.41* 1.66* 1.61* 2.63* 2.61* 1.49*  CALCIUM 9.0  --  8.2* 8.3* 7.9* 7.8*   GFR: Estimated Creatinine Clearance: 50.1 mL/min (by C-G formula based on Cr of 1.49). Liver Function Tests: No results for input(s): AST, ALT, ALKPHOS, BILITOT, PROT, ALBUMIN in the last 168 hours. No results for input(s): LIPASE, AMYLASE in the last 168 hours. No results for input(s): AMMONIA in the last 168 hours. Coagulation Profile:  Recent Labs Lab 12/28/15 1202 12/29/15 0551 12/30/15 0452 12/31/15 0315 01/01/16 0537  INR 1.57* 1.32 1.31 1.31 1.66*   Cardiac Enzymes: No results for input(s): CKTOTAL, CKMB, CKMBINDEX, TROPONINI in the last 168 hours. BNP (last 3 results) No results for input(s): PROBNP in the last 8760 hours. HbA1C: No results for input(s): HGBA1C in the last 72 hours. CBG:  Recent Labs Lab 12/31/15 0656 12/31/15 1215 12/31/15 1613 12/31/15 2146 01/01/16 0657  GLUCAP 201* 275* 234* 218* 272*   Lipid Profile: No results for input(s): CHOL, HDL, LDLCALC, TRIG, CHOLHDL, LDLDIRECT in the last 72 hours. Thyroid Function Tests: No results for input(s): TSH, T4TOTAL, FREET4, T3FREE, THYROIDAB in the last 72 hours. Anemia Panel: No results for input(s): VITAMINB12, FOLATE, FERRITIN, TIBC, IRON, RETICCTPCT in the last 72 hours. Urine analysis:    Component Value Date/Time   COLORURINE YELLOW 01/01/2016 0155   APPEARANCEUR CLEAR 01/01/2016 0155   LABSPEC 1.011 01/01/2016 0155   PHURINE 5.5 01/01/2016 0155   GLUCOSEU 100* 01/01/2016 0155   HGBUR MODERATE* 01/01/2016 0155   BILIRUBINUR NEGATIVE 01/01/2016 0155   KETONESUR  NEGATIVE 01/01/2016 0155   PROTEINUR NEGATIVE 01/01/2016 0155   UROBILINOGEN 0.2 01/11/2015 1850   NITRITE NEGATIVE 01/01/2016 0155   LEUKOCYTESUR NEGATIVE 01/01/2016 0155    Recent Results (from the past 240 hour(s))  Surgical pcr screen     Status: Abnormal   Collection Time: 12/28/15  1:45 AM  Result Value Ref Range Status   MRSA, PCR NEGATIVE NEGATIVE Final   Staphylococcus aureus POSITIVE (A) NEGATIVE Final    Comment:        The Xpert SA Assay (FDA approved for NASAL specimens in patients over 47 years of age), is one component of a comprehensive surveillance program.  Test performance has been validated by [facility] for patients greater than or equal to 75 year old. It is not intended to diagnose infection nor to guide or monitor treatment.  Anti-infectives    Start     Dose/Rate Route Frequency Ordered Stop   12/29/15 0000  ceFAZolin (ANCEF) IVPB 2g/100 mL premix     2 g 200 mL/hr over 30 Minutes Intravenous Every 6 hours 12/28/15 2010 12/29/15 1340   12/28/15 1530  ceFAZolin (ANCEF) IVPB 2g/100 mL premix     2 g 200 mL/hr over 30 Minutes Intravenous To [facility] 12/28/15 0807 12/28/15 1705       Radiology Studies: No results found.      Scheduled Meds: . acetaminophen  650 mg Oral Q6H  . atorvastatin  10 mg Oral Daily  . carvedilol  12.5 mg Oral BID WC  . cholecalciferol  1,000 Units Oral Daily  . clopidogrel  75 mg Oral Q breakfast  . [START ON 01/02/2016] enoxaparin (LOVENOX) injection  40 mg Subcutaneous Q24H  . insulin aspart  0-5 Units Subcutaneous QHS  . insulin aspart  0-9 Units Subcutaneous TID WC  . insulin aspart  4 Units Subcutaneous TID WC  . [START ON 01/02/2016] insulin detemir  25 Units Subcutaneous Daily  . insulin detemir  35 Units Subcutaneous QHS  . iron polysaccharides  150 mg Oral Daily  . multivitamin with minerals  1 tablet Oral Daily  . mupirocin ointment  1 application Nasal BID  . pantoprazole  40 mg Oral  Daily  . pregabalin  75 mg Oral BID  . sacubitril-valsartan  1 tablet Oral BID  . senna-docusate  1 tablet Oral BID  . sodium chloride flush  3 mL Intravenous Q12H  . tranexamic acid (CYKLOKAPRON) topical -INTRAOP  2,000 mg Topical Once  . vitamin E  1,000 Units Oral Daily  . warfarin  20 mg Oral ONCE-1800  . Warfarin - Pharmacist Dosing Inpatient   Does not apply q1800   Continuous Infusions: . lactated ringers 10 mL/hr at 12/28/15 1534     LOS: 4 days    Time spent: 25 min    [place], DO [facility] Pager [phone]  If 7PM-7AM, please contact night-coverage [email or web address] Password [email or web address] 01/01/2016, 12:07 PM

## 2016-01-01 NOTE — Progress Notes (Signed)
Inpatient Rehabilitation  I met with the patient and his son at the bedside to discuss results of IP Rehab consult.  Pt. Indicates he has been to CIR in the past but believes he will need a longer recovery period than is afforded by CIR at this time.  We discussed benefits of CIR program and he says he is aware from past admission.    He does not believe his wife can care for him and his son agrees.  Pt./ family are requesting [facility].  I have updated Ricki Miller RNCM who notified Lubertha Sayres CSW.  I will sign off.  Please call if questions.  [place] Admissions Coordinator Cell [phone] Office [phone]

## 2016-01-01 NOTE — Progress Notes (Signed)
Inpatient Diabetes Program Recommendations  AACE/ADA: New Consensus Statement on Inpatient Glycemic Control (2015)  Target Ranges:  Prepandial:   less than 140 mg/dL      Peak postprandial:   less than 180 mg/dL (1-2 hours)      Critically ill patients:  140 - 180 mg/dL   Review of Glycemic ControlResults for [name] [name], [name] [name] (MRN [id number]) as of 01/01/2016 10:04  Ref. Range 12/31/2015 06:56 12/31/2015 12:15 12/31/2015 16:13 12/31/2015 21:46 01/01/2016 06:57  Glucose-Capillary Latest Ref Range: 65-99 mg/dL 201 (H) 275 (H) 234 (H) 218 (H) 272 (H)   Diabetes history: Diabetes mellitus Outpatient Diabetes medications: Levemir 30 units q AM and 40 units q PM Current orders for Inpatient glycemic control:  Novolog sensitive tid with meals and HS, Levemir 20 units q AM and 30 units q PM  Inpatient Diabetes Program Recommendations:  Please consider increasing Levemir to 25 units q AM and 35 units q PM.  Also may consider adding Novolog meal coverage 4 units tid with meals (Hold if patient eats less than 50%).  Text page sent to Dr. Eliseo Squires.   Thanks, Adah Perl, RN, BC-ADM Inpatient Diabetes Coordinator Pager [phone] (8a-5p)

## 2016-01-01 NOTE — Clinical Social Work Note (Signed)
Patient has been offered a bed at patient's family first choice SNF: [facility]. Patient's wife updated and patient's wife working with SNF to complete admission paperwork.  Per [facility], patient's wife owes money to SNF but patient's wife aware and able to pay money owed.  Lubertha Sayres, [facility]: [phone] Surgical: [phone]

## 2016-01-01 NOTE — Progress Notes (Signed)
ANTICOAGULATION CONSULT NOTE  Pharmacy Consult:  Coumadin Indication:  Afib  No Known Allergies  Patient Measurements: Height: 5\' 9"  (175.3 cm) Weight: 202 lb 2.6 oz (91.7 kg) IBW/kg (Calculated) : 70.7  Vital Signs: Temp: 98.3 F (36.8 C) (05/09 0612) BP: 120/53 mmHg (05/09 0612) Pulse Rate: 93 (05/09 0612)  Labs:  Recent Labs  12/30/15 0344 12/30/15 0452 12/31/15 0315 01/01/16 0537  HGB 8.8*  --  8.6* 8.8*  HCT 27.9*  --  26.6* 27.0*  PLT 144*  --  132* 168  LABPROT  --  16.4* 16.4* 19.6*  INR  --  1.31 1.31 1.66*  CREATININE  --  2.63* 2.61* 1.49*    Estimated Creatinine Clearance: 50.1 mL/min (by C-G formula based on Cr of 1.49).   Assessment: 70 YOM on Coumadin PTA for history of Afib. Patient's Coumadin was reversed for open treatment of femoral fracture today 12/28/15. Pharmacy consulted to resume Coumadin 5/5.   INR is sub-therapeutic  On lovenox 40 daily until INR therapeutic.   INR cont to be low after 3 doses of coumadin 15mg  due to vit K.   Home regimen: Coumadin 10mg  daily except 15mg  on Sat, last dose 12/22/15 pta  Goal of Therapy:  INR 2-3  Plan:  - Coumadin 20mg  x 1 dose tonight - Lovenox 40mg  daily until INR therapeutic - Daily INR - Monitor s/sx bleeding  Thank you Anette Guarneri, PharmD [phone] 01/01/2016 9:05 AM

## 2016-01-01 NOTE — Progress Notes (Signed)
Physical Therapy Treatment Patient Details Name: [name] [name] MRN: [id number] DOB: [birth year] Today's Date: 01/01/2016    History of Present Illness Pt presents for left THA due to femoral neck fx sustained when fell in garden while ambulating with walker. PMH: bilateral BKA, dementia, LBBB, CHF, CAD, DM    PT Comments    Pt progressing slowly towards physical therapy goals. Noted CIR recommending SNF based on conversation with pt and son. Agree this is the most appropriate disposition based on last 2 sessions. Frequency and follow-up recommendations updated to reflect this change. Will continue to follow and progress as able per POC.   Follow Up Recommendations  SNF;Supervision/Assistance - 24 hour     Equipment Recommendations  None recommended by PT    Recommendations for Other Services       Precautions / Restrictions Precautions Precautions: Posterior Hip;Fall Precaution Booklet Issued: Yes (comment) Precaution Comments: Needs review for post hip precautions. Bilateral prostheses present in room  Restrictions Weight Bearing Restrictions: Yes LLE Weight Bearing: Weight bearing as tolerated    Mobility  Bed Mobility               General bed mobility comments: Pt sitting up in recliner upon PT arrival.   Transfers Overall transfer level: Needs assistance Equipment used: Rolling walker (2 wheeled) Transfers: Sit to/from Stand Sit to Stand: Total assist;+2 physical assistance         General transfer comment: Attempted sit<>stand from recliner x3 and pt was unable to achieve full standing position even with +2 total assistance. On last attempt pt tried pulling to stand while facing a raised bed rail but was again unable to achieve.   Ambulation/Gait                 Stairs            Wheelchair Mobility    Modified Rankin (Stroke Patients Only)       Balance Overall balance assessment: Needs assistance Sitting-balance support: Feet  supported;Bilateral upper extremity supported Sitting balance-Leahy Scale: Poor Sitting balance - Comments: Pt only able to hold upright posture away from backrest of chair for a short time.                             Cognition Arousal/Alertness: Lethargic Behavior During Therapy: WFL for tasks assessed/performed Overall Cognitive Status: History of cognitive impairments - at baseline       Memory: Decreased short-term memory;Decreased recall of precautions              Exercises Total Joint Exercises Quad Sets: 10 reps Gluteal Sets: 10 reps Hip ABduction/ADduction: 10 reps Long Arc Quad: 10 reps    General Comments        Pertinent Vitals/Pain Pain Assessment: Faces Faces Pain Scale: Hurts even more Pain Location: L hip Pain Descriptors / Indicators: Operative site guarding;Sore Pain Intervention(s): Limited activity within patient's tolerance;Monitored during session;Repositioned    Home Living                      Prior Function            PT Goals (current goals can now be found in the care plan section) Acute Rehab PT Goals Patient Stated Goal: return home PT Goal Formulation: With patient Time For Goal Achievement: 01/05/16 Potential to Achieve Goals: Good Progress towards PT goals: Not progressing toward goals - comment    Frequency  Min 3X/week    PT Plan Discharge plan needs to be updated;Frequency needs to be updated    Co-evaluation             End of Session Equipment Utilized During Treatment: Gait belt;Other (comment) (Bilateral prostheses) Activity Tolerance: Patient limited by fatigue (weakness) Patient left: in chair;with call bell/phone within reach;with chair alarm set     Time: [id number] PT Time Calculation (min) (ACUTE ONLY): 29 min  Charges:  $Gait Training: 23-37 mins                    G Codes:      Rolinda Roan 2016/01/07, 2:47 PM  Rolinda Roan, PT, DPT Acute Rehabilitation  Services Pager: [phone]

## 2016-01-02 LAB — BASIC METABOLIC PANEL
ANION GAP: 10 (ref 5–15)
BUN: 30 mg/dL — ABNORMAL HIGH (ref 6–20)
CHLORIDE: 107 mmol/L (ref 101–111)
CO2: 23 mmol/L (ref 22–32)
Calcium: 7.7 mg/dL — ABNORMAL LOW (ref 8.9–10.3)
Creatinine, Ser: 1.32 mg/dL — ABNORMAL HIGH (ref 0.61–1.24)
GFR calc non Af Amer: 52 mL/min — ABNORMAL LOW (ref >60)
Glucose, Bld: 222 mg/dL — ABNORMAL HIGH (ref 65–99)
POTASSIUM: 4.2 mmol/L (ref 3.5–5.1)
SODIUM: 140 mmol/L (ref 135–145)

## 2016-01-02 LAB — CBC
HCT: 25.8 % — ABNORMAL LOW (ref 39.0–52.0)
HEMOGLOBIN: 8.1 g/dL — AB (ref 13.0–17.0)
MCH: 23.6 pg — ABNORMAL LOW (ref 26.0–34.0)
MCHC: 31.4 g/dL (ref 30.0–36.0)
MCV: 75.2 fL — ABNORMAL LOW (ref 78.0–100.0)
Platelets: 191 10*3/uL (ref 150–400)
RBC: 3.43 MIL/uL — AB (ref 4.22–5.81)
RDW: 16.6 % — ABNORMAL HIGH (ref 11.5–15.5)
WBC: 5.6 10*3/uL (ref 4.0–10.5)

## 2016-01-02 LAB — PROTIME-INR
INR: 1.55 — ABNORMAL HIGH (ref 0.00–1.49)
Prothrombin Time: 18.6 seconds — ABNORMAL HIGH (ref 11.6–15.2)

## 2016-01-02 LAB — GLUCOSE, CAPILLARY: GLUCOSE-CAPILLARY: 192 mg/dL — AB (ref 65–99)

## 2016-01-02 MED ORDER — FUROSEMIDE 40 MG PO TABS: 40.0000 mg | ORAL_TABLET | Freq: Every day | ORAL | Status: DC

## 2016-01-02 MED ORDER — WARFARIN SODIUM 7.5 MG PO TABS: 20.0000 mg | ORAL_TABLET | Freq: Once | ORAL | Status: DC

## 2016-01-02 MED ORDER — INSULIN DETEMIR 100 UNIT/ML ~~LOC~~ SOLN: SUBCUTANEOUS | Status: DC

## 2016-01-02 MED ORDER — ENOXAPARIN SODIUM 40 MG/0.4ML ~~LOC~~ SOLN: 40.0000 mg | SUBCUTANEOUS | Status: DC

## 2016-01-02 MED ORDER — POTASSIUM CHLORIDE CRYS ER 20 MEQ PO TBCR: 20.0000 meq | EXTENDED_RELEASE_TABLET | Freq: Every day | ORAL | Status: DC

## 2016-01-02 MED ORDER — TAMSULOSIN HCL 0.4 MG PO CAPS: 0.4000 mg | ORAL_CAPSULE | Freq: Every day | ORAL | Status: DC

## 2016-01-02 MED ORDER — OXYCODONE HCL 5 MG PO TABS: 5.0000 mg | ORAL_TABLET | ORAL | Status: DC | PRN

## 2016-01-02 MED ORDER — OXYCODONE-ACETAMINOPHEN 5-325 MG PO TABS: 2.0000 | ORAL_TABLET | ORAL | Status: DC | PRN

## 2016-01-02 NOTE — Care Management Important Message (Signed)
Important Message  Patient Details  Name: [name] [name] MRN: [id number] Date of Birth: 1946/05/28   Medicare Important Message Given:  Yes    [name] [name] 01/02/2016, 1:14 PM

## 2016-01-02 NOTE — Progress Notes (Signed)
Called [facility] to give report on Mr [name], [name] to [facility], Pt all set to go IV discontinued just waiting on P-Tar Audie Box

## 2016-01-02 NOTE — Clinical Social Work Note (Signed)
Patient will discharge today per MD order. Patient will discharge to: [facility] RN to call report prior to transportation to: [phone] Transportation: [facility] scheduled  CSW sent discharge summary to SNF for review.  CSW contacted patient's wife, [name] [name], to review discharge plans who is agreeable to this plan.  CSW updated RN who was agreeable for transportation to be called.  Nonnie Done, LCSW [phone]  5N1-9, 2S 15-16 and Psychiatric Service Line  Licensed Clinical Social Worker

## 2016-01-02 NOTE — Discharge Summary (Addendum)
Physician Discharge Summary  [name] [name] [id number] DOB: [birth year] DOA: 12/27/2015  PCP: [name], MD  Admit date: 12/27/2015 Discharge date: 01/02/2016  Recommendations for Outpatient Follow-up:  1. Pt will need to follow up with PCP in 1-2 weeks post discharge 2. Please obtain BMP to evaluate electrolytes and kidney function 3. Please also check CBC to evaluate Hg and Hct levels 4. Please note that Lasix frequency changed from 40 mg BID to 40 mg QD until BP stabilizes 5. Also, please note that Spironolactone was stopped temporarily until BP stabilizes, it can be resumed provided BP is stable  6. Pt to continue taking Coumadin per home regimen 10 mg daily every day except Saturdays when he takes15 mg PO QD 7. Pt also needs to be on Lovenox 40 mg SQ until INR therapeutic 2-3 8. Please check INR in 1-2 days and adjust the dose of Coumadin if needed  9. Please attempt voiding trial in 1 week  10. Please note that Flomax was added due to urinary retention and could be discontinued if no longer needed after voiding trial done at the SNF  Discharge Diagnoses:  Principal Problem:   Closed left hip fracture ([place]) Active Problems:   SYSTOLIC HEART FAILURE, CHRONIC  Discharge Condition: Stable  Diet recommendation: Heart healthy diet discussed in details   History of present illness:   Brief Narrative:  70 y.o. 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 Came from home. S/p partial hip replacement on 5/5. Course complicated by AKI due to urinary retention and dehydration.Plan to discharge to SNF for continuation of physical rehabilitation.   Assessment & Plan:  Closed left hip fracture [facility]):  - S/p partial hip replacement 5/5 - Pt doing  well, plan to discharge to SNF, stable for d/c  Vit D def - continue Vit D supplementation   Acute kidney injury with acute urinary retention - replaced foley - plan on voiding trial at SNF in 1 week  - continue Flomax until voiding trial done - lasix resumed at 40 mg QD rather than home regimen of BID due to softer BP - once BP more stable, can resume home regimen - spironolactone on hold as well until BO stabilizes   Chronic combined systolic and diastolic congestive heart failure - 2D echo on 02/15/13 showed EF 20-25 percent with grade 1 diastolic dysfunction. 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 Baseline creatinine 1.3-1.4.   Anemia - expected, post op related - no signs of active bleeding - CBC In AM   Code Status Full Code   Consultants:   OrthoErlinda Hong  Procedures:   Partial hip replacement 5/6  Studies: Dg Chest 1 View  12/27/2015  CLINICAL DATA:  Left hip pain after a fall. Preop for hip  fracture. Defibrillator. Slight congestion. Diabetes. Hypertension. EXAM: CHEST 1 VIEW COMPARISON:  01/30/2014 FINDINGS: Cardiac pacemaker. Normal heart size and pulmonary vascularity. No focal airspace disease or consolidation. Slight fibrosis or linear atelectasis in the right lung base. No blunting of costophrenic angles. No pneumothorax. Mediastinal contours appear intact. IMPRESSION: No active disease. Electronically Signed   By: Lucienne Capers M.D.   On: 12/27/2015 23:04   Dg  Pelvis 1-2 Views  12/27/2015  CLINICAL DATA:  Left hip pain after a fall. EXAM: LEFT FEMUR 2 VIEWS; PELVIS - 1-2 VIEW COMPARISON:  None. FINDINGS: There is an acute transverse fracture of the left femoral shaft with superior displacement of the distal fracture fragment resulting in varus angulation of the hip. No evidence of dislocation of the hip. Midshaft and distal left femur appear intact. No additional fractures are demonstrated. Pelvis appears intact. No acute fracture or dislocation demonstrated in the pelvis. SI joints and symphysis pubis are not displaced. Visualized right hip appears intact. Diffuse vascular calcifications. IMPRESSION: Acute transverse fracture of the left femoral neck with varus angulation. No pelvic fractures identified. Electronically Signed   By: Lucienne Capers M.D.   On: 12/27/2015 22:56   Ct Head Wo Contrast  12/27/2015  CLINICAL DATA:  [place] while gardening.  Anticoagulated. EXAM: CT HEAD WITHOUT CONTRAST TECHNIQUE: Contiguous axial images were obtained from the base of the skull through the vertex without intravenous contrast. COMPARISON:  03/02/2013 FINDINGS: There is no intracranial hemorrhage or extra-axial fluid collection. There is moderate generalized atrophy. There is white matter hypodensity which is chronic and likely due to small vessel disease. There is more focally prominent patchy hypodensity in the subcortical white matter of the right frontoparietal convexity, unchanged from 03/02/2013 but possibly representing an area of remote white matter infarction. No acute findings are evident. Calvarium and skullbase are intact. Orbits are unremarkable. IMPRESSION: No acute findings. There is moderate generalized atrophy and chronic white matter hypodensity, likely small vessel disease. Electronically Signed   By: Andreas Newport M.D.   On: 12/27/2015 23:11   Pelvis Portable  12/28/2015  CLINICAL DATA:  Left hip arthroplasty. EXAM: PORTABLE PELVIS 1-2 VIEWS COMPARISON:   12/27/2015 FINDINGS: Left hip arthroplasty changes identified without complicating features. Postoperative changes in the soft tissues noted. IMPRESSION: Left hip arthroplasty changes without complicating features. Electronically Signed   By: Margarette Canada M.D.   On: 12/28/2015 20:54   Dg Femur Min 2 Views Left  12/27/2015  CLINICAL DATA:  Left hip pain after a fall. EXAM: LEFT FEMUR 2 VIEWS; PELVIS - 1-2 VIEW COMPARISON:  None. FINDINGS: There is an acute transverse fracture of the left femoral shaft with superior displacement of the distal fracture fragment resulting in varus angulation of the hip. No evidence of dislocation of the hip. Midshaft and distal left femur appear intact. No additional fractures are demonstrated. Pelvis appears intact. No acute fracture or dislocation demonstrated in the pelvis. SI joints and symphysis pubis are not displaced. Visualized right hip appears intact. Diffuse vascular calcifications. IMPRESSION: Acute transverse fracture of the left femoral neck with varus angulation. No pelvic fractures identified. Electronically Signed   By: Lucienne Capers M.D.   On: 12/27/2015 22:56   Discharge Exam: Filed Vitals:   01/01/16 2100 01/02/16 0606  BP: 104/57 114/49  Pulse: 81 82  Temp: 99.3 F (37.4 C) 98.8 F (37.1 C)  Resp: 16 15   Filed Vitals:   01/01/16 0612 01/01/16 1300 01/01/16 2100 01/02/16 0606  BP: 120/53 102/52 104/57 114/49  Pulse: 93 81 81 82  Temp: 98.3 F (36.8 C) 98.7 F (37.1 C) 99.3 F (37.4 C) 98.8 F (37.1 C)  TempSrc:  Oral Oral Oral  Resp: 18 18 16 15   Height:      Weight:    91.1 kg (200 lb 13.4 oz)  SpO2: 97% 96% 96% 98%    General: Pt is alert, follows commands appropriately, not in acute distress Cardiovascular: Regular rate and rhythm, no rubs, no gallops Respiratory: Clear to auscultation bilaterally, no wheezing, no crackles, no rhonchi Abdominal: Soft, non tender, non distended, bowel sounds +, no guarding   Discharge  Instructions  Discharge Instructions    Diet - low sodium heart healthy    Complete by:  As directed      Increase activity slowly    Complete by:  As directed      Weight bearing as tolerated    Complete by:  As directed             Medication List    STOP taking these medications        spironolactone 25 MG tablet  Commonly known as:  ALDACTONE     sulfamethoxazole-trimethoprim 800-160 MG tablet  Commonly known as:  BACTRIM DS,SEPTRA DS      TAKE these medications        aspirin 81 MG EC tablet  Take 1 tablet (81 mg total) by mouth daily.     atorvastatin 10 MG tablet  Commonly known as:  LIPITOR  Take 1 tablet (10 mg total) by mouth daily.     carvedilol 12.5 MG tablet  Commonly known as:  COREG  Take 1 tablet (12.5 mg total) by mouth 2 (two) times daily with a meal.     cholecalciferol 1000 units tablet  Commonly known as:  VITAMIN D  Take 1 tablet (1,000 Units total) by mouth daily.     clopidogrel 75 MG tablet  Commonly known as:  PLAVIX  Take 1 tablet (75 mg total) by mouth daily with breakfast.     enoxaparin 40 MG/0.4ML injection  Commonly known as:  LOVENOX  Inject 0.4 mLs (40 mg total) into the skin daily. Continue taking until INR therapeutic     esomeprazole 40 MG capsule  Commonly known as:  NEXIUM  Take 1 capsule (40 mg total) by mouth daily as needed (acid reflux).     furosemide 40 MG tablet  Commonly known as:  LASIX  Take 1 tablet (40 mg total) by mouth daily.     insulin detemir 100 UNIT/ML injection  Commonly known as:  LEVEMIR  25 unit AM and 35 unit PM     iron polysaccharides 150 MG capsule  Commonly known as:  NIFEREX  Take 1 capsule (150 mg total) by mouth daily.     multivitamin with minerals Tabs tablet  Take 1 tablet by mouth daily.     oxyCODONE 5 MG immediate release tablet  Commonly known as:  Oxy IR/ROXICODONE  Take 1-2 tablets (5-10 mg total) by mouth every 4 (four) hours as needed for breakthrough pain ((for  MODERATE breakthrough pain)).     oxyCODONE-acetaminophen 5-325 MG tablet  Commonly known as:  PERCOCET/ROXICET  Take 2 tablets by mouth every 4 (four) hours as needed for severe pain.     potassium chloride SA 20 MEQ tablet  Commonly known as:  K-DUR,KLOR-CON  Take 1 tablet (20 mEq total) by mouth daily.     pregabalin 75 MG capsule  Commonly known as:  LYRICA  Take 1 capsule (75 mg total) by mouth 2 (two) times daily.     sacubitril-valsartan 24-26 MG  Commonly known as:  ENTRESTO  Take 1 tablet by mouth 2 (two) times daily.     senna-docusate 8.6-50 MG tablet  Commonly known as:  Senokot-S  Take 1 tablet by mouth 2 (two) times daily.     tamsulosin 0.4 MG Caps capsule  Commonly known as:  FLOMAX  Take 1 capsule (0.4 mg total) by mouth daily after supper.     vitamin E 1000 UNIT capsule  Generic drug:  vitamin E  Take 1,000 Units by mouth daily.     warfarin 5 MG tablet  Commonly known as:  COUMADIN  TAKE AS DIRECTED BY COUMADIN CLINIC     warfarin 10 MG tablet  Commonly known as:  COUMADIN  TAKE AS DIRECTED BY COUMADIN CLINIC           Follow-up Information    Follow up with Marianna Payment, MD In 2 weeks.   Specialty:  Orthopedic Surgery   Why:  For suture removal, For wound re-check   Contact information:   [place] [address] [phone]       Follow up with [name], MD.   Specialty:  Internal Medicine   Contact information:   [address] [address] [place] [address] [phone]       Call Faye Ramsay, MD.   Specialty:  Internal Medicine   Why:  As needed call my cell [phone]   Contact information:   [address] [place] [address] [phone]        The results of significant diagnostics from this hospitalization (including imaging, microbiology, ancillary and laboratory) are listed below for reference.     Microbiology: Recent Results (from the  past 240 hour(s))  Surgical pcr screen     Status: Abnormal   Collection Time: 12/28/15  1:45 AM  Result Value Ref Range Status   MRSA, PCR NEGATIVE NEGATIVE Final   Staphylococcus aureus POSITIVE (A) NEGATIVE Final     Labs: Basic Metabolic Panel:  Recent Labs Lab 12/29/15 0551 12/30/15 0452 12/31/15 0315 01/01/16 0537 01/02/16 0351  NA 143 140 138 139 140  K 4.0 3.9 3.9 4.2 4.2  CL 105 105 103 104 107  CO2 25 24 23 23 23   GLUCOSE 195* 128* 247* 242* 222*  BUN 25* 35* 49* 38* 30*  CREATININE 1.61* 2.63* 2.61* 1.49* 1.32*  CALCIUM 8.2* 8.3* 7.9* 7.8* 7.7*   CBC:  Recent Labs Lab 12/29/15 0551 12/30/15 0344 12/31/15 0315 01/01/16 0537 01/02/16 0351  WBC 6.5 6.9 6.4 6.2 5.6  HGB 9.9* 8.8* 8.6* 8.8* 8.1*  HCT 32.2* 27.9* 26.6* 27.0* 25.8*  MCV 77.8* 77.5* 76.4* 77.4* 75.2*  PLT 163 144* 132* 168 191    BNP (last 3 results)  Recent Labs  12/28/15 0550  BNP 380.3*   CBG:  Recent Labs Lab 01/01/16 0657 01/01/16 1154 01/01/16 1632 01/01/16 2100 01/02/16 0609  GLUCAP 272* 192* 196* 201* 192*    SIGNED: Time coordinating discharge: 30 minutes  MAGICK-Saydee Zolman, MD  [facility] 01/02/2016, 10:27 AM Pager [phone]  If 7PM-7AM, please contact night-coverage [email or web address] Password [email or web address]

## 2016-01-02 NOTE — Progress Notes (Signed)
ANTICOAGULATION CONSULT NOTE  Pharmacy Consult:  Coumadin Indication:  Afib  No Known Allergies  Patient Measurements: Height: 5\' 9"  (175.3 cm) Weight: 200 lb 13.4 oz (91.1 kg) IBW/kg (Calculated) : 70.7  Vital Signs: Temp: 98.8 F (37.1 C) (05/10 0606) Temp Source: Oral (05/10 0606) BP: 114/49 mmHg (05/10 0606) Pulse Rate: 82 (05/10 0606)  Labs:  Recent Labs  12/31/15 0315 01/01/16 0537 01/02/16 0351  HGB 8.6* 8.8* 8.1*  HCT 26.6* 27.0* 25.8*  PLT 132* 168 191  LABPROT 16.4* 19.6* 18.6*  INR 1.31 1.66* 1.55*  CREATININE 2.61* 1.49* 1.32*    Estimated Creatinine Clearance: 56.5 mL/min (by C-G formula based on Cr of 1.32).   Assessment: 70 YOM on Coumadin PTA for history of Afib. Patient's Coumadin was reversed for open treatment of femoral fracture today 12/28/15. Pharmacy consulted to resume Coumadin 5/5.   INR is sub-therapeutic  On lovenox 40 daily until INR therapeutic.   INR cont to be low after high dose of Coumadin due to Vitamin K 10 mg x 2 given  Home regimen: Coumadin 10mg  daily except 15mg  on Sat, last dose 12/22/15 pta  Goal of Therapy:  INR 2-3  Plan:  - Coumadin 20mg  x 1 dose tonight - Lovenox 40mg  daily until INR therapeutic - Daily INR - Monitor s/sx bleeding  Thank you Anette Guarneri, PharmD [phone] 01/02/2016 9:08 AM

## 2016-01-02 NOTE — Clinical Social Work Placement (Signed)
   CLINICAL SOCIAL WORK PLACEMENT  NOTE  Date:  01/02/2016  Patient Details  Name: [name] [name] MRN: [id number] Date of Birth: 13-Sep-1945  Clinical Social Work is seeking post-discharge placement for this patient at the [place] level of care (*CSW will initial, date and re-position this form in  chart as items are completed):  Yes   Patient/family provided with [facility] Work Department's list of facilities offering this level of care within the geographic area requested by the patient (or if unable, by the patient's family).  Yes   Patient/family informed of their freedom to choose among providers that offer the needed level of care, that participate in Medicare, Medicaid or managed care program needed by the patient, have an available bed and are willing to accept the patient.  Yes   Patient/family informed of [facility]'s ownership interest in [facility] and [facility], as well as of the fact that they are under no obligation to receive care at these facilities.  PASRR submitted to EDS on       PASRR number received on       Existing PASRR number confirmed on 12/31/15     FL2 transmitted to all facilities in geographic area requested by pt/family on 12/31/15     FL2 transmitted to all facilities within larger geographic area on       Patient informed that his/her managed care company has contracts with or will negotiate with certain facilities, including the following:            Patient/family informed of bed offers received.  Patient chooses bed at [place], [place]     Physician recommends and patient chooses bed at      Patient to be transferred to [facility] on 01/02/16.  Patient to be transferred to facility by [facility]     Patient family notified on 01/02/16 of transfer.  Name of family member notified:  wife     PHYSICIAN Please sign FL2     Additional Comment:     _______________________________________________ Dulcy Fanny, LCSW 01/02/2016, 10:50 AM

## 2016-01-04 ENCOUNTER — Encounter (HOSPITAL_COMMUNITY): Payer: Self-pay

## 2016-01-04 ENCOUNTER — Emergency Department (HOSPITAL_COMMUNITY)
Admission: EM | Admit: 2016-01-04 | Discharge: 2016-01-04 | Disposition: A | Discharge status: Home / Self Care | Payer: Medicare Other | Attending: Emergency Medicine | Admitting: Emergency Medicine

## 2016-01-04 DIAGNOSIS — I480 Paroxysmal atrial fibrillation: Secondary | ICD-10-CM | POA: Insufficient documentation

## 2016-01-04 DIAGNOSIS — I2510 Atherosclerotic heart disease of native coronary artery without angina pectoris: Secondary | ICD-10-CM | POA: Diagnosis not present

## 2016-01-04 DIAGNOSIS — Z7982 Long term (current) use of aspirin: Secondary | ICD-10-CM | POA: Diagnosis not present

## 2016-01-04 DIAGNOSIS — Z95810 Presence of automatic (implantable) cardiac defibrillator: Secondary | ICD-10-CM | POA: Insufficient documentation

## 2016-01-04 DIAGNOSIS — Z7902 Long term (current) use of antithrombotics/antiplatelets: Secondary | ICD-10-CM | POA: Diagnosis not present

## 2016-01-04 DIAGNOSIS — E7800 Pure hypercholesterolemia, unspecified: Secondary | ICD-10-CM | POA: Insufficient documentation

## 2016-01-04 DIAGNOSIS — G629 Polyneuropathy, unspecified: Secondary | ICD-10-CM | POA: Diagnosis not present

## 2016-01-04 DIAGNOSIS — Z87442 Personal history of urinary calculi: Secondary | ICD-10-CM | POA: Diagnosis not present

## 2016-01-04 DIAGNOSIS — M1990 Unspecified osteoarthritis, unspecified site: Secondary | ICD-10-CM | POA: Diagnosis not present

## 2016-01-04 DIAGNOSIS — Z7901 Long term (current) use of anticoagulants: Secondary | ICD-10-CM | POA: Diagnosis not present

## 2016-01-04 DIAGNOSIS — F0390 Unspecified dementia without behavioral disturbance: Secondary | ICD-10-CM | POA: Diagnosis not present

## 2016-01-04 DIAGNOSIS — N183 Chronic kidney disease, stage 3 (moderate): Secondary | ICD-10-CM | POA: Insufficient documentation

## 2016-01-04 DIAGNOSIS — I129 Hypertensive chronic kidney disease with stage 1 through stage 4 chronic kidney disease, or unspecified chronic kidney disease: Secondary | ICD-10-CM | POA: Diagnosis not present

## 2016-01-04 DIAGNOSIS — I5022 Chronic systolic (congestive) heart failure: Secondary | ICD-10-CM | POA: Diagnosis not present

## 2016-01-04 DIAGNOSIS — K219 Gastro-esophageal reflux disease without esophagitis: Secondary | ICD-10-CM | POA: Insufficient documentation

## 2016-01-04 DIAGNOSIS — K5900 Constipation, unspecified: Secondary | ICD-10-CM | POA: Diagnosis not present

## 2016-01-04 DIAGNOSIS — E1122 Type 2 diabetes mellitus with diabetic chronic kidney disease: Secondary | ICD-10-CM | POA: Insufficient documentation

## 2016-01-04 DIAGNOSIS — D649 Anemia, unspecified: Secondary | ICD-10-CM | POA: Insufficient documentation

## 2016-01-04 DIAGNOSIS — R300 Dysuria: Secondary | ICD-10-CM | POA: Diagnosis present

## 2016-01-04 DIAGNOSIS — N472 Paraphimosis: Secondary | ICD-10-CM | POA: Insufficient documentation

## 2016-01-04 DIAGNOSIS — Z794 Long term (current) use of insulin: Secondary | ICD-10-CM | POA: Insufficient documentation

## 2016-01-04 DIAGNOSIS — Z79899 Other long term (current) drug therapy: Secondary | ICD-10-CM | POA: Insufficient documentation

## 2016-01-04 DIAGNOSIS — Z8701 Personal history of pneumonia (recurrent): Secondary | ICD-10-CM | POA: Insufficient documentation

## 2016-01-04 MED ORDER — LIDOCAINE-PRILOCAINE 2.5-2.5 % EX CREA
TOPICAL_CREAM | Freq: Once | CUTANEOUS | Status: AC
  Administered 2016-01-04: 20:00:00 via TOPICAL
  Filled 2016-01-04: qty 5

## 2016-01-04 MED ORDER — MORPHINE SULFATE (PF) 4 MG/ML IV SOLN
4.0000 mg | Freq: Once | INTRAVENOUS | Status: AC
  Administered 2016-01-04: 4 mg via INTRAMUSCULAR
  Filled 2016-01-04: qty 1

## 2016-01-04 MED ORDER — NYSTATIN 100000 UNIT/GM EX CREA: TOPICAL_CREAM | CUTANEOUS | Status: DC

## 2016-01-04 NOTE — Discharge Instructions (Signed)
Apply the nystatin cream to penis 2 times a day.  If you are unable to urinate, have the nursing facility put in another Foley catheter.

## 2016-01-04 NOTE — ED Notes (Signed)
[facility]- pt coming from [facility]. Pt had hip replacement last week and d/c on Sunday. Pt d/c with foley catheter in place. Pt here for pain with urination and pain in foreskin. Normal urinary output noted. Vital signs stable.

## 2016-01-04 NOTE — ED Notes (Signed)
Pt reports he does not want catheter put back in place. O'[place]ourke MD will DC with order to place catheter if pt is unable to void

## 2016-01-04 NOTE — ED Provider Notes (Signed)
CSN: [id number]     Arrival date & time 01/04/16  1855 History   First MD Initiated Contact with Patient 01/04/16 1857     Chief Complaint  Patient presents with  . Dysuria     (Consider location/radiation/quality/duration/timing/severity/associated sxs/prior Treatment) Patient is a 70 y.o. male presenting with general illness. The history is provided by the patient, the spouse and medical records.  Illness Location:  Penile pain Severity:  Moderate Onset quality:  Gradual Duration:  1 week Timing:  Constant Progression:  Worsening Chronicity:  New Context:  Recent admission for hip fx. Foley left in place. At SNF, noticed progressive worsening of swelling of end of his penis, painful. Sent here. No f/c.  Associated symptoms: rash   Associated symptoms: no abdominal pain, no chest pain, no cough, no diarrhea, no fever, no headaches, no nausea, no shortness of breath and no vomiting     Past Medical History  Diagnosis Date  . Cardiomyopathy, nonischemic ([place])     a. Cath 2003: mild nonobstructive CAD, EF 25% at that time.  [name] PAF (paroxysmal atrial fibrillation) ([place])     a. Noted on ICD interrogation 2012;  b. coumadin d/c'd 01/2013.  [name] NSVT (nonsustained ventricular tachycardia) ([place])     a. Noted on ICD interrogation in 2011.  . High cholesterol     takes Atorvastatin daily  . PAD (peripheral artery disease) ([place])     a. 08/2013 Periph Angio/PTA: Abd Ao nl, RLE- 3v runoff, PT diff dzs, AT 90p, LLE 2v runoff, PT 100, AT 80m (diamondback ORA/chocolate balloon PTA).  . Automatic implantable cardioverter-defibrillator in situ     a. 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  . Dementia   . Arthritis   . History of blood transfusion     no abnormal reaction noted  . Complication of anesthesia   . PONV (postoperative nausea and vomiting)   . Hypertension     takes Coreg daily  . LBBB (left bundle branch block)     takes Coumadin daily  . Anemia    takes Iron pill daily  . Constipation     takes Sennokot daily  . GERD (gastroesophageal reflux disease)     takes Nexium daily  . Chronic systolic CHF (congestive heart failure) (H[facility])     takes Furosemide daily as well as Aldactone  . Pneumonia     hx of > 57yr ago  . History of bronchitis     > 1 yr ago  . Headache     occasionally  . Peripheral neuropathy ([facility])     hands;numbness and tingling   . Urinary frequency   . Urinary urgency   . History of kidney stones   . Type II diabetes mellitus ([place])     takes Levemir daily as well as Novolog  . CAD (coronary artery disease)   . PAF (paroxysmal atrial fibrillation) ([place])   . CKD (chronic kidney disease), stage III    Past Surgical History  Procedure Laterality Date  . Lithotripsy  2001  . Cervical spine surgery  1994  . Cardiac defibrillator placement  06/2009    WITH GENERATOR REPLACED; BiV ICD  . US echocardiography  03/21/2008    EF 30-35%  . Cardiovascular stress test  03/20/2009    EF 33%  . Transluminal atherectomy tibial artery Left 09/12/2013  . Toe amputation  10/04/2013    LEFT GREAT TOE AND 4TH TOE   /   DR Erlinda Hong  .  Amputation Left 10/04/2013    Procedure: LEFT GREAT TOE AND SECOND TOE AMPUTATION;  Surgeon: Marianna Payment, MD;  Location: [place];  Service: Orthopedics;  Laterality: Left;  . Colonoscopy    . Leg amputation below knee Left 11/09/2013    DR Erlinda Hong  . Amputation Left 11/09/2013    Procedure: LEFT AMPUTATION BELOW KNEE;  Surgeon: Marianna Payment, MD;  Location: [place];  Service: Orthopedics;  Laterality: Left;  . Amputation Right 01/04/2014    Procedure: Doristine Devoid second and third toe amputation;  Surgeon: Marianna Payment, MD;  Location: [place];  Service: Orthopedics;  Laterality: Right;  . Amputation Right 01/30/2014    Procedure: RIGHT BELOW KNEE AMPUTATION;  Surgeon: Marianna Payment, MD;  Location: [place];  Service: Orthopedics;  Laterality: Right;  . Lower extremity angiogram Bilateral 09/12/2013     Procedure: LOWER EXTREMITY ANGIOGRAM;  Surgeon: Lorretta Harp, MD;  Location: [facility] CATH LAB;  Service: Cardiovascular;  Laterality: Bilateral;  . Abdominal angiogram  09/12/2013    Procedure: ABDOMINAL ANGIOGRAM;  Surgeon: Lorretta Harp, MD;  Location: [facility] CATH LAB;  Service: Cardiovascular;;  . Lower extremity angiogram N/A 12/29/2013    Procedure: LOWER EXTREMITY ANGIOGRAM;  Surgeon: Lorretta Harp, MD;  Location: [facility] CATH LAB;  Service: Cardiovascular;  Laterality: N/A;  . Atherectomy Right 12/29/2013    Procedure: ATHERECTOMY;  Surgeon: Lorretta Harp, MD;  Location: [facility] CATH LAB;  Service: Cardiovascular;  Laterality: Right;  Anterior Tibial Artery  . Cardiac catheterization  10/01/2001    THERE WAS GLOBAL HYPOKINESIS AND EF 25%. THERE APPEARED TO BE GLOBAL DECREASE IN WALL MOTION  . Esophagogastroduodenoscopy    . Stump revision Left 01/03/2015    Procedure: LEFT BELOW KNEE AMPUTATION REVISION;  Surgeon: Leandrew Koyanagi, MD;  Location: [place];  Service: Orthopedics;  Laterality: Left;  . Hip arthroplasty Left 12/28/2015    Procedure: POSTERIOR  APPROACH HEMI HIP ARTHROPLASTY;  Surgeon: Leandrew Koyanagi, MD;  Location: [place];  Service: Orthopedics;  Laterality: Left;   Family History  Problem Relation Age of Onset  . Heart disease Mother   . Hypertension Mother   . Diabetes Mother   . Diabetes Father    Social History  Substance Use Topics  . Smoking status: Never Smoker   . Smokeless tobacco: Never Used  . Alcohol Use: No    Review of Systems  Constitutional: Negative for fever and chills.  Respiratory: Negative for cough and shortness of breath.   Cardiovascular: Negative for chest pain.  Gastrointestinal: Negative for nausea, vomiting, abdominal pain and diarrhea.  Genitourinary: Positive for penile swelling and penile pain. Negative for scrotal swelling and testicular pain.       Foley in place  Musculoskeletal: Negative for back pain.  Skin: Positive for rash.  Neurological:  Negative for light-headedness and headaches.  All other systems reviewed and are negative.     Allergies  Review of patient's allergies indicates no known allergies.  Home Medications   Prior to Admission medications   Medication Sig Start Date End Date Taking? Authorizing Provider  aspirin 81 MG EC tablet Take 1 tablet (81 mg total) by mouth daily. 10/24/14   Deboraha Sprang, MD  atorvastatin (LIPITOR) 10 MG tablet Take 1 tablet (10 mg total) by mouth daily. 01/15/15   Lavon Paganini Angiulli, PA-C  carvedilol (COREG) 12.5 MG tablet Take 1 tablet (12.5 mg total) by mouth 2 (two) times daily with a meal. 01/15/15  Lavon Paganini Angiulli, PA-C  cholecalciferol (VITAMIN D) 1000 UNITS tablet Take 1 tablet (1,000 Units total) by mouth daily. 01/15/15   Lavon Paganini Angiulli, PA-C  clopidogrel (PLAVIX) 75 MG tablet Take 1 tablet (75 mg total) by mouth daily with breakfast. 01/15/15   Lavon Paganini Angiulli, PA-C  enoxaparin (LOVENOX) 40 MG/0.4ML injection Inject 0.4 mLs (40 mg total) into the skin daily. Continue taking until INR therapeutic 01/02/16   Theodis Blaze, MD  esomeprazole (NEXIUM) 40 MG capsule Take 1 capsule (40 mg total) by mouth daily as needed (acid reflux). 01/15/15   Lavon Paganini Angiulli, PA-C  furosemide (LASIX) 40 MG tablet Take 1 tablet (40 mg total) by mouth daily. 01/02/16   Theodis Blaze, MD  insulin detemir (LEVEMIR) 100 UNIT/ML injection 25 unit AM and 35 unit PM 01/02/16   Theodis Blaze, MD  iron polysaccharides (NIFEREX) 150 MG capsule Take 1 capsule (150 mg total) by mouth daily. 01/15/15   Lavon Paganini Angiulli, PA-C  Multiple Vitamin (MULTIVITAMIN WITH MINERALS) TABS tablet Take 1 tablet by mouth daily.    Historical Provider, MD  oxyCODONE (OXY IR/ROXICODONE) 5 MG immediate release tablet Take 1-2 tablets (5-10 mg total) by mouth every 4 (four) hours as needed for breakthrough pain ((for MODERATE breakthrough pain)). 01/02/16   Theodis Blaze, MD  oxyCODONE-acetaminophen (PERCOCET/ROXICET) 5-325 MG  tablet Take 2 tablets by mouth every 4 (four) hours as needed for severe pain. 01/02/16   Theodis Blaze, MD  potassium chloride SA (K-DUR,KLOR-CON) 20 MEQ tablet Take 1 tablet (20 mEq total) by mouth daily. 01/02/16   Theodis Blaze, MD  pregabalin (LYRICA) 75 MG capsule Take 1 capsule (75 mg total) by mouth 2 (two) times daily. 01/15/15   Daniel J Angiulli, PA-C  sacubitril-valsartan (ENTRESTO) 24-26 MG Take 1 tablet by mouth 2 (two) times daily. 10/31/15   Deboraha Sprang, MD  senna-docusate (SENOKOT-S) 8.6-50 MG per tablet Take 1 tablet by mouth 2 (two) times daily. 11/28/13   Bary Leriche, PA-C  tamsulosin (FLOMAX) 0.4 MG CAPS capsule Take 1 capsule (0.4 mg total) by mouth daily after supper. 01/02/16   Theodis Blaze, MD  vitamin E (VITAMIN E) 1000 UNIT capsule Take 1,000 Units by mouth daily.    Historical Provider, MD  warfarin (COUMADIN) 10 MG tablet TAKE AS DIRECTED BY COUMADIN CLINIC 11/15/15   Deboraha Sprang, MD  warfarin (COUMADIN) 5 MG tablet TAKE AS DIRECTED BY COUMADIN CLINIC 08/08/15   Deboraha Sprang, MD   BP 114/68 mmHg  Pulse 96  Temp(Src) 99.9 F (37.7 C) (Oral)  Resp 16  SpO2 96% Physical Exam  Constitutional: He is oriented to person, place, and time. He appears well-developed and well-nourished. No distress.  HENT:  Head: Normocephalic and atraumatic.  Eyes: EOM are normal. Pupils are equal, round, and reactive to light.  Cardiovascular: Normal rate, regular rhythm and intact distal pulses.   Pulmonary/Chest: Effort normal. No respiratory distress.  Abdominal: Soft. There is no tenderness. There is no rebound, no guarding, no CVA tenderness, no tenderness at McBurney's point and negative Murphy's sign.  Genitourinary: Testes normal. Uncircumcised. Paraphimosis, penile erythema and penile tenderness present.  No evidence of necrosis. Foley in place.  Musculoskeletal: He exhibits no edema.  Neurological: He is alert and oriented to person, place, and time.  Skin: Skin is warm  and dry.    ED Course  Procedures (including critical care time) PROCEDURE: PARAPHIMOSIS REDUCTION A time-out was completed prior  to the procedure. The patient was placed in the supine position. Penis had been dressed with a compressive wrap to decrease swelling for 10 minutes prior to procedure. EMLA cream was used anesthetize the surrounding skin area. The foreskin was able to be retracted around the glans penis. The attending was present for the entire procedure  Labs Review Labs Reviewed - No data to display  Imaging Review No results found. I have personally reviewed and evaluated these images and lab results as part of my medical decision-making.   EKG Interpretation None      MDM   Final diagnoses:  Paraphimosis    70 year old male presented with likely paraphimosis. Uncircumcised. Has had a Foley catheter in for almost a week. Has had progressive worsening is glans swelling over the last couple days and inability to reduce his foreskin over his glans. Painful. Denies any fevers and chills. No systemic symptoms. No other localizing infectious symptoms. On arrival patient is well-appearing in no acute distress. Vital signs normal and stable. He has marked swelling of his glans with erythema. No open lesions. No evidence of necrosis. Doubt Fournier's gangrene. Distal end of his foreskin is also swollen. Was able to apply EMLA cream and ice followed by a compression wrap was able to reduce his foreskin. Improvement in his pain after this.  Offered to replace his Foley catheter; however at this time is refusing this. He was unable to urinate while he was here but he understands that he might need a root replaced Foley catheter when he is at the skilled nursing facility, versus having to return to the emergency department for urinary retention. We'll give him the perception for nystatin cream for possible balanitis of his glans penis.  We'll have him follow-up with his primary care  doctor in next couple days for reevaluation. Strict return cautions provided. Patient discharged in stable condition.  Maryan Puls, MD 01/04/16 [place], DO 01/04/16 2354

## 2016-02-01 ENCOUNTER — Ambulatory Visit (INDEPENDENT_AMBULATORY_CARE_PROVIDER_SITE_OTHER): Payer: Medicare Other | Admitting: Internal Medicine

## 2016-02-01 DIAGNOSIS — I480 Paroxysmal atrial fibrillation: Secondary | ICD-10-CM

## 2016-02-01 DIAGNOSIS — Z5181 Encounter for therapeutic drug level monitoring: Secondary | ICD-10-CM

## 2016-02-01 LAB — PROTIME-INR: INR: 1 (ref <=1.1)

## 2016-02-08 ENCOUNTER — Ambulatory Visit (INDEPENDENT_AMBULATORY_CARE_PROVIDER_SITE_OTHER): Payer: Medicare Other | Admitting: Pharmacist

## 2016-02-08 DIAGNOSIS — I480 Paroxysmal atrial fibrillation: Secondary | ICD-10-CM

## 2016-02-08 DIAGNOSIS — Z5181 Encounter for therapeutic drug level monitoring: Secondary | ICD-10-CM

## 2016-02-08 LAB — PROTIME-INR: INR: 1.4 — AB (ref 0.9–1.1)

## 2016-02-10 NOTE — Progress Notes (Signed)
Cardiology Office Note Date:  02/11/2016  Patient ID:  [name] [name], [name] [name] 05-Aug-1946, MRN [id number] PCP:  [name], MD  Electrophysiologist:  Dr. Caryl Comes    Chief Complaint: routine EP visit  History of Present Illness: [name] [name] is a 70 y.o. male with history of NICM w/CRT-D, chronic CHF/systolic, PAFib, NSVT, HLD, PVD with b/l BKA's, arthritis, HTN, DM, CRI stage III  He comes to the office today being seen for Dr Caryl Comes, last seen by him in March and started on Entresto at that visit., he is feeling well today.  Denies any kind of CP, palpitations or SOB, denies symptoms of PND or orthopnea, no near syncope or syncope.  Recent er visit with foley pain discharged from [facility] s/p hip fracture (trip fall in his garden) and surgery 12/28/15, discharged the 10th to a SNF, his lasix was decreased to daily and his aldactone held secondary to lower BP's, with ARF/urinary retention, he was also to have been bridged with lovenox.  He is not on lovenox any longer/at least not sent home from the SNF on lovenox, but c/w plavix.  He has been to his PMD since his hospital stay and his aldactone was resumed.  He sees his PMD today with labs planned. (so I will not have them drawn here)  He denies any bleeding or signs of bleeding, we discussed food with vit K today.  His INRs are drawn by [facility] RN and managed with the coumadin clinic, recently up-titrated last week.   Past Medical History  Diagnosis Date  . Cardiomyopathy, nonischemic ([place])     a. Cath 2003: mild nonobstructive CAD, EF 25% at that time.  [name] PAF (paroxysmal atrial fibrillation) ([place])     a. Noted on ICD interrogation 2012;  b. coumadin d/c'd 01/2013.  [name] NSVT (nonsustained ventricular tachycardia) ([place])     a. Noted on ICD interrogation in 2011.  . High cholesterol     takes Atorvastatin daily  . PAD (peripheral artery disease) ([place])     a. 08/2013 Periph Angio/PTA: Abd Ao nl, RLE- 3v runoff, PT diff dzs, AT 90p, LLE  2v runoff, PT 100, AT 73m (diamondback ORA/chocolate balloon PTA).  . Automatic implantable cardioverter-defibrillator in situ     a. 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  . Dementia   . Arthritis   . History of blood transfusion     no abnormal reaction noted  . Complication of anesthesia   . PONV (postoperative nausea and vomiting)   . Hypertension     takes Coreg daily  . LBBB (left bundle branch block)     takes Coumadin daily  . Anemia     takes Iron pill daily  . Constipation     takes Sennokot daily  . GERD (gastroesophageal reflux disease)     takes Nexium daily  . Chronic systolic CHF (congestive heart failure) ([facility])     takes Furosemide daily as well as Aldactone  . Pneumonia     hx of > 4yr ago  . History of bronchitis     > 1 yr ago  . Headache     occasionally  . Peripheral neuropathy ([facility])     hands;numbness and tingling   . Urinary frequency   . Urinary urgency   . History of kidney stones   . Type II diabetes mellitus ([place])     takes Levemir daily as well as Novolog  . CAD (coronary artery disease)   .  PAF (paroxysmal atrial fibrillation) ([place])   . CKD (chronic kidney disease), stage III     Past Surgical History  Procedure Laterality Date  . Lithotripsy  2001  . Cervical spine surgery  1994  . Cardiac defibrillator placement  06/2009    WITH GENERATOR REPLACED; BiV ICD  . US echocardiography  03/21/2008    EF 30-35%  . Cardiovascular stress test  03/20/2009    EF 33%  . Transluminal atherectomy tibial artery Left 09/12/2013  . Toe amputation  10/04/2013    LEFT GREAT TOE AND 4TH TOE   /   DR Erlinda Hong  . Amputation Left 10/04/2013    Procedure: LEFT GREAT TOE AND SECOND TOE AMPUTATION;  Surgeon: Marianna Payment, MD;  Location: [place];  Service: Orthopedics;  Laterality: Left;  . Colonoscopy    . Leg amputation below knee Left 11/09/2013    DR Erlinda Hong  . Amputation Left 11/09/2013    Procedure: LEFT AMPUTATION BELOW KNEE;   Surgeon: Marianna Payment, MD;  Location: [place];  Service: Orthopedics;  Laterality: Left;  . Amputation Right 01/04/2014    Procedure: Doristine Devoid second and third toe amputation;  Surgeon: Marianna Payment, MD;  Location: [place];  Service: Orthopedics;  Laterality: Right;  . Amputation Right 01/30/2014    Procedure: RIGHT BELOW KNEE AMPUTATION;  Surgeon: Marianna Payment, MD;  Location: [place];  Service: Orthopedics;  Laterality: Right;  . Lower extremity angiogram Bilateral 09/12/2013    Procedure: LOWER EXTREMITY ANGIOGRAM;  Surgeon: Lorretta Harp, MD;  Location: [facility] CATH LAB;  Service: Cardiovascular;  Laterality: Bilateral;  . Abdominal angiogram  09/12/2013    Procedure: ABDOMINAL ANGIOGRAM;  Surgeon: Lorretta Harp, MD;  Location: [facility] CATH LAB;  Service: Cardiovascular;;  . Lower extremity angiogram N/A 12/29/2013    Procedure: LOWER EXTREMITY ANGIOGRAM;  Surgeon: Lorretta Harp, MD;  Location: [facility] CATH LAB;  Service: Cardiovascular;  Laterality: N/A;  . Atherectomy Right 12/29/2013    Procedure: ATHERECTOMY;  Surgeon: Lorretta Harp, MD;  Location: [facility] CATH LAB;  Service: Cardiovascular;  Laterality: Right;  Anterior Tibial Artery  . Cardiac catheterization  10/01/2001    THERE WAS GLOBAL HYPOKINESIS AND EF 25%. THERE APPEARED TO BE GLOBAL DECREASE IN WALL MOTION  . Esophagogastroduodenoscopy    . Stump revision Left 01/03/2015    Procedure: LEFT BELOW KNEE AMPUTATION REVISION;  Surgeon: Leandrew Koyanagi, MD;  Location: [place];  Service: Orthopedics;  Laterality: Left;  . Hip arthroplasty Left 12/28/2015    Procedure: POSTERIOR  APPROACH HEMI HIP ARTHROPLASTY;  Surgeon: Leandrew Koyanagi, MD;  Location: [place];  Service: Orthopedics;  Laterality: Left;    Current Outpatient Prescriptions  Medication Sig Dispense Refill  . atorvastatin (LIPITOR) 10 MG tablet Take 1 tablet (10 mg total) by mouth daily. 30 tablet 1  . carvedilol (COREG) 12.5 MG tablet Take 1 tablet (12.5 mg total) by mouth 2 (two) times  daily with a meal. 60 tablet 1  . cholecalciferol (VITAMIN D) 1000 UNITS tablet Take 1 tablet (1,000 Units total) by mouth daily. 30 tablet 1  . Cholecalciferol (VITAMIN D3) 10000 units TABS Take 1 tablet by mouth daily.    . clopidogrel (PLAVIX) 75 MG tablet Take 1 tablet (75 mg total) by mouth daily with breakfast. 30 tablet 1  . furosemide (LASIX) 40 MG tablet Take 1 tablet (40 mg total) by mouth daily. 30 tablet 1  . insulin detemir (LEVEMIR) 100 UNIT/ML injection 25 unit AM  and 35 unit PM (Patient taking differently: Inject 25-35 Units into the skin See admin instructions. 25 unit AM and 35 unit PM) 10 mL 1  . iron polysaccharides (NIFEREX) 150 MG capsule Take 1 capsule (150 mg total) by mouth daily. 30 capsule 1  . Multiple Vitamin (MULTIVITAMIN WITH MINERALS) TABS tablet Take 1 tablet by mouth daily.    [name] omeprazole (PRILOSEC) 20 MG capsule Take 20 mg by mouth daily.    . potassium chloride SA (K-DUR,KLOR-CON) 20 MEQ tablet Take 1 tablet (20 mEq total) by mouth daily. 30 tablet 1  . pregabalin (LYRICA) 75 MG capsule Take 1 capsule (75 mg total) by mouth 2 (two) times daily. 60 capsule 1  . sacubitril-valsartan (ENTRESTO) 24-26 MG Take 1 tablet by mouth 2 (two) times daily. 60 tablet 6  . senna-docusate (SENOKOT-S) 8.6-50 MG per tablet Take 1 tablet by mouth 2 (two) times daily. 60 tablet 1  . spironolactone (ALDACTONE) 25 MG tablet Take 25 mg by mouth daily. TAKES HALF A TABLET    . tamsulosin (FLOMAX) 0.4 MG CAPS capsule Take 1 capsule (0.4 mg total) by mouth daily after supper. 30 capsule 1  . warfarin (COUMADIN) 10 MG tablet Take 10 mg by mouth daily. Take 10 mg Mon Tues Wed Thurs and Sunday Take a tablet and 1/2 on Fri and Sat     No current facility-administered medications for this visit.    Allergies:   Review of patient's allergies indicates no known allergies.   Social History:  The patient  reports that he has never smoked. He has never used smokeless tobacco. He reports that  he does not drink alcohol or use illicit drugs.   Family History:  The patient's family history includes Diabetes in his father and mother; Heart disease in his mother; Hypertension in his mother.  ROS:  Please see the history of present illness. All other systems are reviewed and otherwise negative.   PHYSICAL EXAM:  VS:  BP 122/58 mmHg  Pulse 77  Ht 5\' 9"  (1.753 m)  Wt 209 lb (94.802 kg)  BMI 30.85 kg/m2 BMI: Body mass index is 30.85 kg/(m^2). Well nourished, well developed, in no acute distress HEENT: normocephalic, atraumatic Neck: no JVD, carotid bruits or masses Cardiac:  normal S1, S2; RRR; no significant murmurs, no rubs, or gallops Lungs:  clear to auscultation bilaterally, no wheezing, rhonchi or rales Abd: soft, nontender,  MS: no deformity or atrophy Ext: b/l BKA's, prosthetics in place Skin: warm and dry, no rash Neuro:  No gross deficits appreciated Psych: euthymic mood, full affect  ICD site is stable, no tethering or discomfort   EKG:  Done 12/27/15 shows SR, IVCD ICD check today, reviewed by myself: battery is good, head very brief ATach and 5 NSVT episodes also very brief, no syatained arrhythmias, no therapies, lead status is stable, he is in SR today, BiVe paced 02/15/13: Echocardiogram Study Conclusions - Left ventricle: The cavity size was mildly dilated. Wall thickness was normal. Systolic function was severely reduced. The estimated ejection fraction was in the range of 20% to 25%. Diffuse hypokinesis. Doppler parameters are consistent with abnormal left ventricular relaxation (grade 1 diastolic dysfunction). - Mitral valve: Mild regurgitation. - Left atrium: The atrium was mildly dilated.   Recent Labs: 12/28/2015: B Natriuretic Peptide 380.3* 01/02/2016: BUN 30*; Creatinine, Ser 1.32*; Hemoglobin 8.1*; Platelets 191; Potassium 4.2; Sodium 140  No results found for requested labs within last 365 days.   CrCl cannot be calculated (Patient has  no serum creatinine result on file.).   Wt Readings from Last 3 Encounters:  02/11/16 209 lb (94.802 kg)  01/02/16 200 lb 13.4 oz (91.1 kg)  10/31/15 204 lb 12.8 oz (92.897 kg)     Other studies reviewed: Additional studies/records reviewed today include: summarized above  DEVICE information: BSCi CRT-D implanted 12/22/03, gen change 07/05/09  ASSESSMENT AND PLAN:  1.  NICM, chronic systolic CHF      exam is euvolemic, no symptoms      On BB/ARB, diuretic tx      ICD with intact function      [employer] remotes  2. PAFib     CHA2DS2Vasc is 5 on warfarin, monitored and managed by coumadin clinic   3. CRI stage III     He is scheduled to see and have labs done today with his PMD  4. Anemia     C/w his PMD  5. PVD s/p b/l BKAs     Appears he has been maintained on Plavix, no ASA       Disposition: he was to have BMET today, though is scheduled for labs and f/u with PMD today as planned and will defer monitoring and management of his post-op anemia and his renal function to his primary.  His coumadin was increased last week and due for INR on Friday with the a/c clinic, 59month remote/lattitude device check, 41mo in-clinic with Dr. Caryl Comes  Current medicines are reviewed at length with the patient today.  The patient did not have any concerns regarding medicines.  Haywood Lasso, PA-C 02/11/2016 10:08 AM     [facility] [place] [id number] [phone] (office)  [phone] (fax)

## 2016-02-11 ENCOUNTER — Encounter: Payer: Self-pay | Admitting: Physician Assistant

## 2016-02-11 ENCOUNTER — Ambulatory Visit (INDEPENDENT_AMBULATORY_CARE_PROVIDER_SITE_OTHER): Payer: Medicare Other | Admitting: Physician Assistant

## 2016-02-11 VITALS — BP 122/58 | HR 77 | Ht 69.0 in | Wt 209.0 lb

## 2016-02-11 DIAGNOSIS — N183 Chronic kidney disease, stage 3 (moderate): Secondary | ICD-10-CM | POA: Diagnosis not present

## 2016-02-11 DIAGNOSIS — I420 Dilated cardiomyopathy: Secondary | ICD-10-CM | POA: Diagnosis not present

## 2016-02-11 DIAGNOSIS — I480 Paroxysmal atrial fibrillation: Secondary | ICD-10-CM

## 2016-02-11 NOTE — Patient Instructions (Addendum)
Medication Instructions:   Your physician recommends that you continue on your current medications as directed. Please refer to the Current Medication list given to you today.   If you need a refill on your cardiac medications before your next appointment, please call your pharmacy.  Labwork:  NONE ORDER TODAY    Testing/Procedures:  NONE ORDER TODAY     Follow-Up:  Your physician wants you to follow-up in:  [place] will receive a reminder letter in the mail two months in advance. If you don't receive a letter, please call our office to schedule the follow-up appointment.   Remote monitoring is used to monitor your Pacemaker of ICD from home. This monitoring reduces the number of office visits required to check your device to one time per year. It allows [place] to keep an eye on the functioning of your device to ensure it is working properly. You are scheduled for a device check from home on .  05/12/2016.Marland KitchenMarland KitchenYou may send your transmission at any time that day. If you have a wireless device, the transmission will be sent automatically. After your physician reviews your transmission, you will receive a postcard with your next transmission date.      Any Other Special Instructions Will Be Listed Below (If Applicable).                                                                                                                                                  Vitamin K Foods and Warfarin  Warfarin is a medicine that helps prevent harmful blood clots by causing blood to clot more slowly. It does this by decreasing the activity of vitamin K, which promotes normal blood clotting. For the dose of warfarin you have been prescribed to work well, you need to get about the same amount of vitamin K from your food from day to day. Suddenly getting a lot more vitamin K could cause your blood to clot too quickly. A sudden decrease in vitamin K intake could cause your blood to  clot too slowly. These changes in vitamin K intake could lead to dangerous blood clotsor to bleeding.  WHAT GENERAL GUIDELINES DO I NEED TO FOLLOW?  Keep your intake of vitamin K consistent from day to day. To do this, you must be aware of which foods contain moderate or high amounts of vitamin K. Listed below are some foods that are very high, high, or moderately high in vitamin K. If you eat these foods, make sure you eat a consistent amount of them from day to day.  Avoid major changes in your diet, or tell your health care provider before changing your diet.  If you take a multivitamin that contains vitamin K, be sure to take it every day.  If you drink green tea, drink the same  amount each day.   WHAT FOODS ARE VERY HIGH IN VITAMIN K?   Greens, such as Swiss chard and beet, collard, mustard, or turnip greens (fresh or frozen, cooked).  Kale (fresh or frozen, cooked).   Parsley (raw).  Spinach (cooked).   WHAT FOODS ARE HIGH IN VITAMIN K?  Asparagus (frozen, cooked).  Broccoli.   Bok choy (cooked).   Brussels sprouts (fresh or frozen, cooked).  Cabbage (cooked).  Coleslaw.  WHAT FOODS ARE MODERATELY HIGH IN VITAMIN K?  Blueberries.  Black-eyed peas.  Endive (raw).   Green leaf lettuce (raw).   Green scallions (raw).  Kale (raw).  Okra (frozen, cooked).  Plantains (fried).  Romaine lettuce (raw).   Sauerkraut (canned).   Spinach (raw).   This information is not intended to replace advice given to you by your health care provider. Make sure you discuss any questions you have with your health care provider.   Document Released: 06/08/2009 Document Revised: 09/01/2014 Document Reviewed: 06/15/2013 Elsevier Interactive Patient Education [employer].

## 2016-02-15 ENCOUNTER — Ambulatory Visit (INDEPENDENT_AMBULATORY_CARE_PROVIDER_SITE_OTHER): Payer: Medicare Other | Admitting: Pharmacist

## 2016-02-15 DIAGNOSIS — Z5181 Encounter for therapeutic drug level monitoring: Secondary | ICD-10-CM

## 2016-02-15 DIAGNOSIS — I480 Paroxysmal atrial fibrillation: Secondary | ICD-10-CM

## 2016-02-15 LAB — PROTIME-INR: INR: 1.3 — AB (ref 0.9–1.1)

## 2016-02-22 ENCOUNTER — Ambulatory Visit (INDEPENDENT_AMBULATORY_CARE_PROVIDER_SITE_OTHER): Payer: Medicare Other | Admitting: Pharmacist

## 2016-02-22 DIAGNOSIS — Z5181 Encounter for therapeutic drug level monitoring: Secondary | ICD-10-CM

## 2016-02-22 DIAGNOSIS — I480 Paroxysmal atrial fibrillation: Secondary | ICD-10-CM

## 2016-02-22 LAB — PROTIME-INR: INR: 2 — AB (ref 0.9–1.1)

## 2016-02-29 ENCOUNTER — Ambulatory Visit (INDEPENDENT_AMBULATORY_CARE_PROVIDER_SITE_OTHER): Payer: Medicare Other | Admitting: Cardiovascular Disease

## 2016-02-29 DIAGNOSIS — Z5181 Encounter for therapeutic drug level monitoring: Secondary | ICD-10-CM

## 2016-02-29 DIAGNOSIS — I480 Paroxysmal atrial fibrillation: Secondary | ICD-10-CM

## 2016-02-29 LAB — PROTIME-INR: INR: 2.2 — AB (ref 0.9–1.1)

## 2016-03-14 ENCOUNTER — Ambulatory Visit (INDEPENDENT_AMBULATORY_CARE_PROVIDER_SITE_OTHER): Payer: Medicare Other | Admitting: Cardiology

## 2016-03-14 DIAGNOSIS — I480 Paroxysmal atrial fibrillation: Secondary | ICD-10-CM

## 2016-03-14 DIAGNOSIS — Z5181 Encounter for therapeutic drug level monitoring: Secondary | ICD-10-CM

## 2016-03-14 LAB — PROTIME-INR: INR: 1.9 — AB (ref 0.9–1.1)

## 2016-04-02 ENCOUNTER — Telehealth: Payer: Self-pay | Admitting: *Deleted

## 2016-04-02 NOTE — Telephone Encounter (Signed)
Spoke with [facility] regarding the pt being overdue for INR check & they stated that the pt was d/c from [facility] on 03/14/16, which was the same day the pt had his last INR checked by them.  Inquired about their policy about informing the Physician Office about the pt services being d/c & she stated normal the [facility] RN notifies the Physician office.  Instructed her that this did not happen in this case & now the pt is overdue for follow-up.   Therefore, I called the pt & set up an appt with the pt's wife for follow-up on Friday.

## 2016-04-04 ENCOUNTER — Ambulatory Visit (INDEPENDENT_AMBULATORY_CARE_PROVIDER_SITE_OTHER): Payer: Medicare Other | Admitting: Pharmacist

## 2016-04-04 DIAGNOSIS — I4891 Unspecified atrial fibrillation: Secondary | ICD-10-CM

## 2016-04-04 DIAGNOSIS — Z5181 Encounter for therapeutic drug level monitoring: Secondary | ICD-10-CM | POA: Diagnosis not present

## 2016-04-04 LAB — POCT INR: INR: 4.6

## 2016-04-15 ENCOUNTER — Ambulatory Visit (INDEPENDENT_AMBULATORY_CARE_PROVIDER_SITE_OTHER): Payer: Medicare Other | Admitting: Pharmacist

## 2016-04-15 ENCOUNTER — Encounter (INDEPENDENT_AMBULATORY_CARE_PROVIDER_SITE_OTHER): Payer: Self-pay

## 2016-04-15 DIAGNOSIS — I4891 Unspecified atrial fibrillation: Secondary | ICD-10-CM

## 2016-04-15 DIAGNOSIS — Z5181 Encounter for therapeutic drug level monitoring: Secondary | ICD-10-CM

## 2016-04-15 LAB — POCT INR: INR: 3.3

## 2016-04-29 DIAGNOSIS — E1151 Type 2 diabetes mellitus with diabetic peripheral angiopathy without gangrene: Secondary | ICD-10-CM | POA: Insufficient documentation

## 2016-04-29 DIAGNOSIS — Z96642 Presence of left artificial hip joint: Secondary | ICD-10-CM | POA: Insufficient documentation

## 2016-04-29 DIAGNOSIS — K5900 Constipation, unspecified: Secondary | ICD-10-CM | POA: Diagnosis not present

## 2016-04-29 DIAGNOSIS — Z89512 Acquired absence of left leg below knee: Secondary | ICD-10-CM | POA: Insufficient documentation

## 2016-04-29 DIAGNOSIS — Z9716 Presence of artificial legs, bilateral (complete) (partial): Secondary | ICD-10-CM | POA: Diagnosis not present

## 2016-04-29 DIAGNOSIS — M1990 Unspecified osteoarthritis, unspecified site: Secondary | ICD-10-CM | POA: Insufficient documentation

## 2016-04-29 DIAGNOSIS — E1140 Type 2 diabetes mellitus with diabetic neuropathy, unspecified: Secondary | ICD-10-CM | POA: Diagnosis not present

## 2016-04-29 DIAGNOSIS — I2510 Atherosclerotic heart disease of native coronary artery without angina pectoris: Secondary | ICD-10-CM | POA: Diagnosis not present

## 2016-04-29 DIAGNOSIS — Z7901 Long term (current) use of anticoagulants: Secondary | ICD-10-CM | POA: Diagnosis not present

## 2016-04-29 DIAGNOSIS — W06XXXA Fall from bed, initial encounter: Secondary | ICD-10-CM | POA: Diagnosis not present

## 2016-04-29 DIAGNOSIS — J189 Pneumonia, unspecified organism: Secondary | ICD-10-CM | POA: Insufficient documentation

## 2016-04-29 DIAGNOSIS — S52021A Displaced fracture of olecranon process without intraarticular extension of right ulna, initial encounter for closed fracture: Secondary | ICD-10-CM | POA: Diagnosis not present

## 2016-04-29 DIAGNOSIS — I452 Bifascicular block: Secondary | ICD-10-CM | POA: Insufficient documentation

## 2016-04-29 DIAGNOSIS — I5022 Chronic systolic (congestive) heart failure: Secondary | ICD-10-CM | POA: Diagnosis not present

## 2016-04-29 DIAGNOSIS — E1122 Type 2 diabetes mellitus with diabetic chronic kidney disease: Secondary | ICD-10-CM | POA: Insufficient documentation

## 2016-04-29 DIAGNOSIS — D649 Anemia, unspecified: Secondary | ICD-10-CM | POA: Insufficient documentation

## 2016-04-29 DIAGNOSIS — I428 Other cardiomyopathies: Secondary | ICD-10-CM | POA: Insufficient documentation

## 2016-04-29 DIAGNOSIS — E7800 Pure hypercholesterolemia, unspecified: Secondary | ICD-10-CM | POA: Insufficient documentation

## 2016-04-29 DIAGNOSIS — I130 Hypertensive heart and chronic kidney disease with heart failure and stage 1 through stage 4 chronic kidney disease, or unspecified chronic kidney disease: Secondary | ICD-10-CM | POA: Diagnosis not present

## 2016-04-29 DIAGNOSIS — Z794 Long term (current) use of insulin: Secondary | ICD-10-CM | POA: Diagnosis not present

## 2016-04-29 DIAGNOSIS — F0390 Unspecified dementia without behavioral disturbance: Secondary | ICD-10-CM | POA: Insufficient documentation

## 2016-04-29 DIAGNOSIS — Z95810 Presence of automatic (implantable) cardiac defibrillator: Secondary | ICD-10-CM | POA: Diagnosis not present

## 2016-04-29 DIAGNOSIS — Z79899 Other long term (current) drug therapy: Secondary | ICD-10-CM | POA: Insufficient documentation

## 2016-04-29 DIAGNOSIS — Z89511 Acquired absence of right leg below knee: Secondary | ICD-10-CM | POA: Insufficient documentation

## 2016-04-29 DIAGNOSIS — K219 Gastro-esophageal reflux disease without esophagitis: Secondary | ICD-10-CM | POA: Insufficient documentation

## 2016-04-29 DIAGNOSIS — I480 Paroxysmal atrial fibrillation: Secondary | ICD-10-CM | POA: Insufficient documentation

## 2016-04-29 DIAGNOSIS — R531 Weakness: Principal | ICD-10-CM | POA: Insufficient documentation

## 2016-04-29 DIAGNOSIS — Z7902 Long term (current) use of antithrombotics/antiplatelets: Secondary | ICD-10-CM | POA: Insufficient documentation

## 2016-04-30 ENCOUNTER — Emergency Department (HOSPITAL_COMMUNITY): Payer: Medicare Other

## 2016-04-30 ENCOUNTER — Observation Stay (HOSPITAL_COMMUNITY)
Admission: EM | Admit: 2016-04-30 | Discharge: 2016-05-03 | Disposition: A | Discharge status: Skilled Nursing Facility | Payer: Medicare Other | Attending: Internal Medicine | Admitting: Internal Medicine

## 2016-04-30 ENCOUNTER — Encounter (HOSPITAL_COMMUNITY): Payer: Self-pay | Admitting: Emergency Medicine

## 2016-04-30 ENCOUNTER — Observation Stay (HOSPITAL_COMMUNITY): Payer: Medicare Other

## 2016-04-30 DIAGNOSIS — I10 Essential (primary) hypertension: Secondary | ICD-10-CM | POA: Diagnosis present

## 2016-04-30 DIAGNOSIS — I5042 Chronic combined systolic (congestive) and diastolic (congestive) heart failure: Secondary | ICD-10-CM | POA: Diagnosis present

## 2016-04-30 DIAGNOSIS — R296 Repeated falls: Secondary | ICD-10-CM

## 2016-04-30 DIAGNOSIS — I428 Other cardiomyopathies: Secondary | ICD-10-CM

## 2016-04-30 DIAGNOSIS — M25529 Pain in unspecified elbow: Secondary | ICD-10-CM

## 2016-04-30 DIAGNOSIS — R531 Weakness: Secondary | ICD-10-CM

## 2016-04-30 DIAGNOSIS — I480 Paroxysmal atrial fibrillation: Secondary | ICD-10-CM | POA: Diagnosis present

## 2016-04-30 DIAGNOSIS — Z89511 Acquired absence of right leg below knee: Secondary | ICD-10-CM

## 2016-04-30 DIAGNOSIS — J189 Pneumonia, unspecified organism: Secondary | ICD-10-CM | POA: Diagnosis not present

## 2016-04-30 DIAGNOSIS — E1140 Type 2 diabetes mellitus with diabetic neuropathy, unspecified: Secondary | ICD-10-CM | POA: Diagnosis present

## 2016-04-30 DIAGNOSIS — R509 Fever, unspecified: Secondary | ICD-10-CM

## 2016-04-30 DIAGNOSIS — Z89512 Acquired absence of left leg below knee: Secondary | ICD-10-CM

## 2016-04-30 DIAGNOSIS — W19XXXA Unspecified fall, initial encounter: Secondary | ICD-10-CM

## 2016-04-30 LAB — CBC WITH DIFFERENTIAL/PLATELET
BASOS PCT: 0 %
BASOS PCT: 0 %
Basophils Absolute: 0 10*3/uL (ref 0.0–0.1)
Basophils Absolute: 0 10*3/uL (ref 0.0–0.1)
EOS ABS: 0 10*3/uL (ref 0.0–0.7)
EOS ABS: 0.1 10*3/uL (ref 0.0–0.7)
EOS PCT: 1 %
EOS PCT: 1 %
HCT: 31.2 % — ABNORMAL LOW (ref 39.0–52.0)
HEMATOCRIT: 30.2 % — AB (ref 39.0–52.0)
Hemoglobin: 10.5 g/dL — ABNORMAL LOW (ref 13.0–17.0)
Hemoglobin: 9.9 g/dL — ABNORMAL LOW (ref 13.0–17.0)
LYMPHS ABS: 0.6 10*3/uL — AB (ref 0.7–4.0)
Lymphocytes Relative: 8 %
Lymphocytes Relative: 12 %
Lymphs Abs: 0.7 10*3/uL (ref 0.7–4.0)
MCH: 23.9 pg — ABNORMAL LOW (ref 26.0–34.0)
MCH: 24.4 pg — AB (ref 26.0–34.0)
MCHC: 32.8 g/dL (ref 30.0–36.0)
MCHC: 33.7 g/dL (ref 30.0–36.0)
MCV: 72.6 fL — ABNORMAL LOW (ref 78.0–100.0)
MCV: 72.9 fL — ABNORMAL LOW (ref 78.0–100.0)
MONO ABS: 0.6 10*3/uL (ref 0.1–1.0)
MONOS PCT: 11 %
Monocytes Absolute: 0.6 10*3/uL (ref 0.1–1.0)
Monocytes Relative: 8 %
Neutro Abs: 6.1 10*3/uL (ref 1.7–7.7)
Neutro Abs: 4 10*3/uL (ref 1.7–7.7)
Neutrophils Relative %: 83 %
Neutrophils Relative %: 76 %
PLATELETS: 153 10*3/uL (ref 150–400)
PLATELETS: 174 10*3/uL (ref 150–400)
RBC: 4.14 MIL/uL — ABNORMAL LOW (ref 4.22–5.81)
RBC: 4.3 MIL/uL (ref 4.22–5.81)
RDW: 18.2 % — ABNORMAL HIGH (ref 11.5–15.5)
RDW: 18.1 % — AB (ref 11.5–15.5)
WBC: 5.3 10*3/uL (ref 4.0–10.5)
WBC: 7.3 10*3/uL (ref 4.0–10.5)

## 2016-04-30 LAB — URINE MICROSCOPIC-ADD ON

## 2016-04-30 LAB — TROPONIN I
Troponin I: <0.03 ng/mL (ref <0.03)
Troponin I: <0.03 ng/mL (ref <0.03)

## 2016-04-30 LAB — I-STAT CG4 LACTIC ACID, ED
LACTIC ACID, VENOUS: 1.41 mmol/L (ref 0.5–1.9)
LACTIC ACID, VENOUS: 0.82 mmol/L (ref 0.5–1.9)

## 2016-04-30 LAB — URINALYSIS, ROUTINE W REFLEX MICROSCOPIC
Bilirubin Urine: NEGATIVE
Glucose, UA: 100 mg/dL — AB
Ketones, ur: NEGATIVE mg/dL
LEUKOCYTES UA: NEGATIVE
Nitrite: NEGATIVE
PROTEIN: NEGATIVE mg/dL
Specific Gravity, Urine: 1.015 (ref 1.005–1.030)
pH: 5.5 (ref 5.0–8.0)

## 2016-04-30 LAB — PROTIME-INR
INR: 3.16
PROTHROMBIN TIME: 33.2 s — AB (ref 11.4–15.2)

## 2016-04-30 LAB — I-STAT TROPONIN, ED: Troponin i, poc: 0.01 ng/mL (ref 0.00–0.08)

## 2016-04-30 LAB — COMPREHENSIVE METABOLIC PANEL
ALBUMIN: 3 g/dL — AB (ref 3.5–5.0)
ALK PHOS: 54 U/L (ref 38–126)
ALK PHOS: 54 U/L (ref 38–126)
ALT: 15 U/L — AB (ref 17–63)
ALT: 16 U/L — AB (ref 17–63)
AST: 17 U/L (ref 15–41)
AST: 18 U/L (ref 15–41)
Albumin: 3.4 g/dL — ABNORMAL LOW (ref 3.5–5.0)
Anion gap: 8 (ref 5–15)
Anion gap: 7 (ref 5–15)
BILIRUBIN TOTAL: 0.4 mg/dL (ref 0.3–1.2)
BILIRUBIN TOTAL: 0.6 mg/dL (ref 0.3–1.2)
BUN: 35 mg/dL — AB (ref 6–20)
BUN: 37 mg/dL — AB (ref 6–20)
CALCIUM: 8.3 mg/dL — AB (ref 8.9–10.3)
CALCIUM: 8.5 mg/dL — AB (ref 8.9–10.3)
CHLORIDE: 105 mmol/L (ref 101–111)
CO2: 23 mmol/L (ref 22–32)
CO2: 23 mmol/L (ref 22–32)
CREATININE: 1.77 mg/dL — AB (ref 0.61–1.24)
CREATININE: 1.84 mg/dL — AB (ref 0.61–1.24)
Chloride: 107 mmol/L (ref 101–111)
GFR calc Af Amer: 42 mL/min — ABNORMAL LOW (ref >60)
GFR calc non Af Amer: 37 mL/min — ABNORMAL LOW (ref >60)
GFR, EST AFRICAN AMERICAN: 41 mL/min — AB (ref >60)
GFR, EST NON AFRICAN AMERICAN: 35 mL/min — AB (ref >60)
GLUCOSE: 257 mg/dL — AB (ref 65–99)
Glucose, Bld: 253 mg/dL — ABNORMAL HIGH (ref 65–99)
Potassium: 4.6 mmol/L (ref 3.5–5.1)
Potassium: 4.2 mmol/L (ref 3.5–5.1)
SODIUM: 137 mmol/L (ref 135–145)
Sodium: 136 mmol/L (ref 135–145)
Total Protein: 7.1 g/dL (ref 6.5–8.1)
Total Protein: 6.5 g/dL (ref 6.5–8.1)

## 2016-04-30 LAB — GLUCOSE, CAPILLARY
GLUCOSE-CAPILLARY: 368 mg/dL — AB (ref 65–99)
GLUCOSE-CAPILLARY: 318 mg/dL — AB (ref 65–99)
Glucose-Capillary: 225 mg/dL — ABNORMAL HIGH (ref 65–99)
Glucose-Capillary: 319 mg/dL — ABNORMAL HIGH (ref 65–99)

## 2016-04-30 LAB — CORTISOL: CORTISOL PLASMA: 11.4 ug/dL

## 2016-04-30 LAB — CBG MONITORING, ED: GLUCOSE-CAPILLARY: 249 mg/dL — AB (ref 65–99)

## 2016-04-30 LAB — CK: Total CK: 201 U/L (ref 49–397)

## 2016-04-30 LAB — TSH: TSH: 2.516 u[IU]/mL (ref 0.350–4.500)

## 2016-04-30 MED ORDER — SENNOSIDES-DOCUSATE SODIUM 8.6-50 MG PO TABS
1.0000 | ORAL_TABLET | Freq: Two times a day (BID) | ORAL | Status: DC
  Administered 2016-04-30 – 2016-05-03 (×7): 1 via ORAL
  Filled 2016-04-30 (×7): qty 1

## 2016-04-30 MED ORDER — VITAMIN D 1000 UNITS PO TABS
ORAL_TABLET | Freq: Every day | ORAL | Status: DC
Start: 2016-04-30 — End: 2016-05-03
  Administered 2016-04-30 – 2016-05-03 (×4): 1000 [IU] via ORAL
  Filled 2016-04-30 (×4): qty 1

## 2016-04-30 MED ORDER — INSULIN ASPART 100 UNIT/ML ~~LOC~~ SOLN
0.0000 [IU] | Freq: Three times a day (TID) | SUBCUTANEOUS | Status: DC
  Administered 2016-04-30: 9 [IU] via SUBCUTANEOUS
  Administered 2016-05-01 (×2): 2 [IU] via SUBCUTANEOUS
  Administered 2016-05-01 – 2016-05-02 (×2): 3 [IU] via SUBCUTANEOUS
  Administered 2016-05-02: 7 [IU] via SUBCUTANEOUS
  Administered 2016-05-02: 5 [IU] via SUBCUTANEOUS
  Administered 2016-05-03: 1 [IU] via SUBCUTANEOUS
  Administered 2016-05-03: 3 [IU] via SUBCUTANEOUS

## 2016-04-30 MED ORDER — INSULIN DETEMIR 100 UNIT/ML ~~LOC~~ SOLN
35.0000 [IU] | Freq: Every day | SUBCUTANEOUS | Status: DC
  Administered 2016-04-30 – 2016-05-02 (×3): 35 [IU] via SUBCUTANEOUS
  Filled 2016-04-30 (×4): qty 0.35

## 2016-04-30 MED ORDER — WARFARIN SODIUM 5 MG PO TABS
7.5000 mg | ORAL_TABLET | Freq: Once | ORAL | Status: AC
  Administered 2016-04-30: 7.5 mg via ORAL
  Filled 2016-04-30: qty 1

## 2016-04-30 MED ORDER — PANTOPRAZOLE SODIUM 40 MG PO TBEC
40.0000 mg | DELAYED_RELEASE_TABLET | Freq: Every day | ORAL | Status: DC
  Administered 2016-04-30 – 2016-05-03 (×4): 40 mg via ORAL
  Filled 2016-04-30 (×4): qty 1

## 2016-04-30 MED ORDER — INSULIN ASPART 100 UNIT/ML ~~LOC~~ SOLN
0.0000 [IU] | Freq: Every day | SUBCUTANEOUS | Status: DC
  Administered 2016-04-30 – 2016-05-01 (×2): 4 [IU] via SUBCUTANEOUS
  Administered 2016-05-02: 3 [IU] via SUBCUTANEOUS

## 2016-04-30 MED ORDER — ONDANSETRON HCL 4 MG/2ML IJ SOLN
4.0000 mg | Freq: Four times a day (QID) | INTRAMUSCULAR | Status: DC | PRN
Start: 2016-04-30 — End: 2016-05-03

## 2016-04-30 MED ORDER — WARFARIN - PHARMACIST DOSING INPATIENT: Freq: Every day | Status: DC

## 2016-04-30 MED ORDER — ADULT MULTIVITAMIN W/MINERALS CH
1.0000 | ORAL_TABLET | Freq: Every day | ORAL | Status: DC
  Administered 2016-04-30 – 2016-05-03 (×4): 1 via ORAL
  Filled 2016-04-30 (×4): qty 1

## 2016-04-30 MED ORDER — INSULIN DETEMIR 100 UNIT/ML ~~LOC~~ SOLN
25.0000 [IU] | Freq: Every day | SUBCUTANEOUS | Status: DC
Start: 2016-04-30 — End: 2016-05-03
  Administered 2016-04-30 – 2016-05-03 (×4): 25 [IU] via SUBCUTANEOUS
  Filled 2016-04-30 (×4): qty 0.25

## 2016-04-30 MED ORDER — TAMSULOSIN HCL 0.4 MG PO CAPS
0.4000 mg | ORAL_CAPSULE | Freq: Every day | ORAL | Status: DC
  Administered 2016-04-30 – 2016-05-02 (×3): 0.4 mg via ORAL
  Filled 2016-04-30 (×3): qty 1

## 2016-04-30 MED ORDER — ONDANSETRON HCL 4 MG PO TABS: 4.0000 mg | ORAL_TABLET | Freq: Four times a day (QID) | ORAL | Status: DC | PRN

## 2016-04-30 MED ORDER — SODIUM CHLORIDE 0.9 % IV BOLUS (SEPSIS)
500.0000 mL | Freq: Once | INTRAVENOUS | Status: AC
  Administered 2016-04-30: 500 mL via INTRAVENOUS

## 2016-04-30 MED ORDER — SACUBITRIL-VALSARTAN 24-26 MG PO TABS
1.0000 | ORAL_TABLET | Freq: Two times a day (BID) | ORAL | Status: DC
  Administered 2016-04-30 – 2016-05-03 (×7): 1 via ORAL
  Filled 2016-04-30 (×7): qty 1

## 2016-04-30 MED ORDER — VITAMIN D3 25 MCG (1000 UNIT) PO TABS: 1000.0000 [IU] | ORAL_TABLET | Freq: Every day | ORAL | Status: DC

## 2016-04-30 MED ORDER — POLYSACCHARIDE IRON COMPLEX 150 MG PO CAPS
150.0000 mg | ORAL_CAPSULE | Freq: Every day | ORAL | Status: DC
  Administered 2016-04-30 – 2016-05-03 (×4): 150 mg via ORAL
  Filled 2016-04-30 (×4): qty 1

## 2016-04-30 MED ORDER — DEXTROSE 5 % IV SOLN
2.0000 g | Freq: Two times a day (BID) | INTRAVENOUS | Status: DC
  Administered 2016-04-30 – 2016-05-03 (×7): 2 g via INTRAVENOUS
  Filled 2016-04-30 (×8): qty 2

## 2016-04-30 MED ORDER — SPIRONOLACTONE 25 MG PO TABS
25.0000 mg | ORAL_TABLET | Freq: Every day | ORAL | Status: DC
  Administered 2016-04-30 – 2016-05-03 (×4): 25 mg via ORAL
  Filled 2016-04-30 (×4): qty 1

## 2016-04-30 MED ORDER — VANCOMYCIN HCL IN DEXTROSE 750-5 MG/150ML-% IV SOLN
750.0000 mg | Freq: Two times a day (BID) | INTRAVENOUS | Status: DC
  Administered 2016-04-30 – 2016-05-02 (×5): 750 mg via INTRAVENOUS
  Filled 2016-04-30 (×5): qty 150

## 2016-04-30 MED ORDER — PREGABALIN 25 MG PO CAPS
75.0000 mg | ORAL_CAPSULE | Freq: Two times a day (BID) | ORAL | Status: DC
  Administered 2016-04-30 – 2016-05-03 (×7): 75 mg via ORAL
  Filled 2016-04-30 (×7): qty 1

## 2016-04-30 MED ORDER — ACETAMINOPHEN 650 MG RE SUPP
650.0000 mg | Freq: Four times a day (QID) | RECTAL | Status: DC | PRN
  Filled 2016-04-30: qty 1

## 2016-04-30 MED ORDER — VANCOMYCIN HCL IN DEXTROSE 1-5 GM/200ML-% IV SOLN
1000.0000 mg | Freq: Once | INTRAVENOUS | Status: AC
  Administered 2016-04-30: 1000 mg via INTRAVENOUS
  Filled 2016-04-30: qty 200

## 2016-04-30 MED ORDER — CARVEDILOL 12.5 MG PO TABS
12.5000 mg | ORAL_TABLET | Freq: Two times a day (BID) | ORAL | Status: DC
  Administered 2016-04-30 – 2016-05-03 (×7): 12.5 mg via ORAL
  Filled 2016-04-30 (×7): qty 1

## 2016-04-30 MED ORDER — ATORVASTATIN CALCIUM 10 MG PO TABS
10.0000 mg | ORAL_TABLET | Freq: Every day | ORAL | Status: DC
  Administered 2016-04-30 – 2016-05-03 (×4): 10 mg via ORAL
  Filled 2016-04-30 (×4): qty 1

## 2016-04-30 MED ORDER — CLOPIDOGREL BISULFATE 75 MG PO TABS
75.0000 mg | ORAL_TABLET | Freq: Every day | ORAL | Status: DC
  Administered 2016-04-30 – 2016-05-02 (×3): 75 mg via ORAL
  Filled 2016-04-30 (×3): qty 1

## 2016-04-30 MED ORDER — PIPERACILLIN-TAZOBACTAM 3.375 G IVPB 30 MIN
3.3750 g | Freq: Once | INTRAVENOUS | Status: AC
  Administered 2016-04-30: 3.375 g via INTRAVENOUS
  Filled 2016-04-30: qty 50

## 2016-04-30 MED ORDER — ACETAMINOPHEN 325 MG PO TABS
650.0000 mg | ORAL_TABLET | Freq: Four times a day (QID) | ORAL | Status: DC | PRN
  Administered 2016-04-30 – 2016-05-03 (×6): 650 mg via ORAL
  Filled 2016-04-30 (×7): qty 2

## 2016-04-30 NOTE — ED Notes (Signed)
Patient transported to X-ray 

## 2016-04-30 NOTE — Progress Notes (Signed)
ANTICOAGULATION CONSULT NOTE - Initial Consult  Pharmacy Consult for warfarin Indication: atrial fibrillation  No Known Allergies  Patient Measurements: Height: 5\' 9"  (175.3 cm) Weight: 218 lb 1.6 oz (98.9 kg) (w/ prostetic legs on) IBW/kg (Calculated) : 70.7 Heparin Dosing Weight:   Vital Signs: Temp: 98.8 F (37.1 C) (09/06 0535) Temp Source: Oral (09/06 0535) BP: 118/56 (09/06 0535) Pulse Rate: 85 (09/06 0535)  Labs:  Recent Labs  04/30/16 0047  HGB 10.5*  HCT 31.2*  PLT 174  CREATININE 1.84*    Estimated Creatinine Clearance: 42.1 mL/min (by C-G formula based on SCr of 1.84 mg/dL).   Medical History: Past Medical History:  Diagnosis Date  . Anemia    takes Iron pill daily  . Arthritis   . Automatic implantable cardioverter-defibrillator in situ    a. 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  . CAD (coronary artery disease)   . Cardiomyopathy, nonischemic ([place])    a. Cath 2003: mild nonobstructive CAD, EF 25% at that time.  . Chronic systolic CHF (congestive heart failure) (HCC)    takes Furosemide daily as well as Aldactone  . CKD (chronic kidney disease), stage III   . Complication of anesthesia   . Constipation    takes Sennokot daily  . Dementia   . GERD (gastroesophageal reflux disease)    takes Nexium daily  . Headache    occasionally  . High cholesterol    takes Atorvastatin daily  . History of blood transfusion    no abnormal reaction noted  . History of bronchitis    > 1 yr ago  . History of kidney stones   . Hypertension    takes Coreg daily  . LBBB (left bundle branch block)    takes Coumadin daily  . NSVT (nonsustained ventricular tachycardia) ([place])    a. Noted on ICD interrogation in 2011.  [name] PAD (peripheral artery disease) ([place])    a. 08/2013 Periph Angio/PTA: Abd Ao nl, RLE- 3v runoff, PT diff dzs, AT 90p, LLE 2v runoff, PT 100, AT 97m (diamondback ORA/chocolate balloon PTA).  [name] PAF (paroxysmal atrial  fibrillation) ([place])    a. Noted on ICD interrogation 2012;  b. coumadin d/c'd 01/2013.  [name] PAF (paroxysmal atrial fibrillation) ([place])   . Peripheral neuropathy (HCC)    hands;numbness and tingling   . Pneumonia    hx of > 29yr ago  . PONV (postoperative nausea and vomiting)   . Type II diabetes mellitus ([place])    takes Levemir daily as well as Novolog  . Urinary frequency   . Urinary urgency     Medications:  Prescriptions Prior to Admission  Medication Sig Dispense Refill Last Dose  . atorvastatin (LIPITOR) 10 MG tablet Take 1 tablet (10 mg total) by mouth daily. 30 tablet 1 04/29/2016 at Unknown time  . carvedilol (COREG) 12.5 MG tablet Take 1 tablet (12.5 mg total) by mouth 2 (two) times daily with a meal. 60 tablet 1 04/29/2016 at 1800  . cholecalciferol (VITAMIN D) 1000 UNITS tablet Take 1 tablet (1,000 Units total) by mouth daily. 30 tablet 1 04/29/2016 at Unknown time  . Cholecalciferol (VITAMIN D3) 10000 units TABS Take 1 tablet by mouth daily.   04/29/2016 at Unknown time  . clopidogrel (PLAVIX) 75 MG tablet Take 1 tablet (75 mg total) by mouth daily with breakfast. 30 tablet 1 04/29/2016 at 1100  . furosemide (LASIX) 40 MG tablet Take 1 tablet (40 mg total) by mouth daily. 30 tablet  1 04/29/2016 at Unknown time  . insulin detemir (LEVEMIR) 100 UNIT/ML injection 25 unit AM and 35 unit PM (Patient taking differently: Inject 25-35 Units into the skin See admin instructions. 25 unit AM and 35 unit PM) 10 mL 1 04/29/2016 at Unknown time  . iron polysaccharides (NIFEREX) 150 MG capsule Take 1 capsule (150 mg total) by mouth daily. 30 capsule 1 04/29/2016 at Unknown time  . Multiple Vitamin (MULTIVITAMIN WITH MINERALS) TABS tablet Take 1 tablet by mouth daily.   04/29/2016 at Unknown time  . omeprazole (PRILOSEC) 20 MG capsule Take 20 mg by mouth daily.   04/29/2016 at Unknown time  . potassium chloride SA (K-DUR,KLOR-CON) 20 MEQ tablet Take 1 tablet (20 mEq total) by mouth daily. 30 tablet 1 04/29/2016 at  Unknown time  . pregabalin (LYRICA) 75 MG capsule Take 1 capsule (75 mg total) by mouth 2 (two) times daily. 60 capsule 1 04/29/2016 at Unknown time  . sacubitril-valsartan (ENTRESTO) 24-26 MG Take 1 tablet by mouth 2 (two) times daily. 60 tablet 6 04/29/2016 at Unknown time  . senna-docusate (SENOKOT-S) 8.6-50 MG per tablet Take 1 tablet by mouth 2 (two) times daily. 60 tablet 1 04/29/2016 at Unknown time  . spironolactone (ALDACTONE) 25 MG tablet Take 25 mg by mouth daily. TAKES HALF A TABLET   04/29/2016 at Unknown time  . tamsulosin (FLOMAX) 0.4 MG CAPS capsule Take 1 capsule (0.4 mg total) by mouth daily after supper. 30 capsule 1 04/29/2016 at Unknown time  . warfarin (COUMADIN) 10 MG tablet Take 10 mg by mouth daily. Take 10 mg Mon, Wed , Friday .  Take 15mg  on Sunday, tues, Thurs and Sat   04/29/2016 at 1800    Assessment: Patient in ED with PNA.  Patient on chronic warfarin for hx of Afib.  Last dose warfarin noted 9/5 at 1800.   Patient's current dosing noted to be 15mg  daily except 10mg  on M/W/F.  Goal of Therapy:  INR 2-3    Plan:  Will await results from ordered INR before making further plans.    Tyler Deis, Shea Stakes [place]ford 04/30/2016,5:45 AM

## 2016-04-30 NOTE — Clinical Social Work Placement (Signed)
   CLINICAL SOCIAL WORK PLACEMENT  NOTE  Date:  04/30/2016  Patient Details  Name: [name] [name] MRN: [id number] Date of Birth: May 08, 1946  Clinical Social Work is seeking post-discharge placement for this patient at the [place] level of care (*CSW will initial, date and re-position this form in  chart as items are completed):  Yes   Patient/family provided with [place] Work Department's list of facilities offering this level of care within the geographic area requested by the patient (or if unable, by the patient's family).  Yes   Patient/family informed of their freedom to choose among providers that offer the needed level of care, that participate in Medicare, Medicaid or managed care program needed by the patient, have an available bed and are willing to accept the patient.  Yes   Patient/family informed of [place]'s ownership interest in [facility] and [facility], as well as of the fact that they are under no obligation to receive care at these facilities.  PASRR submitted to EDS on       PASRR number received on       Existing PASRR number confirmed on 04/30/16     FL2 transmitted to all facilities in geographic area requested by pt/family on 04/30/16     FL2 transmitted to all facilities within larger geographic area on       Patient informed that his/her managed care company has contracts with or will negotiate with certain facilities, including the following:            Patient/family informed of bed offers received.  Patient chooses bed at       Physician recommends and patient chooses bed at      Patient to be transferred to   on  .  Patient to be transferred to facility by       Patient family notified on   of transfer.  Name of family member notified:        PHYSICIAN       Additional Comment:    _______________________________________________ Standley Brooking, LCSW 04/30/2016, 2:51 PM

## 2016-04-30 NOTE — Progress Notes (Signed)
PROGRESS NOTE    LYNDALE MARAGH  [id number] DOB: Feb 21, 1946 DOA: 04/30/2016 PCP: [name], MD   Chief Complaint  Patient presents with  . Weakness     Brief Narrative:  HPI on 04/30/2016 by Dr. Gean Birchwood Hollace Kinnier Czaplewski is a 70 y.o. 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 Patient wife states that patient had a fall while trying to get out of the bed but denies losing consciousness. Since the fall patient is finding it difficult to walk. CT of the head and x-ray of the pelvis are unremarkable. Chest x-ray shows possible pneumonia with congestion. Patient was started on antibiotics for pneumonia and admitted for further management of weakness. On exam patient appears nonfocal but generally weak. As per patient wife patient has been only receiving Lasix 40 mg daily over the last 1 month since patient has been urinating more than usual. Patient is supposed to be on Lasix 40 mg twice daily.  Assessment & Plan   Generalized weakness/fall -Likely secondary to deconditioning versus hypertension -Lasix currently held -Troponins cycled and negative -TSH 2.516 -CK 201 -PT consult and appreciated recommending skilled nursing facility -Will order orthostatic vitals  Questionable pneumonia -She has had cough for approximately 2-3 weeks -Per wife, patient has had some episodes of choking -Speech therapy consulted -Chest x-ray: Mild vascular congestion with cardiomegaly, mild left basilar opacity may reflect pneumonia -Currently on vancomycin and cefepime  Paroxysmal nature fibrillation -Currently in sinus rhythm, rate controlled -Continue Coumadin per pharmacy -Continue Coreg  Nonischemic  cardiomyopathy/Chronic systolic CHF -Echocardiogram in 2014 showed an EF between 25%, status post defibrillator placement -Lasix currently held due to weakness and fall -Continue Ernesto and spironolactone  Diabetes mellitus, type II -Continue Levemir comments complaints on CBG monitoring  Chronic anemia -Hemoglobin currently 9.9 (baseline 8) -Continue to monitor CBC  Hyperlipidemia -Continue statin  Chronic kidney disease, stage III -Currently appears to be at baseline, continue to monitor BMP  Right elbow pain -Obtained xray: Olecranon spur with overlying soft tissue swelling which could represent edema or bursal collection -Patient denies trauma, although did fall prior to admission -Continue supportive care -Unlikely due to infection this patient currently afebrile with no leukocytosis  DVT Prophylaxis  Coumadin   Code Status: Full  Family Communication: None at bedside  Disposition Plan:   Consultants none  Procedures  none  Antibiotics   Anti-infectives    Start     Dose/Rate Route Frequency Ordered Stop   04/30/16 1200  vancomycin (VANCOCIN) IVPB 750 mg/150 ml premix     750 mg 150 mL/hr over 60 Minutes Intravenous Every 12 hours 04/30/16 0520     04/30/16 0445  ceFEPIme (MAXIPIME) 2 g in dextrose 5 % 50 mL IVPB     2 g 100 mL/hr over 30 Minutes Intravenous 2 times daily 04/30/16 0425 05/08/16 0759   04/30/16 0045  piperacillin-tazobactam (ZOSYN) IVPB 3.375 g     3.375 g 100 mL/hr over 30 Minutes Intravenous  Once 04/30/16 0042 04/30/16 0155   04/30/16 0045  vancomycin (VANCOCIN) IVPB 1000 mg/200 mL premix     1,000 mg 200 mL/hr over 60 Minutes Intravenous  Once 04/30/16 0042 04/30/16 0255      Subjective:   [name] seen and examined today.  Patient complains of feeling weak, and right elbow pain. He denies any trauma to the right elbow with this fall, or hitting it recently. Patient does endorse having a dry cough for approximately 2-3  weeks. Currently denies chest pain, shortness of breath, abdominal pain, nausea or vomiting, diarrhea or constipation.    Objective:   Vitals:   04/30/16 0317 04/30/16 0400 04/30/16 0514 04/30/16 0535  BP: 109/65 113/63 109/64 (!) 118/56  Pulse: 83 85 85 85  Resp: 19 22 16 16   Temp:    98.8 F (37.1 C)  TempSrc:    Oral  SpO2: 92% 95% 98% 98%  Weight:    98.9 kg (218 lb 1.6 oz)  Height:    5\' 9"  (1.753 m)    Intake/Output Summary (Last 24 hours) at 04/30/16 1326 Last data filed at 04/30/16 0611  Gross per 24 hour  Intake               50 ml  Output              600 ml  Net             -550 ml   Filed Weights   04/30/16 0005 04/30/16 0535  Weight: 94.8 kg (209 lb) 98.9 kg (218 lb 1.6 oz)    Exam  General: Well developed, well nourished, NAD, appears stated age  98: NCAT,mucous membranes moist.   Cardiovascular: S1 S2 auscultated, no rubs, murmurs or gallops. Regular rate and rhythm.  Respiratory: Clear to auscultation bilaterally with equal chest rise  Abdomen: Soft, obese,  nontender, nondistended, + bowel sounds  Extremities:  B/L LE BKA  Neuro: AAOx3, nonfocal  Psych: Normal affect and demeanor with intact judgement and insight   Data Reviewed: I have personally reviewed following labs and imaging studies  CBC:  Recent Labs Lab 04/30/16 0047 04/30/16 0556  WBC 7.3 5.3  NEUTROABS 6.1 4.0  HGB 10.5* 9.9*  HCT 31.2* 30.2*  MCV 72.6* 72.9*  PLT 174 0000000   Basic Metabolic Panel:  Recent Labs Lab 04/30/16 0047 04/30/16 0556  NA 136 137  K 4.6 4.2  CL 105 107  CO2 23 23  GLUCOSE 253* 257*  BUN 35* 37*  CREATININE 1.84* 1.77*  CALCIUM 8.5* 8.3*   GFR: Estimated Creatinine Clearance: 43.8 mL/min (by C-G formula based on SCr of 1.77 mg/dL). Liver Function Tests:  Recent Labs Lab 04/30/16 0047 04/30/16 0556  AST 18 17  ALT 16* 15*  ALKPHOS 54 54  BILITOT 0.4 0.6  PROT 7.1 6.5  ALBUMIN 3.4* 3.0*   No results for input(s): LIPASE,  AMYLASE in the last 168 hours. No results for input(s): AMMONIA in the last 168 hours. Coagulation Profile:  Recent Labs Lab 04/30/16 0556  INR 3.16   Cardiac Enzymes:  Recent Labs Lab 04/30/16 0556 04/30/16 1153  CKTOTAL 201  --   TROPONINI <0.03 <0.03   BNP (last 3 results) No results for input(s): PROBNP in the last 8760 hours. HbA1C: No results for input(s): HGBA1C in the last 72 hours. CBG:  Recent Labs Lab 04/30/16 0003 04/30/16 0737 04/30/16 1152  GLUCAP 249* 225* 319*   Lipid Profile: No results for input(s): CHOL, HDL, LDLCALC, TRIG, CHOLHDL, LDLDIRECT in the last 72 hours. Thyroid Function Tests:  Recent Labs  04/30/16 0556  TSH 2.516   Anemia Panel: No results for input(s): VITAMINB12, FOLATE, FERRITIN, TIBC, IRON, RETICCTPCT in the last 72 hours. Urine analysis:    Component Value Date/Time  COLORURINE YELLOW 04/30/2016 [place] 04/30/2016 0218   LABSPEC 1.015 04/30/2016 0218   PHURINE 5.5 04/30/2016 0218   GLUCOSEU 100 (A) 04/30/2016 0218   HGBUR SMALL (A) 04/30/2016 0218   BILIRUBINUR NEGATIVE 04/30/2016 0218   KETONESUR NEGATIVE 04/30/2016 0218   PROTEINUR NEGATIVE 04/30/2016 0218   UROBILINOGEN 0.2 01/11/2015 1850   NITRITE NEGATIVE 04/30/2016 0218   LEUKOCYTESUR NEGATIVE 04/30/2016 0218   Sepsis Labs: @LABRCNTIP (procalcitonin:4,lacticidven:4)  )No results found for this or any previous visit (from the past 240 hour(s)).    Radiology Studies: Dg Chest 2 View  Result Date: 04/30/2016 CLINICAL DATA:  Acute onset of cough and generalized weakness. Falls, with body aches. Initial encounter. EXAM: CHEST  2 VIEW COMPARISON:  Chest radiograph performed 12/27/2015 FINDINGS: The lungs are well-aerated. Mild vascular congestion is noted. Mild left basilar opacity may reflect mild pneumonia. There is no evidence of pleural effusion or pneumothorax. The heart is mildly enlarged. A pacemaker/AICD is noted overlying the left chest  wall, with leads ending overlying the right atrium, right ventricle and coronary sinus. No acute osseous abnormalities are seen. IMPRESSION: Mild vascular congestion and mild cardiomegaly. Mild left basilar opacity may reflect pneumonia. Electronically Signed   By: Garald Balding M.D.   On: 04/30/2016 01:13   Dg Pelvis 1-2 Views  Result Date: 04/30/2016 CLINICAL DATA:  Status post multiple falls. EXAM: PELVIS - 1-2 VIEW COMPARISON:  Pelvic radiograph 12/28/2015 FINDINGS: Left total hip arthroplasty without abnormal lucency surrounding the components. No periprosthetic fracture. Limited views of the right hip are normal. No visible pelvic fracture. IMPRESSION: 1. Left total hip arthroplasty without periprosthetic fracture. 2. No pelvic fracture. Electronically Signed   By: Ulyses Jarred M.D.   On: 04/30/2016 03:20   Dg Elbow Complete Right (3+view)  Result Date: 04/30/2016 CLINICAL DATA:  RIGHT olecranon pain for 2 days, no injury EXAM: RIGHT ELBOW - COMPLETE 3+ VIEW COMPARISON:  None FINDINGS: Mild diffuse osseous demineralization. Elbow flexed on all images. Small lateral epicondylar spur. Prominent olecranon spur with overlying soft tissue swelling. No acute fracture, dislocation or bone destruction. No definite joint effusion. Extensive atherosclerotic calcifications. IMPRESSION: Olecranon spur with overlying soft tissue swelling which could represent edema or an olecranon bursal collection which can result from hemorrhage, inflammation, or infection. Remainder of exam unremarkable. Electronically Signed   By: Lavonia Dana M.D.   On: 04/30/2016 12:21   Ct Head Wo Contrast  Result Date: 04/30/2016 CLINICAL DATA:  Status post fall. EXAM: CT HEAD WITHOUT CONTRAST TECHNIQUE: Contiguous axial images were obtained from the base of the skull through the vertex without intravenous contrast. COMPARISON:  Head CT 12/27/2015 FINDINGS: Brain: No mass lesion, intraparenchymal hemorrhage or extra-axial collection. No  evidence of acute cortical infarct. Hypoattenuation within the posterior right frontal lobe is unchanged. Mild periventricular hypoattenuation compatible with chronic microvascular disease is again seen. Unchanged mineralization in the brainstem. Vascular: Atherosclerotic calcification of the internal carotid artery is and vertebral arteries is present. Skull: Normal visualized skull base, calvarium and extracranial soft tissues. Sinuses/Orbits: No sinus fluid levels or advanced mucosal thickening. No mastoid effusion. Normal orbits. IMPRESSION: 1. No acute intracranial abnormality. 2. 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 Electronically Signed   By: Ulyses Jarred M.D.   On: 04/30/2016 03:08     Scheduled Meds: . atorvastatin  10 mg Oral Daily  . carvedilol  12.5 mg Oral BID WC  . ceFEPime (MAXIPIME) IV  2 g Intravenous BID  .  cholecalciferol   Oral Daily  . clopidogrel  75 mg Oral Q breakfast  . insulin detemir  25 Units Subcutaneous Daily  . insulin detemir  35 Units Subcutaneous QHS  . iron polysaccharides  150 mg Oral Daily  . multivitamin with minerals  1 tablet Oral Daily  . pantoprazole  40 mg Oral Daily  . pregabalin  75 mg Oral BID  . sacubitril-valsartan  1 tablet Oral BID  . senna-docusate  1 tablet Oral BID  . spironolactone  25 mg Oral Daily  . tamsulosin  0.4 mg Oral QPC supper  . vancomycin  750 mg Intravenous Q12H  . warfarin  7.5 mg Oral ONCE-1800  . Warfarin - Pharmacist Dosing Inpatient   Does not apply q1800   Continuous Infusions:    LOS: 0 days   Time Spent in minutes   30 minutes  Kenn Rekowski D.O. on 04/30/2016 at 1:26 PM  Between 7am to 7pm - Pager - [phone]  After 7pm go to [email or web address] - password [email or web address]  And look for the night coverage person covering for me after hours  [facility] Office  [phone]

## 2016-04-30 NOTE — ED Triage Notes (Signed)
Pt brought to ED by wife, pt and wife state he fell while getting out of bed this am. Wife reports pt unable to stand on own today, increased incontinence and increased diarrhea today. Pt c/o R shoulder and R elbow pain.

## 2016-04-30 NOTE — NC FL2 (Signed)
[place] LEVEL OF CARE SCREENING TOOL     IDENTIFICATION  Patient Name: [name] [name] Birthdate: [birth year] Sex: male Admission Date (Current Location): 04/30/2016  [facility] and [place] Number:  Herbalist and Address:  [facility],  [place] [address], [place]      Provider Number: [id number]  Attending Physician Name and Address:  Cristal Ford, DO  Relative Name and Phone Number:       Current Level of Care: Hospital Recommended Level of Care: [place] Prior Approval Number:    Date Approved/Denied:   PASRR Number: [id number]  Discharge Plan: SNF    Current Diagnoses: Patient Active Problem List   Diagnosis Date Noted  . HCAP (healthcare-associated pneumonia) 04/30/2016  . Weakness generalized 04/30/2016  . Status post hip hemiarthroplasty   . Urinary retention   . Dementia   . AKI (acute kidney injury) ([place])   . Thrombocytopenia ([place])   . Anemia of chronic disease   . Closed left hip fracture ([place]) 12/28/2015  . CKD (chronic kidney disease), stage III 12/28/2015  . Fall   . Diabetes mellitus with neuropathy ([place]) 01/10/2015  . Amputation of right lower extremity below knee ([place]) 01/08/2015  . Below knee amputation status ([place]) 01/03/2015  . Atrial fibrillation [I48.91] 11/01/2014  . Encounter for therapeutic drug monitoring 11/01/2014  . S/P bilateral BKA (below knee amputation) ([place]) 02/02/2014  . Osteomyelitis of right foot ([place]) 01/30/2014  . 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 ([place]) 01/04/2014  . Osteomyelitis of toe of right foot ([place]) 01/04/2014  . S/P Lt BKA 11/09/13 11/14/2013  . Acute blood loss anemia 11/11/2013  . Type II or unspecified type diabetes mellitus 11/09/2013  . Lower limb amputation, great toe ([place]) 10/11/2013  . Lower limb amputation, other toe(s) 10/11/2013  . Chronic osteomyelitis of toe of left foot ([place]) 10/04/2013  . Foot osteomyelitis, left ([place])  10/04/2013  . Uncontrolled pain, Lt toe 09/21/2013  . Gangrenous toe, Lt toe 09/21/2013  . PAD (peripheral artery disease) ([place]) 09/13/2013  . Diabetes mellitus ([place]) 09/13/2013  . Hyperlipidemia 09/13/2013  . PAF (paroxysmal atrial fibrillation) ([place]) 09/13/2013  . Critical lower limb ischemia- s/p Rt anterior tibial PTA 12/29/13 in preparation for Rt BKA 08/30/2013  . Generalized weakness 02/12/2013  . 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 HTN (hypertension) 04/08/2011  . NICM- EF 20-25% echo 6/14 09/29/2008  . LBBB 09/29/2008  . SYSTOLIC HEART FAILURE, CHRONIC 09/29/2008    Orientation RESPIRATION BLADDER Height & Weight     Self, Situation, Time, Place  Normal Continent Weight: 218 lb 1.6 oz (98.9 kg) (w/ prostetic legs on) Height:  5\' 9"  (175.3 cm)  BEHAVIORAL SYMPTOMS/MOOD NEUROLOGICAL BOWEL NUTRITION STATUS      Continent Diet (Heart Healthy/Carb Modified)  AMBULATORY STATUS COMMUNICATION OF NEEDS Skin   Extensive Assist Verbally Normal                       Personal Care Assistance Level of Assistance  Bathing, Feeding, Dressing Bathing Assistance: Limited assistance Feeding assistance: Independent Dressing Assistance: Limited assistance     Functional Limitations Info             SPECIAL CARE FACTORS FREQUENCY  PT (By licensed PT), OT (By licensed OT)     PT Frequency: 5 OT Frequency: 5            Contractures  Additional Factors Info  Code Status, Allergies Code Status Info: Fullcode Allergies Info: NKDA           Current Medications (04/30/2016):  This is the current hospital active medication list Current Facility-Administered Medications  Medication Dose Route Frequency Provider Last Rate Last Dose  . acetaminophen (TYLENOL) tablet 650 mg  650 mg Oral Q6H PRN Rise Patience, MD   650 mg at 04/30/16 [id number]   Or  . acetaminophen (TYLENOL) suppository 650 mg  650 mg Rectal Q6H PRN Rise Patience, MD      .  atorvastatin (LIPITOR) tablet 10 mg  10 mg Oral Daily Rise Patience, MD   10 mg at 04/30/16 0954  . carvedilol (COREG) tablet 12.5 mg  12.5 mg Oral BID WC Rise Patience, MD   12.5 mg at 04/30/16 0955  . ceFEPIme (MAXIPIME) 2 g in dextrose 5 % 50 mL IVPB  2 g Intravenous BID Rise Patience, MD 100 mL/hr at 04/30/16 0519 2 g at 04/30/16 0519  . cholecalciferol (VITAMIN D) tablet   Oral Daily Rise Patience, MD   1,000 Units at 04/30/16 [phone]  . clopidogrel (PLAVIX) tablet 75 mg  75 mg Oral Q breakfast Rise Patience, MD   75 mg at 04/30/16 0954  . insulin aspart (novoLOG) injection 0-5 Units  0-5 Units Subcutaneous QHS Maryann Mikhail, DO      . insulin aspart (novoLOG) injection 0-9 Units  0-9 Units Subcutaneous TID WC Maryann Mikhail, DO      . insulin detemir (LEVEMIR) injection 25 Units  25 Units Subcutaneous Daily Rise Patience, MD   25 Units at 04/30/16 [phone]  . insulin detemir (LEVEMIR) injection 35 Units  35 Units Subcutaneous QHS Rise Patience, MD      . iron polysaccharides (NIFEREX) capsule 150 mg  150 mg Oral Daily Rise Patience, MD   150 mg at 04/30/16 0955  . multivitamin with minerals tablet 1 tablet  1 tablet Oral Daily Rise Patience, MD   1 tablet at 04/30/16 0954  . ondansetron (ZOFRAN) tablet 4 mg  4 mg Oral Q6H PRN Rise Patience, MD       Or  . ondansetron [facility]) injection 4 mg  4 mg Intravenous Q6H PRN Rise Patience, MD      . pantoprazole (PROTONIX) EC tablet 40 mg  40 mg Oral Daily Rise Patience, MD   40 mg at 04/30/16 0955  . pregabalin (LYRICA) capsule 75 mg  75 mg Oral BID Rise Patience, MD   75 mg at 04/30/16 0954  . sacubitril-valsartan (ENTRESTO) 24-26 mg per tablet  1 tablet Oral BID Rise Patience, MD   1 tablet at 04/30/16 0955  . senna-docusate (Senokot-S) tablet 1 tablet  1 tablet Oral BID Rise Patience, MD   1 tablet at 04/30/16 0954  . spironolactone (ALDACTONE) tablet 25 mg  25  mg Oral Daily Rise Patience, MD   25 mg at 04/30/16 0955  . tamsulosin (FLOMAX) capsule 0.4 mg  0.4 mg Oral QPC supper Rise Patience, MD      . vancomycin (VANCOCIN) IVPB 750 mg/150 ml premix  750 mg Intravenous Q12H Rise Patience, MD      . warfarin (COUMADIN) tablet 7.5 mg  7.5 mg Oral ONCE-1800 Donald Prose Runyon, RPH      . Warfarin - Pharmacist Dosing Inpatient   Does not apply [place]  Discharge Medications: Please see discharge summary for a list of discharge medications.  Relevant Imaging Results:  Relevant Lab Results:   Additional Information SSN: [id number]  Standley Brooking, LCSW

## 2016-04-30 NOTE — ED Notes (Signed)
Reminded pt about the need of a urine sample. Urinal placed at bedside

## 2016-04-30 NOTE — Care Management Note (Signed)
Case Management Note  Patient Details  Name: CAELON KALLSEN MRN: [id number] Date of Birth: 1945-12-03  Subjective/Objective: 70 y/o m admitted w/Gen'l weakness.Bilateral leg prosthesis.R arm limited. From home. PT-recc SNF.CSW already following.                  Action/Plan:d/c SNF.   Expected Discharge Date:                  Expected Discharge Plan:  [place]  In-House Referral:     Discharge planning Services     Post Acute Care Choice:    Choice offered to:     DME Arranged:    DME Agency:     HH Arranged:    [place] Agency:     Status of Service:  In process, will continue to follow  If discussed at Long Length of Stay Meetings, dates discussed:    Additional Comments:  Dessa Phi, RN 04/30/2016, 2:07 PM

## 2016-04-30 NOTE — [place] Notes (Signed)
Inpatient Diabetes Program Recommendations  AACE/ADA: New Consensus Statement on Inpatient Glycemic Control (2015)  Target Ranges:  Prepandial:   less than 140 mg/dL      Peak postprandial:   less than 180 mg/dL (1-2 hours)      Critically ill patients:  140 - 180 mg/dL   Results for ADAHIR, [name] [name] (MRN [id number]) as of 04/30/2016 09:54  Ref. Range 04/30/2016 00:03 04/30/2016 07:37  Glucose-Capillary Latest Ref Range: 65 - 99 mg/dL 249 (H) 225 (H)    Admit with: Weakness  History: DM, CHF, CKD, Cardiomyopathy  Home DM Meds: Levemir 25 units AM/ 35 units PM  Current Insulin Orders: Levemir 25 units AM/ 35 units PM      MD- Please consider starting Novolog Sensitive Correction Scale/ SSI (0-9 units) TID AC + HS      --Will follow patient during hospitalization--  Wyn Quaker RN, MSN, CDE Diabetes Coordinator Inpatient Glycemic Control Team Team Pager: [phone] (8a-5p)

## 2016-04-30 NOTE — Progress Notes (Signed)
ANTICOAGULATION CONSULT NOTE - Follow Up Consult  Pharmacy Consult for warfarin Indication: atrial fibrillation  No Known Allergies  Patient Measurements: Height: 5\' 9"  (175.3 cm) Weight: 218 lb 1.6 oz (98.9 kg) (w/ prostetic legs on) IBW/kg (Calculated) : 70.7  Vital Signs: Temp: 98.8 F (37.1 C) (09/06 0535) Temp Source: Oral (09/06 0535) BP: 118/56 (09/06 0535) Pulse Rate: 85 (09/06 0535)  Labs:  Recent Labs  04/30/16 0047 04/30/16 0556  HGB 10.5* 9.9*  HCT 31.2* 30.2*  PLT 174 153  LABPROT  --  33.2*  INR  --  3.16  CREATININE 1.84* 1.77*  CKTOTAL  --  201  TROPONINI  --  <0.03    Estimated Creatinine Clearance: 43.8 mL/min (by C-G formula based on SCr of 1.77 mg/dL).   Assessment: 62 yoM on warfarin PTA for h/o atrial fibrillation admitted after fall at home.  Antibiotics started for possible pneumonia.  Pharmacy managing warfarin dosing inpatient.  Pt also on Plavix PTA.  PTA warfarin dose reported as 15 mg daily except takes 10 mg on MWF. LD 9/5 1800.  Today, 04/30/2016:  INR 3.16  Hgb 9.9, plts WNL  Goal of Therapy:  INR 2-3   Plan:  Warfarin 7.5 mg this evening. Daily INR.  Hershal Coria 04/30/2016,10:36 AM

## 2016-04-30 NOTE — Clinical Social Work Note (Signed)
Clinical Social Work Assessment  Patient Details  Name: [name] [name] MRN: [id number] Date of Birth: 1946/05/31  Date of referral:  04/30/16               Reason for consult:  Facility Placement                Permission sought to share information with:  Chartered certified accountant granted to share information::  Yes, Verbal Permission Granted  Name::        Agency::     Relationship::     Contact Information:     Housing/Transportation Living arrangements for the past 2 months:  Single Family Home Source of Information:  Patient Patient Interpreter Needed:  None Criminal Activity/Legal Involvement Pertinent to Current Situation/Hospitalization:  No - Comment as needed Significant Relationships:  Spouse Lives with:  Spouse Do you feel safe going back to the place where you live?  No Need for family participation in patient care:  Yes (Comment)  Care giving concerns:  CSW reviewed PT evaluation recommending SNF at discharge.    Social Worker assessment / plan:  CSW spoke with patient re: discharge planning - patient states that he has been to [place] SNF in the past & would be ok with going there if possible. CSW awaiting call back from [place] [facility] re: bed availability.   Employment status:  Retired Nurse, adult PT Recommendations:  [place] / Referral to community resources:  [place]  Patient/Family's Response to care:  Patient states that he was living at home with his wife prior to hospitalization but understands the need to go to SNF/Rehab as he is weak.   Patient/Family's Understanding of and Emotional Response to Diagnosis, Current Treatment, and Prognosis:  Patient states that he came into the hospital due to weakness and feels comfortable going to SNF to get increased PT.   Emotional Assessment Appearance:  Appears stated age Attitude/Demeanor/Rapport:     Affect (typically observed):    Orientation:  Oriented to Self, Oriented to Place, Oriented to  Time, Oriented to Situation Alcohol / Substance use:    Psych involvement (Current and /or in the community):     Discharge Needs  Concerns to be addressed:    Readmission within the last 30 days:    Current discharge risk:    Barriers to Discharge:      Standley Brooking, LCSW 04/30/2016, 2:48 PM

## 2016-04-30 NOTE — Evaluation (Signed)
Clinical/Bedside Swallow Evaluation Patient Details  Name: [name] [name] MRN: [id number] Date of Birth: 10/01/1945  Today's Date: 04/30/2016 Time: SLP Start Time (ACUTE ONLY): [id number] SLP Stop Time (ACUTE ONLY): 1521 SLP Time Calculation (min) (ACUTE ONLY): 36 min  Past Medical History:  Past Medical History:  Diagnosis Date  . Anemia    takes Iron pill daily  . Arthritis   . Automatic implantable cardioverter-defibrillator in situ    a. 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  . CAD (coronary artery disease)   . Cardiomyopathy, nonischemic ([place])    a. Cath 2003: mild nonobstructive CAD, EF 25% at that time.  . Chronic systolic CHF (congestive heart failure) (HCC)    takes Furosemide daily as well as Aldactone  . CKD (chronic kidney disease), stage III   . Complication of anesthesia   . Constipation    takes Sennokot daily  . Dementia   . GERD (gastroesophageal reflux disease)    takes Nexium daily  . Headache    occasionally  . High cholesterol    takes Atorvastatin daily  . History of blood transfusion    no abnormal reaction noted  . History of bronchitis    > 1 yr ago  . History of kidney stones   . Hypertension    takes Coreg daily  . LBBB (left bundle branch block)    takes Coumadin daily  . NSVT (nonsustained ventricular tachycardia) ([place])    a. Noted on ICD interrogation in 2011.  [name] PAD (peripheral artery disease) ([place])    a. 08/2013 Periph Angio/PTA: Abd Ao nl, RLE- 3v runoff, PT diff dzs, AT 90p, LLE 2v runoff, PT 100, AT 40m (diamondback ORA/chocolate balloon PTA).  [name] PAF (paroxysmal atrial fibrillation) ([place])    a. Noted on ICD interrogation 2012;  b. coumadin d/c'd 01/2013.  [name] PAF (paroxysmal atrial fibrillation) ([place])   . Peripheral neuropathy (HCC)    hands;numbness and tingling   . Pneumonia    hx of > 24yr ago  . PONV (postoperative nausea and vomiting)   . Type II diabetes mellitus ([place])    takes Levemir daily as well  as Novolog  . Urinary frequency   . Urinary urgency    Past Surgical History:  Past Surgical History:  Procedure Laterality Date  . ABDOMINAL ANGIOGRAM  09/12/2013   Procedure: ABDOMINAL ANGIOGRAM;  Surgeon: Lorretta Harp, MD;  Location[facility] CATH LAB;  Service: Cardiovascular;;  . AMPUTATION Left 10/04/2013   Procedure: LEFT GREAT TOE AND SECOND TOE AMPUTATION;  Surgeon: Marianna Payment, MD;  Location: [place];  Service: Orthopedics;  Laterality: Left;  . AMPUTATION Left 11/09/2013   Procedure: LEFT AMPUTATION BELOW KNEE;  Surgeon: Marianna Payment, MD;  Location: [place];  Service: Orthopedics;  Laterality: Left;  . AMPUTATION Right 01/04/2014   Procedure: Doristine Devoid second and third toe amputation;  Surgeon: Marianna Payment, MD;  Location: [place];  Service: Orthopedics;  Laterality: Right;  . AMPUTATION Right 01/30/2014   Procedure: RIGHT BELOW KNEE AMPUTATION;  Surgeon: Marianna Payment, MD;  Location: [place];  Service: Orthopedics;  Laterality: Right;  . ATHERECTOMY Right 12/29/2013   Procedure: ATHERECTOMY;  Surgeon: Lorretta Harp, MD;  Location: [facility] CATH LAB;  Service: Cardiovascular;  Laterality: Right;  Anterior Tibial Artery  . CARDIAC CATHETERIZATION  10/01/2001   THERE WAS GLOBAL HYPOKINESIS AND EF 25%. THERE APPEARED TO BE GLOBAL DECREASE IN WALL MOTION  . CARDIAC DEFIBRILLATOR PLACEMENT  06/2009  WITH GENERATOR REPLACED; BiV ICD  . CARDIOVASCULAR STRESS TEST  03/20/2009   EF 33%  . [place]  . COLONOSCOPY    . ESOPHAGOGASTRODUODENOSCOPY    . HIP ARTHROPLASTY Left 12/28/2015   Procedure: POSTERIOR  APPROACH HEMI HIP ARTHROPLASTY;  Surgeon: Leandrew Koyanagi, MD;  Location: [place];  Service: Orthopedics;  Laterality: Left;  . LEG AMPUTATION BELOW KNEE Left 11/09/2013   DR Erlinda Hong  . LITHOTRIPSY  2001  . LOWER EXTREMITY ANGIOGRAM Bilateral 09/12/2013   Procedure: LOWER EXTREMITY ANGIOGRAM;  Surgeon: Lorretta Harp, MD;  Location[facility] CATH LAB;  Service: Cardiovascular;   Laterality: Bilateral;  . LOWER EXTREMITY ANGIOGRAM N/A 12/29/2013   Procedure: LOWER EXTREMITY ANGIOGRAM;  Surgeon: Lorretta Harp, MD;  [facility];  Service: Cardiovascular;  Laterality: N/A;  . STUMP REVISION Left 01/03/2015   Procedure: LEFT BELOW KNEE AMPUTATION REVISION;  Surgeon: Leandrew Koyanagi, MD;  Location: [place];  Service: Orthopedics;  Laterality: Left;  . TOE AMPUTATION  10/04/2013   LEFT GREAT TOE AND 4TH TOE   /   DR Erlinda Hong  . TRANSLUMINAL ATHERECTOMY TIBIAL ARTERY Left 09/12/2013  . US ECHOCARDIOGRAPHY  03/21/2008   EF 30-35%   HPI:  70 yo malee adm to [facility] with weakness and fall.  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  Pt's wife reported to MD that pt has some choking episodes at home.  Pt on a PPI.     Assessment / Plan / Recommendation Clinical Impression  Pt presents with functional oropharyngeal swallow based on clinical swallow evaluation.  NO focal CN deficits noted and nearly empty meal tray on his table!     Pt's swallow was timely and voice was clear throughout intake without indication of oropharyngeal residuals.   Pt does endorse issues with choking on food approximately 3 times a week and senses lower pharyngeal residuals at times.  SLP suspects these symptoms are esophageal related due to h/o reflux.  Provided pt with dysphagia mitigation strategies in writing and verbally.    Also advised to heimlich manuever use and advised pt if increased problems noted with foods to impart this to his MD.  No SLP follow up needed.     Aspiration Risk  Mild aspiration risk    Diet Recommendation Regular;Thin liquid   Liquid Administration via: Cup;Straw Medication Administration: Whole meds with puree (pt takes medicine with applesauce or pudding at home per his statement) Supervision: Patient able to self feed Compensations: Slow rate;Small  sips/bites Postural Changes: Seated upright at 90 degrees;Remain upright for at least 30 minutes after po intake    Other  Recommendations Oral Care Recommendations: Oral care BID   Follow up Recommendations  None    Frequency and Duration            Prognosis        Swallow Study   General Date of Onset: 04/30/16 HPI: 70 yo malee adm to [facility] with weakness and fall.  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  Pt's wife reported to MD that pt has some choking episodes at home.  Pt on a PPI.   Type of Study: Bedside Swallow Evaluation Diet Prior to this  Study: Thin liquids;Regular Temperature Spikes Noted: No Respiratory Status: Nasal cannula History of Recent Intubation: No Behavior/Cognition: Alert;Cooperative;Pleasant mood Oral Cavity Assessment: Within Functional Limits Oral Care Completed by SLP: No Oral Cavity - Dentition: Adequate natural dentition Vision: Functional for self-feeding Self-Feeding Abilities: Able to feed self (using left arm due to right elbow pain) Patient Positioning: Upright in bed Baseline Vocal Quality: Low vocal intensity Volitional Cough: Weak Volitional Swallow: Able to elicit    Oral/Motor/Sensory Function Overall Oral Motor/Sensory Function: Within functional limits   Ice Chips Ice chips: Not tested   Thin Liquid Thin Liquid: Within functional limits Presentation: Self Fed;Straw    Nectar Thick Nectar Thick Liquid: Not tested   Honey Thick Honey Thick Liquid: Not tested   Puree Puree: Within functional limits Presentation: Self Fed;Spoon   Solid   GO   Solid: Within functional limits Presentation: Self Fed;Spoon Other Comments: angel food cake    Functional Assessment Tool Used: clinical judgement Functional Limitations: Swallowing Swallow Current Status [id number]): At least 1 percent but less than 20 percent  impaired, limited or restricted Swallow Goal Status [phone]): At least 1 percent but less than 20 percent impaired, limited or restricted Swallow Discharge Status [phone]): At least 1 percent but less than 20 percent impaired, limited or restricted   Luanna Salk, [facility] SLP [phone]

## 2016-04-30 NOTE — ED Provider Notes (Signed)
[facility] DEPT Provider Note   CSN: [id number] Arrival date & time: 04/29/16  2355  By signing my name below, I, [name] [name], attest that this documentation has been prepared under the direction and in the presence of physician practitioner, Deunta Beneke, MD. Electronically Signed: Dora [name], Scribe. 04/30/2016. 12:31 AM.  History   Chief Complaint Chief Complaint  Patient presents with  . Weakness    The history is provided by the patient. No language interpreter was used.  Weakness  Primary symptoms include no focal weakness, no loss of balance, no memory loss. This is a new problem. The current episode started 12 to 24 hours ago. The problem has not changed since onset.There was no focality noted. There has been no fever. Pertinent negatives include no shortness of breath, no chest pain, no vomiting, no altered mental status and no confusion. There were no medications administered prior to arrival. Associated medical issues do not include trauma or CVA.     HPI Comments: [name] [name] is a 70 y.o. male brought in by his wife who presents to the Emergency Department complaining of fall sustained around 830 AM yesterday morning. He reports he landed on his buttocks after his legs gave out on him while getting out of bed; he is a double prosthetic (lower extremities). Pt notes he had pain in his right shoulder before the fall that was exacerbated by the fall. He also reports generalized weakness, dry cough, mild abdominal pain, and bilateral elbow pain since the fall. He states he has been unsteady upon standing since the fall. Pt reports ongoing bowel incontinence which has worsened since the fall. He states he has used stool softeners recently but reports worsening diarrhea since the fall. Pt is on blood thinners. He reports his glucose levels have been good lately. He denies problems with his right shoulder in the past or h/o shoulder surgery. He denies recent antibiotics  use. Pt denies lightheadedness, vomiting, dysuria, or any other associated symptoms.  Past Medical History:  Diagnosis Date  . Anemia    takes Iron pill daily  . Arthritis   . Automatic implantable cardioverter-defibrillator in situ    a. 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  . CAD (coronary artery disease)   . Cardiomyopathy, nonischemic ([place])    a. Cath 2003: mild nonobstructive CAD, EF 25% at that time.  . Chronic systolic CHF (congestive heart failure) (HCC)    takes Furosemide daily as well as Aldactone  . CKD (chronic kidney disease), stage III   . Complication of anesthesia   . Constipation    takes Sennokot daily  . Dementia   . GERD (gastroesophageal reflux disease)    takes Nexium daily  . Headache    occasionally  . High cholesterol    takes Atorvastatin daily  . History of blood transfusion    no abnormal reaction noted  . History of bronchitis    > 1 yr ago  . History of kidney stones   . Hypertension    takes Coreg daily  . LBBB (left bundle branch block)    takes Coumadin daily  . NSVT (nonsustained ventricular tachycardia) ([place])    a. Noted on ICD interrogation in 2011.  [name] PAD (peripheral artery disease) ([place])    a. 08/2013 Periph Angio/PTA: Abd Ao nl, RLE- 3v runoff, PT diff dzs, AT 90p, LLE 2v runoff, PT 100, AT 41m (diamondback ORA/chocolate balloon PTA).  [name] PAF (paroxysmal atrial fibrillation) ([place])  a. Noted on ICD interrogation 2012;  b. coumadin d/c'd 01/2013.  [name] PAF (paroxysmal atrial fibrillation) ([place])   . Peripheral neuropathy (HCC)    hands;numbness and tingling   . Pneumonia    hx of > 87yr ago  . PONV (postoperative nausea and vomiting)   . Type II diabetes mellitus ([place])    takes Levemir daily as well as Novolog  . Urinary frequency   . Urinary urgency     Patient Active Problem List   Diagnosis Date Noted  . Status post hip hemiarthroplasty   . Urinary retention   . Dementia   . AKI (acute kidney  injury) ([place])   . Thrombocytopenia ([place])   . Anemia of chronic disease   . Closed left hip fracture ([place]) 12/28/2015  . CKD (chronic kidney disease), stage III 12/28/2015  . Fall   . Diabetes mellitus with neuropathy ([place]) 01/10/2015  . Amputation of right lower extremity below knee ([place]) 01/08/2015  . Below knee amputation status ([place]) 01/03/2015  . Atrial fibrillation [I48.91] 11/01/2014  . Encounter for therapeutic drug monitoring 11/01/2014  . S/P bilateral BKA (below knee amputation) ([place]) 02/02/2014  . Osteomyelitis of right foot ([place]) 01/30/2014  . 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 ([place]) 01/04/2014  . Osteomyelitis of toe of right foot ([place]) 01/04/2014  . S/P Lt BKA 11/09/13 11/14/2013  . Acute blood loss anemia 11/11/2013  . Type II or unspecified type diabetes mellitus 11/09/2013  . Lower limb amputation, great toe ([place]) 10/11/2013  . Lower limb amputation, other toe(s) 10/11/2013  . Chronic osteomyelitis of toe of left foot ([place]) 10/04/2013  . Foot osteomyelitis, left ([place]) 10/04/2013  . Uncontrolled pain, Lt toe 09/21/2013  . Gangrenous toe, Lt toe 09/21/2013  . PAD (peripheral artery disease) ([place]) 09/13/2013  . Diabetes mellitus ([place]) 09/13/2013  . Hyperlipidemia 09/13/2013  . PAF (paroxysmal atrial fibrillation) ([place]) 09/13/2013  . Critical lower limb ischemia- s/p Rt anterior tibial PTA 12/29/13 in preparation for Rt BKA 08/30/2013  . 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 HTN (hypertension) 04/08/2011  . NICM- EF 20-25% echo 6/14 09/29/2008  . LBBB 09/29/2008  . SYSTOLIC HEART FAILURE, CHRONIC 09/29/2008    Past Surgical History:  Procedure Laterality Date  . ABDOMINAL ANGIOGRAM  09/12/2013   Procedure: ABDOMINAL ANGIOGRAM;  Surgeon: Lorretta Harp, MD;  Location: [facility] CATH LAB;  Service: Cardiovascular;;  . AMPUTATION Left 10/04/2013   Procedure: LEFT GREAT TOE AND SECOND TOE AMPUTATION;  Surgeon: Marianna Payment, MD;  Location: [facility];   Service: Orthopedics;  Laterality: Left;  . AMPUTATION Left 11/09/2013   Procedure: LEFT AMPUTATION BELOW KNEE;  Surgeon: Marianna Payment, MD;  Location: [facility];  Service: Orthopedics;  Laterality: Left;  . AMPUTATION Right 01/04/2014   Procedure: Doristine Devoid second and third toe amputation;  Surgeon: Marianna Payment, MD;  Location: [facility];  Service: Orthopedics;  Laterality: Right;  . AMPUTATION Right 01/30/2014   Procedure: RIGHT BELOW KNEE AMPUTATION;  Surgeon: Marianna Payment, MD;  Location: [facility];  Service: Orthopedics;  Laterality: Right;  . ATHERECTOMY Right 12/29/2013   Procedure: ATHERECTOMY;  Surgeon: Lorretta Harp, MD;  Location: [facility] CATH LAB;  Service: Cardiovascular;  Laterality: Right;  Anterior Tibial Artery  . CARDIAC CATHETERIZATION  10/01/2001   THERE WAS GLOBAL HYPOKINESIS AND EF 25%. THERE APPEARED TO BE GLOBAL DECREASE IN WALL MOTION  . CARDIAC DEFIBRILLATOR PLACEMENT  06/2009   WITH GENERATOR REPLACED;  BiV ICD  . CARDIOVASCULAR STRESS TEST  03/20/2009   EF 33%  . [place]  . COLONOSCOPY    . ESOPHAGOGASTRODUODENOSCOPY    . HIP ARTHROPLASTY Left 12/28/2015   Procedure: POSTERIOR  APPROACH HEMI HIP ARTHROPLASTY;  Surgeon: Leandrew Koyanagi, MD;  Location: [facility];  Service: Orthopedics;  Laterality: Left;  . LEG AMPUTATION BELOW KNEE Left 11/09/2013   DR Erlinda Hong  . LITHOTRIPSY  2001  . LOWER EXTREMITY ANGIOGRAM Bilateral 09/12/2013   Procedure: LOWER EXTREMITY ANGIOGRAM;  Surgeon: Lorretta Harp, MD;  Location: [facility] CATH LAB;  Service: Cardiovascular;  Laterality: Bilateral;  . LOWER EXTREMITY ANGIOGRAM N/A 12/29/2013   Procedure: LOWER EXTREMITY ANGIOGRAM;  Surgeon: Lorretta Harp, MD;  Location[facility] LAB;  Service: Cardiovascular;  Laterality: N/A;  . STUMP REVISION Left 01/03/2015   Procedure: LEFT BELOW KNEE AMPUTATION REVISION;  Surgeon: Leandrew Koyanagi, MD;  Location: [facility];  Service: Orthopedics;  Laterality: Left;  . TOE AMPUTATION  10/04/2013   LEFT GREAT TOE  AND 4TH TOE   /   DR Erlinda Hong  . TRANSLUMINAL ATHERECTOMY TIBIAL ARTERY Left 09/12/2013  . US ECHOCARDIOGRAPHY  03/21/2008   EF 30-35%       Home Medications    Prior to Admission medications   Medication Sig Start Date End Date Taking? Authorizing Provider  atorvastatin (LIPITOR) 10 MG tablet Take 1 tablet (10 mg total) by mouth daily. 01/15/15  Yes Daniel J Angiulli, PA-C  carvedilol (COREG) 12.5 MG tablet Take 1 tablet (12.5 mg total) by mouth 2 (two) times daily with a meal. 01/15/15  Yes Daniel J Angiulli, PA-C  cholecalciferol (VITAMIN D) 1000 UNITS tablet Take 1 tablet (1,000 Units total) by mouth daily. 01/15/15  Yes Daniel J Angiulli, PA-C  Cholecalciferol (VITAMIN D3) 10000 units TABS Take 1 tablet by mouth daily.   Yes Historical Provider, MD  clopidogrel (PLAVIX) 75 MG tablet Take 1 tablet (75 mg total) by mouth daily with breakfast. 01/15/15  Yes Lavon Paganini Angiulli, PA-C  furosemide (LASIX) 40 MG tablet Take 1 tablet (40 mg total) by mouth daily. 01/02/16  Yes Theodis Blaze, MD  insulin detemir (LEVEMIR) 100 UNIT/ML injection 25 unit AM and 35 unit PM Patient taking differently: Inject 25-35 Units into the skin See admin instructions. 25 unit AM and 35 unit PM 01/02/16  Yes Theodis Blaze, MD  iron polysaccharides (NIFEREX) 150 MG capsule Take 1 capsule (150 mg total) by mouth daily. 01/15/15  Yes Daniel J Angiulli, PA-C  Multiple Vitamin (MULTIVITAMIN WITH MINERALS) TABS tablet Take 1 tablet by mouth daily.   Yes Historical Provider, MD  omeprazole (PRILOSEC) 20 MG capsule Take 20 mg by mouth daily.   Yes Historical Provider, MD  potassium chloride SA (K-DUR,KLOR-CON) 20 MEQ tablet Take 1 tablet (20 mEq total) by mouth daily. 01/02/16  Yes Theodis Blaze, MD  pregabalin (LYRICA) 75 MG capsule Take 1 capsule (75 mg total) by mouth 2 (two) times daily. 01/15/15  Yes Daniel J Angiulli, PA-C  sacubitril-valsartan (ENTRESTO) 24-26 MG Take 1 tablet by mouth 2 (two) times daily. 10/31/15  Yes Deboraha Sprang, MD  senna-docusate (SENOKOT-S) 8.6-50 MG per tablet Take 1 tablet by mouth 2 (two) times daily. 11/28/13  Yes Ivan Anchors Love, PA-C  spironolactone (ALDACTONE) 25 MG tablet Take 25 mg by mouth daily. TAKES HALF A TABLET 01/30/16  Yes Historical Provider, MD  tamsulosin (FLOMAX) 0.4 MG CAPS capsule Take 1 capsule (0.4  mg total) by mouth daily after supper. 01/02/16  Yes Theodis Blaze, MD  warfarin (COUMADIN) 10 MG tablet Take 10 mg by mouth daily. Take 10 mg Mon, Wed , Friday .  Take 15mg  on Sunday, tues, Thurs and Sat   Yes Historical Provider, MD    Family History Family History  Problem Relation Age of Onset  . Heart disease Mother   . Hypertension Mother   . Diabetes Mother   . Diabetes Father     Social History Social History  Substance Use Topics  . Smoking status: Never Smoker  . Smokeless tobacco: Never Used  . Alcohol use No     Allergies   Review of patient's allergies indicates no known allergies.   Review of Systems Review of Systems  Constitutional: Positive for fatigue. Negative for diaphoresis.  HENT: Negative for drooling and sore throat.   Eyes: Negative for photophobia.  Respiratory: Positive for cough. Negative for shortness of breath.   Cardiovascular: Negative for chest pain, palpitations and leg swelling.  Gastrointestinal: Positive for diarrhea. Negative for anal bleeding and vomiting.  Genitourinary: Positive for frequency. Negative for dysuria.  Musculoskeletal: Positive for arthralgias and myalgias.  Skin: Negative for rash.  Neurological: Positive for weakness (generalized). Negative for focal weakness, facial asymmetry, light-headedness and loss of balance.  Psychiatric/Behavioral: Negative for confusion and memory loss.  All other systems reviewed and are negative.   Physical Exam Updated Vital Signs BP 112/57 (BP Location: Right Arm)   Pulse 89   Temp 100.4 F (38 C) (Oral)   Resp 20   Ht 5\' 9"  (1.753 m)   Wt 209 lb (94.8 kg)   SpO2  93%   BMI 30.86 kg/m   Physical Exam  Constitutional: He appears well-developed and well-nourished.  HENT:  Head: Normocephalic.  Mouth/Throat: Oropharynx is clear and moist. No oropharyngeal exudate.  Eyes: Conjunctivae and EOM are normal. Pupils are equal, round, and reactive to light. Right eye exhibits no discharge. Left eye exhibits no discharge. No scleral icterus.  Neck: Normal range of motion. Neck supple. No JVD present. No tracheal deviation present.  Trachea is midline. No stridor or carotid bruits.  Cardiovascular: Normal rate, regular rhythm, normal heart sounds and intact distal pulses.   No murmur heard. Pulmonary/Chest: Effort normal and breath sounds normal. No stridor. No respiratory distress. He has no wheezes. He has no rales.  Lungs CTA bilaterally.  Abdominal: Soft. Bowel sounds are normal. He exhibits no distension and no mass. There is no tenderness. There is no rebound and no guarding.  Musculoskeletal: Normal range of motion. He exhibits no edema or tenderness.  Right elbow: no effusion. Right upper extremity:  intact biceps, no erythema or fluctuance. Right shoulder: joint is well seated, no effusion or crepitus. Left bicep tendons intact, no effusion, crepitus, warmth, erythema, or fluctuance. Joints well seated in left shoulder. 2+ reflexes LUE.  Lymphadenopathy:    He has no cervical adenopathy.  Neurological: He is alert. He has normal reflexes.  Skin: Skin is warm and dry. Capillary refill takes less than 2 seconds.  Psychiatric: He has a normal mood and affect. His behavior is normal.  Nursing note and vitals reviewed.   ED Treatments / Results  Labs (all labs ordered are listed, but only abnormal results are displayed) Labs Reviewed  CBG MONITORING, ED - Abnormal; Notable for the following:       Result Value   Glucose-Capillary 249 (*)    All other components within normal  limits  CULTURE, BLOOD (ROUTINE X 2)  CULTURE, BLOOD (ROUTINE X 2)    URINE CULTURE  COMPREHENSIVE METABOLIC PANEL  CBC WITH DIFFERENTIAL/PLATELET  URINALYSIS, ROUTINE W REFLEX MICROSCOPIC (NOT AT [facility])  I-STAT CG4 LACTIC ACID, ED  I-STAT TROPOININ, ED   Vitals:   04/30/16 0126 04/30/16 0130  BP: 121/72 128/67  Pulse: 81 81  Resp: 18 24  Temp:     Results for orders placed or performed during the hospital encounter of 04/30/16  Comprehensive metabolic panel  Result Value Ref Range   Sodium 136 135 - 145 mmol/L   Potassium 4.6 3.5 - 5.1 mmol/L   Chloride 105 101 - 111 mmol/L   CO2 23 22 - 32 mmol/L   Glucose, Bld 253 (H) 65 - 99 mg/dL   BUN 35 (H) 6 - 20 mg/dL   Creatinine, Ser 1.84 (H) 0.61 - 1.24 mg/dL   Calcium 8.5 (L) 8.9 - 10.3 mg/dL   Total Protein 7.1 6.5 - 8.1 g/dL   Albumin 3.4 (L) 3.5 - 5.0 g/dL   AST 18 15 - 41 U/L   ALT 16 (L) 17 - 63 U/L   Alkaline Phosphatase 54 38 - 126 U/L   Total Bilirubin 0.4 0.3 - 1.2 mg/dL   GFR calc non Af Amer 35 (L) >60 mL/min   GFR calc Af Amer 41 (L) >60 mL/min   Anion gap 8 5 - 15  CBC WITH DIFFERENTIAL  Result Value Ref Range   WBC 7.3 4.0 - 10.5 K/uL   RBC 4.30 4.22 - 5.81 MIL/uL   Hemoglobin 10.5 (L) 13.0 - 17.0 g/dL   HCT 31.2 (L) 39.0 - 52.0 %   MCV 72.6 (L) 78.0 - 100.0 fL   MCH 24.4 (L) 26.0 - 34.0 pg   MCHC 33.7 30.0 - 36.0 g/dL   RDW 18.2 (H) 11.5 - 15.5 %   Platelets 174 150 - 400 K/uL   Neutrophils Relative % 83 %   Neutro Abs 6.1 1.7 - 7.7 K/uL   Lymphocytes Relative 8 %   Lymphs Abs 0.6 (L) 0.7 - 4.0 K/uL   Monocytes Relative 8 %   Monocytes Absolute 0.6 0.1 - 1.0 K/uL   Eosinophils Relative 1 %   Eosinophils Absolute 0.0 0.0 - 0.7 K/uL   Basophils Relative 0 %   Basophils Absolute 0.0 0.0 - 0.1 K/uL  CBG monitoring, ED  Result Value Ref Range   Glucose-Capillary 249 (H) 65 - 99 mg/dL   Comment 1 Notify RN   I-Stat CG4 Lactic Acid, ED  (not at  [facility])  Result Value Ref Range   Lactic Acid, Venous 1.41 0.5 - 1.9 mmol/L  I-stat troponin, ED (not at [facility])  Result  Value Ref Range   Troponin i, poc 0.01 0.00 - 0.08 ng/mL   Comment 3           Dg Chest 2 View  Result Date: 04/30/2016 CLINICAL DATA:  Acute onset of cough and generalized weakness. Falls, with body aches. Initial encounter. EXAM: CHEST  2 VIEW COMPARISON:  Chest radiograph performed 12/27/2015 FINDINGS: The lungs are well-aerated. Mild vascular congestion is noted. Mild left basilar opacity may reflect mild pneumonia. There is no evidence of pleural effusion or pneumothorax. The heart is mildly enlarged. A pacemaker/AICD is noted overlying the left chest wall, with leads ending overlying the right atrium, right ventricle and coronary sinus. No acute osseous abnormalities are seen. IMPRESSION: Mild vascular congestion and mild cardiomegaly. Mild left  basilar opacity may reflect pneumonia. Electronically Signed   By: Garald Balding M.D.   On: 04/30/2016 01:13    EKG Interpretation  Date/Time:  Wednesday April 30 2016 00:53:14 EDT Ventricular Rate:  85 PR Interval:    QRS Duration: 153 QT Interval:  431 QTC Calculation: 513 R Axis:   141 Text Interpretation:  Sinus rhythm Right bundle branch block pacer spikes Confirmed by [facility]  MD, Arrie Zuercher ([id number]) on 04/30/2016 12:57:14 AM      Medications  vancomycin (VANCOCIN) IVPB 1000 mg/200 mL premix (1,000 mg Intravenous New Bag/Given 04/30/16 0155)  piperacillin-tazobactam (ZOSYN) IVPB 3.375 g (0 g Intravenous Stopped 04/30/16 0155)  sodium chloride 0.9 % bolus 500 mL (500 mLs Intravenous New Bag/Given 04/30/16 0155)    Radiology No results found.  Procedures Procedures (including critical care time)  DIAGNOSTIC STUDIES: Oxygen Saturation is 93% on RA, adequate by my interpretation.    COORDINATION OF CARE: 12:46 AM Discussed treatment plan with pt at bedside and pt agreed to plan.  Medications Ordered in ED Medications  piperacillin-tazobactam (ZOSYN) IVPB 3.375 g (not administered)  vancomycin (VANCOCIN) IVPB 1000 mg/200 mL premix  (not administered)     Initial Impression / Assessment and Plan / ED Course  I have reviewed the triage vital signs and the nursing notes.  Pertinent labs & imaging results that were available during my care of the patient were reviewed by me and considered in my medical decision making (see chart for details).  I personally performed the services described in this documentation, which was scribed in my presence. The recorded information has been reviewed and is accurate.     Final Clinical Impressions(s) / ED Diagnoses   Final diagnoses:  None    New Prescriptions New Prescriptions   No medications on file     Zaine Elsass, MD 04/30/16 [phone]

## 2016-04-30 NOTE — ED Notes (Signed)
Patient transported to CT 

## 2016-04-30 NOTE — Care Management Obs Status (Signed)
[facility] NOTIFICATION   Patient Details  Name: [name] [name] MRN: [id number] Date of Birth: 04-Oct-1945   Medicare Observation Status Notification Given:  Yes    MahabirJuliann Pulse, RN 04/30/2016, 2:05 PM

## 2016-04-30 NOTE — Progress Notes (Signed)
Pharmacy Antibiotic Note  [name] [name] is a 70 y.o. male admitted on 04/30/2016 with pneumonia.  Pharmacy has been consulted for Vancomycin, cefepime dosing.  Plan: Vancomycin 750mg  IV every 12 hours.  Goal trough 15-20 mcg/mL.  Cefepime 2gm iv q12hr  Height: 5\' 9"  (175.3 cm) Weight: 209 lb (94.8 kg) IBW/kg (Calculated) : 70.7  Temp (24hrs), Avg:100.4 F (38 C), Min:100.4 F (38 C), Max:100.4 F (38 C)   Recent Labs Lab 04/30/16 0047 04/30/16 0103 04/30/16 0412  WBC 7.3  --   --   CREATININE 1.84*  --   --   LATICACIDVEN  --  1.41 0.82    Estimated Creatinine Clearance: 41.2 mL/min (by C-G formula based on SCr of 1.84 mg/dL).    No Known Allergies  Antimicrobials this admission: Vancomycin 04/30/2016 >> Cefepime 04/30/2016 >>   Dose adjustments this admission: -  Microbiology results: pending  Thank you for allowing pharmacy to be a part of this patient's care.  Nani Skillern Crowford 04/30/2016 5:23 AM

## 2016-04-30 NOTE — ED Notes (Signed)
Hospitalist at bedside 

## 2016-04-30 NOTE — Evaluation (Signed)
Physical Therapy Evaluation Patient Details Name: [name] [name] MRN: [id number] DOB: Oct 13, 1945 Today's Date: 04/30/2016   History of Present Illness  [name] [name] is a 70 y.o. 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 Pt currently with functional limitations due to the deficits listed below (see PT Problem List).  Pt will benefit from skilled PT to increase their independence and safety with mobility to allow discharge to the venue listed below.   Pt assisted with sitting EOB and unable to don his prostheses due to R elbow pain.  Pt reported that MD mentioned an xray however did not see in orders.   RN was called and verified MD wanted xray of elbow so pt assisted back to bed.  Pt relies on RW for standing and gait however deferred this today until xray results available.  Recommend SNF at this time.     Follow Up Recommendations SNF;Supervision/Assistance - 24 hour    Equipment Recommendations  None recommended by PT    Recommendations for Other Services       Precautions / Restrictions Precautions Precautions: Fall Precaution Comments: bil BKA, has prostheses      Mobility  Bed Mobility Overal bed mobility: Needs Assistance;+2 for physical assistance Bed Mobility: Supine to Sit;Sit to Supine     Supine to sit: Max assist;HOB elevated Sit to supine: Total assist;+2 for physical assistance   General bed mobility comments: pt requiring increased assist for lower body, also unable to self assist with R UE due to pain requiring assist for trunk as well  Transfers                    Ambulation/Gait                Stairs             Wheelchair Mobility    Modified Rankin (Stroke Patients Only)       Balance Overall balance assessment: Needs assistance;History of Falls Sitting-balance support: No upper extremity supported;Feet unsupported Sitting balance-Leahy Scale: Fair                                       Pertinent Vitals/Pain Pain Assessment: 0-10 Pain Score: 9  Pain Location: R elbow Pain Descriptors / Indicators: Constant Pain Intervention(s): Limited activity within patient's tolerance;Monitored during session (RN aware)    Home Living Family/patient expects to be discharged to:: Private residence Living Arrangements: Spouse/significant other   Type of Home: House Home Access: Stairs to enter Entrance Stairs-Rails: Left Entrance Stairs-Number of Steps: 2 Home Layout: One level Home Equipment: Environmental consultant - 2 wheels;Wheelchair - manual;Tub bench;Bedside commode      Prior Function Level of Independence: Needs assistance   Gait / Transfers Assistance Needed: pt dons bilateral prostheses each day and ambulates with RW. W/c for community distances     Comments: uncertain of accuracy, hx of dementia     Hand Dominance        Extremity/Trunk Assessment   Upper Extremity Assessment: LUE deficits/detail       LUE Deficits / Details: unable to tolerate elbow flexion/extension due to  pain, swelling around elbow   Lower Extremity Assessment: Generalized weakness (hx bil BKAs)         Communication   Communication: No difficulties  Cognition Arousal/Alertness: Awake/alert Behavior During Therapy: WFL for tasks assessed/performed Overall Cognitive Status: History of cognitive impairments - at baseline (hx dementia, seems at baseline however no family present)                      General Comments      Exercises        Assessment/Plan    PT Assessment Patient needs continued PT services  PT Diagnosis Difficulty walking;Acute pain;Generalized weakness    PT Problem List Decreased strength;Decreased balance;Decreased mobility;Pain  PT Treatment Interventions DME instruction;Gait training;Functional mobility training;Balance training;Therapeutic exercise;Therapeutic activities;Patient/family education;Wheelchair mobility training   PT Goals (Current goals can be found in the Care Plan section) Acute Rehab PT Goals PT Goal Formulation: With patient Time For Goal Achievement: 05/14/16 Potential to Achieve Goals: Good    Frequency Min 3X/week   Barriers to discharge        Co-evaluation               End of Session   Activity Tolerance: Patient limited by pain Patient left: in bed;with call bell/phone within reach;with bed alarm set Nurse Communication: Mobility status    Functional Assessment Tool Used: clinical judgement Functional Limitation: Mobility: Walking and moving around Mobility: Walking and Moving Around Current Status [id number]): At least 80 percent but less than 100 percent impaired, limited or restricted Mobility: Walking and Moving Around Goal Status [phone]): At least 20 percent but less than 40 percent impaired, limited or restricted    Time: 1115-1140 PT Time Calculation (min) (ACUTE ONLY): 25 min   Charges:   PT Evaluation $PT Eval Moderate Complexity: 1 Procedure     PT G Codes:   PT G-Codes **NOT FOR INPATIENT CLASS** Functional Assessment Tool Used: clinical judgement Functional Limitation: Mobility: Walking and moving around Mobility: Walking and Moving Around Current Status [id number]): At least 80 percent but less than 100 percent impaired, limited or restricted Mobility: Walking and Moving Around Goal Status [phone]): At least 20 percent but less than 40 percent impaired, limited or restricted    [name] [name],[name] [name] 04/30/2016, 12:05 PM [name] [name], PT, DPT 04/30/2016 Pager: [phone]

## 2016-04-30 NOTE — H&P (Addendum)
History and Physical    [name] [name] [id number] DOB: [birth year] DOA: 04/30/2016  PCP: [name], MD  Patient coming from: Home.  Chief Complaint: Weakness.  HPI: [name] [name] is a 70 y.o. 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 Patient wife states that patient had a fall while trying to get out of the bed but denies losing consciousness. Since the fall patient is finding it difficult to walk. CT of the head and x-ray of the pelvis are unremarkable. Chest x-ray shows possible pneumonia with congestion. Patient was started on antibiotics for pneumonia and admitted for further management of weakness. On exam patient appears nonfocal but generally weak. As per patient wife patient has been only receiving Lasix 40 mg daily over the last 1 month since patient has been urinating more than usual. Patient is supposed to be on Lasix 40 mg twice daily.  ED Course: Patient was given 500 mL normal saline bolus in the ER and started on IV antibiotics.  Review of Systems: As per HPI, rest all negative.   Past Medical History:  Diagnosis Date  . Anemia    takes Iron pill daily  . Arthritis   . Automatic implantable cardioverter-defibrillator in situ    a. 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  . CAD (coronary artery disease)   . Cardiomyopathy, nonischemic ([place])    a. Cath 2003: mild nonobstructive CAD, EF 25% at that time.  . Chronic systolic CHF (congestive heart failure) (HCC)    takes Furosemide daily as well as Aldactone  . CKD (chronic kidney disease), stage III   . Complication of anesthesia   . Constipation    takes Sennokot daily  . Dementia   . GERD (gastroesophageal  reflux disease)    takes Nexium daily  . Headache    occasionally  . High cholesterol    takes Atorvastatin daily  . History of blood transfusion    no abnormal reaction noted  . History of bronchitis    > 1 yr ago  . History of kidney stones   . Hypertension    takes Coreg daily  . LBBB (left bundle branch block)    takes Coumadin daily  . NSVT (nonsustained ventricular tachycardia) ([place])    a. Noted on ICD interrogation in 2011.  [name] PAD (peripheral artery disease) ([place])    a. 08/2013 Periph Angio/PTA: Abd Ao nl, RLE- 3v runoff, PT diff dzs, AT 90p, LLE 2v runoff, PT 100, AT 48m (diamondback ORA/chocolate balloon PTA).  [name] PAF (paroxysmal atrial fibrillation) ([place])    a. Noted on ICD interrogation 2012;  b. coumadin d/c'd 01/2013.  [name] PAF (paroxysmal atrial fibrillation) ([place])   . Peripheral neuropathy (HCC)    hands;numbness and tingling   . Pneumonia    hx of > 70yr ago  . PONV (postoperative nausea and vomiting)   . Type II diabetes mellitus ([place])    takes Levemir daily as well as Novolog  . Urinary frequency   . Urinary urgency     Past Surgical History:  Procedure Laterality Date  . ABDOMINAL ANGIOGRAM  09/12/2013   Procedure: ABDOMINAL ANGIOGRAM;  Surgeon: Lorretta Harp, MD;  Location: [facility] CATH LAB;  Service: Cardiovascular;;  . AMPUTATION Left 10/04/2013  Procedure: LEFT GREAT TOE AND SECOND TOE AMPUTATION;  Surgeon: Marianna Payment, MD;  Location: [place];  Service: Orthopedics;  Laterality: Left;  . AMPUTATION Left 11/09/2013   Procedure: LEFT AMPUTATION BELOW KNEE;  Surgeon: Marianna Payment, MD;  Location: [place];  Service: Orthopedics;  Laterality: Left;  . AMPUTATION Right 01/04/2014   Procedure: Doristine Devoid second and third toe amputation;  Surgeon: Marianna Payment, MD;  Location: [place];  Service: Orthopedics;  Laterality: Right;  . AMPUTATION Right 01/30/2014   Procedure: RIGHT BELOW KNEE AMPUTATION;  Surgeon: Marianna Payment, MD;  Location: [place];  Service:  Orthopedics;  Laterality: Right;  . ATHERECTOMY Right 12/29/2013   Procedure: ATHERECTOMY;  Surgeon: Lorretta Harp, MD;  Location: [facility] CATH LAB;  Service: Cardiovascular;  Laterality: Right;  Anterior Tibial Artery  . CARDIAC CATHETERIZATION  10/01/2001   THERE WAS GLOBAL HYPOKINESIS AND EF 25%. THERE APPEARED TO BE GLOBAL DECREASE IN WALL MOTION  . CARDIAC DEFIBRILLATOR PLACEMENT  06/2009   WITH GENERATOR REPLACED; BiV ICD  . CARDIOVASCULAR STRESS TEST  03/20/2009   EF 33%  . [place]  . COLONOSCOPY    . ESOPHAGOGASTRODUODENOSCOPY    . HIP ARTHROPLASTY Left 12/28/2015   Procedure: POSTERIOR  APPROACH HEMI HIP ARTHROPLASTY;  Surgeon: Leandrew Koyanagi, MD;  Location: [place];  Service: Orthopedics;  Laterality: Left;  . LEG AMPUTATION BELOW KNEE Left 11/09/2013   DR Erlinda Hong  . LITHOTRIPSY  2001  . LOWER EXTREMITY ANGIOGRAM Bilateral 09/12/2013   Procedure: LOWER EXTREMITY ANGIOGRAM;  Surgeon: Lorretta Harp, MD;  Location[facility] LAB;  Service: Cardiovascular;  Laterality: Bilateral;  . LOWER EXTREMITY ANGIOGRAM N/A 12/29/2013   Procedure: LOWER EXTREMITY ANGIOGRAM;  Surgeon: Lorretta Harp, MD;  Location: [facility] CATH LAB;  Service: Cardiovascular;  Laterality: N/A;  . STUMP REVISION Left 01/03/2015   Procedure: LEFT BELOW KNEE AMPUTATION REVISION;  Surgeon: Leandrew Koyanagi, MD;  Location: [place];  Service: Orthopedics;  Laterality: Left;  . TOE AMPUTATION  10/04/2013   LEFT GREAT TOE AND 4TH TOE   /   DR Erlinda Hong  . TRANSLUMINAL ATHERECTOMY TIBIAL ARTERY Left 09/12/2013  . US ECHOCARDIOGRAPHY  03/21/2008   EF 30-35%     reports that he has never smoked. He has never used smokeless tobacco. He reports that he does not drink alcohol or use drugs.  No Known Allergies  Family History  Problem Relation Age of Onset  . Heart disease Mother   . Hypertension Mother   . Diabetes Mother   . Diabetes Father     Prior to Admission medications   Medication Sig Start Date End Date Taking?  Authorizing Provider  atorvastatin (LIPITOR) 10 MG tablet Take 1 tablet (10 mg total) by mouth daily. 01/15/15  Yes Daniel J Angiulli, PA-C  carvedilol (COREG) 12.5 MG tablet Take 1 tablet (12.5 mg total) by mouth 2 (two) times daily with a meal. 01/15/15  Yes Daniel J Angiulli, PA-C  cholecalciferol (VITAMIN D) 1000 UNITS tablet Take 1 tablet (1,000 Units total) by mouth daily. 01/15/15  Yes Daniel J Angiulli, PA-C  Cholecalciferol (VITAMIN D3) 10000 units TABS Take 1 tablet by mouth daily.   Yes Historical Provider, MD  clopidogrel (PLAVIX) 75 MG tablet Take 1 tablet (75 mg total) by mouth daily with breakfast. 01/15/15  Yes Lavon Paganini Angiulli, PA-C  furosemide (LASIX) 40 MG tablet Take 1 tablet (40 mg total) by mouth daily. 01/02/16  Yes Angeline Slim  Doyle Askew, MD  insulin detemir (LEVEMIR) 100 UNIT/ML injection 25 unit AM and 35 unit PM Patient taking differently: Inject 25-35 Units into the skin See admin instructions. 25 unit AM and 35 unit PM 01/02/16  Yes Theodis Blaze, MD  iron polysaccharides (NIFEREX) 150 MG capsule Take 1 capsule (150 mg total) by mouth daily. 01/15/15  Yes Daniel J Angiulli, PA-C  Multiple Vitamin (MULTIVITAMIN WITH MINERALS) TABS tablet Take 1 tablet by mouth daily.   Yes Historical Provider, MD  omeprazole (PRILOSEC) 20 MG capsule Take 20 mg by mouth daily.   Yes Historical Provider, MD  potassium chloride SA (K-DUR,KLOR-CON) 20 MEQ tablet Take 1 tablet (20 mEq total) by mouth daily. 01/02/16  Yes Theodis Blaze, MD  pregabalin (LYRICA) 75 MG capsule Take 1 capsule (75 mg total) by mouth 2 (two) times daily. 01/15/15  Yes Daniel J Angiulli, PA-C  sacubitril-valsartan (ENTRESTO) 24-26 MG Take 1 tablet by mouth 2 (two) times daily. 10/31/15  Yes Deboraha Sprang, MD  senna-docusate (SENOKOT-S) 8.6-50 MG per tablet Take 1 tablet by mouth 2 (two) times daily. 11/28/13  Yes Ivan Anchors Love, PA-C  spironolactone (ALDACTONE) 25 MG tablet Take 25 mg by mouth daily. TAKES HALF A TABLET 01/30/16  Yes  Historical Provider, MD  tamsulosin (FLOMAX) 0.4 MG CAPS capsule Take 1 capsule (0.4 mg total) by mouth daily after supper. 01/02/16  Yes Theodis Blaze, MD  warfarin (COUMADIN) 10 MG tablet Take 10 mg by mouth daily. Take 10 mg Mon, Wed , Friday .  Take 15mg  on Sunday, tues, Thurs and Sat   Yes Historical Provider, MD    Physical Exam: Vitals:   04/30/16 0200 04/30/16 0219 04/30/16 0317 04/30/16 0400  BP: 112/66 112/66 109/65 113/63  Pulse: 84 89 83 85  Resp: 22 22 19 22   Temp:      TempSrc:      SpO2: 94% 99% 92% 95%  Weight:      Height:          Constitutional: Not in distress. Vitals:   04/30/16 0200 04/30/16 0219 04/30/16 0317 04/30/16 0400  BP: 112/66 112/66 109/65 113/63  Pulse: 84 89 83 85  Resp: 22 22 19 22   Temp:      TempSrc:      SpO2: 94% 99% 92% 95%  Weight:      Height:       Eyes: Anicteric no pallor. ENMT: No discharge from the ears eyes nose or mouth. Neck: No mass felt. No JVD appreciated. Respiratory: No rhonchi or crepitations. Cardiovascular: S1-S2 heard. Abdomen: Soft nontender bowel sounds present. No guarding or rigidity. Musculoskeletal: No edema. Skin: No rash. Neurologic: Alert awake oriented to time place and person. Moves all extremities. Psychiatric: Appears normal.   Labs on Admission: I have personally reviewed following labs and imaging studies  CBC:  Recent Labs Lab 04/30/16 0047  WBC 7.3  NEUTROABS 6.1  HGB 10.5*  HCT 31.2*  MCV 72.6*  PLT [id number]   Basic Metabolic Panel:  Recent Labs Lab 04/30/16 0047  NA 136  K 4.6  CL 105  CO2 23  GLUCOSE 253*  BUN 35*  CREATININE 1.84*  CALCIUM 8.5*   GFR: Estimated Creatinine Clearance: 41.2 mL/min (by C-G formula based on SCr of 1.84 mg/dL). Liver Function Tests:  Recent Labs Lab 04/30/16 0047  AST 18  ALT 16*  ALKPHOS 54  BILITOT 0.4  PROT 7.1  ALBUMIN 3.4*   No results for input(s): LIPASE, AMYLASE  in the last 168 hours. No results for input(s): AMMONIA in  the last 168 hours. Coagulation Profile: No results for input(s): INR, PROTIME in the last 168 hours. Cardiac Enzymes: No results for input(s): CKTOTAL, CKMB, CKMBINDEX, TROPONINI in the last 168 hours. BNP (last 3 results) No results for input(s): PROBNP in the last 8760 hours. HbA1C: No results for input(s): HGBA1C in the last 72 hours. CBG:  Recent Labs Lab 04/30/16 0003  GLUCAP 249*   Lipid Profile: No results for input(s): CHOL, HDL, LDLCALC, TRIG, CHOLHDL, LDLDIRECT in the last 72 hours. Thyroid Function Tests: No results for input(s): TSH, T4TOTAL, FREET4, T3FREE, THYROIDAB in the last 72 hours. Anemia Panel: No results for input(s): VITAMINB12, FOLATE, FERRITIN, TIBC, IRON, RETICCTPCT in the last 72 hours. Urine analysis:    Component Value Date/Time   COLORURINE YELLOW 04/30/2016 0218   APPEARANCEUR CLEAR 04/30/2016 0218   LABSPEC 1.015 04/30/2016 0218   PHURINE 5.5 04/30/2016 0218   GLUCOSEU 100 (A) 04/30/2016 0218   HGBUR SMALL (A) 04/30/2016 0218   BILIRUBINUR NEGATIVE 04/30/2016 0218   KETONESUR NEGATIVE 04/30/2016 0218   PROTEINUR NEGATIVE 04/30/2016 0218   UROBILINOGEN 0.2 01/11/2015 1850   NITRITE NEGATIVE 04/30/2016 0218   LEUKOCYTESUR NEGATIVE 04/30/2016 0218   Sepsis Labs: @LABRCNTIP (procalcitonin:4,lacticidven:4) )No results found for this or any previous visit (from the past 240 hour(s)).   Radiological Exams on Admission: Dg Chest 2 View  Result Date: 04/30/2016 CLINICAL DATA:  Acute onset of cough and generalized weakness. Falls, with body aches. Initial encounter. EXAM: CHEST  2 VIEW COMPARISON:  Chest radiograph performed 12/27/2015 FINDINGS: The lungs are well-aerated. Mild vascular congestion is noted. Mild left basilar opacity may reflect mild pneumonia. There is no evidence of pleural effusion or pneumothorax. The heart is mildly enlarged. A pacemaker/AICD is noted overlying the left chest wall, with leads ending overlying the right atrium,  right ventricle and coronary sinus. No acute osseous abnormalities are seen. IMPRESSION: Mild vascular congestion and mild cardiomegaly. Mild left basilar opacity may reflect pneumonia. Electronically Signed   By: Garald Balding M.D.   On: 04/30/2016 01:13   Dg Pelvis 1-2 Views  Result Date: 04/30/2016 CLINICAL DATA:  Status post multiple falls. EXAM: PELVIS - 1-2 VIEW COMPARISON:  Pelvic radiograph 12/28/2015 FINDINGS: Left total hip arthroplasty without abnormal lucency surrounding the components. No periprosthetic fracture. Limited views of the right hip are normal. No visible pelvic fracture. IMPRESSION: 1. Left total hip arthroplasty without periprosthetic fracture. 2. No pelvic fracture. Electronically Signed   By: Ulyses Jarred M.D.   On: 04/30/2016 03:20   Ct Head Wo Contrast  Result Date: 04/30/2016 CLINICAL DATA:  Status post fall. EXAM: CT HEAD WITHOUT CONTRAST TECHNIQUE: Contiguous axial images were obtained from the base of the skull through the vertex without intravenous contrast. COMPARISON:  Head CT 12/27/2015 FINDINGS: Brain: No mass lesion, intraparenchymal hemorrhage or extra-axial collection. No evidence of acute cortical infarct. Hypoattenuation within the posterior right frontal lobe is unchanged. Mild periventricular hypoattenuation compatible with chronic microvascular disease is again seen. Unchanged mineralization in the brainstem. Vascular: Atherosclerotic calcification of the internal carotid artery is and vertebral arteries is present. Skull: Normal visualized skull base, calvarium and extracranial soft tissues. Sinuses/Orbits: No sinus fluid levels or advanced mucosal thickening. No mastoid effusion. Normal orbits. IMPRESSION: 1. No acute intracranial abnormality. 2. 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 Electronically Signed   By: Ulyses Jarred M.D.   On: 04/30/2016 03:08  EKG: Independently reviewed. Normal sinus rhythm with  RBBB.  Assessment/Plan Principal Problem:   Generalized weakness Active Problems:   NICM- EF 20-25% echo [id number]   SYSTOLIC HEART FAILURE, CHRONIC   HTN (hypertension)   PAF (paroxysmal atrial fibrillation) (HCC)   S/P bilateral BKA (below knee amputation) ([place])   Diabetes mellitus with neuropathy ([place])   HCAP (healthcare-associated pneumonia)   Weakness generalized    1. Generalized weakness - probably from deconditioning. Will hold off Lasix for now check CK levels cortisol levels TSH and cycle cardiac markers. Get physical therapy consult. Patient may need rehabilitation. 2. Possible pneumonia - patient has been placed on empiric antibiotics for healthcare associated pneumonia. Patient's wife states that patient has been having choking episodes recently for which I have requested speech therapy's evaluation. 3. Paroxysmal atrial fibrillation presently in sinus rhythm and rate controlled - continue Coreg and Coumadin per pharmacy. 4. Nonischemic cardiomyopathy last EF measured in 2014 was 20-25% status post defibrillator placement - Lasix on hold secondary to #1. Patient is on spironolactone and Enteresto which may need to be held if patient is orthostatic. 5. Diabetes mellitus type 2 - continue insulin and closely follow metabolic panel. 6. Chronic anemia - follow CBC. 7. Hyperlipidemia on statins - check CK level since patient has weakness. 8. Chronic kidney disease stage III - creatinine appears to be at baseline.   DVT prophylaxis: Coumadin. Code Status: Full code.  Family Communication: Patient's wife.  Disposition Plan: To be determined.  Consults called: Physical therapy.  Appendectomy. Admission status: Observation.    Rise Patience MD [facility] Pager [phone].  If 7PM-7AM, please contact night-coverage [email or web address] Password [email or web address]  04/30/2016, 4:26 AM

## 2016-05-01 DIAGNOSIS — J189 Pneumonia, unspecified organism: Secondary | ICD-10-CM

## 2016-05-01 DIAGNOSIS — Z89511 Acquired absence of right leg below knee: Secondary | ICD-10-CM

## 2016-05-01 DIAGNOSIS — I429 Cardiomyopathy, unspecified: Secondary | ICD-10-CM

## 2016-05-01 DIAGNOSIS — E1140 Type 2 diabetes mellitus with diabetic neuropathy, unspecified: Secondary | ICD-10-CM | POA: Diagnosis not present

## 2016-05-01 DIAGNOSIS — I10 Essential (primary) hypertension: Secondary | ICD-10-CM

## 2016-05-01 DIAGNOSIS — Z89512 Acquired absence of left leg below knee: Secondary | ICD-10-CM

## 2016-05-01 DIAGNOSIS — I5022 Chronic systolic (congestive) heart failure: Secondary | ICD-10-CM

## 2016-05-01 DIAGNOSIS — I480 Paroxysmal atrial fibrillation: Secondary | ICD-10-CM

## 2016-05-01 DIAGNOSIS — R531 Weakness: Secondary | ICD-10-CM | POA: Diagnosis not present

## 2016-05-01 DIAGNOSIS — Z794 Long term (current) use of insulin: Secondary | ICD-10-CM

## 2016-05-01 LAB — BASIC METABOLIC PANEL
ANION GAP: 7 (ref 5–15)
BUN: 30 mg/dL — ABNORMAL HIGH (ref 6–20)
CO2: 23 mmol/L (ref 22–32)
Calcium: 8.4 mg/dL — ABNORMAL LOW (ref 8.9–10.3)
Chloride: 108 mmol/L (ref 101–111)
Creatinine, Ser: 1.58 mg/dL — ABNORMAL HIGH (ref 0.61–1.24)
GFR calc Af Amer: 49 mL/min — ABNORMAL LOW (ref >60)
GFR, EST NON AFRICAN AMERICAN: 42 mL/min — AB (ref >60)
GLUCOSE: 191 mg/dL — AB (ref 65–99)
POTASSIUM: 3.9 mmol/L (ref 3.5–5.1)
Sodium: 138 mmol/L (ref 135–145)

## 2016-05-01 LAB — CBC
HCT: 31.4 % — ABNORMAL LOW (ref 39.0–52.0)
Hemoglobin: 10.2 g/dL — ABNORMAL LOW (ref 13.0–17.0)
MCH: 23.9 pg — ABNORMAL LOW (ref 26.0–34.0)
MCHC: 32.5 g/dL (ref 30.0–36.0)
MCV: 73.5 fL — AB (ref 78.0–100.0)
PLATELETS: 146 10*3/uL — AB (ref 150–400)
RBC: 4.27 MIL/uL (ref 4.22–5.81)
RDW: 18.4 % — ABNORMAL HIGH (ref 11.5–15.5)
WBC: 5.2 10*3/uL (ref 4.0–10.5)

## 2016-05-01 LAB — GLUCOSE, CAPILLARY
Glucose-Capillary: 161 mg/dL — ABNORMAL HIGH (ref 65–99)
Glucose-Capillary: 194 mg/dL — ABNORMAL HIGH (ref 65–99)
Glucose-Capillary: 218 mg/dL — ABNORMAL HIGH (ref 65–99)
Glucose-Capillary: 317 mg/dL — ABNORMAL HIGH (ref 65–99)

## 2016-05-01 LAB — URINE CULTURE: CULTURE: NO GROWTH

## 2016-05-01 LAB — PROTIME-INR
INR: 3.07
Prothrombin Time: 32.3 seconds — ABNORMAL HIGH (ref 11.4–15.2)

## 2016-05-01 LAB — URIC ACID: Uric Acid, Serum: 10 mg/dL — ABNORMAL HIGH (ref 4.4–7.6)

## 2016-05-01 MED ORDER — WARFARIN SODIUM 5 MG PO TABS
7.5000 mg | ORAL_TABLET | Freq: Once | ORAL | Status: AC
  Administered 2016-05-01: 7.5 mg via ORAL
  Filled 2016-05-01: qty 1

## 2016-05-01 NOTE — Progress Notes (Signed)
Attempted orthostatic VS. Patient unable to stand and right leg prosthesis would not fit properly. Repositioned pt in the bed. Notified MD.

## 2016-05-01 NOTE — Progress Notes (Signed)
ANTICOAGULATION CONSULT NOTE - Follow Up Consult  Pharmacy Consult for warfarin Indication: atrial fibrillation  No Known Allergies  Patient Measurements: Height: 5\' 9"  (175.3 cm) Weight: 208 lb 1.6 oz (94.4 kg) IBW/kg (Calculated) : 70.7  Vital Signs: Temp: 99.2 F (37.3 C) (09/07 0657) Temp Source: Oral (09/07 0657) BP: 111/58 (09/07 0823) Pulse Rate: 87 (09/07 0823)  Labs:  Recent Labs  04/30/16 0047 04/30/16 0556 04/30/16 1153 04/30/16 1733 05/01/16 0509  HGB 10.5* 9.9*  --   --  10.2*  HCT 31.2* 30.2*  --   --  31.4*  PLT 174 153  --   --  146*  LABPROT  --  33.2*  --   --  32.3*  INR  --  3.16  --   --  3.07  CREATININE 1.84* 1.77*  --   --  1.58*  CKTOTAL  --  201  --   --   --   TROPONINI  --  <0.03 <0.03 <0.03  --     Estimated Creatinine Clearance: 47.9 mL/min (by C-G formula based on SCr of 1.58 mg/dL).   Assessment: 51 yoM on warfarin PTA for h/o atrial fibrillation admitted after fall at home.  Antibiotics started for possible pneumonia.  Pharmacy managing warfarin dosing inpatient.  Pt also on Plavix PTA.  PTA warfarin dose reported as 15 mg daily except takes 10 mg on MWF. LD 9/5 1800.  Today, 05/01/2016:  INR 3.07, elevated but trending down.   Hgb low/stable, plts stable.   No major drug interactions   Goal of Therapy:  INR 2-3   Plan:  Warfarin 7.5 mg this evening. Daily INR.  Hershal Coria 05/01/2016,10:21 AM

## 2016-05-01 NOTE — Consult Note (Signed)
[name] [name] is an 70 y.o. male.   Chief Complaint: right elbow pain HPI: 70 yo rhd male states he has been having right elbow pain ~ 1 week.  He had a fall, but says he didn't land on his elbow.  No previous issues with elbow.  He describes a throbbing pain of 8/10 severity that is aggravated and alleviated by nothing.  He has had no fevers, chills, or night sweats.  Note from [facility], DO from 05/01/2016 reviewed. Xrays viewed and interpreted by me: AP and lateral views right elbow show traction spur with fracture.  Soft tissue swelling posteriorly.  No other fracture, dislocation, radioopaque foreign body. Labs reviewed: WBC 5.2.  Allergies: No Known Allergies  Past Medical History:  Diagnosis Date  . Anemia    takes Iron pill daily  . Arthritis   . Automatic implantable cardioverter-defibrillator in situ    a. 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  . CAD (coronary artery disease)   . Cardiomyopathy, nonischemic ([place])    a. Cath 2003: mild nonobstructive CAD, EF 25% at that time.  . Chronic systolic CHF (congestive heart failure) (HCC)    takes Furosemide daily as well as Aldactone  . CKD (chronic kidney disease), stage III   . Complication of anesthesia   . Constipation    takes Sennokot daily  . Dementia   . GERD (gastroesophageal reflux disease)    takes Nexium daily  . Headache    occasionally  . High cholesterol    takes Atorvastatin daily  . History of blood transfusion    no abnormal reaction noted  . History of bronchitis    > 1 yr ago  . History of kidney stones   . Hypertension    takes Coreg daily  . LBBB (left bundle branch block)    takes Coumadin daily  . NSVT (nonsustained ventricular tachycardia) ([place])    a. Noted on ICD interrogation in 2011.  [name] PAD (peripheral artery disease) ([place])    a. 08/2013 Periph Angio/PTA: Abd Ao nl, RLE- 3v runoff, PT diff dzs, AT 90p, LLE 2v runoff, PT 100, AT 53m(diamondback  ORA/chocolate balloon PTA).  .Marland KitchenPAF (paroxysmal atrial fibrillation) ([place]    a. Noted on ICD interrogation 2012;  b. coumadin d/c'd 01/2013.  .Marland KitchenPAF (paroxysmal atrial fibrillation) ([place]   . Peripheral neuropathy (HCC)    hands;numbness and tingling   . Pneumonia    hx of > 89yrgo  . PONV (postoperative nausea and vomiting)   . Type II diabetes mellitus ([place]   takes Levemir daily as well as Novolog  . Urinary frequency   . Urinary urgency     Past Surgical History:  Procedure Laterality Date  . ABDOMINAL ANGIOGRAM  09/12/2013   Procedure: ABDOMINAL ANGIOGRAM;  Surgeon: JoLorretta HarpMD;  [facility];  Service: Cardiovascular;;  . AMPUTATION Left 10/04/2013   Procedure: LEFT GREAT TOE AND SECOND TOE AMPUTATION;  Surgeon: NaMarianna PaymentMD;  Location: [facility] Service: Orthopedics;  Laterality: Left;  . AMPUTATION Left 11/09/2013   Procedure: LEFT AMPUTATION BELOW KNEE;  Surgeon: NaMarianna PaymentMD;  Location: [facility] Service: Orthopedics;  Laterality: Left;  . AMPUTATION Right 01/04/2014   Procedure: GrDoristine Devoidecond and third toe amputation;  Surgeon: NaMarianna PaymentMD;  Location: [facility] Service: Orthopedics;  Laterality: Right;  . AMPUTATION Right 01/30/2014   Procedure: RIGHT BELOW KNEE AMPUTATION;  Surgeon:  Naiping Glee Arvin, MD;  [facility];  Service: Orthopedics;  Laterality: Right;  . ATHERECTOMY Right 12/29/2013   Procedure: ATHERECTOMY;  Surgeon: Runell Gess, MD;  Location: [facility] C[facility] LAB;  Service: Cardiovascular;  Laterality: Right;  Anterior Tibial Artery  . CARDIAC CATHETERIZATION  10/01/2001   THERE WAS GLOBAL HYPOKINESIS AND EF 25%. THERE APPEARED TO BE GLOBAL DECREASE IN WALL MOTION  . CARDIAC DEFIBRILLATOR PLACEMENT  06/2009   WITH GENERATOR REPLACED; BiV ICD  . CARDIOVASCULAR STRESS TEST  03/20/2009   EF 33%  . CERVICAL SPINE SURGERY  1994  . COLONOSCOPY    . ESOPHAGOGASTRODUODENOSCOPY    . HIP ARTHROPLASTY Left 12/28/2015   Procedure:  POSTERIOR  APPROACH HEMI HIP ARTHROPLASTY;  Surgeon: Tarry Kos, MD;  Location: [facility] OR;  Service: Orthopedics;  Laterality: Left;  . 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 LITHOTRIPSY  2001  . LOWER EXTREMITY ANGIOGRAM Bilateral 09/12/2013   Procedure: LOWER EXTREMITY ANGIOGRAM;  Surgeon: Runell Gess, MD;  Location: [facility] C[facility] LAB;  Service: Cardiovascular;  Laterality: Bilateral;  . LOWER EXTREMITY ANGIOGRAM N/A 12/29/2013   Procedure: LOWER EXTREMITY ANGIOGRAM;  Surgeon: Runell Gess, MD;  Location[facility] LAB;  Service: Cardiovascular;  Laterality: N/A;  . STUMP REVISION Left 01/03/2015   Procedure: LEFT BELOW KNEE AMPUTATION REVISION;  Surgeon: Tarry Kos, MD;  Location: [facility] OR;  Service: Orthopedics;  Laterality: Left;  . TOE AMPUTATION  10/04/2013   LEFT GREAT TOE AND 4TH TOE   /   DR Roda Shutters  . TRANSLUMINAL ATHERECTOMY TIBIAL ARTERY Left 09/12/2013  . US ECHOCARDIOGRAPHY  03/21/2008   EF 30-35%    Family History: Family History  Problem Relation Age of Onset  . Heart disease Mother   . Hypertension Mother   . Diabetes Mother   . Diabetes Father     Social History:   reports that he has never smoked. He has never used smokeless tobacco. He reports that he does not drink alcohol or use drugs.  Medications: Medications Prior to Admission  Medication Sig Dispense Refill  . atorvastatin (LIPITOR) 10 MG tablet Take 1 tablet (10 mg total) by mouth daily. 30 tablet 1  . carvedilol (COREG) 12.5 MG tablet Take 1 tablet (12.5 mg total) by mouth 2 (two) times daily with a meal. 60 tablet 1  . cholecalciferol (VITAMIN D) 1000 UNITS tablet Take 1 tablet (1,000 Units total) by mouth daily. 30 tablet 1  . Cholecalciferol (VITAMIN D3) 10000 units TABS Take 1 tablet by mouth daily.    . clopidogrel (PLAVIX) 75 MG tablet Take 1 tablet (75 mg total) by mouth daily with breakfast. 30 tablet 1  . furosemide (LASIX) 40 MG tablet Take 1 tablet (40 mg total) by mouth daily. 30 tablet 1   . insulin detemir (LEVEMIR) 100 UNIT/ML injection 25 unit AM and 35 unit PM (Patient taking differently: Inject 25-35 Units into the skin See admin instructions. 25 unit AM and 35 unit PM) 10 mL 1  . iron polysaccharides (NIFEREX) 150 MG capsule Take 1 capsule (150 mg total) by mouth daily. 30 capsule 1  . Multiple Vitamin (MULTIVITAMIN WITH MINERALS) TABS tablet Take 1 tablet by mouth daily.    [name] omeprazole (PRILOSEC) 20 MG capsule Take 20 mg by mouth daily.    . potassium chloride SA (K-DUR,KLOR-CON) 20 MEQ tablet Take 1 tablet (20 mEq total) by mouth daily. 30 tablet 1  . pregabalin (LYRICA) 75 MG  capsule Take 1 capsule (75 mg total) by mouth 2 (two) times daily. 60 capsule 1  . sacubitril-valsartan (ENTRESTO) 24-26 MG Take 1 tablet by mouth 2 (two) times daily. 60 tablet 6  . senna-docusate (SENOKOT-S) 8.6-50 MG per tablet Take 1 tablet by mouth 2 (two) times daily. 60 tablet 1  . spironolactone (ALDACTONE) 25 MG tablet Take 25 mg by mouth daily. TAKES HALF A TABLET    . tamsulosin (FLOMAX) 0.4 MG CAPS capsule Take 1 capsule (0.4 mg total) by mouth daily after supper. 30 capsule 1  . warfarin (COUMADIN) 10 MG tablet Take 10 mg by mouth daily. Take 10 mg Mon, Wed , Friday .  Take '15mg'$  on Sunday, tues, Thurs and Sat      Results for orders placed or performed during the hospital encounter of 04/30/16 (from the past 48 hour(s))  CBG monitoring, ED     Status: Abnormal   Collection Time: 04/30/16 12:03 AM  Result Value Ref Range   Glucose-Capillary 249 (H) 65 - 99 mg/dL   Comment 1 Notify RN   Comprehensive metabolic panel     Status: Abnormal   Collection Time: 04/30/16 12:47 AM  Result Value Ref Range   Sodium 136 135 - 145 mmol/L   Potassium 4.6 3.5 - 5.1 mmol/L   Chloride 105 101 - 111 mmol/L   CO2 23 22 - 32 mmol/L   Glucose, Bld 253 (H) 65 - 99 mg/dL   BUN 35 (H) 6 - 20 mg/dL   Creatinine, Ser 1.84 (H) 0.61 - 1.24 mg/dL   Calcium 8.5 (L) 8.9 - 10.3 mg/dL   Total Protein 7.1 6.5  - 8.1 g/dL   Albumin 3.4 (L) 3.5 - 5.0 g/dL   AST 18 15 - 41 U/L   ALT 16 (L) 17 - 63 U/L   Alkaline Phosphatase 54 38 - 126 U/L   Total Bilirubin 0.4 0.3 - 1.2 mg/dL   GFR calc non Af Amer 35 (L) >60 mL/min   GFR calc Af Amer 41 (L) >60 mL/min    Comment: (NOTE) The eGFR has been calculated using the CKD EPI equation. This calculation has not been validated in all clinical situations. eGFR's persistently <60 mL/min signify possible Chronic Kidney Disease.    Anion gap 8 5 - 15  CBC WITH DIFFERENTIAL     Status: Abnormal   Collection Time: 04/30/16 12:47 AM  Result Value Ref Range   WBC 7.3 4.0 - 10.5 K/uL   RBC 4.30 4.22 - 5.81 MIL/uL   Hemoglobin 10.5 (L) 13.0 - 17.0 g/dL   HCT 31.2 (L) 39.0 - 52.0 %   MCV 72.6 (L) 78.0 - 100.0 fL   MCH 24.4 (L) 26.0 - 34.0 pg   MCHC 33.7 30.0 - 36.0 g/dL   RDW 18.2 (H) 11.5 - 15.5 %   Platelets 174 150 - 400 K/uL    Comment: REPEATED TO VERIFY SPECIMEN CHECKED FOR CLOTS    Neutrophils Relative % 83 %   Neutro Abs 6.1 1.7 - 7.7 K/uL   Lymphocytes Relative 8 %   Lymphs Abs 0.6 (L) 0.7 - 4.0 K/uL   Monocytes Relative 8 %   Monocytes Absolute 0.6 0.1 - 1.0 K/uL   Eosinophils Relative 1 %   Eosinophils Absolute 0.0 0.0 - 0.7 K/uL   Basophils Relative 0 %   Basophils Absolute 0.0 0.0 - 0.1 K/uL  Blood Culture (routine x 2)     Status: None (Preliminary result)   Collection  Time: 04/30/16 12:50 AM  Result Value Ref Range   Specimen Description BLOOD RIGHT WRIST    Special Requests BOTTLES DRAWN AEROBIC AND ANAEROBIC 10CC    Culture      NO GROWTH 1 DAY Performed at [facility]    Report Status PENDING   I-stat troponin, ED (not at [facility])     Status: None   Collection Time: 04/30/16  1:01 AM  Result Value Ref Range   Troponin i, poc 0.01 0.00 - 0.08 ng/mL   Comment 3            Comment: Due to the release kinetics of cTnI, a negative result within the first hours of the onset of symptoms does not rule out myocardial  infarction with certainty. If myocardial infarction is still suspected, repeat the test at appropriate intervals.   I-Stat CG4 Lactic Acid, ED  (not at  [facility])     Status: None   Collection Time: 04/30/16  1:03 AM  Result Value Ref Range   Lactic Acid, Venous 1.41 0.5 - 1.9 mmol/L  Blood Culture (routine x 2)     Status: None (Preliminary result)   Collection Time: 04/30/16  1:08 AM  Result Value Ref Range   Specimen Description BLOOD LEFT ANTECUBITAL    Special Requests BOTTLES DRAWN AEROBIC AND ANAEROBIC 5CC    Culture      NO GROWTH 1 DAY Performed at [facility]    Report Status PENDING   Urinalysis, Routine w reflex microscopic (not at [facility])     Status: Abnormal   Collection Time: 04/30/16  2:18 AM  Result Value Ref Range   Color, Urine YELLOW YELLOW   APPearance CLEAR CLEAR   Specific Gravity, Urine 1.015 1.005 - 1.030   pH 5.5 5.0 - 8.0   Glucose, UA 100 (A) NEGATIVE mg/dL   Hgb urine dipstick SMALL (A) NEGATIVE   Bilirubin Urine NEGATIVE NEGATIVE   Ketones, ur NEGATIVE NEGATIVE mg/dL   Protein, ur NEGATIVE NEGATIVE mg/dL   Nitrite NEGATIVE NEGATIVE   Leukocytes, UA NEGATIVE NEGATIVE  Urine culture     Status: None   Collection Time: 04/30/16  2:18 AM  Result Value Ref Range   Specimen Description URINE, CATHETERIZED    Special Requests NONE    Culture NO GROWTH Performed at [facility]     Report Status 05/01/2016 FINAL   Urine microscopic-add on     Status: Abnormal   Collection Time: 04/30/16  2:18 AM  Result Value Ref Range   Squamous Epithelial / LPF 0-5 (A) NONE SEEN   WBC, UA 0-5 0 - 5 WBC/hpf   RBC / HPF 0-5 0 - 5 RBC/hpf   Bacteria, UA FEW (A) NONE SEEN  I-Stat CG4 Lactic Acid, ED  (not at  [facility])     Status: None   Collection Time: 04/30/16  4:12 AM  Result Value Ref Range   Lactic Acid, Venous 0.82 0.5 - 1.9 mmol/L  Troponin I (q 6hr x 3)     Status: None   Collection Time: 04/30/16  5:56 AM  Result Value Ref Range   Troponin I  <0.03 <0.03 ng/mL  CK     Status: None   Collection Time: 04/30/16  5:56 AM  Result Value Ref Range   Total CK 201 49 - 397 U/L  TSH     Status: None   Collection Time: 04/30/16  5:56 AM  Result Value Ref Range   TSH  2.516 0.350 - 4.500 uIU/mL  Comprehensive metabolic panel     Status: Abnormal   Collection Time: 04/30/16  5:56 AM  Result Value Ref Range   Sodium 137 135 - 145 mmol/L   Potassium 4.2 3.5 - 5.1 mmol/L   Chloride 107 101 - 111 mmol/L   CO2 23 22 - 32 mmol/L   Glucose, Bld 257 (H) 65 - 99 mg/dL   BUN 37 (H) 6 - 20 mg/dL   Creatinine, Ser 1.77 (H) 0.61 - 1.24 mg/dL   Calcium 8.3 (L) 8.9 - 10.3 mg/dL   Total Protein 6.5 6.5 - 8.1 g/dL   Albumin 3.0 (L) 3.5 - 5.0 g/dL   AST 17 15 - 41 U/L   ALT 15 (L) 17 - 63 U/L   Alkaline Phosphatase 54 38 - 126 U/L   Total Bilirubin 0.6 0.3 - 1.2 mg/dL   GFR calc non Af Amer 37 (L) >60 mL/min   GFR calc Af Amer 42 (L) >60 mL/min    Comment: (NOTE) The eGFR has been calculated using the CKD EPI equation. This calculation has not been validated in all clinical situations. eGFR's persistently <60 mL/min signify possible Chronic Kidney Disease.    Anion gap 7 5 - 15  CBC WITH DIFFERENTIAL     Status: Abnormal   Collection Time: 04/30/16  5:56 AM  Result Value Ref Range   WBC 5.3 4.0 - 10.5 K/uL   RBC 4.14 (L) 4.22 - 5.81 MIL/uL   Hemoglobin 9.9 (L) 13.0 - 17.0 g/dL   HCT 30.2 (L) 39.0 - 52.0 %   MCV 72.9 (L) 78.0 - 100.0 fL   MCH 23.9 (L) 26.0 - 34.0 pg   MCHC 32.8 30.0 - 36.0 g/dL   RDW 18.1 (H) 11.5 - 15.5 %   Platelets 153 150 - 400 K/uL   Neutrophils Relative % 76 %   Neutro Abs 4.0 1.7 - 7.7 K/uL   Lymphocytes Relative 12 %   Lymphs Abs 0.7 0.7 - 4.0 K/uL   Monocytes Relative 11 %   Monocytes Absolute 0.6 0.1 - 1.0 K/uL   Eosinophils Relative 1 %   Eosinophils Absolute 0.1 0.0 - 0.7 K/uL   Basophils Relative 0 %   Basophils Absolute 0.0 0.0 - 0.1 K/uL  Cortisol     Status: None   Collection Time: 04/30/16  5:56  AM  Result Value Ref Range   Cortisol, Plasma 11.4 ug/dL    Comment: (NOTE) AM    6.7 - 22.6 ug/dL PM   <10.0       ug/dL Performed at [place]     Status: Abnormal   Collection Time: 04/30/16  5:56 AM  Result Value Ref Range   Prothrombin Time 33.2 (H) 11.4 - 15.2 seconds   INR 3.16   Glucose, capillary     Status: Abnormal   Collection Time: 04/30/16  7:37 AM  Result Value Ref Range   Glucose-Capillary 225 (H) 65 - 99 mg/dL  Glucose, capillary     Status: Abnormal   Collection Time: 04/30/16 11:52 AM  Result Value Ref Range   Glucose-Capillary 319 (H) 65 - 99 mg/dL  Troponin I (q 6hr x 3)     Status: None   Collection Time: 04/30/16 11:53 AM  Result Value Ref Range   Troponin I <0.03 <0.03 ng/mL  Glucose, capillary     Status: Abnormal   Collection Time: 04/30/16  4:36 PM  Result Value Ref  Range   Glucose-Capillary 368 (H) 65 - 99 mg/dL  Troponin I (q 6hr x 3)     Status: None   Collection Time: 04/30/16  5:33 PM  Result Value Ref Range   Troponin I <0.03 <0.03 ng/mL  Glucose, capillary     Status: Abnormal   Collection Time: 04/30/16  9:32 PM  Result Value Ref Range   Glucose-Capillary 318 (H) 65 - 99 mg/dL  Protime-INR     Status: Abnormal   Collection Time: 05/01/16  5:09 AM  Result Value Ref Range   Prothrombin Time 32.3 (H) 11.4 - 15.2 seconds   INR 3.07   CBC     Status: Abnormal   Collection Time: 05/01/16  5:09 AM  Result Value Ref Range   WBC 5.2 4.0 - 10.5 K/uL   RBC 4.27 4.22 - 5.81 MIL/uL   Hemoglobin 10.2 (L) 13.0 - 17.0 g/dL   HCT 31.4 (L) 39.0 - 52.0 %   MCV 73.5 (L) 78.0 - 100.0 fL   MCH 23.9 (L) 26.0 - 34.0 pg   MCHC 32.5 30.0 - 36.0 g/dL   RDW 18.4 (H) 11.5 - 15.5 %   Platelets 146 (L) 150 - 400 K/uL  Basic metabolic panel     Status: Abnormal   Collection Time: 05/01/16  5:09 AM  Result Value Ref Range   Sodium 138 135 - 145 mmol/L   Potassium 3.9 3.5 - 5.1 mmol/L   Chloride 108 101 - 111 mmol/L   CO2 23 22 - 32  mmol/L   Glucose, Bld 191 (H) 65 - 99 mg/dL   BUN 30 (H) 6 - 20 mg/dL   Creatinine, Ser 1.58 (H) 0.61 - 1.24 mg/dL   Calcium 8.4 (L) 8.9 - 10.3 mg/dL   GFR calc non Af Amer 42 (L) >60 mL/min   GFR calc Af Amer 49 (L) >60 mL/min    Comment: (NOTE) The eGFR has been calculated using the CKD EPI equation. This calculation has not been validated in all clinical situations. eGFR's persistently <60 mL/min signify possible Chronic Kidney Disease.    Anion gap 7 5 - 15  Uric acid     Status: Abnormal   Collection Time: 05/01/16  5:09 AM  Result Value Ref Range   Uric Acid, Serum 10.0 (H) 4.4 - 7.6 mg/dL  Glucose, capillary     Status: Abnormal   Collection Time: 05/01/16  7:49 AM  Result Value Ref Range   Glucose-Capillary 161 (H) 65 - 99 mg/dL  Glucose, capillary     Status: Abnormal   Collection Time: 05/01/16 11:58 AM  Result Value Ref Range   Glucose-Capillary 194 (H) 65 - 99 mg/dL  Glucose, capillary     Status: Abnormal   Collection Time: 05/01/16  4:54 PM  Result Value Ref Range   Glucose-Capillary 218 (H) 65 - 99 mg/dL    Dg Chest 2 View  Result Date: 04/30/2016 CLINICAL DATA:  Acute onset of cough and generalized weakness. Falls, with body aches. Initial encounter. EXAM: CHEST  2 VIEW COMPARISON:  Chest radiograph performed 12/27/2015 FINDINGS: The lungs are well-aerated. Mild vascular congestion is noted. Mild left basilar opacity may reflect mild pneumonia. There is no evidence of pleural effusion or pneumothorax. The heart is mildly enlarged. A pacemaker/AICD is noted overlying the left chest wall, with leads ending overlying the right atrium, right ventricle and coronary sinus. No acute osseous abnormalities are seen. IMPRESSION: Mild vascular congestion and mild cardiomegaly. Mild left basilar opacity  may reflect pneumonia. Electronically Signed   By: Garald Balding M.D.   On: 04/30/2016 01:13   Dg Pelvis 1-2 Views  Result Date: 04/30/2016 CLINICAL DATA:  Status post  multiple falls. EXAM: PELVIS - 1-2 VIEW COMPARISON:  Pelvic radiograph 12/28/2015 FINDINGS: Left total hip arthroplasty without abnormal lucency surrounding the components. No periprosthetic fracture. Limited views of the right hip are normal. No visible pelvic fracture. IMPRESSION: 1. Left total hip arthroplasty without periprosthetic fracture. 2. No pelvic fracture. Electronically Signed   By: Ulyses Jarred M.D.   On: 04/30/2016 03:20   Dg Elbow Complete Right (3+view)  Result Date: 04/30/2016 CLINICAL DATA:  RIGHT olecranon pain for 2 days, no injury EXAM: RIGHT ELBOW - COMPLETE 3+ VIEW COMPARISON:  None FINDINGS: Mild diffuse osseous demineralization. Elbow flexed on all images. Small lateral epicondylar spur. Prominent olecranon spur with overlying soft tissue swelling. No acute fracture, dislocation or bone destruction. No definite joint effusion. Extensive atherosclerotic calcifications. IMPRESSION: Olecranon spur with overlying soft tissue swelling which could represent edema or an olecranon bursal collection which can result from hemorrhage, inflammation, or infection. Remainder of exam unremarkable. Electronically Signed   By: Lavonia Dana M.D.   On: 04/30/2016 12:21   Ct Head Wo Contrast  Result Date: 04/30/2016 CLINICAL DATA:  Status post fall. EXAM: CT HEAD WITHOUT CONTRAST TECHNIQUE: Contiguous axial images were obtained from the base of the skull through the vertex without intravenous contrast. COMPARISON:  Head CT 12/27/2015 FINDINGS: Brain: No mass lesion, intraparenchymal hemorrhage or extra-axial collection. No evidence of acute cortical infarct. Hypoattenuation within the posterior right frontal lobe is unchanged. Mild periventricular hypoattenuation compatible with chronic microvascular disease is again seen. Unchanged mineralization in the brainstem. Vascular: Atherosclerotic calcification of the internal carotid artery is and vertebral arteries is present. Skull: Normal visualized skull  base, calvarium and extracranial soft tissues. Sinuses/Orbits: No sinus fluid levels or advanced mucosal thickening. No mastoid effusion. Normal orbits. IMPRESSION: 1. No acute intracranial abnormality. 2. 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 Electronically Signed   By: Ulyses Jarred M.D.   On: 04/30/2016 03:08     A comprehensive review of systems was negative. Review of Systems: No fevers, chills, night sweats, chest pain, shortness of breath, nausea, vomiting, diarrhea, constipation, easy bleeding or bruising, headaches, dizziness, vision changes, fainting.   Blood pressure (!) 126/92, pulse 85, temperature 99.1 F (37.3 C), temperature source Oral, resp. rate 18, height '5\' 9"'$  (1.753 m), weight 94.4 kg (208 lb 1.6 oz), SpO2 96 %.  General appearance: alert, cooperative and appears stated age Head: Normocephalic, without obvious abnormality, atraumatic Neck: supple, symmetrical, trachea midline Extremities: Intact sensation and capillary refill all digits.  +epl/fpl/io.  No wounds. Swelling at right elbow.  No erythema.  Most tender at olecranon.  Also some tenderness at lateral epicondyle.  No medial or anterior tenderness.  Swelling posterior and lateral. Pulses: 2+ and symmetric Skin: Skin color, texture, turgor normal. No rashes or lesions Neurologic: Grossly normal Incision/Wound: none  Assessment/Plan Olecranon traction spur with fracture.  Does not appear to be infection.  Recommend sling for comfort and gentle ROM exercises as tolerated.  Chibuike Fleek R 05/01/2016, 5:23 PM

## 2016-05-01 NOTE — Progress Notes (Signed)
PROGRESS NOTE    [name] [name]  [id number] DOB: 08/17/1946 DOA: 04/30/2016 PCP: [name], MD   Chief Complaint  Patient presents with  . Weakness     Brief Narrative:  HPI on 04/30/2016 by Dr. Gean Birchwood Hollace Kinnier [name] [name] is a 70 y.o. 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 Patient wife states that patient had a fall while trying to get out of the bed but denies losing consciousness. Since the fall patient is finding it difficult to walk. CT of the head and x-ray of the pelvis are unremarkable. Chest x-ray shows possible pneumonia with congestion. Patient was started on antibiotics for pneumonia and admitted for further management of weakness. On exam patient appears nonfocal but generally weak. As per patient wife patient has been only receiving Lasix 40 mg daily over the last 1 month since patient has been urinating more than usual. Patient is supposed to be on Lasix 40 mg twice daily.  Assessment & Plan   Generalized weakness/fall -Likely secondary to deconditioning versus hypertension -Lasix currently held -Troponins cycled and negative -TSH 2.516 -CK 201 -PT consult and appreciated recommending skilled nursing facility -Pending orthostatic vitals  Questionable pneumonia -She has had cough for approximately 2-3 weeks -Per wife, patient has had some episodes of choking -Speech therapy consulted -Chest x-ray: Mild vascular congestion with cardiomegaly, mild left basilar opacity may reflect pneumonia -Currently on vancomycin and cefepime -Will order repeat CXR  Paroxysmal atrial fibrillation -Currently in sinus rhythm, rate controlled -Continue Coumadin per pharmacy -Continue  Coreg  Nonischemic cardiomyopathy/Chronic systolic CHF -Echocardiogram in 2014 showed an EF between 25%, status post defibrillator placement -Lasix currently held due to weakness and fall -Continue Ernesto and spironolactone  Diabetes mellitus, type II -Continue Levemir comments complaints on CBG monitoring  Chronic anemia -Hemoglobin currently 10.2 (baseline 8) -Continue to monitor CBC  Hyperlipidemia -Continue statin  Chronic kidney disease, stage III -Currently appears to be at baseline, continue to monitor BMP  Right elbow pain -Obtained xray: Olecranon spur with overlying soft tissue swelling which could represent edema or bursal collection -Patient denies trauma, although did fall prior to admission -Continue supportive care -Unlikely due to infection this patient currently afebrile with no leukocytosis -Increased edema -Will consult hand surgery  DVT Prophylaxis  Coumadin   Code Status: Full  Family Communication: None at bedside  Disposition Plan:   Consultants Orthopedic surgery  Procedures  none  Antibiotics   Anti-infectives    Start     Dose/Rate Route Frequency Ordered Stop   04/30/16 1200  vancomycin (VANCOCIN) IVPB 750 mg/150 ml premix     750 mg 150 mL/hr over 60 Minutes Intravenous Every 12 hours 04/30/16 0520     04/30/16 0445  ceFEPIme (MAXIPIME) 2 g in dextrose 5 % 50 mL IVPB     2 g 100 mL/hr over 30 Minutes Intravenous 2 times daily 04/30/16 0425 05/08/16 0759   04/30/16 0045  piperacillin-tazobactam (ZOSYN) IVPB 3.375 g     3.375 g 100 mL/hr over 30 Minutes Intravenous  Once 04/30/16 0042 04/30/16 0155   04/30/16 0045  vancomycin (VANCOCIN) IVPB 1000 mg/200 mL premix     1,000 mg 200 mL/hr over 60 Minutes Intravenous  Once 04/30/16 0042 04/30/16 0255  Subjective:   [name] seen and examined today.  Patient complains of feeling weak but feels he is improving. Continues to complain of right elbow pain. Currently denies  chest pain, shortness of breath, abdominal pain, nausea or vomiting, diarrhea or constipation.    Objective:   Vitals:   04/30/16 1512 04/30/16 2136 05/01/16 0657 05/01/16 0823  BP: 119/64 (!) 109/58 (!) 114/57 (!) 111/58  Pulse: 86 85 82 87  Resp: 20 20 18    Temp: 98.5 F (36.9 C) 100.1 F (37.8 C) 99.2 F (37.3 C)   TempSrc: Oral Oral Oral   SpO2: 97% 95% 97%   Weight:   94.4 kg (208 lb 1.6 oz)   Height:        Intake/Output Summary (Last 24 hours) at 05/01/16 1314 Last data filed at 05/01/16 0837  Gross per 24 hour  Intake              860 ml  Output             1375 ml  Net             -515 ml   Filed Weights   04/30/16 0005 04/30/16 0535 05/01/16 0657  Weight: 94.8 kg (209 lb) 98.9 kg (218 lb 1.6 oz) 94.4 kg (208 lb 1.6 oz)    Exam  General: Well developed, well nourished, NAD, appears stated age  80: NCAT,mucous membranes moist.   Cardiovascular: S1 S2 auscultated, no rubs, murmurs or gallops. Regular rate and rhythm.  Respiratory: Clear to auscultation bilaterally with equal chest rise  Abdomen: Soft, obese,  nontender, nondistended, + bowel sounds  Extremities:  B/L LE BKA. Edema Right elbow  Neuro: AAOx3, nonfocal  Psych: Normal affect and demeanor with intact judgement and insight, pleasant   Data Reviewed: I have personally reviewed following labs and imaging studies  CBC:  Recent Labs Lab 04/30/16 0047 04/30/16 0556 05/01/16 0509  WBC 7.3 5.3 5.2  NEUTROABS 6.1 4.0  --   HGB 10.5* 9.9* 10.2*  HCT 31.2* 30.2* 31.4*  MCV 72.6* 72.9* 73.5*  PLT 174 153 123456*   Basic Metabolic Panel:  Recent Labs Lab 04/30/16 0047 04/30/16 0556 05/01/16 0509  NA 136 137 138  K 4.6 4.2 3.9  CL 105 107 108  CO2 23 23 23   GLUCOSE 253* 257* 191*  BUN 35* 37* 30*  CREATININE 1.84* 1.77* 1.58*  CALCIUM 8.5* 8.3* 8.4*   GFR: Estimated Creatinine Clearance: 47.9 mL/min (by C-G formula based on SCr of 1.58 mg/dL). Liver Function Tests:  Recent  Labs Lab 04/30/16 0047 04/30/16 0556  AST 18 17  ALT 16* 15*  ALKPHOS 54 54  BILITOT 0.4 0.6  PROT 7.1 6.5  ALBUMIN 3.4* 3.0*   No results for input(s): LIPASE, AMYLASE in the last 168 hours. No results for input(s): AMMONIA in the last 168 hours. Coagulation Profile:  Recent Labs Lab 04/30/16 0556 05/01/16 0509  INR 3.16 3.07   Cardiac Enzymes:  Recent Labs Lab 04/30/16 0556 04/30/16 1153 04/30/16 1733  CKTOTAL 201  --   --   TROPONINI <0.03 <0.03 <0.03   BNP (last 3 results) No results for input(s): PROBNP in the last 8760 hours. HbA1C: No results for input(s): HGBA1C in the last 72 hours. CBG:  Recent Labs Lab 04/30/16 1152 04/30/16 1636 04/30/16 2132 05/01/16 0749 05/01/16 1158  GLUCAP 319* 368* 318* 161* 194*   Lipid Profile: No results for input(s): CHOL, HDL, LDLCALC, TRIG, CHOLHDL, LDLDIRECT in the  last 72 hours. Thyroid Function Tests:  Recent Labs  04/30/16 0556  TSH 2.516   Anemia Panel: No results for input(s): VITAMINB12, FOLATE, FERRITIN, TIBC, IRON, RETICCTPCT in the last 72 hours. Urine analysis:    Component Value Date/Time   COLORURINE YELLOW 04/30/2016 [place] 04/30/2016 0218   LABSPEC 1.015 04/30/2016 0218   PHURINE 5.5 04/30/2016 0218   GLUCOSEU 100 (A) 04/30/2016 0218   HGBUR SMALL (A) 04/30/2016 0218   BILIRUBINUR NEGATIVE 04/30/2016 0218   KETONESUR NEGATIVE 04/30/2016 0218   PROTEINUR NEGATIVE 04/30/2016 0218   UROBILINOGEN 0.2 01/11/2015 1850   NITRITE NEGATIVE 04/30/2016 0218   LEUKOCYTESUR NEGATIVE 04/30/2016 0218   Sepsis Labs: @LABRCNTIP (procalcitonin:4,lacticidven:4)  ) Recent Results (from the past 240 hour(s))  Urine culture     Status: None   Collection Time: 04/30/16  2:18 AM  Result Value Ref Range Status   Specimen Description URINE, CATHETERIZED  Final   Special Requests NONE  Final   Culture NO GROWTH Performed at [facility]   Final   Report Status 05/01/2016 FINAL   Final      Radiology Studies: Dg Chest 2 View  Result Date: 04/30/2016 CLINICAL DATA:  Acute onset of cough and generalized weakness. Falls, with body aches. Initial encounter. EXAM: CHEST  2 VIEW COMPARISON:  Chest radiograph performed 12/27/2015 FINDINGS: The lungs are well-aerated. Mild vascular congestion is noted. Mild left basilar opacity may reflect mild pneumonia. There is no evidence of pleural effusion or pneumothorax. The heart is mildly enlarged. A pacemaker/AICD is noted overlying the left chest wall, with leads ending overlying the right atrium, right ventricle and coronary sinus. No acute osseous abnormalities are seen. IMPRESSION: Mild vascular congestion and mild cardiomegaly. Mild left basilar opacity may reflect pneumonia. Electronically Signed   By: Garald Balding M.D.   On: 04/30/2016 01:13   Dg Pelvis 1-2 Views  Result Date: 04/30/2016 CLINICAL DATA:  Status post multiple falls. EXAM: PELVIS - 1-2 VIEW COMPARISON:  Pelvic radiograph 12/28/2015 FINDINGS: Left total hip arthroplasty without abnormal lucency surrounding the components. No periprosthetic fracture. Limited views of the right hip are normal. No visible pelvic fracture. IMPRESSION: 1. Left total hip arthroplasty without periprosthetic fracture. 2. No pelvic fracture. Electronically Signed   By: Ulyses Jarred M.D.   On: 04/30/2016 03:20   Dg Elbow Complete Right (3+view)  Result Date: 04/30/2016 CLINICAL DATA:  RIGHT olecranon pain for 2 days, no injury EXAM: RIGHT ELBOW - COMPLETE 3+ VIEW COMPARISON:  None FINDINGS: Mild diffuse osseous demineralization. Elbow flexed on all images. Small lateral epicondylar spur. Prominent olecranon spur with overlying soft tissue swelling. No acute fracture, dislocation or bone destruction. No definite joint effusion. Extensive atherosclerotic calcifications. IMPRESSION: Olecranon spur with overlying soft tissue swelling which could represent edema or an olecranon bursal collection which  can result from hemorrhage, inflammation, or infection. Remainder of exam unremarkable. Electronically Signed   By: Lavonia Dana M.D.   On: 04/30/2016 12:21   Ct Head Wo Contrast  Result Date: 04/30/2016 CLINICAL DATA:  Status post fall. EXAM: CT HEAD WITHOUT CONTRAST TECHNIQUE: Contiguous axial images were obtained from the base of the skull through the vertex without intravenous contrast. COMPARISON:  Head CT 12/27/2015 FINDINGS: Brain: No mass lesion, intraparenchymal hemorrhage or extra-axial collection. No evidence of acute cortical infarct. Hypoattenuation within the posterior right frontal lobe is unchanged. Mild periventricular hypoattenuation compatible with chronic microvascular disease is again seen. Unchanged mineralization in the brainstem. Vascular: Atherosclerotic calcification of  the internal carotid artery is and vertebral arteries is present. Skull: Normal visualized skull base, calvarium and extracranial soft tissues. Sinuses/Orbits: No sinus fluid levels or advanced mucosal thickening. No mastoid effusion. Normal orbits. IMPRESSION: 1. No acute intracranial abnormality. 2. 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 Electronically Signed   By: Ulyses Jarred M.D.   On: 04/30/2016 03:08     Scheduled Meds: . atorvastatin  10 mg Oral Daily  . carvedilol  12.5 mg Oral BID WC  . ceFEPime (MAXIPIME) IV  2 g Intravenous BID  . cholecalciferol   Oral Daily  . clopidogrel  75 mg Oral Q breakfast  . insulin aspart  0-5 Units Subcutaneous QHS  . insulin aspart  0-9 Units Subcutaneous TID WC  . insulin detemir  25 Units Subcutaneous Daily  . insulin detemir  35 Units Subcutaneous QHS  . iron polysaccharides  150 mg Oral Daily  . multivitamin with minerals  1 tablet Oral Daily  . pantoprazole  40 mg Oral Daily  . pregabalin  75 mg Oral BID  . sacubitril-valsartan  1 tablet Oral BID  . senna-docusate  1 tablet Oral BID  . spironolactone  25 mg Oral  Daily  . tamsulosin  0.4 mg Oral QPC supper  . vancomycin  750 mg Intravenous Q12H  . warfarin  7.5 mg Oral ONCE-1800  . Warfarin - Pharmacist Dosing Inpatient   Does not apply q1800   Continuous Infusions:    LOS: 0 days   Time Spent in minutes   30 minutes  Inari Shin D.O. on 05/01/2016 at 1:14 PM  Between 7am to 7pm - Pager - [phone]  After 7pm go to [email or web address] - password [email or web address]  And look for the night coverage person covering for me after hours  [facility] Office  [phone]

## 2016-05-01 NOTE — Clinical Social Work Placement (Signed)
Patient has a bed at [facility]. CSW has completed FL2 & will continue to follow and assist with discharge when ready.    Raynaldo Opitz, [facility] Clinical Social Worker cell #: [phone]     CLINICAL SOCIAL WORK PLACEMENT  NOTE  Date:  05/01/2016  Patient Details  Name: [name] [name] MRN: [id number] Date of Birth: Nov 11, 1945  Clinical Social Work is seeking post-discharge placement for this patient at the [place] level of care (*CSW will initial, date and re-position this form in  chart as items are completed):  Yes   Patient/family provided with [facility] Work Department's list of facilities offering this level of care within the geographic area requested by the patient (or if unable, by the patient's family).  Yes   Patient/family informed of their freedom to choose among providers that offer the needed level of care, that participate in Medicare, Medicaid or managed care program needed by the patient, have an available bed and are willing to accept the patient.  Yes   Patient/family informed of [facility]'s ownership interest in [facility] and [facility], as well as of the fact that they are under no obligation to receive care at these facilities.  PASRR submitted to EDS on       PASRR number received on       Existing PASRR number confirmed on 04/30/16     FL2 transmitted to all facilities in geographic area requested by pt/family on 04/30/16     FL2 transmitted to all facilities within larger geographic area on       Patient informed that his/her managed care company has contracts with or will negotiate with certain facilities, including the following:        Yes   Patient/family informed of bed offers received.  Patient chooses bed at [place], [place]     Physician recommends and patient chooses bed at      Patient to be transferred to [place], [place] on   .  Patient to be transferred to facility by       Patient family notified on   of transfer.  Name of family member notified:        PHYSICIAN       Additional Comment:    _______________________________________________ Standley Brooking, LCSW 05/01/2016, 3:21 PM

## 2016-05-02 ENCOUNTER — Observation Stay (HOSPITAL_COMMUNITY): Payer: Medicare Other

## 2016-05-02 ENCOUNTER — Observation Stay (HOSPITAL_BASED_OUTPATIENT_CLINIC_OR_DEPARTMENT_OTHER): Payer: Medicare Other

## 2016-05-02 DIAGNOSIS — J189 Pneumonia, unspecified organism: Secondary | ICD-10-CM | POA: Diagnosis not present

## 2016-05-02 DIAGNOSIS — E1140 Type 2 diabetes mellitus with diabetic neuropathy, unspecified: Secondary | ICD-10-CM | POA: Diagnosis not present

## 2016-05-02 DIAGNOSIS — M7989 Other specified soft tissue disorders: Secondary | ICD-10-CM

## 2016-05-02 DIAGNOSIS — R531 Weakness: Secondary | ICD-10-CM | POA: Diagnosis not present

## 2016-05-02 DIAGNOSIS — I5022 Chronic systolic (congestive) heart failure: Secondary | ICD-10-CM | POA: Diagnosis not present

## 2016-05-02 DIAGNOSIS — M79609 Pain in unspecified limb: Secondary | ICD-10-CM | POA: Diagnosis not present

## 2016-05-02 LAB — GLUCOSE, CAPILLARY
GLUCOSE-CAPILLARY: 213 mg/dL — AB (ref 65–99)
Glucose-Capillary: 302 mg/dL — ABNORMAL HIGH (ref 65–99)
Glucose-Capillary: 257 mg/dL — ABNORMAL HIGH (ref 65–99)
Glucose-Capillary: 288 mg/dL — ABNORMAL HIGH (ref 65–99)

## 2016-05-02 LAB — PROTIME-INR
INR: 2.85
PROTHROMBIN TIME: 30.5 s — AB (ref 11.4–15.2)

## 2016-05-02 MED ORDER — FUROSEMIDE 40 MG PO TABS
40.0000 mg | ORAL_TABLET | Freq: Every day | ORAL | Status: DC
  Administered 2016-05-02 – 2016-05-03 (×2): 40 mg via ORAL
  Filled 2016-05-02 (×2): qty 1

## 2016-05-02 MED ORDER — WARFARIN SODIUM 5 MG PO TABS
7.5000 mg | ORAL_TABLET | Freq: Once | ORAL | Status: AC
  Administered 2016-05-02: 7.5 mg via ORAL
  Filled 2016-05-02: qty 1

## 2016-05-02 NOTE — Progress Notes (Signed)
Physical Therapy Treatment Patient Details Name: [name] [name] MRN: [id number] DOB: 07/17/1946 Today's Date: 05/02/2016    History of Present Illness Pt is a 70 y.o. 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 Pt required assistance to balance on EOB when attempting to don bilateral prostheses. Pt's spouse was requested to bring residual limb shrinkers when she returns. Pt required verbal cues to limit weightbearing and use of R UE due to olecranon spur fracture. Continue to recommend SNF.   Follow Up Recommendations  SNF;Supervision/Assistance - 24 hour     Equipment Recommendations  None recommended by PT    Recommendations for Other Services       Precautions / Restrictions Precautions Precautions: Fall Precaution Comments: B BKA; pt has prostheses in room Required Braces or Orthoses: Sling (for comfort per MD note)    Mobility  Bed Mobility Overal bed mobility: +2 for physical assistance Bed Mobility: Rolling;Supine to Sit;Sit to Supine Rolling: Total assist;+2 for physical assistance   Supine to sit: Total assist;+2 for physical assistance;HOB elevated Sit to supine: Total assist;+2 for physical assistance   General bed mobility comments: pt requiring increased assist for lower body, also unable to self assist with R UE due to pain requiring assist for trunk as well  Transfers                    Ambulation/Gait                 Stairs            Wheelchair Mobility    Modified Rankin (Stroke Patients Only)       Balance  Overall balance assessment: Needs assistance;History of Falls Sitting-balance support: Single extremity supported Sitting balance-Leahy Scale: Poor Sitting balance - Comments: pt demonstrated inconsistent sitting balance; during dual-tasks (such as donning prostheses) the pt's sitting balance and core control decreases                            Cognition Arousal/Alertness: Awake/alert Behavior During Therapy: WFL for tasks assessed/performed Overall Cognitive Status: History of cognitive impairments - at baseline                      Exercises      General Comments        Pertinent Vitals/Pain Pain Assessment: 0-10 Pain Score:  (not rated, however pt states no pain at rest) Pain Location: right elbow and left knee (secondary to swelling) Pain Descriptors / Indicators: Aching Pain Intervention(s): Limited activity within patient's tolerance;Monitored during session;Repositioned    Home Living                      Prior Function            PT Goals (current goals can now be found in the care plan section) Progress towards PT goals: Progressing toward goals (progressing slowly)    Frequency  Min 3X/week    PT Plan Current plan remains appropriate    Co-evaluation  End of Session   Activity Tolerance: Patient limited by pain;Patient limited by fatigue (Pt also limited due to B residual LE swelling) Patient left: in bed;with call bell/phone within reach;with family/visitor present (with B LEs elevated in attempt to alleviate swelling)     Time: [id number] PT Time Calculation (min) (ACUTE ONLY): 22 min  Charges:  $Therapeutic Activity: 8-22 mins                    G Codes:      Dewitt Hoes, SPT Dewitt Hoes 05/02/2016, 1:24 PM

## 2016-05-02 NOTE — Progress Notes (Signed)
VASCULAR LAB PRELIMINARY  PRELIMINARY  PRELIMINARY  PRELIMINARY  Right upper extremity venous duplex completed.    Preliminary report:  Right :  No evidence of DVT or superficial thrombosis.    [name] [name], RVT 05/02/2016, 10:22 AM

## 2016-05-02 NOTE — Progress Notes (Signed)
PROGRESS NOTE    [name] [name]  [id number] DOB: May 18, 1946 DOA: 04/30/2016 PCP: [name], MD   Chief Complaint  Patient presents with  . Weakness     Brief Narrative:  HPI on 04/30/2016 by Dr. Gean Birchwood [name] [name] is a 70 y.o. 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 Patient wife states that patient had a fall while trying to get out of the bed but denies losing consciousness. Since the fall patient is finding it difficult to walk. CT of the head and x-ray of the pelvis are unremarkable. Chest x-ray shows possible pneumonia with congestion. Patient was started on antibiotics for pneumonia and admitted for further management of weakness. On exam patient appears nonfocal but generally weak. As per patient wife patient has been only receiving Lasix 40 mg daily over the last 1 month since patient has been urinating more than usual. Patient is supposed to be on Lasix 40 mg twice daily.  Assessment & Plan   Generalized weakness/fall -Likely secondary to deconditioning versus hypertension -Lasix currently held -Troponins cycled and negative -TSH 2.516 -CK 201 -PT consult and appreciated recommending skilled nursing facility -Orthostatic vitals (sitting/lying) were unremarkable   Questionable pneumonia/Bilateral pleural effusion -She has had cough for approximately 2-3 weeks -Per wife, patient has had some episodes of choking -Speech therapy consulted -Chest x-ray: Mild vascular congestion with cardiomegaly, mild left basilar opacity may reflect pneumonia -Currently on vancomycin and cefepime -Repeat CXR shows small bilateral pleural effusions- will restart lasix  Paroxysmal atrial  fibrillation -Currently in sinus rhythm, rate controlled -Continue Coumadin per pharmacy -Continue Coreg  Nonischemic cardiomyopathy/Chronic systolic CHF -Echocardiogram in 2014 showed an EF between 25%, status post defibrillator placement -Lasix initially currently held due to weakness and fall -Continue Ernesto and spironolactone -Restarted lasix today   Diabetes mellitus, type II -Continue Levemir comments complaints on CBG monitoring  Chronic anemia -Hemoglobin currently 10.2 (baseline 8) -Continue to monitor CBC  Hyperlipidemia -Continue statin  Chronic kidney disease, stage III -Currently appears to be at baseline, continue to monitor BMP  Right elbow pain/Olecranon traction spur with fracture -Obtained xray: Olecranon spur with overlying soft tissue swelling which could represent edema or bursal collection -Patient denies trauma, although did fall prior to admission -Continue supportive care -Unlikely due to infection, currently no leukocytosis -Increased edema -Hand surgery/ortho consulted and appreciated, recommended sling and gentle ROM exercises -RUE doppler negative for DVT  Fever -Possibly secondary to pneumonia vs pleural effusions -continue to monitor  DVT Prophylaxis  Coumadin   Code Status: Full  Family Communication: None at bedside  Disposition Plan:   Consultants Orthopedic surgery  Procedures  none  Antibiotics   Anti-infectives    Start     Dose/Rate Route Frequency Ordered Stop   04/30/16 1200  vancomycin (VANCOCIN) IVPB 750 mg/150 ml premix     750 mg 150 mL/hr over 60 Minutes Intravenous Every 12 hours 04/30/16 0520     04/30/16 0445  ceFEPIme (MAXIPIME) 2 g in dextrose 5 % 50 mL IVPB     2 g 100 mL/hr over 30 Minutes Intravenous 2 times daily 04/30/16 0425 05/08/16 0759   04/30/16 0045  piperacillin-tazobactam (ZOSYN) IVPB 3.375 g     3.375 g 100 mL/hr over 30 Minutes Intravenous  Once 04/30/16 0042 04/30/16 0155   04/30/16 0045   vancomycin (VANCOCIN) IVPB 1000 mg/200 mL premix     1,000 mg 200 mL/hr over 60 Minutes Intravenous  Once 04/30/16 0042 04/30/16 0255      Subjective:   [name] [name] seen and examined today.  Patient continues to feel weak but improving.  Continues to have right arm/elbow pain. Currently denies chest pain, shortness of breath, abdominal pain, nausea or vomiting, diarrhea or constipation.    Objective:   Vitals:   05/01/16 2152 05/01/16 2256 05/02/16 0600 05/02/16 1316  BP: 134/62  (!) 116/59 (!) 114/51  Pulse: 96  85 81  Resp: 18  18 18   Temp: (!) 101.4 F (38.6 C) (!) 100.9 F (38.3 C) 99.4 F (37.4 C) 98 F (36.7 C)  TempSrc: Oral Oral Oral Oral  SpO2: 97%  100% 98%  Weight:      Height:        Intake/Output Summary (Last 24 hours) at 05/02/16 1402 Last data filed at 05/02/16 0610  Gross per 24 hour  Intake              440 ml  Output             1250 ml  Net             -810 ml   Filed Weights   04/30/16 0005 04/30/16 0535 05/01/16 0657  Weight: 94.8 kg (209 lb) 98.9 kg (218 lb 1.6 oz) 94.4 kg (208 lb 1.6 oz)    Exam  General: Well developed, well nourished, NAD  HEENT: NCAT, mucous membranes moist.   Cardiovascular: S1 S2 auscultated, RRR, no murmurs  Respiratory: Diminished breath sounds, no wheezing or rhonchi  Abdomen: Soft, obese,  nontender, nondistended, + bowel sounds  Extremities:  B/L LE BKA. Edema Right elbow  Neuro: AAOx3, nonfocal  Psych: Normal affect and demeanor with intact judgement and insight, pleasant   Data Reviewed: I have personally reviewed following labs and imaging studies  CBC:  Recent Labs Lab 04/30/16 0047 04/30/16 0556 05/01/16 0509  WBC 7.3 5.3 5.2  NEUTROABS 6.1 4.0  --   HGB 10.5* 9.9* 10.2*  HCT 31.2* 30.2* 31.4*  MCV 72.6* 72.9* 73.5*  PLT 174 153 123456*   Basic Metabolic Panel:  Recent Labs Lab 04/30/16 0047 04/30/16 0556 05/01/16 0509  NA 136 137 138  K 4.6 4.2 3.9  CL 105 107 108  CO2 23  23 23   GLUCOSE 253* 257* 191*  BUN 35* 37* 30*  CREATININE 1.84* 1.77* 1.58*  CALCIUM 8.5* 8.3* 8.4*   GFR: Estimated Creatinine Clearance: 47.9 mL/min (by C-G formula based on SCr of 1.58 mg/dL). Liver Function Tests:  Recent Labs Lab 04/30/16 0047 04/30/16 0556  AST 18 17  ALT 16* 15*  ALKPHOS 54 54  BILITOT 0.4 0.6  PROT 7.1 6.5  ALBUMIN 3.4* 3.0*   No results for input(s): LIPASE, AMYLASE in the last 168 hours. No results for input(s): AMMONIA in the last 168 hours. Coagulation Profile:  Recent Labs Lab 04/30/16 0556 05/01/16 0509 05/02/16 0552  INR 3.16 3.07 2.85   Cardiac Enzymes:  Recent Labs Lab 04/30/16 0556 04/30/16 1153 04/30/16 1733  CKTOTAL 201  --   --   TROPONINI <0.03 <0.03 <0.03   BNP (last 3 results) No results for input(s): PROBNP in the last 8760 hours. HbA1C: No results for input(s): HGBA1C in the last 72 hours. CBG:  Recent Labs Lab 05/01/16 1158 05/01/16  1654 05/01/16 2239 05/02/16 0757 05/02/16 1219  GLUCAP 194* 218* 317* 213* 302*   Lipid Profile: No results for input(s): CHOL, HDL, LDLCALC, TRIG, CHOLHDL, LDLDIRECT in the last 72 hours. Thyroid Function Tests:  Recent Labs  04/30/16 0556  TSH 2.516   Anemia Panel: No results for input(s): VITAMINB12, FOLATE, FERRITIN, TIBC, IRON, RETICCTPCT in the last 72 hours. Urine analysis:    Component Value Date/Time   COLORURINE YELLOW 04/30/2016 [place] 04/30/2016 0218   LABSPEC 1.015 04/30/2016 0218   PHURINE 5.5 04/30/2016 0218   GLUCOSEU 100 (A) 04/30/2016 0218   HGBUR SMALL (A) 04/30/2016 0218   BILIRUBINUR NEGATIVE 04/30/2016 0218   KETONESUR NEGATIVE 04/30/2016 0218   PROTEINUR NEGATIVE 04/30/2016 0218   UROBILINOGEN 0.2 01/11/2015 1850   NITRITE NEGATIVE 04/30/2016 0218   LEUKOCYTESUR NEGATIVE 04/30/2016 0218   Sepsis Labs: @LABRCNTIP (procalcitonin:4,lacticidven:4)  ) Recent Results (from the past 240 hour(s))  Blood Culture (routine x  2)     Status: None (Preliminary result)   Collection Time: 04/30/16 12:50 AM  Result Value Ref Range Status   Specimen Description BLOOD RIGHT WRIST  Final   Special Requests BOTTLES DRAWN AEROBIC AND ANAEROBIC 10CC  Final   Culture   Final    NO GROWTH 2 DAYS Performed at [facility]    Report Status PENDING  Incomplete  Blood Culture (routine x 2)     Status: None (Preliminary result)   Collection Time: 04/30/16  1:08 AM  Result Value Ref Range Status   Specimen Description BLOOD LEFT ANTECUBITAL  Final   Special Requests BOTTLES DRAWN AEROBIC AND ANAEROBIC 5CC  Final   Culture   Final    NO GROWTH 2 DAYS Performed at [facility]    Report Status PENDING  Incomplete  Urine culture     Status: None   Collection Time: 04/30/16  2:18 AM  Result Value Ref Range Status   Specimen Description URINE, CATHETERIZED  Final   Special Requests NONE  Final   Culture NO GROWTH Performed at [facility]   Final   Report Status 05/01/2016 FINAL  Final      Radiology Studies: Dg Chest 2 View  Result Date: 05/02/2016 CLINICAL DATA:  Fever EXAM: CHEST  2 VIEW COMPARISON:  04/30/2016 FINDINGS: Lungs are essentially clear. No frank interstitial edema. Mild left basilar atelectasis. Small bilateral pleural effusions, new. Cardiomegaly.  Left subclavian ICD. Visualized osseous structures are within normal limits. IMPRESSION: Small bilateral pleural effusions, new. Electronically Signed   By: Julian Hy M.D.   On: 05/02/2016 09:38     Scheduled Meds: . atorvastatin  10 mg Oral Daily  . carvedilol  12.5 mg Oral BID WC  . ceFEPime (MAXIPIME) IV  2 g Intravenous BID  . cholecalciferol   Oral Daily  . clopidogrel  75 mg Oral Q breakfast  . furosemide  40 mg Oral Daily  . insulin aspart  0-5 Units Subcutaneous QHS  . insulin aspart  0-9 Units Subcutaneous TID WC  . insulin detemir  25 Units Subcutaneous Daily  . insulin detemir  35 Units Subcutaneous QHS  . iron  polysaccharides  150 mg Oral Daily  . multivitamin with minerals  1 tablet Oral Daily  . pantoprazole  40 mg Oral Daily  . pregabalin  75 mg Oral BID  . sacubitril-valsartan  1 tablet Oral BID  . senna-docusate  1 tablet Oral BID  . spironolactone  25 mg Oral Daily  .  tamsulosin  0.4 mg Oral QPC supper  . vancomycin  750 mg Intravenous Q12H  . Warfarin - Pharmacist Dosing Inpatient   Does not apply q1800   Continuous Infusions:    LOS: 0 days   Time Spent in minutes   30 minutes  Oreta Soloway D.O. on 05/02/2016 at 2:02 PM  Between 7am to 7pm - Pager - [phone]  After 7pm go to [email or web address] - password [email or web address]  And look for the night coverage person covering for me after hours  [facility] Office  [phone]

## 2016-05-02 NOTE — Progress Notes (Signed)
Wife requesting PO Plavix be D/C'd. Per wife- pt's Cardiologist d/c'd this medication "a couple months ago after he started him on Entresto,"  Both medications already administered this shift as ordered before request was made. MD Mikhail to be made aware of request- No new orders at this time.

## 2016-05-02 NOTE — Clinical Social Work Placement (Signed)
CSW confirmed with Nira Conn at [facility] that they would be able to take patient over the weekend if ready, but SNF would need the discharge summary by 10:30am - Dr. Ree Kida aware.   Please call weekend CSW (ph#: [phone]) to facilitate discharge.      Raynaldo Opitz, [facility] Clinical Social Worker cell #: [phone]   CLINICAL SOCIAL WORK PLACEMENT  NOTE  Date:  05/02/2016  Patient Details  Name: [name] [name] MRN: [id number] Date of Birth: Apr 10, 1946  Clinical Social Work is seeking post-discharge placement for this patient at the [facility] level of care (*CSW will initial, date and re-position this form in  chart as items are completed):  Yes   Patient/family provided with [facility] Work Department's list of facilities offering this level of care within the geographic area requested by the patient (or if unable, by the patient's family).  Yes   Patient/family informed of their freedom to choose among providers that offer the needed level of care, that participate in Medicare, Medicaid or managed care program needed by the patient, have an available bed and are willing to accept the patient.  Yes   Patient/family informed of [facility]'s ownership interest in [facility] and [facility], as well as of the fact that they are under no obligation to receive care at these facilities.  PASRR submitted to EDS on       PASRR number received on       Existing PASRR number confirmed on 04/30/16     FL2 transmitted to all facilities in geographic area requested by pt/family on 04/30/16     FL2 transmitted to all facilities within larger geographic area on       Patient informed that his/her managed care company has contracts with or will negotiate with certain facilities, including the following:        Yes   Patient/family informed of bed offers received.  Patient chooses bed at  [place], [place]     Physician recommends and patient chooses bed at      Patient to be transferred to [place], [place] on  .  Patient to be transferred to facility by       Patient family notified on   of transfer.  Name of family member notified:        PHYSICIAN       Additional Comment:    _______________________________________________ Standley Brooking, LCSW 05/02/2016, 3:19 PM

## 2016-05-02 NOTE — Progress Notes (Addendum)
ANTICOAGULATION CONSULT NOTE - Follow Up Consult  Pharmacy Consult for warfarin Indication: atrial fibrillation  No Known Allergies  Patient Measurements: Height: 5\' 9"  (175.3 cm) Weight: 208 lb 1.6 oz (94.4 kg) IBW/kg (Calculated) : 70.7  Vital Signs: Temp: 98 F (36.7 C) (09/08 1316) Temp Source: Oral (09/08 1316) BP: 114/51 (09/08 1316) Pulse Rate: 81 (09/08 1316)  Labs:  Recent Labs  04/30/16 0047 04/30/16 0556 04/30/16 1153 04/30/16 1733 05/01/16 0509 05/02/16 0552  HGB 10.5* 9.9*  --   --  10.2*  --   HCT 31.2* 30.2*  --   --  31.4*  --   PLT 174 153  --   --  146*  --   LABPROT  --  33.2*  --   --  32.3* 30.5*  INR  --  3.16  --   --  3.07 2.85  CREATININE 1.84* 1.77*  --   --  1.58*  --   CKTOTAL  --  201  --   --   --   --   TROPONINI  --  <0.03 <0.03 <0.03  --   --     Estimated Creatinine Clearance: 47.9 mL/min (by C-G formula based on SCr of 1.58 mg/dL).   Assessment: 24 yoM on warfarin PTA for h/o atrial fibrillation admitted after fall at home.  Antibiotics started for possible pneumonia.  Pharmacy managing warfarin dosing inpatient.  Pt also on Plavix PTA.  PTA warfarin dose reported as 15 mg daily except takes 10 mg on MWF. LD 9/5 1800.  Today, 05/02/2016:  INR therapeutic at 2.85. INR previously supratherapeutic but trending down with reduced warfarin dosing  CBC 9/7 - Hgb low/stable, plts stable.   No major drug interactions, on plavix which can increase bleeding risk  Goal of Therapy:  INR 2-3   Plan:  - Warfarin 7.5 mg this evening. - Daily INR. - follow-up d/c plavix as RN note states cardiology has d/c'd as outpatient  Doreene Eland, PharmD, BCPS.   Pager: [phone] 05/02/2016 2:05 PM

## 2016-05-02 NOTE — Progress Notes (Signed)
Results for CLEDITH, SEIN (MRN [id number]) as of 05/02/2016 12:34  Ref. Range 05/01/2016 11:58 05/01/2016 16:54 05/01/2016 22:39 05/02/2016 07:57 05/02/2016 12:19  Glucose-Capillary Latest Ref Range: 65 - 99 mg/dL 194 (H) 218 (H) 317 (H) 213 (H) 302 (H)  CBGs continue to be elevated greater than 180 mg/dl. Recommend changing Novolog correction scale to MODERATE TID & HS. Add Novolog 4 units TID as meal coverage which will only be given if patient eats 50% or greater of meals. Harvel Ricks RN BSN CDE

## 2016-05-03 DIAGNOSIS — R531 Weakness: Secondary | ICD-10-CM | POA: Diagnosis not present

## 2016-05-03 DIAGNOSIS — E1140 Type 2 diabetes mellitus with diabetic neuropathy, unspecified: Secondary | ICD-10-CM | POA: Diagnosis not present

## 2016-05-03 DIAGNOSIS — J189 Pneumonia, unspecified organism: Secondary | ICD-10-CM | POA: Diagnosis not present

## 2016-05-03 DIAGNOSIS — I5022 Chronic systolic (congestive) heart failure: Secondary | ICD-10-CM | POA: Diagnosis not present

## 2016-05-03 LAB — BASIC METABOLIC PANEL
Anion gap: 10 (ref 5–15)
BUN: 30 mg/dL — AB (ref 6–20)
CHLORIDE: 108 mmol/L (ref 101–111)
CO2: 20 mmol/L — ABNORMAL LOW (ref 22–32)
Calcium: 8.1 mg/dL — ABNORMAL LOW (ref 8.9–10.3)
Creatinine, Ser: 1.59 mg/dL — ABNORMAL HIGH (ref 0.61–1.24)
GFR calc Af Amer: 48 mL/min — ABNORMAL LOW (ref >60)
GFR, EST NON AFRICAN AMERICAN: 42 mL/min — AB (ref >60)
Glucose, Bld: 154 mg/dL — ABNORMAL HIGH (ref 65–99)
Potassium: 4.4 mmol/L (ref 3.5–5.1)
SODIUM: 138 mmol/L (ref 135–145)

## 2016-05-03 LAB — PROTIME-INR
INR: 2.44
Prothrombin Time: 26.9 seconds — ABNORMAL HIGH (ref 11.4–15.2)

## 2016-05-03 LAB — GLUCOSE, CAPILLARY
GLUCOSE-CAPILLARY: 138 mg/dL — AB (ref 65–99)
Glucose-Capillary: 212 mg/dL — ABNORMAL HIGH (ref 65–99)

## 2016-05-03 MED ORDER — ACETAMINOPHEN 325 MG PO TABS: 650.0000 mg | ORAL_TABLET | Freq: Four times a day (QID) | ORAL | Status: AC | PRN

## 2016-05-03 MED ORDER — CEFUROXIME AXETIL 500 MG PO TABS: 500.0000 mg | ORAL_TABLET | Freq: Two times a day (BID) | ORAL | 0 refills | Status: DC

## 2016-05-03 NOTE — Discharge Summary (Signed)
Physician Discharge Summary  [name] [name] [id number] DOB: February 28, 1946 DOA: 04/30/2016  PCP: [name], MD  Admit date: 04/30/2016 Discharge date: 05/03/2016  Time spent: 45 minutes  Recommendations for Outpatient Follow-up:  Patient will be discharged to [facility] skilled nursing facility. Continue physical and occupational therapy.  Patient will need to follow up with primary care provider within one week of discharge, repeat CXR in one week.  Continue to monitor INR Patient should continue medications as prescribed.  Patient should follow a heart healthy diet. Keep right arm elevated, continue gentle range of motion and use sling.   Discharge Diagnoses:  Principal Problem:   Generalized weakness Active Problems:   NICM- EF 20-25% echo [id number]   SYSTOLIC HEART FAILURE, CHRONIC   HTN (hypertension)   PAF (paroxysmal atrial fibrillation) (HCC)   S/P bilateral BKA (below knee amputation) ([place])   Diabetes mellitus with neuropathy ([place])   HCAP (healthcare-associated pneumonia)   Weakness generalized   Discharge Condition: Stable  Diet recommendation: heart healthy/carb modified  Filed Weights   04/30/16 0005 04/30/16 0535 05/01/16 0657  Weight: 94.8 kg (209 lb) 98.9 kg (218 lb 1.6 oz) 94.4 kg (208 lb 1.6 oz)    History of present illness:  on 04/30/2016 by Dr. Girard Cooter Gloveris a 70 y.o.malewith nonischemic cardiomyopathy status post defibrillator placement last EF measured in 2014 was 20-25%, diabetes mellitus type 2, chronic kidney disease, paroxysmal atrial fibrillation was brought to the ER after patient had a fall at his house and was found to have difficulty ambulating since then. Patient wife states that patient had a fall while trying to get out of the bed but denies losing consciousness. Since the fall patient is finding it difficult to walk. CT of the head and x-ray of the pelvis are unremarkable. Chest x-ray shows possible pneumonia with  congestion. Patient was started on antibiotics for pneumonia and admitted for further management of weakness. On exam patient appears nonfocal but generally weak. As per patient wife patient has been only receiving Lasix 40 mg daily over the last 1 month since patient has been urinating more than usual. Patient is supposed to be on Lasix 40 mg twice daily.  Hospital Course:  Generalized weakness/fall -Likely secondary to deconditioning versus hypertension -Lasix currently held -Troponins cycled and negative -TSH 2.516 -CK 201 -PT consult and appreciated recommending skilled nursing facility -Orthostatic vitals (sitting/lying) were unremarkable   Questionable pneumonia/Bilateral pleural effusion -She has had cough for approximately 2-3 weeks -Per wife, patient has had some episodes of choking -Speech therapy consulted -Chest x-ray: Mild vascular congestion with cardiomegaly, mild left basilar opacity may reflect pneumonia -initially placed on on vancomycin and cefepime. Vanc discontinued.  Will discharge patient with ceftin. -Repeat CXR shows small bilateral pleural effusions- lasix restarted. No complaints of shortness of breath. Would repeat CXR in one week.  Paroxysmal atrial fibrillation -Currently in sinus rhythm, rate controlled -Continue Coumadin  -monitor INR -Continue Coreg  Nonischemic cardiomyopathy/Chronic systolic CHF -Echocardiogram in 2014 showed an EF between 25%, status post defibrillator placement -Lasix initially currently held due to weakness and fall -Continue Ernesto and spironolactone -plavix discontinued as per wife -Continue lasix  Diabetes mellitus, type II -Continue Levemir comments complaints on CBG monitoring  Chronic anemia -Hemoglobin currently 10.2 (baseline 8) -Continue to monitor CBC  Hyperlipidemia -Continue statin  Chronic kidney disease, stage III -Currently appears to be at baseline, continue to monitor BMP  Right elbow  pain/Olecranon traction spur with fracture -Obtained xray: Olecranon spur with  overlying soft tissue swelling which could represent edema or bursal collection -Patient denies trauma, although did fall prior to admission -Continue supportive care -Unlikely due to infection, currently no leukocytosis -Increased edema -Hand surgery/ortho consulted and appreciated, recommended sling and gentle ROM exercises -RUE doppler negative for DVT  Fever -Possibly secondary to pneumonia vs pleural effusions -Fever improving. Suspect may have low grade fevers until pneumonia or infectious process has resolved -Continue tylenol as needed  Consultants Orthopedic surgery  Procedures  RUE doppler  Discharge Exam: Vitals:   05/02/16 1752 05/02/16 2145  BP: 119/63 (!) 118/46  Pulse: 85 79  Resp:  18  Temp:  99.5 F (37.5 C)   Exam  General: Well developed, well nourished, NAD  HEENT: NCAT, mucous membranes moist.   Cardiovascular: S1 S2 auscultated, RRR, no murmurs  Respiratory: Diminished breath sounds, no wheezing or rhonchi  Abdomen: Soft, obese,  nontender, nondistended, + bowel sounds  Extremities:  B/L LE BKA. Edema Right elbow-improved. TTP of right elbow, improved  Neuro: AAOx3, nonfocal  Psych: Normal affect and demeanor with intact judgement and insight, pleasant  Discharge Instructions Discharge Instructions    Discharge instructions    Complete by:  As directed   Patient will be discharged to [facility] skilled nursing facility. Continue physical and occupational therapy.  Patient will need to follow up with primary care provider within one week of discharge, repeat CXR in one week.  Continue to monitor INR Patient should continue medications as prescribed.  Patient should follow a heart healthy diet.     Current Discharge Medication List    START taking these medications   Details  acetaminophen (TYLENOL) 325 MG tablet Take 2 tablets (650 mg total) by mouth every 6 (six)  hours as needed for mild pain (or Fever >/= 101).    cefUROXime (CEFTIN) 500 MG tablet Take 1 tablet (500 mg total) by mouth 2 (two) times daily with a meal. Qty: 8 tablet, Refills: 0      CONTINUE these medications which have NOT CHANGED   Details  atorvastatin (LIPITOR) 10 MG tablet Take 1 tablet (10 mg total) by mouth daily. Qty: 30 tablet, Refills: 1    carvedilol (COREG) 12.5 MG tablet Take 1 tablet (12.5 mg total) by mouth 2 (two) times daily with a meal. Qty: 60 tablet, Refills: 1    Cholecalciferol (VITAMIN D3) 10000 units TABS Take 1 tablet by mouth daily.    furosemide (LASIX) 40 MG tablet Take 1 tablet (40 mg total) by mouth daily. Qty: 30 tablet, Refills: 1    insulin detemir (LEVEMIR) 100 UNIT/ML injection 25 unit AM and 35 unit PM Qty: 10 mL, Refills: 1    iron polysaccharides (NIFEREX) 150 MG capsule Take 1 capsule (150 mg total) by mouth daily. Qty: 30 capsule, Refills: 1    Multiple Vitamin (MULTIVITAMIN WITH MINERALS) TABS tablet Take 1 tablet by mouth daily.    omeprazole (PRILOSEC) 20 MG capsule Take 20 mg by mouth daily.    potassium chloride SA (K-DUR,KLOR-CON) 20 MEQ tablet Take 1 tablet (20 mEq total) by mouth daily. Qty: 30 tablet, Refills: 1    pregabalin (LYRICA) 75 MG capsule Take 1 capsule (75 mg total) by mouth 2 (two) times daily. Qty: 60 capsule, Refills: 1    sacubitril-valsartan (ENTRESTO) 24-26 MG Take 1 tablet by mouth 2 (two) times daily. Qty: 60 tablet, Refills: 6    senna-docusate (SENOKOT-S) 8.6-50 MG per tablet Take 1 tablet by mouth 2 (two) times daily. Qty:  60 tablet, Refills: 1    spironolactone (ALDACTONE) 25 MG tablet Take 25 mg by mouth daily. TAKES HALF A TABLET    tamsulosin (FLOMAX) 0.4 MG CAPS capsule Take 1 capsule (0.4 mg total) by mouth daily after supper. Qty: 30 capsule, Refills: 1    warfarin (COUMADIN) 10 MG tablet Take 10 mg by mouth daily. Take 10 mg Mon, Wed , Friday .  Take 15mg  on Sunday, tues, Thurs and Sat        STOP taking these medications     clopidogrel (PLAVIX) 75 MG tablet        No Known Allergies Follow-up Information    GATES,ROBERT NEVILL, MD. Schedule an appointment as soon as possible for a visit in 1 week(s).   Specialty:  Internal Medicine Why:  Hospital follow up Contact information: [address] [place] [address] [phone]            The results of significant diagnostics from this hospitalization (including imaging, microbiology, ancillary and laboratory) are listed below for reference.    Significant Diagnostic Studies: Dg Chest 2 View  Result Date: 05/02/2016 CLINICAL DATA:  Fever EXAM: CHEST  2 VIEW COMPARISON:  04/30/2016 FINDINGS: Lungs are essentially clear. No frank interstitial edema. Mild left basilar atelectasis. Small bilateral pleural effusions, new. Cardiomegaly.  Left subclavian ICD. Visualized osseous structures are within normal limits. IMPRESSION: Small bilateral pleural effusions, new. Electronically Signed   By: Julian Hy M.D.   On: 05/02/2016 09:38   Dg Chest 2 View  Result Date: 04/30/2016 CLINICAL DATA:  Acute onset of cough and generalized weakness. Falls, with body aches. Initial encounter. EXAM: CHEST  2 VIEW COMPARISON:  Chest radiograph performed 12/27/2015 FINDINGS: The lungs are well-aerated. Mild vascular congestion is noted. Mild left basilar opacity may reflect mild pneumonia. There is no evidence of pleural effusion or pneumothorax. The heart is mildly enlarged. A pacemaker/AICD is noted overlying the left chest wall, with leads ending overlying the right atrium, right ventricle and coronary sinus. No acute osseous abnormalities are seen. IMPRESSION: Mild vascular congestion and mild cardiomegaly. Mild left basilar opacity may reflect pneumonia. Electronically Signed   By: Garald Balding M.D.   On: 04/30/2016 01:13   Dg Pelvis 1-2 Views  Result Date: 04/30/2016 CLINICAL DATA:  Status post multiple falls.  EXAM: PELVIS - 1-2 VIEW COMPARISON:  Pelvic radiograph 12/28/2015 FINDINGS: Left total hip arthroplasty without abnormal lucency surrounding the components. No periprosthetic fracture. Limited views of the right hip are normal. No visible pelvic fracture. IMPRESSION: 1. Left total hip arthroplasty without periprosthetic fracture. 2. No pelvic fracture. Electronically Signed   By: Ulyses Jarred M.D.   On: 04/30/2016 03:20   Dg Elbow Complete Right (3+view)  Result Date: 04/30/2016 CLINICAL DATA:  RIGHT olecranon pain for 2 days, no injury EXAM: RIGHT ELBOW - COMPLETE 3+ VIEW COMPARISON:  None FINDINGS: Mild diffuse osseous demineralization. Elbow flexed on all images. Small lateral epicondylar spur. Prominent olecranon spur with overlying soft tissue swelling. No acute fracture, dislocation or bone destruction. No definite joint effusion. Extensive atherosclerotic calcifications. IMPRESSION: Olecranon spur with overlying soft tissue swelling which could represent edema or an olecranon bursal collection which can result from hemorrhage, inflammation, or infection. Remainder of exam unremarkable. Electronically Signed   By: Lavonia Dana M.D.   On: 04/30/2016 12:21   Ct Head Wo Contrast  Result Date: 04/30/2016 CLINICAL DATA:  Status post fall. EXAM: CT HEAD WITHOUT CONTRAST TECHNIQUE: Contiguous axial images were  obtained from the base of the skull through the vertex without intravenous contrast. COMPARISON:  Head CT 12/27/2015 FINDINGS: Brain: No mass lesion, intraparenchymal hemorrhage or extra-axial collection. No evidence of acute cortical infarct. Hypoattenuation within the posterior right frontal lobe is unchanged. Mild periventricular hypoattenuation compatible with chronic microvascular disease is again seen. Unchanged mineralization in the brainstem. Vascular: Atherosclerotic calcification of the internal carotid artery is and vertebral arteries is present. Skull: Normal visualized skull base, calvarium  and extracranial soft tissues. Sinuses/Orbits: No sinus fluid levels or advanced mucosal thickening. No mastoid effusion. Normal orbits. IMPRESSION: 1. No acute intracranial abnormality. 2. 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 Electronically Signed   By: Ulyses Jarred M.D.   On: 04/30/2016 03:08    Microbiology: Recent Results (from the past 240 hour(s))  Blood Culture (routine x 2)     Status: None (Preliminary result)   Collection Time: 04/30/16 12:50 AM  Result Value Ref Range Status   Specimen Description BLOOD RIGHT WRIST  Final   Special Requests BOTTLES DRAWN AEROBIC AND ANAEROBIC 10CC  Final   Culture   Final    NO GROWTH 2 DAYS Performed at [facility]    Report Status PENDING  Incomplete  Blood Culture (routine x 2)     Status: None (Preliminary result)   Collection Time: 04/30/16  1:08 AM  Result Value Ref Range Status   Specimen Description BLOOD LEFT ANTECUBITAL  Final   Special Requests BOTTLES DRAWN AEROBIC AND ANAEROBIC 5CC  Final   Culture   Final    NO GROWTH 2 DAYS Performed at [facility]    Report Status PENDING  Incomplete  Urine culture     Status: None   Collection Time: 04/30/16  2:18 AM  Result Value Ref Range Status   Specimen Description URINE, CATHETERIZED  Final   Special Requests NONE  Final   Culture NO GROWTH Performed at [facility]   Final   Report Status 05/01/2016 FINAL  Final     Labs: Basic Metabolic Panel:  Recent Labs Lab 04/30/16 0047 04/30/16 0556 05/01/16 0509 05/03/16 0523  NA 136 137 138 138  K 4.6 4.2 3.9 4.4  CL 105 107 108 108  CO2 23 23 23  20*  GLUCOSE 253* 257* 191* 154*  BUN 35* 37* 30* 30*  CREATININE 1.84* 1.77* 1.58* 1.59*  CALCIUM 8.5* 8.3* 8.4* 8.1*   Liver Function Tests:  Recent Labs Lab 04/30/16 0047 04/30/16 0556  AST 18 17  ALT 16* 15*  ALKPHOS 54 54  BILITOT 0.4 0.6  PROT 7.1 6.5  ALBUMIN 3.4* 3.0*   No results for  input(s): LIPASE, AMYLASE in the last 168 hours. No results for input(s): AMMONIA in the last 168 hours. CBC:  Recent Labs Lab 04/30/16 0047 04/30/16 0556 05/01/16 0509  WBC 7.3 5.3 5.2  NEUTROABS 6.1 4.0  --   HGB 10.5* 9.9* 10.2*  HCT 31.2* 30.2* 31.4*  MCV 72.6* 72.9* 73.5*  PLT 174 153 146*   Cardiac Enzymes:  Recent Labs Lab 04/30/16 0556 04/30/16 1153 04/30/16 1733  CKTOTAL 201  --   --   TROPONINI <0.03 <0.03 <0.03   BNP: BNP (last 3 results)  Recent Labs  12/28/15 0550  BNP 380.3*    ProBNP (last 3 results) No results for input(s): PROBNP in the last 8760 hours.  CBG:  Recent Labs Lab 05/02/16 0757 05/02/16 1219 05/02/16 1719 05/02/16 2148 05/03/16 0733  GLUCAP  213* 302* 257* 288* 138*       Signed:  Tanita Palinkas  [facility] 05/03/2016, 8:30 AM

## 2016-05-03 NOTE — Clinical Social Work Placement (Addendum)
   CLINICAL SOCIAL WORK PLACEMENT  NOTE 05/03/16 - DISCHARGED TO [facility]   Date:  05/03/2016  Patient Details  Name: [name] [name] MRN: [id number] Date of Birth: 1945/10/12  Clinical Social Work is seeking post-discharge placement for this patient at the [place] level of care (*CSW will initial, date and re-position this form in  chart as items are completed):  Yes   Patient/family provided with [facility] Work Department's list of facilities offering this level of care within the geographic area requested by the patient (or if unable, by the patient's family).  Yes   Patient/family informed of their freedom to choose among providers that offer the needed level of care, that participate in Medicare, Medicaid or managed care program needed by the patient, have an available bed and are willing to accept the patient.  Yes   Patient/family informed of [facility]'s ownership interest in [facility] and [facility], as well as of the fact that they are under no obligation to receive care at these facilities.  PASRR submitted to EDS on       PASRR number received on       Existing PASRR number confirmed on 04/30/16     FL2 transmitted to all facilities in geographic area requested by pt/family on 04/30/16     FL2 transmitted to all facilities within larger geographic area on       Patient informed that his/her managed care company has contracts with or will negotiate with certain facilities, including the following:        Yes   Patient/family informed of bed offers received.  Patient chooses bed at [place], [place]     Physician recommends and patient chooses bed at      Patient to be transferred to [place], [place] on  .  Patient to be transferred to facility by  family - wife.     Patient family notified on  05/03/16 of transfer.  Name of family member notified:   Spouse Psychologist, counselling by nurse.     PHYSICIAN       Additional Comment:    _______________________________________________ Sable Feil, LCSW 05/03/2016, 4:04 PM

## 2016-05-03 NOTE — Discharge Instructions (Signed)

## 2016-05-03 NOTE — Progress Notes (Signed)
ANTICOAGULATION CONSULT NOTE - Follow Up Consult  Pharmacy Consult for warfarin Indication: atrial fibrillation  No Known Allergies  Patient Measurements: Height: 5\' 9"  (175.3 cm) Weight: 208 lb 1.6 oz (94.4 kg) IBW/kg (Calculated) : 70.7  Vital Signs: BP: 110/57 (09/09 0833) Pulse Rate: 86 (09/09 0833)  Labs:  Recent Labs  04/30/16 1153 04/30/16 1733 05/01/16 0509 05/02/16 0552 05/03/16 0523  HGB  --   --  10.2*  --   --   HCT  --   --  31.4*  --   --   PLT  --   --  146*  --   --   LABPROT  --   --  32.3* 30.5* 26.9*  INR  --   --  3.07 2.85 2.44  CREATININE  --   --  1.58*  --  1.59*  TROPONINI <0.03 <0.03  --   --   --     Estimated Creatinine Clearance: 47.6 mL/min (by C-G formula based on SCr of 1.59 mg/dL).   Assessment: 72 yoM on warfarin PTA for h/o atrial fibrillation admitted after fall at home.  Antibiotics started for possible pneumonia.  Pharmacy managing warfarin dosing inpatient.  Pt also on Plavix PTA.  PTA warfarin dose reported as 15 mg daily except takes 10 mg on MWF. LD 9/5 1800.  Today, 05/03/2016:  INR therapeutic at 2.44. INR previously supratherapeutic but trending down with reduced warfarin dosing  CBC 9/7 - Hgb low/stable, plts stable.   No major drug interactions, plavix stopped as wife states cards has stopped this mediation  Goal of Therapy:  INR 2-3   Plan:  - Orders to d/c to SNF today on previous warfarin regimen.  This appears to be appropriate based on INR trend using lower warfarin dose - Daily INR.  Doreene Eland, PharmD, BCPS.   Pager: [phone] 05/03/2016 10:55 AM

## 2016-05-03 NOTE — Progress Notes (Signed)
Discharge instructions reviewed with patient and En[place] Derry (Wife). Patient being discharged to [place] via private vehicle.

## 2016-05-05 LAB — CULTURE, BLOOD (ROUTINE X 2)
Culture: NO GROWTH
Culture: NO GROWTH

## 2016-05-12 ENCOUNTER — Telehealth: Payer: Self-pay | Admitting: Cardiology

## 2016-05-12 ENCOUNTER — Encounter: Payer: Medicare Other | Admitting: *Deleted

## 2016-05-12 NOTE — Telephone Encounter (Signed)
Confirmed remote transmission w/ pt guardian. She stated that pt is in a facility right now healing from pneumonia and a fall. He will be home in a couple of weeks. Pt will send transmission when he is home.

## 2016-05-16 ENCOUNTER — Encounter: Payer: Self-pay | Admitting: Cardiology

## 2016-05-27 ENCOUNTER — Ambulatory Visit (INDEPENDENT_AMBULATORY_CARE_PROVIDER_SITE_OTHER): Payer: Medicare Other | Admitting: Interventional Cardiology

## 2016-05-27 DIAGNOSIS — Z5181 Encounter for therapeutic drug level monitoring: Secondary | ICD-10-CM

## 2016-05-27 LAB — PROTIME-INR: INR: 2.2 — AB (ref 0.9–1.1)

## 2016-06-02 ENCOUNTER — Ambulatory Visit (INDEPENDENT_AMBULATORY_CARE_PROVIDER_SITE_OTHER): Payer: Medicare Other | Admitting: *Deleted

## 2016-06-02 DIAGNOSIS — Z95810 Presence of automatic (implantable) cardiac defibrillator: Secondary | ICD-10-CM

## 2016-06-04 ENCOUNTER — Ambulatory Visit (INDEPENDENT_AMBULATORY_CARE_PROVIDER_SITE_OTHER): Payer: Medicare Other | Admitting: Internal Medicine

## 2016-06-04 DIAGNOSIS — Z5181 Encounter for therapeutic drug level monitoring: Secondary | ICD-10-CM

## 2016-06-04 LAB — PROTIME-INR: INR: 1.2 — AB (ref 0.9–1.1)

## 2016-06-11 ENCOUNTER — Ambulatory Visit (INDEPENDENT_AMBULATORY_CARE_PROVIDER_SITE_OTHER): Payer: Medicare Other | Admitting: Cardiology

## 2016-06-11 DIAGNOSIS — Z5181 Encounter for therapeutic drug level monitoring: Secondary | ICD-10-CM

## 2016-06-11 LAB — POCT INR: INR: 2.2

## 2016-06-13 NOTE — Progress Notes (Signed)
Remote CRT-D check.

## 2016-06-25 ENCOUNTER — Ambulatory Visit (INDEPENDENT_AMBULATORY_CARE_PROVIDER_SITE_OTHER): Payer: Medicare Other | Admitting: Pharmacist

## 2016-06-25 DIAGNOSIS — Z5181 Encounter for therapeutic drug level monitoring: Secondary | ICD-10-CM

## 2016-06-25 LAB — POCT INR: INR: 1.8

## 2016-06-27 ENCOUNTER — Telehealth: Payer: Self-pay | Admitting: *Deleted

## 2016-06-27 NOTE — Telephone Encounter (Signed)
Received documentation that the pt was discharged from [facility] today. Therefore, called pt to schedule next CVRR appt in office but had to leave a message for him to call back to schedule.

## 2016-07-01 LAB — CUP PACEART REMOTE DEVICE CHECK
Brady Statistic RA Percent Paced: 0 %
HighPow Impedance: 42 Ohm
Implantable Lead Location: 753858
Implantable Lead Location: 753859
Implantable Lead Model: 158
Implantable Lead Model: 4513
Implantable Lead Serial Number: 406736
Lead Channel Impedance Value: 515 Ohm
Lead Channel Pacing Threshold Amplitude: 1.4 V
Lead Channel Pacing Threshold Pulse Width: 0.4 ms
Lead Channel Pacing Threshold Pulse Width: 0.4 ms
MDC IDC LEAD IMPLANT DT: 20050429
MDC IDC LEAD IMPLANT DT: 20050429
MDC IDC LEAD IMPLANT DT: 20050429
MDC IDC LEAD LOCATION: 753860
MDC IDC LEAD SERIAL: 154821
MDC IDC MSMT LEADCHNL LV IMPEDANCE VALUE: 493 Ohm
MDC IDC MSMT LEADCHNL RA PACING THRESHOLD AMPLITUDE: 0.5 V
MDC IDC MSMT LEADCHNL RA SENSING INTR AMPL: 5.6 mV
MDC IDC MSMT LEADCHNL RV PACING THRESHOLD AMPLITUDE: 0.9 V
MDC IDC MSMT LEADCHNL RV PACING THRESHOLD PULSEWIDTH: 0.4 ms
MDC IDC PG IMPLANT DT: 20101111
MDC IDC PG SERIAL: 587847
MDC IDC SESS DTM: 20171107130014
MDC IDC STAT BRADY RV PERCENT PACED: 99 %

## 2016-07-09 ENCOUNTER — Ambulatory Visit (INDEPENDENT_AMBULATORY_CARE_PROVIDER_SITE_OTHER): Payer: Medicare Other | Admitting: *Deleted

## 2016-07-09 DIAGNOSIS — I4891 Unspecified atrial fibrillation: Secondary | ICD-10-CM | POA: Diagnosis not present

## 2016-07-09 DIAGNOSIS — Z5181 Encounter for therapeutic drug level monitoring: Secondary | ICD-10-CM | POA: Diagnosis not present

## 2016-07-09 LAB — POCT INR: INR: 1.1

## 2016-07-15 ENCOUNTER — Ambulatory Visit (INDEPENDENT_AMBULATORY_CARE_PROVIDER_SITE_OTHER): Payer: Medicare Other

## 2016-07-15 DIAGNOSIS — Z5181 Encounter for therapeutic drug level monitoring: Secondary | ICD-10-CM | POA: Diagnosis not present

## 2016-07-15 DIAGNOSIS — I4891 Unspecified atrial fibrillation: Secondary | ICD-10-CM

## 2016-07-15 LAB — POCT INR: INR: 2.2

## 2016-07-24 ENCOUNTER — Other Ambulatory Visit: Payer: Self-pay | Admitting: Internal Medicine

## 2016-07-29 ENCOUNTER — Ambulatory Visit (INDEPENDENT_AMBULATORY_CARE_PROVIDER_SITE_OTHER): Payer: Medicare Other | Admitting: *Deleted

## 2016-07-29 DIAGNOSIS — I4891 Unspecified atrial fibrillation: Secondary | ICD-10-CM | POA: Diagnosis not present

## 2016-07-29 DIAGNOSIS — Z5181 Encounter for therapeutic drug level monitoring: Secondary | ICD-10-CM | POA: Diagnosis not present

## 2016-07-29 LAB — POCT INR: INR: 2

## 2016-07-30 ENCOUNTER — Telehealth: Payer: Self-pay | Admitting: *Deleted

## 2016-07-30 NOTE — Telephone Encounter (Signed)
Spoke with patient, requested that he send a manual transmission from his home monitor as it has become disconnected.  Patient requests that I call back later this week to speak with his wife as she knows how to use his monitor.  He also requests that I speak with her to schedule appointments.  She is currently hospitalized, but should be discharged later today per patient.  Will call back.

## 2016-08-05 ENCOUNTER — Ambulatory Visit (INDEPENDENT_AMBULATORY_CARE_PROVIDER_SITE_OTHER): Payer: Medicare Other | Admitting: *Deleted

## 2016-08-05 DIAGNOSIS — Z5181 Encounter for therapeutic drug level monitoring: Secondary | ICD-10-CM

## 2016-08-05 DIAGNOSIS — I4891 Unspecified atrial fibrillation: Secondary | ICD-10-CM | POA: Diagnosis not present

## 2016-08-05 DIAGNOSIS — I428 Other cardiomyopathies: Secondary | ICD-10-CM

## 2016-08-05 LAB — POCT INR: INR: 4.6

## 2016-08-08 NOTE — Progress Notes (Signed)
Remote ICD transmission.   

## 2016-08-08 NOTE — Telephone Encounter (Signed)
Manual transmission received on 08/05/16, added to remote transmission schedule.

## 2016-08-12 ENCOUNTER — Ambulatory Visit (INDEPENDENT_AMBULATORY_CARE_PROVIDER_SITE_OTHER): Payer: Medicare Other

## 2016-08-12 DIAGNOSIS — I4891 Unspecified atrial fibrillation: Secondary | ICD-10-CM

## 2016-08-12 DIAGNOSIS — Z5181 Encounter for therapeutic drug level monitoring: Secondary | ICD-10-CM

## 2016-08-12 LAB — POCT INR: INR: 4.8

## 2016-08-13 ENCOUNTER — Encounter: Payer: Self-pay | Admitting: Cardiology

## 2016-08-21 ENCOUNTER — Ambulatory Visit (INDEPENDENT_AMBULATORY_CARE_PROVIDER_SITE_OTHER): Payer: Medicare Other | Admitting: *Deleted

## 2016-08-21 DIAGNOSIS — I4891 Unspecified atrial fibrillation: Secondary | ICD-10-CM | POA: Diagnosis not present

## 2016-08-21 DIAGNOSIS — Z5181 Encounter for therapeutic drug level monitoring: Secondary | ICD-10-CM

## 2016-08-21 LAB — POCT INR: INR: 2.2

## 2016-09-01 LAB — CUP PACEART REMOTE DEVICE CHECK
Battery Remaining Longevity: 42 mo
Brady Statistic RA Percent Paced: 0 %
HighPow Impedance: 41 Ohm
Implantable Lead Implant Date: 20050429
Implantable Lead Location: 753858
Implantable Lead Location: 753859
Implantable Lead Model: 158
Implantable Lead Model: 4513
Implantable Lead Model: 5076
Implantable Lead Serial Number: 406736
Implantable Pulse Generator Implant Date: 20101111
Lead Channel Impedance Value: 438 Ohm
Lead Channel Impedance Value: 474 Ohm
Lead Channel Impedance Value: 508 Ohm
Lead Channel Pacing Threshold Amplitude: 0.5 V
Lead Channel Pacing Threshold Amplitude: 0.9 V
Lead Channel Pacing Threshold Amplitude: 1.4 V
Lead Channel Setting Pacing Amplitude: 2.4 V
Lead Channel Setting Pacing Pulse Width: 0.4 ms
Lead Channel Setting Sensing Sensitivity: 0.5 mV
Lead Channel Setting Sensing Sensitivity: 1 mV
MDC IDC LEAD IMPLANT DT: 20050429
MDC IDC LEAD IMPLANT DT: 20050429
MDC IDC LEAD LOCATION: 753860
MDC IDC LEAD SERIAL: 154821
MDC IDC MSMT BATTERY REMAINING PERCENTAGE: 61 %
MDC IDC MSMT LEADCHNL LV PACING THRESHOLD PULSEWIDTH: 0.4 ms
MDC IDC MSMT LEADCHNL RA PACING THRESHOLD PULSEWIDTH: 0.4 ms
MDC IDC MSMT LEADCHNL RV PACING THRESHOLD PULSEWIDTH: 0.4 ms
MDC IDC PG SERIAL: 587847
MDC IDC SESS DTM: 20171212225700
MDC IDC SET LEADCHNL LV PACING PULSEWIDTH: 0.4 ms
MDC IDC SET LEADCHNL RA PACING AMPLITUDE: 2 V
MDC IDC SET LEADCHNL RV PACING AMPLITUDE: 2.4 V
MDC IDC STAT BRADY RV PERCENT PACED: 99 %

## 2016-09-11 ENCOUNTER — Encounter: Payer: Medicare Other | Admitting: Internal Medicine

## 2016-09-12 ENCOUNTER — Encounter: Payer: Self-pay | Admitting: Internal Medicine

## 2016-09-12 ENCOUNTER — Ambulatory Visit (INDEPENDENT_AMBULATORY_CARE_PROVIDER_SITE_OTHER): Payer: Medicare Other | Admitting: Internal Medicine

## 2016-09-12 ENCOUNTER — Ambulatory Visit (INDEPENDENT_AMBULATORY_CARE_PROVIDER_SITE_OTHER): Payer: Medicare Other | Admitting: *Deleted

## 2016-09-12 VITALS — BP 126/64 | HR 91 | Ht 69.0 in | Wt 221.4 lb

## 2016-09-12 DIAGNOSIS — I4891 Unspecified atrial fibrillation: Secondary | ICD-10-CM

## 2016-09-12 DIAGNOSIS — I428 Other cardiomyopathies: Secondary | ICD-10-CM | POA: Diagnosis not present

## 2016-09-12 DIAGNOSIS — I5022 Chronic systolic (congestive) heart failure: Secondary | ICD-10-CM | POA: Diagnosis not present

## 2016-09-12 DIAGNOSIS — I480 Paroxysmal atrial fibrillation: Secondary | ICD-10-CM | POA: Diagnosis not present

## 2016-09-12 DIAGNOSIS — Z5181 Encounter for therapeutic drug level monitoring: Secondary | ICD-10-CM

## 2016-09-12 DIAGNOSIS — Z95810 Presence of automatic (implantable) cardiac defibrillator: Secondary | ICD-10-CM | POA: Diagnosis not present

## 2016-09-12 LAB — CUP PACEART INCLINIC DEVICE CHECK
HighPow Impedance: 36 Ohm
HighPow Impedance: 40 Ohm
Implantable Lead Implant Date: 20050429
Implantable Lead Location: 753858
Implantable Lead Location: 753860
Implantable Lead Model: 158
Implantable Lead Model: 5076
Implantable Lead Serial Number: 154821
Lead Channel Impedance Value: 511 Ohm
Lead Channel Pacing Threshold Amplitude: 1.4 V
Lead Channel Pacing Threshold Pulse Width: 0.4 ms
Lead Channel Pacing Threshold Pulse Width: 0.4 ms
Lead Channel Sensing Intrinsic Amplitude: 10.2 mV
Lead Channel Sensing Intrinsic Amplitude: 4.2 mV
Lead Channel Setting Pacing Amplitude: 2 V
Lead Channel Setting Pacing Pulse Width: 0.4 ms
Lead Channel Setting Pacing Pulse Width: 0.4 ms
Lead Channel Setting Sensing Sensitivity: 1 mV
MDC IDC LEAD IMPLANT DT: 20050429
MDC IDC LEAD IMPLANT DT: 20050429
MDC IDC LEAD LOCATION: 753859
MDC IDC LEAD SERIAL: 406736
MDC IDC MSMT LEADCHNL LV IMPEDANCE VALUE: 460 Ohm
MDC IDC MSMT LEADCHNL LV SENSING INTR AMPL: 11.1 mV
MDC IDC MSMT LEADCHNL RA PACING THRESHOLD AMPLITUDE: 0.9 V
MDC IDC MSMT LEADCHNL RV IMPEDANCE VALUE: 443 Ohm
MDC IDC MSMT LEADCHNL RV PACING THRESHOLD AMPLITUDE: 0.7 V
MDC IDC MSMT LEADCHNL RV PACING THRESHOLD PULSEWIDTH: 0.4 ms
MDC IDC PG IMPLANT DT: 20101111
MDC IDC SESS DTM: 20180119050000
MDC IDC SET LEADCHNL LV PACING AMPLITUDE: 2.4 V
MDC IDC SET LEADCHNL RV PACING AMPLITUDE: 2.4 V
MDC IDC SET LEADCHNL RV SENSING SENSITIVITY: 0.5 mV
Pulse Gen Serial Number: 587847

## 2016-09-12 LAB — POCT INR: INR: 2.7

## 2016-09-12 NOTE — Patient Instructions (Addendum)
Medication Instructions: - Your physician has recommended you make the following change in your medication: 1) Increase lasix (furosemide) 40 mg- take 2 tablets (80 mg) by mouth twice daily x 4 days, then resume one tablet (40 mg) once daily  Labwork: - Your physician recommends that you have lab work today: [facility]/ [facility] recommends that you return for lab work: Friday 09/19/16- [facility] (the lab is open from 7:30am- 4:45pm)   Procedures/Testing: - none ordered  Follow-Up: - Your physician recommends that you schedule a follow-up appointment in: 2 weeks with [facility]- NP for Dr. Caryl Comes.   Any Additional Special Instructions Will Be Listed Below (If Applicable).     If you need a refill on your cardiac medications before your next appointment, please call your pharmacy.

## 2016-09-12 NOTE — Progress Notes (Signed)
Patient Care Team: Josetta Huddle, MD as PCP - General (Internal Medicine)   HPI  [name] [name] is a 71 y.o. male  seen as a followup as per MADIT-CRT trial implanted for a nonischemic cardiomyopathy with a CRT-D device. He is status post generator replacement   He has intercurrently undergone bilateral BKA and is walking with bilateral prosthetic limbs    He has had pneumonia and bronchitis over the fall and has been having sigificant SOB over the last few weeks with some increased weight and abdominal distension   He has had PND and DOE  NO change in diet or meds  Echocardiogram 6/14 demonstrated ejection fraction of 25%   Chart reviewed  ARB stopped apparentnly inadvertnetly following ortho admission 2/15    9/17 Cr 1.59  Hgb   10.2 (MCV 73)    Past Medical History:  Diagnosis Date  . Anemia    takes Iron pill daily  . Arthritis   . Automatic implantable cardioverter-defibrillator in situ    a. 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  . CAD (coronary artery disease)   . Cardiomyopathy, nonischemic ([place])    a. Cath 2003: mild nonobstructive CAD, EF 25% at that time.  . Chronic systolic CHF (congestive heart failure) (HCC)    takes Furosemide daily as well as Aldactone  . CKD (chronic kidney disease), stage III   . Complication of anesthesia   . Constipation    takes Sennokot daily  . Dementia   . GERD (gastroesophageal reflux disease)    takes Nexium daily  . Headache    occasionally  . High cholesterol    takes Atorvastatin daily  . History of blood transfusion    no abnormal reaction noted  . History of bronchitis    > 1 yr ago  . History of kidney stones   . Hypertension    takes Coreg daily  . LBBB (left bundle branch block)    takes Coumadin daily  . NSVT (nonsustained ventricular tachycardia) ([place])    a. Noted on ICD interrogation in 2011.  [name] PAD (peripheral artery disease) ([place])    a. 08/2013 Periph  Angio/PTA: Abd Ao nl, RLE- 3v runoff, PT diff dzs, AT 90p, LLE 2v runoff, PT 100, AT 13m (diamondback ORA/chocolate balloon PTA).  [name] PAF (paroxysmal atrial fibrillation) ([place])    a. Noted on ICD interrogation 2012;  b. coumadin d/c'd 01/2013.  [name] PAF (paroxysmal atrial fibrillation) ([place])   . Peripheral neuropathy (HCC)    hands;numbness and tingling   . Pneumonia    hx of > 29yr ago  . PONV (postoperative nausea and vomiting)   . Type II diabetes mellitus ([place])    takes Levemir daily as well as Novolog  . Urinary frequency   . Urinary urgency     Past Surgical History:  Procedure Laterality Date  . ABDOMINAL ANGIOGRAM  09/12/2013   Procedure: ABDOMINAL ANGIOGRAM;  Surgeon: Lorretta Harp, MD;  Location: [facility] CATH LAB;  Service: Cardiovascular;;  . AMPUTATION Left 10/04/2013   Procedure: LEFT GREAT TOE AND SECOND TOE AMPUTATION;  Surgeon: Marianna Payment, MD;  Location: [place];  Service: Orthopedics;  Laterality: Left;  . AMPUTATION Left 11/09/2013   Procedure: LEFT AMPUTATION BELOW KNEE;  Surgeon: Marianna Payment, MD;  Location: [place];  Service: Orthopedics;  Laterality: Left;  . AMPUTATION Right 01/04/2014   Procedure: Doristine Devoid second and third toe amputation;  Surgeon: Marianna Payment, MD;  Location: [place];  Service: Orthopedics;  Laterality: Right;  . AMPUTATION Right 01/30/2014   Procedure: RIGHT BELOW KNEE AMPUTATION;  Surgeon: Marianna Payment, MD;  Location: [place];  Service: Orthopedics;  Laterality: Right;  . ATHERECTOMY Right 12/29/2013   Procedure: ATHERECTOMY;  Surgeon: Lorretta Harp, MD;  Location: [facility] CATH LAB;  Service: Cardiovascular;  Laterality: Right;  Anterior Tibial Artery  . CARDIAC CATHETERIZATION  10/01/2001   THERE WAS GLOBAL HYPOKINESIS AND EF 25%. THERE APPEARED TO BE GLOBAL DECREASE IN WALL MOTION  . CARDIAC DEFIBRILLATOR PLACEMENT  06/2009   WITH GENERATOR REPLACED; BiV ICD  . CARDIOVASCULAR STRESS TEST  03/20/2009   EF 33%  . [place]    . COLONOSCOPY    . ESOPHAGOGASTRODUODENOSCOPY    . HIP ARTHROPLASTY Left 12/28/2015   Procedure: POSTERIOR  APPROACH HEMI HIP ARTHROPLASTY;  Surgeon: Leandrew Koyanagi, MD;  Location: [place];  Service: Orthopedics;  Laterality: Left;  . LEG AMPUTATION BELOW KNEE Left 11/09/2013   DR Erlinda Hong  . LITHOTRIPSY  2001  . LOWER EXTREMITY ANGIOGRAM Bilateral 09/12/2013   Procedure: LOWER EXTREMITY ANGIOGRAM;  Surgeon: Lorretta Harp, MD;  Location: [facility] CATH LAB;  Service: Cardiovascular;  Laterality: Bilateral;  . LOWER EXTREMITY ANGIOGRAM N/A 12/29/2013   Procedure: LOWER EXTREMITY ANGIOGRAM;  Surgeon: Lorretta Harp, MD;  Location: [facility] CATH LAB;  Service: Cardiovascular;  Laterality: N/A;  . STUMP REVISION Left 01/03/2015   Procedure: LEFT BELOW KNEE AMPUTATION REVISION;  Surgeon: Leandrew Koyanagi, MD;  Location: [place];  Service: Orthopedics;  Laterality: Left;  . TOE AMPUTATION  10/04/2013   LEFT GREAT TOE AND 4TH TOE   /   DR Erlinda Hong  . TRANSLUMINAL ATHERECTOMY TIBIAL ARTERY Left 09/12/2013  . US ECHOCARDIOGRAPHY  03/21/2008   EF 30-35%    Current Outpatient Prescriptions  Medication Sig Dispense Refill  . acetaminophen (TYLENOL) 325 MG tablet Take 2 tablets (650 mg total) by mouth every 6 (six) hours as needed for mild pain (or Fever >/= 101).    [name] aspirin EC 81 MG tablet Take 81 mg by mouth daily.    [name] atorvastatin (LIPITOR) 10 MG tablet Take 1 tablet (10 mg total) by mouth daily. 30 tablet 1  . carvedilol (COREG) 12.5 MG tablet Take 1 tablet (12.5 mg total) by mouth 2 (two) times daily with a meal. 60 tablet 1  . Cholecalciferol (VITAMIN D3) 10000 units TABS Take 1 tablet by mouth daily.    . furosemide (LASIX) 40 MG tablet Take 1 tablet (40 mg total) by mouth daily. 30 tablet 1  . insulin detemir (LEVEMIR) 100 UNIT/ML injection 25 unit AM and 35 unit PM (Patient taking differently: Inject 25-35 Units into the skin See admin instructions. 25 unit AM and 35 unit PM) 10 mL 1  . iron polysaccharides (NIFEREX) 150 MG  capsule Take 1 capsule (150 mg total) by mouth daily. 30 capsule 1  . Multiple Vitamin (MULTIVITAMIN WITH MINERALS) TABS tablet Take 1 tablet by mouth daily.    . Multiple Vitamin (MULTIVITAMIN) tablet Take 1 tablet by mouth daily.    [name] omeprazole (PRILOSEC) 20 MG capsule Take 20 mg by mouth daily.    . pregabalin (LYRICA) 75 MG capsule Take 1 capsule (75 mg total) by mouth 2 (two) times daily. 60 capsule 1  . sacubitril-valsartan (ENTRESTO) 24-26 MG Take 1 tablet by mouth 2 (two) times daily. 60 tablet 6  . senna-docusate (SENOKOT-S) 8.6-50 MG per tablet Take  1 tablet by mouth 2 (two) times daily. 60 tablet 1  . warfarin (COUMADIN) 10 MG tablet Take 10 mg by mouth daily. Take 10 mg Mon, Wed , Friday .  Take 15mg  on Sunday, tues, Thurs and Sat    . warfarin (COUMADIN) 10 MG tablet TAKE AS DIRECTED BY COUMADIN CLINIC 100 tablet 0   No current facility-administered medications for this visit.     No Known Allergies  Review of Systems negative except from HPI and PMH  Physical Exam BP 126/64   Pulse 91   Ht 5\' 9"  (1.753 m)   Wt 221 lb 6.4 oz (100.4 kg)   SpO2 98%   BMI 32.70 kg/m  Well developed and nourished in mild resp distress with audible wheezing  HENT normal Neck supple with JVP 10 +HJR Clear Device pocket well healed; without hematoma or erythema.  There is no tethering Regular rate and rhythm, 2/6 murmur Abd-soft with active BS No Clubbing cyanosis B prothesis Skin-warm and dry A & Oriented  Grossly normal sensory and motor function   ECG demonstrates P synchronous pacing with + QRS V1 and - Lead 1   Assessment and  Plan  Atrial fibrillation-paroxysmal  Congestive heart failure-acute//chronic-systolic  Nonischemic cardiomyopathy  Implantable defibrillator-CRT-[employer]   The patient's device was interrogated.  The information was reviewed. No changes were made in the programming.     On warfarin  No intercurrent atrial fibrillation or  flutter  Acutely decompensated  With hx of " bronchitis" and pneumonia may need CXR but for now will augment diuresis with increasing lasix 40 qd >>80 bid-- no clear trigger so will treat fluid and ...   Will check blood work today   continue entresto 24/26 although we may be able to increase this dose if renal function is stable    Called Dr Inda Merlin office for updated inform  More than 50% of 45 min was spent in counseling related to the above

## 2016-09-13 LAB — BASIC METABOLIC PANEL
BUN/Creatinine Ratio: 17 (ref 10–24)
BUN: 23 mg/dL (ref 8–27)
CO2: 19 mmol/L (ref 18–29)
CREATININE: 1.34 mg/dL — AB (ref 0.76–1.27)
Calcium: 8.7 mg/dL (ref 8.6–10.2)
Chloride: 106 mmol/L (ref 96–106)
GFR, EST AFRICAN AMERICAN: 61 mL/min/{1.73_m2} (ref >59)
GFR, EST NON AFRICAN AMERICAN: 53 mL/min/{1.73_m2} — AB (ref >59)
Glucose: 219 mg/dL — ABNORMAL HIGH (ref 65–99)
POTASSIUM: 5.1 mmol/L (ref 3.5–5.2)
Sodium: 144 mmol/L (ref 134–144)

## 2016-09-13 LAB — CBC WITH DIFFERENTIAL/PLATELET
BASOS: 1 %
Basophils Absolute: 0 10*3/uL (ref 0.0–0.2)
EOS (ABSOLUTE): 0.1 10*3/uL (ref 0.0–0.4)
EOS: 3 %
HEMATOCRIT: 37.9 % (ref 37.5–51.0)
Hemoglobin: 11.8 g/dL — ABNORMAL LOW (ref 13.0–17.7)
IMMATURE GRANULOCYTES: 0 %
Immature Grans (Abs): 0 10*3/uL (ref 0.0–0.1)
LYMPHS: 17 %
Lymphocytes Absolute: 0.7 10*3/uL (ref 0.7–3.1)
MCH: 24.3 pg — AB (ref 26.6–33.0)
MCHC: 31.1 g/dL — ABNORMAL LOW (ref 31.5–35.7)
MCV: 78 fL — AB (ref 79–97)
Monocytes Absolute: 0.4 10*3/uL (ref 0.1–0.9)
Monocytes: 9 %
NEUTROS ABS: 3 10*3/uL (ref 1.4–7.0)
Neutrophils: 70 %
PLATELETS: 240 10*3/uL (ref 150–379)
RBC: 4.86 x10E6/uL (ref 4.14–5.80)
RDW: 18 % — ABNORMAL HIGH (ref 12.3–15.4)
WBC: 4.2 10*3/uL (ref 3.4–10.8)

## 2016-09-19 ENCOUNTER — Other Ambulatory Visit: Payer: Medicare Other | Admitting: *Deleted

## 2016-09-19 DIAGNOSIS — I428 Other cardiomyopathies: Secondary | ICD-10-CM

## 2016-09-19 DIAGNOSIS — I5022 Chronic systolic (congestive) heart failure: Secondary | ICD-10-CM

## 2016-09-20 LAB — BASIC METABOLIC PANEL
BUN / CREAT RATIO: 19 (ref 10–24)
BUN: 26 mg/dL (ref 8–27)
CHLORIDE: 99 mmol/L (ref 96–106)
CO2: 21 mmol/L (ref 18–29)
Calcium: 8.9 mg/dL (ref 8.6–10.2)
Creatinine, Ser: 1.34 mg/dL — ABNORMAL HIGH (ref 0.76–1.27)
GFR calc non Af Amer: 53 mL/min/{1.73_m2} — ABNORMAL LOW (ref >59)
GFR, EST AFRICAN AMERICAN: 61 mL/min/{1.73_m2} (ref >59)
Glucose: 212 mg/dL — ABNORMAL HIGH (ref 65–99)
POTASSIUM: 3.9 mmol/L (ref 3.5–5.2)
SODIUM: 141 mmol/L (ref 134–144)

## 2016-09-26 ENCOUNTER — Ambulatory Visit (INDEPENDENT_AMBULATORY_CARE_PROVIDER_SITE_OTHER): Payer: Medicare Other | Admitting: Physician Assistant

## 2016-09-26 VITALS — BP 110/66 | HR 91 | Ht 69.0 in | Wt 218.0 lb

## 2016-09-26 DIAGNOSIS — I472 Ventricular tachycardia, unspecified: Secondary | ICD-10-CM

## 2016-09-26 DIAGNOSIS — I255 Ischemic cardiomyopathy: Secondary | ICD-10-CM | POA: Diagnosis not present

## 2016-09-26 DIAGNOSIS — I5043 Acute on chronic combined systolic (congestive) and diastolic (congestive) heart failure: Secondary | ICD-10-CM

## 2016-09-26 DIAGNOSIS — I4891 Unspecified atrial fibrillation: Secondary | ICD-10-CM | POA: Diagnosis not present

## 2016-09-26 NOTE — Progress Notes (Signed)
Cardiology Office Note Date:  09/26/2016  Patient ID:  [name] [name], [name] [name] Oct 18, 1945, MRN [id number] PCP:  [name], MD  Electrophysiologist:  Dr. Caryl Comes  Chief Complaint: routine EP visit  History of Present Illness: [name] [name] is a 71 y.o. male with history of NICM w/CRT-D, chronic CHF/systolic, PAFib, NSVT, HLD, PVD with b/l BKA's, arthritis, HTN, DM, CRI stage III.  Discharged from [facility] s/p hip fracture (trip fall in his garden) and surgery 12/28/15.  He comes in today to be seen for Dr. Caryl Comes, last seen by him 09/12/16, at that visit he was found to have symptoms and evidence of fluid OL, (noted he was had been recently treated for a bronchitis/pneumonia), his lasix was increased from 40 daily to 80mg  BID x4days and planned for f/u today.  BUN.Creat 09/12/16  visit 23/1.34, K+ 5.1, H/H stable f/u labs 09/19/16 renal function remained stable at 26/1.34, K+ 3.9  He is accompanied by his wife, they report his SOB is improved but not to his baseline.  He reports good up-tick in urination for the 4 days of the extra lasix.  He denies any CP, continues to get winded though not as much as before he saw Dr. Caryl Comes.  He sleeps comfortably with one pillow, denies symptoms of PND or orthopnea, no near syncope or syncope, but has days were he feels generally tired/weak.  He reports his baseline as having pretty good exertional capacity and doesn't think he is quite back there yet, still feels a little bloated  He denies any bleeding or signs of bleeding, mentions that on/off there are a couple spots that his prothestic rubs and get sore/bleed a little at times, easily controlled and not persistantly   DEVICE information: BSCi CRT-D implanted 12/22/03, gen change 07/05/09, primary prevention AAD tx: no Appropriate therapy: the patient denies any known shocks   Past Medical History:  Diagnosis Date  . Anemia    takes Iron pill daily  . Arthritis   . Automatic implantable  cardioverter-defibrillator in situ    a. 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  . CAD (coronary artery disease)   . Cardiomyopathy, nonischemic ([place])    a. Cath 2003: mild nonobstructive CAD, EF 25% at that time.  . Chronic systolic CHF (congestive heart failure) (HCC)    takes Furosemide daily as well as Aldactone  . CKD (chronic kidney disease), stage III   . Complication of anesthesia   . Constipation    takes Sennokot daily  . Dementia   . GERD (gastroesophageal reflux disease)    takes Nexium daily  . Headache    occasionally  . High cholesterol    takes Atorvastatin daily  . History of blood transfusion    no abnormal reaction noted  . History of bronchitis    > 1 yr ago  . History of kidney stones   . Hypertension    takes Coreg daily  . LBBB (left bundle branch block)    takes Coumadin daily  . NSVT (nonsustained ventricular tachycardia) ([place])    a. Noted on ICD interrogation in 2011.  [name] PAD (peripheral artery disease) ([place])    a. 08/2013 Periph Angio/PTA: Abd Ao nl, RLE- 3v runoff, PT diff dzs, AT 90p, LLE 2v runoff, PT 100, AT 75m (diamondback ORA/chocolate balloon PTA).  [name] PAF (paroxysmal atrial fibrillation) ([place])    a. Noted on ICD interrogation 2012;  b. coumadin d/c'd 01/2013.  [name] PAF (paroxysmal atrial fibrillation) ([place])   .  Peripheral neuropathy (HCC)    hands;numbness and tingling   . Pneumonia    hx of > 27yr ago  . PONV (postoperative nausea and vomiting)   . Type II diabetes mellitus ([place])    takes Levemir daily as well as Novolog  . Urinary frequency   . Urinary urgency     Past Surgical History:  Procedure Laterality Date  . ABDOMINAL ANGIOGRAM  09/12/2013   Procedure: ABDOMINAL ANGIOGRAM;  Surgeon: Lorretta Harp, MD;  Location: [facility] CATH LAB;  Service: Cardiovascular;;  . AMPUTATION Left 10/04/2013   Procedure: LEFT GREAT TOE AND SECOND TOE AMPUTATION;  Surgeon: Marianna Payment, MD;  Location: [place];  Service:  Orthopedics;  Laterality: Left;  . AMPUTATION Left 11/09/2013   Procedure: LEFT AMPUTATION BELOW KNEE;  Surgeon: Marianna Payment, MD;  Location: [place];  Service: Orthopedics;  Laterality: Left;  . AMPUTATION Right 01/04/2014   Procedure: Doristine Devoid second and third toe amputation;  Surgeon: Marianna Payment, MD;  Location: [place];  Service: Orthopedics;  Laterality: Right;  . AMPUTATION Right 01/30/2014   Procedure: RIGHT BELOW KNEE AMPUTATION;  Surgeon: Marianna Payment, MD;  Location: [place];  Service: Orthopedics;  Laterality: Right;  . ATHERECTOMY Right 12/29/2013   Procedure: ATHERECTOMY;  Surgeon: Lorretta Harp, MD;  Location: [facility] CATH LAB;  Service: Cardiovascular;  Laterality: Right;  Anterior Tibial Artery  . CARDIAC CATHETERIZATION  10/01/2001   THERE WAS GLOBAL HYPOKINESIS AND EF 25%. THERE APPEARED TO BE GLOBAL DECREASE IN WALL MOTION  . CARDIAC DEFIBRILLATOR PLACEMENT  06/2009   WITH GENERATOR REPLACED; BiV ICD  . CARDIOVASCULAR STRESS TEST  03/20/2009   EF 33%  . [place]  . COLONOSCOPY    . ESOPHAGOGASTRODUODENOSCOPY    . HIP ARTHROPLASTY Left 12/28/2015   Procedure: POSTERIOR  APPROACH HEMI HIP ARTHROPLASTY;  Surgeon: Leandrew Koyanagi, MD;  Location: [place];  Service: Orthopedics;  Laterality: Left;  . LEG AMPUTATION BELOW KNEE Left 11/09/2013   DR Erlinda Hong  . LITHOTRIPSY  2001  . LOWER EXTREMITY ANGIOGRAM Bilateral 09/12/2013   Procedure: LOWER EXTREMITY ANGIOGRAM;  Surgeon: Lorretta Harp, MD;  Location: [facility] CATH LAB;  Service: Cardiovascular;  Laterality: Bilateral;  . LOWER EXTREMITY ANGIOGRAM N/A 12/29/2013   Procedure: LOWER EXTREMITY ANGIOGRAM;  Surgeon: Lorretta Harp, MD;  Location: [facility] CATH LAB;  Service: Cardiovascular;  Laterality: N/A;  . STUMP REVISION Left 01/03/2015   Procedure: LEFT BELOW KNEE AMPUTATION REVISION;  Surgeon: Leandrew Koyanagi, MD;  Location: [place];  Service: Orthopedics;  Laterality: Left;  . TOE AMPUTATION  10/04/2013   LEFT GREAT TOE AND 4TH  TOE   /   DR Erlinda Hong  . TRANSLUMINAL ATHERECTOMY TIBIAL ARTERY Left 09/12/2013  . US ECHOCARDIOGRAPHY  03/21/2008   EF 30-35%    Current Outpatient Prescriptions  Medication Sig Dispense Refill  . acetaminophen (TYLENOL) 325 MG tablet Take 2 tablets (650 mg total) by mouth every 6 (six) hours as needed for mild pain (or Fever >/= 101).    [name] aspirin EC 81 MG tablet Take 81 mg by mouth daily.    [name] atorvastatin (LIPITOR) 10 MG tablet Take 1 tablet (10 mg total) by mouth daily. 30 tablet 1  . carvedilol (COREG) 12.5 MG tablet Take 1 tablet (12.5 mg total) by mouth 2 (two) times daily with a meal. 60 tablet 1  . Cholecalciferol (VITAMIN D3) 10000 units TABS Take 1 tablet by mouth daily.    [name]  furosemide (LASIX) 40 MG tablet Take 1 tablet (40 mg total) by mouth daily. 30 tablet 1  . insulin detemir (LEVEMIR) 100 UNIT/ML injection 25 unit AM and 35 unit PM (Patient taking differently: Inject 25-35 Units into the skin See admin instructions. 25 unit AM and 35 unit PM) 10 mL 1  . iron polysaccharides (NIFEREX) 150 MG capsule Take 1 capsule (150 mg total) by mouth daily. 30 capsule 1  . Multiple Vitamin (MULTIVITAMIN WITH MINERALS) TABS tablet Take 1 tablet by mouth daily.    . Multiple Vitamin (MULTIVITAMIN) tablet Take 1 tablet by mouth daily.    [name] omeprazole (PRILOSEC) 20 MG capsule Take 20 mg by mouth daily.    . pregabalin (LYRICA) 75 MG capsule Take 1 capsule (75 mg total) by mouth 2 (two) times daily. 60 capsule 1  . sacubitril-valsartan (ENTRESTO) 24-26 MG Take 1 tablet by mouth 2 (two) times daily. 60 tablet 6  . senna-docusate (SENOKOT-S) 8.6-50 MG per tablet Take 1 tablet by mouth 2 (two) times daily. 60 tablet 1  . warfarin (COUMADIN) 10 MG tablet Take 10 mg by mouth daily. Take 10 mg Mon, Wed , Friday .  Take 15mg  on Sunday, tues, Thurs and Sat    . warfarin (COUMADIN) 10 MG tablet TAKE AS DIRECTED BY COUMADIN CLINIC 100 tablet 0   No current facility-administered medications for this visit.      Allergies:   Patient has no known allergies.   Social History:  The patient  reports that he has never smoked. He has never used smokeless tobacco. He reports that he does not drink alcohol or use drugs.   Family History:  The patient's family history includes Diabetes in his father and mother; Heart disease in his mother; Hypertension in his mother.  ROS:  Please see the history of present illness. All other systems are reviewed and otherwise negative.   PHYSICAL EXAM:  VS:  There were no vitals taken for this visit. BMI: There is no height or weight on file to calculate BMI. Well nourished, well developed, in no acute distress  HEENT: normocephalic, atraumatic  Neck: no JVD, carotid bruits or masses Cardiac:  RRR; no significant murmurs, no rubs, or gallops Lungs:  CTA b/l, no wheezing, rhonchi or rales  Abd: soft, nontender,  MS: no deformity or atrophy Ext: b/l BKA's, prosthetics in place Skin: warm and dry, no rash Neuro:  No gross deficits appreciated Psych: euthymic mood, full affect  ICD site is stable, no tethering or discomfort   EKG:  Done 12/27/15 shows SR, IVCD ICD check today, reviewed by myself: battery is good, head very brief AFlutter and one NSVT noting the rate oscillated above/below VT zone,  no therapies, lead status is stable, he is in SR today, 99% BiVe paced 02/15/13: Echocardiogram Study Conclusions - Left ventricle: The cavity size was mildly dilated. Wall thickness was normal. Systolic function was severely reduced. The estimated ejection fraction was in the range of 20% to 25%. Diffuse hypokinesis. Doppler parameters are consistent with abnormal left ventricular relaxation (grade 1 diastolic dysfunction). - Mitral valve: Mild regurgitation. - Left atrium: The atrium was mildly dilated.   Recent Labs: 12/28/2015: B Natriuretic Peptide 380.3 04/30/2016: ALT 15; TSH 2.516 05/01/2016: Hemoglobin 10.2 09/12/2016: Platelets 240 09/19/2016: BUN  26; Creatinine, Ser 1.34; Potassium 3.9; Sodium 141  No results found for requested labs within last 8760 hours.   Estimated Creatinine Clearance: 58.2 mL/min (by C-G formula based on SCr of 1.34 mg/dL (H)).  Wt Readings from Last 3 Encounters:  09/12/16 221 lb 6.4 oz (100.4 kg)  05/01/16 208 lb 1.6 oz (94.4 kg)  02/11/16 209 lb (94.8 kg)     Other studies reviewed: Additional studies/records reviewed today include: summarized above    ASSESSMENT AND PLAN:  1.  NICM, chronic systolic CHF     Exam is euvolemic, though patient feels he is not quite to his baseline      On BB/ARB, diuretic tx      ICD with intact function, reviewed with Dr. Curt Bears (in-clinic) given his NSVT episode noted rate that fell below detection, only a couple beats fast enough to declare the event, in effort to not miss VT the device was reprogrammed to include  VT1 detection 160 w/ATP therapies only, VT2 to 180 and VF to 220 no changes to therapies were made to these zones.       HR Histograms note no HR >130      [place] remotes  2. PAFib     CHA2DS2Vasc is 5 on warfarin, monitored and managed by coumadin clinic   3. CRI stage III     4. Anemia     C/w PMD  5. PVD s/p b/l BKAs     He has been maintained on Plavix, no ASA       Disposition: Will increase his lasix to 40mg  BID for 4 days then back to QD, BMET in 2 weeks, if renal function stable will plan to up-titrate his Entresto and see himi back in 4-6 weeks, sooner if needed.  He is urged to discuss the rubbibng /skin irritation with his prosthetics MD.  Current medicines are reviewed at length with the patient today.  The patient did not have any concerns regarding medicines.  Haywood Lasso, PA-C 09/26/2016 6:55 AM     [facility] [address] [place] [address] [phone] (office)  [phone] (fax)

## 2016-09-26 NOTE — Patient Instructions (Signed)
Medication Instructions:   START LASIX 40 MG TWICE A DAY FOR FOUR DAYS THEN GO BACK TO BACK TO 40 MG ONCE A DAY   If you need a refill on your cardiac medications before your next appointment, please call your pharmacy.  Labwork: RETURN FOR BMET IN 2 WEEKS    Testing/Procedures: NONE ORDERED  TODAY    Follow-Up: IN  4 TO 6 WEEKS    Any Other Special Instructions Will Be Listed Below (If Applicable).

## 2016-10-07 ENCOUNTER — Other Ambulatory Visit: Payer: Medicare Other | Admitting: *Deleted

## 2016-10-07 ENCOUNTER — Ambulatory Visit (INDEPENDENT_AMBULATORY_CARE_PROVIDER_SITE_OTHER): Payer: Medicare Other | Admitting: *Deleted

## 2016-10-07 DIAGNOSIS — I4891 Unspecified atrial fibrillation: Secondary | ICD-10-CM | POA: Diagnosis not present

## 2016-10-07 DIAGNOSIS — Z5181 Encounter for therapeutic drug level monitoring: Secondary | ICD-10-CM

## 2016-10-07 LAB — POCT INR: INR: 2

## 2016-10-07 LAB — BASIC METABOLIC PANEL
BUN/Creatinine Ratio: 19 (ref 10–24)
BUN: 32 mg/dL — AB (ref 8–27)
CALCIUM: 8.5 mg/dL — AB (ref 8.6–10.2)
CHLORIDE: 105 mmol/L (ref 96–106)
CO2: 19 mmol/L (ref 18–29)
Creatinine, Ser: 1.68 mg/dL — ABNORMAL HIGH (ref 0.76–1.27)
GFR calc Af Amer: 46 mL/min/{1.73_m2} — ABNORMAL LOW (ref >59)
GFR calc non Af Amer: 40 mL/min/{1.73_m2} — ABNORMAL LOW (ref >59)
GLUCOSE: 105 mg/dL — AB (ref 65–99)
Potassium: 4.6 mmol/L (ref 3.5–5.2)
Sodium: 144 mmol/L (ref 134–144)

## 2016-10-13 ENCOUNTER — Emergency Department (HOSPITAL_COMMUNITY): Payer: Medicare Other

## 2016-10-13 ENCOUNTER — Inpatient Hospital Stay (HOSPITAL_COMMUNITY)
Admission: EM | Admit: 2016-10-13 | Discharge: 2016-10-23 | DRG: 291 | Disposition: A | Discharge status: Home / Self Care | Payer: Medicare Other | Attending: Family Medicine | Admitting: Family Medicine

## 2016-10-13 ENCOUNTER — Encounter (HOSPITAL_COMMUNITY): Payer: Self-pay | Admitting: Nurse Practitioner

## 2016-10-13 DIAGNOSIS — K810 Acute cholecystitis: Secondary | ICD-10-CM | POA: Diagnosis not present

## 2016-10-13 DIAGNOSIS — D638 Anemia in other chronic diseases classified elsewhere: Secondary | ICD-10-CM | POA: Diagnosis present

## 2016-10-13 DIAGNOSIS — I2510 Atherosclerotic heart disease of native coronary artery without angina pectoris: Secondary | ICD-10-CM | POA: Diagnosis present

## 2016-10-13 DIAGNOSIS — K819 Cholecystitis, unspecified: Secondary | ICD-10-CM | POA: Diagnosis not present

## 2016-10-13 DIAGNOSIS — R339 Retention of urine, unspecified: Secondary | ICD-10-CM | POA: Diagnosis present

## 2016-10-13 DIAGNOSIS — Z7901 Long term (current) use of anticoagulants: Secondary | ICD-10-CM

## 2016-10-13 DIAGNOSIS — R109 Unspecified abdominal pain: Secondary | ICD-10-CM | POA: Diagnosis not present

## 2016-10-13 DIAGNOSIS — M7989 Other specified soft tissue disorders: Secondary | ICD-10-CM | POA: Diagnosis not present

## 2016-10-13 DIAGNOSIS — I472 Ventricular tachycardia: Secondary | ICD-10-CM | POA: Diagnosis present

## 2016-10-13 DIAGNOSIS — I5023 Acute on chronic systolic (congestive) heart failure: Secondary | ICD-10-CM | POA: Diagnosis present

## 2016-10-13 DIAGNOSIS — K8000 Calculus of gallbladder with acute cholecystitis without obstruction: Secondary | ICD-10-CM | POA: Diagnosis present

## 2016-10-13 DIAGNOSIS — R0600 Dyspnea, unspecified: Secondary | ICD-10-CM | POA: Diagnosis not present

## 2016-10-13 DIAGNOSIS — I130 Hypertensive heart and chronic kidney disease with heart failure and stage 1 through stage 4 chronic kidney disease, or unspecified chronic kidney disease: Principal | ICD-10-CM | POA: Diagnosis present

## 2016-10-13 DIAGNOSIS — Z87442 Personal history of urinary calculi: Secondary | ICD-10-CM | POA: Diagnosis not present

## 2016-10-13 DIAGNOSIS — K429 Umbilical hernia without obstruction or gangrene: Secondary | ICD-10-CM | POA: Diagnosis present

## 2016-10-13 DIAGNOSIS — I447 Left bundle-branch block, unspecified: Secondary | ICD-10-CM | POA: Diagnosis present

## 2016-10-13 DIAGNOSIS — D696 Thrombocytopenia, unspecified: Secondary | ICD-10-CM | POA: Diagnosis present

## 2016-10-13 DIAGNOSIS — I5043 Acute on chronic combined systolic (congestive) and diastolic (congestive) heart failure: Secondary | ICD-10-CM | POA: Diagnosis present

## 2016-10-13 DIAGNOSIS — E669 Obesity, unspecified: Secondary | ICD-10-CM | POA: Diagnosis present

## 2016-10-13 DIAGNOSIS — Z79899 Other long term (current) drug therapy: Secondary | ICD-10-CM

## 2016-10-13 DIAGNOSIS — Z95810 Presence of automatic (implantable) cardiac defibrillator: Secondary | ICD-10-CM

## 2016-10-13 DIAGNOSIS — I480 Paroxysmal atrial fibrillation: Secondary | ICD-10-CM | POA: Diagnosis present

## 2016-10-13 DIAGNOSIS — E1121 Type 2 diabetes mellitus with diabetic nephropathy: Secondary | ICD-10-CM | POA: Diagnosis present

## 2016-10-13 DIAGNOSIS — Z7982 Long term (current) use of aspirin: Secondary | ICD-10-CM

## 2016-10-13 DIAGNOSIS — N3289 Other specified disorders of bladder: Secondary | ICD-10-CM | POA: Diagnosis present

## 2016-10-13 DIAGNOSIS — N179 Acute kidney failure, unspecified: Secondary | ICD-10-CM | POA: Diagnosis not present

## 2016-10-13 DIAGNOSIS — E1122 Type 2 diabetes mellitus with diabetic chronic kidney disease: Secondary | ICD-10-CM | POA: Diagnosis present

## 2016-10-13 DIAGNOSIS — R197 Diarrhea, unspecified: Secondary | ICD-10-CM

## 2016-10-13 DIAGNOSIS — N183 Chronic kidney disease, stage 3 unspecified: Secondary | ICD-10-CM

## 2016-10-13 DIAGNOSIS — E785 Hyperlipidemia, unspecified: Secondary | ICD-10-CM | POA: Diagnosis present

## 2016-10-13 DIAGNOSIS — I10 Essential (primary) hypertension: Secondary | ICD-10-CM | POA: Diagnosis not present

## 2016-10-13 DIAGNOSIS — E870 Hyperosmolality and hypernatremia: Secondary | ICD-10-CM | POA: Diagnosis not present

## 2016-10-13 DIAGNOSIS — E1151 Type 2 diabetes mellitus with diabetic peripheral angiopathy without gangrene: Secondary | ICD-10-CM | POA: Diagnosis present

## 2016-10-13 DIAGNOSIS — D62 Acute posthemorrhagic anemia: Secondary | ICD-10-CM | POA: Diagnosis not present

## 2016-10-13 DIAGNOSIS — A419 Sepsis, unspecified organism: Secondary | ICD-10-CM | POA: Diagnosis not present

## 2016-10-13 DIAGNOSIS — E119 Type 2 diabetes mellitus without complications: Secondary | ICD-10-CM

## 2016-10-13 DIAGNOSIS — K219 Gastro-esophageal reflux disease without esophagitis: Secondary | ICD-10-CM | POA: Diagnosis present

## 2016-10-13 DIAGNOSIS — Z89511 Acquired absence of right leg below knee: Secondary | ICD-10-CM | POA: Diagnosis not present

## 2016-10-13 DIAGNOSIS — Z89512 Acquired absence of left leg below knee: Secondary | ICD-10-CM | POA: Diagnosis not present

## 2016-10-13 DIAGNOSIS — B952 Enterococcus as the cause of diseases classified elsewhere: Secondary | ICD-10-CM | POA: Diagnosis present

## 2016-10-13 DIAGNOSIS — I428 Other cardiomyopathies: Secondary | ICD-10-CM

## 2016-10-13 DIAGNOSIS — E869 Volume depletion, unspecified: Secondary | ICD-10-CM | POA: Diagnosis present

## 2016-10-13 DIAGNOSIS — E118 Type 2 diabetes mellitus with unspecified complications: Secondary | ICD-10-CM

## 2016-10-13 DIAGNOSIS — K567 Ileus, unspecified: Secondary | ICD-10-CM | POA: Diagnosis not present

## 2016-10-13 DIAGNOSIS — N170 Acute kidney failure with tubular necrosis: Secondary | ICD-10-CM | POA: Diagnosis not present

## 2016-10-13 DIAGNOSIS — R0603 Acute respiratory distress: Secondary | ICD-10-CM

## 2016-10-13 DIAGNOSIS — Z6830 Body mass index (BMI) 30.0-30.9, adult: Secondary | ICD-10-CM

## 2016-10-13 DIAGNOSIS — I4891 Unspecified atrial fibrillation: Secondary | ICD-10-CM | POA: Diagnosis present

## 2016-10-13 DIAGNOSIS — R112 Nausea with vomiting, unspecified: Secondary | ICD-10-CM | POA: Diagnosis present

## 2016-10-13 DIAGNOSIS — Z794 Long term (current) use of insulin: Secondary | ICD-10-CM

## 2016-10-13 LAB — CBC WITH DIFFERENTIAL/PLATELET
BASOS PCT: 0 %
Basophils Absolute: 0 10*3/uL (ref 0.0–0.1)
EOS ABS: 0.1 10*3/uL (ref 0.0–0.7)
Eosinophils Relative: 2 %
HEMATOCRIT: 35 % — AB (ref 39.0–52.0)
HEMOGLOBIN: 11.3 g/dL — AB (ref 13.0–17.0)
LYMPHS ABS: 0.7 10*3/uL (ref 0.7–4.0)
Lymphocytes Relative: 15 %
MCH: 24.2 pg — ABNORMAL LOW (ref 26.0–34.0)
MCHC: 32.3 g/dL (ref 30.0–36.0)
MCV: 74.9 fL — ABNORMAL LOW (ref 78.0–100.0)
MONO ABS: 0.3 10*3/uL (ref 0.1–1.0)
MONOS PCT: 7 %
Neutro Abs: 3.4 10*3/uL (ref 1.7–7.7)
Neutrophils Relative %: 76 %
Platelets: 155 10*3/uL (ref 150–400)
RBC: 4.67 MIL/uL (ref 4.22–5.81)
RDW: 16.6 % — AB (ref 11.5–15.5)
WBC: 4.4 10*3/uL (ref 4.0–10.5)

## 2016-10-13 LAB — BRAIN NATRIURETIC PEPTIDE: B NATRIURETIC PEPTIDE 5: 1039 pg/mL — AB (ref 0.0–100.0)

## 2016-10-13 LAB — URINALYSIS, ROUTINE W REFLEX MICROSCOPIC
BACTERIA UA: NONE SEEN
BILIRUBIN URINE: NEGATIVE
Glucose, UA: 50 mg/dL — AB
Ketones, ur: NEGATIVE mg/dL
Leukocytes, UA: NEGATIVE
NITRITE: NEGATIVE
PH: 5 (ref 5.0–8.0)
Protein, ur: 30 mg/dL — AB
SPECIFIC GRAVITY, URINE: 1.01 (ref 1.005–1.030)
Squamous Epithelial / LPF: NONE SEEN

## 2016-10-13 LAB — PROTIME-INR
INR: 3.48
PROTHROMBIN TIME: 35.8 s — AB (ref 11.4–15.2)

## 2016-10-13 LAB — COMPREHENSIVE METABOLIC PANEL
ALBUMIN: 3.4 g/dL — AB (ref 3.5–5.0)
ALK PHOS: 71 U/L (ref 38–126)
ALT: 53 U/L (ref 17–63)
AST: 26 U/L (ref 15–41)
Anion gap: 10 (ref 5–15)
BUN: 29 mg/dL — ABNORMAL HIGH (ref 6–20)
CALCIUM: 8.6 mg/dL — AB (ref 8.9–10.3)
CHLORIDE: 113 mmol/L — AB (ref 101–111)
CO2: 21 mmol/L — AB (ref 22–32)
CREATININE: 1.6 mg/dL — AB (ref 0.61–1.24)
GFR calc Af Amer: 48 mL/min — ABNORMAL LOW (ref >60)
GFR calc non Af Amer: 41 mL/min — ABNORMAL LOW (ref >60)
GLUCOSE: 135 mg/dL — AB (ref 65–99)
Potassium: 4 mmol/L (ref 3.5–5.1)
SODIUM: 144 mmol/L (ref 135–145)
Total Bilirubin: 0.4 mg/dL (ref 0.3–1.2)
Total Protein: 6.6 g/dL (ref 6.5–8.1)

## 2016-10-13 LAB — I-STAT CHEM 8, ED
BUN: 32 mg/dL — AB (ref 6–20)
CALCIUM ION: 1.11 mmol/L — AB (ref 1.15–1.40)
CHLORIDE: 111 mmol/L (ref 101–111)
Creatinine, Ser: 1.6 mg/dL — ABNORMAL HIGH (ref 0.61–1.24)
Glucose, Bld: 134 mg/dL — ABNORMAL HIGH (ref 65–99)
HEMATOCRIT: 37 % — AB (ref 39.0–52.0)
Hemoglobin: 12.6 g/dL — ABNORMAL LOW (ref 13.0–17.0)
Potassium: 4.1 mmol/L (ref 3.5–5.1)
SODIUM: 145 mmol/L (ref 135–145)
TCO2: 22 mmol/L (ref 0–100)

## 2016-10-13 LAB — I-STAT CG4 LACTIC ACID, ED: LACTIC ACID, VENOUS: 1.31 mmol/L (ref 0.5–1.9)

## 2016-10-13 LAB — MAGNESIUM: Magnesium: 1.9 mg/dL (ref 1.7–2.4)

## 2016-10-13 LAB — I-STAT TROPONIN, ED: TROPONIN I, POC: 0.01 ng/mL (ref 0.00–0.08)

## 2016-10-13 LAB — CBG MONITORING, ED: Glucose-Capillary: 104 mg/dL — ABNORMAL HIGH (ref 65–99)

## 2016-10-13 LAB — GLUCOSE, CAPILLARY: GLUCOSE-CAPILLARY: 244 mg/dL — AB (ref 65–99)

## 2016-10-13 MED ORDER — FUROSEMIDE 10 MG/ML IJ SOLN
80.0000 mg | Freq: Two times a day (BID) | INTRAMUSCULAR | Status: DC
  Administered 2016-10-13 – 2016-10-14 (×3): 80 mg via INTRAVENOUS
  Filled 2016-10-13 (×3): qty 8

## 2016-10-13 MED ORDER — POLYSACCHARIDE IRON COMPLEX 150 MG PO CAPS
150.0000 mg | ORAL_CAPSULE | Freq: Every day | ORAL | Status: DC
  Administered 2016-10-13 – 2016-10-14 (×2): 150 mg via ORAL
  Filled 2016-10-13 (×2): qty 1

## 2016-10-13 MED ORDER — INSULIN ASPART 100 UNIT/ML ~~LOC~~ SOLN
0.0000 [IU] | Freq: Three times a day (TID) | SUBCUTANEOUS | Status: DC
  Administered 2016-10-14: 3 [IU] via SUBCUTANEOUS
  Administered 2016-10-14: 2 [IU] via SUBCUTANEOUS
  Administered 2016-10-14 – 2016-10-15 (×3): 3 [IU] via SUBCUTANEOUS
  Administered 2016-10-16: 5 [IU] via SUBCUTANEOUS
  Administered 2016-10-16 (×2): 3 [IU] via SUBCUTANEOUS
  Administered 2016-10-17 – 2016-10-19 (×3): 1 [IU] via SUBCUTANEOUS
  Administered 2016-10-19 – 2016-10-20 (×4): 5 [IU] via SUBCUTANEOUS
  Administered 2016-10-21: 3 [IU] via SUBCUTANEOUS
  Administered 2016-10-21: 5 [IU] via SUBCUTANEOUS
  Administered 2016-10-21 – 2016-10-22 (×2): 3 [IU] via SUBCUTANEOUS
  Administered 2016-10-22: 5 [IU] via SUBCUTANEOUS
  Administered 2016-10-22: 3 [IU] via SUBCUTANEOUS
  Administered 2016-10-23 (×2): 7 [IU] via SUBCUTANEOUS
  Administered 2016-10-23: 3 [IU] via SUBCUTANEOUS

## 2016-10-13 MED ORDER — INSULIN DETEMIR 100 UNIT/ML ~~LOC~~ SOLN
25.0000 [IU] | Freq: Two times a day (BID) | SUBCUTANEOUS | Status: DC
  Administered 2016-10-13 – 2016-10-15 (×4): 25 [IU] via SUBCUTANEOUS
  Filled 2016-10-13 (×5): qty 0.25

## 2016-10-13 MED ORDER — ACETAMINOPHEN 325 MG PO TABS
650.0000 mg | ORAL_TABLET | Freq: Four times a day (QID) | ORAL | Status: DC | PRN
  Filled 2016-10-13: qty 2

## 2016-10-13 MED ORDER — PREGABALIN 25 MG PO CAPS
75.0000 mg | ORAL_CAPSULE | Freq: Two times a day (BID) | ORAL | Status: DC
  Administered 2016-10-13 – 2016-10-16 (×5): 75 mg via ORAL
  Filled 2016-10-13 (×5): qty 3

## 2016-10-13 MED ORDER — ASPIRIN EC 81 MG PO TBEC
81.0000 mg | DELAYED_RELEASE_TABLET | Freq: Every day | ORAL | Status: DC
  Administered 2016-10-13 – 2016-10-16 (×4): 81 mg via ORAL
  Filled 2016-10-13 (×4): qty 1

## 2016-10-13 MED ORDER — SENNOSIDES-DOCUSATE SODIUM 8.6-50 MG PO TABS
1.0000 | ORAL_TABLET | Freq: Two times a day (BID) | ORAL | Status: DC
  Administered 2016-10-14 – 2016-10-20 (×11): 1 via ORAL
  Filled 2016-10-13 (×15): qty 1

## 2016-10-13 MED ORDER — CARVEDILOL 12.5 MG PO TABS
12.5000 mg | ORAL_TABLET | Freq: Two times a day (BID) | ORAL | Status: DC
  Administered 2016-10-13 – 2016-10-16 (×6): 12.5 mg via ORAL
  Filled 2016-10-13 (×6): qty 1

## 2016-10-13 MED ORDER — FUROSEMIDE 10 MG/ML IJ SOLN
80.0000 mg | Freq: Once | INTRAMUSCULAR | Status: AC
  Administered 2016-10-13: 80 mg via INTRAVENOUS
  Filled 2016-10-13: qty 8

## 2016-10-13 MED ORDER — ATORVASTATIN CALCIUM 10 MG PO TABS
10.0000 mg | ORAL_TABLET | Freq: Every day | ORAL | Status: DC
  Administered 2016-10-14 – 2016-10-23 (×9): 10 mg via ORAL
  Filled 2016-10-13 (×10): qty 1

## 2016-10-13 NOTE — Progress Notes (Signed)
As per order, NT performed bladder scan post void, with results of 541mL.  Dr. Marily Memos notified and placed foley insertion orders for bladder obstruction.

## 2016-10-13 NOTE — Progress Notes (Signed)
Unsuccessful attempt x1 to insert Foley catheter; met resistance.  Will report same to PM nurse.

## 2016-10-13 NOTE — ED Provider Notes (Signed)
[facility] DEPT Provider Note   CSN: [id number] Arrival date & time: 10/13/16  1147     History   Chief Complaint Chief Complaint  Patient presents with  . Chest Pain  . Abdominal Pain    HPI [name] [name] is a 71 y.o. male.  HPI [name] [name] is a 71 yo male with NICM status post ICD, HFrEF with EF to 20-25% in 2014, DM-2, CKD, PAF, bilateral BKA who presents with chest pain and shortness of breath that has gotten worse since 4 am this morning.  Patient reports shortness of breath for 2 weeks. He also reports chest pain that he describes as throbbing over the left chest. He stated that his pain started since an adjustment was made to his ICD by his cardiologist. However, per cardiologist's notes there was no changes to his ICD but they increased his Lasix from 40 to 80 mg twice a day.  He also reports worsening orthopnea from 1 to 2 pillows over the last 2 weeks. However, his chest pain and shortness of breath has gotten worse since 4 AM this morning. Reports nausea but denies emesis. He also reports diarrhea that he describes as watery and dark stool without fresh blood. He says he was treated for pneumonia about 2 months ago. Wife also reports reduced urine output. He denies abdominal pain but thinks his abdominal and swollen.   He reports good compliance with his medication. Last dose was last night. He denies cigarettes, drinking alcohol or recreational drug use.  Wt Readings from Last 10 Encounters:  09/26/16 98.9 kg  09/12/16 100.4 kg  05/01/16 94.4 kg  02/11/16 94.8 kg  01/02/16 91.1 kg  10/31/15 92.9 kg  01/10/15 84.2 kg  01/03/15 90.7 kg  12/21/14 90.9 kg  10/24/14 93 kg   Past Medical History:  Diagnosis Date  . Anemia    takes Iron pill daily  . Arthritis   . Automatic implantable cardioverter-defibrillator in situ    a. 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  . CAD (coronary artery disease)   . Cardiomyopathy,  nonischemic ([place])    a. Cath 2003: mild nonobstructive CAD, EF 25% at that time.  . Chronic systolic CHF (congestive heart failure) (HCC)    takes Furosemide daily as well as Aldactone  . CKD (chronic kidney disease), stage III   . Complication of anesthesia   . Constipation    takes Sennokot daily  . Dementia   . GERD (gastroesophageal reflux disease)    takes Nexium daily  . Headache    occasionally  . High cholesterol    takes Atorvastatin daily  . History of blood transfusion    no abnormal reaction noted  . History of bronchitis    > 1 yr ago  . History of kidney stones   . Hypertension    takes Coreg daily  . LBBB (left bundle branch block)    takes Coumadin daily  . NSVT (nonsustained ventricular tachycardia) ([place])    a. Noted on ICD interrogation in 2011.  [name] PAD (peripheral artery disease) ([place])    a. 08/2013 Periph Angio/PTA: Abd Ao nl, RLE- 3v runoff, PT diff dzs, AT 90p, LLE 2v runoff, PT 100, AT 5m (diamondback ORA/chocolate balloon PTA).  [name] PAF (paroxysmal atrial fibrillation) ([place])    a. Noted on ICD interrogation 2012;  b. coumadin d/c'd 01/2013.  [name] PAF (paroxysmal atrial fibrillation) ([place])   . Peripheral neuropathy (HCC)    hands;numbness and tingling   .  Pneumonia    hx of > 70yr ago  . PONV (postoperative nausea and vomiting)   . Type II diabetes mellitus ([place])    takes Levemir daily as well as Novolog  . Urinary frequency   . Urinary urgency     Patient Active Problem List   Diagnosis Date Noted  . HCAP (healthcare-associated pneumonia) 04/30/2016  . Weakness generalized 04/30/2016  . Status post hip hemiarthroplasty   . Urinary retention   . Dementia   . AKI (acute kidney injury) ([place])   . Thrombocytopenia ([place])   . Anemia of chronic disease   . Closed left hip fracture ([place]) 12/28/2015  . CKD (chronic kidney disease), stage III 12/28/2015  . Fall   . Diabetes mellitus with neuropathy ([place]) 01/10/2015  . Amputation of right lower extremity  below knee ([place]) 01/08/2015  . Below knee amputation status ([place]) 01/03/2015  . Atrial fibrillation [I48.91] 11/01/2014  . Encounter for therapeutic drug monitoring 11/01/2014  . S/P bilateral BKA (below knee amputation) ([place]) 02/02/2014  . Osteomyelitis of right foot ([place]) 01/30/2014  . 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 ([place]) 01/04/2014  . Osteomyelitis of toe of right foot ([place]) 01/04/2014  . S/P Lt BKA 11/09/13 11/14/2013  . Acute blood loss anemia 11/11/2013  . Type II or unspecified type diabetes mellitus 11/09/2013  . Lower limb amputation, great toe ([place]) 10/11/2013  . Lower limb amputation, other toe(s) 10/11/2013  . Chronic osteomyelitis of toe of left foot ([place]) 10/04/2013  . Foot osteomyelitis, left ([place]) 10/04/2013  . Uncontrolled pain, Lt toe 09/21/2013  . Gangrenous toe, Lt toe 09/21/2013  . PAD (peripheral artery disease) ([place]) 09/13/2013  . Diabetes mellitus ([place]) 09/13/2013  . Hyperlipidemia 09/13/2013  . PAF (paroxysmal atrial fibrillation) ([place]) 09/13/2013  . Critical lower limb ischemia- s/p Rt anterior tibial PTA 12/29/13 in preparation for Rt BKA 08/30/2013  . Generalized weakness 02/12/2013  . 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 HTN (hypertension) 04/08/2011  . NICM- EF 20-25% echo 6/14 09/29/2008  . LBBB 09/29/2008  . SYSTOLIC HEART FAILURE, CHRONIC 09/29/2008    Past Surgical History:  Procedure Laterality Date  . ABDOMINAL ANGIOGRAM  09/12/2013   Procedure: ABDOMINAL ANGIOGRAM;  Surgeon: Lorretta Harp, MD;  Location: [facility] CATH LAB;  Service: Cardiovascular;;  . AMPUTATION Left 10/04/2013   Procedure: LEFT GREAT TOE AND SECOND TOE AMPUTATION;  Surgeon: Marianna Payment, MD;  Location: [facility];  Service: Orthopedics;  Laterality: Left;  . AMPUTATION Left 11/09/2013   Procedure: LEFT AMPUTATION BELOW KNEE;  Surgeon: Marianna Payment, MD;  Location: [facility];  Service: Orthopedics;  Laterality: Left;  . AMPUTATION Right 01/04/2014    Procedure: Doristine Devoid second and third toe amputation;  Surgeon: Marianna Payment, MD;  Location: [facility];  Service: Orthopedics;  Laterality: Right;  . AMPUTATION Right 01/30/2014   Procedure: RIGHT BELOW KNEE AMPUTATION;  Surgeon: Marianna Payment, MD;  Location: [facility];  Service: Orthopedics;  Laterality: Right;  . ATHERECTOMY Right 12/29/2013   Procedure: ATHERECTOMY;  Surgeon: Lorretta Harp, MD;  Location: [facility] CATH LAB;  Service: Cardiovascular;  Laterality: Right;  Anterior Tibial Artery  . CARDIAC CATHETERIZATION  10/01/2001   THERE WAS GLOBAL HYPOKINESIS AND EF 25%. THERE APPEARED TO BE GLOBAL DECREASE IN WALL MOTION  . CARDIAC DEFIBRILLATOR PLACEMENT  06/2009   WITH GENERATOR REPLACED; BiV ICD  . CARDIOVASCULAR STRESS TEST  03/20/2009   EF 33%  . CERVICAL SPINE  SURGERY  1994  . COLONOSCOPY    . ESOPHAGOGASTRODUODENOSCOPY    . HIP ARTHROPLASTY Left 12/28/2015   Procedure: POSTERIOR  APPROACH HEMI HIP ARTHROPLASTY;  Surgeon: Leandrew Koyanagi, MD;  Location: [facility];  Service: Orthopedics;  Laterality: Left;  . LEG AMPUTATION BELOW KNEE Left 11/09/2013   DR Erlinda Hong  . LITHOTRIPSY  2001  . LOWER EXTREMITY ANGIOGRAM Bilateral 09/12/2013   Procedure: LOWER EXTREMITY ANGIOGRAM;  Surgeon: Lorretta Harp, MD;  Location: [facility] CATH LAB;  Service: Cardiovascular;  Laterality: Bilateral;  . LOWER EXTREMITY ANGIOGRAM N/A 12/29/2013   Procedure: LOWER EXTREMITY ANGIOGRAM;  Surgeon: Lorretta Harp, MD;  Location: [facility] CATH LAB;  Service: Cardiovascular;  Laterality: N/A;  . STUMP REVISION Left 01/03/2015   Procedure: LEFT BELOW KNEE AMPUTATION REVISION;  Surgeon: Leandrew Koyanagi, MD;  Location: [facility];  Service: Orthopedics;  Laterality: Left;  . TOE AMPUTATION  10/04/2013   LEFT GREAT TOE AND 4TH TOE   /   DR Erlinda Hong  . TRANSLUMINAL ATHERECTOMY TIBIAL ARTERY Left 09/12/2013  . US ECHOCARDIOGRAPHY  03/21/2008   EF 30-35%       Home Medications    Prior to Admission medications   Medication Sig Start Date End Date Taking?  Authorizing Provider  acetaminophen (TYLENOL) 325 MG tablet Take 2 tablets (650 mg total) by mouth every 6 (six) hours as needed for mild pain (or Fever >/= 101). 05/03/16   Maryann Mikhail, DO  aspirin EC 81 MG tablet Take 81 mg by mouth daily.    Historical Provider, MD  atorvastatin (LIPITOR) 10 MG tablet Take 1 tablet (10 mg total) by mouth daily. 01/15/15   Lavon Paganini Angiulli, PA-C  carvedilol (COREG) 12.5 MG tablet Take 1 tablet (12.5 mg total) by mouth 2 (two) times daily with a meal. 01/15/15   Lavon Paganini Angiulli, PA-C  Cholecalciferol (VITAMIN D3) 10000 units TABS Take 1 tablet by mouth daily.    Historical Provider, MD  furosemide (LASIX) 40 MG tablet Take 1 tablet (40 mg total) by mouth daily. 01/02/16   Theodis Blaze, MD  insulin detemir (LEVEMIR) 100 UNIT/ML injection 25 unit AM and 35 unit PM Patient taking differently: Inject 25-35 Units into the skin See admin instructions. 25 unit AM and 35 unit PM 01/02/16   Theodis Blaze, MD  iron polysaccharides (NIFEREX) 150 MG capsule Take 1 capsule (150 mg total) by mouth daily. 01/15/15   Lavon Paganini Angiulli, PA-C  Multiple Vitamin (MULTIVITAMIN WITH MINERALS) TABS tablet Take 1 tablet by mouth daily.    Historical Provider, MD  Multiple Vitamin (MULTIVITAMIN) tablet Take 1 tablet by mouth daily.    Historical Provider, MD  omeprazole (PRILOSEC) 20 MG capsule Take 20 mg by mouth daily.    Historical Provider, MD  pregabalin (LYRICA) 75 MG capsule Take 1 capsule (75 mg total) by mouth 2 (two) times daily. 01/15/15   Daniel J Angiulli, PA-C  sacubitril-valsartan (ENTRESTO) 24-26 MG Take 1 tablet by mouth 2 (two) times daily. 10/31/15   Deboraha Sprang, MD  senna-docusate (SENOKOT-S) 8.6-50 MG per tablet Take 1 tablet by mouth 2 (two) times daily. 11/28/13   Bary Leriche, PA-C  warfarin (COUMADIN) 10 MG tablet Take 10 mg by mouth daily. Take 10 mg Mon, Wed , Friday .  Take 15mg  on Sunday, tues, Thurs and Sat    Historical Provider, MD  warfarin (COUMADIN) 10 MG  tablet TAKE AS DIRECTED BY COUMADIN CLINIC 07/24/16   Deboraha Sprang,  MD    Family History Family History  Problem Relation Age of Onset  . Heart disease Mother   . Hypertension Mother   . Diabetes Mother   . Diabetes Father     Social History Social History  Substance Use Topics  . Smoking status: Never Smoker  . Smokeless tobacco: Never Used  . Alcohol use No     Allergies   Patient has no known allergies.   Review of Systems Review of Systems  Constitutional: Negative for chills and fever.  HENT: Negative for ear pain, rhinorrhea and sore throat.   Eyes: Negative for visual disturbance.  Respiratory: Positive for shortness of breath and wheezing. Negative for cough.   Cardiovascular: Positive for chest pain. Negative for palpitations.  Gastrointestinal: Positive for diarrhea and nausea. Negative for abdominal pain and vomiting.  Genitourinary: Positive for decreased urine volume. Negative for dysuria and hematuria.  Musculoskeletal: Negative for back pain and neck pain.  Skin: Negative for color change and rash.  Neurological: Negative for seizures, syncope and headaches.  Psychiatric/Behavioral: Negative for confusion.  All other systems reviewed and are negative.  Physical Exam Updated Vital Signs BP 124/70   Pulse 85   Temp 98.1 F (36.7 C) (Oral)   Resp 20   SpO2 97%   Physical Exam GEN: appears well, no apparent distress. Head: normocephalic and atraumatic  Eyes: conjunctiva without injection, sclera anicteric Oropharynx: mmm without erythema or exudation HEM: negative for cervical or periauricular lymphadenopathies CVS: RRR, nl s1, ?split S2, no murmurs, cap refills < 2 secs, no visible JVD or hepatojugular reflex RESP: no IWOB, good air movement bilaterally, CTAB GI: BS present & normal, nondistended, diffusely tender to palpation, rebound tenderness GU: some suprapubic or no CVA tenderness MSK: no focal tenderness, notable swelling, BKA  bilaterally SKIN: no apparent skin lesion NEURO: alert and oiented appropriately, no gross defecits  PSYCH: euthymic mood with congruent affect  ED Treatments / Results  Labs (all labs ordered are listed, but only abnormal results are displayed) Labs Reviewed  COMPREHENSIVE METABOLIC PANEL - Abnormal; Notable for the following:       Result Value   Chloride 113 (*)    CO2 21 (*)    Glucose, Bld 135 (*)    BUN 29 (*)    Creatinine, Ser 1.60 (*)    Calcium 8.6 (*)    Albumin 3.4 (*)    GFR calc non Af Amer 41 (*)    GFR calc Af Amer 48 (*)    All other components within normal limits  CBC WITH DIFFERENTIAL/PLATELET - Abnormal; Notable for the following:    Hemoglobin 11.3 (*)    HCT 35.0 (*)    MCV 74.9 (*)    MCH 24.2 (*)    RDW 16.6 (*)    All other components within normal limits  I-STAT CHEM 8, ED - Abnormal; Notable for the following:    BUN 32 (*)    Creatinine, Ser 1.60 (*)    Glucose, Bld 134 (*)    Calcium, Ion 1.11 (*)    Hemoglobin 12.6 (*)    HCT 37.0 (*)    All other components within normal limits  URINALYSIS, ROUTINE W REFLEX MICROSCOPIC  I-STAT CG4 LACTIC ACID, ED  Randolm Idol, ED    EKG  EKG Interpretation None       Radiology Dg Chest 2 View  Result Date: 10/13/2016 CLINICAL DATA:  Dizziness. Chest pain. Shortness of breath. Cough. nausea. EXAM: CHEST  2  VIEW COMPARISON:  05/02/2016 FINDINGS: Chronic mild cardiomegaly. Pacemaker/AICD leads in the same approximate position. Re- demonstration of congestive heart failure with venous hypertension, mild interstitial edema and small effusions with dependent atelectasis. No acute bone finding. IMPRESSION: Re- demonstration of heart failure with interstitial edema and effusions. Electronically Signed   By: Nelson Chimes M.D.   On: 10/13/2016 12:31    Procedures Procedures (including critical care time)  Medications Ordered in ED Medications - No data to display   Initial Impression /  Assessment and Plan / ED Course  I have reviewed the triage vital signs and the nursing notes.  Pertinent labs & imaging results that were available during my care of the patient were reviewed by me and considered in my medical decision making (see chart for details).   Patient is 71 year old male with complex cardiac disease who presents with worsening chest pain, shortness of breath and abdominal pain suggestive for CHF exacerbation. EKG unchanged from baseline. Initial troponin negative. Chest x-ray with interstitial edema. BNP elevated to 1000 hard to interpret while on Entresto. Weight up from baseline over the last one month. Doubt infectious etiology without fever or leukocytosis. Lactic acid 1.31 as well. UA negative for UTI. Doubt PE while on warfarin. INR 3.48. Patient reports diarrhea every 1 hour but he has not had bowel movements for the last 12 hours to think C. Difficile. CT abdomen was ordered.   Gave IV Lasix 80 mg once. Patient admitted to the Hospitalist service for CHF exacerbation  Final Clinical Impressions(s) / ED Diagnoses   Final diagnoses:  None    New Prescriptions New Prescriptions   No medications on file     Mercy Riding, MD 10/13/16 1600    Mercy Riding, MD 10/13/16 1603    Tanna Furry, MD 10/13/16 1700

## 2016-10-13 NOTE — ED Notes (Signed)
Spoke with lab about PTINR and BNP they will run it

## 2016-10-13 NOTE — H&P (Signed)
History and Physical    [name] [id number] DOB: [birth year] DOA: 10/13/2016  PCP: [name], MD   Patient coming from: Home  Chief Complaint: SOB, orthopnea, diarrhea  HPI: [name] [name] is a 71 y.o. male with medical history significant of NICM status post ICD, CHF with EF to 20-25% in 2014, DM-2, CKD, PAF on coumadin, bilateral BKA who presented to the ED with c/o left sided throbbing chest pain and SOB during that last two weeks. Patient has been by his cardiologist and Lasix was increased from 40 to 80 mg twice a day, but desoipte this measure he has worsening if orthopnea and during the last two weeks sleeps on two pillows instead of one as before. Patient reported that since 4 am today he has been having worsening of SOB and chest pain. He also c/o nausea and diarrhea that he described as watery and dark stool without fresh blood and feeling like his abdomen is swollen. Patient denied any emesis or abdominal pain on presentation, although approximally two days ago at the onset of symptoms he experienced abdominal a pain and sweating. Since morning he had 4 episodes of diarrhea at home.  Approximately 2 months ago he was treated for pneumonia and diarrhea, since then diarrhea with off-and-on during this time and started worsening over the period of last 2 weeks to the point that patient has watery bowel movement as he has any food Intake His wife also reported the patient having reduced urine output, but he denied any dysuria, hematuria. He feels like his symptoms are getting worse since onset and took imodium and pedialyte with no improvement.  ED Course: on presentation his VS were stable, chest XRay showed interstitial edema and effusions, blood work demonstrated hemoglobin 11.3 and hematocrit 35.0, and 29 and creatinine to 1.60  Cardiac profile showed normal troponin, but elevated BNP to greater than 1000 Abdominal CT is pending  Review of Systems: As per  HPI otherwise 10 point review of systems negative.   Ambulatory Status: Ambulates with prosthesis  Past Medical History:  Diagnosis Date  . Anemia    takes Iron pill daily  . Arthritis   . Automatic implantable cardioverter-defibrillator in situ    a. 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  . CAD (coronary artery disease)   . Cardiomyopathy, nonischemic ([place])    a. Cath 2003: mild nonobstructive CAD, EF 25% at that time.  . Chronic systolic CHF (congestive heart failure) (HCC)    takes Furosemide daily as well as Aldactone  . CKD (chronic kidney disease), stage III   . Complication of anesthesia   . Constipation    takes Sennokot daily  . Dementia   . GERD (gastroesophageal reflux disease)    takes Nexium daily  . Headache    occasionally  . High cholesterol    takes Atorvastatin daily  . History of blood transfusion    no abnormal reaction noted  . History of bronchitis    > 1 yr ago  . History of kidney stones   . Hypertension    takes Coreg daily  . LBBB (left bundle branch block)    takes Coumadin daily  . NSVT (nonsustained ventricular tachycardia) ([place])    a. Noted on ICD interrogation in 2011.  [name] PAD (peripheral artery disease) ([place])    a. 08/2013 Periph Angio/PTA: Abd Ao nl, RLE- 3v runoff, PT diff dzs, AT 90p, LLE 2v runoff, PT 100, AT 48m (diamondback ORA/chocolate balloon PTA).  [name]  PAF (paroxysmal atrial fibrillation) ([place])    a. Noted on ICD interrogation 2012;  b. coumadin d/c'd 01/2013.  [name] PAF (paroxysmal atrial fibrillation) ([place])   . Peripheral neuropathy (HCC)    hands;numbness and tingling   . Pneumonia    hx of > 54yr ago  . PONV (postoperative nausea and vomiting)   . Type II diabetes mellitus ([place])    takes Levemir daily as well as Novolog  . Urinary frequency   . Urinary urgency     Past Surgical History:  Procedure Laterality Date  . ABDOMINAL ANGIOGRAM  09/12/2013   Procedure: ABDOMINAL ANGIOGRAM;  Surgeon:  Lorretta Harp, MD;  Location: [facility] CATH LAB;  Service: Cardiovascular;;  . AMPUTATION Left 10/04/2013   Procedure: LEFT GREAT TOE AND SECOND TOE AMPUTATION;  Surgeon: Marianna Payment, MD;  Location: [place];  Service: Orthopedics;  Laterality: Left;  . AMPUTATION Left 11/09/2013   Procedure: LEFT AMPUTATION BELOW KNEE;  Surgeon: Marianna Payment, MD;  Location: [place];  Service: Orthopedics;  Laterality: Left;  . AMPUTATION Right 01/04/2014   Procedure: Doristine Devoid second and third toe amputation;  Surgeon: Marianna Payment, MD;  Location: [place];  Service: Orthopedics;  Laterality: Right;  . AMPUTATION Right 01/30/2014   Procedure: RIGHT BELOW KNEE AMPUTATION;  Surgeon: Marianna Payment, MD;  Location: [place];  Service: Orthopedics;  Laterality: Right;  . ATHERECTOMY Right 12/29/2013   Procedure: ATHERECTOMY;  Surgeon: Lorretta Harp, MD;  Location: [facility] CATH LAB;  Service: Cardiovascular;  Laterality: Right;  Anterior Tibial Artery  . CARDIAC CATHETERIZATION  10/01/2001   THERE WAS GLOBAL HYPOKINESIS AND EF 25%. THERE APPEARED TO BE GLOBAL DECREASE IN WALL MOTION  . CARDIAC DEFIBRILLATOR PLACEMENT  06/2009   WITH GENERATOR REPLACED; BiV ICD  . CARDIOVASCULAR STRESS TEST  03/20/2009   EF 33%  . [place]  . COLONOSCOPY    . ESOPHAGOGASTRODUODENOSCOPY    . HIP ARTHROPLASTY Left 12/28/2015   Procedure: POSTERIOR  APPROACH HEMI HIP ARTHROPLASTY;  Surgeon: Leandrew Koyanagi, MD;  Location: [place];  Service: Orthopedics;  Laterality: Left;  . LEG AMPUTATION BELOW KNEE Left 11/09/2013   DR Erlinda Hong  . LITHOTRIPSY  2001  . LOWER EXTREMITY ANGIOGRAM Bilateral 09/12/2013   Procedure: LOWER EXTREMITY ANGIOGRAM;  Surgeon: Lorretta Harp, MD;  Location: [facility] CATH LAB;  Service: Cardiovascular;  Laterality: Bilateral;  . LOWER EXTREMITY ANGIOGRAM N/A 12/29/2013   Procedure: LOWER EXTREMITY ANGIOGRAM;  Surgeon: Lorretta Harp, MD;  Location: [facility] CATH LAB;  Service: Cardiovascular;  Laterality: N/A;  . STUMP  REVISION Left 01/03/2015   Procedure: LEFT BELOW KNEE AMPUTATION REVISION;  Surgeon: Leandrew Koyanagi, MD;  Location: [place];  Service: Orthopedics;  Laterality: Left;  . TOE AMPUTATION  10/04/2013   LEFT GREAT TOE AND 4TH TOE   /   DR Erlinda Hong  . TRANSLUMINAL ATHERECTOMY TIBIAL ARTERY Left 09/12/2013  . US ECHOCARDIOGRAPHY  03/21/2008   EF 30-35%    Social History   Social History  . Marital status: Married    Spouse name: N/A  . Number of children: N/A  . Years of education: N/A   Occupational History  . Not on file.   Social History Main Topics  . Smoking status: Never Smoker  . Smokeless tobacco: Never Used  . Alcohol use No  . Drug use: No  . Sexual activity: Not Currently   Other Topics Concern  . Not on file   Social  History Narrative  . No narrative on file    No Known Allergies  Family History  Problem Relation Age of Onset  . Heart disease Mother   . Hypertension Mother   . Diabetes Mother   . Diabetes Father     Prior to Admission medications   Medication Sig Start Date End Date Taking? Authorizing Provider  acetaminophen (TYLENOL) 325 MG tablet Take 2 tablets (650 mg total) by mouth every 6 (six) hours as needed for mild pain (or Fever >/= 101). 05/03/16  Yes Maryann Mikhail, DO  aspirin EC 81 MG tablet Take 81 mg by mouth daily.   Yes Historical Provider, MD  atorvastatin (LIPITOR) 10 MG tablet Take 1 tablet (10 mg total) by mouth daily. 01/15/15  Yes Daniel J Angiulli, PA-C  carvedilol (COREG) 12.5 MG tablet Take 1 tablet (12.5 mg total) by mouth 2 (two) times daily with a meal. 01/15/15  Yes Daniel J Angiulli, PA-C  Cholecalciferol (VITAMIN D3) 10000 units TABS Take 1 tablet by mouth daily.   Yes Historical Provider, MD  furosemide (LASIX) 40 MG tablet Take 1 tablet (40 mg total) by mouth daily. Patient taking differently: Take 40-80 mg by mouth daily. Monday,wednesday,friday, Saturday ( 80 mg 4  Days a week ) all other days 40 mg 01/02/16  Yes Theodis Blaze, MD    insulin detemir (LEVEMIR) 100 UNIT/ML injection 25 unit AM and 35 unit PM Patient taking differently: Inject 25-35 Units into the skin See admin instructions. 25 unit AM and 35 unit PM 01/02/16  Yes Theodis Blaze, MD  iron polysaccharides (NIFEREX) 150 MG capsule Take 1 capsule (150 mg total) by mouth daily. 01/15/15  Yes Daniel J Angiulli, PA-C  Multiple Vitamin (MULTIVITAMIN WITH MINERALS) TABS tablet Take 1 tablet by mouth daily.   Yes Historical Provider, MD  omeprazole (PRILOSEC) 20 MG capsule Take 20 mg by mouth daily.   Yes Historical Provider, MD  pregabalin (LYRICA) 75 MG capsule Take 1 capsule (75 mg total) by mouth 2 (two) times daily. 01/15/15  Yes Daniel J Angiulli, PA-C  sacubitril-valsartan (ENTRESTO) 24-26 MG Take 1 tablet by mouth 2 (two) times daily. 10/31/15  Yes Deboraha Sprang, MD  senna-docusate (SENOKOT-S) 8.6-50 MG per tablet Take 1 tablet by mouth 2 (two) times daily. 11/28/13  Yes Ivan Anchors Love, PA-C  warfarin (COUMADIN) 10 MG tablet Take 10 mg by mouth daily. Take 10 mg Mon, Wed , Friday .  Take 15mg  on Sunday, tues, Thurs and Sat   Yes Historical Provider, MD  warfarin (COUMADIN) 10 MG tablet Take 10-15 mg by mouth daily. Wednesday, Saturday 15 mg and all other days are 10 mg   Yes Historical Provider, MD  Multiple Vitamin (MULTIVITAMIN) tablet Take 1 tablet by mouth daily.    Historical Provider, MD  warfarin (COUMADIN) 10 MG tablet TAKE AS DIRECTED BY COUMADIN CLINIC Patient not taking: Reported on 10/13/2016 07/24/16   Deboraha Sprang, MD    Physical Exam: Vitals:   10/13/16 1445 10/13/16 1515 10/13/16 1600 10/13/16 1615  BP: 121/78 122/80 110/69 114/67  Pulse: 79 69 74 76  Resp: 20 15 22 23   Temp:      TempSrc:      SpO2: 99% 100% 100% 97%     General: Appears calm and comfortable Eyes: PERRLA, EOMI, normal lids, iris ENT:  grossly normal hearing, lips & tongue, mucous membranes moist and intact Neck: no lymphoadenopathy, masses or thyromegaly Cardiovascular:  RRR, no m/r/g. No JVD,  carotid bruits. Bilateral below-knee amputation  Respiratory: bilateral no wheezes, rales, rhonchi or cracles. Normal respiratory effort. No accessory muscle use observed Abdomen: soft, non-tender, non-distended, no organomegaly or masses appreciated. BS present in all quadrants Skin: no rash, ulcers or induration seen on limited exam Musculoskeletal: grossly normal tone BUE good ROM, no bony abnormality or joint deformities observed Psychiatric: grossly normal mood and affect, speech fluent and appropriate, alert and oriented x3 Neurologic: CN II-XII grossly intact, moves all extremities in coordinated fashion, sensation intact  Labs on Admission: I have personally reviewed following labs and imaging studies  CBC, BMP  GFR: CrCl cannot be calculated (Unknown ideal weight.).   Creatinine Clearance: CrCl cannot be calculated (Unknown ideal weight.).   Radiological Exams on Admission: Ct Abdomen Pelvis Wo Contrast  Result Date: 10/13/2016 CLINICAL DATA:  Abdominal pain EXAM: CT ABDOMEN AND PELVIS WITHOUT CONTRAST TECHNIQUE: Multidetector CT imaging of the abdomen and pelvis was performed following the standard protocol without IV contrast. COMPARISON:  02/13/2013 FINDINGS: Lower chest: There is small bilateral pleural effusion. Bilateral lower lobe posterior atelectasis or infiltrate. Partially visualized cardiac pacemaker leads. Cardiomegaly is noted. Hepatobiliary: Unenhanced liver the shows no biliary ductal dilatation. No focal hepatic abnormality. Again noted layering gallbladder sludge or tiny calcified gallstones within gallbladder fundal region. Pancreas: Unenhanced pancreas is atrophic fatty replaced. Stable 1.3 cm hyperdense nodule in the region of pancreatic tail probable accessory splenule. Spleen: Stable small accessory splenules. Unenhanced spleen without focal abnormality. Adrenals/Urinary Tract: No adrenal gland mass. Unenhanced kidneys shows no  nephrolithiasis. No hydronephrosis or hydroureter. No calcified ureteral calculi are noted bilaterally. There is moderate distended urinary bladder. No urinary bladder filling defects. Limited evaluation of the pelvis due to metallic artifacts from left hip prosthesis. Nonspecific mild bilateral perinephric stranding. Stomach/Bowel: No small bowel obstruction. No gastric outlet obstruction. No pericecal inflammation. Normal appendix partially visualized in axial image 53. Some colonic stool noted in right colon. Some colonic stool and gas noted within transverse colon and descending colon without significant colonic distension. Few diverticula are noted descending colon proximal sigmoid colon. No definite evidence of acute colitis or diverticulitis. Some colonic gas noted proximal sigmoid colon. No distal colonic obstruction. Vascular/Lymphatic: Atherosclerotic calcifications of abdominal aorta and iliac arteries. No aortic aneurysm. Reproductive: No pelvic masses noted. Prostate gland measures 4.3 x 2.9 cm within normal limits. Other: No ascites or free abdominal air. Mild anasarca infiltration of subcutaneous fat bilateral flank wall and anterior abdominal wall. There is a umbilical hernia containing omental fat best seen in sagittal image 93 measures 2 cm without evidence of acute complication. Mild skin thickening and subcutaneous stranding in lower anterior abdominal wall axial image 56 probable mild dermatitis or subcutaneous edema. Musculoskeletal: No destructive bony lesions are noted. There is disc space flattening with mild vacuum disc phenomenon and mild posterior disc bulge at L3-L4 level. There are metallic artifacts from left hip prosthesis. IMPRESSION: 1. Small bilateral pleural effusion. Bilateral lower lobe posterior atelectasis or infiltrate. 2. No nephrolithiasis. No hydronephrosis or hydroureter. Nonspecific mild bilateral perinephric stranding. No calcified ureteral calculi. 3. There is moderate  distended urinary bladder without urinary bladder filling defects. No calcified calculi. Please note the urinary bladder measures at least 17 cm in length cranial caudally. 4. Normal appendix.  No pericecal inflammation. 5. No small bowel or colonic obstruction. Few diverticula are noted descending colon proximal sigmoid colon. No definite evidence of acute colitis or diverticulitis. 6. Limited evaluation of the pelvis due to metallic artifacts from left hip  prosthesis. 7. There is disc space flattening with mild vacuum disc phenomenon and mild posterior disc bulge at L2-L3 level. 8. Mild anasarca infiltration of subcutaneous fat abdominal and pelvic wall. 9. There is a umbilical hernia containing omental fat best seen in sagittal image 93 measures 2 cm without evidence of acute complication. Mild skin thickening and subcutaneous stranding in lower anterior abdominal wall axial image 56 probable mild dermatitis or subcutaneous edema. Electronically Signed   By: Lahoma Crocker M.D.   On: 10/13/2016 16:57   Dg Chest 2 View  Result Date: 10/13/2016 CLINICAL DATA:  Dizziness. Chest pain. Shortness of breath. Cough. nausea. EXAM: CHEST  2 VIEW COMPARISON:  05/02/2016 FINDINGS: Chronic mild cardiomegaly. Pacemaker/AICD leads in the same approximate position. Re- demonstration of congestive heart failure with venous hypertension, mild interstitial edema and small effusions with dependent atelectasis. No acute bone finding. IMPRESSION: Re- demonstration of heart failure with interstitial edema and effusions. Electronically Signed   By: Nelson Chimes M.D.   On: 10/13/2016 12:31    EKG: Independently reviewed - paced rhythm   Assessment/Plan Principal Problem:   Systolic CHF, acute on chronic (HCC) Active Problems:   HTN (hypertension)   Diabetes mellitus ([place])   Hyperlipidemia   Atrial fibrillation [I48.91]   Acute renal failure superimposed on stage 3 chronic kidney disease (HCC)   Anemia of chronic disease    Diarrhea    Acute on chronic exacerbation of systolic CHF Will hold oral diuretic and Entresto, continue Lasix IV that was started in the ED Monitor UOP, daily weights, maintain on low salt diet Consider CHF team consult next am  Diarrhea Will order CDiff and GI panel to r/o infectious etiology, although could be associated with decompensated CHF Abdominal CT is pending  Hypertension - currently stable Continue home medication and adjust the doses if needed depending on the BP readings  DM type II - most recent HgbA1C is 9.9% in May 2017  Continue diabetic diet, monitor FSBS QACHS, add sliding scale insulin  CKD - stage III, now with acute renal insufficiency Closely monitor renal indices as patient is now in increased dose of Lasix Currently creatinine  elevated slightly above baseline  - 1.60 and was 1.34  Last month Hold Entresto  Continue Coumadin per pharmacy Monitor heart rate on telemetry   DVT prophylaxis: Systemic Code Status: full Family Communication: at bedside  Disposition Plan: telemetry Consults called: none Admission status: inpatient   York Grice, [place] Pager: [phone] [facility]  If 7PM-7AM, please contact night-coverage [email or web address] Password [email or web address]  10/13/2016, 5:22 PM

## 2016-10-13 NOTE — ED Triage Notes (Signed)
Pt presents with c/o CP and abdominal pain. The pain began about 2 days ago, initially in his abdomen. He c/o sweats, cough, sob, diarrhea. He denies nausea, vomiting, urinary changes. He feels that his symptoms are getting worse since onset. He took imodium and pedialyte with no improvement.

## 2016-10-13 NOTE — Progress Notes (Signed)
[place] for warfarin Indication: atrial fibrillation   Assessment: 49 yom with history of PAF on warfarin PTA. Pharmacy consulted to dose inpatient. INR supratherapeutic at 3.48 on admit. Hg 12.6, plt WNL. No bleed documented.  PTA warfarin dose: 10mg  daily except 15mg  on Wed/Sat (last dose 2/18)  Goal of Therapy:  INR 2-3 Monitor platelets by anticoagulation protocol: Yes   Plan:  Hold warfarin tonight Daily INR Monitor CBC, s/sx bleeding   Elicia Lamp, PharmD, BCPS Clinical Pharmacist 10/13/2016 5:38 PM

## 2016-10-14 ENCOUNTER — Inpatient Hospital Stay (HOSPITAL_COMMUNITY): Payer: Medicare Other

## 2016-10-14 DIAGNOSIS — R0600 Dyspnea, unspecified: Secondary | ICD-10-CM

## 2016-10-14 LAB — ECHOCARDIOGRAM COMPLETE
AV Area mean vel: 3.71 cm2
AV VEL mean LVOT/AV: 0.82
AV area mean vel ind: 1.77 cm2/m2
AV vel: 3.86
AVA: 3.86 cm2
AVAREAVTI: 3.7 cm2
AVAREAVTIIND: 1.85 cm2/m2
AVG: 3 mmHg
AVPG: 5 mmHg
AVPKVEL: 110 cm/s
Ao pk vel: 0.82 m/s
CHL CUP AV PEAK INDEX: 1.77
CHL CUP DOP CALC LVOT VTI: 16.4 cm
CHL CUP MV DEC (S): 127
DOP CAL AO MEAN VELOCITY: 72.1 cm/s
E/e' ratio: 13.8
EWDT: 127 ms
FS: 18 % — AB (ref 28–44)
Height: 69 in
IV/PV OW: 0.96
LA diam end sys: 48 mm
LA diam index: 2.3 cm/m2
LA vol A4C: 85.8 ml
LA vol: 107 mL
LASIZE: 48 mm
LAVOLIN: 51.2 mL/m2
LDCA: 4.52 cm2
LV PW d: 11.7 mm — AB (ref 0.6–1.1)
LV TDI E'MEDIAL: 4.88
LV e' LATERAL: 5.63 cm/s
LV sys vol index: 58 mL/m2
LV sys vol: 121 mL — AB (ref 21–61)
LVDIAVOL: 182 mL — AB (ref 62–150)
LVDIAVOLIN: 87 mL/m2
LVEEAVG: 13.8
LVEEMED: 13.8
LVOT SV: 74 mL
LVOT peak VTI: 0.85 cm
LVOT peak vel: 90 cm/s
LVOTD: 24 mm
MV pk A vel: 56.9 m/s
MVPG: 2 mmHg
MVPKEVEL: 77.7 m/s
RV LATERAL S' VELOCITY: 9.69 cm/s
RV TAPSE: 18.8 mm
RV sys press: 36 mmHg
Reg peak vel: 286 cm/s
Simpson's disk: 34
Stroke v: 61 ml
TDI e' lateral: 5.63
TR max vel: 286 cm/s
VTI: 19.2 cm
Valve area index: 1.85
Weight: 3300.8 oz

## 2016-10-14 LAB — COMPREHENSIVE METABOLIC PANEL
ALK PHOS: 58 U/L (ref 38–126)
ALT: 39 U/L (ref 17–63)
AST: 17 U/L (ref 15–41)
Albumin: 3 g/dL — ABNORMAL LOW (ref 3.5–5.0)
Anion gap: 8 (ref 5–15)
BUN: 27 mg/dL — ABNORMAL HIGH (ref 6–20)
CALCIUM: 8.4 mg/dL — AB (ref 8.9–10.3)
CHLORIDE: 108 mmol/L (ref 101–111)
CO2: 25 mmol/L (ref 22–32)
CREATININE: 1.47 mg/dL — AB (ref 0.61–1.24)
GFR calc Af Amer: 53 mL/min — ABNORMAL LOW (ref >60)
GFR calc non Af Amer: 46 mL/min — ABNORMAL LOW (ref >60)
Glucose, Bld: 328 mg/dL — ABNORMAL HIGH (ref 65–99)
Potassium: 3.5 mmol/L (ref 3.5–5.1)
SODIUM: 141 mmol/L (ref 135–145)
Total Bilirubin: 0.5 mg/dL (ref 0.3–1.2)
Total Protein: 5.7 g/dL — ABNORMAL LOW (ref 6.5–8.1)

## 2016-10-14 LAB — GLUCOSE, CAPILLARY
GLUCOSE-CAPILLARY: 249 mg/dL — AB (ref 65–99)
GLUCOSE-CAPILLARY: 215 mg/dL — AB (ref 65–99)
Glucose-Capillary: 177 mg/dL — ABNORMAL HIGH (ref 65–99)
Glucose-Capillary: 197 mg/dL — ABNORMAL HIGH (ref 65–99)

## 2016-10-14 LAB — PROTIME-INR
INR: 3.59
Prothrombin Time: 36.7 seconds — ABNORMAL HIGH (ref 11.4–15.2)

## 2016-10-14 MED ORDER — WARFARIN - PHARMACIST DOSING INPATIENT: Freq: Every day | Status: DC

## 2016-10-14 MED ORDER — ONDANSETRON HCL 4 MG/2ML IJ SOLN
4.0000 mg | Freq: Four times a day (QID) | INTRAMUSCULAR | Status: DC | PRN
  Administered 2016-10-14 – 2016-10-19 (×4): 4 mg via INTRAVENOUS
  Filled 2016-10-14 (×4): qty 2

## 2016-10-14 MED ORDER — FUROSEMIDE 10 MG/ML IJ SOLN
80.0000 mg | Freq: Two times a day (BID) | INTRAMUSCULAR | Status: DC
  Administered 2016-10-15 – 2016-10-16 (×3): 80 mg via INTRAVENOUS
  Filled 2016-10-14 (×3): qty 8

## 2016-10-14 NOTE — Progress Notes (Addendum)
Foley catheter successfully inserted per MD order, pt tolerated well, Foley catheter care is done, will continue to monitor the patient.  Palma Holter, RN

## 2016-10-14 NOTE — Progress Notes (Addendum)
Patient ID: [name] [name], male   DOB: [birth year], 71 y.o.   MRN: [id number]    PROGRESS NOTE  MANVEER SORLIE  [id number] DOB: [birth year] DOA: 10/13/2016  PCP: [name], MD   Brief Narrative:  71 y.o. male with medical history significant of NICM status post ICD, CHF with EF to 20-25% in 2014, DM-2, CKD, PAF on coumadin, bilateral BKA who presented to the ED with c/o left sided throbbing chest pain and SOB during that last two weeks. Patient has been seen by his cardiologist and Lasix was increased from 40 to 80 mg twice a day, but despite this measure he has worsening orthopnea and during the last two weeks sleeps on two pillows instead of one as before.  Assessment & Plan:  Acute on chronic exacerbation of systolic CHF - pt currently on lasix 80 mg IV BID - holding Entresto that pt takes at home  - this is the weight trend since admission Filed Weights   10/13/16 1732 10/14/16 0501  Weight: 94.5 kg (208 lb 6.4 oz) 93.6 kg (206 lb 4.8 oz)  - continue to monitor daily weights, strict I/O - Cr is overall trending down  - also continue Coreg per home medical regimen  - ECHO requested, once results available, will can consider cardio consult   Diarrhea - no signs of diverticulitis on CT abd - no diarrhea this AM, unable to obtain C. 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  B BKA - PT  eval requested   DVT prophylaxis: on Coumadin  Code Status: Full  Family Communication: Patient at bedside  Disposition Plan: Home once able to take off IV Lasix   Consultants:   None  Procedures:   None  Antimicrobials:   None  Subjective: Pt reports feeling better but still with exertional dyspnea.   Objective: Vitals:   10/13/16 1947 10/14/16 0057 10/14/16 0501 10/14/16 0955  BP: (!) 120/58 (!) 116/46 (!) 114/58 (!) 112/59  Pulse: 83 78 76 77  Resp: 20 20 20    Temp: 98.2 F (36.8 C) 98.5 F (36.9 C) 98.4 F (36.9 C)   TempSrc: Oral Oral Oral   SpO2: 96% 94% 97%   Weight:   93.6 kg (206 lb 4.8 oz)   Height:        Intake/Output Summary (Last 24 hours) at 10/14/16 1310 Last data filed at 10/14/16 1153  Gross per 24 hour  Intake              775 ml  Output             5600 ml  Net            -4825 ml   Filed Weights   10/13/16 1732 10/14/16 0501  Weight: 94.5 kg (208 lb 6.4 oz) 93.6 kg (206 lb 4.8  oz)   Examination:  General exam: Appears calm and comfortable  Respiratory system: diminished breath sounds at bases with crackles  Cardiovascular system: S1 & S2 heard, RRR. B BKA Gastrointestinal system: Abdomen is nondistended, soft and nontender.  Central nervous system: Alert and oriented. No focal neurological deficits. Psychiatry: Judgement and insight appear normal. Mood & affect appropriate.   Data Reviewed: I have personally reviewed following labs and imaging studies  CBC:  Recent Labs Lab 10/13/16 1213 10/13/16 1239  WBC 4.4  --   NEUTROABS 3.4  --   HGB 11.3* 12.6*  HCT 35.0* 37.0*  MCV 74.9*  --   PLT 155  --    Basic Metabolic Panel:  Recent Labs Lab 10/13/16 1213 10/13/16 1239 10/13/16 1749 10/14/16 0402  NA 144 145  --  141  K 4.0 4.1  --  3.5  CL 113* 111  --  108  CO2 21*  --   --  25  GLUCOSE 135* 134*  --  328*  BUN 29* 32*  --  27*  CREATININE 1.60* 1.60*  --  1.47*  CALCIUM 8.6*  --   --  8.4*  MG  --   --  1.9   --    \Liver Function Tests:  Recent Labs Lab 10/13/16 1213 10/14/16 0402  AST 26 17  ALT 53 39  ALKPHOS 71 58  BILITOT 0.4 0.5  PROT 6.6 5.7*  ALBUMIN 3.4* 3.0*   Coagulation Profile:  Recent Labs Lab 10/13/16 1343 10/14/16 0402  INR 3.48 3.59   CBG:  Recent Labs Lab 10/13/16 1524 10/13/16 2107 10/14/16 0737 10/14/16 1151  GLUCAP 104* 244* 249* 215*   Urine analysis:    Component Value Date/Time   COLORURINE STRAW (A) 10/13/2016 1428   APPEARANCEUR CLEAR 10/13/2016 1428   LABSPEC 1.010 10/13/2016 1428   PHURINE 5.0 10/13/2016 1428   GLUCOSEU 50 (A) 10/13/2016 1428   HGBUR MODERATE (A) 10/13/2016 1428   BILIRUBINUR NEGATIVE 10/13/2016 1428   KETONESUR NEGATIVE 10/13/2016 1428   PROTEINUR 30 (A) 10/13/2016 1428   UROBILINOGEN 0.2 01/11/2015 1850   NITRITE NEGATIVE 10/13/2016 1428   LEUKOCYTESUR NEGATIVE 10/13/2016 1428   Radiology Studies: Ct Abdomen Pelvis Wo Contrast Result Date: 10/13/2016 1. Small bilateral pleural effusion. Bilateral lower lobe posterior atelectasis or infiltrate.  2. No nephrolithiasis. No hydronephrosis or hydroureter. Nonspecific mild bilateral perinephric stranding. No calcified ureteral calculi.  3. There is moderate distended urinary bladder without urinary bladder filling defects. No calcified calculi. Please note the urinary bladder measures at least 17 cm in length cranial caudally.  4. Normal appendix.  No pericecal inflammation.  5. No small bowel or colonic obstruction. Few diverticula are noted descending colon proximal sigmoid colon. No definite evidence of acute colitis or diverticulitis.  6. Limited evaluation of the pelvis due to metallic artifacts from left hip prosthesis.  7. There is disc space flattening with mild vacuum disc phenomenon and mild posterior disc bulge at L2-L3 level.  8. Mild anasarca infiltration of subcutaneous fat abdominal and pelvic wall.  9. There is a umbilical hernia containing omental fat  best seen in sagittal image 93 measures 2 cm without evidence of acute complication. Mild skin thickening and subcutaneous stranding in lower anterior abdominal wall axial image 56 probable mild dermatitis or subcutaneous edema.   Dg Chest 2 View Result Date: 10/13/2016 Re- demonstration of heart failure with interstitial edema and effusions.   Scheduled Meds: . aspirin EC  81 mg Oral Daily  .  atorvastatin  10 mg Oral Daily  . carvedilol  12.5 mg Oral BID WC  . furosemide  80 mg Intravenous Q12H  . insulin aspart  0-9 Units Subcutaneous TID WC  . insulin detemir  25 Units Subcutaneous BID  . iron polysaccharides  150 mg Oral Daily  . pregabalin  75 mg Oral BID  . senna-docusate  1 tablet Oral BID  . [START ON 10/15/2016] Warfarin - Pharmacist Dosing Inpatient   Does not apply q1800   Continuous Infusions:   LOS: 1 day   Time spent: 20 minutes   Faye Ramsay, MD [facility] Pager [phone]  If 7PM-7AM, please contact night-coverage [email or web address] Password [email or web address] 10/14/2016, 1:10 PM

## 2016-10-14 NOTE — Progress Notes (Signed)
[place] for warfarin Indication: atrial fibrillation  Assessment: 35 yom with history of PAF on warfarin PTA. Pharmacy consulted to dose inpatient. INR remains supratherapeutic at 3.59 after holding last night's dose.  PTA warfarin dose: 10mg  daily except 15mg  on Wed/Sat (last dose 2/18)  Goal of Therapy:  INR 2-3 Monitor platelets by anticoagulation protocol: Yes   Plan:  Hold warfarin tonight Daily INR Monitor CBC, s/sx bleeding   Andrey Cota. Diona Foley, PharmD, BCPS Clinical Pharmacist [phone] 10/14/2016 8:39 AM

## 2016-10-15 DIAGNOSIS — I5023 Acute on chronic systolic (congestive) heart failure: Secondary | ICD-10-CM

## 2016-10-15 LAB — BASIC METABOLIC PANEL
ANION GAP: 14 (ref 5–15)
BUN: 22 mg/dL — ABNORMAL HIGH (ref 6–20)
CALCIUM: 8.7 mg/dL — AB (ref 8.9–10.3)
CO2: 25 mmol/L (ref 22–32)
Chloride: 101 mmol/L (ref 101–111)
Creatinine, Ser: 1.47 mg/dL — ABNORMAL HIGH (ref 0.61–1.24)
GFR, EST AFRICAN AMERICAN: 53 mL/min — AB (ref >60)
GFR, EST NON AFRICAN AMERICAN: 46 mL/min — AB (ref >60)
Glucose, Bld: 240 mg/dL — ABNORMAL HIGH (ref 65–99)
Potassium: 3.5 mmol/L (ref 3.5–5.1)
SODIUM: 140 mmol/L (ref 135–145)

## 2016-10-15 LAB — MAGNESIUM: MAGNESIUM: 2 mg/dL (ref 1.7–2.4)

## 2016-10-15 LAB — CBC
HCT: 38.5 % — ABNORMAL LOW (ref 39.0–52.0)
Hemoglobin: 12.5 g/dL — ABNORMAL LOW (ref 13.0–17.0)
MCH: 24.3 pg — ABNORMAL LOW (ref 26.0–34.0)
MCHC: 32.5 g/dL (ref 30.0–36.0)
MCV: 74.9 fL — ABNORMAL LOW (ref 78.0–100.0)
Platelets: 162 10*3/uL (ref 150–400)
RBC: 5.14 MIL/uL (ref 4.22–5.81)
RDW: 16.7 % — AB (ref 11.5–15.5)
WBC: 7.5 10*3/uL (ref 4.0–10.5)

## 2016-10-15 LAB — GLUCOSE, CAPILLARY
GLUCOSE-CAPILLARY: 243 mg/dL — AB (ref 65–99)
GLUCOSE-CAPILLARY: 196 mg/dL — AB (ref 65–99)
Glucose-Capillary: 230 mg/dL — ABNORMAL HIGH (ref 65–99)
Glucose-Capillary: 217 mg/dL — ABNORMAL HIGH (ref 65–99)

## 2016-10-15 LAB — PROTIME-INR
INR: 1.87
PROTHROMBIN TIME: 21.8 s — AB (ref 11.4–15.2)

## 2016-10-15 MED ORDER — GUAIFENESIN-DM 100-10 MG/5ML PO SYRP
5.0000 mL | ORAL_SOLUTION | ORAL | Status: DC | PRN
  Filled 2016-10-15 (×2): qty 5

## 2016-10-15 MED ORDER — BISACODYL 10 MG RE SUPP
10.0000 mg | Freq: Once | RECTAL | Status: AC
  Administered 2016-10-15: 10 mg via RECTAL
  Filled 2016-10-15: qty 1

## 2016-10-15 MED ORDER — POLYETHYLENE GLYCOL 3350 17 G PO PACK
17.0000 g | PACK | Freq: Every day | ORAL | Status: DC
  Administered 2016-10-15 – 2016-10-20 (×4): 17 g via ORAL
  Filled 2016-10-15 (×6): qty 1

## 2016-10-15 MED ORDER — SACUBITRIL-VALSARTAN 24-26 MG PO TABS
1.0000 | ORAL_TABLET | Freq: Two times a day (BID) | ORAL | Status: DC
  Administered 2016-10-16 (×3): 1 via ORAL
  Filled 2016-10-15 (×5): qty 1

## 2016-10-15 MED ORDER — WARFARIN SODIUM 7.5 MG PO TABS: 15.0000 mg | ORAL_TABLET | Freq: Once | ORAL | Status: DC

## 2016-10-15 MED ORDER — INSULIN DETEMIR 100 UNIT/ML ~~LOC~~ SOLN
30.0000 [IU] | Freq: Two times a day (BID) | SUBCUTANEOUS | Status: DC
  Administered 2016-10-15 – 2016-10-16 (×3): 30 [IU] via SUBCUTANEOUS
  Filled 2016-10-15 (×4): qty 0.3

## 2016-10-15 MED ORDER — PROMETHAZINE HCL 25 MG/ML IJ SOLN
12.5000 mg | Freq: Four times a day (QID) | INTRAMUSCULAR | Status: DC | PRN
  Administered 2016-10-15 (×2): 12.5 mg via INTRAVENOUS
  Filled 2016-10-15 (×2): qty 1

## 2016-10-15 MED ORDER — SPIRONOLACTONE 25 MG PO TABS
12.5000 mg | ORAL_TABLET | Freq: Every day | ORAL | Status: DC
  Administered 2016-10-16: 12.5 mg via ORAL
  Filled 2016-10-15: qty 1

## 2016-10-15 NOTE — Progress Notes (Signed)
Pt resting well since receiving pheregan.  Pt has not eaten anything all day and has not vomited since 1500.  Pts wife requesting we hold off on PO medications for now.  She stated that she does not want pt to start throwing up again.  Will do as requested and only give IV lasix this evening.  Will continue to monitor.

## 2016-10-15 NOTE — Progress Notes (Signed)
Pt vomiting and having abdominal pain.  MD paged, new orders given. Will carry out MD orders and continue to monitor.

## 2016-10-15 NOTE — Consult Note (Signed)
ELECTROPHYSIOLOGY CONSULT NOTE  Patient ID: [name] [name], MRN: [id number], DOB/AGE: August 18, 1946 71 y.o. Admit date: 10/13/2016 Date of Consult: 10/15/2016  Primary Physician: [name], MD Primary Cardiologist: *CHF SK  Chief Complaint: VT   HPI [name] [name] is a 71 y.o. male  Is noted to have slow ventricular tachycardia occurring below his detection zone.  He has a history of a nonischemic cardiomyopathy with depressed left ventricular function paroxysmal atrial fibrillation and on Coumadin. He presented with increasing shortness of breath 2 pillow orthopnea. He is status post bilateral BKA. Echo EF was 20-25% which is unchanged since 2014.  He was noted to/2/18 to have nonsustained ventricular tachycardia below his VT zone and the device was reprogrammed down to a lower rate of 1 60 bpm for detection. As she had another episode of VT just prior to catheterization  Tonight he is extremely somnolent and complaining of abd pain   Past Medical History:  Diagnosis Date  . Anemia    takes Iron pill daily  . Arthritis   . Automatic implantable cardioverter-defibrillator in situ    a. 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  . CAD (coronary artery disease)   . Cardiomyopathy, nonischemic ([place])    a. Cath 2003: mild nonobstructive CAD, EF 25% at that time.  . Chronic systolic CHF (congestive heart failure) (HCC)    takes Furosemide daily as well as Aldactone  . CKD (chronic kidney disease), stage III   . Complication of anesthesia   . Constipation    takes Sennokot daily  . Dementia   . GERD (gastroesophageal reflux disease)    takes Nexium daily  . Headache    occasionally  . High cholesterol    takes Atorvastatin daily  . History of blood transfusion    no abnormal reaction noted  . History of bronchitis    > 1 yr ago  . History of kidney stones   . Hypertension    takes Coreg daily  . LBBB (left bundle branch  block)    takes Coumadin daily  . NSVT (nonsustained ventricular tachycardia) ([place])    a. Noted on ICD interrogation in 2011.  [name] PAD (peripheral artery disease) ([place])    a. 08/2013 Periph Angio/PTA: Abd Ao nl, RLE- 3v runoff, PT diff dzs, AT 90p, LLE 2v runoff, PT 100, AT 71m (diamondback ORA/chocolate balloon PTA).  [name] PAF (paroxysmal atrial fibrillation) ([place])    a. Noted on ICD interrogation 2012;  b. coumadin d/c'd 01/2013.  [name] PAF (paroxysmal atrial fibrillation) ([place])   . Peripheral neuropathy (HCC)    hands;numbness and tingling   . Pneumonia    hx of > 3yr ago  . PONV (postoperative nausea and vomiting)   . Type II diabetes mellitus ([place])    takes Levemir daily as well as Novolog  . Urinary frequency   . Urinary urgency       Surgical History:  Past Surgical History:  Procedure Laterality Date  . ABDOMINAL ANGIOGRAM  09/12/2013   Procedure: ABDOMINAL ANGIOGRAM;  Surgeon: Lorretta Harp, MD;  Location: [facility] CATH LAB;  Service: Cardiovascular;;  . AMPUTATION Left 10/04/2013   Procedure: LEFT GREAT TOE AND SECOND TOE AMPUTATION;  Surgeon: Marianna Payment, MD;  Location: [place];  Service: Orthopedics;  Laterality: Left;  . AMPUTATION Left 11/09/2013   Procedure: LEFT AMPUTATION BELOW KNEE;  Surgeon: Marianna Payment, MD;  Location: [place];  Service: Orthopedics;  Laterality: Left;  . AMPUTATION  Right 01/04/2014   Procedure: Doristine Devoid second and third toe amputation;  Surgeon: Marianna Payment, MD;  Location: [place];  Service: Orthopedics;  Laterality: Right;  . AMPUTATION Right 01/30/2014   Procedure: RIGHT BELOW KNEE AMPUTATION;  Surgeon: Marianna Payment, MD;  Location: [place];  Service: Orthopedics;  Laterality: Right;  . ATHERECTOMY Right 12/29/2013   Procedure: ATHERECTOMY;  Surgeon: Lorretta Harp, MD;  Location: [facility] CATH LAB;  Service: Cardiovascular;  Laterality: Right;  Anterior Tibial Artery  . CARDIAC CATHETERIZATION  10/01/2001   THERE WAS GLOBAL HYPOKINESIS AND EF 25%. THERE  APPEARED TO BE GLOBAL DECREASE IN WALL MOTION  . CARDIAC DEFIBRILLATOR PLACEMENT  06/2009   WITH GENERATOR REPLACED; BiV ICD  . CARDIOVASCULAR STRESS TEST  03/20/2009   EF 33%  . [place]  . COLONOSCOPY    . ESOPHAGOGASTRODUODENOSCOPY    . HIP ARTHROPLASTY Left 12/28/2015   Procedure: POSTERIOR  APPROACH HEMI HIP ARTHROPLASTY;  Surgeon: Leandrew Koyanagi, MD;  Location: [place];  Service: Orthopedics;  Laterality: Left;  . LEG AMPUTATION BELOW KNEE Left 11/09/2013   DR Erlinda Hong  . LITHOTRIPSY  2001  . LOWER EXTREMITY ANGIOGRAM Bilateral 09/12/2013   Procedure: LOWER EXTREMITY ANGIOGRAM;  Surgeon: Lorretta Harp, MD;  Location: [facility] CATH LAB;  Service: Cardiovascular;  Laterality: Bilateral;  . LOWER EXTREMITY ANGIOGRAM N/A 12/29/2013   Procedure: LOWER EXTREMITY ANGIOGRAM;  Surgeon: Lorretta Harp, MD;  Location: [facility] CATH LAB;  Service: Cardiovascular;  Laterality: N/A;  . STUMP REVISION Left 01/03/2015   Procedure: LEFT BELOW KNEE AMPUTATION REVISION;  Surgeon: Leandrew Koyanagi, MD;  Location: [place];  Service: Orthopedics;  Laterality: Left;  . TOE AMPUTATION  10/04/2013   LEFT GREAT TOE AND 4TH TOE   /   DR Erlinda Hong  . TRANSLUMINAL ATHERECTOMY TIBIAL ARTERY Left 09/12/2013  . US ECHOCARDIOGRAPHY  03/21/2008   EF 30-35%     Home Meds: Prior to Admission medications   Medication Sig Start Date End Date Taking? Authorizing Provider  acetaminophen (TYLENOL) 325 MG tablet Take 2 tablets (650 mg total) by mouth every 6 (six) hours as needed for mild pain (or Fever >/= 101). 05/03/16  Yes Maryann Mikhail, DO  aspirin EC 81 MG tablet Take 81 mg by mouth daily.   Yes Historical Provider, MD  atorvastatin (LIPITOR) 10 MG tablet Take 1 tablet (10 mg total) by mouth daily. 01/15/15  Yes Daniel J Angiulli, PA-C  carvedilol (COREG) 12.5 MG tablet Take 1 tablet (12.5 mg total) by mouth 2 (two) times daily with a meal. 01/15/15  Yes Daniel J Angiulli, PA-C  Cholecalciferol (VITAMIN D3) 10000 units TABS Take 1  tablet by mouth daily.   Yes Historical Provider, MD  furosemide (LASIX) 40 MG tablet Take 1 tablet (40 mg total) by mouth daily. Patient taking differently: Take 40-80 mg by mouth daily. Monday,wednesday,friday, Saturday ( 80 mg 4  Days a week ) all other days 40 mg 01/02/16  Yes Theodis Blaze, MD  insulin detemir (LEVEMIR) 100 UNIT/ML injection 25 unit AM and 35 unit PM Patient taking differently: Inject 25-35 Units into the skin See admin instructions. 25 unit AM and 35 unit PM 01/02/16  Yes Theodis Blaze, MD  iron polysaccharides (NIFEREX) 150 MG capsule Take 1 capsule (150 mg total) by mouth daily. 01/15/15  Yes Daniel J Angiulli, PA-C  Multiple Vitamin (MULTIVITAMIN WITH MINERALS) TABS tablet Take 1 tablet by mouth daily.   Yes Historical Provider,  MD  omeprazole (PRILOSEC) 20 MG capsule Take 20 mg by mouth daily.   Yes Historical Provider, MD  pregabalin (LYRICA) 75 MG capsule Take 1 capsule (75 mg total) by mouth 2 (two) times daily. 01/15/15  Yes Daniel J Angiulli, PA-C  sacubitril-valsartan (ENTRESTO) 24-26 MG Take 1 tablet by mouth 2 (two) times daily. 10/31/15  Yes Deboraha Sprang, MD  senna-docusate (SENOKOT-S) 8.6-50 MG per tablet Take 1 tablet by mouth 2 (two) times daily. 11/28/13  Yes Ivan Anchors Love, PA-C  warfarin (COUMADIN) 10 MG tablet Take 10-15 mg by mouth daily. Wednesday, Saturday 15 mg and all other days are 10 mg   Yes Historical Provider, MD  warfarin (COUMADIN) 10 MG tablet TAKE AS DIRECTED BY COUMADIN CLINIC Patient not taking: Reported on 10/13/2016 07/24/16   Deboraha Sprang, MD    Inpatient Medications:  . aspirin EC  81 mg Oral Daily  . atorvastatin  10 mg Oral Daily  . carvedilol  12.5 mg Oral BID WC  . furosemide  80 mg Intravenous BID  . insulin aspart  0-9 Units Subcutaneous TID WC  . insulin detemir  30 Units Subcutaneous BID  . polyethylene glycol  17 g Oral Daily  . pregabalin  75 mg Oral BID  . sacubitril-valsartan  1 tablet Oral BID  . senna-docusate  1 tablet  Oral BID  . spironolactone  12.5 mg Oral Daily  . warfarin  15 mg Oral ONCE-1800  . Warfarin - Pharmacist Dosing Inpatient   Does not apply q1800    v  Allergies: No Known Allergies  Social History   Social History  . Marital status: Married    Spouse name: N/A  . Number of children: N/A  . Years of education: N/A   Occupational History  . Not on file.   Social History Main Topics  . Smoking status: Never Smoker  . Smokeless tobacco: Never Used  . Alcohol use No  . Drug use: No  . Sexual activity: Not Currently   Other Topics Concern  . Not on file   Social History Narrative  . No narrative on file     Family History  Problem Relation Age of Onset  . Heart disease Mother   . Hypertension Mother   . Diabetes Mother   . Diabetes Father      ROS:  Please see the history of present illness.     All other systems reviewed and negative.    Physical Exam:  Blood pressure 135/74, pulse 92, temperature 99.3 F (37.4 C), temperature source Oral, resp. rate 18, height 5\' 9"  (1.753 m), weight 204 lb 14.4 oz (92.9 kg), SpO2 94 %. General: Well developed, well nourished male  Somnolent complaining of abd pain  Head: Normocephalic, atraumatic, sclera non-icteric, no xanthomas, nares are without discharge. EENT: normal Lymph Nodes:  none Back: without scoliosis/kyphosis, no CVA tendersness Device pocket well healed; without hematoma or erythema.  There is no tethering Neck: Negative for carotid bruits. JVD 6-7 Lungs: Clear bilaterally to auscultation without wheezes, rales, or rhonchi. Breathing is unlabored. Heart: RRR with S1 S2. 2/6 systolic murmur , rubs, or gallops appreciated. Abdomen: Soft, mildly-tender, non-distended with normoactive bowel sounds. No hepatomegaly. No rebound/guarding. No obvious abdominal masses. Msk:  Strength and tone appear normal for age. Extremities: No clubbing or cyanosis.  Bilateral BKA  Skin: Warm and Dry Neuro: Alert and oriented X 3. CN  III-XII intact Grossly normal sensory and motor function . Psych:  Responds to  questions appropriately with a normal affect.      Labs: Cardiac Enzymes No results for input(s): CKTOTAL, CKMB, TROPONINI in the last 72 hours. CBC Lab Results  Component Value Date   WBC 7.5 10/15/2016   HGB 12.5 (L) 10/15/2016   HCT 38.5 (L) 10/15/2016   MCV 74.9 (L) 10/15/2016   PLT 162 10/15/2016   PROTIME:  Recent Labs  10/13/16 1343 10/14/16 0402 10/15/16 0438  LABPROT 35.8* 36.7* 21.8*  INR 3.48 3.59 1.87   Chemistry  Recent Labs Lab 10/14/16 0402 10/15/16 0438  NA 141 140  K 3.5 3.5  CL 108 101  CO2 25 25  BUN 27* 22*  CREATININE 1.47* 1.47*  CALCIUM 8.4* 8.7*  PROT 5.7*  --   BILITOT 0.5  --   ALKPHOS 58  --   ALT 39  --   AST 17  --   GLUCOSE 328* 240*   Lipids Lab Results  Component Value Date   CHOL 169 01/10/2011   HDL 38.50 (L) 01/10/2011   LDLCALC 98 01/10/2011   TRIG 165.0 (H) 01/10/2011   BNP No results found for: PROBNP Thyroid Function Tests: No results for input(s): TSH, T4TOTAL, T3FREE, THYROIDAB in the last 72 hours.  Invalid input(s): FREET3    Miscellaneous No results found for: DDIMER  Radiology/Studies:  Ct Abdomen Pelvis Wo Contrast  Result Date: 10/13/2016 CLINICAL DATA:  Abdominal pain EXAM: CT ABDOMEN AND PELVIS WITHOUT CONTRAST TECHNIQUE: Multidetector CT imaging of the abdomen and pelvis was performed following the standard protocol without IV contrast. COMPARISON:  02/13/2013 FINDINGS: Lower chest: There is small bilateral pleural effusion. Bilateral lower lobe posterior atelectasis or infiltrate. Partially visualized cardiac pacemaker leads. Cardiomegaly is noted. Hepatobiliary: Unenhanced liver the shows no biliary ductal dilatation. No focal hepatic abnormality. Again noted layering gallbladder sludge or tiny calcified gallstones within gallbladder fundal region. Pancreas: Unenhanced pancreas is atrophic fatty replaced. Stable 1.3 cm  hyperdense nodule in the region of pancreatic tail probable accessory splenule. Spleen: Stable small accessory splenules. Unenhanced spleen without focal abnormality. Adrenals/Urinary Tract: No adrenal gland mass. Unenhanced kidneys shows no nephrolithiasis. No hydronephrosis or hydroureter. No calcified ureteral calculi are noted bilaterally. There is moderate distended urinary bladder. No urinary bladder filling defects. Limited evaluation of the pelvis due to metallic artifacts from left hip prosthesis. Nonspecific mild bilateral perinephric stranding. Stomach/Bowel: No small bowel obstruction. No gastric outlet obstruction. No pericecal inflammation. Normal appendix partially visualized in axial image 53. Some colonic stool noted in right colon. Some colonic stool and gas noted within transverse colon and descending colon without significant colonic distension. Few diverticula are noted descending colon proximal sigmoid colon. No definite evidence of acute colitis or diverticulitis. Some colonic gas noted proximal sigmoid colon. No distal colonic obstruction. Vascular/Lymphatic: Atherosclerotic calcifications of abdominal aorta and iliac arteries. No aortic aneurysm. Reproductive: No pelvic masses noted. Prostate gland measures 4.3 x 2.9 cm within normal limits. Other: No ascites or free abdominal air. Mild anasarca infiltration of subcutaneous fat bilateral flank wall and anterior abdominal wall. There is a umbilical hernia containing omental fat best seen in sagittal image 93 measures 2 cm without evidence of acute complication. Mild skin thickening and subcutaneous stranding in lower anterior abdominal wall axial image 56 probable mild dermatitis or subcutaneous edema. Musculoskeletal: No destructive bony lesions are noted. There is disc space flattening with mild vacuum disc phenomenon and mild posterior disc bulge at L3-L4 level. There are metallic artifacts from left hip prosthesis. IMPRESSION: 1. Small  bilateral pleural effusion. Bilateral lower lobe posterior atelectasis or infiltrate. 2. No nephrolithiasis. No hydronephrosis or hydroureter. Nonspecific mild bilateral perinephric stranding. No calcified ureteral calculi. 3. There is moderate distended urinary bladder without urinary bladder filling defects. No calcified calculi. Please note the urinary bladder measures at least 17 cm in length cranial caudally. 4. Normal appendix.  No pericecal inflammation. 5. No small bowel or colonic obstruction. Few diverticula are noted descending colon proximal sigmoid colon. No definite evidence of acute colitis or diverticulitis. 6. Limited evaluation of the pelvis due to metallic artifacts from left hip prosthesis. 7. There is disc space flattening with mild vacuum disc phenomenon and mild posterior disc bulge at L2-L3 level. 8. Mild anasarca infiltration of subcutaneous fat abdominal and pelvic wall. 9. There is a umbilical hernia containing omental fat best seen in sagittal image 93 measures 2 cm without evidence of acute complication. Mild skin thickening and subcutaneous stranding in lower anterior abdominal wall axial image 56 probable mild dermatitis or subcutaneous edema. Electronically Signed   By: Lahoma Crocker M.D.   On: 10/13/2016 16:57   Dg Chest 2 View  Result Date: 10/13/2016 CLINICAL DATA:  Dizziness. Chest pain. Shortness of breath. Cough. nausea. EXAM: CHEST  2 VIEW COMPARISON:  05/02/2016 FINDINGS: Chronic mild cardiomegaly. Pacemaker/AICD leads in the same approximate position. Re- demonstration of congestive heart failure with venous hypertension, mild interstitial edema and small effusions with dependent atelectasis. No acute bone finding. IMPRESSION: Re- demonstration of heart failure with interstitial edema and effusions. Electronically Signed   By: Nelson Chimes M.D.   On: 10/13/2016 12:31    EKG: V pacing Device interrogation demonstrates that he is mostly P-synchronous/ AV  pacing    He has  100% Vpaced   Assessment and Plan:  Ventricular tachycardia-recurrent  Nonischemic cardiomyopathy  Acute on chronic congestive heart failure  Implantable defibrillator-[employer]   The device was reprogrammed to treat ventricular tachycardia faster than 1 30 bpm. There are no shocks in this zone. Max track rate was decreased to 130--110 bpm.  I am concerned about his sleepiness and abdominal pain   Virl Axe

## 2016-10-15 NOTE — Consult Note (Addendum)
Advanced Heart Failure Team Consult Note  Referring Physician: Dr. Broadus John Primary Physician: Dr. Inda Merlin Primary Cardiologist: Dr. Caryl Comes   Reason for Consultation: Acute on chronic systolic CHF.   HPI:    [name] [name] is a 71 year old male with a past medical history of NICM w/ CRT-D, chronic systolic CHF 0000000, DM2, PAF on Coumadin, and bilateral BKA.He had a left heart catheterization in Feb. 2003, only had minimal disease in his first diagonal.  He presented to the ED on 10/13/16 with a 2 week history of left sided chest pain,  SOB, and abdominal pain. Pertinent labs on admission- BNP 1039, chest X ray consistent with CHF pattern, hemoglobin 12.6, creatinine 1.60. Abdominal CT shows umbilical hernia without acute complications.   He noted increasing SOB at rest especially over the past few days.  2 pillow orthopnea and occasional PND, but more PND in the last 3 nights. His main complaint is a abdominal pain currently, he does have an umbilical hernia without evidence of acute complication.   His Echo this admission shows an EF of 20-25%, diffuse hypokinesis and grade 2 diastolic dysfunction. This is unchanged from his Echo in 2014. He was started on Entresto in March of 2017 per Dr. Caryl Comes. This was put on hold this admission.   He was seen by Dr. Olin Pia PA on 09/26/16, noted one episode of NSVT, with rate below VT zone so no shocks. The device was reprogrammed to include  VT1 detection 160 w/ATP therapies only, VT2 to 180 and VF to 220 no changes to therapies were made to these zones.  He can walk with the help of his prothesis and a walker. Normally, he can walk throughout the house and at the grocery store with minimal SOB. Recently, has not been able to walk throughout his house without dyspnea. Feels more fatigued.   He has been eating sausage and spam sandwiches. His wife manages his medications and he says that he rarely misses doses.   Review of Systems: [y] = yes, [ ]  = no    General: Weight gain Blue.Reese ]; Weight loss [ ] ; Anorexia [ ] ; Fatigue [ y]; Fever [n ]; Chills [ ] ; Weakness [ ]   Cardiac: Chest pain/pressure [n ]; Resting SOB Blue.Reese ]; Exertional SOB Blue.Reese ]; Orthopnea [ ] ; Pedal Edema [ ] ; Palpitations [ ] ; Syncope [ ] ; Presyncope [ ] ; Paroxysmal nocturnal dyspnea[ ]   Pulmonary: Cough [ ] ; Wheezing[ ] ; Hemoptysis[ ] ; Sputum [ ] ; Snoring [ ]   GI: Vomiting[ n]; Dysphagia[ ] ; Melena[ ] ; Hematochezia [ ] ; Heartburn[ ] ; Abdominal pain [ y]; Constipation [ ] ; Diarrhea [ ] ; BRBPR [ ]   GU: Hematuria[ ] ; Dysuria [ ] ; Nocturia[ ]   Vascular: Pain in legs with walking [ ] ; Pain in feet with lying flat [ ] ; Non-healing sores [ ] ; Stroke [ ] ; TIA [ ] ; Slurred speech [ ] ;  Neuro: Headaches[ n]; Vertigo[ ] ; Seizures[ ] ; Paresthesias[ ] ;Blurred vision [ ] ; Diplopia [ ] ; Vision changes [ ]   Ortho/Skin: Arthritis [ ] ; Joint pain [ ] ; Muscle pain [ ] ; Joint swelling [ ] ; Back Pain [ ] ; Rash [ ]   Psych: Depression[ ] ; Anxiety[ ]   Heme: Bleeding problems [ ] ; Clotting disorders [ ] ; Anemia [ ]   Endocrine: Diabetes [ ] ; Thyroid dysfunction[ ]   Home Medications Prior to Admission medications   Medication Sig Start Date End Date Taking? Authorizing Provider  acetaminophen (TYLENOL) 325 MG tablet Take 2 tablets (650 mg total) by mouth every 6 (  six) hours as needed for mild pain (or Fever >/= 101). 05/03/16  Yes Maryann Mikhail, DO  aspirin EC 81 MG tablet Take 81 mg by mouth daily.   Yes Historical Provider, MD  atorvastatin (LIPITOR) 10 MG tablet Take 1 tablet (10 mg total) by mouth daily. 01/15/15  Yes Daniel J Angiulli, PA-C  carvedilol (COREG) 12.5 MG tablet Take 1 tablet (12.5 mg total) by mouth 2 (two) times daily with a meal. 01/15/15  Yes Daniel J Angiulli, PA-C  Cholecalciferol (VITAMIN D3) 10000 units TABS Take 1 tablet by mouth daily.   Yes Historical Provider, MD  furosemide (LASIX) 40 MG tablet Take 1 tablet (40 mg total) by mouth daily. Patient taking differently: Take 40-80 mg by  mouth daily. Monday,wednesday,friday, Saturday ( 80 mg 4  Days a week ) all other days 40 mg 01/02/16  Yes Theodis Blaze, MD  insulin detemir (LEVEMIR) 100 UNIT/ML injection 25 unit AM and 35 unit PM Patient taking differently: Inject 25-35 Units into the skin See admin instructions. 25 unit AM and 35 unit PM 01/02/16  Yes Theodis Blaze, MD  iron polysaccharides (NIFEREX) 150 MG capsule Take 1 capsule (150 mg total) by mouth daily. 01/15/15  Yes Daniel J Angiulli, PA-C  Multiple Vitamin (MULTIVITAMIN WITH MINERALS) TABS tablet Take 1 tablet by mouth daily.   Yes Historical Provider, MD  omeprazole (PRILOSEC) 20 MG capsule Take 20 mg by mouth daily.   Yes Historical Provider, MD  pregabalin (LYRICA) 75 MG capsule Take 1 capsule (75 mg total) by mouth 2 (two) times daily. 01/15/15  Yes Daniel J Angiulli, PA-C  sacubitril-valsartan (ENTRESTO) 24-26 MG Take 1 tablet by mouth 2 (two) times daily. 10/31/15  Yes Deboraha Sprang, MD  senna-docusate (SENOKOT-S) 8.6-50 MG per tablet Take 1 tablet by mouth 2 (two) times daily. 11/28/13  Yes Ivan Anchors Love, PA-C  warfarin (COUMADIN) 10 MG tablet Take 10-15 mg by mouth daily. Wednesday, Saturday 15 mg and all other days are 10 mg   Yes Historical Provider, MD  warfarin (COUMADIN) 10 MG tablet TAKE AS DIRECTED BY COUMADIN CLINIC Patient not taking: Reported on 10/13/2016 07/24/16   Deboraha Sprang, MD    Past Medical History: Past Medical History:  Diagnosis Date  . Anemia    takes Iron pill daily  . Arthritis   . Automatic implantable cardioverter-defibrillator in situ    a. 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  . CAD (coronary artery disease)   . Cardiomyopathy, nonischemic ([place])    a. Cath 2003: mild nonobstructive CAD, EF 25% at that time.  . Chronic systolic CHF (congestive heart failure) (HCC)    takes Furosemide daily as well as Aldactone  . CKD (chronic kidney disease), stage III   . Complication of anesthesia   .  Constipation    takes Sennokot daily  . Dementia   . GERD (gastroesophageal reflux disease)    takes Nexium daily  . Headache    occasionally  . High cholesterol    takes Atorvastatin daily  . History of blood transfusion    no abnormal reaction noted  . History of bronchitis    > 1 yr ago  . History of kidney stones   . Hypertension    takes Coreg daily  . LBBB (left bundle branch block)    takes Coumadin daily  . NSVT (nonsustained ventricular tachycardia) ([place])    a. Noted on ICD interrogation in 2011.  [name] PAD (peripheral  artery disease) ([place])    a. 08/2013 Periph Angio/PTA: Abd Ao nl, RLE- 3v runoff, PT diff dzs, AT 90p, LLE 2v runoff, PT 100, AT 27m (diamondback ORA/chocolate balloon PTA).  [name] PAF (paroxysmal atrial fibrillation) ([place])    a. Noted on ICD interrogation 2012;  b. coumadin d/c'd 01/2013.  [name] PAF (paroxysmal atrial fibrillation) ([place])   . Peripheral neuropathy (HCC)    hands;numbness and tingling   . Pneumonia    hx of > 34yr ago  . PONV (postoperative nausea and vomiting)   . Type II diabetes mellitus ([place])    takes Levemir daily as well as Novolog  . Urinary frequency   . Urinary urgency     Past Surgical History: Past Surgical History:  Procedure Laterality Date  . ABDOMINAL ANGIOGRAM  09/12/2013   Procedure: ABDOMINAL ANGIOGRAM;  Surgeon: Lorretta Harp, MD;  Location[facility] CATH LAB;  Service: Cardiovascular;;  . AMPUTATION Left 10/04/2013   Procedure: LEFT GREAT TOE AND SECOND TOE AMPUTATION;  Surgeon: Marianna Payment, MD;  Location: [place];  Service: Orthopedics;  Laterality: Left;  . AMPUTATION Left 11/09/2013   Procedure: LEFT AMPUTATION BELOW KNEE;  Surgeon: Marianna Payment, MD;  Location: [place];  Service: Orthopedics;  Laterality: Left;  . AMPUTATION Right 01/04/2014   Procedure: Doristine Devoid second and third toe amputation;  Surgeon: Marianna Payment, MD;  Location: [place];  Service: Orthopedics;  Laterality: Right;  . AMPUTATION Right 01/30/2014    Procedure: RIGHT BELOW KNEE AMPUTATION;  Surgeon: Marianna Payment, MD;  Location: [place];  Service: Orthopedics;  Laterality: Right;  . ATHERECTOMY Right 12/29/2013   Procedure: ATHERECTOMY;  Surgeon: Lorretta Harp, MD;  Location: [facility] CATH LAB;  Service: Cardiovascular;  Laterality: Right;  Anterior Tibial Artery  . CARDIAC CATHETERIZATION  10/01/2001   THERE WAS GLOBAL HYPOKINESIS AND EF 25%. THERE APPEARED TO BE GLOBAL DECREASE IN WALL MOTION  . CARDIAC DEFIBRILLATOR PLACEMENT  06/2009   WITH GENERATOR REPLACED; BiV ICD  . CARDIOVASCULAR STRESS TEST  03/20/2009   EF 33%  . [place]  . COLONOSCOPY    . ESOPHAGOGASTRODUODENOSCOPY    . HIP ARTHROPLASTY Left 12/28/2015   Procedure: POSTERIOR  APPROACH HEMI HIP ARTHROPLASTY;  Surgeon: Leandrew Koyanagi, MD;  Location: [place];  Service: Orthopedics;  Laterality: Left;  . LEG AMPUTATION BELOW KNEE Left 11/09/2013   DR Erlinda Hong  . LITHOTRIPSY  2001  . LOWER EXTREMITY ANGIOGRAM Bilateral 09/12/2013   Procedure: LOWER EXTREMITY ANGIOGRAM;  Surgeon: Lorretta Harp, MD;  Location: [facility] CATH LAB;  Service: Cardiovascular;  Laterality: Bilateral;  . LOWER EXTREMITY ANGIOGRAM N/A 12/29/2013   Procedure: LOWER EXTREMITY ANGIOGRAM;  Surgeon: Lorretta Harp, MD;  [facility] C[facility] LAB;  Service: Cardiovascular;  Laterality: N/A;  . STUMP REVISION Left 01/03/2015   Procedure: LEFT BELOW KNEE AMPUTATION REVISION;  Surgeon: Leandrew Koyanagi, MD;  Location: [place];  Service: Orthopedics;  Laterality: Left;  . TOE AMPUTATION  10/04/2013   LEFT GREAT TOE AND 4TH TOE   /   DR Erlinda Hong  . TRANSLUMINAL ATHERECTOMY TIBIAL ARTERY Left 09/12/2013  . US ECHOCARDIOGRAPHY  03/21/2008   EF 30-35%    Family History: Family History  Problem Relation Age of Onset  . Heart disease Mother   . Hypertension Mother   . Diabetes Mother   . Diabetes Father     Social History: Social History   Social History  . Marital status: Married  Spouse name: N/A  . Number of  children: N/A  . Years of education: N/A   Social History Main Topics  . Smoking status: Never Smoker  . Smokeless tobacco: Never Used  . Alcohol use No  . Drug use: No  . Sexual activity: Not Currently   Other Topics Concern  . None   Social History Narrative  . None    Allergies:  No Known Allergies  Objective:    Vital Signs:   Temp:  [98.2 F (36.8 C)-99.4 F (37.4 C)] 99.4 F (37.4 C) (02/21 0428) Pulse Rate:  [77-89] 89 (02/21 0428) Resp:  [20] 20 (02/21 0428) BP: (112-140)/(59-95) 140/71 (02/21 0428) SpO2:  [93 %-97 %] 93 % (02/21 0428) Weight:  [204 lb 14.4 oz (92.9 kg)] 204 lb 14.4 oz (92.9 kg) (02/21 0428) Last BM Date: 10/13/16  Weight change: Filed Weights   10/13/16 1732 10/14/16 0501 10/15/16 0428  Weight: 208 lb 6.4 oz (94.5 kg) 206 lb 4.8 oz (93.6 kg) 204 lb 14.4 oz (92.9 kg)    Intake/Output:   Intake/Output Summary (Last 24 hours) at 10/15/16 0915 Last data filed at 10/15/16 0818  Gross per 24 hour  Intake              900 ml  Output             3625 ml  Net            -2725 ml     Physical Exam: General:  ill appearing male  No resp difficulty HEENT: Poor dentition.  Neck: supple. JVP 6-7 cm . Carotids 2+ bilat; no bruits. No lymphadenopathy or thyromegaly appreciated. Cor: PMI nondisplaced. Regular rate & rhythm. No rubs, gallops or murmurs. Lungs: clear Abdomen: soft, tender, distended. No hepatosplenomegaly. No bruits or masses. Good bowel sounds. Extremities: no cyanosis, clubbing, rash, edema. Bilateral BKA.  Neuro: alert & orientedx3, cranial nerves grossly intact. moves all 4 extremities w/o difficulty. Affect pleasant  Telemetry: V paced. Rates in the 90's.   Labs: Basic Metabolic Panel:  Recent Labs Lab 10/13/16 1213 10/13/16 1239 10/13/16 1749 10/14/16 0402 10/15/16 0438  NA 144 145  --  141 140  K 4.0 4.1  --  3.5 3.5  CL 113* 111  --  108 101  CO2 21*  --   --  25 25  GLUCOSE 135* 134*  --  328* 240*  BUN 29*  32*  --  27* 22*  CREATININE 1.60* 1.60*  --  1.47* 1.47*  CALCIUM 8.6*  --   --  8.4* 8.7*  MG  --   --  1.9  --   --     Liver Function Tests:  Recent Labs Lab 10/13/16 1213 10/14/16 0402  AST 26 17  ALT 53 39  ALKPHOS 71 58  BILITOT 0.4 0.5  PROT 6.6 5.7*  ALBUMIN 3.4* 3.0*    CBC:  Recent Labs Lab 10/13/16 1213 10/13/16 1239 10/15/16 0438  WBC 4.4  --  7.5  NEUTROABS 3.4  --   --   HGB 11.3* 12.6* 12.5*  HCT 35.0* 37.0* 38.5*  MCV 74.9*  --  74.9*  PLT 155  --  162     BNP: BNP (last 3 results)  Recent Labs  12/28/15 0550 10/13/16 1401  BNP 380.3* 1,039.0*     CBG:  Recent Labs Lab 10/14/16 0737 10/14/16 1151 10/14/16 1717 10/14/16 2136 10/15/16 0750  GLUCAP 249* 215* 177* 197* 243*  Coagulation Studies:  Recent Labs  10/13/16 1343 10/14/16 0402 10/15/16 0438  LABPROT 35.8* 36.7* 21.8*  INR 3.48 3.59 1.87     Medications:     Current Medications: . aspirin EC  81 mg Oral Daily  . atorvastatin  10 mg Oral Daily  . carvedilol  12.5 mg Oral BID WC  . furosemide  80 mg Intravenous BID  . insulin aspart  0-9 Units Subcutaneous TID WC  . insulin detemir  25 Units Subcutaneous BID  . polyethylene glycol  17 g Oral Daily  . pregabalin  75 mg Oral BID  . senna-docusate  1 tablet Oral BID  . warfarin  15 mg Oral ONCE-1800  . Warfarin - Pharmacist Dosing Inpatient   Does not apply q1800      Assessment/Plan   1. Acute on chronic combined diastolic and systolic CHF, NYHA class III: Appears somewhat volume overloaded on exam. Has gotten 80mg  BID Lasix for 2 days, weight down 4 pounds from admission. Good diuresis, -5.8L so far.  - Continue IV diuresis today, can likely transition to po tomorrow.  - Start Arlyce Harman 12.5mg  daily.  - May restart Entresto 24/26mg  BID, BP is on the high side and creatinine stable.  - Will consult heart failure navigator for education.  - Daily weights and strict I&O.  - Continue Coreg  2. PAF, s/p  CRT-D: Follows with Dr. Caryl Comes, last seen earlier this month.  He is in NSR today.  - Device was reprogrammed to lower threshold on VT rate for therapy.  - This patients CHA2DS2-VASc Score and unadjusted Ischemic Stroke Rate (% per year) is equal to 7.2 % stroke rate/year from a score of 5 Above score calculated as 1 point each if present [CHF, HTN, DM, Vascular=MI/PAD/Aortic Plaque, Age if 65-74, or Male], 2 points each if present [Age > 75, or Stroke/TIA/TE] - Continue warfarin.  3. Abdominal pain: Continues to have abdominal pain. No leukocytosis, abdominal CT without acute process.  4. CKD stage III:  - baseline 1.40-1.42. Stable.  5. Obesity - BMI 30.47. Will consult heart failure navigator.   Length of Stay: [place], NP  10/15/2016, 9:15 AM  Advanced Heart Failure Team Pager [phone] (M-F; [place])  Please contact [facility] for night-coverage after hours (4p -7a ) and weekends on [email or web address]  Patient seen with NP, agree with the above note.  At this point, he has more prominent nausea/vomiting and abdominal discomfort than anything else.  He had diarrhea prior to admission, none since in the hospital.  Abdominal CT was really unremarkable.  Has an umbilical hernia containing only omentum, no bowel obstruction.  Needs further GI workup.   In terms of CHF, he is not markedly volume overloaded to my exam.  Weight is down with IV Lasix.  Would give 1 more day of IV Lasix then to po.  Restart home Entresto and would use spironolactone 12.5 mg daily.  Follow BMET.   Loralie Champagne 10/15/2016 1:10 PM

## 2016-10-15 NOTE — Progress Notes (Signed)
Pt without BM for last 48 hours. No C. Diff specimen able to be obtained. Will continue to monitor.

## 2016-10-15 NOTE — Progress Notes (Signed)
Pts wife requesting to speak with MD.  MD notified.

## 2016-10-15 NOTE — Progress Notes (Signed)
[place] for warfarin Indication: atrial fibrillation  Assessment: 65 yom with history of PAF on warfarin PTA. Pharmacy consulted to dose inpatient. INR has dropped to subtherapeutic level of 1.87 after holding two doses. Unclear cause of significant drop as we have not actively attempted to correct previous supratherapeutic levels.  PTA warfarin dose: 10mg  daily except 15mg  on Wed/Sat (last dose 2/18)  Goal of Therapy:  INR 2-3 Monitor platelets by anticoagulation protocol: Yes   Plan:  Warfarin 15mg  tonight x1 Daily INR Monitor CBC, s/sx bleeding   Andrey Cota. Diona Foley, PharmD, BCPS Clinical Pharmacist [phone] 10/15/2016 8:35 AM

## 2016-10-15 NOTE — Progress Notes (Signed)
Inpatient Diabetes Program Recommendations  AACE/ADA: New Consensus Statement on Inpatient Glycemic Control (2015)  Target Ranges:  Prepandial:   less than 140 mg/dL      Peak postprandial:   less than 180 mg/dL (1-2 hours)      Critically ill patients:  140 - 180 mg/dL   Results for [name] [name], [name] [name] (MRN [id number]) as of 10/15/2016 12:28  Ref. Range 10/14/2016 11:51 10/14/2016 17:17 10/14/2016 21:36 10/15/2016 07:50 10/15/2016 12:04  Glucose-Capillary Latest Ref Range: 65 - 99 mg/dL 215 (H) 177 (H) 197 (H) 243 (H) 230 (H)   Review of Glycemic Control  Diabetes history: DM 2 Outpatient Diabetes medications: Levemir 25 units QAM, 35 units QPM Current orders for Inpatient glycemic control: Levemir 25 units BID, Novolog Sensitive TID  Inpatient Diabetes Program Recommendations:   Fasting glucose elevated this am. Patient takes higher dose of Levemir in the evening times at home. Please consider increasing evening dose of Levemir to 30 units QPM. AM dose remaining the same.  Thanks,  Tama Headings RN, MSN, [facility] Inpatient Diabetes Coordinator Team Pager [phone] (8a-5p)

## 2016-10-15 NOTE — Progress Notes (Addendum)
Patient ID: [name] [name], male   DOB: 11-Jun-1946, 71 y.o.   MRN: [id number]    PROGRESS NOTE  VERNE [name]  [id number] DOB: Nov 16, 1945 DOA: 10/13/2016  PCP: [name], MD   Brief Narrative:  71 y.o. male with medical history significant of NICM status post ICD, CHF with EF to 20-25% in 2014, DM-2, CKD, PAF on coumadin, bilateral BKA who presented to the ED with c/o left sided throbbing chest pain and SOB during that last two weeks. Patient has been seen by his cardiologist and Lasix was increased from 40 to 80 mg twice a day, but despite this measure he has worsening orthopnea and during the last two weeks sleeps on two pillows instead of one as before.  Assessment & Plan:  Acute on chronic exacerbation of systolic CHF - pt currently on lasix 80 mg IV BID - diuresing well, 5.8L negative - ECHo with EF of 20-25% and grade 2 diastolic dysfunction - will ask CHF team for input, he is on Entresto at home -continue Coreg  Abd pain -due to umb hernia/ ? 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  B BKA - ambulates with prosthesis  DVT prophylaxis: on Coumadin  Code Status: Full  Family Communication:none at bedside Disposition Plan:  Home when clinically improved, euvolemic  Consultants:   CHF team  Procedures:   None  Antimicrobials:   None  Subjective: Breathing better, has abd discomfort  Objective: Vitals:   10/15/16 0021 10/15/16 0428 10/15/16 1012 10/15/16 1237  BP: 138/75 140/71 136/71 135/74  Pulse: 82 89 92 92  Resp: 20 20  18   Temp: 98.6 F (37 C) 99.4 F (37.4 C)  99.1 F (37.3 C)  TempSrc: Oral Oral  Oral  SpO2: 96% 93%  94%  Weight:  92.9 kg (204 lb 14.4 oz)    Height:        Intake/Output Summary (Last 24 hours) at 10/15/16 1246 Last data filed at 10/15/16 1120  Gross per 24 hour  Intake              900 ml  Output             3550 ml  Net            -2650 ml   Filed Weights   10/13/16 1732 10/14/16 0501 10/15/16 0428  Weight: 94.5 kg (208 lb 6.4 oz) 93.6 kg (206 lb 4.8 oz) 92.9 kg (204 lb 14.4 oz)   Examination:  General exam: Appears calm and comfortable  Respiratory system: diminished breath sounds at bases with crackles  Cardiovascular system: S1 & S2 heard, RRR. B BKA Gastrointestinal system: Abdomen is distended, soft and nontender, umbilical hernia noted Central nervous system: Alert and oriented. No focal  neurological deficits. Psychiatry: Judgement and insight appear normal. Mood & affect appropriate.  Ext: B/o BKA  Data Reviewed: I have personally reviewed following labs and imaging studies  CBC:  Recent Labs Lab 10/13/16 1213 10/13/16 1239 10/15/16 0438  WBC 4.4  --  7.5  NEUTROABS 3.4  --   --   HGB 11.3* 12.6* 12.5*  HCT 35.0* 37.0* 38.5*  MCV 74.9*  --  74.9*  PLT 155  --  0000000   Basic Metabolic Panel:  Recent Labs Lab 10/13/16 1213 10/13/16 1239 10/13/16 1749 10/14/16 0402 10/15/16 0438  NA 144 145  --  141 140  K 4.0 4.1  --  3.5 3.5  CL 113* 111  --  108 101  CO2 21*  --   --  25 25  GLUCOSE 135* 134*  --  328* 240*  BUN 29* 32*  --  27* 22*  CREATININE 1.60* 1.60*  --  1.47* 1.47*  CALCIUM 8.6*  --   --  8.4* 8.7*  MG  --   --   1.9  --   --    \Liver Function Tests:  Recent Labs Lab 10/13/16 1213 10/14/16 0402  AST 26 17  ALT 53 39  ALKPHOS 71 58  BILITOT 0.4 0.5  PROT 6.6 5.7*  ALBUMIN 3.4* 3.0*   Coagulation Profile:  Recent Labs Lab 10/13/16 1343 10/14/16 0402 10/15/16 0438  INR 3.48 3.59 1.87   CBG:  Recent Labs Lab 10/14/16 1151 10/14/16 1717 10/14/16 2136 10/15/16 0750 10/15/16 1204  GLUCAP 215* 177* 197* 243* 230*   Urine analysis:    Component Value Date/Time   COLORURINE STRAW (A) 10/13/2016 1428   APPEARANCEUR CLEAR 10/13/2016 1428   LABSPEC 1.010 10/13/2016 1428   PHURINE 5.0 10/13/2016 1428   GLUCOSEU 50 (A) 10/13/2016 1428   HGBUR MODERATE (A) 10/13/2016 1428   BILIRUBINUR NEGATIVE 10/13/2016 1428   KETONESUR NEGATIVE 10/13/2016 1428   PROTEINUR 30 (A) 10/13/2016 1428   UROBILINOGEN 0.2 01/11/2015 1850   NITRITE NEGATIVE 10/13/2016 1428   LEUKOCYTESUR NEGATIVE 10/13/2016 1428   Radiology Studies: Ct Abdomen Pelvis Wo Contrast Result Date: 10/13/2016 1. Small bilateral pleural effusion. Bilateral lower lobe posterior atelectasis or infiltrate.  2. No nephrolithiasis. No hydronephrosis or hydroureter. Nonspecific mild bilateral perinephric stranding. No calcified ureteral calculi.  3. There is moderate distended urinary bladder without urinary bladder filling defects. No calcified calculi. Please note the urinary bladder measures at least 17 cm in length cranial caudally.  4. Normal appendix.  No pericecal inflammation.  5. No small bowel or colonic obstruction. Few diverticula are noted descending colon proximal sigmoid colon. No definite evidence of acute colitis or diverticulitis.  6. Limited evaluation of the pelvis due to metallic artifacts from left hip prosthesis.  7. There is disc space flattening with mild vacuum disc phenomenon and mild posterior disc bulge at L2-L3 level.  8. Mild anasarca infiltration of subcutaneous fat abdominal and pelvic wall.  9. There  is a umbilical hernia containing omental fat best seen in sagittal image 93 measures 2 cm without evidence of acute complication. Mild skin thickening and subcutaneous stranding in lower anterior abdominal wall axial image 56 probable mild dermatitis or subcutaneous edema.   Dg Chest 2 View Result Date: 10/13/2016 Re- demonstration of heart failure with interstitial edema and effusions.   Scheduled Meds: . aspirin EC  81 mg Oral Daily  . atorvastatin  10 mg Oral Daily  . carvedilol  12.5 mg Oral BID  WC  . furosemide  80 mg Intravenous BID  . insulin aspart  0-9 Units Subcutaneous TID WC  . insulin detemir  25 Units Subcutaneous BID  . polyethylene glycol  17 g Oral Daily  . pregabalin  75 mg Oral BID  . senna-docusate  1 tablet Oral BID  . warfarin  15 mg Oral ONCE-1800  . Warfarin - Pharmacist Dosing Inpatient   Does not apply q1800   Continuous Infusions:   LOS: 2 days   Time spent:30 minutes   Domenic Polite, MD [facility] Pager [phone]  If 7PM-7AM, please contact night-coverage [email or web address] Password [facility] 10/15/2016, 12:46 PM

## 2016-10-15 NOTE — Progress Notes (Signed)
Pt vomited a small amount, and c/o abdominal pain.  Pt given zofran. Will continue to monitor.

## 2016-10-16 ENCOUNTER — Inpatient Hospital Stay (HOSPITAL_COMMUNITY): Payer: Medicare Other

## 2016-10-16 LAB — GLUCOSE, CAPILLARY
Glucose-Capillary: 241 mg/dL — ABNORMAL HIGH (ref 65–99)
Glucose-Capillary: 247 mg/dL — ABNORMAL HIGH (ref 65–99)
Glucose-Capillary: 228 mg/dL — ABNORMAL HIGH (ref 65–99)
Glucose-Capillary: 290 mg/dL — ABNORMAL HIGH (ref 65–99)

## 2016-10-16 LAB — BASIC METABOLIC PANEL
ANION GAP: 12 (ref 5–15)
BUN: 24 mg/dL — ABNORMAL HIGH (ref 6–20)
CHLORIDE: 102 mmol/L (ref 101–111)
CO2: 30 mmol/L (ref 22–32)
Calcium: 9.1 mg/dL (ref 8.9–10.3)
Creatinine, Ser: 1.69 mg/dL — ABNORMAL HIGH (ref 0.61–1.24)
GFR calc Af Amer: 45 mL/min — ABNORMAL LOW (ref >60)
GFR, EST NON AFRICAN AMERICAN: 38 mL/min — AB (ref >60)
Glucose, Bld: 216 mg/dL — ABNORMAL HIGH (ref 65–99)
POTASSIUM: 3.5 mmol/L (ref 3.5–5.1)
SODIUM: 144 mmol/L (ref 135–145)

## 2016-10-16 LAB — CBC
HCT: 42.1 % (ref 39.0–52.0)
HEMOGLOBIN: 14 g/dL (ref 13.0–17.0)
MCH: 25 pg — AB (ref 26.0–34.0)
MCHC: 33.3 g/dL (ref 30.0–36.0)
MCV: 75.3 fL — ABNORMAL LOW (ref 78.0–100.0)
PLATELETS: 160 10*3/uL (ref 150–400)
RBC: 5.59 MIL/uL (ref 4.22–5.81)
RDW: 16.7 % — ABNORMAL HIGH (ref 11.5–15.5)
WBC: 17.1 10*3/uL — ABNORMAL HIGH (ref 4.0–10.5)

## 2016-10-16 LAB — PROTIME-INR
INR: 1.6
PROTHROMBIN TIME: 19.2 s — AB (ref 11.4–15.2)

## 2016-10-16 LAB — HEMOGLOBIN A1C
Hgb A1c MFr Bld: 11 % — ABNORMAL HIGH (ref 4.8–5.6)
Mean Plasma Glucose: 269

## 2016-10-16 MED ORDER — SODIUM CHLORIDE 0.9 % IV SOLN
30.0000 meq | Freq: Once | INTRAVENOUS | Status: AC
  Administered 2016-10-16: 30 meq via INTRAVENOUS
  Filled 2016-10-16: qty 15

## 2016-10-16 MED ORDER — BISACODYL 10 MG RE SUPP
10.0000 mg | Freq: Once | RECTAL | Status: AC
  Administered 2016-10-16: 10 mg via RECTAL
  Filled 2016-10-16: qty 1

## 2016-10-16 MED ORDER — WARFARIN SODIUM 7.5 MG PO TABS
15.0000 mg | ORAL_TABLET | Freq: Once | ORAL | Status: AC
  Administered 2016-10-16: 15 mg via ORAL
  Filled 2016-10-16: qty 2

## 2016-10-16 MED ORDER — PREGABALIN 50 MG PO CAPS
75.0000 mg | ORAL_CAPSULE | Freq: Every day | ORAL | Status: DC
  Administered 2016-10-16 – 2016-10-22 (×6): 75 mg via ORAL
  Filled 2016-10-16 (×6): qty 1

## 2016-10-16 NOTE — Progress Notes (Signed)
ANTICOAGULATION CONSULT NOTE - Follow Up Consult  Pharmacy Consult for warfarin Indication: atrial fibrillation  No Known Allergies  Patient Measurements: Height: 5\' 9"  (175.3 cm) Weight: 194 lb 9.6 oz (88.3 kg) (scale b) IBW/kg (Calculated) : 70.7  Vital Signs: Temp: 99.4 F (37.4 C) (02/22 0829) Temp Source: Oral (02/22 0829) BP: 135/75 (02/22 0829) Pulse Rate: 99 (02/22 0829)  Labs:  Recent Labs  10/13/16 1213 10/13/16 1239  10/14/16 0402 10/15/16 0438 10/16/16 0413 10/16/16 0814  HGB 11.3* 12.6*  --   --  12.5*  --  14.0  HCT 35.0* 37.0*  --   --  38.5*  --  42.1  PLT 155  --   --   --  162  --  160  LABPROT  --   --   < > 36.7* 21.8* 19.2*  --   INR  --   --   < > 3.59 1.87 1.60  --   CREATININE 1.60* 1.60*  --  1.47* 1.47* 1.69*  --   < > = values in this interval not displayed.  Estimated Creatinine Clearance: 42.8 mL/min (by C-G formula based on SCr of 1.69 mg/dL (H)).  Assessment: 3 yom with history of PAF on warfarin PTA. Pharmacy consulted to dose inpatient.   INR down this am to 1.6 after dose was held 2/21 due to nausea/vomiting. Patient very sleepy this morning likely d/t phenergan received yesterday. Will reorder warfarin for tonight.   PTA warfarin dose: 10mg  daily except 15mg  on Wed/Sat (last dose 2/18)  Goal of Therapy:  INR 2-3 Monitor platelets by anticoagulation protocol: Yes   Plan:  Will try warfarin 15mg  again tonight Daily INR for now  Erin Hearing PharmD., [facility] Clinical Pharmacist Pager [phone] 10/16/2016 10:43 AM

## 2016-10-16 NOTE — Progress Notes (Signed)
Pt with 7 beats of V tach at 23:17. Pt asymptomatic. VS stable. 2L [place] applied to pt for comfort overnight while pt still very lethargic. Will continue to monitor.

## 2016-10-16 NOTE — Progress Notes (Addendum)
Pt had a small, formed BM yesterday 10-15-16 during evening shift change. Previously not documented by the NTs who bathed the pt. Will continue to monitor.

## 2016-10-16 NOTE — Progress Notes (Signed)
Patient ID: KEYVAN DEVEAUX, male   DOB: [birth year], 71 y.o.   MRN: [id number]    PROGRESS NOTE  WEYMAN MAUZEY  [id number] DOB: 19-Jan-1946 DOA: 10/13/2016  PCP: [name], MD   Brief Narrative:  71 y.o. male with medical history significant of NICM status post ICD, CHF with EF to 20-25% in 2014, DM-2, CKD, PAF on coumadin, bilateral BKA who presented to the ED with c/o left sided throbbing chest pain and SOB during that last two weeks. Patient has been seen by his cardiologist and Lasix was increased from 40 to 80 mg twice a day, but despite this measure he has worsening orthopnea and during the last two weeks sleeps on two pillows instead of one as before.  Assessment & Plan:  Acute on chronic exacerbation of systolic CHF - was on lasix 80 mg IV BID, now held - diuresing well, 8.1L negative - ECHo with EF of 20-25% and grade 2 diastolic dysfunction - appreciate CHF team for input, he is on Entresto at home, resumed -continue Coreg  Abd pain -due to CHF/ ? 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  B BKA - ambulates  with prosthesis  DVT prophylaxis: on Coumadin  Code Status: Full  Family Communication:none at bedside Disposition Plan: Home when clinically improved, euvolemic  Consultants:   CHF team  Procedures:   None  Antimicrobials:   None  Subjective: Still with abd pain, no Bm yet  Objective: Vitals:   10/15/16 2327 10/15/16 2330 10/16/16 0540 10/16/16 0829  BP: 135/73  (!) 148/75 135/75  Pulse: 98  100 99  Resp:   18 16  Temp:   98.7 F (37.1 C) 99.4 F (37.4 C)  TempSrc:   Oral Oral  SpO2: 95% 98% 98% 97%  Weight:   88.3 kg (194 lb 9.6 oz)   Height:        Intake/Output Summary (Last 24 hours) at 10/16/16 1052 Last data filed at 10/16/16 0544  Gross per 24 hour  Intake              540 ml  Output             2450 ml  Net            -1910 ml   Filed Weights   10/14/16 0501 10/15/16 0428 10/16/16 0540  Weight: 93.6 kg (206 lb 4.8 oz) 92.9 kg (204 lb 14.4 oz) 88.3 kg (194 lb 9.6 oz)   Examination:  General exam: Appears calm and uncomfortable  Respiratory system: diminished breath sounds at bases with crackles  Cardiovascular system: S1 & S2 heard,  RRR. B BKA Gastrointestinal system: Abdomen is distended, soft and nontender, umbilical hernia noted-reducible Central nervous system: Alert and oriented. No focal neurological deficits. Psychiatry: Judgement and insight appear normal. Mood & affect appropriate.  Ext: B/o BKA  Data Reviewed: I have personally reviewed following labs and imaging studies  CBC:  Recent Labs Lab 10/13/16 1213 10/13/16 1239 10/15/16 0438 10/16/16 0814  WBC 4.4  --  7.5 17.1*  NEUTROABS 3.4  --   --   --   HGB 11.3* 12.6* 12.5* 14.0  HCT 35.0* 37.0* 38.5* 42.1  MCV 74.9*  --  74.9* 75.3*  PLT 155  --  162 0000000   Basic Metabolic Panel:  Recent Labs Lab 10/13/16 1213 10/13/16 1239 10/13/16 1749 10/14/16 0402 10/15/16 0438 10/15/16 1410 10/16/16 0413  NA 144 145  --  141 140  --  144  K 4.0 4.1  --  3.5 3.5  --  3.5    CL 113* 111  --  108 101  --  102  CO2 21*  --   --  25 25  --  30  GLUCOSE 135* 134*  --  328* 240*  --  216*  BUN 29* 32*  --  27* 22*  --  24*  CREATININE 1.60* 1.60*  --  1.47* 1.47*  --  1.69*  CALCIUM 8.6*  --   --  8.4* 8.7*  --  9.1  MG  --   --  1.9  --   --  2.0  --    \Liver Function Tests:  Recent Labs Lab 10/13/16 1213 10/14/16 0402  AST 26 17  ALT 53 39  ALKPHOS 71 58  BILITOT 0.4 0.5  PROT 6.6 5.7*  ALBUMIN 3.4* 3.0*   Coagulation Profile:  Recent Labs Lab 10/13/16 1343 10/14/16 0402 10/15/16 0438 10/16/16 0413  INR 3.48 3.59 1.87 1.60   CBG:  Recent Labs Lab 10/15/16 0750 10/15/16 1204 10/15/16 1639 10/15/16 2052 10/16/16 0750  GLUCAP 243* 230* 196* 217* 241*   Urine analysis:    Component Value Date/Time   COLORURINE STRAW (A) 10/13/2016 1428   APPEARANCEUR CLEAR 10/13/2016 1428   LABSPEC 1.010 10/13/2016 1428   PHURINE 5.0 10/13/2016 1428   GLUCOSEU 50 (A) 10/13/2016 1428   HGBUR MODERATE (A) 10/13/2016 1428   BILIRUBINUR NEGATIVE 10/13/2016 1428   KETONESUR NEGATIVE 10/13/2016 1428   PROTEINUR 30 (A) 10/13/2016 1428   UROBILINOGEN 0.2 01/11/2015 1850   NITRITE NEGATIVE 10/13/2016 1428   LEUKOCYTESUR NEGATIVE 10/13/2016 1428   Radiology Studies: Ct Abdomen Pelvis Wo Contrast Result Date: 10/13/2016 1. Small bilateral pleural effusion. Bilateral lower lobe posterior atelectasis or infiltrate.  2. No nephrolithiasis. No hydronephrosis or hydroureter. Nonspecific mild bilateral perinephric stranding. No calcified ureteral calculi.  3. There is moderate distended urinary bladder without urinary bladder filling defects. No calcified calculi. Please note the urinary bladder measures at least 17 cm in length cranial caudally.  4. Normal appendix.  No pericecal inflammation.  5. No small bowel or colonic obstruction. Few diverticula are noted descending colon proximal sigmoid colon. No definite evidence of acute colitis or diverticulitis.   6. Limited evaluation of the pelvis due to metallic artifacts from left hip prosthesis.  7. There is disc space flattening with mild vacuum disc phenomenon and mild posterior disc bulge at L2-L3 level.  8. Mild anasarca infiltration of subcutaneous fat abdominal and pelvic wall.  9. There is a umbilical hernia containing omental fat best seen in sagittal image  93 measures 2 cm without evidence of acute complication. Mild skin thickening and subcutaneous stranding in lower anterior abdominal wall axial image 56 probable mild dermatitis or subcutaneous edema.   Dg Chest 2 View Result Date: 10/13/2016 Re- demonstration of heart failure with interstitial edema and effusions.   Scheduled Meds: . aspirin EC  81 mg Oral Daily  . atorvastatin  10 mg Oral Daily  . carvedilol  12.5 mg Oral BID WC  . insulin aspart  0-9 Units Subcutaneous TID WC  . insulin detemir  30 Units Subcutaneous BID  . polyethylene glycol  17 g Oral Daily  . potassium chloride (KCL MULTIRUN) 30 mEq in 265 mL IVPB  30 mEq Intravenous Once  . pregabalin  75 mg Oral QHS  . sacubitril-valsartan  1 tablet Oral BID  . senna-docusate  1 tablet Oral BID  . spironolactone  12.5 mg Oral Daily  . warfarin  15 mg Oral ONCE-1800  . Warfarin - Pharmacist Dosing Inpatient   Does not apply q1800   Continuous Infusions:   LOS: 3 days   Time spent:30 minutes   Domenic Polite, MD [facility] Pager [phone]  If 7PM-7AM, please contact night-coverage [email or web address] Password [facility] 10/16/2016, 10:52 AM

## 2016-10-16 NOTE — Progress Notes (Signed)
Advanced Heart Failure Rounding Note  PCP: Dr. Inda Merlin Primary Cardiologist: Dr. Caryl Comes   Subjective:    Admitted on 10/13/16 with SOB, acute on chronic systolic CHF. Also with abdominal pain and nausea/vomiting. Diuresed well with IV Lasix, restarted his home Entresto and added Spiro yesterday. Appears euvolemic to dry today.   Creatinine 1.47->1.69. Still with RLQ tenderness and vomiting. Abdominal CT neg on 10/13/16. No leukocytosis.   Seen by EP yesterday as he had NSVT on 10/07/16 (no shocks). Device reprogrammed to treat VT >130 bpm with ATP.   Feels weak, appears ill and is lethargic today, has been getting Phenergan overnight. Weight down 14 pounds since admission.   Objective:   Weight Range: 194 lb 9.6 oz (88.3 kg) Body mass index is 28.74 kg/m.   Vital Signs:   Temp:  [98.7 F (37.1 C)-99.3 F (37.4 C)] 98.7 F (37.1 C) (02/22 0540) Pulse Rate:  [92-100] 100 (02/22 0540) Resp:  [18] 18 (02/22 0540) BP: (135-148)/(71-76) 148/75 (02/22 0540) SpO2:  [93 %-98 %] 98 % (02/22 0540) Weight:  [194 lb 9.6 oz (88.3 kg)] 194 lb 9.6 oz (88.3 kg) (02/22 0540) Last BM Date: 10/13/16  Weight change: Filed Weights   10/14/16 0501 10/15/16 0428 10/16/16 0540  Weight: 206 lb 4.8 oz (93.6 kg) 204 lb 14.4 oz (92.9 kg) 194 lb 9.6 oz (88.3 kg)    Intake/Output:   Intake/Output Summary (Last 24 hours) at 10/16/16 0812 Last data filed at 10/16/16 0544  Gross per 24 hour  Intake              540 ml  Output             2450 ml  Net            -1910 ml     Physical Exam: General:  Ill appearing, lethargic.  HEENT: normal Neck: supple. No JVP . Carotids 2+ bilat; no bruits. No lymphadenopathy or thyromegaly appreciated. Cor: PMI nondisplaced. Regular rate & rhythm. No rubs, gallops or murmurs. Lungs: Clear in all lobes.  Abdomen: soft, nontender, nondistended. No hepatosplenomegaly. No bruits or masses. Good bowel sounds. Extremities: no cyanosis, clubbing, rash. Bilateral BKA.  No thigh edema Neuro: alert & orientedx3, cranial nerves grossly intact. moves all 4 extremities w/o difficulty. Affect pleasant   Telemetry: V paced, RBBB  Labs: CBC  Recent Labs  10/13/16 1213 10/13/16 1239 10/15/16 0438  WBC 4.4  --  7.5  NEUTROABS 3.4  --   --   HGB 11.3* 12.6* 12.5*  HCT 35.0* 37.0* 38.5*  MCV 74.9*  --  74.9*  PLT 155  --  0000000   Basic Metabolic Panel  Recent Labs  10/13/16 1749  10/15/16 0438 10/15/16 1410 10/16/16 0413  NA  --   < > 140  --  144  K  --   < > 3.5  --  3.5  CL  --   < > 101  --  102  CO2  --   < > 25  --  30  GLUCOSE  --   < > 240*  --  216*  BUN  --   < > 22*  --  24*  CREATININE  --   < > 1.47*  --  1.69*  CALCIUM  --   < > 8.7*  --  9.1  MG 1.9  --   --  2.0  --   < > = values in this interval not displayed.  Liver Function Tests  Recent Labs  10/13/16 1213 10/14/16 0402  AST 26 17  ALT 53 39  ALKPHOS 71 58  BILITOT 0.4 0.5  PROT 6.6 5.7*  ALBUMIN 3.4* 3.0*   BNP (last 3 results)  Recent Labs  12/28/15 0550 10/13/16 1401  BNP 380.3* 1,039.0*    Hemoglobin A1C  Recent Labs  10/15/16 0438  HGBA1C 11.0*    Medications:     Scheduled Medications: . aspirin EC  81 mg Oral Daily  . atorvastatin  10 mg Oral Daily  . carvedilol  12.5 mg Oral BID WC  . furosemide  80 mg Intravenous BID  . insulin aspart  0-9 Units Subcutaneous TID WC  . insulin detemir  30 Units Subcutaneous BID  . polyethylene glycol  17 g Oral Daily  . pregabalin  75 mg Oral QHS  . sacubitril-valsartan  1 tablet Oral BID  . senna-docusate  1 tablet Oral BID  . spironolactone  12.5 mg Oral Daily  . warfarin  15 mg Oral ONCE-1800  . Warfarin - Pharmacist Dosing Inpatient   Does not apply q1800     PRN Medications:  acetaminophen, guaiFENesin-dextromethorphan, ondansetron   Assessment/Plan   1. Acute on chronic combined diastolic and systolic CHF, NYHA class IIIb:  - Appears dry today, will stop Lasix today. Replace K.  -  Continue Spiro 12.5mg  daily.  - Continue Entresto 24/26 mg BID - Will consult heart failure navigator for education.  - Daily weights and strict I&O.  - Continue Coreg  2. PAF, s/p CRT-D: Follows with Dr. Caryl Comes, last seen earlier this month.  He is in NSR today.  - Device was reprogrammed this admission to lower threshold on VT rate for therapy.  - This patients CHA2DS2-VASc Score and unadjusted Ischemic Stroke Rate (% per year) is equal to 7.2 % stroke rate/year from a score of 5 Above score calculated as 1 point each if present [CHF, HTN, DM, Vascular=MI/PAD/Aortic Plaque, Age if 65-74, or Male], 2 points each if present [Age > 75, or Stroke/TIA/TE] - Continue warfarin.  3. Abdominal pain: With vomiting. Continues to have abdominal pain. No leukocytosis, abdominal CT without acute process. ?Constipation, still has not had bowel movement.  4. CKD stage III:  - baseline 1.40-1.42.  - Elevated this am, but likely representative of volume depletion in the setting of nausea/vomiting.  5. Obesity - Counseled about sodium restricted diet.   Length of Stay: [place], NP  10/16/2016, 8:12 AM  Advanced Heart Failure Team Pager [phone] (M-F; 7a - 4p)  Please contact [facility] for night-coverage after hours (4p -7a ) and weekends on [email or web address]  Patient seen with NP, agree with the above note.  He does not look volume overloaded this morning.  With rise in creatinine, would hold all diuretics today.  Once he has recovered from this hospitalization, I think that he would be a reasonable Cardiomems candidate.   ICD reprogrammed to lower VT detection threshold.    Main issue at this point continues to be RLQ pain and nausea.  CT abdomen was unremarkable.  Drowsy today after getting Phenergan.  ?Constipation, to get enema this morning.   Loralie Champagne 10/16/2016 8:37 AM

## 2016-10-16 NOTE — Discharge Instructions (Addendum)
Flush drain with 5-10cc of normal saline daily Record output from biliary drain daily and bring it with you to follow up radiology appointment in 5 weeks  Information on my medicine - Coumadin   (Warfarin)  This medication education was reviewed with me or my healthcare representative as part of my discharge preparation.  The pharmacist that spoke with me during my hospital stay was:  Georgina Peer, [facility]  Why was Coumadin prescribed for you? Coumadin was prescribed for you because you have a blood clot or a medical condition that can cause an increased risk of forming blood clots. Blood clots can cause serious health problems by blocking the flow of blood to the heart, lung, or brain. Coumadin can prevent harmful blood clots from forming. As a reminder your indication for Coumadin is:   Stroke Prevention Because Of Atrial Fibrillation  What test will check on my response to Coumadin? While on Coumadin (warfarin) you will need to have an INR test regularly to ensure that your dose is keeping you in the desired range. The INR (international normalized ratio) number is calculated from the result of the laboratory test called prothrombin time (PT).  If an INR APPOINTMENT HAS NOT ALREADY BEEN MADE FOR YOU please schedule an appointment to have this lab work done by your health care provider within 7 days. Your INR goal is usually a number between:  2 to 3 or your provider may give you a more narrow range like 2-2.5.  Ask your health care provider during an office visit what your goal INR is.  What  do you need to  know  About  COUMADIN? Take Coumadin (warfarin) exactly as prescribed by your healthcare provider about the same time each day.  DO NOT stop taking without talking to the doctor who prescribed the medication.  Stopping without other blood clot prevention medication to take the place of Coumadin may increase your risk of developing a new clot or stroke.  Get refills before you run  out.  What do you do if you miss a dose? If you miss a dose, take it as soon as you remember on the same day then continue your regularly scheduled regimen the next day.  Do not take two doses of Coumadin at the same time.  Important Safety Information A possible side effect of Coumadin (Warfarin) is an increased risk of bleeding. You should call your healthcare provider right away if you experience any of the following: ? Bleeding from an injury or your nose that does not stop. ? Unusual colored urine (red or dark brown) or unusual colored stools (red or black). ? Unusual bruising for unknown reasons. ? A serious fall or if you hit your head (even if there is no bleeding).  Some foods or medicines interact with Coumadin (warfarin) and might alter your response to warfarin. To help avoid this: ? Eat a balanced diet, maintaining a consistent amount of Vitamin K. ? Notify your provider about major diet changes you plan to make. ? Avoid alcohol or limit your intake to 1 drink for women and 2 drinks for men per day. (1 drink is 5 oz. wine, 12 oz. beer, or 1.5 oz. liquor.)  Make sure that ANY health care provider who prescribes medication for you knows that you are taking Coumadin (warfarin).  Also make sure the healthcare provider who is monitoring your Coumadin knows when you have started a new medication including herbals and non-prescription products.  Coumadin (Warfarin)  Major  Drug Interactions  Increased Warfarin Effect Decreased Warfarin Effect  Alcohol (large quantities) Antibiotics (esp. Septra/Bactrim, Flagyl, Cipro) Amiodarone (Cordarone) Aspirin (ASA) Cimetidine (Tagamet) Megestrol (Megace) NSAIDs (ibuprofen, naproxen, etc.) Piroxicam (Feldene) Propafenone (Rythmol SR) Propranolol (Inderal) Isoniazid (INH) Posaconazole (Noxafil) Barbiturates (Phenobarbital) Carbamazepine (Tegretol) Chlordiazepoxide (Librium) Cholestyramine (Questran) Griseofulvin Oral  Contraceptives Rifampin Sucralfate (Carafate) Vitamin K   Coumadin (Warfarin) Major Herbal Interactions  Increased Warfarin Effect Decreased Warfarin Effect  Garlic Ginseng Ginkgo biloba Coenzyme Q10 Green tea St. Johns wort    Coumadin (Warfarin) FOOD Interactions  Eat a consistent number of servings per week of foods HIGH in Vitamin K (1 serving =  cup)  Collards (cooked, or boiled & drained) Kale (cooked, or boiled & drained) Mustard greens (cooked, or boiled & drained) Parsley *serving size only =  cup Spinach (cooked, or boiled & drained) Swiss chard (cooked, or boiled & drained) Turnip greens (cooked, or boiled & drained)  Eat a consistent number of servings per week of foods MEDIUM-HIGH in Vitamin K (1 serving = 1 cup)  Asparagus (cooked, or boiled & drained) Broccoli (cooked, boiled & drained, or raw & chopped) Brussel sprouts (cooked, or boiled & drained) *serving size only =  cup Lettuce, raw (green leaf, endive, romaine) Spinach, raw Turnip greens, raw & chopped   These websites have more information on Coumadin (warfarin):  FailFactory.se; VeganReport.com.au;

## 2016-10-16 NOTE — Progress Notes (Signed)
Pt's bedtime PO meds held until 1am, when pt more alert and able to follow commend to swallow liquids. Will continue to monitor.

## 2016-10-16 NOTE — Consult Note (Signed)
[facility] Surgery Consult Note  [name] Oct 15, 1945  [id number].    Requesting MD: Broadus John Chief Complaint/Reason for Consult: Abdominal pain  HPI:  [name] [name] is a 71yo male PMH NICM s/p ICD, CHF with EF to 20-25%, DM-2, CKD, PAF on coumadin, h/o bilateral BKA, admitted to [facility] 10/13/16 with CHF exacerbation. Patient is sedated at this time, but wife is at bedside who helped with history. States that he had had 2 weeks of increased chest pain and shortness of breath. He was also complaining of abdominal pain and diarrhea at the time. CT scan of his abdomen showed an umbilical hernia containing only fat. General surgery consulted to evaluate abdominal pain. Since admission he has not had a bowel movement. He vomited this morning with trying to drink clear liquids and laxative. He continues to have abdominal bloating. Unsure if he is passing flatus at this time. WBC up to 17.1 today from 7.5, but he is afebrile.  PMH significant for NICM s/p ICD, CHF with EF to 20-25%, DM-2, CKD, PAF on coumadin, h/o bilateral BKA No prior h/o abdominal surgery Anticoagulants: takes coumadin at home Lives at home with his wife  ROS: Review of Systems  Constitutional: Negative.   HENT: Negative.   Eyes: Negative.   Respiratory: Positive for shortness of breath.   Cardiovascular: Positive for chest pain and orthopnea.  Gastrointestinal: Positive for abdominal pain, constipation, nausea and vomiting.  Genitourinary: Negative.   Musculoskeletal: Negative.   Skin: Negative.   All systems reviewed and otherwise negative except for as above  Family History  Problem Relation Age of Onset  . Heart disease Mother   . Hypertension Mother   . Diabetes Mother   . Diabetes Father     Past Medical History:  Diagnosis Date  . Anemia    takes Iron pill daily  . Arthritis   . Automatic implantable cardioverter-defibrillator in situ    a. 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  . CAD (coronary artery disease)   . Cardiomyopathy, nonischemic ([place])    a. Cath 2003: mild nonobstructive CAD, EF 25% at that time.  . Chronic systolic CHF (congestive heart failure) (HCC)    takes Furosemide daily as well as Aldactone  . CKD (chronic kidney disease), stage III   . Complication of anesthesia   . Constipation    takes Sennokot daily  . Dementia   . GERD (gastroesophageal reflux disease)    takes Nexium daily  . Headache    occasionally  . High cholesterol    takes Atorvastatin daily  . History of blood transfusion    no abnormal reaction noted  . History of bronchitis    > 1 yr ago  . History of kidney stones   . Hypertension    takes Coreg daily  . LBBB (left bundle branch block)    takes Coumadin daily  . NSVT (nonsustained ventricular tachycardia) ([place])    a. Noted on ICD interrogation in 2011.  [name] PAD (peripheral artery disease) ([place])    a. 08/2013 Periph Angio/PTA: Abd Ao nl, RLE- 3v runoff, PT diff dzs, AT 90p, LLE 2v runoff, PT 100, AT 69m(diamondback ORA/chocolate balloon PTA).  .Marland KitchenPAF (paroxysmal atrial fibrillation) ([place]    a. Noted on ICD interrogation 2012;  b. coumadin d/c'd 01/2013.  .Marland KitchenPAF (paroxysmal atrial fibrillation) ([place]   . Peripheral neuropathy (HCC)    hands;numbness and tingling   . Pneumonia    hx of >  68yrago  . PONV (postoperative nausea and vomiting)   . Type II diabetes mellitus ([place]    takes Levemir daily as well as Novolog  . Urinary frequency   . Urinary urgency     Past Surgical History:  Procedure Laterality Date  . ABDOMINAL ANGIOGRAM  09/12/2013   Procedure: ABDOMINAL ANGIOGRAM;  Surgeon: JLorretta Harp MD;  [facility];  Service: Cardiovascular;;  . AMPUTATION Left 10/04/2013   Procedure: LEFT GREAT TOE AND SECOND TOE AMPUTATION;  Surgeon: NMarianna Payment MD;  [place]  Service: Orthopedics;  Laterality: Left;  . AMPUTATION Left 11/09/2013   Procedure: LEFT  AMPUTATION BELOW KNEE;  Surgeon: NMarianna Payment MD;  [place]  Service: Orthopedics;  Laterality: Left;  . AMPUTATION Right 01/04/2014   Procedure: GDoristine Devoidsecond and third toe amputation;  Surgeon: NMarianna Payment MD;  [place]  Service: Orthopedics;  Laterality: Right;  . AMPUTATION Right 01/30/2014   Procedure: RIGHT BELOW KNEE AMPUTATION;  Surgeon: NMarianna Payment MD;  [place]  Service: Orthopedics;  Laterality: Right;  . ATHERECTOMY Right 12/29/2013   Procedure: ATHERECTOMY;  Surgeon: JLorretta Harp MD;  [facility];  Service: Cardiovascular;  Laterality: Right;  Anterior Tibial Artery  . CARDIAC CATHETERIZATION  10/01/2001   THERE WAS GLOBAL HYPOKINESIS AND EF 25%. THERE APPEARED TO BE GLOBAL DECREASE IN WALL MOTION  . CARDIAC DEFIBRILLATOR PLACEMENT  06/2009   WITH GENERATOR REPLACED; BiV ICD  . CARDIOVASCULAR STRESS TEST  03/20/2009   EF 33%  . [place] COLONOS[place]    . ESOPHAGOGASTRODUODENOSCOPY    . HIP ARTHROPLASTY Left 12/28/2015   Procedure: POSTERIOR  APPROACH HEMI HIP ARTHROPLASTY;  Surgeon: NLeandrew Koyanagi MD;  [place]  Service: Orthopedics;  Laterality: Left;  . LEG AMPUTATION BELOW KNEE Left 11/09/2013   DR XErlinda Hong . LITHOTRIPSY  2001  . LOWER EXTREMITY ANGIOGRAM Bilateral 09/12/2013   Procedure: LOWER EXTREMITY ANGIOGRAM;  Surgeon: JLorretta Harp MD;  [facility];  Service: Cardiovascular;  Laterality: Bilateral;  . LOWER EXTREMITY ANGIOGRAM N/A 12/29/2013   Procedure: LOWER EXTREMITY ANGIOGRAM;  Surgeon: JLorretta Harp MD;  [facility];  Service: Cardiovascular;  Laterality: N/A;  . STUMP REVISION Left 01/03/2015   Procedure: LEFT BELOW KNEE AMPUTATION REVISION;  Surgeon: NLeandrew Koyanagi MD;  [place]  Service: Orthopedics;  Laterality: Left;  . TOE AMPUTATION  10/04/2013   LEFT GREAT TOE AND 4TH TOE   /   DR XErlinda Hong . TRANSLUMINAL ATHERECTOMY TIBIAL ARTERY Left 09/12/2013  . [place] ECHOCARDIOGRAPHY  03/21/2008   EF 30-35%    Social History:  reports that he has never smoked. He has never used smokeless tobacco. He reports that he does not drink alcohol or use drugs.  Allergies: No Known Allergies  Medications Prior to Admission  Medication Sig Dispense Refill  . acetaminophen (TYLENOL) 325 MG tablet Take 2 tablets (650 mg total) by mouth every 6 (six) hours as needed for mild pain (or Fever >/= 101).    .Marland Kitchenaspirin EC 81 MG tablet Take 81 mg by mouth daily.    .Marland Kitchenatorvastatin (LIPITOR) 10 MG tablet Take 1 tablet (10 mg total) by mouth daily. 30 tablet 1  . carvedilol (COREG) 12.5 MG tablet Take 1 tablet (12.5 mg total) by mouth 2 (two) times daily with a meal. 60 tablet 1  . Cholecalciferol (  VITAMIN D3) 10000 units TABS Take 1 tablet by mouth daily.    . furosemide (LASIX) 40 MG tablet Take 1 tablet (40 mg total) by mouth daily. (Patient taking differently: Take 40-80 mg by mouth daily. Monday,wednesday,friday, Saturday ( 80 mg 4  Days a week ) all other days 40 mg) 30 tablet 1  . insulin detemir (LEVEMIR) 100 UNIT/ML injection 25 unit AM and 35 unit PM (Patient taking differently: Inject 25-35 Units into the skin See admin instructions. 25 unit AM and 35 unit PM) 10 mL 1  . iron polysaccharides (NIFEREX) 150 MG capsule Take 1 capsule (150 mg total) by mouth daily. 30 capsule 1  . Multiple Vitamin (MULTIVITAMIN WITH MINERALS) TABS tablet Take 1 tablet by mouth daily.    [name] omeprazole (PRILOSEC) 20 MG capsule Take 20 mg by mouth daily.    . pregabalin (LYRICA) 75 MG capsule Take 1 capsule (75 mg total) by mouth 2 (two) times daily. 60 capsule 1  . sacubitril-valsartan (ENTRESTO) 24-26 MG Take 1 tablet by mouth 2 (two) times daily. 60 tablet 6  . senna-docusate (SENOKOT-S) 8.6-50 MG per tablet Take 1 tablet by mouth 2 (two) times daily. 60 tablet 1  . warfarin (COUMADIN) 10 MG tablet Take 10-15 mg by mouth daily. Wednesday, Saturday 15 mg and all other days are 10 mg    .  warfarin (COUMADIN) 10 MG tablet TAKE AS DIRECTED BY COUMADIN CLINIC (Patient not taking: Reported on 10/13/2016) 100 tablet 0    Prior to Admission medications   Medication Sig Start Date End Date Taking? Authorizing Provider  acetaminophen (TYLENOL) 325 MG tablet Take 2 tablets (650 mg total) by mouth every 6 (six) hours as needed for mild pain (or Fever >/= 101). 05/03/16  Yes Maryann Mikhail, DO  aspirin EC 81 MG tablet Take 81 mg by mouth daily.   Yes Historical Provider, MD  atorvastatin (LIPITOR) 10 MG tablet Take 1 tablet (10 mg total) by mouth daily. 01/15/15  Yes Daniel J Angiulli, PA-C  carvedilol (COREG) 12.5 MG tablet Take 1 tablet (12.5 mg total) by mouth 2 (two) times daily with a meal. 01/15/15  Yes Daniel J Angiulli, PA-C  Cholecalciferol (VITAMIN D3) 10000 units TABS Take 1 tablet by mouth daily.   Yes Historical Provider, MD  furosemide (LASIX) 40 MG tablet Take 1 tablet (40 mg total) by mouth daily. Patient taking differently: Take 40-80 mg by mouth daily. Monday,wednesday,friday, Saturday ( 80 mg 4  Days a week ) all other days 40 mg 01/02/16  Yes Theodis Blaze, MD  insulin detemir (LEVEMIR) 100 UNIT/ML injection 25 unit AM and 35 unit PM Patient taking differently: Inject 25-35 Units into the skin See admin instructions. 25 unit AM and 35 unit PM 01/02/16  Yes Theodis Blaze, MD  iron polysaccharides (NIFEREX) 150 MG capsule Take 1 capsule (150 mg total) by mouth daily. 01/15/15  Yes Daniel J Angiulli, PA-C  Multiple Vitamin (MULTIVITAMIN WITH MINERALS) TABS tablet Take 1 tablet by mouth daily.   Yes Historical Provider, MD  omeprazole (PRILOSEC) 20 MG capsule Take 20 mg by mouth daily.   Yes Historical Provider, MD  pregabalin (LYRICA) 75 MG capsule Take 1 capsule (75 mg total) by mouth 2 (two) times daily. 01/15/15  Yes Daniel J Angiulli, PA-C  sacubitril-valsartan (ENTRESTO) 24-26 MG Take 1 tablet by mouth 2 (two) times daily. 10/31/15  Yes Deboraha Sprang, MD  senna-docusate  (SENOKOT-S) 8.6-50 MG per tablet Take 1 tablet by mouth  2 (two) times daily. 11/28/13  Yes Ivan Anchors Love, PA-C  warfarin (COUMADIN) 10 MG tablet Take 10-15 mg by mouth daily. Wednesday, Saturday 15 mg and all other days are 10 mg   Yes Historical Provider, MD  warfarin (COUMADIN) 10 MG tablet TAKE AS DIRECTED BY COUMADIN CLINIC Patient not taking: Reported on 10/13/2016 07/24/16   Deboraha Sprang, MD    Blood pressure 117/60, pulse (!) 102, temperature 98.1 F (36.7 C), temperature source Oral, resp. rate 18, height _0  (1.753 m), weight 194 lb 9.6 oz (88.3 kg), SpO2 98 %. Physical Exam: General: sedated AA male who is laying in bed in NAD HEENT: head is normocephalic, atraumatic.  Sclera are noninjected.  Mouth is pink and moist Heart: regular rate, and rhythm.  No obvious murmurs, gallops, or rubs noted.   Lungs: CTAB, no wheezes, rhonchi, or rales noted.  Respiratory effort nonlabored Abd: obese, soft, distended, present but hypoactive BS, no masses or organomegaly. Patient grimaces with light and deep palpation in all 4 quadrants, but he does not grimace with palpation of umbilical hernia. Hernia is soft and reducible MS: s/p B BKA Skin: warm and dry with no masses, lesions, or rashes Psych: sleeping Neuro: unable to assess  Results for orders placed or performed during the hospital encounter of 10/13/16 (from the past 48 hour(s))  Glucose, capillary     Status: Abnormal   Collection Time: 10/14/16  5:17 PM  Result Value Ref Range   Glucose-Capillary 177 (H) 65 - 99 mg/dL   Comment 1 Notify RN   Glucose, capillary     Status: Abnormal   Collection Time: 10/14/16  9:36 PM  Result Value Ref Range   Glucose-Capillary 197 (H) 65 - 99 mg/dL   Comment 1 Notify RN    Comment 2 Document in Chart   Basic metabolic panel     Status: Abnormal   Collection Time: 10/15/16  4:38 AM  Result Value Ref Range   Sodium 140 135 - 145 mmol/L   Potassium 3.5 3.5 - 5.1 mmol/L   Chloride 101 101 - 111  mmol/L   CO2 25 22 - 32 mmol/L   Glucose, Bld 240 (H) 65 - 99 mg/dL   BUN 22 (H) 6 - 20 mg/dL   Creatinine, Ser 1.47 (H) 0.61 - 1.24 mg/dL   Calcium 8.7 (L) 8.9 - 10.3 mg/dL   GFR calc non Af Amer 46 (L) >60 mL/min   GFR calc Af Amer 53 (L) >60 mL/min    Comment: (NOTE) The eGFR has been calculated using the CKD EPI equation. This calculation has not been validated in all clinical situations. eGFR's persistently <60 mL/min signify possible Chronic Kidney Disease.    Anion gap 14 5 - 15  Protime-INR     Status: Abnormal   Collection Time: 10/15/16  4:38 AM  Result Value Ref Range   Prothrombin Time 21.8 (H) 11.4 - 15.2 seconds   INR 1.87   CBC     Status: Abnormal   Collection Time: 10/15/16  4:38 AM  Result Value Ref Range   WBC 7.5 4.0 - 10.5 K/uL   RBC 5.14 4.22 - 5.81 MIL/uL   Hemoglobin 12.5 (L) 13.0 - 17.0 g/dL   HCT 38.5 (L) 39.0 - 52.0 %   MCV 74.9 (L) 78.0 - 100.0 fL   MCH 24.3 (L) 26.0 - 34.0 pg   MCHC 32.5 30.0 - 36.0 g/dL   RDW 16.7 (H) 11.5 - 15.5 %  Platelets 162 150 - 400 K/uL  Hemoglobin A1c     Status: Abnormal   Collection Time: 10/15/16  4:38 AM  Result Value Ref Range   Hgb A1c MFr Bld 11.0 (H) 4.8 - 5.6 %    Comment: (NOTE)         Pre-diabetes: 5.7 - 6.4         Diabetes: >6.4         Glycemic control for adults with diabetes: <7.0    Mean Plasma Glucose 269     Comment: (NOTE) Performed At: [facility] [address] [place], [place] [id number] Lindon Romp MD OZ:[id number]   Glucose, capillary     Status: Abnormal   Collection Time: 10/15/16  7:50 AM  Result Value Ref Range   Glucose-Capillary 243 (H) 65 - 99 mg/dL   Comment 1 Notify RN    Comment 2 Document in Chart   Glucose, capillary     Status: Abnormal   Collection Time: 10/15/16 12:04 PM  Result Value Ref Range   Glucose-Capillary 230 (H) 65 - 99 mg/dL   Comment 1 Notify RN    Comment 2 Document in Chart   Magnesium     Status: None   Collection Time: 10/15/16   2:10 PM  Result Value Ref Range   Magnesium 2.0 1.7 - 2.4 mg/dL  Glucose, capillary     Status: Abnormal   Collection Time: 10/15/16  4:39 PM  Result Value Ref Range   Glucose-Capillary 196 (H) 65 - 99 mg/dL   Comment 1 Notify RN    Comment 2 Document in Chart   Glucose, capillary     Status: Abnormal   Collection Time: 10/15/16  8:52 PM  Result Value Ref Range   Glucose-Capillary 217 (H) 65 - 99 mg/dL   Comment 1 Notify RN    Comment 2 Document in Chart   Basic metabolic panel     Status: Abnormal   Collection Time: 10/16/16  4:13 AM  Result Value Ref Range   Sodium 144 135 - 145 mmol/L   Potassium 3.5 3.5 - 5.1 mmol/L   Chloride 102 101 - 111 mmol/L   CO2 30 22 - 32 mmol/L   Glucose, Bld 216 (H) 65 - 99 mg/dL   BUN 24 (H) 6 - 20 mg/dL   Creatinine, Ser 1.69 (H) 0.61 - 1.24 mg/dL   Calcium 9.1 8.9 - 10.3 mg/dL   GFR calc non Af Amer 38 (L) >60 mL/min   GFR calc Af Amer 45 (L) >60 mL/min    Comment: (NOTE) The eGFR has been calculated using the CKD EPI equation. This calculation has not been validated in all clinical situations. eGFR's persistently <60 mL/min signify possible Chronic Kidney Disease.    Anion gap 12 5 - 15  Protime-INR     Status: Abnormal   Collection Time: 10/16/16  4:13 AM  Result Value Ref Range   Prothrombin Time 19.2 (H) 11.4 - 15.2 seconds   INR 1.60   Glucose, capillary     Status: Abnormal   Collection Time: 10/16/16  7:50 AM  Result Value Ref Range   Glucose-Capillary 241 (H) 65 - 99 mg/dL  CBC     Status: Abnormal   Collection Time: 10/16/16  8:14 AM  Result Value Ref Range   WBC 17.1 (H) 4.0 - 10.5 K/uL   RBC 5.59 4.22 - 5.81 MIL/uL   Hemoglobin 14.0 13.0 - 17.0 g/dL   HCT 42.1 39.0 -  52.0 %   MCV 75.3 (L) 78.0 - 100.0 fL   MCH 25.0 (L) 26.0 - 34.0 pg   MCHC 33.3 30.0 - 36.0 g/dL   RDW 16.7 (H) 11.5 - 15.5 %   Platelets 160 150 - 400 K/uL  Glucose, capillary     Status: Abnormal   Collection Time: 10/16/16 11:24 AM  Result Value  Ref Range   Glucose-Capillary 247 (H) 65 - 99 mg/dL   No results found.    Assessment/Plan Abdominal pain, nausea, and vomiting - abdominal pain and diarrhea 3 days ago on arrival. Now with persistent abdominal bloating, no BM in 3 days, unsure if he is passing flatus - CT scan 1/49 showed an umbilical hernia containing only fat - WBC up to 17.1 today from 7.5, but he is afebrile  NICM s/p ICD CHF with EF to 20-25% DM-2 CKD PAF on coumadin H/o bilateral BKA  ID - none VTE - coumadin FEN - IVF, clears  Plan - Abdominal XR is pending. Although patient is very drowsy, on exam he does not appear to be tender on his umbilical hernia; that is the one spot on his abdomen that he does not grimace with palpation. It is soft and reducible. He is however tender globally about his abdomen, appears distended, and has hypoactive bowel sounds. He may have an ileus vs SBO, with SBO less likely as patient has had no previous abdominal surgeries. Will await abdominal xray for further recommendations, and consult to maximize mobility.  Jerrye Beavers, [facility] Surgery 10/16/2016, 2:03 PM Pager: [phone] Consults: [phone] Mon-Fri 7:00 am-4:30 pm Sat-Sun 7:00 am-11:30 am

## 2016-10-16 NOTE — Progress Notes (Signed)
Patient sleepy but will respond to voice and touch, oriented x 4. VSS. Tap water enema  And dulcolax  Given per order with no result. Patient condition same as this am. Patient wife stated patient had diarrhea couple days ago.will continue to monitor the patient.

## 2016-10-17 ENCOUNTER — Inpatient Hospital Stay (HOSPITAL_COMMUNITY): Payer: Medicare Other

## 2016-10-17 DIAGNOSIS — N183 Chronic kidney disease, stage 3 (moderate): Secondary | ICD-10-CM

## 2016-10-17 DIAGNOSIS — N170 Acute kidney failure with tubular necrosis: Secondary | ICD-10-CM

## 2016-10-17 LAB — BLOOD GAS, ARTERIAL
Acid-Base Excess: 7.6 mmol/L — ABNORMAL HIGH (ref 0.0–2.0)
Acid-Base Excess: 5.9 mmol/L — ABNORMAL HIGH (ref 0.0–2.0)
BICARBONATE: 31.6 mmol/L — AB (ref 20.0–28.0)
Bicarbonate: 30.1 mmol/L — ABNORMAL HIGH (ref 20.0–28.0)
Drawn by: 275531
Drawn by: 10006
FIO2: 21
O2 CONTENT: 5 L/min
O2 SAT: 93.7 %
O2 Saturation: 90.8 %
PCO2 ART: 45.1 mmHg (ref 32.0–48.0)
PH ART: 7.465 — AB (ref 7.350–7.450)
PH ART: 7.438 (ref 7.350–7.450)
PO2 ART: 68.8 mmHg — AB (ref 83.0–108.0)
Patient temperature: 98.6
Patient temperature: 98.6
pCO2 arterial: 44.5 mmHg (ref 32.0–48.0)
pO2, Arterial: 58.3 mmHg — ABNORMAL LOW (ref 83.0–108.0)

## 2016-10-17 LAB — GLUCOSE, CAPILLARY
GLUCOSE-CAPILLARY: 137 mg/dL — AB (ref 65–99)
Glucose-Capillary: 167 mg/dL — ABNORMAL HIGH (ref 65–99)
Glucose-Capillary: 129 mg/dL — ABNORMAL HIGH (ref 65–99)
Glucose-Capillary: 126 mg/dL — ABNORMAL HIGH (ref 65–99)
Glucose-Capillary: 136 mg/dL — ABNORMAL HIGH (ref 65–99)
Glucose-Capillary: 141 mg/dL — ABNORMAL HIGH (ref 65–99)

## 2016-10-17 LAB — CBC WITH DIFFERENTIAL/PLATELET
Basophils Absolute: 0 10*3/uL (ref 0.0–0.1)
Basophils Relative: 0 %
EOS PCT: 0 %
Eosinophils Absolute: 0 10*3/uL (ref 0.0–0.7)
HCT: 40.1 % (ref 39.0–52.0)
Hemoglobin: 13.3 g/dL (ref 13.0–17.0)
LYMPHS ABS: 0.6 10*3/uL — AB (ref 0.7–4.0)
Lymphocytes Relative: 4 %
MCH: 25 pg — ABNORMAL LOW (ref 26.0–34.0)
MCHC: 33.2 g/dL (ref 30.0–36.0)
MCV: 75.2 fL — AB (ref 78.0–100.0)
MONO ABS: 0.6 10*3/uL (ref 0.1–1.0)
Monocytes Relative: 4 %
NEUTROS ABS: 14.5 10*3/uL — AB (ref 1.7–7.7)
Neutrophils Relative %: 92 %
PLATELETS: 125 10*3/uL — AB (ref 150–400)
RBC: 5.33 MIL/uL (ref 4.22–5.81)
RDW: 16.2 % — AB (ref 11.5–15.5)
WBC Morphology: INCREASED
WBC: 15.7 10*3/uL — AB (ref 4.0–10.5)

## 2016-10-17 LAB — COMPREHENSIVE METABOLIC PANEL
ALT: 26 U/L (ref 17–63)
AST: 21 U/L (ref 15–41)
Albumin: 2.5 g/dL — ABNORMAL LOW (ref 3.5–5.0)
Alkaline Phosphatase: 52 U/L (ref 38–126)
Anion gap: 13 (ref 5–15)
BILIRUBIN TOTAL: 0.8 mg/dL (ref 0.3–1.2)
BUN: 56 mg/dL — AB (ref 6–20)
CO2: 29 mmol/L (ref 22–32)
CREATININE: 3.05 mg/dL — AB (ref 0.61–1.24)
Calcium: 8.3 mg/dL — ABNORMAL LOW (ref 8.9–10.3)
Chloride: 106 mmol/L (ref 101–111)
GFR calc Af Amer: 22 mL/min — ABNORMAL LOW (ref >60)
GFR, EST NON AFRICAN AMERICAN: 19 mL/min — AB (ref >60)
GLUCOSE: 132 mg/dL — AB (ref 65–99)
Potassium: 3.6 mmol/L (ref 3.5–5.1)
Sodium: 148 mmol/L — ABNORMAL HIGH (ref 135–145)
TOTAL PROTEIN: 6.2 g/dL — AB (ref 6.5–8.1)

## 2016-10-17 LAB — BASIC METABOLIC PANEL
Anion gap: 13 (ref 5–15)
BUN: 50 mg/dL — ABNORMAL HIGH (ref 6–20)
CALCIUM: 8.8 mg/dL — AB (ref 8.9–10.3)
CO2: 30 mmol/L (ref 22–32)
CREATININE: 3.05 mg/dL — AB (ref 0.61–1.24)
Chloride: 102 mmol/L (ref 101–111)
GFR calc Af Amer: 22 mL/min — ABNORMAL LOW (ref >60)
GFR, EST NON AFRICAN AMERICAN: 19 mL/min — AB (ref >60)
GLUCOSE: 201 mg/dL — AB (ref 65–99)
Potassium: 3.7 mmol/L (ref 3.5–5.1)
Sodium: 145 mmol/L (ref 135–145)

## 2016-10-17 LAB — CBC
HCT: 40 % (ref 39.0–52.0)
Hemoglobin: 13.2 g/dL (ref 13.0–17.0)
MCH: 24.9 pg — ABNORMAL LOW (ref 26.0–34.0)
MCHC: 33 g/dL (ref 30.0–36.0)
MCV: 75.3 fL — AB (ref 78.0–100.0)
PLATELETS: 137 10*3/uL — AB (ref 150–400)
RBC: 5.31 MIL/uL (ref 4.22–5.81)
RDW: 16.3 % — AB (ref 11.5–15.5)
WBC: 17.1 10*3/uL — ABNORMAL HIGH (ref 4.0–10.5)

## 2016-10-17 LAB — PROTIME-INR
INR: 2.38
Prothrombin Time: 26.4 seconds — ABNORMAL HIGH (ref 11.4–15.2)

## 2016-10-17 LAB — LACTIC ACID, PLASMA
Lactic Acid, Venous: 1.5 mmol/L (ref 0.5–1.9)
Lactic Acid, Venous: 1.2 mmol/L (ref 0.5–1.9)

## 2016-10-17 LAB — PROCALCITONIN: Procalcitonin: 4.27 ng/mL

## 2016-10-17 LAB — MRSA PCR SCREENING: MRSA BY PCR: POSITIVE — AB

## 2016-10-17 MED ORDER — PIPERACILLIN-TAZOBACTAM 3.375 G IVPB
3.3750 g | Freq: Three times a day (TID) | INTRAVENOUS | Status: DC
  Administered 2016-10-17 – 2016-10-21 (×11): 3.375 g via INTRAVENOUS
  Filled 2016-10-17 (×13): qty 50

## 2016-10-17 MED ORDER — INSULIN DETEMIR 100 UNIT/ML ~~LOC~~ SOLN
20.0000 [IU] | Freq: Two times a day (BID) | SUBCUTANEOUS | Status: DC
  Administered 2016-10-17 – 2016-10-18 (×2): 20 [IU] via SUBCUTANEOUS
  Filled 2016-10-17 (×5): qty 0.2

## 2016-10-17 MED ORDER — DEXTROSE 5 % IV SOLN
5.0000 mg | Freq: Once | INTRAVENOUS | Status: AC
  Administered 2016-10-17: 5 mg via INTRAVENOUS
  Filled 2016-10-17: qty 0.5

## 2016-10-17 MED ORDER — MUPIROCIN 2 % EX OINT
1.0000 "application " | TOPICAL_OINTMENT | Freq: Two times a day (BID) | CUTANEOUS | Status: AC
  Administered 2016-10-17 – 2016-10-21 (×10): 1 via NASAL
  Filled 2016-10-17 (×2): qty 22

## 2016-10-17 MED ORDER — CHLORHEXIDINE GLUCONATE CLOTH 2 % EX PADS
6.0000 | MEDICATED_PAD | Freq: Every day | CUTANEOUS | Status: AC
  Administered 2016-10-18 – 2016-10-22 (×5): 6 via TOPICAL

## 2016-10-17 MED ORDER — SODIUM CHLORIDE 0.9 % IV BOLUS (SEPSIS)
250.0000 mL | Freq: Once | INTRAVENOUS | Status: AC
  Administered 2016-10-17: 250 mL via INTRAVENOUS

## 2016-10-17 MED ORDER — CARVEDILOL 3.125 MG PO TABS: 3.1250 mg | ORAL_TABLET | Freq: Two times a day (BID) | ORAL | Status: DC

## 2016-10-17 MED ORDER — SODIUM CHLORIDE 0.9 % IV SOLN: Freq: Once | INTRAVENOUS | Status: DC

## 2016-10-17 MED ORDER — SODIUM CHLORIDE 0.9 % IV BOLUS (SEPSIS)
500.0000 mL | Freq: Once | INTRAVENOUS | Status: AC
  Administered 2016-10-17: 500 mL via INTRAVENOUS

## 2016-10-17 MED ORDER — SODIUM CHLORIDE 0.9 % IV BOLUS (SEPSIS)
500.0000 mL | Freq: Once | INTRAVENOUS | Status: AC | PRN
  Administered 2016-10-17: 500 mL via INTRAVENOUS

## 2016-10-17 MED ORDER — SODIUM CHLORIDE 0.9 % IV SOLN
INTRAVENOUS | Status: AC
  Administered 2016-10-17 (×2): via INTRAVENOUS

## 2016-10-17 NOTE — Progress Notes (Signed)
Advanced Heart Failure Rounding Note  PCP: Dr. Inda Merlin Primary Cardiologist: Dr. Caryl Comes   Subjective:    Admitted on 10/13/16 with SOB, acute on chronic systolic CHF. Also with abdominal pain and nausea/vomiting. Surgery consult yesterday, has mild ileus. No plans for surgical intervention. Had BM last night.   Creatinine bumped 1.69->3.05 today. Arlyce Harman and [place] on hold.   EP saw and his device was reprogrammed to treat VT >130 bpm with ATP.   Obtunded on exam, will only arouse to sternal rub. Weight down 3 pounds, only had 235ml of urine out overnight.     Objective:   Weight Range: 191 lb 6.4 oz (86.8 kg) Body mass index is 28.26 kg/m.   Vital Signs:   Temp:  [98.1 F (36.7 C)-99.4 F (37.4 C)] 98.4 F (36.9 C) (02/23 0525) Pulse Rate:  [95-102] 95 (02/23 0525) Resp:  [16-18] 18 (02/23 0525) BP: (93-135)/(46-75) 95/53 (02/23 0525) SpO2:  [94 %-98 %] 95 % (02/23 0525) Weight:  [191 lb 6.4 oz (86.8 kg)] 191 lb 6.4 oz (86.8 kg) (02/23 0525) Last BM Date: 10/15/16  Weight change: Filed Weights   10/15/16 0428 10/16/16 0540 10/17/16 0525  Weight: 204 lb 14.4 oz (92.9 kg) 194 lb 9.6 oz (88.3 kg) 191 lb 6.4 oz (86.8 kg)    Intake/Output:   Intake/Output Summary (Last 24 hours) at 10/17/16 0745 Last data filed at 10/17/16 0533  Gross per 24 hour  Intake              180 ml  Output              800 ml  Net             -620 ml     Physical Exam: General:  Ill appearing, obtunded.   HEENT: normal Neck: supple. No JVP . Carotids 2+ bilat; no bruits. No lymphadenopathy or thyromegaly appreciated. Cor: PMI nondisplaced. Regular rate & rhythm. No rubs, gallops or murmurs. Lungs: Rhonchi in upper lobes. Diminshed in bases.  Abdomen: soft, diffusely tender, nondistended. No hepatosplenomegaly. No bruits or masses. Good bowel sounds. Extremities: no cyanosis, clubbing, rash. Bilateral BKA. No thigh edema Neuro: obtunded, arouses to sternal rub.   Telemetry: V paced,  RBBB  Labs: CBC  Recent Labs  10/15/16 0438 10/16/16 0814  WBC 7.5 17.1*  HGB 12.5* 14.0  HCT 38.5* 42.1  MCV 74.9* 75.3*  PLT 162 0000000   Basic Metabolic Panel  Recent Labs  10/15/16 1410 10/16/16 0413 10/17/16 0506  NA  --  144 145  K  --  3.5 3.7  CL  --  102 102  CO2  --  30 30  GLUCOSE  --  216* 201*  BUN  --  24* 50*  CREATININE  --  1.69* 3.05*  CALCIUM  --  9.1 8.8*  MG 2.0  --   --    BNP (last 3 results)  Recent Labs  12/28/15 0550 10/13/16 1401  BNP 380.3* 1,039.0*    Hemoglobin A1C  Recent Labs  10/15/16 0438  HGBA1C 11.0*    Medications:     Scheduled Medications: . aspirin EC  81 mg Oral Daily  . atorvastatin  10 mg Oral Daily  . carvedilol  12.5 mg Oral BID WC  . insulin aspart  0-9 Units Subcutaneous TID WC  . insulin detemir  30 Units Subcutaneous BID  . polyethylene glycol  17 g Oral Daily  . pregabalin  75 mg Oral QHS  .  senna-docusate  1 tablet Oral BID  . Warfarin - Pharmacist Dosing Inpatient   Does not apply q1800    PRN Medications: acetaminophen, guaiFENesin-dextromethorphan, ondansetron   Assessment/Plan   1. Acute on chronic combined diastolic and systolic CHF, NYHA class IIIb:  - Lasix on hold as he is getting IV hydration.  - Coreg ordered, but will be held this am due to hypotension. Delene Loll and Arlyce Harman on hold.  2. PAF, s/p CRT-D: Follows with Dr. Caryl Comes, last seen earlier this month.  He is in NSR today.  - Device was reprogrammed this admission to lower threshold on VT rate for therapy.  - This patients CHA2DS2-VASc Score and unadjusted Ischemic Stroke Rate (% per year) is equal to 7.2 % stroke rate/year from a score of 5 Above score calculated as 1 point each if present [CHF, HTN, DM, Vascular=MI/PAD/Aortic Plaque, Age if 65-74, or Male], 2 points each if present [Age > 75, or Stroke/TIA/TE] - Continue warfarin.  3. Abdominal pain: With vomiting. Continues to have abdominal pain. No leukocytosis, abdominal  CT without acute process.Primary team plans to repeat abdominal CT today.  4. CKD stage III:  - baseline 1.40-1.42.  - Creatinine elevated this am at 3.05. Nephrotoxic meds on hold.   5. Obesity - Counseled about sodium restricted diet.  6. Questionable sepsis: - Will order lactate level - hypotensive this am.    Length of Stay: [place], NP  10/17/2016, 7:45 AM  Advanced Heart Failure Team Pager [phone] (M-F; [place])  Please contact [facility] for night-coverage after hours (4p -7a ) and weekends on [email or web address]  Patient seen with NP, agree with the above note.  Lethargic this am, SBP in 90s, creatinine to 3, WBCs to 17, still with abdominal tenderness.  I am concerned for sepsis from abdominal source.  Holding Entresto, spironolactone, Coreg, Lasix.  Send lactate.  Would favor empiric antibiotics and repeat abdominal imaging.  Will give gentle IV fluid for developing septic shock.   Loralie Champagne 10/17/2016 8:08 AM

## 2016-10-17 NOTE — Progress Notes (Signed)
[facility] Progress Note    Subjective: Patient very sleepy from being given Phenergan at about 11:30 PM.    Objective: Vital signs in last 24 hours: Temp:  [98.1 F (36.7 C)-98.7 F (37.1 C)] 98.7 F (37.1 C) (02/23 0922) Pulse Rate:  [93-102] 93 (02/23 0922) Resp:  [16-18] 18 (02/23 0922) BP: (93-117)/(46-60) 100/57 (02/23 0922) SpO2:  [94 %-98 %] 94 % (02/23 0922) Weight:  [86.8 kg (191 lb 6.4 oz)] 86.8 kg (191 lb 6.4 oz) (02/23 0525) Last BM Date: 10/15/16  Intake/Output from previous day: 02/22 0701 - 02/23 0700 In: 180 [P.O.:180] Out: 800 [Urine:800] Intake/Output this shift: No intake/output data recorded.  General: NO distress.  Somnolent.  Lungs: Clear to auscultation.  Abd: moderately distended, has bowel sounds.  Seems to be tender on the right side, middle of the abdomen, soft reducible umbilical hernia  Extremities: No changes, bilateral amputee  Neuro: Somnolent  Lab Results:  @LABLAST2 (wbc:2,hgb:2,hct:2,plt:2) BMET ) Recent Labs  10/16/16 0413 10/17/16 0506  NA 144 145  K 3.5 3.7  CL 102 102  CO2 30 30  GLUCOSE 216* 201*  BUN 24* 50*  CREATININE 1.69* 3.05*  CALCIUM 9.1 8.8*   PT/INR  Recent Labs  10/16/16 0413 10/17/16 0506  LABPROT 19.2* 26.4*  INR 1.60 2.38   ABG No results for input(s): PHART, HCO3 in the last 72 hours.  Invalid input(s): PCO2, PO2  Studies/Results: Dg Abd 1 View  Result Date: 10/16/2016 CLINICAL DATA:  Nausea. EXAM: ABDOMEN - 1 VIEW COMPARISON:  10/13/2016 . FINDINGS: Soft tissue structures are unremarkable. Distended loops of small and large bowel noted. Findings consist with adynamic ileus. Mild gastric distention also noted. Degenerative changes lumbar spine. Left hip replacement. IMPRESSION: Distended loops of small and large bowel noted most consistent with adynamic ileus. Mild gastric distention also noted. Follow-up abdominal series suggested to exclude developing small bowel obstruction. Electronically  Signed   By: Marcello Moores  Register   On: 10/16/2016 16:06    Anti-infectives: Anti-infectives    Start     Dose/Rate Route Frequency Ordered Stop   10/17/16 0900  piperacillin-tazobactam (ZOSYN) IVPB 3.375 g     3.375 g 12.5 mL/hr over 240 Minutes Intravenous Every 8 hours 10/17/16 0816        Assessment/Plan: s/p  Awaiting CT results.    Rapid response nurse is here to see the patient now for  Mental status changes and will be getting ABG.  Ct delayed because of mental status changes.  LOS: 4 days   Kathryne Eriksson. Dahlia Bailiff, MD, FACS [phone] [facility] 10/17/2016

## 2016-10-17 NOTE — Progress Notes (Signed)
PT Cancellation Note  Patient Details Name: [name] [name] MRN: [id number] DOB: 1946/07/22   Cancelled Treatment:    Reason Eval/Treat Not Completed: Medical issues which prohibited therapy.  Pt transferred to 60M due to declining status.  Will check on tomorrow and see as able. 10/17/2016  Donnella Sham, [place] [phone]  (pager)  [name] 10/17/2016, 11:39 AM

## 2016-10-17 NOTE — Progress Notes (Signed)
Pt BP remains soft, 86/51, despite third 500cc bolus of NS.  Dr. Broadus John notified.

## 2016-10-17 NOTE — Care Management Important Message (Signed)
Important Message  Patient Details  Name: [name] [name] MRN: [id number] Date of Birth: 1946/05/13   Medicare Important Message Given:  Yes    Nathen May 10/17/2016, 2:05 PM

## 2016-10-17 NOTE — Progress Notes (Signed)
ANTICOAGULATION CONSULT NOTE - Follow Up Consult  Pharmacy Consult for warfarin Indication: atrial fibrillation  No Known Allergies  Patient Measurements: Height: 5\' 9"  (175.3 cm) Weight: 191 lb 6.4 oz (86.8 kg) IBW/kg (Calculated) : 70.7  Vital Signs: Temp: 98.4 F (36.9 C) (02/23 0525) Temp Source: Oral (02/23 0525) BP: 95/53 (02/23 0525) Pulse Rate: 95 (02/23 0525)  Labs:  Recent Labs  10/15/16 0438 10/16/16 0413 10/16/16 0814 10/17/16 0506  HGB 12.5*  --  14.0  --   HCT 38.5*  --  42.1  --   PLT 162  --  160  --   LABPROT 21.8* 19.2*  --  26.4*  INR 1.87 1.60  --  2.38  CREATININE 1.47* 1.69*  --  3.05*    Estimated Creatinine Clearance: 23.5 mL/min (by C-G formula based on SCr of 3.05 mg/dL (H)).  Assessment: 34 yom with history of PAF on warfarin PTA. Pharmacy consulted to dose inpatient.   INR up this am to 2.4 after dose was held 2/21 due to nausea/vomiting. Patient very obtunded this morning with no phenergan or sedating meds given since 2/21. Will hold warfarin tonight given bump in INR.  PTA warfarin dose: 10mg  daily except 15mg  on Wed/Sat (last dose 2/18)  Concern for early abdominal sepsis. No fevers overnight but wbc count up yesterday to 17, pending this am. Will start empiric zosyn. Scr is also up to 3, no dose adjustments needed at this time, but will need to follow closely.   Goal of Therapy:  INR 2-3 Monitor platelets by anticoagulation protocol: Yes   Plan:  Hold warfarin tonight Daily INR for now Zosyn 3.375g IV q8 hours  Erin Hearing PharmD., BCPS Clinical Pharmacist Pager [phone] 10/17/2016 7:49 AM

## 2016-10-17 NOTE — Progress Notes (Signed)
Patient ID: [name] [name], male   DOB: Dec 10, 1945, 71 y.o.   MRN: [id number]    PROGRESS NOTE  [name] [name]  [id number] DOB: [birth year] DOA: 10/13/2016  PCP: [name], MD   Brief Narrative:  71 y.o. male with medical history significant of NICM status post ICD, CHF with EF to 20-25% in 2014, DM-2, CKD, PAF on coumadin, bilateral BKA who presented to the ED with c/o left sided throbbing chest pain and SOB during that last two weeks and abd pain. Patient has been seen by his cardiologist and Lasix was increased from 40 to 80 mg twice a day, but despite this measure he has worsening orthopnea and during the last two weeks sleeps on two pillows instead of one as before.  Assessment & Plan:   Abd pain/tenderness/leukocytosis, encephalopathy -CT abd on admission unremarkable, has a small umbilical hernia containing fat without complication -continues to have a tender abdomen and more obtunded now, developing sepsis-likely -BP soft now and craetinine trended up -CCS consulting, suspect sepsis from abd source, will add IV Zosyn, repeat CTabd without IV contrast -IVF and bolus now -transfer to SDU  Acute on chronic exacerbation of systolic CHF - was on lasix 80 mg IV BID, now held - diuresed well, 8.1L negative - ECHo with EF of 20-25% and grade 2 diastolic dysfunction - appreciate CHF team for input, he is on Entresto at home,-held entresto and Aldactone due to AKI now - continue Coreg-lowered dose   AKI on CKD - stage III, -creat around 1.47 at baseline -now up to 3 in setting of sepsis/hypotension -add IVF, stopped aldactone and entresto  Hypertension, essential  - BP low now, see med changes above  URinary retention - also confirmed on CT abd - now with foley, voiding trial prior to discharge, when abd issues better - UA on admission not suggestive of UTI  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  Bilateral BKA - ambulates with prosthesis  DVT prophylaxis: on Coumadin -held Code Status: Full  Family Communication:none at bedside, d/w wife and daughter 2/22 Disposition Plan: transfer to SDU, not stable for DC plans  Consultants:   CHF team  Procedures:   None  Antimicrobials:   None  Subjective: Obtunded, responds to verbal stimuli, continues to have abd pain  Objective: Vitals:   10/16/16 1143 10/16/16 2033 10/16/16 2327 10/17/16 0525  BP: 117/60 (!) 93/46 (!) 97/52 (!) 95/53  Pulse: (!) 102 95 95 95  Resp: 18 18 16 18   Temp: 98.1 F (36.7 C) 98.6 F (37 C)  98.4 F (36.9 C)  TempSrc: Oral Oral  Oral  SpO2: 98% 95% 94% 95%  Weight:    86.8 kg (191 lb 6.4 oz)  Height:        Intake/Output Summary (Last 24 hours) at 10/17/16 0858 Last data filed at 10/17/16 0533  Gross per 24 hour  Intake              180 ml  Output              800 ml  Net             -620 ml   [employer]  10/15/16 0428 10/16/16 0540 10/17/16 0525  Weight: 92.9 kg (204 lb 14.4 oz) 88.3 kg (194 lb 9.6 oz) 86.8 kg (191 lb 6.4 oz)   Examination:  General exam: obtunded, arousable to verbal and painful stimuli Respiratory system: diminished breath sounds at bases with crackles  Cardiovascular system: S1 & S2 heard, RRR. B BKA Gastrointestinal system: Abdomen is distended, soft and tender in RLQ and LLQ and peri-umb area, umbilical hernia noted-reducible, decreased bowel sounds Central nervous system:obtunded. No focal neurological deficits. Psychiatry: unable to assess Ext: B/o BKA  Data Reviewed: I have personally reviewed following labs and imaging studies  CBC:  Recent Labs Lab 10/13/16 1213 10/13/16 1239 10/15/16 0438 10/16/16 0814 10/17/16 0728  WBC 4.4  --  7.5  17.1* 17.1*  NEUTROABS 3.4  --   --   --   --   HGB 11.3* 12.6* 12.5* 14.0 13.2  HCT 35.0* 37.0* 38.5* 42.1 40.0  MCV 74.9*  --  74.9* 75.3* 75.3*  PLT 155  --  162 160 0000000*   Basic Metabolic Panel:  Recent Labs Lab 10/13/16 1213 10/13/16 1239 10/13/16 1749 10/14/16 0402 10/15/16 0438 10/15/16 1410 10/16/16 0413 10/17/16 0506  NA 144 145  --  141 140  --  144 145  K 4.0 4.1  --  3.5 3.5  --  3.5 3.7  CL 113* 111  --  108 101  --  102 102  CO2 21*  --   --  25 25  --  30 30  GLUCOSE 135* 134*  --  328* 240*  --  216* 201*  BUN 29* 32*  --  27* 22*  --  24* 50*  CREATININE 1.60* 1.60*  --  1.47* 1.47*  --  1.69* 3.05*  CALCIUM 8.6*  --   --  8.4* 8.7*  --  9.1 8.8*  MG  --   --  1.9  --   --  2.0  --   --    \Liver Function Tests:  Recent Labs Lab 10/13/16 1213 10/14/16 0402  AST 26 17  ALT 53 39  ALKPHOS 71 58  BILITOT 0.4 0.5  PROT 6.6 5.7*  ALBUMIN 3.4* 3.0*   Coagulation Profile:  Recent Labs Lab 10/13/16 1343 10/14/16 0402 10/15/16 0438 10/16/16 0413 10/17/16 0506  INR 3.48 3.59 1.87 1.60 2.38   CBG:  Recent Labs Lab 10/16/16 0750 10/16/16 1124 10/16/16 1645 10/16/16 2133 10/17/16 0731  GLUCAP 241* 247* 228* 290* 167*   Urine analysis:    Component Value Date/Time   COLORURINE STRAW (A) 10/13/2016 1428   APPEARANCEUR CLEAR 10/13/2016 1428   LABSPEC 1.010 10/13/2016 1428   PHURINE 5.0 10/13/2016 1428   GLUCOSEU 50 (A) 10/13/2016 1428   HGBUR MODERATE (A) 10/13/2016 1428   BILIRUBINUR NEGATIVE 10/13/2016 1428   KETONESUR NEGATIVE 10/13/2016 1428   PROTEINUR 30 (A) 10/13/2016 1428   UROBILINOGEN 0.2 01/11/2015 1850   NITRITE NEGATIVE 10/13/2016 1428   LEUKOCYTESUR NEGATIVE 10/13/2016 1428   Radiology Studies: Ct Abdomen Pelvis Wo Contrast Result Date: 10/13/2016 1. Small bilateral pleural effusion. Bilateral lower lobe posterior atelectasis or infiltrate.  2. No nephrolithiasis. No hydronephrosis or hydroureter. Nonspecific mild  bilateral perinephric stranding. No calcified ureteral calculi.  3. There is moderate distended urinary bladder without urinary bladder filling defects. No calcified calculi. Please note the urinary bladder measures at least 17 cm in length cranial caudally.  4. Normal appendix.  No pericecal inflammation.  5. No small bowel or colonic  obstruction. Few diverticula are noted descending colon proximal sigmoid colon. No definite evidence of acute colitis or diverticulitis.  6. Limited evaluation of the pelvis due to metallic artifacts from left hip prosthesis.  7. There is disc space flattening with mild vacuum disc phenomenon and mild posterior disc bulge at L2-L3 level.  8. Mild anasarca infiltration of subcutaneous fat abdominal and pelvic wall.  9. There is a umbilical hernia containing omental fat best seen in sagittal image 93 measures 2 cm without evidence of acute complication. Mild skin thickening and subcutaneous stranding in lower anterior abdominal wall axial image 56 probable mild dermatitis or subcutaneous edema.   Dg Chest 2 View Result Date: 10/13/2016 Re- demonstration of heart failure with interstitial edema and effusions.   Scheduled Meds: . atorvastatin  10 mg Oral Daily  . insulin aspart  0-9 Units Subcutaneous TID WC  . insulin detemir  20 Units Subcutaneous BID  . piperacillin-tazobactam (ZOSYN)  IV  3.375 g Intravenous Q8H  . polyethylene glycol  17 g Oral Daily  . pregabalin  75 mg Oral QHS  . senna-docusate  1 tablet Oral BID  . Warfarin - Pharmacist Dosing Inpatient   Does not apply q1800   Continuous Infusions: . sodium chloride       LOS: 4 days   Time spent:30 minutes   Domenic Polite, MD [facility] Pager [phone]  If 7PM-7AM, please contact night-coverage [email or web address] Password [facility] 10/17/2016, 8:58 AM

## 2016-10-17 NOTE — Progress Notes (Signed)
RT attempted to obtain ABG without success. RT called charge RT to have someone else to come obtain ABG from patient.

## 2016-10-17 NOTE — Consult Note (Addendum)
PULMONARY / CRITICAL CARE MEDICINE   Name: [name] [name] MRN: [id number] DOB: 29-May-1946    ADMISSION DATE:  10/13/2016 CONSULTATION DATE:  10/17/16  REFERRING MD:  Fanny Bien MD  CHIEF COMPLAINT:  Sepsis, hypotension  HISTORY OF PRESENT ILLNESS:   71 year old with history of nonischemic cardiomyopathy, ICD, EF 20-25%, diabetes, CAD, PAF on Coumadin, bilateral BKA. Admitted on 2/19 with chest pain, dyspnea, abdominal pain, nausea, vomiting. He had a CT abdomen pelvis which was unremarkable except for small apical hernia. Surgery was consulted but no OR interventions recommended. He was also treated for CHF exacerbation with Lasix and diuresed well with -8.1 L I/O.   He had a rapid response on 2/13 with worsening mental status, hypotension. Acute increase in creatinine to 3.05. Transferred to ICU, given 1.5 L fluids and CT abdomen repeated. PCCM consulted for help with management.  PAST MEDICAL HISTORY :  He  has a past medical history of Anemia; Arthritis; Automatic implantable cardioverter-defibrillator in situ; CAD (coronary artery disease); Cardiomyopathy, nonischemic ([place]); Chronic systolic CHF (congestive heart failure) ([place]); CKD (chronic kidney disease), stage III; Complication of anesthesia; Constipation; Dementia; GERD (gastroesophageal reflux disease); Headache; High cholesterol; History of blood transfusion; History of bronchitis; History of kidney stones; Hypertension; LBBB (left bundle branch block); NSVT (nonsustained ventricular tachycardia) ([facility]); PAD (peripheral artery disease) ([place]); PAF (paroxysmal atrial fibrillation) ([place]); PAF (paroxysmal atrial fibrillation) ([place]); Peripheral neuropathy ([place]); Pneumonia; PONV (postoperative nausea and vomiting); Type II diabetes mellitus ([place]); Urinary frequency; and Urinary urgency.  PAST SURGICAL HISTORY: He  has a past surgical history that includes Lithotripsy (2001); Cervical spine surgery (1994); Cardiac defibrillator placement  (06/2009); US ECHOCARDIOGRAPHY (03/21/2008); Cardiovascular stress test (03/20/2009); Transluminal atherectomy tibial artery (Left, 09/12/2013); Toe amputation (10/04/2013); Amputation (Left, 10/04/2013); Colonoscopy; Leg amputation below knee (Left, 11/09/2013); Amputation (Left, 11/09/2013); Amputation (Right, 01/04/2014); Amputation (Right, 01/30/2014); lower extremity angiogram (Bilateral, 09/12/2013); abdominal angiogram (09/12/2013); lower extremity angiogram (N/A, 12/29/2013); atherectomy (Right, 12/29/2013); Cardiac catheterization (10/01/2001); Esophagogastroduodenoscopy; Stump revision (Left, 01/03/2015); and Hip Arthroplasty (Left, 12/28/2015).  No Known Allergies  No current facility-administered medications on file prior to encounter.    Current Outpatient Prescriptions on File Prior to Encounter  Medication Sig  . acetaminophen (TYLENOL) 325 MG tablet Take 2 tablets (650 mg total) by mouth every 6 (six) hours as needed for mild pain (or Fever >/= 101).  [name] aspirin EC 81 MG tablet Take 81 mg by mouth daily.  [name] atorvastatin (LIPITOR) 10 MG tablet Take 1 tablet (10 mg total) by mouth daily.  . carvedilol (COREG) 12.5 MG tablet Take 1 tablet (12.5 mg total) by mouth 2 (two) times daily with a meal.  . Cholecalciferol (VITAMIN D3) 10000 units TABS Take 1 tablet by mouth daily.  . furosemide (LASIX) 40 MG tablet Take 1 tablet (40 mg total) by mouth daily. (Patient taking differently: Take 40-80 mg by mouth daily. Monday,wednesday,friday, Saturday ( 80 mg 4  Days a week ) all other days 40 mg)  . insulin detemir (LEVEMIR) 100 UNIT/ML injection 25 unit AM and 35 unit PM (Patient taking differently: Inject 25-35 Units into the skin See admin instructions. 25 unit AM and 35 unit PM)  . iron polysaccharides (NIFEREX) 150 MG capsule Take 1 capsule (150 mg total) by mouth daily.  . Multiple Vitamin (MULTIVITAMIN WITH MINERALS) TABS tablet Take 1 tablet by mouth daily.  [name] omeprazole (PRILOSEC) 20 MG capsule Take 20 mg  by mouth daily.  . pregabalin (LYRICA) 75 MG capsule Take 1 capsule (75 mg total)  by mouth 2 (two) times daily.  . sacubitril-valsartan (ENTRESTO) 24-26 MG Take 1 tablet by mouth 2 (two) times daily.  [name] senna-docusate (SENOKOT-S) 8.6-50 MG per tablet Take 1 tablet by mouth 2 (two) times daily.  [name] warfarin (COUMADIN) 10 MG tablet TAKE AS DIRECTED BY COUMADIN CLINIC (Patient not taking: Reported on 10/13/2016)    FAMILY HISTORY:  His indicated that his mother is deceased. He indicated that his father is deceased.    SOCIAL HISTORY: He  reports that he has never smoked. He has never used smokeless tobacco. He reports that he does not drink alcohol or use drugs.  REVIEW OF SYSTEMS:   Unable to obtain as patient is altered  SUBJECTIVE:   VITAL SIGNS: BP (!) 86/51   Pulse 78   Temp 98.9 F (37.2 C) (Oral)   Resp 20   Ht 5\' 9"  (1.753 m)   Wt 191 lb 6.4 oz (86.8 kg)   SpO2 97%   BMI 28.26 kg/m   HEMODYNAMICS:    VENTILATOR SETTINGS:    INTAKE / OUTPUT: I/O last 3 completed shifts: In: 360 [P.O.:360] Out: 1400 [Urine:1400]  PHYSICAL EXAMINATION: General:  Somnolent but arousable, in no distress Neuro:  No focal deficits HEENT:  PERRLA, no thyromegaly, JVD Cardiovascular:  Regular rate and rhythm, no murmurs rubs gallops Lungs: Clear, no wheeze, crackles Abdomen:  Distended, mild generalized tenderness, no guarding, rigidity Musculoskeletal:  Intact tone and bulk Skin:  Intact, no rash   LABS:  BMET  Recent Labs Lab 10/15/16 0438 10/16/16 0413 10/17/16 0506  NA 140 144 145  K 3.5 3.5 3.7  CL 101 102 102  CO2 25 30 30   BUN 22* 24* 50*  CREATININE 1.47* 1.69* 3.05*  GLUCOSE 240* 216* 201*    Electrolytes  Recent Labs Lab 10/13/16 1749  10/15/16 0438 10/15/16 1410 10/16/16 0413 10/17/16 0506  CALCIUM  --   < > 8.7*  --  9.1 8.8*  MG 1.9  --   --  2.0  --   --   < > = values in this interval not displayed.  CBC  Recent Labs Lab 10/15/16 0438  10/16/16 0814 10/17/16 0728  WBC 7.5 17.1* 17.1*  HGB 12.5* 14.0 13.2  HCT 38.5* 42.1 40.0  PLT 162 160 137*    Coag's  Recent Labs Lab 10/15/16 0438 10/16/16 0413 10/17/16 0506  INR 1.87 1.60 2.38    Sepsis Markers  Recent Labs Lab 10/13/16 1519 10/17/16 0851  LATICACIDVEN 1.31 1.5    ABG  Recent Labs Lab 10/17/16 0940  PHART 7.465*  PCO2ART 44.5  PO2ART 58.3*    Liver Enzymes  Recent Labs Lab 10/13/16 1213 10/14/16 0402  AST 26 17  ALT 53 39  ALKPHOS 71 58  BILITOT 0.4 0.5  ALBUMIN 3.4* 3.0*    Cardiac Enzymes No results for input(s): TROPONINI, PROBNP in the last 168 hours.  Glucose  Recent Labs Lab 10/16/16 1124 10/16/16 1645 10/16/16 2133 10/17/16 0731 10/17/16 0943 10/17/16 1121  GLUCAP 247* 228* 290* 167* 129* 137*    STUDIES:  ABG 2/23- 7.46/44/58/91% CT abd 10/13/16- small bilateral effusions, mild anasarca abdominal wall, apical hernia. CT abd 10/17/16- mesenteric thickening is adjacent to ascending colon, mid descending colon. Suspicion for colitis, proctitis. Distended gallbladder  CXR 10/17/16- Mild CHF, small effusions I reviewed all images personally.  CULTURES: None  ANTIBIOTICS: Zosyn 2/23 >  SIGNIFICANT EVENTS: 2/19- Admit 2/23- Transfer to SDU after rapid response  LINES/TUBES:   DISCUSSION:  71 year old with admission for nausea, vomiting, abdominal pain, CHF. CT scan is showing colitis, distended gall bladder. Likely has sepsis from abd source.  Now the ICU for hypotension and altered mental status. He may have been over diuresed as his creatinine has bumped to 3.05 He is responding to gentle fluids. Normal lactic acid from AM is reassuring but no recent results. Recommend continuing gentle hydration and antibiotics. We will check RUQ U/S and repeat LFTs. Continue to monitor in SDU for any worsening in which case he can be transferred to the ICU. He remains full code per discussion with his  wife  ASSESSMENT / PLAN:  PULMONARY A: Resp distress Mild volume overload P:   Supplemental O2 as tolerated  CARDIOVASCULAR A:  Nonischemic cardiomyopathy CHF P:  Lasix on hold Holding entresoto coreg.  Heart failure team on board Repeat LA  RENAL A:   AKI, likely prerenal.  P:   Monitor urine output and creatinine  GASTROINTESTINAL A:   Colitis R/O cholecystitis P:   Surgery on board Keep NPO Recheck LFTs RUQ ultrasound  HEMATOLOGIC A:   Leukocytosis Thrombocytopenia P:  Monitor CBC  INFECTIOUS A:   Sepsis, suspected intraabd source P:   Started zosyn Follow cultures. Pct  ENDOCRINE A:   DM P:   SSI coverage.  NEUROLOGIC A:   Altered mental status Likely metabolic, septic encephalopahty P:   Avoid sedating meds  FAMILY  - Updates: Wife updated at [place] family meet or Palliative Care meeting due by: 1/2  Critical care time- 35 mins  Marshell Garfinkel MD [facility] Pager [phone] If no answer or after 3pm call: [phone] 10/17/2016, 1:53 PM

## 2016-10-17 NOTE — Care Management Note (Addendum)
Case Management Note  Patient Details  Name: KATSUMI PAULOVICH MRN: [id number] Date of Birth: 09-22-1945  Subjective/Objective:    Pt originally admitted for CHF excerbation to the floor - required rapid response and transferred to SD              Action/Plan:   Pt is alert during assessment however not oriented.  CM talked with wife via phone - pt has wavering baseline cognition, is walker/wheelchair bound in the home, bilateral BKA for approximately 5 years, not receiving  home health PTA and was in a SNF twice last year.  Wife states she can not care for pt in current condition and request CIR or SNF placement.  CM explained to wife the process post PT evaluation.  CM will request evaluation and will continue to follow    Expected Discharge Date:                  Expected Discharge Plan:     In-House Referral:     Discharge planning Services  CM Consult  Post Acute Care Choice:    Choice offered to:     DME Arranged:    DME Agency:     HH Arranged:    [place] Agency:     Status of Service:  In process, will continue to follow  If discussed at Long Length of Stay Meetings, dates discussed:    Additional Comments:  Maryclare Labrador, RN 10/17/2016, 2:09 PM

## 2016-10-17 NOTE — Progress Notes (Signed)
Results for LENYN, WEEBER (MRN [id number]) as of 10/17/2016 07:24  Ref. Range 10/15/2016 20:52 10/16/2016 07:50 10/16/2016 11:24 10/16/2016 16:45 10/16/2016 21:33  Glucose-Capillary Latest Ref Range: 65 - 99 mg/dL 217 (H) 241 (H) 247 (H) 228 (H) 290 (H)  Noted that blood sugars continue to be elevated greater than 180 mg/dl. Recommend increasing Novolog correction scale to MODERATE TID & HS if blood sugars continue to be elevated. Harvel Ricks RN BSN CDE

## 2016-10-17 NOTE — Progress Notes (Signed)
The most remarkable finding on the CT from surgical standpoint is his markedly distended gallbladder.  If no other sources of sepsis are found then percutaneous drainage of the GB may be considered after his INR has been corrected.  Kathryne Eriksson. Dahlia Bailiff, MD, [place] [phone] [facility]

## 2016-10-17 NOTE — Progress Notes (Signed)
Pt to floor, lethargic, responds to repeated stimuli, VSS at present, Vit K infusing per orders, will continue to monitor closely.  Edward Qualia RN

## 2016-10-17 NOTE — Progress Notes (Addendum)
When attempting to give AM meds, pt responding to pain only.  BP 100/57, HR 93.  RRT contacted, with Hella, RRT, responding to unit.  Dr. Broadus John called, as well.   Dr. Broadus John stated that she had already assessed the pt this am prior to my call, and was aware of pt's detiorating condition,and that she had ordered a CT abdomen/pelvis, and NS 113mL continuous.  Fluids started.  Dr. Broadus John placed orders to transfer to ICU.  Hella, RN RRT suctioned pt, and started him on O2 3L [place]. 0946 BP 87/49, HR 57.  NS 567mL x2 bolus was given.   0955 BP 97/46, HR 58.  ABGs drawn; portable CXR performed; waiting results. 1008 BP 93/41, HR 88. Pt transferred to CT for CT of abdomen/pelvis, then returned to [facility].  Hella RN, RRT and Sharetta, NT, accompanied pt. 1048 BP 88/44, HR 54.  Report called to receiving nurse; pt transferred to 2M04 with Hella RN RRT and Wonda Amis, NT in accompaniment.  Held meds, chart, transferred with pt.

## 2016-10-17 NOTE — Significant Event (Signed)
Rapid Response Event Note  Overview: Time Called: 0919 Arrival Time: 0925 Event Type: Neurologic  Initial Focused Assessment: Patient responsive to gag stimulation, otherwise obtunded. Moves bilat arms equally, bilat leg amputee Pupils 2-3 and reactive Lung sounds decreased right base Heart tones regular  BP 100/57  HR 93  RR 28  O2 sat 90% on RA  Family at bedside Dr Hulen Skains at bedside to assess patient Patient last received phenergan 36 hours ago    Interventions: ABG done Placed on 3L [place]  O2 sats 100% NS bolus infusing BP 87/49  HR 93  RR 28 PCXR done Additional 500 cc NS bolus given BP 97/46  HR 91  RR 24 Abdominal CT done Spoke with Dr Broadus John, orders received.  Transferred to 2M04 via bed with heart monitor and O2 RN at bedside to receive patient and aware family in waiting area  Plan of Care (if not transferred):  Event Summary: Name of Physician Notified: Broadus John at 0920  Name of Consulting Physician Notified: Dr Hulen Skains at bedside at    Outcome: Transferred (Comment) [phone])  Event End Time: 1110  Raliegh Ip

## 2016-10-18 ENCOUNTER — Inpatient Hospital Stay (HOSPITAL_COMMUNITY): Payer: Medicare Other

## 2016-10-18 ENCOUNTER — Encounter (HOSPITAL_COMMUNITY): Payer: Self-pay | Admitting: Interventional Radiology

## 2016-10-18 HISTORY — PX: IR GENERIC HISTORICAL: IMG1180011

## 2016-10-18 LAB — BASIC METABOLIC PANEL
ANION GAP: 9 (ref 5–15)
Anion gap: 10 (ref 5–15)
BUN: 56 mg/dL — AB (ref 6–20)
BUN: 56 mg/dL — ABNORMAL HIGH (ref 6–20)
CALCIUM: 8 mg/dL — AB (ref 8.9–10.3)
CO2: 28 mmol/L (ref 22–32)
CO2: 31 mmol/L (ref 22–32)
CREATININE: 2.56 mg/dL — AB (ref 0.61–1.24)
CREATININE: 2.14 mg/dL — AB (ref 0.61–1.24)
Calcium: 8.3 mg/dL — ABNORMAL LOW (ref 8.9–10.3)
Chloride: 110 mmol/L (ref 101–111)
Chloride: 110 mmol/L (ref 101–111)
GFR calc Af Amer: 27 mL/min — ABNORMAL LOW (ref >60)
GFR, EST AFRICAN AMERICAN: 34 mL/min — AB (ref >60)
GFR, EST NON AFRICAN AMERICAN: 23 mL/min — AB (ref >60)
GFR, EST NON AFRICAN AMERICAN: 29 mL/min — AB (ref >60)
GLUCOSE: 156 mg/dL — AB (ref 65–99)
Glucose, Bld: 147 mg/dL — ABNORMAL HIGH (ref 65–99)
Potassium: 3.6 mmol/L (ref 3.5–5.1)
Potassium: 3.5 mmol/L (ref 3.5–5.1)
SODIUM: 151 mmol/L — AB (ref 135–145)
Sodium: 147 mmol/L — ABNORMAL HIGH (ref 135–145)

## 2016-10-18 LAB — AMMONIA: AMMONIA: 26 umol/L (ref 9–35)

## 2016-10-18 LAB — GLUCOSE, CAPILLARY
GLUCOSE-CAPILLARY: 133 mg/dL — AB (ref 65–99)
GLUCOSE-CAPILLARY: 69 mg/dL (ref 65–99)
GLUCOSE-CAPILLARY: 48 mg/dL — AB (ref 65–99)
Glucose-Capillary: 115 mg/dL — ABNORMAL HIGH (ref 65–99)
Glucose-Capillary: 140 mg/dL — ABNORMAL HIGH (ref 65–99)

## 2016-10-18 LAB — CBC
HCT: 37.3 % — ABNORMAL LOW (ref 39.0–52.0)
HEMOGLOBIN: 12.1 g/dL — AB (ref 13.0–17.0)
MCH: 24.5 pg — ABNORMAL LOW (ref 26.0–34.0)
MCHC: 32.4 g/dL (ref 30.0–36.0)
MCV: 75.5 fL — ABNORMAL LOW (ref 78.0–100.0)
PLATELETS: 122 10*3/uL — AB (ref 150–400)
RBC: 4.94 MIL/uL (ref 4.22–5.81)
RDW: 16.3 % — AB (ref 11.5–15.5)
WBC: 15.6 10*3/uL — ABNORMAL HIGH (ref 4.0–10.5)

## 2016-10-18 LAB — PROTIME-INR
INR: 1.3
PROTHROMBIN TIME: 16.3 s — AB (ref 11.4–15.2)

## 2016-10-18 LAB — PROCALCITONIN: PROCALCITONIN: 2.8 ng/mL

## 2016-10-18 LAB — MAGNESIUM: Magnesium: 2.2 mg/dL (ref 1.7–2.4)

## 2016-10-18 LAB — LACTIC ACID, PLASMA: LACTIC ACID, VENOUS: 1.2 mmol/L (ref 0.5–1.9)

## 2016-10-18 MED ORDER — SODIUM CHLORIDE 0.45 % IV SOLN
INTRAVENOUS | Status: DC
  Administered 2016-10-18: 12:00:00 via INTRAVENOUS

## 2016-10-18 MED ORDER — MORPHINE SULFATE (PF) 2 MG/ML IV SOLN
2.0000 mg | INTRAVENOUS | Status: DC | PRN
  Administered 2016-10-19 (×2): 2 mg via INTRAVENOUS
  Filled 2016-10-18 (×2): qty 1

## 2016-10-18 MED ORDER — DEXTROSE-NACL 5-0.45 % IV SOLN
INTRAVENOUS | Status: DC
  Administered 2016-10-18: 23:00:00 via INTRAVENOUS

## 2016-10-18 MED ORDER — LIDOCAINE HCL (PF) 1 % IJ SOLN
INTRAMUSCULAR | Status: AC
  Filled 2016-10-18: qty 30

## 2016-10-18 MED ORDER — FENTANYL CITRATE (PF) 100 MCG/2ML IJ SOLN
INTRAMUSCULAR | Status: AC | PRN
  Administered 2016-10-18: 25 ug via INTRAVENOUS

## 2016-10-18 MED ORDER — DEXTROSE 50 % IV SOLN
INTRAVENOUS | Status: AC
  Administered 2016-10-18: 50 mL
  Filled 2016-10-18: qty 50

## 2016-10-18 MED ORDER — FENTANYL CITRATE (PF) 100 MCG/2ML IJ SOLN
INTRAMUSCULAR | Status: AC
  Filled 2016-10-18: qty 2

## 2016-10-18 MED ORDER — MIDAZOLAM HCL 2 MG/2ML IJ SOLN
INTRAMUSCULAR | Status: AC
  Filled 2016-10-18: qty 2

## 2016-10-18 MED ORDER — IOPAMIDOL (ISOVUE-300) INJECTION 61%
INTRAVENOUS | Status: AC
  Administered 2016-10-18: 5 mL
  Filled 2016-10-18: qty 50

## 2016-10-18 MED ORDER — LIDOCAINE HCL 1 % IJ SOLN
INTRAMUSCULAR | Status: AC | PRN
  Administered 2016-10-18: 5 mL

## 2016-10-18 MED ORDER — INSULIN DETEMIR 100 UNIT/ML ~~LOC~~ SOLN
10.0000 [IU] | Freq: Two times a day (BID) | SUBCUTANEOUS | Status: DC
  Administered 2016-10-18 – 2016-10-20 (×5): 10 [IU] via SUBCUTANEOUS
  Filled 2016-10-18 (×6): qty 0.1

## 2016-10-18 MED ORDER — MIDAZOLAM HCL 2 MG/2ML IJ SOLN
INTRAMUSCULAR | Status: AC | PRN
  Administered 2016-10-18: 0.5 mg via INTRAVENOUS

## 2016-10-18 NOTE — Progress Notes (Signed)
PT Cancellation Note  Patient Details Name: ERYCK COEN MRN: [id number] DOB: [birth year]   Cancelled Treatment:    Reason Eval/Treat Not Completed: Patient at procedure or test/unavailable. Pt in radiology for drain placement. Will follow up tomorrow.   Shary Decamp [place] 10/18/2016, 2:00 PM [place]

## 2016-10-18 NOTE — Progress Notes (Signed)
Patient ID: [name] [name], male   DOB: 04-24-1946, 71 y.o.   MRN: [id number]    PROGRESS NOTE  [name] [name]  [id number] DOB: 14-Dec-1945 DOA: 10/13/2016  PCP: [name], MD   Brief Narrative:  71 y.o. male with medical history significant of NICM status post ICD, CHF with EF to 20-25% in 2014, DM-2, CKD, PAF on coumadin, bilateral BKA who presented to the ED with c/o left sided throbbing chest pain and SOB during that last two weeks and abd pain. Patient has been seen by his cardiologist and Lasix was increased from 40 to 80 mg twice a day, but despite this measure he has worsening orthopnea and during the last two weeks sleeps on two pillows instead of one as before.  Assessment & Plan:  Sepsis/Acute cholecystitis -finally RUQ [place] yesterday was suggestive of acute cholecystitis after 2 non diagnostic CT abdomens -CCS following -improving, BP more stable -continue IV Zosyn Day 2 -NPO, Perc GB drain requested per IR -INR down given FFP and Vitamin K last pm  Acute on chronic exacerbation of systolic CHF - was on lasix 80 mg IV BID, now held, 8.1L negative - ECHo with EF of 20-25% and grade 2 diastolic dysfunction - appreciate CHF team for input, - all diuretics and entresto held due to sepsis/hypotension  AKI on CKD - stage III, -creat around 1.47 at baseline -now up to 3 in setting of sepsis/hypotension, now 2.1 -continue IVF change to 1/2 NS, stopped aldactone and entresto  Hypertension, essential  - BP low now, see med changes above  URinary retention - also confirmed on CT abd - now with foley, voiding trial prior to discharge, when abd issues better - UA on admission not suggestive of UTI  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  Bilateral BKA - ambulates with prosthesis  DVT prophylaxis: on Coumadin -held Code Status: Full  Family Communication:none at bedside, d/w wife and daughter 2/22 Disposition Plan: Keep in  SDU, not stable for DC plans  Consultants:   CHF team  Procedures:   None  Antimicrobials:   None  Subjective: more alert, c/o abd pain Objective: Vitals:   10/18/16 0300 10/18/16 0425 10/18/16 0525 10/18/16 0802  BP: (!) 101/58 116/69 125/60 116/64  Pulse: 78 89 91 88  Resp: 20 17 12 16   Temp: 98.4 F (36.9 C) 98.3 F (36.8 C) 98.4 F (36.9 C) 97.8 F (36.6 C)  TempSrc: Oral Oral Oral Oral  SpO2: 94% 94% 92% 93%  Weight:  89 kg (196 lb 1.6 oz)    Height:        Intake/Output Summary (Last 24 hours) at 10/18/16 1046 Last data filed at 10/18/16 1005  Gross per 24 hour  Intake          3619.33 ml  Output              150 ml  Net          3469.33 ml   Filed Weights   10/16/16 0540 10/17/16 0525 10/18/16 0425  Weight: 88.3 kg (194 lb 9.6 oz) 86.8 kg (191 lb 6.4 oz) 89 kg (196 lb 1.6 oz)   Examination:  General exam: more alert and lucid Respiratory system: diminished breath sounds at bases with crackles  Cardiovascular system: S1 & S2 heard, RRR. B BKA Gastrointestinal system: Abdomen is distended, soft and tender in RUQ and peri-umb area, umbilical hernia noted-reducible, decreased bowel sounds Central nervous system more alert, . No focal neurological deficits. Psychiatry: unable to assess Ext: B/o BKA  Data Reviewed: I have personally reviewed following labs and imaging studies  CBC:  Recent Labs Lab 10/13/16 1213  10/15/16 0438 10/16/16 0814 10/17/16 0728 10/17/16 1409 10/18/16 0809  WBC 4.4  --  7.5 17.1* 17.1* 15.7* 15.6*  NEUTROABS 3.4  --   --   --   --  14.5*  --   HGB 11.3*  < > 12.5* 14.0 13.2 13.3 12.1*  HCT 35.0*  < > 38.5* 42.1 40.0 40.1 37.3*  MCV 74.9*  --  74.9* 75.3* 75.3* 75.2* 75.5*    PLT 155  --  162 160 137* 125* 122*  < > = values in this interval not displayed. Basic Metabolic Panel:  Recent Labs Lab 10/13/16 1749  10/15/16 1410 10/16/16 0413 10/17/16 0506 10/17/16 1409 10/17/16 2248 10/18/16 0809  NA  --   < >  --  144 145 148* 147* 151*  K  --   < >  --  3.5 3.7 3.6 3.6 3.5  CL  --   < >  --  102 102 106 110 110  CO2  --   < >  --  30 30 29 28 31   GLUCOSE  --   < >  --  216* 201* 132* 156* 147*  BUN  --   < >  --  24* 50* 56* 56* 56*  CREATININE  --   < >  --  1.69* 3.05* 3.05* 2.56* 2.14*  CALCIUM  --   < >  --  9.1 8.8* 8.3* 8.0* 8.3*  MG 1.9  --  2.0  --   --   --  2.2  --   < > = values in this interval not displayed. \Liver Function Tests:  Recent Labs Lab 10/13/16 1213 10/14/16 0402 10/17/16 1409  AST 26 17 21   ALT 53 39 26  ALKPHOS 71 58 52  BILITOT 0.4 0.5 0.8  PROT 6.6 5.7* 6.2*  ALBUMIN 3.4* 3.0* 2.5*   Coagulation Profile:  Recent Labs Lab 10/14/16 0402 10/15/16 0438 10/16/16 0413 10/17/16 0506 10/18/16 0809  INR 3.59 1.87 1.60 2.38 1.30   CBG:  Recent Labs Lab 10/17/16 1121 10/17/16 1524 10/17/16 1756 10/17/16 2028 10/18/16 0801  GLUCAP 137* 126* 136* 141* 133*   Urine analysis:    Component Value Date/Time   COLORURINE STRAW (A) 10/13/2016 1428   APPEARANCEUR CLEAR 10/13/2016 1428   LABSPEC 1.010 10/13/2016 1428   PHURINE 5.0 10/13/2016 1428   GLUCOSEU 50 (A) 10/13/2016 1428   HGBUR MODERATE (A) 10/13/2016 1428   BILIRUBINUR NEGATIVE 10/13/2016 1428   KETONESUR NEGATIVE 10/13/2016 1428   PROTEINUR 30 (A) 10/13/2016 1428   UROBILINOGEN 0.2 01/11/2015 1850   NITRITE NEGATIVE 10/13/2016 1428   LEUKOCYTESUR NEGATIVE 10/13/2016 1428   Radiology Studies: Ct Abdomen Pelvis Wo Contrast Result Date: 10/13/2016 1. Small bilateral pleural effusion. Bilateral lower lobe posterior atelectasis or infiltrate.  2. No nephrolithiasis. No hydronephrosis or hydroureter. Nonspecific mild bilateral perinephric  stranding. No calcified ureteral calculi.  3. There is moderate distended urinary bladder without urinary bladder filling defects. No calcified calculi. Please note the urinary bladder measures  at least 17 cm in length cranial caudally.  4. Normal appendix.  No pericecal inflammation.  5. No small bowel or colonic obstruction. Few diverticula are noted descending colon proximal sigmoid colon. No definite evidence of acute colitis or diverticulitis.  6. Limited evaluation of the pelvis due to metallic artifacts from left hip prosthesis.  7. There is disc space flattening with mild vacuum disc phenomenon and mild posterior disc bulge at L2-L3 level.  8. Mild anasarca infiltration of subcutaneous fat abdominal and pelvic wall.  9. There is a umbilical hernia containing omental fat best seen in sagittal image 93 measures 2 cm without evidence of acute complication. Mild skin thickening and subcutaneous stranding in lower anterior abdominal wall axial image 56 probable mild dermatitis or subcutaneous edema.   Dg Chest 2 View Result Date: 10/13/2016 Re- demonstration of heart failure with interstitial edema and effusions.   Scheduled Meds: . sodium chloride   Intravenous Once  . atorvastatin  10 mg Oral Daily  . Chlorhexidine Gluconate Cloth  6 each Topical Q0600  . insulin aspart  0-9 Units Subcutaneous TID WC  . insulin detemir  20 Units Subcutaneous BID  . mupirocin ointment  1 application Nasal BID  . piperacillin-tazobactam (ZOSYN)  IV  3.375 g Intravenous Q8H  . polyethylene glycol  17 g Oral Daily  . pregabalin  75 mg Oral QHS  . senna-docusate  1 tablet Oral BID   Continuous Infusions: . sodium chloride       LOS: 5 days   Time spent:30 minutes   Domenic Polite, MD [facility] Pager [phone]  If 7PM-7AM, please contact night-coverage [email or web address] Password [facility] 10/18/2016, 10:46 AM

## 2016-10-18 NOTE — Progress Notes (Addendum)
[facility] Progress Note    Subjective: Patient doing the same.  Still seems to be tender in the right upper quadrant.  Mentals status much improved.  Objective: Vital signs in last 24 hours: Temp:  [97.8 F (36.6 C)-99.4 F (37.4 C)] 97.8 F (36.6 C) (02/24 0802) Pulse Rate:  [78-95] 88 (02/24 0802) Resp:  [0-36] 16 (02/24 0802) BP: (86-125)/(41-70) 116/64 (02/24 0802) SpO2:  [91 %-100 %] 93 % (02/24 0802) Weight:  [89 kg (196 lb 1.6 oz)] 89 kg (196 lb 1.6 oz) (02/24 0425) Last BM Date: 10/15/16  Intake/Output from previous day: 02/23 0701 - 02/24 0700 In: 3739.3 [P.O.:120; I.V.:643.3; Blood:1126; IV Piggyback:500] Out: 150 [Urine:150] Intake/Output this shift: No intake/output data recorded.  General: No distress.  Will verbalize and communicate.  Lungs: clear.  Sats are 95% on 6L.  Abd: Soft, hypoactive bowel sounds.  Tender in the RUQ  Extremities: No changes.  Bilateral BKA's  Neuro: Intact, much more alert  Lab Results:  @LABLAST2 (wbc:2,hgb:2,hct:2,plt:2) BMET ) Recent Labs  10/17/16 1409 10/17/16 2248  NA 148* 147*  K 3.6 3.6  CL 106 110  CO2 29 28  GLUCOSE 132* 156*  BUN 56* 56*  CREATININE 3.05* 2.56*  CALCIUM 8.3* 8.0*   PT/INR  Recent Labs  10/16/16 0413 10/17/16 0506  LABPROT 19.2* 26.4*  INR 1.60 2.38   ABG  Recent Labs  10/17/16 0940 10/17/16 2315  PHART 7.465* 7.438  HCO3 31.6* 30.1*    Studies/Results: Ct Abdomen Pelvis Wo Contrast  Result Date: 10/17/2016 CLINICAL DATA:  Abdominal pain EXAM: CT ABDOMEN AND PELVIS WITHOUT CONTRAST TECHNIQUE: Multidetector CT imaging of the abdomen and pelvis was performed following the standard protocol without or intravenous contrast material administration. COMPARISON:  October 13, 2016 FINDINGS: Lower chest: There are persistent bilateral pleural effusions with bibasilar airspace consolidation, slightly more on the right than on the left. There is cardiomegaly. Pacemaker lead tips are  attached to the right atrium and right ventricle. Visualized pericardium is only minimally thickened. Hepatobiliary: No focal liver lesions are evident on this noncontrast enhanced study. The gallbladder is distended with what appears to represent a combination of small gallstones and sludge within the gallbladder. Gallbladder wall does not appear thickened by CT. Pancreas: No pancreatic mass or inflammatory focus. Pancreas appears somewhat atrophic. Spleen: No splenic lesions are evident. Adrenals/Urinary Tract: Adrenals appear normal bilaterally. Kidneys bilaterally show no mass or hydronephrosis on either side. There is no renal or ureteral calculus on either side. Urinary bladder is midline with wall thickness within normal limits. Note that susceptibility artifact from a total hip prosthesis on the left limits lower pelvic assessment. Stomach/Bowel: The rectal wall appears mildly thickened. There is no perirectal stranding or abscess in this area. No fistula. Soft tissue stranding is noted in the region of the cecum and proximal ascending colon. There are similar changes in the mid descending colonic region with localized thickening of the lateral Conal fascia on the right. No diverticular inflammation is seen in this area. No abscess or extraluminal air is seen in this area. There is no other mesenteric inflammation. No bowel obstruction. No free air or portal venous air. Vascular/Lymphatic: There is atherosclerotic calcification in the aorta and iliac arteries. Atherosclerotic calcification is noted more distal pelvic arterial vessels bilaterally. No aneurysm evident. There is moderate calcification in the mesenteric arteries without evident obstruction. No aneurysm is evident in the abdomen or pelvis. Reproductive: Prostate and seminal vesicles appear grossly normal with limitation of visualization of these  structures due to total hip replacement on the left causing artifact. Other: The appendix appears  normal. No ascites or abscess evident in the abdomen or pelvis. There is a small ventral hernia containing only fat. Musculoskeletal: There are no blastic or lytic bone lesions. Total hip replacement on the left. No intramuscular or abdominal wall lesion. IMPRESSION: Areas of mesenteric thickening adjacent to the proximal at ascending colon and mid descending colon. No associated diverticular inflammation seen in these areas. Suspect a degree of colitis in these areas. No bowel obstruction. No abscess. Appendix appears normal. Mild wall thickening in the rectum is noted without perirectal stranding or fistula. Suspect a degree of proctitis. This finding may warrant direct physical examination. Gallbladder is distended and contains small gallstones and sludge. Ultrasound of the gallbladder to further evaluate may be warranted given this finding. There are bilateral pleural effusions with patchy bibasilar consolidation. 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 No aneurysm. Small ventral hernia containing only fat. No renal or ureteral calculus.  No hydronephrosis. Status post total hip replacement on the left. Electronically Signed   By: Lowella Grip III M.D.   On: 10/17/2016 10:57   Dg Abd 1 View  Result Date: 10/16/2016 CLINICAL DATA:  Nausea. EXAM: ABDOMEN - 1 VIEW COMPARISON:  10/13/2016 . FINDINGS: Soft tissue structures are unremarkable. Distended loops of small and large bowel noted. Findings consist with adynamic ileus. Mild gastric distention also noted. Degenerative changes lumbar spine. Left hip replacement. IMPRESSION: Distended loops of small and large bowel noted most consistent with adynamic ileus. Mild gastric distention also noted. Follow-up abdominal series suggested to exclude developing small bowel obstruction. Electronically Signed   By: Marcello Moores  Register   On: 10/16/2016 16:06   Dg Chest Port 1 View  Result Date: 10/17/2016 CLINICAL DATA:  Acute  respiratory distress EXAM: PORTABLE CHEST 1 VIEW COMPARISON:  10/13/2016 chest radiograph. FINDINGS: Stable configuration of 3 lead left subclavian ICD. Stable cardiomediastinal silhouette with mild cardiomegaly. No pneumothorax. Small bilateral pleural effusions, slightly increased bilaterally. Stable mild pulmonary edema. Bibasilar lung opacities appear increased with associated volume loss. IMPRESSION: 1. Stable mild congestive heart failure. 2. Small bilateral pleural effusions appear slightly increased bilaterally. 3. Worsened bibasilar lung opacities and associated volume loss, favor atelectasis. Electronically Signed   By: Ilona Sorrel M.D.   On: 10/17/2016 10:30   US Abdomen Limited Ruq  Result Date: 10/17/2016 CLINICAL DATA:  Sepsis.  Acute cholecystitis. EXAM: US ABDOMEN LIMITED - RIGHT UPPER QUADRANT COMPARISON:  CT of the abdomen and pelvis the same day. CT of the abdomen 10/13/2016 FINDINGS: Gallbladder: The gallbladder is distended. Wall thickened measuring 9 mm. There is sludge throughout the gallbladder. Common bile duct: Diameter: The common bile duct is within normal limits at 6 mm Liver: No focal lesion identified. Within normal limits in parenchymal echogenicity. IMPRESSION: 1. Distension of the gallbladder with wall thickening 9 mm suggesting cholecystitis. 2. Diffuse gallbladder sludge. Electronically Signed   By: San Morelle M.D.   On: 10/17/2016 16:52    Anti-infectives: Anti-infectives    Start     Dose/Rate Route Frequency Ordered Stop   10/17/16 0900  piperacillin-tazobactam (ZOSYN) IVPB 3.375 g     3.375 g 12.5 mL/hr over 240 Minutes Intravenous Every 8 hours 10/17/16 0816        Assessment/Plan: s/p  check PT INR  Percutaneous drain today.  LOS: 5 days   Kathryne Eriksson. Dahlia Bailiff, MD, FACS [phone] [facility] 10/18/2016

## 2016-10-18 NOTE — Progress Notes (Signed)
2127 Patient had 8 beat run of ventricular tachycardia  returning to v paced rhythm.Patient asymptomatic blood pressure 89/51 no acute distress noted.Patient remains lethargic but arousable falls asleep easily while taking to patient.Spoke with Dr.Kakrandy.Will hold by mouth medications tonight and new orders received for labs.Will continue to monitor patient.

## 2016-10-18 NOTE — Progress Notes (Signed)
Hypoglycemic Event  CBG: 48  Treatment: D50 IV 50 mL  Symptoms: pt stated not feeling too good  Follow-up CBG: Time:2140 CBG Result:140  Possible Reasons for Event: Inadequate meal intake  Comments/MD notified:Per K. Schorr, replaced 1/2 NS with D51/2 at 68ml/hr and give Levemir 10 unit.   Caydence Enck N Iola Turri

## 2016-10-18 NOTE — Procedures (Signed)
Cholecystitis  S/p perc cholecystostomy  No comp Stable cx sent Full report in PACS

## 2016-10-18 NOTE — Progress Notes (Signed)
PCCM courtesy visit  Pt is hemodymically stable/ sats ok on 3lpm /multiple services seeing/ no pulmonary or CCM issues to address separately and no need for ICU  Please call if our services are needed.  Christinia Gully, MD Pulmonary and [place] [phone] After 5:30 PM or weekends, use [facility] [phone]

## 2016-10-18 NOTE — Progress Notes (Signed)
Advanced Heart Failure Rounding Note  PCP: Dr. Inda Merlin Primary Cardiologist: Dr. Caryl Comes   Subjective:    Admitted on 10/13/16 with SOB, acute on chronic systolic CHF. Also with abdominal pain and nausea/vomiting. Developed sepsis and increasing abdominal pain; surgery following and plans probable percutaneous drain for cholecystitis.  Creatinine bumped. Arlyce Harman and [employer] on hold.   EP saw and his device was reprogrammed to treat VT >130 bpm with ATP.   Mental status improved; complains of abd pain and mild dyspnea; no CP   Objective:   Weight Range: 196 lb 1.6 oz (89 kg) Body mass index is 28.96 kg/m.   Vital Signs:   Temp:  [97.8 F (36.6 C)-99.4 F (37.4 C)] 97.8 F (36.6 C) (02/24 0802) Pulse Rate:  [78-95] 88 (02/24 0802) Resp:  [0-36] 16 (02/24 0802) BP: (86-125)/(50-70) 116/64 (02/24 0802) SpO2:  [91 %-99 %] 93 % (02/24 0802) Weight:  [196 lb 1.6 oz (89 kg)] 196 lb 1.6 oz (89 kg) (02/24 0425) Last BM Date: 10/18/16  Weight change: Filed Weights   10/16/16 0540 10/17/16 0525 10/18/16 0425  Weight: 194 lb 9.6 oz (88.3 kg) 191 lb 6.4 oz (86.8 kg) 196 lb 1.6 oz (89 kg)    Intake/Output:   Intake/Output Summary (Last 24 hours) at 10/18/16 1115 Last data filed at 10/18/16 1005  Gross per 24 hour  Intake          3619.33 ml  Output              150 ml  Net          3469.33 ml     Physical Exam: General:  Ill appearing, NAD HEENT: normal Neck: supple Cor: RRR Lungs: Diminshed in bases.  Abdomen: Diffuse tenderness; no masses Extremities: Bilateral BKA. No thigh edema Neuro: grossly intact  Telemetry: sinus with V pacing and PVCs/NSVT; personally reviewed  Labs: CBC  Recent Labs  10/17/16 1409 10/18/16 0809  WBC 15.7* 15.6*  NEUTROABS 14.5*  --   HGB 13.3 12.1*  HCT 40.1 37.3*  MCV 75.2* 75.5*  PLT 125* 123XX123*   Basic Metabolic Panel  Recent Labs  10/15/16 1410  10/17/16 2248 10/18/16 0809  NA  --   < > 147* 151*  K  --   < > 3.6 3.5    CL  --   < > 110 110  CO2  --   < > 28 31  GLUCOSE  --   < > 156* 147*  BUN  --   < > 56* 56*  CREATININE  --   < > 2.56* 2.14*  CALCIUM  --   < > 8.0* 8.3*  MG 2.0  --  2.2  --   < > = values in this interval not displayed. BNP (last 3 results)  Recent Labs  12/28/15 0550 10/13/16 1401  BNP 380.3* 1,039.0*      Medications:     Scheduled Medications: . sodium chloride   Intravenous Once  . atorvastatin  10 mg Oral Daily  . Chlorhexidine Gluconate Cloth  6 each Topical Q0600  . insulin aspart  0-9 Units Subcutaneous TID WC  . insulin detemir  20 Units Subcutaneous BID  . mupirocin ointment  1 application Nasal BID  . piperacillin-tazobactam (ZOSYN)  IV  3.375 g Intravenous Q8H  . polyethylene glycol  17 g Oral Daily  . pregabalin  75 mg Oral QHS  . senna-docusate  1 tablet Oral BID    PRN  Medications: acetaminophen, guaiFENesin-dextromethorphan, ondansetron   Assessment/Plan   1. Acute on chronic combined diastolic and systolic CHF, NYHA class IIIb:  - Lasix, entresto, coreg on hold given acute renal insuff, ongoing abd process and borderline BP; will resume later as he improves. 2. PAF, s/p CRT-D: He is in NSR today.  - Device was reprogrammed this admission to lower threshold on VT rate for therapy.  - This patients CHA2DS2-VASc Score and unadjusted Ischemic Stroke Rate (% per year) is equal to 7.2 % stroke rate/year from a score of 5 Above score calculated as 1 point each if present [CHF, HTN, DM, Vascular=MI/PAD/Aortic Plaque, Age if 65-74, or Male], 2 points each if present [Age > 75, or Stroke/TIA/TE] - Coumadin on hold for upcoming procedure. 3. Abdominal pain/cholecystitis: Surgery following; for percutaneous drain today. 4. Acute on chronic stage III KD:  - baseline 1.40-1.42.  - Creatinine improving this AM; cardiac meds on hold; follow 5. Possible sepsis: - continue antibiotics per primary care.    Length of Stay: [place], MD   10/18/2016, 11:15 AM

## 2016-10-18 NOTE — Sedation Documentation (Signed)
Patient denies pain and is resting comfortably.  

## 2016-10-19 LAB — CBC
HCT: 40.2 % (ref 39.0–52.0)
HEMOGLOBIN: 13.1 g/dL (ref 13.0–17.0)
MCH: 24.7 pg — ABNORMAL LOW (ref 26.0–34.0)
MCHC: 32.6 g/dL (ref 30.0–36.0)
MCV: 75.7 fL — ABNORMAL LOW (ref 78.0–100.0)
PLATELETS: 132 10*3/uL — AB (ref 150–400)
RBC: 5.31 MIL/uL (ref 4.22–5.81)
RDW: 16.2 % — ABNORMAL HIGH (ref 11.5–15.5)
WBC: 11.1 10*3/uL — ABNORMAL HIGH (ref 4.0–10.5)

## 2016-10-19 LAB — COMPREHENSIVE METABOLIC PANEL
ALT: 97 U/L — ABNORMAL HIGH (ref 17–63)
AST: 120 U/L — ABNORMAL HIGH (ref 15–41)
Albumin: 2.3 g/dL — ABNORMAL LOW (ref 3.5–5.0)
Alkaline Phosphatase: 80 U/L (ref 38–126)
Anion gap: 11 (ref 5–15)
BILIRUBIN TOTAL: 1.2 mg/dL (ref 0.3–1.2)
BUN: 49 mg/dL — ABNORMAL HIGH (ref 6–20)
CALCIUM: 8.5 mg/dL — AB (ref 8.9–10.3)
CO2: 31 mmol/L (ref 22–32)
Chloride: 112 mmol/L — ABNORMAL HIGH (ref 101–111)
Creatinine, Ser: 1.8 mg/dL — ABNORMAL HIGH (ref 0.61–1.24)
GFR calc non Af Amer: 36 mL/min — ABNORMAL LOW (ref >60)
GFR, EST AFRICAN AMERICAN: 41 mL/min — AB (ref >60)
GLUCOSE: 106 mg/dL — AB (ref 65–99)
Potassium: 3.2 mmol/L — ABNORMAL LOW (ref 3.5–5.1)
Sodium: 154 mmol/L — ABNORMAL HIGH (ref 135–145)
TOTAL PROTEIN: 6.5 g/dL (ref 6.5–8.1)

## 2016-10-19 LAB — PREPARE FRESH FROZEN PLASMA
UNIT DIVISION: 0
UNIT DIVISION: 0

## 2016-10-19 LAB — GLUCOSE, CAPILLARY
Glucose-Capillary: 96 mg/dL (ref 65–99)
Glucose-Capillary: 140 mg/dL — ABNORMAL HIGH (ref 65–99)
Glucose-Capillary: 268 mg/dL — ABNORMAL HIGH (ref 65–99)
Glucose-Capillary: 268 mg/dL — ABNORMAL HIGH (ref 65–99)

## 2016-10-19 LAB — URINE CULTURE
Culture: <10000 — AB
Special Requests: NORMAL

## 2016-10-19 LAB — PROCALCITONIN: Procalcitonin: 1.76 ng/mL

## 2016-10-19 LAB — PROTIME-INR
INR: 1.26
PROTHROMBIN TIME: 15.9 s — AB (ref 11.4–15.2)

## 2016-10-19 MED ORDER — WARFARIN - PHARMACIST DOSING INPATIENT
Freq: Every day | Status: DC
  Administered 2016-10-19 – 2016-10-20 (×2)

## 2016-10-19 MED ORDER — DEXTROSE 5 % IV SOLN
INTRAVENOUS | Status: DC
  Administered 2016-10-19: 09:00:00 via INTRAVENOUS

## 2016-10-19 MED ORDER — WARFARIN SODIUM 5 MG PO TABS
5.0000 mg | ORAL_TABLET | Freq: Once | ORAL | Status: AC
  Administered 2016-10-19: 5 mg via ORAL
  Filled 2016-10-19: qty 1

## 2016-10-19 MED ORDER — POTASSIUM CHLORIDE CRYS ER 20 MEQ PO TBCR
40.0000 meq | EXTENDED_RELEASE_TABLET | Freq: Once | ORAL | Status: AC
  Administered 2016-10-19: 40 meq via ORAL
  Filled 2016-10-19: qty 2

## 2016-10-19 MED ORDER — CARVEDILOL 6.25 MG PO TABS
6.2500 mg | ORAL_TABLET | Freq: Two times a day (BID) | ORAL | Status: DC
  Administered 2016-10-19 – 2016-10-23 (×9): 6.25 mg via ORAL
  Filled 2016-10-19 (×9): qty 1

## 2016-10-19 MED ORDER — ENOXAPARIN SODIUM 100 MG/ML ~~LOC~~ SOLN
1.0000 mg/kg | Freq: Two times a day (BID) | SUBCUTANEOUS | Status: DC
  Administered 2016-10-19 – 2016-10-20 (×4): 90 mg via SUBCUTANEOUS
  Filled 2016-10-19 (×4): qty 1

## 2016-10-19 NOTE — Progress Notes (Addendum)
ANTICOAGULATION CONSULT NOTE - Initial Consult  Pharmacy Consult for enoxaparin Indication: atrial fibrillation  No Known Allergies  Patient Measurements: Height: 5\' 9"  (175.3 cm) Weight: 195 lb 3.2 oz (88.5 kg) IBW/kg (Calculated) : 70.7 Heparin Dosing Weight: 90 kg  Vital Signs: Temp: 98.4 F (36.9 C) (02/25 0738) Temp Source: Oral (02/25 0738) BP: 135/66 (02/25 0738) Pulse Rate: 84 (02/25 0738)  Labs:  Recent Labs  10/17/16 0506  10/17/16 1409 10/17/16 2248 10/18/16 0809 10/19/16 0451  HGB  --   < > 13.3  --  12.1* 13.1  HCT  --   < > 40.1  --  37.3* 40.2  PLT  --   < > 125*  --  122* 132*  LABPROT 26.4*  --   --   --  16.3* 15.9*  INR 2.38  --   --   --  1.30 1.26  CREATININE 3.05*  --  3.05* 2.56* 2.14* 1.80*  < > = values in this interval not displayed.  Estimated Creatinine Clearance: 40.2 mL/min (by C-G formula based on SCr of 1.8 mg/dL (H)).   Medical History: Past Medical History:  Diagnosis Date  . Anemia    takes Iron pill daily  . Arthritis   . Automatic implantable cardioverter-defibrillator in situ    a. 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  . CAD (coronary artery disease)   . Cardiomyopathy, nonischemic ([place])    a. Cath 2003: mild nonobstructive CAD, EF 25% at that time.  . Chronic systolic CHF (congestive heart failure) (HCC)    takes Furosemide daily as well as Aldactone  . CKD (chronic kidney disease), stage III   . Complication of anesthesia   . Constipation    takes Sennokot daily  . Dementia   . GERD (gastroesophageal reflux disease)    takes Nexium daily  . Headache    occasionally  . High cholesterol    takes Atorvastatin daily  . History of blood transfusion    no abnormal reaction noted  . History of bronchitis    > 1 yr ago  . History of kidney stones   . Hypertension    takes Coreg daily  . LBBB (left bundle branch block)    takes Coumadin daily  . NSVT (nonsustained ventricular  tachycardia) ([place])    a. Noted on ICD interrogation in 2011.  [name] PAD (peripheral artery disease) ([place])    a. 08/2013 Periph Angio/PTA: Abd Ao nl, RLE- 3v runoff, PT diff dzs, AT 90p, LLE 2v runoff, PT 100, AT 53m (diamondback ORA/chocolate balloon PTA).  [name] PAF (paroxysmal atrial fibrillation) ([place])    a. Noted on ICD interrogation 2012;  b. coumadin d/c'd 01/2013.  [name] PAF (paroxysmal atrial fibrillation) ([place])   . Peripheral neuropathy (HCC)    hands;numbness and tingling   . Pneumonia    hx of > 27yr ago  . PONV (postoperative nausea and vomiting)   . Type II diabetes mellitus ([place])    takes Levemir daily as well as Novolog  . Urinary frequency   . Urinary urgency     Assessment: 71 yo male on warfarin PTA for Afib, admitted with supratherapeutic INR s/p reversal with vitamin K. Warfarin has been on hold for placement of a percutaneous drain for cholecystitis 2/24. Last dose of warfarin 2/22. INR down to 1.26.   CrCl ~40 ml/min, weight 88.5 kg - down from admission, pt is NPO. CBC stable, no bleeding noted.   Goal of Therapy:  Monitor platelets by anticoagulation protocol: Yes   Plan:  Lovenox 90 mg subcut q12h Monitor Hgb, Pltc, s/sx bleeding, weight Follow up transition to [place], Pharm.D. PGY1 Pharmacy Resident 2/25/20188:07 AM Pager [phone]

## 2016-10-19 NOTE — Progress Notes (Signed)
Patient ID: [name] [name], male   DOB: 12/28/1945, 71 y.o.   MRN: [id number]    PROGRESS NOTE  [name] [name]  [id number] DOB: [birth year] DOA: 10/13/2016  PCP: [name], MD   Brief Narrative:  71 y.o. male with medical history significant of NICM status post ICD, CHF with EF to 20-25% in 2014, DM-2, CKD, PAF on coumadin, bilateral BKA who presented to the ED with c/o left sided throbbing chest pain and SOB during that last two weeks and abd pain. Patient has been seen by his cardiologist and Lasix was increased from 40 to 80 mg twice a day, but despite this measure he has worsening orthopnea and during the last two weeks sleeps on two pillows instead of one as before.  Assessment & Plan:  Sepsis/Acute cholecystitis -finally RUQ [place] 2/23 was suggestive of acute cholecystitis after 2 non diagnostic CT abdomens -CCS following -improving, BP more stable -continue IV Zosyn Day 3 -s/p Perc GB drain by IR 2/24, improving now -resume anticoagulation  Acute on chronic exacerbation of systolic CHF - was on lasix 80 mg IV BID, now held,  - ECHo with EF of 20-25% and grade 2 diastolic dysfunction - appreciate CHF team for input, - all diuretics and entresto held due to sepsis/hypotension  AKI on CKD - stage III, with hypernatremia -creat around 1.47 at baseline -now up to 3 in setting of sepsis/hypotension, now 1.8 -continue IVF change to d5W due to rising Na, stopped aldactone and entresto  Hypertension, essential  - BP low now, see med changes above  URinary retention - also confirmed on CT abd - now with foley, voiding trial prior to discharge, when abd issues better - UA on admission not suggestive of UTI  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  Bilateral BKA - ambulates with prosthesis  DVT prophylaxis: resume coumadin with lovenox bridge Code Status: Full  Family Communication:none at bedside, d/w wife and daughter 2/22 Disposition Plan: can downgrade to tele  Consultants:   CHF team  Procedures:   None  Antimicrobials:   None  Subjective: more alert, c/o abd pain Objective: Vitals:   10/19/16 0015 10/19/16 0354 10/19/16 0738 10/19/16 0739  BP: 121/70 124/65 135/66   Pulse: 81 81 84   Resp:   18   Temp: 98 F (36.7 C) 98.5 F (36.9 C) 98.4 F (36.9 C)   TempSrc: Oral Oral Oral   SpO2: 96% 94% (!) 88% 91%  Weight:  88.5 kg (195 lb 3.2 oz)    Height:        Intake/Output Summary (Last 24 hours) at 10/19/16 0942 Last data filed at 10/19/16 [id number]  Gross per 24 hour  Intake          1538.75 ml  Output             1150 ml  Net           388.75 ml   Filed Weights   10/17/16 0525 10/18/16 0425 10/19/16 0354  Weight: 86.8 kg (191 lb 6.4 oz) 89 kg (196 lb 1.6 oz) 88.5 kg (195 lb 3.2 oz)   Examination:  General exam: AAOx2 Respiratory system: diminished breath sounds at bases with crackles  Cardiovascular system: S1 & S2  heard, RRR. B BKA Gastrointestinal system: Abdomen is distended, soft and less tender in RUQ , GB drain noted, umbilical hernia noted-reducible, decreased bowel sounds Central nervous system more alert, . No focal neurological deficits. Psychiatry: unable to assess Ext: B/o BKA  Data Reviewed: I have personally reviewed following labs and imaging studies  CBC:  Recent Labs Lab 10/13/16 1213  10/16/16 0814 10/17/16 0728 10/17/16 1409 10/18/16 0809 10/19/16 0451  WBC 4.4  < > 17.1* 17.1* 15.7* 15.6* 11.1*  NEUTROABS 3.4  --   --   --  14.5*  --   --   HGB 11.3*  < > 14.0 13.2 13.3 12.1* 13.1  HCT 35.0*  < > 42.1 40.0 40.1 37.3* 40.2  MCV 74.9*  < > 75.3* 75.3* 75.2* 75.5* 75.7*  PLT 155  < > 160 137* 125* 122* 132*  < > = values in this interval not displayed. Basic  Metabolic Panel:  Recent Labs Lab 10/13/16 1749  10/15/16 1410  10/17/16 0506 10/17/16 1409 10/17/16 2248 10/18/16 0809 10/19/16 0451  NA  --   < >  --   < > 145 148* 147* 151* 154*  K  --   < >  --   < > 3.7 3.6 3.6 3.5 3.2*  CL  --   < >  --   < > 102 106 110 110 112*  CO2  --   < >  --   < > 30 29 28 31 31   GLUCOSE  --   < >  --   < > 201* 132* 156* 147* 106*  BUN  --   < >  --   < > 50* 56* 56* 56* 49*  CREATININE  --   < >  --   < > 3.05* 3.05* 2.56* 2.14* 1.80*  CALCIUM  --   < >  --   < > 8.8* 8.3* 8.0* 8.3* 8.5*  MG 1.9  --  2.0  --   --   --  2.2  --   --   < > = values in this interval not displayed. \Liver Function Tests:  Recent Labs Lab 10/13/16 1213 10/14/16 0402 10/17/16 1409 10/19/16 0451  AST 26 17 21  120*  ALT 53 39 26 97*  ALKPHOS 71 58 52 80  BILITOT 0.4 0.5 0.8 1.2  PROT 6.6 5.7* 6.2* 6.5  ALBUMIN 3.4* 3.0* 2.5* 2.3*   Coagulation Profile:  Recent Labs Lab 10/15/16 0438 10/16/16 0413 10/17/16 0506 10/18/16 0809 10/19/16 0451  INR 1.87 1.60 2.38 1.30 1.26   CBG:  Recent Labs Lab 10/18/16 1140 10/18/16 1605 10/18/16 2106 10/18/16 2140 10/19/16 0744  GLUCAP 115* 69 48* 140* 96   Urine analysis:    Component Value Date/Time   COLORURINE STRAW (A) 10/13/2016 1428   APPEARANCEUR CLEAR 10/13/2016 1428   LABSPEC 1.010 10/13/2016 1428   PHURINE 5.0 10/13/2016 1428   GLUCOSEU 50 (A) 10/13/2016 1428   HGBUR MODERATE (A) 10/13/2016 1428   BILIRUBINUR NEGATIVE 10/13/2016 1428   KETONESUR NEGATIVE 10/13/2016 1428   PROTEINUR 30 (A) 10/13/2016 1428   UROBILINOGEN 0.2 01/11/2015 1850   NITRITE NEGATIVE 10/13/2016 1428   LEUKOCYTESUR NEGATIVE 10/13/2016 1428   Radiology Studies: Ct Abdomen Pelvis Wo Contrast Result Date: 10/13/2016 1. Small bilateral pleural effusion. Bilateral lower lobe posterior atelectasis or infiltrate.  2. No nephrolithiasis. No hydronephrosis or hydroureter. Nonspecific mild bilateral perinephric stranding. No  calcified ureteral calculi.  3. There is moderate distended  urinary bladder without urinary bladder filling defects. No calcified calculi. Please note the urinary bladder measures at least 17 cm in length cranial caudally.  4. Normal appendix.  No pericecal inflammation.  5. No small bowel or colonic obstruction. Few diverticula are noted descending colon proximal sigmoid colon. No definite evidence of acute colitis or diverticulitis.  6. Limited evaluation of the pelvis due to metallic artifacts from left hip prosthesis.  7. There is disc space flattening with mild vacuum disc phenomenon and mild posterior disc bulge at L2-L3 level.  8. Mild anasarca infiltration of subcutaneous fat abdominal and pelvic wall.  9. There is a umbilical hernia containing omental fat best seen in sagittal image 93 measures 2 cm without evidence of acute complication. Mild skin thickening and subcutaneous stranding in lower anterior abdominal wall axial image 56 probable mild dermatitis or subcutaneous edema.   Dg Chest 2 View Result Date: 10/13/2016 Re- demonstration of heart failure with interstitial edema and effusions.   Scheduled Meds: . atorvastatin  10 mg Oral Daily  . Chlorhexidine Gluconate Cloth  6 each Topical Q0600  . enoxaparin (LOVENOX) injection  1 mg/kg Subcutaneous Q12H  . insulin aspart  0-9 Units Subcutaneous TID WC  . insulin detemir  10 Units Subcutaneous BID  . mupirocin ointment  1 application Nasal BID  . piperacillin-tazobactam (ZOSYN)  IV  3.375 g Intravenous Q8H  . polyethylene glycol  17 g Oral Daily  . potassium chloride  40 mEq Oral Once  . pregabalin  75 mg Oral QHS  . senna-docusate  1 tablet Oral BID   Continuous Infusions: . dextrose 75 mL/hr at 10/19/16 0915     LOS: 6 days   Time spent:30 minutes   Domenic Polite, MD [facility] Pager [phone]  If 7PM-7AM, please contact night-coverage [email or web address] Password [facility] 10/19/2016, 9:42 AM

## 2016-10-19 NOTE — Progress Notes (Signed)
Advanced Heart Failure Rounding Note  PCP: Dr. Inda Merlin Primary Cardiologist: Dr. Caryl Comes   Subjective:    Admitted on 10/13/16 with SOB, acute on chronic systolic CHF. Also with abdominal pain and nausea/vomiting. Developed sepsis and increasing abdominal pain; surgery following and percutaneous drain for cholecystitis placed 2/25..  Creatinine bumped. Cardiac meds held.  EP saw and his device was reprogrammed to treat VT >130 bpm with ATP.   Abd pain improving since drain placed; no CP or dyspnea.   Objective:   Weight Range: 195 lb 3.2 oz (88.5 kg) Body mass index is 28.83 kg/m.   Vital Signs:   Temp:  [98 F (36.7 C)-98.5 F (36.9 C)] 98.4 F (36.9 C) (02/25 0738) Pulse Rate:  [72-87] 84 (02/25 0738) Resp:  [15-22] 18 (02/25 0738) BP: (112-144)/(65-92) 135/66 (02/25 0738) SpO2:  [88 %-96 %] 91 % (02/25 0739) Weight:  [195 lb 3.2 oz (88.5 kg)] 195 lb 3.2 oz (88.5 kg) (02/25 0354) Last BM Date: 10/18/16  Weight change: Filed Weights   10/17/16 0525 10/18/16 0425 10/19/16 0354  Weight: 191 lb 6.4 oz (86.8 kg) 196 lb 1.6 oz (89 kg) 195 lb 3.2 oz (88.5 kg)    Intake/Output:   Intake/Output Summary (Last 24 hours) at 10/19/16 0931 Last data filed at 10/19/16 [id number]  Gross per 24 hour  Intake          1538.75 ml  Output             1150 ml  Net           388.75 ml     Physical Exam: General:  WD, obese, NAD HEENT: normal Neck: supple Cor: RRR Lungs: Mildly diminshed in bases.  Abdomen: Mildly distended; drain in place Extremities: Bilateral BKA. No thigh edema Neuro: grossly intact  Telemetry: sinus with V pacing and PVCs; personally reviewed  Labs: CBC  Recent Labs  10/17/16 1409 10/18/16 0809 10/19/16 0451  WBC 15.7* 15.6* 11.1*  NEUTROABS 14.5*  --   --   HGB 13.3 12.1* 13.1  HCT 40.1 37.3* 40.2  MCV 75.2* 75.5* 75.7*  PLT 125* 122* Q000111Q*   Basic Metabolic Panel  Recent Labs  10/17/16 2248 10/18/16 0809 10/19/16 0451  NA 147* 151* 154*    K 3.6 3.5 3.2*  CL 110 110 112*  CO2 28 31 31   GLUCOSE 156* 147* 106*  BUN 56* 56* 49*  CREATININE 2.56* 2.14* 1.80*  CALCIUM 8.0* 8.3* 8.5*  MG 2.2  --   --    BNP (last 3 results)  Recent Labs  12/28/15 0550 10/13/16 1401  BNP 380.3* 1,039.0*      Medications:     Scheduled Medications: . atorvastatin  10 mg Oral Daily  . Chlorhexidine Gluconate Cloth  6 each Topical Q0600  . enoxaparin (LOVENOX) injection  1 mg/kg Subcutaneous Q12H  . insulin aspart  0-9 Units Subcutaneous TID WC  . insulin detemir  10 Units Subcutaneous BID  . mupirocin ointment  1 application Nasal BID  . piperacillin-tazobactam (ZOSYN)  IV  3.375 g Intravenous Q8H  . polyethylene glycol  17 g Oral Daily  . pregabalin  75 mg Oral QHS  . senna-docusate  1 tablet Oral BID    PRN Medications: acetaminophen, guaiFENesin-dextromethorphan, morphine injection, ondansetron   Assessment/Plan   1. Acute on chronic combined diastolic and systolic CHF, NYHA class IIIb:  - I/O +390. Lasix, entresto, coreg held given acute renal insuff, ongoing abd process and borderline BP;  will resume coreg at lower dose today; resume diuretic and entresto later as BP and renal function continue to improve. 2. PAF, s/p CRT-D: He is in NSR today.  - Device was reprogrammed this admission to lower threshold on VT rate for therapy.  - This patients CHA2DS2-VASc Score and unadjusted Ischemic Stroke Rate (% per year) is equal to 7.2 % stroke rate/year from a score of 5 Above score calculated as 1 point each if present [CHF, HTN, DM, Vascular=MI/PAD/Aortic Plaque, Age if 65-74, or Male], 2 points each if present [Age > 75, or Stroke/TIA/TE] - Coumadin on hold; will resume per surgery. 3. Abdominal pain/cholecystitis: Surgery following; percutaneous drain now in place. 4. Acute on chronic stage III KD:  - baseline 1.40-1.42.  - Creatinine improving this AM; cardiac meds on hold; follow 5. Possible sepsis: - continue  antibiotics per primary care.    Length of Stay: 6  Kirk Ruths, MD  10/19/2016, 9:31 AM

## 2016-10-19 NOTE — Progress Notes (Signed)
Referring Physician(s): Domenic Polite  Supervising Physician: Daryll Brod  Patient Status:  [facility] - In-pt  Chief Complaint:  Acute cholecystitis S/P Perc Chole by [facility] 2/24  Subjective:  Awake, still not very talkative. Still not good appetite but states he feels a little better.  Allergies: Patient has no known allergies.  Medications: Prior to Admission medications   Medication Sig Start Date End Date Taking? Authorizing Provider  acetaminophen (TYLENOL) 325 MG tablet Take 2 tablets (650 mg total) by mouth every 6 (six) hours as needed for mild pain (or Fever >/= 101). 05/03/16  Yes Maryann Mikhail, DO  aspirin EC 81 MG tablet Take 81 mg by mouth daily.   Yes Historical Provider, MD  atorvastatin (LIPITOR) 10 MG tablet Take 1 tablet (10 mg total) by mouth daily. 01/15/15  Yes Daniel J Angiulli, PA-C  carvedilol (COREG) 12.5 MG tablet Take 1 tablet (12.5 mg total) by mouth 2 (two) times daily with a meal. 01/15/15  Yes Daniel J Angiulli, PA-C  Cholecalciferol (VITAMIN D3) 10000 units TABS Take 1 tablet by mouth daily.   Yes Historical Provider, MD  furosemide (LASIX) 40 MG tablet Take 1 tablet (40 mg total) by mouth daily. Patient taking differently: Take 40-80 mg by mouth daily. Monday,wednesday,friday, Saturday ( 80 mg 4  Days a week ) all other days 40 mg 01/02/16  Yes Theodis Blaze, MD  insulin detemir (LEVEMIR) 100 UNIT/ML injection 25 unit AM and 35 unit PM Patient taking differently: Inject 25-35 Units into the skin See admin instructions. 25 unit AM and 35 unit PM 01/02/16  Yes Theodis Blaze, MD  iron polysaccharides (NIFEREX) 150 MG capsule Take 1 capsule (150 mg total) by mouth daily. 01/15/15  Yes Daniel J Angiulli, PA-C  Multiple Vitamin (MULTIVITAMIN WITH MINERALS) TABS tablet Take 1 tablet by mouth daily.   Yes Historical Provider, MD  omeprazole (PRILOSEC) 20 MG capsule Take 20 mg by mouth daily.   Yes Historical Provider, MD  pregabalin (LYRICA) 75 MG capsule Take  1 capsule (75 mg total) by mouth 2 (two) times daily. 01/15/15  Yes Daniel J Angiulli, PA-C  sacubitril-valsartan (ENTRESTO) 24-26 MG Take 1 tablet by mouth 2 (two) times daily. 10/31/15  Yes Deboraha Sprang, MD  senna-docusate (SENOKOT-S) 8.6-50 MG per tablet Take 1 tablet by mouth 2 (two) times daily. 11/28/13  Yes Ivan Anchors Love, PA-C  warfarin (COUMADIN) 10 MG tablet Take 10-15 mg by mouth daily. Wednesday, Saturday 15 mg and all other days are 10 mg   Yes Historical Provider, MD  warfarin (COUMADIN) 10 MG tablet TAKE AS DIRECTED BY COUMADIN CLINIC Patient not taking: Reported on 10/13/2016 07/24/16   Deboraha Sprang, MD     Vital Signs: BP 135/66 (BP Location: Right Arm)   Pulse 84   Temp 98.4 F (36.9 C) (Oral)   Resp 18   Ht 5\' 9"  (1.753 m)   Wt 195 lb 3.2 oz (88.5 kg)   SpO2 91%   BMI 28.83 kg/m   Physical Exam Awake and Alert NAD Abdomen less tender Drain in place, ~100 mL output overnight   Imaging: Ct Abdomen Pelvis Wo Contrast  Result Date: 10/17/2016 CLINICAL DATA:  Abdominal pain EXAM: CT ABDOMEN AND PELVIS WITHOUT CONTRAST TECHNIQUE: Multidetector CT imaging of the abdomen and pelvis was performed following the standard protocol without or intravenous contrast material administration. COMPARISON:  October 13, 2016 FINDINGS: Lower chest: There are persistent bilateral pleural effusions with bibasilar airspace consolidation, slightly  more on the right than on the left. There is cardiomegaly. Pacemaker lead tips are attached to the right atrium and right ventricle. Visualized pericardium is only minimally thickened. Hepatobiliary: No focal liver lesions are evident on this noncontrast enhanced study. The gallbladder is distended with what appears to represent a combination of small gallstones and sludge within the gallbladder. Gallbladder wall does not appear thickened by CT. Pancreas: No pancreatic mass or inflammatory focus. Pancreas appears somewhat atrophic. Spleen: No splenic  lesions are evident. Adrenals/Urinary Tract: Adrenals appear normal bilaterally. Kidneys bilaterally show no mass or hydronephrosis on either side. There is no renal or ureteral calculus on either side. Urinary bladder is midline with wall thickness within normal limits. Note that susceptibility artifact from a total hip prosthesis on the left limits lower pelvic assessment. Stomach/Bowel: The rectal wall appears mildly thickened. There is no perirectal stranding or abscess in this area. No fistula. Soft tissue stranding is noted in the region of the cecum and proximal ascending colon. There are similar changes in the mid descending colonic region with localized thickening of the lateral Conal fascia on the right. No diverticular inflammation is seen in this area. No abscess or extraluminal air is seen in this area. There is no other mesenteric inflammation. No bowel obstruction. No free air or portal venous air. Vascular/Lymphatic: There is atherosclerotic calcification in the aorta and iliac arteries. Atherosclerotic calcification is noted more distal pelvic arterial vessels bilaterally. No aneurysm evident. There is moderate calcification in the mesenteric arteries without evident obstruction. No aneurysm is evident in the abdomen or pelvis. Reproductive: Prostate and seminal vesicles appear grossly normal with limitation of visualization of these structures due to total hip replacement on the left causing artifact. Other: The appendix appears normal. No ascites or abscess evident in the abdomen or pelvis. There is a small ventral hernia containing only fat. Musculoskeletal: There are no blastic or lytic bone lesions. Total hip replacement on the left. No intramuscular or abdominal wall lesion. IMPRESSION: Areas of mesenteric thickening adjacent to the proximal at ascending colon and mid descending colon. No associated diverticular inflammation seen in these areas. Suspect a degree of colitis in these areas. No  bowel obstruction. No abscess. Appendix appears normal. Mild wall thickening in the rectum is noted without perirectal stranding or fistula. Suspect a degree of proctitis. This finding may warrant direct physical examination. Gallbladder is distended and contains small gallstones and sludge. Ultrasound of the gallbladder to further evaluate may be warranted given this finding. There are bilateral pleural effusions with patchy bibasilar consolidation. 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 No aneurysm. Small ventral hernia containing only fat. No renal or ureteral calculus.  No hydronephrosis. Status post total hip replacement on the left. Electronically Signed   By: Lowella Grip III M.D.   On: 10/17/2016 10:57   Dg Abd 1 View  Result Date: 10/16/2016 CLINICAL DATA:  Nausea. EXAM: ABDOMEN - 1 VIEW COMPARISON:  10/13/2016 . FINDINGS: Soft tissue structures are unremarkable. Distended loops of small and large bowel noted. Findings consist with adynamic ileus. Mild gastric distention also noted. Degenerative changes lumbar spine. Left hip replacement. IMPRESSION: Distended loops of small and large bowel noted most consistent with adynamic ileus. Mild gastric distention also noted. Follow-up abdominal series suggested to exclude developing small bowel obstruction. Electronically Signed   By: Marcello Moores  Register   On: 10/16/2016 16:06   Ir Perc Cholecystostomy  Result Date: 10/18/2016 INDICATION: ACUTE CHOLECYSTITIS, NON OPERATIVE CANDIDATE EXAM: CHOLECYSTOSTOMY  MEDICATIONS: 3.375 g Zosyn; The antibiotic was administered within an appropriate time frame prior to the initiation of the procedure. ANESTHESIA/SEDATION: Moderate (conscious) sedation was employed during this procedure. A total of Versed 0.5 mg and Fentanyl 25 mcg was administered intravenously. Moderate Sedation Time: 10 minutes. The patient's level of consciousness and vital signs were monitored continuously by  radiology nursing throughout the procedure under my direct supervision. FLUOROSCOPY TIME:  Fluoroscopy Time:  18 seconds (3 mGy). COMPLICATIONS: None immediate. PROCEDURE: Informed written consent was obtained from the patient's family after a thorough discussion of the procedural risks, benefits and alternatives. All questions were addressed. Maximal Sterile Barrier Technique was utilized including caps, mask, sterile gowns, sterile gloves, sterile drape, hand hygiene and skin antiseptic. A timeout was performed prior to the initiation of the procedure. Previous imaging reviewed. Preliminary ultrasound performed. Distended thick-walled gallbladder demonstrated in the mid axillary line through a lower intercostal space. Overlying skin marked. Under sterile conditions and local anesthesia, percutaneous transhepatic needle access performed of the distended gallbladder. Needle position confirmed with ultrasound. Images obtained for documentation. There was return of bile. Sample sent for Gram stain and culture. Guidewire advanced easily followed by the Accustick dilator set. Amplatz guidewire exchange performed. Tract dilatation performed to insert a 10 [place] drain. Retention loop formed in the gallbladder. Position confirmed with a gentle contrast injection. Images obtained for documentation. Catheter secured with a Prolene suture and connected to external gravity drainage bag. Sterile dressing applied. No immediate complication. Patient tolerated the procedure well. IMPRESSION: Successful ultrasound and fluoroscopic 10 French percutaneous transhepatic cholecystostomy. Electronically Signed   By: Jerilynn Mages.  Shick M.D.   On: 10/18/2016 14:03   Dg Chest Port 1 View  Result Date: 10/17/2016 CLINICAL DATA:  Acute respiratory distress EXAM: PORTABLE CHEST 1 VIEW COMPARISON:  10/13/2016 chest radiograph. FINDINGS: Stable configuration of 3 lead left subclavian ICD. Stable cardiomediastinal silhouette with mild cardiomegaly.  No pneumothorax. Small bilateral pleural effusions, slightly increased bilaterally. Stable mild pulmonary edema. Bibasilar lung opacities appear increased with associated volume loss. IMPRESSION: 1. Stable mild congestive heart failure. 2. Small bilateral pleural effusions appear slightly increased bilaterally. 3. Worsened bibasilar lung opacities and associated volume loss, favor atelectasis. Electronically Signed   By: Ilona Sorrel M.D.   On: 10/17/2016 10:30   US Abdomen Limited Ruq  Result Date: 10/17/2016 CLINICAL DATA:  Sepsis.  Acute cholecystitis. EXAM: US ABDOMEN LIMITED - RIGHT UPPER QUADRANT COMPARISON:  CT of the abdomen and pelvis the same day. CT of the abdomen 10/13/2016 FINDINGS: Gallbladder: The gallbladder is distended. Wall thickened measuring 9 mm. There is sludge throughout the gallbladder. Common bile duct: Diameter: The common bile duct is within normal limits at 6 mm Liver: No focal lesion identified. Within normal limits in parenchymal echogenicity. IMPRESSION: 1. Distension of the gallbladder with wall thickening 9 mm suggesting cholecystitis. 2. Diffuse gallbladder sludge. Electronically Signed   By: San Morelle M.D.   On: 10/17/2016 16:52    Labs:  CBC:  Recent Labs  10/17/16 0728 10/17/16 1409 10/18/16 0809 10/19/16 0451  WBC 17.1* 15.7* 15.6* 11.1*  HGB 13.2 13.3 12.1* 13.1  HCT 40.0 40.1 37.3* 40.2  PLT 137* 125* 122* 132*    COAGS:  Recent Labs  12/28/15 0550  10/16/16 0413 10/17/16 0506 10/18/16 0809 10/19/16 0451  INR  --   < > 1.60 2.38 1.30 1.26  APTT 47*  --   --   --   --   --   < > =  values in this interval not displayed.  BMP:  Recent Labs  10/17/16 1409 10/17/16 2248 10/18/16 0809 10/19/16 0451  NA 148* 147* 151* 154*  K 3.6 3.6 3.5 3.2*  CL 106 110 110 112*  CO2 29 28 31 31   GLUCOSE 132* 156* 147* 106*  BUN 56* 56* 56* 49*  CALCIUM 8.3* 8.0* 8.3* 8.5*  CREATININE 3.05* 2.56* 2.14* 1.80*  GFRNONAA 19* 23* 29* 36*    GFRAA 22* 27* 34* 41*    LIVER FUNCTION TESTS:  Recent Labs  10/13/16 1213 10/14/16 0402 10/17/16 1409 10/19/16 0451  BILITOT 0.4 0.5 0.8 1.2  AST 26 17 21  120*  ALT 53 39 26 97*  ALKPHOS 71 58 52 80  PROT 6.6 5.7* 6.2* 6.5  ALBUMIN 3.4* 3.0* 2.5* 2.3*    Assessment and Plan:  Acute cholecystitis  S/P Perc Chole by [facility] 10/18/2016  Continue antibiotics  Continue routine drain care.  Electronically Signed: Murrell Redden  PA-C 10/19/2016, 11:42 AM   I spent a total of 15 Minutes at the the patient's bedside AND on the patient's hospital floor or unit, greater than 50% of which was counseling/coordinating care for f/u after perc chole

## 2016-10-19 NOTE — Progress Notes (Signed)
[facility] Progress Note    Subjective: Patient doing okay, still no appetite.  Says that he feels better.   Objective: Vital signs in last 24 hours: Temp:  [98 F (36.7 C)-98.5 F (36.9 C)] 98.4 F (36.9 C) (02/25 0738) Pulse Rate:  [72-87] 84 (02/25 0738) Resp:  [15-22] 18 (02/25 0738) BP: (112-144)/(65-92) 135/66 (02/25 0738) SpO2:  [88 %-96 %] 91 % (02/25 0739) Weight:  [88.5 kg (195 lb 3.2 oz)] 88.5 kg (195 lb 3.2 oz) (02/25 0354) Last BM Date: 10/18/16  Intake/Output from previous day: 02/24 0701 - 02/25 0700 In: 1538.8 [P.O.:125; I.V.:1263.8; IV Piggyback:150] Out: 1150 [Urine:1150] Intake/Output this shift: No intake/output data recorded.  General: No acute distress  Lungs: Clear  Abd: Soft, good bowel sounds.  100cc dark bile in drainage bag  Extremities: No changes  Neuro: Intact  Lab Results:  @LABLAST2 (wbc:2,hgb:2,hct:2,plt:2) BMET ) Recent Labs  10/18/16 0809 10/19/16 0451  NA 151* 154*  K 3.5 3.2*  CL 110 112*  CO2 31 31  GLUCOSE 147* 106*  BUN 56* 49*  CREATININE 2.14* 1.80*  CALCIUM 8.3* 8.5*   PT/INR  Recent Labs  10/18/16 0809 10/19/16 0451  LABPROT 16.3* 15.9*  INR 1.30 1.26   ABG  Recent Labs  10/17/16 0940 10/17/16 2315  PHART 7.465* 7.438  HCO3 31.6* 30.1*    Studies/Results: Ct Abdomen Pelvis Wo Contrast  Result Date: 10/17/2016 CLINICAL DATA:  Abdominal pain EXAM: CT ABDOMEN AND PELVIS WITHOUT CONTRAST TECHNIQUE: Multidetector CT imaging of the abdomen and pelvis was performed following the standard protocol without or intravenous contrast material administration. COMPARISON:  October 13, 2016 FINDINGS: Lower chest: There are persistent bilateral pleural effusions with bibasilar airspace consolidation, slightly more on the right than on the left. There is cardiomegaly. Pacemaker lead tips are attached to the right atrium and right ventricle. Visualized pericardium is only minimally thickened. Hepatobiliary: No focal  liver lesions are evident on this noncontrast enhanced study. The gallbladder is distended with what appears to represent a combination of small gallstones and sludge within the gallbladder. Gallbladder wall does not appear thickened by CT. Pancreas: No pancreatic mass or inflammatory focus. Pancreas appears somewhat atrophic. Spleen: No splenic lesions are evident. Adrenals/Urinary Tract: Adrenals appear normal bilaterally. Kidneys bilaterally show no mass or hydronephrosis on either side. There is no renal or ureteral calculus on either side. Urinary bladder is midline with wall thickness within normal limits. Note that susceptibility artifact from a total hip prosthesis on the left limits lower pelvic assessment. Stomach/Bowel: The rectal wall appears mildly thickened. There is no perirectal stranding or abscess in this area. No fistula. Soft tissue stranding is noted in the region of the cecum and proximal ascending colon. There are similar changes in the mid descending colonic region with localized thickening of the lateral Conal fascia on the right. No diverticular inflammation is seen in this area. No abscess or extraluminal air is seen in this area. There is no other mesenteric inflammation. No bowel obstruction. No free air or portal venous air. Vascular/Lymphatic: There is atherosclerotic calcification in the aorta and iliac arteries. Atherosclerotic calcification is noted more distal pelvic arterial vessels bilaterally. No aneurysm evident. There is moderate calcification in the mesenteric arteries without evident obstruction. No aneurysm is evident in the abdomen or pelvis. Reproductive: Prostate and seminal vesicles appear grossly normal with limitation of visualization of these structures due to total hip replacement on the left causing artifact. Other: The appendix appears normal. No ascites or abscess evident in  the abdomen or pelvis. There is a small ventral hernia containing only fat.  Musculoskeletal: There are no blastic or lytic bone lesions. Total hip replacement on the left. No intramuscular or abdominal wall lesion. IMPRESSION: Areas of mesenteric thickening adjacent to the proximal at ascending colon and mid descending colon. No associated diverticular inflammation seen in these areas. Suspect a degree of colitis in these areas. No bowel obstruction. No abscess. Appendix appears normal. Mild wall thickening in the rectum is noted without perirectal stranding or fistula. Suspect a degree of proctitis. This finding may warrant direct physical examination. Gallbladder is distended and contains small gallstones and sludge. Ultrasound of the gallbladder to further evaluate may be warranted given this finding. There are bilateral pleural effusions with patchy bibasilar consolidation. 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 No aneurysm. Small ventral hernia containing only fat. No renal or ureteral calculus.  No hydronephrosis. Status post total hip replacement on the left. Electronically Signed   By: Lowella Grip III M.D.   On: 10/17/2016 10:57   Ir Perc Cholecystostomy  Result Date: 10/18/2016 INDICATION: ACUTE CHOLECYSTITIS, NON OPERATIVE CANDIDATE EXAM: CHOLECYSTOSTOMY MEDICATIONS: 3.375 g Zosyn; The antibiotic was administered within an appropriate time frame prior to the initiation of the procedure. ANESTHESIA/SEDATION: Moderate (conscious) sedation was employed during this procedure. A total of Versed 0.5 mg and Fentanyl 25 mcg was administered intravenously. Moderate Sedation Time: 10 minutes. The patient's level of consciousness and vital signs were monitored continuously by radiology nursing throughout the procedure under my direct supervision. FLUOROSCOPY TIME:  Fluoroscopy Time:  18 seconds (3 mGy). COMPLICATIONS: None immediate. PROCEDURE: Informed written consent was obtained from the patient's family after a thorough discussion of the  procedural risks, benefits and alternatives. All questions were addressed. Maximal Sterile Barrier Technique was utilized including caps, mask, sterile gowns, sterile gloves, sterile drape, hand hygiene and skin antiseptic. A timeout was performed prior to the initiation of the procedure. Previous imaging reviewed. Preliminary ultrasound performed. Distended thick-walled gallbladder demonstrated in the mid axillary line through a lower intercostal space. Overlying skin marked. Under sterile conditions and local anesthesia, percutaneous transhepatic needle access performed of the distended gallbladder. Needle position confirmed with ultrasound. Images obtained for documentation. There was return of bile. Sample sent for Gram stain and culture. Guidewire advanced easily followed by the Accustick dilator set. Amplatz guidewire exchange performed. Tract dilatation performed to insert a 10 [place] drain. Retention loop formed in the gallbladder. Position confirmed with a gentle contrast injection. Images obtained for documentation. Catheter secured with a Prolene suture and connected to external gravity drainage bag. Sterile dressing applied. No immediate complication. Patient tolerated the procedure well. IMPRESSION: Successful ultrasound and fluoroscopic 10 French percutaneous transhepatic cholecystostomy. Electronically Signed   By: Jerilynn Mages.  Shick M.D.   On: 10/18/2016 14:03   Dg Chest Port 1 View  Result Date: 10/17/2016 CLINICAL DATA:  Acute respiratory distress EXAM: PORTABLE CHEST 1 VIEW COMPARISON:  10/13/2016 chest radiograph. FINDINGS: Stable configuration of 3 lead left subclavian ICD. Stable cardiomediastinal silhouette with mild cardiomegaly. No pneumothorax. Small bilateral pleural effusions, slightly increased bilaterally. Stable mild pulmonary edema. Bibasilar lung opacities appear increased with associated volume loss. IMPRESSION: 1. Stable mild congestive heart failure. 2. Small bilateral pleural  effusions appear slightly increased bilaterally. 3. Worsened bibasilar lung opacities and associated volume loss, favor atelectasis. Electronically Signed   By: Ilona Sorrel M.D.   On: 10/17/2016 10:30   US Abdomen Limited Ruq  Result Date: 10/17/2016 CLINICAL DATA:  Sepsis.  Acute cholecystitis. EXAM: US ABDOMEN LIMITED - RIGHT UPPER QUADRANT COMPARISON:  CT of the abdomen and pelvis the same day. CT of the abdomen 10/13/2016 FINDINGS: Gallbladder: The gallbladder is distended. Wall thickened measuring 9 mm. There is sludge throughout the gallbladder. Common bile duct: Diameter: The common bile duct is within normal limits at 6 mm Liver: No focal lesion identified. Within normal limits in parenchymal echogenicity. IMPRESSION: 1. Distension of the gallbladder with wall thickening 9 mm suggesting cholecystitis. 2. Diffuse gallbladder sludge. Electronically Signed   By: San Morelle M.D.   On: 10/17/2016 16:52    Anti-infectives: Anti-infectives    Start     Dose/Rate Route Frequency Ordered Stop   10/17/16 0900  piperacillin-tazobactam (ZOSYN) IVPB 3.375 g     3.375 g 12.5 mL/hr over 240 Minutes Intravenous Every 8 hours 10/17/16 0816        Assessment/Plan: s/p  Advance diet Can restart anticoagulation  Will need to follow up with me and the drain clinic in follow up.  LOS: 6 days   Kathryne Eriksson. Dahlia Bailiff, MD, FACS [phone] [facility] 10/19/2016

## 2016-10-19 NOTE — Progress Notes (Signed)
Rehab Admissions Coordinator Note:  Patient was screened by [name] for appropriateness for an Inpatient Acute Rehab Consult.  At this time, we are recommending Inpatient Rehab consult.  Jodell Cipro M 10/19/2016, 6:05 PM  I can be reached at [phone].

## 2016-10-19 NOTE — Progress Notes (Signed)
ANTICOAGULATION CONSULT NOTE - Initial Consult  Pharmacy Consult for enoxaparin bridge + warfarin Indication: atrial fibrillation  No Known Allergies  Patient Measurements: Height: 5\' 9"  (175.3 cm) Weight: 195 lb 3.2 oz (88.5 kg) IBW/kg (Calculated) : 70.7 Heparin Dosing Weight: 90 kg  Vital Signs: Temp: 98.4 F (36.9 C) (02/25 0738) Temp Source: Oral (02/25 0738) BP: 135/66 (02/25 0738) Pulse Rate: 84 (02/25 0738)  Labs:  Recent Labs  10/17/16 0506  10/17/16 1409 10/17/16 2248 10/18/16 0809 10/19/16 0451  HGB  --   < > 13.3  --  12.1* 13.1  HCT  --   < > 40.1  --  37.3* 40.2  PLT  --   < > 125*  --  122* 132*  LABPROT 26.4*  --   --   --  16.3* 15.9*  INR 2.38  --   --   --  1.30 1.26  CREATININE 3.05*  --  3.05* 2.56* 2.14* 1.80*  < > = values in this interval not displayed.  Estimated Creatinine Clearance: 40.2 mL/min (by C-G formula based on SCr of 1.8 mg/dL (H)).   Medical History: Past Medical History:  Diagnosis Date  . Anemia    takes Iron pill daily  . Arthritis   . Automatic implantable cardioverter-defibrillator in situ    a. 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  . CAD (coronary artery disease)   . Cardiomyopathy, nonischemic ([place])    a. Cath 2003: mild nonobstructive CAD, EF 25% at that time.  . Chronic systolic CHF (congestive heart failure) (HCC)    takes Furosemide daily as well as Aldactone  . CKD (chronic kidney disease), stage III   . Complication of anesthesia   . Constipation    takes Sennokot daily  . Dementia   . GERD (gastroesophageal reflux disease)    takes Nexium daily  . Headache    occasionally  . High cholesterol    takes Atorvastatin daily  . History of blood transfusion    no abnormal reaction noted  . History of bronchitis    > 1 yr ago  . History of kidney stones   . Hypertension    takes Coreg daily  . LBBB (left bundle branch block)    takes Coumadin daily  . NSVT (nonsustained  ventricular tachycardia) ([place])    a. Noted on ICD interrogation in 2011.  [name] PAD (peripheral artery disease) ([place])    a. 08/2013 Periph Angio/PTA: Abd Ao nl, RLE- 3v runoff, PT diff dzs, AT 90p, LLE 2v runoff, PT 100, AT 85m (diamondback ORA/chocolate balloon PTA).  [name] PAF (paroxysmal atrial fibrillation) ([place])    a. Noted on ICD interrogation 2012;  b. coumadin d/c'd 01/2013.  [name] PAF (paroxysmal atrial fibrillation) ([place])   . Peripheral neuropathy (HCC)    hands;numbness and tingling   . Pneumonia    hx of > 55yr ago  . PONV (postoperative nausea and vomiting)   . Type II diabetes mellitus ([place])    takes Levemir daily as well as Novolog  . Urinary frequency   . Urinary urgency     Assessment: 71 yo male on warfarin PTA for Afib, admitted with supratherapeutic INR s/p reversal with vitamin K. Warfarin has been on hold for placement of a percutaneous drain for cholecystitis 2/24. Last dose of warfarin 2/22. INR down to 1.26. Today is D#1 of lovenox bridge.  PTA warfarin dose: 10mg  daily except 15mg  on Wed/Sat   CrCl ~40 ml/min, weight 88.5  kg - down from admission, pt has been NPO - clear liquids restarted 2/25. CBC stable, no bleeding noted.   Goal of Therapy:  Monitor platelets by anticoagulation protocol: Yes   Plan:  Warfarin 5 mg PO x1 tonight -watch for diet tolerance Lovenox 90 mg subcut q12h Monitor Hgb, Pltc, s/sx bleeding, weight Follow up transition to [place], Pharm.D. PGY1 Pharmacy Resident 2/25/20189:50 AM Pager [phone]

## 2016-10-19 NOTE — Evaluation (Signed)
Physical Therapy Evaluation Patient Details Name: [name] [name] MRN: [id number] DOB: 04-Feb-1946 Today's Date: 10/19/2016   History of Present Illness  [name] [name] is a 71 y.o. male PMH NICM s/p ICD, CHF with EF to 20-25%, DM-2, CKD, PAF on coumadin, h/o bilateral BKA, admitted to [facility] 10/13/16 with CHF exacerbation. .  pt also with sepsis and UTI.    Clinical Impression  Pt was living at home with 24/7 family assist.  He was walking some with prostheses but using wheelchair mostly. He has history of falls - wife reports approx 4 in last 3 months.  Pt has been on CIR, and in SNF several times and several episodes with [facility] agencies.  Pt will need intense therapy to return to functional status - family prefers CIR setting if pt qualifies.  I agree that pt could benefit from this setting in order to get back to his home with 24/7 care.  Today pt sat EOB with PT - will need more help to get him into standing/transfers.    Follow Up Recommendations SNF;CIR    Equipment Recommendations  None recommended by PT    Recommendations for Other Services Rehab consult     Precautions / Restrictions Precautions Precautions: Fall Required Braces or Orthoses:  (pt has bilateral prostheses in his room)      Mobility  Bed Mobility Overal bed mobility: Needs Assistance Bed Mobility: Rolling;Sidelying to Sit;Sit to Sidelying Rolling: Mod assist Sidelying to sit: Mod assist     Sit to sidelying: Mod assist General bed mobility comments: pt needed help rolling nad getting up onto his arms to get into sitting.  pt doesnt seem to initiate movement - would just stop half way up and stay there.  pt wouldnt scoot forward without max cues.  pt sat EOB - was leaning posterior for first 10 minutes - worked to relax pts back and get pt sitting erect /in neutral.  pt abel to get into sitting and was able to sit EOB (with prostheses on) for 10 more minutes more easily.  talked about pt needing to get to  this position and stretch out his back.  wife says pt doesnt like to ever lean forward (to stretch out back) as he is afraid to fall of teh bed.  Transfers                 General transfer comment: unable to get pt up without mroe assist -did sitting and weight shifting today.  Ambulation/Gait                Stairs            Wheelchair Mobility    Modified Rankin (Stroke Patients Only)       Balance Overall balance assessment: History of Falls                                           Pertinent Vitals/Pain Pain Assessment: No/denies pain    Home Living Family/patient expects to be discharged to:: Private residence Living Arrangements: Spouse/significant other Available Help at Discharge: Family;Available 24 hours/day Type of Home: House Home Access: Ramped entrance     Home Layout: One level Home Equipment: Walker - 2 wheels;Wheelchair - manual;Tub bench;Bedside commode Additional Comments: pt still does sponge bathing.  pt has new power chair but pt cant balance in it so cant use it  Prior Function Level of Independence: Needs assistance   Gait / Transfers Assistance Needed: with with pt all the time.  pt needs some help to do anything.  wife reports he can put on prostheses but looses his balance so seh helps.  pt has had approximatiley 4 falls in last 3 months - doing all different things.             Hand Dominance        Extremity/Trunk Assessment        Lower Extremity Assessment Lower Extremity Assessment: Generalized weakness    Cervical / Trunk Assessment Cervical / Trunk Assessment: Normal  Communication   Communication: No difficulties (pt not talkative, drowsy from meds)  Cognition Arousal/Alertness: Lethargic;Suspect due to medications Behavior During Therapy: Flat affect;WFL for tasks assessed/performed Overall Cognitive Status: History of cognitive impairments - at baseline                  General Comments: wife present and very helpful during eval.  she said it is hard at home.  pt has been in and out of SNF stays, and had several [place] agencies come and work with pt.  pt just continues to remain wobbly on his legs and high fall risk    General Comments General comments (skin integrity, edema, etc.): pt sat EOB about 25 minutes as we talked about home setting etc.  iwfe donned prostheses for pt in sitting.  pts first 10 minutes pt needed assist as he was leaning /fallingposterior.  worked to help him stretch out his back and get more into neutral sitting - last 10 minutes pt abel to sit with less effort.    Exercises     Assessment/Plan    PT Assessment Patient needs continued PT services  PT Problem List Decreased strength;Decreased activity tolerance;Decreased balance;Decreased mobility;Decreased knowledge of use of DME;Decreased safety awareness;Decreased knowledge of precautions;Obesity       PT Treatment Interventions DME instruction;Gait training;Functional mobility training;Therapeutic activities;Therapeutic exercise;Balance training;Patient/family education    PT Goals (Current goals can be found in the Care Plan section)  Acute Rehab PT Goals Patient Stated Goal: to feel better and move around safely PT Goal Formulation: With patient/family Time For Goal Achievement: 11/01/16 Potential to Achieve Goals: Fair    Frequency Min 3X/week   Barriers to discharge   pt slow to move - he has poor balance reactions - this leaves him at high fall risk.  wife is caring for pt but said not going well at home - a lot of work    Co-evaluation               End of Session   Activity Tolerance: Patient limited by lethargy Patient left: in bed;with family/visitor present;with call bell/phone within reach Nurse Communication: Mobility status PT Visit Diagnosis: History of falling (Z91.81);Muscle weakness (generalized) (M62.81);Difficulty in walking, not elsewhere  classified (R26.2)         Time: [id number] PT Time Calculation (min) (ACUTE ONLY): 45 min   Charges:   PT Evaluation $PT Eval Moderate Complexity: 1 Procedure PT Treatments $Therapeutic Activity: 23-37 mins   PT G Codes:         Loyal Buba 10/19/2016, 5:26 PM 10/19/2016   Rande Lawman, PT

## 2016-10-19 NOTE — H&P (Signed)
Chief Complaint: Abdominal pain  Referring Physician(s): Domenic Polite  Supervising Physician: Daryll Brod  Patient Status: [facility] - In-pt  History of Present Illness: [name] [name] is a 71 y.o. male PMH NICM s/p ICD, CHFwith EF to 20-25%, DM-2, CKD, PAF on coumadin, h/o bilateral BKA, admitted to [facility] 10/13/16 with CHF exacerbation.   He has had 2 weeks of increased chest pain and shortness of breath. He was also complaining of abdominal pain and diarrhea at the time.   CT scan of his abdomen showed an umbilical hernia containing only fat. There is a distended GB on CT and [place] confirmed this.  General surgery was consulted to evaluate abdominal pain and feels he likely has cholecystitis.  WBC up to 17.1 from 7.5, but he is afebrile.  We are asked to evaluate him for percutaneous cholecystostomy.  He is NPO. Anticoagulated on warfarin. INR is corrected.   Past Medical History:  Diagnosis Date  . Anemia    takes Iron pill daily  . Arthritis   . Automatic implantable cardioverter-defibrillator in situ    a. 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  . CAD (coronary artery disease)   . Cardiomyopathy, nonischemic ([place])    a. Cath 2003: mild nonobstructive CAD, EF 25% at that time.  . Chronic systolic CHF (congestive heart failure) (HCC)    takes Furosemide daily as well as Aldactone  . CKD (chronic kidney disease), stage III   . Complication of anesthesia   . Constipation    takes Sennokot daily  . Dementia   . GERD (gastroesophageal reflux disease)    takes Nexium daily  . Headache    occasionally  . High cholesterol    takes Atorvastatin daily  . History of blood transfusion    no abnormal reaction noted  . History of bronchitis    > 1 yr ago  . History of kidney stones   . Hypertension    takes Coreg daily  . LBBB (left bundle branch block)    takes Coumadin daily  . NSVT (nonsustained ventricular tachycardia) ([place])    a.  Noted on ICD interrogation in 2011.  [name] PAD (peripheral artery disease) ([place])    a. 08/2013 Periph Angio/PTA: Abd Ao nl, RLE- 3v runoff, PT diff dzs, AT 90p, LLE 2v runoff, PT 100, AT 77m (diamondback ORA/chocolate balloon PTA).  [name] PAF (paroxysmal atrial fibrillation) ([place])    a. Noted on ICD interrogation 2012;  b. coumadin d/c'd 01/2013.  [name] PAF (paroxysmal atrial fibrillation) ([place])   . Peripheral neuropathy (HCC)    hands;numbness and tingling   . Pneumonia    hx of > 27yr ago  . PONV (postoperative nausea and vomiting)   . Type II diabetes mellitus ([place])    takes Levemir daily as well as Novolog  . Urinary frequency   . Urinary urgency     Past Surgical History:  Procedure Laterality Date  . ABDOMINAL ANGIOGRAM  09/12/2013   Procedure: ABDOMINAL ANGIOGRAM;  Surgeon: Lorretta Harp, MD;  Location: [facility] CATH LAB;  Service: Cardiovascular;;  . AMPUTATION Left 10/04/2013   Procedure: LEFT GREAT TOE AND SECOND TOE AMPUTATION;  Surgeon: Marianna Payment, MD;  Location: [place];  Service: Orthopedics;  Laterality: Left;  . AMPUTATION Left 11/09/2013   Procedure: LEFT AMPUTATION BELOW KNEE;  Surgeon: Marianna Payment, MD;  Location: [place];  Service: Orthopedics;  Laterality: Left;  . AMPUTATION Right 01/04/2014   Procedure: Doristine Devoid second and  third toe amputation;  Surgeon: Marianna Payment, MD;  Location: [place];  Service: Orthopedics;  Laterality: Right;  . AMPUTATION Right 01/30/2014   Procedure: RIGHT BELOW KNEE AMPUTATION;  Surgeon: Marianna Payment, MD;  Location: [place];  Service: Orthopedics;  Laterality: Right;  . ATHERECTOMY Right 12/29/2013   Procedure: ATHERECTOMY;  Surgeon: Lorretta Harp, MD;  Location: [facility] CATH LAB;  Service: Cardiovascular;  Laterality: Right;  Anterior Tibial Artery  . CARDIAC CATHETERIZATION  10/01/2001   THERE WAS GLOBAL HYPOKINESIS AND EF 25%. THERE APPEARED TO BE GLOBAL DECREASE IN WALL MOTION  . CARDIAC DEFIBRILLATOR PLACEMENT  06/2009   WITH GENERATOR  REPLACED; BiV ICD  . CARDIOVASCULAR STRESS TEST  03/20/2009   EF 33%  . [place]  . COLONOSCOPY    . ESOPHAGOGASTRODUODENOSCOPY    . HIP ARTHROPLASTY Left 12/28/2015   Procedure: POSTERIOR  APPROACH HEMI HIP ARTHROPLASTY;  Surgeon: Leandrew Koyanagi, MD;  Location: [place];  Service: Orthopedics;  Laterality: Left;  . IR GENERIC HISTORICAL  10/18/2016   IR PERC CHOLECYSTOSTOMY 10/18/2016 [name], MD [facility]-INTERV RAD  . LEG AMPUTATION BELOW KNEE Left 11/09/2013   DR Erlinda Hong  . LITHOTRIPSY  2001  . LOWER EXTREMITY ANGIOGRAM Bilateral 09/12/2013   Procedure: LOWER EXTREMITY ANGIOGRAM;  Surgeon: Lorretta Harp, MD;  Location[facility] C[facility] LAB;  Service: Cardiovascular;  Laterality: Bilateral;  . LOWER EXTREMITY ANGIOGRAM N/A 12/29/2013   Procedure: LOWER EXTREMITY ANGIOGRAM;  Surgeon: Lorretta Harp, MD;  Location: [facility] CATH LAB;  Service: Cardiovascular;  Laterality: N/A;  . STUMP REVISION Left 01/03/2015   Procedure: LEFT BELOW KNEE AMPUTATION REVISION;  Surgeon: Leandrew Koyanagi, MD;  Location: [place];  Service: Orthopedics;  Laterality: Left;  . TOE AMPUTATION  10/04/2013   LEFT GREAT TOE AND 4TH TOE   /   DR Erlinda Hong  . TRANSLUMINAL ATHERECTOMY TIBIAL ARTERY Left 09/12/2013  . US ECHOCARDIOGRAPHY  03/21/2008   EF 30-35%    Allergies: Patient has no known allergies.  Medications: Prior to Admission medications   Medication Sig Start Date End Date Taking? Authorizing Provider  acetaminophen (TYLENOL) 325 MG tablet Take 2 tablets (650 mg total) by mouth every 6 (six) hours as needed for mild pain (or Fever >/= 101). 05/03/16  Yes Maryann Mikhail, DO  aspirin EC 81 MG tablet Take 81 mg by mouth daily.   Yes Historical Provider, MD  atorvastatin (LIPITOR) 10 MG tablet Take 1 tablet (10 mg total) by mouth daily. 01/15/15  Yes Daniel J Angiulli, PA-C  carvedilol (COREG) 12.5 MG tablet Take 1 tablet (12.5 mg total) by mouth 2 (two) times daily with a meal. 01/15/15  Yes Daniel J Angiulli, PA-C    Cholecalciferol (VITAMIN D3) 10000 units TABS Take 1 tablet by mouth daily.   Yes Historical Provider, MD  furosemide (LASIX) 40 MG tablet Take 1 tablet (40 mg total) by mouth daily. Patient taking differently: Take 40-80 mg by mouth daily. Monday,wednesday,friday, Saturday ( 80 mg 4  Days a week ) all other days 40 mg 01/02/16  Yes Theodis Blaze, MD  insulin detemir (LEVEMIR) 100 UNIT/ML injection 25 unit AM and 35 unit PM Patient taking differently: Inject 25-35 Units into the skin See admin instructions. 25 unit AM and 35 unit PM 01/02/16  Yes Theodis Blaze, MD  iron polysaccharides (NIFEREX) 150 MG capsule Take 1 capsule (150 mg total) by mouth daily. 01/15/15  Yes Daniel J Angiulli, PA-C  Multiple Vitamin (  MULTIVITAMIN WITH MINERALS) TABS tablet Take 1 tablet by mouth daily.   Yes Historical Provider, MD  omeprazole (PRILOSEC) 20 MG capsule Take 20 mg by mouth daily.   Yes Historical Provider, MD  pregabalin (LYRICA) 75 MG capsule Take 1 capsule (75 mg total) by mouth 2 (two) times daily. 01/15/15  Yes Daniel J Angiulli, PA-C  sacubitril-valsartan (ENTRESTO) 24-26 MG Take 1 tablet by mouth 2 (two) times daily. 10/31/15  Yes Deboraha Sprang, MD  senna-docusate (SENOKOT-S) 8.6-50 MG per tablet Take 1 tablet by mouth 2 (two) times daily. 11/28/13  Yes Ivan Anchors Love, PA-C  warfarin (COUMADIN) 10 MG tablet Take 10-15 mg by mouth daily. Wednesday, Saturday 15 mg and all other days are 10 mg   Yes Historical Provider, MD  warfarin (COUMADIN) 10 MG tablet TAKE AS DIRECTED BY COUMADIN CLINIC Patient not taking: Reported on 10/13/2016 07/24/16   Deboraha Sprang, MD     Family History  Problem Relation Age of Onset  . Heart disease Mother   . Hypertension Mother   . Diabetes Mother   . Diabetes Father     Social History   Social History  . Marital status: Married    Spouse name: N/A  . Number of children: N/A  . Years of education: N/A   Social History Main Topics  . Smoking status: Never Smoker   . Smokeless tobacco: Never Used  . Alcohol use No  . Drug use: No  . Sexual activity: Not Currently   Other Topics Concern  . None   Social History Narrative  . None    Review of Systems  Unable to perform ROS: Other  Pt uncomfortable  Vital Signs: BP 135/66 (BP Location: Right Arm)   Pulse 84   Temp 98.4 F (36.9 C) (Oral)   Resp 18   Ht 5\' 9"  (1.753 m)   Wt 195 lb 3.2 oz (88.5 kg)   SpO2 91%   BMI 28.83 kg/m   Physical Exam  Constitutional: He is oriented to person, place, and time. He appears well-developed.  HENT:  Head: Normocephalic and atraumatic.  Eyes: EOM are normal.  Neck: Normal range of motion.  Cardiovascular: Normal rate, regular rhythm and normal heart sounds.   Pulmonary/Chest: Effort normal and breath sounds normal.  Abdominal: Soft. There is tenderness.  Musculoskeletal: Normal range of motion.  Neurological: He is alert and oriented to person, place, and time.  Skin: Skin is warm and dry.  Psychiatric: He has a normal mood and affect. His behavior is normal. Judgment and thought content normal.  Vitals reviewed.    Mallampati Score: 2 ASA 3  Imaging: Ct Abdomen Pelvis Wo Contrast  Result Date: 10/17/2016 CLINICAL DATA:  Abdominal pain EXAM: CT ABDOMEN AND PELVIS WITHOUT CONTRAST TECHNIQUE: Multidetector CT imaging of the abdomen and pelvis was performed following the standard protocol without or intravenous contrast material administration. COMPARISON:  October 13, 2016 FINDINGS: Lower chest: There are persistent bilateral pleural effusions with bibasilar airspace consolidation, slightly more on the right than on the left. There is cardiomegaly. Pacemaker lead tips are attached to the right atrium and right ventricle. Visualized pericardium is only minimally thickened. Hepatobiliary: No focal liver lesions are evident on this noncontrast enhanced study. The gallbladder is distended with what appears to represent a combination of small  gallstones and sludge within the gallbladder. Gallbladder wall does not appear thickened by CT. Pancreas: No pancreatic mass or inflammatory focus. Pancreas appears somewhat atrophic. Spleen: No splenic lesions  are evident. Adrenals/Urinary Tract: Adrenals appear normal bilaterally. Kidneys bilaterally show no mass or hydronephrosis on either side. There is no renal or ureteral calculus on either side. Urinary bladder is midline with wall thickness within normal limits. Note that susceptibility artifact from a total hip prosthesis on the left limits lower pelvic assessment. Stomach/Bowel: The rectal wall appears mildly thickened. There is no perirectal stranding or abscess in this area. No fistula. Soft tissue stranding is noted in the region of the cecum and proximal ascending colon. There are similar changes in the mid descending colonic region with localized thickening of the lateral Conal fascia on the right. No diverticular inflammation is seen in this area. No abscess or extraluminal air is seen in this area. There is no other mesenteric inflammation. No bowel obstruction. No free air or portal venous air. Vascular/Lymphatic: There is atherosclerotic calcification in the aorta and iliac arteries. Atherosclerotic calcification is noted more distal pelvic arterial vessels bilaterally. No aneurysm evident. There is moderate calcification in the mesenteric arteries without evident obstruction. No aneurysm is evident in the abdomen or pelvis. Reproductive: Prostate and seminal vesicles appear grossly normal with limitation of visualization of these structures due to total hip replacement on the left causing artifact. Other: The appendix appears normal. No ascites or abscess evident in the abdomen or pelvis. There is a small ventral hernia containing only fat. Musculoskeletal: There are no blastic or lytic bone lesions. Total hip replacement on the left. No intramuscular or abdominal wall lesion. IMPRESSION: Areas  of mesenteric thickening adjacent to the proximal at ascending colon and mid descending colon. No associated diverticular inflammation seen in these areas. Suspect a degree of colitis in these areas. No bowel obstruction. No abscess. Appendix appears normal. Mild wall thickening in the rectum is noted without perirectal stranding or fistula. Suspect a degree of proctitis. This finding may warrant direct physical examination. Gallbladder is distended and contains small gallstones and sludge. Ultrasound of the gallbladder to further evaluate may be warranted given this finding. There are bilateral pleural effusions with patchy bibasilar consolidation. 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 No aneurysm. Small ventral hernia containing only fat. No renal or ureteral calculus.  No hydronephrosis. Status post total hip replacement on the left. Electronically Signed   By: Lowella Grip III M.D.   On: 10/17/2016 10:57   Ct Abdomen Pelvis Wo Contrast  Result Date: 10/13/2016 CLINICAL DATA:  Abdominal pain EXAM: CT ABDOMEN AND PELVIS WITHOUT CONTRAST TECHNIQUE: Multidetector CT imaging of the abdomen and pelvis was performed following the standard protocol without IV contrast. COMPARISON:  02/13/2013 FINDINGS: Lower chest: There is small bilateral pleural effusion. Bilateral lower lobe posterior atelectasis or infiltrate. Partially visualized cardiac pacemaker leads. Cardiomegaly is noted. Hepatobiliary: Unenhanced liver the shows no biliary ductal dilatation. No focal hepatic abnormality. Again noted layering gallbladder sludge or tiny calcified gallstones within gallbladder fundal region. Pancreas: Unenhanced pancreas is atrophic fatty replaced. Stable 1.3 cm hyperdense nodule in the region of pancreatic tail probable accessory splenule. Spleen: Stable small accessory splenules. Unenhanced spleen without focal abnormality. Adrenals/Urinary Tract: No adrenal gland mass.  Unenhanced kidneys shows no nephrolithiasis. No hydronephrosis or hydroureter. No calcified ureteral calculi are noted bilaterally. There is moderate distended urinary bladder. No urinary bladder filling defects. Limited evaluation of the pelvis due to metallic artifacts from left hip prosthesis. Nonspecific mild bilateral perinephric stranding. Stomach/Bowel: No small bowel obstruction. No gastric outlet obstruction. No pericecal inflammation. Normal appendix partially visualized in axial image 53.  Some colonic stool noted in right colon. Some colonic stool and gas noted within transverse colon and descending colon without significant colonic distension. Few diverticula are noted descending colon proximal sigmoid colon. No definite evidence of acute colitis or diverticulitis. Some colonic gas noted proximal sigmoid colon. No distal colonic obstruction. Vascular/Lymphatic: Atherosclerotic calcifications of abdominal aorta and iliac arteries. No aortic aneurysm. Reproductive: No pelvic masses noted. Prostate gland measures 4.3 x 2.9 cm within normal limits. Other: No ascites or free abdominal air. Mild anasarca infiltration of subcutaneous fat bilateral flank wall and anterior abdominal wall. There is a umbilical hernia containing omental fat best seen in sagittal image 93 measures 2 cm without evidence of acute complication. Mild skin thickening and subcutaneous stranding in lower anterior abdominal wall axial image 56 probable mild dermatitis or subcutaneous edema. Musculoskeletal: No destructive bony lesions are noted. There is disc space flattening with mild vacuum disc phenomenon and mild posterior disc bulge at L3-L4 level. There are metallic artifacts from left hip prosthesis. IMPRESSION: 1. Small bilateral pleural effusion. Bilateral lower lobe posterior atelectasis or infiltrate. 2. No nephrolithiasis. No hydronephrosis or hydroureter. Nonspecific mild bilateral perinephric stranding. No calcified ureteral  calculi. 3. There is moderate distended urinary bladder without urinary bladder filling defects. No calcified calculi. Please note the urinary bladder measures at least 17 cm in length cranial caudally. 4. Normal appendix.  No pericecal inflammation. 5. No small bowel or colonic obstruction. Few diverticula are noted descending colon proximal sigmoid colon. No definite evidence of acute colitis or diverticulitis. 6. Limited evaluation of the pelvis due to metallic artifacts from left hip prosthesis. 7. There is disc space flattening with mild vacuum disc phenomenon and mild posterior disc bulge at L2-L3 level. 8. Mild anasarca infiltration of subcutaneous fat abdominal and pelvic wall. 9. There is a umbilical hernia containing omental fat best seen in sagittal image 93 measures 2 cm without evidence of acute complication. Mild skin thickening and subcutaneous stranding in lower anterior abdominal wall axial image 56 probable mild dermatitis or subcutaneous edema. Electronically Signed   By: Lahoma Crocker M.D.   On: 10/13/2016 16:57   Dg Chest 2 View  Result Date: 10/13/2016 CLINICAL DATA:  Dizziness. Chest pain. Shortness of breath. Cough. nausea. EXAM: CHEST  2 VIEW COMPARISON:  05/02/2016 FINDINGS: Chronic mild cardiomegaly. Pacemaker/AICD leads in the same approximate position. Re- demonstration of congestive heart failure with venous hypertension, mild interstitial edema and small effusions with dependent atelectasis. No acute bone finding. IMPRESSION: Re- demonstration of heart failure with interstitial edema and effusions. Electronically Signed   By: Nelson Chimes M.D.   On: 10/13/2016 12:31   Dg Abd 1 View  Result Date: 10/16/2016 CLINICAL DATA:  Nausea. EXAM: ABDOMEN - 1 VIEW COMPARISON:  10/13/2016 . FINDINGS: Soft tissue structures are unremarkable. Distended loops of small and large bowel noted. Findings consist with adynamic ileus. Mild gastric distention also noted. Degenerative changes lumbar  spine. Left hip replacement. IMPRESSION: Distended loops of small and large bowel noted most consistent with adynamic ileus. Mild gastric distention also noted. Follow-up abdominal series suggested to exclude developing small bowel obstruction. Electronically Signed   By: Marcello Moores  Register   On: 10/16/2016 16:06   Ir Perc Cholecystostomy  Result Date: 10/18/2016 INDICATION: ACUTE CHOLECYSTITIS, NON OPERATIVE CANDIDATE EXAM: CHOLECYSTOSTOMY MEDICATIONS: 3.375 g Zosyn; The antibiotic was administered within an appropriate time frame prior to the initiation of the procedure. ANESTHESIA/SEDATION: Moderate (conscious) sedation was employed during this procedure. A total of Versed 0.5 mg  and Fentanyl 25 mcg was administered intravenously. Moderate Sedation Time: 10 minutes. The patient's level of consciousness and vital signs were monitored continuously by radiology nursing throughout the procedure under my direct supervision. FLUOROSCOPY TIME:  Fluoroscopy Time:  18 seconds (3 mGy). COMPLICATIONS: None immediate. PROCEDURE: Informed written consent was obtained from the patient's family after a thorough discussion of the procedural risks, benefits and alternatives. All questions were addressed. Maximal Sterile Barrier Technique was utilized including caps, mask, sterile gowns, sterile gloves, sterile drape, hand hygiene and skin antiseptic. A timeout was performed prior to the initiation of the procedure. Previous imaging reviewed. Preliminary ultrasound performed. Distended thick-walled gallbladder demonstrated in the mid axillary line through a lower intercostal space. Overlying skin marked. Under sterile conditions and local anesthesia, percutaneous transhepatic needle access performed of the distended gallbladder. Needle position confirmed with ultrasound. Images obtained for documentation. There was return of bile. Sample sent for Gram stain and culture. Guidewire advanced easily followed by the Accustick dilator  set. Amplatz guidewire exchange performed. Tract dilatation performed to insert a 10 [place] drain. Retention loop formed in the gallbladder. Position confirmed with a gentle contrast injection. Images obtained for documentation. Catheter secured with a Prolene suture and connected to external gravity drainage bag. Sterile dressing applied. No immediate complication. Patient tolerated the procedure well. IMPRESSION: Successful ultrasound and fluoroscopic 10 French percutaneous transhepatic cholecystostomy. Electronically Signed   By: Jerilynn Mages.  Shick M.D.   On: 10/18/2016 14:03   Dg Chest Port 1 View  Result Date: 10/17/2016 CLINICAL DATA:  Acute respiratory distress EXAM: PORTABLE CHEST 1 VIEW COMPARISON:  10/13/2016 chest radiograph. FINDINGS: Stable configuration of 3 lead left subclavian ICD. Stable cardiomediastinal silhouette with mild cardiomegaly. No pneumothorax. Small bilateral pleural effusions, slightly increased bilaterally. Stable mild pulmonary edema. Bibasilar lung opacities appear increased with associated volume loss. IMPRESSION: 1. Stable mild congestive heart failure. 2. Small bilateral pleural effusions appear slightly increased bilaterally. 3. Worsened bibasilar lung opacities and associated volume loss, favor atelectasis. Electronically Signed   By: Ilona Sorrel M.D.   On: 10/17/2016 10:30   US Abdomen Limited Ruq  Result Date: 10/17/2016 CLINICAL DATA:  Sepsis.  Acute cholecystitis. EXAM: US ABDOMEN LIMITED - RIGHT UPPER QUADRANT COMPARISON:  CT of the abdomen and pelvis the same day. CT of the abdomen 10/13/2016 FINDINGS: Gallbladder: The gallbladder is distended. Wall thickened measuring 9 mm. There is sludge throughout the gallbladder. Common bile duct: Diameter: The common bile duct is within normal limits at 6 mm Liver: No focal lesion identified. Within normal limits in parenchymal echogenicity. IMPRESSION: 1. Distension of the gallbladder with wall thickening 9 mm suggesting  cholecystitis. 2. Diffuse gallbladder sludge. Electronically Signed   By: San Morelle M.D.   On: 10/17/2016 16:52    Labs:  CBC:  Recent Labs  10/17/16 0728 10/17/16 1409 10/18/16 0809 10/19/16 0451  WBC 17.1* 15.7* 15.6* 11.1*  HGB 13.2 13.3 12.1* 13.1  HCT 40.0 40.1 37.3* 40.2  PLT 137* 125* 122* 132*    COAGS:  Recent Labs  12/28/15 0550  10/16/16 0413 10/17/16 0506 10/18/16 0809 10/19/16 0451  INR  --   < > 1.60 2.38 1.30 1.26  APTT 47*  --   --   --   --   --   < > = values in this interval not displayed.  BMP:  Recent Labs  10/17/16 1409 10/17/16 2248 10/18/16 0809 10/19/16 0451  NA 148* 147* 151* 154*  K 3.6 3.6 3.5 3.2*  CL 106  110 110 112*  CO2 29 28 31 31   GLUCOSE 132* 156* 147* 106*  BUN 56* 56* 56* 49*  CALCIUM 8.3* 8.0* 8.3* 8.5*  CREATININE 3.05* 2.56* 2.14* 1.80*  GFRNONAA 19* 23* 29* 36*  GFRAA 22* 27* 34* 41*    LIVER FUNCTION TESTS:  Recent Labs  10/13/16 1213 10/14/16 0402 10/17/16 1409 10/19/16 0451  BILITOT 0.4 0.5 0.8 1.2  AST 26 17 21  120*  ALT 53 39 26 97*  ALKPHOS 71 58 52 80  PROT 6.6 5.7* 6.2* 6.5  ALBUMIN 3.4* 3.0* 2.5* 2.3*    TUMOR MARKERS: No results for input(s): AFPTM, CEA, CA199, CHROMGRNA in the last 8760 hours.  Assessment and Plan:  Acute Cholecystitis  Will plan for perc chole today by Dr. Annamaria Boots  Risks and Benefits discussed with the patient including, but not limited to bleeding, infection, gallbladder perforation, bile leak, sepsis or even death.  All of the patient's questions were answered, patient is agreeable to proceed. Consent signed and in chart.   Electronically Signed: Murrell Redden PA-C 10/19/2016, 11:43 AM   I spent a total of 40 Minutes  in face to face in clinical consultation, greater than 50% of which was counseling/coordinating care for perc chole

## 2016-10-20 DIAGNOSIS — I480 Paroxysmal atrial fibrillation: Secondary | ICD-10-CM

## 2016-10-20 DIAGNOSIS — E118 Type 2 diabetes mellitus with unspecified complications: Secondary | ICD-10-CM

## 2016-10-20 DIAGNOSIS — K819 Cholecystitis, unspecified: Secondary | ICD-10-CM

## 2016-10-20 DIAGNOSIS — N183 Chronic kidney disease, stage 3 unspecified: Secondary | ICD-10-CM

## 2016-10-20 DIAGNOSIS — D696 Thrombocytopenia, unspecified: Secondary | ICD-10-CM

## 2016-10-20 DIAGNOSIS — Z89512 Acquired absence of left leg below knee: Secondary | ICD-10-CM

## 2016-10-20 DIAGNOSIS — D638 Anemia in other chronic diseases classified elsewhere: Secondary | ICD-10-CM

## 2016-10-20 DIAGNOSIS — E870 Hyperosmolality and hypernatremia: Secondary | ICD-10-CM

## 2016-10-20 DIAGNOSIS — D62 Acute posthemorrhagic anemia: Secondary | ICD-10-CM

## 2016-10-20 DIAGNOSIS — I428 Other cardiomyopathies: Secondary | ICD-10-CM

## 2016-10-20 DIAGNOSIS — Z89511 Acquired absence of right leg below knee: Secondary | ICD-10-CM

## 2016-10-20 DIAGNOSIS — N179 Acute kidney failure, unspecified: Secondary | ICD-10-CM

## 2016-10-20 DIAGNOSIS — R109 Unspecified abdominal pain: Secondary | ICD-10-CM

## 2016-10-20 LAB — CBC
HEMATOCRIT: 37.1 % — AB (ref 39.0–52.0)
HEMOGLOBIN: 12.2 g/dL — AB (ref 13.0–17.0)
MCH: 24.7 pg — ABNORMAL LOW (ref 26.0–34.0)
MCHC: 32.9 g/dL (ref 30.0–36.0)
MCV: 75.1 fL — AB (ref 78.0–100.0)
PLATELETS: 119 10*3/uL — AB (ref 150–400)
RBC: 4.94 MIL/uL (ref 4.22–5.81)
RDW: 16.2 % — ABNORMAL HIGH (ref 11.5–15.5)
WBC: 8.9 10*3/uL (ref 4.0–10.5)

## 2016-10-20 LAB — GLUCOSE, CAPILLARY
GLUCOSE-CAPILLARY: 285 mg/dL — AB (ref 65–99)
Glucose-Capillary: 279 mg/dL — ABNORMAL HIGH (ref 65–99)
Glucose-Capillary: 286 mg/dL — ABNORMAL HIGH (ref 65–99)
Glucose-Capillary: 230 mg/dL — ABNORMAL HIGH (ref 65–99)

## 2016-10-20 LAB — BASIC METABOLIC PANEL
Anion gap: 9 (ref 5–15)
BUN: 50 mg/dL — ABNORMAL HIGH (ref 6–20)
CO2: 26 mmol/L (ref 22–32)
CREATININE: 1.89 mg/dL — AB (ref 0.61–1.24)
Calcium: 8 mg/dL — ABNORMAL LOW (ref 8.9–10.3)
Chloride: 110 mmol/L (ref 101–111)
GFR calc non Af Amer: 34 mL/min — ABNORMAL LOW (ref >60)
GFR, EST AFRICAN AMERICAN: 39 mL/min — AB (ref >60)
Glucose, Bld: 306 mg/dL — ABNORMAL HIGH (ref 65–99)
Potassium: 3.9 mmol/L (ref 3.5–5.1)
Sodium: 145 mmol/L (ref 135–145)

## 2016-10-20 LAB — PROTIME-INR
INR: 1.63
Prothrombin Time: 19.5 seconds — ABNORMAL HIGH (ref 11.4–15.2)

## 2016-10-20 MED ORDER — WARFARIN SODIUM 5 MG PO TABS
5.0000 mg | ORAL_TABLET | Freq: Once | ORAL | Status: AC
  Administered 2016-10-20: 5 mg via ORAL
  Filled 2016-10-20: qty 1

## 2016-10-20 NOTE — Progress Notes (Signed)
Patient ID: [name] [name], male   DOB: 08-29-1945, 71 y.o.   MRN: [id number]  [facility] Progress Note     Subjective: Sitting up in bed. States that his abdominal pain is much better. He is tolerating clears and had a good BM this morning. Denies any n/v. Working with therapies who are recommending CIR vs SNF.  Objective: Vital signs in last 24 hours: Temp:  [97.5 F (36.4 C)-99.1 F (37.3 C)] 98.4 F (36.9 C) (02/26 0745) Pulse Rate:  [71-88] 71 (02/26 0745) Resp:  [16-24] 18 (02/26 0745) BP: (114-148)/(57-88) 117/59 (02/26 0514) SpO2:  [90 %-94 %] 94 % (02/26 0745) Weight:  [198 lb 1.6 oz (89.9 kg)] 198 lb 1.6 oz (89.9 kg) (02/26 0514) Last BM Date: 10/19/16  Intake/Output from previous day: 02/25 0701 - 02/26 0700 In: 1998.3 [P.O.:600; I.V.:1248.3; IV Piggyback:150] Out: 590 [Urine:500; Drains:90] Intake/Output this shift: Total I/O In: 360 [P.O.:360] Out: -   PE: Gen:  Alert, NAD, pleasant Pulm:  CTAB, no W/R/R, effort normal Abd: obese, soft, ND, +BS, no HSM, RUQ perc chole tube with minimal bilious fluid in bag Ext:  S/p bilateral BKA  Lab Results:   Recent Labs  10/19/16 0451 10/20/16 0406  WBC 11.1* 8.9  HGB 13.1 12.2*  HCT 40.2 37.1*  PLT 132* 119*   BMET  Recent Labs  10/19/16 0451 10/20/16 0406  NA 154* 145  K 3.2* 3.9  CL 112* 110  CO2 31 26  GLUCOSE 106* 306*  BUN 49* 50*  CREATININE 1.80* 1.89*  CALCIUM 8.5* 8.0*   PT/INR  Recent Labs  10/19/16 0451 10/20/16 0918  LABPROT 15.9* 19.5*  INR 1.26 1.63   CMP     Component Value Date/Time   NA 145 10/20/2016 0406   NA 144 10/07/2016 1031   K 3.9 10/20/2016 0406   CL 110 10/20/2016 0406   CO2 26 10/20/2016 0406   GLUCOSE 306 (H) 10/20/2016 0406   BUN 50 (H) 10/20/2016 0406   BUN 32 (H) 10/07/2016 1031   CREATININE 1.89 (H) 10/20/2016 0406   CREATININE 1.36 (H) 11/14/2015 1024   CALCIUM 8.0 (L) 10/20/2016 0406   PROT 6.5 10/19/2016 0451   ALBUMIN 2.3 (L)  10/19/2016 0451   AST 120 (H) 10/19/2016 0451   ALT 97 (H) 10/19/2016 0451   ALKPHOS 80 10/19/2016 0451   BILITOT 1.2 10/19/2016 0451   GFRNONAA 34 (L) 10/20/2016 0406   GFRAA 39 (L) 10/20/2016 0406   Lipase     Component Value Date/Time   LIPASE 16 02/13/2013 1556       Studies/Results: Ir Perc Cholecystostomy  Result Date: 10/18/2016 INDICATION: ACUTE CHOLECYSTITIS, NON OPERATIVE CANDIDATE EXAM: CHOLECYSTOSTOMY MEDICATIONS: 3.375 g Zosyn; The antibiotic was administered within an appropriate time frame prior to the initiation of the procedure. ANESTHESIA/SEDATION: Moderate (conscious) sedation was employed during this procedure. A total of Versed 0.5 mg and Fentanyl 25 mcg was administered intravenously. Moderate Sedation Time: 10 minutes. The patient's level of consciousness and vital signs were monitored continuously by radiology nursing throughout the procedure under my direct supervision. FLUOROSCOPY TIME:  Fluoroscopy Time:  18 seconds (3 mGy). COMPLICATIONS: None immediate. PROCEDURE: Informed written consent was obtained from the patient's family after a thorough discussion of the procedural risks, benefits and alternatives. All questions were addressed. Maximal Sterile Barrier Technique was utilized including caps, mask, sterile gowns, sterile gloves, sterile drape, hand hygiene and skin antiseptic. A timeout was performed prior to the initiation of the procedure.  Previous imaging reviewed. Preliminary ultrasound performed. Distended thick-walled gallbladder demonstrated in the mid axillary line through a lower intercostal space. Overlying skin marked. Under sterile conditions and local anesthesia, percutaneous transhepatic needle access performed of the distended gallbladder. Needle position confirmed with ultrasound. Images obtained for documentation. There was return of bile. Sample sent for Gram stain and culture. Guidewire advanced easily followed by the Accustick dilator set.  Amplatz guidewire exchange performed. Tract dilatation performed to insert a 10 [place] drain. Retention loop formed in the gallbladder. Position confirmed with a gentle contrast injection. Images obtained for documentation. Catheter secured with a Prolene suture and connected to external gravity drainage bag. Sterile dressing applied. No immediate complication. Patient tolerated the procedure well. IMPRESSION: Successful ultrasound and fluoroscopic 10 French percutaneous transhepatic cholecystostomy. Electronically Signed   By: Jerilynn Mages.  Shick M.D.   On: 10/18/2016 14:03    Anti-infectives: Anti-infectives    Start     Dose/Rate Route Frequency Ordered Stop   10/17/16 0900  piperacillin-tazobactam (ZOSYN) IVPB 3.375 g     3.375 g 12.5 mL/hr over 240 Minutes Intravenous Every 8 hours 10/17/16 0816         Assessment/Plan Cholecystitis - s/p per cholecystostomy 2/24 - culture growing enterococcus durans, susceptibilities pending - drain with 90cc/24hr of bilious output - WBC WNL, afebrile  ID - zosyn 2/23>> FEN - full liquids VTE - lovenox/coumadin  Plan - Advance to full liquids. Continue drain and IV antibiotics. Follow culture/susceptibilities. Continue therapies.   LOS: 7 days    Jerrye Beavers , [facility] 10/20/2016, 10:49 AM Pager: [phone] Consults: [phone] Mon-Fri 7:00 am-4:30 pm Sat-Sun 7:00 am-11:30 am

## 2016-10-20 NOTE — Progress Notes (Signed)
Occupational Therapy Evaluation Patient Details Name: [name] [name] MRN: [id number] DOB: 1946/03/30 Today's Date: 10/20/2016    History of Present Illness [name] [name] is a 71 y.o. male PMH NICM s/p ICD, CHF with EF to 20-25%, DM-2, CKD, PAF on coumadin, h/o bilateral BKA, admitted to [facility] 10/13/16 with CHF exacerbation. .  pt also with sepsis and UTI.  Acute cholecystitis s/p perc GB drain 2/24.   Clinical Impression   PTA, pt lived at home with wife and had assistance for ADL as needed. States he used a RW for mobility with B LE prostheses. Also used w/c for mobility as needed. Pt states he completed his ADL tasks with set up. Pt has had multiple falls recently. Pt currently requires mod A with UB ADL, Max A with LB ADL, Total A with toileting and  +2 with mobility due to below deficits. Feel pt is appropriate for rehab at SNF to maximize functional level of independence. Will follow acutely to address established goals and facilitate DC to next venue of care.     Follow Up Recommendations  SNF;Supervision/Assistance - 24 hour    Equipment Recommendations  None recommended by OT    Recommendations for Other Services       Precautions / Restrictions Precautions Precautions: Fall Precaution Comments: GB drain Required Braces or Orthoses: Other Brace/Splint (pt has bilateral prostheses in his room) Restrictions Weight Bearing Restrictions: No      Mobility Bed Mobility Overal bed mobility: Needs Assistance Bed Mobility: Rolling Rolling: Max assist            Transfers                 General transfer comment: will need +2    Balance Overall balance assessment: History of Falls                                          ADL Overall ADL's : Needs assistance/impaired     Grooming: Moderate assistance   Upper Body Bathing: Moderate assistance;Bed level   Lower Body Bathing: Maximal assistance;Bed level   Upper Body Dressing :  Moderate assistance   Lower Body Dressing: Maximal assistance       Toileting- Clothing Manipulation and Hygiene: Total assistance Toileting - Clothing Manipulation Details (indicate cue type and reason): incontinent     Functional mobility during ADLs: +2 for physical assistance (will need +2 A) General ADL Comments: Pt unaware of being  incontinenet of BM at beginning of session and required max A +2 to roll side to side in bed. Pt had difficulty holding onto bedrail with R hand due to weakness. Pt unable to lift shoulder to initiate rolling. Able to assist with BLE once rolling initiated. Unableto wash face with dominanat RUE.      Vision  will further assess       Perception     Praxis      Pertinent Vitals/Pain Pain Assessment: 0-10 Pain Score: 10-Worst pain ever Pain Location: R UE Pain Descriptors / Indicators: Aching;Constant;Discomfort;Grimacing Pain Intervention(s): Limited activity within patient's tolerance;Repositioned;Patient requesting pain meds-RN notified     Hand Dominance Right - however using L hand as dominant hand at this time   Extremity/Trunk Assessment Upper Extremity Assessment Upper Extremity Assessment: RUE deficits/detail RUE Deficits / Details: Unable to lift RUE off bed. Able to hold at 28 during [place]. unableot cmoplete full  elbow flexion due to cmoplaints of pain. Pt complains of pain in upper arm and @ olecranon. Able to complete copmosite flexionand extension - appears to have trigger finger. unable to oppose thumb to each digit. Nurse notified of RUE weakness RUE Coordination: decreased fine motor;decreased gross motor   Lower Extremity Assessment Lower Extremity Assessment: Generalized weakness;Defer to PT evaluation       Communication Communication Communication: No difficulties (pt not talkative, drowsy from meds)   Cognition Arousal/Alertness: Lethargic;Suspect due to medications Behavior During Therapy: Flat affect Overall  Cognitive Status: History of cognitive impairments - at baseline                 General Comments: Per PT - wife said it is hard at home.  pt has been in and out of SNF stays, and had several [place] agencies come and work with pt.  pt just continues to remain wobbly on his legs and high fall risk   General Comments       Exercises Exercises: Other exercises Other Exercises Other Exercises: encouraged son to complete BUE A/AAROM with his dad to increase RUE ROM in shoulder and elbow Other Exercises: complained of discomfort with L knee extension. Pt keeping B knees in flexion. Educated son on need for terminal extension at times during the day    Shoulder Instructions      Home Living Family/patient expects to be discharged to:: Private residence Living Arrangements: Spouse/significant other Available Help at Discharge: Family;Available 24 hours/day Type of Home: House Home Access: Ramped entrance     Home Layout: One level     Bathroom Shower/Tub:  (pt does sponge baths)         Home Equipment: Walker - 2 wheels;Wheelchair - manual;Tub bench;Bedside commode   Additional Comments: pt still does sponge bathing.  pt has new power chair but pt cant balance in it so cant use it      Prior Functioning/Environment Level of Independence: Needs assistance  Gait / Transfers Assistance Needed: per PT - wife with pt all the time.  pt needs some help to do anything.  wife reports he can put on prostheses but looses his balance so seh helps.  pt has had approximatiley 4 falls in last 3 months - doing all different things.   ADL's / Homemaking Assistance Needed: Pt reports he only takes sponge baths and is able to complete BADLs independenlty. From last admission, pt's brother stays with him while wife is at work and helps him as needed.             OT Problem List: Decreased strength;Decreased range of motion;Decreased activity tolerance;Impaired balance (sitting and/or  standing);Decreased coordination;Decreased knowledge of use of DME or AE;Decreased safety awareness;Decreased cognition;Cardiopulmonary status limiting activity;Obesity;Impaired UE functional use;Pain;Increased edema      OT Treatment/Interventions: Self-care/ADL training;Therapeutic exercise;Neuromuscular education;Energy conservation;DME and/or AE instruction;Therapeutic activities;Cognitive remediation/compensation;Patient/family education;Balance training    OT Goals(Current goals can be found in the care plan section) Acute Rehab OT Goals Patient Stated Goal: for my arm to not hurt OT Goal Formulation: With patient/family Time For Goal Achievement: 11/03/16 Potential to Achieve Goals: Good ADL Goals Pt Will Perform Grooming: with set-up;bed level;with supervision Pt Will Perform Upper Body Bathing: with set-up;with supervision;bed level Pt Will Perform Lower Body Bathing: with mod assist;bed level Additional ADL Goal #1: Complete bed mobility with mod A from supine in preparation for ADL.  OT Frequency: Min 2X/week   Barriers to D/C:  Co-evaluation              End of Session Nurse Communication: Mobility status;Other (comment) (RUE edema/pain and decreased use)  Activity Tolerance: Patient tolerated treatment well Patient left: in bed;with call bell/phone within reach;with family/visitor present  OT Visit Diagnosis: Muscle weakness (generalized) (M62.81)                ADL either performed or assessed with clinical judgement  Time: 1001-1032 OT Time Calculation (min): 31 min Charges:  OT General Charges $OT Visit: 1 Procedure OT Evaluation $OT Eval Moderate Complexity: 1 Procedure OT Treatments $Self Care/Home Management : 8-22 mins G-Codes:     [facility], OT/L  [id number] 10/20/2016  Tomisha Reppucci,HILLARY 10/20/2016, 1:41 PM

## 2016-10-20 NOTE — Progress Notes (Signed)
Patient ID: [name] [name], male   DOB: 03/22/1946, 71 y.o.   MRN: [id number]    Referring Physician(s): Domenic Polite  Supervising Physician: Arne Cleveland  Patient Status: [facility] - In-pt  Chief Complaint: Acute cholecystitis  Subjective: Patient is feeling better today.  Took some liquids this morning with no issues.  Allergies: Patient has no known allergies.  Medications: Prior to Admission medications   Medication Sig Start Date End Date Taking? Authorizing Provider  acetaminophen (TYLENOL) 325 MG tablet Take 2 tablets (650 mg total) by mouth every 6 (six) hours as needed for mild pain (or Fever >/= 101). 05/03/16  Yes Maryann Mikhail, DO  aspirin EC 81 MG tablet Take 81 mg by mouth daily.   Yes Historical Provider, MD  atorvastatin (LIPITOR) 10 MG tablet Take 1 tablet (10 mg total) by mouth daily. 01/15/15  Yes Daniel J Angiulli, PA-C  carvedilol (COREG) 12.5 MG tablet Take 1 tablet (12.5 mg total) by mouth 2 (two) times daily with a meal. 01/15/15  Yes Daniel J Angiulli, PA-C  Cholecalciferol (VITAMIN D3) 10000 units TABS Take 1 tablet by mouth daily.   Yes Historical Provider, MD  furosemide (LASIX) 40 MG tablet Take 1 tablet (40 mg total) by mouth daily. Patient taking differently: Take 40-80 mg by mouth daily. Monday,wednesday,friday, Saturday ( 80 mg 4  Days a week ) all other days 40 mg 01/02/16  Yes Theodis Blaze, MD  insulin detemir (LEVEMIR) 100 UNIT/ML injection 25 unit AM and 35 unit PM Patient taking differently: Inject 25-35 Units into the skin See admin instructions. 25 unit AM and 35 unit PM 01/02/16  Yes Theodis Blaze, MD  iron polysaccharides (NIFEREX) 150 MG capsule Take 1 capsule (150 mg total) by mouth daily. 01/15/15  Yes Daniel J Angiulli, PA-C  Multiple Vitamin (MULTIVITAMIN WITH MINERALS) TABS tablet Take 1 tablet by mouth daily.   Yes Historical Provider, MD  omeprazole (PRILOSEC) 20 MG capsule Take 20 mg by mouth daily.   Yes Historical Provider, MD    pregabalin (LYRICA) 75 MG capsule Take 1 capsule (75 mg total) by mouth 2 (two) times daily. 01/15/15  Yes Daniel J Angiulli, PA-C  sacubitril-valsartan (ENTRESTO) 24-26 MG Take 1 tablet by mouth 2 (two) times daily. 10/31/15  Yes Deboraha Sprang, MD  senna-docusate (SENOKOT-S) 8.6-50 MG per tablet Take 1 tablet by mouth 2 (two) times daily. 11/28/13  Yes Ivan Anchors Love, PA-C  warfarin (COUMADIN) 10 MG tablet Take 10-15 mg by mouth daily. Wednesday, Saturday 15 mg and all other days are 10 mg   Yes Historical Provider, MD  warfarin (COUMADIN) 10 MG tablet TAKE AS DIRECTED BY COUMADIN CLINIC Patient not taking: Reported on 10/13/2016 07/24/16   Deboraha Sprang, MD    Vital Signs: BP (!) 117/59 (BP Location: Right Arm)   Pulse 74   Temp 98.6 F (37 C) (Oral)   Resp 19   Ht 5\' 9"  (1.753 m)   Wt 198 lb 1.6 oz (89.9 kg)   SpO2 97%   BMI 29.25 kg/m   Physical Exam: Abd: soft, NT around drain (just had some pain meds though), drain with bilious output, 90cc/yesterday.  Drain site is c/d/i  Imaging: Ct Abdomen Pelvis Wo Contrast  Result Date: 10/17/2016 CLINICAL DATA:  Abdominal pain EXAM: CT ABDOMEN AND PELVIS WITHOUT CONTRAST TECHNIQUE: Multidetector CT imaging of the abdomen and pelvis was performed following the standard protocol without or intravenous contrast material administration. COMPARISON:  October 13, 2016 FINDINGS:  Lower chest: There are persistent bilateral pleural effusions with bibasilar airspace consolidation, slightly more on the right than on the left. There is cardiomegaly. Pacemaker lead tips are attached to the right atrium and right ventricle. Visualized pericardium is only minimally thickened. Hepatobiliary: No focal liver lesions are evident on this noncontrast enhanced study. The gallbladder is distended with what appears to represent a combination of small gallstones and sludge within the gallbladder. Gallbladder wall does not appear thickened by CT. Pancreas: No pancreatic  mass or inflammatory focus. Pancreas appears somewhat atrophic. Spleen: No splenic lesions are evident. Adrenals/Urinary Tract: Adrenals appear normal bilaterally. Kidneys bilaterally show no mass or hydronephrosis on either side. There is no renal or ureteral calculus on either side. Urinary bladder is midline with wall thickness within normal limits. Note that susceptibility artifact from a total hip prosthesis on the left limits lower pelvic assessment. Stomach/Bowel: The rectal wall appears mildly thickened. There is no perirectal stranding or abscess in this area. No fistula. Soft tissue stranding is noted in the region of the cecum and proximal ascending colon. There are similar changes in the mid descending colonic region with localized thickening of the lateral Conal fascia on the right. No diverticular inflammation is seen in this area. No abscess or extraluminal air is seen in this area. There is no other mesenteric inflammation. No bowel obstruction. No free air or portal venous air. Vascular/Lymphatic: There is atherosclerotic calcification in the aorta and iliac arteries. Atherosclerotic calcification is noted more distal pelvic arterial vessels bilaterally. No aneurysm evident. There is moderate calcification in the mesenteric arteries without evident obstruction. No aneurysm is evident in the abdomen or pelvis. Reproductive: Prostate and seminal vesicles appear grossly normal with limitation of visualization of these structures due to total hip replacement on the left causing artifact. Other: The appendix appears normal. No ascites or abscess evident in the abdomen or pelvis. There is a small ventral hernia containing only fat. Musculoskeletal: There are no blastic or lytic bone lesions. Total hip replacement on the left. No intramuscular or abdominal wall lesion. IMPRESSION: Areas of mesenteric thickening adjacent to the proximal at ascending colon and mid descending colon. No associated diverticular  inflammation seen in these areas. Suspect a degree of colitis in these areas. No bowel obstruction. No abscess. Appendix appears normal. Mild wall thickening in the rectum is noted without perirectal stranding or fistula. Suspect a degree of proctitis. This finding may warrant direct physical examination. Gallbladder is distended and contains small gallstones and sludge. Ultrasound of the gallbladder to further evaluate may be warranted given this finding. There are bilateral pleural effusions with patchy bibasilar consolidation. 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 No aneurysm. Small ventral hernia containing only fat. No renal or ureteral calculus.  No hydronephrosis. Status post total hip replacement on the left. Electronically Signed   By: Lowella Grip III M.D.   On: 10/17/2016 10:57   Dg Abd 1 View  Result Date: 10/16/2016 CLINICAL DATA:  Nausea. EXAM: ABDOMEN - 1 VIEW COMPARISON:  10/13/2016 . FINDINGS: Soft tissue structures are unremarkable. Distended loops of small and large bowel noted. Findings consist with adynamic ileus. Mild gastric distention also noted. Degenerative changes lumbar spine. Left hip replacement. IMPRESSION: Distended loops of small and large bowel noted most consistent with adynamic ileus. Mild gastric distention also noted. Follow-up abdominal series suggested to exclude developing small bowel obstruction. Electronically Signed   By: Marcello Moores  Register   On: 10/16/2016 16:06   Ir Perc  Cholecystostomy  Result Date: 10/18/2016 INDICATION: ACUTE CHOLECYSTITIS, NON OPERATIVE CANDIDATE EXAM: CHOLECYSTOSTOMY MEDICATIONS: 3.375 g Zosyn; The antibiotic was administered within an appropriate time frame prior to the initiation of the procedure. ANESTHESIA/SEDATION: Moderate (conscious) sedation was employed during this procedure. A total of Versed 0.5 mg and Fentanyl 25 mcg was administered intravenously. Moderate Sedation Time: 10 minutes. The  patient's level of consciousness and vital signs were monitored continuously by radiology nursing throughout the procedure under my direct supervision. FLUOROSCOPY TIME:  Fluoroscopy Time:  18 seconds (3 mGy). COMPLICATIONS: None immediate. PROCEDURE: Informed written consent was obtained from the patient's family after a thorough discussion of the procedural risks, benefits and alternatives. All questions were addressed. Maximal Sterile Barrier Technique was utilized including caps, mask, sterile gowns, sterile gloves, sterile drape, hand hygiene and skin antiseptic. A timeout was performed prior to the initiation of the procedure. Previous imaging reviewed. Preliminary ultrasound performed. Distended thick-walled gallbladder demonstrated in the mid axillary line through a lower intercostal space. Overlying skin marked. Under sterile conditions and local anesthesia, percutaneous transhepatic needle access performed of the distended gallbladder. Needle position confirmed with ultrasound. Images obtained for documentation. There was return of bile. Sample sent for Gram stain and culture. Guidewire advanced easily followed by the Accustick dilator set. Amplatz guidewire exchange performed. Tract dilatation performed to insert a 10 [place] drain. Retention loop formed in the gallbladder. Position confirmed with a gentle contrast injection. Images obtained for documentation. Catheter secured with a Prolene suture and connected to external gravity drainage bag. Sterile dressing applied. No immediate complication. Patient tolerated the procedure well. IMPRESSION: Successful ultrasound and fluoroscopic 10 French percutaneous transhepatic cholecystostomy. Electronically Signed   By: Jerilynn Mages.  Shick M.D.   On: 10/18/2016 14:03   Dg Chest Port 1 View  Result Date: 10/17/2016 CLINICAL DATA:  Acute respiratory distress EXAM: PORTABLE CHEST 1 VIEW COMPARISON:  10/13/2016 chest radiograph. FINDINGS: Stable configuration of 3 lead  left subclavian ICD. Stable cardiomediastinal silhouette with mild cardiomegaly. No pneumothorax. Small bilateral pleural effusions, slightly increased bilaterally. Stable mild pulmonary edema. Bibasilar lung opacities appear increased with associated volume loss. IMPRESSION: 1. Stable mild congestive heart failure. 2. Small bilateral pleural effusions appear slightly increased bilaterally. 3. Worsened bibasilar lung opacities and associated volume loss, favor atelectasis. Electronically Signed   By: Ilona Sorrel M.D.   On: 10/17/2016 10:30   US Abdomen Limited Ruq  Result Date: 10/17/2016 CLINICAL DATA:  Sepsis.  Acute cholecystitis. EXAM: US ABDOMEN LIMITED - RIGHT UPPER QUADRANT COMPARISON:  CT of the abdomen and pelvis the same day. CT of the abdomen 10/13/2016 FINDINGS: Gallbladder: The gallbladder is distended. Wall thickened measuring 9 mm. There is sludge throughout the gallbladder. Common bile duct: Diameter: The common bile duct is within normal limits at 6 mm Liver: No focal lesion identified. Within normal limits in parenchymal echogenicity. IMPRESSION: 1. Distension of the gallbladder with wall thickening 9 mm suggesting cholecystitis. 2. Diffuse gallbladder sludge. Electronically Signed   By: San Morelle M.D.   On: 10/17/2016 16:52    Labs:  CBC:  Recent Labs  10/17/16 1409 10/18/16 0809 10/19/16 0451 10/20/16 0406  WBC 15.7* 15.6* 11.1* 8.9  HGB 13.3 12.1* 13.1 12.2*  HCT 40.1 37.3* 40.2 37.1*  PLT 125* 122* 132* 119*    COAGS:  Recent Labs  12/28/15 0550  10/17/16 0506 10/18/16 0809 10/19/16 0451 10/20/16 0918  INR  --   < > 2.38 1.30 1.26 1.63  APTT 47*  --   --   --   --   --   < > =  values in this interval not displayed.  BMP:  Recent Labs  10/17/16 2248 10/18/16 0809 10/19/16 0451 10/20/16 0406  NA 147* 151* 154* 145  K 3.6 3.5 3.2* 3.9  CL 110 110 112* 110  CO2 28 31 31 26   GLUCOSE 156* 147* 106* 306*  BUN 56* 56* 49* 50*  CALCIUM 8.0*  8.3* 8.5* 8.0*  CREATININE 2.56* 2.14* 1.80* 1.89*  GFRNONAA 23* 29* 36* 34*  GFRAA 27* 34* 41* 39*    LIVER FUNCTION TESTS:  Recent Labs  10/13/16 1213 10/14/16 0402 10/17/16 1409 10/19/16 0451  BILITOT 0.4 0.5 0.8 1.2  AST 26 17 21  120*  ALT 53 39 26 97*  ALKPHOS 71 58 52 80  PROT 6.6 5.7* 6.2* 6.5  ALBUMIN 3.4* 3.0* 2.5* 2.3*    Assessment and Plan: 1. Acute cholecystitis, s/p perc chole on 2/24 -drain is stable.  He will need HH to have this flushed daily with 5-10cc/day -he should record output daily and bring it with him to his follow up appointment. -he will return to drain clinic in 5 weeks for a drain injection.  This order will be sent to our office and they will call him with date and time.  Electronically Signed: Henreitta Cea 10/20/2016, 12:29 PM   I spent a total of 15 Minutes at the the patient's bedside AND on the patient's hospital floor or unit, greater than 50% of which was counseling/coordinating care for acute cholecystitis

## 2016-10-20 NOTE — Progress Notes (Addendum)
ANTICOAGULATION CONSULT NOTE - [place] for enoxaparin bridge + warfarin Indication: atrial fibrillation  No Known Allergies  Patient Measurements: Height: 5\' 9"  (175.3 cm) Weight: 198 lb 1.6 oz (89.9 kg) IBW/kg (Calculated) : 70.7 Heparin Dosing Weight: 90 kg  Vital Signs: Temp: 98.4 F (36.9 C) (02/26 0745) Temp Source: Oral (02/26 0745) BP: 117/59 (02/26 0514) Pulse Rate: 71 (02/26 0745)  Labs:  Recent Labs  10/18/16 0809 10/19/16 0451 10/20/16 0406 10/20/16 0918  HGB 12.1* 13.1 12.2*  --   HCT 37.3* 40.2 37.1*  --   PLT 122* 132* 119*  --   LABPROT 16.3* 15.9*  --  19.5*  INR 1.30 1.26  --  1.63  CREATININE 2.14* 1.80* 1.89*  --     Estimated Creatinine Clearance: 38.6 mL/min (by C-G formula based on SCr of 1.89 mg/dL (H)).   Medical History: Past Medical History:  Diagnosis Date  . Anemia    takes Iron pill daily  . Arthritis   . Automatic implantable cardioverter-defibrillator in situ    a. 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  . CAD (coronary artery disease)   . Cardiomyopathy, nonischemic ([place])    a. Cath 2003: mild nonobstructive CAD, EF 25% at that time.  . Chronic systolic CHF (congestive heart failure) (HCC)    takes Furosemide daily as well as Aldactone  . CKD (chronic kidney disease), stage III   . Complication of anesthesia   . Constipation    takes Sennokot daily  . Dementia   . GERD (gastroesophageal reflux disease)    takes Nexium daily  . Headache    occasionally  . High cholesterol    takes Atorvastatin daily  . History of blood transfusion    no abnormal reaction noted  . History of bronchitis    > 1 yr ago  . History of kidney stones   . Hypertension    takes Coreg daily  . LBBB (left bundle branch block)    takes Coumadin daily  . NSVT (nonsustained ventricular tachycardia) ([place])    a. Noted on ICD interrogation in 2011.  [name] PAD (peripheral artery disease) ([place])     a. 08/2013 Periph Angio/PTA: Abd Ao nl, RLE- 3v runoff, PT diff dzs, AT 90p, LLE 2v runoff, PT 100, AT 91m (diamondback ORA/chocolate balloon PTA).  [name] PAF (paroxysmal atrial fibrillation) ([place])    a. Noted on ICD interrogation 2012;  b. coumadin d/c'd 01/2013.  [name] PAF (paroxysmal atrial fibrillation) ([place])   . Peripheral neuropathy (HCC)    hands;numbness and tingling   . Pneumonia    hx of > 56yr ago  . PONV (postoperative nausea and vomiting)   . Type II diabetes mellitus ([place])    takes Levemir daily as well as Novolog  . Urinary frequency   . Urinary urgency     Assessment: 71 yo male on warfarin PTA for Afib, admitted with supratherapeutic INR s/p reversal with vitamin K. Warfarin has been on hold for placement of a percutaneous drain for cholecystitis 2/24. Warfarin restarted 2/25. INR 1.26 > 1.63.   PTA warfarin dose: 10mg  daily except 15mg  on Wed/Sat   Goal of Therapy:  Monitor platelets by anticoagulation protocol: Yes INR 2-3   Plan:  Repeat Warfarin 5 mg PO x1 tonight Lovenox 90 mg SQ q12h Monitor Hgb, Pltc, s/sx bleeding, weight  [name] [name], Pharm.D., BCPS Clinical Pharmacist Pager [phone] 10/20/2016 10:57 AM

## 2016-10-20 NOTE — Progress Notes (Signed)
Patient ID: [name] [name], male   DOB: 29-Aug-1945, 71 y.o.   MRN: [id number]    PROGRESS NOTE  [name] [name]  [id number] DOB: February 13, 1946 DOA: 10/13/2016  PCP: [name] Screws, MD   Brief Narrative:  71 y.o. male with medical history significant of NICM status post ICD, CHF with EF to 20-25% in 2014, DM-2, CKD, PAF on coumadin, bilateral BKA who presented to the ED with c/o left sided throbbing chest pain and SOB during that last two weeks and abd pain. Patient has been seen by his cardiologist and Lasix was increased from 40 to 80 mg twice a day, but despite this measure he has worsening orthopnea and during the last two weeks sleeps on two pillows instead of one as before.  Assessment & Plan:  Sepsis/Acute cholecystitis -finally RUQ [place] 2/23 was suggestive of acute cholecystitis after 2 non diagnostic CT abdomens -CCS following -improving, BP more stable -on IV Zosyn Day 5, change to PO Cipro/flagyl -s/p Perc GB drain by IR 2/24, improving now -resumed anticoagulation-warfarin with lovenox -FU with Dr.Wyatt and in drain clinic  Acute on chronic exacerbation of systolic CHF - was on lasix 80 mg IV BID, now held,  - ECHo with EF of 20-25% and grade 2 diastolic dysfunction - appreciate CHF team for input, - all diuretics and entresto held due to sepsis/hypotension -stop IVF today, resume CHF meds slowly  AKI on CKD - stage III, with hypernatremia -creat around 1.47 at baseline -now up to 3 in setting of sepsis/hypotension, now 1.8 -now stable, stop IVF, stopped aldactone and entresto  Hypertension, essential  - BP low now, see med changes above  URinary retention - also confirmed on CT abd - now with foley, voiding trial prior to discharge, when abd issues better - UA on admission not suggestive of UTI  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  Bilateral BKA - ambulates with prosthesis  DVT prophylaxis: resumed coumadin with lovenox bridge Code Status: Full  Family Communication:none at bedside, d/w wife and daughter 2/22 Disposition Plan: SNF later this week  Consultants:   CHF team  Procedures:   None  Antimicrobials:   None  Subjective: doing better, breathing ok, on clears Objective: Vitals:   10/20/16 0041 10/20/16 0514 10/20/16 0600 10/20/16 0745  BP: (!) 114/57 (!) 117/59    Pulse: 85 81 80 71  Resp: (!) 24 16 19 18   Temp: 99.1 F (37.3 C) 98.8 F (37.1 C)  98.4 F (36.9 C)  TempSrc: Oral Oral  Oral  SpO2: 91% 90% 93% 94%  Weight:  89.9 kg (198 lb 1.6 oz)    Height:        Intake/Output Summary (Last 24 hours) at 10/20/16 1141 Last data filed at 10/20/16 0826  Gross per 24 hour  Intake          2308.25 ml  Output              590 ml  Net          1718.25 ml   Filed Weights   10/18/16 0425 10/19/16 0354 10/20/16 0514  Weight: 89 kg (196 lb 1.6 oz) 88.5 kg (195 lb 3.2 oz) 89.9 kg (198 lb 1.6 oz)   Examination:  General exam: AAOx2 Respiratory system: diminished breath sounds at bases with crackles  Cardiovascular system: S1 & S2 heard, RRR. B BKA Gastrointestinal system: Abdomen is less distended, soft and less tender in RUQ , GB drain noted, umbilical hernia noted-reducible, decreased bowel sounds Central nervous system more alert, . No focal neurological deficits. Psychiatry: unable to assess Ext: B/o BKA  Data Reviewed: I have personally reviewed following labs and imaging studies  CBC:  Recent Labs Lab 10/13/16 1213  10/17/16 0728 10/17/16 1409 10/18/16 0809 10/19/16 0451 10/20/16 0406  WBC 4.4  < > 17.1* 15.7* 15.6* 11.1* 8.9  NEUTROABS 3.4  --   --  14.5*  --   --   --   HGB 11.3*  < > 13.2 13.3 12.1* 13.1 12.2*  HCT 35.0*  < > 40.0 40.1 37.3* 40.2 37.1*  MCV 74.9*  <  > 75.3* 75.2* 75.5* 75.7* 75.1*  PLT 155  < > 137* 125* 122* 132* 119*  < > = values in this interval not displayed. Basic Metabolic Panel:  Recent Labs Lab 10/13/16 1749  10/15/16 1410  10/17/16 1409 10/17/16 2248 10/18/16 0809 10/19/16 0451 10/20/16 0406  NA  --   < >  --   < > 148* 147* 151* 154* 145  K  --   < >  --   < > 3.6 3.6 3.5 3.2* 3.9  CL  --   < >  --   < > 106 110 110 112* 110  CO2  --   < >  --   < > 29 28 31 31 26   GLUCOSE  --   < >  --   < > 132* 156* 147* 106* 306*  BUN  --   < >  --   < > 56* 56* 56* 49* 50*  CREATININE  --   < >  --   < > 3.05* 2.56* 2.14* 1.80* 1.89*  CALCIUM  --   < >  --   < > 8.3* 8.0* 8.3* 8.5* 8.0*  MG 1.9  --  2.0  --   --  2.2  --   --   --   < > = values in this interval not displayed. \Liver Function Tests:  Recent Labs Lab 10/13/16 1213 10/14/16 0402 10/17/16 1409 10/19/16 0451  AST 26 17 21  120*  ALT 53 39 26 97*  ALKPHOS 71 58 52 80  BILITOT 0.4 0.5 0.8 1.2  PROT 6.6 5.7* 6.2* 6.5  ALBUMIN 3.4* 3.0* 2.5* 2.3*   Coagulation Profile:  Recent Labs Lab 10/16/16 0413 10/17/16 0506 10/18/16 0809 10/19/16 0451 10/20/16 0918  INR 1.60 2.38 1.30 1.26 1.63   CBG:  Recent Labs Lab 10/19/16 1141 10/19/16 1643 10/19/16 2054 10/20/16 0733 10/20/16 1133  GLUCAP 140* 268* 268* 279* 285*   Urine analysis:    Component Value Date/Time   COLORURINE STRAW (A) 10/13/2016 1428   APPEARANCEUR CLEAR 10/13/2016 1428   LABSPEC 1.010 10/13/2016 1428   PHURINE 5.0 10/13/2016 1428   GLUCOSEU 50 (A) 10/13/2016 1428   HGBUR MODERATE (A) 10/13/2016 1428   BILIRUBINUR NEGATIVE 10/13/2016 1428   KETONESUR NEGATIVE 10/13/2016 1428   PROTEINUR 30 (A) 10/13/2016 1428   UROBILINOGEN 0.2 01/11/2015 1850   NITRITE NEGATIVE 10/13/2016 1428   LEUKOCYTESUR NEGATIVE 10/13/2016 1428   Radiology Studies: Ct Abdomen Pelvis Wo Contrast Result Date: 10/13/2016 1. Small bilateral pleural effusion. Bilateral lower lobe posterior  atelectasis or infiltrate.  2. No nephrolithiasis.  No hydronephrosis or hydroureter. Nonspecific mild bilateral perinephric stranding. No calcified ureteral calculi.  3. There is moderate distended urinary bladder without urinary bladder filling defects. No calcified calculi. Please note the urinary bladder measures at least 17 cm in length cranial caudally.  4. Normal appendix.  No pericecal inflammation.  5. No small bowel or colonic obstruction. Few diverticula are noted descending colon proximal sigmoid colon. No definite evidence of acute colitis or diverticulitis.  6. Limited evaluation of the pelvis due to metallic artifacts from left hip prosthesis.  7. There is disc space flattening with mild vacuum disc phenomenon and mild posterior disc bulge at L2-L3 level.  8. Mild anasarca infiltration of subcutaneous fat abdominal and pelvic wall.  9. There is a umbilical hernia containing omental fat best seen in sagittal image 93 measures 2 cm without evidence of acute complication. Mild skin thickening and subcutaneous stranding in lower anterior abdominal wall axial image 56 probable mild dermatitis or subcutaneous edema.   Dg Chest 2 View Result Date: 10/13/2016 Re- demonstration of heart failure with interstitial edema and effusions.   Scheduled Meds: . atorvastatin  10 mg Oral Daily  . carvedilol  6.25 mg Oral BID WC  . Chlorhexidine Gluconate Cloth  6 each Topical Q0600  . enoxaparin (LOVENOX) injection  1 mg/kg Subcutaneous Q12H  . insulin aspart  0-9 Units Subcutaneous TID WC  . insulin detemir  10 Units Subcutaneous BID  . mupirocin ointment  1 application Nasal BID  . piperacillin-tazobactam (ZOSYN)  IV  3.375 g Intravenous Q8H  . polyethylene glycol  17 g Oral Daily  . pregabalin  75 mg Oral QHS  . senna-docusate  1 tablet Oral BID  . warfarin  5 mg Oral ONCE-1800  . Warfarin - Pharmacist Dosing Inpatient   Does not apply q1800   Continuous Infusions: . dextrose 75 mL/hr at  10/19/16 0915     LOS: 7 days   Time spent:30 minutes   Domenic Polite, MD [facility] Pager [phone]  If 7PM-7AM, please contact night-coverage [email or web address] Password [facility] 10/20/2016, 11:41 AM

## 2016-10-20 NOTE — Progress Notes (Signed)
Inpatient Diabetes Program Recommendations  AACE/ADA: New Consensus Statement on Inpatient Glycemic Control (2015)  Target Ranges:  Prepandial:   less than 140 mg/dL      Peak postprandial:   less than 180 mg/dL (1-2 hours)      Critically ill patients:  140 - 180 mg/dL   Lab Results  Component Value Date   GLUCAP 279 (H) 10/20/2016   HGBA1C 11.0 (H) 10/15/2016    Review of Glycemic Control  Blood sugars > 180 mg/dL. Needs insulin adjustment.  Inpatient Diabetes Program Recommendations:    Increase Levemir to 15 units bid.  Will continue to follow.  Thank you. Lorenda Peck, RD, LDN, CDE Inpatient Diabetes Coordinator [phone]

## 2016-10-20 NOTE — Progress Notes (Signed)
Pharmacy Antibiotic Note  [name] [name] is a 71 y.o. male admitted on 10/13/2016 and started on Zosyn for cholecystitis s/p perc drain placement 2/24.   Today is day #4 of Zosyn dosing.  Plan: Continue Zosyn 3.375gm IV q8h (4hr infusion)  Height: 5\' 9"  (175.3 cm) Weight: 198 lb 1.6 oz (89.9 kg) IBW/kg (Calculated) : 70.7  Temp (24hrs), Avg:98.5 F (36.9 C), Min:97.5 F (36.4 C), Max:99.1 F (37.3 C)   Recent Labs Lab 10/13/16 1519  10/17/16 0728 10/17/16 0851 10/17/16 1409 10/17/16 2248 10/18/16 0809 10/19/16 0451 10/20/16 0406  WBC  --   < > 17.1*  --  15.7*  --  15.6* 11.1* 8.9  CREATININE  --   < >  --   --  3.05* 2.56* 2.14* 1.80* 1.89*  LATICACIDVEN 1.31  --   --  1.5 1.2  --  1.2  --   --   < > = values in this interval not displayed.  Estimated Creatinine Clearance: 38.6 mL/min (by C-G formula based on SCr of 1.89 mg/dL (H)).    No Known Allergies  Antimicrobials this admission: Zosyn 2/23>>  Dose adjustments this admission:   Microbiology results: 2/24 Abscess >> GPC in pairs, GPRs, GNRs - enterococcus durans - sens pending 2/24 urine >> insig growth 2/23 BCxr >> ngtd 2/23 MRSA positive  Thank you for allowing pharmacy to be a part of this patient's care.  [employer], Pharm.D., BCPS Clinical Pharmacist Pager [phone] 10/20/2016 11:09 AM

## 2016-10-20 NOTE — Progress Notes (Signed)
Advanced Heart Failure Rounding Note  PCP: Dr. Inda Merlin Primary Cardiologist: Dr. Caryl Comes   Subjective:    Admitted on 10/13/16 with SOB, acute on chronic systolic CHF (EF 0000000). Also with abdominal pain and nausea/vomiting. Abdominal US and CT with markedly distended gallbladder suggestive of acute cholecystitis, had percutaneous drain placed on 10/17/16.   Creatinine improved, 3.05->2.56->2.14->1.80->1.89 today. No leukocytosis.   EP saw and his device was reprogrammed to treat VT >130 bpm with ATP.   Awake and alert today, abdominal pain improved. Says he is breathing better, still requiring supplemental o2.    Objective:   Weight Range: 198 lb 1.6 oz (89.9 kg) Body mass index is 29.25 kg/m.   Vital Signs:   Temp:  [97.5 F (36.4 C)-99.1 F (37.3 C)] 98.8 F (37.1 C) (02/26 0514) Pulse Rate:  [80-88] 80 (02/26 0600) Resp:  [16-24] 19 (02/26 0600) BP: (114-148)/(57-88) 117/59 (02/26 0514) SpO2:  [88 %-93 %] 93 % (02/26 0600) Weight:  [198 lb 1.6 oz (89.9 kg)] 198 lb 1.6 oz (89.9 kg) (02/26 0514) Last BM Date: 10/19/16  Weight change: Filed Weights   10/18/16 0425 10/19/16 0354 10/20/16 0514  Weight: 196 lb 1.6 oz (89 kg) 195 lb 3.2 oz (88.5 kg) 198 lb 1.6 oz (89.9 kg)    Intake/Output:   Intake/Output Summary (Last 24 hours) at 10/20/16 0732 Last data filed at 10/20/16 0554  Gross per 24 hour  Intake          1998.25 ml  Output              590 ml  Net          1408.25 ml     Physical Exam: General: Alert, eating breakfast in bed.   HEENT: normal Neck: supple. No JVD. Carotids 2+ bilat; no bruits. No lymphadenopathy or thyromegaly appreciated. Cor: PMI nondisplaced. Regular rate & rhythm. No rubs, gallops or murmurs. Lungs: Crackles in bilateral lower lobes.  Abdomen: soft, mildly tender, nondistended. No hepatosplenomegaly. No bruits or masses. Good bowel sounds. Drain in place.  Extremities: no cyanosis, clubbing, rash. Bilateral BKA. No thigh  edema Neuro: Alert and oriented x 4.   Telemetry: V paced, RBBB  Labs: CBC  Recent Labs  10/17/16 1409  10/19/16 0451 10/20/16 0406  WBC 15.7*  < > 11.1* 8.9  NEUTROABS 14.5*  --   --   --   HGB 13.3  < > 13.1 12.2*  HCT 40.1  < > 40.2 37.1*  MCV 75.2*  < > 75.7* 75.1*  PLT 125*  < > 132* 119*  < > = values in this interval not displayed. Basic Metabolic Panel  Recent Labs  10/17/16 2248  10/19/16 0451 10/20/16 0406  NA 147*  < > 154* 145  K 3.6  < > 3.2* 3.9  CL 110  < > 112* 110  CO2 28  < > 31 26  GLUCOSE 156*  < > 106* 306*  BUN 56*  < > 49* 50*  CREATININE 2.56*  < > 1.80* 1.89*  CALCIUM 8.0*  < > 8.5* 8.0*  MG 2.2  --   --   --   < > = values in this interval not displayed. BNP (last 3 results)  Recent Labs  12/28/15 0550 10/13/16 1401  BNP 380.3* 1,039.0*      Medications:     Scheduled Medications: . atorvastatin  10 mg Oral Daily  . carvedilol  6.25 mg Oral BID WC  .  Chlorhexidine Gluconate Cloth  6 each Topical Q0600  . enoxaparin (LOVENOX) injection  1 mg/kg Subcutaneous Q12H  . insulin aspart  0-9 Units Subcutaneous TID WC  . insulin detemir  10 Units Subcutaneous BID  . mupirocin ointment  1 application Nasal BID  . piperacillin-tazobactam (ZOSYN)  IV  3.375 g Intravenous Q8H  . polyethylene glycol  17 g Oral Daily  . pregabalin  75 mg Oral QHS  . senna-docusate  1 tablet Oral BID  . Warfarin - Pharmacist Dosing Inpatient   Does not apply q1800    PRN Medications: acetaminophen, guaiFENesin-dextromethorphan, morphine injection, ondansetron   Assessment/Plan   1. Acute on chronic combined diastolic and systolic CHF, NYHA class IIIb:  - Coreg resumed yesterday, BP soft with SBP of 114-128.  - Weight up 3 pounds from yesterday, but still getting fluid. Anticipate restarting diuretics tomorrow.  2. PAF, s/p CRT-D: Follows with Dr. Caryl Comes, last seen earlier this month.  He is in NSR today.  - Device was reprogrammed this admission to  lower threshold on VT rate for therapy.  - This patients CHA2DS2-VASc Score and unadjusted Ischemic Stroke Rate (% per year) is equal to 7.2 % stroke rate/year from a score of 5 Above score calculated as 1 point each if present [CHF, HTN, DM, Vascular=MI/PAD/Aortic Plaque, Age if 65-74, or Male], 2 points each if present [Age > 75, or Stroke/TIA/TE] - Continue warfarin.  3. Sepsis: Due to acute cholecystitis, s/p percutaneous gallbladder drain. Enterococcus durans from bile.  Surgery following.  - Getting Zosyn.  - getting 57ml/hour of D5W, he has resumed clear liquids today.  4. CKD stage III:  - baseline 1.40-1.42.  - improved renal function today, creatinine 1.89.  5. Obesity - Counseled about sodium restricted diet.   Length of Stay: [place], NP  10/20/2016, 7:32 AM  Advanced Heart Failure Team Pager [phone] (M-F; 7a - 4p)  Please contact [facility] for night-coverage after hours (4p -7a ) and weekends on [email or web address]  Patient seen with NP< agree with the above note.  Acute cholecystitis with sepsis.  Now s/p percutaneous drain, bile growing Enterococcus durans.  He is on Zosyn.    He remains off cardiac meds except Coreg.  Creatinine starting to trend down.  Eventually will need to restart Entresto, Lasix, and spironolactone.    Now that he is getting clear liquid diet, would stop IV fluid (weight is up).    Loralie Champagne 10/20/2016 9:28 AM

## 2016-10-20 NOTE — Consult Note (Signed)
Physical Medicine and Rehabilitation Consult Reason for Consult: CHF exacerbation with history of bilateral BKA Referring Physician: [facility]   HPI: [name] [name] is a 71 y.o. right handed male with history of NICM status post ICD, diabetes mellitus, PAF on chronic Coumadin, CKD stage III, chronic systolic congestive heart failure with ejection fraction of 25%, peripheral vascular disease with history of bilateral BKA and received inpatient rehabilitation services for right BKA June 2015 as well as inpatient rehabilitation services for left BKA March 2015. Per chart review and patient's wife, patient lives with spouse. Primarily used a wheelchair prior to admission but does have bilateral prosthesis and used a walker for stand pivot transfers. Chart notes approximately 4 falls in the past 3 months. Presented 10/13/2016 with increasing shortness of breath, orthopnea and bouts of diarrhea with abdominal pain. Patient had been treated for pneumonia approximately 2 months ago with a generalized decline since that time. Chest x-ray consistent with CHF. CT of abdomen and pelvis reviewed, showing bilateral lower lobe abnormalities, colitis,and distended gallbladder. Per report, no hydronephrosis. Creatinine 1.60 on admission from baseline approximately 1.34. Lactic acid 1.31. WBC elevated to 17.1 from 7.5. Placed on intravenous Lasix for her suspect CHF exacerbation. Echocardiogram with ejection fraction of 0000000 grade 2 diastolic dysfunction. MRSA PCR screening positive placed on contact precautions. General surgery consulted for abdominal pain suspect cholecystitis and underwent placement of percutaneous cholecystostomy 10/18/2016 per interventional radiology after his chronic Coumadin was reversed with vitamin K. Currently on subcutaneous Lovenox for history of atrial fibrillation weight plan to resume chronic Coumadin. Presently maintained on intravenous Zosyn for any intra-abdominal infection.  Currently on a clear liquid diet advance as tolerated. Physical therapy evaluation completed 10/19/2016 with recommendations of physical medicine rehabilitation consult.   Review of Systems  Constitutional: Negative for chills and fever.  HENT: Negative for hearing loss.   Eyes: Negative for blurred vision and double vision.  Respiratory: Positive for shortness of breath.   Cardiovascular: Negative for chest pain.  Gastrointestinal: Positive for abdominal pain and diarrhea.  Genitourinary: Positive for frequency and urgency. Negative for dysuria and hematuria.  Musculoskeletal: Positive for falls.  Skin: Negative for rash.  Neurological: Positive for headaches.  All other systems reviewed and are negative.  Past Medical History:  Diagnosis Date  . Anemia    takes Iron pill daily  . Arthritis   . Automatic implantable cardioverter-defibrillator in situ    a. 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  . CAD (coronary artery disease)   . Cardiomyopathy, nonischemic ([place])    a. Cath 2003: mild nonobstructive CAD, EF 25% at that time.  . Chronic systolic CHF (congestive heart failure) (HCC)    takes Furosemide daily as well as Aldactone  . CKD (chronic kidney disease), stage III   . Complication of anesthesia   . Constipation    takes Sennokot daily  . Dementia   . GERD (gastroesophageal reflux disease)    takes Nexium daily  . Headache    occasionally  . High cholesterol    takes Atorvastatin daily  . History of blood transfusion    no abnormal reaction noted  . History of bronchitis    > 1 yr ago  . History of kidney stones   . Hypertension    takes Coreg daily  . LBBB (left bundle branch block)    takes Coumadin daily  . NSVT (nonsustained ventricular tachycardia) ([place])    a. Noted on ICD interrogation in  2011.  . PAD (peripheral artery disease) ([place])    a. 08/2013 Periph Angio/PTA: Abd Ao nl, RLE- 3v runoff, PT diff dzs, AT 90p, LLE 2v  runoff, PT 100, AT 67m (diamondback ORA/chocolate balloon PTA).  [name] PAF (paroxysmal atrial fibrillation) ([place])    a. Noted on ICD interrogation 2012;  b. coumadin d/c'd 01/2013.  [name] PAF (paroxysmal atrial fibrillation) ([place])   . Peripheral neuropathy (HCC)    hands;numbness and tingling   . Pneumonia    hx of > 75yr ago  . PONV (postoperative nausea and vomiting)   . Type II diabetes mellitus ([place])    takes Levemir daily as well as Novolog  . Urinary frequency   . Urinary urgency    Past Surgical History:  Procedure Laterality Date  . ABDOMINAL ANGIOGRAM  09/12/2013   Procedure: ABDOMINAL ANGIOGRAM;  Surgeon: Lorretta Harp, MD;  Location: [facility] CATH LAB;  Service: Cardiovascular;;  . AMPUTATION Left 10/04/2013   Procedure: LEFT GREAT TOE AND SECOND TOE AMPUTATION;  Surgeon: Marianna Payment, MD;  Location: [place];  Service: Orthopedics;  Laterality: Left;  . AMPUTATION Left 11/09/2013   Procedure: LEFT AMPUTATION BELOW KNEE;  Surgeon: Marianna Payment, MD;  Location: [place];  Service: Orthopedics;  Laterality: Left;  . AMPUTATION Right 01/04/2014   Procedure: Doristine Devoid second and third toe amputation;  Surgeon: Marianna Payment, MD;  Location: [place];  Service: Orthopedics;  Laterality: Right;  . AMPUTATION Right 01/30/2014   Procedure: RIGHT BELOW KNEE AMPUTATION;  Surgeon: Marianna Payment, MD;  Location: [place];  Service: Orthopedics;  Laterality: Right;  . ATHERECTOMY Right 12/29/2013   Procedure: ATHERECTOMY;  Surgeon: Lorretta Harp, MD;  Location: [facility] CATH LAB;  Service: Cardiovascular;  Laterality: Right;  Anterior Tibial Artery  . CARDIAC CATHETERIZATION  10/01/2001   THERE WAS GLOBAL HYPOKINESIS AND EF 25%. THERE APPEARED TO BE GLOBAL DECREASE IN WALL MOTION  . CARDIAC DEFIBRILLATOR PLACEMENT  06/2009   WITH GENERATOR REPLACED; BiV ICD  . CARDIOVASCULAR STRESS TEST  03/20/2009   EF 33%  . [place]  . COLONOSCOPY    . ESOPHAGOGASTRODUODENOSCOPY    . HIP  ARTHROPLASTY Left 12/28/2015   Procedure: POSTERIOR  APPROACH HEMI HIP ARTHROPLASTY;  Surgeon: Leandrew Koyanagi, MD;  Location: [place];  Service: Orthopedics;  Laterality: Left;  . IR GENERIC HISTORICAL  10/18/2016   IR PERC CHOLECYSTOSTOMY 10/18/2016 [name], MD [facility]-INTERV RAD  . LEG AMPUTATION BELOW KNEE Left 11/09/2013   DR Erlinda Hong  . LITHOTRIPSY  2001  . LOWER EXTREMITY ANGIOGRAM Bilateral 09/12/2013   Procedure: LOWER EXTREMITY ANGIOGRAM;  Surgeon: Lorretta Harp, MD;  Location: [facility] CATH LAB;  Service: Cardiovascular;  Laterality: Bilateral;  . LOWER EXTREMITY ANGIOGRAM N/A 12/29/2013   Procedure: LOWER EXTREMITY ANGIOGRAM;  Surgeon: Lorretta Harp, MD;  Location: [facility] CATH LAB;  Service: Cardiovascular;  Laterality: N/A;  . STUMP REVISION Left 01/03/2015   Procedure: LEFT BELOW KNEE AMPUTATION REVISION;  Surgeon: Leandrew Koyanagi, MD;  Location: [place];  Service: Orthopedics;  Laterality: Left;  . TOE AMPUTATION  10/04/2013   LEFT GREAT TOE AND 4TH TOE   /   DR Erlinda Hong  . TRANSLUMINAL ATHERECTOMY TIBIAL ARTERY Left 09/12/2013  . US ECHOCARDIOGRAPHY  03/21/2008   EF 30-35%   Family History  Problem Relation Age of Onset  . Heart disease Mother   . Hypertension Mother   . Diabetes Mother   . Diabetes Father  Social History:  reports that he has never smoked. He has never used smokeless tobacco. He reports that he does not drink alcohol or use drugs. Allergies: No Known Allergies Medications Prior to Admission  Medication Sig Dispense Refill  . acetaminophen (TYLENOL) 325 MG tablet Take 2 tablets (650 mg total) by mouth every 6 (six) hours as needed for mild pain (or Fever >/= 101).    [name] aspirin EC 81 MG tablet Take 81 mg by mouth daily.    [name] atorvastatin (LIPITOR) 10 MG tablet Take 1 tablet (10 mg total) by mouth daily. 30 tablet 1  . carvedilol (COREG) 12.5 MG tablet Take 1 tablet (12.5 mg total) by mouth 2 (two) times daily with a meal. 60 tablet 1  . Cholecalciferol (VITAMIN D3) 10000 units TABS  Take 1 tablet by mouth daily.    . furosemide (LASIX) 40 MG tablet Take 1 tablet (40 mg total) by mouth daily. (Patient taking differently: Take 40-80 mg by mouth daily. Monday,wednesday,friday, Saturday ( 80 mg 4  Days a week ) all other days 40 mg) 30 tablet 1  . insulin detemir (LEVEMIR) 100 UNIT/ML injection 25 unit AM and 35 unit PM (Patient taking differently: Inject 25-35 Units into the skin See admin instructions. 25 unit AM and 35 unit PM) 10 mL 1  . iron polysaccharides (NIFEREX) 150 MG capsule Take 1 capsule (150 mg total) by mouth daily. 30 capsule 1  . Multiple Vitamin (MULTIVITAMIN WITH MINERALS) TABS tablet Take 1 tablet by mouth daily.    [name] omeprazole (PRILOSEC) 20 MG capsule Take 20 mg by mouth daily.    . pregabalin (LYRICA) 75 MG capsule Take 1 capsule (75 mg total) by mouth 2 (two) times daily. 60 capsule 1  . sacubitril-valsartan (ENTRESTO) 24-26 MG Take 1 tablet by mouth 2 (two) times daily. 60 tablet 6  . senna-docusate (SENOKOT-S) 8.6-50 MG per tablet Take 1 tablet by mouth 2 (two) times daily. 60 tablet 1  . warfarin (COUMADIN) 10 MG tablet Take 10-15 mg by mouth daily. Wednesday, Saturday 15 mg and all other days are 10 mg    . warfarin (COUMADIN) 10 MG tablet TAKE AS DIRECTED BY COUMADIN CLINIC (Patient not taking: Reported on 10/13/2016) 100 tablet 0    Home: Home Living Family/patient expects to be discharged to:: Private residence Living Arrangements: Spouse/significant other Available Help at Discharge: Family, Available 24 hours/day Type of Home: House Home Access: Ramped entrance Home Layout: One level Bathroom Shower/Tub:  (pt does sponge baths) Home Equipment: Walker - 2 wheels, Wheelchair - manual, Tub bench, Bedside commode Additional Comments: pt still does sponge bathing.  pt has new power chair but pt cant balance in it so cant use it  Functional History: Prior Function Level of Independence: Needs assistance Gait / Transfers Assistance Needed: with  with pt all the time.  pt needs some help to do anything.  wife reports he can put on prostheses but looses his balance so seh helps.  pt has had approximatiley 4 falls in last 3 months - doing all different things.   Functional Status:  Mobility: Bed Mobility Overal bed mobility: Needs Assistance Bed Mobility: Rolling, Sidelying to Sit, Sit to Sidelying Rolling: Mod assist Sidelying to sit: Mod assist Sit to sidelying: Mod assist General bed mobility comments: pt needed help rolling nad getting up onto his arms to get into sitting.  pt doesnt seem to initiate movement - would just stop half way up and stay there.  pt  wouldnt scoot forward without max cues.  pt sat EOB - was leaning posterior for first 10 minutes - worked to relax pts back and get pt sitting erect /in neutral.  pt abel to get into sitting and was able to sit EOB (with prostheses on) for 10 more minutes more easily.  talked about pt needing to get to this position and stretch out his back.  wife says pt doesnt like to ever lean forward (to stretch out back) as he is afraid to fall of teh bed. Transfers General transfer comment: unable to get pt up without mroe assist -did sitting and weight shifting today.      ADL:    Cognition: Cognition Overall Cognitive Status: History of cognitive impairments - at baseline Orientation Level: Oriented to person, Oriented to place, Oriented to situation, Disoriented to time Cognition Arousal/Alertness: Lethargic, Suspect due to medications Behavior During Therapy: Flat affect, WFL for tasks assessed/performed Overall Cognitive Status: History of cognitive impairments - at baseline General Comments: wife present and very helpful during eval.  she said it is hard at home.  pt has been in and out of SNF stays, and had several [place] agencies come and work with pt.  pt just continues to remain wobbly on his legs and high fall risk  Blood pressure (!) 117/59, pulse 71, temperature 98.4 F (36.9  C), temperature source Oral, resp. rate 18, height 5\' 9"  (1.753 m), weight 89.9 kg (198 lb 1.6 oz), SpO2 94 %. Physical Exam  Vitals reviewed. Constitutional: He appears well-developed. No distress.  HENT:  Head: Normocephalic and atraumatic.  Eyes: EOM are normal. Right eye exhibits no discharge. Left eye exhibits no discharge.  Neck: Normal range of motion. Neck supple. No thyromegaly present.  Cardiovascular:  Cardiac rate controlled  Respiratory: Effort normal.  Decreased breath sounds at the bases with fair inspiratory effort  GI: Soft. Bowel sounds are normal.  Abdomen mildly distended with cholecystostomy tube in place.  Musculoskeletal: He exhibits edema (RUE) and tenderness.  Neurological: He is alert.  Makes good eye contact with examiner.  Provides his name and age.  Follows simple commands Motor: LUE: 4/5 proximal to distal RUE: 4-/5 proximal to distal (pain inhibition) B/LE: HF, KE 4+/5  Skin: Skin is warm and dry.  Bilateral BKA sites are well-healed  Psychiatric: He has a normal mood and affect. His behavior is normal.    Results for orders placed or performed during the hospital encounter of 10/13/16 (from the past 24 hour(s))  Glucose, capillary     Status: Abnormal   Collection Time: 10/19/16 11:41 AM  Result Value Ref Range   Glucose-Capillary 140 (H) 65 - 99 mg/dL  Glucose, capillary     Status: Abnormal   Collection Time: 10/19/16  4:43 PM  Result Value Ref Range   Glucose-Capillary 268 (H) 65 - 99 mg/dL  Glucose, capillary     Status: Abnormal   Collection Time: 10/19/16  8:54 PM  Result Value Ref Range   Glucose-Capillary 268 (H) 65 - 99 mg/dL  Basic metabolic panel     Status: Abnormal   Collection Time: 10/20/16  4:06 AM  Result Value Ref Range   Sodium 145 135 - 145 mmol/L   Potassium 3.9 3.5 - 5.1 mmol/L   Chloride 110 101 - 111 mmol/L   CO2 26 22 - 32 mmol/L   Glucose, Bld 306 (H) 65 - 99 mg/dL   BUN 50 (H) 6 - 20 mg/dL   Creatinine, Ser  1.89 (H) 0.61 - 1.24 mg/dL   Calcium 8.0 (L) 8.9 - 10.3 mg/dL   GFR calc non Af Amer 34 (L) >60 mL/min   GFR calc Af Amer 39 (L) >60 mL/min   Anion gap 9 5 - 15  CBC     Status: Abnormal   Collection Time: 10/20/16  4:06 AM  Result Value Ref Range   WBC 8.9 4.0 - 10.5 K/uL   RBC 4.94 4.22 - 5.81 MIL/uL   Hemoglobin 12.2 (L) 13.0 - 17.0 g/dL   HCT 37.1 (L) 39.0 - 52.0 %   MCV 75.1 (L) 78.0 - 100.0 fL   MCH 24.7 (L) 26.0 - 34.0 pg   MCHC 32.9 30.0 - 36.0 g/dL   RDW 16.2 (H) 11.5 - 15.5 %   Platelets 119 (L) 150 - 400 K/uL  Glucose, capillary     Status: Abnormal   Collection Time: 10/20/16  7:33 AM  Result Value Ref Range   Glucose-Capillary 279 (H) 65 - 99 mg/dL  Protime-INR     Status: Abnormal   Collection Time: 10/20/16  9:18 AM  Result Value Ref Range   Prothrombin Time 19.5 (H) 11.4 - 15.2 seconds   INR 1.63    Ir Perc Cholecystostomy  Result Date: 10/18/2016 INDICATION: ACUTE CHOLECYSTITIS, NON OPERATIVE CANDIDATE EXAM: CHOLECYSTOSTOMY MEDICATIONS: 3.375 g Zosyn; The antibiotic was administered within an appropriate time frame prior to the initiation of the procedure. ANESTHESIA/SEDATION: Moderate (conscious) sedation was employed during this procedure. A total of Versed 0.5 mg and Fentanyl 25 mcg was administered intravenously. Moderate Sedation Time: 10 minutes. The patient's level of consciousness and vital signs were monitored continuously by radiology nursing throughout the procedure under my direct supervision. FLUOROSCOPY TIME:  Fluoroscopy Time:  18 seconds (3 mGy). COMPLICATIONS: None immediate. PROCEDURE: Informed written consent was obtained from the patient's family after a thorough discussion of the procedural risks, benefits and alternatives. All questions were addressed. Maximal Sterile Barrier Technique was utilized including caps, mask, sterile gowns, sterile gloves, sterile drape, hand hygiene and skin antiseptic. A timeout was performed prior to the initiation of  the procedure. Previous imaging reviewed. Preliminary ultrasound performed. Distended thick-walled gallbladder demonstrated in the mid axillary line through a lower intercostal space. Overlying skin marked. Under sterile conditions and local anesthesia, percutaneous transhepatic needle access performed of the distended gallbladder. Needle position confirmed with ultrasound. Images obtained for documentation. There was return of bile. Sample sent for Gram stain and culture. Guidewire advanced easily followed by the Accustick dilator set. Amplatz guidewire exchange performed. Tract dilatation performed to insert a 10 [place] drain. Retention loop formed in the gallbladder. Position confirmed with a gentle contrast injection. Images obtained for documentation. Catheter secured with a Prolene suture and connected to external gravity drainage bag. Sterile dressing applied. No immediate complication. Patient tolerated the procedure well. IMPRESSION: Successful ultrasound and fluoroscopic 10 French percutaneous transhepatic cholecystostomy. Electronically Signed   By: Jerilynn Mages.  Shick M.D.   On: 10/18/2016 14:03    Assessment/Plan: Diagnosis: CHF exacerbation with history b/l BKA Labs and images independently reviewed.  Records reviewed and summated above.  1. Does the need for close, 24 hr/day medical supervision in concert with the patient's rehab needs make it unreasonable for this patient to be served in a less intensive setting? Yes 2. Co-Morbidities requiring supervision/potential complications: NICM status post ICD (cont meds), diabetes mellitus, uncontrolled at present (Monitor in accordance with exercise and adjust meds as necessary), PAF (cont meds, monitor HR with increased activity),  CKD stage III (avoid nephrotoxic meds), chronic combined congestive heart failure with ejection fraction of 25% (Monitor in accordance with increased physical activity and avoid UE resistance excercises), peripheral vascular  disease (cont meds), history of bilateral BKA cholecystitis (cont meds, advance diet as tolerated), hypernatremia (cont meds, treat as necessary), ABLA (transfuse if necessary to ensure appropriate perfusion for increased activity tolerance), Thrombocytopenia (< 60,000/mm3 no resistive exercise), pain (Biofeedback training with therapies to help reduce reliance on opiate pain medications, particularly IV morphine, monitor pain control during therapies, and sedation at rest and titrate to maximum efficacy to ensure participation and gains in therapies) 3. Due to bladder management, bowel management, safety, disease management, pain management and patient education, does the patient require 24 hr/day rehab nursing? Yes 4. Does the patient require coordinated care of a physician, rehab nurse, PT (1-2 hrs/day, 5 days/week) and OT (1-2 hrs/day, 5 days/week) to address physical and functional deficits in the context of the above medical diagnosis(es)? Yes Addressing deficits in the following areas: balance, endurance, locomotion, strength, transferring, bathing, dressing, toileting and psychosocial support 5. Can the patient actively participate in an intensive therapy program of at least 3 hrs of therapy per day at least 5 days per week? Potentially 6. The potential for patient to make measurable gains while on inpatient rehab is excellent 7. Anticipated functional outcomes upon discharge from inpatient rehab are min assist  with PT, min assist with OT, n/a with SLP. 8. Estimated rehab length of stay to reach the above functional goals is: 14-18 days. 9. Does the patient have adequate social supports and living environment to accommodate these discharge functional goals? No 10. Anticipated D/C setting: Other 11. Anticipated post D/C treatments: SNF 12. Overall Rehab/Functional Prognosis: good  RECOMMENDATIONS: This patient's condition is appropriate for continued rehabilitative care in the following  setting: Pain remains signficantly limiting for patient.  Once pain under better control, anticipate patient will be close to baseline level of functioning.  Further, pt without caregiver assistance at discharge.  If pt continues to require assistance, recommend SNF after completion of medical workup, otherwise HH. Patient has agreed to participate in recommended program. Yes Note that insurance prior authorization may be required for reimbursement for recommended care.  Comment: Rehab Admissions Coordinator to follow up.  Delice Lesch, MD, Mellody Drown Cathlyn Parsons., PA-C 10/20/2016

## 2016-10-20 NOTE — NC FL2 (Deleted)
[place] LEVEL OF CARE SCREENING TOOL     IDENTIFICATION  Patient Name: [name] [name] Birthdate: Apr 22, 1946 Sex: male Admission Date (Current Location): 10/13/2016  [facility] and [place] Number:  Herbalist and Address:         Provider Number: [id number]  Attending Physician Name and Address:  Domenic Polite, MD  Relative Name and Phone Number:       Current Level of Care: Hospital Recommended Level of Care: [place] Prior Approval Number:    Date Approved/Denied:   PASRR Number: [id number]  Discharge Plan: SNF    Current Diagnoses: Patient Active Problem List   Diagnosis Date Noted  . Systolic CHF, acute on chronic ([place]) 10/13/2016  . Diarrhea 10/13/2016  . Acute on chronic systolic congestive heart failure ([place])   . Diabetes mellitus with complication ([place])   . HCAP (healthcare-associated pneumonia) 04/30/2016  . Weakness generalized 04/30/2016  . Status post hip hemiarthroplasty   . Urinary retention   . Dementia   . AKI (acute kidney injury) ([place])   . Thrombocytopenia ([place])   . Anemia of chronic disease   . Closed left hip fracture ([place]) 12/28/2015  . Acute renal failure superimposed on stage 3 chronic kidney disease ([place]) 12/28/2015  . Fall   . Diabetes mellitus with neuropathy ([place]) 01/10/2015  . Amputation of right lower extremity below knee ([place]) 01/08/2015  . Below knee amputation status ([place]) 01/03/2015  . Atrial fibrillation [I48.91] 11/01/2014  . Encounter for therapeutic drug monitoring 11/01/2014  . S/P bilateral BKA (below knee amputation) ([place]) 02/02/2014  . Osteomyelitis of right foot ([place]) 01/30/2014  . 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 ([place]) 01/04/2014  . Osteomyelitis of toe of right foot ([place]) 01/04/2014  . S/P Lt BKA 11/09/13 11/14/2013  . Acute blood loss anemia 11/11/2013  . Type II or unspecified type diabetes mellitus 11/09/2013  . Lower limb amputation, great toe ([place])  10/11/2013  . Lower limb amputation, other toe(s) 10/11/2013  . Chronic osteomyelitis of toe of left foot ([place]) 10/04/2013  . Foot osteomyelitis, left ([place]) 10/04/2013  . Uncontrolled pain, Lt toe 09/21/2013  . Gangrenous toe, Lt toe 09/21/2013  . PAD (peripheral artery disease) ([place]) 09/13/2013  . Diabetes mellitus ([place]) 09/13/2013  . Hyperlipidemia 09/13/2013  . PAF (paroxysmal atrial fibrillation) ([place]) 09/13/2013  . Critical lower limb ischemia- s/p Rt anterior tibial PTA 12/29/13 in preparation for Rt BKA 08/30/2013  . Generalized weakness 02/12/2013  . 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 HTN (hypertension) 04/08/2011  . NICM- EF 20-25% echo 6/14 09/29/2008  . LBBB 09/29/2008  . SYSTOLIC HEART FAILURE, CHRONIC 09/29/2008    Orientation RESPIRATION BLADDER Height & Weight     Self, Situation, Place  O2 (3L) Incontinent Weight: 89.9 kg (198 lb 1.6 oz) Height:  5\' 9"  (175.3 cm)  BEHAVIORAL SYMPTOMS/MOOD NEUROLOGICAL BOWEL NUTRITION STATUS   (NONE)  (NONE) Incontinent Diet (Currently on clear liquids, but advancing)  AMBULATORY STATUS COMMUNICATION OF NEEDS Skin   Extensive Assist Verbally Normal                       Personal Care Assistance Level of Assistance  Bathing, Feeding, Dressing Bathing Assistance: Limited assistance Feeding assistance: Independent Dressing Assistance: Limited assistance     Functional Limitations Info  Sight, Hearing, Speech Sight Info: Adequate Hearing Info: Adequate Speech Info: Adequate    SPECIAL CARE FACTORS FREQUENCY  PT (  By licensed PT), OT (By licensed OT)     PT Frequency: 5/week OT Frequency: 5/week            Contractures Contractures Info: Not present    Additional Factors Info  Code Status, Allergies, Insulin Sliding Scale Code Status Info: Full Code Allergies Info: NKDA   Insulin Sliding Scale Info: 3/day       Current Medications (10/20/2016):  This is the current hospital active  medication list Current Facility-Administered Medications  Medication Dose Route Frequency Provider Last Rate Last Dose  . acetaminophen (TYLENOL) tablet 650 mg  650 mg Oral Q6H PRN Brenton Grills, PA-C      . atorvastatin (LIPITOR) tablet 10 mg  10 mg Oral Daily Brenton Grills, PA-C   10 mg at 10/19/16 [id number]  . carvedilol (COREG) tablet 6.25 mg  6.25 mg Oral BID WC Lelon Perla, MD   6.25 mg at 10/20/16 [id number]  . Chlorhexidine Gluconate Cloth 2 % PADS 6 each  6 each Topical Q0600 Domenic Polite, MD   6 each at 10/20/16 0600  . dextrose 5 % solution   Intravenous Continuous Domenic Polite, MD 75 mL/hr at 10/19/16 0915    . enoxaparin (LOVENOX) injection 90 mg  1 mg/kg Subcutaneous Q12H Carlean Jews, RPH   90 mg at 10/20/16 [id number]  . guaiFENesin-dextromethorphan (ROBITUSSIN DM) 100-10 MG/5ML syrup 5 mL  5 mL Oral Q4H PRN Theodis Blaze, MD      . insulin aspart (novoLOG) injection 0-9 Units  0-9 Units Subcutaneous TID WC Brenton Grills, PA-C   5 Units at 10/20/16 0805  . insulin detemir (LEVEMIR) injection 10 Units  10 Units Subcutaneous BID Domenic Polite, MD   10 Units at 10/19/16 2118  . morphine 2 MG/ML injection 2 mg  2 mg Intravenous Q3H PRN Domenic Polite, MD   2 mg at 10/19/16 1444  . mupirocin ointment (BACTROBAN) 2 % 1 application  1 application Nasal BID Domenic Polite, MD   1 application at [id number]  . ondansetron (ZOFRAN) injection 4 mg  4 mg Intravenous Q6H PRN Rhetta Mura Schorr, NP   4 mg at 10/19/16 1046  . piperacillin-tazobactam (ZOSYN) IVPB 3.375 g  3.375 g Intravenous Q8H Lyndee Leo, RPH   3.375 g at 10/20/16 0230  . polyethylene glycol (MIRALAX / GLYCOLAX) packet 17 g  17 g Oral Daily Domenic Polite, MD   17 g at 10/19/16 0948  . pregabalin (LYRICA) capsule 75 mg  75 mg Oral QHS Domenic Polite, MD   75 mg at 10/19/16 2119  . senna-docusate (Senokot-S) tablet 1 tablet  1 tablet Oral BID Brenton Grills, PA-C   1 tablet at 10/19/16 2119  . Warfarin -  Pharmacist Dosing Inpatient   Does not apply [place], [facility]         Discharge Medications: Please see discharge summary for a list of discharge medications.  Relevant Imaging Results:  Relevant Lab Results:   Additional Information SSN: [id number]  Rigoberto Noel, LCSW

## 2016-10-21 ENCOUNTER — Encounter (HOSPITAL_COMMUNITY): Payer: Medicare Other

## 2016-10-21 ENCOUNTER — Other Ambulatory Visit: Payer: Self-pay | Admitting: Internal Medicine

## 2016-10-21 ENCOUNTER — Inpatient Hospital Stay (HOSPITAL_COMMUNITY): Payer: Medicare Other

## 2016-10-21 DIAGNOSIS — M7989 Other specified soft tissue disorders: Secondary | ICD-10-CM

## 2016-10-21 LAB — GLUCOSE, CAPILLARY
GLUCOSE-CAPILLARY: 204 mg/dL — AB (ref 65–99)
GLUCOSE-CAPILLARY: 270 mg/dL — AB (ref 65–99)
GLUCOSE-CAPILLARY: 234 mg/dL — AB (ref 65–99)
Glucose-Capillary: 203 mg/dL — ABNORMAL HIGH (ref 65–99)

## 2016-10-21 LAB — CBC
HEMATOCRIT: 36.5 % — AB (ref 39.0–52.0)
Hemoglobin: 12 g/dL — ABNORMAL LOW (ref 13.0–17.0)
MCH: 24.4 pg — AB (ref 26.0–34.0)
MCHC: 32.9 g/dL (ref 30.0–36.0)
MCV: 74.2 fL — AB (ref 78.0–100.0)
Platelets: 154 10*3/uL (ref 150–400)
RBC: 4.92 MIL/uL (ref 4.22–5.81)
RDW: 16.5 % — ABNORMAL HIGH (ref 11.5–15.5)
WBC: 9.1 10*3/uL (ref 4.0–10.5)

## 2016-10-21 LAB — COMPREHENSIVE METABOLIC PANEL
ALT: 59 U/L (ref 17–63)
AST: 36 U/L (ref 15–41)
Albumin: 1.9 g/dL — ABNORMAL LOW (ref 3.5–5.0)
Alkaline Phosphatase: 93 U/L (ref 38–126)
Anion gap: 10 (ref 5–15)
BILIRUBIN TOTAL: 0.9 mg/dL (ref 0.3–1.2)
BUN: 42 mg/dL — AB (ref 6–20)
CO2: 29 mmol/L (ref 22–32)
CREATININE: 1.78 mg/dL — AB (ref 0.61–1.24)
Calcium: 8.2 mg/dL — ABNORMAL LOW (ref 8.9–10.3)
Chloride: 108 mmol/L (ref 101–111)
GFR, EST AFRICAN AMERICAN: 42 mL/min — AB (ref >60)
GFR, EST NON AFRICAN AMERICAN: 36 mL/min — AB (ref >60)
Glucose, Bld: 235 mg/dL — ABNORMAL HIGH (ref 65–99)
POTASSIUM: 3.1 mmol/L — AB (ref 3.5–5.1)
Sodium: 147 mmol/L — ABNORMAL HIGH (ref 135–145)
TOTAL PROTEIN: 6 g/dL — AB (ref 6.5–8.1)

## 2016-10-21 LAB — PROTIME-INR
INR: 2.01
Prothrombin Time: 23.1 seconds — ABNORMAL HIGH (ref 11.4–15.2)

## 2016-10-21 LAB — URIC ACID: URIC ACID, SERUM: 7.8 mg/dL — AB (ref 4.4–7.6)

## 2016-10-21 MED ORDER — WARFARIN SODIUM 5 MG PO TABS
5.0000 mg | ORAL_TABLET | Freq: Once | ORAL | Status: AC
  Administered 2016-10-21: 5 mg via ORAL
  Filled 2016-10-21: qty 1

## 2016-10-21 MED ORDER — COLCHICINE 0.6 MG PO TABS
0.6000 mg | ORAL_TABLET | Freq: Once | ORAL | Status: AC
  Administered 2016-10-21: 0.6 mg via ORAL
  Filled 2016-10-21: qty 1

## 2016-10-21 MED ORDER — CIPROFLOXACIN HCL 500 MG PO TABS
500.0000 mg | ORAL_TABLET | Freq: Two times a day (BID) | ORAL | Status: DC
  Administered 2016-10-21 – 2016-10-23 (×5): 500 mg via ORAL
  Filled 2016-10-21 (×5): qty 1

## 2016-10-21 MED ORDER — SACUBITRIL-VALSARTAN 24-26 MG PO TABS
1.0000 | ORAL_TABLET | Freq: Two times a day (BID) | ORAL | Status: DC
  Administered 2016-10-21 – 2016-10-23 (×5): 1 via ORAL
  Filled 2016-10-21 (×6): qty 1

## 2016-10-21 MED ORDER — POTASSIUM CHLORIDE CRYS ER 20 MEQ PO TBCR
40.0000 meq | EXTENDED_RELEASE_TABLET | Freq: Once | ORAL | Status: AC
  Administered 2016-10-21: 40 meq via ORAL
  Filled 2016-10-21: qty 2

## 2016-10-21 MED ORDER — METRONIDAZOLE 500 MG PO TABS
500.0000 mg | ORAL_TABLET | Freq: Three times a day (TID) | ORAL | Status: DC
  Administered 2016-10-21 – 2016-10-23 (×8): 500 mg via ORAL
  Filled 2016-10-21 (×8): qty 1

## 2016-10-21 MED ORDER — GLUCERNA SHAKE PO LIQD
237.0000 mL | Freq: Three times a day (TID) | ORAL | Status: DC
  Administered 2016-10-21 – 2016-10-23 (×5): 237 mL via ORAL

## 2016-10-21 MED ORDER — INSULIN DETEMIR 100 UNIT/ML ~~LOC~~ SOLN
15.0000 [IU] | Freq: Two times a day (BID) | SUBCUTANEOUS | Status: DC
  Administered 2016-10-21 – 2016-10-23 (×5): 15 [IU] via SUBCUTANEOUS
  Filled 2016-10-21 (×6): qty 0.15

## 2016-10-21 NOTE — Progress Notes (Addendum)
Rehab admissions - Please see rehab consult done by Dr. Posey Pronto recommending SNF or [facility] therapies.  He is not recommending inpatient rehab at this time.  Call me for questions.  #[phone]  I met with patient and his wife and explained rehab recommendations.  Patient and wife prefer [facility] in [place] with a private room as their first choice.  I have updated Education officer, museum and Tourist information centre manager.  #[phone]

## 2016-10-21 NOTE — Progress Notes (Signed)
**  Preliminary report by tech**  Right upper extremity venous duplex complete. There is no evidence of deep or superficial vein thrombosis involving the right upper extremity. All visualized vessels appear patent and compressible.   10/21/16 11:37 AM [name] [name] RVT

## 2016-10-21 NOTE — Plan of Care (Signed)
Problem: Physical Regulation: Goal: Ability to maintain clinical measurements within normal limits will improve Outcome: Not Progressing Pt unable to turn in bed or move without max assist  Problem: Activity: Goal: Risk for activity intolerance will decrease Outcome: Not Progressing Pt too weak to help with movement

## 2016-10-21 NOTE — Progress Notes (Signed)
Patient ID: [name] [name], male   DOB: February 05, 1946, 71 y.o.   MRN: [id number]  [facility] Surgery Progress Note     Subjective: Feeling slightly better today. Has not yet worked with PT. States that abdominal pain is well controlled. He had multiple BM's yesterday. Continues to have suppressed appetite; states that PO intake does not cause n/v, but he just does not feel hungry.  Objective: Vital signs in last 24 hours: Temp:  [97.5 F (36.4 C)-100.3 F (37.9 C)] 97.9 F (36.6 C) (02/27 1241) Pulse Rate:  [71-88] 80 (02/27 0626) Resp:  [18-24] 23 (02/27 0626) BP: (123-133)/(60-83) 123/65 (02/27 0626) SpO2:  [92 %-98 %] 96 % (02/27 0626) Weight:  [196 lb 6.4 oz (89.1 kg)] 196 lb 6.4 oz (89.1 kg) (02/27 0626) Last BM Date: 10/20/16  Intake/Output from previous day: 02/26 0701 - 02/27 0700 In: 2106.3 [P.O.:1560; I.V.:396.3; IV Piggyback:150] Out: 1025 [Urine:1025] Intake/Output this shift: No intake/output data recorded.  PE: Gen:  Alert, NAD, pleasant Pulm:  CTAB, no W/R/R, effort normal Abd: obese, soft, ND, +BS, no HSM, RUQ perc chole tube with minimal bilious fluid in bag Ext:  S/p bilateral BKA  Lab Results:   Recent Labs  10/20/16 0406 10/21/16 0453  WBC 8.9 9.1  HGB 12.2* 12.0*  HCT 37.1* 36.5*  PLT 119* 154   BMET  Recent Labs  10/20/16 0406 10/21/16 0453  NA 145 147*  K 3.9 3.1*  CL 110 108  CO2 26 29  GLUCOSE 306* 235*  BUN 50* 42*  CREATININE 1.89* 1.78*  CALCIUM 8.0* 8.2*   PT/INR  Recent Labs  10/20/16 0918 10/21/16 0453  LABPROT 19.5* 23.1*  INR 1.63 2.01   CMP     Component Value Date/Time   NA 147 (H) 10/21/2016 0453   NA 144 10/07/2016 1031   K 3.1 (L) 10/21/2016 0453   CL 108 10/21/2016 0453   CO2 29 10/21/2016 0453   GLUCOSE 235 (H) 10/21/2016 0453   BUN 42 (H) 10/21/2016 0453   BUN 32 (H) 10/07/2016 1031   CREATININE 1.78 (H) 10/21/2016 0453   CREATININE 1.36 (H) 11/14/2015 1024   CALCIUM 8.2 (L) 10/21/2016  0453   PROT 6.0 (L) 10/21/2016 0453   ALBUMIN 1.9 (L) 10/21/2016 0453   AST 36 10/21/2016 0453   ALT 59 10/21/2016 0453   ALKPHOS 93 10/21/2016 0453   BILITOT 0.9 10/21/2016 0453   GFRNONAA 36 (L) 10/21/2016 0453   GFRAA 42 (L) 10/21/2016 0453   Lipase     Component Value Date/Time   LIPASE 16 02/13/2013 1556       Studies/Results: No results found.  Anti-infectives: Anti-infectives    Start     Dose/Rate Route Frequency Ordered Stop   10/21/16 0800  ciprofloxacin (CIPRO) tablet 500 mg     500 mg Oral 2 times daily 10/21/16 0755     10/21/16 0800  metroNIDAZOLE (FLAGYL) tablet 500 mg     500 mg Oral Every 8 hours 10/21/16 0755     10/17/16 0900  piperacillin-tazobactam (ZOSYN) IVPB 3.375 g  Status:  Discontinued     3.375 g 12.5 mL/hr over 240 Minutes Intravenous Every 8 hours 10/17/16 0816 10/21/16 0755       Assessment/Plan Cholecystitis - s/p per cholecystostomy 2/24 - culture growing enterococcus durans, susceptibilities pending - WBC WNL, afebrile  ID - zosyn 2/23>>2/27, cipro/flagyl 2/27>> FEN - soft diet VTE - coumadin  Plan - Continue drain and daily flushes with 5cc NS.  Patient is now on oral cipro/flagyl. Continue therapies. He continues to have suppresses appetite, will add GlucernaTID for nutritional supplement.   Drain to stay in place 6 to 8 weeks. Patient will follow-up with Dr. Hulen Skains and drain clinic outpatient.   LOS: 8 days    Jerrye Beavers , [facility] Surgery 10/21/2016, 1:07 PM Pager: [phone] Consults: [phone] Mon-Fri 7:00 am-4:30 pm Sat-Sun 7:00 am-11:30 am

## 2016-10-21 NOTE — NC FL2 (Signed)
[place] LEVEL OF CARE SCREENING TOOL     IDENTIFICATION  Patient Name: [name] [name] Birthdate: [birth year] Sex: male Admission Date (Current Location): 10/13/2016  [facility] and [place] Number:  Herbalist and Address:         Provider Number: [id number]  Attending Physician Name and Address:  Domenic Polite, MD  Relative Name and Phone Number:       Current Level of Care: Hospital Recommended Level of Care: [place] Prior Approval Number:    Date Approved/Denied:   PASRR Number: [id number]  Discharge Plan: SNF    Current Diagnoses: Patient Active Problem List   Diagnosis Date Noted  . Abdominal pain   . Cholecystitis   . NICM (nonischemic cardiomyopathy) ([place])   . Stage 3 chronic kidney disease   . Hypernatremia   . Systolic CHF, acute on chronic ([place]) 10/13/2016  . Diarrhea 10/13/2016  . Acute on chronic systolic congestive heart failure ([place])   . Diabetes mellitus with complication ([place])   . HCAP (healthcare-associated pneumonia) 04/30/2016  . Weakness generalized 04/30/2016  . Status post hip hemiarthroplasty   . Urinary retention   . Dementia   . AKI (acute kidney injury) ([place])   . Thrombocytopenia ([place])   . Anemia of chronic disease   . Closed left hip fracture ([place]) 12/28/2015  . Acute renal failure superimposed on stage 3 chronic kidney disease ([place]) 12/28/2015  . Fall   . Diabetes mellitus with neuropathy ([place]) 01/10/2015  . Amputation of right lower extremity below knee ([place]) 01/08/2015  . Below knee amputation status ([place]) 01/03/2015  . Atrial fibrillation [I48.91] 11/01/2014  . Encounter for therapeutic drug monitoring 11/01/2014  . S/P bilateral BKA (below knee amputation) ([place]) 02/02/2014  . Osteomyelitis of right foot ([place]) 01/30/2014  . 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 ([place]) 01/04/2014  . Osteomyelitis of toe of right foot ([place]) 01/04/2014  . S/P Lt BKA 11/09/13 11/14/2013  . Acute  blood loss anemia 11/11/2013  . Type II or unspecified type diabetes mellitus 11/09/2013  . Lower limb amputation, great toe ([place]) 10/11/2013  . Lower limb amputation, other toe(s) 10/11/2013  . Chronic osteomyelitis of toe of left foot ([place]) 10/04/2013  . Foot osteomyelitis, left ([place]) 10/04/2013  . Uncontrolled pain, Lt toe 09/21/2013  . Gangrenous toe, Lt toe 09/21/2013  . PAD (peripheral artery disease) ([place]) 09/13/2013  . Diabetes mellitus ([place]) 09/13/2013  . Hyperlipidemia 09/13/2013  . PAF (paroxysmal atrial fibrillation) ([place]) 09/13/2013  . Critical lower limb ischemia- s/p Rt anterior tibial PTA 12/29/13 in preparation for Rt BKA 08/30/2013  . Generalized weakness 02/12/2013  . 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 HTN (hypertension) 04/08/2011  . NICM- EF 20-25% echo 6/14 09/29/2008  . LBBB 09/29/2008  . SYSTOLIC HEART FAILURE, CHRONIC 09/29/2008    Orientation RESPIRATION BLADDER Height & Weight     Self, Situation, Place  O2 (3L) Incontinent Weight: 89.1 kg (196 lb 6.4 oz) Height:  5\' 9"  (175.3 cm)  BEHAVIORAL SYMPTOMS/MOOD NEUROLOGICAL BOWEL NUTRITION STATUS   (NONE)  (NONE) Incontinent Diet (Currently on clear liquids, but advancing)  AMBULATORY STATUS COMMUNICATION OF NEEDS Skin   Extensive Assist Verbally Normal                       Personal Care Assistance Level of Assistance  Bathing, Feeding, Dressing Bathing Assistance: Limited assistance Feeding assistance: Independent Dressing Assistance: Limited assistance  Functional Limitations Info  Sight, Hearing, Speech Sight Info: Adequate Hearing Info: Adequate Speech Info: Adequate    SPECIAL CARE FACTORS FREQUENCY  PT (By licensed PT), OT (By licensed OT)     PT Frequency: 5/week OT Frequency: 5/week            Contractures Contractures Info: Not present    Additional Factors Info  Isolation Precautions Code Status Info: Full Code Allergies Info: NKDA   Insulin  Sliding Scale Info: 3/day Isolation Precautions Info: Contact isolation for MRSA by + pcr 10/17/16.     Current Medications (10/21/2016):  This is the current hospital active medication list Current Facility-Administered Medications  Medication Dose Route Frequency Provider Last Rate Last Dose  . acetaminophen (TYLENOL) tablet 650 mg  650 mg Oral Q6H PRN Brenton Grills, PA-C      . atorvastatin (LIPITOR) tablet 10 mg  10 mg Oral Daily Brenton Grills, PA-C   10 mg at 10/21/16 0900  . carvedilol (COREG) tablet 6.25 mg  6.25 mg Oral BID WC Lelon Perla, MD   6.25 mg at 10/21/16 0901  . Chlorhexidine Gluconate Cloth 2 % PADS 6 each  6 each Topical Q0600 Domenic Polite, MD   6 each at 10/21/16 [phone]  . ciprofloxacin (CIPRO) tablet 500 mg  500 mg Oral BID Domenic Polite, MD   500 mg at 10/21/16 0900  . feeding supplement (GLUCERNA SHAKE) (GLUCERNA SHAKE) liquid 237 mL  237 mL Oral TID BM Brooke A Miller, PA-C      . guaiFENesin-dextromethorphan (ROBITUSSIN DM) 100-10 MG/5ML syrup 5 mL  5 mL Oral Q4H PRN Theodis Blaze, MD      . insulin aspart (novoLOG) injection 0-9 Units  0-9 Units Subcutaneous TID WC Brenton Grills, PA-C   3 Units at 10/21/16 1219  . insulin detemir (LEVEMIR) injection 15 Units  15 Units Subcutaneous BID Domenic Polite, MD   15 Units at 10/21/16 1216  . metroNIDAZOLE (FLAGYL) tablet 500 mg  500 mg Oral Q8H Domenic Polite, MD   500 mg at 10/21/16 0900  . morphine 2 MG/ML injection 2 mg  2 mg Intravenous Q3H PRN Domenic Polite, MD   2 mg at 10/19/16 1444  . mupirocin ointment (BACTROBAN) 2 % 1 application  1 application Nasal BID Domenic Polite, MD   1 application at [id number]  . ondansetron (ZOFRAN) injection 4 mg  4 mg Intravenous Q6H PRN Rhetta Mura Schorr, NP   4 mg at 10/19/16 1046  . pregabalin (LYRICA) capsule 75 mg  75 mg Oral QHS Domenic Polite, MD   75 mg at 10/20/16 2130  . sacubitril-valsartan (ENTRESTO) 24-26 mg per tablet  1 tablet Oral BID Arbutus Leas,  NP   1 tablet at 10/21/16 1219  . warfarin (COUMADIN) tablet 5 mg  5 mg Oral ONCE-1800 Kimberly B Hammons, RPH      . Warfarin - Pharmacist Dosing Inpatient   Does not apply [place], [facility]         Discharge Medications: Please see discharge summary for a list of discharge medications.  Relevant Imaging Results:  Relevant Lab Results:   Additional Information SSN: [id number]  Rigoberto Noel, LCSW

## 2016-10-21 NOTE — Care Management Note (Addendum)
Case Management Note  Patient Details  Name: [name] [name] MRN: [id number] Date of Birth: 16-Apr-1946  Subjective/Objective:  Pt presented for CHF exacerbation and Sepsis. Acute cholecystitis- s/p percutaneous drain. CR increased and lasix held. Plan will be SNF post d/c.                   Action/Plan: CSW following for disposition needs as well as CM to continue to monitor.   Expected Discharge Date:                  Expected Discharge Plan:  Skilled Nursing Facility  In-House Referral:  Clinical Social Work  Discharge planning Services  CM Consult  Post Acute Care Choice:  NA Choice offered to:  NA  DME Arranged:  N/A DME Agency:  NA  HH Arranged:  NA HH Agency:  NA  Status of Service:  Completed, signed off  If discussed at [place] of Stay Meetings, dates discussed:    Additional Comments: 10-23-16 [address] [place] [phone] Plan for d/c today to [facility]. No further needs from CM at this time.  Bethena Roys, RN 10/21/2016, 4:43 PM

## 2016-10-21 NOTE — Clinical Social Work Note (Signed)
Clinical Social Work Assessment  Patient Details  Name: [name] [name] MRN: [id number] Date of Birth: Jan 08, 1946  Date of referral:  10/21/16               Reason for consult:  Facility Placement, Discharge Planning                Permission sought to share information with:  Facility Sport and exercise psychologist, Family Supports Permission granted to share information::  Yes, Verbal Permission Granted  Name::     Museum/gallery curator::  SNFs  Relationship::     Contact Information:     Housing/Transportation Living arrangements for the past 2 months:  [place] of Information:  Spouse, Patient Patient Interpreter Needed:  None Criminal Activity/Legal Involvement Pertinent to Current Situation/Hospitalization:  No - Comment as needed Significant Relationships:  Spouse, Adult Children Lives with:  Spouse Do you feel safe going back to the place where you live?  Yes Need for family participation in patient care:  Yes (Comment)  Care giving concerns:  The patient and wife both report that the patient will need SNF at discharge due to increased care needs.   Social Worker assessment / plan:  CSW met with patient at bedside to complete assessment and discuss discharge planning. The patient and wife share that they both live together at home. The patient's wife is the main support for the patient. The wife and patient share that they would prefer a private room at [facility]. CSW has updated the facility. The patient's wife voices concerns about paying for SNF after the patient's 20 no-copay days run out. CSW explained that the patient will have to pay privately or return home. CSW explained need to apply for Medicaid ASAP if the patient does not have any money to pay for SNF out of pocket. CSW explained SNF search/placement process and answered the patient and wife's questions. CSW will followup with bed offers.  Employment status:  Retired Designer, industrial/product PT Recommendations:  [place] / Referral to community resources:  [place]  Patient/Family's Response to care:  The patient and wife state that they are both appreciative of the care the patient has received  Patient/Family's Understanding of and Emotional Response to Diagnosis, Current Treatment, and Prognosis:  Both the patient and wife appear to have a good understanding of the reason for the patient's admission and his post DC needs. The patient and wife would have liked an inpatient rehab admission but understand that this will not be an option for the patient.   Emotional Assessment Appearance:  Appears stated age Attitude/Demeanor/Rapport:  Other (Patient is appropriate and welcoming of CSW.) Affect (typically observed):  Accepting, Calm Orientation:  Oriented to Self, Oriented to Place, Oriented to Situation Alcohol / Substance use:  Not Applicable Psych involvement (Current and /or in the community):  No (Comment)  Discharge Needs  Concerns to be addressed:  Care Coordination, Discharge Planning Concerns Readmission within the last 30 days:  No Current discharge risk:  Chronically ill, Physical Impairment Barriers to Discharge:  Continued Medical Work up   Rigoberto Noel, LCSW 10/21/2016, 2:36 PM

## 2016-10-21 NOTE — Progress Notes (Signed)
ANTICOAGULATION CONSULT NOTE - Follow-Up Consult  Pharmacy Consult for warfarin Indication: atrial fibrillation  No Known Allergies  Patient Measurements: Height: 5\' 9"  (175.3 cm) Weight: 196 lb 6.4 oz (89.1 kg) IBW/kg (Calculated) : 70.7  Vital Signs: Temp: 97.5 F (36.4 C) (02/27 0855) Temp Source: Oral (02/27 0855) BP: 123/65 (02/27 0626) Pulse Rate: 80 (02/27 0626)  Labs:  Recent Labs  10/19/16 0451 10/20/16 0406 10/20/16 0918 10/21/16 0453  HGB 13.1 12.2*  --  12.0*  HCT 40.2 37.1*  --  36.5*  PLT 132* 119*  --  154  LABPROT 15.9*  --  19.5* 23.1*  INR 1.26  --  1.63 2.01  CREATININE 1.80* 1.89*  --  1.78*    Estimated Creatinine Clearance: 40.8 mL/min (by C-G formula based on SCr of 1.78 mg/dL (H)).   Medical History: Past Medical History:  Diagnosis Date  . Anemia    takes Iron pill daily  . Arthritis   . Automatic implantable cardioverter-defibrillator in situ    a. 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  . CAD (coronary artery disease)   . Cardiomyopathy, nonischemic ([place])    a. Cath 2003: mild nonobstructive CAD, EF 25% at that time.  . Chronic systolic CHF (congestive heart failure) (HCC)    takes Furosemide daily as well as Aldactone  . CKD (chronic kidney disease), stage III   . Complication of anesthesia   . Constipation    takes Sennokot daily  . Dementia   . GERD (gastroesophageal reflux disease)    takes Nexium daily  . Headache    occasionally  . High cholesterol    takes Atorvastatin daily  . History of blood transfusion    no abnormal reaction noted  . History of bronchitis    > 1 yr ago  . History of kidney stones   . Hypertension    takes Coreg daily  . LBBB (left bundle branch block)    takes Coumadin daily  . NSVT (nonsustained ventricular tachycardia) ([place])    a. Noted on ICD interrogation in 2011.  [name] PAD (peripheral artery disease) ([place])    a. 08/2013 Periph Angio/PTA: Abd Ao nl, RLE- 3v  runoff, PT diff dzs, AT 90p, LLE 2v runoff, PT 100, AT 30m (diamondback ORA/chocolate balloon PTA).  [name] PAF (paroxysmal atrial fibrillation) ([place])    a. Noted on ICD interrogation 2012;  b. coumadin d/c'd 01/2013.  [name] PAF (paroxysmal atrial fibrillation) ([place])   . Peripheral neuropathy (HCC)    hands;numbness and tingling   . Pneumonia    hx of > 28yr ago  . PONV (postoperative nausea and vomiting)   . Type II diabetes mellitus ([place])    takes Levemir daily as well as Novolog  . Urinary frequency   . Urinary urgency     Assessment: 71 yo male on warfarin PTA for Afib, admitted with supratherapeutic INR s/p reversal with vitamin K. Warfarin was on hold for placement of a percutaneous drain for cholecystitis 2/24. Warfarin restarted 2/25. INR therapeutic today and Lovenox stopped.  PTA warfarin dose: 10mg  daily except 15mg  on Wed/Sat   Goal of Therapy:  Monitor platelets by anticoagulation protocol: Yes INR 2-3   Plan:  Repeat Warfarin 5 mg PO x1 tonight Monitor Hgb, Pltc, s/sx bleeding, weight  [employer], Pharm.D., BCPS Clinical Pharmacist Pager [phone] 10/21/2016 11:59 AM

## 2016-10-21 NOTE — Progress Notes (Signed)
Physical Therapy Treatment Patient Details Name: [name] [name] MRN: [id number] DOB: [birth year] Today's Date: 10/21/2016    History of Present Illness [name] [name] is a 71 y.o. male PMH NICM s/p ICD, CHF with EF to 20-25%, DM-2, CKD, PAF on coumadin, h/o bilateral BKA, admitted to [facility] 10/13/16 with CHF exacerbation. .  pt also with sepsis and UTI.  Acute cholecystitis s/p perc GB drain 2/24.    PT Comments    Pt continues to have impairments with decreased attention, difficulty following commands, and functional mobility, requiring maxA +2 for bed mobility and sitting balance. Dependent for don/doffing prostheses. Attempted to stand with RW, but unable to achieve full upright with maxA +2. Pt slowly progressing towards goals, will continue to follow acutely. D/c recommendation changed to SNF due to lack of progression with functional mobility and decreased activity tolerance.   Follow Up Recommendations  SNF     Equipment Recommendations       Recommendations for Other Services       Precautions / Restrictions Precautions Precautions: Fall Precaution Comments: GB drain Required Braces or Orthoses: Other Brace/Splint (Pt has bilat BKA prostheses in room) Restrictions Weight Bearing Restrictions: No    Mobility  Bed Mobility Overal bed mobility: Needs Assistance Bed Mobility: Rolling;Sidelying to Sit Rolling: Max assist Sidelying to sit: Max assist;+2 for physical assistance     Sit to sidelying: +2 for physical assistance;Max assist General bed mobility comments: Pt slow to intiate movement, requiring increased time and multiple verbal/physical cues to bring arm over for rolling and to push through into sitting. Would pause during movement and require increased cues to finish sitting up. Rolled R-L for pericare with modA for pelvic roll and use of hand rail.    Transfers Overall transfer level: Needs assistance Equipment used: Rolling walker (2  wheeled) Transfers: Sit to/from Stand Sit to Stand: Max assist;+2 physical assistance         General transfer comment: Unable to stand fully upright with maxA +2 and RW.   Ambulation/Gait                 Stairs            Wheelchair Mobility    Modified Rankin (Stroke Patients Only)       Balance Overall balance assessment: Needs assistance;History of Falls Sitting-balance support: Bilateral upper extremity supported;Feet supported Sitting balance-Leahy Scale: Zero Sitting balance - Comments: Pt sat EOB >10 minutes with mod-maxA for trunk support. Significant L lateral lean throughout, requiring multiple verbal/tactile cues to lean to R side to achieve upright posture, but unable to maintain this. Unable to maintain balance to don bilat prostheses, so PT donned these.  Postural control: Left lateral lean                          Cognition Arousal/Alertness: Lethargic Behavior During Therapy: Flat affect Overall Cognitive Status: History of cognitive impairments - at baseline Area of Impairment: Attention;Following commands;Problem solving;Awareness   Current Attention Level: Sustained   Following Commands: Follows one step commands with increased time   Awareness: Emergent Problem Solving: Slow processing;Decreased initiation;Requires verbal cues;Requires tactile cues      Exercises      General Comments        Pertinent Vitals/Pain Pain Assessment: Faces Faces Pain Scale: Hurts whole lot Pain Location: R hand and UE Pain Descriptors / Indicators: Discomfort;Grimacing;Constant Pain Intervention(s): Limited activity within patient's tolerance;Repositioned;Monitored during session  Home Living                      Prior Function            PT Goals (current goals can now be found in the care plan section) Progress towards PT goals: Progressing toward goals (Slowly)    Frequency    Min 2X/week      PT Plan  Discharge plan needs to be updated    Co-evaluation             End of Session   Activity Tolerance: Patient limited by lethargy Patient left: in bed;with call bell/phone within reach Nurse Communication: Mobility status PT Visit Diagnosis: History of falling (Z91.81);Muscle weakness (generalized) (M62.81);Difficulty in walking, not elsewhere classified (R26.2)     Time: 1350-1420 PT Time Calculation (min) (ACUTE ONLY): 30 min  Charges:  $Therapeutic Activity: 23-37 mins                    G Codes:      Enis Gash, SPT Office-[phone]  Mabeline Caras 10/21/2016, 5:01 PM

## 2016-10-21 NOTE — Clinical Social Work Placement (Signed)
   CLINICAL SOCIAL WORK PLACEMENT  NOTE  Date:  10/21/2016  Patient Details  Name: [name] [name] MRN: [id number] Date of Birth: 1946/08/23  Clinical Social Work is seeking post-discharge placement for this patient at the [place] level of care (*CSW will initial, date and re-position this form in  chart as items are completed):  Yes   Patient/family provided with [facility] Work Department's list of facilities offering this level of care within the geographic area requested by the patient (or if unable, by the patient's family).  Yes   Patient/family informed of their freedom to choose among providers that offer the needed level of care, that participate in Medicare, Medicaid or managed care program needed by the patient, have an available bed and are willing to accept the patient.  Yes   Patient/family informed of [facility]'s ownership interest in [facility] and [facility], as well as of the fact that they are under no obligation to receive care at these facilities.  PASRR submitted to EDS on       PASRR number received on       Existing PASRR number confirmed on 10/21/16     FL2 transmitted to all facilities in geographic area requested by pt/family on 10/21/16     FL2 transmitted to all facilities within larger geographic area on       Patient informed that his/her managed care company has contracts with or will negotiate with certain facilities, including the following:        Yes   Patient/family informed of bed offers received.  Patient chooses bed at [place], [place]     Physician recommends and patient chooses bed at      Patient to be transferred to [place], [place] on  .  Patient to be transferred to facility by       Patient family notified on   of transfer.  Name of family member notified:        PHYSICIAN Please prepare priority discharge summary, including medications, Please prepare  prescriptions, Please sign FL2     Additional Comment:    _______________________________________________ Rigoberto Noel, LCSW 10/21/2016, 2:43 PM

## 2016-10-21 NOTE — Progress Notes (Signed)
Patient ID: [name] [name], male   DOB: 1945/11/18, 71 y.o.   MRN: [id number]    PROGRESS NOTE  [name] [name]  [id number] DOB: May 06, 1946 DOA: 10/13/2016  PCP: [name], MD   Brief Narrative:  71 y.o. male with medical history significant of NICM status post ICD, CHF with EF to 20-25% in 2014, DM-2, CKD, PAF on coumadin, bilateral BKA who presented to the ED with c/o dyspnea, chest pain and abd pain, found to have CHF and Unremarkable CT abd. Then diuresed, developed sepsis from abd source, after extensive workup again finally noted to have Acute cholecystitis, CCS consulted, s/p perc Gb drain, now improving  Assessment & Plan:  Sepsis/Acute cholecystitis -finally RUQ [place] 2/23 was suggestive of acute cholecystitis after 2 non diagnostic CT abdomens -CCS consulted, s/p 5days of IV Zosyn, will change to Po Cipro/Flagyl today, continue this for 57more days atleast -Fluid culture: Enterococcus-await sensitivities, gram stain polymicrobial -improving, advance diet -resumed anticoagulation-warfarin with lovenox -FU with Dr.Wyatt and in drain clinic/Radiology  Acute on chronic exacerbation of systolic CHF - was on lasix 80 mg IV BID, then held due to AKI/sepsis - ECHo with EF of 20-25% and grade 2 diastolic dysfunction - CHF team following - all diuretics and entresto held due to sepsis/hypotension - now off IVF, back on Coreg, plan to resume Entresto per CHF team  AKI on CKD - stage III, with hypernatremia -creat around 1.47 at baseline -went up to 3 in setting of sepsis/hypotension, now down to 1.7 -improving, stopped IVF, diuretics on hold -cards restarting Entresto today, FU bmet in am  Hypertension, essential  - BP low now, see med changes above  URinary retention - also confirmed on CT abd - now with foley, voiding trial prior to discharge, when abd issues better - UA on admission not suggestive of UTI  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  Bilateral BKA - ambulates with prosthesis  DVT prophylaxis: resumed coumadin with lovenox bridge Code Status: Full  Family Communication:none at bedside, d/w wife and daughter 2/25 Disposition Plan: SNF later this week  Consultants:   CHF team  IR  CCS  Procedures:   PErc GB drain 2/24 by Dr.Shick  Antimicrobials:   Zosyn x5days   2/27: Cipro/Fl;agyl  Subjective: doing better, breathing ok, tolerating liq, having Bms now Objective: Vitals:   10/20/16 2135 10/21/16 0000 10/21/16 0626 10/21/16 0855  BP: 133/79 127/60 123/65   Pulse: 87 81 80   Resp: (!) 23 (!) 24 (!) 23   Temp: 99 F (37.2 C) 99 F (37.2 C) 98.6 F (37 C) 97.5 F (36.4 C)  TempSrc: Oral Oral Oral Oral  SpO2: 96% 94% 96%   Weight:   89.1 kg (196 lb 6.4 oz)   Height:        Intake/Output Summary (Last 24 hours) at 10/21/16 1057 Last data filed at 10/21/16 0600  Gross per 24 hour  Intake          1746.25 ml  Output             1025 ml  Net           721.25 ml   Filed Weights   10/19/16  0354 10/20/16 0514 10/21/16 0626  Weight: 88.5 kg (195 lb 3.2 oz) 89.9 kg (198 lb 1.6 oz) 89.1 kg (196 lb 6.4 oz)   Examination:  General exam: AAOx2 Respiratory system: diminished breath sounds at bases  crackles  Cardiovascular system: S1 & S2 heard, RRR. B BKA Gastrointestinal system: Abdomen is less distended, soft and less tender in RUQ , GB drain noted, umbilical hernia noted-reducible, decreased bowel sounds Central nervous system more alert, . No focal neurological deficits. Psychiatry: pleasant, alert Ext: B/o BKA  Data Reviewed: I have personally reviewed following labs and imaging studies  CBC:  Recent Labs Lab 10/17/16 1409 10/18/16 0809  10/19/16 0451 10/20/16 0406 10/21/16 0453  WBC 15.7* 15.6* 11.1* 8.9 9.1  NEUTROABS 14.5*  --   --   --   --   HGB 13.3 12.1* 13.1 12.2* 12.0*  HCT 40.1 37.3* 40.2 37.1* 36.5*  MCV 75.2* 75.5* 75.7* 75.1* 74.2*  PLT 125* 122* 132* 119* [id number]   Basic Metabolic Panel:  Recent Labs Lab 10/15/16 1410  10/17/16 2248 10/18/16 0809 10/19/16 0451 10/20/16 0406 10/21/16 0453  NA  --   < > 147* 151* 154* 145 147*  K  --   < > 3.6 3.5 3.2* 3.9 3.1*  CL  --   < > 110 110 112* 110 108  CO2  --   < > 28 31 31 26 29   GLUCOSE  --   < > 156* 147* 106* 306* 235*  BUN  --   < > 56* 56* 49* 50* 42*  CREATININE  --   < > 2.56* 2.14* 1.80* 1.89* 1.78*  CALCIUM  --   < > 8.0* 8.3* 8.5* 8.0* 8.2*  MG 2.0  --  2.2  --   --   --   --   < > = values in this interval not displayed. \Liver Function Tests:  Recent Labs Lab 10/17/16 1409 10/19/16 0451 10/21/16 0453  AST 21 120* 36  ALT 26 97* 59  ALKPHOS 52 80 93  BILITOT 0.8 1.2 0.9  PROT 6.2* 6.5 6.0*  ALBUMIN 2.5* 2.3* 1.9*   Coagulation Profile:  Recent Labs Lab 10/17/16 0506 10/18/16 0809 10/19/16 0451 10/20/16 0918 10/21/16 0453  INR 2.38 1.30 1.26 1.63 2.01   CBG:  Recent Labs Lab 10/20/16 0733 10/20/16 1133 10/20/16 1617 10/20/16 2136 10/21/16 0741  GLUCAP 279* 285* 286* 230* 204*   Urine analysis:    Component Value Date/Time   COLORURINE STRAW (A) 10/13/2016 1428   APPEARANCEUR CLEAR 10/13/2016 1428   LABSPEC 1.010 10/13/2016 1428   PHURINE 5.0 10/13/2016 1428   GLUCOSEU 50 (A) 10/13/2016 1428   HGBUR MODERATE (A) 10/13/2016 1428   BILIRUBINUR NEGATIVE 10/13/2016 1428   KETONESUR NEGATIVE 10/13/2016 1428   PROTEINUR 30 (A) 10/13/2016 1428   UROBILINOGEN 0.2 01/11/2015 1850   NITRITE NEGATIVE 10/13/2016 1428   LEUKOCYTESUR NEGATIVE 10/13/2016 1428   Radiology Studies: Ct Abdomen Pelvis Wo Contrast Result Date: 10/13/2016 1. Small bilateral pleural effusion. Bilateral lower lobe posterior atelectasis or  infiltrate.  2. No nephrolithiasis. No hydronephrosis or hydroureter. Nonspecific mild bilateral perinephric stranding. No calcified ureteral calculi.  3. There is moderate distended urinary bladder without urinary bladder filling defects. No calcified calculi. Please note the urinary bladder measures at least 17 cm in length cranial caudally.  4. Normal appendix.  No pericecal inflammation.  5. No small bowel or colonic obstruction. Few diverticula are noted descending colon proximal sigmoid colon. No definite  evidence of acute colitis or diverticulitis.  6. Limited evaluation of the pelvis due to metallic artifacts from left hip prosthesis.  7. There is disc space flattening with mild vacuum disc phenomenon and mild posterior disc bulge at L2-L3 level.  8. Mild anasarca infiltration of subcutaneous fat abdominal and pelvic wall.  9. There is a umbilical hernia containing omental fat best seen in sagittal image 93 measures 2 cm without evidence of acute complication. Mild skin thickening and subcutaneous stranding in lower anterior abdominal wall axial image 56 probable mild dermatitis or subcutaneous edema.   Dg Chest 2 View Result Date: 10/13/2016 Re- demonstration of heart failure with interstitial edema and effusions.   Scheduled Meds: . atorvastatin  10 mg Oral Daily  . carvedilol  6.25 mg Oral BID WC  . Chlorhexidine Gluconate Cloth  6 each Topical Q0600  . ciprofloxacin  500 mg Oral BID  . insulin aspart  0-9 Units Subcutaneous TID WC  . insulin detemir  15 Units Subcutaneous BID  . metroNIDAZOLE  500 mg Oral Q8H  . mupirocin ointment  1 application Nasal BID  . polyethylene glycol  17 g Oral Daily  . potassium chloride  40 mEq Oral Once  . pregabalin  75 mg Oral QHS  . sacubitril-valsartan  1 tablet Oral BID  . senna-docusate  1 tablet Oral BID  . Warfarin - Pharmacist Dosing Inpatient   Does not apply q1800   Continuous Infusions:    LOS: 8 days   Time spent:30 minutes    Domenic Polite, MD [facility] Pager [phone]  If 7PM-7AM, please contact night-coverage [email or web address] Password [facility] 10/21/2016, 10:57 AM

## 2016-10-21 NOTE — Progress Notes (Signed)
Advanced Heart Failure Rounding Note  PCP: Dr. Inda Merlin Primary Cardiologist: Dr. Caryl Comes   Subjective:    Admitted on 10/13/16 with SOB, acute on chronic systolic CHF (EF 0000000). Also with abdominal pain and nausea/vomiting. Abdominal US and CT with markedly distended gallbladder suggestive of acute cholecystitis, had percutaneous drain placed on 10/17/16.   Creatinine improved, down to 1.78 today. Leukocytosis resolved.  BP stable.   EP saw and his device was reprogrammed to treat VT >130 bpm with ATP.   Feels good today, no abdominal pain. Having BMs.    Objective:   Weight Range: 196 lb 6.4 oz (89.1 kg) Body mass index is 29 kg/m.   Vital Signs:   Temp:  [98.4 F (36.9 C)-100.3 F (37.9 C)] 98.6 F (37 C) (02/27 0626) Pulse Rate:  [71-88] 80 (02/27 0626) Resp:  [18-24] 23 (02/27 0626) BP: (123-133)/(60-83) 123/65 (02/27 0626) SpO2:  [92 %-98 %] 96 % (02/27 0626) Weight:  [196 lb 6.4 oz (89.1 kg)] 196 lb 6.4 oz (89.1 kg) (02/27 0626) Last BM Date: 10/20/16  Weight change: Filed Weights   10/19/16 0354 10/20/16 0514 10/21/16 0626  Weight: 195 lb 3.2 oz (88.5 kg) 198 lb 1.6 oz (89.9 kg) 196 lb 6.4 oz (89.1 kg)    Intake/Output:   Intake/Output Summary (Last 24 hours) at 10/21/16 0740 Last data filed at 10/21/16 0600  Gross per 24 hour  Intake          2106.25 ml  Output             1025 ml  Net          1081.25 ml     Physical Exam: General: Alert, appears happy today.  HEENT: normal Neck: supple. 5-6 cm JVD. Carotids 2+ bilat; no bruits. No lymphadenopathy or thyromegaly appreciated. Cor: PMI nondisplaced. Regular rate & rhythm. No rubs, gallops or murmurs. Lungs: Fine crackles in bilateral lower lobes.  Abdomen: soft, non tender, nondistended. No hepatosplenomegaly. No bruits or masses. Good bowel sounds. Drain in place.  Extremities: no cyanosis, clubbing, rash. Bilateral BKA. No thigh edema Neuro: Alert and oriented x 4.   Telemetry: V paced,  RBBB  Labs: CBC  Recent Labs  10/20/16 0406 10/21/16 0453  WBC 8.9 9.1  HGB 12.2* 12.0*  HCT 37.1* 36.5*  MCV 75.1* 74.2*  PLT 119* 123456   Basic Metabolic Panel  Recent Labs  10/20/16 0406 10/21/16 0453  NA 145 147*  K 3.9 3.1*  CL 110 108  CO2 26 29  GLUCOSE 306* 235*  BUN 50* 42*  CREATININE 1.89* 1.78*  CALCIUM 8.0* 8.2*   BNP (last 3 results)  Recent Labs  12/28/15 0550 10/13/16 1401  BNP 380.3* 1,039.0*      Medications:     Scheduled Medications: . atorvastatin  10 mg Oral Daily  . carvedilol  6.25 mg Oral BID WC  . Chlorhexidine Gluconate Cloth  6 each Topical Q0600  . enoxaparin (LOVENOX) injection  1 mg/kg Subcutaneous Q12H  . insulin aspart  0-9 Units Subcutaneous TID WC  . insulin detemir  10 Units Subcutaneous BID  . mupirocin ointment  1 application Nasal BID  . piperacillin-tazobactam (ZOSYN)  IV  3.375 g Intravenous Q8H  . polyethylene glycol  17 g Oral Daily  . pregabalin  75 mg Oral QHS  . senna-docusate  1 tablet Oral BID  . Warfarin - Pharmacist Dosing Inpatient   Does not apply q1800    PRN Medications: acetaminophen, guaiFENesin-dextromethorphan,  morphine injection, ondansetron   Assessment/Plan   1. Acute on chronic combined diastolic and systolic CHF, NYHA class IIIb: NICM with CRT-D.  - Coreg resumed.   - Restart Entresto 24/26 back today.  - If BP/creatinine stable, likely restart spironolactone and Lasix tomorrow.  2. PAF, s/p CRT-D: Follows with Dr. Caryl Comes, last seen earlier this month.  He is in NSR today.  - Device was reprogrammed this admission to lower threshold on VT rate for therapy.  - This patients CHA2DS2-VASc Score and unadjusted Ischemic Stroke Rate (% per year) is equal to 7.2 % stroke rate/year from a score of 5 Above score calculated as 1 point each if present [CHF, HTN, DM, Vascular=MI/PAD/Aortic Plaque, Age if 65-74, or Male], 2 points each if present [Age > 75, or Stroke/TIA/TE] - Continue warfarin.   3. Acute cholecystitis with sepsis:  s/p percutaneous gallbladder drain. Enterococcus durans from bile.  Surgery following.  - Getting Zosyn.  4. CKD stage III:  - baseline 1.40-1.42.  - Continues to improve.  5. Obesity - Counseled about sodium restricted diet.   Length of Stay: [place], NP  10/21/2016, 7:40 AM  Advanced Heart Failure Team Pager [phone] (M-F; 7a - 4p)  Please contact [facility] for night-coverage after hours (4p -7a ) and weekends on [email or web address]  Patient seen with NP, agree with the above note.  He is stable today, creatinine coming down and BP ok.  Can restart Entresto today, likely restart Lasix and spironolactone tomorrow.  Will need rehab stay before going home.   Loralie Champagne 10/21/2016 8:17 AM

## 2016-10-22 LAB — COMPREHENSIVE METABOLIC PANEL
ALK PHOS: 90 U/L (ref 38–126)
ALT: 39 U/L (ref 17–63)
AST: 27 U/L (ref 15–41)
Albumin: 1.9 g/dL — ABNORMAL LOW (ref 3.5–5.0)
Anion gap: 10 (ref 5–15)
BILIRUBIN TOTAL: 0.5 mg/dL (ref 0.3–1.2)
BUN: 42 mg/dL — ABNORMAL HIGH (ref 6–20)
CALCIUM: 7.8 mg/dL — AB (ref 8.9–10.3)
CO2: 26 mmol/L (ref 22–32)
CREATININE: 1.7 mg/dL — AB (ref 0.61–1.24)
Chloride: 105 mmol/L (ref 101–111)
GFR, EST AFRICAN AMERICAN: 44 mL/min — AB (ref >60)
GFR, EST NON AFRICAN AMERICAN: 38 mL/min — AB (ref >60)
Glucose, Bld: 248 mg/dL — ABNORMAL HIGH (ref 65–99)
Potassium: 3.7 mmol/L (ref 3.5–5.1)
SODIUM: 141 mmol/L (ref 135–145)
TOTAL PROTEIN: 5.9 g/dL — AB (ref 6.5–8.1)

## 2016-10-22 LAB — PROTIME-INR
INR: 2.24
Prothrombin Time: 25.1 seconds — ABNORMAL HIGH (ref 11.4–15.2)

## 2016-10-22 LAB — CULTURE, BLOOD (ROUTINE X 2)
CULTURE: NO GROWTH
Culture: NO GROWTH

## 2016-10-22 LAB — GLUCOSE, CAPILLARY
GLUCOSE-CAPILLARY: 247 mg/dL — AB (ref 65–99)
GLUCOSE-CAPILLARY: 270 mg/dL — AB (ref 65–99)
GLUCOSE-CAPILLARY: 281 mg/dL — AB (ref 65–99)
Glucose-Capillary: 225 mg/dL — ABNORMAL HIGH (ref 65–99)

## 2016-10-22 LAB — CBC
HEMATOCRIT: 32.7 % — AB (ref 39.0–52.0)
HEMOGLOBIN: 11 g/dL — AB (ref 13.0–17.0)
MCH: 24.6 pg — AB (ref 26.0–34.0)
MCHC: 33.6 g/dL (ref 30.0–36.0)
MCV: 73.2 fL — AB (ref 78.0–100.0)
Platelets: 162 10*3/uL (ref 150–400)
RBC: 4.47 MIL/uL (ref 4.22–5.81)
RDW: 15.9 % — ABNORMAL HIGH (ref 11.5–15.5)
WBC: 9.9 10*3/uL (ref 4.0–10.5)

## 2016-10-22 MED ORDER — SPIRONOLACTONE 25 MG PO TABS
12.5000 mg | ORAL_TABLET | Freq: Every day | ORAL | Status: DC
  Administered 2016-10-22 – 2016-10-23 (×2): 12.5 mg via ORAL
  Filled 2016-10-22 (×3): qty 1

## 2016-10-22 MED ORDER — FUROSEMIDE 40 MG PO TABS
40.0000 mg | ORAL_TABLET | Freq: Every day | ORAL | Status: DC
  Administered 2016-10-22 – 2016-10-23 (×2): 40 mg via ORAL
  Filled 2016-10-22 (×2): qty 1

## 2016-10-22 MED ORDER — WARFARIN SODIUM 5 MG PO TABS
5.0000 mg | ORAL_TABLET | Freq: Once | ORAL | Status: AC
Start: 2016-10-22 — End: 2016-10-22
  Administered 2016-10-22: 5 mg via ORAL
  Filled 2016-10-22: qty 1

## 2016-10-22 NOTE — NC FL2 (Signed)
[place] LEVEL OF CARE SCREENING TOOL     IDENTIFICATION  Patient Name: [name] [name] Birthdate: 1946-08-03 Sex: male Admission Date (Current Location): 10/13/2016  [facility] and [place] Number:  Herbalist and Address:         Provider Number: [id number]  Attending Physician Name and Address:  Velvet Bathe, MD  Relative Name and Phone Number:       Current Level of Care: Hospital Recommended Level of Care: [facility] Prior Approval Number:    Date Approved/Denied:   PASRR Number: [id number]  Discharge Plan: SNF    Current Diagnoses: Patient Active Problem List   Diagnosis Date Noted  . Abdominal pain   . Cholecystitis   . NICM (nonischemic cardiomyopathy) ([place])   . Stage 3 chronic kidney disease   . Hypernatremia   . Systolic CHF, acute on chronic ([place]) 10/13/2016  . Diarrhea 10/13/2016  . Acute on chronic systolic congestive heart failure ([place])   . Diabetes mellitus with complication ([place])   . HCAP (healthcare-associated pneumonia) 04/30/2016  . Weakness generalized 04/30/2016  . Status post hip hemiarthroplasty   . Urinary retention   . Dementia   . AKI (acute kidney injury) ([place])   . Thrombocytopenia ([place])   . Anemia of chronic disease   . Closed left hip fracture ([place]) 12/28/2015  . Acute renal failure superimposed on stage 3 chronic kidney disease ([place]) 12/28/2015  . Fall   . Diabetes mellitus with neuropathy ([place]) 01/10/2015  . Amputation of right lower extremity below knee ([place]) 01/08/2015  . Below knee amputation status ([place]) 01/03/2015  . Atrial fibrillation [I48.91] 11/01/2014  . Encounter for therapeutic drug monitoring 11/01/2014  . S/P bilateral BKA (below knee amputation) ([place]) 02/02/2014  . Osteomyelitis of right foot ([place]) 01/30/2014  . 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 ([place]) 01/04/2014  . Osteomyelitis of toe of right foot ([place]) 01/04/2014  . S/P Lt BKA 11/09/13 11/14/2013  . Acute  blood loss anemia 11/11/2013  . Type II or unspecified type diabetes mellitus 11/09/2013  . Lower limb amputation, great toe ([place]) 10/11/2013  . Lower limb amputation, other toe(s) 10/11/2013  . Chronic osteomyelitis of toe of left foot ([place]) 10/04/2013  . Foot osteomyelitis, left ([place]) 10/04/2013  . Uncontrolled pain, Lt toe 09/21/2013  . Gangrenous toe, Lt toe 09/21/2013  . PAD (peripheral artery disease) ([place]) 09/13/2013  . Diabetes mellitus ([place]) 09/13/2013  . Hyperlipidemia 09/13/2013  . PAF (paroxysmal atrial fibrillation) ([place]) 09/13/2013  . Critical lower limb ischemia- s/p Rt anterior tibial PTA 12/29/13 in preparation for Rt BKA 08/30/2013  . Generalized weakness 02/12/2013  . 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 HTN (hypertension) 04/08/2011  . NICM- EF 20-25% echo 6/14 09/29/2008  . LBBB 09/29/2008  . SYSTOLIC HEART FAILURE, CHRONIC 09/29/2008    Orientation RESPIRATION BLADDER Height & Weight     Self, Situation, Place  O2 (3L) Incontinent Weight: 90.7 kg (199 lb 14.4 oz) Height:  5\' 9"  (175.3 cm)  BEHAVIORAL SYMPTOMS/MOOD NEUROLOGICAL BOWEL NUTRITION STATUS   (NONE)  (NONE) Incontinent Diet (Currently on clear liquids, but advancing)  AMBULATORY STATUS COMMUNICATION OF NEEDS Skin   Extensive Assist Verbally Normal                       Personal Care Assistance Level of Assistance  Bathing, Feeding, Dressing Bathing Assistance: Limited assistance Feeding assistance: Independent Dressing Assistance: Limited assistance  Functional Limitations Info  Sight, Hearing, Speech Sight Info: Adequate Hearing Info: Adequate Speech Info: Adequate    SPECIAL CARE FACTORS FREQUENCY  PT (By licensed PT), OT (By licensed OT)     PT Frequency: 5/week OT Frequency: 5/week            Contractures Contractures Info: Not present    Additional Factors Info  Isolation Precautions Code Status Info: Full Code Allergies Info: NKDA   Insulin  Sliding Scale Info: 3/day Isolation Precautions Info: Contact isolation for MRSA by + pcr 10/17/16.     Current Medications (10/22/2016):  This is the current hospital active medication list Current Facility-Administered Medications  Medication Dose Route Frequency Provider Last Rate Last Dose  . acetaminophen (TYLENOL) tablet 650 mg  650 mg Oral Q6H PRN Brenton Grills, PA-C      . atorvastatin (LIPITOR) tablet 10 mg  10 mg Oral Daily Brenton Grills, PA-C   10 mg at 10/22/16 0900  . carvedilol (COREG) tablet 6.25 mg  6.25 mg Oral BID WC Lelon Perla, MD   6.25 mg at 10/22/16 0853  . ciprofloxacin (CIPRO) tablet 500 mg  500 mg Oral BID Domenic Polite, MD   500 mg at 10/22/16 [id number]  . feeding supplement (GLUCERNA SHAKE) (GLUCERNA SHAKE) liquid 237 mL  237 mL Oral TID BM Brooke A Miller, PA-C   237 mL at 10/22/16 1000  . furosemide (LASIX) tablet 40 mg  40 mg Oral Daily Larey Dresser, MD   40 mg at 10/22/16 0900  . guaiFENesin-dextromethorphan (ROBITUSSIN DM) 100-10 MG/5ML syrup 5 mL  5 mL Oral Q4H PRN Theodis Blaze, MD      . insulin aspart (novoLOG) injection 0-9 Units  0-9 Units Subcutaneous TID [facility] Brenton Grills, PA-C   3 Units at 10/22/16 [phone]  . insulin detemir (LEVEMIR) injection 15 Units  15 Units Subcutaneous BID Domenic Polite, MD   15 Units at 10/22/16 0900  . metroNIDAZOLE (FLAGYL) tablet 500 mg  500 mg Oral Q8H Domenic Polite, MD   500 mg at 10/22/16 [phone]  . morphine 2 MG/ML injection 2 mg  2 mg Intravenous Q3H PRN Domenic Polite, MD   2 mg at 10/19/16 1444  . ondansetron (ZOFRAN) injection 4 mg  4 mg Intravenous Q6H PRN Rhetta Mura Schorr, NP   4 mg at 10/19/16 1046  . pregabalin (LYRICA) capsule 75 mg  75 mg Oral QHS Domenic Polite, MD   75 mg at 10/21/16 2121  . sacubitril-valsartan (ENTRESTO) 24-26 mg per tablet  1 tablet Oral BID Arbutus Leas, NP   1 tablet at 10/22/16 0900  . spironolactone (ALDACTONE) tablet 12.5 mg  12.5 mg Oral Daily Larey Dresser, MD   12.5  mg at 10/22/16 0900  . Warfarin - Pharmacist Dosing Inpatient   Does not apply [place], [facility]         Discharge Medications: Please see discharge summary for a list of discharge medications.  Relevant Imaging Results:  Relevant Lab Results:   Additional Information SSN: [id number], s/p per cholecystostomy 2/24, drain to stay in place 6 to 8 weeks. Patient will follow-up with Dr. Hulen Skains and drain clinic outpatient.  Rigoberto Noel, LCSW

## 2016-10-22 NOTE — Progress Notes (Signed)
Inpatient Diabetes Program Recommendations  AACE/ADA: New Consensus Statement on Inpatient Glycemic Control (2015)  Target Ranges:  Prepandial:   less than 140 mg/dL      Peak postprandial:   less than 180 mg/dL (1-2 hours)      Critically ill patients:  140 - 180 mg/dL   Lab Results  Component Value Date   GLUCAP 225 (H) 10/22/2016   HGBA1C 11.0 (H) 10/15/2016    Review of Glycemic Control  Blood sugars continue to be > 180 mg/dL. Needs insulin adjustment.  Inpatient Diabetes Program Recommendations:    Increase Levemir to 20 units bid.  Will continue to follow. Needs insulin adjustment of home meds prior to discharge in light of HgbA1C of 11%.  Thank you. Lorenda Peck, RD, LDN, CDE Inpatient Diabetes Coordinator [phone]

## 2016-10-22 NOTE — Progress Notes (Signed)
Patient ID: [name] [name], male   DOB: 10/25/1945, 71 y.o.   MRN: [id number]    PROGRESS NOTE  [name] [name]  [id number] DOB: [birth year] DOA: 10/13/2016  PCP: [name], MD   Brief Narrative:  71 y.o. male with medical history significant of NICM status post ICD, CHF with EF to 20-25% in 2014, DM-2, CKD, PAF on coumadin, bilateral BKA who presented to the ED with c/o dyspnea, chest pain and abd pain, found to have CHF and Unremarkable CT abd. Then diuresed, developed sepsis from abd source, after extensive workup again finally noted to have Acute cholecystitis, CCS consulted, s/p perc Gb drain, now improving  Assessment & Plan:  Sepsis/Acute cholecystitis -finally RUQ [place] 2/23 was suggestive of acute cholecystitis after 2 non diagnostic CT abdomens -CCS consulted, s/p 5days of IV Zosyn, will change to Po Cipro/Flagyl today, continue this for 4 more days atleast -Fluid culture: Enterococcus-await sensitivities, gram stain polymicrobial -improving, advance diet -resumed anticoagulation-warfarin with lovenox -FU with Dr.Wyatt and in drain clinic/Radiology  Acute on chronic exacerbation of systolic CHF - CHF team managing. They are starting spironolactone and lasix.   AKI on CKD - stage III, with hypernatremia -creat around 1.47 at baseline -went up to 3 in setting of sepsis/hypotension, now down to 1.7  Hypertension, essential  - Pt currently on coreg, lasix, entresto, spironolactone. 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  Bilateral BKA - ambulates with prosthesis  DVT prophylaxis: resumed coumadin with lovenox bridge Code Status: Full  Family Communication: none at bedside, d/w wife and daughter 2/25 Disposition Plan: SNF within the next 1-2 days  Consultants:   CHF team  IR  CCS  Procedures:   PErc GB drain 2/24 by Dr.Shick  Antimicrobials:   Zosyn x5days   2/27: Cipro/Fl;agyl  Subjective:  Pt has no new complaints today.   Objective: Vitals:   10/22/16 0507 10/22/16 0744 10/22/16 0853 10/22/16 1127  BP: 107/66  101/66   Pulse: 78 74 78 75  Resp: 16 19  17   Temp: 99.3 F (37.4 C) 98.8 F (37.1 C)  98.7 F (37.1 C)  TempSrc: Oral Oral  Oral  SpO2: 95% 98%  94%  Weight: 90.7 kg (199 lb 14.4 oz)     Height:        Intake/Output Summary (Last 24 hours) at 10/22/16 1219 Last data filed at 10/22/16 0830  Gross per 24 hour  Intake             1320 ml  Output              150 ml  Net             1170 ml   Filed Weights   10/20/16 0514 10/21/16 0626 10/22/16 0507  Weight: 89.9 kg (198 lb 1.6 oz) 89.1 kg (196 lb 6.4 oz) 90.7 kg (199 lb 14.4 oz)   Examination:  General exam: AAOx2, in nad. Respiratory system: no wheezes, equal chest rise, no rhales Cardiovascular system: S1 &  S2 heard, RRR. B BKA Gastrointestinal system: Abdomen is less distended, soft and less tender in RUQ , GB drain noted, umbilical hernia noted-reducible, decreased bowel sounds Central nervous system more alert, . No focal neurological deficits. Psychiatry: pleasant, alert Ext: B/o BKA  Data Reviewed: I have personally reviewed following labs and imaging studies  CBC:  Recent Labs Lab 10/17/16 1409 10/18/16 0809 10/19/16 0451 10/20/16 0406 10/21/16 0453 10/22/16 0357  WBC 15.7* 15.6* 11.1* 8.9 9.1 9.9  NEUTROABS 14.5*  --   --   --   --   --   HGB 13.3 12.1* 13.1 12.2* 12.0* 11.0*  HCT 40.1 37.3* 40.2 37.1* 36.5* 32.7*  MCV 75.2* 75.5* 75.7* 75.1* 74.2* 73.2*  PLT 125* 122* 132*  119* 154 0000000   Basic Metabolic Panel:  Recent Labs Lab 10/15/16 1410  10/17/16 2248 10/18/16 0809 10/19/16 0451 10/20/16 0406 10/21/16 0453 10/22/16 0357  NA  --   < > 147* 151* 154* 145 147* 141  K  --   < > 3.6 3.5 3.2* 3.9 3.1* 3.7  CL  --   < > 110 110 112* 110 108 105  CO2  --   < > 28 31 31 26 29 26   GLUCOSE  --   < > 156* 147* 106* 306* 235* 248*  BUN  --   < > 56* 56* 49* 50* 42* 42*  CREATININE  --   < > 2.56* 2.14* 1.80* 1.89* 1.78* 1.70*  CALCIUM  --   < > 8.0* 8.3* 8.5* 8.0* 8.2* 7.8*  MG 2.0  --  2.2  --   --   --   --   --   < > = values in this interval not displayed. \Liver Function Tests:  Recent Labs Lab 10/17/16 1409 10/19/16 0451 10/21/16 0453 10/22/16 0357  AST 21 120* 36 27  ALT 26 97* 59 39  ALKPHOS 52 80 93 90  BILITOT 0.8 1.2 0.9 0.5  PROT 6.2* 6.5 6.0* 5.9*  ALBUMIN 2.5* 2.3* 1.9* 1.9*   Coagulation Profile:  Recent Labs Lab 10/18/16 0809 10/19/16 0451 10/20/16 0918 10/21/16 0453 10/22/16 0357  INR 1.30 1.26 1.63 2.01 2.24   CBG:  Recent Labs Lab 10/21/16 1125 10/21/16 1632 10/21/16 1942 10/22/16 0736 10/22/16 1119  GLUCAP 203* 270* 234* 225* 247*   Urine analysis:    Component Value Date/Time   COLORURINE STRAW (A) 10/13/2016 1428   APPEARANCEUR CLEAR 10/13/2016 1428   LABSPEC 1.010 10/13/2016 1428   PHURINE 5.0 10/13/2016 1428   GLUCOSEU 50 (A) 10/13/2016 1428   HGBUR MODERATE (A) 10/13/2016 1428   BILIRUBINUR NEGATIVE 10/13/2016 1428   KETONESUR NEGATIVE 10/13/2016 1428   PROTEINUR 30 (A) 10/13/2016 1428   UROBILINOGEN 0.2 01/11/2015 1850   NITRITE NEGATIVE 10/13/2016 1428   LEUKOCYTESUR NEGATIVE 10/13/2016 1428   Radiology Studies: Ct Abdomen Pelvis Wo Contrast Result Date: 10/13/2016 1. Small bilateral pleural effusion. Bilateral lower lobe posterior atelectasis or infiltrate.  2. No nephrolithiasis. No hydronephrosis or hydroureter. Nonspecific mild bilateral perinephric stranding. No calcified ureteral  calculi.  3. There is moderate distended urinary bladder without urinary bladder filling defects. No calcified calculi. Please note the urinary bladder measures at least 17 cm in length cranial caudally.  4. Normal appendix.  No pericecal inflammation.  5. No small bowel or colonic obstruction. Few diverticula are noted descending colon proximal sigmoid colon. No definite evidence of acute colitis or diverticulitis.  6. Limited evaluation of the pelvis due to  metallic artifacts from left hip prosthesis.  7. There is disc space flattening with mild vacuum disc phenomenon and mild posterior disc bulge at L2-L3 level.  8. Mild anasarca infiltration of subcutaneous fat abdominal and pelvic wall.  9. There is a umbilical hernia containing omental fat best seen in sagittal image 93 measures 2 cm without evidence of acute complication. Mild skin thickening and subcutaneous stranding in lower anterior abdominal wall axial image 56 probable mild dermatitis or subcutaneous edema.   Dg Chest 2 View Result Date: 10/13/2016 Re- demonstration of heart failure with interstitial edema and effusions.   Scheduled Meds: . atorvastatin  10 mg Oral Daily  . carvedilol  6.25 mg Oral BID WC  . ciprofloxacin  500 mg Oral BID  . feeding supplement (GLUCERNA SHAKE)  237 mL Oral TID BM  . furosemide  40 mg Oral Daily  . insulin aspart  0-9 Units Subcutaneous TID WC  . insulin detemir  15 Units Subcutaneous BID  . metroNIDAZOLE  500 mg Oral Q8H  . pregabalin  75 mg Oral QHS  . sacubitril-valsartan  1 tablet Oral BID  . spironolactone  12.5 mg Oral Daily  . warfarin  5 mg Oral ONCE-1800  . Warfarin - Pharmacist Dosing Inpatient   Does not apply q1800   Continuous Infusions:    LOS: 9 days   Time spent: 30 minutes   Velvet Bathe, MD [facility] Pager [phone]  If 7PM-7AM, please contact night-coverage [email or web address] Password [facility] 10/22/2016, 12:19 PM

## 2016-10-22 NOTE — Progress Notes (Signed)
Patient ID: [name] [name], male   DOB: 16-Jul-1946, 71 y.o.   MRN: [id number]     Advanced Heart Failure Rounding Note  PCP: Dr. Inda Merlin Primary Cardiologist: Dr. Caryl Comes   Subjective:    Admitted on 10/13/16 with SOB, acute on chronic systolic CHF (EF 0000000). Also with abdominal pain and nausea/vomiting. Abdominal US and CT with markedly distended gallbladder suggestive of acute cholecystitis, had percutaneous drain placed on 10/17/16.   Creatinine improved, down to 1.7 today. Leukocytosis resolved.  BP stable, Entresto restarted yesterday.   EP saw and his device was reprogrammed to treat VT >130 bpm with ATP.   Feels good today, no abdominal pain. Eating regular diet.  Having BMs.    Objective:   Weight Range: 199 lb 14.4 oz (90.7 kg) Body mass index is 29.52 kg/m.   Vital Signs:   Temp:  [97.5 F (36.4 C)-100.4 F (38 C)] 98.8 F (37.1 C) (02/28 0744) Pulse Rate:  [74-87] 74 (02/28 0744) Resp:  [16-25] 19 (02/28 0744) BP: (105-121)/(61-70) 107/66 (02/28 0507) SpO2:  [92 %-98 %] 98 % (02/28 0744) Weight:  [199 lb 14.4 oz (90.7 kg)] 199 lb 14.4 oz (90.7 kg) (02/28 0507) Last BM Date: 10/20/16  Weight change: Filed Weights   10/20/16 0514 10/21/16 0626 10/22/16 0507  Weight: 198 lb 1.6 oz (89.9 kg) 196 lb 6.4 oz (89.1 kg) 199 lb 14.4 oz (90.7 kg)    Intake/Output:   Intake/Output Summary (Last 24 hours) at 10/22/16 0815 Last data filed at 10/22/16 0630  Gross per 24 hour  Intake             1200 ml  Output              150 ml  Net             1050 ml     Physical Exam: General: Alert, appears happy today.  HEENT: normal Neck: supple. JVP 7 cm. Carotids 2+ bilat; no bruits. No lymphadenopathy or thyromegaly appreciated. Cor: PMI nondisplaced. Regular rate & rhythm. No rubs, gallops or murmurs. Lungs: Fine crackles in bilateral lower lobes.  Abdomen: soft, non tender, nondistended. No hepatosplenomegaly. No bruits or masses. Good bowel sounds. Drain in place.    Extremities: no cyanosis, clubbing, rash. Bilateral BKA. No thigh edema Neuro: Alert and oriented x 4.   Telemetry: V paced, RBBB  Labs: CBC  Recent Labs  10/21/16 0453 10/22/16 0357  WBC 9.1 9.9  HGB 12.0* 11.0*  HCT 36.5* 32.7*  MCV 74.2* 73.2*  PLT 154 0000000   Basic Metabolic Panel  Recent Labs  10/21/16 0453 10/22/16 0357  NA 147* 141  K 3.1* 3.7  CL 108 105  CO2 29 26  GLUCOSE 235* 248*  BUN 42* 42*  CREATININE 1.78* 1.70*  CALCIUM 8.2* 7.8*   BNP (last 3 results)  Recent Labs  12/28/15 0550 10/13/16 1401  BNP 380.3* 1,039.0*      Medications:     Scheduled Medications: . atorvastatin  10 mg Oral Daily  . carvedilol  6.25 mg Oral BID WC  . ciprofloxacin  500 mg Oral BID  . feeding supplement (GLUCERNA SHAKE)  237 mL Oral TID BM  . furosemide  40 mg Oral Daily  . insulin aspart  0-9 Units Subcutaneous TID WC  . insulin detemir  15 Units Subcutaneous BID  . metroNIDAZOLE  500 mg Oral Q8H  . pregabalin  75 mg Oral QHS  . sacubitril-valsartan  1 tablet Oral BID  .  spironolactone  12.5 mg Oral Daily  . Warfarin - Pharmacist Dosing Inpatient   Does not apply q1800    PRN Medications: acetaminophen, guaiFENesin-dextromethorphan, morphine injection, ondansetron   Assessment/Plan   1. Acute on chronic combined diastolic and systolic CHF, NYHA class IIIb: NICM with CRT-D. Cardiac meds held with acute cholecystitis/early sepsis picture. Now restarting.  - Coreg and Entresto resumed.   - Can start spironolactone 12.5 mg daily + Lasix 40 mg daily today.  2. PAF, s/p CRT-D: NSR. Continue warfarin.  3. Acute cholecystitis with sepsis:  s/p percutaneous gallbladder drain. Enterococcus durans from bile.  Surgery following.  - Continue Cipro/Flagyl.  4. CKD stage III: Baseline creatinine 1.40-1.42. Slow improvement, down to 1.7 today.   He will need rehab stay before going home.  Suspect he can go to SNF soon.   Length of Stay: 9  Loralie Champagne, MD   10/22/2016, 8:15 AM  Advanced Heart Failure Team Pager [phone] (M-F; 7a - 4p)  Please contact [facility] for night-coverage after hours (4p -7a ) and weekends on [email or web address]

## 2016-10-22 NOTE — Progress Notes (Signed)
ANTICOAGULATION CONSULT NOTE - Follow-Up Consult  Pharmacy Consult for warfarin Indication: atrial fibrillation  No Known Allergies  Patient Measurements: Height: 5\' 9"  (175.3 cm) Weight: 199 lb 14.4 oz (90.7 kg) IBW/kg (Calculated) : 70.7  Vital Signs: Temp: 98.8 F (37.1 C) (02/28 0744) Temp Source: Oral (02/28 0744) BP: 101/66 (02/28 0853) Pulse Rate: 78 (02/28 0853)  Labs:  Recent Labs  10/20/16 0406 10/20/16 0918 10/21/16 0453 10/22/16 0357  HGB 12.2*  --  12.0* 11.0*  HCT 37.1*  --  36.5* 32.7*  PLT 119*  --  154 162  LABPROT  --  19.5* 23.1* 25.1*  INR  --  1.63 2.01 2.24  CREATININE 1.89*  --  1.78* 1.70*    Estimated Creatinine Clearance: 43.1 mL/min (by C-G formula based on SCr of 1.7 mg/dL (H)).   Medical History: Past Medical History:  Diagnosis Date  . Anemia    takes Iron pill daily  . Arthritis   . Automatic implantable cardioverter-defibrillator in situ    a. 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  . CAD (coronary artery disease)   . Cardiomyopathy, nonischemic ([place])    a. Cath 2003: mild nonobstructive CAD, EF 25% at that time.  . Chronic systolic CHF (congestive heart failure) (HCC)    takes Furosemide daily as well as Aldactone  . CKD (chronic kidney disease), stage III   . Complication of anesthesia   . Constipation    takes Sennokot daily  . Dementia   . GERD (gastroesophageal reflux disease)    takes Nexium daily  . Headache    occasionally  . High cholesterol    takes Atorvastatin daily  . History of blood transfusion    no abnormal reaction noted  . History of bronchitis    > 1 yr ago  . History of kidney stones   . Hypertension    takes Coreg daily  . LBBB (left bundle branch block)    takes Coumadin daily  . NSVT (nonsustained ventricular tachycardia) ([place])    a. Noted on ICD interrogation in 2011.  [name] PAD (peripheral artery disease) ([place])    a. 08/2013 Periph Angio/PTA: Abd Ao nl, RLE- 3v  runoff, PT diff dzs, AT 90p, LLE 2v runoff, PT 100, AT 76m (diamondback ORA/chocolate balloon PTA).  [name] PAF (paroxysmal atrial fibrillation) ([place])    a. Noted on ICD interrogation 2012;  b. coumadin d/c'd 01/2013.  [name] PAF (paroxysmal atrial fibrillation) ([place])   . Peripheral neuropathy (HCC)    hands;numbness and tingling   . Pneumonia    hx of > 63yr ago  . PONV (postoperative nausea and vomiting)   . Type II diabetes mellitus ([place])    takes Levemir daily as well as Novolog  . Urinary frequency   . Urinary urgency     Assessment: 71 yo male on warfarin PTA for Afib, admitted with supratherapeutic INR s/p reversal with vitamin K. Warfarin was on hold for placement of a percutaneous drain for cholecystitis 2/24. Warfarin restarted 2/25. INR remains therapeutic.  Of note, pt on much lower warfarin doses than PTA - possibly due to decreased PO intake and drug interaction with Cipro/Flagyl.  PTA warfarin dose: 10mg  daily except 15mg  on Wed/Sat   Goal of Therapy:  Monitor platelets by anticoagulation protocol: Yes INR 2-3   Plan:  Repeat Warfarin 5 mg PO x1 tonight Monitor Hgb, Pltc, s/sx bleeding, weight Will need close INR follow-up outpt as abx stop and PO intake improves.  [employer], Pharm.D., BCPS Clinical Pharmacist Pager [phone] 10/22/2016 10:58 AM

## 2016-10-23 ENCOUNTER — Other Ambulatory Visit: Payer: Self-pay | Admitting: Internal Medicine

## 2016-10-23 LAB — BASIC METABOLIC PANEL
Anion gap: 7 (ref 5–15)
BUN: 38 mg/dL — ABNORMAL HIGH (ref 6–20)
CO2: 27 mmol/L (ref 22–32)
CREATININE: 1.52 mg/dL — AB (ref 0.61–1.24)
Calcium: 8 mg/dL — ABNORMAL LOW (ref 8.9–10.3)
Chloride: 105 mmol/L (ref 101–111)
GFR calc Af Amer: 51 mL/min — ABNORMAL LOW (ref >60)
GFR, EST NON AFRICAN AMERICAN: 44 mL/min — AB (ref >60)
Glucose, Bld: 322 mg/dL — ABNORMAL HIGH (ref 65–99)
Potassium: 3.8 mmol/L (ref 3.5–5.1)
SODIUM: 139 mmol/L (ref 135–145)

## 2016-10-23 LAB — PROTIME-INR
INR: 1.98
PROTHROMBIN TIME: 22.8 s — AB (ref 11.4–15.2)

## 2016-10-23 LAB — GLUCOSE, CAPILLARY
GLUCOSE-CAPILLARY: 230 mg/dL — AB (ref 65–99)
GLUCOSE-CAPILLARY: 321 mg/dL — AB (ref 65–99)
Glucose-Capillary: 322 mg/dL — ABNORMAL HIGH (ref 65–99)

## 2016-10-23 MED ORDER — CARVEDILOL 6.25 MG PO TABS
6.2500 mg | ORAL_TABLET | Freq: Two times a day (BID) | ORAL | 0 refills | Status: DC
Start: 2016-10-23 — End: 2017-05-15

## 2016-10-23 MED ORDER — METRONIDAZOLE 500 MG PO TABS: 500.0000 mg | ORAL_TABLET | Freq: Three times a day (TID) | ORAL | 0 refills | Status: AC

## 2016-10-23 MED ORDER — SPIRONOLACTONE 25 MG PO TABS: 12.5000 mg | ORAL_TABLET | Freq: Every day | ORAL | 0 refills | Status: DC

## 2016-10-23 MED ORDER — INSULIN DETEMIR 100 UNIT/ML ~~LOC~~ SOLN: 15.0000 [IU] | Freq: Two times a day (BID) | SUBCUTANEOUS | 0 refills | Status: DC

## 2016-10-23 MED ORDER — WARFARIN SODIUM 10 MG PO TABS: 10.0000 mg | ORAL_TABLET | Freq: Every day | ORAL | 0 refills | Status: DC

## 2016-10-23 MED ORDER — WARFARIN SODIUM 5 MG PO TABS
5.0000 mg | ORAL_TABLET | Freq: Once | ORAL | Status: AC
  Administered 2016-10-23: 5 mg via ORAL
  Filled 2016-10-23: qty 1

## 2016-10-23 MED ORDER — CIPROFLOXACIN HCL 500 MG PO TABS: 500.0000 mg | ORAL_TABLET | Freq: Two times a day (BID) | ORAL | 0 refills | Status: AC

## 2016-10-23 NOTE — Discharge Summary (Signed)
Physician Discharge Summary  [name] [name] [id number] DOB: Aug 28, 1945 DOA: 10/13/2016  PCP: [name], MD  Admit date: 10/13/2016 Discharge date: 10/23/2016  Time spent: > 35 minutes  Recommendations for Outpatient Follow-up:  1. Monitor serum creatinine 2. Ensure patient has f/u with IR outpatient clinic and Cardiologist   Discharge Diagnoses:  Principal Problem:   Systolic CHF, acute on chronic ([place]) Active Problems:   HTN (hypertension)   Diabetes mellitus ([place])   Hyperlipidemia   Atrial fibrillation [I48.91]   Acute renal failure superimposed on stage 3 chronic kidney disease (HCC)   Anemia of chronic disease   Diarrhea   Acute on chronic systolic congestive heart failure (HCC)   Diabetes mellitus with complication (HCC)   Abdominal pain   Cholecystitis   NICM (nonischemic cardiomyopathy) (HCC)   Stage 3 chronic kidney disease   Hypernatremia   Discharge Condition: stable  Diet recommendation: soft diet  Filed Weights   10/21/16 0626 10/22/16 0507 10/23/16 0516  Weight: 89.1 kg (196 lb 6.4 oz) 90.7 kg (199 lb 14.4 oz) 91.7 kg (202 lb 1.6 oz)    History of present illness:  71 y.o.malewith medical history significant of NICM status post ICD, CHFwith EF to 20-25% in 2014, DM-2, CKD, PAF on coumadin, bilateral BKA who presented to the ED with c/o dyspnea, chest pain and abd pain, found to have CHF and Unremarkable CT abd. Then diuresed, developed sepsis from abd source, after extensive workup again finally noted to have Acute cholecystitis, CCS consulted, s/p perc Gb drain  Hospital Course:  Sepsis/Acute cholecystitis -finally RUQ [place] 2/23 was suggestive of acute cholecystitis after 2 non diagnostic CT abdomens -CCS consulted, s/p 5 days of IV Zosyn, will change to Po Cipro/Flagyl today, continue this for 4 more days on d/c -Fluid culture: Enterococcus-await sensitivities, gram stain polymicrobial -resumed anticoagulation-warfarin  -FU with  Dr.Wyatt and in drain clinic/Radiology  Acute on chronic exacerbation of systolic CHF - CHF team followed while patient was in house. Medication regimen listed below  AKI on CKD - stage III, with hypernatremia -creat around 1.47 at baseline -went up to 3 in setting of sepsis/hypotension, now down to 1.5  Hypertension, essential  - Pt currently on coreg, lasix, entresto, spironolactone.   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  Bilateral BKA - ambulates with prosthesis  Procedures:  none  Consultations:  General surgery  Heart failure team  Discharge Exam: Vitals:   10/23/16 0858 10/23/16 1121  BP: 106/66   Pulse: 78 75  Resp:  19  Temp:  98.4 F (36.9 C)    General: Pt in nad, alert and awake Cardiovascular: rrr, no rubs Respiratory: no increased wob, no wheezes  Discharge Instructions   Discharge Instructions    Call MD for:  difficulty breathing, headache or visual disturbances    Complete by:  As directed    Call MD for:  persistant nausea and vomiting    Complete by:  As directed    Call MD for:  severe uncontrolled pain    Complete by:  As directed    Call MD for:  temperature >100.4  Complete by:  As directed    Diet - low sodium heart healthy    Complete by:  As directed    Discharge instructions    Complete by:  As directed    Pt to follow up with IR outpatient drain clinic as outpatient for pulling of drain.   Increase activity slowly    Complete by:  As directed      Current Discharge Medication List    START taking these medications   Details  ciprofloxacin (CIPRO) 500 MG tablet  Take 1 tablet (500 mg total) by mouth 2 (two) times daily. Qty: 8 tablet, Refills: 0    metroNIDAZOLE (FLAGYL) 500 MG tablet Take 1 tablet (500 mg total) by mouth every 8 (eight) hours. Qty: 12 tablet, Refills: 0    spironolactone (ALDACTONE) 25 MG tablet Take 0.5 tablets (12.5 mg total) by mouth daily. Qty: 30 tablet, Refills: 0      CONTINUE these medications which have CHANGED   Details  carvedilol (COREG) 6.25 MG tablet Take 1 tablet (6.25 mg total) by mouth 2 (two) times daily with a meal. Qty: 60 tablet, Refills: 0    insulin detemir (LEVEMIR) 100 UNIT/ML injection Inject 0.15 mLs (15 Units total) into the skin 2 (two) times daily. Qty: 10 mL, Refills: 0      CONTINUE these medications which have NOT CHANGED   Details  acetaminophen (TYLENOL) 325 MG tablet Take 2 tablets (650 mg total) by mouth every 6 (six) hours as needed for mild pain (or Fever >/= 101).    atorvastatin (LIPITOR) 10 MG tablet Take 1 tablet (10 mg total) by mouth daily. Qty: 30 tablet, Refills: 1    Cholecalciferol (VITAMIN D3) 10000 units TABS Take 1 tablet by mouth daily.    furosemide (LASIX) 40 MG tablet Take 1 tablet (40 mg total) by mouth daily. Qty: 30 tablet, Refills: 1    iron polysaccharides (NIFEREX) 150 MG capsule Take 1 capsule (150 mg total) by mouth daily. Qty: 30 capsule, Refills: 1    Multiple Vitamin (MULTIVITAMIN WITH MINERALS) TABS tablet Take 1 tablet by mouth daily.    omeprazole (PRILOSEC) 20 MG capsule Take 20 mg by mouth daily.    pregabalin (LYRICA) 75 MG capsule Take 1 capsule (75 mg total) by mouth 2 (two) times daily. Qty: 60 capsule, Refills: 1    sacubitril-valsartan (ENTRESTO) 24-26 MG Take 1 tablet by mouth 2 (two) times daily. Qty: 60 tablet, Refills: 6    warfarin (COUMADIN) 10 MG tablet TAKE AS DIRECTED BY COUMADIN CLINIC Qty: 90 tablet, Refills: 0      STOP taking these medications     aspirin EC 81 MG tablet      senna-docusate (SENOKOT-S) 8.6-50 MG per  tablet        No Known Allergies  Contact information for follow-up providers    SHICK, MICHAEL, MD Follow up in 5 week(s).   Specialty:  Interventional Radiology Why:  our office will call you with appointment date and time Contact information: [address] [place] [address] [phone]        Judeth Horn, MD. Go on 11/04/2016.   Specialty:  General Surgery Why:  Your appointment is 11/04/16 at 10:20AM. Please arrive 30 minutes prior to your appointment to check in and fill out necessary paperwork. This is for follow-up for your gallbladder drain.  Contact information: [address] [place] [address] [phone]  Loralie Champagne, MD Follow up on 11/10/2016.   Specialty:  Cardiology Why:  at 1030 for post hospital follow up. Please bring all of your medications to your visit. The code for parking is 5002. Enter via [address] off of [address]. Underground parking on right. Can also park in [facility] ED lot and enter [employer].  Contact information: [place] [address] [phone]            Contact information for after-discharge care    Destination    [facility] .   Specialty:  [place] information: [facility] [place] [phone]                   The results of significant diagnostics from this hospitalization (including imaging, microbiology, ancillary and laboratory) are listed below for reference.    Significant Diagnostic Studies: Ct Abdomen Pelvis Wo Contrast  Result Date: 10/17/2016 CLINICAL DATA:  Abdominal pain EXAM: CT ABDOMEN AND PELVIS WITHOUT CONTRAST TECHNIQUE: Multidetector CT imaging of the abdomen and pelvis was performed following the standard protocol without or intravenous contrast material administration. COMPARISON:  October 13, 2016 FINDINGS: Lower chest: There are  persistent bilateral pleural effusions with bibasilar airspace consolidation, slightly more on the right than on the left. There is cardiomegaly. Pacemaker lead tips are attached to the right atrium and right ventricle. Visualized pericardium is only minimally thickened. Hepatobiliary: No focal liver lesions are evident on this noncontrast enhanced study. The gallbladder is distended with what appears to represent a combination of small gallstones and sludge within the gallbladder. Gallbladder wall does not appear thickened by CT. Pancreas: No pancreatic mass or inflammatory focus. Pancreas appears somewhat atrophic. Spleen: No splenic lesions are evident. Adrenals/Urinary Tract: Adrenals appear normal bilaterally. Kidneys bilaterally show no mass or hydronephrosis on either side. There is no renal or ureteral calculus on either side. Urinary bladder is midline with wall thickness within normal limits. Note that susceptibility artifact from a total hip prosthesis on the left limits lower pelvic assessment. Stomach/Bowel: The rectal wall appears mildly thickened. There is no perirectal stranding or abscess in this area. No fistula. Soft tissue stranding is noted in the region of the cecum and proximal ascending colon. There are similar changes in the mid descending colonic region with localized thickening of the lateral Conal fascia on the right. No diverticular inflammation is seen in this area. No abscess or extraluminal air is seen in this area. There is no other mesenteric inflammation. No bowel obstruction. No free air or portal venous air. Vascular/Lymphatic: There is atherosclerotic calcification in the aorta and iliac arteries. Atherosclerotic calcification is noted more distal pelvic arterial vessels bilaterally. No aneurysm evident. There is moderate calcification in the mesenteric arteries without evident obstruction. No aneurysm is evident in the abdomen or pelvis. Reproductive: Prostate and seminal  vesicles appear grossly normal with limitation of visualization of these structures due to total hip replacement on the left causing artifact. Other: The appendix appears normal. No ascites or abscess evident in the abdomen or pelvis. There is a small ventral hernia containing only fat. Musculoskeletal: There are no blastic or lytic bone lesions. Total hip replacement on the left. No intramuscular or abdominal wall lesion. IMPRESSION: Areas of mesenteric thickening adjacent to the proximal at ascending colon and mid descending colon. No associated diverticular inflammation seen in these areas. Suspect a degree of colitis in these areas. No bowel obstruction. No abscess.  Appendix appears normal. Mild wall thickening in the rectum is noted without perirectal stranding or fistula. Suspect a degree of proctitis. This finding may warrant direct physical examination. Gallbladder is distended and contains small gallstones and sludge. Ultrasound of the gallbladder to further evaluate may be warranted given this finding. There are bilateral pleural effusions with patchy bibasilar consolidation. 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 No aneurysm. Small ventral hernia containing only fat. No renal or ureteral calculus.  No hydronephrosis. Status post total hip replacement on the left. Electronically Signed   By: Lowella Grip III M.D.   On: 10/17/2016 10:57   Ct Abdomen Pelvis Wo Contrast  Result Date: 10/13/2016 CLINICAL DATA:  Abdominal pain EXAM: CT ABDOMEN AND PELVIS WITHOUT CONTRAST TECHNIQUE: Multidetector CT imaging of the abdomen and pelvis was performed following the standard protocol without IV contrast. COMPARISON:  02/13/2013 FINDINGS: Lower chest: There is small bilateral pleural effusion. Bilateral lower lobe posterior atelectasis or infiltrate. Partially visualized cardiac pacemaker leads. Cardiomegaly is noted. Hepatobiliary: Unenhanced liver the shows no biliary  ductal dilatation. No focal hepatic abnormality. Again noted layering gallbladder sludge or tiny calcified gallstones within gallbladder fundal region. Pancreas: Unenhanced pancreas is atrophic fatty replaced. Stable 1.3 cm hyperdense nodule in the region of pancreatic tail probable accessory splenule. Spleen: Stable small accessory splenules. Unenhanced spleen without focal abnormality. Adrenals/Urinary Tract: No adrenal gland mass. Unenhanced kidneys shows no nephrolithiasis. No hydronephrosis or hydroureter. No calcified ureteral calculi are noted bilaterally. There is moderate distended urinary bladder. No urinary bladder filling defects. Limited evaluation of the pelvis due to metallic artifacts from left hip prosthesis. Nonspecific mild bilateral perinephric stranding. Stomach/Bowel: No small bowel obstruction. No gastric outlet obstruction. No pericecal inflammation. Normal appendix partially visualized in axial image 53. Some colonic stool noted in right colon. Some colonic stool and gas noted within transverse colon and descending colon without significant colonic distension. Few diverticula are noted descending colon proximal sigmoid colon. No definite evidence of acute colitis or diverticulitis. Some colonic gas noted proximal sigmoid colon. No distal colonic obstruction. Vascular/Lymphatic: Atherosclerotic calcifications of abdominal aorta and iliac arteries. No aortic aneurysm. Reproductive: No pelvic masses noted. Prostate gland measures 4.3 x 2.9 cm within normal limits. Other: No ascites or free abdominal air. Mild anasarca infiltration of subcutaneous fat bilateral flank wall and anterior abdominal wall. There is a umbilical hernia containing omental fat best seen in sagittal image 93 measures 2 cm without evidence of acute complication. Mild skin thickening and subcutaneous stranding in lower anterior abdominal wall axial image 56 probable mild dermatitis or subcutaneous edema. Musculoskeletal: No  destructive bony lesions are noted. There is disc space flattening with mild vacuum disc phenomenon and mild posterior disc bulge at L3-L4 level. There are metallic artifacts from left hip prosthesis. IMPRESSION: 1. Small bilateral pleural effusion. Bilateral lower lobe posterior atelectasis or infiltrate. 2. No nephrolithiasis. No hydronephrosis or hydroureter. Nonspecific mild bilateral perinephric stranding. No calcified ureteral calculi. 3. There is moderate distended urinary bladder without urinary bladder filling defects. No calcified calculi. Please note the urinary bladder measures at least 17 cm in length cranial caudally. 4. Normal appendix.  No pericecal inflammation. 5. No small bowel or colonic obstruction. Few diverticula are noted descending colon proximal sigmoid colon. No definite evidence of acute colitis or diverticulitis. 6. Limited evaluation of the pelvis due to metallic artifacts from left hip prosthesis. 7. There is disc space flattening with mild vacuum disc phenomenon and mild posterior disc bulge at L2-L3  level. 8. Mild anasarca infiltration of subcutaneous fat abdominal and pelvic wall. 9. There is a umbilical hernia containing omental fat best seen in sagittal image 93 measures 2 cm without evidence of acute complication. Mild skin thickening and subcutaneous stranding in lower anterior abdominal wall axial image 56 probable mild dermatitis or subcutaneous edema. Electronically Signed   By: Lahoma Crocker M.D.   On: 10/13/2016 16:57   Dg Chest 2 View  Result Date: 10/13/2016 CLINICAL DATA:  Dizziness. Chest pain. Shortness of breath. Cough. nausea. EXAM: CHEST  2 VIEW COMPARISON:  05/02/2016 FINDINGS: Chronic mild cardiomegaly. Pacemaker/AICD leads in the same approximate position. Re- demonstration of congestive heart failure with venous hypertension, mild interstitial edema and small effusions with dependent atelectasis. No acute bone finding. IMPRESSION: Re- demonstration of heart  failure with interstitial edema and effusions. Electronically Signed   By: Nelson Chimes M.D.   On: 10/13/2016 12:31   Dg Abd 1 View  Result Date: 10/16/2016 CLINICAL DATA:  Nausea. EXAM: ABDOMEN - 1 VIEW COMPARISON:  10/13/2016 . FINDINGS: Soft tissue structures are unremarkable. Distended loops of small and large bowel noted. Findings consist with adynamic ileus. Mild gastric distention also noted. Degenerative changes lumbar spine. Left hip replacement. IMPRESSION: Distended loops of small and large bowel noted most consistent with adynamic ileus. Mild gastric distention also noted. Follow-up abdominal series suggested to exclude developing small bowel obstruction. Electronically Signed   By: Marcello Moores  Register   On: 10/16/2016 16:06   Ir Perc Cholecystostomy  Result Date: 10/18/2016 INDICATION: ACUTE CHOLECYSTITIS, NON OPERATIVE CANDIDATE EXAM: CHOLECYSTOSTOMY MEDICATIONS: 3.375 g Zosyn; The antibiotic was administered within an appropriate time frame prior to the initiation of the procedure. ANESTHESIA/SEDATION: Moderate (conscious) sedation was employed during this procedure. A total of Versed 0.5 mg and Fentanyl 25 mcg was administered intravenously. Moderate Sedation Time: 10 minutes. The patient's level of consciousness and vital signs were monitored continuously by radiology nursing throughout the procedure under my direct supervision. FLUOROSCOPY TIME:  Fluoroscopy Time:  18 seconds (3 mGy). COMPLICATIONS: None immediate. PROCEDURE: Informed written consent was obtained from the patient's family after a thorough discussion of the procedural risks, benefits and alternatives. All questions were addressed. Maximal Sterile Barrier Technique was utilized including caps, mask, sterile gowns, sterile gloves, sterile drape, hand hygiene and skin antiseptic. A timeout was performed prior to the initiation of the procedure. Previous imaging reviewed. Preliminary ultrasound performed. Distended thick-walled  gallbladder demonstrated in the mid axillary line through a lower intercostal space. Overlying skin marked. Under sterile conditions and local anesthesia, percutaneous transhepatic needle access performed of the distended gallbladder. Needle position confirmed with ultrasound. Images obtained for documentation. There was return of bile. Sample sent for Gram stain and culture. Guidewire advanced easily followed by the Accustick dilator set. Amplatz guidewire exchange performed. Tract dilatation performed to insert a 10 [place] drain. Retention loop formed in the gallbladder. Position confirmed with a gentle contrast injection. Images obtained for documentation. Catheter secured with a Prolene suture and connected to external gravity drainage bag. Sterile dressing applied. No immediate complication. Patient tolerated the procedure well. IMPRESSION: Successful ultrasound and fluoroscopic 10 French percutaneous transhepatic cholecystostomy. Electronically Signed   By: Jerilynn Mages.  Shick M.D.   On: 10/18/2016 14:03   Dg Chest Port 1 View  Result Date: 10/17/2016 CLINICAL DATA:  Acute respiratory distress EXAM: PORTABLE CHEST 1 VIEW COMPARISON:  10/13/2016 chest radiograph. FINDINGS: Stable configuration of 3 lead left subclavian ICD. Stable cardiomediastinal silhouette with mild cardiomegaly. No pneumothorax. Small bilateral  pleural effusions, slightly increased bilaterally. Stable mild pulmonary edema. Bibasilar lung opacities appear increased with associated volume loss. IMPRESSION: 1. Stable mild congestive heart failure. 2. Small bilateral pleural effusions appear slightly increased bilaterally. 3. Worsened bibasilar lung opacities and associated volume loss, favor atelectasis. Electronically Signed   By: Ilona Sorrel M.D.   On: 10/17/2016 10:30   US Abdomen Limited Ruq  Result Date: 10/17/2016 CLINICAL DATA:  Sepsis.  Acute cholecystitis. EXAM: US ABDOMEN LIMITED - RIGHT UPPER QUADRANT COMPARISON:  CT of the abdomen  and pelvis the same day. CT of the abdomen 10/13/2016 FINDINGS: Gallbladder: The gallbladder is distended. Wall thickened measuring 9 mm. There is sludge throughout the gallbladder. Common bile duct: Diameter: The common bile duct is within normal limits at 6 mm Liver: No focal lesion identified. Within normal limits in parenchymal echogenicity. IMPRESSION: 1. Distension of the gallbladder with wall thickening 9 mm suggesting cholecystitis. 2. Diffuse gallbladder sludge. Electronically Signed   By: San Morelle M.D.   On: 10/17/2016 16:52    Microbiology: Recent Results (from the past 240 hour(s))  MRSA PCR Screening     Status: Abnormal   Collection Time: 10/17/16 11:19 AM  Result Value Ref Range Status   MRSA by PCR POSITIVE (A) NEGATIVE Final    Comment:        The GeneXpert MRSA Assay (FDA approved for NASAL specimens only), is one component of a comprehensive MRSA colonization surveillance program. It is not intended to diagnose MRSA infection nor to guide or monitor treatment for MRSA infections. RESULT CALLED TO, READ BACK BY AND VERIFIED WITH: Ferrel Logan RN 13:05 10/17/16 ([email or web address])   Culture, blood (Routine X 2) w Reflex to ID Panel     Status: None   Collection Time: 10/17/16  2:09 PM  Result Value Ref Range Status   Specimen Description BLOOD LEFT HAND  Final   Special Requests BOTTLES DRAWN AEROBIC ONLY 5CC  Final   Culture NO GROWTH 5 DAYS  Final   Report Status 10/22/2016 FINAL  Final  Culture, blood (Routine X 2) w Reflex to ID Panel     Status: None   Collection Time: 10/17/16  2:18 PM  Result Value Ref Range Status   Specimen Description BLOOD LEFT ANTECUBITAL  Final   Special Requests IN PEDIATRIC BOTTLE 3CC  Final   Culture NO GROWTH 5 DAYS  Final   Report Status 10/22/2016 FINAL  Final  Urine culture     Status: Abnormal   Collection Time: 10/18/16 12:16 PM  Result Value Ref Range Status   Specimen Description URINE, CATHETERIZED  Final   Special  Requests Normal  Final   Culture <10,000 COLONIES/mL INSIGNIFICANT GROWTH (A)  Final   Report Status 10/19/2016 FINAL  Final  Aerobic/Anaerobic Culture (surgical/deep wound)     Status: None (Preliminary result)   Collection Time: 10/18/16  1:59 PM  Result Value Ref Range Status   Specimen Description BILE  Final   Special Requests Normal  Final   Gram Stain   Final    FEW WBC PRESENT, PREDOMINANTLY PMN ABUNDANT GRAM POSITIVE COCCI IN PAIRS FEW GRAM POSITIVE RODS RARE GRAM NEGATIVE RODS    Culture   Final    ABUNDANT ENTEROCOCCUS DURANS Sent to Branch for further susceptibility testing. ABUNDANT CLOSTRIDIUM SPECIES Results Called to: Murvin Natal RN, AT 1256 10/21/16 BY D. VANHOOK REGARDING RESULT DELAY    Report Status PENDING  Incomplete  Susceptibility, Aer + Anaerob     Status: None  Collection Time: 10/18/16  1:59 PM  Result Value Ref Range Status   Suscept, Aer + Anaerob Preliminary report  Final    Comment: (NOTE) Performed At: [facility] [address] [place], [place] [id number] Lindon Romp MD [id number]    Source of Sample BILE/ ENTEROCOCCUS DURANS  Final  Susceptibility Result     Status: None (Preliminary result)   Collection Time: 10/18/16  1:59 PM  Result Value Ref Range Status   Suscept Result 1 Enterococcus durans  Final    Comment: (NOTE) Identification performed by account, not confirmed by this laboratory. Performed At: [facility] [place], [place] [id number] Lindon Romp MD [id number]    Antimicrobial Suscept PENDING  Incomplete     Labs: Basic Metabolic Panel:  Recent Labs Lab 10/17/16 2248  10/19/16 0451 10/20/16 0406 10/21/16 0453 10/22/16 0357 10/23/16 1008  NA 147*  < > 154* 145 147* 141 139  K 3.6  < > 3.2* 3.9 3.1* 3.7 3.8  CL 110  < > 112* 110 108 105 105  CO2 28  < > 31 26 29 26 27   GLUCOSE 156*  < > 106* 306* 235* 248* 322*  BUN 56*  < > 49* 50* 42* 42* 38*  CREATININE 2.56*  <  > 1.80* 1.89* 1.78* 1.70* 1.52*  CALCIUM 8.0*  < > 8.5* 8.0* 8.2* 7.8* 8.0*  MG 2.2  --   --   --   --   --   --   < > = values in this interval not displayed. Liver Function Tests:  Recent Labs Lab 10/17/16 1409 10/19/16 0451 10/21/16 0453 10/22/16 0357  AST 21 120* 36 27  ALT 26 97* 59 39  ALKPHOS 52 80 93 90  BILITOT 0.8 1.2 0.9 0.5  PROT 6.2* 6.5 6.0* 5.9*  ALBUMIN 2.5* 2.3* 1.9* 1.9*   No results for input(s): LIPASE, AMYLASE in the last 168 hours.  Recent Labs Lab 10/17/16 2248  AMMONIA 26   CBC:  Recent Labs Lab 10/17/16 1409 10/18/16 0809 10/19/16 0451 10/20/16 0406 10/21/16 0453 10/22/16 0357  WBC 15.7* 15.6* 11.1* 8.9 9.1 9.9  NEUTROABS 14.5*  --   --   --   --   --   HGB 13.3 12.1* 13.1 12.2* 12.0* 11.0*  HCT 40.1 37.3* 40.2 37.1* 36.5* 32.7*  MCV 75.2* 75.5* 75.7* 75.1* 74.2* 73.2*  PLT 125* 122* 132* 119* 154 162   Cardiac Enzymes: No results for input(s): CKTOTAL, CKMB, CKMBINDEX, TROPONINI in the last 168 hours. BNP: BNP (last 3 results)  Recent Labs  12/28/15 0550 10/13/16 1401  BNP 380.3* 1,039.0*    ProBNP (last 3 results) No results for input(s): PROBNP in the last 8760 hours.  CBG:  Recent Labs Lab 10/22/16 1119 10/22/16 1642 10/22/16 2023 10/23/16 0820 10/23/16 1108  GLUCAP 247* 270* 281* 230* 321*    Signed:  Velvet Bathe MD.  [facility] 10/23/2016, 1:33 PM

## 2016-10-23 NOTE — Final Consult Note (Signed)
Consultant Final Sign-Off Note    Assessment/Final recommendations  [name] [name] is a 71 y.o. male followed by me for cholecystitis   Wound care (if applicable):  Drain per IR  Diet at discharge: per primary team   Activity at discharge: per primary team   Follow-up appointment:   Call [place] office [phone].  Needs follow-up with Dr. Nedra Hai in 3 weeks post discharge   Pending results:  Unresulted Labs    Start     Ordered   10/20/16 0500  Protime-INR  Daily,   R    Question:  Specimen collection method  Answer:  Lab=Lab collect   10/19/16 0956   10/17/16 1409  Culture, expectorated sputum-assessment  Once,   R    Question:  Patient immune status  Answer:  Normal   10/17/16 1408       Medication recommendations:   Other recommendations: Follow up with IR drain clinic   Thank you for allowing [place] to participate in the care of your patient!  Please consult [place] again if you have further needs for your patient.  Ladarryl Wrage A 10/23/2016 8:05 AM    Subjective     Objective  Vital signs in last 24 hours: Temp:  [98.2 F (36.8 C)-99.3 F (37.4 C)] 98.2 F (36.8 C) (03/01 0750) Pulse Rate:  [72-78] 72 (03/01 0750) Resp:  [17-24] 18 (03/01 0750) BP: (101-113)/(50-66) 113/57 (03/01 0516) SpO2:  [92 %-98 %] 97 % (03/01 0750) Weight:  [91.7 kg (202 lb 1.6 oz)] 91.7 kg (202 lb 1.6 oz) (03/01 0516)      Pertinent labs and Studies:  Recent Labs  10/21/16 0453 10/22/16 0357  WBC 9.1 9.9  HGB 12.0* 11.0*  HCT 36.5* 32.7*   BMET  Recent Labs  10/21/16 0453 10/22/16 0357  NA 147* 141  K 3.1* 3.7  CL 108 105  CO2 29 26  GLUCOSE 235* 248*  BUN 42* 42*  CREATININE 1.78* 1.70*  CALCIUM 8.2* 7.8*   No results for input(s): LABURIN in the last 72 hours. Results for orders placed or performed during the hospital encounter of 10/13/16  MRSA PCR Screening     Status: Abnormal   Collection Time: 10/17/16 11:19 AM  Result Value Ref Range  Status   MRSA by PCR POSITIVE (A) NEGATIVE Final    Comment:        The GeneXpert MRSA Assay (FDA approved for NASAL specimens only), is one component of a comprehensive MRSA colonization surveillance program. It is not intended to diagnose MRSA infection nor to guide or monitor treatment for MRSA infections. RESULT CALLED TO, READ BACK BY AND VERIFIED WITH: Ferrel Logan RN 13:05 10/17/16 ([email or web address])   Culture, blood (Routine X 2) w Reflex to ID Panel     Status: None   Collection Time: 10/17/16  2:09 PM  Result Value Ref Range Status   Specimen Description BLOOD LEFT HAND  Final   Special Requests BOTTLES DRAWN AEROBIC ONLY 5CC  Final   Culture NO GROWTH 5 DAYS  Final   Report Status 10/22/2016 FINAL  Final  Culture, blood (Routine X 2) w Reflex to ID Panel     Status: None   Collection Time: 10/17/16  2:18 PM  Result Value Ref Range Status   Specimen Description BLOOD LEFT ANTECUBITAL  Final   Special Requests IN PEDIATRIC BOTTLE 3CC  Final   Culture NO GROWTH 5 DAYS  Final   Report Status 10/22/2016 FINAL  Final  Urine culture  Status: Abnormal   Collection Time: 10/18/16 12:16 PM  Result Value Ref Range Status   Specimen Description URINE, CATHETERIZED  Final   Special Requests Normal  Final   Culture <10,000 COLONIES/mL INSIGNIFICANT GROWTH (A)  Final   Report Status 10/19/2016 FINAL  Final  Aerobic/Anaerobic Culture (surgical/deep wound)     Status: None (Preliminary result)   Collection Time: 10/18/16  1:59 PM  Result Value Ref Range Status   Specimen Description BILE  Final   Special Requests Normal  Final   Gram Stain   Final    FEW WBC PRESENT, PREDOMINANTLY PMN ABUNDANT GRAM POSITIVE COCCI IN PAIRS FEW GRAM POSITIVE RODS RARE GRAM NEGATIVE RODS    Culture   Final    ABUNDANT ENTEROCOCCUS DURANS Sent to [facility] for further susceptibility testing. HOLDING FOR POSSIBLE ANAEROBE Results Called to: Murvin Natal RN, AT 1256 10/21/16 BY D. VANHOOK REGARDING RESULT  DELAY    Report Status PENDING  Incomplete  Susceptibility, Aer + Anaerob     Status: None   Collection Time: 10/18/16  1:59 PM  Result Value Ref Range Status   Suscept, Aer + Anaerob Preliminary report  Final    Comment: (NOTE) Performed At: [facility] [address] [place], [place] [id number] Lindon Romp MD [id number]    Source of Sample BILE/ ENTEROCOCCUS DURANS  Final  Susceptibility Result     Status: None (Preliminary result)   Collection Time: 10/18/16  1:59 PM  Result Value Ref Range Status   Suscept Result 1 Enterococcus durans  Final    Comment: (NOTE) Identification performed by account, not confirmed by this laboratory. Performed At: [facility] [place], [place] [id number] Lindon Romp MD [id number]    Antimicrobial Suscept PENDING  Incomplete    Imaging: No results found.

## 2016-10-23 NOTE — Progress Notes (Signed)
  Subjective: No complaints Denies pain Tolerating po  Objective: Vital signs in last 24 hours: Temp:  [98.2 F (36.8 C)-99.3 F (37.4 C)] 98.2 F (36.8 C) (03/01 0750) Pulse Rate:  [72-78] 72 (03/01 0750) Resp:  [17-24] 18 (03/01 0750) BP: (101-113)/(50-66) 113/57 (03/01 0516) SpO2:  [92 %-98 %] 97 % (03/01 0750) Weight:  [91.7 kg (202 lb 1.6 oz)] 91.7 kg (202 lb 1.6 oz) (03/01 0516) Last BM Date: 10/20/16  Intake/Output from previous day: 02/28 0701 - 03/01 0700 In: 1560 [P.O.:1560] Out: 1750 [Urine:1600; Drains:150] Intake/Output this shift: No intake/output data recorded.  Exam: Abdomen soft, drain with bile  Lab Results:   Recent Labs  10/21/16 0453 10/22/16 0357  WBC 9.1 9.9  HGB 12.0* 11.0*  HCT 36.5* 32.7*  PLT 154 162   BMET  Recent Labs  10/21/16 0453 10/22/16 0357  NA 147* 141  K 3.1* 3.7  CL 108 105  CO2 29 26  GLUCOSE 235* 248*  BUN 42* 42*  CREATININE 1.78* 1.70*  CALCIUM 8.2* 7.8*   PT/INR  Recent Labs  10/22/16 0357 10/23/16 0405  LABPROT 25.1* 22.8*  INR 2.24 1.98   ABG No results for input(s): PHART, HCO3 in the last 72 hours.  Invalid input(s): PCO2, PO2  Studies/Results: No results found.  Anti-infectives: Anti-infectives    Start     Dose/Rate Route Frequency Ordered Stop   10/21/16 0800  ciprofloxacin (CIPRO) tablet 500 mg     500 mg Oral 2 times daily 10/21/16 0755     10/21/16 0800  metroNIDAZOLE (FLAGYL) tablet 500 mg     500 mg Oral Every 8 hours 10/21/16 0755     10/17/16 0900  piperacillin-tazobactam (ZOSYN) IVPB 3.375 g  Status:  Discontinued     3.375 g 12.5 mL/hr over 240 Minutes Intravenous Every 8 hours 10/17/16 0816 10/21/16 0755      Assessment/Plan: s/p * No surgery found *  Acute cholecystitis s/p IR placed perc drain. Abdomen stable from gen surg standpoint. Will keep the drain in approx 6 to  8 weeks. Nothing further for [place] to offer. Will sign off and see as an outpatient. Follow up  with me as an outpt  LOS: 10 days    Didi Ganaway A 10/23/2016

## 2016-10-23 NOTE — Clinical Social Work Placement (Signed)
   CLINICAL SOCIAL WORK PLACEMENT  NOTE  Date:  10/23/2016  Patient Details  Name: [name] [name] MRN: [id number] Date of Birth: 10/18/1945  Clinical Social Work is seeking post-discharge placement for this patient at the [place] level of care (*CSW will initial, date and re-position this form in  chart as items are completed):  Yes   Patient/family provided with [facility] Work Department's list of facilities offering this level of care within the geographic area requested by the patient (or if unable, by the patient's family).  Yes   Patient/family informed of their freedom to choose among providers that offer the needed level of care, that participate in Medicare, Medicaid or managed care program needed by the patient, have an available bed and are willing to accept the patient.  Yes   Patient/family informed of [facility]'s ownership interest in [facility] and [facility], as well as of the fact that they are under no obligation to receive care at these facilities.  PASRR submitted to EDS on       PASRR number received on       Existing PASRR number confirmed on 10/21/16     FL2 transmitted to all facilities in geographic area requested by pt/family on 10/21/16     FL2 transmitted to all facilities within larger geographic area on       Patient informed that his/her managed care company has contracts with or will negotiate with certain facilities, including the following:        Yes   Patient/family informed of bed offers received.  Patient chooses bed at [place], [place]     Physician recommends and patient chooses bed at      Patient to be transferred to [facility] [place], [place] on 10/23/16.  Patient to be transferred to facility by Ambulance     Patient family notified on 10/23/16 of transfer.  Name of family member notified:  Enid Derry     PHYSICIAN Please prepare priority discharge summary, including  medications, Please prepare prescriptions, Please sign FL2     Additional Comment:    _______________________________________________ Rigoberto Noel, LCSW 10/23/2016, 4:29 PM

## 2016-10-23 NOTE — Progress Notes (Signed)
Patient ID: [name] [name], male   DOB: 1946/01/20, 71 y.o.   MRN: [id number]     Advanced Heart Failure Rounding Note  PCP: Dr. Inda Merlin Primary Cardiologist: Dr. Caryl Comes   Subjective:    Admitted on 10/13/16 with SOB, acute on chronic systolic CHF (EF 0000000). Also with abdominal pain and nausea/vomiting. Abdominal US and CT with markedly distended gallbladder suggestive of acute cholecystitis, had percutaneous drain placed on 10/17/16.   Looks great today, more alert, denies abdominal pain. No SOB, need to mobilize today.   Restarted Arlyce Harman 12.5 and Lasix 40mg  daily yesterday, BMET not done today. Entresto restarted on 10/21/16, BP stable at 110's.    Plan to go to SNF.    Objective:   Weight Range: 202 lb 1.6 oz (91.7 kg) Body mass index is 29.84 kg/m.   Vital Signs:   Temp:  [98.3 F (36.8 C)-99.3 F (37.4 C)] 98.4 F (36.9 C) (03/01 0516) Pulse Rate:  [72-78] 73 (03/01 0516) Resp:  [17-24] 21 (03/01 0516) BP: (101-113)/(50-66) 113/57 (03/01 0516) SpO2:  [92 %-98 %] 95 % (03/01 0516) Weight:  [202 lb 1.6 oz (91.7 kg)] 202 lb 1.6 oz (91.7 kg) (03/01 0516) Last BM Date: 10/20/16  Weight change: Filed Weights   10/21/16 0626 10/22/16 0507 10/23/16 0516  Weight: 196 lb 6.4 oz (89.1 kg) 199 lb 14.4 oz (90.7 kg) 202 lb 1.6 oz (91.7 kg)    Intake/Output:   Intake/Output Summary (Last 24 hours) at 10/23/16 0748 Last data filed at 10/23/16 0500  Gross per 24 hour  Intake             1560 ml  Output             1750 ml  Net             -190 ml     Physical Exam: General: Alert, denies abdominal pain.   HEENT: normal Neck: supple. JVP 5-6 cm. Carotids 2+ bilat; no bruits. No lymphadenopathy or thyromegaly appreciated. Cor: PMI nondisplaced. Regular rate & rhythm. No rubs, gallops or murmurs. Lungs: CTA Abdomen: soft, non tender, nondistended. No hepatosplenomegaly. No bruits or masses. Good bowel sounds. Drain in place.  Extremities: no cyanosis, clubbing, rash.  Bilateral BKA. No thigh edema Neuro: Alert and oriented x 4.   Telemetry: V paced, RBBB  Labs: CBC  Recent Labs  10/21/16 0453 10/22/16 0357  WBC 9.1 9.9  HGB 12.0* 11.0*  HCT 36.5* 32.7*  MCV 74.2* 73.2*  PLT 154 0000000   Basic Metabolic Panel  Recent Labs  10/21/16 0453 10/22/16 0357  NA 147* 141  K 3.1* 3.7  CL 108 105  CO2 29 26  GLUCOSE 235* 248*  BUN 42* 42*  CREATININE 1.78* 1.70*  CALCIUM 8.2* 7.8*   BNP (last 3 results)  Recent Labs  12/28/15 0550 10/13/16 1401  BNP 380.3* 1,039.0*      Medications:     Scheduled Medications: . atorvastatin  10 mg Oral Daily  . carvedilol  6.25 mg Oral BID WC  . ciprofloxacin  500 mg Oral BID  . feeding supplement (GLUCERNA SHAKE)  237 mL Oral TID BM  . furosemide  40 mg Oral Daily  . insulin aspart  0-9 Units Subcutaneous TID WC  . insulin detemir  15 Units Subcutaneous BID  . metroNIDAZOLE  500 mg Oral Q8H  . pregabalin  75 mg Oral QHS  . sacubitril-valsartan  1 tablet Oral BID  . spironolactone  12.5 mg Oral  Daily  . Warfarin - Pharmacist Dosing Inpatient   Does not apply q1800    PRN Medications: acetaminophen, guaiFENesin-dextromethorphan, morphine injection, ondansetron   Assessment/Plan   1. Acute on chronic combined diastolic and systolic CHF, NYHA class IIIb: NICM with CRT-D. Cardiac meds held with acute cholecystitis/early sepsis picture. Now restarting.  - Coreg and Entresto resumed.   - Continue Lasix 40 mg daily.  - Continue Arlyce Harman 12.5mg  daily.  - Needs BMET today, ordered.  2. PAF, s/p CRT-D: NSR. Continue warfarin.  3. Acute cholecystitis with sepsis:  s/p percutaneous gallbladder drain. Enterococcus durans from bile.  Surgery following.  - Continue Cipro/Flagyl.  4. CKD stage III: Baseline creatinine 1.40-1.42. Slow improvement but needs BMET today.   Length of Stay: [place], NP  10/23/2016, 7:48 AM  Advanced Heart Failure Team Pager [phone] (M-F; 7a - 4p)  Please  contact [facility] for night-coverage after hours (4p -7a ) and weekends on [email or web address]  Patient seen with NP, agree with the above note.  He is stable today, back on cardiac meds.  Volume status looks ok.  Needs BMET, will order.  We have followup for him in CHF clinic.   Plan for SNF soon.   Loralie Champagne 10/23/2016 10:02 AM

## 2016-10-24 LAB — AEROBIC/ANAEROBIC CULTURE W GRAM STAIN (SURGICAL/DEEP WOUND): Special Requests: NORMAL

## 2016-10-24 LAB — SUSCEPTIBILITY, AER + ANAEROB

## 2016-10-24 LAB — AEROBIC/ANAEROBIC CULTURE (SURGICAL/DEEP WOUND)

## 2016-10-24 LAB — SUSCEPTIBILITY RESULT

## 2016-10-30 ENCOUNTER — Other Ambulatory Visit: Payer: Self-pay | Admitting: General Surgery

## 2016-10-30 DIAGNOSIS — K810 Acute cholecystitis: Secondary | ICD-10-CM

## 2016-10-31 ENCOUNTER — Encounter: Payer: Self-pay | Admitting: Physician Assistant

## 2016-11-04 ENCOUNTER — Telehealth: Payer: Self-pay | Admitting: Physician Assistant

## 2016-11-04 NOTE — Telephone Encounter (Signed)
Patient's daughter is calling on behalf of patient states that he is scheduled to have a transfusion on 11-06-16 and it will take 6 hours. Ms. Ruhlman is concerned that the back-to- back appts may be too much on patient. She would like to know if she needs to keep appt scheduled 11-07-16 or if it is okay to come in at a later date. Please call, thanks.

## 2016-11-05 ENCOUNTER — Telehealth: Payer: Self-pay | Admitting: *Deleted

## 2016-11-05 NOTE — Telephone Encounter (Signed)
Called Mrs. Polo back to inform her that while pt is in [facility] they monitor his INR, therefore, will cancel 11/07/16 appt with [facility] at this time. Also, informed her that the scheduler for [facility] will call her to reschedule the Provide appt per [facility]. Wife states she will be awaiting call & was gracious for the assistance.  Also, reminded wife to call once pt is d/c from [facility] so we can get him set up back in the office.

## 2016-11-05 NOTE — Telephone Encounter (Signed)
Follow up    Pt daughter is asking about appointment for Friday-if it's ok to reschedule due to pt having blood transfusion or if he needs to keep it.

## 2016-11-05 NOTE — Telephone Encounter (Signed)
Pt is scheduled for an INR recheck on 11/07/16, pt has been in the hospital & was d/c on Cipro 500mg  & Flagyl 500mg . Spoke with Santiago Glad at [facility] she stated their house doctor has been dosing the pt's INR.  Therefore, will cancel pt's appt in [facility] at this time & advised to have them call us once pt is discharged.

## 2016-11-05 NOTE — Telephone Encounter (Signed)
SPOKE TO WIFE ABOUT CONCERNS OF RESCHEDULING AND THAT SHE WILL BE CONTACTED BACK WITH A NEW APPOINTMENT.

## 2016-11-06 NOTE — Progress Notes (Signed)
Cardiology Office Note Date:  11/07/2016  Patient ID:  [name], [name] [birth year], MRN [id number] PCP:  [name], MD  Electrophysiologist:  Dr. Caryl Comes  Chief Complaint: routine EP visit  History of Present Illness: [name] [name] is a 71 y.o. male with history of NICM w/CRT-D, chronic CHF/systolic, PAFib, NSVT, HLD, PVD with b/l BKA's, arthritis, HTN, DM, CRI stage III.  Discharged from [facility] s/p hip fracture (trip fall in his garden) and surgery 12/28/15.  He was discharged from the hospital after admission with acute cholecystitis/sepsis requiring drain placement, developed a CHF exacerbation, admitted 10/13/16 discharged 10/23/16, treated by CHF team, during ths stay was again noted that he had underdetected  Slow VT and VT zone reduced to 130bpm to treat w/ATP.  He comes today to be seen for Dr. Caryl Comes for a previously planned f/u prior to his hospital stay with intentions of titrating his Delene Loll, though had the above hospitalization since then.  The patient is accompanied by his wife, he is currently at SNF for rehab, they report that despite PT he remains with significant functional decline and generalized weakness, he has been having recurrent diarrhea and remains with his GB drain in place.  His wife reports that they told her he had an infection in his colon and was started on Abx tx (Cdiff was reported by telephone from the patient's SNF RN). He sees CHF team next week, as well as IR for f/u imaging, they hope he will be able to have his GB out soon.  He remains with intermittent abd discomfort that he attributes to the diarrhea.  He is being evaluated via the SNF for R hand swelling as well.   Cardiac-wise, he denies any CP, palpitations , no rest SOB, but gets winded, at baseline from his discharge, no nighttime symptoms of PND or orthopnea.  No dizziness, near syncope or syncope  He denies any bleeding or signs of bleeding, he has not noted blood in his stool.  His  INR is currently being managed via SNF MD.   DEVICE information: BSCi CRT-D implanted 12/22/03, gen change 07/05/09, primary prevention AAD tx: no Appropriate therapy: the patient denies any known shocks   Past Medical History:  Diagnosis Date  . Anemia    takes Iron pill daily  . Arthritis   . Automatic implantable cardioverter-defibrillator in situ    a. 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  . CAD (coronary artery disease)   . Cardiomyopathy, nonischemic ([place])    a. Cath 2003: mild nonobstructive CAD, EF 25% at that time.  . Chronic systolic CHF (congestive heart failure) (HCC)    takes Furosemide daily as well as Aldactone  . CKD (chronic kidney disease), stage III   . Complication of anesthesia   . Constipation    takes Sennokot daily  . Dementia   . GERD (gastroesophageal reflux disease)    takes Nexium daily  . Headache    occasionally  . High cholesterol    takes Atorvastatin daily  . History of blood transfusion    no abnormal reaction noted  . History of bronchitis    > 1 yr ago  . History of kidney stones   . Hypertension    takes Coreg daily  . LBBB (left bundle branch block)    takes Coumadin daily  . NSVT (nonsustained ventricular tachycardia) ([place])    a. Noted on ICD interrogation in 2011.  [name] PAD (peripheral artery disease) ([place])  a. 08/2013 Periph Angio/PTA: Abd Ao nl, RLE- 3v runoff, PT diff dzs, AT 90p, LLE 2v runoff, PT 100, AT 62m (diamondback ORA/chocolate balloon PTA).  [name] PAF (paroxysmal atrial fibrillation) ([place])    a. Noted on ICD interrogation 2012;  b. coumadin d/c'd 01/2013.  [name] PAF (paroxysmal atrial fibrillation) ([place])   . Peripheral neuropathy (HCC)    hands;numbness and tingling   . Pneumonia    hx of > 21yr ago  . PONV (postoperative nausea and vomiting)   . Type II diabetes mellitus ([place])    takes Levemir daily as well as Novolog  . Urinary frequency   . Urinary urgency     Past Surgical History:    Procedure Laterality Date  . ABDOMINAL ANGIOGRAM  09/12/2013   Procedure: ABDOMINAL ANGIOGRAM;  Surgeon: Lorretta Harp, MD;  Location: [facility] CATH LAB;  Service: Cardiovascular;;  . AMPUTATION Left 10/04/2013   Procedure: LEFT GREAT TOE AND SECOND TOE AMPUTATION;  Surgeon: Marianna Payment, MD;  Location: [place];  Service: Orthopedics;  Laterality: Left;  . AMPUTATION Left 11/09/2013   Procedure: LEFT AMPUTATION BELOW KNEE;  Surgeon: Marianna Payment, MD;  Location: [place];  Service: Orthopedics;  Laterality: Left;  . AMPUTATION Right 01/04/2014   Procedure: Doristine Devoid second and third toe amputation;  Surgeon: Marianna Payment, MD;  Location: [place];  Service: Orthopedics;  Laterality: Right;  . AMPUTATION Right 01/30/2014   Procedure: RIGHT BELOW KNEE AMPUTATION;  Surgeon: Marianna Payment, MD;  Location: [place];  Service: Orthopedics;  Laterality: Right;  . ATHERECTOMY Right 12/29/2013   Procedure: ATHERECTOMY;  Surgeon: Lorretta Harp, MD;  Location[facility] CATH LAB;  Service: Cardiovascular;  Laterality: Right;  Anterior Tibial Artery  . CARDIAC CATHETERIZATION  10/01/2001   THERE WAS GLOBAL HYPOKINESIS AND EF 25%. THERE APPEARED TO BE GLOBAL DECREASE IN WALL MOTION  . CARDIAC DEFIBRILLATOR PLACEMENT  06/2009   WITH GENERATOR REPLACED; BiV ICD  . CARDIOVASCULAR STRESS TEST  03/20/2009   EF 33%  . [place]  . COLONOSCOPY    . ESOPHAGOGASTRODUODENOSCOPY    . HIP ARTHROPLASTY Left 12/28/2015   Procedure: POSTERIOR  APPROACH HEMI HIP ARTHROPLASTY;  Surgeon: Leandrew Koyanagi, MD;  Location: [place];  Service: Orthopedics;  Laterality: Left;  . IR GENERIC HISTORICAL  10/18/2016   IR PERC CHOLECYSTOSTOMY 10/18/2016 [name], MD [facility]-INTERV RAD  . LEG AMPUTATION BELOW KNEE Left 11/09/2013   DR Erlinda Hong  . LITHOTRIPSY  2001  . LOWER EXTREMITY ANGIOGRAM Bilateral 09/12/2013   Procedure: LOWER EXTREMITY ANGIOGRAM;  Surgeon: Lorretta Harp, MD;  Location: [facility] CATH LAB;  Service: Cardiovascular;   Laterality: Bilateral;  . LOWER EXTREMITY ANGIOGRAM N/A 12/29/2013   Procedure: LOWER EXTREMITY ANGIOGRAM;  Surgeon: Lorretta Harp, MD;  Location: [facility] CATH LAB;  Service: Cardiovascular;  Laterality: N/A;  . STUMP REVISION Left 01/03/2015   Procedure: LEFT BELOW KNEE AMPUTATION REVISION;  Surgeon: Leandrew Koyanagi, MD;  Location: [place];  Service: Orthopedics;  Laterality: Left;  . TOE AMPUTATION  10/04/2013   LEFT GREAT TOE AND 4TH TOE   /   DR Erlinda Hong  . TRANSLUMINAL ATHERECTOMY TIBIAL ARTERY Left 09/12/2013  . US ECHOCARDIOGRAPHY  03/21/2008   EF 30-35%    Current Outpatient Prescriptions  Medication Sig Dispense Refill  . acetaminophen (TYLENOL) 325 MG tablet Take 2 tablets (650 mg total) by mouth every 6 (six) hours as needed for mild pain (or Fever >/= 101).    [name]  atorvastatin (LIPITOR) 10 MG tablet Take 1 tablet (10 mg total) by mouth daily. 30 tablet 1  . carvedilol (COREG) 6.25 MG tablet Take 1 tablet (6.25 mg total) by mouth 2 (two) times daily with a meal. 60 tablet 0  . Cholecalciferol (VITAMIN D3) 10000 units TABS Take 1 tablet by mouth daily.    . furosemide (LASIX) 40 MG tablet Take 1 tablet (40 mg total) by mouth daily. (Patient taking differently: Take 40-80 mg by mouth daily. Monday,wednesday,friday, Saturday ( 80 mg 4  Days a week ) all other days 40 mg) 30 tablet 1  . insulin detemir (LEVEMIR) 100 UNIT/ML injection Inject 0.15 mLs (15 Units total) into the skin 2 (two) times daily. 10 mL 0  . iron polysaccharides (NIFEREX) 150 MG capsule Take 1 capsule (150 mg total) by mouth daily. 30 capsule 1  . Multiple Vitamin (MULTIVITAMIN WITH MINERALS) TABS tablet Take 1 tablet by mouth daily.    [name] omeprazole (PRILOSEC) 20 MG capsule Take 20 mg by mouth daily.    . pregabalin (LYRICA) 75 MG capsule Take 1 capsule (75 mg total) by mouth 2 (two) times daily. 60 capsule 1  . sacubitril-valsartan (ENTRESTO) 24-26 MG Take 1 tablet by mouth 2 (two) times daily. 60 tablet 6  . spironolactone  (ALDACTONE) 25 MG tablet Take 0.5 tablets (12.5 mg total) by mouth daily. 30 tablet 0  . warfarin (COUMADIN) 10 MG tablet Take 1 tablet (10 mg total) by mouth daily. 30 tablet 0   No current facility-administered medications for this visit.     Allergies:   Patient has no known allergies.   Social History:  The patient  reports that he has never smoked. He has never used smokeless tobacco. He reports that he does not drink alcohol or use drugs.   Family History:  The patient's family history includes Diabetes in his father and mother; Heart disease in his mother; Hypertension in his mother.  ROS:  Please see the history of present illness. All other systems are reviewed and otherwise negative.   PHYSICAL EXAM:  VS:  BP (!) 95/58   Pulse 98   Ht 5\' 9"  (1.753 m)   Wt 214 lb (97.1 kg)   BMI 31.60 kg/m  BMI: Body mass index is 31.6 kg/m. Well nourished, well developed, in no acute distress  HEENT: normocephalic, atraumatic  Neck: no JVD, carotid bruits or masses Cardiac:  RRR; no significant murmurs, no rubs, or gallops Lungs:  CTA b/l, no wheezing, rhonchi or rales  Abd: soft, non-tender to light palpations, dressing R abdomen MS: no deformity or atrophy Ext: b/l BKA's, prosthetics in place Skin: warm and dry, no rash Neuro:  No gross deficits appreciated Psych: euthymic mood, full affect  ICD site is stable, no tethering or discomfort   EKG:  Done 12/27/15 shows SR, IVCD ICD check today with industry is reviewed by myself: battery is good, NSVT only,  no therapies, battery and lead measurements are stable, 22 Atrial arrhythmia events < 1 minute,  94% BiVe paced  10/14/16: TTE Impressions: - Mildly dilated LV with EF 20-25%, diffuse hypokinesis. Moderate   diastolic dysfunction. Normal RV size with mildly decreased   systolic function. Mild to moderate MR. Borderline pulmonary   hypertension.  02/15/13: Echocardiogram Study Conclusions - Left ventricle: The cavity size was  mildly dilated. Wall thickness was normal. Systolic function was severely reduced. The estimated ejection fraction was in the range of 20% to 25%. Diffuse hypokinesis. Doppler parameters are  consistent with abnormal left ventricular relaxation (grade 1 diastolic dysfunction). - Mitral valve: Mild regurgitation. - Left atrium: The atrium was mildly dilated.   Recent Labs: 04/30/2016: TSH 2.516 10/13/2016: B Natriuretic Peptide 1,039.0 10/17/2016: Magnesium 2.2 10/22/2016: ALT 39; Hemoglobin 11.0; Platelets 162 10/23/2016: BUN 38; Creatinine, Ser 1.52; Potassium 3.8; Sodium 139  No results found for requested labs within last 8760 hours.   Estimated Creatinine Clearance: 49.8 mL/min (A) (by C-G formula based on SCr of 1.52 mg/dL (H)).   Wt Readings from Last 3 Encounters:  11/07/16 214 lb (97.1 kg)  10/23/16 202 lb 1.6 oz (91.7 kg)  09/26/16 218 lb (98.9 kg)     Other studies reviewed: Additional studies/records reviewed today include: summarized above    ASSESSMENT AND PLAN:  1.  NICM, recent acute on chronic systolic CHF, ICD      Recent exacerbation during a stay with acute cholecystis/sepsis      f/u in place with CHF team next week, I will not make any adjustments at this time      His exam appears euvolemic/lungs are clear      On BB, entresto, lasix, aldactone      Stable ICD function, no changes made      [place] remotes Q 3 months  2. PAFib     CHA2DS2Vasc is 5 on warfarin, monitored and managed by coumadin clinic   3. CRI stage III     4. Anemia     C/w PMD  5. PVD s/p b/l BKAs     He has been maintained on Plavix, no ASA       Disposition: The patient/wife are given instruction regarding hand washing and Cdiff precautions, he will continue with IR, surgery service and CHF team.  We will see him from a device standpoint in 6 months, sooner if needed.  Current medicines are reviewed at length with the patient today.  The patient did not have any  concerns regarding medicines.  Haywood Lasso, PA-C 11/07/2016 7:18 PM     [address] [place] [address] [phone] (office)  [phone] (fax)

## 2016-11-07 ENCOUNTER — Ambulatory Visit (INDEPENDENT_AMBULATORY_CARE_PROVIDER_SITE_OTHER): Payer: Medicare Other | Admitting: Physician Assistant

## 2016-11-07 ENCOUNTER — Encounter (HOSPITAL_COMMUNITY): Payer: Medicare Other

## 2016-11-07 VITALS — BP 95/58 | HR 98 | Ht 69.0 in | Wt 214.0 lb

## 2016-11-07 DIAGNOSIS — I5023 Acute on chronic systolic (congestive) heart failure: Secondary | ICD-10-CM | POA: Diagnosis not present

## 2016-11-07 DIAGNOSIS — I4891 Unspecified atrial fibrillation: Secondary | ICD-10-CM

## 2016-11-07 DIAGNOSIS — I480 Paroxysmal atrial fibrillation: Secondary | ICD-10-CM

## 2016-11-07 DIAGNOSIS — I420 Dilated cardiomyopathy: Secondary | ICD-10-CM

## 2016-11-07 DIAGNOSIS — Z95810 Presence of automatic (implantable) cardiac defibrillator: Secondary | ICD-10-CM

## 2016-11-07 NOTE — Patient Instructions (Signed)
Medication Instructions:   Your physician recommends that you continue on your current medications as directed. Please refer to the Current Medication list given to you today.   If you need a refill on your cardiac medications before your next appointment, please call your pharmacy.  Labwork: NONE ORDERED  TODAY    Testing/Procedures: NONE ORDERED  TODAY    Follow-Up:  Your physician wants you to follow-up in:  IN  6  MONTHS WITH DR Gari Crown will receive a reminder letter in the mail two months in advance. If you don't receive a letter, please call our office to schedule the follow-up appointment.   Remote monitoring is used to monitor your Pacemaker of ICD from home. This monitoring reduces the number of office visits required to check your device to one time per year. It allows [place] to keep an eye on the functioning of your device to ensure it is working properly. You are scheduled for a device check from home on.[name]6-18-2018- You may send your transmission at any time that day. If you have a wireless device, the transmission will be sent automatically. After your physician reviews your transmission, you will receive a postcard with your next transmission date

## 2016-11-10 ENCOUNTER — Encounter (HOSPITAL_COMMUNITY): Payer: Self-pay

## 2016-11-10 ENCOUNTER — Ambulatory Visit (HOSPITAL_COMMUNITY)
Admit: 2016-11-10 | Discharge: 2016-11-10 | Disposition: A | Discharge status: Home / Self Care | Payer: Medicare Other | Attending: Cardiology | Admitting: Cardiology

## 2016-11-10 VITALS — BP 119/59 | HR 87

## 2016-11-10 DIAGNOSIS — I429 Cardiomyopathy, unspecified: Secondary | ICD-10-CM | POA: Insufficient documentation

## 2016-11-10 DIAGNOSIS — K819 Cholecystitis, unspecified: Secondary | ICD-10-CM | POA: Insufficient documentation

## 2016-11-10 DIAGNOSIS — Z89512 Acquired absence of left leg below knee: Secondary | ICD-10-CM | POA: Diagnosis not present

## 2016-11-10 DIAGNOSIS — N183 Chronic kidney disease, stage 3 (moderate): Secondary | ICD-10-CM | POA: Insufficient documentation

## 2016-11-10 DIAGNOSIS — Z7901 Long term (current) use of anticoagulants: Secondary | ICD-10-CM | POA: Insufficient documentation

## 2016-11-10 DIAGNOSIS — Z833 Family history of diabetes mellitus: Secondary | ICD-10-CM | POA: Insufficient documentation

## 2016-11-10 DIAGNOSIS — I5023 Acute on chronic systolic (congestive) heart failure: Secondary | ICD-10-CM | POA: Diagnosis not present

## 2016-11-10 DIAGNOSIS — Z79899 Other long term (current) drug therapy: Secondary | ICD-10-CM | POA: Insufficient documentation

## 2016-11-10 DIAGNOSIS — Z96649 Presence of unspecified artificial hip joint: Secondary | ICD-10-CM | POA: Diagnosis not present

## 2016-11-10 DIAGNOSIS — I5022 Chronic systolic (congestive) heart failure: Secondary | ICD-10-CM

## 2016-11-10 DIAGNOSIS — Z89511 Acquired absence of right leg below knee: Secondary | ICD-10-CM | POA: Diagnosis not present

## 2016-11-10 DIAGNOSIS — Z794 Long term (current) use of insulin: Secondary | ICD-10-CM | POA: Diagnosis not present

## 2016-11-10 DIAGNOSIS — Z8249 Family history of ischemic heart disease and other diseases of the circulatory system: Secondary | ICD-10-CM | POA: Insufficient documentation

## 2016-11-10 DIAGNOSIS — I480 Paroxysmal atrial fibrillation: Secondary | ICD-10-CM

## 2016-11-10 LAB — BASIC METABOLIC PANEL
ANION GAP: 9 (ref 5–15)
BUN: 15 mg/dL (ref 6–20)
CALCIUM: 8.1 mg/dL — AB (ref 8.9–10.3)
CO2: 25 mmol/L (ref 22–32)
Chloride: 106 mmol/L (ref 101–111)
Creatinine, Ser: 1.11 mg/dL (ref 0.61–1.24)
GFR calc Af Amer: >60 mL/min (ref >60)
GLUCOSE: 220 mg/dL — AB (ref 65–99)
POTASSIUM: 4.3 mmol/L (ref 3.5–5.1)
SODIUM: 140 mmol/L (ref 135–145)

## 2016-11-10 LAB — BRAIN NATRIURETIC PEPTIDE: B NATRIURETIC PEPTIDE 5: 866.4 pg/mL — AB (ref 0.0–100.0)

## 2016-11-10 MED ORDER — SPIRONOLACTONE 25 MG PO TABS: 25.0000 mg | ORAL_TABLET | Freq: Every day | ORAL | 3 refills | Status: DC

## 2016-11-10 NOTE — Patient Instructions (Addendum)
Increase Spironolactone to 25 mg daily  Labs in 10 days  Your physician recommends that you schedule a follow-up appointment in: 1 month

## 2016-11-10 NOTE — Progress Notes (Signed)
PCP: Dr. Marcellus Scott HF Cardiology: Dr. Aundra Dubin  71 yo with history of CKD stage III, paroxysmal atrial fibrillation, chronic systolic CHF (nonischemic cardiomyopathy) and s/p bilateral BKAs presents for cardiology followup.  Patient has a long history of CHF.  He has a Chemical engineer CRT-D device.  He had bilateral BKAs because of gangrene and walks on prostheses.  He was admitted in 2/18 with acute on chronic systolic CHF and also found to have acute cholecystitis.  Cholecystostomy tube was placed and he was treated with antibiotics. He was discharged to a SNF.    At the SNF, he developed C difficile diarrhea which has been treated.  Mild abdominal discomfort.  He is walking with a walker with assistance.  He is short of breath and tired after walking about 40 feet.  2 pillow orthopnea chronically.  No chest pain.    ECG (2/18, personally reviewed): NSR, BiV pacing  Labs (3/18): K 3.8, creatinine 1.5  PMH: 1. CKD: Stage III. 2. Atrial fibrillation: Paroxysmal.  3. Chronic systolic CHF: Nonischemic cardiomyopathy.  LHC in 2003 with nonobstructive disease.  [employer] CRT-D device.  - Echo (6/14) with EF 20-25%.  - Echo (2/18) with EF 20-25%, grade II diastolic dysfunction, mild-moderate MR, mildly decreased RV systolic function.  4. 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  5. h/o VT 6. Acute cholecystitis: 2/18.  Cholecystostomy tube placed.  7. Hip replacement 5/17.   Social History   Social History  . Marital status: Married    Spouse name: N/A  . Number of children: N/A  . Years of education: N/A   Occupational History  . Not on file.   Social History Main Topics  . Smoking status: Never Smoker  . Smokeless tobacco: Never Used  . Alcohol use No  . Drug use: No  . Sexual activity: Not Currently   Other Topics Concern  . Not on file   Social History Narrative  . No narrative on file   Family History  Problem Relation Age of Onset  . Heart disease Mother   .  Hypertension Mother   . Diabetes Mother   . Diabetes Father    ROS: All systems reviewed and negative except as per HPI.  Current Outpatient Prescriptions  Medication Sig Dispense Refill  . acetaminophen (TYLENOL) 325 MG tablet Take 2 tablets (650 mg total) by mouth every 6 (six) hours as needed for mild pain (or Fever >/= 101).    [name] atorvastatin (LIPITOR) 10 MG tablet Take 1 tablet (10 mg total) by mouth daily. 30 tablet 1  . carvedilol (COREG) 6.25 MG tablet Take 1 tablet (6.25 mg total) by mouth 2 (two) times daily with a meal. 60 tablet 0  . Cholecalciferol (VITAMIN D3) 10000 units TABS Take 1 tablet by mouth daily.    . furosemide (LASIX) 40 MG tablet Take 1 tablet (40 mg total) by mouth daily. 30 tablet 1  . insulin detemir (LEVEMIR) 100 UNIT/ML injection Inject 0.15 mLs (15 Units total) into the skin 2 (two) times daily. 10 mL 0  . iron polysaccharides (NIFEREX) 150 MG capsule Take 1 capsule (150 mg total) by mouth daily. 30 capsule 1  . Multiple Vitamin (MULTIVITAMIN WITH MINERALS) TABS tablet Take 1 tablet by mouth daily.    [name] omeprazole (PRILOSEC) 20 MG capsule Take 20 mg by mouth daily.    . pregabalin (LYRICA) 75 MG capsule Take 1 capsule (75 mg total) by mouth 2 (two) times daily. 60 capsule 1  . sacubitril-valsartan (  ENTRESTO) 24-26 MG Take 1 tablet by mouth 2 (two) times daily. 60 tablet 6  . spironolactone (ALDACTONE) 25 MG tablet Take 1 tablet (25 mg total) by mouth daily. 30 tablet 3  . warfarin (COUMADIN) 10 MG tablet Take 1 tablet (10 mg total) by mouth daily. 30 tablet 0   No current facility-administered medications for this encounter.    BP (!) 119/59   Pulse 87   SpO2 100%  General: NAD Neck: No JVD, no thyromegaly or thyroid nodule.  Lungs: Clear to auscultation bilaterally with normal respiratory effort. CV: Nondisplaced PMI.  Heart regular S1/S2, no S3/S4, no murmur.  No carotid bruit.  Abdomen: Soft, nontender, no hepatosplenomegaly, no distention.  Skin:  Intact without lesions or rashes.  Neurologic: Alert and oriented x 3.  Psych: Normal affect. Extremities: No clubbing or cyanosis. S/p bilateral BKAs with prostheses in place.  HEENT: Normal.   Assessment/Plan: 1. Chronic systolic CHF: Echo (1/22) with EF 20-25%, mildly decreased RV systolic function. This is similar to the past.  Nonischemic cardiomyopathy.  He has a Chemical engineer CRT-D device.  NYHA class III symptoms.  He is not significantly volume overloaded on exam.  - Continue Lasix 40 mg daily. BMET today.  - Continue Entresto 24/26 bid and Coreg 6.25 mg bid. - Increase spironolactone to 25 mg daily with BMET in 2 wks.   2. CKD: Stage 3.  BMET today.  3. Atrial fibrillation: Paroxysmal. He is in NSR by exam.  Continue warfarin.  4. Cholecystitis: Recent admission with acute cholecystitis.  He has a cholecystostomy tube. He appears stable from a CHF standpoint though still more weak than his baseline after recent hospitalization.  Of note, he tolerated anesthesia for hip repair in 5/17.  - I think he would be a candidate for laparoscopic cholecystectomy eventually, would just be higher risk than average (not prohibitive).  5. Deconditioning: I think it would be reasonable for him to stay longer at SNF for PT work.   Loralie Champagne 11/10/2016

## 2016-11-18 ENCOUNTER — Other Ambulatory Visit: Payer: Self-pay | Admitting: Internal Medicine

## 2016-11-19 LAB — PROTIME-INR: INR: 4.2 — AB (ref 0.9–1.1)

## 2016-11-20 ENCOUNTER — Ambulatory Visit (INDEPENDENT_AMBULATORY_CARE_PROVIDER_SITE_OTHER): Payer: Medicare Other | Admitting: Cardiology

## 2016-11-20 ENCOUNTER — Other Ambulatory Visit: Payer: Self-pay | Admitting: General Surgery

## 2016-11-20 ENCOUNTER — Ambulatory Visit
Admission: RE | Admit: 2016-11-20 | Discharge: 2016-11-20 | Disposition: A | Discharge status: Home / Self Care | Payer: Medicare Other | Source: Ambulatory Visit | Attending: General Surgery | Admitting: General Surgery

## 2016-11-20 DIAGNOSIS — I4891 Unspecified atrial fibrillation: Secondary | ICD-10-CM

## 2016-11-20 DIAGNOSIS — K810 Acute cholecystitis: Secondary | ICD-10-CM

## 2016-11-20 DIAGNOSIS — Z5181 Encounter for therapeutic drug level monitoring: Secondary | ICD-10-CM

## 2016-11-20 HISTORY — PX: IR RADIOLOGIST EVAL & MGMT: IMG5224

## 2016-11-20 NOTE — Progress Notes (Signed)
Referring Physician(s):  Dr. Judeth Horn  Chief Complaint: The patient is seen in follow up today s/p percutaneous cholecystostomy 10/18/16  History of present illness: [name] [name] is a 71 y.o. male PMH NICM s/pICD, CHFwith EF to 20-25%, DM-2, CKD, PAF on coumadin, h/o bilateral BKA, admitted to [facility] 10/13/16 with CHF exacerbation. He was also complaining of abdominal pain and diarrhea.  CT Abd Pelvis 2/19 was unrevealing, however repeat CT Abd Pelvis 2/23 showed distended gallbladder with small gallstones and sludge.  Acute cholecystitis identified by [place].  IR consulted for percutaneous cholecystostomy at the request of Dr. Judeth Horn.  Patient underwent successful drain placement 10/18/16.  He was discharged to SNF  facility 10/23/16 with plans for follow-up with surgery. Since discharge, patient has continued antibiotics.  He also had a bout of C. Diff which he states has improved.  He has returned home with home health assisting with drain care.    The patient presents to IR clinic today for follow-up of his perc chole drain. He continues with thick, brown output; wife estimates ~100 mL output/day. The patient has been stable at home, although admits he has not regained his strength from hospitalization.  He has an appointment with surgery scheduled for 11/25/16.   Past Medical History:  Diagnosis Date  . Anemia    takes Iron pill daily  . Arthritis   . Automatic implantable cardioverter-defibrillator in situ    a. 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  . CAD (coronary artery disease)   . Cardiomyopathy, nonischemic ([place])    a. Cath 2003: mild nonobstructive CAD, EF 25% at that time.  . Chronic systolic CHF (congestive heart failure) (HCC)    takes Furosemide daily as well as Aldactone  . CKD (chronic kidney disease), stage III   . Complication of anesthesia   . Constipation    takes Sennokot daily  . Dementia   . GERD (gastroesophageal reflux  disease)    takes Nexium daily  . Headache    occasionally  . High cholesterol    takes Atorvastatin daily  . History of blood transfusion    no abnormal reaction noted  . History of bronchitis    > 1 yr ago  . History of kidney stones   . Hypertension    takes Coreg daily  . LBBB (left bundle branch block)    takes Coumadin daily  . NSVT (nonsustained ventricular tachycardia) ([place])    a. Noted on ICD interrogation in 2011.  [name] PAD (peripheral artery disease) ([place])    a. 08/2013 Periph Angio/PTA: Abd Ao nl, RLE- 3v runoff, PT diff dzs, AT 90p, LLE 2v runoff, PT 100, AT 93m (diamondback ORA/chocolate balloon PTA).  [name] PAF (paroxysmal atrial fibrillation) ([place])    a. Noted on ICD interrogation 2012;  b. coumadin d/c'd 01/2013.  [name] PAF (paroxysmal atrial fibrillation) ([place])   . Peripheral neuropathy (HCC)    hands;numbness and tingling   . Pneumonia    hx of > 35yr ago  . PONV (postoperative nausea and vomiting)   . Type II diabetes mellitus ([place])    takes Levemir daily as well as Novolog  . Urinary frequency   . Urinary urgency     Past Surgical History:  Procedure Laterality Date  . ABDOMINAL ANGIOGRAM  09/12/2013   Procedure: ABDOMINAL ANGIOGRAM;  Surgeon: Lorretta Harp, MD;  Location: [facility] CATH LAB;  Service: Cardiovascular;;  . AMPUTATION Left 10/04/2013   Procedure: LEFT GREAT TOE  AND SECOND TOE AMPUTATION;  Surgeon: Marianna Payment, MD;  Location: [place];  Service: Orthopedics;  Laterality: Left;  . AMPUTATION Left 11/09/2013   Procedure: LEFT AMPUTATION BELOW KNEE;  Surgeon: Marianna Payment, MD;  Location: [place];  Service: Orthopedics;  Laterality: Left;  . AMPUTATION Right 01/04/2014   Procedure: Doristine Devoid second and third toe amputation;  Surgeon: Marianna Payment, MD;  Location: [place];  Service: Orthopedics;  Laterality: Right;  . AMPUTATION Right 01/30/2014   Procedure: RIGHT BELOW KNEE AMPUTATION;  Surgeon: Marianna Payment, MD;  Location: [place];  Service: Orthopedics;   Laterality: Right;  . ATHERECTOMY Right 12/29/2013   Procedure: ATHERECTOMY;  Surgeon: Lorretta Harp, MD;  Location: [facility] CATH LAB;  Service: Cardiovascular;  Laterality: Right;  Anterior Tibial Artery  . CARDIAC CATHETERIZATION  10/01/2001   THERE WAS GLOBAL HYPOKINESIS AND EF 25%. THERE APPEARED TO BE GLOBAL DECREASE IN WALL MOTION  . CARDIAC DEFIBRILLATOR PLACEMENT  06/2009   WITH GENERATOR REPLACED; BiV ICD  . CARDIOVASCULAR STRESS TEST  03/20/2009   EF 33%  . [place]  . COLONOSCOPY    . ESOPHAGOGASTRODUODENOSCOPY    . HIP ARTHROPLASTY Left 12/28/2015   Procedure: POSTERIOR  APPROACH HEMI HIP ARTHROPLASTY;  Surgeon: Leandrew Koyanagi, MD;  Location: [place];  Service: Orthopedics;  Laterality: Left;  . IR GENERIC HISTORICAL  10/18/2016   IR PERC CHOLECYSTOSTOMY 10/18/2016 [name], MD [facility]-INTERV RAD  . LEG AMPUTATION BELOW KNEE Left 11/09/2013   DR Erlinda Hong  . LITHOTRIPSY  2001  . LOWER EXTREMITY ANGIOGRAM Bilateral 09/12/2013   Procedure: LOWER EXTREMITY ANGIOGRAM;  Surgeon: Lorretta Harp, MD;  [facility] C[facility] LAB;  Service: Cardiovascular;  Laterality: Bilateral;  . LOWER EXTREMITY ANGIOGRAM N/A 12/29/2013   Procedure: LOWER EXTREMITY ANGIOGRAM;  Surgeon: Lorretta Harp, MD;  Location: [facility] CATH LAB;  Service: Cardiovascular;  Laterality: N/A;  . STUMP REVISION Left 01/03/2015   Procedure: LEFT BELOW KNEE AMPUTATION REVISION;  Surgeon: Leandrew Koyanagi, MD;  Location: [place];  Service: Orthopedics;  Laterality: Left;  . TOE AMPUTATION  10/04/2013   LEFT GREAT TOE AND 4TH TOE   /   DR Erlinda Hong  . TRANSLUMINAL ATHERECTOMY TIBIAL ARTERY Left 09/12/2013  . US ECHOCARDIOGRAPHY  03/21/2008   EF 30-35%    Allergies: Patient has no known allergies.  Medications: Prior to Admission medications   Medication Sig Start Date End Date Taking? Authorizing Provider  acetaminophen (TYLENOL) 325 MG tablet Take 2 tablets (650 mg total) by mouth every 6 (six) hours as needed for mild pain (or Fever  >/= 101). 05/03/16   Maryann Mikhail, DO  atorvastatin (LIPITOR) 10 MG tablet Take 1 tablet (10 mg total) by mouth daily. 01/15/15   Lavon Paganini Angiulli, PA-C  carvedilol (COREG) 6.25 MG tablet Take 1 tablet (6.25 mg total) by mouth 2 (two) times daily with a meal. 10/23/16   Velvet Bathe, MD  Cholecalciferol (VITAMIN D3) 10000 units TABS Take 1 tablet by mouth daily.    Historical Provider, MD  furosemide (LASIX) 40 MG tablet Take 1 tablet (40 mg total) by mouth daily. 01/02/16   Theodis Blaze, MD  insulin detemir (LEVEMIR) 100 UNIT/ML injection Inject 0.15 mLs (15 Units total) into the skin 2 (two) times daily. 10/23/16   Velvet Bathe, MD  iron polysaccharides (NIFEREX) 150 MG capsule Take 1 capsule (150 mg total) by mouth daily. 01/15/15   Lavon Paganini Angiulli, PA-C  Multiple Vitamin (  MULTIVITAMIN WITH MINERALS) TABS tablet Take 1 tablet by mouth daily.    Historical Provider, MD  omeprazole (PRILOSEC) 20 MG capsule Take 20 mg by mouth daily.    Historical Provider, MD  pregabalin (LYRICA) 75 MG capsule Take 1 capsule (75 mg total) by mouth 2 (two) times daily. 01/15/15   Daniel J Angiulli, PA-C  sacubitril-valsartan (ENTRESTO) 24-26 MG Take 1 tablet by mouth 2 (two) times daily. 10/31/15   Deboraha Sprang, MD  spironolactone (ALDACTONE) 25 MG tablet Take 1 tablet (25 mg total) by mouth daily. 11/10/16   Larey Dresser, MD  warfarin (COUMADIN) 10 MG tablet Take 1 tablet (10 mg total) by mouth daily. 10/23/16   Velvet Bathe, MD     Family History  Problem Relation Age of Onset  . Heart disease Mother   . Hypertension Mother   . Diabetes Mother   . Diabetes Father     Social History   Social History  . Marital status: Married    Spouse name: N/A  . Number of children: N/A  . Years of education: N/A   Social History Main Topics  . Smoking status: Never Smoker  . Smokeless tobacco: Never Used  . Alcohol use No  . Drug use: No  . Sexual activity: Not Currently   Other Topics Concern  . Not on file     Social History Narrative  . No narrative on file     Vital Signs: BP (!) 116/58   Pulse (!) 110   Temp 99.7 F (37.6 C)   SpO2 98%   Physical Exam  Constitutional: He is oriented to person, place, and time. He appears well-developed. No distress.  Cardiovascular: Regular rhythm and normal heart sounds.   Pulmonary/Chest: Effort normal and breath sounds normal. No respiratory distress.  Abdominal: Soft. There is tenderness (to palpation in the RUQ).  Neurological: He is alert and oriented to person, place, and time.  Psychiatric: He has a normal mood and affect. His behavior is normal. Judgment and thought content normal.  Nursing note and vitals reviewed.   Imaging: No results found.  Labs:  CBC:  Recent Labs  10/19/16 0451 10/20/16 0406 10/21/16 0453 10/22/16 0357  WBC 11.1* 8.9 9.1 9.9  HGB 13.1 12.2* 12.0* 11.0*  HCT 40.2 37.1* 36.5* 32.7*  PLT 132* 119* 154 162    COAGS:  Recent Labs  12/28/15 0550  10/21/16 0453 10/22/16 0357 10/23/16 0405 11/19/16  INR  --   < > 2.01 2.24 1.98 4.2*  APTT 47*  --   --   --   --   --   < > = values in this interval not displayed.  BMP:  Recent Labs  10/21/16 0453 10/22/16 0357 10/23/16 1008 11/10/16 1101  NA 147* 141 139 140  K 3.1* 3.7 3.8 4.3  CL 108 105 105 106  CO2 29 26 27 25   GLUCOSE 235* 248* 322* 220*  BUN 42* 42* 38* 15  CALCIUM 8.2* 7.8* 8.0* 8.1*  CREATININE 1.78* 1.70* 1.52* 1.11  GFRNONAA 36* 38* 44* >60  GFRAA 42* 44* 51* >60    LIVER FUNCTION TESTS:  Recent Labs  10/17/16 1409 10/19/16 0451 10/21/16 0453 10/22/16 0357  BILITOT 0.8 1.2 0.9 0.5  AST 21 120* 36 27  ALT 26 97* 59 39  ALKPHOS 52 80 93 90  PROT 6.2* 6.5 6.0* 5.9*  ALBUMIN 2.5* 2.3* 1.9* 1.9*    Assessment: Acute cholecystitis s/p percutaneous cholecystostomy placement 10/18/16.  Patient presents to clinic today for follow-up of his percutaneous cholecystostomy. He has continued to have thick, brown output from  his drain. Drain injection today shows ongoing gallbladder distention and obstruction of the common bile duct.  Patient has been evaluated by Cardiology and has plans for follow-up with surgery 11/25/16.  He states he has continued to feel weak and fatigued since discharge from SNF where he also had C. Diff.  He continues PO antibiotics and has home health assisting.  Encouraged wife to present for surgical follow-up next week.  Reviewed plans for drain to remain in place given ongoing obstruction.  Encouraged wife to call MD or report to the ED should patient develop fevers, chills, severe abdominal pain, or significant change in status.  Will plan for follow-up with drain injection in 6 weeks.  Schedulers to call family with date and time of appointment.   Signed: Docia Barrier 11/20/2016, 11:58 AM   Please refer to Dr. Barbie Banner attestation of this note for management and plan.

## 2016-11-26 ENCOUNTER — Ambulatory Visit (INDEPENDENT_AMBULATORY_CARE_PROVIDER_SITE_OTHER): Payer: Self-pay | Admitting: Pharmacist

## 2016-11-26 DIAGNOSIS — Z5181 Encounter for therapeutic drug level monitoring: Secondary | ICD-10-CM

## 2016-11-26 DIAGNOSIS — I4891 Unspecified atrial fibrillation: Secondary | ICD-10-CM

## 2016-11-26 LAB — PROTIME-INR: INR: 3.3 — AB (ref 0.9–1.1)

## 2016-12-08 ENCOUNTER — Inpatient Hospital Stay (HOSPITAL_COMMUNITY)
Admission: EM | Admit: 2016-12-08 | Discharge: 2016-12-16 | DRG: 436 | Disposition: A | Discharge status: Home Health | Payer: Medicare Other | Attending: Internal Medicine | Admitting: Internal Medicine

## 2016-12-08 ENCOUNTER — Emergency Department (HOSPITAL_COMMUNITY): Payer: Medicare Other

## 2016-12-08 ENCOUNTER — Encounter (HOSPITAL_COMMUNITY): Payer: Self-pay

## 2016-12-08 ENCOUNTER — Inpatient Hospital Stay (HOSPITAL_COMMUNITY): Payer: Medicare Other

## 2016-12-08 DIAGNOSIS — B9620 Unspecified Escherichia coli [E. coli] as the cause of diseases classified elsewhere: Secondary | ICD-10-CM | POA: Diagnosis present

## 2016-12-08 DIAGNOSIS — Z1612 Extended spectrum beta lactamase (ESBL) resistance: Secondary | ICD-10-CM | POA: Diagnosis present

## 2016-12-08 DIAGNOSIS — R6881 Early satiety: Secondary | ICD-10-CM

## 2016-12-08 DIAGNOSIS — R001 Bradycardia, unspecified: Secondary | ICD-10-CM | POA: Diagnosis not present

## 2016-12-08 DIAGNOSIS — I4891 Unspecified atrial fibrillation: Secondary | ICD-10-CM | POA: Diagnosis not present

## 2016-12-08 DIAGNOSIS — E1140 Type 2 diabetes mellitus with diabetic neuropathy, unspecified: Secondary | ICD-10-CM | POA: Diagnosis present

## 2016-12-08 DIAGNOSIS — Z7901 Long term (current) use of anticoagulants: Secondary | ICD-10-CM | POA: Diagnosis not present

## 2016-12-08 DIAGNOSIS — Z87442 Personal history of urinary calculi: Secondary | ICD-10-CM

## 2016-12-08 DIAGNOSIS — A0472 Enterocolitis due to Clostridium difficile, not specified as recurrent: Secondary | ICD-10-CM | POA: Diagnosis present

## 2016-12-08 DIAGNOSIS — Y92009 Unspecified place in unspecified non-institutional (private) residence as the place of occurrence of the external cause: Secondary | ICD-10-CM

## 2016-12-08 DIAGNOSIS — I447 Left bundle-branch block, unspecified: Secondary | ICD-10-CM | POA: Diagnosis present

## 2016-12-08 DIAGNOSIS — D638 Anemia in other chronic diseases classified elsewhere: Secondary | ICD-10-CM | POA: Diagnosis present

## 2016-12-08 DIAGNOSIS — Z96642 Presence of left artificial hip joint: Secondary | ICD-10-CM | POA: Diagnosis present

## 2016-12-08 DIAGNOSIS — C259 Malignant neoplasm of pancreas, unspecified: Secondary | ICD-10-CM | POA: Diagnosis present

## 2016-12-08 DIAGNOSIS — I428 Other cardiomyopathies: Secondary | ICD-10-CM | POA: Diagnosis present

## 2016-12-08 DIAGNOSIS — K819 Cholecystitis, unspecified: Secondary | ICD-10-CM | POA: Diagnosis present

## 2016-12-08 DIAGNOSIS — K8689 Other specified diseases of pancreas: Secondary | ICD-10-CM

## 2016-12-08 DIAGNOSIS — R160 Hepatomegaly, not elsewhere classified: Secondary | ICD-10-CM | POA: Diagnosis present

## 2016-12-08 DIAGNOSIS — N183 Chronic kidney disease, stage 3 unspecified: Secondary | ICD-10-CM | POA: Diagnosis present

## 2016-12-08 DIAGNOSIS — E785 Hyperlipidemia, unspecified: Secondary | ICD-10-CM | POA: Diagnosis present

## 2016-12-08 DIAGNOSIS — E1151 Type 2 diabetes mellitus with diabetic peripheral angiopathy without gangrene: Secondary | ICD-10-CM | POA: Diagnosis present

## 2016-12-08 DIAGNOSIS — Y731 Therapeutic (nonsurgical) and rehabilitative gastroenterology and urology devices associated with adverse incidents: Secondary | ICD-10-CM | POA: Diagnosis present

## 2016-12-08 DIAGNOSIS — C251 Malignant neoplasm of body of pancreas: Principal | ICD-10-CM | POA: Diagnosis present

## 2016-12-08 DIAGNOSIS — I130 Hypertensive heart and chronic kidney disease with heart failure and stage 1 through stage 4 chronic kidney disease, or unspecified chronic kidney disease: Secondary | ICD-10-CM | POA: Diagnosis present

## 2016-12-08 DIAGNOSIS — K219 Gastro-esophageal reflux disease without esophagitis: Secondary | ICD-10-CM | POA: Diagnosis present

## 2016-12-08 DIAGNOSIS — I2510 Atherosclerotic heart disease of native coronary artery without angina pectoris: Secondary | ICD-10-CM | POA: Diagnosis present

## 2016-12-08 DIAGNOSIS — T85695A Other mechanical complication of other nervous system device, implant or graft, initial encounter: Secondary | ICD-10-CM | POA: Diagnosis present

## 2016-12-08 DIAGNOSIS — Z89512 Acquired absence of left leg below knee: Secondary | ICD-10-CM | POA: Diagnosis not present

## 2016-12-08 DIAGNOSIS — I420 Dilated cardiomyopathy: Secondary | ICD-10-CM

## 2016-12-08 DIAGNOSIS — E7800 Pure hypercholesterolemia, unspecified: Secondary | ICD-10-CM | POA: Diagnosis present

## 2016-12-08 DIAGNOSIS — R110 Nausea: Secondary | ICD-10-CM

## 2016-12-08 DIAGNOSIS — T85518A Breakdown (mechanical) of other gastrointestinal prosthetic devices, implants and grafts, initial encounter: Secondary | ICD-10-CM

## 2016-12-08 DIAGNOSIS — E1122 Type 2 diabetes mellitus with diabetic chronic kidney disease: Secondary | ICD-10-CM | POA: Diagnosis present

## 2016-12-08 DIAGNOSIS — Z89511 Acquired absence of right leg below knee: Secondary | ICD-10-CM | POA: Diagnosis not present

## 2016-12-08 DIAGNOSIS — E118 Type 2 diabetes mellitus with unspecified complications: Secondary | ICD-10-CM | POA: Diagnosis present

## 2016-12-08 DIAGNOSIS — I10 Essential (primary) hypertension: Secondary | ICD-10-CM | POA: Diagnosis present

## 2016-12-08 DIAGNOSIS — Z95810 Presence of automatic (implantable) cardiac defibrillator: Secondary | ICD-10-CM | POA: Diagnosis not present

## 2016-12-08 DIAGNOSIS — C787 Secondary malignant neoplasm of liver and intrahepatic bile duct: Secondary | ICD-10-CM

## 2016-12-08 DIAGNOSIS — R627 Adult failure to thrive: Secondary | ICD-10-CM | POA: Diagnosis present

## 2016-12-08 DIAGNOSIS — I5022 Chronic systolic (congestive) heart failure: Secondary | ICD-10-CM | POA: Diagnosis present

## 2016-12-08 DIAGNOSIS — Z89519 Acquired absence of unspecified leg below knee: Secondary | ICD-10-CM | POA: Diagnosis not present

## 2016-12-08 DIAGNOSIS — Z8249 Family history of ischemic heart disease and other diseases of the circulatory system: Secondary | ICD-10-CM

## 2016-12-08 DIAGNOSIS — I480 Paroxysmal atrial fibrillation: Secondary | ICD-10-CM | POA: Diagnosis present

## 2016-12-08 DIAGNOSIS — IMO0002 Reserved for concepts with insufficient information to code with codable children: Secondary | ICD-10-CM

## 2016-12-08 DIAGNOSIS — R933 Abnormal findings on diagnostic imaging of other parts of digestive tract: Secondary | ICD-10-CM | POA: Diagnosis present

## 2016-12-08 DIAGNOSIS — Z833 Family history of diabetes mellitus: Secondary | ICD-10-CM

## 2016-12-08 DIAGNOSIS — K769 Liver disease, unspecified: Secondary | ICD-10-CM | POA: Diagnosis not present

## 2016-12-08 DIAGNOSIS — K869 Disease of pancreas, unspecified: Secondary | ICD-10-CM | POA: Diagnosis not present

## 2016-12-08 DIAGNOSIS — Z79899 Other long term (current) drug therapy: Secondary | ICD-10-CM

## 2016-12-08 DIAGNOSIS — I5042 Chronic combined systolic (congestive) and diastolic (congestive) heart failure: Secondary | ICD-10-CM | POA: Diagnosis present

## 2016-12-08 DIAGNOSIS — Z794 Long term (current) use of insulin: Secondary | ICD-10-CM

## 2016-12-08 HISTORY — DX: Presence of cardiac pacemaker: Z95.0

## 2016-12-08 HISTORY — PX: IR CATHETER TUBE CHANGE: IMG717

## 2016-12-08 LAB — COMPREHENSIVE METABOLIC PANEL
ALBUMIN: 2.9 g/dL — AB (ref 3.5–5.0)
ALT: 19 U/L (ref 17–63)
AST: 14 U/L — ABNORMAL LOW (ref 15–41)
Alkaline Phosphatase: 191 U/L — ABNORMAL HIGH (ref 38–126)
Anion gap: 12 (ref 5–15)
BILIRUBIN TOTAL: 1 mg/dL (ref 0.3–1.2)
BUN: 30 mg/dL — ABNORMAL HIGH (ref 6–20)
CALCIUM: 8.9 mg/dL (ref 8.9–10.3)
CO2: 22 mmol/L (ref 22–32)
CREATININE: 1.4 mg/dL — AB (ref 0.61–1.24)
Chloride: 106 mmol/L (ref 101–111)
GFR calc non Af Amer: 48 mL/min — ABNORMAL LOW (ref >60)
GFR, EST AFRICAN AMERICAN: 56 mL/min — AB (ref >60)
GLUCOSE: 316 mg/dL — AB (ref 65–99)
Potassium: 4.1 mmol/L (ref 3.5–5.1)
SODIUM: 140 mmol/L (ref 135–145)
TOTAL PROTEIN: 7.6 g/dL (ref 6.5–8.1)

## 2016-12-08 LAB — CBC WITH DIFFERENTIAL/PLATELET
BASOS PCT: 0 %
Basophils Absolute: 0 10*3/uL (ref 0.0–0.1)
EOS ABS: 0.1 10*3/uL (ref 0.0–0.7)
Eosinophils Relative: 1 %
HCT: 33.9 % — ABNORMAL LOW (ref 39.0–52.0)
Hemoglobin: 11.2 g/dL — ABNORMAL LOW (ref 13.0–17.0)
Lymphocytes Relative: 7 %
Lymphs Abs: 0.6 10*3/uL — ABNORMAL LOW (ref 0.7–4.0)
MCH: 23.9 pg — ABNORMAL LOW (ref 26.0–34.0)
MCHC: 33 g/dL (ref 30.0–36.0)
MCV: 72.4 fL — ABNORMAL LOW (ref 78.0–100.0)
MONOS PCT: 9 %
Monocytes Absolute: 0.8 10*3/uL (ref 0.1–1.0)
Neutro Abs: 7.3 10*3/uL (ref 1.7–7.7)
Neutrophils Relative %: 83 %
Platelets: 250 10*3/uL (ref 150–400)
RBC: 4.68 MIL/uL (ref 4.22–5.81)
RDW: 18.4 % — AB (ref 11.5–15.5)
WBC: 8.8 10*3/uL (ref 4.0–10.5)

## 2016-12-08 LAB — URINALYSIS, ROUTINE W REFLEX MICROSCOPIC
Bilirubin Urine: NEGATIVE
GLUCOSE, UA: 150 mg/dL — AB
Ketones, ur: 5 mg/dL — AB
NITRITE: NEGATIVE
PH: 5 (ref 5.0–8.0)
Protein, ur: 100 mg/dL — AB
SPECIFIC GRAVITY, URINE: 1.015 (ref 1.005–1.030)
Squamous Epithelial / LPF: NONE SEEN

## 2016-12-08 LAB — LIPASE, BLOOD: LIPASE: 53 U/L — AB (ref 11–51)

## 2016-12-08 LAB — POC OCCULT BLOOD, ED: Fecal Occult Bld: POSITIVE — AB

## 2016-12-08 LAB — I-STAT CG4 LACTIC ACID, ED: Lactic Acid, Venous: 1.28 mmol/L (ref 0.5–1.9)

## 2016-12-08 LAB — PROTIME-INR
INR: 1.84
Prothrombin Time: 21.5 seconds — ABNORMAL HIGH (ref 11.4–15.2)

## 2016-12-08 LAB — GLUCOSE, CAPILLARY: GLUCOSE-CAPILLARY: 341 mg/dL — AB (ref 65–99)

## 2016-12-08 MED ORDER — PIPERACILLIN-TAZOBACTAM 3.375 G IVPB 30 MIN
3.3750 g | INTRAVENOUS | Status: AC
  Administered 2016-12-08: 3.375 g via INTRAVENOUS
  Filled 2016-12-08: qty 50

## 2016-12-08 MED ORDER — IOPAMIDOL (ISOVUE-300) INJECTION 61%
INTRAVENOUS | Status: DC | PRN
  Administered 2016-12-08: 5 mL

## 2016-12-08 MED ORDER — SODIUM CHLORIDE 0.9 % IV BOLUS (SEPSIS)
500.0000 mL | Freq: Once | INTRAVENOUS | Status: AC
  Administered 2016-12-08: 500 mL via INTRAVENOUS

## 2016-12-08 MED ORDER — SODIUM CHLORIDE 0.9 % IV SOLN
INTRAVENOUS | Status: DC
Start: 2016-12-08 — End: 2016-12-09
  Administered 2016-12-08 – 2016-12-09 (×2): via INTRAVENOUS

## 2016-12-08 MED ORDER — INSULIN ASPART 100 UNIT/ML ~~LOC~~ SOLN
0.0000 [IU] | Freq: Three times a day (TID) | SUBCUTANEOUS | Status: DC
  Administered 2016-12-09: 9 [IU] via SUBCUTANEOUS
  Administered 2016-12-09: 3 [IU] via SUBCUTANEOUS
  Administered 2016-12-09: 5 [IU] via SUBCUTANEOUS
  Administered 2016-12-10: 2 [IU] via SUBCUTANEOUS
  Administered 2016-12-10: 3 [IU] via SUBCUTANEOUS
  Administered 2016-12-10 – 2016-12-11 (×2): 5 [IU] via SUBCUTANEOUS
  Administered 2016-12-11: 2 [IU] via SUBCUTANEOUS
  Administered 2016-12-11: 7 [IU] via SUBCUTANEOUS
  Administered 2016-12-12: 1 [IU] via SUBCUTANEOUS
  Administered 2016-12-12 (×2): 3 [IU] via SUBCUTANEOUS
  Administered 2016-12-13: 1 [IU] via SUBCUTANEOUS
  Administered 2016-12-13: 3 [IU] via SUBCUTANEOUS
  Administered 2016-12-13: 5 [IU] via SUBCUTANEOUS
  Administered 2016-12-14: 2 [IU] via SUBCUTANEOUS
  Administered 2016-12-14: 5 [IU] via SUBCUTANEOUS
  Administered 2016-12-14: 2 [IU] via SUBCUTANEOUS
  Administered 2016-12-15: 5 [IU] via SUBCUTANEOUS
  Administered 2016-12-15: 3 [IU] via SUBCUTANEOUS
  Administered 2016-12-16: 5 [IU] via SUBCUTANEOUS
  Administered 2016-12-16: 3 [IU] via SUBCUTANEOUS

## 2016-12-08 MED ORDER — PIPERACILLIN-TAZOBACTAM 3.375 G IVPB
3.3750 g | Freq: Three times a day (TID) | INTRAVENOUS | Status: DC
  Administered 2016-12-09 – 2016-12-12 (×11): 3.375 g via INTRAVENOUS
  Filled 2016-12-08 (×12): qty 50

## 2016-12-08 MED ORDER — ALUM & MAG HYDROXIDE-SIMETH 200-200-20 MG/5ML PO SUSP
15.0000 mL | ORAL | Status: DC | PRN
  Administered 2016-12-09: 15 mL via ORAL
  Filled 2016-12-08: qty 30

## 2016-12-08 MED ORDER — ACETAMINOPHEN 325 MG PO TABS
650.0000 mg | ORAL_TABLET | Freq: Four times a day (QID) | ORAL | Status: DC | PRN
  Administered 2016-12-09 – 2016-12-15 (×4): 650 mg via ORAL
  Filled 2016-12-08 (×5): qty 2

## 2016-12-08 MED ORDER — ACETAMINOPHEN 650 MG RE SUPP: 650.0000 mg | Freq: Four times a day (QID) | RECTAL | Status: DC | PRN

## 2016-12-08 MED ORDER — IOPAMIDOL (ISOVUE-300) INJECTION 61%
100.0000 mL | Freq: Once | INTRAVENOUS | Status: AC | PRN
  Administered 2016-12-08: 100 mL via INTRAVENOUS

## 2016-12-08 MED ORDER — SODIUM CHLORIDE 0.9 % IV SOLN
8.0000 mg/h | INTRAVENOUS | Status: DC
  Administered 2016-12-08: 8 mg/h via INTRAVENOUS
  Filled 2016-12-08: qty 80

## 2016-12-08 MED ORDER — SACUBITRIL-VALSARTAN 24-26 MG PO TABS
1.0000 | ORAL_TABLET | Freq: Two times a day (BID) | ORAL | Status: DC
  Administered 2016-12-09 – 2016-12-16 (×15): 1 via ORAL
  Filled 2016-12-08 (×16): qty 1

## 2016-12-08 MED ORDER — LIDOCAINE HCL 1 % IJ SOLN
INTRAMUSCULAR | Status: DC | PRN
  Administered 2016-12-08: 5 mL via INTRADERMAL

## 2016-12-08 MED ORDER — ALBUTEROL SULFATE (2.5 MG/3ML) 0.083% IN NEBU: 2.5000 mg | INHALATION_SOLUTION | RESPIRATORY_TRACT | Status: DC | PRN

## 2016-12-08 MED ORDER — IOPAMIDOL (ISOVUE-300) INJECTION 61%
INTRAVENOUS | Status: AC
  Filled 2016-12-08: qty 50

## 2016-12-08 MED ORDER — HYDROCODONE-ACETAMINOPHEN 5-325 MG PO TABS
1.0000 | ORAL_TABLET | ORAL | Status: DC | PRN
  Administered 2016-12-13 – 2016-12-14 (×2): 1 via ORAL
  Administered 2016-12-14: 2 via ORAL
  Filled 2016-12-08 (×2): qty 1
  Filled 2016-12-08: qty 2

## 2016-12-08 MED ORDER — LIDOCAINE HCL 1 % IJ SOLN
INTRAMUSCULAR | Status: AC
  Filled 2016-12-08: qty 20

## 2016-12-08 MED ORDER — PANTOPRAZOLE SODIUM 40 MG IV SOLR
40.0000 mg | Freq: Once | INTRAVENOUS | Status: AC
  Administered 2016-12-08: 40 mg via INTRAVENOUS
  Filled 2016-12-08: qty 40

## 2016-12-08 MED ORDER — SPIRONOLACTONE 25 MG PO TABS
25.0000 mg | ORAL_TABLET | Freq: Every day | ORAL | Status: DC
  Administered 2016-12-09 – 2016-12-16 (×7): 25 mg via ORAL
  Filled 2016-12-08 (×11): qty 1

## 2016-12-08 MED ORDER — IOPAMIDOL (ISOVUE-300) INJECTION 61%
INTRAVENOUS | Status: AC
  Filled 2016-12-08: qty 100

## 2016-12-08 MED ORDER — INSULIN DETEMIR 100 UNIT/ML ~~LOC~~ SOLN
10.0000 [IU] | Freq: Two times a day (BID) | SUBCUTANEOUS | Status: DC
  Administered 2016-12-09: 10 [IU] via SUBCUTANEOUS
  Filled 2016-12-08 (×2): qty 0.1

## 2016-12-08 MED ORDER — INSULIN ASPART 100 UNIT/ML ~~LOC~~ SOLN
0.0000 [IU] | Freq: Every day | SUBCUTANEOUS | Status: DC
  Administered 2016-12-08: 4 [IU] via SUBCUTANEOUS
  Administered 2016-12-10: 2 [IU] via SUBCUTANEOUS
  Administered 2016-12-12: 3 [IU] via SUBCUTANEOUS
  Administered 2016-12-13: 2 [IU] via SUBCUTANEOUS
  Administered 2016-12-14: 3 [IU] via SUBCUTANEOUS
  Administered 2016-12-15: 4 [IU] via SUBCUTANEOUS

## 2016-12-08 MED ORDER — PANTOPRAZOLE SODIUM 40 MG PO TBEC
40.0000 mg | DELAYED_RELEASE_TABLET | Freq: Two times a day (BID) | ORAL | Status: DC
  Administered 2016-12-09 – 2016-12-16 (×15): 40 mg via ORAL
  Filled 2016-12-08 (×16): qty 1

## 2016-12-08 NOTE — ED Triage Notes (Signed)
PT C/O GENERALIZED WEAKNESS THAT HAS GOTTEN WORSE SINCE Friday, PER THE WIFE. SHE ALSO STS THAT HE HAS HAD A GALLBLADDER STENT X1 MONTH, AND YESTERDAY THE STENT BROKE AND HAS BEEN LEAKING. SHE ALSO STS FEVER OF 102 ON Saturday.

## 2016-12-08 NOTE — ED Notes (Signed)
Patient has been transported to IR and will have procedure. Afterwards, Robert, NT will transport to assigned room. Informed Daneil Dan, RN about delay.

## 2016-12-08 NOTE — ED Provider Notes (Signed)
[facility] DEPT Provider Note   CSN: [id number] Arrival date & time: 12/08/16  1118     History   Chief Complaint Chief Complaint  Patient presents with  . Weakness  . Drainage from Incision    GALLBLADDER STENT    HPI [name] [name] is a 71 y.o. male.With a past medical history of nonischemic cardiomyopathy, vascular disease, anemia, paroxysmal A. fib. The patient is chronically anticoagulated on Coumadin. He is also on daily. Iron. The patient is brought in by his wife and daughter for fatigue and abdominal pain. According to the patient's wife, he has had decreased appetite, fatigue, nausea, retching, and black, runny stools. The patient also has a percutaneous drain in his gallbladder. Yesterday the bag fell and part of the tubing broke off. However, he has had good output from the drain site, and he states that his abdominal pain is periumbilical. The patient has had severe fatigue and ran a fever of 102 Fahrenheit at home 2 days ago. He denies chest pain, shortness of breath. His wife states that he is essentially "bedbound" throughout the weekend. He has had black diarrhea. He complains of significant pain. He has had soaking night sweats and early satiety and frank anorexia over the past 2 days.  HPI  Past Medical History:  Diagnosis Date  . Anemia    takes Iron pill daily  . Arthritis   . Automatic implantable cardioverter-defibrillator in situ    a. 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  . CAD (coronary artery disease)   . Cardiomyopathy, nonischemic ([place])    a. Cath 2003: mild nonobstructive CAD, EF 25% at that time.  . Chronic systolic CHF (congestive heart failure) (HCC)    takes Furosemide daily as well as Aldactone  . CKD (chronic kidney disease), stage III   . Complication of anesthesia   . Constipation    takes Sennokot daily  . Dementia   . GERD (gastroesophageal reflux disease)    takes Nexium daily  . Headache    occasionally  . High cholesterol    takes Atorvastatin daily  . History of blood transfusion    no abnormal reaction noted  . History of bronchitis    > 1 yr ago  . History of kidney stones   . Hypertension    takes Coreg daily  . LBBB (left bundle branch block)    takes Coumadin daily  . NSVT (nonsustained ventricular tachycardia) ([place])    a. Noted on ICD interrogation in 2011.  [name] PAD (peripheral artery disease) ([place])    a. 08/2013 Periph Angio/PTA: Abd Ao nl, RLE- 3v runoff, PT diff dzs, AT 90p, LLE 2v runoff, PT 100, AT 64m (diamondback ORA/chocolate balloon PTA).  [name] PAF (paroxysmal atrial fibrillation) ([place])    a. Noted on ICD interrogation 2012;  b. coumadin d/c'd 01/2013.  [name] PAF (paroxysmal atrial fibrillation) ([place])   . Peripheral neuropathy    hands;numbness and tingling   . Pneumonia    hx of > 30yr ago  . PONV (postoperative nausea and vomiting)   . Type II diabetes mellitus ([place])    takes Levemir daily as well as Novolog  . Urinary frequency   . Urinary urgency     Patient Active Problem List   Diagnosis Date Noted  . Abdominal pain   . Cholecystitis   . NICM (nonischemic cardiomyopathy) ([place])   . Stage 3 chronic kidney disease   . Hypernatremia   . Systolic CHF, acute on  chronic ([place]) 10/13/2016  . Diarrhea 10/13/2016  . Acute on chronic systolic congestive heart failure ([place])   . Diabetes mellitus with complication ([place])   . HCAP (healthcare-associated pneumonia) 04/30/2016  . Weakness generalized 04/30/2016  . Status post hip hemiarthroplasty   . Urinary retention   . Dementia   . AKI (acute kidney injury) ([place])   . Thrombocytopenia ([place])   . Anemia of chronic disease   . Closed left hip fracture ([place]) 12/28/2015  . Acute renal failure superimposed on stage 3 chronic kidney disease ([place]) 12/28/2015  . Fall   . Diabetes mellitus with neuropathy ([place]) 01/10/2015  . Amputation of right lower extremity below knee ([place]) 01/08/2015  . Below knee amputation  status ([place]) 01/03/2015  . Atrial fibrillation [I48.91] 11/01/2014  . Encounter for therapeutic drug monitoring 11/01/2014  . S/P bilateral BKA (below knee amputation) ([place]) 02/02/2014  . Osteomyelitis of right foot ([place]) 01/30/2014  . 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 ([place]) 01/04/2014  . Osteomyelitis of toe of right foot ([place]) 01/04/2014  . S/P Lt BKA 11/09/13 11/14/2013  . Acute blood loss anemia 11/11/2013  . Type II or unspecified type diabetes mellitus 11/09/2013  . Lower limb amputation, great toe ([place]) 10/11/2013  . Lower limb amputation, other toe(s) 10/11/2013  . Chronic osteomyelitis of toe of left foot ([place]) 10/04/2013  . Foot osteomyelitis, left ([place]) 10/04/2013  . Uncontrolled pain, Lt toe 09/21/2013  . Gangrenous toe, Lt toe 09/21/2013  . PAD (peripheral artery disease) ([place]) 09/13/2013  . Diabetes mellitus ([place]) 09/13/2013  . Hyperlipidemia 09/13/2013  . PAF (paroxysmal atrial fibrillation) ([place]) 09/13/2013  . Critical lower limb ischemia- s/p Rt anterior tibial PTA 12/29/13 in preparation for Rt BKA 08/30/2013  . Generalized weakness 02/12/2013  . 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 HTN (hypertension) 04/08/2011  . NICM- EF 20-25% echo 6/14 09/29/2008  . LBBB 09/29/2008  . SYSTOLIC HEART FAILURE, CHRONIC 09/29/2008    Past Surgical History:  Procedure Laterality Date  . ABDOMINAL ANGIOGRAM  09/12/2013   Procedure: ABDOMINAL ANGIOGRAM;  Surgeon: Lorretta Harp, MD;  Location: [facility] CATH LAB;  Service: Cardiovascular;;  . AMPUTATION Left 10/04/2013   Procedure: LEFT GREAT TOE AND SECOND TOE AMPUTATION;  Surgeon: Marianna Payment, MD;  Location: [facility];  Service: Orthopedics;  Laterality: Left;  . AMPUTATION Left 11/09/2013   Procedure: LEFT AMPUTATION BELOW KNEE;  Surgeon: Marianna Payment, MD;  Location: [facility];  Service: Orthopedics;  Laterality: Left;  . AMPUTATION Right 01/04/2014   Procedure: Doristine Devoid second and third toe amputation;   Surgeon: Marianna Payment, MD;  Location: [facility];  Service: Orthopedics;  Laterality: Right;  . AMPUTATION Right 01/30/2014   Procedure: RIGHT BELOW KNEE AMPUTATION;  Surgeon: Marianna Payment, MD;  Location: [facility];  Service: Orthopedics;  Laterality: Right;  . ATHERECTOMY Right 12/29/2013   Procedure: ATHERECTOMY;  Surgeon: Lorretta Harp, MD;  Location: [facility] CATH LAB;  Service: Cardiovascular;  Laterality: Right;  Anterior Tibial Artery  . CARDIAC CATHETERIZATION  10/01/2001   THERE WAS GLOBAL HYPOKINESIS AND EF 25%. THERE APPEARED TO BE GLOBAL DECREASE IN WALL MOTION  . CARDIAC DEFIBRILLATOR PLACEMENT  06/2009   WITH GENERATOR REPLACED; BiV ICD  . CARDIOVASCULAR STRESS TEST  03/20/2009   EF 33%  . [place]  . COLONOSCOPY    . ESOPHAGOGASTRODUODENOSCOPY    . HIP ARTHROPLASTY Left 12/28/2015   Procedure: POSTERIOR  APPROACH  HEMI HIP ARTHROPLASTY;  Surgeon: Leandrew Koyanagi, MD;  Location: [facility];  Service: Orthopedics;  Laterality: Left;  . IR GENERIC HISTORICAL  10/18/2016   IR PERC CHOLECYSTOSTOMY 10/18/2016 [name], MD [facility]-INTERV RAD  . LEG AMPUTATION BELOW KNEE Left 11/09/2013   DR Erlinda Hong  . LITHOTRIPSY  2001  . LOWER EXTREMITY ANGIOGRAM Bilateral 09/12/2013   Procedure: LOWER EXTREMITY ANGIOGRAM;  Surgeon: Lorretta Harp, MD;  Location: [facility] CATH LAB;  Service: Cardiovascular;  Laterality: Bilateral;  . LOWER EXTREMITY ANGIOGRAM N/A 12/29/2013   Procedure: LOWER EXTREMITY ANGIOGRAM;  Surgeon: Lorretta Harp, MD;  Location: [facility] CATH LAB;  Service: Cardiovascular;  Laterality: N/A;  . STUMP REVISION Left 01/03/2015   Procedure: LEFT BELOW KNEE AMPUTATION REVISION;  Surgeon: Leandrew Koyanagi, MD;  Location: [facility];  Service: Orthopedics;  Laterality: Left;  . TOE AMPUTATION  10/04/2013   LEFT GREAT TOE AND 4TH TOE   /   DR Erlinda Hong  . TRANSLUMINAL ATHERECTOMY TIBIAL ARTERY Left 09/12/2013  . US ECHOCARDIOGRAPHY  03/21/2008   EF 30-35%       Home Medications    Prior to Admission  medications   Medication Sig Start Date End Date Taking? Authorizing Provider  acetaminophen (TYLENOL) 325 MG tablet Take 2 tablets (650 mg total) by mouth every 6 (six) hours as needed for mild pain (or Fever >/= 101). 05/03/16   Maryann Mikhail, DO  atorvastatin (LIPITOR) 10 MG tablet Take 1 tablet (10 mg total) by mouth daily. 01/15/15   Lavon Paganini Angiulli, PA-C  carvedilol (COREG) 6.25 MG tablet Take 1 tablet (6.25 mg total) by mouth 2 (two) times daily with a meal. 10/23/16   Velvet Bathe, MD  Cholecalciferol (VITAMIN D3) 10000 units TABS Take 1 tablet by mouth daily.    Historical Provider, MD  furosemide (LASIX) 40 MG tablet Take 1 tablet (40 mg total) by mouth daily. 01/02/16   Theodis Blaze, MD  insulin detemir (LEVEMIR) 100 UNIT/ML injection Inject 0.15 mLs (15 Units total) into the skin 2 (two) times daily. 10/23/16   Velvet Bathe, MD  iron polysaccharides (NIFEREX) 150 MG capsule Take 1 capsule (150 mg total) by mouth daily. 01/15/15   Lavon Paganini Angiulli, PA-C  Multiple Vitamin (MULTIVITAMIN WITH MINERALS) TABS tablet Take 1 tablet by mouth daily.    Historical Provider, MD  omeprazole (PRILOSEC) 20 MG capsule Take 20 mg by mouth daily.    Historical Provider, MD  pregabalin (LYRICA) 75 MG capsule Take 1 capsule (75 mg total) by mouth 2 (two) times daily. 01/15/15   Daniel J Angiulli, PA-C  sacubitril-valsartan (ENTRESTO) 24-26 MG Take 1 tablet by mouth 2 (two) times daily. 10/31/15   Deboraha Sprang, MD  spironolactone (ALDACTONE) 25 MG tablet Take 1 tablet (25 mg total) by mouth daily. 11/10/16   Larey Dresser, MD  warfarin (COUMADIN) 10 MG tablet Take 1 tablet (10 mg total) by mouth daily. 10/23/16   Velvet Bathe, MD    Family History Family History  Problem Relation Age of Onset  . Heart disease Mother   . Hypertension Mother   . Diabetes Mother   . Diabetes Father     Social History Social History  Substance Use Topics  . Smoking status: Never Smoker  . Smokeless tobacco: Never Used    . Alcohol use No     Allergies   Patient has no known allergies.   Review of Systems Review of Systems Ten systems reviewed and are negative for  acute change, except as noted in the HPI.    Physical Exam Updated Vital Signs BP (!) 131/54 (BP Location: Right Arm)   Pulse (!) 50   Temp 97.8 F (36.6 C) (Oral)   Resp 15   Ht 5\' 9"  (1.753 m)   Wt 95.3 kg   SpO2 97%   BMI 31.01 kg/m   Physical Exam  Constitutional: He appears well-developed and well-nourished. No distress.  HENT:  Head: Normocephalic and atraumatic.  Eyes: Conjunctivae are normal. No scleral icterus.  Neck: Normal range of motion. Neck supple.  Cardiovascular: Normal rate, regular rhythm and normal heart sounds.   Pulmonary/Chest: Effort normal and breath sounds normal. No respiratory distress.  Abdominal: Soft. Bowel sounds are normal. He exhibits no distension. There is tenderness.    periumbilical  Genitourinary: Penile erythema:  thin and black.  Genitourinary Comments: Digital Rectal Exam reveals sphincter with good tone. No external hemorrhoids. No masses or fissures. Stool color  brown with no overt blood.   Musculoskeletal: He exhibits no edema.  Neurological: He is alert.  Skin: Skin is warm and dry. He is not diaphoretic.  Psychiatric: His behavior is normal.  Nursing note and vitals reviewed.    ED Treatments / Results  Labs (all labs ordered are listed, but only abnormal results are displayed) Labs Reviewed  CBC WITH DIFFERENTIAL/PLATELET - Abnormal; Notable for the following:       Result Value   Hemoglobin 11.2 (*)    HCT 33.9 (*)    MCV 72.4 (*)    MCH 23.9 (*)    RDW 18.4 (*)    Lymphs Abs 0.6 (*)    All other components within normal limits  COMPREHENSIVE METABOLIC PANEL  URINALYSIS, ROUTINE W REFLEX MICROSCOPIC  LIPASE, BLOOD  PROTIME-INR  I-STAT CG4 LACTIC ACID, ED  POC OCCULT BLOOD, ED    EKG  EKG Interpretation None       Radiology Dg Chest 2  View  Result Date: 12/08/2016 CLINICAL DATA:  Increasing generalized weakness since Friday, per wife pt also has gb stent that broke and is draining everywhere, no other chest complaints EXAM: CHEST  2 VIEW COMPARISON:  10/17/2016 FINDINGS: There is a linear band of airspace disease in the right middle lobe likely reflecting atelectasis. There is no pleural effusion or pneumothorax. The heart and mediastinal contours are unremarkable. There is a 3 lead cardiac pacemaker. The osseous structures are unremarkable. IMPRESSION: No active cardiopulmonary disease. Electronically Signed   By: Kathreen Devoid   On: 12/08/2016 11:57    Procedures Procedures (including critical care time)  Medications Ordered in ED Medications - No data to display   Initial Impression / Assessment and Plan / ED Course  I have reviewed the triage vital signs and the nursing notes.  Pertinent labs & imaging results that were available during my care of the patient were reviewed by me and considered in my medical decision making (see chart for details).  Clinical Course as of Dec 11 1239  Mon Dec 08, 2016  1305 Patient stool is positive for blood. I begin the patient on IV Protonix Fecal Occult Blood, POC: (!) POSITIVE [AH]  1305 Prothrombin Time: (!) 21.5 [AH]  1305 Patient's INR is subtherapeutic INR: 1.84 [AH]  1621 I was contacted by Dr. Henderson [place] of radiology. Patient appears to have a tumor in the pancreas which may be the underlying cause of the majority of his symptoms. Patient also has inflammatory changes surrounding the pancreas and the  splenic flexure of the colon. He also appears to have inflammatory stranding in the rectum which was seen on prior CT scans. On my examination. He has no tenderness to palpation of the rectum and I doubt anicteric rectal abscess. Given the fact that there is a lesion in the patient's liver that is suspicious for potential metastatic carcinoma. I have higher concern for carcinoma of the  colon and rectum. Patient will need admission. He has seen Dr. Oletta Lamas of [facility] previously (2014).   [AH]  1628 Hemoglobin appears to be at baseline Hemoglobin: (!) 11.2 [AH]    Clinical Course User Index [AH] Margarita Mail, PA-C    Patient with what appears to be a likely pancreatic cancer + FOBT. Patient will be admitted by the hospitalist. I have informed the family of the patients findings.   Final Clinical Impressions(s) / ED Diagnoses   Final diagnoses:  Pancreatic mass  Nausea  Early satiety    New Prescriptions New Prescriptions   No medications on file     Margarita Mail, PA-C 12/10/16 1246    M[place] [facility], MD 12/16/16 2144

## 2016-12-08 NOTE — Procedures (Signed)
Cholecystostomy tube was broken and leaking.  New 10 French drain placed.  Cloudy bilious material removed from gallbladder, ? Infection.  Will send fluid for culture.  New drain attached to gravity bag.

## 2016-12-08 NOTE — ED Notes (Signed)
Attempted to call CT x 2 that patient is ready for scan and has used the bedpan. No answer.

## 2016-12-08 NOTE — ED Notes (Signed)
MADE FIRST REQUEST FOR A URINE SAMPLE,PATIENT UNABLE TO PROVIDE ONE AT THIS TIME

## 2016-12-08 NOTE — H&P (Signed)
TRH H&P   Patient Demographics:    [name] [name], is a 72 y.o. male  MRN: [id number]   DOB - September 17, 1945  Admit Date - 12/08/2016  Outpatient Primary MD for the patient is [name], MD  Referring MD/NP/PA: PA Harris  Outpatient Specialists: Sx Dr Soyla Dryer Howie Ill  Patient coming from: Home  Chief Complaint  Patient presents with  . Weakness  . Drainage from Incision    GALLBLADDER STENT      HPI:    [name] [name]  is a 71 y.o. male, with medical history significant of NICM status post ICD, CHF with EF to 20-25% in 2014, DM-2, CKD, PAF on coumadin, bilateral BKA , with recent hospitalization secondary to cholecystitis, where patient discharge with cholecystostomy tube, patient presents with progressive weakness, failure to thrive, no appetite weight loss, intermittent fever as well secondary to malfunction and cholecystomy drain as bag has been disconnected, patient is a febrile NAD, with no leukocytosis, but CT abdomen and pelvis showing evidence of pancreatic mass or pancreatic ductal obstruction,, with possible liver metastases, and abnormal finding of perirectal area suspicious for abscess, patient denies any chest pain, shortness of breath, dysuria, polyuria, cough or productive sputum, wife reports he is with progressive weakness, sickly looking on the provided entire weekend, with no appetite and oral intake, I was called to admit.    Review of systems:    In addition to the HPI above, Wife reports fever and chills at home No Headache, No changes with Vision or hearing, No problems swallowing food or Liquids, No Chest pain, Cough or Shortness of Breath, Wertz abdominal pain, poor appetite, mild nausea, but no vomiting, bowel movements are regular . No Blood in stool or Urine, No dysuria, No new skin rashes or bruises, No new joints pains-aches,  No  new focal weakness, tingling, numbness in any extremity, but she has generalized weakness No recent weight gain or loss, No polyuria, polydypsia or polyphagia, No significant Mental Stressors.  A full 10 point Review of Systems was done, except as stated above, all other Review of Systems were negative.   With Past History of the following :    Past Medical History:  Diagnosis Date  . Anemia    takes Iron pill daily  . Arthritis   . Automatic implantable cardioverter-defibrillator in situ    a. 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  . CAD (coronary artery disease)   . Cardiomyopathy, nonischemic ([place])    a. Cath 2003: mild nonobstructive CAD, EF 25% at that time.  . Chronic systolic CHF (congestive heart failure) (HCC)    takes Furosemide daily as well as Aldactone  . CKD (chronic kidney disease), stage III   . Complication of anesthesia   . Constipation    takes Sennokot daily  . Dementia   . GERD (gastroesophageal reflux disease)    takes  Nexium daily  . Headache    occasionally  . High cholesterol    takes Atorvastatin daily  . History of blood transfusion    no abnormal reaction noted  . History of bronchitis    > 1 yr ago  . History of kidney stones   . Hypertension    takes Coreg daily  . LBBB (left bundle branch block)    takes Coumadin daily  . NSVT (nonsustained ventricular tachycardia) ([place])    a. Noted on ICD interrogation in 2011.  [name] PAD (peripheral artery disease) ([place])    a. 08/2013 Periph Angio/PTA: Abd Ao nl, RLE- 3v runoff, PT diff dzs, AT 90p, LLE 2v runoff, PT 100, AT 59m (diamondback ORA/chocolate balloon PTA).  [name] PAF (paroxysmal atrial fibrillation) ([place])    a. Noted on ICD interrogation 2012;  b. coumadin d/c'd 01/2013.  [name] PAF (paroxysmal atrial fibrillation) ([place])   . Peripheral neuropathy    hands;numbness and tingling   . Pneumonia    hx of > 18yr ago  . PONV (postoperative nausea and vomiting)   . Type II  diabetes mellitus ([place])    takes Levemir daily as well as Novolog  . Urinary frequency   . Urinary urgency       Past Surgical History:  Procedure Laterality Date  . ABDOMINAL ANGIOGRAM  09/12/2013   Procedure: ABDOMINAL ANGIOGRAM;  Surgeon: Lorretta Harp, MD;  Location: [facility] CATH LAB;  Service: Cardiovascular;;  . AMPUTATION Left 10/04/2013   Procedure: LEFT GREAT TOE AND SECOND TOE AMPUTATION;  Surgeon: Marianna Payment, MD;  Location: [place];  Service: Orthopedics;  Laterality: Left;  . AMPUTATION Left 11/09/2013   Procedure: LEFT AMPUTATION BELOW KNEE;  Surgeon: Marianna Payment, MD;  Location: [place];  Service: Orthopedics;  Laterality: Left;  . AMPUTATION Right 01/04/2014   Procedure: Doristine Devoid second and third toe amputation;  Surgeon: Marianna Payment, MD;  Location: [place];  Service: Orthopedics;  Laterality: Right;  . AMPUTATION Right 01/30/2014   Procedure: RIGHT BELOW KNEE AMPUTATION;  Surgeon: Marianna Payment, MD;  Location: [place];  Service: Orthopedics;  Laterality: Right;  . ATHERECTOMY Right 12/29/2013   Procedure: ATHERECTOMY;  Surgeon: Lorretta Harp, MD;  Location: [facility] CATH LAB;  Service: Cardiovascular;  Laterality: Right;  Anterior Tibial Artery  . CARDIAC CATHETERIZATION  10/01/2001   THERE WAS GLOBAL HYPOKINESIS AND EF 25%. THERE APPEARED TO BE GLOBAL DECREASE IN WALL MOTION  . CARDIAC DEFIBRILLATOR PLACEMENT  06/2009   WITH GENERATOR REPLACED; BiV ICD  . CARDIOVASCULAR STRESS TEST  03/20/2009   EF 33%  . [place]  . COLONOSCOPY    . ESOPHAGOGASTRODUODENOSCOPY    . HIP ARTHROPLASTY Left 12/28/2015   Procedure: POSTERIOR  APPROACH HEMI HIP ARTHROPLASTY;  Surgeon: Leandrew Koyanagi, MD;  Location: [place];  Service: Orthopedics;  Laterality: Left;  . IR GENERIC HISTORICAL  10/18/2016   IR PERC CHOLECYSTOSTOMY 10/18/2016 [name], MD [facility]-INTERV RAD  . LEG AMPUTATION BELOW KNEE Left 11/09/2013   DR Erlinda Hong  . LITHOTRIPSY  2001  . LOWER EXTREMITY ANGIOGRAM  Bilateral 09/12/2013   Procedure: LOWER EXTREMITY ANGIOGRAM;  Surgeon: Lorretta Harp, MD;  Location: [facility] CATH LAB;  Service: Cardiovascular;  Laterality: Bilateral;  . LOWER EXTREMITY ANGIOGRAM N/A 12/29/2013   Procedure: LOWER EXTREMITY ANGIOGRAM;  Surgeon: Lorretta Harp, MD;  Location: [facility] CATH LAB;  Service: Cardiovascular;  Laterality: N/A;  . STUMP REVISION Left 01/03/2015   Procedure: LEFT  BELOW KNEE AMPUTATION REVISION;  Surgeon: Leandrew Koyanagi, MD;  Location: [place];  Service: Orthopedics;  Laterality: Left;  . TOE AMPUTATION  10/04/2013   LEFT GREAT TOE AND 4TH TOE   /   DR Erlinda Hong  . TRANSLUMINAL ATHERECTOMY TIBIAL ARTERY Left 09/12/2013  . US ECHOCARDIOGRAPHY  03/21/2008   EF 30-35%      Social History:     Social History  Substance Use Topics  . Smoking status: Never Smoker  . Smokeless tobacco: Never Used  . Alcohol use No     Lives - at home  Mobility - with assistance     Family History :     Family History  Problem Relation Age of Onset  . Heart disease Mother   . Hypertension Mother   . Diabetes Mother   . Diabetes Father       Home Medications:   Prior to Admission medications   Medication Sig Start Date End Date Taking? Authorizing Provider  acetaminophen (TYLENOL) 325 MG tablet Take 2 tablets (650 mg total) by mouth every 6 (six) hours as needed for mild pain (or Fever >/= 101). 05/03/16  Yes Maryann Mikhail, DO  atorvastatin (LIPITOR) 10 MG tablet Take 1 tablet (10 mg total) by mouth daily. 01/15/15  Yes Daniel J Angiulli, PA-C  Cholecalciferol (VITAMIN D3) 10000 units TABS Take 1 tablet by mouth daily.   Yes Historical Provider, MD  furosemide (LASIX) 40 MG tablet Take 1 tablet (40 mg total) by mouth daily. 01/02/16  Yes Theodis Blaze, MD  insulin detemir (LEVEMIR) 100 UNIT/ML injection Inject 0.15 mLs (15 Units total) into the skin 2 (two) times daily. 10/23/16  Yes Velvet Bathe, MD  iron polysaccharides (NIFEREX) 150 MG capsule Take 1 capsule (150 mg total)  by mouth daily. 01/15/15  Yes Daniel J Angiulli, PA-C  Multiple Vitamin (MULTIVITAMIN WITH MINERALS) TABS tablet Take 1 tablet by mouth daily.   Yes Historical Provider, MD  omeprazole (PRILOSEC) 20 MG capsule Take 20 mg by mouth daily.   Yes Historical Provider, MD  sacubitril-valsartan (ENTRESTO) 24-26 MG Take 1 tablet by mouth 2 (two) times daily. 10/31/15  Yes Deboraha Sprang, MD  spironolactone (ALDACTONE) 25 MG tablet Take 1 tablet (25 mg total) by mouth daily. 11/10/16  Yes Larey Dresser, MD  carvedilol (COREG) 25 MG tablet  12/08/16   Historical Provider, MD  carvedilol (COREG) 6.25 MG tablet Take 1 tablet (6.25 mg total) by mouth 2 (two) times daily with a meal. 10/23/16   Velvet Bathe, MD  metroNIDAZOLE (FLAGYL) 500 MG tablet  12/02/16   Historical Provider, MD  pregabalin (LYRICA) 75 MG capsule Take 1 capsule (75 mg total) by mouth 2 (two) times daily. 01/15/15   Lavon Paganini Angiulli, PA-C  warfarin (COUMADIN) 10 MG tablet Take 1 tablet (10 mg total) by mouth daily. Patient taking differently: Take 10-15 mg by mouth as directed. Take 1 tablet (10 mg) by mouth daily except on Wed Take 1.5 tablets (15 mg). 10/23/16   Velvet Bathe, MD  warfarin (COUMADIN) 7.5 MG tablet Take 7.5 mg by mouth at bedtime. 11/12/16   Historical Provider, MD     Allergies:    No Known Allergies   Physical Exam:   Vitals  Blood pressure 133/65, pulse (!) 51, temperature 98 F (36.7 C), temperature source Oral, resp. rate 17, height 5\' 9"  (1.753 m), weight 95.3 kg (210 lb), SpO2 97 %.   1. General frail chronically ill appearing male lying  in bed in NAD  2. Normal affect and insight, Not Suicidal or Homicidal, Awake Alert, Oriented X 3.  3. No F.N deficits, ALL C.Nerves Intact, motor strength grossly intact, no focal deficits   4. Ears and Eyes appear Normal, Conjunctivae clear, PERRLA. Moist Oral Mucosa.  5. Supple Neck, No JVD, No cervical lymphadenopathy appriciated, No Carotid Bruits.  6. Symmetrical Chest  wall movement, Good air movement bilaterally, CTAB.  7. Regular  , No Gallops, Rubs or Murmurs, No Parasternal Heave.  8. Positive Bowel Sounds, Abdomen Soft, Jaquelyn Bitter and midepigastric, upper epigastric right epigastric area, cholecystostomy tube has been cut 2-3 cm , No organomegaly appriciated,No rebound -guarding or rigidity.  9.  No Cyanosis, Normal Skin Turgor, No Skin Rash or Bruise.  10. Good muscle tone,  joints appear normal , no effusions, Normal ROM bilateral been no knee amputation, patient currently wearing his prosthesis  11. No Palpable Lymph Nodes in Neck      Data Review:    CBC  Recent Labs Lab 12/08/16 1212  WBC 8.8  HGB 11.2*  HCT 33.9*  PLT 250  MCV 72.4*  MCH 23.9*  MCHC 33.0  RDW 18.4*  LYMPHSABS 0.6*  MONOABS 0.8  EOSABS 0.1  BASOSABS 0.0   ------------------------------------------------------------------------------------------------------------------  Chemistries   Recent Labs Lab 12/08/16 1212  NA 140  K 4.1  CL 106  CO2 22  GLUCOSE 316*  BUN 30*  CREATININE 1.40*  CALCIUM 8.9  AST 14*  ALT 19  ALKPHOS 191*  BILITOT 1.0   ------------------------------------------------------------------------------------------------------------------ estimated creatinine clearance is 53.5 mL/min (A) (by C-G formula based on SCr of 1.4 mg/dL (H)). ------------------------------------------------------------------------------------------------------------------ No results for input(s): TSH, T4TOTAL, T3FREE, THYROIDAB in the last 72 hours.  Invalid input(s): FREET3  Coagulation profile  Recent Labs Lab 12/08/16 1212  INR 1.84   ------------------------------------------------------------------------------------------------------------------- No results for input(s): DDIMER in the last 72 hours. -------------------------------------------------------------------------------------------------------------------  Cardiac Enzymes No results  for input(s): CKMB, TROPONINI, MYOGLOBIN in the last 168 hours.  Invalid input(s): CK ------------------------------------------------------------------------------------------------------------------    Component Value Date/Time   BNP 866.4 (H) 11/10/2016 1101     ---------------------------------------------------------------------------------------------------------------  Urinalysis    Component Value Date/Time   COLORURINE AMBER (A) 12/08/2016 1131   APPEARANCEUR TURBID (A) 12/08/2016 1131   LABSPEC 1.015 12/08/2016 1131   PHURINE 5.0 12/08/2016 1131   GLUCOSEU 150 (A) 12/08/2016 1131   HGBUR MODERATE (A) 12/08/2016 1131   BILIRUBINUR NEGATIVE 12/08/2016 1131   KETONESUR 5 (A) 12/08/2016 1131   PROTEINUR 100 (A) 12/08/2016 1131   UROBILINOGEN 0.2 01/11/2015 1850   NITRITE NEGATIVE 12/08/2016 1131   LEUKOCYTESUR LARGE (A) 12/08/2016 1131    ----------------------------------------------------------------------------------------------------------------   Imaging Results:    Dg Chest 2 View  Result Date: 12/08/2016 CLINICAL DATA:  Increasing generalized weakness since Friday, per wife pt also has gb stent that broke and is draining everywhere, no other chest complaints EXAM: CHEST  2 VIEW COMPARISON:  10/17/2016 FINDINGS: There is a linear band of airspace disease in the right middle lobe likely reflecting atelectasis. There is no pleural effusion or pneumothorax. The heart and mediastinal contours are unremarkable. There is a 3 lead cardiac pacemaker. The osseous structures are unremarkable. IMPRESSION: No active cardiopulmonary disease. Electronically Signed   By: Kathreen Devoid   On: 12/08/2016 11:57   Ct Abdomen Pelvis W Contrast  Result Date: 12/08/2016 CLINICAL DATA:  Generalized weakness. History of cholecystostomy tube. EXAM: CT ABDOMEN AND PELVIS WITH CONTRAST TECHNIQUE: Multidetector CT imaging of the abdomen and  pelvis was performed using the standard protocol  following bolus administration of intravenous contrast. CONTRAST:  151mL ISOVUE-300 IOPAMIDOL (ISOVUE-300) INJECTION 61% COMPARISON:  10/17/2016 FINDINGS: Lower chest:  Bibasilar dependent atelectasis. Hepatobiliary: 13 mm subcapsular lesion lateral segment left liver with hyper perfusion in the surrounding parenchyma. This appears progressed since prior study. 13 mm hypervascular lesion noted in the hepatic dome (image 15 series 2). The cholecystostomy tube visualized with loop formed in the region of the gallbladder neck. Gas in the gallbladder lumen is compatible with the presence of the drain. Insert no biliary Pancreas: 2.1 x 2.5 cm lesion identified in the body the pancreas with markedly atrophic pancreatic tail and dilated duct in the tail the pancreas that terminates at the level of the mass. Spleen: No splenomegaly. No focal mass lesion. Adrenals/Urinary Tract: No adrenal nodule or mass. Kidneys unremarkable. No evidence for hydroureter. Left bladder wall obscured by streak artifact from hip replacement. Stomach/Bowel: Stomach is nondistended. No gastric wall thickening. No evidence of outlet obstruction. Duodenum is normally positioned as is the ligament of Treitz. No small bowel wall thickening. No small bowel dilatation. The terminal ileum is normal. The appendix is normal. Irregular wall thickening in the hepatic flexure likely secondary to the gallbladder process. Transverse and descending colon unremarkable. Diverticular changes noted sigmoid colon circumferential wall thickening is seen in the rectum and there is a 1.6 cm rim enhancing structure just posterior to the anal rectal junction. Vascular/Lymphatic: There is abdominal aortic atherosclerosis without aneurysm. There is no gastrohepatic or hepatoduodenal ligament lymphadenopathy. No intraperitoneal or retroperitoneal lymphadenopathy. No pelvic sidewall lymphadenopathy. Reproductive: The prostate gland and seminal vesicles have normal imaging  features. Other: No intraperitoneal free fluid. Musculoskeletal: Status post left total hip replacement. Bone windows reveal no worrisome lytic or sclerotic osseous lesions. IMPRESSION: 1. 2.5 cm pancreatic body lesion obstructs the main pancreatic duct in the tail the pancreas generating marked pancreatic tail atrophy. Imaging features highly suspicious for pancreatic neoplasm and adenocarcinoma a distinct concern. 2. Hypervascular liver lesions raise concern for metastatic disease given findings described immediately above. 3. Circumferential wall thickening in the distal rectum with a small rim enhancing focus just posterior to the anal verge. Perirectal abscess could have this appearance. 4. Cholecystostomy tube with irregular gallbladder wall thickening. Pericholecystic stranding is associated with thickening of the colonic wall in the region the hepatic flexure, likely secondary to the primary gallbladder process. 5.  Abdominal Aortic Atherosclerois (ICD10-170.0) Results Were called by me at the time of interpretation on 12/08/2016 at 4:22 pm to Dr. Margarita Mail , who verbally acknowledged these results. Electronically Signed   By: Misty Stanley M.D.   On: 12/08/2016 16:23      Assessment & Plan:    Active Problems:   NICM- EF 20-25% echo 8/29   SYSTOLIC HEART FAILURE, CHRONIC   HTN (hypertension)   BiV ICD (BS).  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  Abormal finding in CT  imaging of the rectal area - Per radiology  reading this is suspicious for perirectal abscess, will start empirically on IV Zosyn, will consult surgery regarding further recommendation.  Failure to thrive, progressive weakness - This is most likely related to above, will consult PT  Cholecystitis - Chronic, status post cholecystostomy, a shunt has seen by Dr. Dema Severin in the outpatient setting with scheduled follow-up in May, continue with cystostomy tube, IR has been consulted by ED as bag has been recently separated.  Atrial fibrillation - Currently rate controlled, with heart rate in the 50s, so would hold Coreg, will hold warfarin for possible need for procedures or biopsy.  Bilateral renal knee amputation - We'll consult PT, has his prosthesis  Anemia of chronic disease - At baseline, continue to monitor  CK D stage III - Creatinine at baseline, continue to monitor  Diabetes mellitus - Hold oral hypoglycemic agent and continue with insulin sliding scale during hospital stay  Chronic Systolic CHF with Cardiomyopathy with EF 20-25% status post AICD placement - We'll hold Coreg given her bradycardia, continue with Aldactone and Entresto  Hyperlipidemia - Resume statin when stable  Hypertension - Blood pressure acceptable, continue with Entresto, holding Coreg given heart rate of 50   DVT Prophylaxis INR 1.84 - SCDs  AM Labs Ordered, also please review Full Orders  Family Communication: Admission, patients condition and plan of care including tests being ordered have been discussed with the patient and wife who indicate understanding and agree with the plan and Code Status.  Code Status Full  Likely DC to  :home  Condition GUARDED    Consults called: IR , GI dr Michail Sermon, Sx Dr Hassell Done  Admission status: inpatient  Time spent in minutes : 65 Lacinda Axon, DAWOOD M.D on 12/08/2016 at 5:24 PM  Between 7am to 7pm - Pager - [phone]. After 7pm go to  [email or web address] - password [email or web address]  [facility] - Office  [phone]

## 2016-12-08 NOTE — ED Notes (Signed)
Gave report to Daneil Dan, RN for assigned room 1344.

## 2016-12-08 NOTE — Progress Notes (Signed)
Patient ID: [name] [name], male   DOB: Apr 07, 1946, 71 y.o.   MRN: [id number] Contacted by ED physician about leaking cholecystostomy tube.   The catheter broke yesterday according to patient and family. The catheter is still intact but the hub of the catheter has completely come off and bile is draining through the catheter onto the patient and gauze. Patient needs a tube exchange.  Will exchange tube this evening.

## 2016-12-08 NOTE — Consult Note (Signed)
Chief Complaint:  Asked by [name] [name] to see regarding possible perirectal abscess  History of Present Illness:  [name] [name] is an 71 y.o. male who has had biltateral BKAs for DM complications and has A fib on chronic anticoagulants and was recently treated by [name] [name] for cholecystitis with a percutaneous cholecystostomy tube.  This broke and that brought him to the ER.    CT scan was obtained that showed pancreatic mass with possible hepatic mets.  Will need perc bx.  I was asked to see regarding the rectal thickening noted on the CT scan.    [name] [name] denies any pain or problem with his bottom.  Rectal exam by me showed a patulous rectum and without pain.  No evidence of impaction or retained stool.    Past Medical History:  Diagnosis Date  . Anemia    takes Iron pill daily  . Arthritis   . Automatic implantable cardioverter-defibrillator in situ    a. 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  . CAD (coronary artery disease)   . Cardiomyopathy, nonischemic ([place])    a. Cath 2003: mild nonobstructive CAD, EF 25% at that time.  . Chronic systolic CHF (congestive heart failure) (HCC)    takes Furosemide daily as well as Aldactone  . CKD (chronic kidney disease), stage III   . Complication of anesthesia   . Constipation    takes Sennokot daily  . Dementia   . GERD (gastroesophageal reflux disease)    takes Nexium daily  . Headache    occasionally  . High cholesterol    takes Atorvastatin daily  . History of blood transfusion    no abnormal reaction noted  . History of bronchitis    > 1 yr ago  . History of kidney stones   . Hypertension    takes Coreg daily  . LBBB (left bundle branch block)    takes Coumadin daily  . NSVT (nonsustained ventricular tachycardia) ([place])    a. Noted on ICD interrogation in 2011.  [name] PAD (peripheral artery disease) ([place])    a. 08/2013 Periph Angio/PTA: Abd Ao nl, RLE- 3v runoff, PT diff dzs, AT 90p, LLE 2v  runoff, PT 100, AT 53m(diamondback ORA/chocolate balloon PTA).  .Marland KitchenPAF (paroxysmal atrial fibrillation) ([place]    a. Noted on ICD interrogation 2012;  b. coumadin d/c'd 01/2013.  .Marland KitchenPAF (paroxysmal atrial fibrillation) ([place]   . Peripheral neuropathy    hands;numbness and tingling   . Pneumonia    hx of > 114yrgo  . PONV (postoperative nausea and vomiting)   . Type II diabetes mellitus ([facility]   takes Levemir daily as well as Novolog  . Urinary frequency   . Urinary urgency     Past Surgical History:  Procedure Laterality Date  . ABDOMINAL ANGIOGRAM  09/12/2013   Procedure: ABDOMINAL ANGIOGRAM;  Surgeon: JoLorretta HarpMD;  [facility];  Service: Cardiovascular;;  . AMPUTATION Left 10/04/2013   Procedure: LEFT GREAT TOE AND SECOND TOE AMPUTATION;  Surgeon: NaMarianna PaymentMD;  [facility] [facility] Service: Orthopedics;  Laterality: Left;  . AMPUTATION Left 11/09/2013   Procedure: LEFT AMPUTATION BELOW KNEE;  Surgeon: NaMarianna PaymentMD;  Location: [facility] Service: Orthopedics;  Laterality: Left;  . AMPUTATION Right 01/04/2014   Procedure: GrDoristine Devoidecond and third toe amputation;  Surgeon: NaMarianna PaymentMD;  [place] [facility] [place];  Laterality: Right;  . AMPUTATION  Right 01/30/2014   Procedure: RIGHT BELOW KNEE AMPUTATION;  Surgeon: [name] Payment, MD;  Location: [place];  Service: Orthopedics;  Laterality: Right;  . ATHERECTOMY Right 12/29/2013   Procedure: ATHERECTOMY;  Surgeon: Lorretta Harp, MD;  [facility];  Service: Cardiovascular;  Laterality: Right;  Anterior Tibial Artery  . CARDIAC CATHETERIZATION  10/01/2001   THERE WAS GLOBAL HYPOKINESIS AND EF 25%. THERE APPEARED TO BE GLOBAL DECREASE IN WALL MOTION  . CARDIAC DEFIBRILLATOR PLACEMENT  06/2009   WITH GENERATOR REPLACED; BiV ICD  . CARDIOVASCULAR STRESS TEST  03/20/2009   EF 33%  . [place]  . COLONOSCOPY    . ESOPHAGOGASTRODUODENOSCOPY    . HIP ARTHROPLASTY  Left 12/28/2015   Procedure: POSTERIOR  APPROACH HEMI HIP ARTHROPLASTY;  Surgeon: [name] Koyanagi, MD;  Location: [place];  Service: Orthopedics;  Laterality: Left;  . IR GENERIC HISTORICAL  10/18/2016   IR PERC CHOLECYSTOSTOMY 10/18/2016 [name] Keen, MD [facility]  . LEG AMPUTATION BELOW KNEE Left 11/09/2013   DR [name] [name]  . LITHOTRIPSY  2001  . LOWER EXTREMITY ANGIOGRAM Bilateral 09/12/2013   Procedure: LOWER EXTREMITY ANGIOGRAM;  Surgeon: Lorretta Harp, MD;  Location[facility] C[facility] LAB;  Service: Cardiovascular;  Laterality: Bilateral;  . LOWER EXTREMITY ANGIOGRAM N/A 12/29/2013   Procedure: LOWER EXTREMITY ANGIOGRAM;  Surgeon: Lorretta Harp, MD;  [facility] LAB;  Service: Cardiovascular;  Laterality: N/A;  . STUMP REVISION Left 01/03/2015   Procedure: LEFT BELOW KNEE AMPUTATION REVISION;  Surgeon: [name] Koyanagi, MD;  Location: [place];  Service: Orthopedics;  Laterality: Left;  . TOE AMPUTATION  10/04/2013   LEFT GREAT TOE AND 4TH TOE   /   DR [name] [name]  . TRANSLUMINAL ATHERECTOMY TIBIAL ARTERY Left 09/12/2013  . US ECHOCARDIOGRAPHY  03/21/2008   EF 30-35%    Current Facility-Administered Medications  Medication Dose Route Frequency Provider Last Rate Last Dose  . 0.9 %  sodium chloride infusion   Intravenous Continuous [name] Patricia, MD 50 mL/hr at 12/08/16 1731    . acetaminophen (TYLENOL) tablet 650 mg  650 mg Oral Q6H PRN [name] Patricia, MD       Or  . acetaminophen (TYLENOL) suppository 650 mg  650 mg Rectal Q6H PRN [name] Huguenin Elgergawy, MD      . albuterol (PROVENTIL) (2.5 MG/3ML) 0.083% nebulizer solution 2.5 mg  2.5 mg Nebulization Q2H PRN [name] Patricia, MD      . HYDROcodone-acetaminophen (NORCO/VICODIN) 5-325 MG per tablet 1-2 tablet  1-2 tablet Oral Q4H PRN [name] Huguenin Elgergawy, MD      . insulin aspart (novoLOG) injection 0-5 Units  0-5 Units Subcutaneous QHS [name] Patricia, MD      . Derrill Memo ON 12/09/2016] insulin aspart (novoLOG) injection 0-9 Units  0-9 Units Subcutaneous TID  WC Dawood S Elgergawy, MD      . insulin detemir (LEVEMIR) injection 10 Units  10 Units Subcutaneous BID Dawood S Elgergawy, MD      . iopamidol (ISOVUE-300) 61 % injection           . iopamidol (ISOVUE-300) 61 % injection    PRN Markus Daft, MD   5 mL at 12/08/16 1820  . lidocaine (XYLOCAINE) 1 % (with pres) injection           . lidocaine (XYLOCAINE) 1 % (with pres) injection    PRN Markus Daft, MD   5 mL at 12/08/16 1120  . pantoprazole (PROTONIX)  EC tablet 40 mg  40 mg Oral BID [name] Patricia, MD      . Derrill Memo ON 12/09/2016] piperacillin-tazobactam (ZOSYN) IVPB 3.375 g  3.375 g Intravenous Q8H Dawood S Elgergawy, MD      . sacubitril-valsartan (ENTRESTO) 24-26 mg per tablet  1 tablet Oral BID [name] Patricia, MD      . spironolactone (ALDACTONE) tablet 25 mg  25 mg Oral Daily [name] Patricia, MD       Patient has no known allergies. Family History  Problem Relation Age of Onset  . Heart disease Mother   . Hypertension Mother   . Diabetes Mother   . Diabetes Father    Social History:   reports that he has never smoked. He has never used smokeless tobacco. He reports that he does not drink alcohol or use drugs.   REVIEW OF SYSTEMS : Negative except for extensive problem list  Physical Exam:   Blood pressure 119/77, pulse 100, temperature 99 F (37.2 C), temperature source Oral, resp. rate 20, height _0  (1.753 m), weight 95.3 kg (210 lb), SpO2 95 %. Body mass index is 31.01 kg/m.  Gen:  WDWN AAM NAD  Neurological: Alert and oriented to person, place, and time. Motor and sensory function is grossly intact  Head: Normocephalic and atraumatic.  Eyes: Conjunctivae are normal. Pupils are equal, round, and reactive to light. No scleral icterus.  GU:  No tenderness or masses noted in the perirectal area.   Musculoskeletal: Normal range of motion. Extremities are nontender. No cyanosis, edema or clubbing noted Lymphadenopathy: No cervical, preauricular, postauricular or axillary  adenopathy is present Skin: Skin is warm and dry. No rash noted. No diaphoresis. No erythema. No pallor. Pscyh: Normal mood and affect. Behavior is normal. Judgment and thought content normal.   LABORATORY RESULTS: Results for orders placed or performed during the hospital encounter of 12/08/16 (from the past 48 hour(s))  Urinalysis, Routine w reflex microscopic     Status: Abnormal   Collection Time: 12/08/16 11:31 AM  Result Value Ref Range   Color, Urine AMBER (A) YELLOW    Comment: BIOCHEMICALS MAY BE AFFECTED BY COLOR   APPearance TURBID (A) CLEAR   Specific Gravity, Urine 1.015 1.005 - 1.030   pH 5.0 5.0 - 8.0   Glucose, UA 150 (A) NEGATIVE mg/dL   Hgb urine dipstick MODERATE (A) NEGATIVE   Bilirubin Urine NEGATIVE NEGATIVE   Ketones, ur 5 (A) NEGATIVE mg/dL   Protein, ur 100 (A) NEGATIVE mg/dL   Nitrite NEGATIVE NEGATIVE   Leukocytes, UA LARGE (A) NEGATIVE   RBC / HPF TOO NUMEROUS TO COUNT 0 - 5 RBC/hpf   WBC, UA TOO NUMEROUS TO COUNT 0 - 5 WBC/hpf   Bacteria, UA MANY (A) NONE SEEN   Squamous Epithelial / LPF NONE SEEN NONE SEEN   WBC Clumps PRESENT   Comprehensive metabolic panel     Status: Abnormal   Collection Time: 12/08/16 12:12 PM  Result Value Ref Range   Sodium 140 135 - 145 mmol/L   Potassium 4.1 3.5 - 5.1 mmol/L   Chloride 106 101 - 111 mmol/L   CO2 22 22 - 32 mmol/L   Glucose, Bld 316 (H) 65 - 99 mg/dL   BUN 30 (H) 6 - 20 mg/dL   Creatinine, Ser 1.40 (H) 0.61 - 1.24 mg/dL   Calcium 8.9 8.9 - 10.3 mg/dL   Total Protein 7.6 6.5 - 8.1 g/dL   Albumin 2.9 (L) 3.5 - 5.0  g/dL   AST 14 (L) 15 - 41 U/L   ALT 19 17 - 63 U/L   Alkaline Phosphatase 191 (H) 38 - 126 U/L   Total Bilirubin 1.0 0.3 - 1.2 mg/dL   GFR calc non Af Amer 48 (L) >60 mL/min   GFR calc Af Amer 56 (L) >60 mL/min    Comment: (NOTE) The eGFR has been calculated using the CKD EPI equation. This calculation has not been validated in all clinical situations. eGFR's persistently <60 mL/min signify  possible Chronic Kidney Disease.    Anion gap 12 5 - 15  CBC with Differential     Status: Abnormal   Collection Time: 12/08/16 12:12 PM  Result Value Ref Range   WBC 8.8 4.0 - 10.5 K/uL   RBC 4.68 4.22 - 5.81 MIL/uL   Hemoglobin 11.2 (L) 13.0 - 17.0 g/dL   HCT 33.9 (L) 39.0 - 52.0 %   MCV 72.4 (L) 78.0 - 100.0 fL   MCH 23.9 (L) 26.0 - 34.0 pg   MCHC 33.0 30.0 - 36.0 g/dL   RDW 18.4 (H) 11.5 - 15.5 %   Platelets 250 150 - 400 K/uL   Neutrophils Relative % 83 %   Neutro Abs 7.3 1.7 - 7.7 K/uL   Lymphocytes Relative 7 %   Lymphs Abs 0.6 (L) 0.7 - 4.0 K/uL   Monocytes Relative 9 %   Monocytes Absolute 0.8 0.1 - 1.0 K/uL   Eosinophils Relative 1 %   Eosinophils Absolute 0.1 0.0 - 0.7 K/uL   Basophils Relative 0 %   Basophils Absolute 0.0 0.0 - 0.1 K/uL  Lipase, blood     Status: Abnormal   Collection Time: 12/08/16 12:12 PM  Result Value Ref Range   Lipase 53 (H) 11 - 51 U/L  Protime-INR     Status: Abnormal   Collection Time: 12/08/16 12:12 PM  Result Value Ref Range   Prothrombin Time 21.5 (H) 11.4 - 15.2 seconds   INR 1.84   I-Stat CG4 Lactic Acid, ED     Status: None   Collection Time: 12/08/16 12:21 PM  Result Value Ref Range   Lactic Acid, Venous 1.28 0.5 - 1.9 mmol/L  POC occult blood, ED     Status: Abnormal   Collection Time: 12/08/16 12:38 PM  Result Value Ref Range   Fecal Occult Bld POSITIVE (A) NEGATIVE     RADIOLOGY RESULTS: Dg Chest 2 View  Result Date: 12/08/2016 CLINICAL DATA:  Increasing generalized weakness since Friday, per wife pt also has gb stent that broke and is draining everywhere, no other chest complaints EXAM: CHEST  2 VIEW COMPARISON:  10/17/2016 FINDINGS: There is a linear band of airspace disease in the right middle lobe likely reflecting atelectasis. There is no pleural effusion or pneumothorax. The heart and mediastinal contours are unremarkable. There is a 3 lead cardiac pacemaker. The osseous structures are unremarkable. IMPRESSION: No  active cardiopulmonary disease. Electronically Signed   By: Kathreen Devoid   On: 12/08/2016 11:57   Ct Abdomen Pelvis W Contrast  Result Date: 12/08/2016 CLINICAL DATA:  Generalized weakness. History of cholecystostomy tube. EXAM: CT ABDOMEN AND PELVIS WITH CONTRAST TECHNIQUE: Multidetector CT imaging of the abdomen and pelvis was performed using the standard protocol following bolus administration of intravenous contrast. CONTRAST:  124m ISOVUE-300 IOPAMIDOL (ISOVUE-300) INJECTION 61% COMPARISON:  10/17/2016 FINDINGS: Lower chest:  Bibasilar dependent atelectasis. Hepatobiliary: 13 mm subcapsular lesion lateral segment left liver with hyper perfusion in the surrounding parenchyma. This  appears progressed since prior study. 13 mm hypervascular lesion noted in the hepatic dome (image 15 series 2). The cholecystostomy tube visualized with loop formed in the region of the gallbladder neck. Gas in the gallbladder lumen is compatible with the presence of the drain. Insert no biliary Pancreas: 2.1 x 2.5 cm lesion identified in the body the pancreas with markedly atrophic pancreatic tail and dilated duct in the tail the pancreas that terminates at the level of the mass. Spleen: No splenomegaly. No focal mass lesion. Adrenals/Urinary Tract: No adrenal nodule or mass. Kidneys unremarkable. No evidence for hydroureter. Left bladder wall obscured by streak artifact from hip replacement. Stomach/Bowel: Stomach is nondistended. No gastric wall thickening. No evidence of outlet obstruction. Duodenum is normally positioned as is the ligament of Treitz. No small bowel wall thickening. No small bowel dilatation. The terminal ileum is normal. The appendix is normal. Irregular wall thickening in the hepatic flexure likely secondary to the gallbladder process. Transverse and descending colon unremarkable. Diverticular changes noted sigmoid colon circumferential wall thickening is seen in the rectum and there is a 1.6 cm rim  enhancing structure just posterior to the anal rectal junction. Vascular/Lymphatic: There is abdominal aortic atherosclerosis without aneurysm. There is no gastrohepatic or hepatoduodenal ligament lymphadenopathy. No intraperitoneal or retroperitoneal lymphadenopathy. No pelvic sidewall lymphadenopathy. Reproductive: The prostate gland and seminal vesicles have normal imaging features. Other: No intraperitoneal free fluid. Musculoskeletal: Status post left total hip replacement. Bone windows reveal no worrisome lytic or sclerotic osseous lesions. IMPRESSION: 1. 2.5 cm pancreatic body lesion obstructs the main pancreatic duct in the tail the pancreas generating marked pancreatic tail atrophy. Imaging features highly suspicious for pancreatic neoplasm and adenocarcinoma a distinct concern. 2. Hypervascular liver lesions raise concern for metastatic disease given findings described immediately above. 3. Circumferential wall thickening in the distal rectum with a small rim enhancing focus just posterior to the anal verge. Perirectal abscess could have this appearance. 4. Cholecystostomy tube with irregular gallbladder wall thickening. Pericholecystic stranding is associated with thickening of the colonic wall in the region the hepatic flexure, likely secondary to the primary gallbladder process. 5.  Abdominal Aortic Atherosclerois (ICD10-170.0) Results Were called by me at the time of interpretation on 12/08/2016 at 4:22 pm to Dr. Margarita Mail , who verbally acknowledged these results. Electronically Signed   By: Misty Stanley M.D.   On: 12/08/2016 16:23    Problem List: Patient Active Problem List   Diagnosis Date Noted  . Pancreatic mass 12/08/2016  . Abdominal pain   . Cholecystitis   . NICM (nonischemic cardiomyopathy) ([place])   . Stage 3 chronic kidney disease   . Hypernatremia   . Systolic CHF, acute on chronic ([place]) 10/13/2016  . Diarrhea 10/13/2016  . Acute on chronic systolic congestive heart  failure ([place])   . Diabetes mellitus with complication ([place])   . HCAP (healthcare-associated pneumonia) 04/30/2016  . Weakness generalized 04/30/2016  . Status post hip hemiarthroplasty   . Urinary retention   . Dementia   . AKI (acute kidney injury) ([place])   . Thrombocytopenia ([place])   . Anemia of chronic disease   . Closed left hip fracture ([place]) 12/28/2015  . Acute renal failure superimposed on stage 3 chronic kidney disease ([place]) 12/28/2015  . Fall   . Diabetes mellitus with neuropathy ([place]) 01/10/2015  . Amputation of right lower extremity below knee ([place]) 01/08/2015  . Below knee amputation status ([place]) 01/03/2015  . Atrial fibrillation [I48.91] 11/01/2014  . Encounter for therapeutic drug  monitoring 11/01/2014  . S/P bilateral BKA (below knee amputation) ([place]) 02/02/2014  . Osteomyelitis of right foot ([place]) 01/30/2014  . 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 ([place]) 01/04/2014  . Osteomyelitis of toe of right foot ([place]) 01/04/2014  . S/P Lt BKA 11/09/13 11/14/2013  . Acute blood loss anemia 11/11/2013  . Type II or unspecified type diabetes mellitus 11/09/2013  . Lower limb amputation, great toe ([place]) 10/11/2013  . Lower limb amputation, other toe(s) 10/11/2013  . Chronic osteomyelitis of toe of left foot ([place]) 10/04/2013  . Foot osteomyelitis, left ([place]) 10/04/2013  . Uncontrolled pain, Lt toe 09/21/2013  . Gangrenous toe, Lt toe 09/21/2013  . PAD (peripheral artery disease) ([place]) 09/13/2013  . Diabetes mellitus ([place]) 09/13/2013  . Hyperlipidemia 09/13/2013  . PAF (paroxysmal atrial fibrillation) ([place]) 09/13/2013  . Critical lower limb ischemia- s/p Rt anterior tibial PTA 12/29/13 in preparation for Rt BKA 08/30/2013  . Generalized weakness 02/12/2013  . 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 HTN (hypertension) 04/08/2011  . NICM- EF 20-25% echo 6/14 09/29/2008  . LBBB 09/29/2008  . SYSTOLIC HEART FAILURE, CHRONIC 09/29/2008    Assessment &  Plan: Would hold anticoagulants and bx pancreas lesion and liver lesion.  Cholecystostomy tube was replaced this evening in IR.   CCS will follow up to make sure that something doesn't develop but for now the pancreas workup seems most important.      Matt B. Hassell Done, MD, [facility] [phone] beeper [phone]  12/08/2016 7:03 PM

## 2016-12-08 NOTE — Progress Notes (Signed)
Pharmacy Antibiotic Note  [name] [name] is a 71 y.o. male admitted on 12/08/2016. Pharmacy has been consulted for Zosyn dosing for possible perirectal abscess.   Plan: Zosyn 3.375g IV x 1 over 30 minutes, then Zosyn 3.375g IV q8h (infuse over 4 hours). Monitor renal function, cultures, clinical course.   Height: 5\' 9"  (175.3 cm) Weight: 210 lb (95.3 kg) IBW/kg (Calculated) : 70.7  Temp (24hrs), Avg:98.3 F (36.8 C), Min:97.8 F (36.6 C), Max:99 F (37.2 C)   Recent Labs Lab 12/08/16 1212 12/08/16 1221  WBC 8.8  --   CREATININE 1.40*  --   LATICACIDVEN  --  1.28    Estimated Creatinine Clearance: 53.5 mL/min (A) (by C-G formula based on SCr of 1.4 mg/dL (H)).    No Known Allergies  Antimicrobials this admission: 4/16 >> Zosyn >>  Dose adjustments this admission: --  Microbiology results: None ordered  Thank you for allowing pharmacy to be a part of this patient's care.    Lindell Spar, PharmD, BCPS Pager: [phone] 12/08/2016 5:45 PM

## 2016-12-08 NOTE — ED Notes (Signed)
ED Provider at bedside. 

## 2016-12-09 ENCOUNTER — Encounter (HOSPITAL_COMMUNITY): Payer: Self-pay | Admitting: Surgery

## 2016-12-09 DIAGNOSIS — K769 Liver disease, unspecified: Secondary | ICD-10-CM

## 2016-12-09 DIAGNOSIS — Z7901 Long term (current) use of anticoagulants: Secondary | ICD-10-CM

## 2016-12-09 DIAGNOSIS — K869 Disease of pancreas, unspecified: Secondary | ICD-10-CM

## 2016-12-09 LAB — COMPREHENSIVE METABOLIC PANEL
ALT: 14 U/L — AB (ref 17–63)
AST: 10 U/L — AB (ref 15–41)
Albumin: 2.5 g/dL — ABNORMAL LOW (ref 3.5–5.0)
Alkaline Phosphatase: 136 U/L — ABNORMAL HIGH (ref 38–126)
Anion gap: 8 (ref 5–15)
BILIRUBIN TOTAL: 0.8 mg/dL (ref 0.3–1.2)
BUN: 25 mg/dL — AB (ref 6–20)
CO2: 24 mmol/L (ref 22–32)
CREATININE: 1.36 mg/dL — AB (ref 0.61–1.24)
Calcium: 8.3 mg/dL — ABNORMAL LOW (ref 8.9–10.3)
Chloride: 108 mmol/L (ref 101–111)
GFR, EST AFRICAN AMERICAN: 58 mL/min — AB (ref >60)
GFR, EST NON AFRICAN AMERICAN: 50 mL/min — AB (ref >60)
Glucose, Bld: 320 mg/dL — ABNORMAL HIGH (ref 65–99)
Potassium: 3.6 mmol/L (ref 3.5–5.1)
Sodium: 140 mmol/L (ref 135–145)
Total Protein: 6.5 g/dL (ref 6.5–8.1)

## 2016-12-09 LAB — CBC
HCT: 29.5 % — ABNORMAL LOW (ref 39.0–52.0)
Hemoglobin: 9.7 g/dL — ABNORMAL LOW (ref 13.0–17.0)
MCH: 23.7 pg — ABNORMAL LOW (ref 26.0–34.0)
MCHC: 32.9 g/dL (ref 30.0–36.0)
MCV: 72.1 fL — AB (ref 78.0–100.0)
PLATELETS: 248 10*3/uL (ref 150–400)
RBC: 4.09 MIL/uL — ABNORMAL LOW (ref 4.22–5.81)
RDW: 18.3 % — ABNORMAL HIGH (ref 11.5–15.5)
WBC: 6.4 10*3/uL (ref 4.0–10.5)

## 2016-12-09 LAB — GLUCOSE, CAPILLARY
GLUCOSE-CAPILLARY: 273 mg/dL — AB (ref 65–99)
GLUCOSE-CAPILLARY: 60 mg/dL — AB (ref 65–99)
GLUCOSE-CAPILLARY: 65 mg/dL (ref 65–99)
Glucose-Capillary: 356 mg/dL — ABNORMAL HIGH (ref 65–99)
Glucose-Capillary: 239 mg/dL — ABNORMAL HIGH (ref 65–99)
Glucose-Capillary: 135 mg/dL — ABNORMAL HIGH (ref 65–99)
Glucose-Capillary: 95 mg/dL (ref 65–99)

## 2016-12-09 LAB — MRSA PCR SCREENING: MRSA BY PCR: POSITIVE — AB

## 2016-12-09 LAB — PROTIME-INR
INR: 2.34
Prothrombin Time: 26.1 seconds — ABNORMAL HIGH (ref 11.4–15.2)

## 2016-12-09 MED ORDER — CHLORHEXIDINE GLUCONATE CLOTH 2 % EX PADS
6.0000 | MEDICATED_PAD | Freq: Every day | CUTANEOUS | Status: AC
  Administered 2016-12-09 – 2016-12-13 (×4): 6 via TOPICAL

## 2016-12-09 MED ORDER — INSULIN DETEMIR 100 UNIT/ML ~~LOC~~ SOLN
11.0000 [IU] | Freq: Two times a day (BID) | SUBCUTANEOUS | Status: DC
  Administered 2016-12-10 – 2016-12-16 (×13): 11 [IU] via SUBCUTANEOUS
  Filled 2016-12-09 (×14): qty 0.11

## 2016-12-09 MED ORDER — VITAMIN K1 10 MG/ML IJ SOLN
10.0000 mg | Freq: Once | INTRAVENOUS | Status: AC
  Administered 2016-12-09: 10 mg via INTRAVENOUS
  Filled 2016-12-09: qty 1

## 2016-12-09 MED ORDER — DEXTROSE 50 % IV SOLN
25.0000 mL | Freq: Once | INTRAVENOUS | Status: AC
  Administered 2016-12-09: 25 mL via INTRAVENOUS

## 2016-12-09 MED ORDER — SACCHAROMYCES BOULARDII 250 MG PO CAPS
250.0000 mg | ORAL_CAPSULE | Freq: Two times a day (BID) | ORAL | Status: DC
  Administered 2016-12-09 – 2016-12-16 (×14): 250 mg via ORAL
  Filled 2016-12-09 (×15): qty 1

## 2016-12-09 MED ORDER — CARVEDILOL 6.25 MG PO TABS
6.2500 mg | ORAL_TABLET | Freq: Two times a day (BID) | ORAL | Status: DC
  Administered 2016-12-09 – 2016-12-16 (×14): 6.25 mg via ORAL
  Filled 2016-12-09 (×14): qty 1

## 2016-12-09 MED ORDER — MUPIROCIN 2 % EX OINT
1.0000 "application " | TOPICAL_OINTMENT | Freq: Two times a day (BID) | CUTANEOUS | Status: AC
  Administered 2016-12-09 – 2016-12-13 (×10): 1 via NASAL
  Filled 2016-12-09 (×2): qty 22

## 2016-12-09 MED ORDER — INSULIN DETEMIR 100 UNIT/ML ~~LOC~~ SOLN
22.0000 [IU] | Freq: Two times a day (BID) | SUBCUTANEOUS | Status: DC
  Filled 2016-12-09: qty 0.22

## 2016-12-09 MED ORDER — INSULIN DETEMIR 100 UNIT/ML ~~LOC~~ SOLN
15.0000 [IU] | Freq: Two times a day (BID) | SUBCUTANEOUS | Status: DC
  Administered 2016-12-09: 15 [IU] via SUBCUTANEOUS
  Filled 2016-12-09 (×2): qty 0.15

## 2016-12-09 NOTE — Progress Notes (Signed)
Subjective: CC:  Weakness  We are ask to see for possible perirectal abscess this Admit.  I examined him and I do not see anything and he does not complain of pain in any particular site.   Cholecystostomy drain working well.    Objective: Vital signs in last 24 hours: Temp:  [98 F (36.7 C)-99 F (37.2 C)] 98.4 F (36.9 C) (04/17 0455) Pulse Rate:  [51-101] 83 (04/17 0455) Resp:  [17-20] 18 (04/17 0455) BP: (119-136)/(65-84) 128/84 (04/17 0455) SpO2:  [95 %-100 %] 100 % (04/17 0455) Last BM Date: 12/09/16 240 PO IV 600 Urine 1300 BM x 4 Drain 0 Afebrile, VSS Creatinine 1.36, glucose 320 H/H down some Intake/Output from previous day: 04/16 0701 - 04/17 0700 In: 840 [P.O.:240; I.V.:600] Out: 1300 [Urine:1300] Intake/Output this shift: Total I/O In: 120 [P.O.:120] Out: -   General appearance: alert, cooperative and no distress GI: soft, non-tender; bowel sounds normal; no masses,  no organomegaly and drain in place and working well Skin: Skin color, texture, turgor normal. No rashes or lesions  On buttocks.    Lab Results:   Recent Labs  12/08/16 1212 12/09/16 0351  WBC 8.8 6.4  HGB 11.2* 9.7*  HCT 33.9* 29.5*  PLT 250 248    BMET  Recent Labs  12/08/16 1212 12/09/16 0351  NA 140 140  K 4.1 3.6  CL 106 108  CO2 22 24  GLUCOSE 316* 320*  BUN 30* 25*  CREATININE 1.40* 1.36*  CALCIUM 8.9 8.3*   PT/INR  Recent Labs  12/08/16 1212 12/09/16 1020  LABPROT 21.5* 26.1*  INR 1.84 2.34     Recent Labs Lab 12/08/16 1212 12/09/16 0351  AST 14* 10*  ALT 19 14*  ALKPHOS 191* 136*  BILITOT 1.0 0.8  PROT 7.6 6.5  ALBUMIN 2.9* 2.5*     Lipase     Component Value Date/Time   LIPASE 53 (H) 12/08/2016 1212     Studies/Results: Dg Chest 2 View  Result Date: 12/08/2016 CLINICAL DATA:  Increasing generalized weakness since Friday, per wife pt also has gb stent that broke and is draining everywhere, no other chest complaints EXAM: CHEST  2 VIEW  COMPARISON:  10/17/2016 FINDINGS: There is a linear band of airspace disease in the right middle lobe likely reflecting atelectasis. There is no pleural effusion or pneumothorax. The heart and mediastinal contours are unremarkable. There is a 3 lead cardiac pacemaker. The osseous structures are unremarkable. IMPRESSION: No active cardiopulmonary disease. Electronically Signed   By: Kathreen Devoid   On: 12/08/2016 11:57   Ct Abdomen Pelvis W Contrast  Result Date: 12/08/2016 CLINICAL DATA:  Generalized weakness. History of cholecystostomy tube. EXAM: CT ABDOMEN AND PELVIS WITH CONTRAST TECHNIQUE: Multidetector CT imaging of the abdomen and pelvis was performed using the standard protocol following bolus administration of intravenous contrast. CONTRAST:  121mL ISOVUE-300 IOPAMIDOL (ISOVUE-300) INJECTION 61% COMPARISON:  10/17/2016 FINDINGS: Lower chest:  Bibasilar dependent atelectasis. Hepatobiliary: 13 mm subcapsular lesion lateral segment left liver with hyper perfusion in the surrounding parenchyma. This appears progressed since prior study. 13 mm hypervascular lesion noted in the hepatic dome (image 15 series 2). The cholecystostomy tube visualized with loop formed in the region of the gallbladder neck. Gas in the gallbladder lumen is compatible with the presence of the drain. Insert no biliary Pancreas: 2.1 x 2.5 cm lesion identified in the body the pancreas with markedly atrophic pancreatic tail and dilated duct in the tail the pancreas that terminates at the  level of the mass. Spleen: No splenomegaly. No focal mass lesion. Adrenals/Urinary Tract: No adrenal nodule or mass. Kidneys unremarkable. No evidence for hydroureter. Left bladder wall obscured by streak artifact from hip replacement. Stomach/Bowel: Stomach is nondistended. No gastric wall thickening. No evidence of outlet obstruction. Duodenum is normally positioned as is the ligament of Treitz. No small bowel wall thickening. No small bowel  dilatation. The terminal ileum is normal. The appendix is normal. Irregular wall thickening in the hepatic flexure likely secondary to the gallbladder process. Transverse and descending colon unremarkable. Diverticular changes noted sigmoid colon circumferential wall thickening is seen in the rectum and there is a 1.6 cm rim enhancing structure just posterior to the anal rectal junction. Vascular/Lymphatic: There is abdominal aortic atherosclerosis without aneurysm. There is no gastrohepatic or hepatoduodenal ligament lymphadenopathy. No intraperitoneal or retroperitoneal lymphadenopathy. No pelvic sidewall lymphadenopathy. Reproductive: The prostate gland and seminal vesicles have normal imaging features. Other: No intraperitoneal free fluid. Musculoskeletal: Status post left total hip replacement. Bone windows reveal no worrisome lytic or sclerotic osseous lesions. IMPRESSION: 1. 2.5 cm pancreatic body lesion obstructs the main pancreatic duct in the tail the pancreas generating marked pancreatic tail atrophy. Imaging features highly suspicious for pancreatic neoplasm and adenocarcinoma a distinct concern. 2. Hypervascular liver lesions raise concern for metastatic disease given findings described immediately above. 3. Circumferential wall thickening in the distal rectum with a small rim enhancing focus just posterior to the anal verge. Perirectal abscess could have this appearance. 4. Cholecystostomy tube with irregular gallbladder wall thickening. Pericholecystic stranding is associated with thickening of the colonic wall in the region the hepatic flexure, likely secondary to the primary gallbladder process. 5.  Abdominal Aortic Atherosclerois (ICD10-170.0) Results Were called by me at the time of interpretation on 12/08/2016 at 4:22 pm to Dr. Margarita Mail , who verbally acknowledged these results. Electronically Signed   By: Misty Stanley M.D.   On: 12/08/2016 16:23   Ir Catheter Tube Change  Result Date:  12/09/2016 INDICATION: 71 year old with multiple medical problems including a cholecystostomy tube. Cholecystostomy tube was reportedly leaking. Examination of the cholecystostomy tube demonstrated that the tube had been cut and only a small portion of the tube was sticking out of the skin. Arrangements were made for cholecystostomy tube replacement. EXAM: EXCHANGE OF CHOLECYSTOSTOMY TUBE WITH FLUOROSCOPY MEDICATIONS: None ANESTHESIA/SEDATION: None FLUOROSCOPY TIME:  42 seconds, 40.9 mGy COMPLICATIONS: None immediate. PROCEDURE: Informed written consent was obtained from the patient and wife after a thorough discussion of the procedural risks, benefits and alternatives. All questions were addressed. Maximal Sterile Barrier Technique was utilized including caps, mask, sterile gowns, sterile gloves, sterile drape, hand hygiene and skin antiseptic. A timeout was performed prior to the initiation of the procedure. The remaining cholecystostomy tube and surrounding skin were prepped and draped in a sterile fashion. A Bentson wire was advanced through the tube and into the gallbladder with fluoroscopic guidance. The old tube was removed. A new 10.2 [place] multipurpose drain was easily advanced into the gallbladder with fluoroscopic guidance. Contrast injection confirmed placement in the gallbladder. Skin was anesthetized with 1% lidocaine. Catheter was sutured to the skin. FINDINGS: The old cholecystostomy tube was cut and only a small portion was sticking out of the skin. New tube was positioned in the gallbladder. There is filling of the cystic duct but no filling of the common bile duct. The aspirated bile from the gallbladder is very cloudy and concern for purulent or infected material. Fluid was sent for culture. IMPRESSION: Successful  exchange of the cholecystostomy tube with fluoroscopy. Bile was sent for culture. Electronically Signed   By: Markus Daft M.D.   On: 12/09/2016 09:30   Prior to Admission medications    Medication Sig Start Date End Date Taking? Authorizing Provider  acetaminophen (TYLENOL) 325 MG tablet Take 2 tablets (650 mg total) by mouth every 6 (six) hours as needed for mild pain (or Fever >/= 101). 05/03/16  Yes Maryann Mikhail, DO  atorvastatin (LIPITOR) 10 MG tablet Take 1 tablet (10 mg total) by mouth daily. 01/15/15  Yes Daniel J Angiulli, PA-C  Cholecalciferol (VITAMIN D3) 10000 units TABS Take 1 tablet by mouth daily.   Yes Historical Provider, MD  furosemide (LASIX) 40 MG tablet Take 1 tablet (40 mg total) by mouth daily. 01/02/16  Yes Theodis Blaze, MD  insulin detemir (LEVEMIR) 100 UNIT/ML injection Inject 0.15 mLs (15 Units total) into the skin 2 (two) times daily. 10/23/16  Yes Velvet Bathe, MD  iron polysaccharides (NIFEREX) 150 MG capsule Take 1 capsule (150 mg total) by mouth daily. 01/15/15  Yes Daniel J Angiulli, PA-C  Multiple Vitamin (MULTIVITAMIN WITH MINERALS) TABS tablet Take 1 tablet by mouth daily.   Yes Historical Provider, MD  omeprazole (PRILOSEC) 20 MG capsule Take 20 mg by mouth daily.   Yes Historical Provider, MD  sacubitril-valsartan (ENTRESTO) 24-26 MG Take 1 tablet by mouth 2 (two) times daily. 10/31/15  Yes Deboraha Sprang, MD  spironolactone (ALDACTONE) 25 MG tablet Take 1 tablet (25 mg total) by mouth daily. 11/10/16  Yes Larey Dresser, MD  carvedilol (COREG) 25 MG tablet  12/08/16   Historical Provider, MD  carvedilol (COREG) 6.25 MG tablet Take 1 tablet (6.25 mg total) by mouth 2 (two) times daily with a meal. 10/23/16   Velvet Bathe, MD  metroNIDAZOLE (FLAGYL) 500 MG tablet  12/02/16   Historical Provider, MD  pregabalin (LYRICA) 75 MG capsule Take 1 capsule (75 mg total) by mouth 2 (two) times daily. 01/15/15   Lavon Paganini Angiulli, PA-C  warfarin (COUMADIN) 10 MG tablet Take 1 tablet (10 mg total) by mouth daily. Patient taking differently: Take 10-15 mg by mouth as directed. Take 1 tablet (10 mg) by mouth daily except on Wed Take 1.5 tablets (15 mg). 10/23/16    Velvet Bathe, MD  warfarin (COUMADIN) 7.5 MG tablet Take 7.5 mg by mouth at bedtime. 11/12/16   Historical Provider, MD    Medications: . Chlorhexidine Gluconate Cloth  6 each Topical Q0600  . insulin aspart  0-5 Units Subcutaneous QHS  . insulin aspart  0-9 Units Subcutaneous TID WC  . insulin detemir  15 Units Subcutaneous BID  . mupirocin ointment  1 application Nasal BID  . pantoprazole  40 mg Oral BID  . sacubitril-valsartan  1 tablet Oral BID  . spironolactone  25 mg Oral Daily   . sodium chloride 50 mL/hr at 12/08/16 1731  . piperacillin-tazobactam (ZOSYN)  IV     Assessment/Plan Possible perirectal abscess - I did not see any abscess or sore sites around the buttocks.  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

## 2016-12-09 NOTE — Progress Notes (Addendum)
PROGRESS NOTE                                                                                                                                                                                                             Patient Demographics:    [name] [name], is a 71 y.o. male, DOB - [birth year], MPN:[id number]  Admit date - 12/08/2016   Admitting Physician Albertine Patricia, MD  Outpatient Primary MD for the patient is [name], MD  LOS - 1  Chief Complaint  Patient presents with  . Weakness  . Drainage from Incision    GALLBLADDER STENT      Brief Narrative   71 y.o. male, with medical history significant of NICM status post ICD, CHFwith EF to 20-25% in 2014, DM-2, CKD, PAF on coumadin, bilateral BKA , with recent hospitalization secondary to cholecystitis, where patient discharge with cholecystostomy tube, patient presents with progressive weakness, failure to thrive, workup significant for new pancreatic mass with liver metastases.   Subjective:    [name] [name] today has, No headache, No chest pain, No abdominal pain - No Nausea, Report is feeling better today, reports diarrhea  Assessment  & Plan :    Principal Problem:   Pancreatic mass Active Problems:   NICM- EF 20-25% echo 0/08   SYSTOLIC HEART FAILURE, CHRONIC   HTN (hypertension)   BiV ICD (BS).  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 - Oncology consulted , and further workup depends on which consult. - No nausea, vomiting or abdominal pain, will advance to soft diet.  Addendum: plan for EUS by Dr Paulita Fujita on Thursday or Friday, so will correct INR with 10 on IV vitamin K.  Failure to thrive, progressive weakness - This is most likely related to above,  will consult PT  Cholecystitis - Chronic, status post cholecystostomy, tube was broken and Shawna Orleans, was exchanged by IR on 4/16, 12 material removed from gallbladder, which are so far growing gram-negative rods, will continue with Zosyn  Atrial fibrillation - Currently rate controlled, continue Coreg, will hold warfarin for possible need for procedures or biopsy.  Diarrhea - Check C. difficile, will start on probiotics  Bilateral renal knee amputation - We'll consult PT, has has prosthesis  Abormal finding in CT imaging of the rectal area - Gen. surgery input greatly appreciated, no evidence of perirectal abscess.  Anemia of chronic disease - At baseline, continue to monitor  CK D stage III - Creatinine at baseline, continue to monitor  Diabetes mellitus - Uncontrolled on insulin sliding scale and home dose Levemir, with increased from 15- 25 units twice a day.  Chronic Systolic CHF with Cardiomyopathy with EF 20-25% status post AICD placement -  continue with coreg ,Aldactone and Entresto, no evidence of volume overload, will stop IV fluid, continue to hold Lasix.  Hyperlipidemia - Resume statin when stable  Hypertension - Blood pressure acceptable, continue with Entresto,and coreg    Code Status : full  Family Communication  : none at bedside  Disposition Plan  : Pending further work up.  Consults  :  GI, General syrgery, oncology, IR  Procedures  : none  DVT Prophylaxis  :  INR 2.34 today - SCDs   Lab Results  Component Value Date   PLT 248 12/09/2016    Antibiotics  :     Anti-infectives    Start     Dose/Rate Route Frequency Ordered Stop   12/09/16 0000  piperacillin-tazobactam (ZOSYN) IVPB 3.375 g     3.375 g 12.5 mL/hr over 240 Minutes Intravenous Every 8 hours 12/08/16 1808     12/08/16 1730  piperacillin-tazobactam (ZOSYN) IVPB 3.375 g     3.375 g 100 mL/hr over 30 Minutes Intravenous STAT 12/08/16 1722 12/08/16 1810        Objective:   Vitals:   12/08/16 1723 12/08/16 1900 12/08/16 2048 12/09/16 0455  BP: 119/77 136/73 129/70 128/84  Pulse: 100 (!) 101 98 83  Resp: 20 20 18 18   Temp: 99 F (37.2 C) 98.5 F (36.9 C) 98 F (36.7 C) 98.4 F (36.9 C)  TempSrc: Oral Oral Oral Oral  SpO2: 95% 99% 98% 100%  Weight:      Height:        Wt Readings from Last 3 Encounters:  12/08/16 95.3 kg (210 lb)  11/07/16 97.1 kg (214 lb)  10/23/16 91.7 kg (202 lb 1.6 oz)     Intake/Output Summary (Last 24 hours) at 12/09/16 1432 Last data filed at 12/09/16 1309  Gross per 24 hour  Intake             1080 ml  Output             1300 ml  Net             -220 ml     Physical Exam  Awake Alert, Oriented X 3 Symmetrical Chest wall movement, Good air movement bilaterally, CTAB IRR IRR ,No Gallops,Rubs or new Murmurs, No Parasternal Heave +ve B.Sounds, Abd Soft, No cholecystostomy tube site with good drainage and no discharge , mild tenderness in epigastric area No rebound - guarding or rigidity. Bilateral BKA with prosthesis    Data Review:    CBC  Recent Labs Lab 12/08/16 1212 12/09/16 0351  WBC  8.8 6.4  HGB 11.2* 9.7*  HCT 33.9* 29.5*  PLT 250 248  MCV 72.4* 72.1*  MCH 23.9* 23.7*  MCHC 33.0 32.9  RDW 18.4* 18.3*  LYMPHSABS 0.6*  --   MONOABS 0.8  --   EOSABS 0.1  --   BASOSABS 0.0  --     Chemistries   Recent Labs Lab 12/08/16 1212 12/09/16 0351  NA 140 140  K 4.1 3.6  CL 106 108  CO2 22 24  GLUCOSE 316* 320*  BUN 30* 25*  CREATININE 1.40* 1.36*  CALCIUM 8.9 8.3*  AST 14* 10*  ALT 19 14*  ALKPHOS 191* 136*   BILITOT 1.0 0.8   ------------------------------------------------------------------------------------------------------------------ No results for input(s): CHOL, HDL, LDLCALC, TRIG, CHOLHDL, LDLDIRECT in the last 72 hours.  Lab Results  Component Value Date   HGBA1C 11.0 (H) 10/15/2016   ------------------------------------------------------------------------------------------------------------------ No results for input(s): TSH, T4TOTAL, T3FREE, THYROIDAB in the last 72 hours.  Invalid input(s): FREET3 ------------------------------------------------------------------------------------------------------------------ No results for input(s): VITAMINB12, FOLATE, FERRITIN, TIBC, IRON, RETICCTPCT in the last 72 hours.  Coagulation profile  Recent Labs Lab 12/08/16 1212 12/09/16 1020  INR 1.84 2.34    No results for input(s): DDIMER in the last 72 hours.  Cardiac Enzymes No results for input(s): CKMB, TROPONINI, MYOGLOBIN in the last 168 hours.  Invalid input(s): CK ------------------------------------------------------------------------------------------------------------------    Component Value Date/Time   BNP 866.4 (H) 11/10/2016 1101    Inpatient Medications  Scheduled Meds: . Chlorhexidine Gluconate Cloth  6 each Topical Q0600  . insulin aspart  0-5 Units Subcutaneous QHS  . insulin aspart  0-9 Units Subcutaneous TID WC  . insulin detemir  15 Units Subcutaneous BID  . mupirocin ointment  1 application Nasal BID  . pantoprazole  40 mg Oral BID  . sacubitril-valsartan  1 tablet Oral BID  . spironolactone  25 mg Oral Daily   Continuous Infusions: . sodium chloride 50 mL/hr at 12/09/16 1222  . piperacillin-tazobactam (ZOSYN)  IV     PRN Meds:.acetaminophen **OR** acetaminophen, albuterol, alum & mag hydroxide-simeth, HYDROcodone-acetaminophen, iopamidol, lidocaine  Micro Results Recent Results (from the past 240 hour(s))  Body fluid culture     Status: None  (Preliminary result)   Collection Time: 12/08/16  6:24 PM  Result Value Ref Range Status   Specimen Description BILE  Final   Special Requests NONE  Final   Gram Stain   Final    ABUNDANT WBC PRESENT,BOTH PMN AND MONONUCLEAR MODERATE GRAM NEGATIVE RODS    Culture   Final    CULTURE REINCUBATED FOR BETTER GROWTH Performed at [facility], [address], [place], [place] [address]    Report Status PENDING  Incomplete  MRSA PCR Screening     Status: Abnormal   Collection Time: 12/08/16  6:51 PM  Result Value Ref Range Status   MRSA by PCR POSITIVE (A) NEGATIVE Final    Comment:        The GeneXpert MRSA Assay (FDA approved for NASAL specimens only), is one component of a comprehensive MRSA colonization surveillance program. It is not intended to diagnose MRSA infection nor to guide or monitor treatment for MRSA infections. CRITICAL RESULT CALLED TO, READ BACK BY AND VERIFIED WITH: B CHERRY RN [id number] 12/09/16 A NAVARRO     Radiology Reports Dg Chest 2 View  Result Date: 12/08/2016 CLINICAL DATA:  Increasing generalized weakness since Friday, per wife pt also has gb stent that broke and is draining everywhere, no other chest complaints  EXAM: CHEST  2 VIEW COMPARISON:  10/17/2016 FINDINGS: There is a linear band of airspace disease in the right middle lobe likely reflecting atelectasis. There is no pleural effusion or pneumothorax. The heart and mediastinal contours are unremarkable. There is a 3 lead cardiac pacemaker. The osseous structures are unremarkable. IMPRESSION: No active cardiopulmonary disease. Electronically Signed   By: Kathreen Devoid   On: 12/08/2016 11:57   Ct Abdomen Pelvis W Contrast  Result Date: 12/08/2016 CLINICAL DATA:  Generalized weakness. History of cholecystostomy tube. EXAM: CT ABDOMEN AND PELVIS WITH CONTRAST TECHNIQUE: Multidetector CT imaging of the abdomen and pelvis was performed using the standard protocol following bolus administration of  intravenous contrast. CONTRAST:  179mL ISOVUE-300 IOPAMIDOL (ISOVUE-300) INJECTION 61% COMPARISON:  10/17/2016 FINDINGS: Lower chest:  Bibasilar dependent atelectasis. Hepatobiliary: 13 mm subcapsular lesion lateral segment left liver with hyper perfusion in the surrounding parenchyma. This appears progressed since prior study. 13 mm hypervascular lesion noted in the hepatic dome (image 15 series 2). The cholecystostomy tube visualized with loop formed in the region of the gallbladder neck. Gas in the gallbladder lumen is compatible with the presence of the drain. Insert no biliary Pancreas: 2.1 x 2.5 cm lesion identified in the body the pancreas with markedly atrophic pancreatic tail and dilated duct in the tail the pancreas that terminates at the level of the mass. Spleen: No splenomegaly. No focal mass lesion. Adrenals/Urinary Tract: No adrenal nodule or mass. Kidneys unremarkable. No evidence for hydroureter. Left bladder wall obscured by streak artifact from hip replacement. Stomach/Bowel: Stomach is nondistended. No gastric wall thickening. No evidence of outlet obstruction. Duodenum is normally positioned as is the ligament of Treitz. No small bowel wall thickening. No small bowel dilatation. The terminal ileum is normal. The appendix is normal. Irregular wall thickening in the hepatic flexure likely secondary to the gallbladder process. Transverse and descending colon unremarkable. Diverticular changes noted sigmoid colon circumferential wall thickening is seen in the rectum and there is a 1.6 cm rim enhancing structure just posterior to the anal rectal junction. Vascular/Lymphatic: There is abdominal aortic atherosclerosis without aneurysm. There is no gastrohepatic or hepatoduodenal ligament lymphadenopathy. No intraperitoneal or retroperitoneal lymphadenopathy. No pelvic sidewall lymphadenopathy. Reproductive: The prostate gland and seminal vesicles have normal imaging features. Other: No intraperitoneal  free fluid. Musculoskeletal: Status post left total hip replacement. Bone windows reveal no worrisome lytic or sclerotic osseous lesions. IMPRESSION: 1. 2.5 cm pancreatic body lesion obstructs the main pancreatic duct in the tail the pancreas generating marked pancreatic tail atrophy. Imaging features highly suspicious for pancreatic neoplasm and adenocarcinoma a distinct concern. 2. Hypervascular liver lesions raise concern for metastatic disease given findings described immediately above. 3. Circumferential wall thickening in the distal rectum with a small rim enhancing focus just posterior to the anal verge. Perirectal abscess could have this appearance. 4. Cholecystostomy tube with irregular gallbladder wall thickening. Pericholecystic stranding is associated with thickening of the colonic wall in the region the hepatic flexure, likely secondary to the primary gallbladder process. 5.  Abdominal Aortic Atherosclerois (ICD10-170.0) Results Were called by me at the time of interpretation on 12/08/2016 at 4:22 pm to Dr. Margarita Mail , who verbally acknowledged these results. Electronically Signed   By: Misty Stanley M.D.   On: 12/08/2016 16:23   Ir Catheter Tube Change  Result Date: 12/09/2016 INDICATION: 71 year old with multiple medical problems including a cholecystostomy tube. Cholecystostomy tube was reportedly leaking. Examination of the cholecystostomy tube demonstrated that the tube had been cut and only  a small portion of the tube was sticking out of the skin. Arrangements were made for cholecystostomy tube replacement. EXAM: EXCHANGE OF CHOLECYSTOSTOMY TUBE WITH FLUOROSCOPY MEDICATIONS: None ANESTHESIA/SEDATION: None FLUOROSCOPY TIME:  42 seconds, 57.3 mGy COMPLICATIONS: None immediate. PROCEDURE: Informed written consent was obtained from the patient and wife after a thorough discussion of the procedural risks, benefits and alternatives. All questions were addressed. Maximal Sterile Barrier Technique  was utilized including caps, mask, sterile gowns, sterile gloves, sterile drape, hand hygiene and skin antiseptic. A timeout was performed prior to the initiation of the procedure. The remaining cholecystostomy tube and surrounding skin were prepped and draped in a sterile fashion. A Bentson wire was advanced through the tube and into the gallbladder with fluoroscopic guidance. The old tube was removed. A new 10.2 [place] multipurpose drain was easily advanced into the gallbladder with fluoroscopic guidance. Contrast injection confirmed placement in the gallbladder. Skin was anesthetized with 1% lidocaine. Catheter was sutured to the skin. FINDINGS: The old cholecystostomy tube was cut and only a small portion was sticking out of the skin. New tube was positioned in the gallbladder. There is filling of the cystic duct but no filling of the common bile duct. The aspirated bile from the gallbladder is very cloudy and concern for purulent or infected material. Fluid was sent for culture. IMPRESSION: Successful exchange of the cholecystostomy tube with fluoroscopy. Bile was sent for culture. Electronically Signed   By: Markus Daft M.D.   On: 12/09/2016 09:30   Dg Cholangiogram  Existing Tube  Addendum Date: 11/25/2016   ADDENDUM REPORT: 11/25/2016 11:27 ADDENDUM: The cystic duct appears OCCLUDED approximately 2 cm from the neck of the gallbladder. There is no contrast into the CBD or small bowel. Findings were reviewed with Dr. Hulen Skains. Electronically Signed   By: Lucrezia Europe M.D.   On: 11/25/2016 11:27   Result Date: 11/25/2016 INDICATION: Cholecystostomy tube check EXAM: CHOLECYSTOSTOMY TUBE CHECK MEDICATIONS: 18 cc Omnipaque 300 ANESTHESIA/SEDATION: None FLUOROSCOPY TIME:  Fluoroscopy Time: minutes 36 seconds (124 mGy mGy). COMPLICATIONS: None immediate. PROCEDURE: Informed written consent was obtained from the patient after a thorough discussion of the procedural risks, benefits and alternatives. All questions were  addressed. Maximal Sterile Barrier Technique was utilized including caps, mask, sterile gowns, sterile gloves, sterile drape, hand hygiene and skin antiseptic. A timeout was performed prior to the initiation of the procedure. Contrast was injected into the cholecystostomy tube and images were obtained. FINDINGS: The cystic duct remains patent. He remains coiled in the gallbladder lumen. IMPRESSION: The cystic duct remains patent. The cholecystostomy tube remains in place. Follow-up in 6-8 weeks for repeat injection. Electronically Signed: By: Marybelle Killings M.D. On: 11/20/2016 12:58     Tajia Szeliga M.D on 12/09/2016 at 2:32 PM  Between 7am to 7pm - Pager - [phone]  After 7pm go to [email or web address] - password [facility]  [facility] -  Office  [phone]

## 2016-12-09 NOTE — Consult Note (Signed)
[name] [name]   DOB:1946/06/15   JZ#:[id number]   XYI#:[id number]  Subjective: [name] [name] is a 71 year old man who is being seen as an oncology consultation for pancreatic mass with liver metastases.  He underwent CT abdomen and pelvis on 4/16 and was found to have 2.5 cm pancreatic lesion concerning for adenocarcinoma with hypervascular liver lesions concerning metastatic disease.  He was seen by Dr. Michail Sermon GI consultant who stated that if the patient is a candidate for chemotherapy, then would recommend biopsy of one of the liver lesions, however if not a candidate for chemotherapy, would recommend palliative care.    [name] [name] has several comorbidities including: NICM s/p ICD placement, CHF with latest LVEF in 09/2016 being 20-25%, CKD, paroxysmal a fib--anticoagulated on coumadin, bilateral BKAs, controlled diabetes.  He tells me that he lives at home with his wife and uses a walker and bka prostheses to get around.  He toilets independently, dresses with help, can sponge bathe independently though his wife dose help with that from time to time.  His wife does do the cooking and cleaning.    Today, [name] [name] says that he is having some occasional diarrhea.  He also endorses some weight loss recently, but cannot specify an amount.  He has some mid abdominal pain about 8/10 that is a shooting pain x 1 week.  This pain is intermittent with no specific pattern.  He did previously have some nausea and vomiting, but that has resolved.     Objective:  Vitals:   12/09/16 0455 12/09/16 1437  BP: 128/84 (!) 116/59  Pulse: 83 96  Resp: 18 18  Temp: 98.4 F (36.9 C) 98.2 F (36.8 C)    Body mass index is 31.01 kg/m.  Intake/Output Summary (Last 24 hours) at 12/09/16 1632 Last data filed at 12/09/16 1626  Gross per 24 hour  Intake             1130 ml  Output             1550 ml  Net             -420 ml    GENERAL: Patient is a chronically ill older male lying in bed in no acute  distress HEENT:  Sclerae anicteric.  Oropharynx clear and moist. No ulcerations or evidence of oropharyngeal candidiasis. Neck is supple.  LUNGS:  Clear to auscultation bilaterally.  No wheezes or rhonchi. HEART:  Regular rate and rhythm. No murmur appreciated. ABDOMEN:  Soft, nontender.  Positive, normoactive bowel sounds. No organomegaly palpated. MSK:  No focal spinal tenderness to palpation. Full range of motion bilaterally in the upper extremities. EXTREMITIES:  No peripheral edema.  s/p bka bilateraller SKIN:  Clear with no obvious rashes or skin changes.  NEURO:  Nonfocal. Well oriented.  Appropriate affect.    CBG (last 3)   Recent Labs  12/09/16 0814 12/09/16 1249 12/09/16 1623  GLUCAP 273* 356* 239*     Labs:  Lab Results  Component Value Date   WBC 6.4 12/09/2016   HGB 9.7 (L) 12/09/2016   HCT 29.5 (L) 12/09/2016   MCV 72.1 (L) 12/09/2016   PLT 248 12/09/2016   NEUTROABS 7.3 12/08/2016    @LASTCHEMISTRY @  Urine Studies No results for input(s): UHGB, CRYS in the last 72 hours.  Invalid input(s): UACOL, UAPR, USPG, UPH, UTP, UGL, UKET, UBIL, UNIT, UROB, ULEU, UEPI, UWBC, URBC, UBAC, CAST, [place], [place]  Basic Metabolic Panel:  Recent Labs Lab 12/08/16 1212 12/09/16 0351  NA 140 140  K 4.1 3.6  CL 106 108  CO2 22 24  GLUCOSE 316* 320*  BUN 30* 25*  CREATININE 1.40* 1.36*  CALCIUM 8.9 8.3*   GFR Estimated Creatinine Clearance: 55.1 mL/min (A) (by C-G formula based on SCr of 1.36 mg/dL (H)). Liver Function Tests:  Recent Labs Lab 12/08/16 1212 12/09/16 0351  AST 14* 10*  ALT 19 14*  ALKPHOS 191* 136*  BILITOT 1.0 0.8  PROT 7.6 6.5  ALBUMIN 2.9* 2.5*    Recent Labs Lab 12/08/16 1212  LIPASE 53*   No results for input(s): AMMONIA in the last 168 hours. Coagulation profile  Recent Labs Lab 12/08/16 1212 12/09/16 1020  INR 1.84 2.34    CBC:  Recent Labs Lab 12/08/16 1212 12/09/16 0351  WBC 8.8 6.4  NEUTROABS 7.3  --   HGB  11.2* 9.7*  HCT 33.9* 29.5*  MCV 72.4* 72.1*  PLT 250 248   CBG:  Recent Labs Lab 12/08/16 1942 12/09/16 0814 12/09/16 1249 12/09/16 1623  GLUCAP 341* 273* 356* 239*     Microbiology Recent Results (from the past 240 hour(s))  Body fluid culture     Status: None (Preliminary result)   Collection Time: 12/08/16  6:24 PM  Result Value Ref Range Status   Specimen Description BILE  Final   Special Requests NONE  Final   Gram Stain   Final    ABUNDANT WBC PRESENT,BOTH PMN AND MONONUCLEAR MODERATE GRAM NEGATIVE RODS    Culture   Final    CULTURE REINCUBATED FOR BETTER GROWTH Performed at [facility], [place] [address], [place], [place] [address]    Report Status PENDING  Incomplete  MRSA PCR Screening     Status: Abnormal   Collection Time: 12/08/16  6:51 PM  Result Value Ref Range Status   MRSA by PCR POSITIVE (A) NEGATIVE Final    Comment:        The GeneXpert MRSA Assay (FDA approved for NASAL specimens only), is one component of a comprehensive MRSA colonization surveillance program. It is not intended to diagnose MRSA infection nor to guide or monitor treatment for MRSA infections. CRITICAL RESULT CALLED TO, READ BACK BY AND VERIFIED WITH: B CHERRY RN [id number] 12/09/16 A NAVARRO       Studies:  Dg Chest 2 View  Result Date: 12/08/2016 CLINICAL DATA:  Increasing generalized weakness since Friday, per wife pt also has gb stent that broke and is draining everywhere, no other chest complaints EXAM: CHEST  2 VIEW COMPARISON:  10/17/2016 FINDINGS: There is a linear band of airspace disease in the right middle lobe likely reflecting atelectasis. There is no pleural effusion or pneumothorax. The heart and mediastinal contours are unremarkable. There is a 3 lead cardiac pacemaker. The osseous structures are unremarkable. IMPRESSION: No active cardiopulmonary disease. Electronically Signed   By: Kathreen Devoid   On: 12/08/2016 11:57   Ct Abdomen Pelvis W  Contrast  Result Date: 12/08/2016 CLINICAL DATA:  Generalized weakness. History of cholecystostomy tube. EXAM: CT ABDOMEN AND PELVIS WITH CONTRAST TECHNIQUE: Multidetector CT imaging of the abdomen and pelvis was performed using the standard protocol following bolus administration of intravenous contrast. CONTRAST:  165mL ISOVUE-300 IOPAMIDOL (ISOVUE-300) INJECTION 61% COMPARISON:  10/17/2016 FINDINGS: Lower chest:  Bibasilar dependent atelectasis. Hepatobiliary: 13 mm subcapsular lesion lateral segment left liver with hyper perfusion in the surrounding parenchyma. This appears progressed since prior study. 13 mm hypervascular lesion noted in the hepatic dome (image 15 series 2).  The cholecystostomy tube visualized with loop formed in the region of the gallbladder neck. Gas in the gallbladder lumen is compatible with the presence of the drain. Insert no biliary Pancreas: 2.1 x 2.5 cm lesion identified in the body the pancreas with markedly atrophic pancreatic tail and dilated duct in the tail the pancreas that terminates at the level of the mass. Spleen: No splenomegaly. No focal mass lesion. Adrenals/Urinary Tract: No adrenal nodule or mass. Kidneys unremarkable. No evidence for hydroureter. Left bladder wall obscured by streak artifact from hip replacement. Stomach/Bowel: Stomach is nondistended. No gastric wall thickening. No evidence of outlet obstruction. Duodenum is normally positioned as is the ligament of Treitz. No small bowel wall thickening. No small bowel dilatation. The terminal ileum is normal. The appendix is normal. Irregular wall thickening in the hepatic flexure likely secondary to the gallbladder process. Transverse and descending colon unremarkable. Diverticular changes noted sigmoid colon circumferential wall thickening is seen in the rectum and there is a 1.6 cm rim enhancing structure just posterior to the anal rectal junction. Vascular/Lymphatic: There is abdominal aortic atherosclerosis  without aneurysm. There is no gastrohepatic or hepatoduodenal ligament lymphadenopathy. No intraperitoneal or retroperitoneal lymphadenopathy. No pelvic sidewall lymphadenopathy. Reproductive: The prostate gland and seminal vesicles have normal imaging features. Other: No intraperitoneal free fluid. Musculoskeletal: Status post left total hip replacement. Bone windows reveal no worrisome lytic or sclerotic osseous lesions. IMPRESSION: 1. 2.5 cm pancreatic body lesion obstructs the main pancreatic duct in the tail the pancreas generating marked pancreatic tail atrophy. Imaging features highly suspicious for pancreatic neoplasm and adenocarcinoma a distinct concern. 2. Hypervascular liver lesions raise concern for metastatic disease given findings described immediately above. 3. Circumferential wall thickening in the distal rectum with a small rim enhancing focus just posterior to the anal verge. Perirectal abscess could have this appearance. 4. Cholecystostomy tube with irregular gallbladder wall thickening. Pericholecystic stranding is associated with thickening of the colonic wall in the region the hepatic flexure, likely secondary to the primary gallbladder process. 5.  Abdominal Aortic Atherosclerois (ICD10-170.0) Results Were called by me at the time of interpretation on 12/08/2016 at 4:22 pm to Dr. Margarita Mail , who verbally acknowledged these results. Electronically Signed   By: Misty Stanley M.D.   On: 12/08/2016 16:23   Ir Catheter Tube Change  Result Date: 12/09/2016 INDICATION: 71 year old with multiple medical problems including a cholecystostomy tube. Cholecystostomy tube was reportedly leaking. Examination of the cholecystostomy tube demonstrated that the tube had been cut and only a small portion of the tube was sticking out of the skin. Arrangements were made for cholecystostomy tube replacement. EXAM: EXCHANGE OF CHOLECYSTOSTOMY TUBE WITH FLUOROSCOPY MEDICATIONS: None ANESTHESIA/SEDATION: None  FLUOROSCOPY TIME:  42 seconds, 11.9 mGy COMPLICATIONS: None immediate. PROCEDURE: Informed written consent was obtained from the patient and wife after a thorough discussion of the procedural risks, benefits and alternatives. All questions were addressed. Maximal Sterile Barrier Technique was utilized including caps, mask, sterile gowns, sterile gloves, sterile drape, hand hygiene and skin antiseptic. A timeout was performed prior to the initiation of the procedure. The remaining cholecystostomy tube and surrounding skin were prepped and draped in a sterile fashion. A Bentson wire was advanced through the tube and into the gallbladder with fluoroscopic guidance. The old tube was removed. A new 10.2 [place] multipurpose drain was easily advanced into the gallbladder with fluoroscopic guidance. Contrast injection confirmed placement in the gallbladder. Skin was anesthetized with 1% lidocaine. Catheter was sutured to the skin. FINDINGS: The old cholecystostomy  tube was cut and only a small portion was sticking out of the skin. New tube was positioned in the gallbladder. There is filling of the cystic duct but no filling of the common bile duct. The aspirated bile from the gallbladder is very cloudy and concern for purulent or infected material. Fluid was sent for culture. IMPRESSION: Successful exchange of the cholecystostomy tube with fluoroscopy. Bile was sent for culture. Electronically Signed   By: Markus Daft M.D.   On: 12/09/2016 09:30    Assessment/Plan: 71 y.o. with concern for pancreatic mass with liver lesions on CT scan.  Prognosis for likely pancreatic adenocarcinoma poor, and this was relayed to patient.  He will meet with Dr. Lindi Adie for further planning and discussion of this.     Scot Dock, NP 12/09/2016  4:32 PM Medical Oncology and Hematology [facility] [address] [place], [place] [address] Tel. [phone]    Fax. [phone]   ADDENDUM Attending Note  I  personally saw the patient, reviewed the chart and examined the patient. The plan of care was discussed with the patient and his wife on phone. I agree with the assessment and plan as documented above. 1. Pancreatic mass with liver lesions: highly suspicious for pancreatic cancer with mets. I discussed with the patient and his wife regarding prognosis of pancreatic cancer. I also discussed the different chemotherapy treatments for metastatic pancreatic cancer. The family wishes to pursue a biopsy most importantly for prognosis consideration and to prove that he does have pancreatic cancer. He is unlikely to receive systemic chemotherapy given his extensive comorbidities and his general health status.  2. based on the patient and his family's request, it is reasonable to obtain a liver biopsy to confirm his diagnosis. 3. Will obtain CA 19-9. I will follow up with the patient after we have a tissue diagnosis. If necessary we can arrange an outpatient follow-up to discuss this further. Palliative care consultation is appropriate if proven to be metastatic pancreatic cancer.

## 2016-12-09 NOTE — Progress Notes (Signed)
Initial Nutrition Assessment  DOCUMENTATION CODES:   Obesity unspecified  INTERVENTION:   Diet advancement per MD Will discuss nutrition supplements once diet is advanced.  NUTRITION DIAGNOSIS:   Increased nutrient needs related to cancer and cancer related treatments as evidenced by estimated needs.  GOAL:   Patient will meet greater than or equal to 90% of their needs  MONITOR:   Diet advancement, Labs, Weight trends, I & O's (GOC)  REASON FOR ASSESSMENT:   Malnutrition Screening Tool    ASSESSMENT:    71 y.o. 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 He is s/p a cholecystostomy tube in February that was replaced yesterday. He denies abdominal pain, nausea, and vomiting. Has been losing weight but unable to tell me how much. ALP mildly elevated at 136 and other liver enzymes are nromal. On chronic Coumadin. S/P bilateral BKA.   Patient in room with staff. Staff is cleaning and assessing patient at time of visit.  Pt currently consuming 100% of clear liquids at this time. Given pt's new pancreatic mass with mets, nutrition needs are increased. Will discuss nutrition supplements once diet is advanced further.  Per H&P, pt's wife reports pt has had poor appetite and not eating well over the weekend.  Pt's weight is increasing. Pt obese with bilateral BKAs. Unable to perform NFPE at this time.  Medications reviewed. Labs reviewed: CBGs: 273-356  Diet Order:  Diet clear liquid Room service appropriate? Yes; Fluid consistency: Thin  Skin:  Reviewed, no issues  Last BM:  4/17  Height:   Ht Readings from Last 1 Encounters:  12/08/16 5\' 9"  (1.753 m)    Weight:   Wt Readings from Last 1 Encounters:  12/08/16 210 lb (95.3 kg)    Ideal Body Weight:  64 kg (adjusted for bilateral BKA)  BMI:  Body mass index is 31.01 kg/m.  Estimated Nutritional  Needs:   Kcal:  1900-2100  Protein:  90-100g  Fluid:  2L/day  EDUCATION NEEDS:   No education needs identified at this time  Clayton Bibles, MS, RD, LDN Pager: [phone] After Hours Pager: [phone]

## 2016-12-09 NOTE — Progress Notes (Signed)
Referring Physician(s): Martin,M  Supervising Physician: Corrie Mckusick  Patient Status:  [facility] - In-pt  Chief Complaint: Recent cholecystitis, weakness, leaking gallbladder drain   Subjective: Pt doing ok today; has some soreness at GB drain site, still weak ; denies N/V; appetite not great   Allergies: Patient has no known allergies.  Medications: Prior to Admission medications   Medication Sig Start Date End Date Taking? Authorizing Provider  acetaminophen (TYLENOL) 325 MG tablet Take 2 tablets (650 mg total) by mouth every 6 (six) hours as needed for mild pain (or Fever >/= 101). 05/03/16  Yes Maryann Mikhail, DO  atorvastatin (LIPITOR) 10 MG tablet Take 1 tablet (10 mg total) by mouth daily. 01/15/15  Yes Daniel J Angiulli, PA-C  Cholecalciferol (VITAMIN D3) 10000 units TABS Take 1 tablet by mouth daily.   Yes Historical Provider, MD  furosemide (LASIX) 40 MG tablet Take 1 tablet (40 mg total) by mouth daily. 01/02/16  Yes Theodis Blaze, MD  insulin detemir (LEVEMIR) 100 UNIT/ML injection Inject 0.15 mLs (15 Units total) into the skin 2 (two) times daily. 10/23/16  Yes Velvet Bathe, MD  iron polysaccharides (NIFEREX) 150 MG capsule Take 1 capsule (150 mg total) by mouth daily. 01/15/15  Yes Daniel J Angiulli, PA-C  Multiple Vitamin (MULTIVITAMIN WITH MINERALS) TABS tablet Take 1 tablet by mouth daily.   Yes Historical Provider, MD  omeprazole (PRILOSEC) 20 MG capsule Take 20 mg by mouth daily.   Yes Historical Provider, MD  sacubitril-valsartan (ENTRESTO) 24-26 MG Take 1 tablet by mouth 2 (two) times daily. 10/31/15  Yes Deboraha Sprang, MD  spironolactone (ALDACTONE) 25 MG tablet Take 1 tablet (25 mg total) by mouth daily. 11/10/16  Yes Larey Dresser, MD  carvedilol (COREG) 25 MG tablet  12/08/16   Historical Provider, MD  carvedilol (COREG) 6.25 MG tablet Take 1 tablet (6.25 mg total) by mouth 2 (two) times daily with a meal. 10/23/16   Velvet Bathe, MD  metroNIDAZOLE (FLAGYL) 500 MG  tablet  12/02/16   Historical Provider, MD  pregabalin (LYRICA) 75 MG capsule Take 1 capsule (75 mg total) by mouth 2 (two) times daily. 01/15/15   Lavon Paganini Angiulli, PA-C  warfarin (COUMADIN) 10 MG tablet Take 1 tablet (10 mg total) by mouth daily. Patient taking differently: Take 10-15 mg by mouth as directed. Take 1 tablet (10 mg) by mouth daily except on Wed Take 1.5 tablets (15 mg). 10/23/16   Velvet Bathe, MD  warfarin (COUMADIN) 7.5 MG tablet Take 7.5 mg by mouth at bedtime. 11/12/16   Historical Provider, MD     Vital Signs: BP 128/84 (BP Location: Left Arm)   Pulse 83   Temp 98.4 F (36.9 C) (Oral)   Resp 18   Ht 5\' 9"  (1.753 m)   Wt 210 lb (95.3 kg)   SpO2 100%   BMI 31.01 kg/m   Physical Exam GB drain intact, dressing currently dry, site mildly tender, output about 40 cc in bag now turbid, green bile; drain irrigated with sterile NS without difficulty  Imaging: Dg Chest 2 View  Result Date: 12/08/2016 CLINICAL DATA:  Increasing generalized weakness since Friday, per wife pt also has gb stent that broke and is draining everywhere, no other chest complaints EXAM: CHEST  2 VIEW COMPARISON:  10/17/2016 FINDINGS: There is a linear band of airspace disease in the right middle lobe likely reflecting atelectasis. There is no pleural effusion or pneumothorax. The heart and mediastinal contours are unremarkable. There  is a 3 lead cardiac pacemaker. The osseous structures are unremarkable. IMPRESSION: No active cardiopulmonary disease. Electronically Signed   By: Kathreen Devoid   On: 12/08/2016 11:57   Ct Abdomen Pelvis W Contrast  Result Date: 12/08/2016 CLINICAL DATA:  Generalized weakness. History of cholecystostomy tube. EXAM: CT ABDOMEN AND PELVIS WITH CONTRAST TECHNIQUE: Multidetector CT imaging of the abdomen and pelvis was performed using the standard protocol following bolus administration of intravenous contrast. CONTRAST:  173mL ISOVUE-300 IOPAMIDOL (ISOVUE-300) INJECTION 61%  COMPARISON:  10/17/2016 FINDINGS: Lower chest:  Bibasilar dependent atelectasis. Hepatobiliary: 13 mm subcapsular lesion lateral segment left liver with hyper perfusion in the surrounding parenchyma. This appears progressed since prior study. 13 mm hypervascular lesion noted in the hepatic dome (image 15 series 2). The cholecystostomy tube visualized with loop formed in the region of the gallbladder neck. Gas in the gallbladder lumen is compatible with the presence of the drain. Insert no biliary Pancreas: 2.1 x 2.5 cm lesion identified in the body the pancreas with markedly atrophic pancreatic tail and dilated duct in the tail the pancreas that terminates at the level of the mass. Spleen: No splenomegaly. No focal mass lesion. Adrenals/Urinary Tract: No adrenal nodule or mass. Kidneys unremarkable. No evidence for hydroureter. Left bladder wall obscured by streak artifact from hip replacement. Stomach/Bowel: Stomach is nondistended. No gastric wall thickening. No evidence of outlet obstruction. Duodenum is normally positioned as is the ligament of Treitz. No small bowel wall thickening. No small bowel dilatation. The terminal ileum is normal. The appendix is normal. Irregular wall thickening in the hepatic flexure likely secondary to the gallbladder process. Transverse and descending colon unremarkable. Diverticular changes noted sigmoid colon circumferential wall thickening is seen in the rectum and there is a 1.6 cm rim enhancing structure just posterior to the anal rectal junction. Vascular/Lymphatic: There is abdominal aortic atherosclerosis without aneurysm. There is no gastrohepatic or hepatoduodenal ligament lymphadenopathy. No intraperitoneal or retroperitoneal lymphadenopathy. No pelvic sidewall lymphadenopathy. Reproductive: The prostate gland and seminal vesicles have normal imaging features. Other: No intraperitoneal free fluid. Musculoskeletal: Status post left total hip replacement. Bone windows  reveal no worrisome lytic or sclerotic osseous lesions. IMPRESSION: 1. 2.5 cm pancreatic body lesion obstructs the main pancreatic duct in the tail the pancreas generating marked pancreatic tail atrophy. Imaging features highly suspicious for pancreatic neoplasm and adenocarcinoma a distinct concern. 2. Hypervascular liver lesions raise concern for metastatic disease given findings described immediately above. 3. Circumferential wall thickening in the distal rectum with a small rim enhancing focus just posterior to the anal verge. Perirectal abscess could have this appearance. 4. Cholecystostomy tube with irregular gallbladder wall thickening. Pericholecystic stranding is associated with thickening of the colonic wall in the region the hepatic flexure, likely secondary to the primary gallbladder process. 5.  Abdominal Aortic Atherosclerois (ICD10-170.0) Results Were called by me at the time of interpretation on 12/08/2016 at 4:22 pm to Dr. Margarita Mail , who verbally acknowledged these results. Electronically Signed   By: Misty Stanley M.D.   On: 12/08/2016 16:23   Ir Catheter Tube Change  Result Date: 12/09/2016 INDICATION: 71 year old with multiple medical problems including a cholecystostomy tube. Cholecystostomy tube was reportedly leaking. Examination of the cholecystostomy tube demonstrated that the tube had been cut and only a small portion of the tube was sticking out of the skin. Arrangements were made for cholecystostomy tube replacement. EXAM: EXCHANGE OF CHOLECYSTOSTOMY TUBE WITH FLUOROSCOPY MEDICATIONS: None ANESTHESIA/SEDATION: None FLUOROSCOPY TIME:  42 seconds, 42.7 mGy COMPLICATIONS: None immediate.  PROCEDURE: Informed written consent was obtained from the patient and wife after a thorough discussion of the procedural risks, benefits and alternatives. All questions were addressed. Maximal Sterile Barrier Technique was utilized including caps, mask, sterile gowns, sterile gloves, sterile drape,  hand hygiene and skin antiseptic. A timeout was performed prior to the initiation of the procedure. The remaining cholecystostomy tube and surrounding skin were prepped and draped in a sterile fashion. A Bentson wire was advanced through the tube and into the gallbladder with fluoroscopic guidance. The old tube was removed. A new 10.2 [place] multipurpose drain was easily advanced into the gallbladder with fluoroscopic guidance. Contrast injection confirmed placement in the gallbladder. Skin was anesthetized with 1% lidocaine. Catheter was sutured to the skin. FINDINGS: The old cholecystostomy tube was cut and only a small portion was sticking out of the skin. New tube was positioned in the gallbladder. There is filling of the cystic duct but no filling of the common bile duct. The aspirated bile from the gallbladder is very cloudy and concern for purulent or infected material. Fluid was sent for culture. IMPRESSION: Successful exchange of the cholecystostomy tube with fluoroscopy. Bile was sent for culture. Electronically Signed   By: Markus Daft M.D.   On: 12/09/2016 09:30    Labs:  CBC:  Recent Labs  10/21/16 0453 10/22/16 0357 12/08/16 1212 12/09/16 0351  WBC 9.1 9.9 8.8 6.4  HGB 12.0* 11.0* 11.2* 9.7*  HCT 36.5* 32.7* 33.9* 29.5*  PLT 154 162 250 248    COAGS:  Recent Labs  12/28/15 0550  11/19/16 11/26/16 12/08/16 1212 12/09/16 1020  INR  --   < > 4.2* 3.3* 1.84 2.34  APTT 47*  --   --   --   --   --   < > = values in this interval not displayed.  BMP:  Recent Labs  10/23/16 1008 11/10/16 1101 12/08/16 1212 12/09/16 0351  NA 139 140 140 140  K 3.8 4.3 4.1 3.6  CL 105 106 106 108  CO2 27 25 22 24   GLUCOSE 322* 220* 316* 320*  BUN 38* 15 30* 25*  CALCIUM 8.0* 8.1* 8.9 8.3*  CREATININE 1.52* 1.11 1.40* 1.36*  GFRNONAA 44* >60 48* 50*  GFRAA 51* >60 56* 58*    LIVER FUNCTION TESTS:  Recent Labs  10/21/16 0453 10/22/16 0357 12/08/16 1212 12/09/16 0351  BILITOT  0.9 0.5 1.0 0.8  AST 36 27 14* 10*  ALT 59 39 19 14*  ALKPHOS 93 90 191* 136*  PROT 6.0* 5.9* 7.6 6.5  ALBUMIN 1.9* 1.9* 2.9* 2.5*    Assessment and Plan: Pt s/p perc cholecystostomy 10/18/16 for cholecystitis; presented yesterday with leaking/fractured GB drain, s/p tube exchange 4/16 pm; tube currently working well and pt afebrile, white blood cell count normal, hemoglobin 9.7, platelets 248k, total bilirubin normal, other LFTs mildly elevated ,creatinine 1.36 (1.40),  PT 26.1/INR 2.34, bile cultures pending (gm neg rods); continue with gallbladder drain normal saline flushes; continue IV hydration ;patient scheduled for follow-up cholangiogram in IR clinic on 12/31/16 ; CT abdomen /pelvis yesterday shows pancreatic body lesion with hypervascular liver lesions, wall thickening in distal rectum; plans noted ( but no order received) for potential liver/pancreatic lesion biopsy. Imaging studies have been reviewed by Dr. Earleen Newport and he will discuss case with Dr. Michail Sermon.    Electronically Signed: D. Rowe Robert 12/09/2016, 2:02 PM   I spent a total of 20 minutes at the the patient's bedside AND on the patient's hospital  floor or unit, greater than 50% of which was counseling/coordinating care for percutaneous cholecystostomy    Patient ID: [name] [name], male   DOB: Nov 27, 1945, 71 y.o.   MRN: [id number]

## 2016-12-09 NOTE — Consult Note (Signed)
Referring Provider: Dr. Waldron Labs Primary Care Physician:  [name], MD Primary Gastroenterologist:  Althia Forts  Reason for Consultation:  Pancreatic Mass  HPI: [name] [name] is a 71 y.o. 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 He is s/p a cholecystostomy tube in February that was replaced yesterday. He denies abdominal pain, nausea, and vomiting. Has been losing weight but unable to tell me how much. ALP mildly elevated at 136 and other liver enzymes are nromal. On chronic Coumadin. S/P bilateral BKA.    Past Medical History:  Diagnosis Date  . Anemia    takes Iron pill daily  . Arthritis   . Automatic implantable cardioverter-defibrillator in situ    a. 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  . CAD (coronary artery disease)   . Cardiomyopathy, nonischemic ([place])    a. Cath 2003: mild nonobstructive CAD, EF 25% at that time.  . Chronic systolic CHF (congestive heart failure) (HCC)    takes Furosemide daily as well as Aldactone  . CKD (chronic kidney disease), stage III   . Complication of anesthesia   . Constipation    takes Sennokot daily  . Dementia   . Gangrenous toe, Lt toe 09/21/2013  . GERD (gastroesophageal reflux disease)    takes Nexium daily  . Headache    occasionally  . High cholesterol    takes Atorvastatin daily  . History of blood transfusion    no abnormal reaction noted  . History of bronchitis    > 1 yr ago  . History of kidney stones   . Hypertension    takes Coreg daily  . LBBB (left bundle branch block)    takes Coumadin daily  . NSVT (nonsustained ventricular tachycardia) ([place])    a. Noted on ICD interrogation in 2011.  [name] PAD (peripheral artery disease) ([place])    a. 08/2013 Periph Angio/PTA: Abd Ao nl, RLE- 3v runoff, PT diff dzs, AT 90p, LLE 2v runoff, PT 100, AT 75m (diamondback  ORA/chocolate balloon PTA).  [name] PAF (paroxysmal atrial fibrillation) ([place])    a. Noted on ICD interrogation 2012;  b. coumadin d/c'd 01/2013.  [name] PAF (paroxysmal atrial fibrillation) ([place])   . Peripheral neuropathy    hands;numbness and tingling   . Pneumonia    hx of > 59yr ago  . PONV (postoperative nausea and vomiting)   . Type II diabetes mellitus ([place])    takes Levemir daily as well as Novolog  . Urinary frequency   . Urinary urgency     Past Surgical History:  Procedure Laterality Date  . ABDOMINAL ANGIOGRAM  09/12/2013   Procedure: ABDOMINAL ANGIOGRAM;  Surgeon: Lorretta Harp, MD;  Location: [facility] CATH LAB;  Service: Cardiovascular;;  . AMPUTATION Left 10/04/2013   Procedure: LEFT GREAT TOE AND SECOND TOE AMPUTATION;  Surgeon: Marianna Payment, MD;  Location: [place];  Service: Orthopedics;  Laterality: Left;  . AMPUTATION Left 11/09/2013   Procedure: LEFT AMPUTATION BELOW KNEE;  Surgeon: Marianna Payment, MD;  Location: [place];  Service: Orthopedics;  Laterality: Left;  . AMPUTATION Right 01/04/2014   Procedure: Doristine Devoid second and third toe amputation;  Surgeon: Marianna Payment, MD;  Location: [place];  Service: Orthopedics;  Laterality: Right;  . AMPUTATION Right 01/30/2014   Procedure: RIGHT BELOW KNEE AMPUTATION;  Surgeon: Marianna Payment, MD;  Location: [employer];  Service:  Orthopedics;  Laterality: Right;  . ATHERECTOMY Right 12/29/2013   Procedure: ATHERECTOMY;  Surgeon: Lorretta Harp, MD;  Location: [facility] CATH LAB;  Service: Cardiovascular;  Laterality: Right;  Anterior Tibial Artery  . CARDIAC CATHETERIZATION  10/01/2001   THERE WAS GLOBAL HYPOKINESIS AND EF 25%. THERE APPEARED TO BE GLOBAL DECREASE IN WALL MOTION  . CARDIAC DEFIBRILLATOR PLACEMENT  06/2009   WITH GENERATOR REPLACED; BiV ICD  . CARDIOVASCULAR STRESS TEST  03/20/2009   EF 33%  . [place]  . COLONOSCOPY    . ESOPHAGOGASTRODUODENOSCOPY    . HIP ARTHROPLASTY Left 12/28/2015   Procedure: POSTERIOR   APPROACH HEMI HIP ARTHROPLASTY;  Surgeon: Leandrew Koyanagi, MD;  Location: [place];  Service: Orthopedics;  Laterality: Left;  . IR CATHETER TUBE CHANGE  12/08/2016  . IR GENERIC HISTORICAL  10/18/2016   IR PERC CHOLECYSTOSTOMY 10/18/2016 [name], MD [facility]-INTERV RAD  . LEG AMPUTATION BELOW KNEE Left 11/09/2013   DR Erlinda Hong  . LITHOTRIPSY  2001  . LOWER EXTREMITY ANGIOGRAM Bilateral 09/12/2013   Procedure: LOWER EXTREMITY ANGIOGRAM;  Surgeon: Lorretta Harp, MD;  Location: [facility] CATH LAB;  Service: Cardiovascular;  Laterality: Bilateral;  . LOWER EXTREMITY ANGIOGRAM N/A 12/29/2013   Procedure: LOWER EXTREMITY ANGIOGRAM;  Surgeon: Lorretta Harp, MD;  Location: [facility] CATH LAB;  Service: Cardiovascular;  Laterality: N/A;  . STUMP REVISION Left 01/03/2015   Procedure: LEFT BELOW KNEE AMPUTATION REVISION;  Surgeon: Leandrew Koyanagi, MD;  Location: [place];  Service: Orthopedics;  Laterality: Left;  . TOE AMPUTATION  10/04/2013   LEFT GREAT TOE AND 4TH TOE   /   DR Erlinda Hong  . TRANSLUMINAL ATHERECTOMY TIBIAL ARTERY Left 09/12/2013  . US ECHOCARDIOGRAPHY  03/21/2008   EF 30-35%    Prior to Admission medications   Medication Sig Start Date End Date Taking? Authorizing Provider  acetaminophen (TYLENOL) 325 MG tablet Take 2 tablets (650 mg total) by mouth every 6 (six) hours as needed for mild pain (or Fever >/= 101). 05/03/16  Yes Maryann Mikhail, DO  atorvastatin (LIPITOR) 10 MG tablet Take 1 tablet (10 mg total) by mouth daily. 01/15/15  Yes Daniel J Angiulli, PA-C  Cholecalciferol (VITAMIN D3) 10000 units TABS Take 1 tablet by mouth daily.   Yes Historical Provider, MD  furosemide (LASIX) 40 MG tablet Take 1 tablet (40 mg total) by mouth daily. 01/02/16  Yes Theodis Blaze, MD  insulin detemir (LEVEMIR) 100 UNIT/ML injection Inject 0.15 mLs (15 Units total) into the skin 2 (two) times daily. 10/23/16  Yes Velvet Bathe, MD  iron polysaccharides (NIFEREX) 150 MG capsule Take 1 capsule (150 mg total) by mouth daily. 01/15/15  Yes Daniel  J Angiulli, PA-C  Multiple Vitamin (MULTIVITAMIN WITH MINERALS) TABS tablet Take 1 tablet by mouth daily.   Yes Historical Provider, MD  omeprazole (PRILOSEC) 20 MG capsule Take 20 mg by mouth daily.   Yes Historical Provider, MD  sacubitril-valsartan (ENTRESTO) 24-26 MG Take 1 tablet by mouth 2 (two) times daily. 10/31/15  Yes Deboraha Sprang, MD  spironolactone (ALDACTONE) 25 MG tablet Take 1 tablet (25 mg total) by mouth daily. 11/10/16  Yes Larey Dresser, MD  carvedilol (COREG) 25 MG tablet  12/08/16   Historical Provider, MD  carvedilol (COREG) 6.25 MG tablet Take 1 tablet (6.25 mg total) by mouth 2 (two) times daily with a meal. 10/23/16   Velvet Bathe, MD  metroNIDAZOLE (FLAGYL) 500 MG tablet  12/02/16   Historical  Provider, MD  pregabalin (LYRICA) 75 MG capsule Take 1 capsule (75 mg total) by mouth 2 (two) times daily. 01/15/15   Lavon Paganini Angiulli, PA-C  warfarin (COUMADIN) 10 MG tablet Take 1 tablet (10 mg total) by mouth daily. Patient taking differently: Take 10-15 mg by mouth as directed. Take 1 tablet (10 mg) by mouth daily except on Wed Take 1.5 tablets (15 mg). 10/23/16   Velvet Bathe, MD  warfarin (COUMADIN) 7.5 MG tablet Take 7.5 mg by mouth at bedtime. 11/12/16   Historical Provider, MD    Scheduled Meds: . Chlorhexidine Gluconate Cloth  6 each Topical Q0600  . insulin aspart  0-5 Units Subcutaneous QHS  . insulin aspart  0-9 Units Subcutaneous TID WC  . insulin detemir  15 Units Subcutaneous BID  . mupirocin ointment  1 application Nasal BID  . pantoprazole  40 mg Oral BID  . sacubitril-valsartan  1 tablet Oral BID  . spironolactone  25 mg Oral Daily   Continuous Infusions: . sodium chloride 50 mL/hr at 12/08/16 1731  . piperacillin-tazobactam (ZOSYN)  IV     PRN Meds:.acetaminophen **OR** acetaminophen, albuterol, alum & mag hydroxide-simeth, HYDROcodone-acetaminophen, iopamidol, lidocaine  Allergies as of 12/08/2016  . (No Known Allergies)    Family History  Problem  Relation Age of Onset  . Heart disease Mother   . Hypertension Mother   . Diabetes Mother   . Diabetes Father     Social History   Social History  . Marital status: Married    Spouse name: N/A  . Number of children: N/A  . Years of education: N/A   Occupational History  . Not on file.   Social History Main Topics  . Smoking status: Never Smoker  . Smokeless tobacco: Never Used  . Alcohol use No  . Drug use: No  . Sexual activity: Not Currently   Other Topics Concern  . Not on file   Social History Narrative  . No narrative on file    Review of Systems: All negative except as stated above in HPI.  Physical Exam: Vital signs: Vitals:   12/08/16 2048 12/09/16 0455  BP: 129/70 128/84  Pulse: 98 83  Resp: 18 18  Temp: 98 F (36.7 C) 98.4 F (36.9 C)   Last BM Date: 12/09/16 General:   Lethargic, elderly, frail, no acute distress  HEENT: anicteric sclera Neck: supple, nontender Lungs:  Clear throughout to auscultation.   No wheezes, crackles, or rhonchi. No acute distress. Heart:  Regular rate and rhythm; no murmurs, clicks, rubs,  or gallops. Abdomen: soft, nontender, nondistended, +BS, small umbilical hernia  Rectal:  Deferred Ext: no edema in bilateral thighs, s/p bilateral BKA  GI:  Lab Results:  Recent Labs  12/08/16 1212 12/09/16 0351  WBC 8.8 6.4  HGB 11.2* 9.7*  HCT 33.9* 29.5*  PLT 250 248   BMET  Recent Labs  12/08/16 1212 12/09/16 0351  NA 140 140  K 4.1 3.6  CL 106 108  CO2 22 24  GLUCOSE 316* 320*  BUN 30* 25*  CREATININE 1.40* 1.36*  CALCIUM 8.9 8.3*   LFT  Recent Labs  12/09/16 0351  PROT 6.5  ALBUMIN 2.5*  AST 10*  ALT 14*  ALKPHOS 136*  BILITOT 0.8   PT/INR  Recent Labs  12/08/16 1212  LABPROT 21.5*  INR 1.84     Studies/Results: Dg Chest 2 View  Result Date: 12/08/2016 CLINICAL DATA:  Increasing generalized weakness since Friday, per wife pt also  has gb stent that broke and is draining everywhere,  no other chest complaints EXAM: CHEST  2 VIEW COMPARISON:  10/17/2016 FINDINGS: There is a linear band of airspace disease in the right middle lobe likely reflecting atelectasis. There is no pleural effusion or pneumothorax. The heart and mediastinal contours are unremarkable. There is a 3 lead cardiac pacemaker. The osseous structures are unremarkable. IMPRESSION: No active cardiopulmonary disease. Electronically Signed   By: Kathreen Devoid   On: 12/08/2016 11:57   Ct Abdomen Pelvis W Contrast  Result Date: 12/08/2016 CLINICAL DATA:  Generalized weakness. History of cholecystostomy tube. EXAM: CT ABDOMEN AND PELVIS WITH CONTRAST TECHNIQUE: Multidetector CT imaging of the abdomen and pelvis was performed using the standard protocol following bolus administration of intravenous contrast. CONTRAST:  180mL ISOVUE-300 IOPAMIDOL (ISOVUE-300) INJECTION 61% COMPARISON:  10/17/2016 FINDINGS: Lower chest:  Bibasilar dependent atelectasis. Hepatobiliary: 13 mm subcapsular lesion lateral segment left liver with hyper perfusion in the surrounding parenchyma. This appears progressed since prior study. 13 mm hypervascular lesion noted in the hepatic dome (image 15 series 2). The cholecystostomy tube visualized with loop formed in the region of the gallbladder neck. Gas in the gallbladder lumen is compatible with the presence of the drain. Insert no biliary Pancreas: 2.1 x 2.5 cm lesion identified in the body the pancreas with markedly atrophic pancreatic tail and dilated duct in the tail the pancreas that terminates at the level of the mass. Spleen: No splenomegaly. No focal mass lesion. Adrenals/Urinary Tract: No adrenal nodule or mass. Kidneys unremarkable. No evidence for hydroureter. Left bladder wall obscured by streak artifact from hip replacement. Stomach/Bowel: Stomach is nondistended. No gastric wall thickening. No evidence of outlet obstruction. Duodenum is normally positioned as is the ligament of Treitz. No small  bowel wall thickening. No small bowel dilatation. The terminal ileum is normal. The appendix is normal. Irregular wall thickening in the hepatic flexure likely secondary to the gallbladder process. Transverse and descending colon unremarkable. Diverticular changes noted sigmoid colon circumferential wall thickening is seen in the rectum and there is a 1.6 cm rim enhancing structure just posterior to the anal rectal junction. Vascular/Lymphatic: There is abdominal aortic atherosclerosis without aneurysm. There is no gastrohepatic or hepatoduodenal ligament lymphadenopathy. No intraperitoneal or retroperitoneal lymphadenopathy. No pelvic sidewall lymphadenopathy. Reproductive: The prostate gland and seminal vesicles have normal imaging features. Other: No intraperitoneal free fluid. Musculoskeletal: Status post left total hip replacement. Bone windows reveal no worrisome lytic or sclerotic osseous lesions. IMPRESSION: 1. 2.5 cm pancreatic body lesion obstructs the main pancreatic duct in the tail the pancreas generating marked pancreatic tail atrophy. Imaging features highly suspicious for pancreatic neoplasm and adenocarcinoma a distinct concern. 2. Hypervascular liver lesions raise concern for metastatic disease given findings described immediately above. 3. Circumferential wall thickening in the distal rectum with a small rim enhancing focus just posterior to the anal verge. Perirectal abscess could have this appearance. 4. Cholecystostomy tube with irregular gallbladder wall thickening. Pericholecystic stranding is associated with thickening of the colonic wall in the region the hepatic flexure, likely secondary to the primary gallbladder process. 5.  Abdominal Aortic Atherosclerois (ICD10-170.0) Results Were called by me at the time of interpretation on 12/08/2016 at 4:22 pm to Dr. Margarita Mail , who verbally acknowledged these results. Electronically Signed   By: Misty Stanley M.D.   On: 12/08/2016 16:23   Ir  Catheter Tube Change  Result Date: 12/09/2016 INDICATION: 71 year old with multiple medical problems including a cholecystostomy tube. Cholecystostomy tube was reportedly leaking.  Examination of the cholecystostomy tube demonstrated that the tube had been cut and only a small portion of the tube was sticking out of the skin. Arrangements were made for cholecystostomy tube replacement. EXAM: EXCHANGE OF CHOLECYSTOSTOMY TUBE WITH FLUOROSCOPY MEDICATIONS: None ANESTHESIA/SEDATION: None FLUOROSCOPY TIME:  42 seconds, 36.0 mGy COMPLICATIONS: None immediate. PROCEDURE: Informed written consent was obtained from the patient and wife after a thorough discussion of the procedural risks, benefits and alternatives. All questions were addressed. Maximal Sterile Barrier Technique was utilized including caps, mask, sterile gowns, sterile gloves, sterile drape, hand hygiene and skin antiseptic. A timeout was performed prior to the initiation of the procedure. The remaining cholecystostomy tube and surrounding skin were prepped and draped in a sterile fashion. A Bentson wire was advanced through the tube and into the gallbladder with fluoroscopic guidance. The old tube was removed. A new 10.2 [place] multipurpose drain was easily advanced into the gallbladder with fluoroscopic guidance. Contrast injection confirmed placement in the gallbladder. Skin was anesthetized with 1% lidocaine. Catheter was sutured to the skin. FINDINGS: The old cholecystostomy tube was cut and only a small portion was sticking out of the skin. New tube was positioned in the gallbladder. There is filling of the cystic duct but no filling of the common bile duct. The aspirated bile from the gallbladder is very cloudy and concern for purulent or infected material. Fluid was sent for culture. IMPRESSION: Successful exchange of the cholecystostomy tube with fluoroscopy. Bile was sent for culture. Electronically Signed   By: Markus Daft M.D.   On: 12/09/2016  09:30    Impression/Plan: Pancreatic mass with mets to the liver - no evidence of biliary obstruction. S/P cholecystostomy tube change. IF patient will be a candidate for chemotherapy by oncology then would recommend a liver biopsy of a lesion by IR as the next step for diagnosis BUT if he will not be a candidate for treatment then would recommend palliative measures. Defer to primary team to consult oncology/IR if deemed necessary. Would not recommend an EUS as the next step unless liver biopsy of a lesion is not possible. Continue supportive care. Please call us back if needed.    LOS: 1 day   [place] C.  12/09/2016, 10:20 AM  Pager [phone]  AFTER 5 pm or on weekends please call [phone]

## 2016-12-09 NOTE — Evaluation (Addendum)
Physical Therapy Evaluation Patient Details Name: [name] [name] MRN: [id number] DOB: October 30, 1945 Today's Date: 12/09/2016   History of Present Illness  [name] [name]  is a 71 y.o. male, with medical history significant of NICM status post ICD, CHF with EF to 20-25% in 2014, DM-2, CKD, PAF on coumadin, bilateral BKA , with recent hospitalization secondary to cholecystitis, discharged with cholecystostomy tube, patient presents 12/08/16  with progressive weakness, failure to thrive, no appetite weight loss, intermittent fever as well secondary to malfunction and cholecystomy drain as bag has been disconnected, patient is a febrile, but CT abdomen and pelvis showing evidence of pancreatic mass or pancreatic ductal obstruction,, with possible liver metastases, and abnormal finding of perirectal area suspicious for abscess  Clinical Impression  The patient  Is able to mobilize to standing with assistance and take a few steps with RW. No family present to discuss DC plans/caregivers. The patient was not able to give full information about PLOF. Pt admitted with above diagnosis. Pt currently with functional limitations due to the deficits listed below (see PT Problem List).  Pt will benefit from skilled PT to increase their independence and safety with mobility to allow discharge to the venue listed below.       Follow Up Recommendations Home health PT;Supervision/Assistance - 64 hour;SNF (wife not present to confirm DC plan)    Equipment Recommendations  None recommended by PT    Recommendations for Other Services       Precautions / Restrictions Precautions Precaution Comments: right drain from back Required Braces or Orthoses: Other Brace/Splint Other Brace/Splint: bil BKA prosthetics      Mobility  Bed Mobility Overal bed mobility: Needs Assistance Bed Mobility: Supine to Sit     Supine to sit: Min assist     General bed mobility comments: assisted patient with donning  prosthesis in bed. then assisted to sitting at bed edge.   Transfers Overall transfer level: Needs assistance Equipment used: Rolling walker (2 wheeled) Transfers: Sit to/from Stand Sit to Stand: Min assist;+2 safety/equipment;From elevated surface         General transfer comment: Stood at [employer], took side steps x 5  , sat and rested then stood againe and took 4 forward and backward.  Prosthetics removed in sitting at bed edge.  Ambulation/Gait             General Gait Details: just a few side and backwards as above  Stairs            Wheelchair Mobility    Modified Rankin (Stroke Patients Only)       Balance Overall balance assessment: Needs assistance Sitting-balance support: Feet supported;Bilateral upper extremity supported Sitting balance-Leahy Scale: Fair     Standing balance support: During functional activity;Bilateral upper extremity supported Standing balance-Leahy Scale: Poor                               Pertinent Vitals/Pain Pain Assessment: No/denies pain    Home Living Family/patient expects to be discharged to:: Private residence Living Arrangements: Spouse/significant other Available Help at Discharge: Family;Available 24 hours/day Type of Home: House Home Access: Ramped entrance     Home Layout: One level Home Equipment: Walker - 2 wheels;Wheelchair - manual;Tub bench;Bedside commode Additional Comments: pt still does sponge bathing.      Prior Function Level of Independence: Needs assistance   Gait / Transfers Assistance Needed: Ambulates with RW and assistance. has HHPT.  ADL's / Homemaking Assistance Needed: Pt reports he only takes sponge baths.From last admission, pt's brother stays with him while wife is at work and helps him as needed.  (info from previous admission.)        Hand Dominance        Extremity/Trunk Assessment   Upper Extremity Assessment Upper Extremity Assessment: Overall WFL for tasks  assessed    Lower Extremity Assessment Lower Extremity Assessment: LLE deficits/detail;RLE deficits/detail RLE Deficits / Details: BKA LLE Deficits / Details: BKA    Cervical / Trunk Assessment Cervical / Trunk Assessment: Normal  Communication   Communication: No difficulties  Cognition Arousal/Alertness: Awake/alert Behavior During Therapy: WFL for tasks assessed/performed Overall Cognitive Status: History of cognitive impairments - at baseline Area of Impairment: Orientation                 Orientation Level: Time                    General Comments      Exercises     Assessment/Plan    PT Assessment Patient needs continued PT services  PT Problem List Decreased strength;Decreased activity tolerance;Decreased mobility;Decreased knowledge of use of DME;Decreased safety awareness;Decreased cognition;Decreased knowledge of precautions       PT Treatment Interventions DME instruction;Gait training;Functional mobility training;Therapeutic activities;Therapeutic exercise;Patient/family education    PT Goals (Current goals can be found in the Care Plan section)  Acute Rehab PT Goals Patient Stated Goal: to feel better PT Goal Formulation: With patient Time For Goal Achievement: 12/23/16 Potential to Achieve Goals: Good    Frequency Min 3X/week   Barriers to discharge        Co-evaluation               End of Session Equipment Utilized During Treatment: Gait belt;Other (comment) ( bil Prosthetics) Activity Tolerance: Patient tolerated treatment well Patient left: in bed;with call bell/phone within reach;with bed alarm set Nurse Communication: Mobility status PT Visit Diagnosis: Difficulty in walking, not elsewhere classified (R26.2)    Time: 4503-8882 PT Time Calculation (min) (ACUTE ONLY): 26 min   Charges:   PT Evaluation $PT Eval Low Complexity: 1 Procedure PT Treatments $Gait Training: 8-22 mins   PT G CodesTresa [name]  PT [phone]  [name] [name] 12/09/2016, 4:29 PM

## 2016-12-10 DIAGNOSIS — Z89512 Acquired absence of left leg below knee: Secondary | ICD-10-CM

## 2016-12-10 DIAGNOSIS — N183 Chronic kidney disease, stage 3 (moderate): Secondary | ICD-10-CM

## 2016-12-10 DIAGNOSIS — I428 Other cardiomyopathies: Secondary | ICD-10-CM

## 2016-12-10 DIAGNOSIS — Z7901 Long term (current) use of anticoagulants: Secondary | ICD-10-CM

## 2016-12-10 DIAGNOSIS — Z89511 Acquired absence of right leg below knee: Secondary | ICD-10-CM

## 2016-12-10 DIAGNOSIS — T85518A Breakdown (mechanical) of other gastrointestinal prosthetic devices, implants and grafts, initial encounter: Secondary | ICD-10-CM

## 2016-12-10 DIAGNOSIS — E785 Hyperlipidemia, unspecified: Secondary | ICD-10-CM

## 2016-12-10 LAB — PROTIME-INR
INR: 1.09
PROTHROMBIN TIME: 14.2 s (ref 11.4–15.2)

## 2016-12-10 LAB — BASIC METABOLIC PANEL
Anion gap: 9 (ref 5–15)
BUN: 17 mg/dL (ref 6–20)
CALCIUM: 8.2 mg/dL — AB (ref 8.9–10.3)
CO2: 22 mmol/L (ref 22–32)
CREATININE: 1.25 mg/dL — AB (ref 0.61–1.24)
Chloride: 108 mmol/L (ref 101–111)
GFR calc Af Amer: >60 mL/min (ref >60)
GFR calc non Af Amer: 55 mL/min — ABNORMAL LOW (ref >60)
GLUCOSE: 191 mg/dL — AB (ref 65–99)
Potassium: 3.5 mmol/L (ref 3.5–5.1)
Sodium: 139 mmol/L (ref 135–145)

## 2016-12-10 LAB — GLUCOSE, CAPILLARY
GLUCOSE-CAPILLARY: 195 mg/dL — AB (ref 65–99)
GLUCOSE-CAPILLARY: 203 mg/dL — AB (ref 65–99)
Glucose-Capillary: 274 mg/dL — ABNORMAL HIGH (ref 65–99)

## 2016-12-10 LAB — CBC
HEMATOCRIT: 30.6 % — AB (ref 39.0–52.0)
Hemoglobin: 10 g/dL — ABNORMAL LOW (ref 13.0–17.0)
MCH: 23.5 pg — ABNORMAL LOW (ref 26.0–34.0)
MCHC: 32.7 g/dL (ref 30.0–36.0)
MCV: 71.8 fL — AB (ref 78.0–100.0)
PLATELETS: 280 10*3/uL (ref 150–400)
RBC: 4.26 MIL/uL (ref 4.22–5.81)
RDW: 18.4 % — AB (ref 11.5–15.5)
WBC: 8 10*3/uL (ref 4.0–10.5)

## 2016-12-10 LAB — C DIFFICILE QUICK SCREEN W PCR REFLEX
C DIFFICILE (CDIFF) INTERP: DETECTED
C Diff antigen: POSITIVE — AB
C Diff toxin: POSITIVE — AB

## 2016-12-10 LAB — CANCER ANTIGEN 19-9: CA 19-9: 141 U/mL — ABNORMAL HIGH (ref 0–35)

## 2016-12-10 MED ORDER — VANCOMYCIN 50 MG/ML ORAL SOLUTION
125.0000 mg | Freq: Four times a day (QID) | ORAL | Status: DC
  Administered 2016-12-10 – 2016-12-16 (×24): 125 mg via ORAL
  Filled 2016-12-10 (×28): qty 2.5

## 2016-12-10 NOTE — Care Management Important Message (Addendum)
Important Message  Patient Details IM Letter given to Nora/Case Management to present to Patient Name: [name] [name] MRN: [id number] Date of Birth: 02/19/1946   Medicare Important Message Given:  Yes    [name] 12/10/2016, 10:14 AMImportant Message  Patient Details  Name: [name] [name] MRN: [id number] Date of Birth: February 26, 1946   Medicare Important Message Given:  Yes    [name] 12/10/2016, 10:14 AM

## 2016-12-10 NOTE — Progress Notes (Signed)
Patient ID: [name] [name], male   DOB: [birth year], 71 y.o.   MRN: [id number]    Referring Physician(s): Dr. Johnathan Hausen  Supervising Physician: Corrie Mckusick  Patient Status: [facility] - In-pt  Chief Complaint: Leaking/fracture perc chole drain  Subjective: Patient with no new complaints today.  Drain is working well.  Allergies: Patient has no known allergies.  Medications: Prior to Admission medications   Medication Sig Start Date End Date Taking? Authorizing Provider  acetaminophen (TYLENOL) 325 MG tablet Take 2 tablets (650 mg total) by mouth every 6 (six) hours as needed for mild pain (or Fever >/= 101). 05/03/16  Yes Maryann Mikhail, DO  atorvastatin (LIPITOR) 10 MG tablet Take 1 tablet (10 mg total) by mouth daily. 01/15/15  Yes Daniel J Angiulli, PA-C  Cholecalciferol (VITAMIN D3) 10000 units TABS Take 1 tablet by mouth daily.   Yes Historical Provider, MD  furosemide (LASIX) 40 MG tablet Take 1 tablet (40 mg total) by mouth daily. 01/02/16  Yes Theodis Blaze, MD  insulin detemir (LEVEMIR) 100 UNIT/ML injection Inject 0.15 mLs (15 Units total) into the skin 2 (two) times daily. 10/23/16  Yes Velvet Bathe, MD  iron polysaccharides (NIFEREX) 150 MG capsule Take 1 capsule (150 mg total) by mouth daily. 01/15/15  Yes Daniel J Angiulli, PA-C  Multiple Vitamin (MULTIVITAMIN WITH MINERALS) TABS tablet Take 1 tablet by mouth daily.   Yes Historical Provider, MD  omeprazole (PRILOSEC) 20 MG capsule Take 20 mg by mouth daily.   Yes Historical Provider, MD  sacubitril-valsartan (ENTRESTO) 24-26 MG Take 1 tablet by mouth 2 (two) times daily. 10/31/15  Yes Deboraha Sprang, MD  spironolactone (ALDACTONE) 25 MG tablet Take 1 tablet (25 mg total) by mouth daily. 11/10/16  Yes Larey Dresser, MD  carvedilol (COREG) 25 MG tablet  12/08/16   Historical Provider, MD  carvedilol (COREG) 6.25 MG tablet Take 1 tablet (6.25 mg total) by mouth 2 (two) times daily with a meal. 10/23/16   Velvet Bathe, MD    metroNIDAZOLE (FLAGYL) 500 MG tablet  12/02/16   Historical Provider, MD  pregabalin (LYRICA) 75 MG capsule Take 1 capsule (75 mg total) by mouth 2 (two) times daily. 01/15/15   Lavon Paganini Angiulli, PA-C  warfarin (COUMADIN) 10 MG tablet Take 1 tablet (10 mg total) by mouth daily. Patient taking differently: Take 10-15 mg by mouth as directed. Take 1 tablet (10 mg) by mouth daily except on Wed Take 1.5 tablets (15 mg). 10/23/16   Velvet Bathe, MD  warfarin (COUMADIN) 7.5 MG tablet Take 7.5 mg by mouth at bedtime. 11/12/16   Historical Provider, MD    Vital Signs: BP 132/70 (BP Location: Left Arm)   Pulse 100   Temp 98.4 F (36.9 C) (Oral)   Resp 18   Ht 5\' 9"  (1.753 m)   Wt 210 lb (95.3 kg)   SpO2 99%   BMI 31.01 kg/m   Physical Exam: abd: soft, drain site is c/d/I with no currently leaking.  Drain with bilious output.    Imaging: Dg Chest 2 View  Result Date: 12/08/2016 CLINICAL DATA:  Increasing generalized weakness since Friday, per wife pt also has gb stent that broke and is draining everywhere, no other chest complaints EXAM: CHEST  2 VIEW COMPARISON:  10/17/2016 FINDINGS: There is a linear band of airspace disease in the right middle lobe likely reflecting atelectasis. There is no pleural effusion or pneumothorax. The heart and mediastinal contours are unremarkable. There is a 3  lead cardiac pacemaker. The osseous structures are unremarkable. IMPRESSION: No active cardiopulmonary disease. Electronically Signed   By: Kathreen Devoid   On: 12/08/2016 11:57   Ct Abdomen Pelvis W Contrast  Result Date: 12/08/2016 CLINICAL DATA:  Generalized weakness. History of cholecystostomy tube. EXAM: CT ABDOMEN AND PELVIS WITH CONTRAST TECHNIQUE: Multidetector CT imaging of the abdomen and pelvis was performed using the standard protocol following bolus administration of intravenous contrast. CONTRAST:  14mL ISOVUE-300 IOPAMIDOL (ISOVUE-300) INJECTION 61% COMPARISON:  10/17/2016 FINDINGS: Lower chest:   Bibasilar dependent atelectasis. Hepatobiliary: 13 mm subcapsular lesion lateral segment left liver with hyper perfusion in the surrounding parenchyma. This appears progressed since prior study. 13 mm hypervascular lesion noted in the hepatic dome (image 15 series 2). The cholecystostomy tube visualized with loop formed in the region of the gallbladder neck. Gas in the gallbladder lumen is compatible with the presence of the drain. Insert no biliary Pancreas: 2.1 x 2.5 cm lesion identified in the body the pancreas with markedly atrophic pancreatic tail and dilated duct in the tail the pancreas that terminates at the level of the mass. Spleen: No splenomegaly. No focal mass lesion. Adrenals/Urinary Tract: No adrenal nodule or mass. Kidneys unremarkable. No evidence for hydroureter. Left bladder wall obscured by streak artifact from hip replacement. Stomach/Bowel: Stomach is nondistended. No gastric wall thickening. No evidence of outlet obstruction. Duodenum is normally positioned as is the ligament of Treitz. No small bowel wall thickening. No small bowel dilatation. The terminal ileum is normal. The appendix is normal. Irregular wall thickening in the hepatic flexure likely secondary to the gallbladder process. Transverse and descending colon unremarkable. Diverticular changes noted sigmoid colon circumferential wall thickening is seen in the rectum and there is a 1.6 cm rim enhancing structure just posterior to the anal rectal junction. Vascular/Lymphatic: There is abdominal aortic atherosclerosis without aneurysm. There is no gastrohepatic or hepatoduodenal ligament lymphadenopathy. No intraperitoneal or retroperitoneal lymphadenopathy. No pelvic sidewall lymphadenopathy. Reproductive: The prostate gland and seminal vesicles have normal imaging features. Other: No intraperitoneal free fluid. Musculoskeletal: Status post left total hip replacement. Bone windows reveal no worrisome lytic or sclerotic osseous  lesions. IMPRESSION: 1. 2.5 cm pancreatic body lesion obstructs the main pancreatic duct in the tail the pancreas generating marked pancreatic tail atrophy. Imaging features highly suspicious for pancreatic neoplasm and adenocarcinoma a distinct concern. 2. Hypervascular liver lesions raise concern for metastatic disease given findings described immediately above. 3. Circumferential wall thickening in the distal rectum with a small rim enhancing focus just posterior to the anal verge. Perirectal abscess could have this appearance. 4. Cholecystostomy tube with irregular gallbladder wall thickening. Pericholecystic stranding is associated with thickening of the colonic wall in the region the hepatic flexure, likely secondary to the primary gallbladder process. 5.  Abdominal Aortic Atherosclerois (ICD10-170.0) Results Were called by me at the time of interpretation on 12/08/2016 at 4:22 pm to Dr. Margarita Mail , who verbally acknowledged these results. Electronically Signed   By: Misty Stanley M.D.   On: 12/08/2016 16:23   Ir Catheter Tube Change  Result Date: 12/09/2016 INDICATION: 71 year old with multiple medical problems including a cholecystostomy tube. Cholecystostomy tube was reportedly leaking. Examination of the cholecystostomy tube demonstrated that the tube had been cut and only a small portion of the tube was sticking out of the skin. Arrangements were made for cholecystostomy tube replacement. EXAM: EXCHANGE OF CHOLECYSTOSTOMY TUBE WITH FLUOROSCOPY MEDICATIONS: None ANESTHESIA/SEDATION: None FLUOROSCOPY TIME:  42 seconds, 27.0 mGy COMPLICATIONS: None immediate. PROCEDURE: Informed written  consent was obtained from the patient and wife after a thorough discussion of the procedural risks, benefits and alternatives. All questions were addressed. Maximal Sterile Barrier Technique was utilized including caps, mask, sterile gowns, sterile gloves, sterile drape, hand hygiene and skin antiseptic. A timeout was  performed prior to the initiation of the procedure. The remaining cholecystostomy tube and surrounding skin were prepped and draped in a sterile fashion. A Bentson wire was advanced through the tube and into the gallbladder with fluoroscopic guidance. The old tube was removed. A new 10.2 [place] multipurpose drain was easily advanced into the gallbladder with fluoroscopic guidance. Contrast injection confirmed placement in the gallbladder. Skin was anesthetized with 1% lidocaine. Catheter was sutured to the skin. FINDINGS: The old cholecystostomy tube was cut and only a small portion was sticking out of the skin. New tube was positioned in the gallbladder. There is filling of the cystic duct but no filling of the common bile duct. The aspirated bile from the gallbladder is very cloudy and concern for purulent or infected material. Fluid was sent for culture. IMPRESSION: Successful exchange of the cholecystostomy tube with fluoroscopy. Bile was sent for culture. Electronically Signed   By: Markus Daft M.D.   On: 12/09/2016 09:30    Labs:  CBC:  Recent Labs  10/22/16 0357 12/08/16 1212 12/09/16 0351 12/10/16 0348  WBC 9.9 8.8 6.4 8.0  HGB 11.0* 11.2* 9.7* 10.0*  HCT 32.7* 33.9* 29.5* 30.6*  PLT 162 250 248 280    COAGS:  Recent Labs  12/28/15 0550  11/19/16 11/26/16 12/08/16 1212 12/09/16 1020  INR  --   < > 4.2* 3.3* 1.84 2.34  APTT 47*  --   --   --   --   --   < > = values in this interval not displayed.  BMP:  Recent Labs  11/10/16 1101 12/08/16 1212 12/09/16 0351 12/10/16 0348  NA 140 140 140 139  K 4.3 4.1 3.6 3.5  CL 106 106 108 108  CO2 25 22 24 22   GLUCOSE 220* 316* 320* 191*  BUN 15 30* 25* 17  CALCIUM 8.1* 8.9 8.3* 8.2*  CREATININE 1.11 1.40* 1.36* 1.25*  GFRNONAA >60 48* 50* 55*  GFRAA >60 56* 58* >60    LIVER FUNCTION TESTS:  Recent Labs  10/21/16 0453 10/22/16 0357 12/08/16 1212 12/09/16 0351  BILITOT 0.9 0.5 1.0 0.8  AST 36 27 14* 10*  ALT 59 39  19 14*  ALKPHOS 93 90 191* 136*  PROT 6.0* 5.9* 7.6 6.5  ALBUMIN 1.9* 1.9* 2.9* 2.5*    Assessment and Plan: 1. s/p exchange of perc chole drain secondary to leakage Drain in place and is doing well. Cont with routine irrigation and care No further intervention.  Follow up drain appt is 12-31-16  2. Pancreatic mass with liver lesions Plan for EUS by Dr. Paulita Fujita on Friday.  Please contact us if we can be of further assistance.  Electronically Signed: Henreitta Cea 12/10/2016, 11:26 AM   I spent a total of 15 Minutes at the the patient's bedside AND on the patient's hospital floor or unit, greater than 50% of which was counseling/coordinating care for leaking perc chole drain

## 2016-12-10 NOTE — Progress Notes (Signed)
Discharge plan now back to returning home with home health services. Pt is currently active with [facility] for HHRN/PT. Will need resumption orders at discharge. (phone:  [phone]) (Fax:  [phone]). CM to fax orders and demographics when orders written. Marney Doctor RN,BSN,NCM [phone]

## 2016-12-10 NOTE — Progress Notes (Signed)
CSW consulted for SNF placement. CSW met with pt / spouse at bedside. Spouse reports that pt spent 20 days in [facility] ) last month. SNF confirms this info and reports that pt will be on insurance co payment days ( $160 daily ) at [facility]. SNF reports that they encouraged pt / spouse to apply for medicaid during past admission but spouse had declined. Spouse reports that she cannot afford to give up part of pt's social security check to SNF which is a medicaid requirement.  Due to financial concerns pt / spouse have declined SNF placement at this time. RNCM has been notified and will assist with d/c planning.  Werner Lean LCSW [phone]

## 2016-12-10 NOTE — Progress Notes (Signed)
This CM met with pt and wife at bedside for DC planning. Both pt and wife express desire for pt to go back to [facility] snf for rehab at discharge. CSW made aware. Marney Doctor RN,BSN,NCM [phone]

## 2016-12-10 NOTE — Significant Event (Signed)
Hypoglycemic Event  CBG: 65  Treatment: carbohydrate snack   Symptoms: None  Follow-up CBG: Time:2027 CBG Result:60  Possible Reasons for Event: Inadequate meal intake  Comments/MD notified:Katherine Schorr,NP    Luana Shu, Dorothyann Peng

## 2016-12-10 NOTE — Progress Notes (Signed)
PROGRESS NOTE    [name] [name]  XKG:[id number] DOB: 04/19/1946 DOA: 12/08/2016 PCP: [name], MD   Chief Complaint  Patient presents with  . Weakness  . Drainage from Incision    GALLBLADDER STENT    Brief Narrative:  HPI on 12/08/2016 by Dr. Eston Mould Irani  is a 71 y.o. male, with medical history significant of NICM status post ICD, CHFwith EF to 20-25% in 2014, DM-2, CKD, PAF on coumadin, bilateral BKA , with recent hospitalization secondary to cholecystitis, where patient discharge with cholecystostomy tube, patient presents with progressive weakness, failure to thrive, no appetite weight loss, intermittent fever as well secondary to malfunction and cholecystomy drain as bag has been disconnected, patient is a febrile NAD, with no leukocytosis, but CT abdomen and pelvis showing evidence of pancreatic mass or pancreatic ductal obstruction,, with possible liver metastases, and abnormal finding of perirectal area suspicious for abscess, patient denies any chest pain, shortness of breath, dysuria, polyuria, cough or productive sputum, wife reports he is with progressive weakness, sickly looking on the provided entire weekend, with no appetite and oral intake, I was called to admit. 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 Recommended EUS on 4/19 or 4/20, given that INR will improve with Vitamin K -General surgery consulted and appreciated, however patient not a surgical candidate for resection of Whipple  -Interventional radiology consulted and appreciated, drain was exchanged  -Oncology consulted and appreciated, pending biospy and CA19-9. If biopsy shows metastatic panc ca, palliative care consult would be appropriate  Failure to thrive, progressive weakness -Likely secondary to the  above -PT consulted recommended HH  Cholecystitis -Chronic, status post cholecystostomy, tube was broken and leaking -IR consulted, s/p exchange on 12/08/16   -Body fluid culture: GNR -Placed on Zosyn  Atrial fibrillation -Currently rate controlled -Coumadin held for possible biopsy  -continue Coreg -CHADSVASC 4 (based on age, CHF, HTN, DM)  C Difficile -C diff positive antigen and toxin -Continue PO vancomycin and florastor  Bilateral renal knee amputation -PT consulted and recommended HH  Abormal finding in CT imaging of the rectal area -Gen. surgery input greatly appreciated, no evidence of perirectal abscess.  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% status post AICD placement -Last Echocardiogram 10/14/16: EF 20-25%, diffuse hypokinesis    -continue Coreg, Aldactone, Entresto -Lasix held -Continue to monitor intake/output, daily weights -Currently appears to be euvolemic   Hyperlipidemia -Statin held   Hypertension -BP currently stable -Continue Coreg, Aldactone, Entresto   DVT Prophylaxis  SCDs  Code Status: Full  Family Communication: None at bedside  Disposition Plan: Admitted, pending EUS   Consultants Gastroenterology Interventional radiology Oncology  General Surgery  Procedures  Exchange of perc chole drain (secondary to leakage) by IR  Antibiotics   Anti-infectives    Start     Dose/Rate Route Frequency Ordered Stop   12/10/16 1000  vancomycin (VANCOCIN) 50 mg/mL oral solution 125 mg     125 mg Oral 4 times daily 12/10/16 0331 12/24/16 0959   12/09/16 0000  piperacillin-tazobactam (ZOSYN) IVPB 3.375 g     3.375 g  12.5 mL/hr over 240  Minutes Intravenous Every 8 hours 12/08/16 1808     12/08/16 1730  piperacillin-tazobactam (ZOSYN) IVPB 3.375 g     3.375 g 100 mL/hr over 30 Minutes Intravenous STAT 12/08/16 1722 12/08/16 1810      Subjective:   [name] [name] seen and examined today. Currently denies chest pain, shortness of breath, abdominal pain. Feels mildly better today. Feels diarrhea is improving.   Objective:   Vitals:   12/09/16 2045 12/10/16 0423 12/10/16 0834 12/10/16 1100  BP: 109/87 98/84 132/70 103/62  Pulse: 91 100    Resp: 18 18    Temp: 99 F (37.2 C) 98.4 F (36.9 C)    TempSrc: Oral Oral    SpO2: 98% 99%    Weight:      Height:        Intake/Output Summary (Last 24 hours) at 12/10/16 1207 Last data filed at 12/10/16 0855  Gross per 24 hour  Intake              190 ml  Output              975 ml  Net             -785 ml   Filed Weights   12/08/16 1130  Weight: 95.3 kg (210 lb)    Exam  General: Well developed, well nourished, NAD, appears stated age  HEENT: NCAT, mucous membranes moist.   Cardiovascular: S1 S2 auscultated, no rubs, murmurs or gallops. Regular rate and rhythm.  Respiratory: Clear to auscultation bilaterally with equal chest rise  Abdomen: Soft, mild epigastric TTP, nondistended, + bowel sounds, +cholecystectomy tube with drainage  Extremities: B/L BKA  Neuro: AAOx3, nonfocal  Psych: Normal affect and demeanor with intact judgement and insight   Data Reviewed: I have personally reviewed following labs and imaging studies  CBC:  Recent Labs Lab 12/08/16 1212 12/09/16 0351 12/10/16 0348  WBC 8.8 6.4 8.0  NEUTROABS 7.3  --   --   HGB 11.2* 9.7* 10.0*  HCT 33.9* 29.5* 30.6*  MCV 72.4* 72.1* 71.8*  PLT 250 248 314   Basic Metabolic Panel:  Recent Labs Lab 12/08/16 1212 12/09/16 0351 12/10/16 0348  NA 140 140 139  K 4.1 3.6 3.5  CL 106 108 108  CO2 22 24 22   GLUCOSE 316* 320* 191*  BUN 30* 25* 17  CREATININE 1.40* 1.36* 1.25*    CALCIUM 8.9 8.3* 8.2*   GFR: Estimated Creatinine Clearance: 59.9 mL/min (A) (by C-G formula based on SCr of 1.25 mg/dL (H)). Liver Function Tests:  Recent Labs Lab 12/08/16 1212 12/09/16 0351  AST 14* 10*  ALT 19 14*  ALKPHOS 191* 136*  BILITOT 1.0 0.8  PROT 7.6 6.5  ALBUMIN 2.9* 2.5*    Recent Labs Lab 12/08/16 1212  LIPASE 53*   No results for input(s): AMMONIA in the last 168 hours. Coagulation Profile:  Recent Labs Lab 12/08/16 1212 12/09/16 1020  INR 1.84 2.34   Cardiac Enzymes: No results for input(s): CKTOTAL, CKMB, CKMBINDEX, TROPONINI in the last 168 hours. BNP (last 3 results) No results for input(s): PROBNP in the last 8760 hours. HbA1C: No results for input(s): HGBA1C in the last 72 hours. CBG:  Recent Labs Lab 12/09/16 2001 12/09/16 2027 12/09/16 2055 12/09/16 2210 12/10/16 0809  GLUCAP 65 60* 135* 95 195*   Lipid Profile: No results for input(s): CHOL, HDL, LDLCALC, TRIG, CHOLHDL, LDLDIRECT in the last 72 hours. Thyroid Function Tests: No  results for input(s): TSH, T4TOTAL, FREET4, T3FREE, THYROIDAB in the last 72 hours. Anemia Panel: No results for input(s): VITAMINB12, FOLATE, FERRITIN, TIBC, IRON, RETICCTPCT in the last 72 hours. Urine analysis:    Component Value Date/Time   COLORURINE AMBER (A) 12/08/2016 1131   APPEARANCEUR TURBID (A) 12/08/2016 1131   LABSPEC 1.015 12/08/2016 1131   PHURINE 5.0 12/08/2016 1131   GLUCOSEU 150 (A) 12/08/2016 1131   HGBUR MODERATE (A) 12/08/2016 1131   BILIRUBINUR NEGATIVE 12/08/2016 1131   KETONESUR 5 (A) 12/08/2016 1131   PROTEINUR 100 (A) 12/08/2016 1131   UROBILINOGEN 0.2 01/11/2015 1850   NITRITE NEGATIVE 12/08/2016 1131   LEUKOCYTESUR LARGE (A) 12/08/2016 1131   Sepsis Labs: @LABRCNTIP (procalcitonin:4,lacticidven:4)  ) Recent Results (from the past 240 hour(s))  Body fluid culture     Status: None (Preliminary result)   Collection Time: 12/08/16  6:24 PM  Result Value Ref Range  Status   Specimen Description BILE  Final   Special Requests NONE  Final   Gram Stain   Final    ABUNDANT WBC PRESENT,BOTH PMN AND MONONUCLEAR MODERATE GRAM NEGATIVE RODS    Culture   Final    GRAM NEGATIVE RODS IDENTIFICATION AND SUSCEPTIBILITIES TO FOLLOW Performed at [facility], [place] [address], [place], [place] [address]    Report Status PENDING  Incomplete  MRSA PCR Screening     Status: Abnormal   Collection Time: 12/08/16  6:51 PM  Result Value Ref Range Status   MRSA by PCR POSITIVE (A) NEGATIVE Final    Comment:        The GeneXpert MRSA Assay (FDA approved for NASAL specimens only), is one component of a comprehensive MRSA colonization surveillance program. It is not intended to diagnose MRSA infection nor to guide or monitor treatment for MRSA infections. CRITICAL RESULT CALLED TO, READ BACK BY AND VERIFIED WITH: B CHERRY RN 0045 12/09/16 A NAVARRO   C difficile quick scan w PCR reflex     Status: Abnormal   Collection Time: 12/09/16  7:47 PM  Result Value Ref Range Status   C Diff antigen POSITIVE (A) NEGATIVE Final   C Diff toxin POSITIVE (A) NEGATIVE Final    Comment: CRITICAL RESULT CALLED TO, READ BACK BY AND VERIFIED WITH: B.CHERRY AT 2224 12/09/16 BY N.THOMPSON    C Diff interpretation Toxin producing C. difficile detected.  Final    Comment: CRITICAL RESULT CALLED TO, READ BACK BY AND VERIFIED WITH: B.CHERRY AT 2224 12/09/16 BY N.THOMPSON       Radiology Studies: Ct Abdomen Pelvis W Contrast  Result Date: 12/08/2016 CLINICAL DATA:  Generalized weakness. History of cholecystostomy tube. EXAM: CT ABDOMEN AND PELVIS WITH CONTRAST TECHNIQUE: Multidetector CT imaging of the abdomen and pelvis was performed using the standard protocol following bolus administration of intravenous contrast. CONTRAST:  154mL ISOVUE-300 IOPAMIDOL (ISOVUE-300) INJECTION 61% COMPARISON:  10/17/2016 FINDINGS: Lower chest:  Bibasilar dependent atelectasis. Hepatobiliary: 13  mm subcapsular lesion lateral segment left liver with hyper perfusion in the surrounding parenchyma. This appears progressed since prior study. 13 mm hypervascular lesion noted in the hepatic dome (image 15 series 2). The cholecystostomy tube visualized with loop formed in the region of the gallbladder neck. Gas in the gallbladder lumen is compatible with the presence of the drain. Insert no biliary Pancreas: 2.1 x 2.5 cm lesion identified in the body the pancreas with markedly atrophic pancreatic tail and dilated duct in the tail the pancreas that terminates at the level of the mass.  Spleen: No splenomegaly. No focal mass lesion. Adrenals/Urinary Tract: No adrenal nodule or mass. Kidneys unremarkable. No evidence for hydroureter. Left bladder wall obscured by streak artifact from hip replacement. Stomach/Bowel: Stomach is nondistended. No gastric wall thickening. No evidence of outlet obstruction. Duodenum is normally positioned as is the ligament of Treitz. No small bowel wall thickening. No small bowel dilatation. The terminal ileum is normal. The appendix is normal. Irregular wall thickening in the hepatic flexure likely secondary to the gallbladder process. Transverse and descending colon unremarkable. Diverticular changes noted sigmoid colon circumferential wall thickening is seen in the rectum and there is a 1.6 cm rim enhancing structure just posterior to the anal rectal junction. Vascular/Lymphatic: There is abdominal aortic atherosclerosis without aneurysm. There is no gastrohepatic or hepatoduodenal ligament lymphadenopathy. No intraperitoneal or retroperitoneal lymphadenopathy. No pelvic sidewall lymphadenopathy. Reproductive: The prostate gland and seminal vesicles have normal imaging features. Other: No intraperitoneal free fluid. Musculoskeletal: Status post left total hip replacement. Bone windows reveal no worrisome lytic or sclerotic osseous lesions. IMPRESSION: 1. 2.5 cm pancreatic body lesion  obstructs the main pancreatic duct in the tail the pancreas generating marked pancreatic tail atrophy. Imaging features highly suspicious for pancreatic neoplasm and adenocarcinoma a distinct concern. 2. Hypervascular liver lesions raise concern for metastatic disease given findings described immediately above. 3. Circumferential wall thickening in the distal rectum with a small rim enhancing focus just posterior to the anal verge. Perirectal abscess could have this appearance. 4. Cholecystostomy tube with irregular gallbladder wall thickening. Pericholecystic stranding is associated with thickening of the colonic wall in the region the hepatic flexure, likely secondary to the primary gallbladder process. 5.  Abdominal Aortic Atherosclerois (ICD10-170.0) Results Were called by me at the time of interpretation on 12/08/2016 at 4:22 pm to Dr. Margarita Mail , who verbally acknowledged these results. Electronically Signed   By: Misty Stanley M.D.   On: 12/08/2016 16:23   Ir Catheter Tube Change  Result Date: 12/09/2016 INDICATION: 71 year old with multiple medical problems including a cholecystostomy tube. Cholecystostomy tube was reportedly leaking. Examination of the cholecystostomy tube demonstrated that the tube had been cut and only a small portion of the tube was sticking out of the skin. Arrangements were made for cholecystostomy tube replacement. EXAM: EXCHANGE OF CHOLECYSTOSTOMY TUBE WITH FLUOROSCOPY MEDICATIONS: None ANESTHESIA/SEDATION: None FLUOROSCOPY TIME:  42 seconds, 09.3 mGy COMPLICATIONS: None immediate. PROCEDURE: Informed written consent was obtained from the patient and wife after a thorough discussion of the procedural risks, benefits and alternatives. All questions were addressed. Maximal Sterile Barrier Technique was utilized including caps, mask, sterile gowns, sterile gloves, sterile drape, hand hygiene and skin antiseptic. A timeout was performed prior to the initiation of the procedure.  The remaining cholecystostomy tube and surrounding skin were prepped and draped in a sterile fashion. A Bentson wire was advanced through the tube and into the gallbladder with fluoroscopic guidance. The old tube was removed. A new 10.2 [place] multipurpose drain was easily advanced into the gallbladder with fluoroscopic guidance. Contrast injection confirmed placement in the gallbladder. Skin was anesthetized with 1% lidocaine. Catheter was sutured to the skin. FINDINGS: The old cholecystostomy tube was cut and only a small portion was sticking out of the skin. New tube was positioned in the gallbladder. There is filling of the cystic duct but no filling of the common bile duct. The aspirated bile from the gallbladder is very cloudy and concern for purulent or infected material. Fluid was sent for culture. IMPRESSION: Successful exchange of the cholecystostomy  tube with fluoroscopy. Bile was sent for culture. Electronically Signed   By: Markus Daft M.D.   On: 12/09/2016 09:30     Scheduled Meds: . carvedilol  6.25 mg Oral BID WC  . Chlorhexidine Gluconate Cloth  6 each Topical Q0600  . insulin aspart  0-5 Units Subcutaneous QHS  . insulin aspart  0-9 Units Subcutaneous TID WC  . insulin detemir  11 Units Subcutaneous BID  . mupirocin ointment  1 application Nasal BID  . pantoprazole  40 mg Oral BID  . saccharomyces boulardii  250 mg Oral BID  . sacubitril-valsartan  1 tablet Oral BID  . spironolactone  25 mg Oral Daily  . vancomycin  125 mg Oral QID   Continuous Infusions: . piperacillin-tazobactam (ZOSYN)  IV       LOS: 2 days   Time Spent in minutes   30 minutes  Naythen Heikkila D.O. on 12/10/2016 at 12:07 PM  Between 7am to 7pm - Pager - [phone]  After 7pm go to [email or web address] - password [email or web address]  And look for the night coverage person covering for me after hours  [facility] Office  [phone]

## 2016-12-10 NOTE — Progress Notes (Signed)
[facility] Gastroenterology Progress Note  PARNELL SPIELER 71 y.o. 04/27/1946   Subjective: Patient states that he has 2 loose bowel movements.today and about 3 last night. The stools are still loose but there is improvement in the frequency of bowel movements. He has mild discomfort in the area of insertion of cholecystomy tube but denies nausea, vomiting or fever.  Objective: Vital signs in last 24 hours: Vitals:   12/10/16 0834 12/10/16 1100  BP: 132/70 103/62  Pulse:    Resp:    Temp:      Physical Exam: Gen: alert, no acute distress Abd: Cholecystostomy catheter in place, draining small amount of green bile, small umbilical hernia, soft, non distended, non tender, hyperactive bowel sounds  Lab Results:  Recent Labs  12/09/16 0351 12/10/16 0348  NA 140 139  K 3.6 3.5  CL 108 108  CO2 24 22  GLUCOSE 320* 191*  BUN 25* 17  CREATININE 1.36* 1.25*  CALCIUM 8.3* 8.2*    Recent Labs  12/08/16 1212 12/09/16 0351  AST 14* 10*  ALT 19 14*  ALKPHOS 191* 136*  BILITOT 1.0 0.8  PROT 7.6 6.5  ALBUMIN 2.9* 2.5*    Recent Labs  12/08/16 1212 12/09/16 0351 12/10/16 0348  WBC 8.8 6.4 8.0  NEUTROABS 7.3  --   --   HGB 11.2* 9.7* 10.0*  HCT 33.9* 29.5* 30.6*  MCV 72.4* 72.1* 71.8*  PLT 250 248 280    Recent Labs  12/08/16 1212 12/09/16 1020  LABPROT 21.5* 26.1*  INR 1.84 2.34      Assessment/Plan: 1. Pancreatic mass with liver metastasis. If INR improves plan for EUS and FNAC on 4/19 or 4/20. S/p cholecystostomy tube placement, ALP minimally elevated with normal TB. Oncology consult appreciated.  2. Clostridium difficile diarrhea Started on Vancomycin 125 mg PO every 6 hours, patient continues to be on Zosyn (Gram negative rods noted in culture from bile). No features of severe infection or colitis.  WBC was norma,l but renal function and albumin were deranged.  3. Atrial Fibrillation, coumadin on hold for possible EUS. 4. DM 5. Chronic Systolic  CHF, CMP EF 19-41%, s/p AICD placement 6. Hypertension 7. Dyslipidemia        Ronnette Juniper 12/10/2016, 2:07 PM

## 2016-12-10 NOTE — Significant Event (Signed)
Hypoglycemic Event  CBG: 60  Treatment: D50 IV 25 mL  Symptoms: None  Follow-up CBG: Time:2055 CBG Result:135  Possible Reasons for Event: Inadequate meal intake  Comments/MD notified:Katherine Schorr,NP    Luana Shu, Dorothyann Peng

## 2016-12-11 ENCOUNTER — Inpatient Hospital Stay (HOSPITAL_COMMUNITY): Admission: RE | Admit: 2016-12-11 | Payer: Medicare Other | Source: Ambulatory Visit

## 2016-12-11 LAB — GLUCOSE, CAPILLARY
GLUCOSE-CAPILLARY: 186 mg/dL — AB (ref 65–99)
GLUCOSE-CAPILLARY: 301 mg/dL — AB (ref 65–99)
GLUCOSE-CAPILLARY: 266 mg/dL — AB (ref 65–99)
Glucose-Capillary: 194 mg/dL — ABNORMAL HIGH (ref 65–99)

## 2016-12-11 LAB — BASIC METABOLIC PANEL
ANION GAP: 8 (ref 5–15)
BUN: 16 mg/dL (ref 6–20)
CHLORIDE: 107 mmol/L (ref 101–111)
CO2: 23 mmol/L (ref 22–32)
Calcium: 8.2 mg/dL — ABNORMAL LOW (ref 8.9–10.3)
Creatinine, Ser: 1.33 mg/dL — ABNORMAL HIGH (ref 0.61–1.24)
GFR calc non Af Amer: 51 mL/min — ABNORMAL LOW (ref >60)
GFR, EST AFRICAN AMERICAN: 60 mL/min — AB (ref >60)
Glucose, Bld: 211 mg/dL — ABNORMAL HIGH (ref 65–99)
Potassium: 3.5 mmol/L (ref 3.5–5.1)
Sodium: 138 mmol/L (ref 135–145)

## 2016-12-11 LAB — CBC
HCT: 28.8 % — ABNORMAL LOW (ref 39.0–52.0)
Hemoglobin: 9.6 g/dL — ABNORMAL LOW (ref 13.0–17.0)
MCH: 23.9 pg — ABNORMAL LOW (ref 26.0–34.0)
MCHC: 33.3 g/dL (ref 30.0–36.0)
MCV: 71.6 fL — AB (ref 78.0–100.0)
Platelets: 274 10*3/uL (ref 150–400)
RBC: 4.02 MIL/uL — AB (ref 4.22–5.81)
RDW: 18.3 % — AB (ref 11.5–15.5)
WBC: 6.2 10*3/uL (ref 4.0–10.5)

## 2016-12-11 LAB — PROTIME-INR
INR: 1.14
Prothrombin Time: 14.6 seconds (ref 11.4–15.2)

## 2016-12-11 NOTE — Progress Notes (Signed)
PROGRESS NOTE    [name] [name]  JJH:[id number] DOB: 1945-09-28 DOA: 12/08/2016 PCP: [name], MD   Chief Complaint  Patient presents with  . Weakness  . Drainage from Incision    GALLBLADDER STENT    Brief Narrative:  HPI on 12/08/2016 by Dr. Eston Mould [name] [name]  is a 71 y.o. male, with medical history significant of NICM status post ICD, CHFwith EF to 20-25% in 2014, DM-2, CKD, PAF on coumadin, bilateral BKA , with recent hospitalization secondary to cholecystitis, where patient discharge with cholecystostomy tube, patient presents with progressive weakness, failure to thrive, no appetite weight loss, intermittent fever as well secondary to malfunction and cholecystomy drain as bag has been disconnected, patient is a febrile NAD, with no leukocytosis, but CT abdomen and pelvis showing evidence of pancreatic mass or pancreatic ductal obstruction,, with possible liver metastases, and abnormal finding of perirectal area suspicious for abscess, patient denies any chest pain, shortness of breath, dysuria, polyuria, cough or productive sputum, wife reports he is with progressive weakness, sickly looking on the provided entire weekend, with no appetite and oral intake, I was called to admit. 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 Recommended EUS on 4/19 or 4/20, given that INR will improve with Vitamin K -General surgery consulted and appreciated, however patient not a surgical candidate for resection of Whipple  -Interventional radiology consulted and appreciated, drain was exchanged  -Oncology consulted and appreciated, pending biospy. If biopsy shows metastatic panc ca, palliative care consult would be appropriate -[id number]   Failure to thrive, progressive weakness -Likely secondary to  the above -PT consulted recommended HH  Cholecystitis -Chronic, status post cholecystostomy, tube was broken and leaking -IR consulted, s/p exchange on 12/08/16   -Body fluid culture: GNR -Placed on Zosyn  Atrial fibrillation -Currently rate controlled -Coumadin held for possible biopsy  -continue Coreg -CHADSVASC 4 (based on age, CHF, HTN, DM)  C Difficile -C diff positive antigen and toxin -Continue PO vancomycin and florastor -Feels diarrhea has improved  Bilateral renal knee amputation -PT consulted and recommended HH  Abormal finding in CT imaging of the rectal area -Gen. surgery input greatly appreciated, no evidence of perirectal abscess.  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% status post AICD placement -Last Echocardiogram 10/14/16: EF 20-25%, diffuse hypokinesis    -continue Coreg, Aldactone, Entresto -Lasix held -Continue to monitor intake/output, daily weights -Currently appears to be euvolemic   Hyperlipidemia -Statin held   Hypertension -BP currently stable -Continue Coreg, Aldactone, Entresto   DVT Prophylaxis  SCDs  Code Status: Full  Family Communication: None at bedside  Disposition Plan: Admitted, pending EUS- on 12/12/16  Consultants Gastroenterology Interventional radiology Oncology  General Surgery  Procedures  Exchange of perc chole drain (secondary to leakage) by IR  Antibiotics   Anti-infectives    Start     Dose/Rate Route Frequency Ordered Stop   12/10/16 1000  vancomycin (VANCOCIN) 50 mg/mL oral solution 125 mg     125 mg Oral 4 times daily 12/10/16 0331 12/24/16 0959   12/09/16 0000  piperacillin-tazobactam (ZOSYN) IVPB  3.375  g     3.375 g 12.5 mL/hr over 240 Minutes Intravenous Every 8 hours 12/08/16 1808     12/08/16 1730  piperacillin-tazobactam (ZOSYN) IVPB 3.375 g     3.375 g 100 mL/hr over 30 Minutes Intravenous STAT 12/08/16 1722 12/08/16 1810      Subjective:   [name] [name] seen and examined today. Currently denies chest pain, shortness of breath, abdominal pain. Feels mildly better today. Feels diarrhea is improving, only had 2 episodes yesterday. Waiting on his biopsy.   Objective:   Vitals:   12/10/16 1438 12/10/16 2252 12/11/16 0501 12/11/16 0800  BP: (!) 105/55 (!) 105/55 138/65 (!) 134/58  Pulse: 92 84 87 92  Resp: 16 20 20  (!) 22  Temp: 97.8 F (36.6 C) 97.4 F (36.3 C) 98.3 F (36.8 C) 98.1 F (36.7 C)  TempSrc: Oral Oral Oral Oral  SpO2: 99%  98% 98%  Weight:      Height:        Intake/Output Summary (Last 24 hours) at 12/11/16 1204 Last data filed at 12/11/16 0955  Gross per 24 hour  Intake           854.58 ml  Output              400 ml  Net           454.58 ml   Filed Weights   12/08/16 1130  Weight: 95.3 kg (210 lb)    Exam  General: Well developed, well nourished, NAD, appears stated age  HEENT: NCAT, mucous membranes moist.   Cardiovascular: S1 S2 auscultated, RRR, no murmur  Respiratory: Clear to auscultation bilaterally   Abdomen: Soft, mild epigastric TTP, nondistended, + bowel sounds, +cholecystectomy tube with drainage  Extremities: B/L BKA  Neuro: AAOx3, nonfocal  Psych: Normal affect and demeanor, pleasant   Data Reviewed: I have personally reviewed following labs and imaging studies  CBC:  Recent Labs Lab 12/08/16 1212 12/09/16 0351 12/10/16 0348 12/11/16 0414  WBC 8.8 6.4 8.0 6.2  NEUTROABS 7.3  --   --   --   HGB 11.2* 9.7* 10.0* 9.6*  HCT 33.9* 29.5* 30.6* 28.8*  MCV 72.4* 72.1* 71.8* 71.6*  PLT 250 248 280 263   Basic Metabolic Panel:  Recent Labs Lab 12/08/16 1212 12/09/16 0351 12/10/16 0348 12/11/16 0414    NA 140 140 139 138  K 4.1 3.6 3.5 3.5  CL 106 108 108 107  CO2 22 24 22 23   GLUCOSE 316* 320* 191* 211*  BUN 30* 25* 17 16  CREATININE 1.40* 1.36* 1.25* 1.33*  CALCIUM 8.9 8.3* 8.2* 8.2*   GFR: Estimated Creatinine Clearance: 56.3 mL/min (A) (by C-G formula based on SCr of 1.33 mg/dL (H)). Liver Function Tests:  Recent Labs Lab 12/08/16 1212 12/09/16 0351  AST 14* 10*  ALT 19 14*  ALKPHOS 191* 136*  BILITOT 1.0 0.8  PROT 7.6 6.5  ALBUMIN 2.9* 2.5*    Recent Labs Lab 12/08/16 1212  LIPASE 53*   No results for input(s): AMMONIA in the last 168 hours. Coagulation Profile:  Recent Labs Lab 12/08/16 1212 12/09/16 1020 12/10/16 1351 12/11/16 0414  INR 1.84 2.34 1.09 1.14   Cardiac Enzymes: No results for input(s): CKTOTAL, CKMB, CKMBINDEX, TROPONINI in the last 168 hours. BNP (last 3 results) No results for input(s): PROBNP in the last 8760 hours. HbA1C: No results for input(s): HGBA1C in the last 72 hours. CBG:  Recent Labs Lab 12/09/16 2210 12/10/16 0809 12/10/16 1225 12/10/16  1741 12/11/16 0803  GLUCAP 95 195* 203* 274* 186*   Lipid Profile: No results for input(s): CHOL, HDL, LDLCALC, TRIG, CHOLHDL, LDLDIRECT in the last 72 hours. Thyroid Function Tests: No results for input(s): TSH, T4TOTAL, FREET4, T3FREE, THYROIDAB in the last 72 hours. Anemia Panel: No results for input(s): VITAMINB12, FOLATE, FERRITIN, TIBC, IRON, RETICCTPCT in the last 72 hours. Urine analysis:    Component Value Date/Time   COLORURINE AMBER (A) 12/08/2016 1131   APPEARANCEUR TURBID (A) 12/08/2016 1131   LABSPEC 1.015 12/08/2016 1131   PHURINE 5.0 12/08/2016 1131   GLUCOSEU 150 (A) 12/08/2016 1131   HGBUR MODERATE (A) 12/08/2016 1131   BILIRUBINUR NEGATIVE 12/08/2016 1131   KETONESUR 5 (A) 12/08/2016 1131   PROTEINUR 100 (A) 12/08/2016 1131   UROBILINOGEN 0.2 01/11/2015 1850   NITRITE NEGATIVE 12/08/2016 1131   LEUKOCYTESUR LARGE (A) 12/08/2016 1131   Sepsis  Labs: @LABRCNTIP (procalcitonin:4,lacticidven:4)  ) Recent Results (from the past 240 hour(s))  Body fluid culture     Status: Abnormal (Preliminary result)   Collection Time: 12/08/16  6:24 PM  Result Value Ref Range Status   Specimen Description BILE  Final   Special Requests NONE  Final   Gram Stain   Final    ABUNDANT WBC PRESENT,BOTH PMN AND MONONUCLEAR MODERATE GRAM NEGATIVE RODS    Culture (A)  Final    ESCHERICHIA COLI repeating sensitivities Performed at [facility], [place] [address], [place], [place] [address]    Report Status PENDING  Incomplete  MRSA PCR Screening     Status: Abnormal   Collection Time: 12/08/16  6:51 PM  Result Value Ref Range Status   MRSA by PCR POSITIVE (A) NEGATIVE Final    Comment:        The GeneXpert MRSA Assay (FDA approved for NASAL specimens only), is one component of a comprehensive MRSA colonization surveillance program. It is not intended to diagnose MRSA infection nor to guide or monitor treatment for MRSA infections. CRITICAL RESULT CALLED TO, READ BACK BY AND VERIFIED WITH: B CHERRY RN 0045 12/09/16 A NAVARRO   C difficile quick scan w PCR reflex     Status: Abnormal   Collection Time: 12/09/16  7:47 PM  Result Value Ref Range Status   C Diff antigen POSITIVE (A) NEGATIVE Final   C Diff toxin POSITIVE (A) NEGATIVE Final    Comment: CRITICAL RESULT CALLED TO, READ BACK BY AND VERIFIED WITH: B.CHERRY AT 2224 12/09/16 BY N.THOMPSON    C Diff interpretation Toxin producing C. difficile detected.  Final    Comment: CRITICAL RESULT CALLED TO, READ BACK BY AND VERIFIED WITH: B.CHERRY AT 2224 12/09/16 BY N.THOMPSON       Radiology Studies: No results found.   Scheduled Meds: . carvedilol  6.25 mg Oral BID WC  . Chlorhexidine Gluconate Cloth  6 each Topical Q0600  . insulin aspart  0-5 Units Subcutaneous QHS  . insulin aspart  0-9 Units Subcutaneous TID WC  . insulin detemir  11 Units Subcutaneous BID  . mupirocin  ointment  1 application Nasal BID  . pantoprazole  40 mg Oral BID  . saccharomyces boulardii  250 mg Oral BID  . sacubitril-valsartan  1 tablet Oral BID  . spironolactone  25 mg Oral Daily  . vancomycin  125 mg Oral QID   Continuous Infusions: . piperacillin-tazobactam (ZOSYN)  IV       LOS: 3 days   Time Spent in minutes   30 minutes  Kamori Barbier,  Britt Petroni D.O. on 12/11/2016 at 12:04 PM  Between 7am to 7pm - Pager - [phone]  After 7pm go to [email or web address] - password [email or web address]  And look for the night coverage person covering for me after hours  [facility] Office  [phone]

## 2016-12-11 NOTE — Progress Notes (Signed)
Physical Therapy Treatment Patient Details Name: [name] [name] MRN: [id number] DOB: [birth year] Today's Date: 12/11/2016    History of Present Illness [name]  is a 71 y.o. male, with medical history significant of NICM status post ICD, CHF with EF to 20-25% in 2014, DM-2, CKD, PAF on coumadin, bilateral BKA , with recent hospitalization secondary to cholecystitis, discharged with cholecystostomy tube, patient presents 12/08/16  with progressive weakness, failure to thrive, no appetite weight loss, intermittent fever as well secondary to malfunction and cholecystomy drain as bag has been disconnected, patient is a febrile, but CT abdomen and pelvis showing evidence of pancreatic mass or pancreatic ductal obstruction,, with possible liver metastases, and abnormal finding of perirectal area suspicious for abscess    PT Comments    Pt sitting EOB Indep with B prosthesis on and 2 family members in room.  Assisted with amb a greater distance in hallway.  Pt feeling better but slightly weak.  Progressing and plans to return home with wife.  Follow Up Recommendations  Home health PT;Supervision/Assistance - 24 hour;SNF     Equipment Recommendations  None recommended by PT    Recommendations for Other Services       Precautions / Restrictions Precautions Precaution Comments: right drain from back Other Brace/Splint: bil BKA prosthetics Restrictions Weight Bearing Restrictions: No Other Position/Activity Restrictions: WBAT    Mobility  Bed Mobility               General bed mobility comments: EOB Indep on arrival with family in room  Transfers Overall transfer level: Needs assistance Equipment used: Rolling walker (2 wheeled) Transfers: Sit to/from Stand Sit to Stand: Min assist;+2 safety/equipment;From elevated surface         General transfer comment: + 2 assist for safety but pt able to rise from elevated bed with forward lean and excessive push up from his  walker.   Ambulation/Gait Ambulation/Gait assistance: Supervision;Min guard;+2 safety/equipment Ambulation Distance (Feet): 58 Feet Assistive device: Rolling walker (2 wheeled) Gait Pattern/deviations: Step-through pattern Gait velocity: WFL   General Gait Details: tolerated an an increased distance with family member following with recliner as a precaution.  Good alternating gait.  Good upper body strength.    Stairs            Wheelchair Mobility    Modified Rankin (Stroke Patients Only)       Balance                                            Cognition Arousal/Alertness: Awake/alert Behavior During Therapy: WFL for tasks assessed/performed Overall Cognitive Status: Within Functional Limits for tasks assessed                                        Exercises      General Comments        Pertinent Vitals/Pain Pain Assessment: No/denies pain    Home Living                      Prior Function            PT Goals (current goals can now be found in the care plan section) Progress towards PT goals: Progressing toward goals    Frequency    Min 3X/week  PT Plan Current plan remains appropriate    Co-evaluation             End of Session Equipment Utilized During Treatment: Gait belt Activity Tolerance: Patient tolerated treatment well Patient left: in bed;with call bell/phone within reach;with bed alarm set;with family/visitor present Nurse Communication: Mobility status PT Visit Diagnosis: Difficulty in walking, not elsewhere classified (R26.2)     Time: 4650-3546 PT Time Calculation (min) (ACUTE ONLY): 17 min  Charges:  $Gait Training: 8-22 mins                    G Codes:       Rica Koyanagi  PTA [facility]  Acute  Rehab Pager      [phone]

## 2016-12-11 NOTE — Progress Notes (Signed)
Nutrition Follow-up  DOCUMENTATION CODES:   Obesity unspecified  INTERVENTION:   Diet advancement per MD Will continue to monitor for needs  NUTRITION DIAGNOSIS:   Increased nutrient needs related to cancer and cancer related treatments as evidenced by estimated needs.  Ongoing.  GOAL:   Patient will meet greater than or equal to 90% of their needs  Progressing.  MONITOR:   Diet advancement, Labs, Weight trends, I & O's (GOC)  REASON FOR ASSESSMENT:   Malnutrition Screening Tool    ASSESSMENT:    71 y.o. 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 He is s/p a cholecystostomy tube in February that was replaced yesterday. He denies abdominal pain, nausea, and vomiting. Has been losing weight but unable to tell me how much. ALP mildly elevated at 136 and other liver enzymes are nromal. On chronic Coumadin. S/P bilateral BKA.   Pt with 75% meal completion this morning of pancakes, sausage, grits and OJ. Pt now on clear liquids for pending EUS.  Will continue to monitor PO intakes and diet advancements.   Medications: Florastor BID Labs reviewed; CBGs: 186-301   Diet Order:  Diet clear liquid Room service appropriate? Yes; Fluid consistency: Thin Diet NPO time specified  Skin:  Reviewed, no issues  Last BM:  4/19  Height:   Ht Readings from Last 1 Encounters:  12/08/16 5\' 9"  (1.753 m)    Weight:   Wt Readings from Last 1 Encounters:  12/08/16 210 lb (95.3 kg)    Ideal Body Weight:  64 kg (adjusted for bilateral BKA)  BMI:  Body mass index is 31.01 kg/m.  Estimated Nutritional Needs:   Kcal:  1900-2100  Protein:  90-100g  Fluid:  2L/day  EDUCATION NEEDS:   No education needs identified at this time  Clayton Bibles, MS, RD, LDN Pager: [phone] After Hours Pager: [phone]

## 2016-12-11 NOTE — Progress Notes (Signed)
Patient ID: [name] [name], male   DOB: Jan 28, 1946, 71 y.o.   MRN: [id number]  Sitting up on side of bed with nursing staff eating clear liquid diet. Denies abdominal pain.   EUS unable to be done tomorrow due to lack of availability in Endoscopy unit at [facility] or [facility]. D/W Dr. Ree Kida and since oncology awaiting biopsy results and due to difficult logistics will keep him as an inpt until EUS and biopsy can be done. Tentatively plan for early next week to do EUS by Dr. Paulita Fujita. Will advance diet in the interim. D/W patient.

## 2016-12-12 ENCOUNTER — Ambulatory Visit (INDEPENDENT_AMBULATORY_CARE_PROVIDER_SITE_OTHER): Payer: Medicare Other | Admitting: *Deleted

## 2016-12-12 DIAGNOSIS — I420 Dilated cardiomyopathy: Secondary | ICD-10-CM

## 2016-12-12 LAB — BASIC METABOLIC PANEL
ANION GAP: 7 (ref 5–15)
Anion gap: 10 (ref 5–15)
BUN: 14 mg/dL (ref 6–20)
BUN: 13 mg/dL (ref 6–20)
CHLORIDE: 107 mmol/L (ref 101–111)
CHLORIDE: 107 mmol/L (ref 101–111)
CO2: 21 mmol/L — AB (ref 22–32)
CO2: 23 mmol/L (ref 22–32)
CREATININE: 1.27 mg/dL — AB (ref 0.61–1.24)
Calcium: 8.2 mg/dL — ABNORMAL LOW (ref 8.9–10.3)
Calcium: 8.2 mg/dL — ABNORMAL LOW (ref 8.9–10.3)
Creatinine, Ser: 1.1 mg/dL (ref 0.61–1.24)
GFR calc Af Amer: >60 mL/min (ref >60)
GFR calc non Af Amer: 54 mL/min — ABNORMAL LOW (ref >60)
GFR calc non Af Amer: >60 mL/min (ref >60)
Glucose, Bld: 154 mg/dL — ABNORMAL HIGH (ref 65–99)
Glucose, Bld: 217 mg/dL — ABNORMAL HIGH (ref 65–99)
POTASSIUM: 4.1 mmol/L (ref 3.5–5.1)
Potassium: 3.6 mmol/L (ref 3.5–5.1)
SODIUM: 137 mmol/L (ref 135–145)
Sodium: 138 mmol/L (ref 135–145)

## 2016-12-12 LAB — CUP PACEART REMOTE DEVICE CHECK
Battery Remaining Percentage: 53 %
Brady Statistic RA Percent Paced: 0 %
Brady Statistic RV Percent Paced: 93 %
HighPow Impedance: 43 Ohm
Implantable Lead Location: 753858
Implantable Lead Location: 753859
Implantable Lead Model: 158
Implantable Lead Model: 4513
Implantable Lead Model: 5076
Implantable Lead Serial Number: 406736
Lead Channel Impedance Value: 439 Ohm
Lead Channel Impedance Value: 481 Ohm
Lead Channel Impedance Value: 538 Ohm
Lead Channel Pacing Threshold Amplitude: 0.5 V
Lead Channel Pacing Threshold Amplitude: 0.6 V
Lead Channel Pacing Threshold Amplitude: 1.4 V
Lead Channel Setting Pacing Amplitude: 2.4 V
Lead Channel Setting Pacing Amplitude: 2.4 V
Lead Channel Setting Pacing Pulse Width: 0.4 ms
Lead Channel Setting Sensing Sensitivity: 0.5 mV
Lead Channel Setting Sensing Sensitivity: 1 mV
MDC IDC LEAD IMPLANT DT: 20050429
MDC IDC LEAD IMPLANT DT: 20050429
MDC IDC LEAD IMPLANT DT: 20050429
MDC IDC LEAD LOCATION: 753860
MDC IDC LEAD SERIAL: 154821
MDC IDC MSMT BATTERY REMAINING LONGEVITY: 30 mo
MDC IDC MSMT LEADCHNL LV PACING THRESHOLD PULSEWIDTH: 0.4 ms
MDC IDC MSMT LEADCHNL RA PACING THRESHOLD PULSEWIDTH: 0.4 ms
MDC IDC MSMT LEADCHNL RV PACING THRESHOLD PULSEWIDTH: 0.4 ms
MDC IDC PG IMPLANT DT: 20101111
MDC IDC PG SERIAL: 587847
MDC IDC SESS DTM: 20180416141900
MDC IDC SET LEADCHNL LV PACING PULSEWIDTH: 0.4 ms
MDC IDC SET LEADCHNL RA PACING AMPLITUDE: 2 V

## 2016-12-12 LAB — BODY FLUID CULTURE

## 2016-12-12 LAB — CBC
HEMATOCRIT: 28.4 % — AB (ref 39.0–52.0)
HEMOGLOBIN: 9.3 g/dL — AB (ref 13.0–17.0)
MCH: 23.7 pg — ABNORMAL LOW (ref 26.0–34.0)
MCHC: 32.7 g/dL (ref 30.0–36.0)
MCV: 72.3 fL — ABNORMAL LOW (ref 78.0–100.0)
Platelets: 264 10*3/uL (ref 150–400)
RBC: 3.93 MIL/uL — ABNORMAL LOW (ref 4.22–5.81)
RDW: 18.2 % — AB (ref 11.5–15.5)
WBC: 5.2 10*3/uL (ref 4.0–10.5)

## 2016-12-12 LAB — GLUCOSE, CAPILLARY
Glucose-Capillary: 128 mg/dL — ABNORMAL HIGH (ref 65–99)
Glucose-Capillary: 204 mg/dL — ABNORMAL HIGH (ref 65–99)
Glucose-Capillary: 241 mg/dL — ABNORMAL HIGH (ref 65–99)
Glucose-Capillary: 275 mg/dL — ABNORMAL HIGH (ref 65–99)

## 2016-12-12 MED ORDER — SODIUM CHLORIDE 0.9 % IV SOLN
1.0000 g | Freq: Three times a day (TID) | INTRAVENOUS | Status: DC
  Administered 2016-12-12 – 2016-12-16 (×12): 1 g via INTRAVENOUS
  Filled 2016-12-12 (×14): qty 1

## 2016-12-12 MED ORDER — ENOXAPARIN SODIUM 100 MG/ML ~~LOC~~ SOLN
1.0000 mg/kg | Freq: Two times a day (BID) | SUBCUTANEOUS | Status: AC
  Administered 2016-12-12 – 2016-12-14 (×6): 95 mg via SUBCUTANEOUS
  Filled 2016-12-12 (×6): qty 1

## 2016-12-12 NOTE — Progress Notes (Signed)
PROGRESS NOTE    [name] [name]  GBT:[id number] DOB: August 13, 1946 DOA: 12/08/2016 PCP: [name], MD   Chief Complaint  Patient presents with  . Weakness  . Drainage from Incision    GALLBLADDER STENT    Brief Narrative:  HPI on 12/08/2016 by Dr. Eston Mould Leazer  is a 71 y.o. male, with medical history significant of NICM status post ICD, CHFwith EF to 20-25% in 2014, DM-2, CKD, PAF on coumadin, bilateral BKA , with recent hospitalization secondary to cholecystitis, where patient discharge with cholecystostomy tube, patient presents with progressive weakness, failure to thrive, no appetite weight loss, intermittent fever as well secondary to malfunction and cholecystomy drain as bag has been disconnected, patient is a febrile NAD, with no leukocytosis, but CT abdomen and pelvis showing evidence of pancreatic mass or pancreatic ductal obstruction,, with possible liver metastases, and abnormal finding of perirectal area suspicious for abscess, patient denies any chest pain, shortness of breath, dysuria, polyuria, cough or productive sputum, wife reports he is with progressive weakness, sickly looking on the provided entire weekend, with no appetite and oral intake, I was called to admit. 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 EUS was recommended, however, will not be done until sometime next week.  -INR improved with vitamin K, currently 1.14 -General surgery consulted and appreciated, however patient not a surgical candidate for resection of Whipple  -Interventional radiology consulted and appreciated, drain was exchanged  -Oncology consulted and appreciated, pending EUS/biospy. If biopsy shows metastatic panc ca, palliative care consult would be appropriate -[id number]   Failure to  thrive, progressive weakness -Likely secondary to the above -PT consulted recommended HH  Cholecystitis -Chronic, status post cholecystostomy, tube was broken and leaking -IR consulted, s/p exchange on 12/08/16   -Body fluid culture: GNR- ESBL Ecoli -Initially placed on Zosyn, will transition to merrem  Atrial fibrillation -Currently rate controlled -Coumadin held for possible biopsy  -continue Coreg -CHADSVASC 4 (based on age, CHF, HTN, DM) -Given INR is now 1.14, will start on full dose lovenox  C Difficile -C diff positive antigen and toxin -Continue PO vancomycin and florastor -Diarrhea improving   Bilateral renal knee amputation -PT consulted and recommended HH -Case management consulted, orders placed  Abormal finding in CT imaging of the rectal area -Gen. surgery input greatly appreciated, no evidence of perirectal abscess.  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 Continue IV merrem for treatment of ESBL Ecoli.  Consultants Gastroenterology Interventional radiology Oncology  General Surgery  Procedures  Exchange of perc chole drain (secondary to leakage) by IR  Antibiotics   Anti-infectives    Start      Dose/Rate Route Frequency Ordered Stop  12/12/16 1200  meropenem (MERREM) 1 g in sodium chloride 0.9 % 100 mL IVPB     1 g 200 mL/hr over 30 Minutes Intravenous Every 8 hours 12/12/16 1000     12/10/16 1000  vancomycin (VANCOCIN) 50 mg/mL oral solution 125 mg     125 mg Oral 4 times daily 12/10/16 0331 12/24/16 0959   12/09/16 0000  piperacillin-tazobactam (ZOSYN) IVPB 3.375 g  Status:  Discontinued     3.375 g 12.5 mL/hr over 240 Minutes Intravenous Every 8 hours 12/08/16 1808 12/12/16 0951   12/08/16 1730  piperacillin-tazobactam (ZOSYN) IVPB 3.375 g     3.375 g 100 mL/hr over 30 Minutes Intravenous STAT 12/08/16 1722 12/08/16 1810      Subjective:   [name] [name] seen and examined today. Patient currently feels diarrhea is improving, 1-2 episodes yesterday. Denies chest pain, shortness of breath, abdominal pain, N/V/C. Does complain of pain where the tube is placed.  Objective:   Vitals:   12/11/16 1345 12/11/16 1600 12/11/16 2045 12/12/16 0434  BP: 122/60 110/62 (!) 105/56 119/64  Pulse: (!) 48 94 90 76  Resp: 20 (!) 22 20 18   Temp: 97.7 F (36.5 C) 98.4 F (36.9 C) 98.4 F (36.9 C) 98 F (36.7 C)  TempSrc: Oral Oral Oral Oral  SpO2: 100% 100% 97% 100%  Weight:      Height:        Intake/Output Summary (Last 24 hours) at 12/12/16 1230 Last data filed at 12/12/16 1000  Gross per 24 hour  Intake              970 ml  Output             1100 ml  Net             -130 ml   Filed Weights   12/08/16 1130  Weight: 95.3 kg (210 lb)    Exam (unchanged from previous days)  General: Well developed, well nourished, NAD, appears stated age  HEENT: NCAT, mucous membranes moist.   Cardiovascular: S1 S2 auscultated, RRR, no murmur  Respiratory: Clear to auscultation bilaterally   Abdomen: Soft, mild epigastric TTP, nondistended, + bowel sounds, +cholecystectomy tube with drainage  Extremities: B/L BKA  Neuro: AAOx3, nonfocal  Psych: Normal affect and  demeanor, pleasant   Data Reviewed: I have personally reviewed following labs and imaging studies  CBC:  Recent Labs Lab 12/08/16 1212 12/09/16 0351 12/10/16 0348 12/11/16 0414 12/12/16 0403  WBC 8.8 6.4 8.0 6.2 5.2  NEUTROABS 7.3  --   --   --   --   HGB 11.2* 9.7* 10.0* 9.6* 9.3*  HCT 33.9* 29.5* 30.6* 28.8* 28.4*  MCV 72.4* 72.1* 71.8* 71.6* 72.3*  PLT 250 248 280 274 735   Basic Metabolic Panel:  Recent Labs Lab 12/08/16 1212 12/09/16 0351 12/10/16 0348 12/11/16 0414 12/12/16 0403  NA 140 140 139 138 138  K 4.1 3.6 3.5 3.5 3.6  CL 106 108 108 107 107  CO2 22 24 22 23  21*  GLUCOSE 316* 320* 191* 211* 154*  BUN 30* 25* 17 16 14   CREATININE 1.40* 1.36* 1.25* 1.33* 1.27*  CALCIUM 8.9 8.3* 8.2* 8.2* 8.2*   GFR: Estimated Creatinine Clearance: 59 mL/min (A) (by C-G formula based on SCr of 1.27 mg/dL (H)). Liver Function Tests:  Recent Labs Lab 12/08/16 1212 12/09/16 0351  AST 14* 10*  ALT 19 14*  ALKPHOS 191* 136*  BILITOT 1.0 0.8  PROT 7.6 6.5  ALBUMIN 2.9* 2.5*    Recent Labs Lab 12/08/16 1212  LIPASE 53*   No results for input(s): AMMONIA in the last 168 hours. Coagulation Profile:  Recent Labs Lab 12/08/16 1212 12/09/16 1020 12/10/16 1351 12/11/16 0414  INR 1.84 2.34 1.09 1.14   Cardiac Enzymes: No results for input(s): CKTOTAL, CKMB, CKMBINDEX, TROPONINI in the last 168 hours. BNP (last 3 results) No results for input(s): PROBNP in the last 8760 hours. HbA1C: No results for input(s): HGBA1C in the last 72 hours. CBG:  Recent Labs Lab 12/11/16 1217 12/11/16 1616 12/11/16 2051 12/12/16 0811 12/12/16 1221  GLUCAP 301* 266* 194* 128* 204*   Lipid Profile: No results for input(s): CHOL, HDL, LDLCALC, TRIG, CHOLHDL, LDLDIRECT in the last 72 hours. Thyroid Function Tests: No results for input(s): TSH, T4TOTAL, FREET4, T3FREE, THYROIDAB in the last 72 hours. Anemia Panel: No results for input(s): VITAMINB12, FOLATE, FERRITIN,  TIBC, IRON, RETICCTPCT in the last 72 hours. Urine analysis:    Component Value Date/Time   COLORURINE AMBER (A) 12/08/2016 1131   APPEARANCEUR TURBID (A) 12/08/2016 1131   LABSPEC 1.015 12/08/2016 1131   PHURINE 5.0 12/08/2016 1131   GLUCOSEU 150 (A) 12/08/2016 1131   HGBUR MODERATE (A) 12/08/2016 1131   BILIRUBINUR NEGATIVE 12/08/2016 1131   KETONESUR 5 (A) 12/08/2016 1131   PROTEINUR 100 (A) 12/08/2016 1131   UROBILINOGEN 0.2 01/11/2015 1850   NITRITE NEGATIVE 12/08/2016 1131   LEUKOCYTESUR LARGE (A) 12/08/2016 1131   Sepsis Labs: @LABRCNTIP (procalcitonin:4,lacticidven:4)  ) Recent Results (from the past 240 hour(s))  Body fluid culture     Status: Abnormal   Collection Time: 12/08/16  6:24 PM  Result Value Ref Range Status   Specimen Description BILE  Final   Special Requests NONE  Final   Gram Stain   Final    ABUNDANT WBC PRESENT,BOTH PMN AND MONONUCLEAR MODERATE GRAM NEGATIVE RODS    Culture (A)  Final    ESCHERICHIA COLI Confirmed Extended Spectrum Beta-Lactamase Producer (ESBL) Performed at [facility], [place] [address] [place], [place] [address]    Report Status 12/12/2016 FINAL  Final   Organism ID, Bacteria ESCHERICHIA COLI  Final      Susceptibility   Escherichia coli - MIC*    AMPICILLIN >=32 RESISTANT Resistant     CEFAZOLIN >=64 RESISTANT Resistant     CEFEPIME RESISTANT Resistant     CEFTAZIDIME RESISTANT Resistant     CEFTRIAXONE >=64 RESISTANT Resistant     CIPROFLOXACIN >=4 RESISTANT Resistant     GENTAMICIN >=16 RESISTANT Resistant     IMIPENEM <=0.25 SENSITIVE Sensitive     TRIMETH/SULFA <=20 SENSITIVE Sensitive     AMPICILLIN/SULBACTAM 16 INTERMEDIATE Intermediate     PIP/TAZO <=4 SENSITIVE Sensitive     Extended ESBL POSITIVE Resistant     * ESCHERICHIA COLI  MRSA PCR Screening     Status: Abnormal   Collection Time: 12/08/16  6:51 PM  Result Value Ref Range Status   MRSA by PCR POSITIVE (A) NEGATIVE Final    Comment:        The  GeneXpert MRSA Assay (FDA approved for NASAL specimens only), is one component of a comprehensive MRSA colonization surveillance program. It is not intended to diagnose MRSA infection nor to guide or monitor treatment for MRSA infections. CRITICAL RESULT CALLED TO, READ BACK BY AND VERIFIED WITH: B CHERRY RN 0045 12/09/16 A NAVARRO   C difficile quick scan w PCR reflex     Status: Abnormal  Collection Time: 12/09/16  7:47 PM  Result Value Ref Range Status   C Diff antigen POSITIVE (A) NEGATIVE Final   C Diff toxin POSITIVE (A) NEGATIVE Final    Comment: CRITICAL RESULT CALLED TO, READ BACK BY AND VERIFIED WITH: B.CHERRY AT 2224 12/09/16 BY N.THOMPSON    C Diff interpretation Toxin producing C. difficile detected.  Final    Comment: CRITICAL RESULT CALLED TO, READ BACK BY AND VERIFIED WITH: B.CHERRY AT 2224 12/09/16 BY N.THOMPSON       Radiology Studies: No results found.   Scheduled Meds: . carvedilol  6.25 mg Oral BID WC  . Chlorhexidine Gluconate Cloth  6 each Topical Q0600  . enoxaparin (LOVENOX) injection  1 mg/kg Subcutaneous Q12H  . insulin aspart  0-5 Units Subcutaneous QHS  . insulin aspart  0-9 Units Subcutaneous TID WC  . insulin detemir  11 Units Subcutaneous BID  . mupirocin ointment  1 application Nasal BID  . pantoprazole  40 mg Oral BID  . saccharomyces boulardii  250 mg Oral BID  . sacubitril-valsartan  1 tablet Oral BID  . spironolactone  25 mg Oral Daily  . vancomycin  125 mg Oral QID   Continuous Infusions: . meropenem (MERREM) IV       LOS: 4 days   Time Spent in minutes   30 minutes  Miosha Behe D.O. on 12/12/2016 at 12:30 PM  Between 7am to 7pm - Pager - [phone]  After 7pm go to [email or web address] - password [email or web address]  And look for the night coverage person covering for me after hours  [facility] Office  [phone]

## 2016-12-12 NOTE — Progress Notes (Signed)
Pharmacy Antibiotic Note  [name] [name] is a 71 y.o. male admitted on 12/08/2016. Pharmacy has been consulted for Zosyn dosing for possible perirectal abscess on 4/16. Patient with bile drained and culture growing Ecoli ESBL to change Zosyn to meropenem. Patient also Day 3/14 of PO vanc for Cdiff colitis  Plan: 1) Discontinue Zosyn 2) for CrCl > 50, start meropenem 1g IV q8 3) PO Vanc for Cdiff colitis per Md  Height: 5\' 9"  (175.3 cm) Weight: 210 lb (95.3 kg) IBW/kg (Calculated) : 70.7  Temp (24hrs), Avg:98.1 F (36.7 C), Min:97.7 F (36.5 C), Max:98.4 F (36.9 C)   Recent Labs Lab 12/08/16 1212 12/08/16 1221 12/09/16 0351 12/10/16 0348 12/11/16 0414 12/12/16 0403  WBC 8.8  --  6.4 8.0 6.2 5.2  CREATININE 1.40*  --  1.36* 1.25* 1.33* 1.27*  LATICACIDVEN  --  1.28  --   --   --   --     Estimated Creatinine Clearance: 59 mL/min (A) (by C-G formula based on SCr of 1.27 mg/dL (H)).    No Known Allergies  Antimicrobials this admission: 4/16 >> Zosyn >> 4/20 4/20 Meropenem >> 4/18 PO Vanc >> 5/2  Dose adjustments this admission: --  Microbiology results: 4/16 MRSA PCR: positive 4/16 bile: GNR - Ecoli ESBL 4/17 Cdiff: positive   Thank you for allowing pharmacy to be a part of this patient's care.  Adrian Saran, PharmD, BCPS Pager [phone] 12/12/2016 9:58 AM

## 2016-12-12 NOTE — Progress Notes (Signed)
HHPT/RN orders received from MD and faxed to [facility]. Marney Doctor RN,BSN,NCN [phone]

## 2016-12-12 NOTE — Progress Notes (Signed)
Remote ICD transmission.   

## 2016-12-12 NOTE — Progress Notes (Addendum)
[facility] Gastroenterology Progress Note  JONA ZAPPONE 71 y.o. 12-08-1945   Subjective: Feels ok. Denies abdominal pain. Wife at bedside.  Objective: Vital signs in last 24 hours: Vitals:   12/11/16 2045 12/12/16 0434  BP: (!) 105/56 119/64  Pulse: 90 76  Resp: 20 18  Temp: 98.4 F (36.9 C) 98 F (36.7 C)    Physical Exam: Gen: lethargic, elderly, no acute distress, well-nourished CV: RRR Chest: CTA B Abd: minimal epigastric tenderness with guarding, otherwise nontender, nondistended, +BS, soft  Lab Results:  Recent Labs  12/12/16 0403 12/12/16 1306  NA 138 137  K 3.6 4.1  CL 107 107  CO2 21* 23  GLUCOSE 154* 217*  BUN 14 13  CREATININE 1.27* 1.10  CALCIUM 8.2* 8.2*   No results for input(s): AST, ALT, ALKPHOS, BILITOT, PROT, ALBUMIN in the last 72 hours.  Recent Labs  12/11/16 0414 12/12/16 0403  WBC 6.2 5.2  HGB 9.6* 9.3*  HCT 28.8* 28.4*  MCV 71.6* 72.3*  PLT 274 264    Recent Labs  12/10/16 1351 12/11/16 0414  LABPROT 14.2 14.6  INR 1.09 1.14      Assessment/Plan: Pancreatic mass with mets to liver - EUS scheduled for Monday 12/15/16 at 41 AM to be done by Dr. Paulita Fujita. Hold Lovenox injection the morning of 12/15/16 for the planned EUS and biopsy. Ok to receive 10 pm dose on 12/14/16. Advance to low fat diet this weekend but clear liquid evening before EUS and NPO p MN that night.  C. Diff - Improving on PO Vanco and Florastor  Cholecystitis - cholecystostomy tube in place (exchanged on 12/08/16); On Meropenem for E. Coli in culture of bile  Dr. Cristina Gong available to see this weekend if needed. Awaiting EUS on Monday.    [place] C. 12/12/2016, 2:08 PM   Pager [phone]  AFTER 5 pm or on weekends call [phone] ID: [name], male   DOB: 1946-04-03, 71 y.o.   MRN: [id number]

## 2016-12-13 LAB — CBC
HEMATOCRIT: 28.6 % — AB (ref 39.0–52.0)
HEMOGLOBIN: 9.2 g/dL — AB (ref 13.0–17.0)
MCH: 23.3 pg — ABNORMAL LOW (ref 26.0–34.0)
MCHC: 32.2 g/dL (ref 30.0–36.0)
MCV: 72.4 fL — ABNORMAL LOW (ref 78.0–100.0)
Platelets: 291 10*3/uL (ref 150–400)
RBC: 3.95 MIL/uL — AB (ref 4.22–5.81)
RDW: 18.5 % — ABNORMAL HIGH (ref 11.5–15.5)
WBC: 4.6 10*3/uL (ref 4.0–10.5)

## 2016-12-13 LAB — GLUCOSE, CAPILLARY
GLUCOSE-CAPILLARY: 213 mg/dL — AB (ref 65–99)
Glucose-Capillary: 128 mg/dL — ABNORMAL HIGH (ref 65–99)

## 2016-12-13 NOTE — Progress Notes (Signed)
PROGRESS NOTE    [name] [name]  YQM:[id number] DOB: 25-Mar-1946 DOA: 12/08/2016 PCP: [name], MD   Chief Complaint  Patient presents with  . Weakness  . Drainage from Incision    GALLBLADDER STENT    Brief Narrative:  HPI on 12/08/2016 by Dr. [name]  is a 71 y.o. male, with medical history significant of NICM status post ICD, CHFwith EF to 20-25% in 2014, DM-2, CKD, PAF on coumadin, bilateral BKA , with recent hospitalization secondary to cholecystitis, where patient discharge with cholecystostomy tube, patient presents with progressive weakness, failure to thrive, no appetite weight loss, intermittent fever as well secondary to malfunction and cholecystomy drain as bag has been disconnected, patient is a febrile NAD, with no leukocytosis, but CT abdomen and pelvis showing evidence of pancreatic mass or pancreatic ductal obstruction,, with possible liver metastases, and abnormal finding of perirectal area suspicious for abscess, patient denies any chest pain, shortness of breath, dysuria, polyuria, cough or productive sputum, wife reports he is with progressive weakness, sickly looking on the provided entire weekend, with no appetite and oral intake, I was called to admit. 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 EUS was recommended, however, will not be done until sometime next week.  -INR improved with vitamin K, currently 1.14 -General surgery consulted and appreciated, however patient not a surgical candidate for resection of Whipple  -Interventional radiology consulted and appreciated, drain was exchanged  -Oncology consulted and appreciated, pending EUS/biospy. If biopsy shows metastatic panc ca, palliative care consult would be appropriate -[id number]   Failure to  thrive, progressive weakness -Likely secondary to the above -PT consulted recommended HH  Cholecystitis -Chronic, status post cholecystostomy, tube was broken and leaking -IR consulted, s/p exchange on 12/08/16   -Body fluid culture: GNR- ESBL Ecoli -Initially placed on Zosyn, Transitioned to merrem  Atrial fibrillation -Currently rate controlled -Coumadin held for possible biopsy  -continue Coreg -CHADSVASC 4 (based on age, CHF, HTN, DM) -Given that INR is down to 1.14, started on full dose lovenox  C Difficile -C diff positive antigen and toxin -Continue PO vancomycin and florastor -Diarrhea improving   Bilateral renal knee amputation -PT consulted and recommended [facility] -Case management consulted, orders placed for [facility]  Abormal finding in CT imaging of the rectal area -Gen. surgery input greatly appreciated, no evidence of perirectal abscess.  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 Continue IV merrem for treatment of ESBL Ecoli.  Consultants Gastroenterology Interventional radiology Oncology  General Surgery  Procedures  Exchange of perc chole drain (secondary to leakage) by  IR  Antibiotics   Anti-infectives    Start     Dose/Rate Route  Frequency Ordered Stop   12/12/16 1200  meropenem (MERREM) 1 g in sodium chloride 0.9 % 100 mL IVPB     1 g 200 mL/hr over 30 Minutes Intravenous Every 8 hours 12/12/16 1000     12/10/16 1000  vancomycin (VANCOCIN) 50 mg/mL oral solution 125 mg     125 mg Oral 4 times daily 12/10/16 0331 12/24/16 0959   12/09/16 0000  piperacillin-tazobactam (ZOSYN) IVPB 3.375 g  Status:  Discontinued     3.375 g 12.5 mL/hr over 240 Minutes Intravenous Every 8 hours 12/08/16 1808 12/12/16 0951   12/08/16 1730  piperacillin-tazobactam (ZOSYN) IVPB 3.375 g     3.375 g 100 mL/hr over 30 Minutes Intravenous STAT 12/08/16 1722 12/08/16 1810      Subjective:   [name] [name] seen and examined today. Patient currently feels diarrhea is improving, 1-2 episodes yesterday. Denies chest pain, shortness of breath, abdominal pain, N/V/C. Does complain of pain where the tube is placed.  Objective:   Vitals:   12/12/16 1445 12/12/16 1544 12/12/16 2112 12/13/16 0645  BP: 117/71  (!) 106/53 128/68  Pulse: (!) 48 78 87 86  Resp: 16  14 16   Temp: 98.1 F (36.7 C)  98.2 F (36.8 C) 98 F (36.7 C)  TempSrc: Oral  Oral Oral  SpO2: 100%  100% 100%  Weight:      Height:        Intake/Output Summary (Last 24 hours) at 12/13/16 1229 Last data filed at 12/13/16 0645  Gross per 24 hour  Intake             1920 ml  Output             1125 ml  Net              795 ml   Filed Weights   12/08/16 1130  Weight: 95.3 kg (210 lb)    Exam (unchanged from previous days)  General: Well developed, well nourished, NAD, appears stated age  74: NCAT, mucous membranes moist.   Extremities: B/L BKA  Neuro: AAOx3, nonfocal  Psych: Normal affect and demeanor, pleasant   Data Reviewed: I have personally reviewed following labs and imaging studies  CBC:  Recent Labs Lab 12/08/16 1212 12/09/16 0351 12/10/16 0348 12/11/16 0414 12/12/16 0403  12/13/16 0435  WBC 8.8 6.4 8.0 6.2 5.2 4.6  NEUTROABS 7.3  --   --   --   --   --   HGB 11.2* 9.7* 10.0* 9.6* 9.3* 9.2*  HCT 33.9* 29.5* 30.6* 28.8* 28.4* 28.6*  MCV 72.4* 72.1* 71.8* 71.6* 72.3* 72.4*  PLT 250 248 280 274 264 465   Basic Metabolic Panel:  Recent Labs Lab 12/09/16 0351 12/10/16 0348 12/11/16 0414 12/12/16 0403 12/12/16 1306  NA 140 139 138 138 137  K 3.6 3.5 3.5 3.6 4.1  CL 108 108 107 107 107  CO2 24 22 23  21* 23  GLUCOSE 320* 191* 211* 154* 217*  BUN 25* 17 16 14 13   CREATININE 1.36* 1.25* 1.33* 1.27* 1.10  CALCIUM 8.3* 8.2* 8.2* 8.2* 8.2*   GFR: Estimated Creatinine Clearance: 68.1 mL/min (by C-G formula based on SCr of 1.1 mg/dL). Liver Function Tests:  Recent Labs Lab 12/08/16 1212 12/09/16 0351  AST 14* 10*  ALT 19 14*  ALKPHOS 191* 136*  BILITOT 1.0 0.8  PROT 7.6 6.5  ALBUMIN 2.9* 2.5*    Recent Labs Lab 12/08/16 1212  LIPASE 53*   No results for input(s):  AMMONIA in the last 168 hours. Coagulation Profile:  Recent Labs Lab 12/08/16 1212 12/09/16 1020 12/10/16 1351 12/11/16 0414  INR 1.84 2.34 1.09 1.14   Cardiac Enzymes: No results for input(s): CKTOTAL, CKMB, CKMBINDEX, TROPONINI in the last 168 hours. BNP (last 3 results) No results for input(s): PROBNP in the last 8760 hours. HbA1C: No results for input(s): HGBA1C in the last 72 hours. CBG:  Recent Labs Lab 12/12/16 1221 12/12/16 1629 12/12/16 2115 12/13/16 0744 12/13/16 1204  GLUCAP 204* 241* 275* 128* 213*   Lipid Profile: No results for input(s): CHOL, HDL, LDLCALC, TRIG, CHOLHDL, LDLDIRECT in the last 72 hours. Thyroid Function Tests: No results for input(s): TSH, T4TOTAL, FREET4, T3FREE, THYROIDAB in the last 72 hours. Anemia Panel: No results for input(s): VITAMINB12, FOLATE, FERRITIN, TIBC, IRON, RETICCTPCT in the last 72 hours. Urine analysis:    Component Value Date/Time   COLORURINE AMBER (A) 12/08/2016 1131   APPEARANCEUR TURBID (A) 12/08/2016  1131   LABSPEC 1.015 12/08/2016 1131   PHURINE 5.0 12/08/2016 1131   GLUCOSEU 150 (A) 12/08/2016 1131   HGBUR MODERATE (A) 12/08/2016 1131   BILIRUBINUR NEGATIVE 12/08/2016 1131   KETONESUR 5 (A) 12/08/2016 1131   PROTEINUR 100 (A) 12/08/2016 1131   UROBILINOGEN 0.2 01/11/2015 1850   NITRITE NEGATIVE 12/08/2016 1131   LEUKOCYTESUR LARGE (A) 12/08/2016 1131   Sepsis Labs: @LABRCNTIP (procalcitonin:4,lacticidven:4)  ) Recent Results (from the past 240 hour(s))  Body fluid culture     Status: Abnormal   Collection Time: 12/08/16  6:24 PM  Result Value Ref Range Status   Specimen Description BILE  Final   Special Requests NONE  Final   Gram Stain   Final    ABUNDANT WBC PRESENT,BOTH PMN AND MONONUCLEAR MODERATE GRAM NEGATIVE RODS    Culture (A)  Final    ESCHERICHIA COLI Confirmed Extended Spectrum Beta-Lactamase Producer (ESBL) Performed at [facility], [place] [address] [place], [place] [address]    Report Status 12/12/2016 FINAL  Final   Organism ID, Bacteria ESCHERICHIA COLI  Final      Susceptibility   Escherichia coli - MIC*    AMPICILLIN >=32 RESISTANT Resistant     CEFAZOLIN >=64 RESISTANT Resistant     CEFEPIME RESISTANT Resistant     CEFTAZIDIME RESISTANT Resistant     CEFTRIAXONE >=64 RESISTANT Resistant     CIPROFLOXACIN >=4 RESISTANT Resistant     GENTAMICIN >=16 RESISTANT Resistant     IMIPENEM <=0.25 SENSITIVE Sensitive     TRIMETH/SULFA <=20 SENSITIVE Sensitive     AMPICILLIN/SULBACTAM 16 INTERMEDIATE Intermediate     PIP/TAZO <=4 SENSITIVE Sensitive     Extended ESBL POSITIVE Resistant     * ESCHERICHIA COLI  MRSA PCR Screening     Status: Abnormal   Collection Time: 12/08/16  6:51 PM  Result Value Ref Range Status   MRSA by PCR POSITIVE (A) NEGATIVE Final    Comment:        The GeneXpert MRSA Assay (FDA approved for NASAL specimens only), is one component of a comprehensive MRSA colonization surveillance program. It is not intended to  diagnose MRSA infection nor to guide or monitor treatment for MRSA infections. CRITICAL RESULT CALLED TO, READ BACK BY AND VERIFIED WITH: B CHERRY RN 0045 12/09/16 A NAVARRO   C difficile quick scan w PCR reflex     Status: Abnormal   Collection Time: 12/09/16  7:47 PM  Result Value Ref Range Status   C Diff antigen POSITIVE (A) NEGATIVE  Final   C Diff toxin POSITIVE (A) NEGATIVE Final    Comment: CRITICAL RESULT CALLED TO, READ BACK BY AND VERIFIED WITH: B.CHERRY AT 2224 12/09/16 BY N.THOMPSON    C Diff interpretation Toxin producing C. difficile detected.  Final    Comment: CRITICAL RESULT CALLED TO, READ BACK BY AND VERIFIED WITH: B.CHERRY AT 2224 12/09/16 BY N.THOMPSON       Radiology Studies: No results found.   Scheduled Meds: . carvedilol  6.25 mg Oral BID WC  . Chlorhexidine Gluconate Cloth  6 each Topical Q0600  . enoxaparin (LOVENOX) injection  1 mg/kg Subcutaneous Q12H  . insulin aspart  0-5 Units Subcutaneous QHS  . insulin aspart  0-9 Units Subcutaneous TID WC  . insulin detemir  11 Units Subcutaneous BID  . pantoprazole  40 mg Oral BID  . saccharomyces boulardii  250 mg Oral BID  . sacubitril-valsartan  1 tablet Oral BID  . spironolactone  25 mg Oral Daily  . vancomycin  125 mg Oral QID   Continuous Infusions: . meropenem (MERREM) IV 1 g (12/13/16 1225)     LOS: 5 days   Time Spent in minutes   30 minutes  Symantha Steeber D.O. on 12/13/2016 at 12:29 PM  Between 7am to 7pm - Pager - [phone]  After 7pm go to [email or web address] - password [email or web address]  And look for the night coverage person covering for me after hours  [facility] Office  [phone]

## 2016-12-14 LAB — GLUCOSE, CAPILLARY
GLUCOSE-CAPILLARY: 238 mg/dL — AB (ref 65–99)
Glucose-Capillary: 172 mg/dL — ABNORMAL HIGH (ref 65–99)
Glucose-Capillary: 278 mg/dL — ABNORMAL HIGH (ref 65–99)
Glucose-Capillary: 168 mg/dL — ABNORMAL HIGH (ref 65–99)
Glucose-Capillary: 277 mg/dL — ABNORMAL HIGH (ref 65–99)
Glucose-Capillary: 178 mg/dL — ABNORMAL HIGH (ref 65–99)
Glucose-Capillary: 276 mg/dL — ABNORMAL HIGH (ref 65–99)

## 2016-12-14 LAB — CBC
HCT: 27.7 % — ABNORMAL LOW (ref 39.0–52.0)
Hemoglobin: 9 g/dL — ABNORMAL LOW (ref 13.0–17.0)
MCH: 23.4 pg — ABNORMAL LOW (ref 26.0–34.0)
MCHC: 32.5 g/dL (ref 30.0–36.0)
MCV: 72.1 fL — ABNORMAL LOW (ref 78.0–100.0)
Platelets: 295 K/uL (ref 150–400)
RBC: 3.84 MIL/uL — ABNORMAL LOW (ref 4.22–5.81)
RDW: 18.5 % — ABNORMAL HIGH (ref 11.5–15.5)
WBC: 4.6 K/uL (ref 4.0–10.5)

## 2016-12-14 LAB — BASIC METABOLIC PANEL
Anion gap: 7 (ref 5–15)
BUN: 11 mg/dL (ref 6–20)
CALCIUM: 8.3 mg/dL — AB (ref 8.9–10.3)
CO2: 23 mmol/L (ref 22–32)
Chloride: 108 mmol/L (ref 101–111)
Creatinine, Ser: 1.03 mg/dL (ref 0.61–1.24)
GFR calc Af Amer: >60 mL/min (ref >60)
GFR calc non Af Amer: >60 mL/min (ref >60)
GLUCOSE: 187 mg/dL — AB (ref 65–99)
Potassium: 4 mmol/L (ref 3.5–5.1)
Sodium: 138 mmol/L (ref 135–145)

## 2016-12-14 NOTE — Care Management Note (Signed)
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 Wife states wheelchair is worn with bolts coming out. States it has been approximately 5 years since he received. Contacted [employer] DME rep for new light weight manual wheelchair with delivery to room on 12/15/2016. Faxed to [facility] progress note for wheelchair.  PCP Josetta Huddle MD  Expected Discharge Date:                Expected Discharge Plan:  [place]  In-House Referral:  NA  Discharge planning Services  CM Consult  Post Acute Care Choice:  Home Health Choice offered to:  Spouse, Patient  DME Arranged:  [employer] wheelchair with seat cushion DME Agency:  [place]:  PT, RN [facility] Agency:  [facility] [place] [facility]  Status of Service:  In process, will continue to follow  If discussed at Long Length of Stay Meetings, dates discussed:    Additional Comments:  Erenest Rasher, RN 12/14/2016, 4:41 PM

## 2016-12-14 NOTE — Progress Notes (Signed)
PROGRESS NOTE    [name] [name]  NWG:[id number] DOB: [birth year] DOA: 12/08/2016 PCP: [name] Screws, MD   Chief Complaint  Patient presents with  . Weakness  . Drainage from Incision    GALLBLADDER STENT    Brief Narrative:  HPI on 12/08/2016 by Dr. Eston Mould [name]  is a 71 y.o. male, with medical history significant of NICM status post ICD, CHFwith EF to 20-25% in 2014, DM-2, CKD, PAF on coumadin, bilateral BKA , with recent hospitalization secondary to cholecystitis, where patient discharge with cholecystostomy tube, patient presents with progressive weakness, failure to thrive, no appetite weight loss, intermittent fever as well secondary to malfunction and cholecystomy drain as bag has been disconnected, patient is a febrile NAD, with no leukocytosis, but CT abdomen and pelvis showing evidence of pancreatic mass or pancreatic ductal obstruction,, with possible liver metastases, and abnormal finding of perirectal area suspicious for abscess, patient denies any chest pain, shortness of breath, dysuria, polyuria, cough or productive sputum, wife reports he is with progressive weakness, sickly looking on the provided entire weekend, with no appetite and oral intake, I was called to admit. 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 EUS was recommended, however, will not be done until sometime next week.  -INR improved with vitamin K, currently 1.14 -General surgery consulted and appreciated, however patient not a surgical candidate for resection of Whipple  -Interventional radiology consulted and appreciated, drain was exchanged  -Oncology consulted and appreciated, pending EUS/biospy. If biopsy shows metastatic panc ca, palliative care consult would be appropriate -[id number]   Failure to  thrive, progressive weakness -Likely secondary to the above -PT consulted recommended HH  Cholecystitis -Chronic, status post cholecystostomy, tube was broken and leaking -IR consulted, s/p exchange on 12/08/16   -Body fluid culture: GNR- ESBL Ecoli -Initially placed on Zosyn, Transitioned to merrem  Atrial fibrillation -Currently rate controlled -Coumadin held for possible biopsy  -continue Coreg -CHADSVASC 4 (based on age, CHF, HTN, DM) -Given that INR is down to 1.14, continue full dose lovenox  C Difficile -C diff positive antigen and toxin -Continue PO vancomycin and florastor -Diarrhea improving   Bilateral renal knee amputation -PT consulted and recommended [facility] -Case management consulted, orders placed for [facility]  Abormal finding in CT imaging of the rectal area -Gen. surgery input greatly appreciated, no evidence of perirectal abscess.  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 Continue IV merrem for treatment of ESBL Ecoli.  Consultants Gastroenterology Interventional radiology Oncology  General Surgery  Procedures  Exchange of perc chole drain (secondary to leakage) by IR  Antibiotics     Anti-infectives    Start     Dose/Rate Route  Frequency Ordered Stop   12/12/16 1200  meropenem (MERREM) 1 g in sodium chloride 0.9 % 100 mL IVPB     1 g 200 mL/hr over 30 Minutes Intravenous Every 8 hours 12/12/16 1000     12/10/16 1000  vancomycin (VANCOCIN) 50 mg/mL oral solution 125 mg     125 mg Oral 4 times daily 12/10/16 0331 12/24/16 0959   12/09/16 0000  piperacillin-tazobactam (ZOSYN) IVPB 3.375 g  Status:  Discontinued     3.375 g 12.5 mL/hr over 240 Minutes Intravenous Every 8 hours 12/08/16 1808 12/12/16 0951   12/08/16 1730  piperacillin-tazobactam (ZOSYN) IVPB 3.375 g     3.375 g 100 mL/hr over 30 Minutes Intravenous STAT 12/08/16 1722 12/08/16 1810      Subjective:   [name] [name] seen and examined today. Patient feels he was a little dizzy this morning. Denies further diarrhea today. Denies chest pain, shortness of breath, abdominal pain, nausea or vomiting, headache.  Objective:   Vitals:   12/13/16 0645 12/13/16 1415 12/13/16 2037 12/14/16 0453  BP: 128/68 (!) 122/58 (!) 151/57 119/64  Pulse: 86 100 (!) 50 65  Resp: 16 18 16 18   Temp: 98 F (36.7 C) 98.2 F (36.8 C) 97.1 F (36.2 C) 98 F (36.7 C)  TempSrc: Oral Oral Oral Oral  SpO2: 100% 100% 100% 98%  Weight:      Height:        Intake/Output Summary (Last 24 hours) at 12/14/16 1019 Last data filed at 12/14/16 0913  Gross per 24 hour  Intake              930 ml  Output             1525 ml  Net             -595 ml   Filed Weights   12/08/16 1130  Weight: 95.3 kg (210 lb)    Exam (unchanged from previous days)  General: Well developed, well nourished, NAD, appears stated age  20: NCAT, mucous membranes moist.   Cardiovascular: S1/S2 auscultated, RRR  Respiratory: Clear to auscultation   Abdomen: Soft, nontender, nondistended, +BS, + cholecystectomy tube with drainage  Extremities: B/L BKA  Neuro: AAOx3, nonfocal  Psych: Normal affect and demeanor, pleasant   Data  Reviewed: I have personally reviewed following labs and imaging studies  CBC:  Recent Labs Lab 12/08/16 1212  12/10/16 0348 12/11/16 0414 12/12/16 0403 12/13/16 0435 12/14/16 0415  WBC 8.8  < > 8.0 6.2 5.2 4.6 4.6  NEUTROABS 7.3  --   --   --   --   --   --   HGB 11.2*  < > 10.0* 9.6* 9.3* 9.2* 9.0*  HCT 33.9*  < > 30.6* 28.8* 28.4* 28.6* 27.7*  MCV 72.4*  < > 71.8* 71.6* 72.3* 72.4* 72.1*  PLT 250  < > 280 274 264 291 295  < > = values in this interval not displayed. Basic Metabolic Panel:  Recent Labs Lab 12/10/16 0348 12/11/16 0414 12/12/16 0403 12/12/16 1306 12/14/16 0415  NA 139 138 138 137 138  K 3.5 3.5 3.6 4.1 4.0  CL 108 107 107 107 108  CO2 22 23 21* 23 23  GLUCOSE 191* 211* 154* 217* 187*  BUN 17 16 14 13 11   CREATININE 1.25* 1.33* 1.27* 1.10 1.03  CALCIUM 8.2* 8.2* 8.2* 8.2* 8.3*   GFR: Estimated Creatinine Clearance: 72.7 mL/min (by C-G formula based on SCr of 1.03 mg/dL). Liver  Function Tests:  Recent Labs Lab 12/08/16 1212 12/09/16 0351  AST 14* 10*  ALT 19 14*  ALKPHOS 191* 136*  BILITOT 1.0 0.8  PROT 7.6 6.5  ALBUMIN 2.9* 2.5*    Recent Labs Lab 12/08/16 1212  LIPASE 53*   No results for input(s): AMMONIA in the last 168 hours. Coagulation Profile:  Recent Labs Lab 12/08/16 1212 12/09/16 1020 12/10/16 1351 12/11/16 0414  INR 1.84 2.34 1.09 1.14   Cardiac Enzymes: No results for input(s): CKTOTAL, CKMB, CKMBINDEX, TROPONINI in the last 168 hours. BNP (last 3 results) No results for input(s): PROBNP in the last 8760 hours. HbA1C: No results for input(s): HGBA1C in the last 72 hours. CBG:  Recent Labs Lab 12/12/16 1629 12/12/16 2115 12/13/16 0744 12/13/16 1204 12/14/16 0737  GLUCAP 241* 275* 128* 213* 168*   Lipid Profile: No results for input(s): CHOL, HDL, LDLCALC, TRIG, CHOLHDL, LDLDIRECT in the last 72 hours. Thyroid Function Tests: No results for input(s): TSH, T4TOTAL, FREET4, T3FREE, THYROIDAB in the last 72  hours. Anemia Panel: No results for input(s): VITAMINB12, FOLATE, FERRITIN, TIBC, IRON, RETICCTPCT in the last 72 hours. Urine analysis:    Component Value Date/Time   COLORURINE AMBER (A) 12/08/2016 1131   APPEARANCEUR TURBID (A) 12/08/2016 1131   LABSPEC 1.015 12/08/2016 1131   PHURINE 5.0 12/08/2016 1131   GLUCOSEU 150 (A) 12/08/2016 1131   HGBUR MODERATE (A) 12/08/2016 1131   BILIRUBINUR NEGATIVE 12/08/2016 1131   KETONESUR 5 (A) 12/08/2016 1131   PROTEINUR 100 (A) 12/08/2016 1131   UROBILINOGEN 0.2 01/11/2015 1850   NITRITE NEGATIVE 12/08/2016 1131   LEUKOCYTESUR LARGE (A) 12/08/2016 1131   Sepsis Labs: @LABRCNTIP (procalcitonin:4,lacticidven:4)  ) Recent Results (from the past 240 hour(s))  Body fluid culture     Status: Abnormal   Collection Time: 12/08/16  6:24 PM  Result Value Ref Range Status   Specimen Description BILE  Final   Special Requests NONE  Final   Gram Stain   Final    ABUNDANT WBC PRESENT,BOTH PMN AND MONONUCLEAR MODERATE GRAM NEGATIVE RODS    Culture (A)  Final    ESCHERICHIA COLI Confirmed Extended Spectrum Beta-Lactamase Producer (ESBL) Performed at [facility], [place] [address] [place], [place] [address]    Report Status 12/12/2016 FINAL  Final   Organism ID, Bacteria ESCHERICHIA COLI  Final      Susceptibility   Escherichia coli - MIC*    AMPICILLIN >=32 RESISTANT Resistant     CEFAZOLIN >=64 RESISTANT Resistant     CEFEPIME RESISTANT Resistant     CEFTAZIDIME RESISTANT Resistant     CEFTRIAXONE >=64 RESISTANT Resistant     CIPROFLOXACIN >=4 RESISTANT Resistant     GENTAMICIN >=16 RESISTANT Resistant     IMIPENEM <=0.25 SENSITIVE Sensitive     TRIMETH/SULFA <=20 SENSITIVE Sensitive     AMPICILLIN/SULBACTAM 16 INTERMEDIATE Intermediate     PIP/TAZO <=4 SENSITIVE Sensitive     Extended ESBL POSITIVE Resistant     * ESCHERICHIA COLI  MRSA PCR Screening     Status: Abnormal   Collection Time: 12/08/16  6:51 PM  Result Value Ref  Range Status   MRSA by PCR POSITIVE (A) NEGATIVE Final    Comment:        The GeneXpert MRSA Assay (FDA approved for NASAL specimens only), is one component of a comprehensive MRSA colonization surveillance program. It is not intended to diagnose MRSA infection nor to guide or monitor treatment for MRSA infections. CRITICAL RESULT CALLED  TO, READ BACK BY AND VERIFIED WITH: B CHERRY RN 0045 12/09/16 A NAVARRO   C difficile quick scan w PCR reflex     Status: Abnormal   Collection Time: 12/09/16  7:47 PM  Result Value Ref Range Status   C Diff antigen POSITIVE (A) NEGATIVE Final   C Diff toxin POSITIVE (A) NEGATIVE Final    Comment: CRITICAL RESULT CALLED TO, READ BACK BY AND VERIFIED WITH: B.CHERRY AT 2224 12/09/16 BY N.THOMPSON    C Diff interpretation Toxin producing C. difficile detected.  Final    Comment: CRITICAL RESULT CALLED TO, READ BACK BY AND VERIFIED WITH: B.CHERRY AT 2224 12/09/16 BY N.THOMPSON       Radiology Studies: No results found.   Scheduled Meds: . carvedilol  6.25 mg Oral BID WC  . enoxaparin (LOVENOX) injection  1 mg/kg Subcutaneous Q12H  . insulin aspart  0-5 Units Subcutaneous QHS  . insulin aspart  0-9 Units Subcutaneous TID WC  . insulin detemir  11 Units Subcutaneous BID  . pantoprazole  40 mg Oral BID  . saccharomyces boulardii  250 mg Oral BID  . sacubitril-valsartan  1 tablet Oral BID  . spironolactone  25 mg Oral Daily  . vancomycin  125 mg Oral QID   Continuous Infusions: . meropenem (MERREM) IV Stopped (12/14/16 0435)     LOS: 6 days   Time Spent in minutes   30 minutes  Teshaun Olarte D.O. on 12/14/2016 at 10:19 AM  Between 7am to 7pm - Pager - [phone]  After 7pm go to [email or web address] - password [email or web address]  And look for the night coverage person covering for me after hours  [facility] Office  [phone]

## 2016-12-15 ENCOUNTER — Encounter (HOSPITAL_COMMUNITY): Payer: Self-pay

## 2016-12-15 ENCOUNTER — Inpatient Hospital Stay (HOSPITAL_COMMUNITY): Payer: Medicare Other | Admitting: Certified Registered Nurse Anesthetist

## 2016-12-15 ENCOUNTER — Encounter (HOSPITAL_COMMUNITY)
Admission: EM | Disposition: A | Discharge status: Home Health | Payer: Self-pay | Source: Home / Self Care | Attending: Internal Medicine

## 2016-12-15 DIAGNOSIS — R6881 Early satiety: Secondary | ICD-10-CM

## 2016-12-15 DIAGNOSIS — Z89519 Acquired absence of unspecified leg below knee: Secondary | ICD-10-CM

## 2016-12-15 DIAGNOSIS — R110 Nausea: Secondary | ICD-10-CM

## 2016-12-15 HISTORY — PX: EUS: SHX5427

## 2016-12-15 LAB — GLUCOSE, CAPILLARY
GLUCOSE-CAPILLARY: 240 mg/dL — AB (ref 65–99)
GLUCOSE-CAPILLARY: 153 mg/dL — AB (ref 65–99)
GLUCOSE-CAPILLARY: 244 mg/dL — AB (ref 65–99)
GLUCOSE-CAPILLARY: 303 mg/dL — AB (ref 65–99)
Glucose-Capillary: 257 mg/dL — ABNORMAL HIGH (ref 65–99)

## 2016-12-15 LAB — CBC
HCT: 28.9 % — ABNORMAL LOW (ref 39.0–52.0)
Hemoglobin: 9.4 g/dL — ABNORMAL LOW (ref 13.0–17.0)
MCH: 23.6 pg — AB (ref 26.0–34.0)
MCHC: 32.5 g/dL (ref 30.0–36.0)
MCV: 72.6 fL — AB (ref 78.0–100.0)
PLATELETS: 295 10*3/uL (ref 150–400)
RBC: 3.98 MIL/uL — ABNORMAL LOW (ref 4.22–5.81)
RDW: 18.6 % — AB (ref 11.5–15.5)
WBC: 3.8 10*3/uL — AB (ref 4.0–10.5)

## 2016-12-15 SURGERY — UPPER ENDOSCOPIC ULTRASOUND (EUS) LINEAR
Anesthesia: Monitor Anesthesia Care | Laterality: Left

## 2016-12-15 MED ORDER — PREMIER PROTEIN SHAKE
11.0000 [oz_av] | Freq: Two times a day (BID) | ORAL | Status: DC
  Administered 2016-12-16 (×2): 11 [oz_av] via ORAL
  Filled 2016-12-15 (×3): qty 325.31

## 2016-12-15 MED ORDER — LIDOCAINE HCL (CARDIAC) 20 MG/ML IV SOLN
INTRAVENOUS | Status: DC | PRN
  Administered 2016-12-15: 100 mg via INTRAVENOUS

## 2016-12-15 MED ORDER — PROPOFOL 10 MG/ML IV BOLUS
INTRAVENOUS | Status: AC
  Filled 2016-12-15: qty 60

## 2016-12-15 MED ORDER — SODIUM CHLORIDE 0.9 % IV SOLN
INTRAVENOUS | Status: DC
  Administered 2016-12-15: 500 mL via INTRAVENOUS

## 2016-12-15 MED ORDER — SODIUM CHLORIDE 0.9 % IV SOLN
INTRAVENOUS | Status: DC | PRN
  Administered 2016-12-15: 12:00:00 via INTRAVENOUS

## 2016-12-15 MED ORDER — LIDOCAINE 2% (20 MG/ML) 5 ML SYRINGE
INTRAMUSCULAR | Status: AC
  Filled 2016-12-15: qty 5

## 2016-12-15 MED ORDER — PHENYLEPHRINE HCL 10 MG/ML IJ SOLN
INTRAMUSCULAR | Status: DC | PRN
  Administered 2016-12-15 (×3): 80 ug via INTRAVENOUS

## 2016-12-15 MED ORDER — PROPOFOL 500 MG/50ML IV EMUL
INTRAVENOUS | Status: DC | PRN
  Administered 2016-12-15: 80 mg via INTRAVENOUS

## 2016-12-15 MED ORDER — PROPOFOL 500 MG/50ML IV EMUL
INTRAVENOUS | Status: DC | PRN
  Administered 2016-12-15: 100 ug/kg/min via INTRAVENOUS

## 2016-12-15 MED ORDER — ENOXAPARIN SODIUM 100 MG/ML ~~LOC~~ SOLN
1.0000 mg/kg | Freq: Two times a day (BID) | SUBCUTANEOUS | Status: DC
  Administered 2016-12-15 – 2016-12-16 (×2): 95 mg via SUBCUTANEOUS
  Filled 2016-12-15 (×2): qty 1

## 2016-12-15 MED ORDER — PHENYLEPHRINE 40 MCG/ML (10ML) SYRINGE FOR IV PUSH (FOR BLOOD PRESSURE SUPPORT)
PREFILLED_SYRINGE | INTRAVENOUS | Status: AC
  Filled 2016-12-15: qty 10

## 2016-12-15 NOTE — Progress Notes (Signed)
PROGRESS NOTE    [name] [name]  RUE:[id number] DOB: 10-06-1945 DOA: 12/08/2016 PCP: [name], MD   Chief Complaint  [name] [name] presents with  . Weakness  . Drainage from Incision    GALLBLADDER STENT    Brief Narrative:  HPI on 12/08/2016 by Dr. Eston Mould Teichert  is a 71 y.o. male, with medical history significant of NICM status post ICD, CHFwith EF to 20-25% in 2014, DM-2, CKD, PAF on coumadin, bilateral BKA , with recent hospitalization secondary to cholecystitis, where [name] [name] discharge with cholecystostomy tube, [name] [name] presents with progressive weakness, failure to thrive, no appetite weight loss, intermittent fever as well secondary to malfunction and cholecystomy drain as bag has been disconnected, [name] [name] is a febrile NAD, with no leukocytosis, but CT abdomen and pelvis showing evidence of pancreatic mass or pancreatic ductal obstruction,, with possible liver metastases, and abnormal finding of perirectal area suspicious for abscess, [name] [name] denies any chest pain, shortness of breath, dysuria, polyuria, cough or productive sputum, wife reports he is with progressive weakness, sickly looking on the provided entire weekend, with no appetite and oral intake, I was called to admit.  Interim history [name] [name] pending EUS/biopsy to evaluate for possible metastatic pancreatic cancer. 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 EUS was recommended, however, will not be done until sometime next week.  -INR improved with vitamin K, currently 1.14 -General surgery consulted and appreciated, however [name] [name] not a surgical candidate for resection of Whipple  -Interventional radiology consulted and appreciated, drain was exchanged  -Oncology consulted and appreciated, pending EUS/biospy. If biopsy  shows metastatic panc ca, palliative care consult would be appropriate -[id number]  -EUS to occur today, 12/15/2016  Failure to thrive, progressive weakness -Likely secondary to the above -PT consulted recommended HH  Cholecystitis -Chronic, status post cholecystostomy, tube was broken and leaking -IR consulted, s/p exchange on 12/08/16   -Body fluid culture: GNR- ESBL Ecoli -Initially placed on Zosyn, Transitioned to merrem  Atrial fibrillation -Currently rate controlled -Coumadin held for possible biopsy  -continue Coreg -CHADSVASC 4 (based on age, CHF, HTN, DM) -Given that INR is down to 1.14, continue full dose lovenox  C Difficile -C diff positive antigen and toxin -Continue PO vancomycin and florastor -Diarrhea improving   Bilateral renal knee amputation -PT consulted and recommended [facility] -Case management consulted, orders placed for [facility]  Abormal finding in CT imaging of the rectal area -Gen. surgery input greatly appreciated, no evidence of perirectal abscess.  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 Continue IV merrem for treatment of ESBL  Ecoli.  Consultants Gastroenterology Interventional radiology Oncology  General Surgery  Procedures  Exchange of perc chole drain (secondary to leakage)  by IR  Antibiotics   Anti-infectives    Start     Dose/Rate Route Frequency Ordered Stop   12/12/16 1200  meropenem (MERREM) 1 g in sodium chloride 0.9 % 100 mL IVPB     1 g 200 mL/hr over 30 Minutes Intravenous Every 8 hours 12/12/16 1000     12/10/16 1000  vancomycin (VANCOCIN) 50 mg/mL oral solution 125 mg     125 mg Oral 4 times daily 12/10/16 0331 12/24/16 0959   12/09/16 0000  piperacillin-tazobactam (ZOSYN) IVPB 3.375 g  Status:  Discontinued     3.375 g 12.5 mL/hr over 240 Minutes Intravenous Every 8 hours 12/08/16 1808 12/12/16 0951   12/08/16 1730  piperacillin-tazobactam (ZOSYN) IVPB 3.375 g     3.375 g 100 mL/hr over 30 Minutes Intravenous STAT 12/08/16 1722 12/08/16 1810      Subjective:   [name] seen and examined today. [name] [name] denies further dizziness. Would like to know when his tests will be done today. Currently denies chest pain, shortness of breath, abdominal pain, nausea or vomiting, headache.   Objective:   Vitals:   12/14/16 0453 12/14/16 1427 12/14/16 2052 12/15/16 0528  BP: 119/64 107/76 119/72 (!) 116/49  Pulse: 65 95 78 90  Resp: 18 16 18 16   Temp: 98 F (36.7 C) 98.4 F (36.9 C) 99 F (37.2 C) 98.5 F (36.9 C)  TempSrc: Oral Oral Oral Oral  SpO2: 98% 98% 99% 99%  Weight:      Height:        Intake/Output Summary (Last 24 hours) at 12/15/16 1042 Last data filed at 12/15/16 0530  Gross per 24 hour  Intake              500 ml  Output              820 ml  Net             -320 ml   Filed Weights   12/08/16 1130  Weight: 95.3 kg (210 lb)    Exam   General: Well developed, Well nourished, no apparent distress  HEENT: NCAT, mucous membranes moist.   Cardiovascular: S1/S2 auscultated, RRR, no murmurs appreciated  Respiratory: Clear to auscultation  bilateral  Abdomen: Soft, nontender, nondistended, +BS, + cholecystectomy tube with minimal drainage  Extremities: B/L BKA  Neuro: AAOx3, nonfocal  Psych: appropriate mood and affect, pleasant   Data Reviewed: I have personally reviewed following labs and imaging studies  CBC:  Recent Labs Lab 12/08/16 1212  12/11/16 0414 12/12/16 0403 12/13/16 0435 12/14/16 0415 12/15/16 0424  WBC 8.8  < > 6.2 5.2 4.6 4.6 3.8*  NEUTROABS 7.3  --   --   --   --   --   --   HGB 11.2*  < > 9.6* 9.3* 9.2* 9.0* 9.4*  HCT 33.9*  < > 28.8* 28.4* 28.6* 27.7* 28.9*  MCV 72.4*  < > 71.6* 72.3* 72.4* 72.1* 72.6*  PLT 250  < > 274 264 291 295 295  < > = values in this interval not displayed. Basic Metabolic Panel:  Recent Labs Lab 12/10/16 0348 12/11/16 0414 12/12/16 0403 12/12/16 1306 12/14/16 0415  NA 139 138 138 137 138  K 3.5 3.5 3.6 4.1 4.0  CL 108 107 107 107 108  CO2 22 23 21* 23 23  GLUCOSE 191* 211* 154* 217* 187*  BUN 17 16 14 13 11   CREATININE 1.25* 1.33* 1.27* 1.10 1.03  CALCIUM 8.2* 8.2* 8.2*  8.2* 8.3*   GFR: Estimated Creatinine Clearance: 72.7 mL/min (by C-G formula based on SCr of 1.03 mg/dL). Liver Function Tests:  Recent Labs Lab 12/08/16 1212 12/09/16 0351  AST 14* 10*  ALT 19 14*  ALKPHOS 191* 136*  BILITOT 1.0 0.8  PROT 7.6 6.5  ALBUMIN 2.9* 2.5*    Recent Labs Lab 12/08/16 1212  LIPASE 53*   No results for input(s): AMMONIA in the last 168 hours. Coagulation Profile:  Recent Labs Lab 12/08/16 1212 12/09/16 1020 12/10/16 1351 12/11/16 0414  INR 1.84 2.34 1.09 1.14   Cardiac Enzymes: No results for input(s): CKTOTAL, CKMB, CKMBINDEX, TROPONINI in the last 168 hours. BNP (last 3 results) No results for input(s): PROBNP in the last 8760 hours. HbA1C: No results for input(s): HGBA1C in the last 72 hours. CBG:  Recent Labs Lab 12/14/16 0737 12/14/16 1225 12/14/16 1658 12/14/16 2103 12/15/16 0623  GLUCAP 168* 277* 178* 276* 257*    Lipid Profile: No results for input(s): CHOL, HDL, LDLCALC, TRIG, CHOLHDL, LDLDIRECT in the last 72 hours. Thyroid Function Tests: No results for input(s): TSH, T4TOTAL, FREET4, T3FREE, THYROIDAB in the last 72 hours. Anemia Panel: No results for input(s): VITAMINB12, FOLATE, FERRITIN, TIBC, IRON, RETICCTPCT in the last 72 hours. Urine analysis:    Component Value Date/Time   COLORURINE AMBER (A) 12/08/2016 1131   APPEARANCEUR TURBID (A) 12/08/2016 1131   LABSPEC 1.015 12/08/2016 1131   PHURINE 5.0 12/08/2016 1131   GLUCOSEU 150 (A) 12/08/2016 1131   HGBUR MODERATE (A) 12/08/2016 1131   BILIRUBINUR NEGATIVE 12/08/2016 1131   KETONESUR 5 (A) 12/08/2016 1131   PROTEINUR 100 (A) 12/08/2016 1131   UROBILINOGEN 0.2 01/11/2015 1850   NITRITE NEGATIVE 12/08/2016 1131   LEUKOCYTESUR LARGE (A) 12/08/2016 1131   Sepsis Labs: @LABRCNTIP (procalcitonin:4,lacticidven:4)  ) Recent Results (from the past 240 hour(s))  Body fluid culture     Status: Abnormal   Collection Time: 12/08/16  6:24 PM  Result Value Ref Range Status   Specimen Description BILE  Final   Special Requests NONE  Final   Gram Stain   Final    ABUNDANT WBC PRESENT,BOTH PMN AND MONONUCLEAR MODERATE GRAM NEGATIVE RODS    Culture (A)  Final    ESCHERICHIA COLI Confirmed Extended Spectrum Beta-Lactamase Producer (ESBL) Performed at [facility], [place] [address], [place], [place] [address]    Report Status 12/12/2016 FINAL  Final   Organism ID, Bacteria ESCHERICHIA COLI  Final      Susceptibility   Escherichia coli - MIC*    AMPICILLIN >=32 RESISTANT Resistant     CEFAZOLIN >=64 RESISTANT Resistant     CEFEPIME RESISTANT Resistant     CEFTAZIDIME RESISTANT Resistant     CEFTRIAXONE >=64 RESISTANT Resistant     CIPROFLOXACIN >=4 RESISTANT Resistant     GENTAMICIN >=16 RESISTANT Resistant     IMIPENEM <=0.25 SENSITIVE Sensitive     TRIMETH/SULFA <=20 SENSITIVE Sensitive     AMPICILLIN/SULBACTAM 16  INTERMEDIATE Intermediate     PIP/TAZO <=4 SENSITIVE Sensitive     Extended ESBL POSITIVE Resistant     * ESCHERICHIA COLI  MRSA PCR Screening     Status: Abnormal   Collection Time: 12/08/16  6:51 PM  Result Value Ref Range Status   MRSA by PCR POSITIVE (A) NEGATIVE Final    Comment:        The GeneXpert MRSA Assay (FDA approved for NASAL specimens only), is one component of a comprehensive MRSA colonization surveillance program.  It is not intended to diagnose MRSA infection nor to guide or monitor treatment for MRSA infections. CRITICAL RESULT CALLED TO, READ BACK BY AND VERIFIED WITH: B CHERRY RN 0045 12/09/16 A NAVARRO   C difficile quick scan w PCR reflex     Status: Abnormal   Collection Time: 12/09/16  7:47 PM  Result Value Ref Range Status   C Diff antigen POSITIVE (A) NEGATIVE Final   C Diff toxin POSITIVE (A) NEGATIVE Final    Comment: CRITICAL RESULT CALLED TO, READ BACK BY AND VERIFIED WITH: B.CHERRY AT 2224 12/09/16 BY N.THOMPSON    C Diff interpretation Toxin producing C. difficile detected.  Final    Comment: CRITICAL RESULT CALLED TO, READ BACK BY AND VERIFIED WITH: B.CHERRY AT 2224 12/09/16 BY N.THOMPSON       Radiology Studies: No results found.   Scheduled Meds: . carvedilol  6.25 mg Oral BID WC  . insulin aspart  0-5 Units Subcutaneous QHS  . insulin aspart  0-9 Units Subcutaneous TID WC  . insulin detemir  11 Units Subcutaneous BID  . pantoprazole  40 mg Oral BID  . saccharomyces boulardii  250 mg Oral BID  . sacubitril-valsartan  1 tablet Oral BID  . spironolactone  25 mg Oral Daily  . vancomycin  125 mg Oral QID   Continuous Infusions: . meropenem (MERREM) IV 1 g (12/15/16 0437)     LOS: 7 days   Time Spent in minutes   30 minutes  Mihran Lebarron D.O. on 12/15/2016 at 10:42 AM  Between 7am to 7pm - Pager - [phone]  After 7pm go to [email or web address] - password [email or web address]  And look for the night coverage person covering for me after  hours  [facility] Office  [phone]

## 2016-12-15 NOTE — Anesthesia Postprocedure Evaluation (Signed)
Anesthesia Post Note  [name]  Procedure(s) Performed: Procedure(s) (LRB): UPPER ENDOSCOPIC ULTRASOUND (EUS) LINEAR (Left)  Patient location during evaluation: PACU Anesthesia Type: MAC Level of consciousness: awake and alert Pain management: pain level controlled Vital Signs Assessment: post-procedure vital signs reviewed and stable Respiratory status: spontaneous breathing, nonlabored ventilation, respiratory function stable and patient connected to nasal cannula oxygen Cardiovascular status: stable and blood pressure returned to baseline Anesthetic complications: no       Last Vitals:  Vitals:   12/15/16 1318 12/15/16 1320  BP: (!) 94/54 (!) 94/56  Pulse: 77 73  Resp: 16 16  Temp:      Last Pain:  Vitals:   12/15/16 1313  TempSrc: Oral  PainSc: 0-No pain                 Winifred Balogh S

## 2016-12-15 NOTE — Progress Notes (Signed)
Nutrition Follow-up  DOCUMENTATION CODES:   Obesity unspecified  INTERVENTION:   Provide Premier Protein BID, each supplement provides 160kcal and 30g protein.  RD to continue to monitor  NUTRITION DIAGNOSIS:   Increased nutrient needs related to cancer and cancer related treatments as evidenced by estimated needs.  Ongoing.  GOAL:   Patient will meet greater than or equal to 90% of their needs  Progressing.  MONITOR:   PO intake, Supplement acceptance, Labs, Weight trends, I & O's  ASSESSMENT:    71 y.o. 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 He is s/p a cholecystostomy tube in February that was replaced yesterday. He denies abdominal pain, nausea, and vomiting. Has been losing weight but unable to tell me how much. ALP mildly elevated at 136 and other liver enzymes are nromal. On chronic Coumadin. S/P bilateral BKA.   At time of visit, pt was in endoscopy for an ultrasound. Pt was NPO at that time.    PO intakes over the past 72 hours: 4/20: FLD Breakfast 100% (200 kcal, 3g protein), Dinner 100% (150 kcal, 2g protein) 4/21: CHO mod diet, Lunch 85% (350 kcal, 15g protein), Dinner 75% (330 kcal, 19g protein) 4/22: CLD, 50% completion of one tray (145 kcal, 2g protein)  Patient has been eating but is not meeting needs. Will order Premier Protein supplements.  Medications: Florastor capsule BID Labs reviewed: CBGs: 153-257  Diet Order:  DIET SOFT Room service appropriate? Yes; Fluid consistency: Thin  Skin:  Reviewed, no issues  Last BM:  4/21  Height:   Ht Readings from Last 1 Encounters:  12/08/16 5\' 9"  (1.753 m)    Weight:   Wt Readings from Last 1 Encounters:  12/08/16 210 lb (95.3 kg)    Ideal Body Weight:  64 kg (adjusted for bilateral BKA)  BMI:  Body mass index is 31.01 kg/m.  Estimated Nutritional Needs:   Kcal:   1900-2100  Protein:  90-100g  Fluid:  2L/day  EDUCATION NEEDS:   No education needs identified at this time  Clayton Bibles, MS, RD, LDN Pager: [phone] After Hours Pager: [phone]

## 2016-12-15 NOTE — Op Note (Signed)
[facility] Patient Name: [name] [name] Procedure Date: 12/15/2016 MRN: [id number] Attending MD: Arta Silence , MD Date of Birth: 04/19/1946 CSN: [id number] Age: 71 Admit Type: Inpatient Procedure:                Upper EUS Indications:              Dilated pancreatic duct on CT scan, Suspected mass                            in pancreas on CT scan, Suspected solid pancreatic                            neoplasm, Epigastric abdominal pain, Abdominal pain                            in the right upper quadrant, Weight loss Providers:                Arta Silence, MD, Zenon Mayo, RN, Destany Severns Dalton, Technician Referring MD:             [facility] Medicines:                Monitored Anesthesia Care Complications:            No immediate complications. Estimated Blood Loss:     Estimated blood loss: none. Procedure:                Pre-Anesthesia Assessment:                           - Prior to the procedure, a History and Physical                            was performed, and patient medications and                            allergies were reviewed. The patient's tolerance of                            previous anesthesia was also reviewed. The risks                            and benefits of the procedure and the sedation                            options and risks were discussed with the patient.                            All questions were answered, and informed consent                            was obtained. Prior Anticoagulants: The patient has  taken Lovenox (enoxaparin), last dose was 1 day                            prior to procedure. ASA Grade Assessment: IV - A                            patient with severe systemic disease that is a                            constant threat to life. After reviewing the risks                            and benefits, the patient was deemed in               satisfactory condition to undergo the procedure.                           After obtaining informed consent, the endoscope was                            passed under direct vision. Throughout the                            procedure, the patient's blood pressure, pulse, and                            oxygen saturations were monitored continuously. The                            [id number] ([id number]) scope was introduced through                            the mouth, and advanced to the prepyloric region,                            stomach. The upper EUS was accomplished without                            difficulty. The patient tolerated the procedure                            well. Scope In: 12:35:36 PM Scope Out: 12:36:22 PM Total Procedure Duration: 0 hours 0 minutes 46 seconds  Findings:      Endosonographic Finding :      There was no sign of significant endosonographic abnormality in the left       lobe of the liver.      The region of the celiac plexus and celiac ganglia was visualized and       showed no sign of significant endosonographic abnormality. The vascular       anatomy of the region was normal.      No lymphadenopathy seen.      Pancreatic parenchymal abnormalities were noted in the genu of the       pancreas and in the pancreatic body. These consisted of atrophy.  A round mass was identified in the pancreatic body. The mass was       hypoechoic. The mass measured 25 mm by 17 mm in maximal cross-sectional       diameter. The endosonographic borders were poorly-defined. There was       sonographic evidence suggesting invasion into the superior mesenteric       artery (manifested by no vessel visualization) and the celiac trunk. An       intact interface was seen between the mass and the superior mesenteric       artery suggesting a lack of invasion. The remainder of the pancreas was       examined. The endosonographic appearance of parenchyma and the  upstream       pancreatic duct indicated duct dilation and parenchymal atrophy. Fine       needle aspiration for cytology was performed. Color Doppler imaging was       utilized prior to needle puncture to confirm a lack of significant       vascular structures within the needle path. Two passes were made with       the 25 gauge needle using a transgastric approach. A stylet was used. A       cytologist was present and performed a preliminary cytologic       examination. Preliminary cytology is suspicious for adenocarcinoma       (final results are pending). Impression:               - There was no evidence of significant pathology in                            the left lobe of the liver.                           - Pancreatic parenchymal abnormalities consisting                            of atrophy were noted in the genu of the pancreas                            and in the pancreatic body.                           - A mass was identified in the pancreatic body.                            This was staged T3 N0 Mx by endosonographic                            criteria. Fine needle aspiration performed.                            Preliminary results consistent with adenocarcinoma. Moderate Sedation:      None Recommendation:           - Return patient to hospital ward for ongoing care.                           - Soft diet today.                           -  Resume Lovenox (enoxaparin) at prior dose tonight.                           - Await cytology results.                           [name] will sign-off; we can arrange outpatient                            follow-up with [place]; please call with any questions;                            thank you for the consultation. Procedure Code(s):        --- Professional ---                           [phone], Esophagogastroduodenoscopy, flexible,                            transoral; with transendoscopic ultrasound-guided                             intramural or transmural fine needle                            aspiration/biopsy(s) (includes endoscopic                            ultrasound examination of the esophagus, stomach,                            and either the duodenum or a surgically altered                            stomach where the jejunum is examined distal to the                            anastomosis) Diagnosis Code(s):        --- Professional ---                           K86.9, Disease of pancreas, unspecified                           K86.89, Other specified diseases of pancreas                           R10.13, Epigastric pain                           R10.11, Right upper quadrant pain                           R63.4, Abnormal weight loss                           R93.3, Abnormal findings  on diagnostic imaging of                            other parts of digestive tract CPT copyright 2016 [employer]. All rights reserved. The codes documented in this report are preliminary and upon coder review may  be revised to meet current compliance requirements. Arta Silence, MD 12/15/2016 1:09:27 PM This report has been signed electronically. Number of Addenda: 0

## 2016-12-15 NOTE — Anesthesia Preprocedure Evaluation (Signed)
Anesthesia Evaluation  Patient identified by MRN, date of birth, ID band Patient awake  General Assessment Comment:[name] [name]  is a 71 y.o. male, with medical history significant of NICM status post ICD, CHF with EF to 20-25% in 2014, DM-2, CKD, PAF on coumadin, bilateral BKA , with recent hospitalization secondary to cholecystitis, where patient discharge with cholecystostomy tube, patient presents with progressive weakness, failure to thrive, no appetite weight loss, intermittent fever as well secondary to malfunction and cholecystomy drain as bag has been disconnected, patient is a febrile NAD, with no leukocytosis, but CT abdomen and pelvis showing evidence of pancreatic mass or pancreatic ductal obstruction,, with possible liver metastases, and abnormal finding of perirectal area suspicious for abscess  Reviewed: Allergy & Precautions, NPO status , Patient's Chart, lab work & pertinent test results  Airway Mallampati: II  TM Distance: >3 FB Neck ROM: Full    Dental no notable dental hx.    Pulmonary neg pulmonary ROS,    Pulmonary exam normal breath sounds clear to auscultation       Cardiovascular hypertension, + CAD, + Peripheral Vascular Disease and +CHF  Normal cardiovascular exam+ dysrhythmias + Cardiac Defibrillator  Rhythm:Regular Rate:Normal     Neuro/Psych negative neurological ROS  negative psych ROS   GI/Hepatic Neg liver ROS, GERD  ,  Endo/Other  diabetes  Renal/GU negative Renal ROS  negative genitourinary   Musculoskeletal negative musculoskeletal ROS (+)   Abdominal   Peds negative pediatric ROS (+)  Hematology  (+) anemia ,   Anesthesia Other Findings   Reproductive/Obstetrics negative OB ROS                             Anesthesia Physical Anesthesia Plan  ASA: IV  Anesthesia Plan: MAC   Post-op Pain Management:    Induction: Intravenous  Airway Management  Planned: Nasal Cannula  Additional Equipment:   Intra-op Plan:   Post-operative Plan:   Informed Consent: I have reviewed the patients History and Physical, chart, labs and discussed the procedure including the risks, benefits and alternatives for the proposed anesthesia with the patient or authorized representative who has indicated his/her understanding and acceptance.   Dental advisory given  Plan Discussed with: CRNA and Surgeon  Anesthesia Plan Comments:         Anesthesia Quick Evaluation

## 2016-12-15 NOTE — Progress Notes (Signed)
PT Cancellation Note  Patient Details Name: [name] [name] MRN: [id number] DOB: Apr 22, 1946   Cancelled Treatment:     out of room for EUS.  Will check back another day as schedule permits.   Rica Koyanagi  PTA [facility]  Acute  Rehab Pager      [phone]

## 2016-12-15 NOTE — Progress Notes (Signed)
Pharmacy Antibiotic Note  [name] [name] is a 71 y.o. male admitted on 12/08/2016.  Patient with bile drained and culture growing Ecoli ESBL pharmacy to dose meropenem. Patient also Day 6/14 of PO vanc for Cdiff colitis  Plan: - continue meropenem 1g IV q8 for CrCl>24mls/min - PO Vanc for Cdiff colitis per Md  Height: 5\' 9"  (175.3 cm) Weight: 210 lb (95.3 kg) IBW/kg (Calculated) : 70.7  Temp (24hrs), Avg:98.6 F (37 C), Min:98.4 F (36.9 C), Max:99 F (37.2 C)   Recent Labs Lab 12/08/16 1221  12/10/16 0348 12/11/16 0414 12/12/16 0403 12/12/16 1306 12/13/16 0435 12/14/16 0415 12/15/16 0424  WBC  --   < > 8.0 6.2 5.2  --  4.6 4.6 3.8*  CREATININE  --   < > 1.25* 1.33* 1.27* 1.10  --  1.03  --   LATICACIDVEN 1.28  --   --   --   --   --   --   --   --   < > = values in this interval not displayed.  Estimated Creatinine Clearance: 72.7 mL/min (by C-G formula based on SCr of 1.03 mg/dL).    No Known Allergies  Antimicrobials this admission: 4/16 >> Zosyn >> 4/20 4/20 Meropenem >> 4/18 PO Vanc >> 5/2  Dose adjustments this admission: --  Microbiology results: 4/16 MRSA PCR: positive 4/16 bile: GNR - Ecoli ESBL 4/17 Cdiff: positive   Thank you for allowing pharmacy to be a part of this patient's care.  Dolly Rias RPh 12/15/2016, 10:20 AM Pager [phone]

## 2016-12-15 NOTE — Transfer of Care (Signed)
Immediate Anesthesia Transfer of Care Note  [name]  Procedure(s) Performed: Procedure(s): UPPER ENDOSCOPIC ULTRASOUND (EUS) LINEAR (Left)  Patient Location: PACU  Anesthesia Type:MAC  Level of Consciousness: awake, alert  and drowsy  Airway & Oxygen Therapy: Patient Spontanous Breathing and Patient connected to nasal cannula oxygen  Post-op Assessment: Report given to RN and Post -op Vital signs reviewed and stable  Post vital signs: Reviewed and stable  Last Vitals:  Vitals:   12/15/16 0528 12/15/16 1208  BP: (!) 116/49 122/72  Pulse: 90 97  Resp: 16 20  Temp: 36.9 C     Last Pain:  Vitals:   12/15/16 0800  TempSrc:   PainSc: 0-No pain      Patients Stated Pain Goal: 0 (09/81/19 1478)  Complications: No apparent anesthesia complications

## 2016-12-15 NOTE — Progress Notes (Signed)
Dressing changed to biliary tube, greenish drainage noted on old dressing. Appetite very good. Wife was at the bedside.

## 2016-12-15 NOTE — Interval H&P Note (Signed)
History and Physical Interval Note:  12/15/2016 12:06 PM  [place]  has presented today for surgery, with the diagnosis of pancreatic mass  The various methods of treatment have been discussed with the patient and family. After consideration of risks, benefits and other options for treatment, the patient has consented to  Procedure(s): UPPER ENDOSCOPIC ULTRASOUND (EUS) LINEAR (Left) as a surgical intervention .  The patient's history has been reviewed, patient examined, no change in status, stable for surgery.  I have reviewed the patient's chart and labs.  Questions were answered to the patient's satisfaction.     Alon Mazor M   Assessment:  1.  Pancreatitic mass. 2.  Recent cholecystitis with cholecystostomy tube in place. 3.  Chronic anticoagulation.  Plan:  1.  Upper endoscopic ultrasound with possible fine needle aspiration of pancreatic mass. 2.  Risks (bleeding, infection, bowel perforation that could require surgery, sedation-related changes in cardiopulmonary systems), benefits (identification and possible treatment of source of symptoms, exclusion of certain causes of symptoms), and alternatives (watchful waiting, radiographic imaging studies, empiric medical treatment) of upper endoscopy with ultrasound and possible fine needle aspiration (EUS +/- FNA) were explained to patient/family in detail and patient wishes to proceed.

## 2016-12-16 ENCOUNTER — Encounter (HOSPITAL_COMMUNITY): Payer: Self-pay | Admitting: Gastroenterology

## 2016-12-16 DIAGNOSIS — E1140 Type 2 diabetes mellitus with diabetic neuropathy, unspecified: Secondary | ICD-10-CM

## 2016-12-16 DIAGNOSIS — Z794 Long term (current) use of insulin: Secondary | ICD-10-CM

## 2016-12-16 LAB — BASIC METABOLIC PANEL
Anion gap: 5 (ref 5–15)
BUN: 10 mg/dL (ref 6–20)
CHLORIDE: 109 mmol/L (ref 101–111)
CO2: 25 mmol/L (ref 22–32)
CREATININE: 0.95 mg/dL (ref 0.61–1.24)
Calcium: 8.4 mg/dL — ABNORMAL LOW (ref 8.9–10.3)
GFR calc Af Amer: >60 mL/min (ref >60)
GFR calc non Af Amer: >60 mL/min (ref >60)
GLUCOSE: 250 mg/dL — AB (ref 65–99)
Potassium: 4.3 mmol/L (ref 3.5–5.1)
SODIUM: 139 mmol/L (ref 135–145)

## 2016-12-16 LAB — GLUCOSE, CAPILLARY
Glucose-Capillary: 230 mg/dL — ABNORMAL HIGH (ref 65–99)
Glucose-Capillary: 288 mg/dL — ABNORMAL HIGH (ref 65–99)

## 2016-12-16 LAB — CBC
HCT: 28.6 % — ABNORMAL LOW (ref 39.0–52.0)
HEMOGLOBIN: 9.2 g/dL — AB (ref 13.0–17.0)
MCH: 23.5 pg — AB (ref 26.0–34.0)
MCHC: 32.2 g/dL (ref 30.0–36.0)
MCV: 73.1 fL — AB (ref 78.0–100.0)
Platelets: 288 10*3/uL (ref 150–400)
RBC: 3.91 MIL/uL — ABNORMAL LOW (ref 4.22–5.81)
RDW: 19 % — ABNORMAL HIGH (ref 11.5–15.5)
WBC: 4.2 10*3/uL (ref 4.0–10.5)

## 2016-12-16 LAB — PROTIME-INR
INR: 1.09
PROTHROMBIN TIME: 14.1 s (ref 11.4–15.2)

## 2016-12-16 MED ORDER — ENOXAPARIN SODIUM 100 MG/ML ~~LOC~~ SOLN: 1.0000 mg/kg | Freq: Two times a day (BID) | SUBCUTANEOUS | 0 refills | Status: DC

## 2016-12-16 MED ORDER — ALUM & MAG HYDROXIDE-SIMETH 200-200-20 MG/5ML PO SUSP: 15.0000 mL | ORAL | 0 refills | Status: DC | PRN

## 2016-12-16 MED ORDER — VANCOMYCIN 50 MG/ML ORAL SOLUTION: 125.0000 mg | Freq: Four times a day (QID) | ORAL | 0 refills | Status: DC

## 2016-12-16 MED ORDER — WARFARIN SODIUM 5 MG PO TABS: 10.0000 mg | ORAL_TABLET | Freq: Every day | ORAL | Status: DC

## 2016-12-16 MED ORDER — PREMIER PROTEIN SHAKE: 11.0000 [oz_av] | Freq: Two times a day (BID) | ORAL | 0 refills | Status: DC

## 2016-12-16 MED ORDER — SACCHAROMYCES BOULARDII 250 MG PO CAPS
250.0000 mg | ORAL_CAPSULE | Freq: Two times a day (BID) | ORAL | 0 refills | Status: DC
Start: 2016-12-16 — End: 2017-05-15

## 2016-12-16 MED ORDER — WARFARIN SODIUM 10 MG PO TABS: 10.0000 mg | ORAL_TABLET | Freq: Every day | ORAL | 0 refills | Status: DC

## 2016-12-16 MED ORDER — WARFARIN - PHARMACIST DOSING INPATIENT: Freq: Every day | Status: DC

## 2016-12-16 NOTE — Care Management Important Message (Addendum)
Important Message  Patient Details IMLetter given to Nora/Case Manager to present to Patient Name: [name] [name] MRN: [id number] Date of Birth: 02-09-1946   Medicare Important Message Given:  Yes    [name] 12/16/2016, 10:37 AMImportant Message  Patient Details  Name: [name] [name] MRN: [id number] Date of Birth: [birth year]   Medicare Important Message Given:  Yes    [name] 12/16/2016, 10:37 AM

## 2016-12-16 NOTE — Discharge Instructions (Signed)
Flush drain with 5-10cc of normal saline daily.  Empty and record drain output daily and bring it with you to your follow up appointment

## 2016-12-16 NOTE — Progress Notes (Addendum)
ANTICOAGULATION CONSULT NOTE  Pharmacy Consult for Warfarin Indication: Atrial Fibrillation  No Known Allergies  Patient Measurements: Height: 5\' 9"  (175.3 cm) Weight: 210 lb (95.3 kg) IBW/kg (Calculated) : 70.7   Vital Signs: Temp: 98.[name] F (36.7 C) (04/24 0534) Temp Source: Oral (04/24 0534) BP: 107/69 (04/24 0534) Pulse Rate: 95 (04/24 0534)  Labs:  Recent Labs  12/14/16 0415 12/15/16 0424 12/16/16 0407 12/16/16 1149  HGB 9.0* 9.4* 9.2*  --   HCT 27.7* 28.9* 28.6*  --   PLT 295 295 288  --   LABPROT  --   --   --  14.[name]  INR  --   --   --  [name].09  CREATININE [name].03  --  0.95  --     Estimated Creatinine Clearance: 78.9 mL/min (by C-G formula based on SCr of 0.95 mg/dL).   Medications:  Scheduled:  . carvedilol  6.25 mg Oral BID WC  . enoxaparin (LOVENOX) injection  [name] mg/kg Subcutaneous Q12H  . insulin aspart  0-5 Units Subcutaneous QHS  . insulin aspart  0-9 Units Subcutaneous TID WC  . insulin detemir  11 Units Subcutaneous BID  . pantoprazole  40 mg Oral BID  . protein supplement shake  11 oz Oral BID BM  . saccharomyces boulardii  250 mg Oral BID  . sacubitril-valsartan  [name] tablet Oral BID  . spironolactone  25 mg Oral Daily  . vancomycin  125 mg Oral QID  . warfarin  10 mg Oral q1800  . Warfarin - Pharmacist Dosing Inpatient   Does not apply q1800   Infusions:  . sodium chloride Stopped (12/16/16 0704)  . meropenem (MERREM) IV Stopped (12/16/16 1232)   PRN: acetaminophen **OR** acetaminophen, albuterol, alum & mag hydroxide-simeth, HYDROcodone-acetaminophen  Assessment: 71 y/o [name] [name] on chronic warfarin for atrial fibrillation, most recent PTA dosage 10 mg daily except 15 mg on Wednesday, had warfarin held and then reversed with vitamin K 10 mg IV on 4/17 for biopsies, which have now been completed.   Full-dose Lovenox 95 mg (1mg /kg) SQ q12h was resumed 4/23 post-procedure. Orders received today to resume warfarin with pharmacy dosing assistance.   Goal  of Therapy:  INR 2-3    Plan:  Warfarin 10 mg PO daily PT/INR daily while inpatient Continue Lovenox 95 mg SQ q12h until INR 2 or above After discharge, patient to follow-up in cardiology office 4/27 for INR check (see MD discharge summary).  Clayburn Pert, PharmD, BCPS Pager: [phone] 12/16/2016  [name]:10 PM

## 2016-12-16 NOTE — Progress Notes (Signed)
Pt discharged home from unit via wheelchair with spouse in stable condition.  Discharge instructions and scripts given. Pt verbalized understanding.  No immediate questions or concerns at this time.

## 2016-12-16 NOTE — Progress Notes (Signed)
This CM spoke with pt wife on 4/23 to inform her that per [facility] DME rep pt does not qualify for a new wheelchair as he received one in 2015. Pt wife informed that she can take the wheelchair to the [facility] DME store to have it repaired.  This CM confirmed this am that [facility] received HHPT/RN orders and they were informed of pt discharge. Marney Doctor RN,BSN,NCM [phone]

## 2016-12-16 NOTE — Progress Notes (Signed)
Physical Therapy Treatment Patient Details Name: [name] [name] MRN: [id number] DOB: 03-29-1946 Today's Date: 12/16/2016    History of Present Illness [name] [name]  is a 71 y.o. 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  Assisted OOB to amb in hallway thenpositioned in recliner.  Pt plans to D/C to home later today.   Follow Up Recommendations  Home health PT;Supervision/Assistance - 24 hour     Equipment Recommendations  None recommended by PT    Recommendations for Other Services       Precautions / Restrictions Precautions Precaution Comments: right drain from back Required Braces or Orthoses: Other Brace/Splint Other Brace/Splint: bil BKA prosthetics Restrictions Weight Bearing Restrictions: No Other Position/Activity Restrictions: WBAT    Mobility  Bed Mobility Overal bed mobility: Needs Assistance Bed Mobility: Supine to Sit           General bed mobility comments: use of rail and Min Assist for upper body  Transfers Overall transfer level: Needs assistance Equipment used: Rolling walker (2 wheeled) Transfers: Sit to/from Stand Sit to Stand: Min guard         General transfer comment: pt able to self rise from elevated bed with increased time and control decend with stand to  sit with approriate use of hands  Ambulation/Gait Ambulation/Gait assistance: Min guard;Supervision Ambulation Distance (Feet): 42 Feet Assistive device: Rolling walker (2 wheeled) Gait Pattern/deviations: Step-through pattern;Wide base of support Gait velocity: WFL   General Gait Details: amb well with his B LE prosthesis and RW.  Pt demonstartes good safety cognition and self awareness   Stairs            Wheelchair Mobility    Modified Rankin (Stroke Patients Only)       Balance                                            Cognition Arousal/Alertness: Awake/alert Behavior During Therapy: WFL for tasks assessed/performed Overall Cognitive Status: Within Functional Limits for tasks assessed                                        Exercises      General Comments        Pertinent Vitals/Pain Pain Assessment: No/denies pain    Home Living                      Prior Function            PT Goals (current goals can now be found in the care plan section) Progress towards PT goals: Progressing toward goals    Frequency    Min 3X/week  PT Plan Current plan remains appropriate    Co-evaluation             End of Session Equipment Utilized During Treatment: Gait belt Activity Tolerance: Patient tolerated treatment well Patient left: in chair;with call bell/phone within reach;with chair alarm set Nurse Communication: Mobility status PT Visit Diagnosis: Difficulty in walking, not elsewhere classified (R26.2)     Time: 1287-8676 PT Time Calculation (min) (ACUTE ONLY): 27 min  Charges:  $Gait Training: 8-22 mins $Therapeutic Activity: 8-22 mins                    G Codes:       {Ettamae Barkett  PTA [facility]  Acute  Rehab Pager      [phone]

## 2016-12-16 NOTE — Discharge Summary (Signed)
Physician Discharge Summary  [name] [name] MPN:[id number] DOB: October 09, 1945 DOA: 12/08/2016  PCP: [name], MD  Admit date: 12/08/2016 Discharge date: 12/16/2016  Time spent: 45 minutes  Recommendations for Outpatient Follow-up:  Patient will be discharged to home with home health services, nursing and physical therapy.  Patient will need to follow up with primary care provider within one week of discharge, repeat CBC. Follow up with Dr. Lindi Adie, oncologist, 12/24/2016 at 11:15am. Follow up with cardiology office in 3 days (4/27) for INR check. Patient should continue medications as prescribed.  Patient should follow a soft diet advanced to heart healthy/carb modified.   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 Phillips Climes [name] a 71 y.o.male,with medical history significant of NICM status post ICD, CHFwith EF to 20-25% in 2014, DM-2, CKD, PAF on coumadin, bilateral BKA , with recent hospitalization secondary to cholecystitis, where patient discharge with cholecystostomy tube, patient presents with progressive weakness, failure to thrive, no appetite weight loss, intermittent fever as well secondary to malfunction and cholecystomy drain as bag has been disconnected, patient is a febrile NAD, with no leukocytosis, but CT abdomen and pelvis showing evidence of pancreatic mass or pancreatic ductal obstruction,, with  possible liver metastases, and abnormal finding of perirectal area suspicious for abscess, patient denies any chest pain, shortness of breath, dysuria, polyuria, cough or productive sputum, wife reports he is with progressive weakness, sickly looking on the provided entire weekend, with no appetite and oral intake, I was called to admit.  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 EUS was recommended, however, will not be done until sometime next week.  -INR improved with vitamin K, currently 1.14 -General surgery consulted and appreciated, however patient not a surgical candidate for resection of Whipple  -Interventional radiology consulted and appreciated, drain was exchanged  -Oncology consulted and appreciated, pending EUS/biospy. If biopsy shows metastatic panc ca, palliative care consult would be appropriate -[id number]  -s/p EUS 12/15/2016 by Dr. Paulita Fujita: No evidence of significant pathology in the left lobe of the liver. A mass is identified in pancreatic body staged T3 N0 Mx by endosonographic criteria. FNA done. Preliminary results consistent with adenocarcinoma. -discussed findings with oncology, Dr. Lindi Adie, who will follow up with the patient as an outpatient as pathology results still pending.   Failure to thrive, progressive weakness -Likely secondary to the above -PT consulted recommended HH  Cholecystitis -Chronic, status post cholecystostomy, tube was broken and leaking -IR consulted, s/p exchange on 12/08/16   -Body fluid culture: GNR- ESBL Ecoli -Initially placed on Zosyn, Transitioned to merrem (patient has received 7 days of antibiotic therapy) -Spoke with infectious disease, Dr. Johnnye Sima. Patient did not have any signs of her symptoms of sepsis therefore would not continue treatment.  Atrial  fibrillation -Currently rate controlled -Coumadin held for possible biopsy -continueCoreg -CHADSVASC 4 (based on age, CHF, HTN, DM) -Given that INR is  down to 1.14, continue full dose lovenox -Will discharge patient with Lovenox injections to bridge to Coumadin. -Patient will need to have INR rechecked in 3 days, 12/19/2016.  C Difficile -C diff positive antigen and toxin -Continue PO vancomycin and florastor -Diarrhea improving   Bilateral renal knee amputation -PT consulted and recommended HH -Case management consulted, orders placed for [facility]  Abormal finding in CT imaging of the rectal area -Gen. surgery input greatly appreciated, no evidence of perirectal abscess.  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% status post AICD placement -Last Echocardiogram 10/14/16: EF 20-25%, diffuse hypokinesis   -continue Coreg, Aldactone, Entresto -Lasix held -Continue to monitor intake/output, daily weights -Currently appears to be euvolemic   Hyperlipidemia -Statin held   Hypertension -BP currently stable -Continue Coreg, Aldactone, Arboriculturist Gastroenterology Interventional radiology Oncology  General Surgery  Procedures  Exchange of perc chole drain (secondary to leakage) by IR  Discharge Exam: Vitals:   12/15/16 2150 12/16/16 0534  BP: 107/69 107/69  Pulse: 96 95  Resp: 16 16  Temp: 98.6 F (37 C) 98.1 F (36.7 C)     General: Well developed, well nourished, NAD, appears stated age  HEENT: NCAT, PERRLA, EOMI, Anicteic Sclera, mucous membranes moist.  Neck: Supple, no JVD, no masses  Cardiovascular: S1 S2 auscultated, no rubs, murmurs or gallops. Regular rate and  rhythm.  Respiratory: Clear to auscultation bilaterally with equal chest rise  Abdomen: Soft, nontender, nondistended, + bowel sounds  Extremities: warm dry without cyanosis clubbing or edema  Neuro: AAOx3, cranial nerves grossly intact. Strength 5/5 in patient's upper and lower extremities bilaterally  Skin: Without rashes exudates or nodules  Psych: Normal affect and demeanor with intact judgement and insight  Discharge Instructions Discharge Instructions    Discharge instructions    Complete by:  As directed    Patient will be discharged to home with home health services, nursing and physical therapy.  Patient will need to follow up with primary care provider within one week of discharge, repeat CBC. Follow up with Dr. Lindi Adie, oncologist. Follow up with cardiology office in 3 days (4/27) for INR check. Patient should continue medications as prescribed.  Patient should follow a soft diet advance to heart healthy/carb modified.     Current Discharge Medication List    START taking these medications   Details  alum & mag hydroxide-simeth (MAALOX/MYLANTA) 200-200-20 MG/5ML suspension Take 15 mLs by mouth every 4 (four) hours as needed for indigestion or heartburn. Qty: 355 mL, Refills: 0    enoxaparin (LOVENOX) 100 MG/ML injection Inject 0.95 mLs (95 mg total) into the skin every 12 (twelve) hours. Qty: 14 Syringe, Refills: 0    protein supplement shake (PREMIER PROTEIN) LIQD Take 325 mLs (11 oz total) by mouth 2 (two) times daily between meals. Qty: 60 Can, Refills: 0    saccharomyces boulardii (FLORASTOR) 250 MG capsule Take 1 capsule (250 mg total) by mouth 2 (two) times daily. Qty: 14 capsule, Refills: 0    vancomycin (VANCOCIN) 50 mg/mL oral solution Take 2.5 mLs (125 mg total) by mouth 4 (four) times daily. Qty: 70 mL, Refills: 0      CONTINUE these medications which have CHANGED   Details  warfarin (COUMADIN) 10 MG tablet Take  1 tablet (10 mg total) by mouth  daily. Qty: 30 tablet, Refills: 0      CONTINUE these medications which have NOT CHANGED   Details  acetaminophen (TYLENOL) 325 MG tablet Take 2 tablets (650 mg total) by mouth every 6 (six) hours as needed for mild pain (or Fever >/= 101).    atorvastatin (LIPITOR) 10 MG tablet Take 1 tablet (10 mg total) by mouth daily. Qty: 30 tablet, Refills: 1    Cholecalciferol (VITAMIN D3) 10000 units TABS Take 1 tablet by mouth daily.    furosemide (LASIX) 40 MG tablet Take 1 tablet (40 mg total) by mouth daily. Qty: 30 tablet, Refills: 1    insulin detemir (LEVEMIR) 100 UNIT/ML injection Inject 0.15 mLs (15 Units total) into the skin 2 (two) times daily. Qty: 10 mL, Refills: 0    iron polysaccharides (NIFEREX) 150 MG capsule Take 1 capsule (150 mg total) by mouth daily. Qty: 30 capsule, Refills: 1    Multiple Vitamin (MULTIVITAMIN WITH MINERALS) TABS tablet Take 1 tablet by mouth daily.    omeprazole (PRILOSEC) 20 MG capsule Take 20 mg by mouth daily.    sacubitril-valsartan (ENTRESTO) 24-26 MG Take 1 tablet by mouth 2 (two) times daily. Qty: 60 tablet, Refills: 6    spironolactone (ALDACTONE) 25 MG tablet Take 1 tablet (25 mg total) by mouth daily. Qty: 30 tablet, Refills: 3    carvedilol (COREG) 6.25 MG tablet Take 1 tablet (6.25 mg total) by mouth 2 (two) times daily with a meal. Qty: 60 tablet, Refills: 0    pregabalin (LYRICA) 75 MG capsule Take 1 capsule (75 mg total) by mouth 2 (two) times daily. Qty: 60 capsule, Refills: 1      STOP taking these medications     metroNIDAZOLE (FLAGYL) 500 MG tablet        No Known Allergies Follow-up Information    WAGNER, JAIME, DO Follow up on 12/31/2016.   Specialty:  Interventional Radiology Why:  at 11:00am.   arrive by 10:45am Contact information: [place] [address] [facility] Follow up.   Specialty:  Home Health Services Why:  Home Health Physical  Therapy and RN Contact information: [address] [place] [address] [phone]        Rulon Eisenmenger, MD. Go on 12/24/2016.   Specialty:  Hematology and Oncology Why:  at 11:15am, hospital follow up Contact information: [place] [phone] [place] Office. Go on 12/19/2016.   Specialty:  Cardiology Why:  INR check Contact information: [address] [place]       [name], MD. Schedule an appointment as soon as possible for a visit in 1 week(s).   Specialty:  Internal Medicine Why:  Hospital follow up Contact information: [address] [place] [address] [phone]            The results of significant diagnostics from this hospitalization (including imaging, microbiology, ancillary and laboratory) are listed below for reference.    Significant Diagnostic Studies: Dg Chest 2 View  Result Date: 12/08/2016 CLINICAL DATA:  Increasing generalized weakness since Friday, per wife pt also has gb stent that broke and is draining everywhere, no other chest complaints EXAM: CHEST  2 VIEW COMPARISON:  10/17/2016 FINDINGS: There is a linear band  of airspace disease in the right middle lobe likely reflecting atelectasis. There is no pleural effusion or pneumothorax. The heart and mediastinal contours are unremarkable. There is a 3 lead cardiac pacemaker. The osseous structures are unremarkable. IMPRESSION: No active cardiopulmonary disease. Electronically Signed   By: Kathreen Devoid   On: 12/08/2016 11:57   Ct Abdomen Pelvis W Contrast  Result Date: 12/08/2016 CLINICAL DATA:  Generalized weakness. History of cholecystostomy tube. EXAM: CT ABDOMEN AND PELVIS WITH CONTRAST TECHNIQUE: Multidetector CT imaging of the abdomen and pelvis was performed using the standard protocol following bolus administration of intravenous contrast.  CONTRAST:  110mL ISOVUE-300 IOPAMIDOL (ISOVUE-300) INJECTION 61% COMPARISON:  10/17/2016 FINDINGS: Lower chest:  Bibasilar dependent atelectasis. Hepatobiliary: 13 mm subcapsular lesion lateral segment left liver with hyper perfusion in the surrounding parenchyma. This appears progressed since prior study. 13 mm hypervascular lesion noted in the hepatic dome (image 15 series 2). The cholecystostomy tube visualized with loop formed in the region of the gallbladder neck. Gas in the gallbladder lumen is compatible with the presence of the drain. Insert no biliary Pancreas: 2.1 x 2.5 cm lesion identified in the body the pancreas with markedly atrophic pancreatic tail and dilated duct in the tail the pancreas that terminates at the level of the mass. Spleen: No splenomegaly. No focal mass lesion. Adrenals/Urinary Tract: No adrenal nodule or mass. Kidneys unremarkable. No evidence for hydroureter. Left bladder wall obscured by streak artifact from hip replacement. Stomach/Bowel: Stomach is nondistended. No gastric wall thickening. No evidence of outlet obstruction. Duodenum is normally positioned as is the ligament of Treitz. No small bowel wall thickening. No small bowel dilatation. The terminal ileum is normal. The appendix is normal. Irregular wall thickening in the hepatic flexure likely secondary to the gallbladder process. Transverse and descending colon unremarkable. Diverticular changes noted sigmoid colon circumferential wall thickening is seen in the rectum and there is a 1.6 cm rim enhancing structure just posterior to the anal rectal junction. Vascular/Lymphatic: There is abdominal aortic atherosclerosis without aneurysm. There is no gastrohepatic or hepatoduodenal ligament lymphadenopathy. No intraperitoneal or retroperitoneal lymphadenopathy. No pelvic sidewall lymphadenopathy. Reproductive: The prostate gland and seminal vesicles have normal imaging features. Other: No intraperitoneal free fluid.  Musculoskeletal: Status post left total hip replacement. Bone windows reveal no worrisome lytic or sclerotic osseous lesions. IMPRESSION: 1. 2.5 cm pancreatic body lesion obstructs the main pancreatic duct in the tail the pancreas generating marked pancreatic tail atrophy. Imaging features highly suspicious for pancreatic neoplasm and adenocarcinoma a distinct concern. 2. Hypervascular liver lesions raise concern for metastatic disease given findings described immediately above. 3. Circumferential wall thickening in the distal rectum with a small rim enhancing focus just posterior to the anal verge. Perirectal abscess could have this appearance. 4. Cholecystostomy tube with irregular gallbladder wall thickening. Pericholecystic stranding is associated with thickening of the colonic wall in the region the hepatic flexure, likely secondary to the primary gallbladder process. 5.  Abdominal Aortic Atherosclerois (ICD10-170.0) Results Were called by me at the time of interpretation on 12/08/2016 at 4:22 pm to Dr. Margarita Mail , who verbally acknowledged these results. Electronically Signed   By: Misty Stanley M.D.   On: 12/08/2016 16:23   Ir Catheter Tube Change  Result Date: 12/09/2016 INDICATION: 71 year old with multiple medical problems including a cholecystostomy tube. Cholecystostomy tube was reportedly leaking. Examination of the cholecystostomy tube demonstrated that the tube had been cut and only a small portion of the tube was sticking out of the skin. Arrangements were  made for cholecystostomy tube replacement. EXAM: EXCHANGE OF CHOLECYSTOSTOMY TUBE WITH FLUOROSCOPY MEDICATIONS: None ANESTHESIA/SEDATION: None FLUOROSCOPY TIME:  42 seconds, 21.3 mGy COMPLICATIONS: None immediate. PROCEDURE: Informed written consent was obtained from the patient and wife after a thorough discussion of the procedural risks, benefits and alternatives. All questions were addressed. Maximal Sterile Barrier Technique was  utilized including caps, mask, sterile gowns, sterile gloves, sterile drape, hand hygiene and skin antiseptic. A timeout was performed prior to the initiation of the procedure. The remaining cholecystostomy tube and surrounding skin were prepped and draped in a sterile fashion. A Bentson wire was advanced through the tube and into the gallbladder with fluoroscopic guidance. The old tube was removed. A new 10.2 [place] multipurpose drain was easily advanced into the gallbladder with fluoroscopic guidance. Contrast injection confirmed placement in the gallbladder. Skin was anesthetized with 1% lidocaine. Catheter was sutured to the skin. FINDINGS: The old cholecystostomy tube was cut and only a small portion was sticking out of the skin. New tube was positioned in the gallbladder. There is filling of the cystic duct but no filling of the common bile duct. The aspirated bile from the gallbladder is very cloudy and concern for purulent or infected material. Fluid was sent for culture. IMPRESSION: Successful exchange of the cholecystostomy tube with fluoroscopy. Bile was sent for culture. Electronically Signed   By: Markus Daft M.D.   On: 12/09/2016 09:30   Dg Cholangiogram  Existing Tube  Addendum Date: 11/25/2016   ADDENDUM REPORT: 11/25/2016 11:27 ADDENDUM: The cystic duct appears OCCLUDED approximately 2 cm from the neck of the gallbladder. There is no contrast into the CBD or small bowel. Findings were reviewed with Dr. Hulen Skains. Electronically Signed   By: Lucrezia Europe M.D.   On: 11/25/2016 11:27   Result Date: 11/25/2016 INDICATION: Cholecystostomy tube check EXAM: CHOLECYSTOSTOMY TUBE CHECK MEDICATIONS: 18 cc Omnipaque 300 ANESTHESIA/SEDATION: None FLUOROSCOPY TIME:  Fluoroscopy Time: minutes 36 seconds (124 mGy mGy). COMPLICATIONS: None immediate. PROCEDURE: Informed written consent was obtained from the patient after a thorough discussion of the procedural risks, benefits and alternatives. All questions were  addressed. Maximal Sterile Barrier Technique was utilized including caps, mask, sterile gowns, sterile gloves, sterile drape, hand hygiene and skin antiseptic. A timeout was performed prior to the initiation of the procedure. Contrast was injected into the cholecystostomy tube and images were obtained. FINDINGS: The cystic duct remains patent. He remains coiled in the gallbladder lumen. IMPRESSION: The cystic duct remains patent. The cholecystostomy tube remains in place. Follow-up in 6-8 weeks for repeat injection. Electronically Signed: By: Marybelle Killings M.D. On: 11/20/2016 12:58    Microbiology: Recent Results (from the past 240 hour(s))  Body fluid culture     Status: Abnormal   Collection Time: 12/08/16  6:24 PM  Result Value Ref Range Status   Specimen Description BILE  Final   Special Requests NONE  Final   Gram Stain   Final    ABUNDANT WBC PRESENT,BOTH PMN AND MONONUCLEAR MODERATE GRAM NEGATIVE RODS    Culture (A)  Final    ESCHERICHIA COLI Confirmed Extended Spectrum Beta-Lactamase Producer (ESBL) Performed at [facility], [place] [address] [place], [place] [address]    Report Status 12/12/2016 FINAL  Final   Organism ID, Bacteria ESCHERICHIA COLI  Final      Susceptibility   Escherichia coli - MIC*    AMPICILLIN >=32 RESISTANT Resistant     CEFAZOLIN >=64 RESISTANT Resistant     CEFEPIME RESISTANT Resistant  CEFTAZIDIME RESISTANT Resistant     CEFTRIAXONE >=64 RESISTANT Resistant     CIPROFLOXACIN >=4 RESISTANT Resistant     GENTAMICIN >=16 RESISTANT Resistant     IMIPENEM <=0.25 SENSITIVE Sensitive     TRIMETH/SULFA <=20 SENSITIVE Sensitive     AMPICILLIN/SULBACTAM 16 INTERMEDIATE Intermediate     PIP/TAZO <=4 SENSITIVE Sensitive     Extended ESBL POSITIVE Resistant     * ESCHERICHIA COLI  MRSA PCR Screening     Status: Abnormal   Collection Time: 12/08/16  6:51 PM  Result Value Ref Range Status   MRSA by PCR POSITIVE (A) NEGATIVE Final    Comment:         The GeneXpert MRSA Assay (FDA approved for NASAL specimens only), is one component of a comprehensive MRSA colonization surveillance program. It is not intended to diagnose MRSA infection nor to guide or monitor treatment for MRSA infections. CRITICAL RESULT CALLED TO, READ BACK BY AND VERIFIED WITH: B CHERRY RN [id number] 12/09/16 A NAVARRO   C difficile quick scan w PCR reflex     Status: Abnormal   Collection Time: 12/09/16  7:47 PM  Result Value Ref Range Status   C Diff antigen POSITIVE (A) NEGATIVE Final   C Diff toxin POSITIVE (A) NEGATIVE Final    Comment: CRITICAL RESULT CALLED TO, READ BACK BY AND VERIFIED WITH: B.CHERRY AT 2224 12/09/16 BY N.THOMPSON    C Diff interpretation Toxin producing C. difficile detected.  Final    Comment: CRITICAL RESULT CALLED TO, READ BACK BY AND VERIFIED WITH: B.CHERRY AT 2224 12/09/16 BY N.THOMPSON      Labs: Basic Metabolic Panel:  Recent Labs Lab 12/11/16 0414 12/12/16 0403 12/12/16 1306 12/14/16 0415 12/16/16 0407  NA 138 138 137 138 139  K 3.5 3.6 4.1 4.0 4.3  CL 107 107 107 108 109  CO2 23 21* 23 23 25   GLUCOSE 211* 154* 217* 187* 250*  BUN 16 14 13 11 10   CREATININE 1.33* 1.27* 1.10 1.03 0.95  CALCIUM 8.2* 8.2* 8.2* 8.3* 8.4*   Liver Function Tests: No results for input(s): AST, ALT, ALKPHOS, BILITOT, PROT, ALBUMIN in the last 168 hours. No results for input(s): LIPASE, AMYLASE in the last 168 hours. No results for input(s): AMMONIA in the last 168 hours. CBC:  Recent Labs Lab 12/12/16 0403 12/13/16 0435 12/14/16 0415 12/15/16 0424 12/16/16 0407  WBC 5.2 4.6 4.6 3.8* 4.2  HGB 9.3* 9.2* 9.0* 9.4* 9.2*  HCT 28.4* 28.6* 27.7* 28.9* 28.6*  MCV 72.3* 72.4* 72.1* 72.6* 73.1*  PLT 264 291 295 295 288   Cardiac Enzymes: No results for input(s): CKTOTAL, CKMB, CKMBINDEX, TROPONINI in the last 168 hours. BNP: BNP (last 3 results)  Recent Labs  12/28/15 0550 10/13/16 1401 11/10/16 1101  BNP 380.3* 1,039.0* 866.4*     ProBNP (last 3 results) No results for input(s): PROBNP in the last 8760 hours.  CBG:  Recent Labs Lab 12/15/16 0623 12/15/16 1420 12/15/16 1642 12/15/16 2200 12/16/16 0752  GLUCAP 257* 153* 244* 303* 230*       Signed:  Thomes Burak  [facility] 12/16/2016, 11:39 AM

## 2016-12-18 ENCOUNTER — Encounter: Payer: Self-pay | Admitting: Cardiology

## 2016-12-19 LAB — PROTIME-INR: INR: 1.1 (ref 0.9–1.1)

## 2016-12-22 ENCOUNTER — Ambulatory Visit (INDEPENDENT_AMBULATORY_CARE_PROVIDER_SITE_OTHER): Payer: Medicare Other | Admitting: Cardiovascular Disease

## 2016-12-22 DIAGNOSIS — Z5181 Encounter for therapeutic drug level monitoring: Secondary | ICD-10-CM

## 2016-12-22 DIAGNOSIS — I4891 Unspecified atrial fibrillation: Secondary | ICD-10-CM

## 2016-12-23 ENCOUNTER — Ambulatory Visit (INDEPENDENT_AMBULATORY_CARE_PROVIDER_SITE_OTHER): Payer: Medicare Other | Admitting: Cardiovascular Disease

## 2016-12-23 DIAGNOSIS — I4891 Unspecified atrial fibrillation: Secondary | ICD-10-CM

## 2016-12-23 DIAGNOSIS — C251 Malignant neoplasm of body of pancreas: Secondary | ICD-10-CM | POA: Insufficient documentation

## 2016-12-23 DIAGNOSIS — Z5181 Encounter for therapeutic drug level monitoring: Secondary | ICD-10-CM

## 2016-12-23 LAB — PROTIME-INR: INR: 1.4 — AB (ref 0.9–1.1)

## 2016-12-23 MED ORDER — ENOXAPARIN SODIUM 100 MG/ML ~~LOC~~ SOLN: 100.0000 mg | Freq: Two times a day (BID) | SUBCUTANEOUS | 0 refills | Status: DC

## 2016-12-23 NOTE — Assessment & Plan Note (Signed)
Metastatic Pancreatic Cancer: with liver mets S/P EUS biopsy I discussed the prognosis of pancreatic cancer

## 2016-12-24 ENCOUNTER — Ambulatory Visit (HOSPITAL_BASED_OUTPATIENT_CLINIC_OR_DEPARTMENT_OTHER): Payer: Medicare Other | Admitting: Hematology and Oncology

## 2016-12-24 ENCOUNTER — Encounter: Payer: Self-pay | Admitting: Hematology and Oncology

## 2016-12-24 DIAGNOSIS — C259 Malignant neoplasm of pancreas, unspecified: Secondary | ICD-10-CM | POA: Diagnosis not present

## 2016-12-24 DIAGNOSIS — C787 Secondary malignant neoplasm of liver and intrahepatic bile duct: Secondary | ICD-10-CM | POA: Diagnosis not present

## 2016-12-24 NOTE — Progress Notes (Signed)
Patient Care Team: Josetta Huddle, MD as PCP - General (Internal Medicine)  DIAGNOSIS:  Encounter Diagnosis  Name Primary?  . Pancreatic adenocarcinoma ([place])     CHIEF COMPLIANT: Follow-up of recent hospitalization for pancreatic mass  INTERVAL HISTORY: [name] [name] is a 71 year old with multiple comorbidities and bilateral below knee palpitations who was recently admitted to the hospital and was diagnosed with pancreatic mass with liver lesions. He underwent endoscopic ultrasound and biopsy which confirmed adenocarcinoma the pancreas. He is here today accompanied by his wife to discuss his treatment options. Previously during hospitalization, his wife did not want him to undergo any systemic chemotherapy. Today he is here to discuss the final pathology report and discuss if any treatment options be eligible to receive.  REVIEW OF SYSTEMS:   Constitutional: Denies fevers, chills or abnormal weight loss Eyes: Denies blurriness of vision Ears, nose, mouth, throat, and face: Denies mucositis or sore throat Respiratory: Denies cough, dyspnea or wheezes Cardiovascular: Denies palpitation, chest discomfort Gastrointestinal:  Denies nausea, heartburn or change in bowel habits Skin: Denies abnormal skin rashes Lymphatics: Denies new lymphadenopathy or easy bruising Neurological:Denies numbness, tingling or new weaknesses Behavioral/Psych: Mood is stable, no new changes  Extremities: Bilateral below-knee amputation  All other systems were reviewed with the patient and are negative.  I have reviewed the past medical history, past surgical history, social history and family history with the patient and they are unchanged from previous note.  ALLERGIES:  has No Known Allergies.  MEDICATIONS:  Current Outpatient Prescriptions  Medication Sig Dispense Refill  . acetaminophen (TYLENOL) 325 MG tablet Take 2 tablets (650 mg total) by mouth every 6 (six) hours as needed for mild pain (or  Fever >/= 101).    [name] alum & mag hydroxide-simeth (MAALOX/MYLANTA) 200-200-20 MG/5ML suspension Take 15 mLs by mouth every 4 (four) hours as needed for indigestion or heartburn. 355 mL 0  . atorvastatin (LIPITOR) 10 MG tablet Take 1 tablet (10 mg total) by mouth daily. 30 tablet 1  . carvedilol (COREG) 6.25 MG tablet Take 1 tablet (6.25 mg total) by mouth 2 (two) times daily with a meal. 60 tablet 0  . Cholecalciferol (VITAMIN D3) 10000 units TABS Take 1 tablet by mouth daily.    [name] enoxaparin (LOVENOX) 100 MG/ML injection Inject 1 mL (100 mg total) into the skin every 12 (twelve) hours. 10 Syringe 0  . furosemide (LASIX) 40 MG tablet Take 1 tablet (40 mg total) by mouth daily. 30 tablet 1  . insulin detemir (LEVEMIR) 100 UNIT/ML injection Inject 0.15 mLs (15 Units total) into the skin 2 (two) times daily. 10 mL 0  . iron polysaccharides (NIFEREX) 150 MG capsule Take 1 capsule (150 mg total) by mouth daily. 30 capsule 1  . Multiple Vitamin (MULTIVITAMIN WITH MINERALS) TABS tablet Take 1 tablet by mouth daily.    [name] omeprazole (PRILOSEC) 20 MG capsule Take 20 mg by mouth daily.    . pregabalin (LYRICA) 75 MG capsule Take 1 capsule (75 mg total) by mouth 2 (two) times daily. 60 capsule 1  . protein supplement shake (PREMIER PROTEIN) LIQD Take 325 mLs (11 oz total) by mouth 2 (two) times daily between meals. 60 Can 0  . saccharomyces boulardii (FLORASTOR) 250 MG capsule Take 1 capsule (250 mg total) by mouth 2 (two) times daily. 14 capsule 0  . sacubitril-valsartan (ENTRESTO) 24-26 MG Take 1 tablet by mouth 2 (two) times daily. 60 tablet 6  . spironolactone (ALDACTONE) 25 MG tablet Take  1 tablet (25 mg total) by mouth daily. 30 tablet 3  . vancomycin (VANCOCIN) 50 mg/mL oral solution Take 2.5 mLs (125 mg total) by mouth 4 (four) times daily. 70 mL 0  . warfarin (COUMADIN) 10 MG tablet Take 1 tablet (10 mg total) by mouth daily. 30 tablet 0   No current facility-administered medications for this visit.       PHYSICAL EXAMINATION: ECOG PERFORMANCE STATUS: 1 - Symptomatic but completely ambulatory  Vitals:   12/24/16 1112  BP: (!) 119/59  Pulse: 96  Resp: 18  Temp: 97.8 F (36.6 C)   Filed Weights   12/24/16 1112  Weight: 209 lb (94.8 kg)    GENERAL:alert, no distress and comfortable SKIN: skin color, texture, turgor are normal, no rashes or significant lesions EYES: normal, Conjunctiva are pink and non-injected, sclera clear OROPHARYNX:no exudate, no erythema and lips, buccal mucosa, and tongue normal  NECK: supple, thyroid normal size, non-tender, without nodularity LYMPH:  no palpable lymphadenopathy in the cervical, axillary or inguinal LUNGS: clear to auscultation and percussion with normal breathing effort HEART: regular rate & rhythm and no murmurs and no lower extremity edema ABDOMEN:abdomen soft, non-tender and normal bowel sounds MUSCULOSKELETAL:no cyanosis of digits and no clubbing  NEURO: alert & oriented x 3 with fluent speech, no focal motor/sensory deficits EXTREMITIES: Bilateral below-knee amputation  LABORATORY DATA:  I have reviewed the data as listed   Chemistry      Component Value Date/Time   NA 139 12/16/2016 0407   NA 144 10/07/2016 1031   K 4.3 12/16/2016 0407   CL 109 12/16/2016 0407   CO2 25 12/16/2016 0407   BUN 10 12/16/2016 0407   BUN 32 (H) 10/07/2016 1031   CREATININE 0.95 12/16/2016 0407   CREATININE 1.36 (H) 11/14/2015 1024      Component Value Date/Time   CALCIUM 8.4 (L) 12/16/2016 0407   ALKPHOS 136 (H) 12/09/2016 0351   AST 10 (L) 12/09/2016 0351   ALT 14 (L) 12/09/2016 0351   BILITOT 0.8 12/09/2016 0351       Lab Results  Component Value Date   WBC 4.2 12/16/2016   HGB 9.2 (L) 12/16/2016   HCT 28.6 (L) 12/16/2016   MCV 73.1 (L) 12/16/2016   PLT 288 12/16/2016   NEUTROABS 7.3 12/08/2016    ASSESSMENT & PLAN:  Pancreatic adenocarcinoma (HCC) Metastatic Pancreatic Cancer: with liver mets S/P EUS biopsy I discussed  the prognosis of pancreatic cancer I discussed the treatment options were primary cancer tend to be with systemic chemotherapy. Gemcitabine and Abraxane or even gemcitabine alone may be treatment options with palliative intent. Because of patient's multiple comorbidities including cardiomyopathy, diabetes, chronic kidney disease etc., I'm not certain that he would be able to tolerate the treatment. I would like to refer him for a second opinion to Dr. Learta Codding. I sent a message to Dr.Sherill to see the patient for second opinion  Otherwise my recommendation is hospice care   I spent 25 minutes talking to the patient of which more than half was spent in counseling and coordination of care.  No orders of the defined types were placed in this encounter.  The patient has a good understanding of the overall plan. he agrees with it. he will call with any problems that may develop before the next visit here.   Rulon Eisenmenger, MD 12/24/16

## 2016-12-26 ENCOUNTER — Telehealth: Payer: Self-pay | Admitting: Oncology

## 2016-12-26 ENCOUNTER — Ambulatory Visit (INDEPENDENT_AMBULATORY_CARE_PROVIDER_SITE_OTHER): Payer: Medicare Other | Admitting: Internal Medicine

## 2016-12-26 DIAGNOSIS — Z5181 Encounter for therapeutic drug level monitoring: Secondary | ICD-10-CM

## 2016-12-26 DIAGNOSIS — I4891 Unspecified atrial fibrillation: Secondary | ICD-10-CM

## 2016-12-26 LAB — PROTIME-INR: INR: 3.1 — AB (ref 0.9–1.1)

## 2016-12-26 NOTE — Telephone Encounter (Signed)
Spoke with wife re 5/7 appointment.

## 2016-12-29 ENCOUNTER — Telehealth: Payer: Self-pay | Admitting: Oncology

## 2016-12-29 ENCOUNTER — Ambulatory Visit (HOSPITAL_BASED_OUTPATIENT_CLINIC_OR_DEPARTMENT_OTHER): Payer: Medicare Other | Admitting: Oncology

## 2016-12-29 VITALS — BP 117/57 | HR 96 | Temp 98.8°F | Resp 17 | Ht 69.0 in | Wt 196.7 lb

## 2016-12-29 DIAGNOSIS — D509 Iron deficiency anemia, unspecified: Secondary | ICD-10-CM

## 2016-12-29 DIAGNOSIS — K6289 Other specified diseases of anus and rectum: Secondary | ICD-10-CM | POA: Diagnosis not present

## 2016-12-29 DIAGNOSIS — K769 Liver disease, unspecified: Secondary | ICD-10-CM | POA: Diagnosis not present

## 2016-12-29 DIAGNOSIS — E119 Type 2 diabetes mellitus without complications: Secondary | ICD-10-CM | POA: Diagnosis not present

## 2016-12-29 DIAGNOSIS — C259 Malignant neoplasm of pancreas, unspecified: Secondary | ICD-10-CM

## 2016-12-29 DIAGNOSIS — C251 Malignant neoplasm of body of pancreas: Secondary | ICD-10-CM

## 2016-12-29 DIAGNOSIS — N189 Chronic kidney disease, unspecified: Secondary | ICD-10-CM

## 2016-12-29 DIAGNOSIS — Z89511 Acquired absence of right leg below knee: Secondary | ICD-10-CM

## 2016-12-29 DIAGNOSIS — I739 Peripheral vascular disease, unspecified: Secondary | ICD-10-CM

## 2016-12-29 DIAGNOSIS — Z89512 Acquired absence of left leg below knee: Secondary | ICD-10-CM

## 2016-12-29 DIAGNOSIS — I509 Heart failure, unspecified: Secondary | ICD-10-CM

## 2016-12-29 NOTE — Telephone Encounter (Signed)
Appointments scheduled per 5.7.18 LOS. Patient given AVS report and calendars with future scheduled appointments. °

## 2016-12-29 NOTE — Progress Notes (Signed)
[facility] Patient Consult   Referring MD: Josetta Huddle, Md [address] [place], [place] [address]   [name] [name] 71 y.o.  11-18-1945    Reason for Referral: Pancreas cancer   HPI: [name] [name] was admitted in February with acute cholecystitis. He was treated with antibiotics and a cholecystostomy tube was placed. The tube remains in place. He was readmitted 12/08/2016. Anorexia, weight loss, and malfunction of the cholecystostomy drain. A CT of the abdomen/pelvis on 12/08/2016 revealed a 13 mm subcapsular lesion in the left liver, progressive from a CT 10/17/2016. Another hypervascular lesion was noted in the hepatic dome. A 2.1 x 2.5 cm mass was noted in the body of the pancreas with a dilated pancreas ducts. A 1.67 m rim-enhancing structure was noted just posterior to the anorectal junction. No adenopathy.  Dr. Paulita Fujita was consulted and he was taken to an upper endoscopic ultrasound on 12/15/2016. No liver lesion was identified. A mass was identified in the pancreas body, T3 N0, and a biopsy was performed. The cytology ([id number]) confirmed adenocarcinoma.  He was discharged from the hospital 12/16/2016. He completed a course of antibiotics for C. difficile colitis. He saw Dr. Lindi Adie on 12/24/2016. He recommends hospice care. [name] [name] is referred for a second opinion.   Past Medical History:  Diagnosis Date  . Anemia-Macrocytic     takes Iron pill daily  . Arthritis   . Automatic implantable cardioverter-defibrillator in situ    a. 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  . CAD (coronary artery disease)   . Cardiomyopathy, nonischemic ([place])    a. Cath 2003: mild nonobstructive CAD, EF 25% at that time.  . Chronic systolic CHF (congestive heart failure) (HCC)    takes Furosemide daily as well as Aldactone  . CKD (chronic kidney disease), stage III   . Complication of anesthesia   . Constipation    takes  Sennokot daily  . Dementia   . Gangrenous toe, Lt toe 09/21/2013  . GERD (gastroesophageal reflux disease)    takes Nexium daily  . Headache    occasionally  . High cholesterol    takes Atorvastatin daily  . History of blood transfusion    no abnormal reaction noted  . History of bronchitis    > 1 yr ago  . History of kidney stones   . Hypertension    takes Coreg daily  . LBBB (left bundle branch block)    takes Coumadin daily  . NSVT (nonsustained ventricular tachycardia) ([place])    a. Noted on ICD interrogation in 2011.  [name] PAD (peripheral artery disease) ([place])    a. 08/2013 Periph Angio/PTA: Abd Ao nl, RLE- 3v runoff, PT diff dzs, AT 90p, LLE 2v runoff, PT 100, AT 1m (diamondback ORA/chocolate balloon PTA).  [name] PAF (paroxysmal atrial fibrillation) ([place])    a. Noted on ICD interrogation 2012;  b. coumadin d/c'd 01/2013.  [name] PAF (paroxysmal atrial fibrillation) ([place])   . Peripheral neuropathy    hands;numbness and tingling   . Pneumonia    hx of > 47yr ago  . PONV (postoperative nausea and vomiting)   . Presence of permanent cardiac pacemaker   . Type II diabetes mellitus ([place])    takes Levemir daily as well as Novolog  . Urinary frequency   . Urinary urgency     .   Pancreas cancer-12/15/2016  Past Surgical History:  Procedure Laterality Date  . ABDOMINAL ANGIOGRAM  09/12/2013  Procedure: ABDOMINAL ANGIOGRAM;  Surgeon: Lorretta Harp, MD;  Location: [facility] CATH LAB;  Service: Cardiovascular;;  . AMPUTATION Left 10/04/2013   Procedure: LEFT GREAT TOE AND SECOND TOE AMPUTATION;  Surgeon: Marianna Payment, MD;  Location: [facility];  Service: Orthopedics;  Laterality: Left;  . AMPUTATION Left 11/09/2013   Procedure: LEFT AMPUTATION BELOW KNEE;  Surgeon: Marianna Payment, MD;  Location: [facility];  Service: Orthopedics;  Laterality: Left;  . AMPUTATION Right 01/04/2014   Procedure: Doristine Devoid second and third toe amputation;  Surgeon: Marianna Payment, MD;  Location: [facility];  Service:  Orthopedics;  Laterality: Right;  . AMPUTATION Right 01/30/2014   Procedure: RIGHT BELOW KNEE AMPUTATION;  Surgeon: Marianna Payment, MD;  Location: [facility];  Service: Orthopedics;  Laterality: Right;  . ATHERECTOMY Right 12/29/2013   Procedure: ATHERECTOMY;  Surgeon: Lorretta Harp, MD;  Location[facility] CATH LAB;  Service: Cardiovascular;  Laterality: Right;  Anterior Tibial Artery  . CARDIAC CATHETERIZATION  10/01/2001   THERE WAS GLOBAL HYPOKINESIS AND EF 25%. THERE APPEARED TO BE GLOBAL DECREASE IN WALL MOTION  . CARDIAC DEFIBRILLATOR PLACEMENT  06/2009   WITH GENERATOR REPLACED; BiV ICD  . CARDIOVASCULAR STRESS TEST  03/20/2009   EF 33%  . [place]  . COLONOSCOPY    . ESOPHAGOGASTRODUODENOSCOPY    . EUS Left 12/15/2016   Procedure: UPPER ENDOSCOPIC ULTRASOUND (EUS) LINEAR;  Surgeon: Arta Silence, MD;  Location: [facility] ENDOSCOPY;  Service: Endoscopy;  Laterality: Left;  . HIP ARTHROPLASTY Left 12/28/2015   Procedure: POSTERIOR  APPROACH HEMI HIP ARTHROPLASTY;  Surgeon: Leandrew Koyanagi, MD;  Location: [facility];  Service: Orthopedics;  Laterality: Left;  . IR CATHETER TUBE CHANGE  12/08/2016  . IR GENERIC HISTORICAL  10/18/2016   IR PERC CHOLECYSTOSTOMY 10/18/2016 [name] Keen, MD [facility]  . LEG AMPUTATION BELOW KNEE Left 11/09/2013   DR Erlinda Hong  . LITHOTRIPSY  2001  . LOWER EXTREMITY ANGIOGRAM Bilateral 09/12/2013   Procedure: LOWER EXTREMITY ANGIOGRAM;  Surgeon: Lorretta Harp, MD;  Location: [facility] CATH LAB;  Service: Cardiovascular;  Laterality: Bilateral;  . LOWER EXTREMITY ANGIOGRAM N/A 12/29/2013   Procedure: LOWER EXTREMITY ANGIOGRAM;  Surgeon: Lorretta Harp, MD;  Location: [facility] CATH LAB;  Service: Cardiovascular;  Laterality: N/A;  . STUMP REVISION Left 01/03/2015   Procedure: LEFT BELOW KNEE AMPUTATION REVISION;  Surgeon: Leandrew Koyanagi, MD;  Location: [facility];  Service: Orthopedics;  Laterality: Left;  . TOE AMPUTATION  10/04/2013   LEFT GREAT TOE AND 4TH TOE   /   DR Erlinda Hong  .  TRANSLUMINAL ATHERECTOMY TIBIAL ARTERY Left 09/12/2013  . US ECHOCARDIOGRAPHY  03/21/2008   EF 30-35%    Medications: Reviewed  Allergies: No Known Allergies  Family history: Sister was diagnosed with ovarian cancer in her 72s. He had 9 siblings. One son. No other family history of cancer  Social History:   He lives with his wife in [place]. He is retired from a Radiation protection practitioner. He does not use cigarettes or alcohol. He has received blood transfusions. No risk factor for HIV or hepatitis.  ROS:   Positives include:Anorexia, 8 pound weight loss since placement of the cholecystostomy tube, discomfort at the right upper quadrant drain site, diarrhea  A complete ROS was otherwise negative.  Physical Exam:  Blood pressure (!) 117/57, pulse 96, temperature 98.8 F (37.1 C), temperature source Oral, resp. rate 17, height 5\' 9"  (1.753 m), weight 196 lb 11.2 oz (89.2 kg),  SpO2 100 %.  HEENT: Multiple missing teeth, oropharynx without visible mass, neck without mass  Lungs: Rhonchi at the posterior bases bilaterally, no respiratory distress  Cardiac: Irregular, 2/6 systolic murmur  Abdomen: Right upper quadrant biliary drain with a gauze dressing. No hepatosplenomegaly, no mass, nontender  GU: Testes without mass  Lymph nodes: No cervical, supraclavicular, axillary, or inguinal nodes  Neurologic: Alert and oriented, the motor exam appears intact in the upper extremities. He is able to ambulate with assistance.  Skin: No rash  Musculoskeletal: No spine tenderness, left upper chest defibrillator Rectal: No stool, no mass or tenderness  LAB:  CBC  Lab Results  Component Value Date   WBC 4.2 12/16/2016   HGB 9.2 (L) 12/16/2016   HCT 28.6 (L) 12/16/2016   MCV 73.1 (L) 12/16/2016   PLT 288 12/16/2016   NEUTROABS 7.3 12/08/2016        CMP     Component Value Date/Time   NA 139 12/16/2016 0407   NA 144 10/07/2016 1031   K 4.3 12/16/2016 0407   CL 109 12/16/2016 0407   CO2  25 12/16/2016 0407   GLUCOSE 250 (H) 12/16/2016 0407   BUN 10 12/16/2016 0407   BUN 32 (H) 10/07/2016 1031   CREATININE 0.95 12/16/2016 0407   CREATININE 1.36 (H) 11/14/2015 1024   CALCIUM 8.4 (L) 12/16/2016 0407   PROT 6.5 12/09/2016 0351   ALBUMIN 2.5 (L) 12/09/2016 0351   AST 10 (L) 12/09/2016 0351   ALT 14 (L) 12/09/2016 0351   ALKPHOS 136 (H) 12/09/2016 0351   BILITOT 0.8 12/09/2016 0351   GFRNONAA >60 12/16/2016 0407   GFRAA >60 12/16/2016 0407    CA 19-9 on 12/09/2016-141     Imaging: CT abdomen/pelvis 12/08/2016-images reviewed   Assessment/Plan:   1. Pancreas cancer, pancreas body mass on CT 12/08/2016  CT 12/08/2016 consistent with a pancreas body mass and liver lesions suspicious for metastases  EUS on 12/15/2016 confirmed a pancreas body mass, T3 N0, biopsy consistent with adenocarcinoma  2.   Acute cholecystitis February 2018, status post placement of a cholecystostomy tube  3.  Diabetes  4.  Congestive heart failure  5.  Peripheral vascular disease, status post bilateral BKA  6.  Atrial fibrillation  7.  Microcytic anemia  8.  C. difficile colitis April 2018  9.  Chronic renal failure  10. Anorectal hyperenhancing lesion noted on CT 12/08/2016    Disposition:   [name] [name] has been diagnosed with pancreas cancer. I discussed the diagnosis of pancreas cancer, the prognosis, and treatment options with [name] [name] and his wife. He has multiple comorbid conditions that preclude consideration of a pancreas resection. He also does not appear to be a resection candidate based on the apparent liver metastases and locally advanced tumor.  He understands no therapy will be curative. We discussed supportive care/hospice, palliative radiation if he is not proven to have liver metastases, and a trial of systemic therapy.  He has multiple comorbid conditions that will make it difficult for [name] [name] to complete treatment with chemotherapy or radiation. I  will present his case at the GI tumor conference 12/31/2016 to get a better sense of whether the liver lesions are metastases. We could schedule a liver biopsy, but he would need to be taken off of anticoagulation for this procedure.  [name] [name] indicated he would like to proceed with a trial of chemotherapy if he has metastatic disease. His performance status may not be adequate to receive chemotherapy.  He will return for an office visit and further discussion on 01/07/2017.  We will repeat a CBC and ferritin when he returns on 01/07/2017. He has chronic microcytic anemia, most likely iron deficiency. This could be related to anticoagulation therapy. His wife reports he underwent a colonoscopy approximately 8 years ago. No rectal mass was palpated on exam today.  50 minutes were spent with the patient today. The majority of the time was used for counseling and coordination of care.  Betsy Coder, MD  12/29/2016, 2:00 PM

## 2016-12-31 ENCOUNTER — Ambulatory Visit
Admission: RE | Admit: 2016-12-31 | Discharge: 2016-12-31 | Disposition: A | Discharge status: Home / Self Care | Payer: Medicare Other | Source: Ambulatory Visit | Attending: General Surgery | Admitting: General Surgery

## 2016-12-31 ENCOUNTER — Ambulatory Visit (INDEPENDENT_AMBULATORY_CARE_PROVIDER_SITE_OTHER): Payer: Medicare Other | Admitting: Cardiology

## 2016-12-31 DIAGNOSIS — I4891 Unspecified atrial fibrillation: Secondary | ICD-10-CM

## 2016-12-31 DIAGNOSIS — Z5181 Encounter for therapeutic drug level monitoring: Secondary | ICD-10-CM

## 2016-12-31 DIAGNOSIS — K810 Acute cholecystitis: Secondary | ICD-10-CM

## 2016-12-31 HISTORY — PX: IR RADIOLOGIST EVAL & MGMT: IMG5224

## 2016-12-31 LAB — PROTIME-INR: INR: 1.6 — AB (ref 0.9–1.1)

## 2016-12-31 NOTE — Progress Notes (Signed)
Chief Complaint: Drain check  Referring Physician(s): [place]  Supervising Physician: Corrie Mckusick  History of Present Illness: [name] [name] is a 71 y.o. male w/ PMH NICM s/pICD, CHFwith EF to 20-25%, DM-2, CKD, PAF on coumadin, h/o bilateral BKA, who was initially admitted to [facility] 10/13/16 with CHF exacerbation.   He had 2 weeks of increased chest pain and shortness of breath. He was also complaining of abdominal pain and diarrhea at the time.   CT scan of his abdomen showed an umbilical hernia containing only fat and a distended GB on CT and [place] confirmed this.  He underwent percutaneous cholecystostomy. On 10/18/2016 by Dr. Annamaria Boots.  It was exchanged by Dr. Anselm Pancoast on 12/08/2016 because of leaking/broken.  He is here today for his scheduled drain follow up and injection.  He has known pancreatic cancer with possible liver mets.   Dr. Gearldine Shown note was reviewed and the patient is being presented at tumor board today.  The patient is scheduled to follow up on May 16th with Oncology and discuss the best plan.  There has been discussion of Hospice and also consideration for palliative chemotherapy however his functional status is low and may not tolerate it well.  His wife is with him today. She states she is flushing it twice daily.  There is still copious foul thick tan output from the drain. There is about 30-40 mL per day.  She also reported she thought it was leaking again when she flushed it.  It did not leak at all when I flushed it today and there was nothing on the gauze.  He has no abdominal pain. No fever/chills. He is still taking antibiotics.   Past Medical History:  Diagnosis Date  . Anemia    takes Iron pill daily  . Arthritis   . Automatic implantable cardioverter-defibrillator in situ    a. 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  . CAD (coronary artery disease)   . Cardiomyopathy, nonischemic ([place])    a.  Cath 2003: mild nonobstructive CAD, EF 25% at that time.  . Chronic systolic CHF (congestive heart failure) (HCC)    takes Furosemide daily as well as Aldactone  . CKD (chronic kidney disease), stage III   . Complication of anesthesia   . Constipation    takes Sennokot daily  . Dementia   . Gangrenous toe, Lt toe 09/21/2013  . GERD (gastroesophageal reflux disease)    takes Nexium daily  . Headache    occasionally  . High cholesterol    takes Atorvastatin daily  . History of blood transfusion    no abnormal reaction noted  . History of bronchitis    > 1 yr ago  . History of kidney stones   . Hypertension    takes Coreg daily  . LBBB (left bundle branch block)    takes Coumadin daily  . NSVT (nonsustained ventricular tachycardia) ([place])    a. Noted on ICD interrogation in 2011.  [name] PAD (peripheral artery disease) ([place])    a. 08/2013 Periph Angio/PTA: Abd Ao nl, RLE- 3v runoff, PT diff dzs, AT 90p, LLE 2v runoff, PT 100, AT 28m (diamondback ORA/chocolate balloon PTA).  [name] PAF (paroxysmal atrial fibrillation) ([place])    a. Noted on ICD interrogation 2012;  b. coumadin d/c'd 01/2013.  [name] PAF (paroxysmal atrial fibrillation) ([place])   . Peripheral neuropathy    hands;numbness and tingling   . Pneumonia    hx of > 61yr ago  .  PONV (postoperative nausea and vomiting)   . Presence of permanent cardiac pacemaker   . Type II diabetes mellitus ([place])    takes Levemir daily as well as Novolog  . Urinary frequency   . Urinary urgency     Past Surgical History:  Procedure Laterality Date  . ABDOMINAL ANGIOGRAM  09/12/2013   Procedure: ABDOMINAL ANGIOGRAM;  Surgeon: Lorretta Harp, MD;  Location: [facility] CATH LAB;  Service: Cardiovascular;;  . AMPUTATION Left 10/04/2013   Procedure: LEFT GREAT TOE AND SECOND TOE AMPUTATION;  Surgeon: Marianna Payment, MD;  Location: [place];  Service: Orthopedics;  Laterality: Left;  . AMPUTATION Left 11/09/2013   Procedure: LEFT AMPUTATION BELOW KNEE;  Surgeon:  Marianna Payment, MD;  Location: [place];  Service: Orthopedics;  Laterality: Left;  . AMPUTATION Right 01/04/2014   Procedure: Doristine Devoid second and third toe amputation;  Surgeon: Marianna Payment, MD;  Location: [place];  Service: Orthopedics;  Laterality: Right;  . AMPUTATION Right 01/30/2014   Procedure: RIGHT BELOW KNEE AMPUTATION;  Surgeon: Marianna Payment, MD;  Location: [place];  Service: Orthopedics;  Laterality: Right;  . ATHERECTOMY Right 12/29/2013   Procedure: ATHERECTOMY;  Surgeon: Lorretta Harp, MD;  Location: [facility] CATH LAB;  Service: Cardiovascular;  Laterality: Right;  Anterior Tibial Artery  . CARDIAC CATHETERIZATION  10/01/2001   THERE WAS GLOBAL HYPOKINESIS AND EF 25%. THERE APPEARED TO BE GLOBAL DECREASE IN WALL MOTION  . CARDIAC DEFIBRILLATOR PLACEMENT  06/2009   WITH GENERATOR REPLACED; BiV ICD  . CARDIOVASCULAR STRESS TEST  03/20/2009   EF 33%  . [place]  . COLONOSCOPY    . ESOPHAGOGASTRODUODENOSCOPY    . EUS Left 12/15/2016   Procedure: UPPER ENDOSCOPIC ULTRASOUND (EUS) LINEAR;  Surgeon: Arta Silence, MD;  Location: [facility];  Service: Endoscopy;  Laterality: Left;  . HIP ARTHROPLASTY Left 12/28/2015   Procedure: POSTERIOR  APPROACH HEMI HIP ARTHROPLASTY;  Surgeon: Leandrew Koyanagi, MD;  Location: [place];  Service: Orthopedics;  Laterality: Left;  . IR CATHETER TUBE CHANGE  12/08/2016  . IR GENERIC HISTORICAL  10/18/2016   IR PERC CHOLECYSTOSTOMY 10/18/2016 [name], MD [facility]-INTERV RAD  . LEG AMPUTATION BELOW KNEE Left 11/09/2013   DR Erlinda Hong  . LITHOTRIPSY  2001  . LOWER EXTREMITY ANGIOGRAM Bilateral 09/12/2013   Procedure: LOWER EXTREMITY ANGIOGRAM;  Surgeon: Lorretta Harp, MD;  Location: [facility] CATH LAB;  Service: Cardiovascular;  Laterality: Bilateral;  . LOWER EXTREMITY ANGIOGRAM N/A 12/29/2013   Procedure: LOWER EXTREMITY ANGIOGRAM;  Surgeon: Lorretta Harp, MD;  Location: [facility] CATH LAB;  Service: Cardiovascular;  Laterality: N/A;  . STUMP REVISION Left  01/03/2015   Procedure: LEFT BELOW KNEE AMPUTATION REVISION;  Surgeon: Leandrew Koyanagi, MD;  Location: [place];  Service: Orthopedics;  Laterality: Left;  . TOE AMPUTATION  10/04/2013   LEFT GREAT TOE AND 4TH TOE   /   DR Erlinda Hong  . TRANSLUMINAL ATHERECTOMY TIBIAL ARTERY Left 09/12/2013  . US ECHOCARDIOGRAPHY  03/21/2008   EF 30-35%    Allergies: Patient has no known allergies.  Medications: Prior to Admission medications   Medication Sig Start Date End Date Taking? Authorizing Provider  acetaminophen (TYLENOL) 325 MG tablet Take 2 tablets (650 mg total) by mouth every 6 (six) hours as needed for mild pain (or Fever >/= 101). 05/03/16   Mikhail, Velta Addison, DO  alum & mag hydroxide-simeth (MAALOX/MYLANTA) 200-200-20 MG/5ML suspension Take 15 mLs by mouth every 4 (four) hours as  needed for indigestion or heartburn. 12/16/16   Mikhail, Velta Addison, DO  atorvastatin (LIPITOR) 10 MG tablet Take 1 tablet (10 mg total) by mouth daily. 01/15/15   Angiulli, Lavon Paganini, PA-C  carvedilol (COREG) 6.25 MG tablet Take 1 tablet (6.25 mg total) by mouth 2 (two) times daily with a meal. 10/23/16   Velvet Bathe, MD  Cholecalciferol (VITAMIN D3) 10000 units TABS Take 1 tablet by mouth daily.    [provider]  enoxaparin (LOVENOX) 100 MG/ML injection Inject 1 mL (100 mg total) into the skin every 12 (twelve) hours. 12/23/16   Deboraha Sprang, MD  furosemide (LASIX) 40 MG tablet Take 1 tablet (40 mg total) by mouth daily. 01/02/16   Theodis Blaze, MD  insulin detemir (LEVEMIR) 100 UNIT/ML injection Inject 0.15 mLs (15 Units total) into the skin 2 (two) times daily. 10/23/16   Velvet Bathe, MD  Insulin Glargine (TOUJEO SOLOSTAR [place]) Inject 15 Units into the skin.    [provider]  iron polysaccharides (NIFEREX) 150 MG capsule Take 1 capsule (150 mg total) by mouth daily. 01/15/15   Angiulli, Lavon Paganini, PA-C  Multiple Vitamin (MULTIVITAMIN WITH MINERALS) TABS tablet Take 1 tablet by mouth daily.    [provider]    omeprazole (PRILOSEC) 20 MG capsule Take 20 mg by mouth daily.    [provider]  pregabalin (LYRICA) 75 MG capsule Take 1 capsule (75 mg total) by mouth 2 (two) times daily. 01/15/15   Angiulli, Lavon Paganini, PA-C  protein supplement shake (PREMIER PROTEIN) LIQD Take 325 mLs (11 oz total) by mouth 2 (two) times daily between meals. 12/16/16   Mikhail, Velta Addison, DO  saccharomyces boulardii (FLORASTOR) 250 MG capsule Take 1 capsule (250 mg total) by mouth 2 (two) times daily. 12/16/16   Mikhail, Velta Addison, DO  sacubitril-valsartan (ENTRESTO) 24-26 MG Take 1 tablet by mouth 2 (two) times daily. 10/31/15   Deboraha Sprang, MD  spironolactone (ALDACTONE) 25 MG tablet Take 1 tablet (25 mg total) by mouth daily. 11/10/16   Larey Dresser, MD  warfarin (COUMADIN) 10 MG tablet Take 1 tablet (10 mg total) by mouth daily. 12/16/16   Cristal Ford, DO     Family History  Problem Relation Age of Onset  . Heart disease Mother   . Hypertension Mother   . Diabetes Mother   . Diabetes Father     Social History   Social History  . Marital status: Married    Spouse name: N/A  . Number of children: N/A  . Years of education: N/A   Social History Main Topics  . Smoking status: Never Smoker  . Smokeless tobacco: Never Used  . Alcohol use No  . Drug use: No  . Sexual activity: Not Currently   Other Topics Concern  . Not on file   Social History Narrative  . No narrative on file    Review of Systems  Vital Signs: BP 116/66   Pulse 95   Temp 98.5 F (36.9 C)   SpO2 99%   Physical Exam Awake and alert NAD Abdomen with distension. Could be developing ascites. Drain in place. Looks good. No obvious leakage Flushes easily. Stat-lock is loose. We do not have any in stock that I can replace it with so I secured it to the skin with Hypafix tape.  Imaging: Dg Chest 2 View  Result Date: 12/08/2016 CLINICAL DATA:  Increasing generalized weakness since Friday, per wife pt also has gb stent  that broke  and is draining everywhere, no other chest complaints EXAM: CHEST  2 VIEW COMPARISON:  10/17/2016 FINDINGS: There is a linear band of airspace disease in the right middle lobe likely reflecting atelectasis. There is no pleural effusion or pneumothorax. The heart and mediastinal contours are unremarkable. There is a 3 lead cardiac pacemaker. The osseous structures are unremarkable. IMPRESSION: No active cardiopulmonary disease. Electronically Signed   By: Kathreen Devoid   On: 12/08/2016 11:57   Ct Abdomen Pelvis W Contrast  Result Date: 12/08/2016 CLINICAL DATA:  Generalized weakness. History of cholecystostomy tube. EXAM: CT ABDOMEN AND PELVIS WITH CONTRAST TECHNIQUE: Multidetector CT imaging of the abdomen and pelvis was performed using the standard protocol following bolus administration of intravenous contrast. CONTRAST:  129mL ISOVUE-300 IOPAMIDOL (ISOVUE-300) INJECTION 61% COMPARISON:  10/17/2016 FINDINGS: Lower chest:  Bibasilar dependent atelectasis. Hepatobiliary: 13 mm subcapsular lesion lateral segment left liver with hyper perfusion in the surrounding parenchyma. This appears progressed since prior study. 13 mm hypervascular lesion noted in the hepatic dome (image 15 series 2). The cholecystostomy tube visualized with loop formed in the region of the gallbladder neck. Gas in the gallbladder lumen is compatible with the presence of the drain. Insert no biliary Pancreas: 2.1 x 2.5 cm lesion identified in the body the pancreas with markedly atrophic pancreatic tail and dilated duct in the tail the pancreas that terminates at the level of the mass. Spleen: No splenomegaly. No focal mass lesion. Adrenals/Urinary Tract: No adrenal nodule or mass. Kidneys unremarkable. No evidence for hydroureter. Left bladder wall obscured by streak artifact from hip replacement. Stomach/Bowel: Stomach is nondistended. No gastric wall thickening. No evidence of outlet obstruction. Duodenum is normally positioned as  is the ligament of Treitz. No small bowel wall thickening. No small bowel dilatation. The terminal ileum is normal. The appendix is normal. Irregular wall thickening in the hepatic flexure likely secondary to the gallbladder process. Transverse and descending colon unremarkable. Diverticular changes noted sigmoid colon circumferential wall thickening is seen in the rectum and there is a 1.6 cm rim enhancing structure just posterior to the anal rectal junction. Vascular/Lymphatic: There is abdominal aortic atherosclerosis without aneurysm. There is no gastrohepatic or hepatoduodenal ligament lymphadenopathy. No intraperitoneal or retroperitoneal lymphadenopathy. No pelvic sidewall lymphadenopathy. Reproductive: The prostate gland and seminal vesicles have normal imaging features. Other: No intraperitoneal free fluid. Musculoskeletal: Status post left total hip replacement. Bone windows reveal no worrisome lytic or sclerotic osseous lesions. IMPRESSION: 1. 2.5 cm pancreatic body lesion obstructs the main pancreatic duct in the tail the pancreas generating marked pancreatic tail atrophy. Imaging features highly suspicious for pancreatic neoplasm and adenocarcinoma a distinct concern. 2. Hypervascular liver lesions raise concern for metastatic disease given findings described immediately above. 3. Circumferential wall thickening in the distal rectum with a small rim enhancing focus just posterior to the anal verge. Perirectal abscess could have this appearance. 4. Cholecystostomy tube with irregular gallbladder wall thickening. Pericholecystic stranding is associated with thickening of the colonic wall in the region the hepatic flexure, likely secondary to the primary gallbladder process. 5.  Abdominal Aortic Atherosclerois (ICD10-170.0) Results Were called by me at the time of interpretation on 12/08/2016 at 4:22 pm to Dr. Margarita Mail , who verbally acknowledged these results. Electronically Signed   By: Misty Stanley M.D.   On: 12/08/2016 16:23   Ir Catheter Tube Change  Result Date: 12/09/2016 INDICATION: 71 year old with multiple medical problems including a cholecystostomy tube. Cholecystostomy tube was reportedly leaking. Examination of the cholecystostomy tube demonstrated  that the tube had been cut and only a small portion of the tube was sticking out of the skin. Arrangements were made for cholecystostomy tube replacement. EXAM: EXCHANGE OF CHOLECYSTOSTOMY TUBE WITH FLUOROSCOPY MEDICATIONS: None ANESTHESIA/SEDATION: None FLUOROSCOPY TIME:  42 seconds, 97.0 mGy COMPLICATIONS: None immediate. PROCEDURE: Informed written consent was obtained from the patient and wife after a thorough discussion of the procedural risks, benefits and alternatives. All questions were addressed. Maximal Sterile Barrier Technique was utilized including caps, mask, sterile gowns, sterile gloves, sterile drape, hand hygiene and skin antiseptic. A timeout was performed prior to the initiation of the procedure. The remaining cholecystostomy tube and surrounding skin were prepped and draped in a sterile fashion. A Bentson wire was advanced through the tube and into the gallbladder with fluoroscopic guidance. The old tube was removed. A new 10.2 [place] multipurpose drain was easily advanced into the gallbladder with fluoroscopic guidance. Contrast injection confirmed placement in the gallbladder. Skin was anesthetized with 1% lidocaine. Catheter was sutured to the skin. FINDINGS: The old cholecystostomy tube was cut and only a small portion was sticking out of the skin. New tube was positioned in the gallbladder. There is filling of the cystic duct but no filling of the common bile duct. The aspirated bile from the gallbladder is very cloudy and concern for purulent or infected material. Fluid was sent for culture. IMPRESSION: Successful exchange of the cholecystostomy tube with fluoroscopy. Bile was sent for culture. Electronically Signed    By: Markus Daft M.D.   On: 12/09/2016 09:30    Labs:  CBC:  Recent Labs  12/13/16 0435 12/14/16 0415 12/15/16 0424 12/16/16 0407  WBC 4.6 4.6 3.8* 4.2  HGB 9.2* 9.0* 9.4* 9.2*  HCT 28.6* 27.7* 28.9* 28.6*  PLT 291 295 295 288    COAGS:  Recent Labs  12/16/16 1149 12/19/16 12/23/16 12/26/16  INR 1.09 1.1 1.4* 3.1*    BMP:  Recent Labs  12/12/16 0403 12/12/16 1306 12/14/16 0415 12/16/16 0407  NA 138 137 138 139  K 3.6 4.1 4.0 4.3  CL 107 107 108 109  CO2 21* 23 23 25   GLUCOSE 154* 217* 187* 250*  BUN 14 13 11 10   CALCIUM 8.2* 8.2* 8.3* 8.4*  CREATININE 1.27* 1.10 1.03 0.95  GFRNONAA 54* >60 >60 >60  GFRAA >60 >60 >60 >60    LIVER FUNCTION TESTS:  Recent Labs  10/21/16 0453 10/22/16 0357 12/08/16 1212 12/09/16 0351  BILITOT 0.9 0.5 1.0 0.8  AST 36 27 14* 10*  ALT 59 39 19 14*  ALKPHOS 93 90 191* 136*  PROT 6.0* 5.9* 7.6 6.5  ALBUMIN 1.9* 1.9* 2.9* 2.5*    TUMOR MARKERS:  Recent Labs  12/09/16 1810  CA199 141*    Assessment:  Cholecystitis = s/p drain placement on 10/18/2016 and exchange due to leaking on 12/08/2016.  Injection done today shows drain remains in good position. No visualization of the cystic duct.  Recommend leave the drain in place, especially given patients poor prognosis.  Will schedule routine drain exchange at the hospital in about 4 months.   Electronically Signed: Murrell Redden PA-C 12/31/2016, 11:30 AM   Please refer to Dr. Pasty Arch attestation of this note for management and plan.

## 2017-01-02 ENCOUNTER — Other Ambulatory Visit: Payer: Self-pay

## 2017-01-02 ENCOUNTER — Encounter (HOSPITAL_COMMUNITY): Payer: Self-pay | Admitting: Emergency Medicine

## 2017-01-02 ENCOUNTER — Emergency Department (HOSPITAL_COMMUNITY): Payer: Medicare Other

## 2017-01-02 ENCOUNTER — Inpatient Hospital Stay (HOSPITAL_COMMUNITY)
Admission: EM | Admit: 2017-01-02 | Discharge: 2017-01-08 | DRG: 291 | Disposition: A | Discharge status: Home Health | Payer: Medicare Other | Attending: Family Medicine | Admitting: Family Medicine

## 2017-01-02 DIAGNOSIS — I472 Ventricular tachycardia: Secondary | ICD-10-CM | POA: Diagnosis present

## 2017-01-02 DIAGNOSIS — Z79899 Other long term (current) drug therapy: Secondary | ICD-10-CM

## 2017-01-02 DIAGNOSIS — D638 Anemia in other chronic diseases classified elsewhere: Secondary | ICD-10-CM | POA: Diagnosis present

## 2017-01-02 DIAGNOSIS — Y92230 Patient room in hospital as the place of occurrence of the external cause: Secondary | ICD-10-CM | POA: Diagnosis not present

## 2017-01-02 DIAGNOSIS — D500 Iron deficiency anemia secondary to blood loss (chronic): Secondary | ICD-10-CM | POA: Diagnosis present

## 2017-01-02 DIAGNOSIS — T501X5A Adverse effect of loop [high-ceiling] diuretics, initial encounter: Secondary | ICD-10-CM | POA: Diagnosis not present

## 2017-01-02 DIAGNOSIS — C251 Malignant neoplasm of body of pancreas: Secondary | ICD-10-CM | POA: Diagnosis present

## 2017-01-02 DIAGNOSIS — E1122 Type 2 diabetes mellitus with diabetic chronic kidney disease: Secondary | ICD-10-CM | POA: Diagnosis present

## 2017-01-02 DIAGNOSIS — I428 Other cardiomyopathies: Secondary | ICD-10-CM

## 2017-01-02 DIAGNOSIS — N179 Acute kidney failure, unspecified: Secondary | ICD-10-CM | POA: Diagnosis not present

## 2017-01-02 DIAGNOSIS — N183 Chronic kidney disease, stage 3 (moderate): Secondary | ICD-10-CM | POA: Diagnosis present

## 2017-01-02 DIAGNOSIS — A0472 Enterocolitis due to Clostridium difficile, not specified as recurrent: Secondary | ICD-10-CM | POA: Diagnosis present

## 2017-01-02 DIAGNOSIS — E118 Type 2 diabetes mellitus with unspecified complications: Secondary | ICD-10-CM

## 2017-01-02 DIAGNOSIS — I10 Essential (primary) hypertension: Secondary | ICD-10-CM | POA: Diagnosis present

## 2017-01-02 DIAGNOSIS — Z87442 Personal history of urinary calculi: Secondary | ICD-10-CM

## 2017-01-02 DIAGNOSIS — C250 Malignant neoplasm of head of pancreas: Secondary | ICD-10-CM | POA: Diagnosis present

## 2017-01-02 DIAGNOSIS — E7800 Pure hypercholesterolemia, unspecified: Secondary | ICD-10-CM | POA: Diagnosis present

## 2017-01-02 DIAGNOSIS — I429 Cardiomyopathy, unspecified: Secondary | ICD-10-CM | POA: Diagnosis present

## 2017-01-02 DIAGNOSIS — I130 Hypertensive heart and chronic kidney disease with heart failure and stage 1 through stage 4 chronic kidney disease, or unspecified chronic kidney disease: Principal | ICD-10-CM | POA: Diagnosis present

## 2017-01-02 DIAGNOSIS — Z96642 Presence of left artificial hip joint: Secondary | ICD-10-CM | POA: Diagnosis present

## 2017-01-02 DIAGNOSIS — R0602 Shortness of breath: Secondary | ICD-10-CM

## 2017-01-02 DIAGNOSIS — Z8249 Family history of ischemic heart disease and other diseases of the circulatory system: Secondary | ICD-10-CM

## 2017-01-02 DIAGNOSIS — Z89512 Acquired absence of left leg below knee: Secondary | ICD-10-CM

## 2017-01-02 DIAGNOSIS — Z95810 Presence of automatic (implantable) cardiac defibrillator: Secondary | ICD-10-CM

## 2017-01-02 DIAGNOSIS — Z89511 Acquired absence of right leg below knee: Secondary | ICD-10-CM

## 2017-01-02 DIAGNOSIS — I959 Hypotension, unspecified: Secondary | ICD-10-CM | POA: Diagnosis present

## 2017-01-02 DIAGNOSIS — R509 Fever, unspecified: Secondary | ICD-10-CM | POA: Diagnosis present

## 2017-01-02 DIAGNOSIS — K769 Liver disease, unspecified: Secondary | ICD-10-CM | POA: Diagnosis present

## 2017-01-02 DIAGNOSIS — Z7901 Long term (current) use of anticoagulants: Secondary | ICD-10-CM

## 2017-01-02 DIAGNOSIS — I5023 Acute on chronic systolic (congestive) heart failure: Secondary | ICD-10-CM | POA: Diagnosis present

## 2017-01-02 DIAGNOSIS — R339 Retention of urine, unspecified: Secondary | ICD-10-CM | POA: Diagnosis present

## 2017-01-02 DIAGNOSIS — I2510 Atherosclerotic heart disease of native coronary artery without angina pectoris: Secondary | ICD-10-CM | POA: Diagnosis present

## 2017-01-02 DIAGNOSIS — E1165 Type 2 diabetes mellitus with hyperglycemia: Secondary | ICD-10-CM | POA: Diagnosis present

## 2017-01-02 DIAGNOSIS — E1151 Type 2 diabetes mellitus with diabetic peripheral angiopathy without gangrene: Secondary | ICD-10-CM | POA: Diagnosis present

## 2017-01-02 DIAGNOSIS — Z794 Long term (current) use of insulin: Secondary | ICD-10-CM

## 2017-01-02 DIAGNOSIS — E785 Hyperlipidemia, unspecified: Secondary | ICD-10-CM | POA: Diagnosis present

## 2017-01-02 DIAGNOSIS — I480 Paroxysmal atrial fibrillation: Secondary | ICD-10-CM | POA: Diagnosis present

## 2017-01-02 DIAGNOSIS — I509 Heart failure, unspecified: Secondary | ICD-10-CM

## 2017-01-02 DIAGNOSIS — Z833 Family history of diabetes mellitus: Secondary | ICD-10-CM

## 2017-01-02 DIAGNOSIS — R627 Adult failure to thrive: Secondary | ICD-10-CM | POA: Diagnosis present

## 2017-01-02 DIAGNOSIS — K219 Gastro-esophageal reflux disease without esophagitis: Secondary | ICD-10-CM | POA: Diagnosis present

## 2017-01-02 DIAGNOSIS — E11649 Type 2 diabetes mellitus with hypoglycemia without coma: Secondary | ICD-10-CM | POA: Diagnosis not present

## 2017-01-02 LAB — CBC WITH DIFFERENTIAL/PLATELET
BASOS PCT: 0 %
Basophils Absolute: 0 10*3/uL (ref 0.0–0.1)
EOS ABS: 0.1 10*3/uL (ref 0.0–0.7)
Eosinophils Relative: 1 %
HCT: 26.6 % — ABNORMAL LOW (ref 39.0–52.0)
HEMOGLOBIN: 9 g/dL — AB (ref 13.0–17.0)
Lymphocytes Relative: 6 %
Lymphs Abs: 0.5 10*3/uL — ABNORMAL LOW (ref 0.7–4.0)
MCH: 24.9 pg — ABNORMAL LOW (ref 26.0–34.0)
MCHC: 33.8 g/dL (ref 30.0–36.0)
MCV: 73.5 fL — ABNORMAL LOW (ref 78.0–100.0)
Monocytes Absolute: 0.6 10*3/uL (ref 0.1–1.0)
Monocytes Relative: 6 %
NEUTROS PCT: 88 %
Neutro Abs: 8.3 10*3/uL — ABNORMAL HIGH (ref 1.7–7.7)
Platelets: 340 10*3/uL (ref 150–400)
RBC: 3.62 MIL/uL — AB (ref 4.22–5.81)
RDW: 20.7 % — ABNORMAL HIGH (ref 11.5–15.5)
WBC: 9.4 10*3/uL (ref 4.0–10.5)

## 2017-01-02 LAB — PROTIME-INR
INR: 2.12
PROTHROMBIN TIME: 24.1 s — AB (ref 11.4–15.2)

## 2017-01-02 LAB — I-STAT TROPONIN, ED: TROPONIN I, POC: 0 ng/mL (ref 0.00–0.08)

## 2017-01-02 LAB — I-STAT CG4 LACTIC ACID, ED: LACTIC ACID, VENOUS: 1.1 mmol/L (ref 0.5–1.9)

## 2017-01-02 MED ORDER — WARFARIN SODIUM 10 MG PO TABS: ORAL_TABLET | ORAL | 2 refills | Status: DC

## 2017-01-02 MED ORDER — IPRATROPIUM-ALBUTEROL 0.5-2.5 (3) MG/3ML IN SOLN
3.0000 mL | Freq: Once | RESPIRATORY_TRACT | Status: AC
  Administered 2017-01-02: 3 mL via RESPIRATORY_TRACT
  Filled 2017-01-02: qty 3

## 2017-01-02 MED ORDER — WARFARIN SODIUM 10 MG PO TABS: ORAL_TABLET | ORAL | 1 refills | Status: DC

## 2017-01-02 NOTE — ED Provider Notes (Signed)
[facility] DEPT Provider Note   CSN: [id number] Arrival date & time: 01/02/17  1936     History   Chief Complaint Chief Complaint  Patient presents with  . Shortness of Breath    HPI [name] [name] is a 71 y.o. male.  The history is provided by the spouse, the patient and medical records.  Shortness of Breath  This is a recurrent problem. The problem occurs continuously.The current episode started 2 days ago. The problem has been gradually worsening. Associated symptoms include cough, sputum production, wheezing and leg swelling. Pertinent negatives include no fever, no headaches, no rhinorrhea, no chest pain, no syncope, no vomiting, no abdominal pain (chronic and unchange) and no rash. He has tried nothing for the symptoms. The treatment provided no relief. He has had prior hospitalizations. He has had prior ED visits. Associated medical issues include heart failure.    Past Medical History:  Diagnosis Date  . Anemia    takes Iron pill daily  . Arthritis   . Automatic implantable cardioverter-defibrillator in situ    a. 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  . CAD (coronary artery disease)   . Cardiomyopathy, nonischemic ([place])    a. Cath 2003: mild nonobstructive CAD, EF 25% at that time.  . Chronic systolic CHF (congestive heart failure) (HCC)    takes Furosemide daily as well as Aldactone  . CKD (chronic kidney disease), stage III   . Complication of anesthesia   . Constipation    takes Sennokot daily  . Dementia   . Gangrenous toe, Lt toe 09/21/2013  . GERD (gastroesophageal reflux disease)    takes Nexium daily  . Headache    occasionally  . High cholesterol    takes Atorvastatin daily  . History of blood transfusion    no abnormal reaction noted  . History of bronchitis    > 1 yr ago  . History of kidney stones   . Hypertension    takes Coreg daily  . LBBB (left bundle branch block)    takes Coumadin daily  . NSVT  (nonsustained ventricular tachycardia) ([place])    a. Noted on ICD interrogation in 2011.  [name] PAD (peripheral artery disease) ([place])    a. 08/2013 Periph Angio/PTA: Abd Ao nl, RLE- 3v runoff, PT diff dzs, AT 90p, LLE 2v runoff, PT 100, AT 36m (diamondback ORA/chocolate balloon PTA).  [name] PAF (paroxysmal atrial fibrillation) ([place])    a. Noted on ICD interrogation 2012;  b. coumadin d/c'd 01/2013.  [name] PAF (paroxysmal atrial fibrillation) ([place])   . Peripheral neuropathy    hands;numbness and tingling   . Pneumonia    hx of > 42yr ago  . PONV (postoperative nausea and vomiting)   . Presence of permanent cardiac pacemaker   . Type II diabetes mellitus ([place])    takes Levemir daily as well as Novolog  . Urinary frequency   . Urinary urgency     Patient Active Problem List   Diagnosis Date Noted  . Pancreatic adenocarcinoma ([place]) 12/23/2016  . Chronic anticoagulation (warfarin) 12/09/2016  . Pancreatic mass 12/08/2016  . Abdominal pain   . Cholecystitis s/p perc cholecystostomy tube 10/18/2016   . Stage 3 chronic kidney disease   . Hypernatremia   . Systolic CHF, acute on chronic ([place]) 10/13/2016  . Diarrhea 10/13/2016  . Acute on chronic systolic congestive heart failure ([place])   . HCAP (healthcare-associated pneumonia) 04/30/2016  . Weakness generalized 04/30/2016  . Status post hip hemiarthroplasty   .  Urinary retention   . Dementia   . AKI (acute kidney injury) ([place])   . Thrombocytopenia ([place])   . Anemia of chronic disease   . Closed left hip fracture ([place]) 12/28/2015  . Acute renal failure superimposed on stage 3 chronic kidney disease ([place]) 12/28/2015  . Fall   . Diabetes mellitus with neuropathy ([place]) 01/10/2015  . Atrial fibrillation [I48.91] 11/01/2014  . Encounter for therapeutic drug monitoring 11/01/2014  . S/P bilateral BKA (below knee amputation) ([place]) 02/02/2014  . Osteomyelitis of right foot ([place]) 01/30/2014  . 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 ([place]) 01/04/2014  .  Osteomyelitis of toe of right foot ([place]) 01/04/2014  . S/P Lt BKA 11/09/13 11/14/2013  . Acute blood loss anemia 11/11/2013  . Type II or unspecified type diabetes mellitus 11/09/2013  . Lower limb amputation, great toe ([place]) 10/11/2013  . Lower limb amputation, other toe(s) 10/11/2013  . Chronic osteomyelitis of toe of left foot ([place]) 10/04/2013  . Foot osteomyelitis, left ([place]) 10/04/2013  . Uncontrolled pain, Lt toe 09/21/2013  . PAD (peripheral artery disease) ([place]) 09/13/2013  . Hyperlipidemia 09/13/2013  . PAF (paroxysmal atrial fibrillation) ([place]) 09/13/2013  . Critical lower limb ischemia- s/p Rt anterior tibial PTA 12/29/13 in preparation for Rt BKA 08/30/2013  . Generalized weakness 02/12/2013  . 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 HTN (hypertension) 04/08/2011  . NICM- EF 20-25% echo 6/14 09/29/2008  . LBBB 09/29/2008  . SYSTOLIC HEART FAILURE, CHRONIC 09/29/2008    Past Surgical History:  Procedure Laterality Date  . ABDOMINAL ANGIOGRAM  09/12/2013   Procedure: ABDOMINAL ANGIOGRAM;  Surgeon: Lorretta Harp, MD;  Location: [facility] C[facility] LAB;  Service: Cardiovascular;;  . AMPUTATION Left 10/04/2013   Procedure: LEFT GREAT TOE AND SECOND TOE AMPUTATION;  Surgeon: Marianna Payment, MD;  Location: [place] [facility];  Service: Orthopedics;  Laterality: Left;  . AMPUTATION Left 11/09/2013   Procedure: LEFT AMPUTATION BELOW KNEE;  Surgeon: Marianna Payment, MD;  Location: [place];  Service: Orthopedics;  Laterality: Left;  . AMPUTATION Right 01/04/2014   Procedure: Doristine Devoid second and third toe amputation;  Surgeon: Marianna Payment, MD;  Location: [place];  Service: Orthopedics;  Laterality: Right;  . AMPUTATION Right 01/30/2014   Procedure: RIGHT BELOW KNEE AMPUTATION;  Surgeon: Marianna Payment, MD;  Location: [place];  Service: Orthopedics;  Laterality: Right;  . ATHERECTOMY Right 12/29/2013   Procedure: ATHERECTOMY;  Surgeon: Lorretta Harp, MD;  Location: [facility] CATH LAB;  Service:  Cardiovascular;  Laterality: Right;  Anterior Tibial Artery  . CARDIAC CATHETERIZATION  10/01/2001   THERE WAS GLOBAL HYPOKINESIS AND EF 25%. THERE APPEARED TO BE GLOBAL DECREASE IN WALL MOTION  . CARDIAC DEFIBRILLATOR PLACEMENT  06/2009   WITH GENERATOR REPLACED; BiV ICD  . CARDIOVASCULAR STRESS TEST  03/20/2009   EF 33%  . [place]  . COLONOSCOPY    . ESOPHAGOGASTRODUODENOSCOPY    . EUS Left 12/15/2016   Procedure: UPPER ENDOSCOPIC ULTRASOUND (EUS) LINEAR;  Surgeon: Arta Silence, MD;  Location: [facility] ENDOSCOPY;  Service: Endoscopy;  Laterality: Left;  . HIP ARTHROPLASTY Left 12/28/2015   Procedure: POSTERIOR  APPROACH HEMI HIP ARTHROPLASTY;  Surgeon: Leandrew Koyanagi, MD;  Location: [place];  Service: Orthopedics;  Laterality: Left;  . IR CATHETER TUBE CHANGE  12/08/2016  . IR GENERIC HISTORICAL  10/18/2016   IR PERC CHOLECYSTOSTOMY 10/18/2016 [name], MD [facility]-INTER[facility] RAD  . LEG AMPUTATION BELOW  KNEE Left 11/09/2013   DR Erlinda Hong  . LITHOTRIPSY  2001  . LOWER EXTREMITY ANGIOGRAM Bilateral 09/12/2013   Procedure: LOWER EXTREMITY ANGIOGRAM;  Surgeon: Lorretta Harp, MD;  Location: [facility] CATH LAB;  Service: Cardiovascular;  Laterality: Bilateral;  . LOWER EXTREMITY ANGIOGRAM N/A 12/29/2013   Procedure: LOWER EXTREMITY ANGIOGRAM;  Surgeon: Lorretta Harp, MD;  Location: [facility] CATH LAB;  Service: Cardiovascular;  Laterality: N/A;  . STUMP REVISION Left 01/03/2015   Procedure: LEFT BELOW KNEE AMPUTATION REVISION;  Surgeon: Leandrew Koyanagi, MD;  Location: [place];  Service: Orthopedics;  Laterality: Left;  . TOE AMPUTATION  10/04/2013   LEFT GREAT TOE AND 4TH TOE   /   DR Erlinda Hong  . TRANSLUMINAL ATHERECTOMY TIBIAL ARTERY Left 09/12/2013  . US ECHOCARDIOGRAPHY  03/21/2008   EF 30-35%       Home Medications    Prior to Admission medications   Medication Sig Start Date End Date Taking? Authorizing Provider  acetaminophen (TYLENOL) 325 MG tablet Take 2 tablets (650 mg total) by mouth every 6 (six) hours  as needed for mild pain (or Fever >/= 101). 05/03/16  Yes Mikhail, [place], DO  alum & mag hydroxide-simeth (MAALOX/MYLANTA) 200-200-20 MG/5ML suspension Take 15 mLs by mouth every 4 (four) hours as needed for indigestion or heartburn. 12/16/16  Yes Mikhail, Velta Addison, DO  atorvastatin (LIPITOR) 10 MG tablet Take 1 tablet (10 mg total) by mouth daily. 01/15/15  Yes Angiulli, Lavon Paganini, PA-C  carvedilol (COREG) 6.25 MG tablet Take 1 tablet (6.25 mg total) by mouth 2 (two) times daily with a meal. 10/23/16  Yes Velvet Bathe, MD  cholecalciferol (VITAMIN D) 1000 units tablet Take 1,000 Units by mouth daily.   Yes [provider]  furosemide (LASIX) 40 MG tablet Take 1 tablet (40 mg total) by mouth daily. 01/02/16  Yes Theodis Blaze, MD  insulin detemir (LEVEMIR) 100 UNIT/ML injection Inject 0.15 mLs (15 Units total) into the skin 2 (two) times daily. 10/23/16  Yes Velvet Bathe, MD  Insulin Glargine (TOUJEO SOLOSTAR) 300 UNIT/ML SOPN Inject 15 Units into the skin daily.   Yes [provider]  iron polysaccharides (NIFEREX) 150 MG capsule Take 1 capsule (150 mg total) by mouth daily. 01/15/15  Yes Angiulli, Lavon Paganini, PA-C  Multiple Vitamin (MULTIVITAMIN WITH MINERALS) TABS tablet Take 1 tablet by mouth daily.   Yes [provider]  omeprazole (PRILOSEC) 20 MG capsule Take 20 mg by mouth daily.   Yes [provider]  pregabalin (LYRICA) 75 MG capsule Take 1 capsule (75 mg total) by mouth 2 (two) times daily. 01/15/15  Yes Angiulli, Lavon Paganini, PA-C  protein supplement shake (PREMIER PROTEIN) LIQD Take 325 mLs (11 oz total) by mouth 2 (two) times daily between meals. 12/16/16  Yes Mikhail, [place], DO  saccharomyces boulardii (FLORASTOR) 250 MG capsule Take 1 capsule (250 mg total) by mouth 2 (two) times daily. 12/16/16  Yes Mikhail, Maryann, DO  sacubitril-valsartan (ENTRESTO) 24-26 MG Take 1 tablet by mouth 2 (two) times daily. 10/31/15  Yes Deboraha Sprang, MD  spironolactone  (ALDACTONE) 25 MG tablet Take 1 tablet (25 mg total) by mouth daily. 11/10/16  Yes Larey Dresser, MD  vancomycin (VANCOCIN) 125 MG capsule Take 125 mg by mouth 4 (four) times daily. 12/24/16 01/03/17 Yes [provider]  warfarin (COUMADIN) 10 MG tablet Take 10-15 mg by mouth every evening. Pt takes one and one-half tablet on Wednesday and one tablet all other days.  Yes [provider]    Family History Family History  Problem Relation Age of Onset  . Heart disease Mother   . Hypertension Mother   . Diabetes Mother   . Diabetes Father     Social History Social History  Substance Use Topics  . Smoking status: Never Smoker  . Smokeless tobacco: Never Used  . Alcohol use No     Allergies   Patient has no known allergies.   Review of Systems Review of Systems  Constitutional: Positive for fatigue. Negative for chills, diaphoresis and fever.  HENT: Negative for congestion and rhinorrhea.   Respiratory: Positive for cough, sputum production, shortness of breath and wheezing. Negative for chest tightness and stridor.   Cardiovascular: Positive for leg swelling. Negative for chest pain and syncope.  Gastrointestinal: Positive for abdominal distention. Negative for abdominal pain (chronic and unchange), constipation, diarrhea, nausea and vomiting.  Genitourinary: Positive for decreased urine volume. Negative for dysuria.  Musculoskeletal: Negative for back pain.  Skin: Negative for rash and wound.  Neurological: Negative for light-headedness, numbness and headaches.  Psychiatric/Behavioral: Negative for agitation.     Physical Exam Updated Vital Signs BP (!) 108/57   Pulse 98   Temp (!) 100.5 F (38.1 C) (Oral)   Resp 20   Ht 5\' 9"  (1.753 m)   Wt 195 lb (88.5 kg)   SpO2 92%   BMI 28.80 kg/m   Physical Exam  Constitutional: He is oriented to person, place, and time. He appears well-developed and well-nourished. No distress.  HENT:  Head:  Normocephalic and atraumatic.  Mouth/Throat: Oropharynx is clear and moist.  Eyes: Conjunctivae and EOM are normal. Pupils are equal, round, and reactive to light.  Neck: Normal range of motion. Neck supple.  Cardiovascular: Normal rate and regular rhythm.   No murmur heard. Pulmonary/Chest: Effort normal. No stridor. No respiratory distress. He has wheezes. He has no rhonchi. He has rales. He exhibits no tenderness.  Abdominal: Soft. There is no tenderness. There is no rebound.  Musculoskeletal: He exhibits no edema or tenderness.  Neurological: He is alert and oriented to person, place, and time. No cranial nerve deficit or sensory deficit. He exhibits normal muscle tone.  Skin: Skin is warm and dry. Capillary refill takes less than 2 seconds. He is not diaphoretic. No pallor.  Psychiatric: He has a normal mood and affect.  Nursing note and vitals reviewed.    ED Treatments / Results  Labs (all labs ordered are listed, but only abnormal results are displayed) Labs Reviewed  CBC WITH DIFFERENTIAL/PLATELET - Abnormal; Notable for the following:       Result Value   RBC 3.62 (*)    Hemoglobin 9.0 (*)    HCT 26.6 (*)    MCV 73.5 (*)    MCH 24.9 (*)    RDW 20.7 (*)    Neutro Abs 8.3 (*)    Lymphs Abs 0.5 (*)    All other components within normal limits  PROTIME-INR - Abnormal; Notable for the following:    Prothrombin Time 24.1 (*)    All other components within normal limits  BRAIN NATRIURETIC PEPTIDE - Abnormal; Notable for the following:    B Natriuretic Peptide 1,012.6 (*)    All other components within normal limits  COMPREHENSIVE METABOLIC PANEL - Abnormal; Notable for the following:    Glucose, Bld 317 (*)    BUN 24 (*)    Calcium 8.3 (*)    Albumin 2.6 (*)  Alkaline Phosphatase 131 (*)    GFR calc non Af Amer 58 (*)    All other components within normal limits  CBC - Abnormal; Notable for the following:    RBC 3.44 (*)    Hemoglobin 8.2 (*)    HCT 25.4 (*)     MCV 73.8 (*)    MCH 23.8 (*)    RDW 20.0 (*)    All other components within normal limits  PROTIME-INR - Abnormal; Notable for the following:    Prothrombin Time 22.6 (*)    All other components within normal limits  GLUCOSE, CAPILLARY - Abnormal; Notable for the following:    Glucose-Capillary 236 (*)    All other components within normal limits  GLUCOSE, CAPILLARY - Abnormal; Notable for the following:    Glucose-Capillary 252 (*)    All other components within normal limits  LIPASE, BLOOD  HEMOGLOBIN A1C  I-STAT CG4 LACTIC ACID, ED  I-STAT TROPOININ, ED  I-STAT CG4 LACTIC ACID, ED  I-STAT TROPOININ, ED    EKG  EKG Interpretation  Date/Time:  Friday Jan 02 2017 22:35:56 EDT Ventricular Rate:  95 PR Interval:    QRS Duration: 120 QT Interval:  389 QTC Calculation: 489 R Axis:   166 Text Interpretation:  Atrial-sensed ventricular-paced rhythm No further analysis attempted due to paced rhythm Paced rhythm. no significant changes from prior,  No STEMI Confirmed by [facility] MD, [place] [phone]) on 01/03/2017 1:36:00 AM       Radiology Dg Chest 2 View  Result Date: 01/02/2017 CLINICAL DATA:  Acute onset of shortness of breath and generalized weakness. Initial encounter. EXAM: CHEST  2 VIEW COMPARISON:  Chest radiograph performed 12/08/2016 FINDINGS: The lungs are well-aerated. Vascular congestion is noted. Increased interstitial markings raise concern for mild interstitial edema. Small bilateral pleural effusions are seen. No pneumothorax is identified. The heart is borderline enlarged. A pacemaker/AICD is noted at the left chest wall, with leads ending at the right atrium, right ventricle and coronary sinus. No acute osseous abnormalities are seen. IMPRESSION: Vascular congestion and borderline cardiomegaly. Increased interstitial markings raise concern for mild interstitial edema. Small bilateral pleural effusions seen. Electronically Signed   By: Garald Balding M.D.   On:  01/02/2017 22:26    Procedures Procedures (including critical care time)  Medications Ordered in ED Medications  vancomycin (VANCOCIN) 50 mg/mL oral solution 125 mg (125 mg Oral Given 01/03/17 0914)  protein supplement (PREMIER PROTEIN) liquid (11 oz Oral Given 01/03/17 0913)  saccharomyces boulardii (FLORASTOR) capsule 250 mg (250 mg Oral Given 01/03/17 0902)  spironolactone (ALDACTONE) tablet 25 mg (25 mg Oral Given 01/03/17 0903)  carvedilol (COREG) tablet 6.25 mg (6.25 mg Oral Given 01/03/17 0901)  insulin detemir (LEVEMIR) injection 15 Units (15 Units Subcutaneous Given 01/03/17 0913)  sacubitril-valsartan (ENTRESTO) 24-26 mg per tablet (1 tablet Oral Given 01/03/17 0913)  atorvastatin (LIPITOR) tablet 10 mg (10 mg Oral Given 01/03/17 0901)  iron polysaccharides (NIFEREX) capsule 150 mg (150 mg Oral Given 01/03/17 0901)  pregabalin (LYRICA) capsule 75 mg (75 mg Oral Given 01/03/17 0901)  insulin aspart (novoLOG) injection 0-15 Units (8 Units Subcutaneous Given 01/03/17 1202)  insulin aspart (novoLOG) injection 0-5 Units (not administered)  potassium chloride SA (K-DUR,KLOR-CON) CR tablet 20 mEq (20 mEq Oral Given 01/03/17 0901)  ondansetron (ZOFRAN) tablet 4 mg (not administered)    Or  ondansetron (ZOFRAN) injection 4 mg (not administered)  acetaminophen (TYLENOL) tablet 650 mg (not administered)    Or  acetaminophen (TYLENOL) suppository 650  mg (not administered)  Warfarin - Pharmacist Dosing Inpatient ( Does not apply Duplicate 5/91/63 8466)  feeding supplement (ENSURE ENLIVE) (ENSURE ENLIVE) liquid 237 mL (237 mLs Oral Given 01/03/17 0903)  furosemide (LASIX) injection 40 mg (not administered)  ipratropium-albuterol (DUONEB) 0.5-2.5 (3) MG/3ML nebulizer solution 3 mL (3 mLs Nebulization Given 01/02/17 2250)  furosemide (LASIX) injection 40 mg (40 mg Intravenous Given 01/03/17 0132)  warfarin (COUMADIN) tablet 10 mg (10 mg Oral Given 01/03/17 0900)     Initial Impression / Assessment and  Plan / ED Course  I have reviewed the triage vital signs and the nursing notes.  Pertinent labs & imaging results that were available during my care of the patient were reviewed by me and considered in my medical decision making (see chart for details).     Malekai C Zambito is a 71 y.o. male with a past medical history significant for congestive heart failure (EF 20-25% 2/18) and defibrillator/pacemaker, atrial fibrillation on Coumadin therapy, hypertension, hyperlipidemia, diabetes, bilateral leg amputations, chronic kidney disease, anemia, and pancreatic cancer with recent acute cholecystitis gallbladder drain in place who presents with worsening shortness of breath and generalized edema. Patient is accompanied by family who report that patient has had generalized swelling in his arms and abdomen that he gets when he retains fluid. The patient has had no chest pain or palpitations but reports worsening fatigue and decreased energy. He has had decreased oral intake over the last several days. He's had worsened shortness of breath over the last 2 days and family feels like she is having worsening heart failure. He says that despite Lasix use, he has had decreased urine over the last several days. He is saying that he has chronic abdominal pain in the setting of his pancreatic cancer with cholecystitis but this has not worsened. He has had no significant nausea or vomiting. He has had no fevers or chills although the patient was febrile on arrival. He does not use oxygen at home but was placed on 3 L nasal cannula on arrival.   On my exam, patient has wheezing and crackles in all lung fields. Patient's abdomen is nontender. Patient's biliary drain does not appear infected. Vision is back is nontender. Patient has bilateral lower extremity amputations with no significant leg tenderness. The patient was febrile, tachycardic, and tachypneic on arrival.  Based on the patient's exam, suspect CHF exacerbation  however, with his fever on arrival, patient needs to have pneumonia ruled out.  Chest x-ray did not show pneumonia but does show evidence of edema. BNP returned elevated at greater than 1000. Suspect this is consistent with CHF exacerbation. Patient's other lab testing showed hemoglobin similar to prior. CMP did not show elevation of LFTs alkaline phosphatase was slightly elevated. Lactic acid elevated. Troponin not elevated. Lipase nonelevated.  All the patient is febrile, did not feel patient has pneumonia. Patient will be treated for CHF exacerbation. Patient also received a breathing treatment with improvement in wheezing. Patient will be admitted for IV diuresis as he is feeling his increased Lasix at home.    Final Clinical Impressions(s) / ED Diagnoses   Final diagnoses:  Acute on chronic congestive heart failure, unspecified heart failure type (HCC)     Clinical Impression: 1. Acute on chronic congestive heart failure, unspecified heart failure type [facility])     Disposition: Admit to Hospitalist service    Tegeler, Gwenyth Allegra, MD 01/03/17 1328

## 2017-01-02 NOTE — ED Notes (Signed)
Respiratory called for breathing treatment.

## 2017-01-02 NOTE — ED Notes (Signed)
EKG given to EDP,Tegeler,MD., for review. 

## 2017-01-02 NOTE — Addendum Note (Signed)
Addended by: Brynda Peon on: 01/02/2017 11:37 AM   Modules accepted: Orders

## 2017-01-02 NOTE — ED Triage Notes (Addendum)
Patient c/o SOB and weakness x 2 days. Hx CHF. Wife reports increase of lasix dose with no relief. Denies chest pain.

## 2017-01-02 NOTE — ED Notes (Signed)
Respiratory at bedside.

## 2017-01-03 ENCOUNTER — Encounter (HOSPITAL_COMMUNITY): Payer: Self-pay | Admitting: Family Medicine

## 2017-01-03 DIAGNOSIS — N183 Chronic kidney disease, stage 3 (moderate): Secondary | ICD-10-CM | POA: Diagnosis present

## 2017-01-03 DIAGNOSIS — A0472 Enterocolitis due to Clostridium difficile, not specified as recurrent: Secondary | ICD-10-CM | POA: Diagnosis present

## 2017-01-03 DIAGNOSIS — K219 Gastro-esophageal reflux disease without esophagitis: Secondary | ICD-10-CM | POA: Diagnosis present

## 2017-01-03 DIAGNOSIS — E11649 Type 2 diabetes mellitus with hypoglycemia without coma: Secondary | ICD-10-CM | POA: Diagnosis not present

## 2017-01-03 DIAGNOSIS — R0600 Dyspnea, unspecified: Secondary | ICD-10-CM | POA: Diagnosis not present

## 2017-01-03 DIAGNOSIS — I5023 Acute on chronic systolic (congestive) heart failure: Secondary | ICD-10-CM | POA: Diagnosis not present

## 2017-01-03 DIAGNOSIS — E1151 Type 2 diabetes mellitus with diabetic peripheral angiopathy without gangrene: Secondary | ICD-10-CM | POA: Diagnosis present

## 2017-01-03 DIAGNOSIS — K6289 Other specified diseases of anus and rectum: Secondary | ICD-10-CM | POA: Diagnosis not present

## 2017-01-03 DIAGNOSIS — C250 Malignant neoplasm of head of pancreas: Secondary | ICD-10-CM | POA: Diagnosis present

## 2017-01-03 DIAGNOSIS — D509 Iron deficiency anemia, unspecified: Secondary | ICD-10-CM | POA: Diagnosis not present

## 2017-01-03 DIAGNOSIS — C259 Malignant neoplasm of pancreas, unspecified: Secondary | ICD-10-CM | POA: Diagnosis not present

## 2017-01-03 DIAGNOSIS — Z794 Long term (current) use of insulin: Secondary | ICD-10-CM | POA: Diagnosis not present

## 2017-01-03 DIAGNOSIS — E1122 Type 2 diabetes mellitus with diabetic chronic kidney disease: Secondary | ICD-10-CM | POA: Diagnosis present

## 2017-01-03 DIAGNOSIS — I130 Hypertensive heart and chronic kidney disease with heart failure and stage 1 through stage 4 chronic kidney disease, or unspecified chronic kidney disease: Secondary | ICD-10-CM | POA: Diagnosis present

## 2017-01-03 DIAGNOSIS — I4891 Unspecified atrial fibrillation: Secondary | ICD-10-CM | POA: Diagnosis not present

## 2017-01-03 DIAGNOSIS — E119 Type 2 diabetes mellitus without complications: Secondary | ICD-10-CM | POA: Diagnosis not present

## 2017-01-03 DIAGNOSIS — Z89512 Acquired absence of left leg below knee: Secondary | ICD-10-CM | POA: Diagnosis not present

## 2017-01-03 DIAGNOSIS — E1165 Type 2 diabetes mellitus with hyperglycemia: Secondary | ICD-10-CM | POA: Diagnosis present

## 2017-01-03 DIAGNOSIS — C251 Malignant neoplasm of body of pancreas: Secondary | ICD-10-CM | POA: Diagnosis not present

## 2017-01-03 DIAGNOSIS — D638 Anemia in other chronic diseases classified elsewhere: Secondary | ICD-10-CM

## 2017-01-03 DIAGNOSIS — Z95810 Presence of automatic (implantable) cardiac defibrillator: Secondary | ICD-10-CM | POA: Diagnosis not present

## 2017-01-03 DIAGNOSIS — E7800 Pure hypercholesterolemia, unspecified: Secondary | ICD-10-CM | POA: Diagnosis present

## 2017-01-03 DIAGNOSIS — I429 Cardiomyopathy, unspecified: Secondary | ICD-10-CM | POA: Diagnosis present

## 2017-01-03 DIAGNOSIS — D500 Iron deficiency anemia secondary to blood loss (chronic): Secondary | ICD-10-CM | POA: Diagnosis present

## 2017-01-03 DIAGNOSIS — Z7901 Long term (current) use of anticoagulants: Secondary | ICD-10-CM | POA: Diagnosis not present

## 2017-01-03 DIAGNOSIS — I739 Peripheral vascular disease, unspecified: Secondary | ICD-10-CM | POA: Diagnosis not present

## 2017-01-03 DIAGNOSIS — I480 Paroxysmal atrial fibrillation: Secondary | ICD-10-CM | POA: Diagnosis not present

## 2017-01-03 DIAGNOSIS — R338 Other retention of urine: Secondary | ICD-10-CM | POA: Diagnosis not present

## 2017-01-03 DIAGNOSIS — I428 Other cardiomyopathies: Secondary | ICD-10-CM

## 2017-01-03 DIAGNOSIS — Y92230 Patient room in hospital as the place of occurrence of the external cause: Secondary | ICD-10-CM | POA: Diagnosis not present

## 2017-01-03 DIAGNOSIS — I2510 Atherosclerotic heart disease of native coronary artery without angina pectoris: Secondary | ICD-10-CM | POA: Diagnosis present

## 2017-01-03 DIAGNOSIS — E118 Type 2 diabetes mellitus with unspecified complications: Secondary | ICD-10-CM | POA: Diagnosis not present

## 2017-01-03 DIAGNOSIS — I472 Ventricular tachycardia: Secondary | ICD-10-CM | POA: Diagnosis present

## 2017-01-03 DIAGNOSIS — R627 Adult failure to thrive: Secondary | ICD-10-CM | POA: Diagnosis present

## 2017-01-03 DIAGNOSIS — N179 Acute kidney failure, unspecified: Secondary | ICD-10-CM | POA: Diagnosis not present

## 2017-01-03 DIAGNOSIS — T501X5A Adverse effect of loop [high-ceiling] diuretics, initial encounter: Secondary | ICD-10-CM | POA: Diagnosis not present

## 2017-01-03 DIAGNOSIS — I509 Heart failure, unspecified: Secondary | ICD-10-CM | POA: Diagnosis not present

## 2017-01-03 DIAGNOSIS — N189 Chronic kidney disease, unspecified: Secondary | ICD-10-CM | POA: Diagnosis not present

## 2017-01-03 DIAGNOSIS — R509 Fever, unspecified: Secondary | ICD-10-CM | POA: Diagnosis not present

## 2017-01-03 DIAGNOSIS — K769 Liver disease, unspecified: Secondary | ICD-10-CM | POA: Diagnosis present

## 2017-01-03 DIAGNOSIS — I10 Essential (primary) hypertension: Secondary | ICD-10-CM | POA: Diagnosis not present

## 2017-01-03 DIAGNOSIS — E785 Hyperlipidemia, unspecified: Secondary | ICD-10-CM | POA: Diagnosis present

## 2017-01-03 LAB — CBC
HEMATOCRIT: 25.4 % — AB (ref 39.0–52.0)
Hemoglobin: 8.2 g/dL — ABNORMAL LOW (ref 13.0–17.0)
MCH: 23.8 pg — ABNORMAL LOW (ref 26.0–34.0)
MCHC: 32.3 g/dL (ref 30.0–36.0)
MCV: 73.8 fL — ABNORMAL LOW (ref 78.0–100.0)
PLATELETS: 305 10*3/uL (ref 150–400)
RBC: 3.44 MIL/uL — ABNORMAL LOW (ref 4.22–5.81)
RDW: 20 % — AB (ref 11.5–15.5)
WBC: 6.4 10*3/uL (ref 4.0–10.5)

## 2017-01-03 LAB — GLUCOSE, CAPILLARY
GLUCOSE-CAPILLARY: 236 mg/dL — AB (ref 65–99)
Glucose-Capillary: 252 mg/dL — ABNORMAL HIGH (ref 65–99)
Glucose-Capillary: 147 mg/dL — ABNORMAL HIGH (ref 65–99)
Glucose-Capillary: 155 mg/dL — ABNORMAL HIGH (ref 65–99)

## 2017-01-03 LAB — COMPREHENSIVE METABOLIC PANEL
ALT: 19 U/L (ref 17–63)
AST: 16 U/L (ref 15–41)
Albumin: 2.6 g/dL — ABNORMAL LOW (ref 3.5–5.0)
Alkaline Phosphatase: 131 U/L — ABNORMAL HIGH (ref 38–126)
Anion gap: 7 (ref 5–15)
BUN: 24 mg/dL — ABNORMAL HIGH (ref 6–20)
CHLORIDE: 106 mmol/L (ref 101–111)
CO2: 24 mmol/L (ref 22–32)
CREATININE: 1.2 mg/dL (ref 0.61–1.24)
Calcium: 8.3 mg/dL — ABNORMAL LOW (ref 8.9–10.3)
GFR calc non Af Amer: 58 mL/min — ABNORMAL LOW (ref >60)
Glucose, Bld: 317 mg/dL — ABNORMAL HIGH (ref 65–99)
Potassium: 4.6 mmol/L (ref 3.5–5.1)
SODIUM: 137 mmol/L (ref 135–145)
Total Bilirubin: 0.4 mg/dL (ref 0.3–1.2)
Total Protein: 6.6 g/dL (ref 6.5–8.1)

## 2017-01-03 LAB — PROTIME-INR
INR: 1.95
Prothrombin Time: 22.6 seconds — ABNORMAL HIGH (ref 11.4–15.2)

## 2017-01-03 LAB — I-STAT TROPONIN, ED: Troponin i, poc: 0.02 ng/mL (ref 0.00–0.08)

## 2017-01-03 LAB — LIPASE, BLOOD: Lipase: 16 U/L (ref 11–51)

## 2017-01-03 LAB — I-STAT CG4 LACTIC ACID, ED: LACTIC ACID, VENOUS: 0.75 mmol/L (ref 0.5–1.9)

## 2017-01-03 LAB — BRAIN NATRIURETIC PEPTIDE: B Natriuretic Peptide: 1012.6 pg/mL — ABNORMAL HIGH (ref 0.0–100.0)

## 2017-01-03 MED ORDER — FUROSEMIDE 10 MG/ML IJ SOLN
40.0000 mg | Freq: Two times a day (BID) | INTRAMUSCULAR | Status: DC
  Administered 2017-01-03: 40 mg via INTRAVENOUS

## 2017-01-03 MED ORDER — VANCOMYCIN 50 MG/ML ORAL SOLUTION
125.0000 mg | Freq: Four times a day (QID) | ORAL | Status: AC
  Administered 2017-01-03 (×4): 125 mg via ORAL
  Filled 2017-01-03 (×4): qty 2.5

## 2017-01-03 MED ORDER — SACCHAROMYCES BOULARDII 250 MG PO CAPS
250.0000 mg | ORAL_CAPSULE | Freq: Two times a day (BID) | ORAL | Status: DC
  Administered 2017-01-03 – 2017-01-08 (×10): 250 mg via ORAL
  Filled 2017-01-03 (×12): qty 1

## 2017-01-03 MED ORDER — PREGABALIN 75 MG PO CAPS
75.0000 mg | ORAL_CAPSULE | Freq: Two times a day (BID) | ORAL | Status: DC
  Administered 2017-01-03 – 2017-01-08 (×10): 75 mg via ORAL
  Filled 2017-01-03 (×11): qty 1

## 2017-01-03 MED ORDER — SPIRONOLACTONE 25 MG PO TABS
25.0000 mg | ORAL_TABLET | Freq: Every day | ORAL | Status: DC
  Administered 2017-01-03 – 2017-01-05 (×3): 25 mg via ORAL
  Filled 2017-01-03 (×3): qty 1

## 2017-01-03 MED ORDER — SACUBITRIL-VALSARTAN 24-26 MG PO TABS
1.0000 | ORAL_TABLET | Freq: Two times a day (BID) | ORAL | Status: DC
  Administered 2017-01-03 – 2017-01-08 (×10): 1 via ORAL
  Filled 2017-01-03 (×11): qty 1

## 2017-01-03 MED ORDER — WARFARIN - PHARMACIST DOSING INPATIENT: Freq: Every day | Status: DC

## 2017-01-03 MED ORDER — POLYSACCHARIDE IRON COMPLEX 150 MG PO CAPS
150.0000 mg | ORAL_CAPSULE | Freq: Every day | ORAL | Status: DC
  Administered 2017-01-03 – 2017-01-08 (×5): 150 mg via ORAL
  Filled 2017-01-03 (×6): qty 1

## 2017-01-03 MED ORDER — ENSURE ENLIVE PO LIQD
237.0000 mL | Freq: Two times a day (BID) | ORAL | Status: DC
  Administered 2017-01-03 – 2017-01-08 (×8): 237 mL via ORAL

## 2017-01-03 MED ORDER — FUROSEMIDE 10 MG/ML IJ SOLN
40.0000 mg | Freq: Once | INTRAMUSCULAR | Status: AC
  Administered 2017-01-03: 40 mg via INTRAVENOUS
  Filled 2017-01-03: qty 4

## 2017-01-03 MED ORDER — WARFARIN SODIUM 5 MG PO TABS
10.0000 mg | ORAL_TABLET | Freq: Once | ORAL | Status: AC
  Administered 2017-01-03: 10 mg via ORAL
  Filled 2017-01-03: qty 2

## 2017-01-03 MED ORDER — ATORVASTATIN CALCIUM 10 MG PO TABS
10.0000 mg | ORAL_TABLET | Freq: Every day | ORAL | Status: DC
  Administered 2017-01-03 – 2017-01-08 (×6): 10 mg via ORAL
  Filled 2017-01-03 (×6): qty 1

## 2017-01-03 MED ORDER — ACETAMINOPHEN 650 MG RE SUPP: 650.0000 mg | Freq: Four times a day (QID) | RECTAL | Status: DC | PRN

## 2017-01-03 MED ORDER — POTASSIUM CHLORIDE CRYS ER 20 MEQ PO TBCR
20.0000 meq | EXTENDED_RELEASE_TABLET | Freq: Two times a day (BID) | ORAL | Status: DC
  Administered 2017-01-03 – 2017-01-08 (×11): 20 meq via ORAL
  Filled 2017-01-03 (×11): qty 1

## 2017-01-03 MED ORDER — CARVEDILOL 6.25 MG PO TABS
6.2500 mg | ORAL_TABLET | Freq: Two times a day (BID) | ORAL | Status: DC
  Administered 2017-01-03 – 2017-01-05 (×6): 6.25 mg via ORAL
  Filled 2017-01-03 (×7): qty 1

## 2017-01-03 MED ORDER — INSULIN ASPART 100 UNIT/ML ~~LOC~~ SOLN
0.0000 [IU] | Freq: Three times a day (TID) | SUBCUTANEOUS | Status: DC
  Administered 2017-01-03: 2 [IU] via SUBCUTANEOUS
  Administered 2017-01-03: 5 [IU] via SUBCUTANEOUS
  Administered 2017-01-03: 8 [IU] via SUBCUTANEOUS
  Administered 2017-01-04 (×2): 5 [IU] via SUBCUTANEOUS
  Administered 2017-01-05 (×2): 3 [IU] via SUBCUTANEOUS
  Administered 2017-01-05: 5 [IU] via SUBCUTANEOUS
  Administered 2017-01-06: 2 [IU] via SUBCUTANEOUS
  Administered 2017-01-06: 11 [IU] via SUBCUTANEOUS
  Administered 2017-01-06: 2 [IU] via SUBCUTANEOUS
  Administered 2017-01-07: 3 [IU] via SUBCUTANEOUS
  Administered 2017-01-07 – 2017-01-08 (×3): 8 [IU] via SUBCUTANEOUS
  Administered 2017-01-08: 5 [IU] via SUBCUTANEOUS

## 2017-01-03 MED ORDER — ONDANSETRON HCL 4 MG/2ML IJ SOLN
4.0000 mg | Freq: Four times a day (QID) | INTRAMUSCULAR | Status: DC | PRN
  Administered 2017-01-05 – 2017-01-06 (×3): 4 mg via INTRAVENOUS
  Filled 2017-01-03 (×3): qty 2

## 2017-01-03 MED ORDER — FUROSEMIDE 10 MG/ML IJ SOLN
40.0000 mg | Freq: Every day | INTRAMUSCULAR | Status: DC
  Administered 2017-01-04: 40 mg via INTRAVENOUS
  Filled 2017-01-03 (×2): qty 4

## 2017-01-03 MED ORDER — ONDANSETRON HCL 4 MG PO TABS: 4.0000 mg | ORAL_TABLET | Freq: Four times a day (QID) | ORAL | Status: DC | PRN

## 2017-01-03 MED ORDER — INSULIN ASPART 100 UNIT/ML ~~LOC~~ SOLN
0.0000 [IU] | Freq: Every day | SUBCUTANEOUS | Status: DC
  Administered 2017-01-05: 2 [IU] via SUBCUTANEOUS
  Administered 2017-01-07: 3 [IU] via SUBCUTANEOUS

## 2017-01-03 MED ORDER — PREMIER PROTEIN SHAKE
11.0000 [oz_av] | Freq: Two times a day (BID) | ORAL | Status: DC
  Administered 2017-01-03 – 2017-01-08 (×4): 11 [oz_av] via ORAL
  Filled 2017-01-03 (×12): qty 325.31

## 2017-01-03 MED ORDER — ACETAMINOPHEN 325 MG PO TABS
650.0000 mg | ORAL_TABLET | Freq: Four times a day (QID) | ORAL | Status: DC | PRN
  Administered 2017-01-06 – 2017-01-07 (×2): 650 mg via ORAL
  Filled 2017-01-03 (×2): qty 2

## 2017-01-03 MED ORDER — INSULIN DETEMIR 100 UNIT/ML ~~LOC~~ SOLN
15.0000 [IU] | Freq: Two times a day (BID) | SUBCUTANEOUS | Status: DC
  Administered 2017-01-03 (×3): 15 [IU] via SUBCUTANEOUS
  Filled 2017-01-03 (×4): qty 0.15

## 2017-01-03 NOTE — Progress Notes (Signed)
Inpatient Diabetes Program Recommendations  AACE/ADA: New Consensus Statement on Inpatient Glycemic Control (2015)  Target Ranges:  Prepandial:   less than 140 mg/dL      Peak postprandial:   less than 180 mg/dL (1-2 hours)      Critically ill patients:  140 - 180 mg/dL   Results for [name] [name], [name] [name] (MRN [id number]) as of 01/03/2017 15:47  Ref. 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  At time of discharge on 12/16/16, patient was discharged home on Levemir insulin 15 units BID (which is what was listed on his Home Med rec during that admission as well).  Not sure why patient ans wife are stating patient takes both Toujeo and Levemir at home?  Perhaps patient saw his PCP (Dr. Inda Merlin) between April and May and Dr. Inda Merlin changed pt to Toujeo insulin?  Would like to speak with Dr. Inda Merlin (PCP) office, however, their office is not open until Monday.  DM Coordinator will plan to call Dr. Inda Merlin office on Monday morning and will attempt to speak with pt and wife on Monday morning as well to clarify home insulin regimen.    --Will follow patient during hospitalization--  Wyn Quaker RN, MSN, CDE Diabetes Coordinator Inpatient Glycemic Control Team Team Pager: [phone] (8a-5p)

## 2017-01-03 NOTE — H&P (Signed)
History and Physical  Patient Name: [name] [name]     UMP:[id number]    DOB: October 27, 1945    DOA: 01/02/2017 PCP: Josetta Huddle, MD   Patient coming from: Home  Chief Complaint: SOB  HPI: [name] [name] is a 71 y.o. male with a past medical history significant for NICM EF 20%, BiV and ICD, IDDM, pAF on warfarin, bilateral BKA, and recently diagnosed pancreatic cancer who presents with 2 days SOB.  Over the last 2-3 days the patient has developed acute on chronic weakness, as well as a new cough and fatigue with exertion.  No orthopnea, PND, obvious exertional component, leg/stump/abdomen swelling.  [place] nurse came today and recommended extra dose of Lasix, which produced no urine output, so he came to the ER.  He had had no purulent sputum, fever.    ED course: -Temp 100.55F, heart rate 115 in A. Fib, RR 24, BP 129/64, pulse ox 92% on room air -Na 137, K 4.6, Cr 1.2 (baseline 1.0), WBC 9.4K, Hgb 9 and microcytic and stable -Lipase normal -INR therapeutic -BNP >1000 -Troponin negative -Lactic acid 1.1 -CXR with interstitial edema -ECG paced -He was given furosemide and TRH were asked to evaluate for CHF flare   Of note, the patient had an episode of cholecystitis requiring admission and cholecystostomy tube this spring.  During workup, he was noted to have a new mass in the pancreatic head which has since been biopsied and is adenocarcinoma.  He is not a Whipple candidate.  He saw Dr. Lindi Adie who recommended Hospice.  He saw Dr. Benay Spice who offered to present the case to the tumor board on 12/31/16.     ROS: Review of Systems  Constitutional: Positive for malaise/fatigue. Negative for chills and fever.  Respiratory: Positive for cough and shortness of breath. Negative for sputum production and wheezing.   Cardiovascular: Negative for chest pain, orthopnea and leg swelling.  Neurological: Positive for weakness.  All other systems reviewed and are negative.         Past  Medical History:  Diagnosis Date  . Anemia    takes Iron pill daily  . Arthritis   . Automatic implantable cardioverter-defibrillator in situ    a. 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  . CAD (coronary artery disease)   . Cardiomyopathy, nonischemic ([place])    a. Cath 2003: mild nonobstructive CAD, EF 25% at that time.  . Chronic systolic CHF (congestive heart failure) (HCC)    takes Furosemide daily as well as Aldactone  . CKD (chronic kidney disease), stage III   . Complication of anesthesia   . Constipation    takes Sennokot daily  . Dementia   . Gangrenous toe, Lt toe 09/21/2013  . GERD (gastroesophageal reflux disease)    takes Nexium daily  . Headache    occasionally  . High cholesterol    takes Atorvastatin daily  . History of blood transfusion    no abnormal reaction noted  . History of bronchitis    > 1 yr ago  . History of kidney stones   . Hypertension    takes Coreg daily  . LBBB (left bundle branch block)    takes Coumadin daily  . NSVT (nonsustained ventricular tachycardia) ([place])    a. Noted on ICD interrogation in 2011.  [name] PAD (peripheral artery disease) ([place])    a. 08/2013 Periph Angio/PTA: Abd Ao nl, RLE- 3v runoff, PT diff dzs, AT 90p, LLE 2v runoff, PT 100, AT  80m (diamondback ORA/chocolate balloon PTA).  [name] PAF (paroxysmal atrial fibrillation) ([place])    a. Noted on ICD interrogation 2012;  b. coumadin d/c'd 01/2013.  [name] PAF (paroxysmal atrial fibrillation) ([place])   . Peripheral neuropathy    hands;numbness and tingling   . Pneumonia    hx of > 73yr ago  . PONV (postoperative nausea and vomiting)   . Presence of permanent cardiac pacemaker   . Type II diabetes mellitus ([place])    takes Levemir daily as well as Novolog  . Urinary frequency   . Urinary urgency     Past Surgical History:  Procedure Laterality Date  . ABDOMINAL ANGIOGRAM  09/12/2013   Procedure: ABDOMINAL ANGIOGRAM;  Surgeon: Lorretta Harp, MD;  Location: [facility]  CATH LAB;  Service: Cardiovascular;;  . AMPUTATION Left 10/04/2013   Procedure: LEFT GREAT TOE AND SECOND TOE AMPUTATION;  Surgeon: Marianna Payment, MD;  Location: [place];  Service: Orthopedics;  Laterality: Left;  . AMPUTATION Left 11/09/2013   Procedure: LEFT AMPUTATION BELOW KNEE;  Surgeon: Marianna Payment, MD;  Location: [place];  Service: Orthopedics;  Laterality: Left;  . AMPUTATION Right 01/04/2014   Procedure: Doristine Devoid second and third toe amputation;  Surgeon: Marianna Payment, MD;  Location: [place];  Service: Orthopedics;  Laterality: Right;  . AMPUTATION Right 01/30/2014   Procedure: RIGHT BELOW KNEE AMPUTATION;  Surgeon: Marianna Payment, MD;  Location: [place];  Service: Orthopedics;  Laterality: Right;  . ATHERECTOMY Right 12/29/2013   Procedure: ATHERECTOMY;  Surgeon: Lorretta Harp, MD;  Location: [facility] CATH LAB;  Service: Cardiovascular;  Laterality: Right;  Anterior Tibial Artery  . CARDIAC CATHETERIZATION  10/01/2001   THERE WAS GLOBAL HYPOKINESIS AND EF 25%. THERE APPEARED TO BE GLOBAL DECREASE IN WALL MOTION  . CARDIAC DEFIBRILLATOR PLACEMENT  06/2009   WITH GENERATOR REPLACED; BiV ICD  . CARDIOVASCULAR STRESS TEST  03/20/2009   EF 33%  . [place]  . COLONOSCOPY    . ESOPHAGOGASTRODUODENOSCOPY    . EUS Left 12/15/2016   Procedure: UPPER ENDOSCOPIC ULTRASOUND (EUS) LINEAR;  Surgeon: Arta Silence, MD;  Location: [facility];  Service: Endoscopy;  Laterality: Left;  . HIP ARTHROPLASTY Left 12/28/2015   Procedure: POSTERIOR  APPROACH HEMI HIP ARTHROPLASTY;  Surgeon: Leandrew Koyanagi, MD;  Location: [place];  Service: Orthopedics;  Laterality: Left;  . IR CATHETER TUBE CHANGE  12/08/2016  . IR GENERIC HISTORICAL  10/18/2016   IR PERC CHOLECYSTOSTOMY 10/18/2016 [name], MD [facility]-INTERV RAD  . LEG AMPUTATION BELOW KNEE Left 11/09/2013   DR Erlinda Hong  . LITHOTRIPSY  2001  . LOWER EXTREMITY ANGIOGRAM Bilateral 09/12/2013   Procedure: LOWER EXTREMITY ANGIOGRAM;  Surgeon: Lorretta Harp, MD;  Location: [facility] CATH LAB;  Service: Cardiovascular;  Laterality: Bilateral;  . LOWER EXTREMITY ANGIOGRAM N/A 12/29/2013   Procedure: LOWER EXTREMITY ANGIOGRAM;  Surgeon: Lorretta Harp, MD;  Location: [facility] CATH LAB;  Service: Cardiovascular;  Laterality: N/A;  . STUMP REVISION Left 01/03/2015   Procedure: LEFT BELOW KNEE AMPUTATION REVISION;  Surgeon: Leandrew Koyanagi, MD;  Location: [place];  Service: Orthopedics;  Laterality: Left;  . TOE AMPUTATION  10/04/2013   LEFT GREAT TOE AND 4TH TOE   /   DR Erlinda Hong  . TRANSLUMINAL ATHERECTOMY TIBIAL ARTERY Left 09/12/2013  . US ECHOCARDIOGRAPHY  03/21/2008   EF 30-35%    Social History: Patient lives with his wife.  The patient walks with a walker and prosthetics, not  very mobile recently.  He does not smoke.   No Known Allergies  Family history: family history includes Diabetes in his father and mother; Heart disease in his mother; Hypertension in his mother.  Prior to Admission medications   Medication Sig Start Date End Date Taking? Authorizing Provider  acetaminophen (TYLENOL) 325 MG tablet Take 2 tablets (650 mg total) by mouth every 6 (six) hours as needed for mild pain (or Fever >/= 101). 05/03/16  Yes Mikhail, [place], DO  alum & mag hydroxide-simeth (MAALOX/MYLANTA) 200-200-20 MG/5ML suspension Take 15 mLs by mouth every 4 (four) hours as needed for indigestion or heartburn. 12/16/16  Yes Mikhail, Velta Addison, DO  atorvastatin (LIPITOR) 10 MG tablet Take 1 tablet (10 mg total) by mouth daily. 01/15/15  Yes Angiulli, Lavon Paganini, PA-C  carvedilol (COREG) 6.25 MG tablet Take 1 tablet (6.25 mg total) by mouth 2 (two) times daily with a meal. 10/23/16  Yes Velvet Bathe, MD  cholecalciferol (VITAMIN D) 1000 units tablet Take 1,000 Units by mouth daily.   Yes [provider]  furosemide (LASIX) 40 MG tablet Take 1 tablet (40 mg total) by mouth daily. 01/02/16  Yes Theodis Blaze, MD  insulin detemir (LEVEMIR) 100 UNIT/ML injection Inject 0.15 mLs (15  Units total) into the skin 2 (two) times daily. 10/23/16  Yes Velvet Bathe, MD  Insulin Glargine (TOUJEO SOLOSTAR) 300 UNIT/ML SOPN Inject 15 Units into the skin daily.   Yes [provider]  iron polysaccharides (NIFEREX) 150 MG capsule Take 1 capsule (150 mg total) by mouth daily. 01/15/15  Yes Angiulli, Lavon Paganini, PA-C  Multiple Vitamin (MULTIVITAMIN WITH MINERALS) TABS tablet Take 1 tablet by mouth daily.   Yes [provider]  omeprazole (PRILOSEC) 20 MG capsule Take 20 mg by mouth daily.   Yes [provider]  pregabalin (LYRICA) 75 MG capsule Take 1 capsule (75 mg total) by mouth 2 (two) times daily. 01/15/15  Yes Angiulli, Lavon Paganini, PA-C  protein supplement shake (PREMIER PROTEIN) LIQD Take 325 mLs (11 oz total) by mouth 2 (two) times daily between meals. 12/16/16  Yes Mikhail, [place], DO  saccharomyces boulardii (FLORASTOR) 250 MG capsule Take 1 capsule (250 mg total) by mouth 2 (two) times daily. 12/16/16  Yes Mikhail, Maryann, DO  sacubitril-valsartan (ENTRESTO) 24-26 MG Take 1 tablet by mouth 2 (two) times daily. 10/31/15  Yes Deboraha Sprang, MD  spironolactone (ALDACTONE) 25 MG tablet Take 1 tablet (25 mg total) by mouth daily. 11/10/16  Yes Larey Dresser, MD  vancomycin (VANCOCIN) 125 MG capsule Take 125 mg by mouth 4 (four) times daily. 12/24/16 01/03/17 Yes [provider]  warfarin (COUMADIN) 10 MG tablet Take 10-15 mg by mouth every evening. Pt takes one and one-half tablet on Wednesday and one tablet all other days.   Yes [provider]       Physical Exam: BP 111/70 (BP Location: Right Arm)   Pulse 84   Temp 98 F (36.7 C) (Oral)   Resp 20   Ht 5\' 9"  (1.753 m)   Wt 91.4 kg (201 lb 8 oz)   SpO2 94%   BMI 29.76 kg/m  General appearance: Elderly adult male, alert and in no obvious distress.   Eyes: Anicteric, conjunctiva pink, lids and lashes normal. PERRL.    ENT: No nasal deformity, discharge, epistaxis.  Hearing seems normal. OP  moist without lesions.   Neck: No neck masses.  Trachea midline.  No thyromegaly/tenderness. Lymph: No cervical or supraclavicular lymphadenopathy.  Skin: Warm and dry.   Cardiac: Tachycardic, nl S1-S2, no murmurs appreciated.  Capillary refill is brisk.  JVP not visible.  Trace LE edema.  Radial pulses 2+ and symmetric. Respiratory: Normal respiratory rate and rhythm.  No wheezes.  Crackles at bases. Abdomen: Abdomen soft.  No TTP. No ascites, distension, hepatosplenomegaly.   MSK: No deformities or effusions.  No cyanosis or clubbing.  Bilateral BKA. Neuro: Cranial nerves normal.  Sensation intact to light touch. Speech is fluent.  Muscle strength globally weak.    Psych: Sensorium intact and responding to questions, attention normal.  Behavior appropriate.  Affect flat.  Judgment and insight appear normal.     Labs on Admission:  I have personally reviewed following labs and imaging studies: CBC:  Recent Labs Lab 01/02/17 2237  WBC 9.4  NEUTROABS 8.3*  HGB 9.0*  HCT 26.6*  MCV 73.5*  PLT 510   Basic Metabolic Panel:  Recent Labs Lab 01/03/17 0042  NA 137  K 4.6  CL 106  CO2 24  GLUCOSE 317*  BUN 24*  CREATININE 1.20  CALCIUM 8.3*   GFR: Estimated Creatinine Clearance: 61.3 mL/min (by C-G formula based on SCr of 1.2 mg/dL).  Liver Function Tests:  Recent Labs Lab 01/03/17 0042  AST 16  ALT 19  ALKPHOS 131*  BILITOT 0.4  PROT 6.6  ALBUMIN 2.6*    Recent Labs Lab 01/03/17 0042  LIPASE 16   No results for input(s): AMMONIA in the last 168 hours. Coagulation Profile:  Recent Labs Lab 12/31/16 01/02/17 2237  INR 1.6* 2.12   Cardiac Enzymes: No results for input(s): CKTOTAL, CKMB, CKMBINDEX, TROPONINI in the last 168 hours. BNP (last 3 results) No results for input(s): PROBNP in the last 8760 hours. HbA1C: No results for input(s): HGBA1C in the last 72 hours. CBG: No results for input(s): GLUCAP in the last 168 hours. Lipid Profile: No results  for input(s): CHOL, HDL, LDLCALC, TRIG, CHOLHDL, LDLDIRECT in the last 72 hours. Thyroid Function Tests: No results for input(s): TSH, T4TOTAL, FREET4, T3FREE, THYROIDAB in the last 72 hours. Anemia Panel: No results for input(s): VITAMINB12, FOLATE, FERRITIN, TIBC, IRON, RETICCTPCT in the last 72 hours. Sepsis Labs: Lactic acid 1.1 Invalid input(s): PROCALCITONIN, LACTICIDVEN No results found for this or any previous visit (from the past 240 hour(s)).       Radiological Exams on Admission: Personally reviewed CXR shows trace edema: Dg Chest 2 View  Result Date: 01/02/2017 CLINICAL DATA:  Acute onset of shortness of breath and generalized weakness. Initial encounter. EXAM: CHEST  2 VIEW COMPARISON:  Chest radiograph performed 12/08/2016 FINDINGS: The lungs are well-aerated. Vascular congestion is noted. Increased interstitial markings raise concern for mild interstitial edema. Small bilateral pleural effusions are seen. No pneumothorax is identified. The heart is borderline enlarged. A pacemaker/AICD is noted at the left chest wall, with leads ending at the right atrium, right ventricle and coronary sinus. No acute osseous abnormalities are seen. IMPRESSION: Vascular congestion and borderline cardiomegaly. Increased interstitial markings raise concern for mild interstitial edema. Small bilateral pleural effusions seen. Electronically Signed   By: Garald Balding M.D.   On: 01/02/2017 22:26    EKG: Independently reviewed. Rate 95, paced.    Assessment/Plan  1. Acute on chronic systolic CHF:  From NICM, EF 20-25% in February.  ICD and BiV in place. No recent medication nonadherence, or increased intake.   -Furosemide 40 mg twice daily -Potassium supplement -Strict ins and outs -Continue beta blocker, Entresto, Aldactone  2. Fever:  Abdomen exam benign.  No focal opacity on CXR but will monitor fever curve and cough. -Culture for fever -Repeat CBC  3. Insulin-dependent diabetes:    Hyperglycemic at admission -Continue Levemir -SSI with meals -Consult to diabetes education (patient's wife stated during med rec that he gets both Tuojeo and Levemir, suggesting some confusion about his regimen)  4. Paroxysmal atrial fibrillation:  CHADS2-VASc 4, on warfarin -Continue warfarin -Continue beta blocker  5. Pancreatic adenocarcinoma:  Recently diagnosed. Not candidate for Whipple.  Suspected metastasis.  6. C. difficile:  Resolving, formed BMS recently. -One last day of vancomycin  7. Anemia:  Stable -Continue iron     DVT prophylaxis: N/a  Code Status: FULL  Family Communication: Wife at bedside  Disposition Plan: Anticipate IV diuresis for 2-3 days and then discharge Consults called: None Admission status: INPATIENT        Medical decision making: Patient seen at 3:16 AM on 01/03/2017.  The patient was discussed with Dr. Sherry Ruffing.  What exists of the patient's chart was reviewed in depth and summarized above.  Clinical condition: stable.        Edwin Dada [facility] Pager [phone]       At the time of admission, it appears that the appropriate admission status for this patient is INPATIENT. This is judged to be reasonable and necessary in order to provide the required intensity of service to ensure the patient's safety given the presenting symptoms, physical exam findings, and initial radiographic and laboratory data in the context of their chronic comorbidities.  Together, these circumstances are felt to place him at high risk for further clinical deterioration threatening life, limb, or organ.   Patient requires inpatient status due to high intensity of service, high risk for further deterioration and high frequency of surveillance required because of this severe exacerbation of their chronic organ failure.  I certify that at the point of admission it is my clinical judgment that the patient will require inpatient hospital  care spanning beyond 2 midnights from the point of admission and that early discharge would result in unnecessary risk of decompensation and readmission or threat to life, limb or bodily function.

## 2017-01-03 NOTE — Progress Notes (Signed)
ANTICOAGULATION CONSULT NOTE -   Pharmacy Consult for warfarin Indication: atrial fibrillation  No Known Allergies  Patient Measurements: Height: 5\' 9"  (175.3 cm) Weight: 201 lb 8 oz (91.4 kg) IBW/kg (Calculated) : 70.7 Heparin Dosing Weight:   Vital Signs: Temp: 98 F (36.7 C) (05/12 0653) Temp Source: Oral (05/12 0653) BP: 119/86 (05/12 0653) Pulse Rate: 84 (05/12 0653)  Labs:  Recent Labs  01/02/17 2237 01/03/17 0042 01/03/17 0518  HGB 9.0*  --  8.2*  HCT 26.6*  --  25.4*  PLT 340  --  305  LABPROT 24.1*  --  22.6*  INR 2.12  --  1.95  CREATININE  --  1.20  --     Estimated Creatinine Clearance: 61.3 mL/min (by C-G formula based on SCr of 1.2 mg/dL).   Medical History: Past Medical History:  Diagnosis Date  . Anemia    takes Iron pill daily  . Arthritis   . Automatic implantable cardioverter-defibrillator in situ    a. 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  . CAD (coronary artery disease)   . Cardiomyopathy, nonischemic ([place])    a. Cath 2003: mild nonobstructive CAD, EF 25% at that time.  . Chronic systolic CHF (congestive heart failure) (HCC)    takes Furosemide daily as well as Aldactone  . CKD (chronic kidney disease), stage III   . Complication of anesthesia   . Constipation    takes Sennokot daily  . Dementia   . Gangrenous toe, Lt toe 09/21/2013  . GERD (gastroesophageal reflux disease)    takes Nexium daily  . Headache    occasionally  . High cholesterol    takes Atorvastatin daily  . History of blood transfusion    no abnormal reaction noted  . History of bronchitis    > 1 yr ago  . History of kidney stones   . Hypertension    takes Coreg daily  . LBBB (left bundle branch block)    takes Coumadin daily  . NSVT (nonsustained ventricular tachycardia) ([place])    a. Noted on ICD interrogation in 2011.  [name] PAD (peripheral artery disease) ([place])    a. 08/2013 Periph Angio/PTA: Abd Ao nl, RLE- 3v runoff, PT diff  dzs, AT 90p, LLE 2v runoff, PT 100, AT 62m (diamondback ORA/chocolate balloon PTA).  [name] PAF (paroxysmal atrial fibrillation) ([place])    a. Noted on ICD interrogation 2012;  b. coumadin d/c'd 01/2013.  [name] PAF (paroxysmal atrial fibrillation) ([place])   . Peripheral neuropathy    hands;numbness and tingling   . Pneumonia    hx of > 32yr ago  . PONV (postoperative nausea and vomiting)   . Presence of permanent cardiac pacemaker   . Type II diabetes mellitus ([place])    takes Levemir daily as well as Novolog  . Urinary frequency   . Urinary urgency     Medications:  Prescriptions Prior to Admission  Medication Sig Dispense Refill Last Dose  . acetaminophen (TYLENOL) 325 MG tablet Take 2 tablets (650 mg total) by mouth every 6 (six) hours as needed for mild pain (or Fever >/= 101).   01/01/2017 at 2100  . alum & mag hydroxide-simeth (MAALOX/MYLANTA) 200-200-20 MG/5ML suspension Take 15 mLs by mouth every 4 (four) hours as needed for indigestion or heartburn. 355 mL 0 Past Week at Unknown time  . atorvastatin (LIPITOR) 10 MG tablet Take 1 tablet (10 mg total) by mouth daily. 30 tablet 1 01/02/2017 at Unknown time  . carvedilol (  COREG) 6.25 MG tablet Take 1 tablet (6.25 mg total) by mouth 2 (two) times daily with a meal. 60 tablet 0 01/02/2017 at 0930  . cholecalciferol (VITAMIN D) 1000 units tablet Take 1,000 Units by mouth daily.   01/02/2017 at Unknown time  . furosemide (LASIX) 40 MG tablet Take 1 tablet (40 mg total) by mouth daily. 30 tablet 1 01/02/2017 at Unknown time  . insulin detemir (LEVEMIR) 100 UNIT/ML injection Inject 0.15 mLs (15 Units total) into the skin 2 (two) times daily. 10 mL 0 01/02/2017 at Unknown time  . Insulin Glargine (TOUJEO SOLOSTAR) 300 UNIT/ML SOPN Inject 15 Units into the skin daily.   01/02/2017 at Unknown time  . iron polysaccharides (NIFEREX) 150 MG capsule Take 1 capsule (150 mg total) by mouth daily. 30 capsule 1 01/02/2017 at Unknown time  . Multiple Vitamin (MULTIVITAMIN  WITH MINERALS) TABS tablet Take 1 tablet by mouth daily.   01/02/2017 at Unknown time  . omeprazole (PRILOSEC) 20 MG capsule Take 20 mg by mouth daily.   01/02/2017 at Unknown time  . pregabalin (LYRICA) 75 MG capsule Take 1 capsule (75 mg total) by mouth 2 (two) times daily. 60 capsule 1 01/02/2017 at Unknown time  . protein supplement shake (PREMIER PROTEIN) LIQD Take 325 mLs (11 oz total) by mouth 2 (two) times daily between meals. 60 Can 0 01/02/2017 at Unknown time  . saccharomyces boulardii (FLORASTOR) 250 MG capsule Take 1 capsule (250 mg total) by mouth 2 (two) times daily. 14 capsule 0 01/02/2017 at Unknown time  . sacubitril-valsartan (ENTRESTO) 24-26 MG Take 1 tablet by mouth 2 (two) times daily. 60 tablet 6 01/02/2017 at Unknown time  . spironolactone (ALDACTONE) 25 MG tablet Take 1 tablet (25 mg total) by mouth daily. 30 tablet 3 01/02/2017 at Unknown time  . vancomycin (VANCOCIN) 125 MG capsule Take 125 mg by mouth 4 (four) times daily.   01/02/2017 at Unknown time  . warfarin (COUMADIN) 10 MG tablet Take 10-15 mg by mouth every evening. Pt takes one and one-half tablet on Wednesday and one tablet all other days.   01/01/2017 at 1800   Scheduled:  . atorvastatin  10 mg Oral Daily  . carvedilol  6.25 mg Oral BID WC  . feeding supplement (ENSURE ENLIVE)  237 mL Oral BID BM  . furosemide  40 mg Intravenous BID  . insulin aspart  0-15 Units Subcutaneous TID WC  . insulin aspart  0-5 Units Subcutaneous QHS  . insulin detemir  15 Units Subcutaneous BID  . iron polysaccharides  150 mg Oral Daily  . potassium chloride  20 mEq Oral BID  . pregabalin  75 mg Oral BID  . protein supplement shake  11 oz Oral BID BM  . saccharomyces boulardii  250 mg Oral BID  . sacubitril-valsartan  1 tablet Oral BID  . spironolactone  25 mg Oral Daily  . vancomycin  125 mg Oral QID  . Warfarin - Pharmacist Dosing Inpatient   Does not apply q1800    Assessment: 71 yo male with past medical history significant  for NICM EF 20%, BiV and ICD, IDDM, pAF on warfarin, bilateral BKA, and recently diagnosed pancreatic cancer who presents with 2 days SOB.  Pharmacy consulted to dose warfarin for AFib..  Last dose warfarin noted 5/10. Home dose 10mg  daily and 15mg  on Wednesdays  01/03/2017 INR 1.95 H/H low Plts WNL  Goal of Therapy:  INR 2-3 Monitor platelets by anticoagulation protocol: Yes   Plan:  Warfarin 10mg  po x 1 Daily INR   Dolly Rias RPh 01/03/2017, 8:22 AM Pager [phone]

## 2017-01-03 NOTE — Progress Notes (Signed)
ANTICOAGULATION CONSULT NOTE - Initial Consult  Pharmacy Consult for warfarin Indication: atrial fibrillation  No Known Allergies  Patient Measurements: Height: 5\' 9"  (175.3 cm) Weight: 201 lb 8 oz (91.4 kg) IBW/kg (Calculated) : 70.7 Heparin Dosing Weight:   Vital Signs: Temp: 98 F (36.7 C) (05/12 0255) Temp Source: Oral (05/12 0255) BP: 111/70 (05/12 0255) Pulse Rate: 84 (05/12 0255)  Labs:  Recent Labs  01/02/17 2237 01/03/17 0042  HGB 9.0*  --   HCT 26.6*  --   PLT 340  --   LABPROT 24.1*  --   INR 2.12  --   CREATININE  --  1.20    Estimated Creatinine Clearance: 61.3 mL/min (by C-G formula based on SCr of 1.2 mg/dL).   Medical History: Past Medical History:  Diagnosis Date  . Anemia    takes Iron pill daily  . Arthritis   . Automatic implantable cardioverter-defibrillator in situ    a. 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  . CAD (coronary artery disease)   . Cardiomyopathy, nonischemic ([place])    a. Cath 2003: mild nonobstructive CAD, EF 25% at that time.  . Chronic systolic CHF (congestive heart failure) (HCC)    takes Furosemide daily as well as Aldactone  . CKD (chronic kidney disease), stage III   . Complication of anesthesia   . Constipation    takes Sennokot daily  . Dementia   . Gangrenous toe, Lt toe 09/21/2013  . GERD (gastroesophageal reflux disease)    takes Nexium daily  . Headache    occasionally  . High cholesterol    takes Atorvastatin daily  . History of blood transfusion    no abnormal reaction noted  . History of bronchitis    > 1 yr ago  . History of kidney stones   . Hypertension    takes Coreg daily  . LBBB (left bundle branch block)    takes Coumadin daily  . NSVT (nonsustained ventricular tachycardia) ([place])    a. Noted on ICD interrogation in 2011.  [name] PAD (peripheral artery disease) ([place])    a. 08/2013 Periph Angio/PTA: Abd Ao nl, RLE- 3v runoff, PT diff dzs, AT 90p, LLE 2v runoff, PT  100, AT 70m (diamondback ORA/chocolate balloon PTA).  [name] PAF (paroxysmal atrial fibrillation) ([place])    a. Noted on ICD interrogation 2012;  b. coumadin d/c'd 01/2013.  [name] PAF (paroxysmal atrial fibrillation) ([place])   . Peripheral neuropathy    hands;numbness and tingling   . Pneumonia    hx of > 71yr ago  . PONV (postoperative nausea and vomiting)   . Presence of permanent cardiac pacemaker   . Type II diabetes mellitus ([place])    takes Levemir daily as well as Novolog  . Urinary frequency   . Urinary urgency     Medications:  Prescriptions Prior to Admission  Medication Sig Dispense Refill Last Dose  . acetaminophen (TYLENOL) 325 MG tablet Take 2 tablets (650 mg total) by mouth every 6 (six) hours as needed for mild pain (or Fever >/= 101).   01/01/2017 at 2100  . alum & mag hydroxide-simeth (MAALOX/MYLANTA) 200-200-20 MG/5ML suspension Take 15 mLs by mouth every 4 (four) hours as needed for indigestion or heartburn. 355 mL 0 Past Week at Unknown time  . atorvastatin (LIPITOR) 10 MG tablet Take 1 tablet (10 mg total) by mouth daily. 30 tablet 1 01/02/2017 at Unknown time  . carvedilol (COREG) 6.25 MG tablet Take 1 tablet (6.25 mg  total) by mouth 2 (two) times daily with a meal. 60 tablet 0 01/02/2017 at 0930  . cholecalciferol (VITAMIN D) 1000 units tablet Take 1,000 Units by mouth daily.   01/02/2017 at Unknown time  . furosemide (LASIX) 40 MG tablet Take 1 tablet (40 mg total) by mouth daily. 30 tablet 1 01/02/2017 at Unknown time  . insulin detemir (LEVEMIR) 100 UNIT/ML injection Inject 0.15 mLs (15 Units total) into the skin 2 (two) times daily. 10 mL 0 01/02/2017 at Unknown time  . Insulin Glargine (TOUJEO SOLOSTAR) 300 UNIT/ML SOPN Inject 15 Units into the skin daily.   01/02/2017 at Unknown time  . iron polysaccharides (NIFEREX) 150 MG capsule Take 1 capsule (150 mg total) by mouth daily. 30 capsule 1 01/02/2017 at Unknown time  . Multiple Vitamin (MULTIVITAMIN WITH MINERALS) TABS tablet Take 1  tablet by mouth daily.   01/02/2017 at Unknown time  . omeprazole (PRILOSEC) 20 MG capsule Take 20 mg by mouth daily.   01/02/2017 at Unknown time  . pregabalin (LYRICA) 75 MG capsule Take 1 capsule (75 mg total) by mouth 2 (two) times daily. 60 capsule 1 01/02/2017 at Unknown time  . protein supplement shake (PREMIER PROTEIN) LIQD Take 325 mLs (11 oz total) by mouth 2 (two) times daily between meals. 60 Can 0 01/02/2017 at Unknown time  . saccharomyces boulardii (FLORASTOR) 250 MG capsule Take 1 capsule (250 mg total) by mouth 2 (two) times daily. 14 capsule 0 01/02/2017 at Unknown time  . sacubitril-valsartan (ENTRESTO) 24-26 MG Take 1 tablet by mouth 2 (two) times daily. 60 tablet 6 01/02/2017 at Unknown time  . spironolactone (ALDACTONE) 25 MG tablet Take 1 tablet (25 mg total) by mouth daily. 30 tablet 3 01/02/2017 at Unknown time  . vancomycin (VANCOCIN) 125 MG capsule Take 125 mg by mouth 4 (four) times daily.   01/02/2017 at Unknown time  . warfarin (COUMADIN) 10 MG tablet Take 10-15 mg by mouth every evening. Pt takes one and one-half tablet on Wednesday and one tablet all other days.   01/01/2017 at 1800   Scheduled:  . atorvastatin  10 mg Oral Daily  . carvedilol  6.25 mg Oral BID WC  . furosemide  40 mg Intravenous BID  . insulin aspart  0-15 Units Subcutaneous TID WC  . insulin aspart  0-5 Units Subcutaneous QHS  . insulin detemir  15 Units Subcutaneous BID  . iron polysaccharides  150 mg Oral Daily  . potassium chloride  20 mEq Oral BID  . pregabalin  75 mg Oral BID  . protein supplement shake  11 oz Oral BID BM  . saccharomyces boulardii  250 mg Oral BID  . sacubitril-valsartan  1 tablet Oral BID  . spironolactone  25 mg Oral Daily  . vancomycin  125 mg Oral QID    Assessment: Patient on chronic warfarin for afib.  INR 2.12 on admit.  Last dose warfarin noted 5/10.  Goal of Therapy:  INR 2-3 Monitor platelets by anticoagulation protocol: Yes   Plan:  Daily INR Dose warfarin  as needed  Nani Skillern Crowford 01/03/2017,4:14 AM

## 2017-01-03 NOTE — Progress Notes (Signed)
Agree with HPI as per Dr. Loleta Books  States SOB but no swelling and exertional dyspnea He has not had any othe rfever spoke He has a paced rythmn on monitors and if stable can d/c monito in am Unclear home regimen for insulin-suagrs now 250 range.  Cont 15 bid U lantus and SSI and adjust if still higher in am Finishing vancomycin today for diarrea

## 2017-01-04 LAB — BASIC METABOLIC PANEL
ANION GAP: 9 (ref 5–15)
BUN: 21 mg/dL — AB (ref 6–20)
CHLORIDE: 106 mmol/L (ref 101–111)
CO2: 26 mmol/L (ref 22–32)
Calcium: 8.5 mg/dL — ABNORMAL LOW (ref 8.9–10.3)
Creatinine, Ser: 1.12 mg/dL (ref 0.61–1.24)
GFR calc Af Amer: >60 mL/min (ref >60)
GLUCOSE: 63 mg/dL — AB (ref 65–99)
Potassium: 4.1 mmol/L (ref 3.5–5.1)
Sodium: 141 mmol/L (ref 135–145)

## 2017-01-04 LAB — PROTIME-INR
INR: 1.87
PROTHROMBIN TIME: 21.8 s — AB (ref 11.4–15.2)

## 2017-01-04 LAB — GLUCOSE, CAPILLARY
GLUCOSE-CAPILLARY: 213 mg/dL — AB (ref 65–99)
Glucose-Capillary: 84 mg/dL (ref 65–99)
Glucose-Capillary: 240 mg/dL — ABNORMAL HIGH (ref 65–99)

## 2017-01-04 LAB — HEMOGLOBIN A1C
Hgb A1c MFr Bld: 10.5 % — ABNORMAL HIGH (ref 4.8–5.6)
MEAN PLASMA GLUCOSE: 255 mg/dL

## 2017-01-04 MED ORDER — INSULIN GLARGINE 100 UNIT/ML ~~LOC~~ SOLN
10.0000 [IU] | Freq: Every day | SUBCUTANEOUS | Status: DC
  Administered 2017-01-05 – 2017-01-06 (×3): 10 [IU] via SUBCUTANEOUS
  Filled 2017-01-04 (×4): qty 0.1

## 2017-01-04 MED ORDER — WARFARIN SODIUM 5 MG PO TABS
10.0000 mg | ORAL_TABLET | Freq: Once | ORAL | Status: AC
  Administered 2017-01-04: 10 mg via ORAL
  Filled 2017-01-04: qty 2

## 2017-01-04 MED ORDER — INSULIN DETEMIR 100 UNIT/ML ~~LOC~~ SOLN
15.0000 [IU] | Freq: Every day | SUBCUTANEOUS | Status: DC
  Administered 2017-01-04 – 2017-01-05 (×2): 15 [IU] via SUBCUTANEOUS
  Filled 2017-01-04 (×3): qty 0.15

## 2017-01-04 NOTE — Progress Notes (Signed)
Initial Nutrition Assessment  DOCUMENTATION CODES:   Non-severe (moderate) malnutrition in context of chronic illness, Obesity unspecified  INTERVENTION:   Continue Ensure Enlive po BID, each supplement provides 350 kcal and 20 grams of protein Continue Premier Protein BID, each supplement provides 160kcal and 30g protein.  RD to continue to monitor  NUTRITION DIAGNOSIS:   Malnutrition (Moderate) related to chronic illness, cancer and cancer related treatments as evidenced by percent weight loss, mild depletion of muscle mass.  GOAL:   Patient will meet greater than or equal to 90% of their needs  MONITOR:   PO intake, Supplement acceptance, Labs, Weight trends, I & O's  REASON FOR ASSESSMENT:   Malnutrition Screening Tool    ASSESSMENT:   71 y.o. male with a past medical history significant for NICM EF 20%, BiV and ICD, IDDM, pAF on warfarin, bilateral BKA, and recently diagnosed pancreatic cancer who presents with 2 days SOB.  Patient in room with no family at bedside. Pt reports good appetite and feeling good. Pt states he is drinking the supplements provided as long as they are chocolate flavored. Pt currently consuming 80-100% of meals. Reviewed protein foods with patient.   Per chart review, pt has lost 11 lb since 4/16 (5% wt loss x 1 month, significant for time frame).  Nutrition-Focused physical exam completed. Findings are no fat depletion, mild muscle depletion, and no edema.   Medications: IV Lasix daily, K-DUR tablet BID, Florastor capsule BID  Labs reviewed: CBGs: 62-376  Diet Order:  Diet Heart Room service appropriate? Yes; Fluid consistency: Thin  Skin:  Reviewed, no issues  Last BM:  5/10  Height:   Ht Readings from Last 1 Encounters:  01/03/17 5\' 9"  (1.753 m)    Weight:   Wt Readings from Last 1 Encounters:  01/04/17 199 lb 15.3 oz (90.7 kg)    Ideal Body Weight:  64 kg (adjusted for bilateral BKAs)  BMI:  Body mass index is 29.53  kg/m.  Estimated Nutritional Needs:   Kcal:  1900-2100  Protein:  80-90g  Fluid:  2L/day  EDUCATION NEEDS:   Education needs addressed  Clayton Bibles, MS, RD, LDN Pager: [phone] After Hours Pager: [phone]

## 2017-01-04 NOTE — Progress Notes (Signed)
ANTICOAGULATION CONSULT NOTE -   Pharmacy Consult for warfarin Indication: atrial fibrillation  No Known Allergies  Patient Measurements: Height: 5\' 9"  (175.3 cm) Weight: 199 lb 15.3 oz (90.7 kg) IBW/kg (Calculated) : 70.7 Heparin Dosing Weight:   Vital Signs: Temp: 97.8 F (36.6 C) (05/13 0549) Temp Source: Oral (05/13 0549) BP: 117/58 (05/13 0549) Pulse Rate: 86 (05/13 0549)  Labs:  Recent Labs  01/02/17 2237 01/03/17 0042 01/03/17 0518 01/04/17 0547  HGB 9.0*  --  8.2*  --   HCT 26.6*  --  25.4*  --   PLT 340  --  305  --   LABPROT 24.1*  --  22.6* 21.8*  INR 2.12  --  1.95 1.87  CREATININE  --  1.20  --  1.12    Estimated Creatinine Clearance: 65.4 mL/min (by C-G formula based on SCr of 1.12 mg/dL).   Medical History: Past Medical History:  Diagnosis Date  . Anemia    takes Iron pill daily  . Arthritis   . Automatic implantable cardioverter-defibrillator in situ    a. 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  . CAD (coronary artery disease)   . Cardiomyopathy, nonischemic ([place])    a. Cath 2003: mild nonobstructive CAD, EF 25% at that time.  . Chronic systolic CHF (congestive heart failure) (HCC)    takes Furosemide daily as well as Aldactone  . CKD (chronic kidney disease), stage III   . Complication of anesthesia   . Constipation    takes Sennokot daily  . Dementia   . Gangrenous toe, Lt toe 09/21/2013  . GERD (gastroesophageal reflux disease)    takes Nexium daily  . Headache    occasionally  . High cholesterol    takes Atorvastatin daily  . History of blood transfusion    no abnormal reaction noted  . History of bronchitis    > 1 yr ago  . History of kidney stones   . Hypertension    takes Coreg daily  . LBBB (left bundle branch block)    takes Coumadin daily  . NSVT (nonsustained ventricular tachycardia) ([place])    a. Noted on ICD interrogation in 2011.  [name] PAD (peripheral artery disease) ([place])    a. 08/2013  Periph Angio/PTA: Abd Ao nl, RLE- 3v runoff, PT diff dzs, AT 90p, LLE 2v runoff, PT 100, AT 50m (diamondback ORA/chocolate balloon PTA).  [name] PAF (paroxysmal atrial fibrillation) ([place])    a. Noted on ICD interrogation 2012;  b. coumadin d/c'd 01/2013.  [name] PAF (paroxysmal atrial fibrillation) ([place])   . Peripheral neuropathy    hands;numbness and tingling   . Pneumonia    hx of > 56yr ago  . PONV (postoperative nausea and vomiting)   . Presence of permanent cardiac pacemaker   . Type II diabetes mellitus ([place])    takes Levemir daily as well as Novolog  . Urinary frequency   . Urinary urgency     Medications:  Prescriptions Prior to Admission  Medication Sig Dispense Refill Last Dose  . acetaminophen (TYLENOL) 325 MG tablet Take 2 tablets (650 mg total) by mouth every 6 (six) hours as needed for mild pain (or Fever >/= 101).   01/01/2017 at 2100  . alum & mag hydroxide-simeth (MAALOX/MYLANTA) 200-200-20 MG/5ML suspension Take 15 mLs by mouth every 4 (four) hours as needed for indigestion or heartburn. 355 mL 0 Past Week at Unknown time  . atorvastatin (LIPITOR) 10 MG tablet Take 1 tablet (10 mg  total) by mouth daily. 30 tablet 1 01/02/2017 at Unknown time  . carvedilol (COREG) 6.25 MG tablet Take 1 tablet (6.25 mg total) by mouth 2 (two) times daily with a meal. 60 tablet 0 01/02/2017 at 0930  . cholecalciferol (VITAMIN D) 1000 units tablet Take 1,000 Units by mouth daily.   01/02/2017 at Unknown time  . furosemide (LASIX) 40 MG tablet Take 1 tablet (40 mg total) by mouth daily. 30 tablet 1 01/02/2017 at Unknown time  . insulin detemir (LEVEMIR) 100 UNIT/ML injection Inject 0.15 mLs (15 Units total) into the skin 2 (two) times daily. 10 mL 0 01/02/2017 at Unknown time  . Insulin Glargine (TOUJEO SOLOSTAR) 300 UNIT/ML SOPN Inject 15 Units into the skin daily.   01/02/2017 at Unknown time  . iron polysaccharides (NIFEREX) 150 MG capsule Take 1 capsule (150 mg total) by mouth daily. 30 capsule 1 01/02/2017  at Unknown time  . Multiple Vitamin (MULTIVITAMIN WITH MINERALS) TABS tablet Take 1 tablet by mouth daily.   01/02/2017 at Unknown time  . omeprazole (PRILOSEC) 20 MG capsule Take 20 mg by mouth daily.   01/02/2017 at Unknown time  . pregabalin (LYRICA) 75 MG capsule Take 1 capsule (75 mg total) by mouth 2 (two) times daily. 60 capsule 1 01/02/2017 at Unknown time  . protein supplement shake (PREMIER PROTEIN) LIQD Take 325 mLs (11 oz total) by mouth 2 (two) times daily between meals. 60 Can 0 01/02/2017 at Unknown time  . saccharomyces boulardii (FLORASTOR) 250 MG capsule Take 1 capsule (250 mg total) by mouth 2 (two) times daily. 14 capsule 0 01/02/2017 at Unknown time  . sacubitril-valsartan (ENTRESTO) 24-26 MG Take 1 tablet by mouth 2 (two) times daily. 60 tablet 6 01/02/2017 at Unknown time  . spironolactone (ALDACTONE) 25 MG tablet Take 1 tablet (25 mg total) by mouth daily. 30 tablet 3 01/02/2017 at Unknown time  . [EXPIRED] vancomycin (VANCOCIN) 125 MG capsule Take 125 mg by mouth 4 (four) times daily.   01/02/2017 at Unknown time  . warfarin (COUMADIN) 10 MG tablet Take 10-15 mg by mouth every evening. Pt takes one and one-half tablet on Wednesday and one tablet all other days.   01/01/2017 at 1800   Scheduled:  . atorvastatin  10 mg Oral Daily  . carvedilol  6.25 mg Oral BID WC  . feeding supplement (ENSURE ENLIVE)  237 mL Oral BID BM  . furosemide  40 mg Intravenous Daily  . insulin aspart  0-15 Units Subcutaneous TID WC  . insulin aspart  0-5 Units Subcutaneous QHS  . insulin detemir  15 Units Subcutaneous BID  . iron polysaccharides  150 mg Oral Daily  . potassium chloride  20 mEq Oral BID  . pregabalin  75 mg Oral BID  . protein supplement shake  11 oz Oral BID BM  . saccharomyces boulardii  250 mg Oral BID  . sacubitril-valsartan  1 tablet Oral BID  . spironolactone  25 mg Oral Daily  . Warfarin - Pharmacist Dosing Inpatient   Does not apply q1800    Assessment: 71 yo male with  past medical history significant for NICM EF 20%, BiV and ICD, IDDM, pAF on warfarin, bilateral BKA, and recently diagnosed pancreatic cancer who presents with 2 days SOB.  Pharmacy consulted to dose warfarin for AFib..  Last dose warfarin noted 5/10. Home dose 10mg  daily and 15mg  on Wednesdays  01/04/2017 INR 1.87 H/H low Plts WNL DI: none  Goal of Therapy:  INR 2-3 Monitor  platelets by anticoagulation protocol: Yes   Plan:  Warfarin 10mg  po x 1 Daily INR   Dolly Rias RPh 01/04/2017, 8:04 AM Pager [phone]

## 2017-01-04 NOTE — Discharge Summary (Signed)
PROGRESS NOTE    ARY LAVINE  PIR:[id number] DOB: [birth year] DOA: 01/02/2017 PCP: Josetta Huddle, MD  Outpatient Specialists:     Brief Narrative:   39  NICM ef 20% PAfib AICD -complications of slow VT bilat BKA from complications of DM-  Osteo R Toe BKA 01/30/14 after attempt at [facility] amputations Dr. 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  ICD in '05, BiV ICD 11/10   PAF (paroxysmal atrial fibrillation) (HCC)   Type 2 diabetes mellitus with complication, with long-term current use of insulin (HCC)   Anemia of chronic disease   Pancreatic adenocarcinoma (HCC)  Fever-not PNA Probably low grade form Cdiff.  No recurrence=-no diarr  ? AECHF NICM Diuresing lasix 40 daily--feeling better Cont entresto 1 bid, aldactone 25 od, Coreg 6.25 bid  Chr cholecystotomy Poor candidate for any type of surgery-OP drain clinic appt to be kept. No sepsis so no abx needed continue drain flushes  Recent Cdiff Completed therapy 01/03/17  bilat BKA for osteo-stable-ambulatory with false legs  PAfib/Slow VT with AICD  Coreg 6.25 as above Tele benign and d/c 5/13 Pharm do to dose coumadin-currently SubTx  uncontrolled DM with some hypoglycemia cbg 63 01/04/17 am Cut lantus in pm 15-->10  Reassess control again in am  HLD Hold statin  Pancreatic Cancer incidentally found recently Not a candidate for chemo given ECOG Tumour board meeting to d/w patient on  5/15 and will inform him  Adult FTT might require hopsice discussions soon?  Microcytic anemia probably from coumadin and chr slow blood loss stable    Consultants:   none  Procedures:   none  Antimicrobials:   compelted vanc 5/12    Subjective:  Alert pleasan tin nad Feels overall improved No cp No diarr No chills no sob  Objective: Vitals:   01/03/17 1524 01/03/17 2000 01/04/17 0549 01/04/17 0550  BP: 114/63 115/66 (!) 117/58   Pulse: 73 86 86   Resp: 18 18 18    Temp: 97.7 F (36.5 C) 97.8 F (36.6 C) 97.8 F (36.6 C)   TempSrc: Oral Oral Oral   SpO2: 95% 100% 100%   Weight:    90.7 kg (199 lb 15.3 oz)  Height:        Intake/Output Summary (Last 24 hours) at 01/04/17 1044 Last data filed at 01/04/17 0552  Gross per 24 hour  Intake                0 ml  Output             1350 ml  Net            -1350 ml   Filed Weights   01/02/17 2020 01/03/17 0255 01/04/17 0550  Weight: 88.5 kg (195 lb) 91.4 kg (201 lb 8 oz) 90.7 kg (199 lb 15.3 oz)    Examination: Alert pleasant Neck soft supple Chest clinically clear no added sound no heaves NSR, S1-S2, RRR and paced rythmn Neurologically intact moves 4  limbs equally     Data Reviewed: I have personally reviewed following labs and imaging studies  CBC:  Recent Labs Lab 01/02/17 2237 01/03/17 0518  WBC 9.4 6.4  NEUTROABS 8.3*  --   HGB 9.0* 8.2*  HCT 26.6* 25.4*  MCV 73.5* 73.8*  PLT 340 867   Basic Metabolic Panel:  Recent Labs Lab 01/03/17 0042 01/04/17 0547  NA 137 141  K 4.6 4.1  CL 106 106  CO2 24 26  GLUCOSE 317* 63*  BUN 24* 21*  CREATININE 1.20 1.12  CALCIUM 8.3* 8.5*   GFR: Estimated Creatinine Clearance: 65.4 mL/min (by C-G formula based on SCr of 1.12 mg/dL). Liver Function Tests:  Recent Labs Lab 01/03/17 0042  AST 16  ALT 19  ALKPHOS 131*  BILITOT 0.4  PROT 6.6  ALBUMIN 2.6*    Recent Labs Lab 01/03/17 0042  LIPASE 16   No results for input(s):  AMMONIA in the last 168 hours. Coagulation Profile:  Recent Labs Lab 12/31/16 01/02/17 2237 01/03/17 0518 01/04/17 0547  INR 1.6* 2.12 1.95 1.87   Cardiac Enzymes: No results for input(s): CKTOTAL, CKMB, CKMBINDEX, TROPONINI in the last 168 hours. BNP (last 3 results) No results for input(s): PROBNP in the last 8760 hours. HbA1C:  Recent Labs  01/03/17 0518  HGBA1C 10.5*   CBG:  Recent Labs Lab 01/03/17 0821 01/03/17 1148 01/03/17 1700 01/03/17 2136  GLUCAP 236* 252* 147* 155*   Lipid Profile: No results for input(s): CHOL, HDL, LDLCALC, TRIG, CHOLHDL, LDLDIRECT in the last 72 hours. Thyroid Function Tests: No results for input(s): TSH, T4TOTAL, FREET4, T3FREE, THYROIDAB in the last 72 hours. Anemia Panel: No results for input(s): VITAMINB12, FOLATE, FERRITIN, TIBC, IRON, RETICCTPCT in the last 72 hours. Urine analysis:    Component Value Date/Time   COLORURINE AMBER (A) 12/08/2016 1131   APPEARANCEUR TURBID (A) 12/08/2016 1131   LABSPEC 1.015 12/08/2016 1131   PHURINE 5.0 12/08/2016 1131   GLUCOSEU 150 (A) 12/08/2016 1131   HGBUR MODERATE (A) 12/08/2016 1131   BILIRUBINUR NEGATIVE 12/08/2016 1131   KETONESUR 5 (A) 12/08/2016 1131   PROTEINUR 100 (A) 12/08/2016 1131   UROBILINOGEN 0.2 01/11/2015 1850   NITRITE NEGATIVE 12/08/2016 1131   LEUKOCYTESUR LARGE (A) 12/08/2016 1131   Sepsis Labs: @LABRCNTIP (procalcitonin:4,lacticidven:4)  )No results found for this or any previous visit (from the past 240 hour(s)).   Radiology Studies: Dg Chest 2 View  Result Date: 01/02/2017 CLINICAL DATA:  Acute onset of shortness of breath and generalized weakness. Initial encounter. EXAM: CHEST  2 VIEW COMPARISON:  Chest radiograph performed 12/08/2016 FINDINGS: The lungs are well-aerated. Vascular congestion is noted. Increased interstitial markings raise concern for mild interstitial edema. Small bilateral pleural effusions are seen. No pneumothorax is identified. The heart  is borderline enlarged. A pacemaker/AICD is noted at the left chest wall, with leads ending at the right atrium, right ventricle and coronary sinus. No acute osseous abnormalities are seen. IMPRESSION: Vascular congestion and borderline cardiomegaly. Increased interstitial markings raise concern for mild interstitial edema. Small bilateral pleural effusions seen. Electronically Signed   By: Garald Balding M.D.   On: 01/02/2017 22:26   Scheduled Meds: . atorvastatin  10 mg Oral Daily  . carvedilol  6.25 mg Oral BID WC  . feeding supplement (ENSURE ENLIVE)  237 mL Oral BID BM  . furosemide  40 mg Intravenous Daily  . insulin aspart  0-15 Units Subcutaneous TID WC  . insulin aspart  0-5 Units Subcutaneous QHS  .  insulin detemir  15 Units Subcutaneous Daily  . insulin glargine  10 Units Subcutaneous QHS  . iron polysaccharides  150 mg Oral Daily  . potassium chloride  20 mEq Oral BID  . pregabalin  75 mg Oral BID  . protein supplement shake  11 oz Oral BID BM  . saccharomyces boulardii  250 mg Oral BID  . sacubitril-valsartan  1 tablet Oral BID  . spironolactone  25 mg Oral Daily  . warfarin  10 mg Oral ONCE-1800  . Warfarin - Pharmacist Dosing Inpatient   Does not apply q1800   Continuous Infusions:   LOS: 1 day    Time spent: [place], MD [facility] ([facility]   If 7PM-7AM, please contact night-coverage [email or web address] Password [facility] 01/04/2017, 10:44 AM

## 2017-01-04 NOTE — Progress Notes (Signed)
Dressing changed around drain, old drain had yellow purulent drainage. Patient tolerated well.  Barbee Shro[place]. Brigitte Pulse, RN

## 2017-01-05 ENCOUNTER — Other Ambulatory Visit: Payer: Self-pay | Admitting: *Deleted

## 2017-01-05 LAB — COMPREHENSIVE METABOLIC PANEL
ALBUMIN: 2.5 g/dL — AB (ref 3.5–5.0)
ALK PHOS: 169 U/L — AB (ref 38–126)
ALT: 32 U/L (ref 17–63)
ANION GAP: 10 (ref 5–15)
AST: 58 U/L — ABNORMAL HIGH (ref 15–41)
BUN: 23 mg/dL — ABNORMAL HIGH (ref 6–20)
CALCIUM: 8.6 mg/dL — AB (ref 8.9–10.3)
CO2: 28 mmol/L (ref 22–32)
Chloride: 99 mmol/L — ABNORMAL LOW (ref 101–111)
Creatinine, Ser: 1.16 mg/dL (ref 0.61–1.24)
GFR calc Af Amer: >60 mL/min (ref >60)
GFR calc non Af Amer: >60 mL/min (ref >60)
GLUCOSE: 248 mg/dL — AB (ref 65–99)
Potassium: 4.6 mmol/L (ref 3.5–5.1)
Sodium: 137 mmol/L (ref 135–145)
Total Bilirubin: 0.5 mg/dL (ref 0.3–1.2)
Total Protein: 6.6 g/dL (ref 6.5–8.1)

## 2017-01-05 LAB — CBC WITH DIFFERENTIAL/PLATELET
BASOS PCT: 0 %
Basophils Absolute: 0 10*3/uL (ref 0.0–0.1)
Eosinophils Absolute: 0.2 10*3/uL (ref 0.0–0.7)
Eosinophils Relative: 2 %
HEMATOCRIT: 29.8 % — AB (ref 39.0–52.0)
Hemoglobin: 9.5 g/dL — ABNORMAL LOW (ref 13.0–17.0)
LYMPHS ABS: 0.5 10*3/uL — AB (ref 0.7–4.0)
LYMPHS PCT: 8 %
MCH: 23.8 pg — AB (ref 26.0–34.0)
MCHC: 31.9 g/dL (ref 30.0–36.0)
MCV: 74.5 fL — AB (ref 78.0–100.0)
MONO ABS: 0.6 10*3/uL (ref 0.1–1.0)
MONOS PCT: 9 %
NEUTROS ABS: 5.3 10*3/uL (ref 1.7–7.7)
Neutrophils Relative %: 81 %
Platelets: 327 10*3/uL (ref 150–400)
RBC: 4 MIL/uL — ABNORMAL LOW (ref 4.22–5.81)
RDW: 20 % — AB (ref 11.5–15.5)
WBC: 6.6 10*3/uL (ref 4.0–10.5)

## 2017-01-05 LAB — PROTIME-INR
INR: 2.04
Prothrombin Time: 23.4 seconds — ABNORMAL HIGH (ref 11.4–15.2)

## 2017-01-05 LAB — GLUCOSE, CAPILLARY
GLUCOSE-CAPILLARY: 142 mg/dL — AB (ref 65–99)
Glucose-Capillary: 165 mg/dL — ABNORMAL HIGH (ref 65–99)
Glucose-Capillary: 181 mg/dL — ABNORMAL HIGH (ref 65–99)
Glucose-Capillary: 202 mg/dL — ABNORMAL HIGH (ref 65–99)

## 2017-01-05 MED ORDER — FUROSEMIDE 40 MG PO TABS
40.0000 mg | ORAL_TABLET | Freq: Every day | ORAL | Status: DC
  Administered 2017-01-05 – 2017-01-07 (×3): 40 mg via ORAL
  Filled 2017-01-05 (×3): qty 1

## 2017-01-05 MED ORDER — WARFARIN SODIUM 10 MG PO TABS: ORAL_TABLET | ORAL | 0 refills | Status: DC

## 2017-01-05 MED ORDER — WARFARIN SODIUM 5 MG PO TABS
10.0000 mg | ORAL_TABLET | Freq: Once | ORAL | Status: AC
  Administered 2017-01-05: 10 mg via ORAL
  Filled 2017-01-05: qty 2

## 2017-01-05 NOTE — Discharge Summary (Signed)
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  ICD in '05, BiV ICD 11/10   PAF (paroxysmal atrial fibrillation) (HCC)   Type 2 diabetes mellitus with complication, with long-term current use of insulin (HCC)   Anemia of chronic disease   Pancreatic adenocarcinoma (HCC)  Fever-not PNA Probably low grade form Cdiff.  No recurrence= no diarr Monitor if spikes above 100.5 For blood and drain culture if tmax above 101  ? AECHF NICM Diuresing lasix 40 daily--feeling better-change dot PO 5/14 Cont entresto 1 bid, aldactone 25 od, Coreg 6.25 bid I/o -3 liters Weight not best assesement of fluid status-has dropped fropm [phone] in 3 weeks--has cancer as well  Chr cholecystotomy Poor candidate for any type of surgery-OP drain clinic appt to be kept. No sepsis so no abx needed currently continue drain flushes  Recent Cdiff Completed therapy 01/03/17  bilat BKA for osteo-stable-ambulatory with false legs  PAfib/Slow VT with AICD  Coreg 6.25 as above Tele benign and d/c 5/13 Pharm do to dose coumadin-currently 2.04  uncontrolled DM with some  hypoglycemia cbg 63 01/04/17 am Cut lantus in pm 15-->10  Home meds include Toujeo pens 15U from Md office Will need also tid insulin if eating more food Appreciate DM coordinator input and assistance  HLD Hold statin  Pancreatic Cancer incidentally found recently Not a candidate for chemo given ECOG Tumour board meeting to d/w patient on 5/15 and will inform him  Adult FTT might require hopsice discussions soon?  Microcytic anemia probably from coumadin and chr slow blood loss stable    Consultants:   none  Procedures:   none  Antimicrobials:   compelted vanc 5/12    Subjective:  Fair no sob Nursing informs of low grade temps nmo new othe rissue Seems stable  Objective: Vitals:   01/04/17 2226 01/05/17 0500 01/05/17 0713 01/05/17 1300  BP: 109/63 105/70  (!) 119/92  Pulse: 87 90  (!) 110  Resp: 18 16  15   Temp: 98.1 F (36.7 C) 98.7 F (37.1 C)  100.2 F (37.9 C)  TempSrc: Oral Oral  Oral  SpO2: 98% 94%  93%  Weight:  89.3 kg (196 lb 13.9 oz) 88.1 kg (194 lb 3.6 oz)   Height:        Intake/Output Summary (Last 24 hours) at 01/05/17 1404 Last data filed at 01/05/17 1338  Gross per 24 hour  Intake              200 ml  Output             1450 ml  Net            -  1250 ml   Filed Weights   01/04/17 0550 01/05/17 0500 01/05/17 0713  Weight: 90.7 kg (199 lb 15.3 oz) 89.3 kg (196 lb 13.9 oz) 88.1 kg (194 lb 3.6 oz)    Examination:  Alert pleasant-neck soft supple, no jvd Chest clinically clear no added sound no heaves NSR, S1-S2, RRR and paced rythmn Neurologically intact moves 4 limbs equally Stumps bilaterally clean      Data Reviewed: I have personally reviewed following labs and imaging studies  CBC:  Recent Labs Lab 01/02/17 2237 01/03/17 0518 01/05/17 0453  WBC 9.4 6.4 6.6  NEUTROABS 8.3*  --  5.3  HGB 9.0* 8.2* 9.5*  HCT 26.6* 25.4* 29.8*  MCV 73.5* 73.8* 74.5*  PLT 340 305 185   Basic Metabolic Panel:  Recent Labs Lab  01/03/17 0042 01/04/17 0547 01/05/17 0453  NA 137 141 137  K 4.6 4.1 4.6  CL 106 106 99*  CO2 24 26 28   GLUCOSE 317* 63* 248*  BUN 24* 21* 23*  CREATININE 1.20 1.12 1.16  CALCIUM 8.3* 8.5* 8.6*   GFR: Estimated Creatinine Clearance: 62.3 mL/min (by C-G formula based on SCr of 1.16 mg/dL). Liver Function Tests:  Recent Labs Lab 01/03/17 0042 01/05/17 0453  AST 16 58*  ALT 19 32  ALKPHOS 131* 169*  BILITOT 0.4 0.5  PROT 6.6 6.6  ALBUMIN 2.6* 2.5*    Recent Labs Lab 01/03/17 0042  LIPASE 16   No results for input(s): AMMONIA in the last 168 hours. Coagulation Profile:  Recent Labs Lab 12/31/16 01/02/17 2237 01/03/17 0518 01/04/17 0547 01/05/17 0453  INR 1.6* 2.12 1.95 1.87 2.04   Cardiac Enzymes: No results for input(s): CKTOTAL, CKMB, CKMBINDEX, TROPONINI in the last 168 hours. BNP (last 3 results) No results for input(s): PROBNP in the last 8760 hours. HbA1C:  Recent Labs  01/03/17 0518  HGBA1C 10.5*   CBG:  Recent Labs Lab 01/04/17 0812 01/04/17 1143 01/04/17 1712 01/05/17 0837 01/05/17 1348  GLUCAP 84 213* 240* 165* 181*   Lipid Profile: No results for input(s): CHOL, HDL, LDLCALC, TRIG, CHOLHDL, LDLDIRECT in the last 72 hours. Thyroid Function Tests: No results for input(s): TSH, T4TOTAL, FREET4, T3FREE, THYROIDAB in the last 72 hours. Anemia Panel: No results for input(s): VITAMINB12, FOLATE, FERRITIN, TIBC, IRON, RETICCTPCT in the last 72 hours. Urine analysis:    Component Value Date/Time   COLORURINE AMBER (A) 12/08/2016 1131   APPEARANCEUR TURBID (A) 12/08/2016 1131   LABSPEC 1.015 12/08/2016 1131   PHURINE 5.0 12/08/2016 1131   GLUCOSEU 150 (A) 12/08/2016 1131   HGBUR MODERATE (A) 12/08/2016 1131   BILIRUBINUR NEGATIVE 12/08/2016 1131   KETONESUR 5 (A) 12/08/2016 1131   PROTEINUR 100 (A) 12/08/2016 1131   UROBILINOGEN 0.2 01/11/2015 1850   NITRITE NEGATIVE 12/08/2016 1131   LEUKOCYTESUR LARGE (A) 12/08/2016 1131   Sepsis  Labs: @LABRCNTIP (procalcitonin:4,lacticidven:4)  )No results found for this or any previous visit (from the past 240 hour(s)).   Radiology Studies: No results found. Scheduled Meds: . atorvastatin  10 mg Oral Daily  . carvedilol  6.25 mg Oral BID WC  . feeding supplement (ENSURE ENLIVE)  237 mL Oral BID BM  . furosemide  40 mg Oral Daily  . insulin aspart  0-15 Units Subcutaneous TID WC  . insulin aspart  0-5 Units Subcutaneous QHS  . insulin detemir  15 Units Subcutaneous Daily  . insulin glargine  10 Units Subcutaneous QHS  . iron polysaccharides  150 mg Oral Daily  .  potassium chloride  20 mEq Oral BID  . pregabalin  75 mg Oral BID  . protein supplement shake  11 oz Oral BID BM  . saccharomyces boulardii  250 mg Oral BID  . sacubitril-valsartan  1 tablet Oral BID  . spironolactone  25 mg Oral Daily  . warfarin  10 mg Oral ONCE-1800  . Warfarin - Pharmacist Dosing Inpatient   Does not apply q1800   Continuous Infusions:   LOS: 2 days    Time spent: [place], MD [facility] ([facility]   If 7PM-7AM, please contact night-coverage [email or web address] Password [facility] 01/05/2017, 2:04 PM

## 2017-01-05 NOTE — Telephone Encounter (Signed)
Pharmacy requests a ninety day supply. 

## 2017-01-05 NOTE — Progress Notes (Signed)
Spoke with pt's wife concerning HH. Mrs Martelli states pt is active with [facility] with RN/NA/PT. Will continue with [facility] at discharge. [facility] [phone], fax [phone].

## 2017-01-05 NOTE — Progress Notes (Signed)
ANTICOAGULATION CONSULT NOTE - Follow up  Pharmacy Consult for warfarin Indication: atrial fibrillation  No Known Allergies  Patient Measurements: Height: 5\' 9"  (175.3 cm) Weight: 194 lb 3.6 oz (88.1 kg) IBW/kg (Calculated) : 70.7  Vital Signs: Temp: 98.7 F (37.1 C) (05/14 0500) Temp Source: Oral (05/14 0500) BP: 105/70 (05/14 0500) Pulse Rate: 90 (05/14 0500)  Labs:  Recent Labs  01/02/17 2237 01/03/17 0042 01/03/17 0518 01/04/17 0547 01/05/17 0453  HGB 9.0*  --  8.2*  --  9.5*  HCT 26.6*  --  25.4*  --  29.8*  PLT 340  --  305  --  327  LABPROT 24.1*  --  22.6* 21.8* 23.4*  INR 2.12  --  1.95 1.87 2.04  CREATININE  --  1.20  --  1.12 1.16    Estimated Creatinine Clearance: 62.3 mL/min (by C-G formula based on SCr of 1.16 mg/dL).  Medications: Scheduled:  . atorvastatin  10 mg Oral Daily  . carvedilol  6.25 mg Oral BID WC  . feeding supplement (ENSURE ENLIVE)  237 mL Oral BID BM  . furosemide  40 mg Intravenous Daily  . insulin aspart  0-15 Units Subcutaneous TID WC  . insulin aspart  0-5 Units Subcutaneous QHS  . insulin detemir  15 Units Subcutaneous Daily  . insulin glargine  10 Units Subcutaneous QHS  . iron polysaccharides  150 mg Oral Daily  . potassium chloride  20 mEq Oral BID  . pregabalin  75 mg Oral BID  . protein supplement shake  11 oz Oral BID BM  . saccharomyces boulardii  250 mg Oral BID  . sacubitril-valsartan  1 tablet Oral BID  . spironolactone  25 mg Oral Daily  . Warfarin - Pharmacist Dosing Inpatient   Does not apply q1800    Assessment: 71 yo [name] with past medical history significant for NICM EF 20%, BiV and ICD, IDDM, pAF on warfarin, bilateral BKA, and recently diagnosed pancreatic cancer w/ mets to liver who presents with 2 days SOB.  Pharmacy consulted to dose warfarin for AFib.   Home dose 10mg  daily and 15mg  on Wednesdays.  LD on 5/10. Admission INR 2.12  Today, 01/05/2017: INR 2.04, therapeutic Hgb low but improved to  9.5, Plt remain WNL No major drug-drug interactions noted. SCr 1.16 Metastatic liver disease:  Albumin 2.5, AST 58  Goal of Therapy:  INR 2-3 Monitor platelets by anticoagulation protocol: Yes   Plan:  Warfarin 10mg  PO x 1 at 1800 Daily INR  Gretta Arab PharmD, BCPS Pager [phone] 01/05/2017 7:25 AM

## 2017-01-05 NOTE — Progress Notes (Signed)
Inpatient Diabetes Program Recommendations  AACE/ADA: New Consensus Statement on Inpatient Glycemic Control (2015)  Target Ranges:  Prepandial:   less than 140 mg/dL      Peak postprandial:   less than 180 mg/dL (1-2 hours)      Critically ill patients:  140 - 180 mg/dL   Lab Results  Component Value Date   GLUCAP 165 (H) 01/05/2017   HGBA1C 10.5 (H) 01/03/2017    Review of Glycemic Control  Spoke with pt's wife and she states that Dr. Inda Merlin' office gives pt Toujeo pens (15 units QAM) and Humalog (3-8 units bid at lunch and dinner). Had called his pharmacy to inquire about pt's insulin and pharmacist said they had not filled an insulin prescription in almost 2 year. Called MD office and never got answer. Wife was called and stated that office was giving insulin pens to them. Wife states pt's blood sugars are "pretty good" at home. Has occasional lows and treats with orange juice. Takes logbook to MD appts. Pt's wife states blood sugars always go up when pt is hospitalized.  Home meds: Toujeo pen 15 units QAM (mid-am when pt wakes up) Humalog pen 3-8 units bid with lunch and dinner  This is latest home diabetes meds per wife.  Blood sugar elevated this am at 248 mg/dL.  This is after receiving Levemir 15 units in am and Lantus 10 units QHS.  Recommendations: Add Novolog 3 units tidwc if pt eats > 50% meal.  Thank you. Lorenda Peck, RD, LDN, CDE Inpatient Diabetes Coordinator [phone]

## 2017-01-06 LAB — GLUCOSE, CAPILLARY
GLUCOSE-CAPILLARY: 122 mg/dL — AB (ref 65–99)
Glucose-Capillary: 198 mg/dL — ABNORMAL HIGH (ref 65–99)
Glucose-Capillary: 337 mg/dL — ABNORMAL HIGH (ref 65–99)
Glucose-Capillary: 192 mg/dL — ABNORMAL HIGH (ref 65–99)

## 2017-01-06 LAB — BASIC METABOLIC PANEL
ANION GAP: 10 (ref 5–15)
BUN: 21 mg/dL — ABNORMAL HIGH (ref 6–20)
CALCIUM: 8.8 mg/dL — AB (ref 8.9–10.3)
CO2: 31 mmol/L (ref 22–32)
Chloride: 99 mmol/L — ABNORMAL LOW (ref 101–111)
Creatinine, Ser: 1.42 mg/dL — ABNORMAL HIGH (ref 0.61–1.24)
GFR, EST AFRICAN AMERICAN: 55 mL/min — AB (ref >60)
GFR, EST NON AFRICAN AMERICAN: 47 mL/min — AB (ref >60)
Glucose, Bld: 114 mg/dL — ABNORMAL HIGH (ref 65–99)
Potassium: 4.8 mmol/L (ref 3.5–5.1)
SODIUM: 140 mmol/L (ref 135–145)

## 2017-01-06 LAB — PROTIME-INR
INR: 2.94
PROTHROMBIN TIME: 31.3 s — AB (ref 11.4–15.2)

## 2017-01-06 MED ORDER — METOPROLOL SUCCINATE ER 25 MG PO TB24
25.0000 mg | ORAL_TABLET | Freq: Every day | ORAL | Status: DC
Start: 2017-01-06 — End: 2017-01-08
  Administered 2017-01-07 – 2017-01-08 (×2): 25 mg via ORAL
  Filled 2017-01-06 (×2): qty 1

## 2017-01-06 MED ORDER — TAMSULOSIN HCL 0.4 MG PO CAPS
0.4000 mg | ORAL_CAPSULE | Freq: Every day | ORAL | Status: DC
  Administered 2017-01-06 – 2017-01-07 (×2): 0.4 mg via ORAL
  Filled 2017-01-06 (×2): qty 1

## 2017-01-06 NOTE — Progress Notes (Signed)
ANTICOAGULATION CONSULT NOTE - Follow up  Pharmacy Consult for Xarelto Indication: atrial fibrillation  No Known Allergies  Patient Measurements: Height: 5\' 9"  (175.3 cm) Weight: 195 lb 5.2 oz (88.6 kg) IBW/kg (Calculated) : 70.7  Vital Signs: Temp: 99.2 F (37.3 C) (05/15 0604) Temp Source: Oral (05/15 0604) BP: 91/56 (05/15 0604) Pulse Rate: 91 (05/15 0604)  Labs:  Recent Labs  01/04/17 0547 01/05/17 0453 01/06/17 0530  HGB  --  9.5*  --   HCT  --  29.8*  --   PLT  --  327  --   LABPROT 21.8* 23.4* 31.3*  INR 1.87 2.04 2.94  CREATININE 1.12 1.16 1.42*    Estimated Creatinine Clearance: 51 mL/min (A) (by C-G formula based on SCr of 1.42 mg/dL (H)).  Medications: Scheduled:  . atorvastatin  10 mg Oral Daily  . carvedilol  6.25 mg Oral BID WC  . feeding supplement (ENSURE ENLIVE)  237 mL Oral BID BM  . furosemide  40 mg Oral Daily  . insulin aspart  0-15 Units Subcutaneous TID WC  . insulin aspart  0-5 Units Subcutaneous QHS  . insulin detemir  15 Units Subcutaneous Daily  . insulin glargine  10 Units Subcutaneous QHS  . iron polysaccharides  150 mg Oral Daily  . potassium chloride  20 mEq Oral BID  . pregabalin  75 mg Oral BID  . protein supplement shake  11 oz Oral BID BM  . saccharomyces boulardii  250 mg Oral BID  . sacubitril-valsartan  1 tablet Oral BID  . spironolactone  25 mg Oral Daily  . Warfarin - Pharmacist Dosing Inpatient   Does not apply q1800    Assessment: 71 yo male with past medical history significant for NICM EF 20%, BiV and ICD, IDDM, pAF on warfarin, bilateral BKA, and recently diagnosed pancreatic cancer w/ mets to liver who presents with 2 days SOB.  Pharmacy consulted to continue dosing warfarin for AFib.  Warfarin changed to Xarelto.  Home dose 10mg  daily and 15mg  on Wednesdays.  LD on 5/10. Admission INR 2.12  Today, 01/06/2017: INR remains therapeutic, but large increase overnight from 2.04 > 2.94 Hgb low but improved to 9.5,  Plt remain WNL (last on 5/14) No major drug-drug interactions noted. SCr increased to 1.42 Metastatic liver disease:  Albumin 2.5, AST 58 (last on 5/14)  Goal of Therapy:  INR 2-3 Monitor platelets by anticoagulation protocol: Yes   Plan:   Hold anticoagulant dose tonight given large/quick INR increase.  INR with AM labs.  Recommend heme/onc evaluation of anticoagulant choices.  Gretta Arab PharmD, BCPS Pager [phone] 01/06/2017 1:47 PM

## 2017-01-06 NOTE — Care Management Note (Signed)
Case Management Note  Patient Details  Name: [name] [name] MRN: [id number] Date of Birth: [birth year]  Subjective/Objective:   Pt admitted with CHF                 Action/Plan: Home with wife and [facility]   Expected Discharge Date:   01/07/17               Expected Discharge Plan:  [place]  In-House Referral:  NA  Discharge planning Services  CM Consult  Post Acute Care Choice:  NA Choice offered to:  Spouse  DME Arranged:  N/A DME Agency:     HH Arranged:  RN, PT [place] Agency:  [place]  Status of Service:  Completed, signed off  If discussed at [place] of Stay Meetings, dates discussed:    Additional CommentsPurcell Mouton, RN 01/06/2017, 1:03 PM

## 2017-01-06 NOTE — Discharge Summary (Signed)
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  ICD in '05, BiV ICD 11/10   PAF (paroxysmal atrial fibrillation) (HCC)   Type 2 diabetes mellitus with complication, with long-term current use of insulin (HCC)   Anemia of chronic disease   Pancreatic adenocarcinoma (HCC)  Fever-not PNA Probably low grade form Cdiff.  No recurrence= no diarr Monitor if spikes above 100.5 For blood and drain culture if tmax above 101  ? AECHF NICM Diuresing lasix 40 daily--feeling better-changed ot PO 5/14 Cont entresto 1 bid, as low bp did d/c aldactone 25 od, Coreg 6.25 bid-->metoprolol xl 25 on 01/06/2017 I/o -4.2 L since admit Weight not best assesement of fluid status-has dropped from 214-204-194 in 3 weeks--has cancer as well  Chr cholecystotomy Poor candidate for any type of surgery-OP drain clinic appt re-scheduled [supposed to be 5/15 am dr. Latanya Presser forward to him as FYI to coordinate inpatient drain study] No sepsis so no abx needed currently continue drain flushes  Recent Cdiff Completed therapy 01/03/17  Urin retention Indwelling cath placed 5/14 Start  flomax 0.4 mg Clamp foley in am  bilat BKA for osteo-stable-ambulatory with false legs  PAfib/Slow VT with AICD  Coreg 6.25 as above Tele benign and d/c 5/13 Pharm do to dose coumadin-currently 2.04  uncontrolled DM with some hypoglycemia cbg 63 01/04/17 am Cut lantus in pm 15-->10  Home meds include Toujeo pens 15U from Md office Will need also tid insulin if eating more food Appreciate DM coordinator input and assistance  HLD Hold statin  Pancreatic Cancer incidentally found recently Not a candidate for chemo given ECOG Discussed with Dr. Leveda Anna is a candidate for XRT only Dr. Benay Spice will see patient and give recs.  Adult FTT might require hopsice discussions soon?  Microcytic anemia probably from coumadin and chr slow blood loss stable   Full code-discussed at length with patient/wife bedside.  mentione din layman's terms ECOG status and co-morbids that he has.  They would like to hear from Dr. Benay Spice to make an informed decision I believe subsequently, Pallaitve car eor hopsice could weight is Inpatient still pending resolution of hypotension-monitor for further temp and sources of sepsis--Indwelling drain etc. Could go home 1-2 days if goals clarified   Consultants:   Dr. Benay Spice of Oncology--will be seeing patient 5/15 am  Procedures:   none  Antimicrobials:   compelted vanc 5/12    Subjective:  some low grade temps vomit x 2 overnightgiven zofran as well decreased voiding as well-I/o cathed 1125  Seems stable  Objective: Vitals:   01/05/17 2309 01/06/17 0604 01/06/17 1236 01/06/17 1424  BP: (!) 110/59 (!) 91/56 97/83 [name])  105/57  Pulse: 96 91 84 86  Resp: 18 15 20 20   Temp: 99.6 F (37.6 C) 99.2 F (37.3 C) 98.8 F (37.1 C) 98.7 F (37.1 C)  TempSrc: Oral Oral Oral Oral  SpO2: 92% 95% 97% 100%  Weight:  88.6 kg (195 lb 5.2 oz)    Height:        Intake/Output Summary (Last 24 hours) at 01/06/17 1507 Last data filed at 01/06/17  1345  Gross per 24 hour  Intake              940 ml  Output             1545 ml  Net             -605 ml   Filed Weights   01/05/17 0500 01/05/17 0713 01/06/17 0604  Weight: 89.3 kg (196 lb 13.9 oz) 88.1 kg (194 lb 3.6 oz) 88.6 kg (195 lb 5.2 oz)    Examination:  Alert pleasant-neck soft supple, no jvd Chest clinically clear no added sound no heaves NSR, S1-S2, RRR and paced rythmn Neurologically intact moves 4 limbs equally Stumps bilaterally clean   Data Reviewed: I have personally reviewed following labs and imaging studies  CBC:  Recent Labs Lab 01/02/17 2237 01/03/17 0518 01/05/17 0453  WBC 9.4 6.4 6.6  NEUTROABS 8.3*  --  5.3  HGB 9.0* 8.2* 9.5*  HCT 26.6* 25.4* 29.8*  MCV 73.5* 73.8* 74.5*  PLT 340 305 035   Basic Metabolic Panel:  Recent Labs Lab 01/03/17 0042 01/04/17 0547 01/05/17 0453 01/06/17 0530  NA 137 141 137 140  K 4.6 4.1 4.6 4.8  CL 106 106 99* 99*  CO2 24 26 28 31   GLUCOSE 317* 63* 248* 114*  BUN 24* 21* 23* 21*  CREATININE 1.20 1.12 1.16 1.42*  CALCIUM 8.3* 8.5* 8.6* 8.8*   GFR: Estimated Creatinine Clearance: 51 mL/min (A) (by C-G formula based on SCr of 1.42 mg/dL (H)). Liver Function Tests:  Recent Labs Lab 01/03/17 0042 01/05/17 0453  AST 16 58*  ALT 19 32  ALKPHOS 131* 169*  BILITOT 0.4 0.5  PROT 6.6 6.6  ALBUMIN 2.6* 2.5*    Recent Labs Lab 01/03/17 0042  LIPASE 16   No results for input(s): AMMONIA in the last 168 hours. Coagulation Profile:  Recent Labs Lab 01/02/17 2237 01/03/17 0518 01/04/17 0547 01/05/17 0453 01/06/17 0530  INR 2.12 1.95 1.87 2.04 2.94   Cardiac Enzymes: No results for input(s): CKTOTAL, CKMB, CKMBINDEX, TROPONINI in the last 168 hours. BNP (last 3 results) No results for input(s): PROBNP in the last 8760 hours. HbA1C: No results for input(s): HGBA1C in the last 72 hours. CBG:  Recent Labs Lab 01/05/17 1348 01/05/17 1654 01/05/17 2203 01/06/17 0810 01/06/17 1154  GLUCAP  181* 202* 142* 122* 198*   Lipid Profile: No results for input(s): CHOL, HDL, LDLCALC, TRIG, CHOLHDL, LDLDIRECT in the last 72 hours. Thyroid Function Tests: No results for input(s): TSH, T4TOTAL, FREET4, T3FREE, THYROIDAB in the last 72 hours. Anemia Panel: No results for input(s): VITAMINB12, FOLATE, FERRITIN, TIBC, IRON, RETICCTPCT in the last 72 hours. Urine analysis:    Component Value Date/Time   COLORURINE AMBER (A) 12/08/2016 1131   APPEARANCEUR TURBID (A) 12/08/2016 1131   LABSPEC 1.015 12/08/2016 1131   PHURINE 5.0 12/08/2016 1131   GLUCOSEU 150 (A) 12/08/2016 1131   HGBUR MODERATE (A) 12/08/2016 [place] 12/08/2016 1131   KETONESUR 5 (A) 12/08/2016 1131  PROTEINUR 100 (A) 12/08/2016 1131   UROBILINOGEN 0.2 01/11/2015 1850   NITRITE NEGATIVE 12/08/2016 1131   LEUKOCYTESUR LARGE (A) 12/08/2016 1131   Sepsis Labs: @LABRCNTIP (procalcitonin:4,lacticidven:4)  ) Recent Results (from the past 240 hour(s))  Body fluid culture     Status: None (Preliminary result)   Collection Time: 01/05/17  2:07 PM  Result Value Ref Range Status   Specimen Description GALL BLADDER  Final   Special Requests Immunocompromised  Final   Gram Stain   Final    RARE WBC PRESENT, PREDOMINANTLY PMN ABUNDANT GRAM NEGATIVE RODS    Culture   Final    ABUNDANT GRAM NEGATIVE RODS CULTURE REINCUBATED FOR BETTER GROWTH Performed at [facility], [place] [name], [place], [place] [address]    Report Status PENDING  Incomplete  Culture, blood (Routine X 2) w Reflex to ID Panel     Status: None (Preliminary result)   Collection Time: 01/05/17  2:17 PM  Result Value Ref Range Status   Specimen Description BLOOD LEFT ARM  Final   Special Requests   Final    BOTTLES DRAWN AEROBIC AND ANAEROBIC Blood Culture adequate volume   Culture   Final    NO GROWTH < 24 HOURS Performed at [facility], [place] [address] [place], [place] [address]    Report Status PENDING   Incomplete  Culture, blood (Routine X 2) w Reflex to ID Panel     Status: None (Preliminary result)   Collection Time: 01/05/17  2:18 PM  Result Value Ref Range Status   Specimen Description BLOOD LEFT ARM  Final   Special Requests   Final    BOTTLES DRAWN AEROBIC AND ANAEROBIC Blood Culture adequate volume   Culture   Final    NO GROWTH < 24 HOURS Performed at [facility], [place] [address] [place],  [address]    Report Status PENDING  Incomplete     Radiology Studies: No results found. Scheduled Meds: . atorvastatin  10 mg Oral Daily  . feeding supplement (ENSURE ENLIVE)  237 mL Oral BID BM  . furosemide  40 mg Oral Daily  . insulin aspart  0-15 Units Subcutaneous TID WC  . insulin aspart  0-5 Units Subcutaneous QHS  . insulin glargine  10 Units Subcutaneous QHS  . iron polysaccharides  150 mg Oral Daily  . metoprolol succinate  25 mg Oral Daily  . potassium chloride  20 mEq Oral BID  . pregabalin  75 mg Oral BID  . protein supplement shake  11 oz Oral BID BM  . saccharomyces boulardii  250 mg Oral BID  . sacubitril-valsartan  1 tablet Oral BID   Continuous Infusions:   LOS: 3 days    Time spent: [place], MD [facility] ([facility]   If 7PM-7AM, please contact night-coverage [email or web address] Password [email or web address] 01/06/2017, 3:07 PM

## 2017-01-06 NOTE — Clinical Social Work Note (Signed)
Clinical Social Work Assessment  Patient Details  Name: [name] [name] MRN: [id number] Date of Birth: [birth year]  Date of referral:  01/06/17               Reason for consult:  Transportation                Permission sought to share information with:    Permission granted to share information::     Name::        Agency::     Relationship::     Contact Information:     Housing/Transportation Living arrangements for the past 2 months:  Single Family Home Source of Information:  Spouse Patient Interpreter Needed:  None Criminal Activity/Legal Involvement Pertinent to Current Situation/Hospitalization:    Significant Relationships:  Adult Children, Spouse Lives with:  Spouse Do you feel safe going back to the place where you live?    Need for family participation in patient care:  Yes (Comment)  Care giving concerns:  Patient's wife is concerned with getting patient in and out of her vehicle when he has medical appointments.    Social Worker assessment / plan:  LCSW consulted for help with transportation.  LCSW met with patient and his wife at bedside to assess for services.  LCSW provided patient's wife with an application to apply for [employer] transportation.  This will help with transportation to medical appointments since patient will be able to stay in his wheelchair and wheel onto the bus.  LCSW also provided explanation of insurance benefits pertaining to SNF.   Employment status:  Retired Nurse, adult PT Recommendations:  Not assessed at this time Information / Referral to community resources:  Other (Comment Required) ([employer] for transportation help)  Patient/Family's Response to care:  Patient's wife hopeful that [employer] will help when she has to transport patient to his medical appointments.    Patient/Family's Understanding of and Emotional Response to Diagnosis, Current Treatment, and Prognosis:  Patient's wife understands diagnoses but is  awaiting patient's cancer doctor who will be consulting with hospital MD.   Emotional Assessment Appearance:  Appears stated age Attitude/Demeanor/Rapport:  Lethargic Affect (typically observed):  Accepting Orientation:  Oriented to Self, Oriented to Place, Oriented to  Time, Oriented to Situation Alcohol / Substance use:    Psych involvement (Current and /or in the community):     Discharge Needs  Concerns to be addressed:    Readmission within the last 30 days:  Yes Current discharge risk:  None Barriers to Discharge:  No Barriers Identified   Carlean Jews, LCSW 01/06/2017, 1:13 PM

## 2017-01-07 ENCOUNTER — Ambulatory Visit: Payer: Medicare Other | Admitting: Oncology

## 2017-01-07 ENCOUNTER — Other Ambulatory Visit: Payer: Medicare Other

## 2017-01-07 ENCOUNTER — Inpatient Hospital Stay (HOSPITAL_COMMUNITY): Payer: Medicare Other

## 2017-01-07 DIAGNOSIS — R338 Other retention of urine: Secondary | ICD-10-CM

## 2017-01-07 DIAGNOSIS — I5023 Acute on chronic systolic (congestive) heart failure: Secondary | ICD-10-CM

## 2017-01-07 DIAGNOSIS — I10 Essential (primary) hypertension: Secondary | ICD-10-CM

## 2017-01-07 DIAGNOSIS — E118 Type 2 diabetes mellitus with unspecified complications: Secondary | ICD-10-CM

## 2017-01-07 DIAGNOSIS — I480 Paroxysmal atrial fibrillation: Secondary | ICD-10-CM

## 2017-01-07 DIAGNOSIS — Z794 Long term (current) use of insulin: Secondary | ICD-10-CM

## 2017-01-07 DIAGNOSIS — C259 Malignant neoplasm of pancreas, unspecified: Secondary | ICD-10-CM

## 2017-01-07 LAB — COMPREHENSIVE METABOLIC PANEL
ALK PHOS: 208 U/L — AB (ref 38–126)
ALT: 56 U/L (ref 17–63)
ANION GAP: 9 (ref 5–15)
AST: 50 U/L — ABNORMAL HIGH (ref 15–41)
Albumin: 2.5 g/dL — ABNORMAL LOW (ref 3.5–5.0)
BILIRUBIN TOTAL: 1.2 mg/dL (ref 0.3–1.2)
BUN: 22 mg/dL — ABNORMAL HIGH (ref 6–20)
CALCIUM: 8.4 mg/dL — AB (ref 8.9–10.3)
CO2: 28 mmol/L (ref 22–32)
CREATININE: 1.43 mg/dL — AB (ref 0.61–1.24)
Chloride: 100 mmol/L — ABNORMAL LOW (ref 101–111)
GFR, EST AFRICAN AMERICAN: 55 mL/min — AB (ref >60)
GFR, EST NON AFRICAN AMERICAN: 47 mL/min — AB (ref >60)
Glucose, Bld: 169 mg/dL — ABNORMAL HIGH (ref 65–99)
Potassium: 4.9 mmol/L (ref 3.5–5.1)
SODIUM: 137 mmol/L (ref 135–145)
Total Protein: 6.7 g/dL (ref 6.5–8.1)

## 2017-01-07 LAB — GLUCOSE, CAPILLARY
GLUCOSE-CAPILLARY: 155 mg/dL — AB (ref 65–99)
GLUCOSE-CAPILLARY: 293 mg/dL — AB (ref 65–99)
GLUCOSE-CAPILLARY: 253 mg/dL — AB (ref 65–99)
Glucose-Capillary: 294 mg/dL — ABNORMAL HIGH (ref 65–99)

## 2017-01-07 LAB — CBC WITH DIFFERENTIAL/PLATELET
BASOS ABS: 0 10*3/uL (ref 0.0–0.1)
BASOS PCT: 0 %
EOS ABS: 0.2 10*3/uL (ref 0.0–0.7)
Eosinophils Relative: 4 %
HEMATOCRIT: 28.9 % — AB (ref 39.0–52.0)
HEMOGLOBIN: 9.2 g/dL — AB (ref 13.0–17.0)
Lymphocytes Relative: 9 %
Lymphs Abs: 0.5 10*3/uL — ABNORMAL LOW (ref 0.7–4.0)
MCH: 23.9 pg — ABNORMAL LOW (ref 26.0–34.0)
MCHC: 31.8 g/dL (ref 30.0–36.0)
MCV: 75.1 fL — ABNORMAL LOW (ref 78.0–100.0)
MONOS PCT: 9 %
Monocytes Absolute: 0.5 10*3/uL (ref 0.1–1.0)
NEUTROS ABS: 4.2 10*3/uL (ref 1.7–7.7)
NEUTROS PCT: 78 %
Platelets: 269 10*3/uL (ref 150–400)
RBC: 3.85 MIL/uL — ABNORMAL LOW (ref 4.22–5.81)
RDW: 19.9 % — ABNORMAL HIGH (ref 11.5–15.5)
WBC: 5.4 10*3/uL (ref 4.0–10.5)

## 2017-01-07 LAB — PROTIME-INR
INR: 4.65
Prothrombin Time: 45.2 seconds — ABNORMAL HIGH (ref 11.4–15.2)

## 2017-01-07 MED ORDER — INSULIN GLARGINE 100 UNIT/ML ~~LOC~~ SOLN
15.0000 [IU] | Freq: Every day | SUBCUTANEOUS | Status: DC
  Administered 2017-01-07: 15 [IU] via SUBCUTANEOUS
  Filled 2017-01-07 (×2): qty 0.15

## 2017-01-07 NOTE — Progress Notes (Signed)
ANTICOAGULATION CONSULT NOTE - Follow up  Pharmacy Consult for Xarelto Indication: atrial fibrillation  No Known Allergies  Patient Measurements: Height: '5\' 9"'$  (175.3 cm) Weight: 195 lb 5.2 oz (88.6 kg) IBW/kg (Calculated) : 70.7  Vital Signs: Temp: 98.7 F (37.1 C) (05/16 1117) Temp Source: Oral (05/16 1117) BP: 102/61 (05/16 1117) Pulse Rate: 83 (05/16 1117)  Labs:  Recent Labs  01/05/17 0453 01/06/17 0530 01/07/17 0531  HGB 9.5*  --  9.2*  HCT 29.8*  --  28.9*  PLT 327  --  269  LABPROT 23.4* 31.3* 45.2*  INR 2.04 2.94 4.65*  CREATININE 1.16 1.42* 1.43*    Estimated Creatinine Clearance: 50.7 mL/min (A) (by C-G formula based on SCr of 1.43 mg/dL (H)).  Medications: Scheduled:  . atorvastatin  10 mg Oral Daily  . feeding supplement (ENSURE ENLIVE)  237 mL Oral BID BM  . furosemide  40 mg Oral Daily  . insulin aspart  0-15 Units Subcutaneous TID WC  . insulin aspart  0-5 Units Subcutaneous QHS  . insulin glargine  10 Units Subcutaneous QHS  . iron polysaccharides  150 mg Oral Daily  . metoprolol succinate  25 mg Oral Daily  . potassium chloride  20 mEq Oral BID  . pregabalin  75 mg Oral BID  . protein supplement shake  11 oz Oral BID BM  . saccharomyces boulardii  250 mg Oral BID  . sacubitril-valsartan  1 tablet Oral BID  . tamsulosin  0.4 mg Oral QPC supper    Assessment: 72 yo male with past medical history significant for NICM EF 20%, BiV and ICD, IDDM, PAF on warfarin, bilateral BKA, and recently diagnosed pancreatic cancer w/ mets to liver who presents with 2 days SOB.  Pharmacy initially consulted to continue dosing warfarin on admission. Warfarin changed to Xarelto on 5/15 per MD.   Last dose of warfarin given inpatient 5/14  Anticoagulant dose held 5/15 given large/quick INR increase.(2.04 > 2.94)  INR increased further to supratherapeutic range today, 4.65  CBC: Hgb low, but stable; Pltc WNL   SCr increased to 1.43. CrCl calculated as ~ 58  ml/min using actual body weight  Albumin 2.5, AST/ALT 50/56, Alk Phos 2.8  Goal of Therapy:  Therapeutic anticoagulation Stroke prevention   Plan:   Given supratherapeutic INR, continue to hold anticoagulation today.   Will order INR with AM labs. Hold off on starting Xarelto until INR < 3.   Per d/w Dr. Quincy Simmonds, he will plan to discuss anticoagulant choices with heme/onc.    Lindell Spar, PharmD, BCPS Pager: [phone] 01/07/2017 12:34 PM

## 2017-01-07 NOTE — Progress Notes (Signed)
PROGRESS NOTE [facility]   [name] [name]   UTM:[id number] DOB: 1945/12/24  DOA: 01/02/2017 PCP: Josetta Huddle, MD   Brief Narrative:  71 year old male with past medical history significant for NICM EF 20%, BiV and ICD, diabetes mellitus, PAF on warfarin, bilateral BKA and recently diagnosed with pancreatic cancer presented to the emergency department with 2 days of breath prior to admission. Patient was admitted for CHF exacerbation.   Pancreatic cancer recently diagnosed during last hospitalization he was recommended for hospice but Dr. Learta Codding offerred presenting case to tumor board.   Subjective: Patient seen and examined, doing much better asking when is he going home. SOB has resolved. No fever. Had urinary retention and foley was placed 2 days ago.   Assessment & Plan: Acute on chronic systolic CHF Diabetes well, initially treated with IV Lasix now on Lasix by mouth. Continue intestinal twice a day, Aldactone was discontinued due to low BP, Coreg 6.25 twice a day was changed to metoprolol XL 25 daily on 01/06/2017. Repeat BNP and get CXR in AM  Monitor I and O's Low salt diet  Chr Cholecystotomy  Poor candidate for any type of surgery We will discuss with IR to see if tubes need to be driving while inpatient No signs of infection this time  PAF/slow VT with AICD Heart rate well controlled On Coumadin Monitor INR  Uncontrolled diabetes mellitus type 2 CBG is slight stable A1c 10.5 on 01/02/17 Increase Lantus to 15 units Monitor CBGs  Urinary retention Started on Flomax 0.4 mg daily Foley catheter in - removed and had void trials. If no void in more than 6 hours bladder scan to more than 400 mL place Foley patient will follow up with urologist as outpatient.  Pancreatic cancer incidentally found recently Not candidate for chemotherapy Dr. Learta Codding - patient is only a candidate for XRT Follow-up oncology recommendation Need to discuss goals of  care  DVT prophylaxis: Coumadin  Code Status: Full  Family Communication: None at bedside  Disposition Plan: Home in 24-48 hrs.  Consultants:   Oncology    Procedures:     Antimicrobials: Antibiotics Given (last 72 hours)    None       Objective: Vitals:   01/06/17 2050 01/07/17 0410 01/07/17 1117 01/07/17 1249  BP: (!) 99/58 93/64 102/61 (!) 113/58  Pulse: 95 77 83 84  Resp: 18 18  19   Temp: 99.2 F (37.3 C) 98.7 F (37.1 C) 98.7 F (37.1 C) 98.5 F (36.9 C)  TempSrc: Oral Oral Oral Oral  SpO2: 99% 99%  98%  Weight:  88.6 kg (195 lb 5.2 oz)    Height:        Intake/Output Summary (Last 24 hours) at 01/07/17 1715 Last data filed at 01/07/17 1149  Gross per 24 hour  Intake              690 ml  Output             1995 ml  Net            -1305 ml   Filed Weights   01/05/17 0713 01/06/17 0604 01/07/17 0410  Weight: 88.1 kg (194 lb 3.6 oz) 88.6 kg (195 lb 5.2 oz) 88.6 kg (195 lb 5.2 oz)    Examination:  General exam: Appears calm and comfortable  Respiratory system: Clear to auscultation. No wheezes,crackle or rhonchi Cardiovascular system: S1 & S2 heard, RRR. No JVD, murmurs, rubs or gallops Gastrointestinal system: Abdomen is nondistended, soft and  nontender. No organomegaly or masses felt. Normal bowel sounds heard. Central nervous system: Alert and oriented. No focal neurological deficits. Extremities: BKA, stump ok  Skin: No rashes, lesions or ulcers Psychiatry: Judgement and insight appear normal. Mood & affect appropriate.    Data Reviewed: I have personally reviewed following labs and imaging studies  CBC:  Recent Labs Lab 01/02/17 2237 01/03/17 0518 01/05/17 0453 01/07/17 0531  WBC 9.4 6.4 6.6 5.4  NEUTROABS 8.3*  --  5.3 4.2  HGB 9.0* 8.2* 9.5* 9.2*  HCT 26.6* 25.4* 29.8* 28.9*  MCV 73.5* 73.8* 74.5* 75.1*  PLT 340 305 327 258   Basic Metabolic Panel:  Recent Labs Lab 01/03/17 0042 01/04/17 0547 01/05/17 0453 01/06/17 0530  01/07/17 0531  NA 137 141 137 140 137  K 4.6 4.1 4.6 4.8 4.9  CL 106 106 99* 99* 100*  CO2 24 26 28 31 28   GLUCOSE 317* 63* 248* 114* 169*  BUN 24* 21* 23* 21* 22*  CREATININE 1.20 1.12 1.16 1.42* 1.43*  CALCIUM 8.3* 8.5* 8.6* 8.8* 8.4*   GFR: Estimated Creatinine Clearance: 50.7 mL/min (A) (by C-G formula based on SCr of 1.43 mg/dL (H)). Liver Function Tests:  Recent Labs Lab 01/03/17 0042 01/05/17 0453 01/07/17 0531  AST 16 58* 50*  ALT 19 32 56  ALKPHOS 131* 169* 208*  BILITOT 0.4 0.5 1.2  PROT 6.6 6.6 6.7  ALBUMIN 2.6* 2.5* 2.5*    Recent Labs Lab 01/03/17 0042  LIPASE 16   Coagulation Profile:  Recent Labs Lab 01/03/17 0518 01/04/17 0547 01/05/17 0453 01/06/17 0530 01/07/17 0531  INR 1.95 1.87 2.04 2.94 4.65*   CBG:  Recent Labs Lab 01/06/17 1659 01/06/17 2128 01/07/17 0800 01/07/17 1158 01/07/17 1650  GLUCAP 337* 192* 155* 294* 293*   Sepsis Labs:  Recent Labs Lab 01/02/17 2248 01/03/17 0054  LATICACIDVEN 1.10 0.75    Recent Results (from the past 240 hour(s))  Body fluid culture     Status: None (Preliminary result)   Collection Time: 01/05/17  2:07 PM  Result Value Ref Range Status   Specimen Description GALL BLADDER  Final   Special Requests Immunocompromised  Final   Gram Stain   Final    RARE WBC PRESENT, PREDOMINANTLY PMN ABUNDANT GRAM NEGATIVE RODS    Culture   Final    ABUNDANT GRAM NEGATIVE RODS CULTURE REINCUBATED FOR BETTER GROWTH Performed at [facility], [place] [address] [place], [place] [address]    Report Status PENDING  Incomplete  Culture, blood (Routine X 2) w Reflex to ID Panel     Status: None (Preliminary result)   Collection Time: 01/05/17  2:17 PM  Result Value Ref Range Status   Specimen Description BLOOD LEFT ARM  Final   Special Requests   Final    BOTTLES DRAWN AEROBIC AND ANAEROBIC Blood Culture adequate volume   Culture   Final    NO GROWTH 2 DAYS Performed at [facility], [address], [place], [place] [address]    Report Status PENDING  Incomplete  Culture, blood (Routine X 2) w Reflex to ID Panel     Status: None (Preliminary result)   Collection Time: 01/05/17  2:18 PM  Result Value Ref Range Status   Specimen Description BLOOD LEFT ARM  Final   Special Requests   Final    BOTTLES DRAWN AEROBIC AND ANAEROBIC Blood Culture adequate volume   Culture   Final    NO GROWTH 2  DAYS Performed at [facility], [place] [address] [place],  [address]    Report Status PENDING  Incomplete     Radiology Studies: No results found.   Scheduled Meds: . atorvastatin  10 mg Oral Daily  . feeding supplement (ENSURE ENLIVE)  237 mL Oral BID BM  . furosemide  40 mg Oral Daily  . insulin aspart  0-15 Units Subcutaneous TID WC  . insulin aspart  0-5 Units Subcutaneous QHS  . insulin glargine  10 Units Subcutaneous QHS  . iron polysaccharides  150 mg Oral Daily  . metoprolol succinate  25 mg Oral Daily  . potassium chloride  20 mEq Oral BID  . pregabalin  75 mg Oral BID  . protein supplement shake  11 oz Oral BID BM  . saccharomyces boulardii  250 mg Oral BID  . sacubitril-valsartan  1 tablet Oral BID  . tamsulosin  0.4 mg Oral QPC supper   Continuous Infusions:   LOS: 4 days    Chipper Oman, MD Pager: Text Page via [email or web address]  [phone]  If 7PM-7AM, please contact night-coverage [email or web address] Password [email or web address] 01/07/2017, 5:15 PM

## 2017-01-08 DIAGNOSIS — R0600 Dyspnea, unspecified: Secondary | ICD-10-CM

## 2017-01-08 DIAGNOSIS — I509 Heart failure, unspecified: Secondary | ICD-10-CM

## 2017-01-08 DIAGNOSIS — R509 Fever, unspecified: Secondary | ICD-10-CM

## 2017-01-08 DIAGNOSIS — C251 Malignant neoplasm of body of pancreas: Secondary | ICD-10-CM

## 2017-01-08 DIAGNOSIS — K769 Liver disease, unspecified: Secondary | ICD-10-CM

## 2017-01-08 DIAGNOSIS — I739 Peripheral vascular disease, unspecified: Secondary | ICD-10-CM

## 2017-01-08 DIAGNOSIS — K6289 Other specified diseases of anus and rectum: Secondary | ICD-10-CM

## 2017-01-08 DIAGNOSIS — E119 Type 2 diabetes mellitus without complications: Secondary | ICD-10-CM

## 2017-01-08 DIAGNOSIS — D509 Iron deficiency anemia, unspecified: Secondary | ICD-10-CM

## 2017-01-08 DIAGNOSIS — N189 Chronic kidney disease, unspecified: Secondary | ICD-10-CM

## 2017-01-08 DIAGNOSIS — I4891 Unspecified atrial fibrillation: Secondary | ICD-10-CM

## 2017-01-08 DIAGNOSIS — N179 Acute kidney failure, unspecified: Secondary | ICD-10-CM

## 2017-01-08 DIAGNOSIS — Z89511 Acquired absence of right leg below knee: Secondary | ICD-10-CM

## 2017-01-08 DIAGNOSIS — Z89512 Acquired absence of left leg below knee: Secondary | ICD-10-CM

## 2017-01-08 LAB — BASIC METABOLIC PANEL
ANION GAP: 8 (ref 5–15)
BUN: 22 mg/dL — ABNORMAL HIGH (ref 6–20)
CALCIUM: 8.2 mg/dL — AB (ref 8.9–10.3)
CHLORIDE: 99 mmol/L — AB (ref 101–111)
CO2: 28 mmol/L (ref 22–32)
Creatinine, Ser: 1.25 mg/dL — ABNORMAL HIGH (ref 0.61–1.24)
GFR calc Af Amer: >60 mL/min (ref >60)
GFR calc non Af Amer: 55 mL/min — ABNORMAL LOW (ref >60)
GLUCOSE: 253 mg/dL — AB (ref 65–99)
Potassium: 4.9 mmol/L (ref 3.5–5.1)
Sodium: 135 mmol/L (ref 135–145)

## 2017-01-08 LAB — GLUCOSE, CAPILLARY
Glucose-Capillary: 234 mg/dL — ABNORMAL HIGH (ref 65–99)
Glucose-Capillary: 280 mg/dL — ABNORMAL HIGH (ref 65–99)

## 2017-01-08 LAB — CBC
HEMATOCRIT: 29 % — AB (ref 39.0–52.0)
Hemoglobin: 9.4 g/dL — ABNORMAL LOW (ref 13.0–17.0)
MCH: 23.9 pg — ABNORMAL LOW (ref 26.0–34.0)
MCHC: 32.4 g/dL (ref 30.0–36.0)
MCV: 73.8 fL — AB (ref 78.0–100.0)
Platelets: 282 10*3/uL (ref 150–400)
RBC: 3.93 MIL/uL — ABNORMAL LOW (ref 4.22–5.81)
RDW: 19.2 % — AB (ref 11.5–15.5)
WBC: 6 10*3/uL (ref 4.0–10.5)

## 2017-01-08 LAB — BODY FLUID CULTURE

## 2017-01-08 LAB — PROTIME-INR
INR: 2.96
Prothrombin Time: 31.4 seconds — ABNORMAL HIGH (ref 11.4–15.2)

## 2017-01-08 LAB — BRAIN NATRIURETIC PEPTIDE: B Natriuretic Peptide: 1149.8 pg/mL — ABNORMAL HIGH (ref 0.0–100.0)

## 2017-01-08 MED ORDER — TAMSULOSIN HCL 0.4 MG PO CAPS: 0.4000 mg | ORAL_CAPSULE | Freq: Every day | ORAL | 0 refills | Status: AC

## 2017-01-08 MED ORDER — RIVAROXABAN 20 MG PO TABS: 20.0000 mg | ORAL_TABLET | Freq: Every day | ORAL | 0 refills | Status: DC

## 2017-01-08 MED ORDER — POTASSIUM CHLORIDE CRYS ER 20 MEQ PO TBCR: 20.0000 meq | EXTENDED_RELEASE_TABLET | Freq: Two times a day (BID) | ORAL | 0 refills | Status: DC

## 2017-01-08 MED ORDER — FUROSEMIDE 20 MG PO TABS: 20.0000 mg | ORAL_TABLET | Freq: Two times a day (BID) | ORAL | 0 refills | Status: DC

## 2017-01-08 MED ORDER — RIVAROXABAN 20 MG PO TABS: 20.0000 mg | ORAL_TABLET | Freq: Every day | ORAL | Status: DC

## 2017-01-08 NOTE — Discharge Summary (Signed)
Physician Discharge Summary  [name] [name]  PVX:[id number]  DOB: Oct 12, 1945  DOA: 01/02/2017 PCP: [name] Huddle, MD  Admit date: 01/02/2017 Discharge date: 01/08/2017  Admitted From: Home  Disposition:  Home   Recommendations for Outpatient Follow-up:  1. Follow up with PCP in 1-2 weeks 2. Please obtain BMP/CBC in one week 3. Please follow up on the following pending results:  Home Health: None  Equipment/Devices: None   Discharge Condition: Improved  CODE STATUS: FULL  Diet recommendation: Heart Healthy / Carb Modified   Brief/Interim Summary: 71 year old male with past medical history significant for NICM EF 20%, BiV and ICD, diabetes mellitus, PAF on warfarin, bilateral BKA and recently diagnosed with pancreatic cancer presented to the emergency department with 2 days of breath prior to admission. Patient was admitted for CHF exacerbation treated with IV Lasix then transitioned to PO. Good diuresis. Clinically patient improved and deemed stable for discharge   Pancreatic cancer recently diagnosed during last hospitalization he was recommended for hospice. Dr. Learta Codding presented the case to the tumor board, which concluded that patient may benefit from radiation therapy, this will be done as outpatient. Oncology team arranging.   Subjective: Patient seen and examined, report breathing is back to baseline. Denies chest pain, SOB, cough, palpitations and dizziness. No acute events overnight. Patient remains afebrile. Tolerating diet well.   Discharge Diagnoses/Hospital Course:  Acute on chronic systolic CHF EF 48-27% 0/78/6754 Patient initially treated with IV Lasix then transitioned to PO Lasix.  Will continue Lasix 20 mg BID  Continue Entresto BID, Aldactone was discontinued due to low BP and AKI, Coreg 6.25 twice a day was changed to metoprolol XL 25 daily during hospital stay, upon discharge patient placed back to Coreg. Aldactone continue on hold until seen by cardiology  or PCP  BNP unreliable given severity of EF Daily weights  Low salt diet  AKI on CKD stage II - 2/2 to agressive diuresis  Lasix where held the day prior to discharge, with good improvement on Cr  Will resume Lasix on reduced dose 20 mg BID and follow up with PCP in 3-5 days to monitor Cr   Chr Cholecystotomy  Poor candidate for any type of surgery Case discussed with Dr Pascal Lux, no need for interventions at this time patient is schedule to change drain in 3-4 months, if no complications occurs before. Catheter draining well at this time with no signs of infection.  Follow up with IR   PAF/slow VT with AICD Heart rate well controlled Coumadin switched for Xarelto Follow up with PCP    Uncontrolled diabetes mellitus type 2 CBG is slight unstable Resume home medication, Will resume Levemir 15 units BID Patient also on Tuojeo, will hold this medication for now as he shouldn't be on 2 different basal insulin, defer to PCP for further management  A1c 10.5 on 01/02/17 Monitor CBGs  Urinary retention Started on Flomax 0.4 mg daily Foley catheter in - removed and had void trials, which he successfully voided  Follow up with PCP   Pancreatic cancer incidentally found recently Not candidate for chemotherapy Dr. Learta Codding - patient is only a candidate for XRT Need to discuss goal of care with PCP and Oncology as patient at this moments wants to remain full code.   Gallbladder fluid growth ESBL, asymptomatic  Not abx indicated at this time   All other chronic medical condition were stable during the hospitalization.  On the day of the discharge the patient's vitals were stable, and no other  acute medical condition were reported by patient. Patient was felt safe to be discharge to home   Discharge Instructions  You were cared for by a hospitalist during your hospital stay. If you have any questions about your discharge medications or the care you received while you were in the hospital  after you are discharged, you can call the unit and asked to speak with the hospitalist on call if the hospitalist that took care of you is not available. Once you are discharged, your primary care physician will handle any further medical issues. Please note that NO REFILLS for any discharge medications will be authorized once you are discharged, as it is imperative that you return to your primary care physician (or establish a relationship with a primary care physician if you do not have one) for your aftercare needs so that they can reassess your need for medications and monitor your lab values.  Discharge Instructions    (HEART FAILURE PATIENTS) Call MD:  Anytime you have any of the following symptoms: 1) 3 pound weight gain in 24 hours or 5 pounds in 1 week 2) shortness of breath, with or without a dry hacking cough 3) swelling in the hands, feet or stomach 4) if you have to sleep on extra pillows at night in order to breathe.    Complete by:  As directed    Call MD for:  difficulty breathing, headache or visual disturbances    Complete by:  As directed    Call MD for:  extreme fatigue    Complete by:  As directed    Call MD for:  hives    Complete by:  As directed    Call MD for:  persistant dizziness or light-headedness    Complete by:  As directed    Call MD for:  persistant nausea and vomiting    Complete by:  As directed    Call MD for:  redness, tenderness, or signs of infection (pain, swelling, redness, odor or green/yellow discharge around incision site)    Complete by:  As directed    Call MD for:  severe uncontrolled pain    Complete by:  As directed    Call MD for:  temperature >100.4    Complete by:  As directed    Diet - low sodium heart healthy    Complete by:  As directed    Diet Carb Modified    Complete by:  As directed    Increase activity slowly    Complete by:  As directed      Allergies as of 01/08/2017   No Known Allergies     Medication List    STOP  taking these medications   spironolactone 25 MG tablet Commonly known as:  ALDACTONE   TOUJEO SOLOSTAR 300 UNIT/ML Sopn Generic drug:  Insulin Glargine   vancomycin 125 MG capsule Commonly known as:  VANCOCIN   warfarin 10 MG tablet Commonly known as:  COUMADIN     TAKE these medications   acetaminophen 325 MG tablet Commonly known as:  TYLENOL Take 2 tablets (650 mg total) by mouth every 6 (six) hours as needed for mild pain (or Fever >/= 101).   alum & mag hydroxide-simeth 200-200-20 MG/5ML suspension Commonly known as:  MAALOX/MYLANTA Take 15 mLs by mouth every 4 (four) hours as needed for indigestion or heartburn.   atorvastatin 10 MG tablet Commonly known as:  LIPITOR Take 1 tablet (10 mg total) by mouth daily.   carvedilol  6.25 MG tablet Commonly known as:  COREG Take 1 tablet (6.25 mg total) by mouth 2 (two) times daily with a meal.   cholecalciferol 1000 units tablet Commonly known as:  VITAMIN D Take 1,000 Units by mouth daily.   furosemide 20 MG tablet Commonly known as:  LASIX Take 1 tablet (20 mg total) by mouth 2 (two) times daily. What changed:  medication strength  how much to take  when to take this   insulin detemir 100 UNIT/ML injection Commonly known as:  LEVEMIR Inject 0.15 mLs (15 Units total) into the skin 2 (two) times daily.   iron polysaccharides 150 MG capsule Commonly known as:  NIFEREX Take 1 capsule (150 mg total) by mouth daily.   multivitamin with minerals Tabs tablet Take 1 tablet by mouth daily.   omeprazole 20 MG capsule Commonly known as:  PRILOSEC Take 20 mg by mouth daily.   potassium chloride SA 20 MEQ tablet Commonly known as:  K-DUR,KLOR-CON Take 1 tablet (20 mEq total) by mouth 2 (two) times daily.   pregabalin 75 MG capsule Commonly known as:  LYRICA Take 1 capsule (75 mg total) by mouth 2 (two) times daily.   protein supplement shake Liqd Commonly known as:  PREMIER PROTEIN Take 325 mLs (11 oz total) by  mouth 2 (two) times daily between meals.   rivaroxaban 20 MG Tabs tablet Commonly known as:  XARELTO Take 1 tablet (20 mg total) by mouth daily with supper.   saccharomyces boulardii 250 MG capsule Commonly known as:  FLORASTOR Take 1 capsule (250 mg total) by mouth 2 (two) times daily.   sacubitril-valsartan 24-26 MG Commonly known as:  ENTRESTO Take 1 tablet by mouth 2 (two) times daily.   tamsulosin 0.4 MG Caps capsule Commonly known as:  FLOMAX Take 1 capsule (0.4 mg total) by mouth daily after supper.      Follow-up Information    [name] Huddle, MD. Schedule an appointment as soon as possible for a visit in 1 week(s).   Specialty:  Internal Medicine Why:  Hospital follow up  Contact information: [address] [place] [address] [phone]        Baldwin Jamaica, PA-C. Schedule an appointment as soon as possible for a visit in 1 week(s).   Specialty:  Cardiology Why:  Hospital follow up  Contact information: [address] [place] [address] [phone]          No Known Allergies  Consultations: Oncology   Procedures/Studies: Dg Chest 2 View  Result Date: 01/02/2017 CLINICAL DATA:  Acute onset of shortness of breath and generalized weakness. Initial encounter. EXAM: CHEST  2 VIEW COMPARISON:  Chest radiograph performed 12/08/2016 FINDINGS: The lungs are well-aerated. Vascular congestion is noted. Increased interstitial markings raise concern for mild interstitial edema. Small bilateral pleural effusions are seen. No pneumothorax is identified. The heart is borderline enlarged. A pacemaker/AICD is noted at the left chest wall, with leads ending at the right atrium, right ventricle and coronary sinus. No acute osseous abnormalities are seen. IMPRESSION: Vascular congestion and borderline cardiomegaly. Increased interstitial markings raise concern for mild interstitial edema. Small bilateral pleural effusions seen.  Electronically Signed   By: Garald Balding M.D.   On: 01/02/2017 22:26   Dg Chest Port 1 View  Result Date: 01/07/2017 CLINICAL DATA:  Shortness of breath. EXAM: PORTABLE CHEST 1 VIEW COMPARISON:  01/02/2017 FINDINGS: Cardiac silhouette is mildly enlarged. No mediastinal or hilar masses. No convincing  adenopathy. There is lung base opacity, greater on the left, mildly improved from the prior exam. This is consistent with improved interstitial pulmonary edema. No new lung abnormalities. No pneumothorax. Left anterior chest wall biventricular cardioverter-defibrillator is stable. IMPRESSION: 1. Mild lung base opacity, greater on the left, which has mildly improved from the prior exam. This may be due to residual interstitial edema, infection or atelectasis. 2. No new abnormalities. Electronically Signed   By: Lajean Manes M.D.   On: 01/07/2017 18:27   Dg Cholangiogram  Existing Tube  Result Date: 12/31/2016 INDICATION: 71 year old male with a history of pancreatic cancer and who is been treated for acute cholecystitis 10/18/2016 with percutaneous cholecystostomy. The patient continues to record low volume levels of output of purulent appearing material though remains asymptomatic at this time. He is currently receiving care for his pancreatic malignancy. EXAM: DRAIN INJECTION MEDICATIONS: NONE ANESTHESIA/SEDATION: NONE FLUOROSCOPY TIME:  Fluoroscopy Time: 0 minutes 36 seconds (208 mGy). COMPLICATIONS: NONE PROCEDURE: Informed written consent was obtained from the patient after a thorough discussion of the procedural risks, benefits and alternatives. All questions were addressed. Maximal Sterile Barrier Technique was utilized including caps, mask, sterile gowns, sterile gloves, sterile drape, hand hygiene and skin antiseptic. A timeout was performed prior to the initiation of the procedure. Patient positioned supine position on the fluoroscopy table. The indwelling drain was injected under fluoroscopy with  images stored and sent to PACs. Catheter was then flushed and attached to gravity drainage. Patient tolerated the procedure well and remained hemodynamically stable throughout. No complications. IMPRESSION: Through the tube cholangiogram via percutaneous cholecystostomy into the gallbladder demonstrates the pigtail catheter remaining in the gallbladder lumen. Patency of the ductal system cannot be confirmed. Signed, Dulcy Fanny. Earleen Newport, DO [facility] PLAN: Given the volume and quality of the drainage, recommend leaving the tube with a planned tube exchanged in 3-4 months. Catheter should remain to gravity drainage. Should the patient be deemed a surgical candidate for interval cholecystectomy, the drain exchange can be deferred. Electronically Signed   By: Corrie Mckusick D.O.   On: 12/31/2016 12:11    Discharge Exam: Vitals:   01/07/17 2006 01/08/17 0551  BP: 99/65 105/69  Pulse: 87 81  Resp: 20 18  Temp: 98.9 F (37.2 C) 98.1 F (36.7 C)   Vitals:   01/07/17 1117 01/07/17 1249 01/07/17 2006 01/08/17 0551  BP: 102/61 (!) 113/58 99/65 105/69  Pulse: 83 84 87 81  Resp:  19 20 18   Temp: 98.7 F (37.1 C) 98.5 F (36.9 C) 98.9 F (37.2 C) 98.1 F (36.7 C)  TempSrc: Oral Oral Oral Oral  SpO2:  98% 99% 95%  Weight:    87.9 kg (193 lb 12.6 oz)  Height:        General: Pt is alert, awake, not in acute distress Cardiovascular: RRR, S1/S2 +, no rubs, no gallops Respiratory: CTA bilaterally, no wheezing, no rhonchi Abdominal: Soft, NT, ND, bowel sounds + Extremities: BKA, stomp normal, no edema    The results of significant diagnostics from this hospitalization (including imaging, microbiology, ancillary and laboratory) are listed below for reference.     Microbiology: Recent Results (from the past 240 hour(s))  Body fluid culture     Status: None   Collection Time: 01/05/17  2:07 PM  Result Value Ref Range Status   Specimen  Description GALL BLADDER  Final   Special Requests Immunocompromised  Final   Gram Stain   Final  RARE WBC PRESENT, PREDOMINANTLY PMN ABUNDANT GRAM NEGATIVE RODS    Culture   Final    ABUNDANT ESCHERICHIA COLI Confirmed Extended Spectrum Beta-Lactamase Producer (ESBL) Performed at [facility], [place] [address] [place], [place] [address]    Report Status 01/08/2017 FINAL  Final   Organism ID, Bacteria ESCHERICHIA COLI  Final      Susceptibility   Escherichia coli - MIC*    AMPICILLIN >=32 RESISTANT Resistant     CEFAZOLIN >=64 RESISTANT Resistant     CEFEPIME RESISTANT Resistant     CEFTAZIDIME RESISTANT Resistant     CEFTRIAXONE >=64 RESISTANT Resistant     CIPROFLOXACIN >=4 RESISTANT Resistant     GENTAMICIN >=16 RESISTANT Resistant     IMIPENEM <=0.25 SENSITIVE Sensitive     TRIMETH/SULFA <=20 SENSITIVE Sensitive     AMPICILLIN/SULBACTAM 16 INTERMEDIATE Intermediate     PIP/TAZO <=4 SENSITIVE Sensitive     Extended ESBL POSITIVE Resistant     * ABUNDANT ESCHERICHIA COLI  Culture, blood (Routine X 2) w Reflex to ID Panel     Status: None (Preliminary result)   Collection Time: 01/05/17  2:17 PM  Result Value Ref Range Status   Specimen Description BLOOD LEFT ARM  Final   Special Requests   Final    BOTTLES DRAWN AEROBIC AND ANAEROBIC Blood Culture adequate volume   Culture   Final    NO GROWTH 3 DAYS Performed at [facility], [address] [place], [place] [address]    Report Status PENDING  Incomplete  Culture, blood (Routine X 2) w Reflex to ID Panel     Status: None (Preliminary result)   Collection Time: 01/05/17  2:18 PM  Result Value Ref Range Status   Specimen Description BLOOD LEFT ARM  Final   Special Requests   Final    BOTTLES DRAWN AEROBIC AND ANAEROBIC Blood Culture adequate volume   Culture   Final    NO GROWTH 3 DAYS Performed at [facility], [address], [place], [place] [address]    Report Status PENDING  Incomplete      Labs: BNP (last 3 results)  Recent Labs  11/10/16 1101 01/02/17 2237 01/08/17 0521  BNP 866.4* 1,012.6* 1,027.2*   Basic Metabolic Panel:  Recent Labs Lab 01/04/17 0547 01/05/17 0453 01/06/17 0530 01/07/17 0531 01/08/17 0815  NA 141 137 140 137 135  K 4.1 4.6 4.8 4.9 4.9  CL 106 99* 99* 100* 99*  CO2 26 28 31 28 28   GLUCOSE 63* 248* 114* 169* 253*  BUN 21* 23* 21* 22* 22*  CREATININE 1.12 1.16 1.42* 1.43* 1.25*  CALCIUM 8.5* 8.6* 8.8* 8.4* 8.2*   Liver Function Tests:  Recent Labs Lab 01/03/17 0042 01/05/17 0453 01/07/17 0531  AST 16 58* 50*  ALT 19 32 56  ALKPHOS 131* 169* 208*  BILITOT 0.4 0.5 1.2  PROT 6.6 6.6 6.7  ALBUMIN 2.6* 2.5* 2.5*    Recent Labs Lab 01/03/17 0042  LIPASE 16   CBC:  Recent Labs Lab 01/02/17 2237 01/03/17 0518 01/05/17 0453 01/07/17 0531 01/08/17 0521  WBC 9.4 6.4 6.6 5.4 6.0  NEUTROABS 8.3*  --  5.3 4.2  --   HGB 9.0* 8.2* 9.5* 9.2* 9.4*  HCT 26.6* 25.4* 29.8* 28.9* 29.0*  MCV 73.5* 73.8* 74.5* 75.1* 73.8*  PLT 340 305 327 269 282   CBG:  Recent Labs Lab 01/07/17 1158 01/07/17 1650 01/07/17 2113 01/08/17 0756 01/08/17 1131  GLUCAP  294* 293* 253* 234* 280*    Urinalysis    Component Value Date/Time   COLORURINE AMBER (A) 12/08/2016 1131   APPEARANCEUR TURBID (A) 12/08/2016 1131   LABSPEC 1.015 12/08/2016 1131   PHURINE 5.0 12/08/2016 1131   GLUCOSEU 150 (A) 12/08/2016 1131   HGBUR MODERATE (A) 12/08/2016 1131   BILIRUBINUR NEGATIVE 12/08/2016 1131   KETONESUR 5 (A) 12/08/2016 1131   PROTEINUR 100 (A) 12/08/2016 1131   UROBILINOGEN 0.2 01/11/2015 1850   NITRITE NEGATIVE 12/08/2016 1131   LEUKOCYTESUR LARGE (A) 12/08/2016 1131   Sepsis Labs Invalid input(s): PROCALCITONIN,  WBC,  LACTICIDVEN Microbiology Recent Results (from the past 240 hour(s))  Body fluid culture     Status: None   Collection Time: 01/05/17  2:07 PM  Result Value Ref Range Status   Specimen Description GALL BLADDER  Final    Special Requests Immunocompromised  Final   Gram Stain   Final    RARE WBC PRESENT, PREDOMINANTLY PMN ABUNDANT GRAM NEGATIVE RODS    Culture   Final    ABUNDANT ESCHERICHIA COLI Confirmed Extended Spectrum Beta-Lactamase Producer (ESBL) Performed at [facility], [place] [address] [place], [place] [address]    Report Status 01/08/2017 FINAL  Final   Organism ID, Bacteria ESCHERICHIA COLI  Final      Susceptibility   Escherichia coli - MIC*    AMPICILLIN >=32 RESISTANT Resistant     CEFAZOLIN >=64 RESISTANT Resistant     CEFEPIME RESISTANT Resistant     CEFTAZIDIME RESISTANT Resistant     CEFTRIAXONE >=64 RESISTANT Resistant     CIPROFLOXACIN >=4 RESISTANT Resistant     GENTAMICIN >=16 RESISTANT Resistant     IMIPENEM <=0.25 SENSITIVE Sensitive     TRIMETH/SULFA <=20 SENSITIVE Sensitive     AMPICILLIN/SULBACTAM 16 INTERMEDIATE Intermediate     PIP/TAZO <=4 SENSITIVE Sensitive     Extended ESBL POSITIVE Resistant     * ABUNDANT ESCHERICHIA COLI  Culture, blood (Routine X 2) w Reflex to ID Panel     Status: None (Preliminary result)   Collection Time: 01/05/17  2:17 PM  Result Value Ref Range Status   Specimen Description BLOOD LEFT ARM  Final   Special Requests   Final    BOTTLES DRAWN AEROBIC AND ANAEROBIC Blood Culture adequate volume   Culture   Final    NO GROWTH 3 DAYS Performed at [facility], [address] [place], [place] [address]    Report Status PENDING  Incomplete  Culture, blood (Routine X 2) w Reflex to ID Panel     Status: None (Preliminary result)   Collection Time: 01/05/17  2:18 PM  Result Value Ref Range Status   Specimen Description BLOOD LEFT ARM  Final   Special Requests   Final    BOTTLES DRAWN AEROBIC AND ANAEROBIC Blood Culture adequate volume   Culture   Final    NO GROWTH 3 DAYS Performed at [facility], [address], [place], [place] [address]    Report Status PENDING  Incomplete     Time coordinating discharge: 35  minutes  SIGNED:  Chipper Oman, MD  [facility] 01/08/2017, 1:23 PM  Pager please text page via  [email or web address] Password [email or web address]

## 2017-01-08 NOTE — Progress Notes (Signed)
IP PROGRESS NOTE  Subjective:   He was admitted 01/03/2017 with dyspnea. He denies dyspnea, nausea, and pain at present. No diarrhea.   Objective: Vital signs in last 24 hours: Blood pressure 105/69, pulse 81, temperature 98.1 F (36.7 C), temperature source Oral, resp. rate 18, height 5\' 9"  (1.753 m), weight 193 lb 12.6 oz (87.9 kg), SpO2 95 %.  Intake/Output from previous day: 05/16 0701 - 05/17 0700 In: 740 [P.O.:720] Out: 1850 [Urine:1850]  Physical Exam:  Abdomen: No hepatosplenomegaly, no mass, nontender, right upper quadrant biliary drain with a gauze dressing Extremities: No leg edema     Lab Results:  Recent Labs  01/07/17 0531 01/08/17 0521  WBC 5.4 6.0  HGB 9.2* 9.4*  HCT 28.9* 29.0*  PLT 269 282    BMET  Recent Labs  01/06/17 0530 01/07/17 0531  NA 140 137  K 4.8 4.9  CL 99* 100*  CO2 31 28  GLUCOSE 114* 169*  BUN 21* 22*  CREATININE 1.42* 1.43*  CALCIUM 8.8* 8.4*    Studies/Results: Dg Chest Port 1 View  Result Date: 01/07/2017 CLINICAL DATA:  Shortness of breath. EXAM: PORTABLE CHEST 1 VIEW COMPARISON:  01/02/2017 FINDINGS: Cardiac silhouette is mildly enlarged. No mediastinal or hilar masses. No convincing adenopathy. There is lung base opacity, greater on the left, mildly improved from the prior exam. This is consistent with improved interstitial pulmonary edema. No new lung abnormalities. No pneumothorax. Left anterior chest wall biventricular cardioverter-defibrillator is stable. IMPRESSION: 1. Mild lung base opacity, greater on the left, which has mildly improved from the prior exam. This may be due to residual interstitial edema, infection or atelectasis. 2. No new abnormalities. Electronically Signed   By: Lajean Manes M.D.   On: 01/07/2017 18:27    Medications: I have reviewed the patient's current medications.  Assessment/Plan: 1. Pancreas cancer, pancreas body mass on CT 12/08/2016  CT 12/08/2016 consistent with a pancreas body  mass and liver lesions suspicious for metastases  EUS on 12/15/2016 confirmed a pancreas body mass, T3 N0, biopsy consistent with adenocarcinoma  2.   Acute cholecystitis February 2018, status post placement of a cholecystostomy tube  3.  Diabetes  4.  Congestive heart failure  5.  Peripheral vascular disease, status post bilateral BKA  6.  Atrial fibrillation  7.  Microcytic anemia  8.  C. difficile colitis April 2018  9.  Chronic renal failure  10. Anorectal hyperenhancing lesion noted on CT 12/08/2016  He was admitted with dyspnea, likely related to congestive heart failure. His symptoms have improved. He had a low-grade fever on admission of unclear etiology.  Mr. Cabello has been diagnosed with pancreas cancer. He has 2 liver lesions that are suspicious, but not definitive for metastatic disease. I presented his case at the GI tumor conference on 12/31/2016. The opinion from the conference group is to follow him with observation versus consider a course of radiation.  I discussed options with Mr. Gaertner and his wife yesterday. I discussed the situation with Mr. Elison again this morning. I will arrange [facility]. He would need to be taken off of anticoagulation therapy in order to undergo a liver biopsy. We can consider obtaining an abdominal ultrasound to see if the liver lesions are visible and appear as metastases. I do not recommend a biopsy at present.  He will be scheduled for an appointment at the [facility] within the next 2-3 weeks. We will make a referral to radiation oncology.  LOS: 5 days   Betsy Coder, MD   01/08/2017, 8:31 AM

## 2017-01-08 NOTE — Discharge Instructions (Signed)

## 2017-01-08 NOTE — Progress Notes (Signed)
Inpatient Diabetes Program Recommendations  AACE/ADA: New Consensus Statement on Inpatient Glycemic Control (2015)  Target Ranges:  Prepandial:   less than 140 mg/dL      Peak postprandial:   less than 180 mg/dL (1-2 hours)      Critically ill patients:  140 - 180 mg/dL   Lab Results  Component Value Date   GLUCAP 234 (H) 01/08/2017   HGBA1C 10.5 (H) 01/03/2017    Review of Glycemic Control  Blood sugars consistently in 200s. Needs insulin adjustment.  Inpatient Diabetes Program Recommendations:    Increase Lantus to 18 units QHS Add Novolog 4 units tidwc for meal coverage insulin while inpatient.  Continue to follow.  Thank you. Lorenda Peck, RD, LDN, CDE Inpatient Diabetes Coordinator [phone]

## 2017-01-08 NOTE — Progress Notes (Signed)
Home health orders faxed to [facility] [phone]). Marney Doctor RN,BSN,NCM [phone]

## 2017-01-08 NOTE — Progress Notes (Signed)
ANTICOAGULATION CONSULT NOTE - Follow up  Pharmacy Consult for warfarin --> Xarelto Indication: hx atrial fibrillation  No Known Allergies  Patient Measurements: Height: 5\' 9"  (175.3 cm) Weight: 193 lb 12.6 oz (87.9 kg) IBW/kg (Calculated) : 70.7  Vital Signs: Temp: 98.1 F (36.7 C) (05/17 0551) Temp Source: Oral (05/17 0551) BP: 105/69 (05/17 0551) Pulse Rate: 81 (05/17 0551)  Labs:  Recent Labs  01/06/17 0530 01/07/17 0531 01/08/17 0521 01/08/17 0815  HGB  --  9.2* 9.4*  --   HCT  --  28.9* 29.0*  --   PLT  --  269 282  --   LABPROT 31.3* 45.2* 31.4*  --   INR 2.94 4.65* 2.96  --   CREATININE 1.42* 1.43*  --  1.25*    Estimated Creatinine Clearance: 57.8 mL/min (A) (by C-G formula based on SCr of 1.25 mg/dL (H)).  Medications: Scheduled:  . atorvastatin  10 mg Oral Daily  . feeding supplement (ENSURE ENLIVE)  237 mL Oral BID BM  . insulin aspart  0-15 Units Subcutaneous TID WC  . insulin aspart  0-5 Units Subcutaneous QHS  . insulin glargine  15 Units Subcutaneous QHS  . iron polysaccharides  150 mg Oral Daily  . metoprolol succinate  25 mg Oral Daily  . potassium chloride  20 mEq Oral BID  . pregabalin  75 mg Oral BID  . protein supplement shake  11 oz Oral BID BM  . rivaroxaban  20 mg Oral Q supper  . saccharomyces boulardii  250 mg Oral BID  . sacubitril-valsartan  1 tablet Oral BID  . 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  Pharmacy initially consulted to continue dosing warfarin on admission. Warfarin changed to Xarelto on 5/15 per MD.  Today, 01/08/2017: - INR down to 2.96 today - cbc relatively stable - no bleeding documented - scr down 1.25 (crcl~65 with TBW)   Plan:  - will start xarelto 20 mg PO daily today since INR <3 -  monitor for s/s bleeding   Dia Sitter, PharmD, BCPS 01/08/2017 12:22 PM

## 2017-01-08 NOTE — Care Management Important Message (Signed)
Important Message  Patient Details  Name: [name] [name] MRN: [id number] Date of Birth: Jan 09, 1946   Medicare Important Message Given:  Yes    [name] 01/08/2017, 10:56 AMImportant Message  Patient Details  Name: [name] [name] MRN: [id number] Date of Birth: 12-Jun-1946   Medicare Important Message Given:  Yes    [name] 01/08/2017, 10:56 AM

## 2017-01-10 LAB — CULTURE, BLOOD (ROUTINE X 2)
CULTURE: NO GROWTH
CULTURE: NO GROWTH
Special Requests: ADEQUATE
Special Requests: ADEQUATE

## 2017-01-12 ENCOUNTER — Telehealth: Payer: Self-pay | Admitting: *Deleted

## 2017-01-12 ENCOUNTER — Other Ambulatory Visit (HOSPITAL_COMMUNITY): Payer: Self-pay | Admitting: Diagnostic Radiology

## 2017-01-12 DIAGNOSIS — K819 Cholecystitis, unspecified: Secondary | ICD-10-CM

## 2017-01-12 NOTE — Telephone Encounter (Signed)
Message from pt's wife re: appts. Returned call, informed her Dr. Benay Spice wants to see him in 2-3 weeks. Schedulers will call with appt. She voiced understanding.

## 2017-01-13 ENCOUNTER — Telehealth: Payer: Self-pay | Admitting: *Deleted

## 2017-01-13 ENCOUNTER — Other Ambulatory Visit: Payer: Self-pay | Admitting: General Surgery

## 2017-01-13 DIAGNOSIS — K810 Acute cholecystitis: Secondary | ICD-10-CM

## 2017-01-13 NOTE — Telephone Encounter (Signed)
Message received from patient's wife requesting appt time for patient.  Call placed back to patient's wife to inform her that scheduler would be calling her this week with appt time for new pt appt.  Patient's wife appreciative of call back.

## 2017-01-14 ENCOUNTER — Ambulatory Visit (HOSPITAL_COMMUNITY)
Admission: RE | Admit: 2017-01-14 | Discharge: 2017-01-14 | Disposition: A | Discharge status: Home / Self Care | Payer: Medicare Other | Source: Ambulatory Visit | Attending: General Surgery | Admitting: General Surgery

## 2017-01-14 ENCOUNTER — Other Ambulatory Visit: Payer: Medicare Other

## 2017-01-14 ENCOUNTER — Encounter (HOSPITAL_COMMUNITY): Payer: Self-pay | Admitting: Interventional Radiology

## 2017-01-14 DIAGNOSIS — Z4803 Encounter for change or removal of drains: Secondary | ICD-10-CM | POA: Insufficient documentation

## 2017-01-14 DIAGNOSIS — K810 Acute cholecystitis: Secondary | ICD-10-CM

## 2017-01-14 HISTORY — PX: IR SINUS/FIST TUBE CHK-NON GI: IMG673

## 2017-01-14 MED ORDER — LIDOCAINE HCL 1 % IJ SOLN
INTRAMUSCULAR | Status: AC
  Filled 2017-01-14: qty 20

## 2017-01-14 MED ORDER — IOPAMIDOL (ISOVUE-300) INJECTION 61%
INTRAVENOUS | Status: AC
  Administered 2017-01-14: 10 mL
  Filled 2017-01-14: qty 50

## 2017-01-22 ENCOUNTER — Telehealth: Payer: Self-pay | Admitting: Oncology

## 2017-01-22 NOTE — Telephone Encounter (Signed)
lvm to inform pt of 6/11 appt at 0800 per sch msg

## 2017-01-26 NOTE — Anesthesia Postprocedure Evaluation (Signed)
Anesthesia Post Note  [name]  Procedure(s) Performed: Procedure(s) (LRB): UPPER ENDOSCOPIC ULTRASOUND (EUS) LINEAR (Left)     Anesthesia Post Evaluation  Last Vitals:  Vitals:   12/15/16 2150 12/16/16 0534  BP: 107/69 107/69  Pulse: 96 95  Resp: 16 16  Temp: 37 C 36.7 C    Last Pain:  Vitals:   12/16/16 0925  TempSrc:   PainSc: 0-No pain                 Moosa Bueche S

## 2017-01-26 NOTE — Addendum Note (Signed)
Addendum  created 01/26/17 1337 by Myrtie Soman, MD   Sign clinical note

## 2017-02-02 ENCOUNTER — Ambulatory Visit: Payer: Medicare Other | Admitting: Oncology

## 2017-02-02 ENCOUNTER — Telehealth: Payer: Self-pay | Admitting: *Deleted

## 2017-02-02 NOTE — Telephone Encounter (Signed)
Received fax from [facility] after hour call service: Enid Derry called to cancel this morning's appt.  Manson Allan, she reports he seems to be doing better this afternoon. He was too weak to come in this morning. She request an appt mid-morning or afternoon appointment.  Will review with MD for a slot.

## 2017-02-03 NOTE — Telephone Encounter (Signed)
Next 2 weeks, needs radiation appt.

## 2017-02-04 ENCOUNTER — Telehealth: Payer: Self-pay | Admitting: Oncology

## 2017-02-04 NOTE — Telephone Encounter (Signed)
Rescheduled 02/02/17 follow up appointment, per voicemail received. Appointment rescheduled for next available date of 02/09/17. Left message on patient's voicemail, with appointment detail. Appointment letter and schedule was also mailed to patient.

## 2017-02-05 ENCOUNTER — Other Ambulatory Visit: Payer: Self-pay | Admitting: *Deleted

## 2017-02-05 DIAGNOSIS — C259 Malignant neoplasm of pancreas, unspecified: Secondary | ICD-10-CM

## 2017-02-05 NOTE — Telephone Encounter (Signed)
RadOnc referral entered. Pt has been rescheduled to 6/19.

## 2017-02-06 ENCOUNTER — Encounter: Payer: Self-pay | Admitting: General Surgery

## 2017-02-06 ENCOUNTER — Encounter: Payer: Self-pay | Admitting: Radiation Oncology

## 2017-02-09 ENCOUNTER — Ambulatory Visit (HOSPITAL_BASED_OUTPATIENT_CLINIC_OR_DEPARTMENT_OTHER): Payer: Medicare Other | Admitting: Nurse Practitioner

## 2017-02-09 ENCOUNTER — Telehealth: Payer: Self-pay | Admitting: Nurse Practitioner

## 2017-02-09 VITALS — BP 109/46 | HR 90 | Temp 97.4°F | Resp 17 | Ht 69.0 in | Wt 179.8 lb

## 2017-02-09 DIAGNOSIS — N189 Chronic kidney disease, unspecified: Secondary | ICD-10-CM | POA: Diagnosis not present

## 2017-02-09 DIAGNOSIS — K769 Liver disease, unspecified: Secondary | ICD-10-CM | POA: Diagnosis not present

## 2017-02-09 DIAGNOSIS — C251 Malignant neoplasm of body of pancreas: Secondary | ICD-10-CM

## 2017-02-09 DIAGNOSIS — I739 Peripheral vascular disease, unspecified: Secondary | ICD-10-CM

## 2017-02-09 DIAGNOSIS — D509 Iron deficiency anemia, unspecified: Secondary | ICD-10-CM

## 2017-02-09 DIAGNOSIS — E119 Type 2 diabetes mellitus without complications: Secondary | ICD-10-CM | POA: Diagnosis not present

## 2017-02-09 DIAGNOSIS — Z89512 Acquired absence of left leg below knee: Secondary | ICD-10-CM

## 2017-02-09 DIAGNOSIS — K6289 Other specified diseases of anus and rectum: Secondary | ICD-10-CM

## 2017-02-09 DIAGNOSIS — C259 Malignant neoplasm of pancreas, unspecified: Secondary | ICD-10-CM

## 2017-02-09 DIAGNOSIS — Z89511 Acquired absence of right leg below knee: Secondary | ICD-10-CM

## 2017-02-09 DIAGNOSIS — I509 Heart failure, unspecified: Secondary | ICD-10-CM

## 2017-02-09 NOTE — Patient Instructions (Signed)
Abdominal ultrasound on 6/21 to follow-up liver lesions Lab work on 6/21 Return to see Dr Benay Spice on 7/24

## 2017-02-09 NOTE — Telephone Encounter (Signed)
Scheduled appt per 6/18 los - Gave patient AVS and calender per LOS.  

## 2017-02-09 NOTE — Progress Notes (Addendum)
  [facility] OFFICE PROGRESS NOTE   Diagnosis:  Pancreas cancer  INTERVAL HISTORY:   [name] [name] returns for follow-up. He was hospitalized with CHF in May. He was scheduled to be seen last week but was too weak to come in for that appointment. He reports he is feeling better. He is working with physical therapy. He denies pain. No nausea or vomiting. No significant diarrhea. He reports a good appetite. No fever. Objective:  Vital signs in last 24 hours:  Blood pressure (!) 109/46, pulse 90, temperature 97.4 F (36.3 C), temperature source Oral, resp. rate 17, height 5\' 9"  (1.753 m), weight 179 lb 12.8 oz (81.6 kg), SpO2 99 %.    HEENT: No thrush or ulcers. Resp: Lungs clear bilaterally. Cardio: Regular rate and rhythm. GI: Abdomen soft and nontender. No hepatomegaly. Right upper quadrant percutaneous cholecystostomy catheter. Vascular: Bilateral below-knee amputation.   Lab Results:  Lab Results  Component Value Date   WBC 6.0 01/08/2017   HGB 9.4 (L) 01/08/2017   HCT 29.0 (L) 01/08/2017   MCV 73.8 (L) 01/08/2017   PLT 282 01/08/2017   NEUTROABS 4.2 01/07/2017    Imaging:  No results found.  Medications: I have reviewed the patient's current medications.  Assessment/Plan: 1. Pancreas cancer, pancreas body mass on CT 12/08/2016  CT 12/08/2016 consistent with a pancreas body mass and liver lesions suspicious for metastases  EUS on 12/15/2016 confirmed a pancreas body mass, T3 N0, biopsy consistent with adenocarcinoma  2.   Acute cholecystitis February 2018, status post placement of a cholecystostomy tube  3.  Diabetes  4.  Congestive heart failure; hospitalized May 2018 with CHF  5.  Peripheral vascular disease, status post bilateral BKA  6.  Atrial fibrillation  7.  Microcytic anemia  8.  C. difficile colitis April 2018  9.  Chronic renal failure  10. Anorectal hyperenhancing lesion noted on CT 12/08/2016   Disposition: [name] appears stable. Dr. Benay Spice presented his case at the tumor conference. He is not felt to be a candidate for surgery. He may be a candidate for radiation. He is scheduled to see radiation oncology later this week. We are referring him for an abdominal ultrasound to follow-up the liver lesions, question metastases. We will obtain a follow-up CA-19-9.  He will return for a follow-up visit in approximately one month, sooner pending plan from radiation oncology.  Patient seen with Dr. Benay Spice.  Ned Card ANP/GNP-BC   02/09/2017  3:43 PM  This was a shared visit with Ned Card. [name] [name] appears asymptomatic from the pancreas cancer. He is not a surgical candidate. We will refer him for an abdominal ultrasound. Radiation will be considered if there are no liver metastases seen on ultrasound.  If radiation is not indicated I recommend observation.  Julieanne Manson, M.D.

## 2017-02-10 NOTE — Progress Notes (Addendum)
GI Location of Tumor / Histology: Pancreas,   [name] [name] presented  months ago with symptoms of:   Biopsies of(if applicable) revealed:EUS 12/15/16 =pancreatic mass, bx =adenocarcinoma  Past/Anticipated interventions by surgeon, if any: not a surgical candidate, [name] [name] EUS on 12/15/16 w/bx  Past/Anticipated interventions by medical oncology, if any: Dr. Benay Spice ,MD  Weight changes, if any: loss 8 lbs  Bowel/Bladder complaints, if any: biliary drain ruq, patient pulled out during the night needs to be placed back in today  Nausea / Vomiting, if any: NO  Pain issues, if any:  No  Any blood per rectum:   No   SAFETY ISSUES: YES, B/L  BKA has b/l prosthetics, loss  Of limbs 4 years ago due to diabetes  Prior radiation? NO  Pacemaker/ICD?  Yes, defibrillator  Is the patient on methotrexate?  NO  Current Complaints/Details:recent CHF May 2018., Chronic renal failure, A-Fib, PVD,DM, acute cholecystitis 09/2016 ,s/p placement cholecystostomy tube    12/08/16 CT=Anorectal hyper enhancing lesion,  99.2, 120/52,100,18, 98% room air  Wt Readings from Last 3 Encounters:  02/09/17 179 lb 12.8 oz (81.6 kg)  01/08/17 193 lb 12.6 oz (87.9 kg)  12/29/16 196 lb 11.2 oz (89.2 kg)

## 2017-02-12 ENCOUNTER — Other Ambulatory Visit (HOSPITAL_BASED_OUTPATIENT_CLINIC_OR_DEPARTMENT_OTHER): Payer: Medicare Other

## 2017-02-12 ENCOUNTER — Ambulatory Visit
Admission: RE | Admit: 2017-02-12 | Discharge: 2017-02-12 | Disposition: A | Discharge status: Home / Self Care | Payer: Medicare Other | Source: Ambulatory Visit | Attending: Radiation Oncology | Admitting: Radiation Oncology

## 2017-02-12 ENCOUNTER — Encounter: Payer: Self-pay | Admitting: Radiation Oncology

## 2017-02-12 VITALS — BP 120/52 | HR 100 | Temp 99.2°F | Resp 18 | Ht 69.0 in

## 2017-02-12 DIAGNOSIS — E1122 Type 2 diabetes mellitus with diabetic chronic kidney disease: Secondary | ICD-10-CM | POA: Insufficient documentation

## 2017-02-12 DIAGNOSIS — K219 Gastro-esophageal reflux disease without esophagitis: Secondary | ICD-10-CM | POA: Insufficient documentation

## 2017-02-12 DIAGNOSIS — Z95810 Presence of automatic (implantable) cardiac defibrillator: Secondary | ICD-10-CM | POA: Insufficient documentation

## 2017-02-12 DIAGNOSIS — C259 Malignant neoplasm of pancreas, unspecified: Secondary | ICD-10-CM

## 2017-02-12 DIAGNOSIS — N183 Chronic kidney disease, stage 3 (moderate): Secondary | ICD-10-CM | POA: Insufficient documentation

## 2017-02-12 DIAGNOSIS — G629 Polyneuropathy, unspecified: Secondary | ICD-10-CM | POA: Diagnosis not present

## 2017-02-12 DIAGNOSIS — I429 Cardiomyopathy, unspecified: Secondary | ICD-10-CM | POA: Diagnosis not present

## 2017-02-12 DIAGNOSIS — I447 Left bundle-branch block, unspecified: Secondary | ICD-10-CM | POA: Insufficient documentation

## 2017-02-12 DIAGNOSIS — Z794 Long term (current) use of insulin: Secondary | ICD-10-CM | POA: Insufficient documentation

## 2017-02-12 DIAGNOSIS — Z89512 Acquired absence of left leg below knee: Secondary | ICD-10-CM | POA: Insufficient documentation

## 2017-02-12 DIAGNOSIS — I480 Paroxysmal atrial fibrillation: Secondary | ICD-10-CM | POA: Diagnosis not present

## 2017-02-12 DIAGNOSIS — K819 Cholecystitis, unspecified: Secondary | ICD-10-CM

## 2017-02-12 DIAGNOSIS — Z7901 Long term (current) use of anticoagulants: Secondary | ICD-10-CM | POA: Insufficient documentation

## 2017-02-12 DIAGNOSIS — C251 Malignant neoplasm of body of pancreas: Secondary | ICD-10-CM

## 2017-02-12 DIAGNOSIS — F0390 Unspecified dementia without behavioral disturbance: Secondary | ICD-10-CM | POA: Diagnosis not present

## 2017-02-12 DIAGNOSIS — Z809 Family history of malignant neoplasm, unspecified: Secondary | ICD-10-CM

## 2017-02-12 DIAGNOSIS — Z87442 Personal history of urinary calculi: Secondary | ICD-10-CM | POA: Diagnosis not present

## 2017-02-12 DIAGNOSIS — Z89511 Acquired absence of right leg below knee: Secondary | ICD-10-CM | POA: Insufficient documentation

## 2017-02-12 DIAGNOSIS — I130 Hypertensive heart and chronic kidney disease with heart failure and stage 1 through stage 4 chronic kidney disease, or unspecified chronic kidney disease: Secondary | ICD-10-CM | POA: Diagnosis not present

## 2017-02-12 DIAGNOSIS — I2510 Atherosclerotic heart disease of native coronary artery without angina pectoris: Secondary | ICD-10-CM | POA: Diagnosis not present

## 2017-02-12 DIAGNOSIS — E7800 Pure hypercholesterolemia, unspecified: Secondary | ICD-10-CM | POA: Insufficient documentation

## 2017-02-12 LAB — FERRITIN: FERRITIN: 148 ng/mL (ref 22–316)

## 2017-02-12 NOTE — Progress Notes (Signed)
Radiation Oncology         [phone] ________________________________  Name: [name] [name] MRN: [id number]  Date: 02/12/2017  DOB: 10-12-1945  DX:IPJAS, [name] [name] [place], MD  Ladell Pier, MD     REFERRING PHYSICIAN: Ladell Pier, MD   DIAGNOSIS: The primary encounter diagnosis was Pancreatic adenocarcinoma [facility]). A diagnosis of Cholecystitis s/p perc cholecystostomy tube 10/18/2016 was also pertinent to this visit.   HISTORY OF PRESENT ILLNESS: [name] [name] is a 71 y.o. male seen at the request of Dr. Benay Spice for a new diagnosis of pancreas cancer. The patient presented in February 2018 to the ED with symptoms of abdominal pain, and was found to have acute cholecystitis. Given his history of CHF he could not undergo surgery and had a cholecystotomy tube placed. He has been readmitted for anorexia, weight loss, and malfunction of the C tube. He was found during his hospitalization in April 2018 to have a 13 mm subcapsular lesion in the left liver, and a hypovascular change in the hepatic dome. A 2.1 x 2.5 cm mass in the body of the pancreas was seen as well as a 1.67 cm lesion in the anorectal junction without adenopathy. He underwent EUS on 12/15/16 with Dr. Paulita Fujita and no liver lesions were seen but the mass in the pancreas was a T3N0 lesion, and biopsies confirmed adenocarcinoma. He was seen and offered hospice, but sought a second opinion with Dr. Benay Spice and is undergoing more work up. His CA 19-9 has been ordered, and he comes today to discuss options of radiotherapy as he will be contemplating chemoRT versus possibility for SBRT.  PREVIOUS RADIATION THERAPY: No   PAST MEDICAL HISTORY:  Past Medical History:  Diagnosis Date  . Anemia    takes Iron pill daily  . Arthritis   . Automatic implantable cardioverter-defibrillator in situ    a. 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  . CAD (coronary artery disease)   . Cardiomyopathy, nonischemic  ([place])    a. Cath 2003: mild nonobstructive CAD, EF 25% at that time.  . Chronic systolic CHF (congestive heart failure) (HCC)    takes Furosemide daily as well as Aldactone  . CKD (chronic kidney disease), stage III   . Complication of anesthesia   . Constipation    takes Sennokot daily  . Dementia   . Gangrenous toe, Lt toe 09/21/2013  . GERD (gastroesophageal reflux disease)    takes Nexium daily  . Headache    occasionally  . High cholesterol    takes Atorvastatin daily  . History of blood transfusion    no abnormal reaction noted  . History of bronchitis    > 1 yr ago  . History of kidney stones   . Hypertension    takes Coreg daily  . LBBB (left bundle branch block)    takes Coumadin daily  . NSVT (nonsustained ventricular tachycardia) ([place])    a. Noted on ICD interrogation in 2011.  [name] PAD (peripheral artery disease) ([place])    a. 08/2013 Periph Angio/PTA: Abd Ao nl, RLE- 3v runoff, PT diff dzs, AT 90p, LLE 2v runoff, PT 100, AT 65m (diamondback ORA/chocolate balloon PTA).  [name] PAF (paroxysmal atrial fibrillation) ([place])    a. Noted on ICD interrogation 2012;  b. coumadin d/c'd 01/2013.  [name] PAF (paroxysmal atrial fibrillation) ([place])   . Peripheral neuropathy    hands;numbness and tingling   . Pneumonia    hx of > 17yr ago  . PONV (  postoperative nausea and vomiting)   . Presence of permanent cardiac pacemaker   . Type II diabetes mellitus ([place])    takes Levemir daily as well as Novolog  . Urinary frequency   . Urinary urgency        PAST SURGICAL HISTORY: Past Surgical History:  Procedure Laterality Date  . ABDOMINAL ANGIOGRAM  09/12/2013   Procedure: ABDOMINAL ANGIOGRAM;  Surgeon: Lorretta Harp, MD;  Location: [facility] CATH LAB;  Service: Cardiovascular;;  . AMPUTATION Left 10/04/2013   Procedure: LEFT GREAT TOE AND SECOND TOE AMPUTATION;  Surgeon: Marianna Payment, MD;  Location: [place];  Service: Orthopedics;  Laterality: Left;  . AMPUTATION Left 11/09/2013   Procedure:  LEFT AMPUTATION BELOW KNEE;  Surgeon: Marianna Payment, MD;  Location: [place];  Service: Orthopedics;  Laterality: Left;  . AMPUTATION Right 01/04/2014   Procedure: Doristine Devoid second and third toe amputation;  Surgeon: Marianna Payment, MD;  Location: [place];  Service: Orthopedics;  Laterality: Right;  . AMPUTATION Right 01/30/2014   Procedure: RIGHT BELOW KNEE AMPUTATION;  Surgeon: Marianna Payment, MD;  Location: [place];  Service: Orthopedics;  Laterality: Right;  . ATHERECTOMY Right 12/29/2013   Procedure: ATHERECTOMY;  Surgeon: Lorretta Harp, MD;  Location: [facility] CATH LAB;  Service: Cardiovascular;  Laterality: Right;  Anterior Tibial Artery  . CARDIAC CATHETERIZATION  10/01/2001   THERE WAS GLOBAL HYPOKINESIS AND EF 25%. THERE APPEARED TO BE GLOBAL DECREASE IN WALL MOTION  . CARDIAC DEFIBRILLATOR PLACEMENT  06/2009   WITH GENERATOR REPLACED; BiV ICD  . CARDIOVASCULAR STRESS TEST  03/20/2009   EF 33%  . [place]  . COLONOSCOPY    . ESOPHAGOGASTRODUODENOSCOPY    . EUS Left 12/15/2016   Procedure: UPPER ENDOSCOPIC ULTRASOUND (EUS) LINEAR;  Surgeon: Arta Silence, MD;  Location: [facility];  Service: Endoscopy;  Laterality: Left;  . HIP ARTHROPLASTY Left 12/28/2015   Procedure: POSTERIOR  APPROACH HEMI HIP ARTHROPLASTY;  Surgeon: Leandrew Koyanagi, MD;  Location: [place];  Service: Orthopedics;  Laterality: Left;  . IR CATHETER TUBE CHANGE  12/08/2016  . IR GENERIC HISTORICAL  10/18/2016   IR PERC CHOLECYSTOSTOMY 10/18/2016 [name], MD [facility]-INTER[facility] RAD  . IR RADIOLOGIST EVAL & MGMT  11/20/2016  . IR RADIOLOGIST EVAL & MGMT  12/31/2016  . IR SINUS/FIST TUBE CHK-NON GI  01/14/2017  . LEG AMPUTATION BELOW KNEE Left 11/09/2013   DR Erlinda Hong  . LITHOTRIPSY  2001  . LOWER EXTREMITY ANGIOGRAM Bilateral 09/12/2013   Procedure: LOWER EXTREMITY ANGIOGRAM;  Surgeon: Lorretta Harp, MD;  Location[facility] CATH LAB;  Service: Cardiovascular;  Laterality: Bilateral;  . LOWER EXTREMITY ANGIOGRAM N/A 12/29/2013    Procedure: LOWER EXTREMITY ANGIOGRAM;  Surgeon: Lorretta Harp, MD;  Location: [facility] CATH LAB;  Service: Cardiovascular;  Laterality: N/A;  . STUMP REVISION Left 01/03/2015   Procedure: LEFT BELOW KNEE AMPUTATION REVISION;  Surgeon: Leandrew Koyanagi, MD;  Location: [place];  Service: Orthopedics;  Laterality: Left;  . TOE AMPUTATION  10/04/2013   LEFT GREAT TOE AND 4TH TOE   /   DR Erlinda Hong  . TRANSLUMINAL ATHERECTOMY TIBIAL ARTERY Left 09/12/2013  . US ECHOCARDIOGRAPHY  03/21/2008   EF 30-35%     FAMILY HISTORY:  Family History  Problem Relation Age of Onset  . Heart disease Mother   . Hypertension Mother   . Diabetes Mother   . Diabetes Father   . Ovarian cancer Sister  SOCIAL HISTORY:  reports that he has never smoked. He has never used smokeless tobacco. He reports that he does not drink alcohol or use drugs. The patient is married and lives in [place]. He retired from working in [employer].   ALLERGIES: Patient has no known allergies.   MEDICATIONS:  Current Outpatient Prescriptions  Medication Sig Dispense Refill  . acetaminophen (TYLENOL) 325 MG tablet Take 2 tablets (650 mg total) by mouth every 6 (six) hours as needed for mild pain (or Fever >/= 101).    [name] alum & mag hydroxide-simeth (MAALOX/MYLANTA) 200-200-20 MG/5ML suspension Take 15 mLs by mouth every 4 (four) hours as needed for indigestion or heartburn. 355 mL 0  . atorvastatin (LIPITOR) 10 MG tablet Take 1 tablet (10 mg total) by mouth daily. 30 tablet 1  . carvedilol (COREG) 6.25 MG tablet Take 1 tablet (6.25 mg total) by mouth 2 (two) times daily with a meal. 60 tablet 0  . cholecalciferol (VITAMIN D) 1000 units tablet Take 1,000 Units by mouth daily.    . furosemide (LASIX) 20 MG tablet Take 1 tablet (20 mg total) by mouth 2 (two) times daily. 60 tablet 0  . insulin detemir (LEVEMIR) 100 UNIT/ML injection Inject 0.15 mLs (15 Units total) into the skin 2 (two) times daily. 10 mL 0  . iron  polysaccharides (NIFEREX) 150 MG capsule Take 1 capsule (150 mg total) by mouth daily. 30 capsule 1  . Multiple Vitamin (MULTIVITAMIN WITH MINERALS) TABS tablet Take 1 tablet by mouth daily.    [name] omeprazole (PRILOSEC) 20 MG capsule Take 20 mg by mouth daily.    . potassium chloride SA (K-DUR,KLOR-CON) 20 MEQ tablet Take 1 tablet (20 mEq total) by mouth 2 (two) times daily. 60 tablet 0  . pregabalin (LYRICA) 75 MG capsule Take 1 capsule (75 mg total) by mouth 2 (two) times daily. 60 capsule 1  . protein supplement shake (PREMIER PROTEIN) LIQD Take 325 mLs (11 oz total) by mouth 2 (two) times daily between meals. 60 Can 0  . rivaroxaban (XARELTO) 20 MG TABS tablet Take 1 tablet (20 mg total) by mouth daily with supper. 30 tablet 0  . saccharomyces boulardii (FLORASTOR) 250 MG capsule Take 1 capsule (250 mg total) by mouth 2 (two) times daily. 14 capsule 0  . sacubitril-valsartan (ENTRESTO) 24-26 MG Take 1 tablet by mouth 2 (two) times daily. 60 tablet 6  . tamsulosin (FLOMAX) 0.4 MG CAPS capsule Take 1 capsule (0.4 mg total) by mouth daily after supper. 30 capsule 0   No current facility-administered medications for this encounter.      REVIEW OF SYSTEMS: On review of systems, the patient reports that he is doing well overall. He denies any chest pain, shortness of breath, cough, fevers, chills, night sweats, unintended weight changes. He denies any bowel or bladder disturbances, and denies abdominal pain, nausea or vomiting. He denies any new musculoskeletal or joint aches or pains. A complete review of systems is obtained and is otherwise negative.     PHYSICAL EXAM:  Wt Readings from Last 3 Encounters:  02/09/17 179 lb 12.8 oz (81.6 kg)  01/08/17 193 lb 12.6 oz (87.9 kg)  12/29/16 196 lb 11.2 oz (89.2 kg)   Temp Readings from Last 3 Encounters:  02/12/17 99.2 F (37.3 C) (Oral)  02/12/17 99.2 F (37.3 C) (Oral)  02/09/17 97.4 F (36.3 C) (Oral)   BP Readings from Last 3 Encounters:    02/12/17 (!) 120/52  02/12/17 [name])  120/52  02/09/17 (!) 109/46   Pulse Readings from Last 3 Encounters:  02/12/17 100  02/12/17 100  02/09/17 90   Pain Assessment Pain Score: 0-No pain/10  In general this is a well appearing male in no acute distress. He is alert and oriented x4 and appropriate throughout the examination. HEENT reveals that the patient is normocephalic, atraumatic. EOMs are intact. PERRLA. Skin is intact without any evidence of gross lesions. Cardiovascular exam reveals a regular rate and rhythm, no clicks rubs or murmurs are auscultated. Chest is clear to auscultation bilaterally. Lymphatic assessment is performed and does not reveal any adenopathy in the cervical, supraclavicular, axillary, or inguinal chains. He has a reducible umbilical hernia. Abdomen has active bowel sounds in all quadrants and is intact. The abdomen is soft, non tender, non distended. His right upper quadrant is dressed with a dressing that is not disturbed.  has a Lower extremities assessed, he is a below the knee bilateral amputee, and the thighs are negative for pretibial pitting edema, deep calf tenderness, cyanosis or clubbing, .    ECOG = 2  0 - Asymptomatic (Fully active, able to carry on all predisease activities without restriction)  1 - Symptomatic but completely ambulatory (Restricted in physically strenuous activity but ambulatory and able to carry out work of a light or sedentary nature. For example, light housework, office work)  2 - Symptomatic, <50% in bed during the day (Ambulatory and capable of all self care but unable to carry out any work activities. Up and about more than 50% of waking hours)  3 - Symptomatic, >50% in bed, but not bedbound (Capable of only limited self-care, confined to bed or chair 50% or more of waking hours)  4 - Bedbound (Completely disabled. Cannot carry on any self-care. Totally confined to bed or chair)  5 - Death   Eustace Pen MM, Creech RH, Tormey DC, et  al. [phone]). "Toxicity and response criteria of the [facility]". [place] Oncol. 5 (6): 649-55    LABORATORY DATA:  Lab Results  Component Value Date   WBC 6.0 01/08/2017   HGB 9.4 (L) 01/08/2017   HCT 29.0 (L) 01/08/2017   MCV 73.8 (L) 01/08/2017   PLT 282 01/08/2017   Lab Results  Component Value Date   NA 135 01/08/2017   K 4.9 01/08/2017   CL 99 (L) 01/08/2017   CO2 28 01/08/2017   Lab Results  Component Value Date   ALT 56 01/07/2017   AST 50 (H) 01/07/2017   ALKPHOS 208 (H) 01/07/2017   BILITOT 1.2 01/07/2017      RADIOGRAPHY: Ir Sinus/fist Tube Chk-non Gi  Result Date: 01/14/2017 CLINICAL DATA:  No further output from cholecystostomy catheter. Continues at home flushing 5 mL normal saline t.i.d. EXAM: CHOLANGIOGRAM THROUGH EXISTING CATHETER ANESTHESIA/SEDATION: None required MEDICATIONS: None required CONTRAST:  10 mL Isovue PROCEDURE: The procedure, risks (including but not limited to bleeding, infection, organ damage ), benefits, and alternatives were explained to the patient. Questions regarding the procedure were encouraged and answered. The patient understands and consents to the procedure. Contrast was injected through the cholecystostomy catheter under fluoroscopy. Catheter then returned to gravity drain bag and a sterile dressing applied. The patient tolerated the procedure well. COMPLICATIONS: None immediate FINDINGS: The cholecystostomy catheter remains formed in the lumen of the gallbladder. The gallbladder is decompressed. Cystic and common bile ducts are patent with contrast flow into the duodenum. Incomplete opacification of the intrahepatic biliary tree. IMPRESSION: 1. Cholecystostomy  catheter in good position, patent. PLAN: Maintain to external drainage. Follow-up with general surgery Dr. Hulen Skains. 1. Electronically Signed   By: Lucrezia Europe M.D.   On: 01/14/2017 14:50       IMPRESSION/PLAN: 1. Probable Stage IIA, cT3N0Mx adenocarcinoma  of the pancreas. Dr. Lisbeth Renshaw discusses the pathology findings and reviews the nature of pancreatic cancer. He recommends completing the work up for this patient to evaluate the areas in question of the liver, as well as of the rectum, and pancreas. He discusses with the patient that we would anticipate a course of SBRT style radiotherapy if he has local disease only. however if he had metastatic disease, we would likely offer a palliative course of radiotherapy. The differences between the two were outlined, and we discussed the risks, benefits, short, and long term effects of radiotherapy, and the patient is interested in proceeding if appropriate. Dr. Lisbeth Renshaw discusses the delivery and logistics of radiotherapy. We will follow up by phone regarding his PET scan. 2. Cholecystitis with cholecystotomy tube. The patient's tube fell out last night, and we've contacted IR about replacing this 3. Possible genetic predisposition to [facility]. The patient's sister died of ovarian cancer and with his history, and recent rectal findings on imaging, we reviewed his last colonoscopy was negative about 6 years ago, but detailed the role of meeting with genetic counseling. A referral is placed.   The above documentation reflects my direct findings during this shared patient visit. Please see the separate note by Dr. Lisbeth Renshaw on this date for the remainder of the patient's plan of care.    Carola Rhine, PAC  This document serves as a record of services personally performed by Shona Simpson, PA-C and Kyung Rudd, MD. It was created on their behalf by Valeta Harms, a trained medical scribe. The creation of this record is based on the scribe's personal observations and the providers' statements to them. This document has been checked and approved by the attending provider.

## 2017-02-13 ENCOUNTER — Encounter: Payer: Self-pay | Admitting: Radiation Oncology

## 2017-02-13 ENCOUNTER — Telehealth: Payer: Self-pay | Admitting: Urology

## 2017-02-13 LAB — CANCER ANTIGEN 19-9: CA 19-9: 186 U/mL — ABNORMAL HIGH (ref 0–35)

## 2017-02-13 NOTE — Telephone Encounter (Signed)
I attempted to return [name] [name]'s call but there was no answer so I left a message on VM advising that she return my call if she still has questions.

## 2017-02-13 NOTE — Telephone Encounter (Signed)
-----   Message from Hayden Pedro, PA-C sent at 02/13/2017  8:21 AM EDT ----- Regarding: FW: phone call   Do you mind calling her? My note is in the chart ----- Message ----- From: Kerri Perches Sent: 02/12/2017   2:49 PM To: Hayden Pedro, PA-C Subject: phone call                                     Hi Bryson Ha,   Would you please call this patient's wife- Enid Derry, she has a question for you..  Ms. Hetland phone number is [phone].  Thanks,  [employer]

## 2017-02-17 ENCOUNTER — Telehealth: Payer: Self-pay | Admitting: *Deleted

## 2017-02-17 NOTE — Telephone Encounter (Signed)
CALLED PATIENT TO INFORM OF PET SCAN FOR 02-26-17- ARRIVAL TIME - 12:30 PM, PT. TO BE NPO- 8 HRS.[name] PRIOR TO TEST, TEST TO BE @ [facility], SPOKE WITH PATIENT'S WIFE- [name] [name] AND SHE IS AWARE OF THIS TEST

## 2017-02-20 ENCOUNTER — Encounter: Payer: Self-pay | Admitting: *Deleted

## 2017-02-20 NOTE — Progress Notes (Signed)
[place] Psychosocial Distress Screening Clinical Social Work  Clinical Social Work was referred by distress screening protocol.  The patient scored a 7 on the Psychosocial Distress Thermometer which indicates severe distress. Clinical Social Worker reviewed chart and phoned pt to assess for distress and other psychosocial needs. CSW phoned pt and left brief message; introduced self, explained role of CSW/Pt and Family Support Team, support groups and other resources to assist. From chart review, it appears inpt CSW assisted with SCAT, this CSW can assist as well and encouraged pt to return call.    ONCBCN DISTRESS SCREENING 02/12/2017  Screening Type Initial Screening  Distress experienced in past week (1-10) 7  Practical problem type Housing;Transportation  Emotional problem type Nervousness/Anxiety;Adjusting to illness  Physical Problem type Getting around;Constipation/diarrhea;Changes in urination;Tingling hands/feet;Skin dry/itchy  Physician notified of physical symptoms Yes  Referral to clinical social work Yes    Clinical Social Worker follow up needed: Yes.    If yes, follow up plan: See above Loren Racer, LCSW, OSW-C Clinical Social Worker [place]  [facility] Phone: [phone] Fax: [phone]

## 2017-02-26 ENCOUNTER — Telehealth: Payer: Self-pay | Admitting: *Deleted

## 2017-02-26 ENCOUNTER — Encounter: Payer: Self-pay | Admitting: Genetics

## 2017-02-26 ENCOUNTER — Telehealth: Payer: Self-pay | Admitting: Genetics

## 2017-02-26 ENCOUNTER — Encounter (HOSPITAL_COMMUNITY)
Admission: RE | Admit: 2017-02-26 | Discharge: 2017-02-26 | Disposition: A | Discharge status: Home / Self Care | Payer: Medicare Other | Source: Ambulatory Visit | Attending: Radiation Oncology | Admitting: Radiation Oncology

## 2017-02-26 ENCOUNTER — Ambulatory Visit (HOSPITAL_COMMUNITY)
Admission: RE | Admit: 2017-02-26 | Discharge: 2017-02-26 | Disposition: A | Discharge status: Home / Self Care | Payer: Medicare Other | Source: Ambulatory Visit | Attending: Nurse Practitioner | Admitting: Nurse Practitioner

## 2017-02-26 DIAGNOSIS — C259 Malignant neoplasm of pancreas, unspecified: Secondary | ICD-10-CM | POA: Diagnosis present

## 2017-02-26 LAB — GLUCOSE, CAPILLARY: GLUCOSE-CAPILLARY: 322 mg/dL — AB (ref 65–99)

## 2017-02-26 NOTE — Telephone Encounter (Signed)
Called patient home, spoke with spouse, per Staci Righter, to have patient iget in contac with his Primary Md about his elevated blood sugar as, todays=322, per wife, "yes he ate something he shouldn't have yesterday",he knows better", asked that she or patient call MD tol et them know of blood glucose elevated, she stated she would, 4:02 PM

## 2017-02-26 NOTE — Telephone Encounter (Signed)
Received a vm from [place] in radonc to schedule a genetic counseling appt for the pt. Appt has been scheduled for the pt to see Ria Comment on 7/16 at 10am. Will mail the pt a letter. Msg fwd to [place] to notify the pt.

## 2017-02-26 NOTE — Telephone Encounter (Signed)
CALLED PATIENT TO INFORM OF GENETICS APPT. ON 03-09-17 @ 10 AM WITH [name], LVM FOR A RETURN CALL

## 2017-02-27 ENCOUNTER — Ambulatory Visit (HOSPITAL_COMMUNITY): Payer: Medicare Other

## 2017-03-03 ENCOUNTER — Telehealth: Payer: Self-pay | Admitting: Radiation Oncology

## 2017-03-03 DIAGNOSIS — C259 Malignant neoplasm of pancreas, unspecified: Secondary | ICD-10-CM

## 2017-03-03 NOTE — Telephone Encounter (Signed)
I LM for pt to let him know that we need to move forward with planning treatment based on if there is liver disease or not. We will forgo PET scan and change orders for MRI of the abdomen. Stat orders placed and we will follow up with these results as available and determine either SBRT versus palliative XRT.

## 2017-03-09 ENCOUNTER — Other Ambulatory Visit: Payer: Medicare Other

## 2017-03-09 ENCOUNTER — Ambulatory Visit (HOSPITAL_BASED_OUTPATIENT_CLINIC_OR_DEPARTMENT_OTHER): Payer: Medicare Other | Admitting: Genetics

## 2017-03-09 DIAGNOSIS — Z8041 Family history of malignant neoplasm of ovary: Secondary | ICD-10-CM | POA: Diagnosis not present

## 2017-03-09 DIAGNOSIS — C250 Malignant neoplasm of head of pancreas: Secondary | ICD-10-CM | POA: Diagnosis not present

## 2017-03-09 DIAGNOSIS — Z315 Encounter for genetic counseling: Secondary | ICD-10-CM

## 2017-03-09 DIAGNOSIS — C259 Malignant neoplasm of pancreas, unspecified: Secondary | ICD-10-CM

## 2017-03-10 ENCOUNTER — Encounter: Payer: Self-pay | Admitting: Genetics

## 2017-03-10 DIAGNOSIS — Z8041 Family history of malignant neoplasm of ovary: Secondary | ICD-10-CM | POA: Insufficient documentation

## 2017-03-10 NOTE — Progress Notes (Signed)
REFERRING PROVIDER: Ladell Pier, MD [address] [place], [place] [address]  PRIMARY PROVIDER:  Josetta Huddle, MD  PRIMARY REASON FOR VISIT:  1. Pancreatic adenocarcinoma ([place])   2. Family history of ovarian cancer     HISTORY OF PRESENT ILLNESS:   [name] [name], a 71 y.o. male, was seen for a [facility] cancer genetics consultation at the request of Dr. Benay Spice due to a personal and family history of cancer.  [name] [name] presents to clinic today to discuss the possibility of a hereditary predisposition to cancer, genetic testing, and to further clarify his future cancer risks, as well as potential cancer risks for family members.   In 2018, at the age of 29, [name] [name] was diagnosed with pancreatic adenocarcinoma of the pancreatic head.    Colonoscopy: yes; normal.  Past Medical History:  Diagnosis Date  . Anemia    takes Iron pill daily  . Arthritis   . Automatic implantable cardioverter-defibrillator in situ    a. 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  . CAD (coronary artery disease)   . Cardiomyopathy, nonischemic ([place])    a. Cath 2003: mild nonobstructive CAD, EF 25% at that time.  . Chronic systolic CHF (congestive heart failure) (HCC)    takes Furosemide daily as well as Aldactone  . CKD (chronic kidney disease), stage III   . Complication of anesthesia   . Constipation    takes Sennokot daily  . Dementia   . Family history of ovarian cancer   . Gangrenous toe, Lt toe 09/21/2013  . GERD (gastroesophageal reflux disease)    takes Nexium daily  . Headache    occasionally  . High cholesterol    takes Atorvastatin daily  . History of blood transfusion    no abnormal reaction noted  . History of bronchitis    > 1 yr ago  . History of kidney stones   . Hypertension    takes Coreg daily  . LBBB (left bundle branch block)    takes Coumadin daily  . NSVT (nonsustained ventricular tachycardia) ([place])    a. Noted on ICD  interrogation in 2011.  [name] PAD (peripheral artery disease) ([place])    a. 08/2013 Periph Angio/PTA: Abd Ao nl, RLE- 3v runoff, PT diff dzs, AT 90p, LLE 2v runoff, PT 100, AT 69m(diamondback ORA/chocolate balloon PTA).  .Marland KitchenPAF (paroxysmal atrial fibrillation) ([facility]    a. Noted on ICD interrogation 2012;  b. coumadin d/c'd 01/2013.  .Marland KitchenPAF (paroxysmal atrial fibrillation) ([facility]   . Pancreatic cancer ([facility]   . Peripheral neuropathy    hands;numbness and tingling   . Pneumonia    hx of > 89yrgo  . PONV (postoperative nausea and vomiting)   . Presence of permanent cardiac pacemaker   . Type II diabetes mellitus ([facility]   takes Levemir daily as well as Novolog  . Urinary frequency   . Urinary urgency     Past Surgical History:  Procedure Laterality Date  . ABDOMINAL ANGIOGRAM  09/12/2013   Procedure: ABDOMINAL ANGIOGRAM;  Surgeon: JoLorretta HarpMD;  [facility];  Service: Cardiovascular;;  . AMPUTATION Left 10/04/2013   Procedure: LEFT GREAT TOE AND SECOND TOE AMPUTATION;  Surgeon: NaMarianna PaymentMD;  [facility] Service: Orthopedics;  Laterality: Left;  . AMPUTATION Left 11/09/2013   Procedure: LEFT AMPUTATION BELOW KNEE;  Surgeon: NaMarianna PaymentMD;  [facility] Service: [facility]ics;  Laterality: Left;  .  AMPUTATION Right 01/04/2014   Procedure: Doristine Devoid second and third toe amputation;  Surgeon: Marianna Payment, MD;  Location: [facility];  Service: Orthopedics;  Laterality: Right;  . AMPUTATION Right 01/30/2014   Procedure: RIGHT BELOW KNEE AMPUTATION;  Surgeon: Marianna Payment, MD;  Location: [facility];  Service: Orthopedics;  Laterality: Right;  . ATHERECTOMY Right 12/29/2013   Procedure: ATHERECTOMY;  Surgeon: Lorretta Harp, MD;  Location: [facility] C[facility] LAB;  Service: Cardiovascular;  Laterality: Right;  Anterior Tibial Artery  . CARDIAC CATHETERIZATION  10/01/2001   THERE WAS GLOBAL HYPOKINESIS AND EF 25%. THERE APPEARED TO BE GLOBAL DECREASE IN WALL MOTION  . CARDIAC  DEFIBRILLATOR PLACEMENT  06/2009   WITH GENERATOR REPLACED; BiV ICD  . CARDIOVASCULAR STRESS TEST  03/20/2009   EF 33%  . [place]  . COLONOSCOPY    . ESOPHAGOGASTRODUODENOSCOPY    . EUS Left 12/15/2016   Procedure: UPPER ENDOSCOPIC ULTRASOUND (EUS) LINEAR;  Surgeon: Arta Silence, MD;  Location: [facility];  Service: Endoscopy;  Laterality: Left;  . HIP ARTHROPLASTY Left 12/28/2015   Procedure: POSTERIOR  APPROACH HEMI HIP ARTHROPLASTY;  Surgeon: Leandrew Koyanagi, MD;  Location: [facility];  Service: Orthopedics;  Laterality: Left;  . IR CATHETER TUBE CHANGE  12/08/2016  . IR GENERIC HISTORICAL  10/18/2016   IR PERC CHOLECYSTOSTOMY 10/18/2016 [name], MD [facility]  . IR RADIOLOGIST EVAL & MGMT  11/20/2016  . IR RADIOLOGIST EVAL & MGMT  12/31/2016  . IR SINUS/FIST TUBE CHK-NON GI  01/14/2017  . LEG AMPUTATION BELOW KNEE Left 11/09/2013   DR [name] [name]  . LITHOTRIPSY  2001  . LOWER EXTREMITY ANGIOGRAM Bilateral 09/12/2013   Procedure: LOWER EXTREMITY ANGIOGRAM;  Surgeon: Lorretta Harp, MD;  Location: [facility] CATH LAB;  Service: Cardiovascular;  Laterality: Bilateral;  . LOWER EXTREMITY ANGIOGRAM N/A 12/29/2013   Procedure: LOWER EXTREMITY ANGIOGRAM;  Surgeon: Lorretta Harp, MD;  Location: [facility] CATH LAB;  Service: Cardiovascular;  Laterality: N/A;  . STUMP REVISION Left 01/03/2015   Procedure: LEFT BELOW KNEE AMPUTATION REVISION;  Surgeon: Leandrew Koyanagi, MD;  Location: [facility];  Service: Orthopedics;  Laterality: Left;  . TOE AMPUTATION  10/04/2013   LEFT GREAT TOE AND 4TH TOE   /   DR [name] [name]  . TRANSLUMINAL ATHERECTOMY TIBIAL ARTERY Left 09/12/2013  . US ECHOCARDIOGRAPHY  03/21/2008   EF 30-35%    Social History   Social History  . Marital status: Married    Spouse name: N/A  . Number of children: N/A  . Years of education: N/A   Social History Main Topics  . Smoking status: Never Smoker  . Smokeless tobacco: Never Used  . Alcohol use No  . Drug use: No  . Sexual activity: Not  Currently   Other Topics Concern  . None   Social History Narrative  . None     FAMILY HISTORY:  We obtained a detailed, 4-generation family history.  Significant diagnoses are listed below: Family History  Problem Relation Age of Onset  . Heart disease Mother   . Hypertension Mother   . Diabetes Mother   . Diabetes Father   . Ovarian cancer Sister 52       died in her 25's   Mr. Crill has a 72 year-old son who has no children or any history of cancer.  Mr. Allman has 1 sister and 3 brothers listed below: -Mr. Aultman has a sister who was diagnosed with ovarian cancer in  her 38's and died in her 59's.  She had 2 daughters and 3 sons who are all in their 16's with no history of cancer.  These nieces and nephews have 6 children total with no history of cancer.  -Mr. Bhagat has a brother who is 80 with no history of cancer.  He has 3 daughters in their 33's with no history of cancer.  -Mr. Clinch has a brother who is 8 and has no history of cancer.  He has a son and a daughter in their 47's with no history of cancer.  This niece and nephew have 3 children with no history of cancer.  -Mr. Chartrand has a brother who is 81 and has no children or any history of cancer.  Mr. Dunavant father died at 52 and had no history of cancer.  Mr. Mitton has 4 paternal aunts. All of them died at older ages (33's, 70's, 63's) and none of them had any known history of cancer. Mr. Reas has 6 paternal cousins with no known history of cancer.  Mr. Carmean does not know much information about the death of his paternal grandparents, but does not know of any cancer diagnosis.   Mr. Mahany mother died at 34 and had no history of cancer.  Mr. Hedgepath has a maternal aunt who died in her 31's and had no history of cancer.  This aunt had 2 children with no known cancer diagnosis.  Mr. Tobon has a maternal uncle who died in his 97's and had no history of cancer.  This uncle had 2 children with no known history of  cancer. Mr. Borin does not know any information about his maternal grandparents and their health.   Mr. Amadon is unaware of previous family history of genetic testing for hereditary cancer risks. Patient's maternal ancestors are of African American descent, and paternal ancestors are of African American descent. There is No reported Ashkenazi Jewish ancestry. There is No known consanguinity.  GENETIC COUNSELING ASSESSMENT: Jasim Harari Skelly is a 71 y.o. male with a personal and family history which is somewhat suggestive of a Hereditary Cancer Predisposition Syndrome and predisposition to cancer. We, therefore, discussed and recommended the following at today's visit.   DISCUSSION: We reviewed the characteristics, features and inheritance patterns of hereditary cancer syndromes. We also discussed genetic testing, including the appropriate family members to test, the process of testing, insurance coverage and turn-around-time for results. We discussed the implications of a negative, positive and/or variant of uncertain significant result. We recommended Mr. Detty pursue genetic testing for the [facility] Common Hereditary Cancer gene panel with the Pancreatic Cancer panel added.  The Hereditary Gene Panel offered by [facility] includes sequencing and/or deletion duplication testing of the following 46 genes: APC, ATM, AXIN2, BARD1, BMPR1A, BRCA1, BRCA2, BRIP1, CDH1, CDKN2A (p14ARF), CDKN2A (p16INK4a), CHEK2, CTNNA1, DICER1, EPCAM (Deletion/duplication testing only), GREM1 (promoter region deletion/duplication testing only), KIT, MEN1, MLH1, MSH2, MSH3, MSH6, MUTYH, NBN, NF1, NHTL1, PALB2, PDGFRA, PMS2, POLD1, POLE, PTEN, RAD50, RAD51C, RAD51D, SDHB, SDHC, SDHD, SMAD4, SMARCA4. STK11, TP53, TSC1, TSC2, and VHL.  The following genes were evaluated for sequence changes only: SDHA and HOXB13 c.251G>A variant only.   The [facility] Pancreatic Cancer Panel includes analysis of the following genes:APC, ATM, BMPR1A,  BRCA1, BRCA2, CDKN2A, EPCAM, MEN1, MLH1, MSH2, MSH6, NF1, PALB2, PMS2, SMAD4, STK11, TP53, TSC1, TSC2, VHL,CDK4, FANCC, PALLD,CASR, CFTR, CTRC, PRSS1, and SPINK1.  We discussed that only 5-10% of cancers are associated with a Hereditary cancer predisposition syndrome.  One  of the most common hereditary cancer syndromes that increase pancreatic and ovarian cancer risk is called Hereditary Breast and Ovarian Cancer (HBOC) syndrome.  This syndrome is caused by mutations in the BRCA1 and BRCA2 genes.  This syndrome increases an individual's lifetime risk to develop breast, ovarian, pancreatic, and other types of cancer.  There are also many other cancer predisposition syndromes caused by mutations in several other genes.  We discussed that if she is found to have a mutation in one of these genes, it may impact medical management recommendations such as increased cancer screenings or treatment.  A positive result could also have implications for the patient's family members.  A Negative result would mean we were unable to identify a hereditary component to her cancer, but does not rule out the possibility of a hereditary basis for her cancer.  There could be mutations that are undetectable by current technology, or in genes not yet tested or identified to increase cancer risk.    We discussed the potential to find a Variant of Uncertain Significance or VUS.  These are variants that have not yet been identified as pathogenic or benign, and it is unknown if this variant is associated with increased cancer risk or if this is a normal finding.  Most VUS's are reclassified to benign or likely benign.   It should not be used to make medical management decisions. With time, we suspect the lab will determine the significance of any VUS's identified if any.   Based on Mr. Schuyler personal and family history of cancer, he meets medical criteria for genetic testing. Despite that he meets criteria, he may still have an  out of pocket cost. We discussed that if his out of pocket cost for testing is over $100, the laboratory will call and confirm whether he wants to proceed with testing.  If the out of pocket cost of testing is less than $100 he will be billed by the genetic testing laboratory.   PLAN: After considering the risks, benefits, and limitations, Mr. Pautz  provided informed consent to pursue genetic testing and the blood sample was sent to [facility] for analysis of the [facility] Common Hereditary Cancer Panel + [facility] Pancreatic Cancer Panel. Results should be available within approximately 2-3 weeks' time, at which point they will be disclosed by telephone to Mr. Geraghty, as will any additional recommendations warranted by these results. Mr. Stachnik will receive a summary of his genetic counseling visit and a copy of his results once available. This information will also be available in Epic. We encouraged Mr. Seydel to remain in contact with cancer genetics annually so that we can continuously update the family history and inform him of any changes in cancer genetics and testing that may be of benefit for his family. Mr. Tibbs questions were answered to his satisfaction today. Our contact information was provided should additional questions or concerns arise.  Lastly, we encouraged Mr. Maese to remain in contact with cancer genetics annually so that we can continuously update the family history and inform him of any changes in cancer genetics and testing that may be of benefit for this family.   Mr.  Denardo questions were answered to his satisfaction today. Our contact information was provided should additional questions or concerns arise. Thank you for the referral and allowing [place] to share in the care of your patient.   Tana Felts, MS Genetic Counselor [email or web address] phone: [phone]  The patient was seen for a total of 40  minutes in face-to-face genetic counseling. He  was accompanied by his wife and grandson today.

## 2017-03-16 ENCOUNTER — Ambulatory Visit (INDEPENDENT_AMBULATORY_CARE_PROVIDER_SITE_OTHER): Payer: Medicare Other | Admitting: *Deleted

## 2017-03-16 DIAGNOSIS — I420 Dilated cardiomyopathy: Secondary | ICD-10-CM | POA: Diagnosis not present

## 2017-03-17 ENCOUNTER — Ambulatory Visit (HOSPITAL_BASED_OUTPATIENT_CLINIC_OR_DEPARTMENT_OTHER): Payer: Medicare Other | Admitting: Oncology

## 2017-03-17 VITALS — BP 126/51 | HR 75 | Temp 98.3°F | Resp 18 | Ht 69.0 in | Wt 183.8 lb

## 2017-03-17 DIAGNOSIS — C259 Malignant neoplasm of pancreas, unspecified: Secondary | ICD-10-CM

## 2017-03-17 DIAGNOSIS — C251 Malignant neoplasm of body of pancreas: Secondary | ICD-10-CM

## 2017-03-17 DIAGNOSIS — N189 Chronic kidney disease, unspecified: Secondary | ICD-10-CM | POA: Diagnosis not present

## 2017-03-17 DIAGNOSIS — E119 Type 2 diabetes mellitus without complications: Secondary | ICD-10-CM

## 2017-03-17 DIAGNOSIS — K6289 Other specified diseases of anus and rectum: Secondary | ICD-10-CM | POA: Diagnosis not present

## 2017-03-17 DIAGNOSIS — I4891 Unspecified atrial fibrillation: Secondary | ICD-10-CM

## 2017-03-17 DIAGNOSIS — D509 Iron deficiency anemia, unspecified: Secondary | ICD-10-CM | POA: Diagnosis not present

## 2017-03-17 NOTE — Progress Notes (Signed)
Remote ICD transmission.   

## 2017-03-17 NOTE — Progress Notes (Signed)
  [place] OFFICE PROGRESS NOTE   Diagnosis: Pancreas cancer  INTERVAL HISTORY:   Mr. Nickson returns as scheduled. He feels well. No abdominal pain. Good appetite. An abdominal ultrasound 02/26/2017 confirmed a mass in the pancreas. No liver lesions were seen. The gallbladder drain fell out and was not replaced.  Objective:  Vital signs in last 24 hours:  Blood pressure (!) 126/51, pulse 75, temperature 98.3 F (36.8 C), temperature source Oral, resp. rate 18, height 5\' 9"  (1.753 m), weight 183 lb 12.8 oz (83.4 kg), SpO2 100 %.    HEENT: Neck without mass Lymphatics: No cervical or supraclavicular nodes Resp: Lungs clear bilaterally Cardio: Regular rate and rhythm GI: No hepatomegaly, no mass, nontender   Medications: I have reviewed the patient's current medications.  Assessment/Plan: 1. Pancreas cancer, pancreas body mass on CT 12/08/2016  CT 12/08/2016 consistent with a pancreas body mass and liver lesions suspicious for metastases  EUS on 12/15/2016 confirmed a pancreas body mass, T3 N0, biopsy consistent with adenocarcinoma  Abdominal ultrasound 02/26/2017-pancreas mass, no liver lesions apparent  2. Acute cholecystitis February 2018, status post placement of a cholecystostomy tube  3. Diabetes  4. Congestive heart failure; hospitalized May 2018 with CHF  5. Peripheral vascular disease, status post bilateral BKA  6. Atrial fibrillation  7. Microcytic anemia  8. C. difficile colitis April 2018  9. Chronic renal failure  10. Anorectal hyperenhancing lesion noted on CT 12/08/2016   Disposition:  He appears asymptomatic from the pancreas cancer. He is scheduled for a PET scan 03/19/2017. If the PET scan reveals no evidence of metastatic disease he will see Dr. Lisbeth Renshaw to consider stereotactic radiation. Mr. Bilal will return for an office visit in 2 months. We will see him sooner if the PET scan confirms metastatic  disease.  15 minutes were spent with the patient today. The majority of the time was used for counseling and coordination of care.  Donneta Romberg, MD  03/17/2017  10:41 AM

## 2017-03-19 ENCOUNTER — Telehealth: Payer: Self-pay | Admitting: Oncology

## 2017-03-19 ENCOUNTER — Encounter: Payer: Self-pay | Admitting: Cardiology

## 2017-03-19 ENCOUNTER — Encounter (HOSPITAL_COMMUNITY)
Admission: RE | Admit: 2017-03-19 | Discharge: 2017-03-19 | Disposition: A | Discharge status: Home / Self Care | Payer: Medicare Other | Source: Ambulatory Visit | Attending: Radiation Oncology | Admitting: Radiation Oncology

## 2017-03-19 DIAGNOSIS — C259 Malignant neoplasm of pancreas, unspecified: Secondary | ICD-10-CM | POA: Diagnosis not present

## 2017-03-19 LAB — GLUCOSE, CAPILLARY: GLUCOSE-CAPILLARY: 133 mg/dL — AB (ref 65–99)

## 2017-03-19 MED ORDER — FLUDEOXYGLUCOSE F - 18 (FDG) INJECTION
9.1000 | Freq: Once | INTRAVENOUS | Status: AC
  Administered 2017-03-19: 9.1 via INTRAVENOUS

## 2017-03-19 NOTE — Telephone Encounter (Signed)
lvm about appointment on 9/18 at 3:30

## 2017-03-20 ENCOUNTER — Telehealth: Payer: Self-pay | Admitting: *Deleted

## 2017-03-20 NOTE — Telephone Encounter (Signed)
Called patient home, spoke with Enid Derry, wife, informed her that we have received his Pet scan and next Wednesday Dr. Benay Spice will review it at their conference,with Dr. Lisbeth Renshaw and Shona Simpson, PA-XC, then she will call you after the conference, Enid Derry thanked this RN and look forward to hearing from Alison\8:53 AM 8:53 AM

## 2017-03-26 ENCOUNTER — Telehealth: Payer: Self-pay | Admitting: *Deleted

## 2017-03-26 NOTE — Telephone Encounter (Signed)
Cased discussed in conference. Dr. Benay Spice called wife with recommendation. Office visit appt for 8/3 @ 1230 given to wife. She understands RadOnc appt will follow.

## 2017-03-27 ENCOUNTER — Ambulatory Visit (HOSPITAL_BASED_OUTPATIENT_CLINIC_OR_DEPARTMENT_OTHER): Payer: Medicare Other | Admitting: Oncology

## 2017-03-27 VITALS — BP 103/62 | HR 77 | Temp 98.3°F | Resp 18 | Ht 69.0 in | Wt 177.5 lb

## 2017-03-27 DIAGNOSIS — I4891 Unspecified atrial fibrillation: Secondary | ICD-10-CM

## 2017-03-27 DIAGNOSIS — K6289 Other specified diseases of anus and rectum: Secondary | ICD-10-CM

## 2017-03-27 DIAGNOSIS — D509 Iron deficiency anemia, unspecified: Secondary | ICD-10-CM | POA: Diagnosis not present

## 2017-03-27 DIAGNOSIS — C251 Malignant neoplasm of body of pancreas: Secondary | ICD-10-CM | POA: Diagnosis not present

## 2017-03-27 DIAGNOSIS — N189 Chronic kidney disease, unspecified: Secondary | ICD-10-CM

## 2017-03-27 DIAGNOSIS — E119 Type 2 diabetes mellitus without complications: Secondary | ICD-10-CM | POA: Diagnosis not present

## 2017-03-27 DIAGNOSIS — C259 Malignant neoplasm of pancreas, unspecified: Secondary | ICD-10-CM

## 2017-03-27 NOTE — Progress Notes (Signed)
[name] [name] OFFICE PROGRESS NOTE   Diagnosis: Pancreas cancer  INTERVAL HISTORY:   [name] [name] returns to discuss treatment options after the staging PET scan. The PET scan on 03/19/2017 revealed small chest lymph nodes with mild associated hypermetabolism. The pancreas mass has enlarged and now involves the body and tail. The mass is hypermetabolic. The mass abuts the superior mesenteric artery and celiac trunk. The mass also abuts the portal vein, liver margin, stomach margin, and duodenum. An indistinct portal node had an SUV of 4.2. No hepatic metastases. Irregular lucency and possible compression at the superior endplate of T9.  [name] [name] is scheduled to see Dr. Lisbeth Renshaw later today.  [name] [name] feels well. No complaint. No abdominal pain or nausea. Good appetite.  Objective:  Vital signs in last 24 hours:  Blood pressure 103/62, pulse 77, temperature 98.3 F (36.8 C), temperature source Oral, resp. rate 18, height 5\' 9"  (1.753 m), weight 177 lb 8 oz (80.5 kg), SpO2 100 %.     Resp: Lungs clear bilaterally Cardio: Regular rhythm with premature beats GI: No hepatomegaly, no mass, nontender Vascular: No leg edema   Lab Results:  Lab Results  Component Value Date   WBC 6.0 01/08/2017   HGB 9.4 (L) 01/08/2017   HCT 29.0 (L) 01/08/2017   MCV 73.8 (L) 01/08/2017   PLT 282 01/08/2017   NEUTROABS 4.2 01/07/2017    CMP     Component Value Date/Time   NA 135 01/08/2017 0815   NA 144 10/07/2016 1031   K 4.9 01/08/2017 0815   CL 99 (L) 01/08/2017 0815   CO2 28 01/08/2017 0815   GLUCOSE 253 (H) 01/08/2017 0815   BUN 22 (H) 01/08/2017 0815   BUN 32 (H) 10/07/2016 1031   CREATININE 1.25 (H) 01/08/2017 0815   CREATININE 1.36 (H) 11/14/2015 1024   CALCIUM 8.2 (L) 01/08/2017 0815   PROT 6.7 01/07/2017 0531   ALBUMIN 2.5 (L) 01/07/2017 0531   AST 50 (H) 01/07/2017 0531   ALT 56 01/07/2017 0531   ALKPHOS 208 (H) 01/07/2017 0531   BILITOT 1.2 01/07/2017 0531   GFRNONAA 55  (L) 01/08/2017 0815   GFRAA >60 01/08/2017 0815     Medications: I have reviewed the patient's current medications.  Assessment/Plan: 1. Pancreas cancer, pancreas body mass on CT 12/08/2016  CT 12/08/2016 consistent with a pancreas body mass and liver lesions suspicious for metastases  EUS on 12/15/2016 confirmed a pancreas body mass, T3 N0, biopsy consistent with adenocarcinoma  Abdominal ultrasound 02/26/2017-pancreas mass, no liver lesions apparent  PET scan 03/19/2017-enlargement of pancreas mass with vascular encasement and new involvement of the tail of the pancreas, mildly hypermetabolic chest and porta hepatis nodes. Possible hypermetabolic activity at [id number] to represent a benign etiology  2. Acute cholecystitis February 2018, status post placement of a cholecystostomy tube  3. Diabetes  4. Congestive heart failure;hospitalized May 2018 with CHF  5. Peripheral vascular disease, status post bilateral BKA  6. Atrial fibrillation  7. Microcytic anemia  8. C. difficile colitis April 2018  9. Chronic renal failure  10. Anorectal hyperenhancing lesion noted on CT 12/08/2016   Disposition:  [name] [name] appears unchanged. I discussed the PET findings with [name] [name] and his family. I reviewed the PET images. The pancreas mass has grown significantly and now appears to also involve the tail of the pancreas. [name] [name] may have metastatic disease involving chest and abdominal lymph nodes.  His case was presented at the GI tumor conference on 03/25/2017. [name] [name]  is not a surgical candidate. The recommendation is to consider palliative radiation/Xeloda or systemic therapy. I discussed options with [name] [name]. [name] [name] agrees to proceed with a course of palliative radiation and Xeloda. [name] [name] understands no therapy will be curative. The goal of radiation is to prevent development of pain and bowel obstruction/invasion by tumor.  We can consider gemcitabine/Abraxane chemotherapy  in the future.  I reviewed the potential toxicities associated with Xeloda including the chance for nausea, mucositis, diarrhea, and hematologic toxicity. We discussed the sun sensitivity, rash, hyperpigmentation, and hand/foot syndrome associated with Xeloda. [name] [name] will attend a chemotherapy teaching class. [name] [name] agrees to proceed.  We will obtain baseline laboratory studies prior to beginning Xeloda.  30 minutes were spent with the patient today. The majority of the time was used for counseling and coordination of care.  Donneta Romberg, MD  03/27/2017  3:16 PM

## 2017-03-30 ENCOUNTER — Telehealth: Payer: Self-pay | Admitting: Genetics

## 2017-03-30 ENCOUNTER — Encounter: Payer: Self-pay | Admitting: Radiation Oncology

## 2017-03-30 ENCOUNTER — Ambulatory Visit: Payer: Medicare Other | Admitting: Oncology

## 2017-03-30 ENCOUNTER — Encounter: Payer: Self-pay | Admitting: Genetics

## 2017-03-30 ENCOUNTER — Ambulatory Visit: Payer: Self-pay | Admitting: Genetics

## 2017-03-30 DIAGNOSIS — C259 Malignant neoplasm of pancreas, unspecified: Secondary | ICD-10-CM

## 2017-03-30 DIAGNOSIS — Z1379 Encounter for other screening for genetic and chromosomal anomalies: Secondary | ICD-10-CM

## 2017-03-30 DIAGNOSIS — Z8041 Family history of malignant neoplasm of ovary: Secondary | ICD-10-CM

## 2017-03-30 NOTE — Progress Notes (Signed)
HPI: Mr. Amirault was previously seen in the [facility] clinic on 03/09/2017 due to a personal history of pancreatic cancer, family history of ovarian cancer, and concerns regarding a hereditary predisposition to cancer. Please refer to our prior cancer genetics clinic note for more information regarding Mr. Bedrosian medical, social and family histories, and our assessment and recommendations, at the time. Mr. Lares recent genetic test results were disclosed to him, as well as recommendations warranted by these results. These results and recommendations are discussed in more detail below.  FAMILY HISTORY:  We obtained a detailed, 4-generation family history.  Significant diagnoses are listed below: Family History  Problem Relation Age of Onset  . Heart disease Mother   . Hypertension Mother   . Diabetes Mother   . Diabetes Father   . Ovarian cancer Sister 73       died in her 28's   Mr. Porcaro has a 37 year-old son who has no children or any history of cancer.  Mr. Yandow has 1 sister and 3 brothers listed below: -Mr. Bozman has a sister who was diagnosed with ovarian cancer in her 3's and died in her 14's.  She had 2 daughters and 3 sons who are all in their 67's with no history of cancer.  These nieces and nephews have 6 children total with no history of cancer.  -Mr. Meleski has a brother who is 46 with no history of cancer.  He has 3 daughters in their 1's with no history of cancer.  -Mr. Vanoverbeke has a brother who is 40 and has no history of cancer.  He has a son and a daughter in their 51's with no history of cancer.  This niece and nephew have 3 children with no history of cancer.  -Mr. Mort has a brother who is 21 and has no children or any history of cancer.  Mr. Wilsey father died at 67 and had no history of cancer.  Mr. Huot has 4 paternal aunts. All of them died at older ages (31's, 65's, 84's) and none of them had any known history of cancer. Mr. Oshita has 6  paternal cousins with no known history of cancer.  Mr. Kilmer does not know much information about the death of his paternal grandparents, but does not know of any cancer diagnosis.   Mr. Barbar mother died at 92 and had no history of cancer.  Mr. Riling has a maternal aunt who died in her 68's and had no history of cancer.  This aunt had 2 children with no known cancer diagnosis.  Mr. Charters has a maternal uncle who died in his 55's and had no history of cancer.  This uncle had 2 children with no known history of cancer. Mr. Menning does not know any information about his maternal grandparents and their health.   Mr. Sperbeck is unaware of previous family history of genetic testing for hereditary cancer risks. Patient's maternal ancestors are of African American descent, and paternal ancestors are of African American descent. There is No reported Ashkenazi Jewish ancestry. There is No known consanguinity.  GENETIC TEST RESULTS: Genetic testing performed through [facility] Common Hereditary Cancer Panel + Pancreatic Cancer/Pancreatitis Panel reported out on 03/27/2017 showed no pathogenic mutations. The following 54 genes were analyzed (includes sequencing and/or deletion duplication testing): APC, ATM, AXIN2, BARD1, BMPR1A, BRCA1, BRCA2, BRIP1, CDH1, CASR, CFTR, CTRC, CDKN2A (p14ARF), CDK4, CDKN2A (p16INK4a), CHEK2, CTNNA1, DICER1, EPCAM (Deletion/duplication testing only), FANCC, GREM1 (promoter region deletion/duplication testing only),  KIT, MEN1, MLH1, MSH2, MSH3, MSH6, MUTYH, NBN, NF1, NHTL1, PALB2, PDGFRA, PMS2, PRSS1, POLD1, POLE, PTEN, PALLD, RAD50, RAD51C, RAD51D, SDHB, SDHC, SDHD, SMAD4, SMARCA4. STK11, SPINK1, TP53, TSC1, TSC2, and VHL.  The following genes were evaluated for sequence changes only: SDHA and HOXB13 c.251G>A variant only..  A variant of uncertain significance (VUS) in a gene called BRCA2 c.6293C>T (p.Ser2098Phe) was also noted.   The test report will be scanned into EPIC and will  be located under the Molecular Pathology section of the Results Review tab.A portion of the result report is included below for reference.     We discussed with Mr. Bednarczyk that because current genetic testing is not perfect, it is possible there may be a gene mutation in one of these genes that current testing cannot detect, but that chance is small. We also discussed, that there could be another gene that has not yet been discovered, or that we have not yet tested, that is responsible for the cancer diagnoses in the family. Therefore, it is important to remain in touch with cancer genetics in the future so that we can continue to offer [name] the most up to date genetic testing.   Regarding the VUS in BRCA2: At this time, it is unknown if this variant is associated with increased cancer risk or if this is a normal finding, but most variants such as this get reclassified to being inconsequential. It should not be used to make medical management decisions. With time, we suspect the lab will determine the significance of this variant, if any. If we do learn more about it, we will try to contact Mr. Brubacher to discuss it further. However, it is important to stay in touch with [place] periodically and keep the address and phone number up to date.  ADDITIONAL GENETIC TESTING: We discussed with Mr. Gopal that there are other genes that are associated with increased cancer risk that can be analyzed. The laboratories that offer this testing look at these additional genes via a hereditary cancer gene panel. Should Mr. Mcclune wish to pursue additional genetic testing, we are happy to discuss and coordinate this testing, at any time.    CANCER SCREENING RECOMMENDATIONS:  Based on these negative genetic test results, there areno additional cancer risks we are aware of for Mr. Fluegel, and no additional screening or medical management that we would recommend for him at this time based on genetic testing.    This  result is reassuring and indicates that it is unlikely Mr. Vantine has an increased risk for a future cancer due to a mutation in one of these genes. This normal test also suggests that Mr. Hausen cancer was most likely not due to an inherited predisposition associated with one of these genes.  Most cancers happen by chance and this negative test suggests that his cancer may fall into this category.  Therefore, it is recommended he continue to follow the cancer management and screening guidelines provided by his oncology and primary healthcare provider. Other factors such as her personal and family history may still affect his cancer risk.  RECOMMENDATIONS FOR FAMILY MEMBERS: Individuals in this family might be at some increased risk of developing cancer, over the general population risk, simply due to the family history of cancer. We recommended women in this family have a yearly mammogram beginning at age 43, or 70 years younger than the earliest onset of cancer, an annual clinical breast exam, and perform monthly breast self-exams. Women in this family  should also have a gynecological exam as recommended by their primary provider. All family members should have a colonoscopy by age 50.  Based on Mr. Renstrom family history, we recommended his siblings, nieces,nephews, and other individuals related to his sister with ovarian cancer, have genetic counseling and testing due to the family history of ovarian cancer. Mr. Briggs will let us know if we can be of any assistance in coordinating genetic counseling and/or testing for this family member.   FOLLOW-UP: Lastly, we discussed with Mr. Bradway that cancer genetics is a rapidly advancing field and it is possible that new genetic tests will be appropriate for him and/or his family members in the future. We encouraged him to remain in contact with cancer genetics on an annual basis so we can update his personal and family histories and let him know of advances  in cancer genetics that may benefit this family.   Our contact number was provided. Mr. Shabazz questions were answered to his satisfaction, and he knows he is welcome to call us at anytime with additional questions or concerns.   Ferol Luz, MS Genetic Counselor [email or web address]

## 2017-03-30 NOTE — Telephone Encounter (Signed)
Revealed negative genetic testing.  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  Discussed that we do not know why he has pancreatic cancer or why there is cancer in the family. It could be due to a different gene that we are not testing, or maybe our current technology may not be able to pick something up.  It will be important for him to keep in contact with genetics to keep up with whether additional testing may be needed.

## 2017-03-31 ENCOUNTER — Telehealth: Payer: Self-pay | Admitting: Oncology

## 2017-03-31 NOTE — Telephone Encounter (Signed)
sw pt wife to confirm added chemo ed class 8/14 at noon per sch msg

## 2017-04-01 LAB — CUP PACEART REMOTE DEVICE CHECK
Brady Statistic RA Percent Paced: 0 %
Brady Statistic RV Percent Paced: 94 %
Date Time Interrogation Session: 20180723042400
HIGH POWER IMPEDANCE MEASURED VALUE: 36 Ohm
Implantable Lead Implant Date: 20050429
Implantable Lead Location: 753858
Implantable Lead Location: 753859
Implantable Lead Model: 5076
Implantable Lead Serial Number: 154821
Implantable Pulse Generator Implant Date: 20101111
Lead Channel Impedance Value: 383 Ohm
Lead Channel Impedance Value: 436 Ohm
Lead Channel Impedance Value: 480 Ohm
Lead Channel Pacing Threshold Amplitude: 0.5 V
Lead Channel Setting Pacing Amplitude: 2 V
Lead Channel Setting Pacing Pulse Width: 0.4 ms
Lead Channel Setting Sensing Sensitivity: 1 mV
MDC IDC LEAD IMPLANT DT: 20050429
MDC IDC LEAD IMPLANT DT: 20050429
MDC IDC LEAD LOCATION: 753860
MDC IDC LEAD SERIAL: 406736
MDC IDC MSMT BATTERY REMAINING LONGEVITY: 30 mo
MDC IDC MSMT BATTERY REMAINING PERCENTAGE: 50 %
MDC IDC MSMT LEADCHNL LV PACING THRESHOLD AMPLITUDE: 1.4 V
MDC IDC MSMT LEADCHNL LV PACING THRESHOLD PULSEWIDTH: 0.4 ms
MDC IDC MSMT LEADCHNL RA PACING THRESHOLD PULSEWIDTH: 0.4 ms
MDC IDC MSMT LEADCHNL RV PACING THRESHOLD AMPLITUDE: 0.6 V
MDC IDC MSMT LEADCHNL RV PACING THRESHOLD PULSEWIDTH: 0.4 ms
MDC IDC SET LEADCHNL LV PACING AMPLITUDE: 2.4 V
MDC IDC SET LEADCHNL LV PACING PULSEWIDTH: 0.4 ms
MDC IDC SET LEADCHNL RV PACING AMPLITUDE: 2.4 V
MDC IDC SET LEADCHNL RV SENSING SENSITIVITY: 0.5 mV
Pulse Gen Serial Number: 587847

## 2017-04-06 ENCOUNTER — Other Ambulatory Visit: Payer: Medicare Other

## 2017-04-07 ENCOUNTER — Ambulatory Visit
Admission: RE | Admit: 2017-04-07 | Discharge: 2017-04-07 | Disposition: A | Discharge status: Home / Self Care | Payer: Medicare Other | Source: Ambulatory Visit | Attending: Radiation Oncology | Admitting: Radiation Oncology

## 2017-04-07 ENCOUNTER — Telehealth: Payer: Self-pay | Admitting: Pharmacy Technician

## 2017-04-07 ENCOUNTER — Other Ambulatory Visit: Payer: Medicare Other

## 2017-04-07 ENCOUNTER — Other Ambulatory Visit: Payer: Self-pay

## 2017-04-07 ENCOUNTER — Other Ambulatory Visit: Payer: Self-pay | Admitting: *Deleted

## 2017-04-07 ENCOUNTER — Ambulatory Visit: Payer: Medicare Other

## 2017-04-07 ENCOUNTER — Encounter: Payer: Self-pay | Admitting: *Deleted

## 2017-04-07 ENCOUNTER — Telehealth: Payer: Self-pay | Admitting: *Deleted

## 2017-04-07 ENCOUNTER — Ambulatory Visit (HOSPITAL_BASED_OUTPATIENT_CLINIC_OR_DEPARTMENT_OTHER): Payer: Medicare Other | Admitting: Oncology

## 2017-04-07 ENCOUNTER — Telehealth: Payer: Self-pay | Admitting: Pharmacist

## 2017-04-07 ENCOUNTER — Encounter: Payer: Self-pay | Admitting: Radiation Oncology

## 2017-04-07 ENCOUNTER — Other Ambulatory Visit (HOSPITAL_BASED_OUTPATIENT_CLINIC_OR_DEPARTMENT_OTHER): Payer: Medicare Other

## 2017-04-07 ENCOUNTER — Ambulatory Visit (HOSPITAL_BASED_OUTPATIENT_CLINIC_OR_DEPARTMENT_OTHER): Payer: Medicare Other

## 2017-04-07 ENCOUNTER — Ambulatory Visit (HOSPITAL_COMMUNITY)
Admission: RE | Admit: 2017-04-07 | Discharge: 2017-04-07 | Disposition: A | Discharge status: Home / Self Care | Payer: Medicare Other | Source: Ambulatory Visit | Attending: Oncology | Admitting: Oncology

## 2017-04-07 VITALS — BP 114/38 | HR 76 | Temp 98.1°F | Resp 20

## 2017-04-07 VITALS — BP 100/36 | HR 70 | Temp 98.1°F | Resp 18 | Ht 69.0 in | Wt 180.8 lb

## 2017-04-07 DIAGNOSIS — C259 Malignant neoplasm of pancreas, unspecified: Secondary | ICD-10-CM

## 2017-04-07 DIAGNOSIS — C251 Malignant neoplasm of body of pancreas: Secondary | ICD-10-CM | POA: Diagnosis not present

## 2017-04-07 DIAGNOSIS — D509 Iron deficiency anemia, unspecified: Secondary | ICD-10-CM | POA: Diagnosis not present

## 2017-04-07 DIAGNOSIS — I4891 Unspecified atrial fibrillation: Secondary | ICD-10-CM | POA: Diagnosis not present

## 2017-04-07 DIAGNOSIS — D62 Acute posthemorrhagic anemia: Secondary | ICD-10-CM | POA: Diagnosis not present

## 2017-04-07 DIAGNOSIS — E119 Type 2 diabetes mellitus without complications: Secondary | ICD-10-CM

## 2017-04-07 DIAGNOSIS — N189 Chronic kidney disease, unspecified: Secondary | ICD-10-CM

## 2017-04-07 DIAGNOSIS — K6289 Other specified diseases of anus and rectum: Secondary | ICD-10-CM

## 2017-04-07 HISTORY — DX: Genetic susceptibility to other malignant neoplasm: Z15.09

## 2017-04-07 HISTORY — DX: Genetic carrier of other disease: Z14.8

## 2017-04-07 LAB — COMPREHENSIVE METABOLIC PANEL
ALBUMIN: 2.7 g/dL — AB (ref 3.5–5.0)
ALK PHOS: 80 U/L (ref 40–150)
ALT: 13 U/L (ref 0–55)
ANION GAP: 7 meq/L (ref 3–11)
AST: 15 U/L (ref 5–34)
BILIRUBIN TOTAL: 0.26 mg/dL (ref 0.20–1.20)
BUN: 30.3 mg/dL — ABNORMAL HIGH (ref 7.0–26.0)
CALCIUM: 9 mg/dL (ref 8.4–10.4)
CO2: 26 meq/L (ref 22–29)
CREATININE: 1.5 mg/dL — AB (ref 0.7–1.3)
Chloride: 110 mEq/L — ABNORMAL HIGH (ref 98–109)
EGFR: 54 mL/min/{1.73_m2} — AB (ref >90)
Glucose: 167 mg/dl — ABNORMAL HIGH (ref 70–140)
Potassium: 5 mEq/L (ref 3.5–5.1)
Sodium: 142 mEq/L (ref 136–145)
TOTAL PROTEIN: 6.9 g/dL (ref 6.4–8.3)

## 2017-04-07 LAB — CBC WITH DIFFERENTIAL/PLATELET
BASO%: 0.5 % (ref 0.0–2.0)
Basophils Absolute: 0 10*3/uL (ref 0.0–0.1)
EOS ABS: 0.1 10*3/uL (ref 0.0–0.5)
EOS%: 2.1 % (ref 0.0–7.0)
HEMATOCRIT: 21.4 % — AB (ref 38.4–49.9)
HGB: 6.6 g/dL — CL (ref 13.0–17.1)
LYMPH#: 0.6 10*3/uL — AB (ref 0.9–3.3)
LYMPH%: 10.9 % — AB (ref 14.0–49.0)
MCH: 22.6 pg — ABNORMAL LOW (ref 27.2–33.4)
MCHC: 31 g/dL — ABNORMAL LOW (ref 32.0–36.0)
MCV: 72.9 fL — AB (ref 79.3–98.0)
MONO#: 0.6 10*3/uL (ref 0.1–0.9)
MONO%: 11.6 % (ref 0.0–14.0)
NEUT%: 74.9 % (ref 39.0–75.0)
NEUTROS ABS: 3.9 10*3/uL (ref 1.5–6.5)
PLATELETS: 221 10*3/uL (ref 140–400)
RBC: 2.94 10*6/uL — AB (ref 4.20–5.82)
RDW: 17.5 % — ABNORMAL HIGH (ref 11.0–14.6)
WBC: 5.2 10*3/uL (ref 4.0–10.3)

## 2017-04-07 LAB — PREPARE RBC (CROSSMATCH)

## 2017-04-07 LAB — ABO/RH: ABO/RH(D): O POS

## 2017-04-07 LAB — FERRITIN: FERRITIN: 92 ng/mL (ref 22–316)

## 2017-04-07 MED ORDER — SODIUM CHLORIDE 0.9 % IV SOLN
250.0000 mL | Freq: Once | INTRAVENOUS | Status: DC
Start: 2017-04-07 — End: 2017-04-07

## 2017-04-07 MED ORDER — CAPECITABINE 500 MG PO TABS: ORAL_TABLET | ORAL | 0 refills | Status: DC

## 2017-04-07 MED ORDER — SODIUM CHLORIDE 0.9 % IV SOLN
250.0000 mL | Freq: Once | INTRAVENOUS | Status: AC
  Administered 2017-04-07: 250 mL via INTRAVENOUS

## 2017-04-07 MED FILL — XELODA 500 MG TABLET: 500 | 30 days supply | Qty: 140 | Fill #0

## 2017-04-07 NOTE — Telephone Encounter (Signed)
thanks

## 2017-04-07 NOTE — Telephone Encounter (Signed)
Called Dr. Gearldine Shown Nurse phone [phone], spoke with I believe Melissa, she will look into the status of Xeloda and call us 10:22 AM

## 2017-04-07 NOTE — Progress Notes (Signed)
  [facility] OFFICE PROGRESS NOTE   Diagnosis: Pancreas cancer, anemia  INTERVAL HISTORY:   [name] [name] presented today for a chemotherapy teaching class and baseline laboratory studies. The hemoglobin returned low. He denies bleeding. The stool is dark while on iron replacement. No nausea or abdominal pain. No new complaint. He is scheduled to begin radiation and Xeloda on 04/13/2017.  Objective:  Vital signs in last 24 hours:  Blood pressure (!) 100/36, pulse 70, temperature 98.1 F (36.7 C), resp. rate 18, height 5\' 9"  (1.753 m), weight 180 lb 12.8 oz (82 kg), SpO2 100 %.    Resp: Lungs clear bilaterally Cardio: Regular rate and rhythm GI: No hepatosplenomegaly, nontender, no mass   Lab Results:  Lab Results  Component Value Date   WBC 5.2 04/07/2017   HGB 6.6 (LL) 04/07/2017   HCT 21.4 (L) 04/07/2017   MCV 72.9 (L) 04/07/2017   PLT 221 04/07/2017   NEUTROABS 3.9 04/07/2017    CMP     Component Value Date/Time   NA 142 04/07/2017 1125   K 5.0 04/07/2017 1125   CL 99 (L) 01/08/2017 0815   CO2 26 04/07/2017 1125   GLUCOSE 167 (H) 04/07/2017 1125   BUN 30.3 (H) 04/07/2017 1125   CREATININE 1.5 (H) 04/07/2017 1125   CALCIUM 9.0 04/07/2017 1125   PROT 6.9 04/07/2017 1125   ALBUMIN 2.7 (L) 04/07/2017 1125   AST 15 04/07/2017 1125   ALT 13 04/07/2017 1125   ALKPHOS 80 04/07/2017 1125   BILITOT 0.26 04/07/2017 1125   GFRNONAA 55 (L) 01/08/2017 0815   GFRAA >60 01/08/2017 0815    Medications: I have reviewed the patient's current medications.  Assessment/Plan: 1. Pancreas cancer, pancreas body mass on CT 12/08/2016  CT 12/08/2016 consistent with a pancreas body mass and liver lesions suspicious for metastases  EUS on 12/15/2016 confirmed a pancreas body mass, T3 N0, biopsy consistent with adenocarcinoma  Abdominal ultrasound 02/26/2017-pancreas mass, no liver lesions apparent  PET scan 03/19/2017-enlargement of pancreas mass with vascular  encasement and new involvement of the tail of the pancreas, mildly hypermetabolic chest and porta hepatis nodes. Possible hypermetabolic activity at O2-DXAJOIN to represent a benign etiology  2. Acute cholecystitis February 2018, status post placement of a cholecystostomy tube  3. Diabetes  4. Congestive heart failure;hospitalized May 2018 with CHF  5. Peripheral vascular disease, status post bilateral BKA  6. Atrial fibrillation  7. Microcytic anemia-progressive, iron deficiency?, Bleeding related to Xarelto?  8. C. difficile colitis April 2018  9. Chronic renal failure  10. Anorectal hyperenhancing lesion noted on CT 12/08/2016    Disposition:  [name] [name] appears unchanged. The plan is to begin palliative radiation/Xeloda on 04/13/2017. He presents today with severe anemia. He appears asymptomatic. He denies bleeding.  The anemia is most likely related to an occult bleeding source while on Xarelto. We discussed the risks of a Red cell transfusion. He has received red cell transfusions in the past. He agrees to proceed with a Red cell transfusion today.  He will return a set of stool Hemoccult cards. [name] [name] will contact us for bleeding or symptoms of anemia.  He will begin ferrous sulfate 3 times per day.  The CBC will be repeated when he is here on 04/13/2017 and 04/20/2017.  25 minutes were spent with the patient today. The majority of the time was used for counseling and coordination of care.   Donneta Romberg, MD  04/07/2017  1:38 PM

## 2017-04-07 NOTE — Telephone Encounter (Signed)
Oral Chemotherapy Pharmacist Encounter   Patient received education today about new oral chemotherapy medication: Xeloda for the treatment of metastatic pancreatic cancer in conjunction with radiation, planned duration five and 1/2 weeks.  Pt is doing well. The prescription is being filled at the [facility], copayment $0 with foundation copayment grant secured by oral oncology patient advocate.  Medication will be ready for pick-up today 04/07/17 Planned Xeloda start date: 04/13/17  Patient counseled on administration, dosing, side effects, safe handling, and monitoring. Patient will take Xeloda 500mg  tablets, 3 tablets (1500mg ) by mouth in AM and 2 tabs (1000mg ) by mouth in PM, within 30 minutes of meals, on days of radiation only.  Side effects include but not limited to: hand-foot syndrome, fatigue, decreased blood counts, GI upset, and diarrhea.    Reviewed with patient importance of keeping a medication schedule and plan for any missed doses.  Mr. Cruey voiced understanding and appreciation.   All questions answered.  Patient knows to call the office with questions or concerns. Oral Oncology Clinic will continue to follow.  Thank you,  Johny Drilling, PharmD, BCPS, BCOP 04/07/2017  4:54 PM Oral Oncology Clinic [phone]

## 2017-04-07 NOTE — Addendum Note (Signed)
Encounter addended by: Doreen Beam, RN on: 04/07/2017 12:18 PM<BR>    Actions taken: Pend clinical note

## 2017-04-07 NOTE — Progress Notes (Signed)
Follow up new consult Pancreatic cancer, ([facility]vs body and tail)  Genetic testing   done 03/30/17 negative   Assessment/Plan:Dr. Glori Bickers 03/27/17"  1. Pancreas cancer, pancreas body mass on CT 12/08/2016  CT 12/08/2016 consistent with a pancreas body mass and liver lesions suspicious for metastases  EUS on 12/15/2016 confirmed a pancreas body mass, T3 N0, biopsy consistent with adenocarcinoma  Abdominal ultrasound 02/26/2017-pancreas mass, no liver lesions apparent  PET scan 03/19/2017-enlargement of pancreas mass with vascular encasement and new involvement of the tail of the pancreas, mildly hypermetabolic chest and porta hepatis nodes. Possible hypermetabolic activity at [id number] to represent a benign etiology His case was presented at the GI tumor conference on 03/25/2017. He is not a surgical candidate. The recommendation is to consider palliative radiation/Xeloda or systemic therapy. I discussed options with [name] [name]. He agrees to proceed with a course of palliative radiation and Xeloda. He understands no therapy will be curative. The goal of radiation is to prevent development of pain and bowel obstruction/invasion by tumor.  We can consider gemcitabine/Abraxane chemotherapy in the future.,  Patient B/L BKA  \BP (!) 114/38 Comment: LFA sitting  Pulse 76   Temp 98.1 F (36.7 C) (Oral)   Resp 20   SpO2 100%   Wt Readings from Last 3 Encounters:  03/27/17 177 lb 8 oz (80.5 kg)  03/17/17 183 lb 12.8 oz (83.4 kg)  02/09/17 179 lb 12.8 oz (81.6 kg)    Ct simulation today

## 2017-04-07 NOTE — Telephone Encounter (Signed)
Oral Oncology Patient Advocate Encounter  Was successful in securing patient a $4500 grant from [employer] to provide copayment coverage for Xeloda.  This will keep the out of pocket expense at $0.    I have spoken with the patient and his wife.    The billing information is as follows and has been shared with [place].   Member ID: [id number] Group ID: [id number] RxBin: [id number] RXPCN: [id number]  The pharmacy is getting the prescription for pickup this afternoon.     [name] [name]. [name], [place] Patient [place] [phone] 04/07/2017 1:33 PM

## 2017-04-07 NOTE — Progress Notes (Signed)
Please see the Nurse Progress Note in the MD Initial Consult Encounter for this patient. 

## 2017-04-07 NOTE — Addendum Note (Signed)
Encounter addended by: Doreen Beam, RN on: 04/07/2017  1:28 PM<BR>    Actions taken: Sign clinical note

## 2017-04-07 NOTE — Progress Notes (Signed)
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   HISTORY OF PRESENT ILLNESS: [name] [name] is a 71 y.o. male patient of Dr. Kalman Drape with pancreas cancer. The patient presented in February 2018 to the ED with symptoms of abdominal pain, and was found to have acute cholecystitis. Given his history of CHF he could not undergo surgery and had a cholecystotomy tube placed. He has been readmitted for anorexia, weight loss, and malfunction of the C tube. He was found during his hospitalization in April 2018 to have a 13 mm subcapsular lesion in the left liver, and a hypovascular change in the hepatic dome. A 2.1 x 2.5 cm mass in the body of the pancreas was seen as well as a 1.67 cm lesion in the anorectal junction without adenopathy. He underwent EUS on 12/15/16 with Dr. Dulce Sellar and no liver lesions were seen but the mass in the pancreas was a T3N0 lesion, and biopsies confirmed adenocarcinoma. He was seen and offered hospice, but sought a second opinion with Dr. Truett Perna and has undergone additional work up with a PET scan on 03/19/17 which revealed an 8 mm hilar node with an SUV of 6.2, a right infrahilar node with an SUV of 5.4, and infrahilar node with an SUV of 4.1. The large centrally necrotic pancreatic body mass measured 7.1 x 5.4 cm, and maximum SUV is 8.7. His case has been discussed at GI conference again and recommendations were made for concurrent radiotherapy with xeloda. He will also consider Gemzar/Abraxane following this with Dr. Truett Perna. He comes today to discuss proceeding with radiotherapy.  PREVIOUS RADIATION THERAPY: No   PAST MEDICAL HISTORY:  Past Medical History:  Diagnosis Date  . Anemia    takes Iron  pill daily  . Arthritis   . Automatic implantable cardioverter-defibrillator in situ    a. 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  . CAD (coronary artery disease)   . Cardiomyopathy, nonischemic (HCC)    a. Cath 2003: mild nonobstructive CAD, EF 25% at that time.  . Chronic systolic CHF (congestive heart failure) (HCC)    takes Furosemide daily as well as Aldactone  . CKD (chronic kidney disease), stage III   . Complication of anesthesia   . Constipation    takes Sennokot daily  . Dementia   . Family history of ovarian cancer   . Gangrenous toe, Lt toe 09/21/2013  . 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 Checked in July 2017 with [employer] Panel  . GERD (gastroesophageal reflux disease)    takes Nexium daily  . Headache    occasionally  . High cholesterol    takes Atorvastatin daily  . History of blood transfusion    no abnormal reaction noted  . History of bronchitis    > 1 yr ago  . History of kidney stones   . Hypertension    takes Coreg daily  . LBBB (left bundle branch block)    takes Coumadin daily  . NSVT (nonsustained ventricular tachycardia) (HCC)    a. Noted on ICD interrogation in 2011.  [name] PAD (peripheral artery disease) (HCC)    a. 08/2013 Periph Angio/PTA:  Abd Ao nl, RLE- 3v runoff, PT diff dzs, AT 90p, LLE 2v runoff, PT 100, AT 25m(diamondback ORA/chocolate balloon PTA).  .Marland KitchenPAF (paroxysmal atrial fibrillation) ([place]    a. Noted on ICD interrogation 2012;  b. coumadin d/c'd 01/2013.  .Marland KitchenPAF (paroxysmal atrial fibrillation) ([place]   . Pancreatic cancer ([place]   . Peripheral neuropathy    hands;numbness and tingling   . Pneumonia    hx of > 89yrgo  . PONV (postoperative nausea and vomiting)   . Presence of permanent cardiac pacemaker   . Type II diabetes mellitus ([facility]   takes Levemir daily as well as Novolog  . Urinary frequency   . Urinary  urgency        PAST SURGICAL HISTORY: Past Surgical History:  Procedure Laterality Date  . ABDOMINAL ANGIOGRAM  09/12/2013   Procedure: ABDOMINAL ANGIOGRAM;  Surgeon: JoLorretta HarpMD;  [facility];  Service: Cardiovascular;;  . AMPUTATION Left 10/04/2013   Procedure: LEFT GREAT TOE AND SECOND TOE AMPUTATION;  Surgeon: NaMarianna PaymentMD;  [facility] Service: Orthopedics;  Laterality: Left;  . AMPUTATION Left 11/09/2013   Procedure: LEFT AMPUTATION BELOW KNEE;  Surgeon: NaMarianna PaymentMD;  [facility] [place] Service: [facility]ics;  Laterality: Left;  . AMPUTATION Right 01/04/2014   Procedure: GrDoristine Devoidecond and third toe amputation;  Surgeon: NaMarianna PaymentMD;  [place] [facility] Service: Orthopedics;  Laterality: Right;  . AMPUTATION Right 01/30/2014   Procedure: RIGHT BELOW KNEE AMPUTATION;  Surgeon: NaMarianna PaymentMD;  [place] [facility]: [place]ics;  Laterality: Right;  . ATHERECTOMY Right 12/29/2013   Procedure: ATHERECTOMY;  Surgeon: JoLorretta HarpMD;  [facility];  Service: Cardiovascular;  Laterality: Right;  Anterior Tibial Artery  . CARDIAC CATHETERIZATION  10/01/2001   THERE WAS GLOBAL HYPOKINESIS AND EF 25%. THERE APPEARED TO BE GLOBAL DECREASE IN WALL MOTION  . CARDIAC DEFIBRILLATOR PLACEMENT  06/2009   WITH GENERATOR REPLACED; BiV ICD  . CARDIOVASCULAR STRESS TEST  03/20/2009   EF 33%  . [place]. COLONOSCOPY    . ESOPHAGOGASTRODUODENOSCOPY    . EUS Left 12/15/2016   Procedure: UPPER ENDOSCOPIC ULTRASOUND (EUS) LINEAR;  Surgeon: WiArta SilenceMD;  [facility];  Service: Endoscopy;  Laterality: Left;  . HIP ARTHROPLASTY Left 12/28/2015   Procedure: POSTERIOR  APPROACH HEMI HIP ARTHROPLASTY;  Surgeon: NaLeandrew KoyanagiMD;  [place] [facility] [facility]: [place]ics;  Laterality: Left;  . IR CATHETER TUBE CHANGE  12/08/2016  . IR GENERIC HISTORICAL  10/18/2016   IR PERC CHOLECYSTOSTOMY 10/18/2016 MiGreggory KeenMD [facility]-INTERV RAD  . IR RADIOLOGIST EVAL & MGMT  11/20/2016  . IR RADIOLOGIST EVAL & MGMT  12/31/2016  . IR SINUS/FIST TUBE CHK-NON GI  01/14/2017  . LEG AMPUTATION BELOW KNEE Left 11/09/2013   DR XUErlinda Hong. LITHOTRIPSY  2001  . LOWER EXTREMITY ANGIOGRAM Bilateral 09/12/2013   Procedure: LOWER EXTREMITY ANGIOGRAM;  Surgeon: JoLorretta HarpMD;  [facility];  Service: Cardiovascular;  Laterality: Bilateral;  . LOWER EXTREMITY ANGIOGRAM N/A 12/29/2013   Procedure: LOWER EXTREMITY ANGIOGRAM;  Surgeon: JoLorretta HarpMD;  [facility];  Service: Cardiovascular;  Laterality: N/A;  . STUMP REVISION Left 01/03/2015   Procedure: LEFT BELOW KNEE AMPUTATION REVISION;  Surgeon: NaLeandrew KoyanagiMD;  [place] [facility] [facility]: [facility];  Laterality: Left;  . TOE  AMPUTATION  10/04/2013   LEFT GREAT TOE AND 4TH TOE   /   DR Roda Shutters  . TRANSLUMINAL ATHERECTOMY TIBIAL ARTERY Left 09/12/2013  . US ECHOCARDIOGRAPHY  03/21/2008   EF 30-35%     FAMILY HISTORY:  Family History  Problem Relation Age of Onset  . Heart disease Mother   . Hypertension Mother   . Diabetes Mother   . Diabetes Father   . Ovarian cancer Sister 73       died in her 84's     SOCIAL HISTORY:  reports that he has never smoked. He has never used smokeless tobacco. He reports that he does not drink alcohol or use drugs. The patient is married and lives in [place]. He retired from working in [employer].   ALLERGIES: Patient has no known allergies.   MEDICATIONS:  Current Outpatient Prescriptions  Medication Sig Dispense Refill  . acetaminophen (TYLENOL) 325 MG tablet Take 2 tablets (650 mg total) by mouth every 6 (six) hours as needed for mild pain (or Fever >/= 101).    [name] alum & mag hydroxide-simeth (MAALOX/MYLANTA) 200-200-20 MG/5ML suspension Take 15 mLs by mouth every 4 (four) hours as needed for indigestion or heartburn. 355 mL 0  . atorvastatin (LIPITOR) 10 MG tablet Take 1 tablet (10 mg  total) by mouth daily. 30 tablet 1  . carvedilol (COREG) 6.25 MG tablet Take 1 tablet (6.25 mg total) by mouth 2 (two) times daily with a meal. 60 tablet 0  . cholecalciferol (VITAMIN D) 1000 units tablet Take 1,000 Units by mouth daily.    . furosemide (LASIX) 40 MG tablet Take 40 mg by mouth daily.  5  . insulin detemir (LEVEMIR) 100 UNIT/ML injection Inject 0.15 mLs (15 Units total) into the skin 2 (two) times daily. 10 mL 0  . iron polysaccharides (NIFEREX) 150 MG capsule Take 1 capsule (150 mg total) by mouth daily. 30 capsule 1  . Multiple Vitamin (MULTIVITAMIN WITH MINERALS) TABS tablet Take 1 tablet by mouth daily.    [name] omeprazole (PRILOSEC) 20 MG capsule Take 20 mg by mouth daily.    . potassium chloride SA (K-DUR,KLOR-CON) 20 MEQ tablet Take 1 tablet (20 mEq total) by mouth 2 (two) times daily. 60 tablet 0  . pregabalin (LYRICA) 75 MG capsule Take 1 capsule (75 mg total) by mouth 2 (two) times daily. 60 capsule 1  . protein supplement shake (PREMIER PROTEIN) LIQD Take 325 mLs (11 oz total) by mouth 2 (two) times daily between meals. 60 Can 0  . rivaroxaban (XARELTO) 20 MG TABS tablet Take 1 tablet (20 mg total) by mouth daily with supper. 30 tablet 0  . saccharomyces boulardii (FLORASTOR) 250 MG capsule Take 1 capsule (250 mg total) by mouth 2 (two) times daily. 14 capsule 0  . sacubitril-valsartan (ENTRESTO) 24-26 MG Take 1 tablet by mouth 2 (two) times daily. 60 tablet 6  . tamsulosin (FLOMAX) 0.4 MG CAPS capsule Take 1 capsule (0.4 mg total) by mouth daily after supper. 30 capsule 0   No current facility-administered medications for this encounter.      REVIEW OF SYSTEMS: On review of systems, the patient reports that he is doing well overall. He denies any chest pain, shortness of breath, cough, fevers, chills, night sweats, unintended weight changes. He has not had any additional concerns since his C tube fell out. He denies any bowel or bladder disturbances, and denies abdominal  pain, nausea or vomiting. He denies any new musculoskeletal  or joint aches or pains, new skin lesions or concerns. A complete review of systems is obtained and is otherwise negative.    PHYSICAL EXAM:  Wt Readings from Last 3 Encounters:  03/27/17 177 lb 8 oz (80.5 kg)  03/17/17 183 lb 12.8 oz (83.4 kg)  02/09/17 179 lb 12.8 oz (81.6 kg)   Temp Readings from Last 3 Encounters:  03/27/17 98.3 F (36.8 C) (Oral)  03/17/17 98.3 F (36.8 C) (Oral)  02/12/17 99.2 F (37.3 C) (Oral)   BP Readings from Last 3 Encounters:  03/27/17 103/62  03/17/17 (!) 126/51  02/12/17 (!) 120/52   Pulse Readings from Last 3 Encounters:  03/27/17 77  03/17/17 75  02/12/17 100    /10  In general this is a somewhat chronically ill appearing African American male in no acute distress. He is alert and oriented x4 and appropriate throughout the examination. HEENT reveals that the patient is normocephalic, atraumatic. EOMs are intact. PERRLA. Skin is intact without any evidence of gross lesions. Cardiovascular exam reveals a regular rate and rhythm, no clicks rubs or murmurs are auscultated. Chest is clear to auscultation bilaterally. Lymphatic assessment is performed and does not reveal any adenopathy in the cervical, supraclavicular, axillary, or inguinal chains. He has a reducible umbilical hernia. Abdomen has active bowel sounds in all quadrants and is intact. The abdomen is soft, non tender, non distended. The previous cholecystotomy tube site is well healed. His lower extremities are assessed, he is a below the knee bilateral amputee, and the thighs are negative for pretibial pitting edema, deep calf tenderness, cyanosis or clubbing, .   ECOG = 2  0 - Asymptomatic (Fully active, able to carry on all predisease activities without restriction)  1 - Symptomatic but completely ambulatory (Restricted in physically strenuous activity but ambulatory and able to carry out work of a light or sedentary nature. For  example, light housework, office work)  2 - Symptomatic, <50% in bed during the day (Ambulatory and capable of all self care but unable to carry out any work activities. Up and about more than 50% of waking hours)  3 - Symptomatic, >50% in bed, but not bedbound (Capable of only limited self-care, confined to bed or chair 50% or more of waking hours)  4 - Bedbound (Completely disabled. Cannot carry on any self-care. Totally confined to bed or chair)  5 - Death   Eustace Pen MM, Creech RH, Tormey DC, et al. [phone]). "Toxicity and response criteria of the [facility]". [place] Oncol. 5 (6): 649-55    LABORATORY DATA:  Lab Results  Component Value Date   WBC 6.0 01/08/2017   HGB 9.4 (L) 01/08/2017   HCT 29.0 (L) 01/08/2017   MCV 73.8 (L) 01/08/2017   PLT 282 01/08/2017   Lab Results  Component Value Date   NA 135 01/08/2017   K 4.9 01/08/2017   CL 99 (L) 01/08/2017   CO2 28 01/08/2017   Lab Results  Component Value Date   ALT 56 01/07/2017   AST 50 (H) 01/07/2017   ALKPHOS 208 (H) 01/07/2017   BILITOT 1.2 01/07/2017      RADIOGRAPHY: Nm Pet Image Initial (pi) Skull Base To Thigh  Result Date: 03/19/2017 CLINICAL DATA:  Initial treatment strategy for pancreatic adenocarcinoma. EXAM: NUCLEAR MEDICINE PET SKULL BASE TO THIGH TECHNIQUE: 9.1 mCi F-18 FDG was injected intravenously. Full-ring PET imaging was performed from the skull base to thigh after the radiotracer. CT data was obtained and  used for attenuation correction and anatomic localization. FASTING BLOOD GLUCOSE:  Value: 133 mg/dl COMPARISON:  Multiple exams, including CT exam from 12/08/2016 FINDINGS: NECK No hypermetabolic lymph nodes in the neck. Chronic right maxillary sinusitis. Old right medial orbital wall fracture. There is atherosclerotic calcification of the cavernous carotid arteries bilaterally. CHEST There is a small indistinct right hilar node probably measuring 0.8 cm in short axis on image  66/4 with maximum SUV 6.2. A right infrahilar indistinct suspected node has a maximum SUV of 5.4 a separate more lateral right infrahilar node is indistinct with a maximum SUV of 4.1. Background mediastinal blood pool activity is 2.7. Bandlike density in the right lower lobe converge is in the posterior basal segment as on image 48/8. There is only low-grade metabolic activity in this region with a maximum SUV of 2.6. Dilated esophagus with air-fluid level. Coronary, aortic arch, and branch vessel atherosclerotic vascular disease. Cardiomegaly is present, specially involving the left ventricle. Pulse generator and leads noted involving the right atrial appendage and right ventricle. ABDOMEN/PELVIS There is a large centrally necrotic pancreatic body mass measuring 7.1 by 5.4 cm on image 103/4, formerly 2.5 by 2.1 cm. The maximum SUV of this mass is 8.7. The mass abuts the celiac trunk and its branches, and also abuts the anterior margin of the superior mesenteric artery. The mass abuts the portal vein and probably partially encases the splenic vein. The mass abuts the liver margin, stomach margin, and duodenum. The mass engulfs the superior mesenteric vein. Marked thickening and masslike nodularity along the pancreatic tail extending up into the left upper quadrant suspicious for metastatic spread, maximum SUV 5.5. There is an indistinct accentuated porta hepatis adenopathy, maximum SUV 4.2. No definite independent hepatic metastatic lesion is identified. Thick walled in indistinct gallbladder without hypermetabolic activity. Low-density blood pool. Aortoiliac atherosclerotic vascular disease. Small umbilical hernia contains adipose tissue and small bowel without evidence of obstruction or complicating feature. SKELETON Left hip hemiarthroplasty. Irregular lucency and possible compression along the superior endplate of T9 with very faintly accentuated metabolic activity, maximum SUV 4.7. This may well be a benign  superior endplate compression that name warrant further workup. I do not see a lesion in this vicinity on 12/08/2016. 1.2 cm sclerotic lesion medially in the left fifth rib on image 58/4, without associated hypermetabolic activity, possibly a bone island. IMPRESSION: 1. Rapidly enlarging and centrally necrotic pancreatic mass with local vascular encasement and abutment, hypermetabolic with maximum SUV 8.7, compatible with pancreatic malignancy. 2. Slight thickening along the pancreatic tail, likely representing local spread of disease. 3. Suspected indistinct but faintly hypermetabolic porta hepatis adenopathy. 4. Small but hypermetabolic right hilar and infrahilar lymph nodes, concerning for metastatic spread to the chest. 5. Irregular lucency and possible compression fracture with faint hypermetabolic activity along the superior endplate of T9. I favor a benign etiology that there was no compression back on 12/08/2016. If clinically warranted, MRI of the thoracic spine could be utilized for further characterization. 6. Other imaging findings of potential clinical significance: Chronic right maxillary sinusitis. Aortic Atherosclerosis (ICD10-I70.0). Coronary atherosclerosis with cardiomegaly. Low-density blood pool suggests anemia. Small umbilical hernia. Sclerotic left fifth rib lesion is not hypermetabolic and probably a bone island. Dilated esophagus with air- fluid level probably from dysmotility. Electronically Signed   By: Van Clines M.D.   On: 03/19/2017 14:17       IMPRESSION/PLAN: 1. Probable Stage IIA, cT3N0Mx adenocarcinoma of the pancreas. Dr. Lisbeth Renshaw discusses the pathology findings and reviews the most recent  results from his PET scan. We reviewed the discussion from conference and would offer him concurrent radiotherapy along with xeloda. He is awaiting his prescription. We will anticipate starting next Monday. He will have labs repeated to determine creatinine prior to proceeding with  simulation and contrast at that time. We discussed the risks, benefits, short, and long term effects of radiotherapy, and the patient is interested in proceeding. Dr. Lisbeth Renshaw discusses the delivery and logistics of radiotherapy and would anticipate a 5 1/2 week course of treatment. He will proceed with simulation today. Written consent is obtained and placed in the chart, a copy was provided to the patient.    In a visit lasting 25 minutes, greater than 50% of the time was spent face to face discussing the logistics of concurrent treatment, and coordinating the patient's care.  The above documentation reflects my direct findings during this shared patient visit. Please see the separate note by Dr. Lisbeth Renshaw on this date for the remainder of the patient's plan of care.    Carola Rhine, PAC

## 2017-04-07 NOTE — Progress Notes (Signed)
Has armband been applied?  Yes  Does patient have an allergy to IV contrast dye?: No   Has patient ever received premedication for IV contrast dye?: No  Does patient take metformin?: No  If patient does take metformin when was the last dose: No Date of lab work: 04/07/17 BUN: 30 CR: 1.5   Has IV site been added to flowsheet?  No, Iv contrast per Dr. Lisbeth Renshaw.  98.1, 114/38,76.20, 100% room air

## 2017-04-07 NOTE — Telephone Encounter (Signed)
Oral Oncology Pharmacist Encounter  Received new prescription for Xeloda for the treatment of metastatic pancreatic cancer in conjunction with radiation, planned duration five and 1/2 weeks.  Labs from 5/16 and 01/08/17 assessed, OK for treatment. New baseline labs to be obtained today (04/07/17)  Current medication list in Epic reviewed, no significant DDIs with Xeloda identified.  Prescription has been e-scribed to the [facility] for benefits analysis and approval. Copayment $551.87, copayment assistance will be discussed with patient at chemotherapy education class today.  Oral Oncology Clinic will continue to follow.  Johny Drilling, PharmD, BCPS, BCOP 04/07/2017 11:04 AM Oral Oncology Clinic [phone]

## 2017-04-07 NOTE — Telephone Encounter (Signed)
Checked patient's labs today on  BUN/Cr for ct simulation , per new [facility] protocol, patient can get a reduced dose contrast per Shona Simpson, PA, but then I checked patient's cbc, his hgb=6.6 dfown from 9.4, left message on Dr. Gearldine Shown nurse voice message phone 12:15 PM

## 2017-04-07 NOTE — Patient Instructions (Signed)

## 2017-04-08 ENCOUNTER — Ambulatory Visit
Admission: RE | Admit: 2017-04-08 | Discharge: 2017-04-08 | Disposition: A | Discharge status: Home / Self Care | Payer: Medicare Other | Source: Ambulatory Visit | Attending: Radiation Oncology | Admitting: Radiation Oncology

## 2017-04-08 ENCOUNTER — Ambulatory Visit: Payer: Medicare Other

## 2017-04-08 DIAGNOSIS — C251 Malignant neoplasm of body of pancreas: Secondary | ICD-10-CM

## 2017-04-08 DIAGNOSIS — D62 Acute posthemorrhagic anemia: Secondary | ICD-10-CM | POA: Diagnosis not present

## 2017-04-08 DIAGNOSIS — C259 Malignant neoplasm of pancreas, unspecified: Secondary | ICD-10-CM

## 2017-04-08 LAB — PREPARE RBC (CROSSMATCH)

## 2017-04-08 LAB — CANCER ANTIGEN 19-9: CA 19-9: 100 U/mL — ABNORMAL HIGH (ref 0–35)

## 2017-04-08 MED ORDER — SODIUM CHLORIDE 0.9 % IV SOLN: 250.0000 mL | Freq: Once | INTRAVENOUS | Status: DC

## 2017-04-08 NOTE — Patient Instructions (Signed)

## 2017-04-08 NOTE — Progress Notes (Signed)
D/C IV catheter left AC, tip intact, 2x2 gause x 2 placed over site and taped secured with tape, held presure 2 minutes, instructions given to leave in place for 3-4 hours before removing, ,warm blankest place around patient 2:43 PM

## 2017-04-09 LAB — BPAM RBC
BLOOD PRODUCT EXPIRATION DATE: 201809132359
Blood Product Expiration Date: 201809132359
ISSUE DATE / TIME: 201808151059
ISSUE DATE / TIME: 201808141635
UNIT TYPE AND RH: 5100
UNIT TYPE AND RH: 5100

## 2017-04-09 LAB — TYPE AND SCREEN
ABO/RH(D): O POS
ANTIBODY SCREEN: NEGATIVE
UNIT DIVISION: 0
Unit division: 0

## 2017-04-10 DIAGNOSIS — C251 Malignant neoplasm of body of pancreas: Secondary | ICD-10-CM | POA: Diagnosis not present

## 2017-04-13 ENCOUNTER — Ambulatory Visit
Admission: RE | Admit: 2017-04-13 | Discharge: 2017-04-13 | Disposition: A | Discharge status: Home / Self Care | Payer: Medicare Other | Source: Ambulatory Visit | Attending: Radiation Oncology | Admitting: Radiation Oncology

## 2017-04-13 DIAGNOSIS — C251 Malignant neoplasm of body of pancreas: Secondary | ICD-10-CM | POA: Diagnosis not present

## 2017-04-14 ENCOUNTER — Ambulatory Visit (HOSPITAL_COMMUNITY): Payer: Medicare Other

## 2017-04-14 ENCOUNTER — Ambulatory Visit
Admission: RE | Admit: 2017-04-14 | Discharge: 2017-04-14 | Disposition: A | Discharge status: Home / Self Care | Payer: Medicare Other | Source: Ambulatory Visit | Attending: Radiation Oncology | Admitting: Radiation Oncology

## 2017-04-14 DIAGNOSIS — C251 Malignant neoplasm of body of pancreas: Secondary | ICD-10-CM | POA: Diagnosis not present

## 2017-04-15 ENCOUNTER — Ambulatory Visit
Admission: RE | Admit: 2017-04-15 | Discharge: 2017-04-15 | Disposition: A | Discharge status: Home / Self Care | Payer: Medicare Other | Source: Ambulatory Visit | Attending: Radiation Oncology | Admitting: Radiation Oncology

## 2017-04-15 DIAGNOSIS — C251 Malignant neoplasm of body of pancreas: Secondary | ICD-10-CM | POA: Diagnosis not present

## 2017-04-16 ENCOUNTER — Ambulatory Visit
Admission: RE | Admit: 2017-04-16 | Discharge: 2017-04-16 | Disposition: A | Discharge status: Home / Self Care | Payer: Medicare Other | Source: Ambulatory Visit | Attending: Radiation Oncology | Admitting: Radiation Oncology

## 2017-04-16 DIAGNOSIS — C251 Malignant neoplasm of body of pancreas: Secondary | ICD-10-CM | POA: Diagnosis not present

## 2017-04-17 ENCOUNTER — Telehealth: Payer: Self-pay | Admitting: *Deleted

## 2017-04-17 ENCOUNTER — Ambulatory Visit: Payer: Medicare Other

## 2017-04-17 NOTE — Telephone Encounter (Signed)
Call from pt's wife reporting he has had 4 loose stools in past 24 hours. He has taken 2 Imodium today. Instructed her to give him one after each loose stool. He can take up to 8 per day. Call office if diarrhea persists despite taking up to 8 Imodium. She voiced understanding.

## 2017-04-20 ENCOUNTER — Telehealth: Payer: Self-pay | Admitting: Internal Medicine

## 2017-04-20 ENCOUNTER — Ambulatory Visit
Admission: RE | Admit: 2017-04-20 | Discharge: 2017-04-20 | Disposition: A | Discharge status: Home / Self Care | Payer: Medicare Other | Source: Ambulatory Visit | Attending: Radiation Oncology | Admitting: Radiation Oncology

## 2017-04-20 ENCOUNTER — Telehealth: Payer: Self-pay

## 2017-04-20 DIAGNOSIS — C251 Malignant neoplasm of body of pancreas: Secondary | ICD-10-CM | POA: Diagnosis not present

## 2017-04-20 NOTE — Telephone Encounter (Signed)
Call placed to pt's wife as requested. Informed her that the radiation team can assess the pt's facial swelling at his radiation appointment today per MD [facility]. Pt's wife verbalizes understanding and agrees with plan of care.

## 2017-04-20 NOTE — Progress Notes (Signed)
Saw patient in the back  For wife c/o patient facial swelling,feels he is retaining fluid, he is on lasix, treatment to be done iand completed in 45 minutes, slight facial puffiness noted, no c/o pian, nause, appetite good, voiding well, just had a bm today normal stated, will need to go see his Primary Md in next 1-2 days per Bryson Ha, Our , PA, infomred wife and patient 1:27 PM Wt Readings from Last 3 Encounters:  04/07/17 180 lb 12.8 oz (82 kg)  03/27/17 177 lb 8 oz (80.5 kg)  03/17/17 183 lb 12.8 oz (83.4 kg)

## 2017-04-20 NOTE — Telephone Encounter (Signed)
New message    Pt c/o swelling: STAT is pt has developed SOB within 24 hours  1. How long have you been experiencing swelling? Started 3 days ago, really noticed yesterday  2. Where is the swelling located? Face, legs, L arm  3.  Are you currently taking a "fluid pill"? Yes  4.  Are you currently SOB? No   5.  Have you traveled recently? No

## 2017-04-20 NOTE — Telephone Encounter (Signed)
Lm to call back ./cy 

## 2017-04-21 ENCOUNTER — Ambulatory Visit
Admission: RE | Admit: 2017-04-21 | Discharge: 2017-04-21 | Disposition: A | Discharge status: Home / Self Care | Payer: Medicare Other | Source: Ambulatory Visit | Attending: Radiation Oncology | Admitting: Radiation Oncology

## 2017-04-21 DIAGNOSIS — C251 Malignant neoplasm of body of pancreas: Secondary | ICD-10-CM | POA: Diagnosis not present

## 2017-04-21 NOTE — Telephone Encounter (Signed)
Called patient listed telephone number with no answer, and left VM to call back [phone] opt. 5.

## 2017-04-21 NOTE — Telephone Encounter (Signed)
After reviewing medical record, it looks like pt had seen Dr Aundra Dubin in [facility] in March 2018. Pt's wife is aware I will forward this message to HF for review and follow-up.

## 2017-04-21 NOTE — Telephone Encounter (Signed)
I spoke with patient's wife. Pt's wife states for the last week pt's face, left arm, upper legs bilaterally have been puffy. Pt is a double amputee, unable to weigh. Pt's wife states she has not really noticed a change in his breathing but pt has had a cough at night for the last couple of nights.

## 2017-04-22 ENCOUNTER — Ambulatory Visit
Admission: RE | Admit: 2017-04-22 | Discharge: 2017-04-22 | Disposition: A | Discharge status: Home / Self Care | Payer: Medicare Other | Source: Ambulatory Visit | Attending: Radiation Oncology | Admitting: Radiation Oncology

## 2017-04-22 DIAGNOSIS — C251 Malignant neoplasm of body of pancreas: Secondary | ICD-10-CM | POA: Diagnosis not present

## 2017-04-22 NOTE — Telephone Encounter (Signed)
Increase Lasix to 40 mg po bid with BMET in 1 week.  Needs followup in CHF clinic asap.

## 2017-04-22 NOTE — Telephone Encounter (Signed)
Spoke with pt's wife she states pt. Is visibly swollen, having SOB with excertion, with a cough and unable to lay flat. Pt is taking furosemide 40 mg daily as prescribed. Pt also has weakness and reportedly fell 8/28 after getting radiation Tx and also takes chemo. Will send info to [facility] and call wife back with further instructions.

## 2017-04-22 NOTE — Telephone Encounter (Signed)
Left Pt. a VM to call back for futrher instructions.

## 2017-04-23 ENCOUNTER — Ambulatory Visit
Admission: RE | Admit: 2017-04-23 | Discharge: 2017-04-23 | Disposition: A | Discharge status: Home / Self Care | Payer: Medicare Other | Source: Ambulatory Visit | Attending: Radiation Oncology | Admitting: Radiation Oncology

## 2017-04-23 DIAGNOSIS — C251 Malignant neoplasm of body of pancreas: Secondary | ICD-10-CM | POA: Diagnosis not present

## 2017-04-23 MED ORDER — FUROSEMIDE 40 MG PO TABS
40.0000 mg | ORAL_TABLET | Freq: Two times a day (BID) | ORAL | 5 refills | Status: DC
Start: 2017-04-23 — End: 2017-05-15

## 2017-04-23 NOTE — Telephone Encounter (Signed)
Spoke w/pt's wife she is aware and agreeable, will increase med.  Pt has chemo everyday and has to get transportation so he is unable to come here next week, appt sch for 9/11 at 9:30 am, if worse before then she will call us back.

## 2017-04-24 ENCOUNTER — Ambulatory Visit
Admission: RE | Admit: 2017-04-24 | Discharge: 2017-04-24 | Disposition: A | Discharge status: Home / Self Care | Payer: Medicare Other | Source: Ambulatory Visit | Attending: Radiation Oncology | Admitting: Radiation Oncology

## 2017-04-24 DIAGNOSIS — C251 Malignant neoplasm of body of pancreas: Secondary | ICD-10-CM | POA: Diagnosis not present

## 2017-04-28 ENCOUNTER — Ambulatory Visit
Admission: RE | Admit: 2017-04-28 | Discharge: 2017-04-28 | Disposition: A | Discharge status: Home / Self Care | Payer: Medicare Other | Source: Ambulatory Visit | Attending: Radiation Oncology | Admitting: Radiation Oncology

## 2017-04-28 DIAGNOSIS — C251 Malignant neoplasm of body of pancreas: Secondary | ICD-10-CM | POA: Diagnosis not present

## 2017-04-28 NOTE — Progress Notes (Addendum)
  Radiation Oncology         [phone] ________________________________  Name: [name] [name] MRN: [id number]  Date: 04/08/2017  DOB: [birth year]  SIMULATION AND TREATMENT PLANNING NOTE  DIAGNOSIS:     ICD-10-CM   1. 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  Identity was confirmed.  All relevant records and images related to the planned course of therapy were reviewed.   Written consent to proceed with treatment was confirmed which was freely given after reviewing the details related to the planned course of therapy had been reviewed with the patient.  Then, the patient was set-up in a stable reproducible  supine position for radiation therapy.  CT images were obtained.  Surface markings were placed.    Medically necessary complex treatment device(s) for immobilization:  Vac-lock bag.   The CT images were loaded into the planning software.  Then the target and avoidance structures were contoured.  Treatment planning then occurred.  The radiation prescription was entered and confirmed. I have requested : Intensity Modulated Radiotherapy (IMRT) is medically necessary for this case for the following reason:  Sparing of critical adjacent normal structures including the kidneys, spinal cord and liver.   The patient will undergo daily image guidance to ensure accurate localization of the target, and adequate minimize dose to the normal surrounding structures in close proximity to the target.   PLAN:  The patient will receive 45 Gy in 25 fractions Initially. The patient will then receive a 9 Gy boost to yield a final dose of 54 gray.   Special treatment procedure The patient will also receive concurrent chemotherapy during the treatment. The patient may therefore experience increased toxicity or side effects and the patient will be monitored for such problems. This may require extra lab work as  necessary. This therefore constitutes a special treatment procedure.  ________________________________   [name], MD, PhD

## 2017-04-29 ENCOUNTER — Ambulatory Visit
Admission: RE | Admit: 2017-04-29 | Discharge: 2017-04-29 | Disposition: A | Discharge status: Home / Self Care | Payer: Medicare Other | Source: Ambulatory Visit | Attending: Radiation Oncology | Admitting: Radiation Oncology

## 2017-04-29 DIAGNOSIS — C251 Malignant neoplasm of body of pancreas: Secondary | ICD-10-CM | POA: Diagnosis not present

## 2017-04-30 ENCOUNTER — Ambulatory Visit
Admission: RE | Admit: 2017-04-30 | Discharge: 2017-04-30 | Disposition: A | Discharge status: Home / Self Care | Payer: Medicare Other | Source: Ambulatory Visit | Attending: Radiation Oncology | Admitting: Radiation Oncology

## 2017-04-30 DIAGNOSIS — C251 Malignant neoplasm of body of pancreas: Secondary | ICD-10-CM | POA: Diagnosis not present

## 2017-05-01 ENCOUNTER — Other Ambulatory Visit: Payer: Self-pay | Admitting: Internal Medicine

## 2017-05-01 ENCOUNTER — Ambulatory Visit
Admission: RE | Admit: 2017-05-01 | Discharge: 2017-05-01 | Disposition: A | Discharge status: Home / Self Care | Payer: Medicare Other | Source: Ambulatory Visit | Attending: Radiation Oncology | Admitting: Radiation Oncology

## 2017-05-01 DIAGNOSIS — C251 Malignant neoplasm of body of pancreas: Secondary | ICD-10-CM | POA: Diagnosis not present

## 2017-05-04 ENCOUNTER — Telehealth: Payer: Self-pay | Admitting: Oncology

## 2017-05-04 ENCOUNTER — Ambulatory Visit (HOSPITAL_BASED_OUTPATIENT_CLINIC_OR_DEPARTMENT_OTHER): Payer: Medicare Other | Admitting: Oncology

## 2017-05-04 ENCOUNTER — Ambulatory Visit
Admission: RE | Admit: 2017-05-04 | Discharge: 2017-05-04 | Disposition: A | Discharge status: Home / Self Care | Payer: Medicare Other | Source: Ambulatory Visit | Attending: Radiation Oncology | Admitting: Radiation Oncology

## 2017-05-04 ENCOUNTER — Other Ambulatory Visit (HOSPITAL_BASED_OUTPATIENT_CLINIC_OR_DEPARTMENT_OTHER): Payer: Medicare Other

## 2017-05-04 ENCOUNTER — Other Ambulatory Visit: Payer: Self-pay

## 2017-05-04 VITALS — BP 121/43 | HR 80 | Temp 98.2°F | Resp 17 | Ht 69.0 in | Wt 193.0 lb

## 2017-05-04 DIAGNOSIS — K6289 Other specified diseases of anus and rectum: Secondary | ICD-10-CM | POA: Diagnosis not present

## 2017-05-04 DIAGNOSIS — C259 Malignant neoplasm of pancreas, unspecified: Secondary | ICD-10-CM

## 2017-05-04 DIAGNOSIS — N189 Chronic kidney disease, unspecified: Secondary | ICD-10-CM

## 2017-05-04 DIAGNOSIS — E119 Type 2 diabetes mellitus without complications: Secondary | ICD-10-CM

## 2017-05-04 DIAGNOSIS — D509 Iron deficiency anemia, unspecified: Secondary | ICD-10-CM | POA: Diagnosis not present

## 2017-05-04 DIAGNOSIS — C251 Malignant neoplasm of body of pancreas: Secondary | ICD-10-CM

## 2017-05-04 DIAGNOSIS — K922 Gastrointestinal hemorrhage, unspecified: Secondary | ICD-10-CM

## 2017-05-04 DIAGNOSIS — I4891 Unspecified atrial fibrillation: Secondary | ICD-10-CM

## 2017-05-04 LAB — CBC WITH DIFFERENTIAL/PLATELET
BASO%: 0.3 % (ref 0.0–2.0)
BASOS ABS: 0 10*3/uL (ref 0.0–0.1)
EOS%: 6.6 % (ref 0.0–7.0)
Eosinophils Absolute: 0.3 10*3/uL (ref 0.0–0.5)
HCT: 22.8 % — ABNORMAL LOW (ref 38.4–49.9)
HGB: 7.5 g/dL — ABNORMAL LOW (ref 13.0–17.1)
LYMPH#: 0.1 10*3/uL — AB (ref 0.9–3.3)
LYMPH%: 2.6 % — ABNORMAL LOW (ref 14.0–49.0)
MCH: 24.8 pg — ABNORMAL LOW (ref 27.2–33.4)
MCHC: 32.8 g/dL (ref 32.0–36.0)
MCV: 75.8 fL — AB (ref 79.3–98.0)
MONO#: 0.3 10*3/uL (ref 0.1–0.9)
MONO%: 6.1 % (ref 0.0–14.0)
NEUT%: 84.4 % — AB (ref 39.0–75.0)
NEUTROS ABS: 3.8 10*3/uL (ref 1.5–6.5)
Platelets: 182 10*3/uL (ref 140–400)
RBC: 3 10*6/uL — AB (ref 4.20–5.82)
RDW: 26.3 % — AB (ref 11.0–14.6)
WBC: 4.5 10*3/uL (ref 4.0–10.3)

## 2017-05-04 LAB — DRAW EXTRA CLOT TUBE

## 2017-05-04 MED ORDER — PANCRELIPASE (LIP-PROT-AMYL) 36000-114000 UNITS PO CPEP: 72000.0000 [IU] | ORAL_CAPSULE | Freq: Three times a day (TID) | ORAL | 0 refills | Status: DC

## 2017-05-04 NOTE — Telephone Encounter (Signed)
Gave patient AVS and calendar of upcoming September appointments.  °

## 2017-05-04 NOTE — Progress Notes (Signed)
  [facility] OFFICE PROGRESS NOTE   Diagnosis: Rosana Hoes cancer  INTERVAL HISTORY:   Mr. Stecher returns for scheduled visit. He began radiation and concurrent Xeloda 04/13/2017. No mouth sores or hand/foot pain. He has noted hyperpigmentation. He has increased diarrhea since beginning treatment. The diarrhea is not relieved with over-the-counter medications. He was transfused packed red blood cells 04/08/2017. He felt better after the red cell transfusion. He denies symptoms of anemia today.  No bleeding.  Objective:  Vital signs in last 24 hours:  Blood pressure (!) 121/43, pulse 80, temperature 98.2 F (36.8 C), temperature source Oral, resp. rate 17, height 5\' 9"  (1.753 m), weight 193 lb (87.5 kg), SpO2 99 %.    HEENT: No thrush or ulcers Resp: Decreased breath sounds at the right compared to the left chest, no respiratory distress Cardio: Regular rate and rhythm GI: No hepatosplenomegaly, nontender  Skin: Mild hyperpigmentation of the palms   Lab Results:  Lab Results  Component Value Date   WBC 4.5 05/04/2017   HGB 7.5 (L) 05/04/2017   HCT 22.8 (L) 05/04/2017   MCV 75.8 (L) 05/04/2017   PLT 182 05/04/2017   NEUTROABS 3.8 05/04/2017     Imaging:  No results found.  Medications: I have reviewed the patient's current medications.  1. Pancreas cancer, pancreas body mass on CT 12/08/2016  CT 12/08/2016 consistent with a pancreas body mass and liver lesions suspicious for metastases  EUS on 12/15/2016 confirmed a pancreas body mass, T3 N0, biopsy consistent with adenocarcinoma  Abdominal ultrasound 02/26/2017-pancreas mass, no liver lesions apparent  PET scan 03/19/2017-enlargement of pancreas mass with vascular encasement and new involvement of the tail of the pancreas, mildly hypermetabolic chest and porta hepatis nodes. Possible hypermetabolic activity at [id number]-[id number] to represent a benign etiology  Initiation of concurrent radiation/Xeloda  04/13/2017  2. Acute cholecystitis February 2018, status post placement of a cholecystostomy tube  3. Diabetes  4. Congestive heart failure;hospitalized May 2018 with CHF  5. Peripheral vascular disease, status post bilateral BKA  6. Atrial fibrillation  7. Microcytic anemia-progressive, iron deficiency?, Bleeding related to Xarelto?  Red cell transfusion 04/07/2017  8. C. difficile colitis April 2018  9. Chronic renal failure  10. Anorectal hyperenhancing lesion noted on CT 12/08/2016   Disposition:  Mr. Fouts appears unchanged. He is tolerating the Xeloda and radiation well. He has persistent severe anemia. The anemia is most likely related to chronic GI blood loss while on Xarelto. There could also be bleeding related to tumor involving the GI tract.  He will increase the ferrous sulfate to 3 times daily. The CBC will be repeated next week and we will arrange for transfusion support as indicated.  Mr. Geathers will begin a trial of pancreatic enzyme replacement. He will let us know if this does not help the diarrhea.  He will return for an office visit in 2 weeks.  25 minutes were spent with the patient today. The majority of the time was used for counseling and coordination of care.  Donneta Romberg, MD  05/04/2017  2:25 PM

## 2017-05-05 ENCOUNTER — Encounter (HOSPITAL_COMMUNITY): Payer: Self-pay

## 2017-05-05 ENCOUNTER — Ambulatory Visit (HOSPITAL_BASED_OUTPATIENT_CLINIC_OR_DEPARTMENT_OTHER)
Admission: RE | Admit: 2017-05-05 | Discharge: 2017-05-05 | Disposition: A | Discharge status: Home / Self Care | Payer: Medicare Other | Source: Ambulatory Visit | Attending: Cardiology | Admitting: Cardiology

## 2017-05-05 ENCOUNTER — Ambulatory Visit
Admission: RE | Admit: 2017-05-05 | Discharge: 2017-05-05 | Disposition: A | Discharge status: Home / Self Care | Payer: Medicare Other | Source: Ambulatory Visit | Attending: Radiation Oncology | Admitting: Radiation Oncology

## 2017-05-05 VITALS — BP 116/74 | HR 90 | Wt 178.0 lb

## 2017-05-05 DIAGNOSIS — I429 Cardiomyopathy, unspecified: Secondary | ICD-10-CM

## 2017-05-05 DIAGNOSIS — I5022 Chronic systolic (congestive) heart failure: Secondary | ICD-10-CM | POA: Insufficient documentation

## 2017-05-05 DIAGNOSIS — Z79899 Other long term (current) drug therapy: Secondary | ICD-10-CM | POA: Insufficient documentation

## 2017-05-05 DIAGNOSIS — N183 Chronic kidney disease, stage 3 unspecified: Secondary | ICD-10-CM

## 2017-05-05 DIAGNOSIS — Z89511 Acquired absence of right leg below knee: Secondary | ICD-10-CM | POA: Insufficient documentation

## 2017-05-05 DIAGNOSIS — C259 Malignant neoplasm of pancreas, unspecified: Secondary | ICD-10-CM | POA: Diagnosis not present

## 2017-05-05 DIAGNOSIS — C251 Malignant neoplasm of body of pancreas: Secondary | ICD-10-CM | POA: Diagnosis not present

## 2017-05-05 DIAGNOSIS — Z89512 Acquired absence of left leg below knee: Secondary | ICD-10-CM | POA: Insufficient documentation

## 2017-05-05 DIAGNOSIS — I255 Ischemic cardiomyopathy: Secondary | ICD-10-CM

## 2017-05-05 DIAGNOSIS — Z7901 Long term (current) use of anticoagulants: Secondary | ICD-10-CM

## 2017-05-05 DIAGNOSIS — D649 Anemia, unspecified: Secondary | ICD-10-CM

## 2017-05-05 DIAGNOSIS — Z95810 Presence of automatic (implantable) cardiac defibrillator: Secondary | ICD-10-CM

## 2017-05-05 DIAGNOSIS — I5023 Acute on chronic systolic (congestive) heart failure: Secondary | ICD-10-CM | POA: Diagnosis not present

## 2017-05-05 DIAGNOSIS — I480 Paroxysmal atrial fibrillation: Secondary | ICD-10-CM

## 2017-05-05 LAB — BASIC METABOLIC PANEL
Anion gap: 6 (ref 5–15)
BUN: 15 mg/dL (ref 6–20)
CHLORIDE: 115 mmol/L — AB (ref 101–111)
CO2: 23 mmol/L (ref 22–32)
CREATININE: 1.31 mg/dL — AB (ref 0.61–1.24)
Calcium: 8 mg/dL — ABNORMAL LOW (ref 8.9–10.3)
GFR calc Af Amer: >60 mL/min (ref >60)
GFR calc non Af Amer: 52 mL/min — ABNORMAL LOW (ref >60)
Glucose, Bld: 89 mg/dL (ref 65–99)
Potassium: 3.5 mmol/L (ref 3.5–5.1)
Sodium: 144 mmol/L (ref 135–145)

## 2017-05-05 NOTE — Progress Notes (Signed)
PCP: Dr. Marcellus Scott HF Cardiology: Dr. Aundra Dubin  71 yo with history of CKD stage III, paroxysmal atrial fibrillation, chronic systolic CHF (nonischemic cardiomyopathy) and s/p bilateral BKAs presents for cardiology followup.  Patient has a long history of CHF.  He has a [employer] CRT-D device.  He had bilateral BKAs because of gangrene and walks on prostheses.  He was admitted in 2/18 with acute on chronic systolic CHF and also found to have acute cholecystitis.  Cholecystostomy tube was placed and he was treated with antibiotics. He was discharged to a SNF.    Returns today for HF follow up. Wife present at visit and says that he is more SOB and tired lately. The patient however denies SOB and says that he feels fine. His weight is trending down. Taking all medications. Continues to undergo treatment for pancreatic cancer. He called the office last week after a fall at the cancer center. He was reportedly SOB and had more swelling in his face. This has improved since he increased his lasix to 40 mg BID.   ECG (2/18, personally reviewed): NSR, BiV pacing  Labs (3/18): K 3.8, creatinine 1.5  PMH: 1. CKD: Stage III. 2. Atrial fibrillation: Paroxysmal.  3. Chronic systolic CHF: Nonischemic cardiomyopathy.  LHC in 2003 with nonobstructive disease.  [employer] CRT-D device.  - Echo (6/14) with EF 20-25%.  - Echo (2/18) with EF 20-25%, grade II diastolic dysfunction, mild-moderate MR, mildly decreased RV systolic function.  4. 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  5. h/o VT 6. Acute cholecystitis: 2/18.  Cholecystostomy tube placed.  7. Hip replacement 5/17.   Social History   Social History  . Marital status: Married    Spouse name: N/A  . Number of children: N/A  . Years of education: N/A   Occupational History  . Not on file.   Social History Main Topics  . Smoking status: Never Smoker  . Smokeless tobacco: Never Used  . Alcohol use No  . Drug use: No  .  Sexual activity: Not Currently   Other Topics Concern  . Not on file   Social History Narrative  . No narrative on file   Family History  Problem Relation Age of Onset  . Heart disease Mother   . Hypertension Mother   . Diabetes Mother   . Diabetes Father   . Ovarian cancer Sister 31       died in her 11's   ROS: All systems reviewed and negative except as per HPI.  Current Outpatient Prescriptions  Medication Sig Dispense Refill  . acetaminophen (TYLENOL) 325 MG tablet Take 2 tablets (650 mg total) by mouth every 6 (six) hours as needed for mild pain (or Fever >/= 101).    [name] alum & mag hydroxide-simeth (MAALOX/MYLANTA) 200-200-20 MG/5ML suspension Take 15 mLs by mouth every 4 (four) hours as needed for indigestion or heartburn. 355 mL 0  . atorvastatin (LIPITOR) 10 MG tablet Take 1 tablet (10 mg total) by mouth daily. 30 tablet 1  . capecitabine (XELODA) 500 MG tablet Take 3 tablets (1500mg ) by mouth in AM and 2 tabs (1000mg ) in PM, within 33min of meals, days of radiation only 140 tablet 0  . carvedilol (COREG) 6.25 MG tablet Take 1 tablet (6.25 mg total) by mouth 2 (two) times daily with a meal. 60 tablet 0  . cholecalciferol (VITAMIN D) 1000 units tablet Take 1,000 Units by mouth daily.    . furosemide (LASIX) 40 MG  tablet Take 1 tablet (40 mg total) by mouth 2 (two) times daily. 60 tablet 5  . Insulin Aspart (NOVOLOG [place]) Inject 4 Units into the skin 2 (two) times daily.    . insulin detemir (LEVEMIR) 100 UNIT/ML injection Inject 0.15 mLs (15 Units total) into the skin 2 (two) times daily. 10 mL 0  . iron polysaccharides (NIFEREX) 150 MG capsule Take 1 capsule (150 mg total) by mouth daily. 30 capsule 1  . lipase/protease/amylase (CREON) 36000 UNITS CPEP capsule Take 2 capsules (72,000 Units total) by mouth 3 (three) times daily before meals. Take 2 capsules (72,000 Units) before each meal. 180 capsule 0  . Multiple Vitamin (MULTIVITAMIN WITH MINERALS) TABS tablet Take 1 tablet by  mouth daily.    [name] omeprazole (PRILOSEC) 20 MG capsule Take 20 mg by mouth daily.    . potassium chloride SA (K-DUR,KLOR-CON) 20 MEQ tablet Take 1 tablet (20 mEq total) by mouth 2 (two) times daily. 60 tablet 0  . pregabalin (LYRICA) 75 MG capsule Take 1 capsule (75 mg total) by mouth 2 (two) times daily. 60 capsule 1  . protein supplement shake (PREMIER PROTEIN) LIQD Take 325 mLs (11 oz total) by mouth 2 (two) times daily between meals. 60 Can 0  . rivaroxaban (XARELTO) 20 MG TABS tablet Take 1 tablet (20 mg total) by mouth daily with supper. 30 tablet 0  . saccharomyces boulardii (FLORASTOR) 250 MG capsule Take 1 capsule (250 mg total) by mouth 2 (two) times daily. 14 capsule 0  . sacubitril-valsartan (ENTRESTO) 24-26 MG Take 1 tablet by mouth 2 (two) times daily. 60 tablet 6  . tamsulosin (FLOMAX) 0.4 MG CAPS capsule Take 1 capsule (0.4 mg total) by mouth daily after supper. 30 capsule 0   No current facility-administered medications for this encounter.    BP 116/74 (BP Location: Right Arm, Patient Position: Sitting, Cuff Size: Normal)   Pulse 90   Wt 178 lb (80.7 kg)   SpO2 100%   BMI 26.29 kg/m  General: Fatigued appearing. No resp difficulty. HEENT: Normal Neck: Supple. JVP 8-9 cm.  Carotids 2+ bilat; no bruits. No thyromegaly or nodule noted. Cor: PMI nondisplaced. RRR, No M/G/R noted Lungs: CTAB, normal effort. Abdomen: Soft, non-tender, non-distended, no HSM. No bruits or masses. +BS  Extremities: No cyanosis, clubbing, rash, s/p bilateral BKA.  Neuro: Alert & orientedx3, cranial nerves grossly intact. moves all 4 extremities w/o difficulty. Affect pleasant   Assessment/Plan: 1. Chronic systolic CHF: Echo (7/90) with EF 20-25%, mildly decreased RV systolic function. NICM, s/p [employer] CRT-D device.  - NYHA III - Volume stable on exam. Would continue lasix 40 mg BID. Check BMET today.  - Continue Entresto 24/26 mg BID.  - Continue Coreg 6.25 mg BID.  - At one time  he was on Arlyce Harman but it looks like it was discontinued in May 2018 at discharge from the hospital for AKI and hypotension. May be able to add back next visit.   2. CKD stage III: - BMET today  3. Atrial fibrillation: Paroxysmal.  - NSR on exam - Continue warfarin for anticoagulation.   4. Pancreatic cancer - Follows with Dr. Benay Spice - Currently undergoing chemo and radiation  5. Anemia - Hgb 7.5. Could be contributory to his fatigue and SOB. He will have this re checked next week at Oncology.   BMET today. Follow up in 2-3 months.     Arbutus Leas 05/05/2017

## 2017-05-05 NOTE — Patient Instructions (Signed)
Labs today We will only contact you if something comes back abnormal or we need to make some changes. Otherwise no news is good news!   Your physician recommends that you schedule a follow-up appointment in: 2-3 months   Do the following things EVERYDAY: 1) Weigh yourself in the morning before breakfast. Write it down and keep it in a log. 2) Take your medicines as prescribed 3) Eat low salt foods-Limit salt (sodium) to 2000 mg per day.  4) Stay as active as you can everyday 5) Limit all fluids for the day to less than 2 liters

## 2017-05-06 ENCOUNTER — Encounter (HOSPITAL_COMMUNITY): Payer: Self-pay | Admitting: Nurse Practitioner

## 2017-05-06 ENCOUNTER — Telehealth: Payer: Self-pay | Admitting: *Deleted

## 2017-05-06 ENCOUNTER — Telehealth: Payer: Self-pay | Admitting: Radiation Oncology

## 2017-05-06 ENCOUNTER — Ambulatory Visit: Payer: Medicare Other

## 2017-05-06 ENCOUNTER — Inpatient Hospital Stay (HOSPITAL_COMMUNITY): Payer: Medicare Other

## 2017-05-06 ENCOUNTER — Inpatient Hospital Stay (HOSPITAL_COMMUNITY)
Admission: EM | Admit: 2017-05-06 | Discharge: 2017-05-15 | DRG: 637 | Disposition: A | Discharge status: Hospice | Payer: Medicare Other | Attending: Internal Medicine | Admitting: Internal Medicine

## 2017-05-06 ENCOUNTER — Emergency Department (HOSPITAL_COMMUNITY): Payer: Medicare Other

## 2017-05-06 ENCOUNTER — Encounter: Payer: Self-pay | Admitting: *Deleted

## 2017-05-06 DIAGNOSIS — K219 Gastro-esophageal reflux disease without esophagitis: Secondary | ICD-10-CM | POA: Diagnosis present

## 2017-05-06 DIAGNOSIS — I480 Paroxysmal atrial fibrillation: Secondary | ICD-10-CM | POA: Diagnosis present

## 2017-05-06 DIAGNOSIS — Z79899 Other long term (current) drug therapy: Secondary | ICD-10-CM

## 2017-05-06 DIAGNOSIS — T451X5A Adverse effect of antineoplastic and immunosuppressive drugs, initial encounter: Secondary | ICD-10-CM | POA: Diagnosis present

## 2017-05-06 DIAGNOSIS — Z95810 Presence of automatic (implantable) cardiac defibrillator: Secondary | ICD-10-CM | POA: Diagnosis not present

## 2017-05-06 DIAGNOSIS — R109 Unspecified abdominal pain: Secondary | ICD-10-CM

## 2017-05-06 DIAGNOSIS — Z6827 Body mass index (BMI) 27.0-27.9, adult: Secondary | ICD-10-CM

## 2017-05-06 DIAGNOSIS — D509 Iron deficiency anemia, unspecified: Secondary | ICD-10-CM | POA: Diagnosis not present

## 2017-05-06 DIAGNOSIS — Z89511 Acquired absence of right leg below knee: Secondary | ICD-10-CM

## 2017-05-06 DIAGNOSIS — I5042 Chronic combined systolic (congestive) and diastolic (congestive) heart failure: Secondary | ICD-10-CM | POA: Diagnosis present

## 2017-05-06 DIAGNOSIS — C251 Malignant neoplasm of body of pancreas: Secondary | ICD-10-CM | POA: Diagnosis present

## 2017-05-06 DIAGNOSIS — N189 Chronic kidney disease, unspecified: Secondary | ICD-10-CM | POA: Diagnosis not present

## 2017-05-06 DIAGNOSIS — B9561 Methicillin susceptible Staphylococcus aureus infection as the cause of diseases classified elsewhere: Secondary | ICD-10-CM | POA: Diagnosis not present

## 2017-05-06 DIAGNOSIS — F0390 Unspecified dementia without behavioral disturbance: Secondary | ICD-10-CM | POA: Diagnosis present

## 2017-05-06 DIAGNOSIS — Z7901 Long term (current) use of anticoagulants: Secondary | ICD-10-CM | POA: Diagnosis not present

## 2017-05-06 DIAGNOSIS — N183 Chronic kidney disease, stage 3 unspecified: Secondary | ICD-10-CM | POA: Diagnosis present

## 2017-05-06 DIAGNOSIS — B961 Klebsiella pneumoniae [K. pneumoniae] as the cause of diseases classified elsewhere: Secondary | ICD-10-CM | POA: Diagnosis present

## 2017-05-06 DIAGNOSIS — E119 Type 2 diabetes mellitus without complications: Secondary | ICD-10-CM | POA: Diagnosis not present

## 2017-05-06 DIAGNOSIS — I2510 Atherosclerotic heart disease of native coronary artery without angina pectoris: Secondary | ICD-10-CM | POA: Diagnosis present

## 2017-05-06 DIAGNOSIS — Z515 Encounter for palliative care: Secondary | ICD-10-CM | POA: Diagnosis not present

## 2017-05-06 DIAGNOSIS — R140 Abdominal distension (gaseous): Secondary | ICD-10-CM

## 2017-05-06 DIAGNOSIS — K5900 Constipation, unspecified: Secondary | ICD-10-CM

## 2017-05-06 DIAGNOSIS — E663 Overweight: Secondary | ICD-10-CM | POA: Diagnosis present

## 2017-05-06 DIAGNOSIS — E7800 Pure hypercholesterolemia, unspecified: Secondary | ICD-10-CM | POA: Diagnosis present

## 2017-05-06 DIAGNOSIS — Z89512 Acquired absence of left leg below knee: Secondary | ICD-10-CM

## 2017-05-06 DIAGNOSIS — R55 Syncope and collapse: Secondary | ICD-10-CM | POA: Diagnosis not present

## 2017-05-06 DIAGNOSIS — K6289 Other specified diseases of anus and rectum: Secondary | ICD-10-CM | POA: Diagnosis not present

## 2017-05-06 DIAGNOSIS — E118 Type 2 diabetes mellitus with unspecified complications: Secondary | ICD-10-CM

## 2017-05-06 DIAGNOSIS — I428 Other cardiomyopathies: Secondary | ICD-10-CM | POA: Diagnosis present

## 2017-05-06 DIAGNOSIS — Z794 Long term (current) use of insulin: Secondary | ICD-10-CM | POA: Diagnosis not present

## 2017-05-06 DIAGNOSIS — I509 Heart failure, unspecified: Secondary | ICD-10-CM

## 2017-05-06 DIAGNOSIS — I10 Essential (primary) hypertension: Secondary | ICD-10-CM | POA: Diagnosis present

## 2017-05-06 DIAGNOSIS — W1812XA Fall from or off toilet with subsequent striking against object, initial encounter: Secondary | ICD-10-CM | POA: Diagnosis present

## 2017-05-06 DIAGNOSIS — E162 Hypoglycemia, unspecified: Secondary | ICD-10-CM | POA: Diagnosis present

## 2017-05-06 DIAGNOSIS — D500 Iron deficiency anemia secondary to blood loss (chronic): Secondary | ICD-10-CM | POA: Diagnosis not present

## 2017-05-06 DIAGNOSIS — K521 Toxic gastroenteritis and colitis: Secondary | ICD-10-CM | POA: Diagnosis present

## 2017-05-06 DIAGNOSIS — E11649 Type 2 diabetes mellitus with hypoglycemia without coma: Principal | ICD-10-CM | POA: Diagnosis present

## 2017-05-06 DIAGNOSIS — D61810 Antineoplastic chemotherapy induced pancytopenia: Secondary | ICD-10-CM | POA: Diagnosis present

## 2017-05-06 DIAGNOSIS — C259 Malignant neoplasm of pancreas, unspecified: Secondary | ICD-10-CM

## 2017-05-06 DIAGNOSIS — R197 Diarrhea, unspecified: Secondary | ICD-10-CM | POA: Diagnosis not present

## 2017-05-06 DIAGNOSIS — Z96642 Presence of left artificial hip joint: Secondary | ICD-10-CM | POA: Diagnosis present

## 2017-05-06 DIAGNOSIS — I4891 Unspecified atrial fibrillation: Secondary | ICD-10-CM | POA: Diagnosis not present

## 2017-05-06 DIAGNOSIS — Z8041 Family history of malignant neoplasm of ovary: Secondary | ICD-10-CM

## 2017-05-06 DIAGNOSIS — E876 Hypokalemia: Secondary | ICD-10-CM | POA: Diagnosis present

## 2017-05-06 DIAGNOSIS — Z89519 Acquired absence of unspecified leg below knee: Secondary | ICD-10-CM

## 2017-05-06 DIAGNOSIS — E1122 Type 2 diabetes mellitus with diabetic chronic kidney disease: Secondary | ICD-10-CM | POA: Diagnosis present

## 2017-05-06 DIAGNOSIS — G9341 Metabolic encephalopathy: Secondary | ICD-10-CM | POA: Diagnosis not present

## 2017-05-06 DIAGNOSIS — E1151 Type 2 diabetes mellitus with diabetic peripheral angiopathy without gangrene: Secondary | ICD-10-CM | POA: Diagnosis present

## 2017-05-06 DIAGNOSIS — I130 Hypertensive heart and chronic kidney disease with heart failure and stage 1 through stage 4 chronic kidney disease, or unspecified chronic kidney disease: Secondary | ICD-10-CM | POA: Diagnosis present

## 2017-05-06 DIAGNOSIS — S0081XA Abrasion of other part of head, initial encounter: Secondary | ICD-10-CM | POA: Diagnosis present

## 2017-05-06 DIAGNOSIS — I5043 Acute on chronic combined systolic (congestive) and diastolic (congestive) heart failure: Secondary | ICD-10-CM | POA: Diagnosis present

## 2017-05-06 DIAGNOSIS — Z87442 Personal history of urinary calculi: Secondary | ICD-10-CM

## 2017-05-06 DIAGNOSIS — N390 Urinary tract infection, site not specified: Secondary | ICD-10-CM | POA: Diagnosis present

## 2017-05-06 DIAGNOSIS — K922 Gastrointestinal hemorrhage, unspecified: Secondary | ICD-10-CM | POA: Diagnosis not present

## 2017-05-06 DIAGNOSIS — R110 Nausea: Secondary | ICD-10-CM | POA: Diagnosis not present

## 2017-05-06 DIAGNOSIS — D630 Anemia in neoplastic disease: Secondary | ICD-10-CM | POA: Diagnosis present

## 2017-05-06 DIAGNOSIS — I340 Nonrheumatic mitral (valve) insufficiency: Secondary | ICD-10-CM | POA: Diagnosis present

## 2017-05-06 DIAGNOSIS — R112 Nausea with vomiting, unspecified: Secondary | ICD-10-CM | POA: Diagnosis not present

## 2017-05-06 DIAGNOSIS — C787 Secondary malignant neoplasm of liver and intrahepatic bile duct: Secondary | ICD-10-CM | POA: Diagnosis present

## 2017-05-06 DIAGNOSIS — Y92002 Bathroom of unspecified non-institutional (private) residence single-family (private) house as the place of occurrence of the external cause: Secondary | ICD-10-CM | POA: Diagnosis not present

## 2017-05-06 DIAGNOSIS — E1142 Type 2 diabetes mellitus with diabetic polyneuropathy: Secondary | ICD-10-CM | POA: Diagnosis present

## 2017-05-06 DIAGNOSIS — R195 Other fecal abnormalities: Secondary | ICD-10-CM | POA: Diagnosis present

## 2017-05-06 DIAGNOSIS — Z833 Family history of diabetes mellitus: Secondary | ICD-10-CM

## 2017-05-06 DIAGNOSIS — Z8249 Family history of ischemic heart disease and other diseases of the circulatory system: Secondary | ICD-10-CM

## 2017-05-06 DIAGNOSIS — D6481 Anemia due to antineoplastic chemotherapy: Secondary | ICD-10-CM | POA: Diagnosis present

## 2017-05-06 LAB — LIPASE, BLOOD: Lipase: 13 U/L (ref 11–51)

## 2017-05-06 LAB — URINALYSIS, ROUTINE W REFLEX MICROSCOPIC
Bilirubin Urine: NEGATIVE
Glucose, UA: NEGATIVE mg/dL
Ketones, ur: NEGATIVE mg/dL
Nitrite: NEGATIVE
Protein, ur: 100 mg/dL — AB
SPECIFIC GRAVITY, URINE: 1.011 (ref 1.005–1.030)
pH: 5 (ref 5.0–8.0)

## 2017-05-06 LAB — CBC WITH DIFFERENTIAL/PLATELET
BASOS PCT: 0 %
Basophils Absolute: 0 10*3/uL (ref 0.0–0.1)
EOS ABS: 0.1 10*3/uL (ref 0.0–0.7)
Eosinophils Relative: 3 %
HCT: 22 % — ABNORMAL LOW (ref 39.0–52.0)
HEMOGLOBIN: 7.4 g/dL — AB (ref 13.0–17.0)
LYMPHS PCT: 9 %
Lymphs Abs: 0.3 10*3/uL — ABNORMAL LOW (ref 0.7–4.0)
MCH: 24.7 pg — AB (ref 26.0–34.0)
MCHC: 33.6 g/dL (ref 30.0–36.0)
MCV: 73.3 fL — AB (ref 78.0–100.0)
Monocytes Absolute: 0.1 10*3/uL (ref 0.1–1.0)
Monocytes Relative: 5 %
NEUTROS ABS: 2.3 10*3/uL (ref 1.7–7.7)
Neutrophils Relative %: 83 %
Platelets: 172 10*3/uL (ref 150–400)
RBC: 3 MIL/uL — ABNORMAL LOW (ref 4.22–5.81)
RDW: 24.2 % — ABNORMAL HIGH (ref 11.5–15.5)
WBC: 2.8 10*3/uL — ABNORMAL LOW (ref 4.0–10.5)

## 2017-05-06 LAB — COMPREHENSIVE METABOLIC PANEL
ALK PHOS: 41 U/L (ref 38–126)
ALT: 10 U/L — AB (ref 17–63)
ANION GAP: 9 (ref 5–15)
AST: 18 U/L (ref 15–41)
Albumin: 2.7 g/dL — ABNORMAL LOW (ref 3.5–5.0)
BUN: 19 mg/dL (ref 6–20)
CALCIUM: 7.9 mg/dL — AB (ref 8.9–10.3)
CO2: 23 mmol/L (ref 22–32)
CREATININE: 1.24 mg/dL (ref 0.61–1.24)
Chloride: 111 mmol/L (ref 101–111)
GFR calc Af Amer: >60 mL/min (ref >60)
GFR calc non Af Amer: 56 mL/min — ABNORMAL LOW (ref >60)
GLUCOSE: 48 mg/dL — AB (ref 65–99)
Potassium: 3.8 mmol/L (ref 3.5–5.1)
SODIUM: 143 mmol/L (ref 135–145)
Total Bilirubin: 0.5 mg/dL (ref 0.3–1.2)
Total Protein: 6.3 g/dL — ABNORMAL LOW (ref 6.5–8.1)

## 2017-05-06 LAB — PROTIME-INR
INR: 2.28
PROTHROMBIN TIME: 24.9 s — AB (ref 11.4–15.2)

## 2017-05-06 LAB — CBG MONITORING, ED
GLUCOSE-CAPILLARY: 44 mg/dL — AB (ref 65–99)
Glucose-Capillary: 116 mg/dL — ABNORMAL HIGH (ref 65–99)
Glucose-Capillary: 86 mg/dL (ref 65–99)
Glucose-Capillary: 80 mg/dL (ref 65–99)
Glucose-Capillary: 75 mg/dL (ref 65–99)

## 2017-05-06 LAB — I-STAT TROPONIN, ED: Troponin i, poc: 0 ng/mL (ref 0.00–0.08)

## 2017-05-06 LAB — GLUCOSE, CAPILLARY
Glucose-Capillary: 72 mg/dL (ref 65–99)
Glucose-Capillary: 73 mg/dL (ref 65–99)
Glucose-Capillary: 108 mg/dL — ABNORMAL HIGH (ref 65–99)
Glucose-Capillary: 118 mg/dL — ABNORMAL HIGH (ref 65–99)

## 2017-05-06 LAB — HEMOGLOBIN A1C
Hgb A1c MFr Bld: 8 % — ABNORMAL HIGH (ref 4.8–5.6)
MEAN PLASMA GLUCOSE: 182.9 mg/dL

## 2017-05-06 LAB — MRSA PCR SCREENING: MRSA BY PCR: POSITIVE — AB

## 2017-05-06 LAB — I-STAT CG4 LACTIC ACID, ED: Lactic Acid, Venous: 1.11 mmol/L (ref 0.5–1.9)

## 2017-05-06 LAB — BRAIN NATRIURETIC PEPTIDE: B Natriuretic Peptide: 552.8 pg/mL — ABNORMAL HIGH (ref 0.0–100.0)

## 2017-05-06 MED ORDER — ACETAMINOPHEN 650 MG RE SUPP
650.0000 mg | Freq: Four times a day (QID) | RECTAL | Status: DC | PRN
  Administered 2017-05-15: 650 mg via RECTAL
  Filled 2017-05-06: qty 1

## 2017-05-06 MED ORDER — DEXTROSE 5 % IV SOLN
Freq: Once | INTRAVENOUS | Status: AC
  Administered 2017-05-06: 13:00:00 via INTRAVENOUS

## 2017-05-06 MED ORDER — INSULIN ASPART 100 UNIT/ML ~~LOC~~ SOLN
0.0000 [IU] | Freq: Every day | SUBCUTANEOUS | Status: DC
  Administered 2017-05-08: 3 [IU] via SUBCUTANEOUS
  Administered 2017-05-09: 2 [IU] via SUBCUTANEOUS
  Administered 2017-05-10: 4 [IU] via SUBCUTANEOUS
  Administered 2017-05-12 – 2017-05-14 (×3): 2 [IU] via SUBCUTANEOUS

## 2017-05-06 MED ORDER — DEXTROSE-NACL 5-0.9 % IV SOLN
INTRAVENOUS | Status: DC
  Administered 2017-05-06 – 2017-05-07 (×2): via INTRAVENOUS

## 2017-05-06 MED ORDER — FUROSEMIDE 10 MG/ML IJ SOLN
20.0000 mg | Freq: Two times a day (BID) | INTRAMUSCULAR | Status: DC
  Administered 2017-05-06: 20 mg via INTRAVENOUS
  Filled 2017-05-06: qty 2

## 2017-05-06 MED ORDER — SACCHAROMYCES BOULARDII 250 MG PO CAPS
250.0000 mg | ORAL_CAPSULE | Freq: Two times a day (BID) | ORAL | Status: DC
  Administered 2017-05-06 – 2017-05-11 (×10): 250 mg via ORAL
  Filled 2017-05-06 (×10): qty 1

## 2017-05-06 MED ORDER — DEXTROSE 50 % IV SOLN
1.0000 | Freq: Once | INTRAVENOUS | Status: AC
  Administered 2017-05-06: 50 mL via INTRAVENOUS
  Filled 2017-05-06: qty 50

## 2017-05-06 MED ORDER — FUROSEMIDE 40 MG PO TABS: 40.0000 mg | ORAL_TABLET | Freq: Two times a day (BID) | ORAL | Status: DC

## 2017-05-06 MED ORDER — PREGABALIN 75 MG PO CAPS
75.0000 mg | ORAL_CAPSULE | Freq: Two times a day (BID) | ORAL | Status: DC
  Administered 2017-05-06 – 2017-05-13 (×14): 75 mg via ORAL
  Filled 2017-05-06 (×17): qty 1

## 2017-05-06 MED ORDER — FUROSEMIDE 10 MG/ML IJ SOLN
40.0000 mg | INTRAMUSCULAR | Status: AC
  Administered 2017-05-06: 40 mg via INTRAVENOUS
  Filled 2017-05-06: qty 4

## 2017-05-06 MED ORDER — INSULIN ASPART 100 UNIT/ML ~~LOC~~ SOLN
0.0000 [IU] | Freq: Three times a day (TID) | SUBCUTANEOUS | Status: DC
  Administered 2017-05-07 (×2): 2 [IU] via SUBCUTANEOUS
  Administered 2017-05-08 – 2017-05-09 (×5): 3 [IU] via SUBCUTANEOUS
  Administered 2017-05-09: 2 [IU] via SUBCUTANEOUS
  Administered 2017-05-10: 3 [IU] via SUBCUTANEOUS
  Administered 2017-05-10 (×2): 5 [IU] via SUBCUTANEOUS
  Administered 2017-05-11: 2 [IU] via SUBCUTANEOUS
  Administered 2017-05-11 – 2017-05-12 (×5): 3 [IU] via SUBCUTANEOUS
  Administered 2017-05-13: 5 [IU] via SUBCUTANEOUS
  Administered 2017-05-13 (×2): 3 [IU] via SUBCUTANEOUS
  Administered 2017-05-14: 5 [IU] via SUBCUTANEOUS
  Administered 2017-05-14 (×2): 3 [IU] via SUBCUTANEOUS

## 2017-05-06 MED ORDER — PANTOPRAZOLE SODIUM 40 MG PO TBEC
40.0000 mg | DELAYED_RELEASE_TABLET | Freq: Every day | ORAL | Status: DC
  Administered 2017-05-06 – 2017-05-10 (×5): 40 mg via ORAL
  Filled 2017-05-06 (×5): qty 1

## 2017-05-06 MED ORDER — POLYETHYLENE GLYCOL 3350 17 G PO PACK: 17.0000 g | PACK | Freq: Every day | ORAL | Status: DC | PRN

## 2017-05-06 MED ORDER — ONDANSETRON HCL 4 MG PO TABS: 4.0000 mg | ORAL_TABLET | Freq: Four times a day (QID) | ORAL | Status: DC | PRN

## 2017-05-06 MED ORDER — TAMSULOSIN HCL 0.4 MG PO CAPS
0.4000 mg | ORAL_CAPSULE | Freq: Every day | ORAL | Status: DC
  Administered 2017-05-06 – 2017-05-12 (×7): 0.4 mg via ORAL
  Filled 2017-05-06 (×8): qty 1

## 2017-05-06 MED ORDER — FUROSEMIDE 10 MG/ML IJ SOLN
40.0000 mg | Freq: Two times a day (BID) | INTRAMUSCULAR | Status: DC
  Administered 2017-05-07: 40 mg via INTRAVENOUS
  Filled 2017-05-06: qty 4

## 2017-05-06 MED ORDER — ADULT MULTIVITAMIN W/MINERALS CH
1.0000 | ORAL_TABLET | Freq: Every day | ORAL | Status: DC
  Administered 2017-05-07 – 2017-05-12 (×6): 1 via ORAL
  Filled 2017-05-06 (×6): qty 1

## 2017-05-06 MED ORDER — SACUBITRIL-VALSARTAN 24-26 MG PO TABS
1.0000 | ORAL_TABLET | Freq: Two times a day (BID) | ORAL | Status: DC
  Administered 2017-05-06 – 2017-05-07 (×2): 1 via ORAL
  Filled 2017-05-06 (×2): qty 1

## 2017-05-06 MED ORDER — CEFTRIAXONE SODIUM 1 G IJ SOLR
1.0000 g | INTRAMUSCULAR | Status: DC
  Administered 2017-05-07 – 2017-05-09 (×3): 1 g via INTRAVENOUS
  Filled 2017-05-06 (×4): qty 10

## 2017-05-06 MED ORDER — ONDANSETRON HCL 4 MG/2ML IJ SOLN
4.0000 mg | Freq: Four times a day (QID) | INTRAMUSCULAR | Status: DC | PRN
  Administered 2017-05-11 – 2017-05-15 (×7): 4 mg via INTRAVENOUS
  Filled 2017-05-06 (×8): qty 2

## 2017-05-06 MED ORDER — DEXTROSE 5 % IV SOLN
1.0000 g | Freq: Once | INTRAVENOUS | Status: AC
  Administered 2017-05-06: 1 g via INTRAVENOUS
  Filled 2017-05-06: qty 10

## 2017-05-06 MED ORDER — RIVAROXABAN 20 MG PO TABS
20.0000 mg | ORAL_TABLET | Freq: Every day | ORAL | Status: DC
  Administered 2017-05-06: 20 mg via ORAL
  Filled 2017-05-06: qty 1

## 2017-05-06 MED ORDER — ACETAMINOPHEN 325 MG PO TABS
650.0000 mg | ORAL_TABLET | Freq: Four times a day (QID) | ORAL | Status: DC | PRN
  Administered 2017-05-09 – 2017-05-10 (×3): 650 mg via ORAL
  Filled 2017-05-06 (×3): qty 2

## 2017-05-06 MED ORDER — CARVEDILOL 6.25 MG PO TABS
6.2500 mg | ORAL_TABLET | Freq: Two times a day (BID) | ORAL | Status: DC
  Administered 2017-05-06 – 2017-05-14 (×14): 6.25 mg via ORAL
  Filled 2017-05-06 (×16): qty 1

## 2017-05-06 MED ORDER — POLYSACCHARIDE IRON COMPLEX 150 MG PO CAPS
150.0000 mg | ORAL_CAPSULE | Freq: Every day | ORAL | Status: DC
  Administered 2017-05-07 – 2017-05-12 (×6): 150 mg via ORAL
  Filled 2017-05-06 (×6): qty 1

## 2017-05-06 NOTE — ED Notes (Signed)
Patients wife report that he has not eaten breakfast.

## 2017-05-06 NOTE — Telephone Encounter (Signed)
LM for pt and or wife to call back regarding need to cancel today's treatment due to nausea/vomiting. He needs prescriptions for antiemetics and nursing will call in for pt. Explained in my message that we would want to continue treatment if medically stable, and he needs to be assessed.

## 2017-05-06 NOTE — ED Notes (Signed)
[name] [name] [phone] accepting RN. Report scheduled 1450

## 2017-05-06 NOTE — ED Notes (Signed)
Patient denies pain and is resting comfortably.  

## 2017-05-06 NOTE — ED Notes (Signed)
Bed: NG39 Expected date:  Expected time:  Means of arrival:  Comments: EMS/hypoglycemia/Cancer

## 2017-05-06 NOTE — ED Triage Notes (Signed)
Pt brought in via ems from home. Per ems wife stated over the past 24hrs his blood sugar keeps going up and down. Per wife he dropped to  21 last night and was given D50. Today his was cbg 15. Per ems reading at their arrival was 7. He was given per ems 15gm oral glucose @10am  given he was A&O. CBG was 69 per ems on arrival.  LAC#18. A&O x4, denies any pain. Neuro intact.  He is currently being treated for pancreatic cancer. States he has some nausea and vomiting due to this but otherwise no other complaints. Denies pain. Bilateral BKA.  NSR, 108/68, 87, 97% RA. Wife is at bedside.

## 2017-05-06 NOTE — Progress Notes (Signed)
Pharmacy Antibiotic Note  [name] [name] is a 71 y.o. male presented to the ED on 05/06/2017 with hypoglcemia.  UA showed many bacteria, large leuk.  To start ceftriaxone for suspected UTI.  Plan: - ceftriaxone 1gm IV q24h - no renal adjustment [place] needed with ceftriaxone -- pharmacy will sign off.  Re-consult [place] if need further assistance. __________________________________  Height: 5\' 9"  (175.3 cm) Weight: 188 lb 15 oz (85.7 kg) IBW/kg (Calculated) : 70.7  Temp (24hrs), Avg:99.8 F (37.7 C), Min:99.1 F (37.3 C), Max:100.4 F (38 C)   Recent Labs Lab 05/04/17 1108 05/05/17 1022 05/06/17 1127 05/06/17 1159 05/06/17 1343  WBC 4.5  --   --   --  2.8*  CREATININE  --  1.31* 1.24  --   --   LATICACIDVEN  --   --   --  1.11  --     Estimated Creatinine Clearance: 57.6 mL/min (by C-G formula based on SCr of 1.24 mg/dL).    No Known Allergies  Thank you for allowing pharmacy to be a part of this patient's care.  Lynelle Doctor 05/06/2017 4:05 PM

## 2017-05-06 NOTE — ED Provider Notes (Addendum)
[facility] DEPT Provider Note   CSN: [id number] Arrival date & time: 05/06/17  1030     History   Chief Complaint Chief Complaint  Patient presents with  . Hypoglycemia    HPI [name] [name] is a 71 y.o. male.  71 year old male with extensive past medical history including atrial fibrillation, CK D, CAD, cardiomyopathy, CHF,pancreatic cancer who presents with hyperglycemia. Wife notes that over the past one week he has had labile blood sugars with some severe drops in his blood glucose. This is been worse over the past 24 hours. She states that he administers his own insulin but she helps him with the needles. No recent changes to his medications. Today his blood glucose was 49, EMS got reading of 55 and gave oral glucose prior to arrival to the ED. He occasionally has nausea and vomiting related to his cancer and did have some vomiting overnight. Currently he denies any complaints. Specifically he denies any abdominal pain, nausea, or recent diarrhea. No fevers or breathing problems. Wife does note that his abdomen seems more distended than usual. Of note, patient had syncopal episode while on toilet overnight and scraped his forehead.  LEVEL 5 CAVEAT DUE TO DEMENTIA   The history is provided by the spouse and the patient.    Past Medical History:  Diagnosis Date  . Anemia    takes Iron pill daily  . Arthritis   . Automatic implantable cardioverter-defibrillator in situ    a. 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  . CAD (coronary artery disease)   . Cardiomyopathy, nonischemic ([place])    a. Cath 2003: mild nonobstructive CAD, EF 25% at that time.  . Chronic systolic CHF (congestive heart failure) (HCC)    takes Furosemide daily as well as Aldactone  . CKD (chronic kidney disease), stage III   . Complication of anesthesia   . Constipation    takes Sennokot daily  . Dementia   . Family history of ovarian cancer   . Gangrenous toe, Lt  toe 09/21/2013  . 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 Checked in July 2017 with [employer] Panel  . GERD (gastroesophageal reflux disease)    takes Nexium daily  . Headache    occasionally  . High cholesterol    takes Atorvastatin daily  . History of blood transfusion    no abnormal reaction noted  . History of bronchitis    > 1 yr ago  . History of kidney stones   . Hypertension    takes Coreg daily  . LBBB (left bundle branch block)    takes Coumadin daily  . NSVT (nonsustained ventricular tachycardia) ([place])    a. Noted on ICD interrogation in 2011.  [name] PAD (peripheral artery disease) ([place])    a. 08/2013 Periph Angio/PTA: Abd Ao nl, RLE- 3v runoff, PT diff dzs, AT 90p, LLE 2v runoff, PT 100, AT 39m(diamondback ORA/chocolate balloon PTA).  .Marland KitchenPAF (paroxysmal atrial fibrillation) ([place]    a. Noted on ICD interrogation 2012;  b. coumadin d/c'd 01/2013.  .Marland KitchenPAF (paroxysmal atrial fibrillation) ([place]   . Pancreatic cancer ([place]   . Peripheral neuropathy    hands;numbness and tingling   . Pneumonia    hx of > 89yrgo  . PONV (postoperative nausea and vomiting)   . Presence of permanent cardiac pacemaker   . Type II diabetes mellitus (HCC)    takes Levemir  daily as well as Novolog  . Urinary frequency   . Urinary urgency     Patient Active Problem List   Diagnosis Date Noted  . Genetic testing 03/30/2017  . Family history of ovarian cancer   . Acute on chronic systolic CHF (congestive heart failure) ([place]) 01/03/2017  . Carcinoma of body of pancreas ([place]) 12/23/2016  . Chronic anticoagulation (warfarin) 12/09/2016  . Pancreatic mass 12/08/2016  . Abdominal pain   . Cholecystitis s/p perc cholecystostomy tube 10/18/2016   . Stage 3 chronic kidney disease   . Hypernatremia   . Systolic CHF, acute on chronic ([place]) 10/13/2016  . Diarrhea 10/13/2016  . Acute on chronic systolic congestive heart  failure ([place])   . HCAP (healthcare-associated pneumonia) 04/30/2016  . Weakness generalized 04/30/2016  . Status post hip hemiarthroplasty   . Urinary retention   . Dementia   . AKI (acute kidney injury) ([place])   . Thrombocytopenia ([place])   . Anemia of chronic disease   . Closed left hip fracture ([place]) 12/28/2015  . Acute renal failure superimposed on stage 3 chronic kidney disease ([place]) 12/28/2015  . Fall   . Diabetes mellitus with neuropathy ([place]) 01/10/2015  . Atrial fibrillation [I48.91] 11/01/2014  . Encounter for therapeutic drug monitoring 11/01/2014  . S/P bilateral BKA (below knee amputation) ([place]) 02/02/2014  . Osteomyelitis of right foot ([place]) 01/30/2014  . 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 ([place]) 01/04/2014  . Osteomyelitis of toe of right foot ([place]) 01/04/2014  . S/P Lt BKA 11/09/13 11/14/2013  . Acute blood loss anemia 11/11/2013  . Type 2 diabetes mellitus with complication, with long-term current use of insulin ([place]) 11/09/2013  . Lower limb amputation, great toe ([place]) 10/11/2013  . Lower limb amputation, other toe(s) 10/11/2013  . Chronic osteomyelitis of toe of left foot ([place]) 10/04/2013  . Foot osteomyelitis, left ([place]) 10/04/2013  . Uncontrolled pain, Lt toe 09/21/2013  . PAD (peripheral artery disease) ([place]) 09/13/2013  . Hyperlipidemia 09/13/2013  . PAF (paroxysmal atrial fibrillation) ([place]) 09/13/2013  . Critical lower limb ischemia- s/p Rt anterior tibial PTA 12/29/13 in preparation for Rt BKA 08/30/2013  . Generalized weakness 02/12/2013  . 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 HTN (hypertension) 04/08/2011  . NICM- EF 20-25% echo 6/14 09/29/2008  . LBBB 09/29/2008  . SYSTOLIC HEART FAILURE, CHRONIC 09/29/2008    Past Surgical History:  Procedure Laterality Date  . ABDOMINAL ANGIOGRAM  09/12/2013   Procedure: ABDOMINAL ANGIOGRAM;  Surgeon: Lorretta Harp, MD;  Location: [facility] CATH LAB;  Service: Cardiovascular;;  . AMPUTATION Left  10/04/2013   Procedure: LEFT GREAT TOE AND SECOND TOE AMPUTATION;  Surgeon: Marianna Payment, MD;  Location: [facility];  Service: Orthopedics;  Laterality: Left;  . AMPUTATION Left 11/09/2013   Procedure: LEFT AMPUTATION BELOW KNEE;  Surgeon: Marianna Payment, MD;  Location: [facility];  Service: Orthopedics;  Laterality: Left;  . AMPUTATION Right 01/04/2014   Procedure: Doristine Devoid second and third toe amputation;  Surgeon: Marianna Payment, MD;  Location: [facility];  Service: Orthopedics;  Laterality: Right;  . AMPUTATION Right 01/30/2014   Procedure: RIGHT BELOW KNEE AMPUTATION;  Surgeon: Marianna Payment, MD;  Location: [facility];  Service: Orthopedics;  Laterality: Right;  . ATHERECTOMY Right 12/29/2013   Procedure: ATHERECTOMY;  Surgeon: Lorretta Harp, MD;  Location: [facility] CATH LAB;  Service: Cardiovascular;  Laterality: Right;  Anterior Tibial Artery  . CARDIAC CATHETERIZATION  10/01/2001  THERE WAS GLOBAL HYPOKINESIS AND EF 25%. THERE APPEARED TO BE GLOBAL DECREASE IN WALL MOTION  . CARDIAC DEFIBRILLATOR PLACEMENT  06/2009   WITH GENERATOR REPLACED; BiV ICD  . CARDIOVASCULAR STRESS TEST  03/20/2009   EF 33%  . [place]  . COLONOSCOPY    . ESOPHAGOGASTRODUODENOSCOPY    . EUS Left 12/15/2016   Procedure: UPPER ENDOSCOPIC ULTRASOUND (EUS) LINEAR;  Surgeon: Arta Silence, MD;  Location: [facility] ENDOSCOPY;  Service: Endoscopy;  Laterality: Left;  . HIP ARTHROPLASTY Left 12/28/2015   Procedure: POSTERIOR  APPROACH HEMI HIP ARTHROPLASTY;  Surgeon: Leandrew Koyanagi, MD;  Location: [facility];  Service: Orthopedics;  Laterality: Left;  . IR CATHETER TUBE CHANGE  12/08/2016  . IR GENERIC HISTORICAL  10/18/2016   IR PERC CHOLECYSTOSTOMY 10/18/2016 [name], MD [facility]-INTERV RAD  . IR RADIOLOGIST EVAL & MGMT  11/20/2016  . IR RADIOLOGIST EVAL & MGMT  12/31/2016  . IR SINUS/FIST TUBE CHK-NON GI  01/14/2017  . LEG AMPUTATION BELOW KNEE Left 11/09/2013   DR Erlinda Hong  . LITHOTRIPSY  2001  . LOWER EXTREMITY ANGIOGRAM  Bilateral 09/12/2013   Procedure: LOWER EXTREMITY ANGIOGRAM;  Surgeon: Lorretta Harp, MD;  Location[facility] C[facility] LAB;  Service: Cardiovascular;  Laterality: Bilateral;  . LOWER EXTREMITY ANGIOGRAM N/A 12/29/2013   Procedure: LOWER EXTREMITY ANGIOGRAM;  Surgeon: Lorretta Harp, MD;  Location: [facility] C[facility] LAB;  Service: Cardiovascular;  Laterality: N/A;  . STUMP REVISION Left 01/03/2015   Procedure: LEFT BELOW KNEE AMPUTATION REVISION;  Surgeon: Leandrew Koyanagi, MD;  Location: [facility];  Service: Orthopedics;  Laterality: Left;  . TOE AMPUTATION  10/04/2013   LEFT GREAT TOE AND 4TH TOE   /   DR Erlinda Hong  . TRANSLUMINAL ATHERECTOMY TIBIAL ARTERY Left 09/12/2013  . US ECHOCARDIOGRAPHY  03/21/2008   EF 30-35%       Home Medications    Prior to Admission medications   Medication Sig Start Date End Date Taking? Authorizing Provider  acetaminophen (TYLENOL) 325 MG tablet Take 2 tablets (650 mg total) by mouth every 6 (six) hours as needed for mild pain (or Fever >/= 101). 05/03/16  Yes Mikhail, Weinert, DO  alum & mag hydroxide-simeth (MAALOX/MYLANTA) 200-200-20 MG/5ML suspension Take 15 mLs by mouth every 4 (four) hours as needed for indigestion or heartburn. 12/16/16  Yes Mikhail, Velta Addison, DO  atorvastatin (LIPITOR) 10 MG tablet Take 1 tablet (10 mg total) by mouth daily. 01/15/15  Yes Angiulli, Lavon Paganini, PA-C  capecitabine (XELODA) 500 MG tablet Take 3 tablets ('1500mg'$ ) by mouth in AM and 2 tabs ('1000mg'$ ) in PM, within 67mn of meals, days of radiation only 04/07/17  Yes SLadell Pier MD  carvedilol (COREG) 6.25 MG tablet Take 1 tablet (6.25 mg total) by mouth 2 (two) times daily with a meal. 10/23/16  Yes VVelvet Bathe MD  cholecalciferol (VITAMIN D) 1000 units tablet Take 1,000 Units by mouth daily.   Yes [provider]  furosemide (LASIX) 40 MG tablet Take 1 tablet (40 mg total) by mouth 2 (two) times daily. 04/23/17  Yes MLarey Dresser MD  Insulin Aspart (NOVOLOG [place]) Inject 4 Units into the skin 2 (two)  times daily.   Yes [provider]  insulin detemir (LEVEMIR) 100 UNIT/ML injection Inject 0.15 mLs (15 Units total) into the skin 2 (two) times daily. Patient taking differently: Inject 25 Units into the skin 2 (two) times daily.  10/23/16  Yes VVelvet Bathe MD  iron polysaccharides (NIFEREX)  150 MG capsule Take 1 capsule (150 mg total) by mouth daily. 01/15/15  Yes Angiulli, Mcarthur Rossetti, PA-C  Multiple Vitamin (MULTIVITAMIN WITH MINERALS) TABS tablet Take 1 tablet by mouth daily.   Yes [provider]  omeprazole (PRILOSEC) 20 MG capsule Take 20 mg by mouth daily.   Yes [provider]  potassium chloride SA (K-DUR,KLOR-CON) 20 MEQ tablet Take 1 tablet (20 mEq total) by mouth 2 (two) times daily. 01/08/17  Yes Randel Pigg, Dorma Russell, MD  pregabalin (LYRICA) 75 MG capsule Take 1 capsule (75 mg total) by mouth 2 (two) times daily. 01/15/15  Yes Angiulli, Mcarthur Rossetti, PA-C  rivaroxaban (XARELTO) 20 MG TABS tablet Take 1 tablet (20 mg total) by mouth daily with supper. 01/08/17  Yes Randel Pigg, Dorma Russell, MD  saccharomyces boulardii (FLORASTOR) 250 MG capsule Take 1 capsule (250 mg total) by mouth 2 (two) times daily. 12/16/16  Yes Mikhail, Maryann, DO  sacubitril-valsartan (ENTRESTO) 24-26 MG Take 1 tablet by mouth 2 (two) times daily. 10/31/15  Yes Duke Salvia, MD  tamsulosin (FLOMAX) 0.4 MG CAPS capsule Take 1 capsule (0.4 mg total) by mouth daily after supper. 01/08/17  Yes Randel Pigg, Dorma Russell, MD  lipase/protease/amylase (CREON) 36000 UNITS CPEP capsule Take 2 capsules (72,000 Units total) by mouth 3 (three) times daily before meals. Take 2 capsules (72,000 Units) before each meal. Patient not taking: Reported on 05/06/2017 05/04/17 06/03/17  Ladene Artist, MD  ondansetron (ZOFRAN) 8 MG tablet Take 8 mg by mouth every 8 (eight) hours as needed for nausea. 05/06/17   [provider]  protein supplement shake (PREMIER PROTEIN) LIQD Take 325 mLs (11 oz total) by mouth 2 (two) times  daily between meals. Patient not taking: Reported on 05/06/2017 12/16/16   Edsel Petrin, DO    Family History Family History  Problem Relation Age of Onset  . Heart disease Mother   . Hypertension Mother   . Diabetes Mother   . Diabetes Father   . Ovarian cancer Sister 37       died in her 81's    Social History Social History  Substance Use Topics  . Smoking status: Never Smoker  . Smokeless tobacco: Never Used  . Alcohol use No     Allergies   Patient has no known allergies.   Review of Systems Review of Systems  Unable to perform ROS: Dementia     Physical Exam Updated Vital Signs BP 120/68   Pulse 92   Temp 99.1 F (37.3 C) (Oral)   Resp 19   Ht 5\' 9"  (1.753 m)   Wt 80.7 kg (178 lb)   SpO2 99%   BMI 26.29 kg/m   Physical Exam  Constitutional: He appears well-developed and well-nourished. No distress.  HENT:  Head: Normocephalic.  Abrasions on forehead with overlying cream Moist mucous membranes  Eyes: Pupils are equal, round, and reactive to light. Conjunctivae are normal.  Neck: Neck supple.  Cardiovascular: Normal rate, regular rhythm and normal heart sounds.   No murmur heard. Pulmonary/Chest: Effort normal.  Diminished breath sounds R>L  Abdominal: Soft. Bowel sounds are normal. He exhibits distension (moderate). There is no tenderness.  Musculoskeletal:  B/l BKA  Neurological: He is alert.  Fluent speech, oriented to person and place and following commands  Skin: Skin is warm and dry.  Psychiatric:  Calm, cooperative  Nursing note and vitals reviewed.    ED Treatments / Results  Labs (all labs ordered are listed, but only abnormal results  are displayed) Labs Reviewed  MRSA PCR SCREENING - Abnormal; Notable for the following:       Result Value   MRSA by PCR POSITIVE (*)    All other components within normal limits  URINALYSIS, ROUTINE W REFLEX MICROSCOPIC - Abnormal; Notable for the following:    APPearance HAZY (*)    Hgb  urine dipstick SMALL (*)    Protein, ur 100 (*)    Leukocytes, UA LARGE (*)    Bacteria, UA MANY (*)    Squamous Epithelial / LPF 0-5 (*)    All other components within normal limits  COMPREHENSIVE METABOLIC PANEL - Abnormal; Notable for the following:    Glucose, Bld 48 (*)    Calcium 7.9 (*)    Total Protein 6.3 (*)    Albumin 2.7 (*)    ALT 10 (*)    GFR calc non Af Amer 56 (*)    All other components within normal limits  BRAIN NATRIURETIC PEPTIDE - Abnormal; Notable for the following:    B Natriuretic Peptide 552.8 (*)    All other components within normal limits  PROTIME-INR - Abnormal; Notable for the following:    Prothrombin Time 24.9 (*)    All other components within normal limits  CBC WITH DIFFERENTIAL/PLATELET - Abnormal; Notable for the following:    WBC 2.8 (*)    RBC 3.00 (*)    Hemoglobin 7.4 (*)    HCT 22.0 (*)    MCV 73.3 (*)    MCH 24.7 (*)    RDW 24.2 (*)    Lymphs Abs 0.3 (*)    All other components within normal limits  GLUCOSE, CAPILLARY - Abnormal; Notable for the following:    Glucose-Capillary 108 (*)    All other components within normal limits  GLUCOSE, CAPILLARY - Abnormal; Notable for the following:    Glucose-Capillary 118 (*)    All other components within normal limits  CBG MONITORING, ED - Abnormal; Notable for the following:    Glucose-Capillary 44 (*)    All other components within normal limits  CBG MONITORING, ED - Abnormal; Notable for the following:    Glucose-Capillary 116 (*)    All other components within normal limits  CULTURE, BLOOD (ROUTINE X 2)  CULTURE, BLOOD (ROUTINE X 2)  URINE CULTURE  LIPASE, BLOOD  GLUCOSE, CAPILLARY  GLUCOSE, CAPILLARY  HEMOGLOBIN Z0C  BASIC METABOLIC PANEL  CBC  I-STAT TROPONIN, ED  I-STAT CG4 LACTIC ACID, ED  CBG MONITORING, ED  CBG MONITORING, ED  CBG MONITORING, ED    EKG  EKG Interpretation None       Radiology Dg Chest 2 View  Result Date: 05/06/2017 CLINICAL DATA:   History of pancreatic cancer and hypoglycemia EXAM: CHEST  2 VIEW COMPARISON:  01/07/2017 FINDINGS: Cardiac shadow is mildly enlarged. A defibrillator is again seen and stable. The lungs are well aerated bilaterally. No focal infiltrate is noted. Small bilateral pleural effusions are seen. No significant pulmonary edema is noted. No bony abnormality is seen. IMPRESSION: Small bilateral pleural effusions. No other focal abnormality is seen. Electronically Signed   By: Inez Catalina M.D.   On: 05/06/2017 11:57   Ct Head Wo Contrast  Result Date: 05/06/2017 CLINICAL DATA:  [place] last night from a chair striking forehead on edge of sink, frontal abrasion, history dementia, CHF, pancreatic cancer, coronary artery disease, type II diabetes mellitus EXAM: CT HEAD WITHOUT CONTRAST TECHNIQUE: Contiguous axial images were obtained from the base of the skull  through the vertex without intravenous contrast. Sagittal and coronal MPR images reconstructed from axial data set. COMPARISON:  04/30/2016 FINDINGS: Brain: Generalized atrophy. Normal ventricular morphology. No midline shift or mass effect. Small vessel chronic ischemic changes of deep cerebral white matter. 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 No intracranial hemorrhage, mass lesion or evidence of acute infarction. No extra-axial fluid collections. Vascular: Atherosclerotic calcification of internal carotid and vertebral arteries at skullbase Skull: Intact. Mild supraorbital scalp contusion/soft tissue swelling. Sinuses/Orbits: Intraorbital soft tissue planes clear. Bony orbits intact. Other: N/A IMPRESSION: Atrophy with small vessel chronic ischemic changes of deep cerebral white matter. Tiny old lacunar infarcts LEFT external capsule. No acute intracranial abnormalities. Electronically Signed   By: Lavonia Dana M.D.   On: 05/06/2017 11:53   Dg Abd Portable 1v  Result Date: 05/06/2017 CLINICAL DATA:  Episodes of hypoglycemia. EXAM: PORTABLE ABDOMEN - 1  VIEW COMPARISON:  None. FINDINGS: The bowel gas pattern is normal. No radio-opaque calculi or other significant radiographic abnormality are seen. Left hip replacement is noted. IMPRESSION: No bowel obstruction. Electronically Signed   By: Abelardo Diesel M.D.   On: 05/06/2017 21:08    Procedures .Critical Care Performed by: Sharlett Iles Authorized by: Sharlett Iles   Critical care provider statement:    Critical care time (minutes):  45   Critical care time was exclusive of:  Separately billable procedures and treating other patients   Critical care was necessary to treat or prevent imminent or life-threatening deterioration of the following conditions:  Endocrine crisis   Critical care was time spent personally by me on the following activities:  Blood draw for specimens, development of treatment plan with patient or surrogate, evaluation of patient's response to treatment, examination of patient, obtaining history from patient or surrogate, ordering and performing treatments and interventions, ordering and review of laboratory studies, ordering and review of radiographic studies, re-evaluation of patient's condition and review of old charts    (including critical care time)  Medications Ordered in ED Medications  dextrose 50 % solution 50 mL (50 mLs Intravenous Given 05/06/17 1103)     Initial Impression / Assessment and Plan / ED Course  I have reviewed the triage vital signs and the nursing notes.  Pertinent labs & imaging results that were available during my care of the patient were reviewed by me and considered in my medical decision making (see chart for details).     Pt w/ h/o dementia, CHF, CAD, pancreatic cancer brought in by EMS for fluctuating blood glucose and hypoglycemia this morning. He was comfortable on exam with reassuring vital signs, temperature 99.1. He did have abdominal distention without focal tenderness. Obtained above lab work as well as chest  x-ray and head CT given his abrasion on his forehead and anticoagulant use.  CXR w/ small b/l pleural effusions. Head CT neg acute. Labs notable for BNP just over 500, UA suggestive of infection, WBC 2.8, Hgb 7.4. Gave CTX, I suspect infection may be exacerbating his labile BG. I am also concerned about possibility of accidental medication overdose as patient has some memory problems and administers his own meds.  His BG has dropped a few times, despite D50 and food when he arrived, therefore started D5 drip. Blood and urine cultures ordered. Also gave dose of IV lasix. Discussed admission w/ Dr. Maryland Pink and pt admitted for further care. Final Clinical Impressions(s) / ED Diagnoses   Final diagnoses:  None    New Prescriptions New Prescriptions  No medications on file     Lyn Joens, Wenda Overland, MD 05/06/17 2150    Rex Kras Wenda [place], MD 05/06/17 2150

## 2017-05-06 NOTE — Telephone Encounter (Signed)
Thanks, Sounds like he needs to be evaluated anyhow. He's got a lot of comorbidities, but could have enteritis, infection, perhaps pancreatitis too. I agree with recommending ED eval if he's acutely ill but if he's able to come in we can look at him. I'm copying Dr. Benay Spice as well.

## 2017-05-06 NOTE — H&P (Signed)
History and Physical  [name] [name] IDP:[id number] DOB: May 26, 1946 DOA: 05/06/2017  Referring physician: Theotis Burrow, ER physician  PCP: Josetta Huddle, MD  Outpatient Specialists: Sharon Mt, oncology Patient coming from: Home & is able to ambulate via wheelchair, does not really walk  Chief Complaint: Nausea and vomiting   HPI: [name] [name] is a 71 y.o. male with medical history significant for metastatic pancreatic cancer, chronic systolic/diastolic congestive heart failure, stage III chronic kidney disease and diabetes mellitus who was brought to the emergency room with complaints of hypoglycemia. Patient's wife who cares for him at home noted that in the last week, she started noticing that he had both elevated as well as big drops in his blood sugars, sometimes ranging as low as the 50s. His mentation did not change, but for the past day or so, he's been having nausea and vomiting. She states his bowel movements have not changed although in the last few days, he felt like his abdomen became much more distended. Patient became weaker and last night, became so weak that he had a near syncopal event scraping his forehead against the bathroom while walking was on the toilet. Patient was brought into the emergency room today via EMS  ED Course: In the emergency room, patient was found to have a large UTI. He was also found to have episodes of hypoglycemia. He was given amps of D50, but still CBG stable persistently in the 50s. Chest x-ray noted mild bilateral pleural effusions and a BNP was mildly elevated in the 500s. Patient's hemoglobin was at 7.4, stable from several days ago, white count 2.8 and rest of labs were unremarkable. Hospitalist call for further evaluation and admission  Review of Systems: Patient seen after arrival to floor . Pt complains of weakness and fatigue. Some mild abdominal discomfort and distention.   Pt denies any headaches, vision changes, dysphagia,  chest pain, palpitations, shortness of breath, wheeze or cough, hematuria, dysuria, constipation, diarrhea, focal extremity numbness weakness or pain .  Review of systems are otherwise negative   Past Medical History:  Diagnosis Date  . Anemia    takes Iron pill daily  . Arthritis   . Automatic implantable cardioverter-defibrillator in situ    a. 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  . CAD (coronary artery disease)   . Cardiomyopathy, nonischemic ([place])    a. Cath 2003: mild nonobstructive CAD, EF 25% at that time.  . Chronic systolic CHF (congestive heart failure) (HCC)    takes Furosemide daily as well as Aldactone  . CKD (chronic kidney disease), stage III   . Complication of anesthesia   . Constipation    takes Sennokot daily  . Dementia   . Family history of ovarian cancer   . Gangrenous toe, Lt toe 09/21/2013  . 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 Checked in July 2017 with [employer] Panel  . GERD (gastroesophageal reflux disease)    takes Nexium daily  . Headache    occasionally  . High cholesterol    takes Atorvastatin daily  . History of blood transfusion    no abnormal reaction noted  . History of bronchitis    > 1 yr ago  . History of kidney stones   . Hypertension    takes Coreg daily  . LBBB (left bundle branch block)    takes Coumadin daily  . NSVT (nonsustained ventricular tachycardia) ([place])  a. Noted on ICD interrogation in 2011.  [name] PAD (peripheral artery disease) ([place])    a. 08/2013 Periph Angio/PTA: Abd Ao nl, RLE- 3v runoff, PT diff dzs, AT 90p, LLE 2v runoff, PT 100, AT 7m(diamondback ORA/chocolate balloon PTA).  .Marland KitchenPAF (paroxysmal atrial fibrillation) ([place]    a. Noted on ICD interrogation 2012;  b. coumadin d/c'd 01/2013.  .Marland KitchenPAF (paroxysmal atrial fibrillation) ([place]   . Panc[place]ic cancer ([place]   . Peripheral neuropathy     hands;numbness and tingling   . Pneumonia    hx of > 89yrgo  . PONV (postoperative nausea and vomiting)   . Presence of permanent cardiac pacemaker   . Type II diabetes mellitus (HCCankton   takes Levemir daily as well as Novolog  . Urinary frequency   . Urinary urgency    Past Surgical History:  Procedure Laterality Date  . ABDOMINAL ANGIOGRAM  09/12/2013   Procedure: ABDOMINAL ANGIOGRAM;  Surgeon: JoLorretta HarpMD;  [facility];  Service: Cardiovascular;;  . AMPUTATION Left 10/04/2013   Procedure: LEFT GREAT TOE AND SECOND TOE AMPUTATION;  Surgeon: NaMarianna PaymentMD;  [facility] Service: Orthopedics;  Laterality: Left;  . AMPUTATION Left 11/09/2013   Procedure: LEFT AMPUTATION BELOW KNEE;  Surgeon: NaMarianna PaymentMD;  [place] [facility] Service: [place]ics;  Laterality: Left;  . AMPUTATION Right 01/04/2014   Procedure: GrDoristine Devoidecond and third toe amputation;  Surgeon: NaMarianna PaymentMD;  [place] Service: Orthopedics;  Laterality: Right;  . AMPUTATION Right 01/30/2014   Procedure: RIGHT BELOW KNEE AMPUTATION;  Surgeon: NaMarianna PaymentMD;  [place] [facility] Service: Orthopedics;  Laterality: Right;  . ATHERECTOMY Right 12/29/2013   Procedure: ATHERECTOMY;  Surgeon: JoLorretta HarpMD;  [facility];  Service: Cardiovascular;  Laterality: Right;  Anterior Tibial Artery  . CARDIAC CATHETERIZATION  10/01/2001   THERE WAS GLOBAL HYPOKINESIS AND EF 25%. THERE APPEARED TO BE GLOBAL DECREASE IN WALL MOTION  . CARDIAC DEFIBRILLATOR PLACEMENT  06/2009   WITH GENERATOR REPLACED; BiV ICD  . CARDIOVASCULAR STRESS TEST  03/20/2009   EF 33%  . [place] COL[place]    . ESOPHAGOGASTRODUODENOSCOPY    . EUS Left 12/15/2016   Procedure: UPPER ENDOSCOPIC ULTRASOUND (EUS) LINEAR;  Surgeon: WiArta SilenceMD;  Location[facility];  Service: Endoscopy;  Laterality: Left;  . HIP ARTHROPLASTY Left 12/28/2015   Procedure: POSTERIOR   APPROACH HEMI HIP ARTHROPLASTY;  Surgeon: NaLeandrew KoyanagiMD;  [facility]: [facility];  Laterality: Left;  . IR CATHETER TUBE CHANGE  12/08/2016  . IR GENERIC HISTORICAL  10/18/2016   IR PERC CHOLECYSTOSTOMY 10/18/2016 MiGreggory KeenMD [facility]-INTER[facility] RAD  . IR RADIOLOGIST EVAL & MGMT  11/20/2016  . IR RADIOLOGIST EVAL & MGMT  12/31/2016  . IR SINUS/FIST TUBE CHK-NON GI  01/14/2017  . LEG AMPUTATION BELOW KNEE Left 11/09/2013   DR XUErlinda Hong. LITHOTRIPSY  2001  . LOWER EXTREMITY ANGIOGRAM Bilateral 09/12/2013   Procedure: LOWER EXTREMITY ANGIOGRAM;  Surgeon: JoLorretta HarpMD;  [facility];  Service: Cardiovascular;  Laterality: Bilateral;  . LOWER EXTREMITY ANGIOGRAM N/A 12/29/2013   Procedure: LOWER EXTREMITY ANGIOGRAM;  Surgeon: JoLorretta HarpMD;  [facility];  Service: Cardiovascular;  Laterality: N/A;  . STUMP REVISION Left 01/03/2015   Procedure: LEFT BELOW KNEE AMPUTATION REVISION;  Surgeon: NaLeandrew Koyanagi  MD;  Location: [place];  Service: Orthopedics;  Laterality: Left;  . TOE AMPUTATION  10/04/2013   LEFT GREAT TOE AND 4TH TOE   /   DR Erlinda Hong  . TRANSLUMINAL ATHERECTOMY TIBIAL ARTERY Left 09/12/2013  . US ECHOCARDIOGRAPHY  03/21/2008   EF 30-35%    Social History:  reports that he has never smoked. He has never used smokeless tobacco. He reports that he does not drink alcohol or use drugs.  Lives at home and is cared for by his wife. Gets around with a wheelchair. Not otherwise ambulatory   No Known Allergies  Family History  Problem Relation Age of Onset  . Heart disease Mother   . Hypertension Mother   . Diabetes Mother   . Diabetes Father   . Ovarian cancer Sister 73       died in her 27's      Prior to Admission medications   Medication Sig Start Date End Date Taking? Authorizing Provider  acetaminophen (TYLENOL) 325 MG tablet Take 2 tablets (650 mg total) by mouth every 6 (six) hours as needed for mild pain (or Fever >/= 101). 05/03/16  Yes Mikhail,  [place], DO  alum & mag hydroxide-simeth (MAALOX/MYLANTA) 200-200-20 MG/5ML suspension Take 15 mLs by mouth every 4 (four) hours as needed for indigestion or heartburn. 12/16/16  Yes Mikhail, Velta Addison, DO  atorvastatin (LIPITOR) 10 MG tablet Take 1 tablet (10 mg total) by mouth daily. 01/15/15  Yes Angiulli, Lavon Paganini, PA-C  capecitabine (XELODA) 500 MG tablet Take 3 tablets ('1500mg'$ ) by mouth in AM and 2 tabs ('1000mg'$ ) in PM, within 31mn of meals, days of radiation only 04/07/17  Yes SLadell Pier MD  carvedilol (COREG) 6.25 MG tablet Take 1 tablet (6.25 mg total) by mouth 2 (two) times daily with a meal. 10/23/16  Yes VVelvet Bathe MD  cholecalciferol (VITAMIN D) 1000 units tablet Take 1,000 Units by mouth daily.   Yes [provider]  furosemide (LASIX) 40 MG tablet Take 1 tablet (40 mg total) by mouth 2 (two) times daily. 04/23/17  Yes MLarey Dresser MD  Insulin Aspart (NOVOLOG [place]) Inject 4 Units into the skin 2 (two) times daily.   Yes [provider]  insulin detemir (LEVEMIR) 100 UNIT/ML injection Inject 0.15 mLs (15 Units total) into the skin 2 (two) times daily. Patient taking differently: Inject 25 Units into the skin 2 (two) times daily.  10/23/16  Yes VVelvet Bathe MD  iron polysaccharides (NIFEREX) 150 MG capsule Take 1 capsule (150 mg total) by mouth daily. 01/15/15  Yes Angiulli, DLavon Paganini PA-C  Multiple Vitamin (MULTIVITAMIN WITH MINERALS) TABS tablet Take 1 tablet by mouth daily.   Yes [provider]  omeprazole (PRILOSEC) 20 MG capsule Take 20 mg by mouth daily.   Yes [provider]  potassium chloride SA (K-DUR,KLOR-CON) 20 MEQ tablet Take 1 tablet (20 mEq total) by mouth 2 (two) times daily. 01/08/17  Yes SPatrecia Pour EChristean Grief MD  pregabalin (LYRICA) 75 MG capsule Take 1 capsule (75 mg total) by mouth 2 (two) times daily. 01/15/15  Yes Angiulli, DLavon Paganini PA-C  rivaroxaban (XARELTO) 20 MG TABS tablet Take 1 tablet (20 mg total) by mouth daily with  supper. 01/08/17  Yes SPatrecia Pour EChristean Grief MD  saccharomyces boulardii (FLORASTOR) 250 MG capsule Take 1 capsule (250 mg total) by mouth 2 (two) times daily. 12/16/16  Yes Mikhail, Maryann, DO  sacubitril-valsartan (ENTRESTO) 24-26 MG Take 1 tablet by mouth 2 (two) times daily.  10/31/15  Yes Deboraha Sprang, MD  tamsulosin (FLOMAX) 0.4 MG CAPS capsule Take 1 capsule (0.4 mg total) by mouth daily after supper. 01/08/17  Yes Patrecia Pour, Christean Grief, MD  ondansetron (ZOFRAN) 8 MG tablet Take 8 mg by mouth every 8 (eight) hours as needed for nausea. 05/06/17   [provider]    Physical Exam: BP 122/62 (BP Location: Right Arm)   Pulse (!) 108   Temp (!) 100.4 F (38 C) (Oral)   Resp 17   Ht '5\' 9"'$  (1.753 m)   Wt 85.7 kg (188 lb 15 oz)   SpO2 97%   BMI 27.90 kg/m   General:  Alert and oriented 2, no acute distress  Eyes: Sclera nonicteric, extraocular movements are intact  ENT: Normocephalic, note few recent superficial skin scraping d  over his central and right side of his forehead, mucous membranes are slightly dry  Neck: Supple, minimal JVD on the left  Cardiovascular: Regular rate and rhythm, S1, S2, frequent ectopic beats, borderline tachycardia decreased breath sounds bibasilar  Respiratory:  Decreased breath sounds bibasilar Abdomen: soft, distended, nontender, few bowel sounds  Skin: no skin breaks, tears or lesions other than described as above on 4  Musculoskeletal: no clubbing or cyanosis, trace pitting edema on the left lower extremity. Status post right BKA  Psychiatric: patient is appropriate, no evidence of psychoses  Neurologic: no immediate focal deficits           Labs on Admission:  Basic Metabolic Panel:  Recent Labs Lab 05/05/17 1022 05/06/17 1127  NA 144 143  K 3.5 3.8  CL 115* 111  CO2 23 23  GLUCOSE 89 48*  BUN 15 19  CREATININE 1.31* 1.24  CALCIUM 8.0* 7.9*   Liver Function Tests:  Recent Labs Lab 05/06/17 1127  AST 18  ALT 10*  ALKPHOS 41    BILITOT 0.5  PROT 6.3*  ALBUMIN 2.7*    Recent Labs Lab 05/06/17 1127  LIPASE 13   No results for input(s): AMMONIA in the last 168 hours. CBC:  Recent Labs Lab 05/04/17 1108 05/06/17 1343  WBC 4.5 2.8*  NEUTROABS 3.8 2.3  HGB 7.5* 7.4*  HCT 22.8* 22.0*  MCV 75.8* 73.3*  PLT 182 172   Cardiac Enzymes: No results for input(s): CKTOTAL, CKMB, CKMBINDEX, TROPONINI in the last 168 hours.  BNP (last 3 results)  Recent Labs  01/02/17 2237 01/08/17 0521 05/06/17 1127  BNP 1,012.6* 1,149.8* 552.8*    ProBNP (last 3 results) No results for input(s): PROBNP in the last 8760 hours.  CBG:  Recent Labs Lab 05/06/17 1305 05/06/17 1355 05/06/17 1513 05/06/17 1550 05/06/17 1714  GLUCAP 86 80 75 72 73    Radiological Exams on Admission: Dg Chest 2 View  Result Date: 05/06/2017 CLINICAL DATA:  History of pancreatic cancer and hypoglycemia EXAM: CHEST  2 VIEW COMPARISON:  01/07/2017 FINDINGS: Cardiac shadow is mildly enlarged. A defibrillator is again seen and stable. The lungs are well aerated bilaterally. No focal infiltrate is noted. Small bilateral pleural effusions are seen. No significant pulmonary edema is noted. No bony abnormality is seen. IMPRESSION: Small bilateral pleural effusions. No other focal abnormality is seen. Electronically Signed   By: Inez Catalina M.D.   On: 05/06/2017 11:57   Ct Head Wo Contrast  Result Date: 05/06/2017 CLINICAL DATA:  [place] last night from a chair striking forehead on edge of sink, frontal abrasion, history dementia, CHF, pancreatic cancer, coronary artery disease, type II diabetes mellitus  EXAM: CT HEAD WITHOUT CONTRAST TECHNIQUE: Contiguous axial images were obtained from the base of the skull through the vertex without intravenous contrast. Sagittal and coronal MPR images reconstructed from axial data set. COMPARISON:  04/30/2016 FINDINGS: Brain: Generalized atrophy. Normal ventricular morphology. No midline shift or mass effect.  Small vessel chronic ischemic changes of deep cerebral white matter. 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 No intracranial hemorrhage, mass lesion or evidence of acute infarction. No extra-axial fluid collections. Vascular: Atherosclerotic calcification of internal carotid and vertebral arteries at skullbase Skull: Intact. Mild supraorbital scalp contusion/soft tissue swelling. Sinuses/Orbits: Intraorbital soft tissue planes clear. Bony orbits intact. Other: N/A IMPRESSION: Atrophy with small vessel chronic ischemic changes of deep cerebral white matter. Tiny old lacunar infarcts LEFT external capsule. No acute intracranial abnormalities. Electronically Signed   By: Lavonia Dana M.D.   On: 05/06/2017 11:53    EKG: Not done   Assessment/Plan Present on Admission: . Diabetes mellitus 2 uncontrolled with long-term use of insulin with secondary Hypoglycemia: Suspect hypoglycemia secondary to infection. Have started very gentle IV fluids with dextrose. When sugars consistently staying elevated, will discontinue drip. [name] Acute on Chronic combined systolic (congestive) and diastolic (congestive) heart failure [facility]): Given mild pleural effusion seen on chest x-ray was minimally elevated BNP, suspect patient already mildly volume overloaded. In addition, we are giving him additional fluids for his hypoglycemia. Echocardiogram done 10/14/16 noted EF of 20-25 percent plus grade 2 diastolic dysfunction and mild to moderate mitral regurg. Will continue entresto and change by mouth Lasix to IV . HTN (hypertension)  . PAF (paroxysmal atrial fibrillation) ([place] status post biventricular ICD): Chads 2 score of 6. On xarelto . Dementia: Mild, stable, no evidence of acute behavioral disturbance . Stage 3 chronic kidney disease: Creatinine at 1.24. At baseline . Acute lower UTI: Awaiting urine cultures. IV Rocephin. . 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  Principal Problem:    Hypoglycemia Active Problems:   NICM- EF 20-25% echo 6/14   Chronic combined systolic (congestive) and diastolic (congestive) heart failure (HCC)   HTN (hypertension)   BiV ICD (BS).  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

## 2017-05-06 NOTE — Telephone Encounter (Signed)
He should be seen in [facility], or here

## 2017-05-06 NOTE — Progress Notes (Signed)
Pt with 6 beats asymptomatic V-tach.  MD paged. Will continue to monitor. Jeremian Whitby A

## 2017-05-06 NOTE — Telephone Encounter (Signed)
Called the patient spoke with wife and she stated patient blood sugar was so low last night paramedics were called and gave him sugar water ,hes been drinking orange juice to get it back up, threw up with the rx creon, asked how patient was doing this morning"He is sleeping,should I give him his medication?he isn't eating,?' asked if she could get his blood sugar first and if still low she should take him to the ER  In the mean time I weill call in nausea medication [facility]fran [facility], Mrs. Gaede thanked this Rn and will check his blood sugar now, [facility] is [facility]. [facility] left voice message of RX Zofran 8mg  oral tid wih 1 refill, 90 tablets dispense, gave my number to call back with any questions concerning rx 9:26 AM

## 2017-05-06 NOTE — ED Notes (Signed)
ED TO INPATIENT HANDOFF REPORT  Name/Age/Gender [name] [name] 71 y.o. male  Code Status Code Status History    Date Active Date Inactive Code Status Order ID Comments User Context   01/03/2017  3:34 AM 01/08/2017  6:57 PM Full Code [id number]  Edwin Dada, MD Inpatient   12/08/2016  6:51 PM 12/16/2016  6:56 PM Full Code [id number]  Elgergawy, Silver Huguenin, MD Inpatient   10/13/2016  5:35 PM 10/23/2016  8:29 PM Full Code [id number]  Brenton Grills, PA-C ED   04/30/2016  4:25 AM 05/03/2016  4:15 PM Full Code [id number]  Rise Patience, MD ED   12/28/2015 12:41 AM 01/02/2016  3:25 PM Full Code [id number]  Ivor Costa, MD ED   01/08/2015  1:47 PM 01/08/2015  1:47 PM Full Code [id number]  Cathlyn Parsons, PA-C Inpatient   01/08/2015  1:47 PM 01/15/2015  5:13 PM Full Code [id number]  Cathlyn Parsons, PA-C Inpatient   01/03/2015  5:23 PM 01/08/2015  1:47 PM Full Code [id number]  Leandrew Koyanagi, MD Inpatient   02/02/2014  6:36 PM 02/14/2014  2:09 PM Full Code [id number]  Cathlyn Parsons, PA-C Inpatient   01/30/2014  6:33 PM 02/02/2014  6:36 PM Full Code [id number]  Marianna Payment, MD Inpatient   01/04/2014  4:49 PM 01/11/2014  2:46 PM Full Code [id number]  Marianna Payment, MD Inpatient   12/29/2013  8:36 PM 12/30/2013  6:08 PM Full Code [id number]  Lorretta Harp, MD Inpatient   11/14/2013  3:48 PM 11/28/2013  4:50 PM Full Code [id number]  Cathlyn Parsons, PA-C Inpatient   11/14/2013  3:48 PM 11/14/2013  3:48 PM Full Code [id number]  Cathlyn Parsons, PA-C Inpatient   11/09/2013  6:07 PM 11/14/2013  3:48 PM Full Code [id number]  Marianna Payment, MD Inpatient   10/11/2013  1:55 PM 11/09/2013  6:07 PM Full Code [id number]  Hennie Duos, MD Outpatient   10/04/2013  9:23 AM 10/07/2013  4:44 PM Full Code [id number]  Marianna Payment, MD Inpatient   02/13/2013 12:47 AM 02/18/2013  6:47 PM Full Code [id number]  Orvan Falconer, MD Inpatient      Home/SNF/Other Home  Chief Complaint hypoglycemia  Level  of Care/Admitting Diagnosis ED Disposition    ED Disposition Condition [facility] Area: [facility] [[id number]]  Level of Care: Telemetry [5]  Admit to tele based on following criteria: Acute CHF  Diagnosis: Hypoglycemia [809983]  Admitting Physician: Junius Argyle  Attending Physician: Annita Brod [[id number]]  Estimated length of stay: past midnight tomorrow  Certification:: I certify this patient will need inpatient services for at least 2 midnights  PT Class (Do Not Modify): Inpatient [101]  PT Acc Code (Do Not Modify): Private [1]       Medical History Past Medical History:  Diagnosis Date  . Anemia    takes Iron pill daily  . Arthritis   . Automatic implantable cardioverter-defibrillator in situ    a. 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  . CAD (coronary artery disease)   . Cardiomyopathy, nonischemic ([place])    a. Cath 2003: mild nonobstructive CAD, EF 25% at that time.  . Chronic systolic CHF (congestive heart failure) (HCC)    takes Furosemide daily as well as Aldactone  . CKD (chronic kidney disease), stage III   . Complication of anesthesia   . Constipation    takes Sennokot daily  .  Dementia   . Family history of ovarian cancer   . Gangrenous toe, Lt toe 09/21/2013  . 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 Checked in July 2017 with [employer] Panel  . GERD (gastroesophageal reflux disease)    takes Nexium daily  . Headache    occasionally  . High cholesterol    takes Atorvastatin daily  . History of blood transfusion    no abnormal reaction noted  . History of bronchitis    > 1 yr ago  . History of kidney stones   . Hypertension    takes Coreg daily  . LBBB (left bundle branch block)    takes Coumadin daily  . NSVT (nonsustained ventricular tachycardia) ([place])    a. Noted on ICD interrogation in  2011.  [name] PAD (peripheral artery disease) ([place])    a. 08/2013 Periph Angio/PTA: Abd Ao nl, RLE- 3v runoff, PT diff dzs, AT 90p, LLE 2v runoff, PT 100, AT 27m(diamondback ORA/chocolate balloon PTA).  .Marland KitchenPAF (paroxysmal atrial fibrillation) ([place]    a. Noted on ICD interrogation 2012;  b. coumadin d/c'd 01/2013.  .Marland KitchenPAF (paroxysmal atrial fibrillation) ([place]   . Pancreatic cancer ([place]   . Peripheral neuropathy    hands;numbness and tingling   . Pneumonia    hx of > 180yrgo  . PONV (postoperative nausea and vomiting)   . Presence of permanent cardiac pacemaker   . Type II diabetes mellitus ([place]   takes Levemir daily as well as Novolog  . Urinary frequency   . Urinary urgency     Allergies No Known Allergies  IV Location/Drains/Wounds Patient Lines/Drains/Airways Status   Active Line/Drains/Airways    Name:   Placement date:   Placement time:   Site:   Days:   Peripheral IV 04/08/17 Left Antecubital  04/08/17    1050    Antecubital    28   Peripheral IV 05/06/17 Left Antecubital  05/06/17    1041    Antecubital    less than 1   Biliary Tube Other (Comment) 10 Fr. RUQ  12/08/16    1824    RUQ    149   Incision (Closed) 01/30/14 Leg Right  01/30/14    1538      1192   Incision (Closed) 01/03/15 Leg Left  01/03/15    1204      854   Incision (Closed) 12/28/15 Hip Left  12/28/15    1819      495          Labs/Imaging Results for orders placed or performed during the hospital encounter of 05/06/17 (from the past 48 hour(s))  CBG monitoring, ED     Status: Abnormal   Collection Time: 05/06/17 10:46 AM  Result Value Ref Range   Glucose-Capillary 44 (LL) 65 - 99 mg/dL  Urinalysis, Routine w reflex microscopic     Status: Abnormal   Collection Time: 05/06/17 11:27 AM  Result Value Ref Range   Color, Urine YELLOW YELLOW   APPearance HAZY (A) CLEAR   Specific Gravity, Urine 1.011 1.005 - 1.030   pH 5.0 5.0 - 8.0   Glucose, UA NEGATIVE NEGATIVE mg/dL   Hgb urine dipstick SMALL (A)  NEGATIVE   Bilirubin Urine NEGATIVE NEGATIVE   Ketones, ur NEGATIVE NEGATIVE mg/dL   Protein, ur 100 (A) NEGATIVE mg/dL   Nitrite NEGATIVE NEGATIVE   Leukocytes, UA LARGE (A) NEGATIVE  RBC / HPF 0-5 0 - 5 RBC/hpf   WBC, UA TOO NUMEROUS TO COUNT 0 - 5 WBC/hpf   Bacteria, UA MANY (A) NONE SEEN   Squamous Epithelial / LPF 0-5 (A) NONE SEEN   Mucus PRESENT    Hyaline Casts, UA PRESENT   Comprehensive metabolic panel     Status: Abnormal   Collection Time: 05/06/17 11:27 AM  Result Value Ref Range   Sodium 143 135 - 145 mmol/L   Potassium 3.8 3.5 - 5.1 mmol/L   Chloride 111 101 - 111 mmol/L   CO2 23 22 - 32 mmol/L   Glucose, Bld 48 (L) 65 - 99 mg/dL   BUN 19 6 - 20 mg/dL   Creatinine, Ser 1.24 0.61 - 1.24 mg/dL   Calcium 7.9 (L) 8.9 - 10.3 mg/dL   Total Protein 6.3 (L) 6.5 - 8.1 g/dL   Albumin 2.7 (L) 3.5 - 5.0 g/dL   AST 18 15 - 41 U/L   ALT 10 (L) 17 - 63 U/L   Alkaline Phosphatase 41 38 - 126 U/L   Total Bilirubin 0.5 0.3 - 1.2 mg/dL   GFR calc non Af Amer 56 (L) >60 mL/min   GFR calc Af Amer >60 >60 mL/min    Comment: (NOTE) The eGFR has been calculated using the CKD EPI equation. This calculation has not been validated in all clinical situations. eGFR's persistently <60 mL/min signify possible Chronic Kidney Disease.    Anion gap 9 5 - 15  Lipase, blood     Status: None   Collection Time: 05/06/17 11:27 AM  Result Value Ref Range   Lipase 13 11 - 51 U/L  Brain natriuretic peptide     Status: Abnormal   Collection Time: 05/06/17 11:27 AM  Result Value Ref Range   B Natriuretic Peptide 552.8 (H) 0.0 - 100.0 pg/mL  Protime-INR     Status: Abnormal   Collection Time: 05/06/17 11:52 AM  Result Value Ref Range   Prothrombin Time 24.9 (H) 11.4 - 15.2 seconds   INR 2.28   I-stat troponin, ED     Status: None   Collection Time: 05/06/17 11:56 AM  Result Value Ref Range   Troponin i, poc 0.00 0.00 - 0.08 ng/mL   Comment 3            Comment: Due to the release kinetics  of cTnI, a negative result within the first hours of the onset of symptoms does not rule out myocardial infarction with certainty. If myocardial infarction is still suspected, repeat the test at appropriate intervals.   I-Stat CG4 Lactic Acid, ED     Status: None   Collection Time: 05/06/17 11:59 AM  Result Value Ref Range   Lactic Acid, Venous 1.11 0.5 - 1.9 mmol/L  CBG monitoring, ED     Status: Abnormal   Collection Time: 05/06/17 12:21 PM  Result Value Ref Range   Glucose-Capillary 116 (H) 65 - 99 mg/dL   Comment 1 Notify RN    Comment 2 Call MD NNP PA CNM   CBG monitoring, ED     Status: None   Collection Time: 05/06/17  1:05 PM  Result Value Ref Range   Glucose-Capillary 86 65 - 99 mg/dL  CBC with Differential     Status: Abnormal   Collection Time: 05/06/17  1:43 PM  Result Value Ref Range   WBC 2.8 (L) 4.0 - 10.5 K/uL   RBC 3.00 (L) 4.22 - 5.81 MIL/uL   Hemoglobin  7.4 (L) 13.0 - 17.0 g/dL   HCT 44.3 (L) 60.1 - 65.8 %   MCV 73.3 (L) 78.0 - 100.0 fL   MCH 24.7 (L) 26.0 - 34.0 pg   MCHC 33.6 30.0 - 36.0 g/dL   RDW 00.6 (H) 34.9 - 49.4 %   Platelets 172 150 - 400 K/uL   Neutrophils Relative % 83 %   Lymphocytes Relative 9 %   Monocytes Relative 5 %   Eosinophils Relative 3 %   Basophils Relative 0 %   Neutro Abs 2.3 1.7 - 7.7 K/uL   Lymphs Abs 0.3 (L) 0.7 - 4.0 K/uL   Monocytes Absolute 0.1 0.1 - 1.0 K/uL   Eosinophils Absolute 0.1 0.0 - 0.7 K/uL   Basophils Absolute 0.0 0.0 - 0.1 K/uL   RBC Morphology TARGET CELLS     Comment: ELLIPTOCYTES  CBG monitoring, ED     Status: None   Collection Time: 05/06/17  1:55 PM  Result Value Ref Range   Glucose-Capillary 80 65 - 99 mg/dL   Dg Chest 2 View  Result Date: 05/06/2017 CLINICAL DATA:  History of pancreatic cancer and hypoglycemia EXAM: CHEST  2 VIEW COMPARISON:  01/07/2017 FINDINGS: Cardiac shadow is mildly enlarged. A defibrillator is again seen and stable. The lungs are well aerated bilaterally. No focal  infiltrate is noted. Small bilateral pleural effusions are seen. No significant pulmonary edema is noted. No bony abnormality is seen. IMPRESSION: Small bilateral pleural effusions. No other focal abnormality is seen. Electronically Signed   By: Alcide Clever M.D.   On: 05/06/2017 11:57   Ct Head Wo Contrast  Result Date: 05/06/2017 CLINICAL DATA:  Larey Seat last night from a chair striking forehead on edge of sink, frontal abrasion, history dementia, CHF, pancreatic cancer, coronary artery disease, type II diabetes mellitus EXAM: CT HEAD WITHOUT CONTRAST TECHNIQUE: Contiguous axial images were obtained from the base of the skull through the vertex without intravenous contrast. Sagittal and coronal MPR images reconstructed from axial data set. COMPARISON:  04/30/2016 FINDINGS: Brain: Generalized atrophy. Normal ventricular morphology. No midline shift or mass effect. Small vessel chronic ischemic changes of deep cerebral white matter. 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 No intracranial hemorrhage, mass lesion or evidence of acute infarction. No extra-axial fluid collections. Vascular: Atherosclerotic calcification of internal carotid and vertebral arteries at skullbase Skull: Intact. Mild supraorbital scalp contusion/soft tissue swelling. Sinuses/Orbits: Intraorbital soft tissue planes clear. Bony orbits intact. Other: N/A IMPRESSION: Atrophy with small vessel chronic ischemic changes of deep cerebral white matter. Tiny old lacunar infarcts LEFT external capsule. No acute intracranial abnormalities. Electronically Signed   By: Ulyses Southward M.D.   On: 05/06/2017 11:53    Pending Labs [employer]    Start     Ordered   05/06/17 1127  Urine culture  STAT,   STAT    Question:  Patient immune status  Answer:  Immunocompromised   05/06/17 1126   05/06/17 1126  Culture, blood (routine x 2)  BLOOD CULTURE X 2,   STAT    Question:  Patient immune status  Answer:  Immunocompromised   05/06/17 1126    Signed and Held  Basic metabolic panel  Tomorrow morning,   R     Signed and Held   Signed and Held  CBC  Tomorrow morning,   R     Signed and Held   Signed and Held  Hemoglobin A1c  Once,   R    Comments:  To assess  prior glycemic control    Signed and Held      Vitals/Pain Today's Vitals   05/06/17 1046 05/06/17 1047 05/06/17 1050 05/06/17 1400  BP:   120/68   Pulse:   92 (!) 104  Resp:   19 20  Temp:   99.1 F (37.3 C)   TempSrc:   Oral   SpO2: 97%  99% 97%  Weight:  178 lb (80.7 kg)    Height:  '5\' 9"'$  (1.753 m)    PainSc: 0-No pain       Isolation Precautions No active isolations  Medications Medications  tamsulosin (FLOMAX) capsule 0.4 mg (not administered)  sacubitril-valsartan (ENTRESTO) 24-26 mg per tablet (not administered)  saccharomyces boulardii (FLORASTOR) capsule 250 mg (not administered)  rivaroxaban (XARELTO) tablet 20 mg (not administered)  pregabalin (LYRICA) capsule 75 mg (not administered)  pantoprazole (PROTONIX) EC tablet 40 mg (not administered)  multivitamin with minerals tablet 1 tablet (not administered)  furosemide (LASIX) tablet 40 mg (not administered)  carvedilol (COREG) tablet 6.25 mg (not administered)  iron polysaccharides (NIFEREX) capsule 150 mg (not administered)  dextrose 50 % solution 50 mL (50 mLs Intravenous Given 05/06/17 1103)  cefTRIAXone (ROCEPHIN) 1 g in dextrose 5 % 50 mL IVPB (0 g Intravenous Stopped 05/06/17 1408)  furosemide (LASIX) injection 40 mg (40 mg Intravenous Given 05/06/17 1314)  dextrose 5 % solution ( Intravenous New Bag/Given 05/06/17 1323)    Mobility non-ambulatory

## 2017-05-07 ENCOUNTER — Telehealth: Payer: Self-pay | Admitting: *Deleted

## 2017-05-07 ENCOUNTER — Ambulatory Visit
Admission: RE | Admit: 2017-05-07 | Discharge: 2017-05-07 | Disposition: A | Discharge status: Home / Self Care | Payer: Medicare Other | Source: Ambulatory Visit | Attending: Radiation Oncology | Admitting: Radiation Oncology

## 2017-05-07 LAB — CBC
HCT: 18.9 % — ABNORMAL LOW (ref 39.0–52.0)
Hemoglobin: 6.3 g/dL — CL (ref 13.0–17.0)
MCH: 24.4 pg — ABNORMAL LOW (ref 26.0–34.0)
MCHC: 33.3 g/dL (ref 30.0–36.0)
MCV: 73.3 fL — ABNORMAL LOW (ref 78.0–100.0)
PLATELETS: 145 10*3/uL — AB (ref 150–400)
RBC: 2.58 MIL/uL — AB (ref 4.22–5.81)
RDW: 24.7 % — ABNORMAL HIGH (ref 11.5–15.5)
WBC: 2.7 10*3/uL — AB (ref 4.0–10.5)

## 2017-05-07 LAB — OCCULT BLOOD X 1 CARD TO LAB, STOOL: Fecal Occult Bld: POSITIVE — AB

## 2017-05-07 LAB — GLUCOSE, CAPILLARY
GLUCOSE-CAPILLARY: 118 mg/dL — AB (ref 65–99)
Glucose-Capillary: 105 mg/dL — ABNORMAL HIGH (ref 65–99)
Glucose-Capillary: 95 mg/dL (ref 65–99)

## 2017-05-07 LAB — BASIC METABOLIC PANEL
Anion gap: 6 (ref 5–15)
BUN: 17 mg/dL (ref 6–20)
CHLORIDE: 115 mmol/L — AB (ref 101–111)
CO2: 24 mmol/L (ref 22–32)
Calcium: 7.5 mg/dL — ABNORMAL LOW (ref 8.9–10.3)
Creatinine, Ser: 1.27 mg/dL — ABNORMAL HIGH (ref 0.61–1.24)
GFR calc non Af Amer: 54 mL/min — ABNORMAL LOW (ref >60)
Glucose, Bld: 101 mg/dL — ABNORMAL HIGH (ref 65–99)
POTASSIUM: 3.7 mmol/L (ref 3.5–5.1)
Sodium: 145 mmol/L (ref 135–145)

## 2017-05-07 LAB — PREPARE RBC (CROSSMATCH)

## 2017-05-07 MED ORDER — SACUBITRIL-VALSARTAN 24-26 MG PO TABS: 1.0000 | ORAL_TABLET | Freq: Two times a day (BID) | ORAL | Status: DC

## 2017-05-07 MED ORDER — FUROSEMIDE 10 MG/ML IJ SOLN
40.0000 mg | Freq: Every day | INTRAMUSCULAR | Status: DC
  Administered 2017-05-08 – 2017-05-09 (×2): 40 mg via INTRAVENOUS
  Filled 2017-05-07 (×2): qty 4

## 2017-05-07 MED ORDER — CHLORHEXIDINE GLUCONATE CLOTH 2 % EX PADS
6.0000 | MEDICATED_PAD | Freq: Every day | CUTANEOUS | Status: AC
  Administered 2017-05-09 – 2017-05-12 (×3): 6 via TOPICAL

## 2017-05-07 MED ORDER — MUPIROCIN 2 % EX OINT
1.0000 "application " | TOPICAL_OINTMENT | Freq: Two times a day (BID) | CUTANEOUS | Status: AC
  Administered 2017-05-07 – 2017-05-11 (×10): 1 via NASAL
  Filled 2017-05-07: qty 22

## 2017-05-07 MED ORDER — SODIUM CHLORIDE 0.9 % IV SOLN: Freq: Once | INTRAVENOUS | Status: DC

## 2017-05-07 MED ORDER — MUPIROCIN 2 % EX OINT
TOPICAL_OINTMENT | CUTANEOUS | Status: AC
  Filled 2017-05-07: qty 22

## 2017-05-07 MED ORDER — METRONIDAZOLE IN NACL 5-0.79 MG/ML-% IV SOLN
500.0000 mg | Freq: Three times a day (TID) | INTRAVENOUS | Status: DC
  Administered 2017-05-07 – 2017-05-09 (×7): 500 mg via INTRAVENOUS
  Filled 2017-05-07 (×8): qty 100

## 2017-05-07 MED ORDER — FUROSEMIDE 10 MG/ML IJ SOLN
20.0000 mg | Freq: Once | INTRAMUSCULAR | Status: AC
  Administered 2017-05-07: 20 mg via INTRAVENOUS
  Filled 2017-05-07: qty 2

## 2017-05-07 NOTE — Telephone Encounter (Signed)
[facility] room 1411 spoke with RN Hillary, patient to get PRBC'S today, asked if he wanted to still get his raiation, patient voiced yes, his appt time is 1040am,  Patient can come via his bed, thanked Publishing rights manager and called linac#[phone], spoke with Nicki Reaper and Threasa Beards, patient to get  his treatment this am before his blood transfusion 10:04 AM

## 2017-05-07 NOTE — Consult Note (Signed)
[facility] Consultation Note  Referring Provider: Dr. Niel Hummer Primary Care Physician:  [name] Huddle, MD  Reason for Consultation:  anemia  HPI: [name] [name] is a 71 y.o. male admitted with diarrhea.  Actively treated for metastatic pancreatic cancer.  Has anemia without overt blood loss.  On rivaroxaban.  No hematemesis, melena, hematochezia.   Past Medical History:  Diagnosis Date  . Anemia    takes Iron pill daily  . Arthritis   . Automatic implantable cardioverter-defibrillator in situ    a. 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  . CAD (coronary artery disease)   . Cardiomyopathy, nonischemic ([place])    a. Cath 2003: mild nonobstructive CAD, EF 25% at that time.  . Chronic systolic CHF (congestive heart failure) (HCC)    takes Furosemide daily as well as Aldactone  . CKD (chronic kidney disease), stage III   . Complication of anesthesia   . Constipation    takes Sennokot daily  . Dementia   . Family history of ovarian cancer   . Gangrenous toe, Lt toe 09/21/2013  . 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 Checked in July 2017 with [employer] Panel  . GERD (gastroesophageal reflux disease)    takes Nexium daily  . Headache    occasionally  . High cholesterol    takes Atorvastatin daily  . History of blood transfusion    no abnormal reaction noted  . History of bronchitis    > 1 yr ago  . History of kidney stones   . Hypertension    takes Coreg daily  . LBBB (left bundle branch block)    takes Coumadin daily  . NSVT (nonsustained ventricular tachycardia) ([place])    a. Noted on ICD interrogation in 2011.  [name] PAD (peripheral artery disease) ([place])    a. 08/2013 Periph Angio/PTA: Abd Ao nl, RLE- 3v runoff, PT diff dzs, AT 90p, LLE 2v runoff, PT 100, AT 80m(diamondback ORA/chocolate balloon PTA).  .Marland KitchenPAF (paroxysmal atrial fibrillation)  ([place]    a. Noted on ICD interrogation 2012;  b. coumadin d/c'd 01/2013.  .Marland KitchenPAF (paroxysmal atrial fibrillation) ([place]   . Pancreatic cancer ([place]   . Peripheral neuropathy    hands;numbness and tingling   . Pneumonia    hx of > 89yrgo  . PONV (postoperative nausea and vomiting)   . Presence of permanent cardiac pacemaker   . Type II diabetes mellitus ([place]   takes Levemir daily as well as Novolog  . Urinary frequency   . Urinary urgency     Past Surgical History:  Procedure Laterality Date  . ABDOMINAL ANGIOGRAM  09/12/2013   Procedure: ABDOMINAL ANGIOGRAM;  Surgeon: JoLorretta HarpMD;  [facility];  Service: Cardiovascular;;  . AMPUTATION Left 10/04/2013   Procedure: LEFT GREAT TOE AND SECOND TOE AMPUTATION;  Surgeon: NaMarianna PaymentMD;  [facility];  Laterality: Left;  . AMPUTATION Left 11/09/2013   Procedure: LEFT AMPUTATION BELOW KNEE;  Surgeon: NaMarianna PaymentMD;  [place]: [place];  Laterality: Left;  . AMPUTATION Right 01/04/2014   Procedure: GrDoristine Devoidecond and third toe amputation;  Surgeon: NaMarianna PaymentMD;  [facility];  Laterality: Right;  . AMPUTATION Right 01/30/2014   Procedure: RIGHT BELOW KNEE AMPUTATION;  Surgeon: NaMarianna PaymentMD;  [facility]  Service: Orthopedics;  Laterality: Right;  . ATHERECTOMY Right 12/29/2013   Procedure: ATHERECTOMY;  Surgeon: Lorretta Harp, MD;  Location[facility] C[facility] LAB;  Service: Cardiovascular;  Laterality: Right;  Anterior Tibial Artery  . CARDIAC CATHETERIZATION  10/01/2001   THERE WAS GLOBAL HYPOKINESIS AND EF 25%. THERE APPEARED TO BE GLOBAL DECREASE IN WALL MOTION  . CARDIAC DEFIBRILLATOR PLACEMENT  06/2009   WITH GENERATOR REPLACED; BiV ICD  . CARDIOVASCULAR STRESS TEST  03/20/2009   EF 33%  . [place]  . COLONOSCOPY    . ESOPHAGOGASTRODUODENOSCOPY    . EUS Left 12/15/2016   Procedure: UPPER ENDOSCOPIC  ULTRASOUND (EUS) LINEAR;  Surgeon: Arta Silence, MD;  Location: [facility];  Service: Endoscopy;  Laterality: Left;  . HIP ARTHROPLASTY Left 12/28/2015   Procedure: POSTERIOR  APPROACH HEMI HIP ARTHROPLASTY;  Surgeon: Leandrew Koyanagi, MD;  Location: [place];  Service: Orthopedics;  Laterality: Left;  . IR CATHETER TUBE CHANGE  12/08/2016  . IR GENERIC HISTORICAL  10/18/2016   IR PERC CHOLECYSTOSTOMY 10/18/2016 [name], MD [facility]  . IR RADIOLOGIST EVAL & MGMT  11/20/2016  . IR RADIOLOGIST EVAL & MGMT  12/31/2016  . IR SINUS/FIST TUBE CHK-NON GI  01/14/2017  . LEG AMPUTATION BELOW KNEE Left 11/09/2013   DR Erlinda Hong  . LITHOTRIPSY  2001  . LOWER EXTREMITY ANGIOGRAM Bilateral 09/12/2013   Procedure: LOWER EXTREMITY ANGIOGRAM;  Surgeon: Lorretta Harp, MD;  Location: [facility] CATH LAB;  Service: Cardiovascular;  Laterality: Bilateral;  . LOWER EXTREMITY ANGIOGRAM N/A 12/29/2013   Procedure: LOWER EXTREMITY ANGIOGRAM;  Surgeon: Lorretta Harp, MD;  Location: [facility] CATH LAB;  Service: Cardiovascular;  Laterality: N/A;  . STUMP REVISION Left 01/03/2015   Procedure: LEFT BELOW KNEE AMPUTATION REVISION;  Surgeon: Leandrew Koyanagi, MD;  Location: [place];  Service: Orthopedics;  Laterality: Left;  . TOE AMPUTATION  10/04/2013   LEFT GREAT TOE AND 4TH TOE   /   DR Erlinda Hong  . TRANSLUMINAL ATHERECTOMY TIBIAL ARTERY Left 09/12/2013  . US ECHOCARDIOGRAPHY  03/21/2008   EF 30-35%    Prior to Admission medications   Medication Sig Start Date End Date Taking? Authorizing Provider  acetaminophen (TYLENOL) 325 MG tablet Take 2 tablets (650 mg total) by mouth every 6 (six) hours as needed for mild pain (or Fever >/= 101). 05/03/16  Yes Mikhail, [place], DO  alum & mag hydroxide-simeth (MAALOX/MYLANTA) 200-200-20 MG/5ML suspension Take 15 mLs by mouth every 4 (four) hours as needed for indigestion or heartburn. 12/16/16  Yes Mikhail, Velta Addison, DO  atorvastatin (LIPITOR) 10 MG tablet Take 1 tablet (10 mg total) by mouth daily. 01/15/15  Yes  Angiulli, Lavon Paganini, PA-C  capecitabine (XELODA) 500 MG tablet Take 3 tablets ('1500mg'$ ) by mouth in AM and 2 tabs ('1000mg'$ ) in PM, within 77mn of meals, days of radiation only 04/07/17  Yes SLadell Pier MD  carvedilol (COREG) 6.25 MG tablet Take 1 tablet (6.25 mg total) by mouth 2 (two) times daily with a meal. 10/23/16  Yes VVelvet Bathe MD  cholecalciferol (VITAMIN D) 1000 units tablet Take 1,000 Units by mouth daily.   Yes [provider]  furosemide (LASIX) 40 MG tablet Take 1 tablet (40 mg total) by mouth 2 (two) times daily. 04/23/17  Yes MLarey Dresser MD  Insulin Aspart (NOVOLOG [place]) Inject 4 Units into the skin 2 (two) times daily.   Yes [provider]  insulin detemir (LEVEMIR) 100 UNIT/ML injection Inject 0.15  mLs (15 Units total) into the skin 2 (two) times daily. Patient taking differently: Inject 25 Units into the skin 2 (two) times daily.  10/23/16  Yes Velvet Bathe, MD  iron polysaccharides (NIFEREX) 150 MG capsule Take 1 capsule (150 mg total) by mouth daily. 01/15/15  Yes Angiulli, Lavon Paganini, PA-C  Multiple Vitamin (MULTIVITAMIN WITH MINERALS) TABS tablet Take 1 tablet by mouth daily.   Yes [provider]  omeprazole (PRILOSEC) 20 MG capsule Take 20 mg by mouth daily.   Yes [provider]  potassium chloride SA (K-DUR,KLOR-CON) 20 MEQ tablet Take 1 tablet (20 mEq total) by mouth 2 (two) times daily. 01/08/17  Yes Patrecia Pour, Christean Grief, MD  pregabalin (LYRICA) 75 MG capsule Take 1 capsule (75 mg total) by mouth 2 (two) times daily. 01/15/15  Yes Angiulli, Lavon Paganini, PA-C  rivaroxaban (XARELTO) 20 MG TABS tablet Take 1 tablet (20 mg total) by mouth daily with supper. 01/08/17  Yes Patrecia Pour, Christean Grief, MD  saccharomyces boulardii (FLORASTOR) 250 MG capsule Take 1 capsule (250 mg total) by mouth 2 (two) times daily. 12/16/16  Yes Mikhail, Maryann, DO  sacubitril-valsartan (ENTRESTO) 24-26 MG Take 1 tablet by mouth 2 (two) times daily. 10/31/15  Yes Deboraha Sprang, MD  tamsulosin (FLOMAX) 0.4 MG CAPS capsule Take 1 capsule (0.4 mg total) by mouth daily after supper. 01/08/17  Yes Patrecia Pour, Christean Grief, MD  ondansetron (ZOFRAN) 8 MG tablet Take 8 mg by mouth every 8 (eight) hours as needed for nausea. 05/06/17   [provider]    Current Facility-Administered Medications  Medication Dose Route Frequency Provider Last Rate Last Dose  . 0.9 %  sodium chloride infusion   Intravenous Once Regalado, Belkys A, MD      . acetaminophen (TYLENOL) tablet 650 mg  650 mg Oral Q6H PRN Annita Brod, MD       Or  . acetaminophen (TYLENOL) suppository 650 mg  650 mg Rectal Q6H PRN Annita Brod, MD      . carvedilol (COREG) tablet 6.25 mg  6.25 mg Oral BID WC Annita Brod, MD   6.25 mg at 05/07/17 1019  . cefTRIAXone (ROCEPHIN) 1 g in dextrose 5 % 50 mL IVPB  1 g Intravenous Q24H Lynelle Doctor, RPH   Stopped at 05/07/17 1354  . [START ON 05/08/2017] Chlorhexidine Gluconate Cloth 2 % PADS 6 each  6 each Topical Q0600 Regalado, Belkys A, MD      . dextrose 5 %-0.9 % sodium chloride infusion   Intravenous Continuous Annita Brod, MD 50 mL/hr at 05/07/17 1214    . furosemide (LASIX) injection 20 mg  20 mg Intravenous Once Regalado, Belkys A, MD      . Derrill Memo ON 05/08/2017] furosemide (LASIX) injection 40 mg  40 mg Intravenous Daily Regalado, Belkys A, MD      . insulin aspart (novoLOG) injection 0-5 Units  0-5 Units Subcutaneous QHS Gevena Barre K, MD      . insulin aspart (novoLOG) injection 0-9 Units  0-9 Units Subcutaneous TID WC Annita Brod, MD   2 Units at 05/07/17 1239  . iron polysaccharides (NIFEREX) capsule 150 mg  150 mg Oral Daily Annita Brod, MD   150 mg at 05/07/17 1019  . metroNIDAZOLE (FLAGYL) IVPB 500 mg  500 mg Intravenous Q8H Regalado, Belkys A, MD   Stopped at 05/07/17 1527  . multivitamin with minerals tablet 1 tablet  1 tablet Oral Daily Annita Brod,  MD   1 tablet at 05/07/17 1019  . mupirocin  ointment (BACTROBAN) 2 % 1 application  1 application Nasal BID Regalado, Belkys A, MD   1 application at 76/72/09 1525  . ondansetron (ZOFRAN) tablet 4 mg  4 mg Oral Q6H PRN Annita Brod, MD       Or  . ondansetron [facility]) injection 4 mg  4 mg Intravenous Q6H PRN Annita Brod, MD      . pantoprazole (PROTONIX) EC tablet 40 mg  40 mg Oral Daily Annita Brod, MD   40 mg at 05/07/17 1019  . polyethylene glycol (MIRALAX / GLYCOLAX) packet 17 g  17 g Oral Daily PRN Annita Brod, MD      . pregabalin (LYRICA) capsule 75 mg  75 mg Oral BID Annita Brod, MD   75 mg at 05/07/17 1019  . saccharomyces boulardii (FLORASTOR) capsule 250 mg  250 mg Oral BID Annita Brod, MD   250 mg at 05/07/17 1019  . tamsulosin (FLOMAX) capsule 0.4 mg  0.4 mg Oral QPC supper Annita Brod, MD   0.4 mg at 05/06/17 1724    Allergies as of 05/06/2017  . (No Known Allergies)    Family History  Problem Relation Age of Onset  . Heart disease Mother   . Hypertension Mother   . Diabetes Mother   . Diabetes Father   . Ovarian cancer Sister 20       died in her 33's    Social History   Social History  . Marital status: Married    Spouse name: N/A  . Number of children: N/A  . Years of education: N/A   Occupational History  . Not on file.   Social History Main Topics  . Smoking status: Never Smoker  . Smokeless tobacco: Never Used  . Alcohol use No  . Drug use: No  . Sexual activity: Not Currently   Other Topics Concern  . Not on file   Social History Narrative  . No narrative on file    Review of Systems: As per HPI, all others negative  Physical Exam: Vital signs in last 24 hours: Temp:  [97.5 F (36.4 C)-98.9 F (37.2 C)] 97.5 F (36.4 C) (09/13 1536) Pulse Rate:  [74-100] 74 (09/13 1536) Resp:  [16] 16 (09/13 0431) BP: (94-99)/(44-57) 94/44 (09/13 1536) SpO2:  [97 %-99 %] 98 % (09/13 1536) Last BM Date: 05/06/17 General:   Alert, overweight,  Well-developed, well-nourished, pleasant and cooperative in NAD Head:  Normocephalic and atraumatic. Eyes:  Sclera clear, no icterus.   Conjunctiva pink. Ears:  Normal auditory acuity. Nose:  No deformity, discharge,  or lesions. Mouth:  No deformity or lesions.  Oropharynx pink & moist. Neck:  Supple; no masses or thyromegaly. Abdomen:  Soft, protuberant, nontender and nondistended. No masses, hepatosplenomegaly or hernias noted. Normal bowel sounds, without guarding, and without rebound.     Msk:  Symmetrical without gross deformities. Normal posture. Pulses:  Normal pulses noted. Extremities:  Without clubbing or edema. Neurologic:  Alert and  oriented x4; diffusely weak, otherwise grossly normal neurologically. Skin:  Intact without significant lesions or rashes. Psych:  Alert and cooperative. Normal mood and affect.   Lab Results:  Recent Labs  05/06/17 1343 05/07/17 0448  WBC 2.8* 2.7*  HGB 7.4* 6.3*  HCT 22.0* 18.9*  PLT 172 145*   BMET  Recent Labs  05/05/17 1022 05/06/17 1127 05/07/17 0448  NA 144 143 145  K 3.5 3.8 3.7  CL 115* 111 115*  CO2 '23 23 24  '$ GLUCOSE 89 48* 101*  BUN '15 19 17  '$ CREATININE 1.31* 1.24 1.27*  CALCIUM 8.0* 7.9* 7.5*   LFT  Recent Labs  05/06/17 1127  PROT 6.3*  ALBUMIN 2.7*  AST 18  ALT 10*  ALKPHOS 41  BILITOT 0.5   PT/INR  Recent Labs  05/06/17 1152  LABPROT 24.9*  INR 2.28    Studies/Results: Dg Chest 2 View  Result Date: 05/06/2017 CLINICAL DATA:  History of pancreatic cancer and hypoglycemia EXAM: CHEST  2 VIEW COMPARISON:  01/07/2017 FINDINGS: Cardiac shadow is mildly enlarged. A defibrillator is again seen and stable. The lungs are well aerated bilaterally. No focal infiltrate is noted. Small bilateral pleural effusions are seen. No significant pulmonary edema is noted. No bony abnormality is seen. IMPRESSION: Small bilateral pleural effusions. No other focal abnormality is seen. Electronically Signed   By: Inez Catalina M.D.   On: 05/06/2017 11:57   Ct Head Wo Contrast  Result Date: 05/06/2017 CLINICAL DATA:  [place] last night from a chair striking forehead on edge of sink, frontal abrasion, history dementia, CHF, pancreatic cancer, coronary artery disease, type II diabetes mellitus EXAM: CT HEAD WITHOUT CONTRAST TECHNIQUE: Contiguous axial images were obtained from the base of the skull through the vertex without intravenous contrast. Sagittal and coronal MPR images reconstructed from axial data set. COMPARISON:  04/30/2016 FINDINGS: Brain: Generalized atrophy. Normal ventricular morphology. No midline shift or mass effect. Small vessel chronic ischemic changes of deep cerebral white matter. 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 No intracranial hemorrhage, mass lesion or evidence of acute infarction. No extra-axial fluid collections. Vascular: Atherosclerotic calcification of internal carotid and vertebral arteries at skullbase Skull: Intact. Mild supraorbital scalp contusion/soft tissue swelling. Sinuses/Orbits: Intraorbital soft tissue planes clear. Bony orbits intact. Other: N/A IMPRESSION: Atrophy with small vessel chronic ischemic changes of deep cerebral white matter. Tiny old lacunar infarcts LEFT external capsule. No acute intracranial abnormalities. Electronically Signed   By: Lavonia Dana M.D.   On: 05/06/2017 11:53   Dg Abd Portable 1v  Result Date: 05/06/2017 CLINICAL DATA:  Episodes of hypoglycemia. EXAM: PORTABLE ABDOMEN - 1 VIEW COMPARISON:  None. FINDINGS: The bowel gas pattern is normal. No radio-opaque calculi or other significant radiographic abnormality are seen. Left hip replacement is noted. IMPRESSION: No bowel obstruction. Electronically Signed   By: Abelardo Diesel M.D.   On: 05/06/2017 21:08   Assessment:  1.  Metastatic pancreatic cancer. 2.  Diarrhea. 3.  Anemia.  No overt blood loss.   4.  Chronic anticoagulation, rivaroxaban.  Plan:  1.  PPI. 2.  Hold rivaroxaban. 3.   Hemoccult stools. 4.  Stool studies pending. 5.  Follow CBC; transfuse as needed. 6.  Consider EGD Saturday, pending clinical course, after patient has been off rivaroxaban for a couple days.   LOS: 1 day   Karia Ehresman M  05/07/2017, 4:22 PM  Cell [phone] If no answer or after 5 PM call [phone]

## 2017-05-07 NOTE — Progress Notes (Signed)
Inpatient Diabetes Program Recommendations  AACE/ADA: New Consensus Statement on Inpatient Glycemic Control (2015)  Target Ranges:  Prepandial:   less than 140 mg/dL      Peak postprandial:   less than 180 mg/dL (1-2 hours)      Critically ill patients:  140 - 180 mg/dL   Lab Results  Component Value Date   GLUCAP 95 05/07/2017   HGBA1C 8.0 (H) 05/06/2017    Review of Glycemic Control  Inpatient Diabetes Program Recommendations:  Spoke with wife and [name] [name] @ bedside to discuss hypoglycemia. Wife states that [name] [name]'s insulin regimen has recently been changed by Dr. Inda Merlin to Toujeo 25 units bid Levemir 3 units bid Wife states [name] [name] no longer takes Humalog for meal coverage. Left message @ Dr. Inda Merlin office for return call to clarify outpatient orders of insulin. Wife states [name] [name] has gone as low as 49 on current regimen. Reviewed hypoglycemia protocol with wife and explained both Toujeo and Levemir are long acting insulin.[name] [name] has not had much appetite recently while on chemotherapy and has approx. 2.5 weeks of chemo left. Will follow. Will need insulin adjustment prior to D/C home.  Thank you, Nani Gasser. Sulay Brymer, RN, MSN, CDE  Diabetes Coordinator Inpatient Glycemic Control Team Team Pager [phone] (8am-5pm) 05/07/2017 1:45 PM

## 2017-05-07 NOTE — Progress Notes (Signed)
Spoke with [name] regarding this patient as Dr. Paulita Fujita did a recent EUS. They will see patient in consult.  Ellouise Newer, PA-C [facility]

## 2017-05-07 NOTE — Progress Notes (Signed)
PROGRESS NOTE    [name] [name]  NUU:[id number] DOB: 1946-02-21 DOA: 05/06/2017 PCP: Josetta Huddle, MD    Brief Narrative: [name] [name] is a 71 y.o. male with medical history significant for metastatic pancreatic cancer, chronic systolic/diastolic congestive heart failure, stage III chronic kidney disease and diabetes mellitus who was brought to the emergency room with complaints of hypoglycemia. Patient's wife who cares for him at home noted that in the last week, she started noticing that he had both elevated as well as big drops in his blood sugars, sometimes ranging as low as the 50s. His mentation did not change, but for the past day or so, he's been having nausea and vomiting. She states his bowel movements have not changed although in the last few days, he felt like his abdomen became much more distended. Patient became weaker and last night, became so weak that he had a near syncopal event scraping his forehead against the bathroom while walking was on the toilet. Patient was brought into the emergency room today via EMS  ED Course: In the emergency room, patient was found to have a large UTI. He was also found to have episodes of hypoglycemia. He was given amps of D50, but still CBG stable persistently in the 50s. Chest x-ray noted mild bilateral pleural effusions and a BNP was mildly elevated in the 500s. Patient's hemoglobin was at 7.4, stable from several days ago, white count 2.8 and rest of labs were unremarkable. Hospitalist call for further evaluation and admission    Assessment & Plan:   Principal Problem:   Hypoglycemia Active Problems:   NICM- EF 20-25% echo 6/14   Chronic combined systolic (congestive) and diastolic (congestive) heart failure (HCC)   HTN (hypertension)   BiV ICD (BS).  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   2-Hypoglycemia; hold tujeo and levemir for now.  Monitor on D 5.   Acute on chronic combine systolic HF; Chest x ray with edema.  Received IV lasix. I will change lasix to daily due to diarrhea and soft BP.  Hold entresto  Echocardiogram done 10/14/16 noted EF of 20-25 percent plus grade 2 diastolic dysfunction and mild to moderate mitral regurg  Diarrhea;  Will check for C diff due to leukopenia, mild fever.  I will add flagyl to cover for colitis.   Pancytopenia;  Related to chemo.  Dr Ammie Dalton will see patient.   PAF; hold xarelto due to anemia.   Anemia; acute blood loss. patient to received 2 units of PRBC>  GI consulted.  Discussed with Dr Benay Spice , he doesn't think that low hb is related to chemo. He is recommending GI evaluation.   Dementia: Mild, stable, no evidence of acute behavioral disturbance  Stage 3 chronic kidney disease: Creatinine at 1.24. At baseline  Acute lower UTI: Awaiting urine cultures. IV Rocephin. Follow Blood culture.   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 on chemo and radiation.  Resume pancreatic enzymes   DVT prophylaxis: hold xarelto due to low hemoglobin.  Code Status: full code.  Family Communication: wife at bedside.  Disposition Plan: remain inpatient.   Consultants:  GI  Dr Benay Spice    Procedures: none  Antimicrobials: ceftriaxone Flagyl   Subjective: He is complaining of diarrhea, started last night. He has has 3 watery Bowel movement since yesterday   Objective: Vitals:   05/06/17 1534 05/06/17 1603 05/06/17 2125 05/07/17 0431  BP: 107/68 122/62 (!) 99/51 (!) 99/57  Pulse: (!) 109 (!) 108 100 90  Resp: 13 17 16 16   Temp:  (!) 100.4 F (38 C) 98.9 F (37.2 C) 98.6 F (37 C)  TempSrc:  Oral Oral Oral  SpO2:  100% 97% 99% 97%  Weight:  85.7 kg (188 lb 15 oz)    Height:  5\' 9"  (1.753 m)      Intake/Output Summary (Last 24 hours) at 05/07/17 1206 Last data filed at 05/07/17 0200  Gross per 24 hour  Intake           688.34 ml  Output              625 ml  Net            63.34 ml   Filed Weights   05/06/17 1047 05/06/17 1603  Weight: 80.7 kg (178 lb) 85.7 kg (188 lb 15 oz)    Examination:  General exam: Appears calm and comfortable  Respiratory system: Clear to auscultation. Respiratory effort normal. Cardiovascular system: S1 & S2 heard, RRR. No JVD, murmurs, rubs, gallops or clicks. No pedal edema. Gastrointestinal system: Abdomen is distended, soft and mild tender. No organomegaly or masses felt. Normal bowel sounds heard. Central nervous system: Alert and oriented. No focal neurological deficits. Extremities: Bilateral BKA Skin: No rashes, lesions or ulcers Psychiatry: Judgement and insight appear normal. Mood & affect appropriate.     Data Reviewed: I have personally reviewed following labs and imaging studies  CBC:  Recent Labs Lab 05/04/17 1108 05/06/17 1343 05/07/17 0448  WBC 4.5 2.8* 2.7*  NEUTROABS 3.8 2.3  --   HGB 7.5* 7.4* 6.3*  HCT 22.8* 22.0* 18.9*  MCV 75.8* 73.3* 73.3*  PLT 182 172 532*   Basic Metabolic Panel:  Recent Labs Lab 05/05/17 1022 05/06/17 1127 05/07/17 0448  NA 144 143 145  K 3.5 3.8 3.7  CL 115* 111 115*  CO2 23 23 24   GLUCOSE 89 48* 101*  BUN 15 19 17   CREATININE 1.31* 1.24 1.27*  CALCIUM 8.0* 7.9* 7.5*   GFR: Estimated Creatinine Clearance: 56.2 mL/min (A) (by C-G formula based on SCr of 1.27 mg/dL (H)). Liver Function Tests:  Recent Labs Lab 05/06/17 1127  AST 18  ALT 10*  ALKPHOS 41  BILITOT 0.5  PROT 6.3*  ALBUMIN 2.7*    Recent Labs Lab 05/06/17 1127  LIPASE 13   No results for input(s): AMMONIA in the last 168 hours. Coagulation Profile:  Recent Labs Lab 05/06/17 1152  INR 2.28   Cardiac Enzymes: No  results for input(s): CKTOTAL, CKMB, CKMBINDEX, TROPONINI in the last 168 hours. BNP (last 3 results) No results for input(s): PROBNP in the last 8760 hours. HbA1C:  Recent Labs  05/06/17 1603  HGBA1C 8.0*   CBG:  Recent Labs Lab 05/06/17 1918 05/06/17 2122 05/07/17 0024 05/07/17 0426 05/07/17 0823  GLUCAP 108* 118* 118* 105* 95   Lipid Profile: No results for input(s): CHOL, HDL, LDLCALC, TRIG, CHOLHDL, LDLDIRECT in the last 72 hours. Thyroid Function Tests: No results for input(s): TSH, T4TOTAL, FREET4, T3FREE, THYROIDAB in the last 72 hours. Anemia Panel: No results for input(s): VITAMINB12, FOLATE, FERRITIN, TIBC, IRON, RETICCTPCT  in the last 72 hours. Sepsis Labs:  Recent Labs Lab 05/06/17 1159  LATICACIDVEN 1.11    Recent Results (from the past 240 hour(s))  Urine culture     Status: Abnormal (Preliminary result)   Collection Time: 05/06/17 11:27 AM  Result Value Ref Range Status   Specimen Description URINE, CLEAN CATCH  Final   Special Requests Immunocompromised  Final   Culture >=100,000 COLONIES/mL GRAM NEGATIVE RODS (A)  Final   Report Status PENDING  Incomplete  Culture, blood (routine x 2)     Status: None (Preliminary result)   Collection Time: 05/06/17 12:00 PM  Result Value Ref Range Status   Specimen Description BLOOD LEFT ANTECUBITAL  Final   Special Requests   Final    BOTTLES DRAWN AEROBIC AND ANAEROBIC Blood Culture adequate volume   Culture   Final    NO GROWTH < 24 HOURS Performed at [facility], [place] [address] [place], [place] [address]    Report Status PENDING  Incomplete  Culture, blood (routine x 2)     Status: None (Preliminary result)   Collection Time: 05/06/17  1:00 PM  Result Value Ref Range Status   Specimen Description RIGHT ANTECUBITAL  Final   Special Requests   Final    BOTTLES DRAWN AEROBIC AND ANAEROBIC Blood Culture adequate volume   Culture   Final    NO GROWTH < 24 HOURS Performed at [facility], [place] [address], [place], [place] [address]    Report Status PENDING  Incomplete  MRSA PCR Screening     Status: Abnormal   Collection Time: 05/06/17  4:17 PM  Result Value Ref Range Status   MRSA by PCR POSITIVE (A) NEGATIVE Final    Comment:        The GeneXpert MRSA Assay (FDA approved for NASAL specimens only), is one component of a comprehensive MRSA colonization surveillance program. It is not intended to diagnose MRSA infection nor to guide or monitor treatment for MRSA infections. RESULT CALLED TO, READ BACK BY AND VERIFIED WITH: CORCARAN,R RN 9.12.18 @2133  ZANDO,C          Radiology Studies: Dg Chest 2 View  Result Date: 05/06/2017 CLINICAL DATA:  History of pancreatic cancer and hypoglycemia EXAM: CHEST  2 VIEW COMPARISON:  01/07/2017 FINDINGS: Cardiac shadow is mildly enlarged. A defibrillator is again seen and stable. The lungs are well aerated bilaterally. No focal infiltrate is noted. Small bilateral pleural effusions are seen. No significant pulmonary edema is noted. No bony abnormality is seen. IMPRESSION: Small bilateral pleural effusions. No other focal abnormality is seen. Electronically Signed   By: Inez Catalina M.D.   On: 05/06/2017 11:57   Ct Head Wo Contrast  Result Date: 05/06/2017 CLINICAL DATA:  [place] last night from a chair striking forehead on edge of sink, frontal abrasion, history dementia, CHF, pancreatic cancer, coronary artery disease, type II diabetes mellitus EXAM: CT HEAD WITHOUT CONTRAST TECHNIQUE: Contiguous axial images were obtained from the base of the skull through the vertex without intravenous contrast. Sagittal and coronal MPR images reconstructed from axial data set. COMPARISON:  04/30/2016 FINDINGS: Brain: Generalized atrophy. Normal ventricular morphology. No midline shift or mass effect. Small vessel chronic ischemic changes of deep cerebral white matter. 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 No intracranial hemorrhage, mass  lesion or evidence of acute infarction. No extra-axial fluid collections. Vascular: Atherosclerotic calcification of internal carotid and vertebral arteries at skullbase Skull: Intact. Mild supraorbital scalp contusion/soft tissue  swelling. Sinuses/Orbits: Intraorbital soft tissue planes clear. Bony orbits intact. Other: N/A IMPRESSION: Atrophy with small vessel chronic ischemic changes of deep cerebral white matter. Tiny old lacunar infarcts LEFT external capsule. No acute intracranial abnormalities. Electronically Signed   By: Lavonia Dana M.D.   On: 05/06/2017 11:53   Dg Abd Portable 1v  Result Date: 05/06/2017 CLINICAL DATA:  Episodes of hypoglycemia. EXAM: PORTABLE ABDOMEN - 1 VIEW COMPARISON:  None. FINDINGS: The bowel gas pattern is normal. No radio-opaque calculi or other significant radiographic abnormality are seen. Left hip replacement is noted. IMPRESSION: No bowel obstruction. Electronically Signed   By: Abelardo Diesel M.D.   On: 05/06/2017 21:08        Scheduled Meds: . carvedilol  6.25 mg Oral BID WC  . [START ON 05/08/2017] furosemide  40 mg Intravenous Daily  . insulin aspart  0-5 Units Subcutaneous QHS  . insulin aspart  0-9 Units Subcutaneous TID WC  . iron polysaccharides  150 mg Oral Daily  . multivitamin with minerals  1 tablet Oral Daily  . pantoprazole  40 mg Oral Daily  . pregabalin  75 mg Oral BID  . rivaroxaban  20 mg Oral Q supper  . saccharomyces boulardii  250 mg Oral BID  . sacubitril-valsartan  1 tablet Oral BID  . tamsulosin  0.4 mg Oral QPC supper   Continuous Infusions: . sodium chloride    . cefTRIAXone (ROCEPHIN)  IV    . dextrose 5 % and 0.9% NaCl 50 mL/hr at 05/06/17 1728     LOS: 1 day    Time spent: 35 minutes,     Hardy Harcum, Cassie Freer, MD [facility] Pager [phone]  If 7PM-7AM, please contact night-coverage [email or web address] Password [facility] 05/07/2017, 12:06 PM

## 2017-05-07 NOTE — Progress Notes (Signed)
[facility] Therapy Treatment Record Phone [phone]   Radiation Therapy was administered to [name] [name] on: 05/07/2017  11:41 AM and was treatment # 16 out of a planned course of 28 treatments.  Radiation Treatment  1). Beam photons with 6-10 energy and Photons 6-10 MeV  2). Brachytherapy None  3). Stereotactic Radiosurgery None  4). Other Radiation None     [name] [name], RT (T)

## 2017-05-08 ENCOUNTER — Ambulatory Visit
Admission: RE | Admit: 2017-05-08 | Discharge: 2017-05-08 | Disposition: A | Discharge status: Home / Self Care | Payer: Medicare Other | Source: Ambulatory Visit | Attending: Radiation Oncology | Admitting: Radiation Oncology

## 2017-05-08 DIAGNOSIS — K922 Gastrointestinal hemorrhage, unspecified: Secondary | ICD-10-CM

## 2017-05-08 DIAGNOSIS — N390 Urinary tract infection, site not specified: Secondary | ICD-10-CM

## 2017-05-08 DIAGNOSIS — I4891 Unspecified atrial fibrillation: Secondary | ICD-10-CM

## 2017-05-08 DIAGNOSIS — B9561 Methicillin susceptible Staphylococcus aureus infection as the cause of diseases classified elsewhere: Secondary | ICD-10-CM

## 2017-05-08 DIAGNOSIS — K6289 Other specified diseases of anus and rectum: Secondary | ICD-10-CM

## 2017-05-08 DIAGNOSIS — N189 Chronic kidney disease, unspecified: Secondary | ICD-10-CM

## 2017-05-08 DIAGNOSIS — E119 Type 2 diabetes mellitus without complications: Secondary | ICD-10-CM

## 2017-05-08 DIAGNOSIS — D500 Iron deficiency anemia secondary to blood loss (chronic): Secondary | ICD-10-CM

## 2017-05-08 DIAGNOSIS — R55 Syncope and collapse: Secondary | ICD-10-CM

## 2017-05-08 DIAGNOSIS — C251 Malignant neoplasm of body of pancreas: Secondary | ICD-10-CM

## 2017-05-08 DIAGNOSIS — R197 Diarrhea, unspecified: Secondary | ICD-10-CM

## 2017-05-08 DIAGNOSIS — E162 Hypoglycemia, unspecified: Secondary | ICD-10-CM

## 2017-05-08 DIAGNOSIS — R112 Nausea with vomiting, unspecified: Secondary | ICD-10-CM

## 2017-05-08 LAB — CBC
HEMATOCRIT: 28.3 % — AB (ref 39.0–52.0)
Hemoglobin: 9.8 g/dL — ABNORMAL LOW (ref 13.0–17.0)
MCH: 26.8 pg (ref 26.0–34.0)
MCHC: 34.6 g/dL (ref 30.0–36.0)
MCV: 77.3 fL — AB (ref 78.0–100.0)
PLATELETS: 143 10*3/uL — AB (ref 150–400)
RBC: 3.66 MIL/uL — ABNORMAL LOW (ref 4.22–5.81)
RDW: 24.5 % — AB (ref 11.5–15.5)
WBC: 2.3 10*3/uL — AB (ref 4.0–10.5)

## 2017-05-08 LAB — C DIFFICILE QUICK SCREEN W PCR REFLEX
C DIFFICILE (CDIFF) INTERP: NOT DETECTED
C DIFFICILE (CDIFF) TOXIN: NEGATIVE
C Diff antigen: NEGATIVE

## 2017-05-08 LAB — URINE CULTURE: Culture: >=100000 — AB

## 2017-05-08 LAB — BASIC METABOLIC PANEL
Anion gap: 8 (ref 5–15)
BUN: 14 mg/dL (ref 6–20)
CALCIUM: 7.6 mg/dL — AB (ref 8.9–10.3)
CO2: 23 mmol/L (ref 22–32)
CREATININE: 1.22 mg/dL (ref 0.61–1.24)
Chloride: 112 mmol/L — ABNORMAL HIGH (ref 101–111)
GFR calc Af Amer: >60 mL/min (ref >60)
GFR, EST NON AFRICAN AMERICAN: 57 mL/min — AB (ref >60)
GLUCOSE: 240 mg/dL — AB (ref 65–99)
Potassium: 3.3 mmol/L — ABNORMAL LOW (ref 3.5–5.1)
Sodium: 143 mmol/L (ref 135–145)

## 2017-05-08 LAB — BLOOD CULTURE ID PANEL (REFLEXED)
ACINETOBACTER BAUMANNII: NOT DETECTED
CANDIDA GLABRATA: NOT DETECTED
CANDIDA TROPICALIS: NOT DETECTED
Candida albicans: NOT DETECTED
Candida krusei: NOT DETECTED
Candida parapsilosis: NOT DETECTED
Carbapenem resistance: NOT DETECTED
ENTEROBACTER CLOACAE COMPLEX: NOT DETECTED
ESCHERICHIA COLI: NOT DETECTED
Enterobacteriaceae species: NOT DETECTED
Enterococcus species: NOT DETECTED
HAEMOPHILUS INFLUENZAE: NOT DETECTED
Klebsiella oxytoca: NOT DETECTED
Klebsiella pneumoniae: NOT DETECTED
LISTERIA MONOCYTOGENES: NOT DETECTED
METHICILLIN RESISTANCE: NOT DETECTED
NEISSERIA MENINGITIDIS: NOT DETECTED
PROTEUS SPECIES: NOT DETECTED
Pseudomonas aeruginosa: NOT DETECTED
SERRATIA MARCESCENS: NOT DETECTED
STAPHYLOCOCCUS AUREUS BCID: NOT DETECTED
STREPTOCOCCUS SPECIES: NOT DETECTED
Staphylococcus species: NOT DETECTED
Streptococcus agalactiae: NOT DETECTED
Streptococcus pneumoniae: NOT DETECTED
Streptococcus pyogenes: NOT DETECTED
VANCOMYCIN RESISTANCE: NOT DETECTED

## 2017-05-08 LAB — GASTROINTESTINAL PANEL BY PCR, STOOL (REPLACES STOOL CULTURE)
Adenovirus F40/41: NOT DETECTED
Astrovirus: NOT DETECTED
CAMPYLOBACTER SPECIES: NOT DETECTED
CRYPTOSPORIDIUM: NOT DETECTED
Cyclospora cayetanensis: NOT DETECTED
ENTEROPATHOGENIC E COLI (EPEC): NOT DETECTED
Entamoeba histolytica: NOT DETECTED
Enteroaggregative E coli (EAEC): NOT DETECTED
Enterotoxigenic E coli (ETEC): NOT DETECTED
Giardia lamblia: NOT DETECTED
Norovirus GI/GII: NOT DETECTED
PLESIMONAS SHIGELLOIDES: NOT DETECTED
ROTAVIRUS A: NOT DETECTED
SALMONELLA SPECIES: NOT DETECTED
SAPOVIRUS (I, II, IV, AND V): NOT DETECTED
SHIGA LIKE TOXIN PRODUCING E COLI (STEC): NOT DETECTED
SHIGELLA/ENTEROINVASIVE E COLI (EIEC): NOT DETECTED
Vibrio cholerae: NOT DETECTED
Vibrio species: NOT DETECTED
YERSINIA ENTEROCOLITICA: NOT DETECTED

## 2017-05-08 LAB — GLUCOSE, CAPILLARY
GLUCOSE-CAPILLARY: 158 mg/dL — AB (ref 65–99)
GLUCOSE-CAPILLARY: 226 mg/dL — AB (ref 65–99)
GLUCOSE-CAPILLARY: 234 mg/dL — AB (ref 65–99)
Glucose-Capillary: 139 mg/dL — ABNORMAL HIGH (ref 65–99)
Glucose-Capillary: 144 mg/dL — ABNORMAL HIGH (ref 65–99)
Glucose-Capillary: 248 mg/dL — ABNORMAL HIGH (ref 65–99)

## 2017-05-08 LAB — OCCULT BLOOD X 1 CARD TO LAB, STOOL: FECAL OCCULT BLD: POSITIVE — AB

## 2017-05-08 MED ORDER — POTASSIUM CHLORIDE CRYS ER 20 MEQ PO TBCR
40.0000 meq | EXTENDED_RELEASE_TABLET | Freq: Once | ORAL | Status: AC
  Administered 2017-05-08: 40 meq via ORAL
  Filled 2017-05-08: qty 2

## 2017-05-08 MED ORDER — SODIUM CHLORIDE 0.9 % IV SOLN
INTRAVENOUS | Status: DC
  Administered 2017-05-08 – 2017-05-09 (×2): via INTRAVENOUS

## 2017-05-08 MED ORDER — POTASSIUM CHLORIDE CRYS ER 20 MEQ PO TBCR
20.0000 meq | EXTENDED_RELEASE_TABLET | Freq: Once | ORAL | Status: AC
  Administered 2017-05-08: 20 meq via ORAL
  Filled 2017-05-08: qty 1

## 2017-05-08 NOTE — Progress Notes (Signed)
9 beats run of VT. Pt is asymptomatic. Donnal Debar, NP was notified

## 2017-05-08 NOTE — Progress Notes (Signed)
PROGRESS NOTE    XZAVIAN SEMMEL  IHK:[id number] DOB: 09-21-1945 DOA: 05/06/2017 PCP: Josetta Huddle, MD    Brief Narrative: [name] [name] is a 71 y.o. male with medical history significant for metastatic pancreatic cancer, chronic systolic/diastolic congestive heart failure, stage III chronic kidney disease and diabetes mellitus who was brought to the emergency room with complaints of hypoglycemia. Patient's wife who cares for him at home noted that in the last week, she started noticing that he had both elevated as well as big drops in his blood sugars, sometimes ranging as low as the 50s. His mentation did not change, but for the past day or so, he's been having nausea and vomiting. She states his bowel movements have not changed although in the last few days, he felt like his abdomen became much more distended. Patient became weaker and last night, became so weak that he had a near syncopal event scraping his forehead against the bathroom while walking was on the toilet. Patient was brought into the emergency room today via EMS  ED Course: In the emergency room, patient was found to have a large UTI. He was also found to have episodes of hypoglycemia. He was given amps of D50, but still CBG stable persistently in the 50s. Chest x-ray noted mild bilateral pleural effusions and a BNP was mildly elevated in the 500s. Patient's hemoglobin was at 7.4, stable from several days ago, white count 2.8 and rest of labs were unremarkable. Hospitalist call for further evaluation and admission    Assessment & Plan:   Principal Problem:   Hypoglycemia Active Problems:   NICM- EF 20-25% echo 6/14   Chronic combined systolic (congestive) and diastolic (congestive) heart failure (HCC)   HTN (hypertension)   BiV ICD (BS).  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 Resolved.   2-Hypoglycemia; hold tujeo.  Resolved. Will stop D 5 and monitor CBG>   Acute on chronic combine systolic HF; Chest x ray with edema.  Received IV lasix. I will change lasix to daily due to diarrhea and soft BP.  Hold entresto  Echocardiogram done 10/14/16 noted EF of 20-25 percent plus grade 2 diastolic dysfunction and mild to moderate mitral regurg Continue with IV lasix.   Diarrhea;  On flagyl. Await GI pathogen.  C diff negative   Hypokalemia; replete.   Pancytopenia;  Related to chemo.  Dr Ammie Dalton will see patient.   PAF; hold xarelto due to anemia.   Anemia; acute blood loss. patient to received 2 units of PRBC>  GI consulted.  Discussed with Dr Benay Spice , he doesn't think that low hb is related to chemo. He is recommending GI evaluation.  GI planning endoscopy at some point.  Hb stable.   Dementia: Mild, stable, no evidence of acute behavioral disturbance  Stage 3 chronic kidney disease: Creatinine at 1.24. At baseline  Acute lower UTI: IV Rocephin.  Blood culture. One of two positive for gram positive rods. Follow result.s  Urine culture grew Klebsiella.   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 on chemo and radiation.  Resume pancreatic enzymes Abdominal distension; check US>   DVT prophylaxis:  hold xarelto due to low hemoglobin.  Code Status: full code.  Family Communication: wife at bedside.  Disposition Plan: remain inpatient.   Consultants:   GI  Dr Benay Spice    Procedures: none  Antimicrobials: ceftriaxone Flagyl   Subjective: Complaining of diarrhea.   Objective: Vitals:   05/08/17 0119 05/08/17 0130 05/08/17 0402 05/08/17 1323  BP: (!) 114/57 (!) 120/57 109/66 123/64  Pulse: 92 90  92  Resp: 18 16 16 18   Temp: 99.3 F (37.4  C) 98.6 F (37 C) 98.5 F (36.9 C) 97.9 F (36.6 C)  TempSrc: Oral Oral Oral Oral  SpO2: 100% 99% 99% 100%  Weight:      Height:        Intake/Output Summary (Last 24 hours) at 05/08/17 1357 Last data filed at 05/08/17 0600  Gross per 24 hour  Intake             2384 ml  Output              950 ml  Net             1434 ml   Filed Weights   05/06/17 1047 05/06/17 1603  Weight: 80.7 kg (178 lb) 85.7 kg (188 lb 15 oz)    Examination:  General exam: NAD Respiratory system: CTA Cardiovascular system: S 1 S 2 RRR Gastrointestinal system: Soft, distended, no rigidity  Central nervous system: alert non focal.  Extremities: Bilateral BKA Skin: No rashes, lesions or ulcers   Data Reviewed: I have personally reviewed following labs and imaging studies  CBC:  Recent Labs Lab 05/04/17 1108 05/06/17 1343 05/07/17 0448 05/08/17 0821  WBC 4.5 2.8* 2.7* 2.3*  NEUTROABS 3.8 2.3  --   --   HGB 7.5* 7.4* 6.3* 9.8*  HCT 22.8* 22.0* 18.9* 28.3*  MCV 75.8* 73.3* 73.3* 77.3*  PLT 182 172 145* 998*   Basic Metabolic Panel:  Recent Labs Lab 05/05/17 1022 05/06/17 1127 05/07/17 0448 05/08/17 0821  NA 144 143 145 143  K 3.5 3.8 3.7 3.3*  CL 115* 111 115* 112*  CO2 23 23 24 23   GLUCOSE 89 48* 101* 240*  BUN 15 19 17 14   CREATININE 1.31* 1.24 1.27* 1.22  CALCIUM 8.0* 7.9* 7.5* 7.6*   GFR: Estimated Creatinine Clearance: 58.5 mL/min (by C-G formula based on SCr of 1.22 mg/dL). Liver Function Tests:  Recent Labs Lab 05/06/17 1127  AST 18  ALT 10*  ALKPHOS 41  BILITOT 0.5  PROT 6.3*  ALBUMIN 2.7*    Recent Labs Lab 05/06/17 1127  LIPASE 13   No results for input(s): AMMONIA in the last 168 hours. Coagulation Profile:  Recent Labs Lab 05/06/17 1152  INR 2.28   Cardiac Enzymes: No results for input(s): CKTOTAL, CKMB, CKMBINDEX, TROPONINI in the last 168 hours. BNP (last 3 results) No results for input(s): PROBNP in the last 8760 hours. HbA1C:  Recent  Labs  05/06/17 1603  HGBA1C 8.0*   CBG:  Recent Labs Lab 05/07/17 1205 05/07/17 1728 05/07/17 1944 05/08/17 0840 05/08/17 1300  GLUCAP 139* 158* 144* 248* 226*   Lipid Profile: No results for input(s): CHOL, HDL, LDLCALC, TRIG, CHOLHDL, LDLDIRECT in the last 72 hours. Thyroid Function Tests: No results for input(s): TSH, T4TOTAL, FREET4, T3FREE, THYROIDAB in the last 72 hours. Anemia Panel: No results for input(s): VITAMINB12, FOLATE, FERRITIN, TIBC, IRON, RETICCTPCT in the last 72 hours. Sepsis Labs:  Recent Labs Lab 05/06/17 1159  LATICACIDVEN 1.11  Recent Results (from the past 240 hour(s))  Urine culture     Status: Abnormal   Collection Time: 05/06/17 11:27 AM  Result Value Ref Range Status   Specimen Description URINE, CLEAN CATCH  Final   Special Requests Immunocompromised  Final   Culture >=100,000 COLONIES/mL KLEBSIELLA PNEUMONIAE (A)  Final   Report Status 05/08/2017 FINAL  Final   Organism ID, Bacteria KLEBSIELLA PNEUMONIAE (A)  Final      Susceptibility   Klebsiella pneumoniae - MIC*    AMPICILLIN RESISTANT Resistant     CEFAZOLIN <=4 SENSITIVE Sensitive     CEFTRIAXONE <=1 SENSITIVE Sensitive     CIPROFLOXACIN <=0.25 SENSITIVE Sensitive     GENTAMICIN <=1 SENSITIVE Sensitive     IMIPENEM <=0.25 SENSITIVE Sensitive     NITROFURANTOIN 64 INTERMEDIATE Intermediate     TRIMETH/SULFA <=20 SENSITIVE Sensitive     AMPICILLIN/SULBACTAM <=2 SENSITIVE Sensitive     PIP/TAZO <=4 SENSITIVE Sensitive     Extended ESBL NEGATIVE Sensitive     * >=100,000 COLONIES/mL KLEBSIELLA PNEUMONIAE  Culture, blood (routine x 2)     Status: None (Preliminary result)   Collection Time: 05/06/17 12:00 PM  Result Value Ref Range Status   Specimen Description BLOOD LEFT ANTECUBITAL  Final   Special Requests   Final    BOTTLES DRAWN AEROBIC AND ANAEROBIC Blood Culture adequate volume   Culture  Setup Time   Final    GRAM POSITIVE RODS AEROBIC BOTTLE ONLY CRITICAL RESULT  CALLED TO, READ BACK BY AND VERIFIED WITH: J.GRIMSLEY PHARMD 05/08/17 0603 L.CHAMPION    Culture   Final    GRAM POSITIVE RODS CULTURE REINCUBATED FOR BETTER GROWTH Performed at [facility], [place] [address] [place], [place] [address]    Report Status PENDING  Incomplete  Blood Culture ID Panel (Reflexed)     Status: None   Collection Time: 05/06/17 12:00 PM  Result Value Ref Range Status   Enterococcus species NOT DETECTED NOT DETECTED Final   Vancomycin resistance NOT DETECTED NOT DETECTED Final   Listeria monocytogenes NOT DETECTED NOT DETECTED Final   Staphylococcus species NOT DETECTED NOT DETECTED Final   Staphylococcus aureus NOT DETECTED NOT DETECTED Final   Methicillin resistance NOT DETECTED NOT DETECTED Final   Streptococcus species NOT DETECTED NOT DETECTED Final   Streptococcus agalactiae NOT DETECTED NOT DETECTED Final   Streptococcus pneumoniae NOT DETECTED NOT DETECTED Final   Streptococcus pyogenes NOT DETECTED NOT DETECTED Final   Acinetobacter baumannii NOT DETECTED NOT DETECTED Final   Enterobacteriaceae species NOT DETECTED NOT DETECTED Final   Enterobacter cloacae complex NOT DETECTED NOT DETECTED Final   Escherichia coli NOT DETECTED NOT DETECTED Final   Klebsiella oxytoca NOT DETECTED NOT DETECTED Final   Klebsiella pneumoniae NOT DETECTED NOT DETECTED Final   Proteus species NOT DETECTED NOT DETECTED Final   Serratia marcescens NOT DETECTED NOT DETECTED Final   Carbapenem resistance NOT DETECTED NOT DETECTED Final   Haemophilus influenzae NOT DETECTED NOT DETECTED Final   Neisseria meningitidis NOT DETECTED NOT DETECTED Final   Pseudomonas aeruginosa NOT DETECTED NOT DETECTED Final   Candida albicans NOT DETECTED NOT DETECTED Final   Candida glabrata NOT DETECTED NOT DETECTED Final   Candida krusei NOT DETECTED NOT DETECTED Final   Candida parapsilosis NOT DETECTED NOT DETECTED Final   Candida tropicalis NOT DETECTED NOT DETECTED Final    Comment:  Performed at [facility], [place] [address], [place], [place] [address]  Culture, blood (routine x  2)     Status: None (Preliminary result)   Collection Time: 05/06/17  1:00 PM  Result Value Ref Range Status   Specimen Description RIGHT ANTECUBITAL  Final   Special Requests   Final    BOTTLES DRAWN AEROBIC AND ANAEROBIC Blood Culture adequate volume   Culture   Final    NO GROWTH 2 DAYS Performed at [facility], [address], [place], [place] [address]    Report Status PENDING  Incomplete  MRSA PCR Screening     Status: Abnormal   Collection Time: 05/06/17  4:17 PM  Result Value Ref Range Status   MRSA by PCR POSITIVE (A) NEGATIVE Final    Comment:        The GeneXpert MRSA Assay (FDA approved for NASAL specimens only), is one component of a comprehensive MRSA colonization surveillance program. It is not intended to diagnose MRSA infection nor to guide or monitor treatment for MRSA infections. RESULT CALLED TO, READ BACK BY AND VERIFIED WITH: CORCARAN,R RN 9.12.18 @2133  ZANDO,C   C difficile quick scan w PCR reflex     Status: None   Collection Time: 05/08/17  7:01 AM  Result Value Ref Range Status   C Diff antigen NEGATIVE NEGATIVE Final   C Diff toxin NEGATIVE NEGATIVE Final   C Diff interpretation No C. difficile detected.  Final         Radiology Studies: Dg Abd Portable 1v  Result Date: 05/06/2017 CLINICAL DATA:  Episodes of hypoglycemia. EXAM: PORTABLE ABDOMEN - 1 VIEW COMPARISON:  None. FINDINGS: The bowel gas pattern is normal. No radio-opaque calculi or other significant radiographic abnormality are seen. Left hip replacement is noted. IMPRESSION: No bowel obstruction. Electronically Signed   By: Abelardo Diesel M.D.   On: 05/06/2017 21:08        Scheduled Meds: . carvedilol  6.25 mg Oral BID WC  . Chlorhexidine Gluconate Cloth  6 each Topical Q0600  . furosemide  40 mg Intravenous Daily  . insulin aspart  0-5 Units Subcutaneous QHS  .  insulin aspart  0-9 Units Subcutaneous TID WC  . iron polysaccharides  150 mg Oral Daily  . multivitamin with minerals  1 tablet Oral Daily  . mupirocin ointment  1 application Nasal BID  . pantoprazole  40 mg Oral Daily  . potassium chloride  20 mEq Oral Once  . pregabalin  75 mg Oral BID  . saccharomyces boulardii  250 mg Oral BID  . tamsulosin  0.4 mg Oral QPC supper   Continuous Infusions: . sodium chloride    . sodium chloride 50 mL/hr at 05/08/17 1135  . cefTRIAXone (ROCEPHIN)  IV Stopped (05/08/17 1345)  . metronidazole 500 mg (05/08/17 1354)     LOS: 2 days    Time spent: 35 minutes,     Avory Rahimi, Cassie Freer, MD [facility] Pager [phone]  If 7PM-7AM, please contact night-coverage [email or web address] Password [email or web address] 05/08/2017, 1:57 PM

## 2017-05-08 NOTE — Progress Notes (Signed)
Subjective: Patient reports just having large brown bowel movement.  Objective: Vital signs in last 24 hours: Temp:  [97.5 F (36.4 C)-99.3 F (37.4 C)] 98.5 F (36.9 C) (09/14 0402) Pulse Rate:  [74-92] 90 (09/14 0130) Resp:  [16-18] 16 (09/14 0402) BP: (94-120)/(44-66) 109/66 (09/14 0402) SpO2:  [97 %-100 %] 99 % (09/14 0402) Weight change:  Last BM Date: 05/08/17  PE: GEN:  NAD, overweight  Lab Results: CBC    Component Value Date/Time   WBC 2.3 (L) 05/08/2017 0821   RBC 3.66 (L) 05/08/2017 0821   HGB 9.8 (L) 05/08/2017 0821   HGB 7.5 (L) 05/04/2017 1108   HCT 28.3 (L) 05/08/2017 0821   HCT 22.8 (L) 05/04/2017 1108   PLT 143 (L) 05/08/2017 0821   PLT 182 05/04/2017 1108   PLT 240 09/12/2016 1654   MCV 77.3 (L) 05/08/2017 0821   MCV 75.8 (L) 05/04/2017 1108   MCH 26.8 05/08/2017 0821   MCHC 34.6 05/08/2017 0821   RDW 24.5 (H) 05/08/2017 0821   RDW 26.3 (H) 05/04/2017 1108   LYMPHSABS 0.3 (L) 05/06/2017 1343   LYMPHSABS 0.1 (L) 05/04/2017 1108   MONOABS 0.1 05/06/2017 1343   MONOABS 0.3 05/04/2017 1108   EOSABS 0.1 05/06/2017 1343   EOSABS 0.3 05/04/2017 1108   EOSABS 0.1 09/12/2016 1654   BASOSABS 0.0 05/06/2017 1343   BASOSABS 0.0 05/04/2017 1108    Assessment:  1.  Metastatic pancreatic cancer. 2.  Diarrhea. 3.  Anemia.  No overt blood loss.   4.  Chronic anticoagulation, rivaroxaban.  Plan:  1.  PPI 2.  Follow Hgb trend. 3.  Consider endoscopy over weekend, if Hgb drops and/or overt bleeding ensues.  4.  Will follow.  Landry Dyke 05/08/2017, 12:14 PM   Cell [phone] If no answer or after 5 PM call [phone]

## 2017-05-08 NOTE — Progress Notes (Signed)
IP PROGRESS NOTE  Subjective:   [name] [name] is known to me with a history of pancreas cancer. He is being treated with concurrent Xeloda and radiation. He was admitted 05/06/2017 with presyncope and hypoglycemia. He had nausea and vomiting. He also reports diarrhea. He denies bleeding.  He was noted to have severe anemia.  He reports abdominal discomfort this morning. He has diarrhea up to 3 times per day. No mouth sores or hand pain.  He was transfused 2 units of packed red blood cells beginning yesterday.   Objective: Vital signs in last 24 hours: Blood pressure 109/66, pulse 90, temperature 98.5 F (36.9 C), temperature source Oral, resp. rate 16, height 5\' 9"  (1.753 m), weight 188 lb 15 oz (85.7 kg), SpO2 99 %.  Intake/Output from previous day: 09/13 0701 - 09/14 0700 In: 2434 [I.V.:1400; Blood:784; IV Piggyback:250] Out: 1800 [Urine:1800]  Physical Exam:  HEENT: No thrush or ulcers Lungs: Clear anteriorly Cardiac: Regular rate and rhythm Abdomen: Distended, no mass, no hepatosplenomegaly Skin: Palms without erythema    Lab Results:  Recent Labs  05/06/17 1343 05/07/17 0448  WBC 2.8* 2.7*  HGB 7.4* 6.3*  HCT 22.0* 18.9*  PLT 172 145*    BMET  Recent Labs  05/06/17 1127 05/07/17 0448  NA 143 145  K 3.8 3.7  CL 111 115*  CO2 23 24  GLUCOSE 48* 101*  BUN 19 17  CREATININE 1.24 1.27*  CALCIUM 7.9* 7.5*    No results found for: CEA1  Studies/Results: Dg Chest 2 View  Result Date: 05/06/2017 CLINICAL DATA:  History of pancreatic cancer and hypoglycemia EXAM: CHEST  2 VIEW COMPARISON:  01/07/2017 FINDINGS: Cardiac shadow is mildly enlarged. A defibrillator is again seen and stable. The lungs are well aerated bilaterally. No focal infiltrate is noted. Small bilateral pleural effusions are seen. No significant pulmonary edema is noted. No bony abnormality is seen. IMPRESSION: Small bilateral pleural effusions. No other focal abnormality is seen.  Electronically Signed   By: Inez Catalina M.D.   On: 05/06/2017 11:57   Ct Head Wo Contrast  Result Date: 05/06/2017 CLINICAL DATA:  [place] last night from a chair striking forehead on edge of sink, frontal abrasion, history dementia, CHF, pancreatic cancer, coronary artery disease, type II diabetes mellitus EXAM: CT HEAD WITHOUT CONTRAST TECHNIQUE: Contiguous axial images were obtained from the base of the skull through the vertex without intravenous contrast. Sagittal and coronal MPR images reconstructed from axial data set. COMPARISON:  04/30/2016 FINDINGS: Brain: Generalized atrophy. Normal ventricular morphology. No midline shift or mass effect. Small vessel chronic ischemic changes of deep cerebral white matter. 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 No intracranial hemorrhage, mass lesion or evidence of acute infarction. No extra-axial fluid collections. Vascular: Atherosclerotic calcification of internal carotid and vertebral arteries at skullbase Skull: Intact. Mild supraorbital scalp contusion/soft tissue swelling. Sinuses/Orbits: Intraorbital soft tissue planes clear. Bony orbits intact. Other: N/A IMPRESSION: Atrophy with small vessel chronic ischemic changes of deep cerebral white matter. Tiny old lacunar infarcts LEFT external capsule. No acute intracranial abnormalities. Electronically Signed   By: Lavonia Dana M.D.   On: 05/06/2017 11:53   Dg Abd Portable 1v  Result Date: 05/06/2017 CLINICAL DATA:  Episodes of hypoglycemia. EXAM: PORTABLE ABDOMEN - 1 VIEW COMPARISON:  None. FINDINGS: The bowel gas pattern is normal. No radio-opaque calculi or other significant radiographic abnormality are seen. Left hip replacement is noted. IMPRESSION: No bowel obstruction. Electronically Signed   By: Mallie Darting.D.  On: 05/06/2017 21:08    Medications: I have reviewed the patient's current medications.  Assessment/Plan: 1. Pancreas cancer, pancreas body mass on CT 12/08/2016  CT 12/08/2016  consistent with a pancreas body mass and liver lesions suspicious for metastases  EUS on 12/15/2016 confirmed a pancreas body mass, T3 N0, biopsy consistent with adenocarcinoma  Abdominal ultrasound 02/26/2017-pancreas mass, no liver lesions apparent  PET scan 03/19/2017-enlargement of pancreas mass with vascular encasement and new involvement of the tail of the pancreas, mildly hypermetabolic chest and porta hepatis nodes. Possible hypermetabolic activity at [id number] to represent a benign etiology  Initiation of concurrent radiation/Xeloda 04/13/2017  2. Acute cholecystitis February 2018, status post placement of a cholecystostomy tube  3. Diabetes  4. Congestive heart failure;hospitalized May 2018 with CHF  5. Peripheral vascular disease, status post bilateral BKA  6. Atrial fibrillation  7. Microcytic anemia-progressive, iron deficiency?, Bleeding related to Xarelto or tumor involving the GI tract  Red cell transfusion 04/07/2017 and 05/07/2017  8. C. difficile colitis April 2018  9. Chronic renal failure  10. Anorectal hyperenhancing lesion noted on CT 12/08/2016  11. Admission 05/06/2017 with hypoglycemia  12. Klebsiella Urinary tract infection 05/06/2017  13. Positive blood culture for staph aureus 05/06/2017  [name] [name] has a history of locally advanced pancreas cancer. He is currently being treated with palliative Xeloda and radiation. He is admitted with symptomatic hypoglycemia and severe anemia. The hypoglycemia is likely related to insulin therapy and poor oral intake.  The anemia is secondary to chronic disease and potentially GI bleeding. He was maintained on Xarelto prior to hospital admission.  Recommendations: 1. Hold Xeloda and radiation 2. Dr. Paulita Fujita to consider an upper endoscopy 3. Abdominal ultrasound to look for significant ascites 4. Check hemoglobin after the red cell transfusion 5. Imodium as needed for diarrhea 6.  Investigate significance of the positive blood culture   Please call oncology as needed. I will check on him 05/11/2017 if he remains in the hospital.   LOS: 2 days   Donneta Romberg, MD   05/08/2017, 8:13 AM

## 2017-05-08 NOTE — Care Management Note (Signed)
Case Management Note  Patient Details  Name: [name] [name] MRN: [id number] Date of Birth: [birth year]  Subjective/Objective: 71 y/o m admitted w/hypoglycemia. Hx: Met Pancreatic Ca, Bilat BKA,w/c bound, has prosthetics.From home w/spouse.  GI following.                 Action/Plan:d/c plan home.   Expected Discharge Date:   (UNKNOWN)               Expected Discharge Plan:  Home/Self Care  In-House Referral:     Discharge planning Services  CM Consult  Post Acute Care Choice:    Choice offered to:     DME Arranged:    DME Agency:     HH Arranged:    HH Agency:     Status of Service:  In process, will continue to follow  If discussed at Long Length of Stay Meetings, dates discussed:    Additional Comments:  Dessa Phi, RN 05/08/2017, 1:07 PM

## 2017-05-08 NOTE — Progress Notes (Signed)
PHARMACY - PHYSICIAN COMMUNICATION CRITICAL VALUE ALERT - BLOOD CULTURE IDENTIFICATION (BCID)  Results for orders placed or performed during the hospital encounter of 05/06/17  Blood Culture ID Panel (Reflexed) (Collected: 05/06/2017 12:00 PM)  Result Value Ref Range   Enterococcus species NOT DETECTED NOT DETECTED   Vancomycin resistance NOT DETECTED NOT DETECTED   Listeria monocytogenes NOT DETECTED NOT DETECTED   Staphylococcus species NOT DETECTED NOT DETECTED   Staphylococcus aureus NOT DETECTED NOT DETECTED   Methicillin resistance NOT DETECTED NOT DETECTED   Streptococcus species NOT DETECTED NOT DETECTED   Streptococcus agalactiae NOT DETECTED NOT DETECTED   Streptococcus pneumoniae NOT DETECTED NOT DETECTED   Streptococcus pyogenes NOT DETECTED NOT DETECTED   Acinetobacter baumannii NOT DETECTED NOT DETECTED   Enterobacteriaceae species NOT DETECTED NOT DETECTED   Enterobacter cloacae complex NOT DETECTED NOT DETECTED   Escherichia coli NOT DETECTED NOT DETECTED   Klebsiella oxytoca NOT DETECTED NOT DETECTED   Klebsiella pneumoniae NOT DETECTED NOT DETECTED   Proteus species NOT DETECTED NOT DETECTED   Serratia marcescens NOT DETECTED NOT DETECTED   Carbapenem resistance NOT DETECTED NOT DETECTED   Haemophilus influenzae NOT DETECTED NOT DETECTED   Neisseria meningitidis NOT DETECTED NOT DETECTED   Pseudomonas aeruginosa NOT DETECTED NOT DETECTED   Candida albicans NOT DETECTED NOT DETECTED   Candida glabrata NOT DETECTED NOT DETECTED   Candida krusei NOT DETECTED NOT DETECTED   Candida parapsilosis NOT DETECTED NOT DETECTED   Candida tropicalis NOT DETECTED NOT DETECTED    Name of physician (or Provider) Contacted: Dr. Myna Hidalgo  Changes to prescribed antibiotics required: While Gram statin showed GPR, No ID resulted, therefore no change at this time.  Nani Skillern Crowford 05/08/2017  6:07 AM

## 2017-05-08 NOTE — Progress Notes (Signed)
Received call back from Dr. Inda Merlin office from [facility] stating patient should only be taking Toujeo 25 units daily and no Levemir. Will share with wife and patient.  Thank you, Nani Gasser. Wandalene Abrams, RN, MSN, CDE  Diabetes Coordinator Inpatient Glycemic Control Team Team Pager [phone] (8am-5pm) 05/08/2017 8:26 AM

## 2017-05-09 ENCOUNTER — Inpatient Hospital Stay (HOSPITAL_COMMUNITY): Payer: Medicare Other

## 2017-05-09 LAB — TYPE AND SCREEN
ABO/RH(D): O POS
ANTIBODY SCREEN: NEGATIVE
UNIT DIVISION: 0
UNIT DIVISION: 0

## 2017-05-09 LAB — BASIC METABOLIC PANEL
Anion gap: 8 (ref 5–15)
BUN: 12 mg/dL (ref 6–20)
CO2: 24 mmol/L (ref 22–32)
CREATININE: 1.13 mg/dL (ref 0.61–1.24)
Calcium: 7.8 mg/dL — ABNORMAL LOW (ref 8.9–10.3)
Chloride: 114 mmol/L — ABNORMAL HIGH (ref 101–111)
GFR calc Af Amer: >60 mL/min (ref >60)
GLUCOSE: 178 mg/dL — AB (ref 65–99)
Potassium: 3.3 mmol/L — ABNORMAL LOW (ref 3.5–5.1)
Sodium: 146 mmol/L — ABNORMAL HIGH (ref 135–145)

## 2017-05-09 LAB — BPAM RBC
BLOOD PRODUCT EXPIRATION DATE: 201810132359
Blood Product Expiration Date: 201810132359
ISSUE DATE / TIME: 201809131617
ISSUE DATE / TIME: 201809140104
UNIT TYPE AND RH: 5100
Unit Type and Rh: 5100

## 2017-05-09 LAB — CBC
HEMATOCRIT: 29.9 % — AB (ref 39.0–52.0)
Hemoglobin: 10.2 g/dL — ABNORMAL LOW (ref 13.0–17.0)
MCH: 26.4 pg (ref 26.0–34.0)
MCHC: 34.1 g/dL (ref 30.0–36.0)
MCV: 77.3 fL — AB (ref 78.0–100.0)
Platelets: 139 10*3/uL — ABNORMAL LOW (ref 150–400)
RBC: 3.87 MIL/uL — ABNORMAL LOW (ref 4.22–5.81)
RDW: 25.3 % — AB (ref 11.5–15.5)
WBC: 2.6 10*3/uL — ABNORMAL LOW (ref 4.0–10.5)

## 2017-05-09 LAB — GLUCOSE, CAPILLARY
Glucose-Capillary: 222 mg/dL — ABNORMAL HIGH (ref 65–99)
Glucose-Capillary: 185 mg/dL — ABNORMAL HIGH (ref 65–99)
Glucose-Capillary: 205 mg/dL — ABNORMAL HIGH (ref 65–99)
Glucose-Capillary: 249 mg/dL — ABNORMAL HIGH (ref 65–99)
Glucose-Capillary: 232 mg/dL — ABNORMAL HIGH (ref 65–99)

## 2017-05-09 LAB — MAGNESIUM: Magnesium: 1.6 mg/dL — ABNORMAL LOW (ref 1.7–2.4)

## 2017-05-09 LAB — OCCULT BLOOD X 1 CARD TO LAB, STOOL: FECAL OCCULT BLD: POSITIVE — AB

## 2017-05-09 MED ORDER — POTASSIUM CHLORIDE CRYS ER 20 MEQ PO TBCR
20.0000 meq | EXTENDED_RELEASE_TABLET | Freq: Once | ORAL | Status: AC
  Administered 2017-05-09: 20 meq via ORAL
  Filled 2017-05-09: qty 1

## 2017-05-09 MED ORDER — LOPERAMIDE HCL 2 MG PO CAPS
2.0000 mg | ORAL_CAPSULE | ORAL | Status: DC | PRN
  Administered 2017-05-10 – 2017-05-12 (×4): 2 mg via ORAL
  Filled 2017-05-09 (×4): qty 1

## 2017-05-09 MED ORDER — FUROSEMIDE 40 MG PO TABS
40.0000 mg | ORAL_TABLET | Freq: Two times a day (BID) | ORAL | Status: DC
  Administered 2017-05-09 – 2017-05-11 (×4): 40 mg via ORAL
  Filled 2017-05-09 (×4): qty 1

## 2017-05-09 MED ORDER — MAGNESIUM SULFATE 2 GM/50ML IV SOLN
2.0000 g | Freq: Once | INTRAVENOUS | Status: AC
  Administered 2017-05-09: 2 g via INTRAVENOUS
  Filled 2017-05-09: qty 50

## 2017-05-09 MED ORDER — POTASSIUM CHLORIDE CRYS ER 20 MEQ PO TBCR
40.0000 meq | EXTENDED_RELEASE_TABLET | Freq: Once | ORAL | Status: AC
  Administered 2017-05-09: 40 meq via ORAL
  Filled 2017-05-09: qty 2

## 2017-05-09 NOTE — Plan of Care (Signed)
Problem: Skin Integrity: Goal: Risk for impaired skin integrity will decrease Outcome: Not Progressing Pt continues to have stool incontinents causing skin breakdown. Barrier cream apply

## 2017-05-09 NOTE — Progress Notes (Signed)
PROGRESS NOTE    [name] [name]  ZOX:[id number] DOB: [birth year] DOA: 05/06/2017 PCP: Josetta Huddle, MD    Brief Narrative: [name] [name] is a 71 y.o. male with medical history significant for metastatic pancreatic cancer, chronic systolic/diastolic congestive heart failure, stage III chronic kidney disease and diabetes mellitus who was brought to the emergency room with complaints of hypoglycemia. Patient's wife who cares for him at home noted that in the last week, she started noticing that he had both elevated as well as big drops in his blood sugars, sometimes ranging as low as the 50s. His mentation did not change, but for the past day or so, he's been having nausea and vomiting. She states his bowel movements have not changed although in the last few days, he felt like his abdomen became much more distended. Patient became weaker and last night, became so weak that he had a near syncopal event scraping his forehead against the bathroom while walking was on the toilet. Patient was brought into the emergency room today via EMS  ED Course: In the emergency room, patient was found to have a large UTI. He was also found to have episodes of hypoglycemia. He was given amps of D50, but still CBG stable persistently in the 50s. Chest x-ray noted mild bilateral pleural effusions and a BNP was mildly elevated in the 500s. Patient's hemoglobin was at 7.4, stable from several days ago, white count 2.8 and rest of labs were unremarkable. Hospitalist call for further evaluation and admission    Assessment & Plan:   Principal Problem:   Hypoglycemia Active Problems:   NICM- EF 20-25% echo 6/14   Chronic combined systolic (congestive) and diastolic (congestive) heart failure (HCC)   HTN (hypertension)   BiV ICD (BS).  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 Resolved.   2-Hypoglycemia; hold tujeo.  Resolved. Will stop D 5 and monitor CBG>  SSI  Acute on chronic combine systolic HF; Chest x ray with edema.  Received IV lasix. I will change lasix to daily due to diarrhea and soft BP.  Hold entresto  Echocardiogram done 10/14/16 noted EF of 20-25 percent plus grade 2 diastolic dysfunction and mild to moderate mitral regurg Change lasix to oral.   Diarrhea;  GI pathogen negative  C diff negative  Discontinue flagyl.   Hypokalemia; replete.  Hypomagnesemia; replaced.   Pancytopenia;  Related to chemo.  Dr Ammie Dalton will see patient.   PAF; hold xarelto due to anemia.   Anemia; acute blood loss. patient to received 2 units of PRBC>  GI consulted.  Discussed with Dr Benay Spice , he doesn't think that low hb is related to chemo. He is recommending GI evaluation.  GI planning endoscopy at some point.  Hb stable.   Dementia: Mild, stable, no evidence of acute behavioral disturbance  Stage 3 chronic kidney disease: Creatinine at 1.24. At baseline  Acute lower UTI: IV Rocephin.  Blood culture. One of two positive dysthyroid. Likely contaminant  Urine culture grew Klebsiella. 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 on chemo and radiation.  Resume pancreatic enzymes Abdominal distension;  check US> no significant ascites.   DVT prophylaxis: hold xarelto due to low hemoglobin.  Code Status: full code.  Family Communication: wife at bedside.  Disposition Plan: remain inpatient.   Consultants:   GI  Dr Benay Spice    Procedures: none  Antimicrobials: ceftriaxone Flagyl   Subjective: Diarrhea improving. He is feeling better   Objective: Vitals:   05/08/17 2220 05/09/17 0632 05/09/17 0835 05/09/17 1412  BP:  115/65 (!) 133/59 (!) 121/53 109/64  Pulse: 90 77 67 89  Resp: 20 18  18   Temp:  97.9 F (36.6 C)  98 F (36.7 C)  TempSrc: Oral Oral  Oral  SpO2: 97% 100% 95% 96%  Weight:      Height:        Intake/Output Summary (Last 24 hours) at 05/09/17 1523 Last data filed at 05/09/17 1436  Gross per 24 hour  Intake          2150.83 ml  Output              425 ml  Net          1725.83 ml   Filed Weights   05/06/17 1047 05/06/17 1603  Weight: 80.7 kg (178 lb) 85.7 kg (188 lb 15 oz)    Examination:  General exam: NAD Respiratory system: CTA Cardiovascular system: S 1, S 2 RRR Gastrointestinal system: soft, nt, no distended Central nervous system: alert , non focal.  Extremities: Bilateral BKA Skin: No rashes, lesions or ulcers   Data Reviewed: I have personally reviewed following labs and imaging studies  CBC:  Recent Labs Lab 05/04/17 1108 05/06/17 1343 05/07/17 0448 05/08/17 0821 05/09/17 0536  WBC 4.5 2.8* 2.7* 2.3* 2.6*  NEUTROABS 3.8 2.3  --   --   --   HGB 7.5* 7.4* 6.3* 9.8* 10.2*  HCT 22.8* 22.0* 18.9* 28.3* 29.9*  MCV 75.8* 73.3* 73.3* 77.3* 77.3*  PLT 182 172 145* 143* 709*   Basic Metabolic Panel:  Recent Labs Lab 05/05/17 1022 05/06/17 1127 05/07/17 0448 05/08/17 0821 05/09/17 0536  NA 144 143 145 143 146*  K 3.5 3.8 3.7 3.3* 3.3*  CL 115* 111 115* 112* 114*  CO2 23 23 24 23 24   GLUCOSE 89 48* 101* 240* 178*  BUN 15 19 17 14 12   CREATININE 1.31* 1.24 1.27* 1.22 1.13  CALCIUM 8.0* 7.9* 7.5* 7.6* 7.8*  MG  --   --   --   --  1.6*   GFR: Estimated Creatinine Clearance: 63.2 mL/min (by C-G formula based on SCr of 1.13 mg/dL). Liver Function Tests:  Recent Labs Lab 05/06/17 1127  AST 18  ALT 10*  ALKPHOS 41  BILITOT 0.5  PROT 6.3*  ALBUMIN 2.7*    Recent Labs Lab 05/06/17 1127  LIPASE 13   No results for input(s): AMMONIA in the last 168 hours. Coagulation Profile:  Recent Labs Lab 05/06/17 1152  INR 2.28   Cardiac  Enzymes: No results for input(s): CKTOTAL, CKMB, CKMBINDEX, TROPONINI in the last 168 hours. BNP (last 3 results) No results for input(s): PROBNP in the last 8760 hours. HbA1C:  Recent Labs  05/06/17 1603  HGBA1C 8.0*   CBG:  Recent Labs Lab 05/08/17 1300 05/08/17 1906 05/08/17 2218 05/09/17 0748 05/09/17 1129  GLUCAP 226* 234* 222* 185* 205*   Lipid Profile: No results for input(s): CHOL, HDL, LDLCALC, TRIG, CHOLHDL, LDLDIRECT in the last 72 hours. Thyroid Function Tests: No results for input(s): TSH, T4TOTAL, FREET4, T3FREE, THYROIDAB in  the last 72 hours. Anemia Panel: No results for input(s): VITAMINB12, FOLATE, FERRITIN, TIBC, IRON, RETICCTPCT in the last 72 hours. Sepsis Labs:  Recent Labs Lab 05/06/17 1159  LATICACIDVEN 1.11    Recent Results (from the past 240 hour(s))  Urine culture     Status: Abnormal   Collection Time: 05/06/17 11:27 AM  Result Value Ref Range Status   Specimen Description URINE, CLEAN CATCH  Final   Special Requests Immunocompromised  Final   Culture >=100,000 COLONIES/mL KLEBSIELLA PNEUMONIAE (A)  Final   Report Status 05/08/2017 FINAL  Final   Organism ID, Bacteria KLEBSIELLA PNEUMONIAE (A)  Final      Susceptibility   Klebsiella pneumoniae - MIC*    AMPICILLIN RESISTANT Resistant     CEFAZOLIN <=4 SENSITIVE Sensitive     CEFTRIAXONE <=1 SENSITIVE Sensitive     CIPROFLOXACIN <=0.25 SENSITIVE Sensitive     GENTAMICIN <=1 SENSITIVE Sensitive     IMIPENEM <=0.25 SENSITIVE Sensitive     NITROFURANTOIN 64 INTERMEDIATE Intermediate     TRIMETH/SULFA <=20 SENSITIVE Sensitive     AMPICILLIN/SULBACTAM <=2 SENSITIVE Sensitive     PIP/TAZO <=4 SENSITIVE Sensitive     Extended ESBL NEGATIVE Sensitive     * >=100,000 COLONIES/mL KLEBSIELLA PNEUMONIAE  Culture, blood (routine x 2)     Status: Abnormal (Preliminary result)   Collection Time: 05/06/17 12:00 PM  Result Value Ref Range Status   Specimen Description BLOOD LEFT ANTECUBITAL   Final   Special Requests   Final    BOTTLES DRAWN AEROBIC AND ANAEROBIC Blood Culture adequate volume   Culture  Setup Time   Final    GRAM POSITIVE RODS AEROBIC BOTTLE ONLY CRITICAL RESULT CALLED TO, READ BACK BY AND VERIFIED WITH: J.GRIMSLEY PHARMD 05/08/17 0603 L.CHAMPION    Culture (A)  Final    DIPHTHEROIDS(CORYNEBACTERIUM SPECIES) Standardized susceptibility testing for this organism is not available. Performed at [facility], [place] [address], [place], [place] [address]    Report Status PENDING  Incomplete  Blood Culture ID Panel (Reflexed)     Status: None   Collection Time: 05/06/17 12:00 PM  Result Value Ref Range Status   Enterococcus species NOT DETECTED NOT DETECTED Final   Vancomycin resistance NOT DETECTED NOT DETECTED Final   Listeria monocytogenes NOT DETECTED NOT DETECTED Final   Staphylococcus species NOT DETECTED NOT DETECTED Final   Staphylococcus aureus NOT DETECTED NOT DETECTED Final   Methicillin resistance NOT DETECTED NOT DETECTED Final   Streptococcus species NOT DETECTED NOT DETECTED Final   Streptococcus agalactiae NOT DETECTED NOT DETECTED Final   Streptococcus pneumoniae NOT DETECTED NOT DETECTED Final   Streptococcus pyogenes NOT DETECTED NOT DETECTED Final   Acinetobacter baumannii NOT DETECTED NOT DETECTED Final   Enterobacteriaceae species NOT DETECTED NOT DETECTED Final   Enterobacter cloacae complex NOT DETECTED NOT DETECTED Final   Escherichia coli NOT DETECTED NOT DETECTED Final   Klebsiella oxytoca NOT DETECTED NOT DETECTED Final   Klebsiella pneumoniae NOT DETECTED NOT DETECTED Final   Proteus species NOT DETECTED NOT DETECTED Final   Serratia marcescens NOT DETECTED NOT DETECTED Final   Carbapenem resistance NOT DETECTED NOT DETECTED Final   Haemophilus influenzae NOT DETECTED NOT DETECTED Final   Neisseria meningitidis NOT DETECTED NOT DETECTED Final   Pseudomonas aeruginosa NOT DETECTED NOT DETECTED Final   Candida albicans NOT  DETECTED NOT DETECTED Final   Candida glabrata NOT DETECTED NOT DETECTED Final   Candida krusei NOT DETECTED NOT DETECTED Final  Candida parapsilosis NOT DETECTED NOT DETECTED Final   Candida tropicalis NOT DETECTED NOT DETECTED Final    Comment: Performed at [facility], [place] [address] [place], [place] [address]  Culture, blood (routine x 2)     Status: None (Preliminary result)   Collection Time: 05/06/17  1:00 PM  Result Value Ref Range Status   Specimen Description RIGHT ANTECUBITAL  Final   Special Requests   Final    BOTTLES DRAWN AEROBIC AND ANAEROBIC Blood Culture adequate volume   Culture   Final    NO GROWTH 3 DAYS Performed at [facility], [place] [address] [place], [place] [address]    Report Status PENDING  Incomplete  MRSA PCR Screening     Status: Abnormal   Collection Time: 05/06/17  4:17 PM  Result Value Ref Range Status   MRSA by PCR POSITIVE (A) NEGATIVE Final    Comment:        The GeneXpert MRSA Assay (FDA approved for NASAL specimens only), is one component of a comprehensive MRSA colonization surveillance program. It is not intended to diagnose MRSA infection nor to guide or monitor treatment for MRSA infections. RESULT CALLED TO, READ BACK BY AND VERIFIED WITH: CORCARAN,R RN 9.12.18 @2133  ZANDO,C   C difficile quick scan w PCR reflex     Status: None   Collection Time: 05/08/17  7:01 AM  Result Value Ref Range Status   C Diff antigen NEGATIVE NEGATIVE Final   C Diff toxin NEGATIVE NEGATIVE Final   C Diff interpretation No C. difficile detected.  Final  Gastrointestinal Panel by PCR , Stool     Status: None   Collection Time: 05/08/17  7:01 AM  Result Value Ref Range Status   Campylobacter species NOT DETECTED NOT DETECTED Final   Plesimonas shigelloides NOT DETECTED NOT DETECTED Final   Salmonella species NOT DETECTED NOT DETECTED Final   Yersinia enterocolitica NOT DETECTED NOT DETECTED Final   Vibrio species NOT DETECTED NOT  DETECTED Final   Vibrio cholerae NOT DETECTED NOT DETECTED Final   Enteroaggregative E coli (EAEC) NOT DETECTED NOT DETECTED Final   Enteropathogenic E coli (EPEC) NOT DETECTED NOT DETECTED Final   Enterotoxigenic E coli (ETEC) NOT DETECTED NOT DETECTED Final   Shiga like toxin producing E coli (STEC) NOT DETECTED NOT DETECTED Final   Shigella/Enteroinvasive E coli (EIEC) NOT DETECTED NOT DETECTED Final   Cryptosporidium NOT DETECTED NOT DETECTED Final   Cyclospora cayetanensis NOT DETECTED NOT DETECTED Final   Entamoeba histolytica NOT DETECTED NOT DETECTED Final   Giardia lamblia NOT DETECTED NOT DETECTED Final   Adenovirus F40/41 NOT DETECTED NOT DETECTED Final   Astrovirus NOT DETECTED NOT DETECTED Final   Norovirus GI/GII NOT DETECTED NOT DETECTED Final   Rotavirus A NOT DETECTED NOT DETECTED Final   Sapovirus (I, II, IV, and V) NOT DETECTED NOT DETECTED Final         Radiology Studies: US Abdomen Limited  Result Date: 05/09/2017 CLINICAL DATA:  Abdominal distention. EXAM: LIMITED ABDOMEN ULTRASOUND FOR ASCITES TECHNIQUE: Limited ultrasound survey for ascites was performed in all four abdominal quadrants. COMPARISON:  Abdominal ultrasound dated February 26, 2017. FINDINGS: Small ascites in the left lower quadrant. IMPRESSION: Small ascites in the left lower quadrant. Electronically Signed   By: Titus Dubin M.D.   On: 05/09/2017 08:50        Scheduled Meds: . carvedilol  6.25 mg Oral BID WC  . Chlorhexidine Gluconate Cloth  6 each  Topical Q0600  . furosemide  40 mg Oral BID  . insulin aspart  0-5 Units Subcutaneous QHS  . insulin aspart  0-9 Units Subcutaneous TID WC  . iron polysaccharides  150 mg Oral Daily  . multivitamin with minerals  1 tablet Oral Daily  . mupirocin ointment  1 application Nasal BID  . pantoprazole  40 mg Oral Daily  . pregabalin  75 mg Oral BID  . saccharomyces boulardii  250 mg Oral BID  . tamsulosin  0.4 mg Oral QPC supper   Continuous  Infusions: . sodium chloride    . cefTRIAXone (ROCEPHIN)  IV Stopped (05/09/17 1436)  . metronidazole 500 mg (05/09/17 1440)     LOS: 3 days    Time spent: 35 minutes,     Ita Fritzsche, Cassie Freer, MD [facility] Pager [phone]  If 7PM-7AM, please contact night-coverage [email or web address] Password [facility] 05/09/2017, 3:23 PM

## 2017-05-09 NOTE — Progress Notes (Signed)
Assumed care of patient at 4:45pm. Agree with previous Nurse assessment.  [name] [name]. Brigitte Pulse, RN

## 2017-05-09 NOTE — Progress Notes (Signed)
[facility] Progress Note  Subjective: Patient states he feels much better today than yesterday. No complaints of diarrhea today or bleeding.  Objective: Vital signs in last 24 hours: Temp:  [97.9 F (36.6 C)] 97.9 F (36.6 C) (09/15 0093) Pulse Rate:  [67-92] 67 (09/15 0835) Resp:  [18-20] 18 (09/15 8182) BP: (115-133)/(53-65) 121/53 (09/15 0835) SpO2:  [95 %-100 %] 95 % (09/15 0835) Weight change:    PE:  No distress  Abdomen soft  Lab Results: Results for orders placed or performed during the hospital encounter of 05/06/17 (from the past 24 hour(s))  Glucose, capillary     Status: Abnormal   Collection Time: 05/08/17  1:00 PM  Result Value Ref Range   Glucose-Capillary 226 (H) 65 - 99 mg/dL  Glucose, capillary     Status: Abnormal   Collection Time: 05/08/17  7:06 PM  Result Value Ref Range   Glucose-Capillary 234 (H) 65 - 99 mg/dL  Glucose, capillary     Status: Abnormal   Collection Time: 05/08/17 10:18 PM  Result Value Ref Range   Glucose-Capillary 222 (H) 65 - 99 mg/dL  Occult blood card to lab, stool     Status: Abnormal   Collection Time: 05/08/17 10:31 PM  Result Value Ref Range   Fecal Occult Bld POSITIVE (A) NEGATIVE  Basic metabolic panel     Status: Abnormal   Collection Time: 05/09/17  5:36 AM  Result Value Ref Range   Sodium 146 (H) 135 - 145 mmol/L   Potassium 3.3 (L) 3.5 - 5.1 mmol/L   Chloride 114 (H) 101 - 111 mmol/L   CO2 24 22 - 32 mmol/L   Glucose, Bld 178 (H) 65 - 99 mg/dL   BUN 12 6 - 20 mg/dL   Creatinine, Ser 1.13 0.61 - 1.24 mg/dL   Calcium 7.8 (L) 8.9 - 10.3 mg/dL   GFR calc non Af Amer >60 >60 mL/min   GFR calc Af Amer >60 >60 mL/min   Anion gap 8 5 - 15  CBC     Status: Abnormal   Collection Time: 05/09/17  5:36 AM  Result Value Ref Range   WBC 2.6 (L) 4.0 - 10.5 K/uL   RBC 3.87 (L) 4.22 - 5.81 MIL/uL   Hemoglobin 10.2 (L) 13.0 - 17.0 g/dL   HCT 29.9 (L) 39.0 - 52.0 %   MCV 77.3 (L) 78.0 - 100.0 fL   MCH 26.4  26.0 - 34.0 pg   MCHC 34.1 30.0 - 36.0 g/dL   RDW 25.3 (H) 11.5 - 15.5 %   Platelets 139 (L) 150 - 400 K/uL  Magnesium     Status: Abnormal   Collection Time: 05/09/17  5:36 AM  Result Value Ref Range   Magnesium 1.6 (L) 1.7 - 2.4 mg/dL  Glucose, capillary     Status: Abnormal   Collection Time: 05/09/17  7:48 AM  Result Value Ref Range   Glucose-Capillary 185 (H) 65 - 99 mg/dL    Studies/Results: US Abdomen Limited  Result Date: 05/09/2017 CLINICAL DATA:  Abdominal distention. EXAM: LIMITED ABDOMEN ULTRASOUND FOR ASCITES TECHNIQUE: Limited ultrasound survey for ascites was performed in all four abdominal quadrants. COMPARISON:  Abdominal ultrasound dated February 26, 2017. FINDINGS: Small ascites in the left lower quadrant. IMPRESSION: Small ascites in the left lower quadrant. Electronically Signed   By: Titus Dubin M.D.   On: 05/09/2017 08:50      Assessment: Diarrhea clinically seems improved today  Anemia no further drop in blood  count in fact it is up today  Plan:   Continue observation and supportive care    Cassell Clement 05/09/2017, 11:29 AM  Pager: [phone] If no answer or after 5 PM call [phone]

## 2017-05-10 LAB — BASIC METABOLIC PANEL
ANION GAP: 7 (ref 5–15)
BUN: 12 mg/dL (ref 6–20)
CO2: 23 mmol/L (ref 22–32)
Calcium: 7.7 mg/dL — ABNORMAL LOW (ref 8.9–10.3)
Chloride: 113 mmol/L — ABNORMAL HIGH (ref 101–111)
Creatinine, Ser: 1.09 mg/dL (ref 0.61–1.24)
GFR calc Af Amer: >60 mL/min (ref >60)
GFR calc non Af Amer: >60 mL/min (ref >60)
GLUCOSE: 253 mg/dL — AB (ref 65–99)
POTASSIUM: 4.1 mmol/L (ref 3.5–5.1)
Sodium: 143 mmol/L (ref 135–145)

## 2017-05-10 LAB — CBC
HEMATOCRIT: 30.6 % — AB (ref 39.0–52.0)
Hemoglobin: 10.2 g/dL — ABNORMAL LOW (ref 13.0–17.0)
MCH: 26 pg (ref 26.0–34.0)
MCHC: 33.3 g/dL (ref 30.0–36.0)
MCV: 77.9 fL — AB (ref 78.0–100.0)
PLATELETS: 144 10*3/uL — AB (ref 150–400)
RBC: 3.93 MIL/uL — AB (ref 4.22–5.81)
RDW: 26 % — ABNORMAL HIGH (ref 11.5–15.5)
WBC: 2.1 10*3/uL — AB (ref 4.0–10.5)

## 2017-05-10 LAB — CULTURE, BLOOD (ROUTINE X 2): Special Requests: ADEQUATE

## 2017-05-10 LAB — GLUCOSE, CAPILLARY
GLUCOSE-CAPILLARY: 244 mg/dL — AB (ref 65–99)
GLUCOSE-CAPILLARY: 299 mg/dL — AB (ref 65–99)
Glucose-Capillary: 271 mg/dL — ABNORMAL HIGH (ref 65–99)
Glucose-Capillary: 304 mg/dL — ABNORMAL HIGH (ref 65–99)

## 2017-05-10 MED ORDER — PANTOPRAZOLE SODIUM 40 MG PO TBEC
40.0000 mg | DELAYED_RELEASE_TABLET | Freq: Two times a day (BID) | ORAL | Status: DC
  Administered 2017-05-10 – 2017-05-11 (×2): 40 mg via ORAL
  Filled 2017-05-10 (×2): qty 1

## 2017-05-10 MED ORDER — MAGNESIUM SULFATE 2 GM/50ML IV SOLN
2.0000 g | Freq: Once | INTRAVENOUS | Status: AC
  Administered 2017-05-10: 2 g via INTRAVENOUS
  Filled 2017-05-10: qty 50

## 2017-05-10 MED ORDER — PANCRELIPASE (LIP-PROT-AMYL) 12000-38000 UNITS PO CPEP
36000.0000 [IU] | ORAL_CAPSULE | Freq: Three times a day (TID) | ORAL | Status: DC
  Administered 2017-05-10 – 2017-05-14 (×6): 36000 [IU] via ORAL
  Filled 2017-05-10 (×9): qty 3

## 2017-05-10 MED ORDER — INSULIN DETEMIR 100 UNIT/ML ~~LOC~~ SOLN
10.0000 [IU] | Freq: Every day | SUBCUTANEOUS | Status: DC
  Administered 2017-05-10: 10 [IU] via SUBCUTANEOUS
  Filled 2017-05-10 (×2): qty 0.1

## 2017-05-10 NOTE — Progress Notes (Signed)
[facility] Progress Note  Subjective: Patient states his diarrhea is better. No signs of active GI bleed although stools are heme positive. Hemoglobin and hematocrit are stable.  Objective: Vital signs in last 24 hours: Temp:  [97.9 F (36.6 C)-98.2 F (36.8 C)] 98.2 F (36.8 C) (09/16 0717) Pulse Rate:  [89-96] 96 (09/16 0717) Resp:  [15-18] 16 (09/16 0717) BP: (109-130)/(62-70) 130/62 (09/16 0717) SpO2:  [96 %-100 %] 100 % (09/16 0717) Weight change:    PE:  No distress  Heart regular rhythm  Lungs clear  Abdomen soft  Lab Results: Results for orders placed or performed during the hospital encounter of 05/06/17 (from the past 24 hour(s))  Glucose, capillary     Status: Abnormal   Collection Time: 05/09/17 11:29 AM  Result Value Ref Range   Glucose-Capillary 205 (H) 65 - 99 mg/dL  Glucose, capillary     Status: Abnormal   Collection Time: 05/09/17  4:20 PM  Result Value Ref Range   Glucose-Capillary 249 (H) 65 - 99 mg/dL  Glucose, capillary     Status: Abnormal   Collection Time: 05/09/17  9:38 PM  Result Value Ref Range   Glucose-Capillary 232 (H) 65 - 99 mg/dL  Occult blood card to lab, stool     Status: Abnormal   Collection Time: 05/09/17 10:22 PM  Result Value Ref Range   Fecal Occult Bld POSITIVE (A) NEGATIVE  CBC     Status: Abnormal   Collection Time: 05/10/17  5:44 AM  Result Value Ref Range   WBC 2.1 (L) 4.0 - 10.5 K/uL   RBC 3.93 (L) 4.22 - 5.81 MIL/uL   Hemoglobin 10.2 (L) 13.0 - 17.0 g/dL   HCT 30.6 (L) 39.0 - 52.0 %   MCV 77.9 (L) 78.0 - 100.0 fL   MCH 26.0 26.0 - 34.0 pg   MCHC 33.3 30.0 - 36.0 g/dL   RDW 26.0 (H) 11.5 - 15.5 %   Platelets 144 (L) 150 - 400 K/uL  Basic metabolic panel     Status: Abnormal   Collection Time: 05/10/17  5:44 AM  Result Value Ref Range   Sodium 143 135 - 145 mmol/L   Potassium 4.1 3.5 - 5.1 mmol/L   Chloride 113 (H) 101 - 111 mmol/L   CO2 23 22 - 32 mmol/L   Glucose, Bld 253 (H) 65 - 99 mg/dL   BUN 12 6 - 20 mg/dL   Creatinine, Ser 1.09 0.61 - 1.24 mg/dL   Calcium 7.7 (L) 8.9 - 10.3 mg/dL   GFR calc non Af Amer >60 >60 mL/min   GFR calc Af Amer >60 >60 mL/min   Anion gap 7 5 - 15  Glucose, capillary     Status: Abnormal   Collection Time: 05/10/17  8:26 AM  Result Value Ref Range   Glucose-Capillary 271 (H) 65 - 99 mg/dL   Comment 1 Notify RN     Studies/Results: No results found.    Assessment: Metastatic pancreatic cancer  Anemia  Heme-positive stool, he had been on Xarelto.    Diarrhea, improving GI pathogens panel negative  Plan:   Supportive care for now. I would recommend discussion with his oncologist as to whether or not pursuing the heme positive stool endoscopically would be of value given his underlying diagnosis.    Cassell Clement 05/10/2017, 9:36 AM  Pager: [phone] If no answer or after 5 PM call [phone]

## 2017-05-10 NOTE — Progress Notes (Signed)
Pt has had 3 small to med sized loose, light brown stools so far this shift.    Guiac revealed POSITIVE results.  Cdiff on 9/14 was negative.

## 2017-05-10 NOTE — Progress Notes (Signed)
PROGRESS NOTE    [name] [name]  DSK:[id number] DOB: Oct 19, 1945 DOA: 05/06/2017 PCP: Josetta Huddle, MD    Brief Narrative: [name] [name] is a 71 y.o. male with medical history significant for metastatic pancreatic cancer, chronic systolic/diastolic congestive heart failure, stage III chronic kidney disease and diabetes mellitus who was brought to the emergency room with complaints of hypoglycemia. Patient's wife who cares for him at home noted that in the last week, she started noticing that he had both elevated as well as big drops in his blood sugars, sometimes ranging as low as the 50s. His mentation did not change, but for the past day or so, he's been having nausea and vomiting. She states his bowel movements have not changed although in the last few days, he felt like his abdomen became much more distended. Patient became weaker and last night, became so weak that he had a near syncopal event scraping his forehead against the bathroom while walking was on the toilet. Patient was brought into the emergency room today via EMS  ED Course: In the emergency room, patient was found to have a large UTI. He was also found to have episodes of hypoglycemia. He was given amps of D50, but still CBG stable persistently in the 50s. Chest x-ray noted mild bilateral pleural effusions and a BNP was mildly elevated in the 500s. Patient's hemoglobin was at 7.4, stable from several days ago, white count 2.8 and rest of labs were unremarkable. Hospitalist call for further evaluation and admission    Assessment & Plan:   Principal Problem:   Hypoglycemia Active Problems:   NICM- EF 20-25% echo 6/14   Chronic combined systolic (congestive) and diastolic (congestive) heart failure (HCC)   HTN (hypertension)   BiV ICD (BS).  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 Resolved.   2-Hypoglycemia; hold tujeo.  Resolved. Will stop D 5 and monitor CBG>  SSI Start levemir in the hospital.   Acute on chronic combine systolic HF; Chest x ray with edema.  Received IV lasix. I will change lasix to daily due to diarrhea and soft BP.  Hold entresto  Echocardiogram done 10/14/16 noted EF of 20-25 percent plus grade 2 diastolic dysfunction and mild to moderate mitral regurg Continue with oral lasix.   Diarrhea; improved. Related to chemo GI pathogen negative  C diff negative  Discontinue flagyl.   Hypokalemia; replete.  Hypomagnesemia; replaced.   Pancytopenia;  Related to chemo.  Dr Ammie Dalton will see patient.   PAF; hold xarelto due to anemia.   Anemia; acute blood loss. patient to received 2 units of PRBC>  GI consulted.  Discussed with Dr Benay Spice , he doesn't think that low hb is related to chemo. He is recommending GI evaluation.  GI planning endoscopy at some point.  Hb stable.   Dementia: Mild, stable, no evidence of acute behavioral disturbance  Stage 3 chronic kidney disease: Creatinine at 1.24. At baseline  Acute lower UTI: IV Rocephin.  Blood culture. One of two positive dysthyroid. Likely contaminant  Urine culture grew Klebsiella. 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 on  chemo and radiation.  Resume pancreatic enzymes Abdominal distension; check US> no significant ascites.   DVT prophylaxis: hold xarelto due to low hemoglobin.  Code Status: full code.  Family Communication: wife at bedside.  Disposition Plan: remain inpatient.   Consultants:   GI  Dr Benay Spice    Procedures: none  Antimicrobials: ceftriaxone Flagyl   Subjective: He had mild abdominal pain this am.    Objective: Vitals:    05/09/17 0835 05/09/17 1412 05/09/17 2141 05/10/17 0717  BP: (!) 121/53 109/64 122/70 130/62  Pulse: 67 89 95 96  Resp:  18 15 16   Temp:  98 F (36.7 C) 97.9 F (36.6 C) 98.2 F (36.8 C)  TempSrc:  Oral Oral Oral  SpO2: 95% 96% 97% 100%  Weight:      Height:        Intake/Output Summary (Last 24 hours) at 05/10/17 1455 Last data filed at 05/10/17 1334  Gross per 24 hour  Intake             1477 ml  Output             1220 ml  Net              257 ml   Filed Weights   05/06/17 1047 05/06/17 1603  Weight: 80.7 kg (178 lb) 85.7 kg (188 lb 15 oz)    Examination:  General exam: NAD Respiratory system: CTA Cardiovascular system: S 1, S 2 RRR Gastrointestinal system: Soft, nt, nd Central nervous system: alert , non focal.  Extremities: Bilateral BKA Skin: No rashes, lesions or ulcers   Data Reviewed: I have personally reviewed following labs and imaging studies  CBC:  Recent Labs Lab 05/04/17 1108 05/06/17 1343 05/07/17 0448 05/08/17 0821 05/09/17 0536 05/10/17 0544  WBC 4.5 2.8* 2.7* 2.3* 2.6* 2.1*  NEUTROABS 3.8 2.3  --   --   --   --   HGB 7.5* 7.4* 6.3* 9.8* 10.2* 10.2*  HCT 22.8* 22.0* 18.9* 28.3* 29.9* 30.6*  MCV 75.8* 73.3* 73.3* 77.3* 77.3* 77.9*  PLT 182 172 145* 143* 139* 818*   Basic Metabolic Panel:  Recent Labs Lab 05/06/17 1127 05/07/17 0448 05/08/17 0821 05/09/17 0536 05/10/17 0544  NA 143 145 143 146* 143  K 3.8 3.7 3.3* 3.3* 4.1  CL 111 115* 112* 114* 113*  CO2 23 24 23 24 23   GLUCOSE 48* 101* 240* 178* 253*  BUN 19 17 14 12 12   CREATININE 1.24 1.27* 1.22 1.13 1.09  CALCIUM 7.9* 7.5* 7.6* 7.8* 7.7*  MG  --   --   --  1.6*  --    GFR: Estimated Creatinine Clearance: 65.5 mL/min (by C-G formula based on SCr of 1.09 mg/dL). Liver Function Tests:  Recent Labs Lab 05/06/17 1127  AST 18  ALT 10*  ALKPHOS 41  BILITOT 0.5  PROT 6.3*  ALBUMIN 2.7*    Recent Labs Lab 05/06/17 1127  LIPASE 13   No results for input(s):  AMMONIA in the last 168 hours. Coagulation Profile:  Recent Labs Lab 05/06/17 1152  INR 2.28   Cardiac Enzymes: No results for input(s): CKTOTAL, CKMB, CKMBINDEX, TROPONINI in the last 168 hours. BNP (last 3 results) No results for input(s): PROBNP in the last 8760 hours. HbA1C: No results for input(s): HGBA1C in the last 72 hours. CBG:  Recent Labs Lab 05/09/17 1129 05/09/17 1620 05/09/17 2138 05/10/17 0826 05/10/17 1150  GLUCAP 205* 249* 232* 271* 244*   Lipid Profile: No  results for input(s): CHOL, HDL, LDLCALC, TRIG, CHOLHDL, LDLDIRECT in the last 72 hours. Thyroid Function Tests: No results for input(s): TSH, T4TOTAL, FREET4, T3FREE, THYROIDAB in the last 72 hours. Anemia Panel: No results for input(s): VITAMINB12, FOLATE, FERRITIN, TIBC, IRON, RETICCTPCT in the last 72 hours. Sepsis Labs:  Recent Labs Lab 05/06/17 1159  LATICACIDVEN 1.11    Recent Results (from the past 240 hour(s))  Urine culture     Status: Abnormal   Collection Time: 05/06/17 11:27 AM  Result Value Ref Range Status   Specimen Description URINE, CLEAN CATCH  Final   Special Requests Immunocompromised  Final   Culture >=100,000 COLONIES/mL KLEBSIELLA PNEUMONIAE (A)  Final   Report Status 05/08/2017 FINAL  Final   Organism ID, Bacteria KLEBSIELLA PNEUMONIAE (A)  Final      Susceptibility   Klebsiella pneumoniae - MIC*    AMPICILLIN RESISTANT Resistant     CEFAZOLIN <=4 SENSITIVE Sensitive     CEFTRIAXONE <=1 SENSITIVE Sensitive     CIPROFLOXACIN <=0.25 SENSITIVE Sensitive     GENTAMICIN <=1 SENSITIVE Sensitive     IMIPENEM <=0.25 SENSITIVE Sensitive     NITROFURANTOIN 64 INTERMEDIATE Intermediate     TRIMETH/SULFA <=20 SENSITIVE Sensitive     AMPICILLIN/SULBACTAM <=2 SENSITIVE Sensitive     PIP/TAZO <=4 SENSITIVE Sensitive     Extended ESBL NEGATIVE Sensitive     * >=100,000 COLONIES/mL KLEBSIELLA PNEUMONIAE  Culture, blood (routine x 2)     Status: Abnormal   Collection Time:  05/06/17 12:00 PM  Result Value Ref Range Status   Specimen Description BLOOD LEFT ANTECUBITAL  Final   Special Requests   Final    BOTTLES DRAWN AEROBIC AND ANAEROBIC Blood Culture adequate volume   Culture  Setup Time   Final    GRAM POSITIVE RODS AEROBIC BOTTLE ONLY CRITICAL RESULT CALLED TO, READ BACK BY AND VERIFIED WITH: J.GRIMSLEY PHARMD 05/08/17 0603 L.CHAMPION    Culture (A)  Final    DIPHTHEROIDS(CORYNEBACTERIUM SPECIES) Standardized susceptibility testing for this organism is not available. Performed at [facility], [place] [address], [place], [place] [address]    Report Status 05/10/2017 FINAL  Final  Blood Culture ID Panel (Reflexed)     Status: None   Collection Time: 05/06/17 12:00 PM  Result Value Ref Range Status   Enterococcus species NOT DETECTED NOT DETECTED Final   Vancomycin resistance NOT DETECTED NOT DETECTED Final   Listeria monocytogenes NOT DETECTED NOT DETECTED Final   Staphylococcus species NOT DETECTED NOT DETECTED Final   Staphylococcus aureus NOT DETECTED NOT DETECTED Final   Methicillin resistance NOT DETECTED NOT DETECTED Final   Streptococcus species NOT DETECTED NOT DETECTED Final   Streptococcus agalactiae NOT DETECTED NOT DETECTED Final   Streptococcus pneumoniae NOT DETECTED NOT DETECTED Final   Streptococcus pyogenes NOT DETECTED NOT DETECTED Final   Acinetobacter baumannii NOT DETECTED NOT DETECTED Final   Enterobacteriaceae species NOT DETECTED NOT DETECTED Final   Enterobacter cloacae complex NOT DETECTED NOT DETECTED Final   Escherichia coli NOT DETECTED NOT DETECTED Final   Klebsiella oxytoca NOT DETECTED NOT DETECTED Final   Klebsiella pneumoniae NOT DETECTED NOT DETECTED Final   Proteus species NOT DETECTED NOT DETECTED Final   Serratia marcescens NOT DETECTED NOT DETECTED Final   Carbapenem resistance NOT DETECTED NOT DETECTED Final   Haemophilus influenzae NOT DETECTED NOT DETECTED Final   Neisseria meningitidis NOT DETECTED  NOT DETECTED Final   Pseudomonas aeruginosa NOT DETECTED NOT DETECTED Final   Candida  albicans NOT DETECTED NOT DETECTED Final   Candida glabrata NOT DETECTED NOT DETECTED Final   Candida krusei NOT DETECTED NOT DETECTED Final   Candida parapsilosis NOT DETECTED NOT DETECTED Final   Candida tropicalis NOT DETECTED NOT DETECTED Final    Comment: Performed at [facility], [place] [address], [place], [place] [address]  Culture, blood (routine x 2)     Status: None (Preliminary result)   Collection Time: 05/06/17  1:00 PM  Result Value Ref Range Status   Specimen Description RIGHT ANTECUBITAL  Final   Special Requests   Final    BOTTLES DRAWN AEROBIC AND ANAEROBIC Blood Culture adequate volume   Culture   Final    NO GROWTH 4 DAYS Performed at [facility], [place] [address], [place], [place] [address]    Report Status PENDING  Incomplete  MRSA PCR Screening     Status: Abnormal   Collection Time: 05/06/17  4:17 PM  Result Value Ref Range Status   MRSA by PCR POSITIVE (A) NEGATIVE Final    Comment:        The GeneXpert MRSA Assay (FDA approved for NASAL specimens only), is one component of a comprehensive MRSA colonization surveillance program. It is not intended to diagnose MRSA infection nor to guide or monitor treatment for MRSA infections. RESULT CALLED TO, READ BACK BY AND VERIFIED WITH: CORCARAN,R RN 9.12.18 @2133  ZANDO,C   C difficile quick scan w PCR reflex     Status: None   Collection Time: 05/08/17  7:01 AM  Result Value Ref Range Status   C Diff antigen NEGATIVE NEGATIVE Final   C Diff toxin NEGATIVE NEGATIVE Final   C Diff interpretation No C. difficile detected.  Final  Gastrointestinal Panel by PCR , Stool     Status: None   Collection Time: 05/08/17  7:01 AM  Result Value Ref Range Status   Campylobacter species NOT DETECTED NOT DETECTED Final   Plesimonas shigelloides NOT DETECTED NOT DETECTED Final   Salmonella species NOT DETECTED NOT DETECTED  Final   Yersinia enterocolitica NOT DETECTED NOT DETECTED Final   Vibrio species NOT DETECTED NOT DETECTED Final   Vibrio cholerae NOT DETECTED NOT DETECTED Final   Enteroaggregative E coli (EAEC) NOT DETECTED NOT DETECTED Final   Enteropathogenic E coli (EPEC) NOT DETECTED NOT DETECTED Final   Enterotoxigenic E coli (ETEC) NOT DETECTED NOT DETECTED Final   Shiga like toxin producing E coli (STEC) NOT DETECTED NOT DETECTED Final   Shigella/Enteroinvasive E coli (EIEC) NOT DETECTED NOT DETECTED Final   Cryptosporidium NOT DETECTED NOT DETECTED Final   Cyclospora cayetanensis NOT DETECTED NOT DETECTED Final   Entamoeba histolytica NOT DETECTED NOT DETECTED Final   Giardia lamblia NOT DETECTED NOT DETECTED Final   Adenovirus F40/41 NOT DETECTED NOT DETECTED Final   Astrovirus NOT DETECTED NOT DETECTED Final   Norovirus GI/GII NOT DETECTED NOT DETECTED Final   Rotavirus A NOT DETECTED NOT DETECTED Final   Sapovirus (I, II, IV, and V) NOT DETECTED NOT DETECTED Final         Radiology Studies: US Abdomen Limited  Result Date: 05/09/2017 CLINICAL DATA:  Abdominal distention. EXAM: LIMITED ABDOMEN ULTRASOUND FOR ASCITES TECHNIQUE: Limited ultrasound survey for ascites was performed in all four abdominal quadrants. COMPARISON:  Abdominal ultrasound dated February 26, 2017. FINDINGS: Small ascites in the left lower quadrant. IMPRESSION: Small ascites in the left lower quadrant. Electronically Signed   By: Titus Dubin M.D.   On: 05/09/2017  08:50        Scheduled Meds: . carvedilol  6.25 mg Oral BID WC  . Chlorhexidine Gluconate Cloth  6 each Topical Q0600  . furosemide  40 mg Oral BID  . insulin aspart  0-5 Units Subcutaneous QHS  . insulin aspart  0-9 Units Subcutaneous TID WC  . iron polysaccharides  150 mg Oral Daily  . multivitamin with minerals  1 tablet Oral Daily  . mupirocin ointment  1 application Nasal BID  . pantoprazole  40 mg Oral BID  . pregabalin  75 mg Oral BID  .  saccharomyces boulardii  250 mg Oral BID  . tamsulosin  0.4 mg Oral QPC supper   Continuous Infusions: . sodium chloride       LOS: 4 days    Time spent: 35 minutes,     [name] [name], Cassie Freer, MD [facility] Pager [phone]  If 7PM-7AM, please contact night-coverage [email or web address] Password [email or web address] 05/10/2017, 2:55 PM

## 2017-05-11 ENCOUNTER — Other Ambulatory Visit: Payer: Medicare Other

## 2017-05-11 ENCOUNTER — Ambulatory Visit: Payer: Medicare Other

## 2017-05-11 DIAGNOSIS — R109 Unspecified abdominal pain: Secondary | ICD-10-CM

## 2017-05-11 LAB — BASIC METABOLIC PANEL
Anion gap: 9 (ref 5–15)
BUN: 12 mg/dL (ref 6–20)
CALCIUM: 7.9 mg/dL — AB (ref 8.9–10.3)
CO2: 22 mmol/L (ref 22–32)
Chloride: 112 mmol/L — ABNORMAL HIGH (ref 101–111)
Creatinine, Ser: 0.99 mg/dL (ref 0.61–1.24)
GFR calc Af Amer: >60 mL/min (ref >60)
GLUCOSE: 241 mg/dL — AB (ref 65–99)
Potassium: 3.6 mmol/L (ref 3.5–5.1)
Sodium: 143 mmol/L (ref 135–145)

## 2017-05-11 LAB — CBC
HCT: 30 % — ABNORMAL LOW (ref 39.0–52.0)
Hemoglobin: 10.3 g/dL — ABNORMAL LOW (ref 13.0–17.0)
MCH: 27.1 pg (ref 26.0–34.0)
MCHC: 34.3 g/dL (ref 30.0–36.0)
MCV: 78.9 fL (ref 78.0–100.0)
PLATELETS: 127 10*3/uL — AB (ref 150–400)
RBC: 3.8 MIL/uL — ABNORMAL LOW (ref 4.22–5.81)
RDW: 26.9 % — AB (ref 11.5–15.5)
WBC: 2 10*3/uL — ABNORMAL LOW (ref 4.0–10.5)

## 2017-05-11 LAB — URINALYSIS, ROUTINE W REFLEX MICROSCOPIC
Bilirubin Urine: NEGATIVE
Glucose, UA: >=500 mg/dL — AB
Ketones, ur: 5 mg/dL — AB
Leukocytes, UA: NEGATIVE
Nitrite: NEGATIVE
Protein, ur: 30 mg/dL — AB
Specific Gravity, Urine: 1.013 (ref 1.005–1.030)
pH: 5 (ref 5.0–8.0)

## 2017-05-11 LAB — CULTURE, BLOOD (ROUTINE X 2)
CULTURE: NO GROWTH
SPECIAL REQUESTS: ADEQUATE

## 2017-05-11 LAB — GLUCOSE, CAPILLARY
GLUCOSE-CAPILLARY: 189 mg/dL — AB (ref 65–99)
Glucose-Capillary: 243 mg/dL — ABNORMAL HIGH (ref 65–99)
Glucose-Capillary: 216 mg/dL — ABNORMAL HIGH (ref 65–99)
Glucose-Capillary: 186 mg/dL — ABNORMAL HIGH (ref 65–99)

## 2017-05-11 LAB — MAGNESIUM: Magnesium: 2.1 mg/dL (ref 1.7–2.4)

## 2017-05-11 MED ORDER — TRAMADOL HCL 50 MG PO TABS
50.0000 mg | ORAL_TABLET | Freq: Four times a day (QID) | ORAL | Status: DC | PRN
  Administered 2017-05-11 – 2017-05-13 (×4): 50 mg via ORAL
  Filled 2017-05-11 (×4): qty 1

## 2017-05-11 MED ORDER — FUROSEMIDE 40 MG PO TABS
40.0000 mg | ORAL_TABLET | Freq: Two times a day (BID) | ORAL | Status: DC
  Administered 2017-05-12: 40 mg via ORAL
  Filled 2017-05-11: qty 1

## 2017-05-11 MED ORDER — DEXTROSE 5 % IV SOLN
1.0000 g | INTRAVENOUS | Status: DC
  Administered 2017-05-11 – 2017-05-14 (×4): 1 g via INTRAVENOUS
  Filled 2017-05-11 (×4): qty 10

## 2017-05-11 MED ORDER — FUROSEMIDE 40 MG PO TABS: 40.0000 mg | ORAL_TABLET | Freq: Two times a day (BID) | ORAL | Status: DC

## 2017-05-11 MED ORDER — PANTOPRAZOLE SODIUM 40 MG IV SOLR
40.0000 mg | Freq: Two times a day (BID) | INTRAVENOUS | Status: DC
  Administered 2017-05-11 – 2017-05-15 (×8): 40 mg via INTRAVENOUS
  Filled 2017-05-11 (×8): qty 40

## 2017-05-11 NOTE — Care Management Note (Signed)
Case Management Note  Patient Details  Name: [name] [name] MRN: [id number] Date of Birth: 04/12/1946  Subjective/Objective:  71 y/o m admitted w/UTI/n/v. Hx: Met Pancreatic Ca. From home. GI/onc following.                 Action/Plan:d/c home.   Expected Discharge Date:   (UNKNOWN)               Expected Discharge Plan:  Home/Self Care  In-House Referral:     Discharge planning Services  CM Consult  Post Acute Care Choice:    Choice offered to:     DME Arranged:    DME Agency:     HH Arranged:    HH Agency:     Status of Service:  In process, will continue to follow  If discussed at Long Length of Stay Meetings, dates discussed:    Additional Comments:  Dessa Phi, RN 05/11/2017, 12:48 PM

## 2017-05-11 NOTE — Care Management Important Message (Signed)
Important Message  Patient Details IM Letter given to [name]/Case Manager to present to Patient Name: [name] [name] MRN: [id number] Date of Birth: June 02, 1946   Medicare Important Message Given:  Yes    [name] 05/11/2017, 9:41 AM

## 2017-05-11 NOTE — Progress Notes (Signed)
PROGRESS NOTE    [name] [name]  ZOX:[id number] DOB: Oct 16, 1945 DOA: 05/06/2017 PCP: Josetta Huddle, MD    Brief Narrative: [name] [name] is a 71 y.o. male with medical history significant for metastatic pancreatic cancer, chronic systolic/diastolic congestive heart failure, stage III chronic kidney disease and diabetes mellitus who was brought to the emergency room with complaints of hypoglycemia. Patient's wife who cares for him at home noted that in the last week, she started noticing that he had both elevated as well as big drops in his blood sugars, sometimes ranging as low as the 50s. His mentation did not change, but for the past day or so, he's been having nausea and vomiting. She states his bowel movements have not changed although in the last few days, he felt like his abdomen became much more distended. Patient became weaker and last night, became so weak that he had a near syncopal event scraping his forehead against the bathroom while walking was on the toilet. Patient was brought into the emergency room today via EMS  ED Course: In the emergency room, patient was found to have a large UTI. He was also found to have episodes of hypoglycemia. He was given amps of D50, but still CBG stable persistently in the 50s. Chest x-ray noted mild bilateral pleural effusions and a BNP was mildly elevated in the 500s. Patient's hemoglobin was at 7.4, stable from several days ago, white count 2.8 and rest of labs were unremarkable. Hospitalist call for further evaluation and admission    Assessment & Plan:   Principal Problem:   Hypoglycemia Active Problems:   NICM- EF 20-25% echo 6/14   Chronic combined systolic (congestive) and diastolic (congestive) heart failure (HCC)   HTN (hypertension)   BiV ICD (BS).  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 Resolved.   2-Hypoglycemia; hold tujeo.  Resolved. Will stop D 5 and monitor CBG>  SSI Hold levemir due to vomiting.   Acute on chronic combine systolic HF; Chest x ray with edema.  Received IV lasix. I will change lasix to daily due to diarrhea and soft BP.  Hold entresto  Echocardiogram done 10/14/16 noted EF of 20-25 percent plus grade 2 diastolic dysfunction and mild to moderate mitral regurg Hold lasix today due to vomiting   Diarrhea; improved. Related to chemo GI pathogen negative  C diff negative  Discontinue flagyl.   Hypokalemia; replete.  Hypomagnesemia; replaced.   Pancytopenia;  Related to chemo.  Dr Ammie Dalton will see patient.   PAF; hold xarelto due to anemia.   Anemia; acute blood loss. patient to received 2 units of PRBC>  GI consulted.  Discussed with Dr Benay Spice , he doesn't think that low hb is related to chemo. He is recommending GI evaluation.  GI planning endoscopy at some point.  Hb stable.   Dementia: Mild, stable, no evidence of acute behavioral disturbance  Stage 3 chronic kidney disease: Creatinine at 1.24. At baseline  Acute lower UTI: IV Rocephin.  Blood culture. One of two positive dysthyroid. Likely contaminant  Urine culture grew Klebsiella. Day 4/7 of antibiotic.resume ceftriaxone.   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 on chemo and radiation.  Resume pancreatic enzymes Abdominal distension; check US> no significant ascites.  Patient vomiting this morning. Change protonix to IV. Vomiting could be related to pancreatic cancer. Dr Benay Spice will see patient today and determine need for repeat CT scan.    DVT prophylaxis: hold xarelto due to low hemoglobin.  Code Status: full code.  Family Communication: wife at bedside.  Disposition  Plan: remain inpatient.   Consultants:   GI  Dr Benay Spice    Procedures: none  Antimicrobials: ceftriaxone Flagyl   Subjective: He vomiting this morning.  He report abdominal pain, nausea.     Objective: Vitals:   05/10/17 0717 05/10/17 1518 05/10/17 2124 05/11/17 0509  BP: 130/62 122/70 136/71 132/66  Pulse: 96 95 (!) 105 97  Resp: 16 16 16 18   Temp: 98.2 F (36.8 C) 98.8 F (37.1 C) 99 F (37.2 C) 98.8 F (37.1 C)  TempSrc: Oral Oral Oral Oral  SpO2: 100% 95% 97% 98%  Weight:      Height:        Intake/Output Summary (Last 24 hours) at 05/11/17 1313 Last data filed at 05/10/17 1809  Gross per 24 hour  Intake              287 ml  Output                0 ml  Net              287 ml   Filed Weights   05/06/17 1047 05/06/17 1603  Weight: 80.7 kg (178 lb) 85.7 kg (188 lb 15 oz)    Examination:  General exam: NAD  Respiratory system: CTA Cardiovascular system: S 1, S 2 RRR Gastrointestinal system: distended, soft, mild tenderness.  Central nervous system: non focal.  Extremities; Bilateral BKA Skin: No rashes, lesions or ulcers   Data Reviewed: I have personally reviewed following labs and imaging studies  CBC:  Recent Labs Lab 05/06/17 1343 05/07/17 0448 05/08/17 0821 05/09/17 0536 05/10/17 0544 05/11/17 0544  WBC 2.8* 2.7* 2.3* 2.6* 2.1* 2.0*  NEUTROABS 2.3  --   --   --   --   --   HGB 7.4* 6.3* 9.8* 10.2* 10.2* 10.3*  HCT 22.0* 18.9* 28.3* 29.9* 30.6* 30.0*  MCV 73.3* 73.3* 77.3* 77.3* 77.9* 78.9  PLT 172 145* 143* 139* 144* 166*   Basic Metabolic Panel:  Recent Labs Lab 05/07/17 0448 05/08/17 0821 05/09/17 0536 05/10/17 0544 05/11/17 0544  NA 145 143 146* 143 143  K 3.7 3.3* 3.3* 4.1 3.6  CL 115* 112* 114* 113* 112*  CO2 24 23 24 23 22   GLUCOSE 101* 240* 178* 253* 241*  BUN 17 14 12 12 12   CREATININE 1.27* 1.22 1.13 1.09 0.99  CALCIUM 7.5* 7.6* 7.8* 7.7* 7.9*  MG  --   --  1.6*  --  2.1   GFR: Estimated Creatinine  Clearance: 72.1 mL/min (by C-G formula based on SCr of 0.99 mg/dL). Liver Function Tests:  Recent Labs Lab 05/06/17 1127  AST 18  ALT 10*  ALKPHOS 41  BILITOT 0.5  PROT 6.3*  ALBUMIN 2.7*    Recent Labs Lab 05/06/17 1127  LIPASE 13   No results for input(s): AMMONIA in the last 168 hours. Coagulation Profile:  Recent Labs Lab 05/06/17 1152  INR 2.28   Cardiac Enzymes: No results for input(s): CKTOTAL, CKMB, CKMBINDEX, TROPONINI in the last 168 hours. BNP (last 3 results) No results for  input(s): PROBNP in the last 8760 hours. HbA1C: No results for input(s): HGBA1C in the last 72 hours. CBG:  Recent Labs Lab 05/10/17 1150 05/10/17 1723 05/10/17 2123 05/11/17 0743 05/11/17 1153  GLUCAP 244* 299* 304* 243* 216*   Lipid Profile: No results for input(s): CHOL, HDL, LDLCALC, TRIG, CHOLHDL, LDLDIRECT in the last 72 hours. Thyroid Function Tests: No results for input(s): TSH, T4TOTAL, FREET4, T3FREE, THYROIDAB in the last 72 hours. Anemia Panel: No results for input(s): VITAMINB12, FOLATE, FERRITIN, TIBC, IRON, RETICCTPCT in the last 72 hours. Sepsis Labs:  Recent Labs Lab 05/06/17 1159  LATICACIDVEN 1.11    Recent Results (from the past 240 hour(s))  Urine culture     Status: Abnormal   Collection Time: 05/06/17 11:27 AM  Result Value Ref Range Status   Specimen Description URINE, CLEAN CATCH  Final   Special Requests Immunocompromised  Final   Culture >=100,000 COLONIES/mL KLEBSIELLA PNEUMONIAE (A)  Final   Report Status 05/08/2017 FINAL  Final   Organism ID, Bacteria KLEBSIELLA PNEUMONIAE (A)  Final      Susceptibility   Klebsiella pneumoniae - MIC*    AMPICILLIN RESISTANT Resistant     CEFAZOLIN <=4 SENSITIVE Sensitive     CEFTRIAXONE <=1 SENSITIVE Sensitive     CIPROFLOXACIN <=0.25 SENSITIVE Sensitive     GENTAMICIN <=1 SENSITIVE Sensitive     IMIPENEM <=0.25 SENSITIVE Sensitive     NITROFURANTOIN 64 INTERMEDIATE Intermediate     TRIMETH/SULFA  <=20 SENSITIVE Sensitive     AMPICILLIN/SULBACTAM <=2 SENSITIVE Sensitive     PIP/TAZO <=4 SENSITIVE Sensitive     Extended ESBL NEGATIVE Sensitive     * >=100,000 COLONIES/mL KLEBSIELLA PNEUMONIAE  Culture, blood (routine x 2)     Status: Abnormal   Collection Time: 05/06/17 12:00 PM  Result Value Ref Range Status   Specimen Description BLOOD LEFT ANTECUBITAL  Final   Special Requests   Final    BOTTLES DRAWN AEROBIC AND ANAEROBIC Blood Culture adequate volume   Culture  Setup Time   Final    GRAM POSITIVE RODS AEROBIC BOTTLE ONLY CRITICAL RESULT CALLED TO, READ BACK BY AND VERIFIED WITH: J.GRIMSLEY PHARMD 05/08/17 0603 L.CHAMPION    Culture (A)  Final    DIPHTHEROIDS(CORYNEBACTERIUM SPECIES) Standardized susceptibility testing for this organism is not available. Performed at [facility], [place] [address], [place], [place] [address]    Report Status 05/10/2017 FINAL  Final  Blood Culture ID Panel (Reflexed)     Status: None   Collection Time: 05/06/17 12:00 PM  Result Value Ref Range Status   Enterococcus species NOT DETECTED NOT DETECTED Final   Vancomycin resistance NOT DETECTED NOT DETECTED Final   Listeria monocytogenes NOT DETECTED NOT DETECTED Final   Staphylococcus species NOT DETECTED NOT DETECTED Final   Staphylococcus aureus NOT DETECTED NOT DETECTED Final   Methicillin resistance NOT DETECTED NOT DETECTED Final   Streptococcus species NOT DETECTED NOT DETECTED Final   Streptococcus agalactiae NOT DETECTED NOT DETECTED Final   Streptococcus pneumoniae NOT DETECTED NOT DETECTED Final   Streptococcus pyogenes NOT DETECTED NOT DETECTED Final   Acinetobacter baumannii NOT DETECTED NOT DETECTED Final   Enterobacteriaceae species NOT DETECTED NOT DETECTED Final   Enterobacter cloacae complex NOT DETECTED NOT DETECTED Final   Escherichia coli NOT DETECTED NOT DETECTED Final   Klebsiella oxytoca NOT DETECTED NOT DETECTED Final   Klebsiella pneumoniae NOT DETECTED NOT  DETECTED Final   Proteus species NOT DETECTED NOT DETECTED Final   Serratia  marcescens NOT DETECTED NOT DETECTED Final   Carbapenem resistance NOT DETECTED NOT DETECTED Final   Haemophilus influenzae NOT DETECTED NOT DETECTED Final   Neisseria meningitidis NOT DETECTED NOT DETECTED Final   Pseudomonas aeruginosa NOT DETECTED NOT DETECTED Final   Candida albicans NOT DETECTED NOT DETECTED Final   Candida glabrata NOT DETECTED NOT DETECTED Final   Candida krusei NOT DETECTED NOT DETECTED Final   Candida parapsilosis NOT DETECTED NOT DETECTED Final   Candida tropicalis NOT DETECTED NOT DETECTED Final    Comment: Performed at [facility], [place] [address] [place], [place] [address]  Culture, blood (routine x 2)     Status: None (Preliminary result)   Collection Time: 05/06/17  1:00 PM  Result Value Ref Range Status   Specimen Description RIGHT ANTECUBITAL  Final   Special Requests   Final    BOTTLES DRAWN AEROBIC AND ANAEROBIC Blood Culture adequate volume   Culture   Final    NO GROWTH 4 DAYS Performed at [facility], [place] [address], [place], [place] [address]    Report Status PENDING  Incomplete  MRSA PCR Screening     Status: Abnormal   Collection Time: 05/06/17  4:17 PM  Result Value Ref Range Status   MRSA by PCR POSITIVE (A) NEGATIVE Final    Comment:        The GeneXpert MRSA Assay (FDA approved for NASAL specimens only), is one component of a comprehensive MRSA colonization surveillance program. It is not intended to diagnose MRSA infection nor to guide or monitor treatment for MRSA infections. RESULT CALLED TO, READ BACK BY AND VERIFIED WITH: CORCARAN,R RN 9.12.18 @2133  ZANDO,C   C difficile quick scan w PCR reflex     Status: None   Collection Time: 05/08/17  7:01 AM  Result Value Ref Range Status   C Diff antigen NEGATIVE NEGATIVE Final   C Diff toxin NEGATIVE NEGATIVE Final   C Diff interpretation No C. difficile detected.  Final  Gastrointestinal  Panel by PCR , Stool     Status: None   Collection Time: 05/08/17  7:01 AM  Result Value Ref Range Status   Campylobacter species NOT DETECTED NOT DETECTED Final   Plesimonas shigelloides NOT DETECTED NOT DETECTED Final   Salmonella species NOT DETECTED NOT DETECTED Final   Yersinia enterocolitica NOT DETECTED NOT DETECTED Final   Vibrio species NOT DETECTED NOT DETECTED Final   Vibrio cholerae NOT DETECTED NOT DETECTED Final   Enteroaggregative E coli (EAEC) NOT DETECTED NOT DETECTED Final   Enteropathogenic E coli (EPEC) NOT DETECTED NOT DETECTED Final   Enterotoxigenic E coli (ETEC) NOT DETECTED NOT DETECTED Final   Shiga like toxin producing E coli (STEC) NOT DETECTED NOT DETECTED Final   Shigella/Enteroinvasive E coli (EIEC) NOT DETECTED NOT DETECTED Final   Cryptosporidium NOT DETECTED NOT DETECTED Final   Cyclospora cayetanensis NOT DETECTED NOT DETECTED Final   Entamoeba histolytica NOT DETECTED NOT DETECTED Final   Giardia lamblia NOT DETECTED NOT DETECTED Final   Adenovirus F40/41 NOT DETECTED NOT DETECTED Final   Astrovirus NOT DETECTED NOT DETECTED Final   Norovirus GI/GII NOT DETECTED NOT DETECTED Final   Rotavirus A NOT DETECTED NOT DETECTED Final   Sapovirus (I, II, IV, and V) NOT DETECTED NOT DETECTED Final         Radiology Studies: No results found.      Scheduled Meds: . carvedilol  6.25 mg Oral BID WC  . Chlorhexidine Gluconate Cloth  6 each Topical Q0600  . furosemide  40 mg Oral BID  . insulin aspart  0-5 Units Subcutaneous QHS  . insulin aspart  0-9 Units Subcutaneous TID WC  . insulin detemir  10 Units Subcutaneous QHS  . iron polysaccharides  150 mg Oral Daily  . lipase/protease/amylase  36,000 Units Oral TID WC  . multivitamin with minerals  1 tablet Oral Daily  . mupirocin ointment  1 application Nasal BID  . pantoprazole (PROTONIX) IV  40 mg Intravenous Q12H  . pregabalin  75 mg Oral BID  . saccharomyces boulardii  250 mg Oral BID  .  tamsulosin  0.4 mg Oral QPC supper   Continuous Infusions: . cefTRIAXone (ROCEPHIN)  IV Stopped (05/11/17 1232)     LOS: 5 days    Time spent: 35 minutes,     Regalado, Cassie Freer, MD [facility] Pager [phone]  If 7PM-7AM, please contact night-coverage [email or web address] Password [email or web address] 05/11/2017, 1:13 PM

## 2017-05-11 NOTE — Progress Notes (Signed)
Spoke to patient/spouse about resources-provided w/Senior Investment banker, corporate guide-spouse very Patent attorney. Patient is a bilateral BKA-has prostethics/& w/c. No PT cons appropriate-spouse agree.

## 2017-05-11 NOTE — Progress Notes (Signed)
[facility] PROGRESS NOTE Subjective patient with metastatic pancreatic cancer being treated with salvage therapy by Dr Benay Spice. Admitted with nausea vomiting, significant UTI anemia. We were asked to see whether or not EGD would be appropriate. The patient's stools were positive that he had no overt bleeding and was anticoagulated on Xarelto. He notes that since being in the hospital his nausea is improved although he is still somewhat nauseous.  Objective: Vital signs in last 24 hours: Temp:  [98.8 F (37.1 C)-99 F (37.2 C)] 98.8 F (37.1 C) (09/17 0509) Pulse Rate:  [95-105] 97 (09/17 0509) Resp:  [16-18] 18 (09/17 0509) BP: (122-136)/(66-71) 132/66 (09/17 0509) SpO2:  [95 %-98 %] 98 % (09/17 0509) Last BM Date: 05/10/17  Intake/Output from previous day: 09/16 0701 - 09/17 0700 In: 287 [P.O.:237; IV Piggyback:50] Out: 620 [Urine:620] Intake/Output this shift: No intake/output data recorded.    Lab Results:  Recent Labs  05/08/17 0821 05/09/17 0536 05/10/17 0544 05/11/17 0544  WBC 2.3* 2.6* 2.1* 2.0*  HGB 9.8* 10.2* 10.2* 10.3*  HCT 28.3* 29.9* 30.6* 30.0*  PLT 143* 139* 144* 127*   BMET  Recent Labs  05/08/17 0821 05/09/17 0536 05/10/17 0544 05/11/17 0544  NA 143 146* 143 143  K 3.3* 3.3* 4.1 3.6  CL 112* 114* 113* 112*  CO2 23 24 23 22   CREATININE 1.22 1.13 1.09 0.99   LFT No results for input(s): PROT, AST, ALT, ALKPHOS, BILITOT, BILIDIR, IBILI in the last 72 hours. PT/INR No results for input(s): LABPROT, INR in the last 72 hours. PANCREAS No results for input(s): LIPASE in the last 72 hours.       Studies/Results: US Abdomen Limited  Result Date: 05/09/2017 CLINICAL DATA:  Abdominal distention. EXAM: LIMITED ABDOMEN ULTRASOUND FOR ASCITES TECHNIQUE: Limited ultrasound survey for ascites was performed in all four abdominal quadrants. COMPARISON:  Abdominal ultrasound dated February 26, 2017. FINDINGS: Small ascites in the left lower  quadrant. IMPRESSION: Small ascites in the left lower quadrant. Electronically Signed   By: Titus Dubin M.D.   On: 05/09/2017 08:50    Medications: I have reviewed the patient's current medications  Assessment and Plan: the patient's stools are positive but there is no overt bleeding. He feels better with treatment of his UTI in the use of PPI. Given his multiple problems including metastatic pancreatic cancer I'm not sure the EGD would add very much. We will do the procedure if oncology feels it would be helpful to them. Otherwise I would continue PPI and treatment of his UTI and see if he improves.   Halei Hanover JR,Stellan Vick L 05/11/2017, 8:02 AM  This note was created using voice recognition software. Minor errors may Have occurred unintentionally.  Pager: [phone] If no answer or after hours call [phone]

## 2017-05-11 NOTE — Progress Notes (Signed)
IP PROGRESS NOTE  Subjective:   [name] [name] continues to have abdominal discomfort, though he reports this is not severe. He continued radiation 05/08/2017.  Objective: Vital signs in last 24 hours: Blood pressure 115/66, pulse 92, temperature 99.1 F (37.3 C), temperature source Oral, resp. rate 18, height 5\' 9"  (1.753 m), weight 188 lb 15 oz (85.7 kg), SpO2 96 %.  Intake/Output from previous day: 09/16 0701 - 09/17 0700 In: 287 [P.O.:237; IV Piggyback:50] Out: 620 [Urine:620]  Physical Exam:  HEENT: No thrush or ulcers  Abdomen: Soft, nontender, no mass, no hepatosplenomegaly Skin: Palms without erythema    Lab Results:  Recent Labs  05/10/17 0544 05/11/17 0544  WBC 2.1* 2.0*  HGB 10.2* 10.3*  HCT 30.6* 30.0*  PLT 144* 127*    BMET  Recent Labs  05/10/17 0544 05/11/17 0544  NA 143 143  K 4.1 3.6  CL 113* 112*  CO2 23 22  GLUCOSE 253* 241*  BUN 12 12  CREATININE 1.09 0.99  CALCIUM 7.7* 7.9*     Medications: I have reviewed the patient's current medications.  Assessment/Plan: 1. Pancreas cancer, pancreas body mass on CT 12/08/2016  CT 12/08/2016 consistent with a pancreas body mass and liver lesions suspicious for metastases  EUS on 12/15/2016 confirmed a pancreas body mass, T3 N0, biopsy consistent with adenocarcinoma  Abdominal ultrasound 02/26/2017-pancreas mass, no liver lesions apparent  PET scan 03/19/2017-enlargement of pancreas mass with vascular encasement and new involvement of the tail of the pancreas, mildly hypermetabolic chest and porta hepatis nodes. Possible hypermetabolic activity at [id number] to represent a benign etiology  Initiation of concurrent radiation/Xeloda 04/13/2017  2. Acute cholecystitis February 2018, status post placement of a cholecystostomy tube  3. Diabetes  4. Congestive heart failure;hospitalized May 2018 with CHF  5. Peripheral vascular disease, status post bilateral BKA  6. Atrial  fibrillation  7. Microcytic anemia-progressive, iron deficiency?, Bleeding related to Xarelto or tumor involving the GI tract  Red cell transfusion 04/07/2017 and 05/07/2017  8. C. difficile colitis April 2018  9. Chronic renal failure  10. Anorectal hyperenhancing lesion noted on CT 12/08/2016  11. Admission 05/06/2017 with hypoglycemia  12. Klebsiella Urinary tract infection 05/06/2017   [name] [name] appears stable. He continues to have abdominal pain and nausea. His symptoms are likely related to the pancreas mass. The diarrhea may be related to Xeloda, radiation, or pancreatic insufficiency. The hemoglobin has stabilized after transfusion last week.  Recommendations: 1. Hold Xeloda until the diarrhea improves 2. Hold on upper endoscopy unless he has gross bleeding 3. Maximize Imodium for diarrhea and add Lomotil as needed 4. Add tramadol as needed for pain.      LOS: 5 days   Donneta Romberg, MD   05/11/2017, 4:03 PM

## 2017-05-11 NOTE — Progress Notes (Signed)
Inpatient Diabetes Program Recommendations  AACE/ADA: New Consensus Statement on Inpatient Glycemic Control (2015)  Target Ranges:  Prepandial:   less than 140 mg/dL      Peak postprandial:   less than 180 mg/dL (1-2 hours)      Critically ill patients:  140 - 180 mg/dL   Lab Results  Component Value Date   GLUCAP 304 (H) 05/10/2017   HGBA1C 8.0 (H) 05/06/2017    Review of Glycemic Control  Blood sugars > 180 mg/dL.  Inpatient Diabetes Program Recommendations:   Increase Levemir to 15 units QHS Add Novolog 4 units tidwc.  Continue to follow.  Thank you. Lorenda Peck, RD, LDN, CDE Inpatient Diabetes Coordinator [phone]

## 2017-05-12 ENCOUNTER — Ambulatory Visit
Admission: RE | Admit: 2017-05-12 | Discharge: 2017-05-12 | Disposition: A | Discharge status: Home / Self Care | Payer: Medicare Other | Source: Ambulatory Visit | Attending: Radiation Oncology | Admitting: Radiation Oncology

## 2017-05-12 ENCOUNTER — Ambulatory Visit: Payer: Medicare Other | Admitting: Oncology

## 2017-05-12 ENCOUNTER — Telehealth: Payer: Self-pay | Admitting: *Deleted

## 2017-05-12 LAB — CBC
HCT: 32.8 % — ABNORMAL LOW (ref 39.0–52.0)
Hemoglobin: 10.7 g/dL — ABNORMAL LOW (ref 13.0–17.0)
MCH: 25.7 pg — ABNORMAL LOW (ref 26.0–34.0)
MCHC: 32.6 g/dL (ref 30.0–36.0)
MCV: 78.8 fL (ref 78.0–100.0)
PLATELETS: 114 10*3/uL — AB (ref 150–400)
RBC: 4.16 MIL/uL — AB (ref 4.22–5.81)
RDW: 27.1 % — ABNORMAL HIGH (ref 11.5–15.5)
WBC: 3.4 10*3/uL — ABNORMAL LOW (ref 4.0–10.5)

## 2017-05-12 LAB — BASIC METABOLIC PANEL
Anion gap: 14 (ref 5–15)
BUN: 16 mg/dL (ref 6–20)
CO2: 21 mmol/L — ABNORMAL LOW (ref 22–32)
CREATININE: 1.23 mg/dL (ref 0.61–1.24)
Calcium: 7.9 mg/dL — ABNORMAL LOW (ref 8.9–10.3)
Chloride: 113 mmol/L — ABNORMAL HIGH (ref 101–111)
GFR calc Af Amer: >60 mL/min (ref >60)
GFR, EST NON AFRICAN AMERICAN: 56 mL/min — AB (ref >60)
Glucose, Bld: 260 mg/dL — ABNORMAL HIGH (ref 65–99)
Potassium: 3.9 mmol/L (ref 3.5–5.1)
SODIUM: 148 mmol/L — AB (ref 135–145)

## 2017-05-12 LAB — GLUCOSE, CAPILLARY
GLUCOSE-CAPILLARY: 229 mg/dL — AB (ref 65–99)
Glucose-Capillary: 242 mg/dL — ABNORMAL HIGH (ref 65–99)
Glucose-Capillary: 240 mg/dL — ABNORMAL HIGH (ref 65–99)

## 2017-05-12 MED ORDER — METOCLOPRAMIDE HCL 5 MG/ML IJ SOLN
5.0000 mg | Freq: Four times a day (QID) | INTRAMUSCULAR | Status: DC | PRN
  Administered 2017-05-13 (×2): 5 mg via INTRAVENOUS
  Filled 2017-05-12 (×2): qty 2

## 2017-05-12 MED ORDER — METOCLOPRAMIDE HCL 10 MG/10ML PO SOLN
10.0000 mg | Freq: Four times a day (QID) | ORAL | Status: DC | PRN
  Filled 2017-05-12: qty 10

## 2017-05-12 MED ORDER — METOCLOPRAMIDE HCL 5 MG/ML IJ SOLN
5.0000 mg | Freq: Four times a day (QID) | INTRAMUSCULAR | Status: DC
  Administered 2017-05-12: 5 mg via INTRAVENOUS
  Filled 2017-05-12: qty 2

## 2017-05-12 MED ORDER — SODIUM CHLORIDE 0.45 % IV SOLN
INTRAVENOUS | Status: DC
  Administered 2017-05-12: 17:00:00 via INTRAVENOUS

## 2017-05-12 NOTE — Progress Notes (Signed)
CSW acknowledged consult for nursing home placement. PT evaluation pending, CSW will see patient after PT recommendation is made.   [name] [name], [place] Social Worker [facility] Cell#: [phone]

## 2017-05-12 NOTE — Telephone Encounter (Signed)
Spoke with Musician  For  Pt 1411, he can have radiation , no EGD scheduled, thanked Therapist, sports, called and spoke with Miranda RT Therapist on [place]#4, informed patient can have radiation today 9:00 AM

## 2017-05-12 NOTE — Progress Notes (Signed)
[facility] Therapy Treatment Record Phone [phone]   Radiation Therapy was administered to [name] [name] on: 05/12/2017  1:45 PM and was treatment # 18 out of a planned course of 30 treatments.  Radiation Treatment  1). Beam photons with 6-10 energy and Photons 6-10 MeV  2). Brachytherapy None  3). Stereotactic Radiosurgery None  4). Other Radiation None     Byrd Terrero F Grayland Daisey, RT (T)

## 2017-05-12 NOTE — Progress Notes (Addendum)
PROGRESS NOTE    [name] [name]  MVH:[id number] DOB: 1945-10-31 DOA: 05/06/2017 PCP: Josetta Huddle, MD    Brief Narrative: [name] [name] is a 71 y.o. male with medical history significant for metastatic pancreatic cancer, chronic systolic/diastolic congestive heart failure, stage III chronic kidney disease and diabetes mellitus who was brought to the emergency room with complaints of hypoglycemia. Patient's wife who cares for him at home noted that in the last week, she started noticing that he had both elevated as well as big drops in his blood sugars, sometimes ranging as low as the 50s. His mentation did not change, but for the past day or so, he's been having nausea and vomiting. She states his bowel movements have not changed although in the last few days, he felt like his abdomen became much more distended. Patient became weaker and last night, became so weak that he had a near syncopal event scraping his forehead against the bathroom while walking was on the toilet. Patient was brought into the emergency room today via EMS  ED Course: In the emergency room, patient was found to have a large UTI. He was also found to have episodes of hypoglycemia. He was given amps of D50, but still CBG stable persistently in the 50s. Chest x-ray noted mild bilateral pleural effusions and a BNP was mildly elevated in the 500s. Patient's hemoglobin was at 7.4, stable from several days ago, white count 2.8 and rest of labs were unremarkable. Hospitalist call for further evaluation and admission    Assessment & Plan:   Principal Problem:   Hypoglycemia Active Problems:   NICM- EF 20-25% echo 6/14   Chronic combined systolic (congestive) and diastolic (congestive) heart failure (HCC)   HTN (hypertension)   BiV ICD (BS).  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 on chemo and radiation.  Resume pancreatic enzymes Abdominal distension; check US> no significant ascites.  Patient continue to vomit today. protonix to IV. Vomiting could be related to pancreatic cancer.  Dr Benay Spice will see patient  and determine need for repeat CT scan.  zofran PRN  Addendum;  Patient continue to vomit this afternoon. Will add IV fluids and PRN reglan.   Acute on chronic combine systolic HF; Chest x ray with edema.  Received IV lasix. I will change lasix to daily due to diarrhea and soft BP.  Hold entresto  Echocardiogram done 10/14/16 noted EF of 20-25 percent plus grade 2 diastolic dysfunction and mild to moderate mitral regurg Hold lasix today due to vomiting   Acute lower UTI: IV Rocephin.  Blood culture. One of two positive dysthyroid. Likely contaminant  Urine culture grew Klebsiella. Day 5/7 of antibiotic.resume ceftriaxone.    Diarrhea; improved. Related to chemo GI pathogen negative  C diff negative  Discontinue flagyl.   Hypokalemia; replete.  Hypomagnesemia; replaced.   Pancytopenia;  Related to chemo.  Dr Ammie Dalton will see patient.   PAF; hold xarelto due to anemia.   Anemia; acute blood loss. patient to received 2 units of PRBC>  GI consulted.  Discussed with Dr Benay Spice , he doesn't think that low hb is related  to chemo. He is recommending GI evaluation.  Per GI no plan for endoscopy  Hb stable.   Dementia: Mild, stable, no evidence of acute behavioral disturbance  Stage 3 chronic kidney disease: Creatinine at 1.24. At baseline  near syncope; Related to hypoglycemia. Resolved.   -Hypoglycemia; Patient use tujeo at home. Hold for now.  Resolved. Will stop D 5 and monitor CBG>  SSI  DVT prophylaxis: hold xarelto due to low hemoglobin.    Code Status: full code.  Family Communication: wife at bedside.  Disposition Plan: remain inpatient.   Consultants:   GI  Dr Benay Spice    Procedures: none  Antimicrobials: ceftriaxone Flagyl   Subjective: He is doing ok, he was able to eat breakfast today.  Patient then had another episode of vomiting     Objective: Vitals:   05/11/17 0509 05/11/17 1320 05/11/17 2100 05/12/17 0548  BP: 132/66 115/66 138/67 124/64  Pulse: 97 92 93 100  Resp: 18 18 18 18   Temp: 98.8 F (37.1 C) 99.1 F (37.3 C) 98.4 F (36.9 C) 98.3 F (36.8 C)  TempSrc: Oral Oral Oral Oral  SpO2: 98% 96% 98% 96%  Weight:      Height:        Intake/Output Summary (Last 24 hours) at 05/12/17 1431 Last data filed at 05/11/17 2355  Gross per 24 hour  Intake              240 ml  Output                0 ml  Net              240 ml   Filed Weights   05/06/17 1047 05/06/17 1603  Weight: 80.7 kg (178 lb) 85.7 kg (188 lb 15 oz)    Examination:  General exam: NAD Respiratory system: CTA Cardiovascular system: S 1, S 2 RRR Gastrointestinal system: distended, soft ,mild tender.  Central nervous system: non focal.  Extremities; Bilateral BKA Skin: No rashes, lesions or ulcers   Data Reviewed: I have personally reviewed following labs and imaging studies  CBC:  Recent Labs Lab 05/06/17 1343  05/08/17 0821 05/09/17 0536 05/10/17 0544 05/11/17 0544 05/12/17 0939  WBC 2.8*  < > 2.3* 2.6* 2.1* 2.0* 3.4*  NEUTROABS 2.3  --   --   --   --   --   --   HGB 7.4*  < > 9.8* 10.2* 10.2* 10.3* 10.7*  HCT 22.0*  < > 28.3* 29.9* 30.6* 30.0* 32.8*  MCV 73.3*  < > 77.3* 77.3* 77.9* 78.9 78.8  PLT 172  < > 143* 139* 144* 127* 114*  < > = values in this interval not displayed. Basic Metabolic Panel:  Recent Labs Lab 05/08/17 0821 05/09/17 0536 05/10/17 0544 05/11/17 0544 05/12/17 0939  NA 143 146* 143 143 148*  K 3.3* 3.3* 4.1 3.6 3.9  CL 112* 114* 113* 112* 113*  CO2 23 24 23 22  21*   GLUCOSE 240* 178* 253* 241* 260*  BUN 14 12 12 12 16   CREATININE 1.22 1.13 1.09 0.99 1.23  CALCIUM 7.6* 7.8* 7.7* 7.9* 7.9*  MG  --  1.6*  --  2.1  --    GFR: Estimated Creatinine Clearance: 58 mL/min (by C-G formula based on SCr of 1.23 mg/dL). Liver Function Tests:  Recent Labs Lab 05/06/17 1127  AST 18  ALT 10*  ALKPHOS 41  BILITOT 0.5  PROT 6.3*  ALBUMIN 2.7*  Recent Labs Lab 05/06/17 1127  LIPASE 13   No results for input(s): AMMONIA in the last 168 hours. Coagulation Profile:  Recent Labs Lab 05/06/17 1152  INR 2.28   Cardiac Enzymes: No results for input(s): CKTOTAL, CKMB, CKMBINDEX, TROPONINI in the last 168 hours. BNP (last 3 results) No results for input(s): PROBNP in the last 8760 hours. HbA1C: No results for input(s): HGBA1C in the last 72 hours. CBG:  Recent Labs Lab 05/11/17 1153 05/11/17 1701 05/11/17 2155 05/12/17 0804 05/12/17 1144  GLUCAP 216* 186* 189* 229* 242*   Lipid Profile: No results for input(s): CHOL, HDL, LDLCALC, TRIG, CHOLHDL, LDLDIRECT in the last 72 hours. Thyroid Function Tests: No results for input(s): TSH, T4TOTAL, FREET4, T3FREE, THYROIDAB in the last 72 hours. Anemia Panel: No results for input(s): VITAMINB12, FOLATE, FERRITIN, TIBC, IRON, RETICCTPCT in the last 72 hours. Sepsis Labs:  Recent Labs Lab 05/06/17 1159  LATICACIDVEN 1.11    Recent Results (from the past 240 hour(s))  Urine culture     Status: Abnormal   Collection Time: 05/06/17 11:27 AM  Result Value Ref Range Status   Specimen Description URINE, CLEAN CATCH  Final   Special Requests Immunocompromised  Final   Culture >=100,000 COLONIES/mL KLEBSIELLA PNEUMONIAE (A)  Final   Report Status 05/08/2017 FINAL  Final   Organism ID, Bacteria KLEBSIELLA PNEUMONIAE (A)  Final      Susceptibility   Klebsiella pneumoniae - MIC*    AMPICILLIN RESISTANT Resistant     CEFAZOLIN <=4 SENSITIVE Sensitive     CEFTRIAXONE <=1 SENSITIVE Sensitive      CIPROFLOXACIN <=0.25 SENSITIVE Sensitive     GENTAMICIN <=1 SENSITIVE Sensitive     IMIPENEM <=0.25 SENSITIVE Sensitive     NITROFURANTOIN 64 INTERMEDIATE Intermediate     TRIMETH/SULFA <=20 SENSITIVE Sensitive     AMPICILLIN/SULBACTAM <=2 SENSITIVE Sensitive     PIP/TAZO <=4 SENSITIVE Sensitive     Extended ESBL NEGATIVE Sensitive     * >=100,000 COLONIES/mL KLEBSIELLA PNEUMONIAE  Culture, blood (routine x 2)     Status: Abnormal   Collection Time: 05/06/17 12:00 PM  Result Value Ref Range Status   Specimen Description BLOOD LEFT ANTECUBITAL  Final   Special Requests   Final    BOTTLES DRAWN AEROBIC AND ANAEROBIC Blood Culture adequate volume   Culture  Setup Time   Final    GRAM POSITIVE RODS AEROBIC BOTTLE ONLY CRITICAL RESULT CALLED TO, READ BACK BY AND VERIFIED WITH: J.GRIMSLEY PHARMD 05/08/17 0603 L.CHAMPION    Culture (A)  Final    DIPHTHEROIDS(CORYNEBACTERIUM SPECIES) Standardized susceptibility testing for this organism is not available. Performed at [facility], [place] [address], [place], [place] [address]    Report Status 05/10/2017 FINAL  Final  Blood Culture ID Panel (Reflexed)     Status: None   Collection Time: 05/06/17 12:00 PM  Result Value Ref Range Status   Enterococcus species NOT DETECTED NOT DETECTED Final   Vancomycin resistance NOT DETECTED NOT DETECTED Final   Listeria monocytogenes NOT DETECTED NOT DETECTED Final   Staphylococcus species NOT DETECTED NOT DETECTED Final   Staphylococcus aureus NOT DETECTED NOT DETECTED Final   Methicillin resistance NOT DETECTED NOT DETECTED Final   Streptococcus species NOT DETECTED NOT DETECTED Final   Streptococcus agalactiae NOT DETECTED NOT DETECTED Final   Streptococcus pneumoniae NOT DETECTED NOT DETECTED Final   Streptococcus pyogenes NOT DETECTED NOT DETECTED Final   Acinetobacter baumannii NOT DETECTED NOT DETECTED Final   Enterobacteriaceae species  NOT DETECTED NOT DETECTED Final   Enterobacter cloacae  complex NOT DETECTED NOT DETECTED Final   Escherichia coli NOT DETECTED NOT DETECTED Final   Klebsiella oxytoca NOT DETECTED NOT DETECTED Final   Klebsiella pneumoniae NOT DETECTED NOT DETECTED Final   Proteus species NOT DETECTED NOT DETECTED Final   Serratia marcescens NOT DETECTED NOT DETECTED Final   Carbapenem resistance NOT DETECTED NOT DETECTED Final   Haemophilus influenzae NOT DETECTED NOT DETECTED Final   Neisseria meningitidis NOT DETECTED NOT DETECTED Final   Pseudomonas aeruginosa NOT DETECTED NOT DETECTED Final   Candida albicans NOT DETECTED NOT DETECTED Final   Candida glabrata NOT DETECTED NOT DETECTED Final   Candida krusei NOT DETECTED NOT DETECTED Final   Candida parapsilosis NOT DETECTED NOT DETECTED Final   Candida tropicalis NOT DETECTED NOT DETECTED Final    Comment: Performed at [facility], [place] [address], [place], [place] [address]  Culture, blood (routine x 2)     Status: None   Collection Time: 05/06/17  1:00 PM  Result Value Ref Range Status   Specimen Description RIGHT ANTECUBITAL  Final   Special Requests   Final    BOTTLES DRAWN AEROBIC AND ANAEROBIC Blood Culture adequate volume   Culture   Final    NO GROWTH 5 DAYS Performed at [facility], [place] [address], [place], [place] [address]    Report Status 05/11/2017 FINAL  Final  MRSA PCR Screening     Status: Abnormal   Collection Time: 05/06/17  4:17 PM  Result Value Ref Range Status   MRSA by PCR POSITIVE (A) NEGATIVE Final    Comment:        The GeneXpert MRSA Assay (FDA approved for NASAL specimens only), is one component of a comprehensive MRSA colonization surveillance program. It is not intended to diagnose MRSA infection nor to guide or monitor treatment for MRSA infections. RESULT CALLED TO, READ BACK BY AND VERIFIED WITH: CORCARAN,R RN 9.12.18 @2133  ZANDO,C   C difficile quick scan w PCR reflex     Status: None   Collection Time: 05/08/17  7:01 AM  Result Value Ref  Range Status   C Diff antigen NEGATIVE NEGATIVE Final   C Diff toxin NEGATIVE NEGATIVE Final   C Diff interpretation No C. difficile detected.  Final  Gastrointestinal Panel by PCR , Stool     Status: None   Collection Time: 05/08/17  7:01 AM  Result Value Ref Range Status   Campylobacter species NOT DETECTED NOT DETECTED Final   Plesimonas shigelloides NOT DETECTED NOT DETECTED Final   Salmonella species NOT DETECTED NOT DETECTED Final   Yersinia enterocolitica NOT DETECTED NOT DETECTED Final   Vibrio species NOT DETECTED NOT DETECTED Final   Vibrio cholerae NOT DETECTED NOT DETECTED Final   Enteroaggregative E coli (EAEC) NOT DETECTED NOT DETECTED Final   Enteropathogenic E coli (EPEC) NOT DETECTED NOT DETECTED Final   Enterotoxigenic E coli (ETEC) NOT DETECTED NOT DETECTED Final   Shiga like toxin producing E coli (STEC) NOT DETECTED NOT DETECTED Final   Shigella/Enteroinvasive E coli (EIEC) NOT DETECTED NOT DETECTED Final   Cryptosporidium NOT DETECTED NOT DETECTED Final   Cyclospora cayetanensis NOT DETECTED NOT DETECTED Final   Entamoeba histolytica NOT DETECTED NOT DETECTED Final   Giardia lamblia NOT DETECTED NOT DETECTED Final   Adenovirus F40/41 NOT DETECTED NOT DETECTED Final   Astrovirus NOT DETECTED NOT DETECTED Final   Norovirus GI/GII NOT DETECTED NOT DETECTED Final  Rotavirus A NOT DETECTED NOT DETECTED Final   Sapovirus (I, II, IV, and V) NOT DETECTED NOT DETECTED Final         Radiology Studies: No results found.      Scheduled Meds: . carvedilol  6.25 mg Oral BID WC  . Chlorhexidine Gluconate Cloth  6 each Topical Q0600  . insulin aspart  0-5 Units Subcutaneous QHS  . insulin aspart  0-9 Units Subcutaneous TID WC  . iron polysaccharides  150 mg Oral Daily  . lipase/protease/amylase  36,000 Units Oral TID WC  . multivitamin with minerals  1 tablet Oral Daily  . pantoprazole (PROTONIX) IV  40 mg Intravenous Q12H  . pregabalin  75 mg Oral BID  .  tamsulosin  0.4 mg Oral QPC supper   Continuous Infusions: . cefTRIAXone (ROCEPHIN)  IV Stopped (05/12/17 0943)     LOS: 6 days    Time spent: 35 minutes,     Izamar Linden, Cassie Freer, MD [facility] Pager [phone]  If 7PM-7AM, please contact night-coverage [email or web address] Password [facility] 05/12/2017, 2:31 PM

## 2017-05-12 NOTE — Telephone Encounter (Signed)
Wife Enid Derry called requesting a call back either from Dr. Benay Spice or desk nurse.  Per Enid Derry, she missed visit from md this am.  Enid Derry wanted to know what is going on with pt, will pt still have endoscopic procedure done, wife cannot manage pt at home by herself, and will need help with the care.   [facility]    Phone       [phone].

## 2017-05-12 NOTE — Progress Notes (Signed)
GI Discharge/Sign-Off Note  Subjective: patient somewhat less nauseated. Have discussed with Dr Benay Spice. Patient has metastatic pancreatic cancer. In spite of positive stool is hemoglobin is basically stable and he is not having any clinical bleeding. Felt that the better choice would be to treat him with empiric PPI therapy. EGD not indicated at this time.  Principal Problem:   Hypoglycemia Active Problems:   NICM- EF 20-25% echo 6/14   Chronic combined systolic (congestive) and diastolic (congestive) heart failure (HCC)   HTN (hypertension)   BiV ICD (BS).  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 [facility])   Acute lower UTI   Results for orders placed or performed during the hospital encounter of 05/06/17 (from the past 72 hour(s))  Glucose, capillary     Status: Abnormal   Collection Time: 05/09/17 11:29 AM  Result Value Ref Range   Glucose-Capillary 205 (H) 65 - 99 mg/dL  Glucose, capillary     Status: Abnormal   Collection Time: 05/09/17  4:20 PM  Result Value Ref Range   Glucose-Capillary 249 (H) 65 - 99 mg/dL  Glucose, capillary     Status: Abnormal   Collection Time: 05/09/17  9:38 PM  Result Value Ref Range   Glucose-Capillary 232 (H) 65 - 99 mg/dL  Occult blood card to lab, stool     Status: Abnormal   Collection Time: 05/09/17 10:22 PM  Result Value Ref Range   Fecal Occult Bld POSITIVE (A) NEGATIVE  CBC     Status: Abnormal   Collection Time: 05/10/17  5:44 AM  Result Value Ref Range   WBC 2.1 (L) 4.0 - 10.5 K/uL   RBC 3.93 (L) 4.22 - 5.81 MIL/uL   Hemoglobin 10.2 (L) 13.0 - 17.0 g/dL   HCT 30.6 (L) 39.0 - 52.0 %   MCV 77.9 (L) 78.0 - 100.0 fL   MCH 26.0 26.0 - 34.0 pg   MCHC 33.3 30.0 - 36.0 g/dL   RDW 26.0 (H) 11.5 - 15.5 %   Platelets 144 (L) 150 - 400 K/uL  Basic metabolic panel     Status: Abnormal   Collection Time: 05/10/17  5:44 AM  Result Value Ref Range   Sodium 143 135 - 145 mmol/L   Potassium 4.1 3.5 - 5.1 mmol/L    Comment: DELTA CHECK NOTED   Chloride 113 (H) 101 - 111 mmol/L   CO2 23 22 - 32 mmol/L   Glucose, Bld 253 (H) 65 - 99 mg/dL   BUN 12 6 - 20 mg/dL   Creatinine, Ser 1.09 0.61 - 1.24 mg/dL   Calcium 7.7 (L) 8.9 - 10.3 mg/dL   GFR calc non Af Amer >60 >60 mL/min   GFR calc Af Amer >60 >60 mL/min    Comment: (NOTE) The eGFR has been calculated using the CKD EPI equation. This calculation has not been validated in all clinical situations. eGFR's persistently <60 mL/min signify possible Chronic Kidney Disease.    Anion gap 7 5 - 15  Glucose, capillary     Status: Abnormal   Collection Time: 05/10/17  8:26 AM  Result Value Ref Range   Glucose-Capillary 271 (H) 65 - 99 mg/dL   Comment 1 Notify RN   Glucose, capillary  Status: Abnormal   Collection Time: 05/10/17 11:50 AM  Result Value Ref Range   Glucose-Capillary 244 (H) 65 - 99 mg/dL   Comment 1 Notify RN   Glucose, capillary     Status: Abnormal   Collection Time: 05/10/17  5:23 PM  Result Value Ref Range   Glucose-Capillary 299 (H) 65 - 99 mg/dL   Comment 1 Notify RN   Glucose, capillary     Status: Abnormal   Collection Time: 05/10/17  9:23 PM  Result Value Ref Range   Glucose-Capillary 304 (H) 65 - 99 mg/dL  CBC     Status: Abnormal   Collection Time: 05/11/17  5:44 AM  Result Value Ref Range   WBC 2.0 (L) 4.0 - 10.5 K/uL   RBC 3.80 (L) 4.22 - 5.81 MIL/uL   Hemoglobin 10.3 (L) 13.0 - 17.0 g/dL   HCT 30.0 (L) 39.0 - 52.0 %   MCV 78.9 78.0 - 100.0 fL   MCH 27.1 26.0 - 34.0 pg   MCHC 34.3 30.0 - 36.0 g/dL   RDW 26.9 (H) 11.5 - 15.5 %   Platelets 127 (L) 150 - 400 K/uL  Basic metabolic panel     Status: Abnormal   Collection Time: 05/11/17  5:44 AM  Result Value Ref Range   Sodium 143 135 - 145 mmol/L   Potassium  3.6 3.5 - 5.1 mmol/L   Chloride 112 (H) 101 - 111 mmol/L   CO2 22 22 - 32 mmol/L   Glucose, Bld 241 (H) 65 - 99 mg/dL   BUN 12 6 - 20 mg/dL   Creatinine, Ser 0.99 0.61 - 1.24 mg/dL   Calcium 7.9 (L) 8.9 - 10.3 mg/dL   GFR calc non Af Amer >60 >60 mL/min   GFR calc Af Amer >60 >60 mL/min    Comment: (NOTE) The eGFR has been calculated using the CKD EPI equation. This calculation has not been validated in all clinical situations. eGFR's persistently <60 mL/min signify possible Chronic Kidney Disease.    Anion gap 9 5 - 15  Magnesium     Status: None   Collection Time: 05/11/17  5:44 AM  Result Value Ref Range   Magnesium 2.1 1.7 - 2.4 mg/dL  Glucose, capillary     Status: Abnormal   Collection Time: 05/11/17  7:43 AM  Result Value Ref Range   Glucose-Capillary 243 (H) 65 - 99 mg/dL  Glucose, capillary     Status: Abnormal   Collection Time: 05/11/17 11:53 AM  Result Value Ref Range   Glucose-Capillary 216 (H) 65 - 99 mg/dL  Urinalysis, Routine w reflex microscopic     Status: Abnormal   Collection Time: 05/11/17  2:49 PM  Result Value Ref Range   Color, Urine YELLOW YELLOW   APPearance CLEAR CLEAR   Specific Gravity, Urine 1.013 1.005 - 1.030   pH 5.0 5.0 - 8.0   Glucose, UA >=500 (A) NEGATIVE mg/dL   Hgb urine dipstick SMALL (A) NEGATIVE   Bilirubin Urine NEGATIVE NEGATIVE   Ketones, ur 5 (A) NEGATIVE mg/dL   Protein, ur 30 (A) NEGATIVE mg/dL   Nitrite NEGATIVE NEGATIVE   Leukocytes, UA NEGATIVE NEGATIVE   RBC / HPF 0-5 0 - 5 RBC/hpf   WBC, UA 0-5 0 - 5 WBC/hpf   Bacteria, UA RARE (A) NONE SEEN   Squamous Epithelial / LPF 0-5 (A) NONE SEEN   Mucus PRESENT   Glucose, capillary     Status: Abnormal   Collection Time: 05/11/17  5:01 PM  Result Value Ref Range   Glucose-Capillary 186 (H) 65 - 99 mg/dL  Glucose, capillary     Status: Abnormal   Collection Time: 05/11/17  9:55 PM  Result Value Ref Range   Glucose-Capillary 189 (H) 65 - 99 mg/dL  Glucose, capillary      Status: Abnormal   Collection Time: 05/12/17  8:04 AM  Result Value Ref Range   Glucose-Capillary 229 (H) 65 - 99 mg/dL    No results found.  '@MEDSSCHEDLED'$ @  GI DISCHARGE PLANNING:  Diet:  Would advance diet is tolerated  Anticoagulation/Antiplatelet Rx Suggestions:   GI Medications: would treat patient empirically with PPI therapy. Please call us back of there is active bleeding.     GI FOLLOW UP:  Call [phone] if we need to see him again.      Laurence Spates, MD   Pager [phone] If no answer or after hours call [phone]

## 2017-05-12 NOTE — Telephone Encounter (Signed)
Left message on voicemail for pt's wife. Dr. Benay Spice will see him around [place] on 9/19 if she can be in the hospital room then. There is no plan at this time for endoscopy.

## 2017-05-13 ENCOUNTER — Ambulatory Visit
Admission: RE | Admit: 2017-05-13 | Discharge: 2017-05-13 | Disposition: A | Discharge status: Home / Self Care | Payer: Medicare Other | Source: Ambulatory Visit | Attending: Radiation Oncology | Admitting: Radiation Oncology

## 2017-05-13 DIAGNOSIS — D509 Iron deficiency anemia, unspecified: Secondary | ICD-10-CM

## 2017-05-13 DIAGNOSIS — R110 Nausea: Secondary | ICD-10-CM

## 2017-05-13 DIAGNOSIS — B961 Klebsiella pneumoniae [K. pneumoniae] as the cause of diseases classified elsewhere: Secondary | ICD-10-CM

## 2017-05-13 DIAGNOSIS — Z95810 Presence of automatic (implantable) cardiac defibrillator: Secondary | ICD-10-CM

## 2017-05-13 DIAGNOSIS — I509 Heart failure, unspecified: Secondary | ICD-10-CM

## 2017-05-13 LAB — CBC
HCT: 32.4 % — ABNORMAL LOW (ref 39.0–52.0)
HEMOGLOBIN: 10.8 g/dL — AB (ref 13.0–17.0)
MCH: 26.6 pg (ref 26.0–34.0)
MCHC: 33.3 g/dL (ref 30.0–36.0)
MCV: 79.8 fL (ref 78.0–100.0)
Platelets: 99 10*3/uL — ABNORMAL LOW (ref 150–400)
RBC: 4.06 MIL/uL — AB (ref 4.22–5.81)
RDW: 27.5 % — ABNORMAL HIGH (ref 11.5–15.5)
WBC: 2.6 10*3/uL — ABNORMAL LOW (ref 4.0–10.5)

## 2017-05-13 LAB — BASIC METABOLIC PANEL
Anion gap: 12 (ref 5–15)
BUN: 17 mg/dL (ref 6–20)
CHLORIDE: 114 mmol/L — AB (ref 101–111)
CO2: 21 mmol/L — AB (ref 22–32)
CREATININE: 1.22 mg/dL (ref 0.61–1.24)
Calcium: 7.9 mg/dL — ABNORMAL LOW (ref 8.9–10.3)
GFR calc Af Amer: >60 mL/min (ref >60)
GFR calc non Af Amer: 57 mL/min — ABNORMAL LOW (ref >60)
GLUCOSE: 236 mg/dL — AB (ref 65–99)
POTASSIUM: 3.9 mmol/L (ref 3.5–5.1)
SODIUM: 147 mmol/L — AB (ref 135–145)

## 2017-05-13 LAB — GLUCOSE, CAPILLARY
GLUCOSE-CAPILLARY: 245 mg/dL — AB (ref 65–99)
GLUCOSE-CAPILLARY: 279 mg/dL — AB (ref 65–99)
GLUCOSE-CAPILLARY: 246 mg/dL — AB (ref 65–99)
Glucose-Capillary: 246 mg/dL — ABNORMAL HIGH (ref 65–99)
Glucose-Capillary: 223 mg/dL — ABNORMAL HIGH (ref 65–99)

## 2017-05-13 MED ORDER — INSULIN GLARGINE 100 UNIT/ML ~~LOC~~ SOLN
12.0000 [IU] | Freq: Every day | SUBCUTANEOUS | Status: DC
  Administered 2017-05-13: 12 [IU] via SUBCUTANEOUS
  Filled 2017-05-13 (×2): qty 0.12

## 2017-05-13 MED ORDER — SODIUM CHLORIDE 0.45 % IV SOLN
INTRAVENOUS | Status: AC
  Administered 2017-05-13: 19:00:00 via INTRAVENOUS

## 2017-05-13 MED ORDER — OXYCODONE HCL 5 MG PO TABS: 5.0000 mg | ORAL_TABLET | Freq: Four times a day (QID) | ORAL | Status: DC | PRN

## 2017-05-13 NOTE — Progress Notes (Addendum)
IP PROGRESS NOTE  Subjective:   [name] [name] complains of abdominal pain and nausea. He is not eating much. He continues to have diarrhea 1-2 times per day.  Objective: Vital signs in last 24 hours: Blood pressure 137/69, pulse (!) 53, temperature 98.5 F (36.9 C), temperature source Oral, resp. rate 18, height 5\' 9"  (1.753 m), weight 188 lb 15 oz (85.7 kg), SpO2 94 %.  Intake/Output from previous day: 09/18 0701 - 09/19 0700 In: 870 [P.O.:120; I.V.:750] Out: 1400 [Urine:1400]  Physical Exam:   Abdomen: Soft, nontender, no mass, no hepatosplenomegaly  Lab Results:  Recent Labs  05/12/17 0939 05/13/17 0505  WBC 3.4* 2.6*  HGB 10.7* 10.8*  HCT 32.8* 32.4*  PLT 114* 99*    BMET  Recent Labs  05/12/17 0939 05/13/17 0505  NA 148* 147*  K 3.9 3.9  CL 113* 114*  CO2 21* 21*  GLUCOSE 260* 236*  BUN 16 17  CREATININE 1.23 1.22  CALCIUM 7.9* 7.9*     Medications: I have reviewed the patient's current medications.  Assessment/Plan: 1. Pancreas cancer, pancreas body mass on CT 12/08/2016  CT 12/08/2016 consistent with a pancreas body mass and liver lesions suspicious for metastases  EUS on 12/15/2016 confirmed a pancreas body mass, T3 N0, biopsy consistent with adenocarcinoma  Abdominal ultrasound 02/26/2017-pancreas mass, no liver lesions apparent  PET scan 03/19/2017-enlargement of pancreas mass with vascular encasement and new involvement of the tail of the pancreas, mildly hypermetabolic chest and porta hepatis nodes. Possible hypermetabolic activity at [id number]-[id number] to represent a benign etiology  Initiation of concurrent radiation/Xeloda 04/13/2017  2. Acute cholecystitis February 2018, status post placement of a cholecystostomy tube  3. Diabetes  4. Congestive heart failure;hospitalized May 2018 with CHF  5. Peripheral vascular disease, status post bilateral BKA  6. Atrial fibrillation  7. Microcytic anemia-progressive, iron deficiency?,  Bleeding related to Xarelto or tumor involving the GI tract  Red cell transfusion 04/07/2017 and 05/07/2017  8. C. difficile colitis April 2018  9. Chronic renal failure  10. Anorectal hyperenhancing lesion noted on CT 12/08/2016  11. Admission 05/06/2017 with hypoglycemia  12. Klebsiella Urinary tract infection 05/06/2017  13. Nausea/abdominal pain-most likely secondary to pancreas cancer   Mr. Keelin appears unchanged. He has limited oral intake secondary to nausea. He has abdominal pain. I suspect his symptoms are related to the pancreas cancer. The nausea could be in part secondary to radiation. The hemoglobin remained stable following a red cell transfusion earlier during this admission. I agree with no upper endoscopy.  I will discuss the prognosis and disposition options with [name] [name]. She is not present this morning. I think it will be difficult for her to care for him in the home. I discussed placement and hospice care with [name] [name]. He is agreeable to both.  Recommendations: 1. Hold Xeloda, decision on continuing radiation per [name] [name] 2. Tramadol, and stronger narcotic as needed for pain 3. Maximize Imodium for diarrhea and add Lomotil as needed 4. Consider placement and hospice care   I discussed the situation with [name] [name]. She is in agreement with nursing facility placement. They prefer [facility]. I will assess him in the morning on 05/14/2017 to decide on continuing radiation versus comfort care/hospice.   LOS: 7 days   Donneta Romberg, MD   05/13/2017, 12:44 PM

## 2017-05-13 NOTE — Progress Notes (Signed)
Inpatient Diabetes Program Recommendations  AACE/ADA: New Consensus Statement on Inpatient Glycemic Control (2015)  Target Ranges:  Prepandial:   less than 140 mg/dL      Peak postprandial:   less than 180 mg/dL (1-2 hours)      Critically ill patients:  140 - 180 mg/dL   Lab Results  Component Value Date   GLUCAP 246 (H) 05/13/2017   HGBA1C 8.0 (H) 05/06/2017    Review of Glycemic Control  Blood sugars > 200 mg/dL. Needs basal insulin.  Inpatient Diabetes Program Recommendations:   Add Levemir 15 units Q24H. Increase Novolog to 0-15 units Q6 while poor po intake.  Will continue to follow.   Thank you. Lorenda Peck, RD, LDN, CDE Inpatient Diabetes Coordinator [phone]

## 2017-05-13 NOTE — Progress Notes (Signed)
[facility] Therapy Treatment Record Phone [phone]   Radiation Therapy was administered to [name] [name] on: 05/13/2017  10:45 AM and was treatment #19 out of a planned course of 30 treatments.  Radiation Treatment  1). Beam photons with 6-10 energy and Photons 6-10 MeV  2). Brachytherapy None  3). Stereotactic Radiosurgery None  4). Other Radiation None     Jacques Earthly, RT (T)

## 2017-05-13 NOTE — Plan of Care (Signed)
Problem: Education: Goal: Knowledge of [facility] Education information/materials will improve Outcome: Completed/Met Date Met: 05/13/17 Plan of care discussed with spouse

## 2017-05-13 NOTE — Evaluation (Signed)
Physical Therapy Evaluation Patient Details Name: [name] [name] MRN: [id number] DOB: Jun 17, 1946 Today's Date: 05/13/2017   History of Present Illness  71 y.o. male with medical history significant for metastatic pancreatic cancer, chronic systolic/diastolic congestive heart failure, B BKA, stage III chronic kidney disease and diabetes mellitus who was brought to the emergency room with complaints of nausea, vomiting, weakness, hypoglycemia. Pt had near syncope just prior to admission. Dc of UTI, B pleural effusions.   Clinical Impression  Pt admitted with above diagnosis. Pt currently with functional limitations due to the deficits listed below (see PT Problem List). Mod assist to roll in bed, pt had abdominal pain and nausea so did not attempt further mobility. Pt's wife to bring in pt's BKA prostheses for mobility.  Pt will benefit from skilled PT to increase their independence and safety with mobility to allow discharge to the venue listed below.       Follow Up Recommendations SNF    Equipment Recommendations  None recommended by PT    Recommendations for Other Services       Precautions / Restrictions Precautions Precautions: Fall Precaution Comments: pt reports 2-3 falls in past 1 year Required Braces or Orthoses: Other Brace/Splint (B BKA prostheses) Restrictions Weight Bearing Restrictions: No      Mobility  Bed Mobility Overal bed mobility: Needs Assistance Bed Mobility: Rolling Rolling: Mod assist         General bed mobility comments: used rail, rolled to L with mod A for pericare  Transfers                 General transfer comment: not attempted -pt nauseous and had dry heaving, RN notified  Ambulation/Gait                Stairs            Wheelchair Mobility    Modified Rankin (Stroke Patients Only)       Balance                                             Pertinent Vitals/Pain Pain Assessment:  0-10 Pain Score: 6  Pain Location: abdomen Pain Descriptors / Indicators: Aching Pain Intervention(s): Limited activity within patient's tolerance;Monitored during session;Patient requesting pain meds-RN notified;RN gave pain meds during session    Home Living Family/patient expects to be discharged to:: Private residence Living Arrangements: Spouse/significant other Available Help at Discharge: Family;Available 24 hours/day Type of Home: House Home Access: Ramped entrance;Stairs to enter Entrance Stairs-Rails: Left   Home Layout: One level Home Equipment: Walker - 2 wheels;Wheelchair - manual;Tub bench;Bedside commode Additional Comments: pt does sponge bathing with assist from CNA    Prior Function Level of Independence: Needs assistance   Gait / Transfers Assistance Needed: Ambulates with RW and uses WC  ADL's / Homemaking Assistance Needed: sponge bathes        Hand Dominance   Dominant Hand: Right    Extremity/Trunk Assessment   Upper Extremity Assessment Upper Extremity Assessment: Overall WFL for tasks assessed    Lower Extremity Assessment Lower Extremity Assessment:  (B BKAs)       Communication   Communication: No difficulties  Cognition Arousal/Alertness: Awake/alert Behavior During Therapy: WFL for tasks assessed/performed Overall Cognitive Status: History of cognitive impairments - at baseline (dementia, wife provided most of hx)  General Comments      Exercises     Assessment/Plan    PT Assessment Patient needs continued PT services  PT Problem List Decreased activity tolerance;Decreased mobility;Pain       PT Treatment Interventions Gait training;Therapeutic exercise;Functional mobility training;Therapeutic activities;Patient/family education    PT Goals (Current goals can be found in the Care Plan section)  Acute Rehab PT Goals Patient Stated Goal: walk PT Goal Formulation: With  patient/family Time For Goal Achievement: 05/27/17 Potential to Achieve Goals: Fair    Frequency Min 3X/week   Barriers to discharge        Co-evaluation               AM-PAC PT "6 Clicks" Daily Activity  Outcome Measure Difficulty turning over in bed (including adjusting bedclothes, sheets and blankets)?: Unable Difficulty moving from lying on back to sitting on the side of the bed? : Unable Difficulty sitting down on and standing up from a chair with arms (e.g., wheelchair, bedside commode, etc,.)?: Unable Help needed moving to and from a bed to chair (including a wheelchair)?: Total Help needed walking in hospital room?: Total Help needed climbing 3-5 steps with a railing? : Total 6 Click Score: 6    End of Session   Activity Tolerance: Treatment limited secondary to medical complications (Comment) (nausea) Patient left: in bed;with call bell/phone within reach;with family/visitor present Nurse Communication: Mobility status PT Visit Diagnosis: Muscle weakness (generalized) (M62.81);Difficulty in walking, not elsewhere classified (R26.2)    Time: 5027-7412 PT Time Calculation (min) (ACUTE ONLY): 21 min   Charges:   PT Evaluation $PT Eval Low Complexity: 1 Low     PT G Codes:        [phone]  [name] [name] 05/13/2017, 1:44 PM

## 2017-05-13 NOTE — Progress Notes (Signed)
Patient is drowsy this am, easy to arouse. Pt is alert,oriented x4. Pt reports nausea is worsening, Zofran administered. Pt request to hold medications. VS 98, 16, 117/63, 95%ra. Will update provider and  continue to monitor

## 2017-05-13 NOTE — Progress Notes (Signed)
PROGRESS NOTE    [name] [name]  VVO:[id number] DOB: Sep 23, 1945 DOA: 05/06/2017 PCP: Josetta Huddle, MD    Brief Narrative: [name] [name] is a 71 y.o. male with medical history significant for metastatic pancreatic cancer, chronic systolic/diastolic congestive heart failure, stage III chronic kidney disease and diabetes mellitus who was brought to the emergency room with complaints of hypoglycemia. Patient's wife who cares for him at home noted that in the last week, she started noticing that he had both elevated as well as big drops in his blood sugars, sometimes ranging as low as the 50s. His mentation did not change, but for the past day or so, he's been having nausea and vomiting. She states his bowel movements have not changed although in the last few days, he felt like his abdomen became much more distended. Patient became weaker and last night, became so weak that he had a near syncopal event scraping his forehead against the bathroom while walking was on the toilet. Patient was brought into the emergency room today via EMS  ED Course: In the emergency room, patient was found to have a large UTI. He was also found to have episodes of hypoglycemia. He was given amps of D50, but still CBG stable persistently in the 50s. Chest x-ray noted mild bilateral pleural effusions and a BNP was mildly elevated in the 500s. Patient's hemoglobin was at 7.4, stable from several days ago, white count 2.8 and rest of labs were unremarkable. Hospitalist call for further evaluation and admission    Assessment & Plan:   Metastatic Pancreatic CA -Last CT from 4/178 and PET 7/18 concerning for liver mets and hilar and infrahilar lymph notes concerning for metastasis to chest -prognosis appears quite poor, Dr.Sherril to d/w family abt Hospice, may need Palliative meeting depending on that outcome, doubt he is in a condition to tolerate much more therapy -s/p Chemo and XRT -continues to have ongoing abd  pain, nausea and very poor PO intake  Acute on chronic combine systolic HF; Chest x ray with edema.  -s/p IV lasix, Hold entresto  -Echocardiogram done 10/14/16 noted EF of 20-25 percent plus grade 2 diastolic dysfunction and mild to moderate mitral regurg -due to vomiting and poor Po intake hold lasix  UTI: -continue IV Rocephin.  Blood culture. One of two positive dysthyroid. Likely contaminant  Urine culture grew Klebsiella. Day 6/7 of ceftriaxone.   Diarrhea; improved. Related to chemo GI pathogen negative  C diff negative  Discontinued flagyl.   Hypokalemia; repleted  Hypomagnesemia; replaced.   Pancytopenia;  -due to chemo  PAF;  -held xarelto due to anemia.   Anemia; due to underlying CA and Chemo -received 2 units of PRBC>  -Dr.Regalado discussed with Dr Benay Spice , he recommended GI evaluation, seen by GI recommended PPI and supportive care only -Hb stable.   Dementia: Mild, stable, no evidence of acute behavioral disturbance  Stage 3 chronic kidney disease: Creatinine at 1.24. At baseline  DM -Hypoglycemia; Patient uses tujeo at home. Hold for now.  -SSI now, hypoglycemia resolved -add lantus for now  DVT prophylaxis: hold xarelto due to low hemoglobin.  Code Status: full code.  Family Communication: none at bedside  Disposition Plan: to be determined  Consultants:   GI  Dr Benay Spice    Procedures: none  Antimicrobials: ceftriaxone/Flagyl   Subjective: -continues to be weak, very poor Po intake, some vomiting yesterday  Objective: Vitals:   05/12/17 0548 05/12/17 1509 05/12/17 1950 05/13/17 0444  BP: 124/64 124/66 [name])  112/50 137/69  Pulse: 100 (!) 48 (!) 51 (!) 53  Resp: 18 19 20 18   Temp: 98.3 F (36.8 C) 97.8 F (36.6 C) 99.5 F (37.5 C) 98.5 F (36.9 C)  TempSrc: Oral Oral Oral Oral  SpO2: 96% 96% 95% 94%  Weight:      Height:        Intake/Output Summary (Last 24 hours) at 05/13/17 1446 Last data filed at 05/13/17 0700  Gross  per 24 hour  Intake              750 ml  Output             1400 ml  Net             -650 ml   Filed Weights   05/06/17 1047 05/06/17 1603  Weight: 80.7 kg (178 lb) 85.7 kg (188 lb 15 oz)    Examination:  General exam: elderly chronically ill appearing, male, uncomfortable Respiratory system: decraesed BS at bases Cardiovascular system: S1S2/RRR tyachycardic Gastrointestinal system: soft, distended, mildly tender Central nervous system: grossly non focal Extremities; Bilateral BKA Skin: No rashes, lesions or ulcers   Data Reviewed: I have personally reviewed following labs and imaging studies  CBC:  Recent Labs Lab 05/09/17 0536 05/10/17 0544 05/11/17 0544 05/12/17 0939 05/13/17 0505  WBC 2.6* 2.1* 2.0* 3.4* 2.6*  HGB 10.2* 10.2* 10.3* 10.7* 10.8*  HCT 29.9* 30.6* 30.0* 32.8* 32.4*  MCV 77.3* 77.9* 78.9 78.8 79.8  PLT 139* 144* 127* 114* 99*   Basic Metabolic Panel:  Recent Labs Lab 05/09/17 0536 05/10/17 0544 05/11/17 0544 05/12/17 0939 05/13/17 0505  NA 146* 143 143 148* 147*  K 3.3* 4.1 3.6 3.9 3.9  CL 114* 113* 112* 113* 114*  CO2 24 23 22  21* 21*  GLUCOSE 178* 253* 241* 260* 236*  BUN 12 12 12 16 17   CREATININE 1.13 1.09 0.99 1.23 1.22  CALCIUM 7.8* 7.7* 7.9* 7.9* 7.9*  MG 1.6*  --  2.1  --   --    GFR: Estimated Creatinine Clearance: 58.5 mL/min (by C-G formula based on SCr of 1.22 mg/dL). Liver Function Tests: No results for input(s): AST, ALT, ALKPHOS, BILITOT, PROT, ALBUMIN in the last 168 hours. No results for input(s): LIPASE, AMYLASE in the last 168 hours. No results for input(s): AMMONIA in the last 168 hours. Coagulation Profile: No results for input(s): INR, PROTIME in the last 168 hours. Cardiac Enzymes: No results for input(s): CKTOTAL, CKMB, CKMBINDEX, TROPONINI in the last 168 hours. BNP (last 3 results) No results for input(s): PROBNP in the last 8760 hours. HbA1C: No results for input(s): HGBA1C in the last 72  hours. CBG:  Recent Labs Lab 05/12/17 0804 05/12/17 1144 05/12/17 1735 05/13/17 0814 05/13/17 1142  GLUCAP 229* 242* 240* 246* 223*   Lipid Profile: No results for input(s): CHOL, HDL, LDLCALC, TRIG, CHOLHDL, LDLDIRECT in the last 72 hours. Thyroid Function Tests: No results for input(s): TSH, T4TOTAL, FREET4, T3FREE, THYROIDAB in the last 72 hours. Anemia Panel: No results for input(s): VITAMINB12, FOLATE, FERRITIN, TIBC, IRON, RETICCTPCT in the last 72 hours. Sepsis Labs: No results for input(s): PROCALCITON, LATICACIDVEN in the last 168 hours.  Recent Results (from the past 240 hour(s))  Urine culture     Status: Abnormal   Collection Time: 05/06/17 11:27 AM  Result Value Ref Range Status   Specimen Description URINE, CLEAN CATCH  Final   Special Requests Immunocompromised  Final   Culture >=100,000 COLONIES/mL KLEBSIELLA PNEUMONIAE (  A)  Final   Report Status 05/08/2017 FINAL  Final   Organism ID, Bacteria KLEBSIELLA PNEUMONIAE (A)  Final      Susceptibility   Klebsiella pneumoniae - MIC*    AMPICILLIN RESISTANT Resistant     CEFAZOLIN <=4 SENSITIVE Sensitive     CEFTRIAXONE <=1 SENSITIVE Sensitive     CIPROFLOXACIN <=0.25 SENSITIVE Sensitive     GENTAMICIN <=1 SENSITIVE Sensitive     IMIPENEM <=0.25 SENSITIVE Sensitive     NITROFURANTOIN 64 INTERMEDIATE Intermediate     TRIMETH/SULFA <=20 SENSITIVE Sensitive     AMPICILLIN/SULBACTAM <=2 SENSITIVE Sensitive     PIP/TAZO <=4 SENSITIVE Sensitive     Extended ESBL NEGATIVE Sensitive     * >=100,000 COLONIES/mL KLEBSIELLA PNEUMONIAE  Culture, blood (routine x 2)     Status: Abnormal   Collection Time: 05/06/17 12:00 PM  Result Value Ref Range Status   Specimen Description BLOOD LEFT ANTECUBITAL  Final   Special Requests   Final    BOTTLES DRAWN AEROBIC AND ANAEROBIC Blood Culture adequate volume   Culture  Setup Time   Final    GRAM POSITIVE RODS AEROBIC BOTTLE ONLY CRITICAL RESULT CALLED TO, READ BACK BY AND  VERIFIED WITH: J.GRIMSLEY PHARMD 05/08/17 0603 L.CHAMPION    Culture (A)  Final    DIPHTHEROIDS(CORYNEBACTERIUM SPECIES) Standardized susceptibility testing for this organism is not available. Performed at [facility], [place] [address] [place], [place] [address]    Report Status 05/10/2017 FINAL  Final  Blood Culture ID Panel (Reflexed)     Status: None   Collection Time: 05/06/17 12:00 PM  Result Value Ref Range Status   Enterococcus species NOT DETECTED NOT DETECTED Final   Vancomycin resistance NOT DETECTED NOT DETECTED Final   Listeria monocytogenes NOT DETECTED NOT DETECTED Final   Staphylococcus species NOT DETECTED NOT DETECTED Final   Staphylococcus aureus NOT DETECTED NOT DETECTED Final   Methicillin resistance NOT DETECTED NOT DETECTED Final   Streptococcus species NOT DETECTED NOT DETECTED Final   Streptococcus agalactiae NOT DETECTED NOT DETECTED Final   Streptococcus pneumoniae NOT DETECTED NOT DETECTED Final   Streptococcus pyogenes NOT DETECTED NOT DETECTED Final   Acinetobacter baumannii NOT DETECTED NOT DETECTED Final   Enterobacteriaceae species NOT DETECTED NOT DETECTED Final   Enterobacter cloacae complex NOT DETECTED NOT DETECTED Final   Escherichia coli NOT DETECTED NOT DETECTED Final   Klebsiella oxytoca NOT DETECTED NOT DETECTED Final   Klebsiella pneumoniae NOT DETECTED NOT DETECTED Final   Proteus species NOT DETECTED NOT DETECTED Final   Serratia marcescens NOT DETECTED NOT DETECTED Final   Carbapenem resistance NOT DETECTED NOT DETECTED Final   Haemophilus influenzae NOT DETECTED NOT DETECTED Final   Neisseria meningitidis NOT DETECTED NOT DETECTED Final   Pseudomonas aeruginosa NOT DETECTED NOT DETECTED Final   Candida albicans NOT DETECTED NOT DETECTED Final   Candida glabrata NOT DETECTED NOT DETECTED Final   Candida krusei NOT DETECTED NOT DETECTED Final   Candida parapsilosis NOT DETECTED NOT DETECTED Final   Candida tropicalis NOT DETECTED  NOT DETECTED Final    Comment: Performed at [facility], [place] [address], [place], [place] [address]  Culture, blood (routine x 2)     Status: None   Collection Time: 05/06/17  1:00 PM  Result Value Ref Range Status   Specimen Description RIGHT ANTECUBITAL  Final   Special Requests   Final    BOTTLES DRAWN AEROBIC AND ANAEROBIC Blood Culture adequate volume  Culture   Final    NO GROWTH 5 DAYS Performed at [facility], [place] [address], [place], [place] [address]    Report Status 05/11/2017 FINAL  Final  MRSA PCR Screening     Status: Abnormal   Collection Time: 05/06/17  4:17 PM  Result Value Ref Range Status   MRSA by PCR POSITIVE (A) NEGATIVE Final    Comment:        The GeneXpert MRSA Assay (FDA approved for NASAL specimens only), is one component of a comprehensive MRSA colonization surveillance program. It is not intended to diagnose MRSA infection nor to guide or monitor treatment for MRSA infections. RESULT CALLED TO, READ BACK BY AND VERIFIED WITH: CORCARAN,R RN 9.12.18 @2133  ZANDO,C   C difficile quick scan w PCR reflex     Status: None   Collection Time: 05/08/17  7:01 AM  Result Value Ref Range Status   C Diff antigen NEGATIVE NEGATIVE Final   C Diff toxin NEGATIVE NEGATIVE Final   C Diff interpretation No C. difficile detected.  Final  Gastrointestinal Panel by PCR , Stool     Status: None   Collection Time: 05/08/17  7:01 AM  Result Value Ref Range Status   Campylobacter species NOT DETECTED NOT DETECTED Final   Plesimonas shigelloides NOT DETECTED NOT DETECTED Final   Salmonella species NOT DETECTED NOT DETECTED Final   Yersinia enterocolitica NOT DETECTED NOT DETECTED Final   Vibrio species NOT DETECTED NOT DETECTED Final   Vibrio cholerae NOT DETECTED NOT DETECTED Final   Enteroaggregative E coli (EAEC) NOT DETECTED NOT DETECTED Final   Enteropathogenic E coli (EPEC) NOT DETECTED NOT DETECTED Final   Enterotoxigenic E coli (ETEC) NOT  DETECTED NOT DETECTED Final   Shiga like toxin producing E coli (STEC) NOT DETECTED NOT DETECTED Final   Shigella/Enteroinvasive E coli (EIEC) NOT DETECTED NOT DETECTED Final   Cryptosporidium NOT DETECTED NOT DETECTED Final   Cyclospora cayetanensis NOT DETECTED NOT DETECTED Final   Entamoeba histolytica NOT DETECTED NOT DETECTED Final   Giardia lamblia NOT DETECTED NOT DETECTED Final   Adenovirus F40/41 NOT DETECTED NOT DETECTED Final   Astrovirus NOT DETECTED NOT DETECTED Final   Norovirus GI/GII NOT DETECTED NOT DETECTED Final   Rotavirus A NOT DETECTED NOT DETECTED Final   Sapovirus (I, II, IV, and V) NOT DETECTED NOT DETECTED Final         Radiology Studies: No results found.      Scheduled Meds: . carvedilol  6.25 mg Oral BID WC  . insulin aspart  0-5 Units Subcutaneous QHS  . insulin aspart  0-9 Units Subcutaneous TID WC  . lipase/protease/amylase  36,000 Units Oral TID WC  . multivitamin with minerals  1 tablet Oral Daily  . pantoprazole (PROTONIX) IV  40 mg Intravenous Q12H  . pregabalin  75 mg Oral BID  . tamsulosin  0.4 mg Oral QPC supper   Continuous Infusions: . sodium chloride 50 mL/hr at 05/12/17 1632  . cefTRIAXone (ROCEPHIN)  IV 1 g (05/13/17 1000)     LOS: 7 days    Time spent: 35 minutes,     Mishawn Didion, MD [facility] Pager [phone]  If 7PM-7AM, please contact night-coverage [email or web address] Password [email or web address] 05/13/2017, 2:46 PM

## 2017-05-14 ENCOUNTER — Encounter (HOSPITAL_COMMUNITY): Payer: Self-pay

## 2017-05-14 ENCOUNTER — Ambulatory Visit
Admission: RE | Admit: 2017-05-14 | Discharge: 2017-05-14 | Disposition: A | Discharge status: Home / Self Care | Payer: Medicare Other | Source: Ambulatory Visit | Attending: Radiation Oncology | Admitting: Radiation Oncology

## 2017-05-14 ENCOUNTER — Inpatient Hospital Stay (HOSPITAL_COMMUNITY): Payer: Medicare Other

## 2017-05-14 DIAGNOSIS — R140 Abdominal distension (gaseous): Secondary | ICD-10-CM

## 2017-05-14 LAB — GLUCOSE, CAPILLARY
GLUCOSE-CAPILLARY: 265 mg/dL — AB (ref 65–99)
GLUCOSE-CAPILLARY: 233 mg/dL — AB (ref 65–99)
Glucose-Capillary: 233 mg/dL — ABNORMAL HIGH (ref 65–99)
Glucose-Capillary: 248 mg/dL — ABNORMAL HIGH (ref 65–99)

## 2017-05-14 MED ORDER — IOPAMIDOL (ISOVUE-300) INJECTION 61%
30.0000 mL | Freq: Once | INTRAVENOUS | Status: AC | PRN
  Administered 2017-05-14: 30 mL via ORAL

## 2017-05-14 MED ORDER — PROMETHAZINE HCL 25 MG/ML IJ SOLN
6.2500 mg | Freq: Four times a day (QID) | INTRAMUSCULAR | Status: AC | PRN
  Administered 2017-05-14: 12:00:00 via INTRAVENOUS
  Filled 2017-05-14: qty 1

## 2017-05-14 MED ORDER — MORPHINE SULFATE 2 MG/ML IJ SOLN: 2.0000 mg | INTRAMUSCULAR | Status: DC | PRN

## 2017-05-14 MED ORDER — IOPAMIDOL (ISOVUE-300) INJECTION 61%: 15.0000 mL | Freq: Two times a day (BID) | INTRAVENOUS | Status: DC | PRN

## 2017-05-14 MED ORDER — MORPHINE SULFATE (PF) 4 MG/ML IV SOLN
2.0000 mg | INTRAVENOUS | Status: DC | PRN
  Administered 2017-05-14 – 2017-05-15 (×2): 2 mg via INTRAVENOUS
  Filled 2017-05-14 (×2): qty 1

## 2017-05-14 MED ORDER — IOPAMIDOL (ISOVUE-300) INJECTION 61%
INTRAVENOUS | Status: AC
  Filled 2017-05-14: qty 30

## 2017-05-14 MED ORDER — IOPAMIDOL (ISOVUE-300) INJECTION 61%
100.0000 mL | Freq: Once | INTRAVENOUS | Status: AC | PRN
  Administered 2017-05-14: 100 mL via INTRAVENOUS

## 2017-05-14 MED ORDER — IOPAMIDOL (ISOVUE-300) INJECTION 61%
INTRAVENOUS | Status: AC
  Administered 2017-05-14: 30 mL via ORAL
  Filled 2017-05-14: qty 30

## 2017-05-14 MED ORDER — IOPAMIDOL (ISOVUE-300) INJECTION 61%
INTRAVENOUS | Status: AC
  Filled 2017-05-14: qty 100

## 2017-05-14 MED ORDER — INSULIN GLARGINE 100 UNIT/ML ~~LOC~~ SOLN
20.0000 [IU] | Freq: Every day | SUBCUTANEOUS | Status: DC
  Administered 2017-05-14: 20 [IU] via SUBCUTANEOUS
  Filled 2017-05-14: qty 0.2

## 2017-05-14 NOTE — Progress Notes (Signed)
IP PROGRESS NOTE  Subjective:   [name] reports the diarrhea has resolved. He continues to have mid abdominal pain and nausea. He vomited this morning. His wife, daughter, and son are at the bedside.  Objective: Vital signs in last 24 hours: Blood pressure 140/72, pulse (!) 109, temperature 98.4 F (36.9 C), temperature source Oral, resp. rate 18, height 5\' 9"  (1.753 m), weight 188 lb 15 oz (85.7 kg), SpO2 98 %.  Intake/Output from previous day: 09/19 0701 - 09/20 0700 In: 770 [P.O.:120; I.V.:600; IV Piggyback:50] Out: 300 [Urine:300]  Physical Exam:   Abdomen: Soft, nontender, no mass, no hepatosplenomegaly  Lab Results:  Recent Labs  05/12/17 0939 05/13/17 0505  WBC 3.4* 2.6*  HGB 10.7* 10.8*  HCT 32.8* 32.4*  PLT 114* 99*    BMET  Recent Labs  05/12/17 0939 05/13/17 0505  NA 148* 147*  K 3.9 3.9  CL 113* 114*  CO2 21* 21*  GLUCOSE 260* 236*  BUN 16 17  CREATININE 1.23 1.22  CALCIUM 7.9* 7.9*     Medications: I have reviewed the patient's current medications.  Assessment/Plan: 1. Pancreas cancer, pancreas body mass on CT 12/08/2016  CT 12/08/2016 consistent with a pancreas body mass and liver lesions suspicious for metastases  EUS on 12/15/2016 confirmed a pancreas body mass, T3 N0, biopsy consistent with adenocarcinoma  Abdominal ultrasound 02/26/2017-pancreas mass, no liver lesions apparent  PET scan 03/19/2017-enlargement of pancreas mass with vascular encasement and new involvement of the tail of the pancreas, mildly hypermetabolic chest and porta hepatis nodes. Possible hypermetabolic activity at [id number] to represent a benign etiology  Initiation of concurrent radiation/Xeloda 04/13/2017  2. Acute cholecystitis February 2018, status post placement of a cholecystostomy tube  3. Diabetes  4. Congestive heart failure;hospitalized May 2018 with CHF  5. Peripheral vascular disease, status post bilateral BKA  6. Atrial  fibrillation  7. Microcytic anemia-progressive, iron deficiency?, Bleeding related to Xarelto or tumor involving the GI tract  Red cell transfusion 04/07/2017 and 05/07/2017  8. C. difficile colitis April 2018  9. Chronic renal failure  10. Anorectal hyperenhancing lesion noted on CT 12/08/2016  11. Admission 05/06/2017 with hypoglycemia  12. Klebsiella Urinary tract infection 05/06/2017  13. Nausea/abdominal pain-most likely secondary to pancreas cancer   [name] continues to have nausea and abdominal pain. His symptoms are most likely related to the pancreas cancer. I discussed the prognosis and treatment options with [name] and his family. He does not appear to be a candidate for a salvage palliative chemotherapy regimen. They understand no therapy will be curative. I recommend comfort/supportive care.  We decided to proceed with a restaging CT of the abdomen/pelvis. If disease progression is documented I will recommend Hospice care.  [name] are in agreement with a skilled nursing facility placement.  Recommendations: 1. Hold Xeloda, decision on continuing radiation per Dr. Lisbeth [name] 2. Tramadol and oxycodone for pain. I will add IV morphine as needed 3. Trial of Phenergan for nausea 4. CT abdomen today    LOS: 8 days   [name] Romberg, MD   05/14/2017, 8:01 AM

## 2017-05-14 NOTE — Clinical Social Work Note (Signed)
Clinical Social Work Assessment  Patient Details  Name: [name] [name] MRN: [id number] Date of Birth: 04/07/1946  Date of referral:  05/14/17               Reason for consult:  Facility Placement                Permission sought to share information with:  Chartered certified accountant granted to share information::  Yes, Verbal Permission Granted  Name::        Agency::     Relationship::     Contact Information:     Housing/Transportation Living arrangements for the past 2 months:  Single Family Home Source of Information:  Spouse Patient Interpreter Needed:  None Criminal Activity/Legal Involvement Pertinent to Current Situation/Hospitalization:  No - Comment as needed Significant Relationships:  Spouse Lives with:  Spouse Do you feel safe going back to the place where you live?   (PT recommending SNF) Need for family participation in patient care:  Yes (Comment)  Care giving concerns:  Patient from home with wife who primary caregiver. Prior to hospitalization patient was ambulating with the assistance of walker or using a wheel chair.    Social Worker assessment / plan:  CSW spoke with patient's wife at bedside regarding PT recommendation for SNF. Patient was asleep, patient's wife reported that patient had just received pain medication. Patient's wife reported that she is interested in SNF, specifically [facility] noting patient has been there in the past. CSW and patient's wife discussed ST rehab vs [facility] term care at [facility]. Patient's wife reported that she is hopeful that patient can participate in [place] re[facility] at [facility] and reported that she is not able to private pay for [facility] care if patient is not able to participate in [place] rehab. Patient's wife reported that she has attempted to apply for medicaid in the past for patient but was not willing to sign her home over. CSW provided patient's wife with [place] and [employer]. Patient's  wife reported that patient's alternate dc plan is for patient to return home with hospice.   CSW will complete FL2 and follow up with [facility] SNF.  CSW will continue to follow and assist with dc planning.  Employment status:  Retired Nurse, adult PT Recommendations:  [place] / Referral to community resources:  [place]  Patient/Family's Response to care:  Patient's wife agreeable to SNF for ST rehab.   Patient/Family's Understanding of and Emotional Response to Diagnosis, Current Treatment, and Prognosis:  Patient's wife verbalized understanding of patient's diagnosis and current treatment. Patient's wife reported that patient is having a scan done today and will learn more about prognosis. Patient's wife verbalized understanding that if patient is not able to participate in rehab that insurance will not provide authorization. Patient's wife verbalized plan to take patient home if he is unable to go to SNF for ST rehab. CSW inquired about patient's wife self care being the primary care giver for patient, patient's wife reported that she utilizes prayer as a form of self care. CSW positively affirmed patient's wife use of self care and provided emotional support.   Emotional Assessment Appearance:  Appears older than stated age Attitude/Demeanor/Rapport:  Unable to Assess Affect (typically observed):  Unable to Assess Orientation:    Alcohol / Substance use:  Not Applicable Psych involvement (Current and /or in the community):  No (Comment)  Discharge Needs  Concerns  to be addressed:  Care Coordination Readmission within the last 30 days:  No Current discharge risk:  None Barriers to Discharge:  Continued Medical Work up   [employer], [place] 05/14/2017, 2:05 PM

## 2017-05-14 NOTE — Progress Notes (Signed)
PROGRESS NOTE    [name] [name]  ZYS:[id number] DOB: 1945/09/29 DOA: 05/06/2017 PCP: Josetta Huddle, MD    Brief Narrative: [name] [name] is a 71 y.o. male with medical history significant for metastatic pancreatic cancer, chronic systolic/diastolic congestive heart failure, stage III chronic kidney disease and diabetes mellitus who was brought to the emergency room with complaints of hypoglycemia. In the emergency room, patient was found to have a large UTI. He was also found to have episodes of hypoglycemia.  Continues to decline, nausea/vomiting, poor Po intake  Assessment & Plan:   Metastatic Pancreatic CA -Last CT from 4/18 and PET 7/18 concerning for liver mets and hilar and infrahilar lymph notes concerning for metastasis to chest -prognosis appears quite poor, Dr.Sherril d/w family abt Hospice, today plan for CT abd pelvis per family request for prognostication and rule out other treatable pathologies -may need Palliative meeting depending on that outcome, doubt he is in a condition to tolerate much more therapy and wife seems to understand this -s/p Chemo and XRT -continues to have ongoing abd pain, nausea and very poor PO intake -await CT per Dr.Sherril before furthe rplans  Acute on chronic combine systolic HF; Chest x ray with edema.  -s/p IV lasix, Held entresto  -Echocardiogram done 10/14/16 noted EF of 20-25 percent plus grade 2 diastolic dysfunction and mild to moderate mitral regurg -due to vomiting and poor Po intake held lasix  UTI: -treated with IV Rocephin.  Blood culture. One of two positive dysthyroid. Likely contaminant  Urine culture grew Klebsiella. Day 7/7 of ceftriaxone, stop today  Diarrhea; improved. Related to chemo GI pathogen negative  C diff negative  Discontinued flagyl.   Hypokalemia; repleted  Hypomagnesemia; replaced.   Pancytopenia;  -due to chemo  PAF;  -held xarelto due to anemia.   Anemia; due to underlying CA and  Chemo -received 2 units of PRBC>  -Dr.Regalado discussed with Dr Benay Spice , he recommended GI evaluation, seen by GI recommended PPI and supportive care only -Hb stable.   Dementia: Mild, stable, no evidence of acute behavioral disturbance  Stage 3 chronic kidney disease: Creatinine at 1.24. At baseline  DM -Hypoglycemia; Patient uses tujeo at home. Hold for now.  -SSI now, hypoglycemia resolved -add lantus for now, increase dose  DVT prophylaxis: hold xarelto due to low hemoglobin.  Code Status: full code.  Family Communication: none at bedside  Disposition Plan: to be determined  Consultants:   GI  Dr Benay Spice    Procedures: none  Antimicrobials: ceftriaxone/Flagyl   Subjective: -poor Po intake, continues to have Nausea and Vomiting  Objective: Vitals:   05/13/17 1745 05/13/17 2127 05/14/17 0456 05/14/17 1440  BP: 120/78 130/73 140/72 (!) 119/58  Pulse: (!) 103 (!) 109 (!) 109 (!) 110  Resp: 18 18 18 18   Temp: 98.5 F (36.9 C) 98.9 F (37.2 C) 98.4 F (36.9 C) (!) 97.4 F (36.3 C)  TempSrc: Oral Oral Oral Oral  SpO2: 98% 99% 98%   Weight:      Height:        Intake/Output Summary (Last 24 hours) at 05/14/17 1528 Last data filed at 05/14/17 0930  Gross per 24 hour  Intake              250 ml  Output              301 ml  Net              -51 ml   [employer]  05/06/17 1047 05/06/17 1603  Weight: 80.7 kg (178 lb) 85.7 kg (188 lb 15 oz)    Examination:  Gen: elderly chronically ill appearing, male, uncomfortable HEENT: PERRLA, Neck supple, no JVD Lungs: decreased Bs at bases CVS: RRR,No Gallops,Rubs or new Murmurs Abd: soft, mild diffuse tenderness, mildly distended, BS present Extremities: B/L BKA Skin: forehead lesions/scabs    Data Reviewed: I have personally reviewed following labs and imaging studies  CBC:  Recent Labs Lab 05/09/17 0536 05/10/17 0544 05/11/17 0544 05/12/17 0939 05/13/17 0505  WBC 2.6* 2.1* 2.0* 3.4* 2.6*  HGB  10.2* 10.2* 10.3* 10.7* 10.8*  HCT 29.9* 30.6* 30.0* 32.8* 32.4*  MCV 77.3* 77.9* 78.9 78.8 79.8  PLT 139* 144* 127* 114* 99*   Basic Metabolic Panel:  Recent Labs Lab 05/09/17 0536 05/10/17 0544 05/11/17 0544 05/12/17 0939 05/13/17 0505  NA 146* 143 143 148* 147*  K 3.3* 4.1 3.6 3.9 3.9  CL 114* 113* 112* 113* 114*  CO2 24 23 22  21* 21*  GLUCOSE 178* 253* 241* 260* 236*  BUN 12 12 12 16 17   CREATININE 1.13 1.09 0.99 1.23 1.22  CALCIUM 7.8* 7.7* 7.9* 7.9* 7.9*  MG 1.6*  --  2.1  --   --    GFR: Estimated Creatinine Clearance: 58.5 mL/min (by C-G formula based on SCr of 1.22 mg/dL). Liver Function Tests: No results for input(s): AST, ALT, ALKPHOS, BILITOT, PROT, ALBUMIN in the last 168 hours. No results for input(s): LIPASE, AMYLASE in the last 168 hours. No results for input(s): AMMONIA in the last 168 hours. Coagulation Profile: No results for input(s): INR, PROTIME in the last 168 hours. Cardiac Enzymes: No results for input(s): CKTOTAL, CKMB, CKMBINDEX, TROPONINI in the last 168 hours. BNP (last 3 results) No results for input(s): PROBNP in the last 8760 hours. HbA1C: No results for input(s): HGBA1C in the last 72 hours. CBG:  Recent Labs Lab 05/13/17 1142 05/13/17 1744 05/13/17 2104 05/14/17 0756 05/14/17 1157  GLUCAP 223* 279* 246* 265* 233*   Lipid Profile: No results for input(s): CHOL, HDL, LDLCALC, TRIG, CHOLHDL, LDLDIRECT in the last 72 hours. Thyroid Function Tests: No results for input(s): TSH, T4TOTAL, FREET4, T3FREE, THYROIDAB in the last 72 hours. Anemia Panel: No results for input(s): VITAMINB12, FOLATE, FERRITIN, TIBC, IRON, RETICCTPCT in the last 72 hours. Sepsis Labs: No results for input(s): PROCALCITON, LATICACIDVEN in the last 168 hours.  Recent Results (from the past 240 hour(s))  Urine culture     Status: Abnormal   Collection Time: 05/06/17 11:27 AM  Result Value Ref Range Status   Specimen Description URINE, CLEAN CATCH  Final    Special Requests Immunocompromised  Final   Culture >=100,000 COLONIES/mL KLEBSIELLA PNEUMONIAE (A)  Final   Report Status 05/08/2017 FINAL  Final   Organism ID, Bacteria KLEBSIELLA PNEUMONIAE (A)  Final      Susceptibility   Klebsiella pneumoniae - MIC*    AMPICILLIN RESISTANT Resistant     CEFAZOLIN <=4 SENSITIVE Sensitive     CEFTRIAXONE <=1 SENSITIVE Sensitive     CIPROFLOXACIN <=0.25 SENSITIVE Sensitive     GENTAMICIN <=1 SENSITIVE Sensitive     IMIPENEM <=0.25 SENSITIVE Sensitive     NITROFURANTOIN 64 INTERMEDIATE Intermediate     TRIMETH/SULFA <=20 SENSITIVE Sensitive     AMPICILLIN/SULBACTAM <=2 SENSITIVE Sensitive     PIP/TAZO <=4 SENSITIVE Sensitive     Extended ESBL NEGATIVE Sensitive     * >=100,000 COLONIES/mL KLEBSIELLA PNEUMONIAE  Culture, blood (routine x 2)  Status: Abnormal   Collection Time: 05/06/17 12:00 PM  Result Value Ref Range Status   Specimen Description BLOOD LEFT ANTECUBITAL  Final   Special Requests   Final    BOTTLES DRAWN AEROBIC AND ANAEROBIC Blood Culture adequate volume   Culture  Setup Time   Final    GRAM POSITIVE RODS AEROBIC BOTTLE ONLY CRITICAL RESULT CALLED TO, READ BACK BY AND VERIFIED WITH: J.GRIMSLEY PHARMD 05/08/17 0603 L.CHAMPION    Culture (A)  Final    DIPHTHEROIDS(CORYNEBACTERIUM SPECIES) Standardized susceptibility testing for this organism is not available. Performed at [facility], [place] [address] [place], [place] [address]    Report Status 05/10/2017 FINAL  Final  Blood Culture ID Panel (Reflexed)     Status: None   Collection Time: 05/06/17 12:00 PM  Result Value Ref Range Status   Enterococcus species NOT DETECTED NOT DETECTED Final   Vancomycin resistance NOT DETECTED NOT DETECTED Final   Listeria monocytogenes NOT DETECTED NOT DETECTED Final   Staphylococcus species NOT DETECTED NOT DETECTED Final   Staphylococcus aureus NOT DETECTED NOT DETECTED Final   Methicillin resistance NOT DETECTED NOT DETECTED Final     Streptococcus species NOT DETECTED NOT DETECTED Final   Streptococcus agalactiae NOT DETECTED NOT DETECTED Final   Streptococcus pneumoniae NOT DETECTED NOT DETECTED Final   Streptococcus pyogenes NOT DETECTED NOT DETECTED Final   Acinetobacter baumannii NOT DETECTED NOT DETECTED Final   Enterobacteriaceae species NOT DETECTED NOT DETECTED Final   Enterobacter cloacae complex NOT DETECTED NOT DETECTED Final   Escherichia coli NOT DETECTED NOT DETECTED Final   Klebsiella oxytoca NOT DETECTED NOT DETECTED Final   Klebsiella pneumoniae NOT DETECTED NOT DETECTED Final   Proteus species NOT DETECTED NOT DETECTED Final   Serratia marcescens NOT DETECTED NOT DETECTED Final   Carbapenem resistance NOT DETECTED NOT DETECTED Final   Haemophilus influenzae NOT DETECTED NOT DETECTED Final   Neisseria meningitidis NOT DETECTED NOT DETECTED Final   Pseudomonas aeruginosa NOT DETECTED NOT DETECTED Final   Candida albicans NOT DETECTED NOT DETECTED Final   Candida glabrata NOT DETECTED NOT DETECTED Final   Candida krusei NOT DETECTED NOT DETECTED Final   Candida parapsilosis NOT DETECTED NOT DETECTED Final   Candida tropicalis NOT DETECTED NOT DETECTED Final    Comment: Performed at [facility], [place] [address], [place], [place] [address]  Culture, blood (routine x 2)     Status: None   Collection Time: 05/06/17  1:00 PM  Result Value Ref Range Status   Specimen Description RIGHT ANTECUBITAL  Final   Special Requests   Final    BOTTLES DRAWN AEROBIC AND ANAEROBIC Blood Culture adequate volume   Culture   Final    NO GROWTH 5 DAYS Performed at [facility], [place] [address] [place], [place] [address]    Report Status 05/11/2017 FINAL  Final  MRSA PCR Screening     Status: Abnormal   Collection Time: 05/06/17  4:17 PM  Result Value Ref Range Status   MRSA by PCR POSITIVE (A) NEGATIVE Final    Comment:        The GeneXpert MRSA Assay (FDA approved for NASAL specimens only), is one  component of a comprehensive MRSA colonization surveillance program. It is not intended to diagnose MRSA infection nor to guide or monitor treatment for MRSA infections. RESULT CALLED TO, READ BACK BY AND VERIFIED WITH: CORCARAN,R RN 9.12.18 @2133  ZANDO,C   C difficile quick scan w PCR reflex  Status: None   Collection Time: 05/08/17  7:01 AM  Result Value Ref Range Status   C Diff antigen NEGATIVE NEGATIVE Final   C Diff toxin NEGATIVE NEGATIVE Final   C Diff interpretation No C. difficile detected.  Final  Gastrointestinal Panel by PCR , Stool     Status: None   Collection Time: 05/08/17  7:01 AM  Result Value Ref Range Status   Campylobacter species NOT DETECTED NOT DETECTED Final   Plesimonas shigelloides NOT DETECTED NOT DETECTED Final   Salmonella species NOT DETECTED NOT DETECTED Final   Yersinia enterocolitica NOT DETECTED NOT DETECTED Final   Vibrio species NOT DETECTED NOT DETECTED Final   Vibrio cholerae NOT DETECTED NOT DETECTED Final   Enteroaggregative E coli (EAEC) NOT DETECTED NOT DETECTED Final   Enteropathogenic E coli (EPEC) NOT DETECTED NOT DETECTED Final   Enterotoxigenic E coli (ETEC) NOT DETECTED NOT DETECTED Final   Shiga like toxin producing E coli (STEC) NOT DETECTED NOT DETECTED Final   Shigella/Enteroinvasive E coli (EIEC) NOT DETECTED NOT DETECTED Final   Cryptosporidium NOT DETECTED NOT DETECTED Final   Cyclospora cayetanensis NOT DETECTED NOT DETECTED Final   Entamoeba histolytica NOT DETECTED NOT DETECTED Final   Giardia lamblia NOT DETECTED NOT DETECTED Final   Adenovirus F40/41 NOT DETECTED NOT DETECTED Final   Astrovirus NOT DETECTED NOT DETECTED Final   Norovirus GI/GII NOT DETECTED NOT DETECTED Final   Rotavirus A NOT DETECTED NOT DETECTED Final   Sapovirus (I, II, IV, and V) NOT DETECTED NOT DETECTED Final         Radiology Studies: No results found.      Scheduled Meds: . carvedilol  6.25 mg Oral BID WC  . insulin aspart   0-5 Units Subcutaneous QHS  . insulin aspart  0-9 Units Subcutaneous TID WC  . insulin glargine  12 Units Subcutaneous QHS  . iopamidol      . iopamidol      . lipase/protease/amylase  36,000 Units Oral TID WC  . multivitamin with minerals  1 tablet Oral Daily  . pantoprazole (PROTONIX) IV  40 mg Intravenous Q12H  . pregabalin  75 mg Oral BID  . tamsulosin  0.4 mg Oral QPC supper   Continuous Infusions:    LOS: 8 days    Time spent: 35 minutes,     Chelsee Hosie, MD [facility] Pager [phone]  If 7PM-7AM, please contact night-coverage [email or web address] Password [email or web address] 05/14/2017, 3:28 PM

## 2017-05-14 NOTE — Progress Notes (Signed)
PT Cancellation Note  Patient Details Name: [name] [name] MRN: [id number] DOB: 24-Jun-1946   Cancelled Treatment:    Reason Eval/Treat Not Completed: Fatigue/lethargy limiting ability to participate (pt lethargic (due to morphine per wife), will attempt later today. )   Philomena Doheny 05/14/2017, 9:27 AM  [phone]

## 2017-05-14 NOTE — Progress Notes (Signed)
PT Cancellation Note  Patient Details Name: [name] [name] MRN: [id number] DOB: 04-Apr-1946   Cancelled Treatment:    Reason Eval/Treat Not Completed: Medical issues which prohibited therapy (per RN and pt's wife, pt recently vomited, now sleeping 2* nausea medication. Will check back tomorrow. )   Philomena Doheny 05/14/2017, 1:22 PM  [phone]

## 2017-05-14 NOTE — Care Management Important Message (Signed)
Important Message  Patient Details  Name: [name] [name] MRN: [id number] Date of Birth: 04-Jul-1946   Medicare Important Message Given:  Yes    [name] 05/14/2017, 11:39 AMImportant Message  Patient Details  Name: [name] [name] MRN: [id number] Date of Birth: 17-Nov-1945   Medicare Important Message Given:  Yes    [name] 05/14/2017, 11:39 AM

## 2017-05-14 NOTE — Progress Notes (Signed)
Pt has been unable to tolerate oral meds today due to nausea and vomiting. Stacey Drain

## 2017-05-14 NOTE — Progress Notes (Signed)
[facility] Therapy Treatment Record Phone [phone]   Radiation Therapy was administered to [name] [name] on: 05/14/2017  10:51 AM and was treatment # 20 out of a planned course of 25 treatments.  Radiation Treatment  1). Beam photons with 6-10 energy  2). Brachytherapy None  3). Stereotactic Radiosurgery None  4). Other Radiation None     Shell Yandow A Antionne Enrique, RT (T)

## 2017-05-14 NOTE — Progress Notes (Signed)
Pt unable to tolerate oral CT contrast due to nausea/ vomiting. Dr. Broadus John notified and order to be changed to IV contrast. [name] [name], [name] [name]

## 2017-05-14 NOTE — NC FL2 (Signed)
[place] LEVEL OF CARE SCREENING TOOL     IDENTIFICATION  Patient Name: [name] [name] Birthdate: 06-26-1946 Sex: male Admission Date (Current Location): 05/06/2017  [facility] and [place] Number:  Herbalist and Address:  [facility],  [place] [address], [place]      Provider Number: [id number]  Attending Physician Name and Address:  Domenic Polite, MD  Relative Name and Phone Number:       Current Level of Care: Hospital Recommended Level of Care: [place] Prior Approval Number:    Date Approved/Denied:   PASRR Number: [id number]  Discharge Plan: SNF    Current Diagnoses: Patient Active Problem List   Diagnosis Date Noted  . Hypoglycemia 05/06/2017  . Acute lower UTI 05/06/2017  . Genetic testing 03/30/2017  . Family history of ovarian cancer   . Acute on chronic systolic CHF (congestive heart failure) ([place]) 01/03/2017  . Carcinoma of body of pancreas ([place]) 12/23/2016  . Chronic anticoagulation (warfarin) 12/09/2016  . 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 Abdominal pain   . Cholecystitis s/p perc cholecystostomy tube 10/18/2016   . Stage 3 chronic kidney disease   . Hypernatremia   . Systolic CHF, acute on chronic ([place]) 10/13/2016  . Diarrhea 10/13/2016  . Acute on chronic systolic congestive heart failure ([place])   . Weakness generalized 04/30/2016  . Status post hip hemiarthroplasty   . Urinary retention   . Dementia   . AKI (acute kidney injury) ([place])   . Thrombocytopenia ([place])   . Anemia of chronic disease   . Closed left hip fracture ([place]) 12/28/2015  . Acute renal failure superimposed on stage 3 chronic kidney disease ([place]) 12/28/2015  . Fall   . Diabetes mellitus with neuropathy ([place]) 01/10/2015  . Atrial fibrillation [I48.91] 11/01/2014  . S/P bilateral BKA (below knee amputation) ([place]) 02/02/2014  . Osteomyelitis of right foot ([place]) 01/30/2014  . Chronic  osteomyelitis of toe of right foot ([place]) 01/04/2014  . Osteomyelitis of toe of right foot ([place]) 01/04/2014  . S/P Lt BKA 11/09/13 11/14/2013  . Acute blood loss anemia 11/11/2013  . Type 2 diabetes mellitus with complication, with Joselito Fieldhouse-term current use of insulin ([place]) 11/09/2013  . Lower limb amputation, great toe ([place]) 10/11/2013  . Lower limb amputation, other toe(s) 10/11/2013  . Chronic osteomyelitis of toe of left foot ([place]) 10/04/2013  . Foot osteomyelitis, left ([place]) 10/04/2013  . Uncontrolled pain, Lt toe 09/21/2013  . PAD (peripheral artery disease) ([place]) 09/13/2013  . Hyperlipidemia 09/13/2013  . PAF (paroxysmal atrial fibrillation) ([place]) 09/13/2013  . Critical lower limb ischemia- s/p Rt anterior tibial PTA 12/29/13 in preparation for Rt BKA 08/30/2013  . Generalized weakness 02/12/2013  . 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 HTN (hypertension) 04/08/2011  . NICM- EF 20-25% echo 6/14 09/29/2008  . LBBB 09/29/2008  . Chronic combined systolic (congestive) and diastolic (congestive) heart failure ([place]) 09/29/2008    Orientation RESPIRATION BLADDER Height & Weight     Self, Time, Situation, Place  Normal Incontinent Weight: 188 lb 15 oz (85.7 kg) Height:  5\' 9"  (175.3 cm)  BEHAVIORAL SYMPTOMS/MOOD NEUROLOGICAL BOWEL NUTRITION STATUS      Incontinent Diet  AMBULATORY STATUS COMMUNICATION OF NEEDS Skin   Total Care Verbally Normal                       Personal Care Assistance Level  of Assistance  Bathing, Feeding, Dressing Bathing Assistance: Maximum assistance Feeding assistance: Limited assistance Dressing Assistance: Maximum assistance     Functional Limitations Info             [place]  PT (By licensed PT), OT (By licensed OT)     PT Frequency: Min 3x OT Frequency: Min 3x            Contractures Contractures Info: Not present    Additional Factors Info  Isolation Precautions, Code Status, Allergies  Code Status Info: Full Code Allergies Info: NKA     Isolation Precautions Info: Contact precautions Infection: ESBL, MRSA     Current Medications (05/14/2017):  This is the current hospital active medication list Current Facility-Administered Medications  Medication Dose Route Frequency Provider Last Rate Last Dose  . acetaminophen (TYLENOL) tablet 650 mg  650 mg Oral Q6H PRN Annita Brod, MD   650 mg at 05/10/17 2143   Or  . acetaminophen (TYLENOL) suppository 650 mg  650 mg Rectal Q6H PRN Annita Brod, MD      . carvedilol (COREG) tablet 6.25 mg  6.25 mg Oral BID WC Regalado, Belkys A, MD   6.25 mg at 05/14/17 0806  . insulin aspart (novoLOG) injection 0-5 Units  0-5 Units Subcutaneous QHS Annita Brod, MD   2 Units at 05/13/17 2131  . insulin aspart (novoLOG) injection 0-9 Units  0-9 Units Subcutaneous TID WC Annita Brod, MD   3 Units at 05/14/17 1217  . insulin glargine (LANTUS) injection 12 Units  12 Units Subcutaneous QHS Domenic Polite, MD   12 Units at 05/13/17 2131  . iopamidol (ISOVUE-300) 61 % injection 15 mL  15 mL Oral BID PRN Domenic Polite, MD      . iopamidol (ISOVUE-300) 61 % injection           . iopamidol (ISOVUE-300) 61 % injection           . lipase/protease/amylase (CREON) capsule 36,000 Units  36,000 Units Oral TID WC Regalado, Belkys A, MD   36,000 Units at 05/14/17 0806  . loperamide (IMODIUM) capsule 2 mg  2 mg Oral PRN Regalado, Belkys A, MD   2 mg at 05/12/17 0347  . morphine 4 MG/ML injection 2-4 mg  2-4 mg Intravenous Q4H PRN Domenic Polite, MD   2 mg at 05/14/17 8527  . multivitamin with minerals tablet 1 tablet  1 tablet Oral Daily Annita Brod, MD   1 tablet at 05/12/17 0913  . ondansetron (ZOFRAN) tablet 4 mg  4 mg Oral Q6H PRN Annita Brod, MD       Or  . ondansetron [facility]) injection 4 mg  4 mg Intravenous Q6H PRN Annita Brod, MD   4 mg at 05/14/17 7824  . oxyCODONE (Oxy IR/ROXICODONE) immediate release  tablet 5 mg  5 mg Oral Q6H PRN Domenic Polite, MD      . pantoprazole (PROTONIX) injection 40 mg  40 mg Intravenous Q12H Regalado, Belkys A, MD   40 mg at 05/14/17 1212  . polyethylene glycol (MIRALAX / GLYCOLAX) packet 17 g  17 g Oral Daily PRN Annita Brod, MD      . pregabalin (LYRICA) capsule 75 mg  75 mg Oral BID Annita Brod, MD   75 mg at 05/13/17 2103  . tamsulosin (FLOMAX) capsule 0.4 mg  0.4 mg Oral QPC supper Annita Brod, MD   0.4 mg at 05/12/17 2310  .  traMADol (ULTRAM) tablet 50 mg  50 mg Oral Q6H PRN Ladell Pier, MD   50 mg at 05/13/17 2103     Discharge Medications: Please see discharge summary for a list of discharge medications.  Relevant Imaging Results:  Relevant Lab Results:   Additional Information SSN: [id number]  Burnis Medin, LCSW

## 2017-05-15 ENCOUNTER — Ambulatory Visit: Payer: Medicare Other

## 2017-05-15 DIAGNOSIS — I428 Other cardiomyopathies: Secondary | ICD-10-CM

## 2017-05-15 LAB — BASIC METABOLIC PANEL
Anion gap: 11 (ref 5–15)
BUN: 27 mg/dL — ABNORMAL HIGH (ref 6–20)
CALCIUM: 8.5 mg/dL — AB (ref 8.9–10.3)
CHLORIDE: 114 mmol/L — AB (ref 101–111)
CO2: 25 mmol/L (ref 22–32)
Creatinine, Ser: 1.49 mg/dL — ABNORMAL HIGH (ref 0.61–1.24)
GFR calc non Af Amer: 45 mL/min — ABNORMAL LOW (ref >60)
GFR, EST AFRICAN AMERICAN: 52 mL/min — AB (ref >60)
GLUCOSE: 229 mg/dL — AB (ref 65–99)
Potassium: 3.7 mmol/L (ref 3.5–5.1)
Sodium: 150 mmol/L — ABNORMAL HIGH (ref 135–145)

## 2017-05-15 LAB — CBC
HEMATOCRIT: 33.7 % — AB (ref 39.0–52.0)
HEMOGLOBIN: 11.1 g/dL — AB (ref 13.0–17.0)
MCH: 25.9 pg — ABNORMAL LOW (ref 26.0–34.0)
MCHC: 32.9 g/dL (ref 30.0–36.0)
MCV: 78.6 fL (ref 78.0–100.0)
Platelets: 109 10*3/uL — ABNORMAL LOW (ref 150–400)
RBC: 4.29 MIL/uL (ref 4.22–5.81)
RDW: 27.7 % — ABNORMAL HIGH (ref 11.5–15.5)
WBC: 1.7 10*3/uL — ABNORMAL LOW (ref 4.0–10.5)

## 2017-05-15 LAB — GLUCOSE, CAPILLARY: Glucose-Capillary: 190 mg/dL — ABNORMAL HIGH (ref 65–99)

## 2017-05-15 MED ORDER — MORPHINE SULFATE (PF) 4 MG/ML IV SOLN
2.0000 mg | INTRAVENOUS | Status: DC | PRN
  Administered 2017-05-15: 2 mg via INTRAVENOUS
  Filled 2017-05-15: qty 1

## 2017-05-15 MED ORDER — MORPHINE SULFATE (CONCENTRATE) 10 MG /0.5 ML PO SOLN: 5.0000 mg | ORAL | 0 refills | Status: AC | PRN

## 2017-05-15 MED ORDER — POLYETHYLENE GLYCOL 3350 17 G PO PACK: 17.0000 g | PACK | Freq: Every day | ORAL | 0 refills | Status: AC | PRN

## 2017-05-15 NOTE — Progress Notes (Signed)
PT Cancellation Note  Patient Details Name: [name] [name] MRN: [id number] DOB: [birth year]   Cancelled Treatment:    Reason Eval/Treat Not Completed: Fatigue/lethargy limiting ability to participate;Medical issues which prohibited therapy (wife requested no PT this morning, pt lethargic, poor prognosis. PT will sign off, please re-order if appropriate. )   Philomena Doheny 05/15/2017, 10:59 AM [phone]

## 2017-05-15 NOTE — Progress Notes (Addendum)
[facility] Liaison:  RN visit  Received request from [place], [place], for family interest in [place] with request for transfer today.  Chart reviewed.  Met with patient and wife, [name] [name], at bedside to confirm interest and explain services.  Family agreeable to transfer today.  Roselyn Reef, [address], aware.  Registration paperwork completed.  Dr. Orpah Melter to assume care per family request.  Please fax discharge summary to [phone].  RN, please call report to [phone].  Please arrange transport for patient to arrive before noon, if possible.    Thank you.  Edyth Gunnels, RN, BSN [facility] Liaison [phone]  All hospital liaisons are now on [place].

## 2017-05-15 NOTE — Care Management Note (Signed)
Case Management Note  Patient Details  Name: [name] [name] MRN: [id number] Date of Birth: Aug 24, 1946  Subjective/Objective: Noted-DNR. CM cons-for [facility]-CSW aware & following for hospice medical facility.                  Action/Plan:d/c hospice medical facility.   Expected Discharge Date:   (UNKNOWN)               Expected Discharge Plan:  [place]  In-House Referral:  Clinical Social Work  Discharge planning Services  CM Consult  Post Acute Care Choice:    Choice offered to:     DME Arranged:    DME Agency:     HH Arranged:    [place] Agency:     Status of Service:  In process, will continue to follow  If discussed at Long Length of Stay Meetings, dates discussed:    Additional Comments:  Dessa Phi, RN 05/15/2017, 2:14 PM

## 2017-05-15 NOTE — Progress Notes (Signed)
Spoke to Hospice representative regarding patients Implantable cardiac defibrillator. Representative request ICD be turned off for transition to facility. Provider paged with update. [facility] contacted [phone]. Awaiting follow up call from technician.

## 2017-05-15 NOTE — Progress Notes (Signed)
NA+ results 150, Oncology provider notified verbally

## 2017-05-15 NOTE — Progress Notes (Signed)
CSW following to assist with dc planning. [facility] placement has been recommended. Spouse has requested [facility]. CSW has left a VM for [facility] liaison to provide refferral and check bed availability. CSW will update spouse / MD / Nsg once more info is available.  Werner Lean LCSW [phone]

## 2017-05-15 NOTE — Progress Notes (Signed)
PROGRESS NOTE    [name] [name]  QVZ:[id number] DOB: Jan 22, 1946 DOA: 05/06/2017 PCP: Josetta Huddle, MD    Brief Narrative: [name] [name] is a 71 y.o. male with medical history significant for metastatic pancreatic cancer, chronic systolic/diastolic congestive heart failure, stage III chronic kidney disease and diabetes mellitus who was brought to the emergency room with complaints of hypoglycemia. In the emergency room, patient was found to have a large UTI. He was also found to have episodes of hypoglycemia.  Continues to decline, nausea/vomiting, poor Po intake  Assessment & Plan:   Metastatic Pancreatic CA -Last CT from 4/18 and PET 7/18 concerning for liver mets and hilar and infrahilar lymph notes concerning for metastasis to chest -prognosis appears quite poor, Dr.Sherril d/w family abt Hospice,  CT abd pelvis per family request for prognostication and rule out other treatable pathologies this shows disease progression and SBO -continues to have ongoing abd pain, nausea and very poor PO intake -with ongoing decline, CT with disease progression, Dr.Sherril discussed Hospice with family, I  met with wife again this afternoon and recommended [facility]-she is agreeable, CSW consulted  Acute on chronic combine systolic HF; Chest x ray with edema.  -s/p IV lasix, Held entresto  -Echocardiogram done 10/14/16 noted EF of 20-25 percent plus grade 2 diastolic dysfunction and mild to moderate mitral regurg -due to vomiting and poor Po intake held lasix  Encephalopathy -multifactorial, due to meds, hypernatremia, ? New intra-abd infection/translocation from [facility] -regardless prognosis very poor -plan for [facility]  UTI: -treated with IV Rocephin.  Blood culture. One of two positive dysthyroid. Likely contaminant  Urine culture grew Klebsiella. Day 7/7 of ceftriaxone, stop today  Diarrhea; improved. Related to chemo GI pathogen negative  C diff negative    Discontinued flagyl.   Hypokalemia; repleted  Hypomagnesemia; replaced.   Pancytopenia;  -due to chemo  PAF;  -held xarelto due to anemia.   Anemia; due to underlying CA and Chemo -received 2 units of PRBC>  -Dr.Regalado discussed with Dr Benay Spice , he recommended GI evaluation, seen by GI recommended PPI and supportive care only -Hb stable.   Dementia: Mild, stable, no evidence of acute behavioral disturbance  Stage 3 chronic kidney disease: Creatinine at 1.24. At baseline  DM -Hypoglycemia; Patient uses tujeo at home. Hold for now.  -SSI now, hypoglycemia resolved -added lantus  increased dose  DVT prophylaxis: hold xarelto due to low hemoglobin.  Code Status: full code.  Family Communication: none at bedside  Disposition Plan: [facility]  Consultants:   GI  Dr Benay Spice    Procedures: none  Antimicrobials: ceftriaxone/Flagyl   Subjective: -remains obtunded, no Po intake  Objective: Vitals:   05/14/17 2321 05/15/17 0242 05/15/17 0520 05/15/17 1300  BP: 111/86 122/66 135/78 134/71  Pulse: (!) 120 (!) 116 (!) 122 (!) 124  Resp: '20  20 18  '$ Temp: 99.5 F (37.5 C) 98.2 F (36.8 C) 98.3 F (36.8 C) 99.8 F (37.7 C)  TempSrc: Oral Oral Oral Axillary  SpO2: 99% 96% 97%   Weight:      Height:        Intake/Output Summary (Last 24 hours) at 05/15/17 1434 Last data filed at 05/15/17 0600  Gross per 24 hour  Intake                0 ml  Output              800 ml  Net             -  800 ml   Filed Weights   05/06/17 1047 05/06/17 1603  Weight: 80.7 kg (178 lb) 85.7 kg (188 lb 15 oz)    Examination:  ZES:PQZRA, chronically ill male, poorly responsive, uncomfortable HEENT: PERRLA, Neck supple, no JVD Lungs: Good air movement bilaterally, CTAB CVS: RRR,No Gallops,Rubs or new Murmurs Abd: soft, distended, BS present but diminished, mild diffuse tenderness Extremities: B/L BKA Skin: forehead lesions/scabs    Data Reviewed: I have  personally reviewed following labs and imaging studies  CBC:  Recent Labs Lab 05/10/17 0544 05/11/17 0544 05/12/17 0939 05/13/17 0505 05/15/17 0437  WBC 2.1* 2.0* 3.4* 2.6* 1.7*  HGB 10.2* 10.3* 10.7* 10.8* 11.1*  HCT 30.6* 30.0* 32.8* 32.4* 33.7*  MCV 77.9* 78.9 78.8 79.8 78.6  PLT 144* 127* 114* 99* 076*   Basic Metabolic Panel:  Recent Labs Lab 05/09/17 0536 05/10/17 0544 05/11/17 0544 05/12/17 0939 05/13/17 0505 05/15/17 0437  NA 146* 143 143 148* 147* 150*  K 3.3* 4.1 3.6 3.9 3.9 3.7  CL 114* 113* 112* 113* 114* 114*  CO2 '24 23 22 '$ 21* 21* 25  GLUCOSE 178* 253* 241* 260* 236* 229*  BUN '12 12 12 16 17 '$ 27*  CREATININE 1.13 1.09 0.99 1.23 1.22 1.49*  CALCIUM 7.8* 7.7* 7.9* 7.9* 7.9* 8.5*  MG 1.6*  --  2.1  --   --   --    GFR: Estimated Creatinine Clearance: 47.9 mL/min (A) (by C-G formula based on SCr of 1.49 mg/dL (H)). Liver Function Tests: No results for input(s): AST, ALT, ALKPHOS, BILITOT, PROT, ALBUMIN in the last 168 hours. No results for input(s): LIPASE, AMYLASE in the last 168 hours. No results for input(s): AMMONIA in the last 168 hours. Coagulation Profile: No results for input(s): INR, PROTIME in the last 168 hours. Cardiac Enzymes: No results for input(s): CKTOTAL, CKMB, CKMBINDEX, TROPONINI in the last 168 hours. BNP (last 3 results) No results for input(s): PROBNP in the last 8760 hours. HbA1C: No results for input(s): HGBA1C in the last 72 hours. CBG:  Recent Labs Lab 05/14/17 0756 05/14/17 1157 05/14/17 1629 05/14/17 2212 05/15/17 0758  GLUCAP 265* 233* 248* 233* 190*   Lipid Profile: No results for input(s): CHOL, HDL, LDLCALC, TRIG, CHOLHDL, LDLDIRECT in the last 72 hours. Thyroid Function Tests: No results for input(s): TSH, T4TOTAL, FREET4, T3FREE, THYROIDAB in the last 72 hours. Anemia Panel: No results for input(s): VITAMINB12, FOLATE, FERRITIN, TIBC, IRON, RETICCTPCT in the last 72 hours. Sepsis Labs: No results for  input(s): PROCALCITON, LATICACIDVEN in the last 168 hours.  Recent Results (from the past 240 hour(s))  Urine culture     Status: Abnormal   Collection Time: 05/06/17 11:27 AM  Result Value Ref Range Status   Specimen Description URINE, CLEAN CATCH  Final   Special Requests Immunocompromised  Final   Culture >=100,000 COLONIES/mL KLEBSIELLA PNEUMONIAE (A)  Final   Report Status 05/08/2017 FINAL  Final   Organism ID, Bacteria KLEBSIELLA PNEUMONIAE (A)  Final      Susceptibility   Klebsiella pneumoniae - MIC*    AMPICILLIN RESISTANT Resistant     CEFAZOLIN <=4 SENSITIVE Sensitive     CEFTRIAXONE <=1 SENSITIVE Sensitive     CIPROFLOXACIN <=0.25 SENSITIVE Sensitive     GENTAMICIN <=1 SENSITIVE Sensitive     IMIPENEM <=0.25 SENSITIVE Sensitive     NITROFURANTOIN 64 INTERMEDIATE Intermediate     TRIMETH/SULFA <=20 SENSITIVE Sensitive     AMPICILLIN/SULBACTAM <=2 SENSITIVE Sensitive     PIP/TAZO <=4 SENSITIVE  Sensitive     Extended ESBL NEGATIVE Sensitive     * >=100,000 COLONIES/mL KLEBSIELLA PNEUMONIAE  Culture, blood (routine x 2)     Status: Abnormal   Collection Time: 05/06/17 12:00 PM  Result Value Ref Range Status   Specimen Description BLOOD LEFT ANTECUBITAL  Final   Special Requests   Final    BOTTLES DRAWN AEROBIC AND ANAEROBIC Blood Culture adequate volume   Culture  Setup Time   Final    GRAM POSITIVE RODS AEROBIC BOTTLE ONLY CRITICAL RESULT CALLED TO, READ BACK BY AND VERIFIED WITH: J.GRIMSLEY PHARMD 05/08/17 0603 L.CHAMPION    Culture (A)  Final    DIPHTHEROIDS(CORYNEBACTERIUM SPECIES) Standardized susceptibility testing for this organism is not available. Performed at [facility], [place] [address] [place], [place] [address]    Report Status 05/10/2017 FINAL  Final  Blood Culture ID Panel (Reflexed)     Status: None   Collection Time: 05/06/17 12:00 PM  Result Value Ref Range Status   Enterococcus species NOT DETECTED NOT DETECTED Final   Vancomycin  resistance NOT DETECTED NOT DETECTED Final   Listeria monocytogenes NOT DETECTED NOT DETECTED Final   Staphylococcus species NOT DETECTED NOT DETECTED Final   Staphylococcus aureus NOT DETECTED NOT DETECTED Final   Methicillin resistance NOT DETECTED NOT DETECTED Final   Streptococcus species NOT DETECTED NOT DETECTED Final   Streptococcus agalactiae NOT DETECTED NOT DETECTED Final   Streptococcus pneumoniae NOT DETECTED NOT DETECTED Final   Streptococcus pyogenes NOT DETECTED NOT DETECTED Final   Acinetobacter baumannii NOT DETECTED NOT DETECTED Final   Enterobacteriaceae species NOT DETECTED NOT DETECTED Final   Enterobacter cloacae complex NOT DETECTED NOT DETECTED Final   Escherichia coli NOT DETECTED NOT DETECTED Final   Klebsiella oxytoca NOT DETECTED NOT DETECTED Final   Klebsiella pneumoniae NOT DETECTED NOT DETECTED Final   Proteus species NOT DETECTED NOT DETECTED Final   Serratia marcescens NOT DETECTED NOT DETECTED Final   Carbapenem resistance NOT DETECTED NOT DETECTED Final   Haemophilus influenzae NOT DETECTED NOT DETECTED Final   Neisseria meningitidis NOT DETECTED NOT DETECTED Final   Pseudomonas aeruginosa NOT DETECTED NOT DETECTED Final   Candida albicans NOT DETECTED NOT DETECTED Final   Candida glabrata NOT DETECTED NOT DETECTED Final   Candida krusei NOT DETECTED NOT DETECTED Final   Candida parapsilosis NOT DETECTED NOT DETECTED Final   Candida tropicalis NOT DETECTED NOT DETECTED Final    Comment: Performed at [facility], [place] [address] [place], [place] [address]  Culture, blood (routine x 2)     Status: None   Collection Time: 05/06/17  1:00 PM  Result Value Ref Range Status   Specimen Description RIGHT ANTECUBITAL  Final   Special Requests   Final    BOTTLES DRAWN AEROBIC AND ANAEROBIC Blood Culture adequate volume   Culture   Final    NO GROWTH 5 DAYS Performed at [facility], [place] [address] [place], [place] [address]    Report Status  05/11/2017 FINAL  Final  MRSA PCR Screening     Status: Abnormal   Collection Time: 05/06/17  4:17 PM  Result Value Ref Range Status   MRSA by PCR POSITIVE (A) NEGATIVE Final    Comment:        The GeneXpert MRSA Assay (FDA approved for NASAL specimens only), is one component of a comprehensive MRSA colonization surveillance program. It is not intended to diagnose MRSA infection nor to guide or monitor  treatment for MRSA infections. RESULT CALLED TO, READ BACK BY AND VERIFIED WITH: CORCARAN,R RN 9.12.18 '@2133'$  ZANDO,C   C difficile quick scan w PCR reflex     Status: None   Collection Time: 05/08/17  7:01 AM  Result Value Ref Range Status   C Diff antigen NEGATIVE NEGATIVE Final   C Diff toxin NEGATIVE NEGATIVE Final   C Diff interpretation No C. difficile detected.  Final  Gastrointestinal Panel by PCR , Stool     Status: None   Collection Time: 05/08/17  7:01 AM  Result Value Ref Range Status   Campylobacter species NOT DETECTED NOT DETECTED Final   Plesimonas shigelloides NOT DETECTED NOT DETECTED Final   Salmonella species NOT DETECTED NOT DETECTED Final   Yersinia enterocolitica NOT DETECTED NOT DETECTED Final   Vibrio species NOT DETECTED NOT DETECTED Final   Vibrio cholerae NOT DETECTED NOT DETECTED Final   Enteroaggregative E coli (EAEC) NOT DETECTED NOT DETECTED Final   Enteropathogenic E coli (EPEC) NOT DETECTED NOT DETECTED Final   Enterotoxigenic E coli (ETEC) NOT DETECTED NOT DETECTED Final   Shiga like toxin producing E coli (STEC) NOT DETECTED NOT DETECTED Final   Shigella/Enteroinvasive E coli (EIEC) NOT DETECTED NOT DETECTED Final   Cryptosporidium NOT DETECTED NOT DETECTED Final   Cyclospora cayetanensis NOT DETECTED NOT DETECTED Final   Entamoeba histolytica NOT DETECTED NOT DETECTED Final   Giardia lamblia NOT DETECTED NOT DETECTED Final   Adenovirus F40/41 NOT DETECTED NOT DETECTED Final   Astrovirus NOT DETECTED NOT DETECTED Final   Norovirus GI/GII  NOT DETECTED NOT DETECTED Final   Rotavirus A NOT DETECTED NOT DETECTED Final   Sapovirus (I, II, IV, and V) NOT DETECTED NOT DETECTED Final         Radiology Studies: Ct Abdomen Pelvis W Contrast  Result Date: 05/14/2017 CLINICAL DATA:  Follow-up pancreatic cancer. Patient complains of nausea, vomiting and abdominal pain today. Unable to tolerate p.o. contrast. EXAM: CT ABDOMEN AND PELVIS WITH CONTRAST TECHNIQUE: Multidetector CT imaging of the abdomen and pelvis was performed using the standard protocol following bolus administration of intravenous contrast. CONTRAST:  117m ISOVUE-300 IOPAMIDOL (ISOVUE-300) INJECTION 61% COMPARISON:  PET-CT 03/19/2017 and CT 12/08/2016 FINDINGS: Lower chest: Small bilateral pleural effusions right greater than left with associated compressive atelectasis worse. Mild cardiomegaly with patient leads present. New subcentimeter right pericardial phrenic lymph node. Hepatobiliary: Unremarkable. Pancreas: Continued evidence of patient's known pancreatic adenocarcinoma with slight increase in size of the primary mass over the midline body measuring 5.4 x 7.6 cm. There is been interval progression with extension of the mass throughout the pancreatic body and tail. Spleen: Within normal. Adrenals/Urinary Tract: Adrenal glands are normal. Kidneys are within normal without hydronephrosis or nephrolithiasis. Visualize ureters and bladder are normal. Stomach/Bowel: Stomach is within normal. There are multiple air and fluid-filled dilated small bowel loops. Transition point may be over the anterior right mid abdomen. Appendix is normal. Minimal wall thickening of the colon in the region of the hepatic flexure. Vascular/Lymphatic: Calcified plaque over the abdominal aorta and iliac arteries. No significant adenopathy. Reproductive: Within normal. Other: Worsening mild ascites. Hazy nodularity of the anterior mesentery of the mid to upper abdomen which may be due to the ascites  although cannot exclude peritoneal spread of patient's pancreatic neoplasm. Musculoskeletal: Left hip arthroplasty causing mild streak artifact over the pelvis. Minimal degenerate change of the right hip. Degenerative change of the spine. Mild compression fracture of T9 involving the superior endplate new compared to  12/08/2016 with minimal air within the adjacent disc space. Sclerotic focus over the right iliac bone unchanged likely a bone island. IMPRESSION: Continued interval worsening of patient's known pancreatic adenocarcinoma with slight increase in size of the index midline pancreatic body mass measuring 5.4 x 7.6 cm with worsening extension throughout the body and tail of the pancreas. Worsening mild ascites with subtle nodular stranding of the mesenteric fat over the anterior mid to upper abdomen as cannot exclude peritoneal spread of disease. No significant adenopathy. New multiple dilated air and fluid-filled small bowel loops suggesting mid to distal obstructive process. Transition point may be over the anterior right mid abdomen. New small bowel pleural effusions right greater than left with associated bibasilar atelectasis. Worsening mild T9 compression fracture involving the superior endplate likely nonpathologic in origin. Aortic Atherosclerosis (ICD10-I70.0). Electronically Signed   By: Marin Olp M.D.   On: 05/14/2017 16:00        Scheduled Meds: . carvedilol  6.25 mg Oral BID WC  . lipase/protease/amylase  36,000 Units Oral TID WC  . pantoprazole (PROTONIX) IV  40 mg Intravenous Q12H  . pregabalin  75 mg Oral BID  . tamsulosin  0.4 mg Oral QPC supper   Continuous Infusions:    LOS: 9 days    Time spent: 35 minutes,     Ashonti Leandro, MD [facility] Pager [phone]  If 7PM-7AM, please contact night-coverage [email or web address] Password [facility] 05/15/2017, 2:34 PM

## 2017-05-15 NOTE — Progress Notes (Signed)
The patient was unable to come for treatment today. Unfortunately, his status has declined. CT imaging also unfortunately has demonstrated some progression of his disease. Chemotherapy has been discontinued and I discussed with the patient's family discontinuing radiation treatment as well. When I visited the patient, he was resting comfortably. After this discussion, it was decided to forego further radiation treatment and he has plans to be transferred to [facility] can place. I am fully supportive of this decision and encouraged the patient's family to contact me if I can be of any further assistance.  ------------------------------------------------  [name], MD, PhD

## 2017-05-15 NOTE — Progress Notes (Signed)
IP PROGRESS NOTE  Subjective:   Mr. Crymes is lethargic this morning. He received Phenergan yesterday afternoon and morphine early this morning. His family is not present this morning  Objective: Vital signs in last 24 hours: Blood pressure 135/78, pulse (!) 122, temperature 98.3 F (36.8 C), temperature source Oral, resp. rate 20, height 5\' 9"  (1.753 m), weight 188 lb 15 oz (85.7 kg), SpO2 97 %.  Intake/Output from previous day: 09/20 0701 - 09/21 0700 In: 64 [IV Piggyback:50] Out: 801 [Urine:801]  Physical Exam: Cardiac: Regular rate and rhythm, tachycardia Lungs: Clear anteriorly Abdomen: Mildly distended, no mass, nontender Neurologic: Lethargic, arousable, follows simple commands, normal handgrip bilaterally, opens eyes, speaks a few words  Lab Results:  Recent Labs  05/13/17 0505 05/15/17 0437  WBC 2.6* 1.7*  HGB 10.8* 11.1*  HCT 32.4* 33.7*  PLT 99* 109*    BMET  Recent Labs  05/13/17 0505 05/15/17 0437  NA 147* 150*  K 3.9 3.7  CL 114* 114*  CO2 21* 25  GLUCOSE 236* 229*  BUN 17 27*  CREATININE 1.22 1.49*  CALCIUM 7.9* 8.5*   CT abdomen/pelvis 05/14/2017: Slight increase in the size of the pancreas mass with progression of the mass throughout the pancreas body and tail, multiple dilated small bowel loops with a transition point at the right mid abdomen, increased ascites, haziness nodularity in the anterior mesentery, small bilateral pleural effusions  Images reviewed  Medications: I have reviewed the patient's current medications.  Assessment/Plan: 1. Pancreas cancer, pancreas body mass on CT 12/08/2016  CT 12/08/2016 consistent with a pancreas body mass and liver lesions suspicious for metastases  EUS on 12/15/2016 confirmed a pancreas body mass, T3 N0, biopsy consistent with adenocarcinoma  Abdominal ultrasound 02/26/2017-pancreas mass, no liver lesions apparent  PET scan 03/19/2017-enlargement of pancreas mass with vascular encasement and  new involvement of the tail of the pancreas, mildly hypermetabolic chest and porta hepatis nodes. Possible hypermetabolic activity at [id number] to represent a benign etiology  Initiation of concurrent radiation/Xeloda 04/13/2017  2. Acute cholecystitis February 2018, status post placement of a cholecystostomy tube  3. Diabetes  4. Congestive heart failure;hospitalized May 2018 with CHF  5. Peripheral vascular disease, status post bilateral BKA  6. Atrial fibrillation  7. Microcytic anemia-progressive, iron deficiency?, Bleeding related to Xarelto or tumor involving the GI tract  Red cell transfusion 04/07/2017 and 05/07/2017  8. C. difficile colitis April 2018  9. Chronic renal failure  10. Anorectal hyperenhancing lesion noted on CT 12/08/2016  11. Admission 05/06/2017 with hypoglycemia  12. Klebsiella Urinary tract infection 05/06/2017  13. Nausea/abdominal pain-secondary to the pancreas tumor, carcinomatosis, and a partial small bowel obstruction   Mr. Ingwersen has declined over the past 24 hours. This may be related to polypharmacy or dehydration. I have a low suspicion for a primary CNS event.  He is not a candidate for surgery or further treatment of the pancreas cancer. I recommend Hospice care.  Addendum: I returned at approximately 11 AM to discuss the situation with Mr. Teuscher family including his wife, son, and daughter. I explained the CT findings and the poor prognosis. They understand he may survive days or weeks.  They agree with comfort care and Hospice. We discussed placement at [facility] versus [facility]. He appears to be a good candidate for [facility] place if this is not too far for his family travel.  We discussed CPR and ACLS issues. I recommend a no CODE BLUE status. The family would like to discuss  this further. They will let Dr. Broadus John or myself know if they are in agreement with a no CODE BLUE status. I also recommend turning off  the defibrillator.  I will place an order for a [facility] hospice consult. I discussed the case with Dr. Broadus John.  I will be out until 05/18/2017. Please call oncology as needed.    LOS: 9 days   Donneta Romberg, MD   05/15/2017, 1:15 PM

## 2017-05-15 NOTE — Discharge Summary (Addendum)
Physician Discharge Summary  AQUARIUS LATOUCHE LOV:[id number] DOB: [birth year] DOA: 05/06/2017  PCP: Josetta Huddle, MD  Admit date: 05/06/2017 Discharge date: 05/15/2017  Time spent: 35 minutes  Recommendations for Outpatient Follow-up:  [facility]  Discharge Diagnoses:  Principal Problem:   Hypoglycemia   UTI   Metastatic pancreatic cancer   Metabolic encephalopathy   NICM- EF 20-25% echo 6/14   Chronic combined systolic (congestive) and diastolic (congestive) heart failure (HCC)   HTN (hypertension)   BiV ICD (BS).  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 In the emergency room, patient was found to have a large UTI. He was also found to have episodes of hypoglycemia.  Continues to decline, nausea/vomiting, poor Po intake  Hospital Course:  Metastatic Pancreatic CA -Last CT from 4/18 and PET 7/18 concerning for liver mets and hilar and infrahilar lymph notes concerning for metastasis to chest -prognosis appears quite poor, Dr.Sherril d/w family abt Hospice,  CT abd pelvis per family request for prognostication and rule out other treatable pathologies this shows disease progression and SBO -continues to have ongoing abd pain,  nausea and very poor PO intake -with ongoing decline, CT with disease progression, Dr.Sherril discussed Hospice with family, I  met with wife again this afternoon and recommended Allena Katz is agreeable, CSW consulted, remains poorly responsive with minimal Po intake.  Acute on chronic combine systolic HF; Chest x ray with edema.  -s/p IV lasix, Held entresto  -Echocardiogram done 10/14/16 noted EF of 20-25 percent plus grade 2 diastolic dysfunction and mild to moderate mitral regurg -due to vomiting and poor Po intake held lasix -ICD turned off prior to discharge   Encephalopathy -multifactorial, due to meds, hypernatremia, ? New intra-abd infection/translocation from [facility] -regardless prognosis very poor -plan for Comfort Care, [facility]idential H[facility]  UTI: -treated with IV Rocephin.  Blood culture. One of two positive dysthyroid. Likely contaminant  -Urine culture grew Klebsiella. Completed 7days of IV ceftriaxone  Diarrhea; improved. Related to chemo GI pathogen negative  C diff negative  Discontinued flagyl.   Hypokalemia; repleted  Hypomagnesemia; replaced.   Pancytopenia;  -due to chemo  PAF;  -held xarelto due to anemia.   Anemia; due to underlying CA and Chemo -received 2 units of PRBC>  -Dr.Regalado discussed with Dr Benay Spice , he recommended GI evaluation, seen by GI recommended PPI and supportive care only -Hb stable.   Dementia: Mild, stable, no evidence of acute behavioral disturbance  Stage 3 chronic kidney disease: Creatinine at 1.24. At baseline  DM -Hypoglycemia; Patient uses tujeo at home. Hold for now.  -now comfort care  Discharge Exam: Vitals:   05/15/17 0520 05/15/17 1300  BP: 135/78 134/71  Pulse: (!) 122 (!) 124  Resp: 20 18  Temp:  98.3 F (36.8 C) 99.8 F (37.7 C)  SpO2: 97%     General: obtunded, poorly responsive Cardiovascular: S!S2/RRR Respiratory: diminished BS at bases  Discharge Instructions   Discharge  Instructions    Discharge instructions    Complete by:  As directed    Comfort feeds   Increase activity slowly    Complete by:  As directed      Current Discharge Medication List    START taking these medications   Details  Morphine Sulfate (MORPHINE CONCENTRATE) 10 mg / 0.5 ml concentrated solution Take 0.25 mLs (5 mg total) by mouth every 2 (two) hours as needed for severe pain or shortness of breath. Qty: 30 mL, Refills: 0    polyethylene glycol (MIRALAX / GLYCOLAX) packet Take 17 g by mouth daily as needed for mild constipation. Qty: 14 each, Refills: 0      CONTINUE these medications which have NOT CHANGED   Details  acetaminophen (TYLENOL) 325 MG tablet Take 2 tablets (650 mg total) by mouth every 6 (six) hours as needed for mild pain (or Fever >/= 101).    pregabalin (LYRICA) 75 MG capsule Take 1 capsule (75 mg total) by mouth 2 (two) times daily. Qty: 60 capsule, Refills: 1    tamsulosin (FLOMAX) 0.4 MG CAPS capsule Take 1 capsule (0.4 mg total) by mouth daily after supper. Qty: 30 capsule, Refills: 0      STOP taking these medications     alum & mag hydroxide-simeth (MAALOX/MYLANTA) 200-200-20 MG/5ML suspension      atorvastatin (LIPITOR) 10 MG tablet      capecitabine (XELODA) 500 MG tablet      carvedilol (COREG) 6.25 MG tablet      cholecalciferol (VITAMIN D) 1000 units tablet      furosemide (LASIX) 40 MG tablet      Insulin Aspart (NOVOLOG [place])      insulin detemir (LEVEMIR) 100 UNIT/ML injection      iron polysaccharides (NIFEREX) 150 MG capsule      Multiple Vitamin (MULTIVITAMIN WITH MINERALS) TABS tablet      omeprazole (PRILOSEC) 20 MG capsule      potassium chloride SA (K-DUR,KLOR-CON) 20 MEQ tablet      rivaroxaban (XARELTO) 20 MG TABS tablet      saccharomyces boulardii (FLORASTOR) 250 MG capsule      sacubitril-valsartan (ENTRESTO) 24-26 MG      ondansetron (ZOFRAN) 8 MG tablet        No Known Allergies    The results of  significant diagnostics from this hospitalization (including imaging, microbiology, ancillary and laboratory) are listed below for reference.    Significant Diagnostic Studies: Dg Chest 2 View  Result Date: 05/06/2017 CLINICAL DATA:  History of pancreatic cancer and hypoglycemia EXAM: CHEST  2 VIEW COMPARISON:  01/07/2017 FINDINGS: Cardiac shadow is mildly enlarged. A defibrillator is again seen and stable. The lungs are well aerated bilaterally. No focal infiltrate is noted. Small bilateral pleural effusions are seen. No significant pulmonary edema is noted. No bony abnormality is seen. IMPRESSION: Small bilateral pleural effusions. No other focal abnormality is seen. Electronically Signed   By: Inez Catalina M.D.   On: 05/06/2017 11:57   Ct Head Wo Contrast  Result Date: 05/06/2017 CLINICAL DATA:  [place] last night from a chair striking forehead on edge of sink, frontal abrasion, history dementia, CHF, pancreatic cancer, coronary artery disease, type II diabetes mellitus EXAM: CT HEAD WITHOUT CONTRAST TECHNIQUE: Contiguous axial images were obtained from the base  of the skull through the vertex without intravenous contrast. Sagittal and coronal MPR images reconstructed from axial data set. COMPARISON:  04/30/2016 FINDINGS: Brain: Generalized atrophy. Normal ventricular morphology. No midline shift or mass effect. Small vessel chronic ischemic changes of deep cerebral white matter. 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 No intracranial hemorrhage, mass lesion or evidence of acute infarction. No extra-axial fluid collections. Vascular: Atherosclerotic calcification of internal carotid and vertebral arteries at skullbase Skull: Intact. Mild supraorbital scalp contusion/soft tissue swelling. Sinuses/Orbits: Intraorbital soft tissue planes clear. Bony orbits intact. Other: N/A IMPRESSION: Atrophy with small vessel chronic ischemic changes of deep cerebral white matter. Tiny old lacunar infarcts LEFT  external capsule. No acute intracranial abnormalities. Electronically Signed   By: Lavonia Dana M.D.   On: 05/06/2017 11:53   Ct Abdomen Pelvis W Contrast  Result Date: 05/14/2017 CLINICAL DATA:  Follow-up pancreatic cancer. Patient complains of nausea, vomiting and abdominal pain today. Unable to tolerate p.o. contrast. EXAM: CT ABDOMEN AND PELVIS WITH CONTRAST TECHNIQUE: Multidetector CT imaging of the abdomen and pelvis was performed using the standard protocol following bolus administration of intravenous contrast. CONTRAST:  117m ISOVUE-300 IOPAMIDOL (ISOVUE-300) INJECTION 61% COMPARISON:  PET-CT 03/19/2017 and CT 12/08/2016 FINDINGS: Lower chest: Small bilateral pleural effusions right greater than left with associated compressive atelectasis worse. Mild cardiomegaly with patient leads present. New subcentimeter right pericardial phrenic lymph node. Hepatobiliary: Unremarkable. Pancreas: Continued evidence of patient's known pancreatic adenocarcinoma with slight increase in size of the primary mass over the midline body measuring 5.4 x 7.6 cm. There is been interval progression with extension of the mass throughout the pancreatic body and tail. Spleen: Within normal. Adrenals/Urinary Tract: Adrenal glands are normal. Kidneys are within normal without hydronephrosis or nephrolithiasis. Visualize ureters and bladder are normal. Stomach/Bowel: Stomach is within normal. There are multiple air and fluid-filled dilated small bowel loops. Transition point may be over the anterior right mid abdomen. Appendix is normal. Minimal wall thickening of the colon in the region of the hepatic flexure. Vascular/Lymphatic: Calcified plaque over the abdominal aorta and iliac arteries. No significant adenopathy. Reproductive: Within normal. Other: Worsening mild ascites. Hazy nodularity of the anterior mesentery of the mid to upper abdomen which may be due to the ascites although cannot exclude peritoneal spread of patient's  pancreatic neoplasm. Musculoskeletal: Left hip arthroplasty causing mild streak artifact over the pelvis. Minimal degenerate change of the right hip. Degenerative change of the spine. Mild compression fracture of T9 involving the superior endplate new compared to 12/08/2016 with minimal air within the adjacent disc space. Sclerotic focus over the right iliac bone unchanged likely a bone island. IMPRESSION: Continued interval worsening of patient's known pancreatic adenocarcinoma with slight increase in size of the index midline pancreatic body mass measuring 5.4 x 7.6 cm with worsening extension throughout the body and tail of the pancreas. Worsening mild ascites with subtle nodular stranding of the mesenteric fat over the anterior mid to upper abdomen as cannot exclude peritoneal spread of disease. No significant adenopathy. New multiple dilated air and fluid-filled small bowel loops suggesting mid to distal obstructive process. Transition point may be over the anterior right mid abdomen. New small bowel pleural effusions right greater than left with associated bibasilar atelectasis. Worsening mild T9 compression fracture involving the superior endplate likely nonpathologic in origin. Aortic Atherosclerosis (ICD10-I70.0). Electronically Signed   By: DMarin OlpM.D.   On: 05/14/2017 16:00   UKoreaAbdomen Limited  Result Date: 05/09/2017 CLINICAL DATA:  Abdominal distention. EXAM: LIMITED  ABDOMEN ULTRASOUND FOR ASCITES TECHNIQUE: Limited ultrasound survey for ascites was performed in all four abdominal quadrants. COMPARISON:  Abdominal ultrasound dated February 26, 2017. FINDINGS: Small ascites in the left lower quadrant. IMPRESSION: Small ascites in the left lower quadrant. Electronically Signed   By: Titus Dubin M.D.   On: 05/09/2017 08:50   Dg Abd Portable 1v  Result Date: 05/06/2017 CLINICAL DATA:  Episodes of hypoglycemia. EXAM: PORTABLE ABDOMEN - 1 VIEW COMPARISON:  None. FINDINGS: The bowel gas pattern  is normal. No radio-opaque calculi or other significant radiographic abnormality are seen. Left hip replacement is noted. IMPRESSION: No bowel obstruction. Electronically Signed   By: Abelardo Diesel M.D.   On: 05/06/2017 21:08    Microbiology: Recent Results (from the past 240 hour(s))  Urine culture     Status: Abnormal   Collection Time: 05/06/17 11:27 AM  Result Value Ref Range Status   Specimen Description URINE, CLEAN CATCH  Final   Special Requests Immunocompromised  Final   Culture >=100,000 COLONIES/mL KLEBSIELLA PNEUMONIAE (A)  Final   Report Status 05/08/2017 FINAL  Final   Organism ID, Bacteria KLEBSIELLA PNEUMONIAE (A)  Final      Susceptibility   Klebsiella pneumoniae - MIC*    AMPICILLIN RESISTANT Resistant     CEFAZOLIN <=4 SENSITIVE Sensitive     CEFTRIAXONE <=1 SENSITIVE Sensitive     CIPROFLOXACIN <=0.25 SENSITIVE Sensitive     GENTAMICIN <=1 SENSITIVE Sensitive     IMIPENEM <=0.25 SENSITIVE Sensitive     NITROFURANTOIN 64 INTERMEDIATE Intermediate     TRIMETH/SULFA <=20 SENSITIVE Sensitive     AMPICILLIN/SULBACTAM <=2 SENSITIVE Sensitive     PIP/TAZO <=4 SENSITIVE Sensitive     Extended ESBL NEGATIVE Sensitive     * >=100,000 COLONIES/mL KLEBSIELLA PNEUMONIAE  Culture, blood (routine x 2)     Status: Abnormal   Collection Time: 05/06/17 12:00 PM  Result Value Ref Range Status   Specimen Description BLOOD LEFT ANTECUBITAL  Final   Special Requests   Final    BOTTLES DRAWN AEROBIC AND ANAEROBIC Blood Culture adequate volume   Culture  Setup Time   Final    GRAM POSITIVE RODS AEROBIC BOTTLE ONLY CRITICAL RESULT CALLED TO, READ BACK BY AND VERIFIED WITH: J.GRIMSLEY PHARMD 05/08/17 0603 L.CHAMPION    Culture (A)  Final    DIPHTHEROIDS(CORYNEBACTERIUM SPECIES) Standardized susceptibility testing for this organism is not available. Performed at [facility], [place] [address] [place], [place] [address]    Report Status 05/10/2017 FINAL  Final  Blood Culture  ID Panel (Reflexed)     Status: None   Collection Time: 05/06/17 12:00 PM  Result Value Ref Range Status   Enterococcus species NOT DETECTED NOT DETECTED Final   Vancomycin resistance NOT DETECTED NOT DETECTED Final   Listeria monocytogenes NOT DETECTED NOT DETECTED Final   Staphylococcus species NOT DETECTED NOT DETECTED Final   Staphylococcus aureus NOT DETECTED NOT DETECTED Final   Methicillin resistance NOT DETECTED NOT DETECTED Final   Streptococcus species NOT DETECTED NOT DETECTED Final   Streptococcus agalactiae NOT DETECTED NOT DETECTED Final   Streptococcus pneumoniae NOT DETECTED NOT DETECTED Final   Streptococcus pyogenes NOT DETECTED NOT DETECTED Final   Acinetobacter baumannii NOT DETECTED NOT DETECTED Final   Enterobacteriaceae species NOT DETECTED NOT DETECTED Final   Enterobacter cloacae complex NOT DETECTED NOT DETECTED Final   Escherichia coli NOT DETECTED NOT DETECTED Final   Klebsiella oxytoca NOT DETECTED NOT DETECTED Final   Klebsiella pneumoniae  NOT DETECTED NOT DETECTED Final   Proteus species NOT DETECTED NOT DETECTED Final   Serratia marcescens NOT DETECTED NOT DETECTED Final   Carbapenem resistance NOT DETECTED NOT DETECTED Final   Haemophilus influenzae NOT DETECTED NOT DETECTED Final   Neisseria meningitidis NOT DETECTED NOT DETECTED Final   Pseudomonas aeruginosa NOT DETECTED NOT DETECTED Final   Candida albicans NOT DETECTED NOT DETECTED Final   Candida glabrata NOT DETECTED NOT DETECTED Final   Candida krusei NOT DETECTED NOT DETECTED Final   Candida parapsilosis NOT DETECTED NOT DETECTED Final   Candida tropicalis NOT DETECTED NOT DETECTED Final    Comment: Performed at [facility], [place] [address], [place], [place] [address]  Culture, blood (routine x 2)     Status: None   Collection Time: 05/06/17  1:00 PM  Result Value Ref Range Status   Specimen Description RIGHT ANTECUBITAL  Final   Special Requests   Final    BOTTLES DRAWN AEROBIC AND  ANAEROBIC Blood Culture adequate volume   Culture   Final    NO GROWTH 5 DAYS Performed at [facility], [employer] [address] [place], [place] [address]    Report Status 05/11/2017 FINAL  Final  MRSA PCR Screening     Status: Abnormal   Collection Time: 05/06/17  4:17 PM  Result Value Ref Range Status   MRSA by PCR POSITIVE (A) NEGATIVE Final    Comment:        The GeneXpert MRSA Assay (FDA approved for NASAL specimens only), is one component of a comprehensive MRSA colonization surveillance program. It is not intended to diagnose MRSA infection nor to guide or monitor treatment for MRSA infections. RESULT CALLED TO, READ BACK BY AND VERIFIED WITH: CORCARAN,R RN 9.12.18 '@2133'$  ZANDO,C   C difficile quick scan w PCR reflex     Status: None   Collection Time: 05/08/17  7:01 AM  Result Value Ref Range Status   C Diff antigen NEGATIVE NEGATIVE Final   C Diff toxin NEGATIVE NEGATIVE Final   C Diff interpretation No C. difficile detected.  Final  Gastrointestinal Panel by PCR , Stool     Status: None   Collection Time: 05/08/17  7:01 AM  Result Value Ref Range Status   Campylobacter species NOT DETECTED NOT DETECTED Final   Plesimonas shigelloides NOT DETECTED NOT DETECTED Final   Salmonella species NOT DETECTED NOT DETECTED Final   Yersinia enterocolitica NOT DETECTED NOT DETECTED Final   Vibrio species NOT DETECTED NOT DETECTED Final   Vibrio cholerae NOT DETECTED NOT DETECTED Final   Enteroaggregative E coli (EAEC) NOT DETECTED NOT DETECTED Final   Enteropathogenic E coli (EPEC) NOT DETECTED NOT DETECTED Final   Enterotoxigenic E coli (ETEC) NOT DETECTED NOT DETECTED Final   Shiga like toxin producing E coli (STEC) NOT DETECTED NOT DETECTED Final   Shigella/Enteroinvasive E coli (EIEC) NOT DETECTED NOT DETECTED Final   Cryptosporidium NOT DETECTED NOT DETECTED Final   Cyclospora cayetanensis NOT DETECTED NOT DETECTED Final   Entamoeba histolytica NOT DETECTED NOT DETECTED  Final   Giardia lamblia NOT DETECTED NOT DETECTED Final   Adenovirus F40/41 NOT DETECTED NOT DETECTED Final   Astrovirus NOT DETECTED NOT DETECTED Final   Norovirus GI/GII NOT DETECTED NOT DETECTED Final   Rotavirus A NOT DETECTED NOT DETECTED Final   Sapovirus (I, II, IV, and V) NOT DETECTED NOT DETECTED Final     Labs: Basic Metabolic Panel:  Recent Labs Lab 05/09/17 0536 05/10/17 0544  05/11/17 0544 05/12/17 0939 05/13/17 0505 05/15/17 0437  NA 146* 143 143 148* 147* 150*  K 3.3* 4.1 3.6 3.9 3.9 3.7  CL 114* 113* 112* 113* 114* 114*  CO2 '24 23 22 '$ 21* 21* 25  GLUCOSE 178* 253* 241* 260* 236* 229*  BUN '12 12 12 16 17 '$ 27*  CREATININE 1.13 1.09 0.99 1.23 1.22 1.49*  CALCIUM 7.8* 7.7* 7.9* 7.9* 7.9* 8.5*  MG 1.6*  --  2.1  --   --   --    Liver Function Tests: No results for input(s): AST, ALT, ALKPHOS, BILITOT, PROT, ALBUMIN in the last 168 hours. No results for input(s): LIPASE, AMYLASE in the last 168 hours. No results for input(s): AMMONIA in the last 168 hours. CBC:  Recent Labs Lab 05/10/17 0544 05/11/17 0544 05/12/17 0939 05/13/17 0505 05/15/17 0437  WBC 2.1* 2.0* 3.4* 2.6* 1.7*  HGB 10.2* 10.3* 10.7* 10.8* 11.1*  HCT 30.6* 30.0* 32.8* 32.4* 33.7*  MCV 77.9* 78.9 78.8 79.8 78.6  PLT 144* 127* 114* 99* 109*   Cardiac Enzymes: No results for input(s): CKTOTAL, CKMB, CKMBINDEX, TROPONINI in the last 168 hours. BNP: BNP (last 3 results)  Recent Labs  01/02/17 2237 01/08/17 0521 05/06/17 1127  BNP 1,012.6* 1,149.8* 552.8*    ProBNP (last 3 results) No results for input(s): PROBNP in the last 8760 hours.  CBG:  Recent Labs Lab 05/14/17 0756 05/14/17 1157 05/14/17 1629 05/14/17 2212 05/15/17 0758  GLUCAP 265* 233* 248* 233* 190*       SignedDomenic Polite MD.  [facility] 05/15/2017, 3:58 PM

## 2017-05-15 NOTE — Progress Notes (Signed)
EMS transport arranged.  

## 2017-05-15 NOTE — Progress Notes (Signed)
Called Report to [phone]. Colletta [place], RN not available. Provided hand off via SBAR format with contact information for RN to reach out should she have any questions.

## 2017-05-15 NOTE — Plan of Care (Signed)
Problem: Education: Goal: Knowledge of treatment and prevention of UTI/Pyleonephritis will improve Outcome: Completed/Met Date Met: 05/13/17 Education provided to patient/spouse. Questions, concerns denied

## 2017-05-18 ENCOUNTER — Encounter: Payer: Self-pay | Admitting: Radiation Oncology

## 2017-05-18 ENCOUNTER — Ambulatory Visit: Payer: Medicare Other

## 2017-05-19 ENCOUNTER — Ambulatory Visit: Payer: Medicare Other

## 2017-05-20 ENCOUNTER — Ambulatory Visit: Payer: Medicare Other

## 2017-05-21 ENCOUNTER — Ambulatory Visit: Payer: Medicare Other

## 2017-05-21 ENCOUNTER — Ambulatory Visit: Payer: Medicare Other | Admitting: Nurse Practitioner

## 2017-05-21 ENCOUNTER — Other Ambulatory Visit: Payer: Medicare Other

## 2017-05-22 ENCOUNTER — Ambulatory Visit: Payer: Medicare Other

## 2017-05-25 ENCOUNTER — Ambulatory Visit: Payer: Medicare Other

## 2017-05-25 NOTE — Progress Notes (Signed)
°  Radiation Oncology         [phone] ________________________________  Name: [name] [name] MRN: [id number]  Date: 05/18/2017  DOB: 06-27-1946  End of Treatment Note  Diagnosis:   Probable Stage IIA, cT3N0Mx adenocarcinoma of the pancreas.       ICD-10-CM   1. Carcinoma of body of pancreas ([place]) C25.1     Indication for treatment:  curative       Radiation treatment dates:   04/13/2017 to 05/14/2017  Site/dose:   The pancreas was treated to 36 Gy in 20 fractions of 1.8 Gy per fraction, out of a total planned dose of 54 Gy.  Beams/energy:   IMRT // 6X  Narrative: The patient voluntarily decided to discontinue treatment due to a decline in performance status.   Plan: The patient has voluntarily decided to discontinue radiation and chemotherapy treatment due to a decline in performance status and progression of disease seen on CT. I advised them to call if they have any questions or concerns related to their recovery or treatment.  ------------------------------------------------  [name], MD, PhD  This document serves as a record of services personally performed by Kyung Rudd, MD. It was created on his behalf by Arlyce Harman, a trained medical scribe. The creation of this record is based on the scribe's personal observations and the provider's statements to them. This document has been checked and approved by the attending provider.

## 2017-05-25 DEATH — deceased

## 2017-05-26 ENCOUNTER — Ambulatory Visit: Payer: Medicare Other

## 2017-05-27 ENCOUNTER — Ambulatory Visit: Payer: Medicare Other

## 2017-05-28 ENCOUNTER — Ambulatory Visit: Payer: Medicare Other

## 2017-05-29 ENCOUNTER — Ambulatory Visit: Payer: Medicare Other

## 2017-08-04 ENCOUNTER — Encounter (HOSPITAL_COMMUNITY): Payer: Medicare Other

## 2018-05-14 IMAGING — CT CT HEAD W/O CM
3 of 4 series · 15 of 47 positions shown, 18 images · non-contrast
Comparison: Head CT 12/27/2015

CLINICAL DATA: Status post fall.

EXAM:
CT HEAD WITHOUT CONTRAST
TECHNIQUE: Contiguous axial images were obtained from the base of the skull
through the vertex without intravenous contrast.

[Series 2: head w/o · axial · non-contrast · 0.45mm/px · z∈[+1456,+1576]mm · 9 of 30 slices shown, 12 images]
[im 3/30  brain]
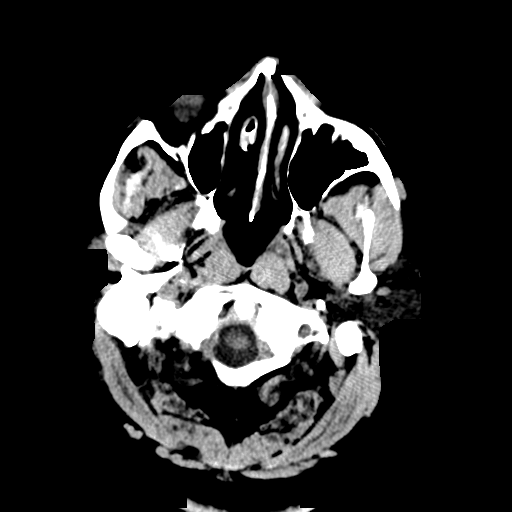
[im 3/30  bone]
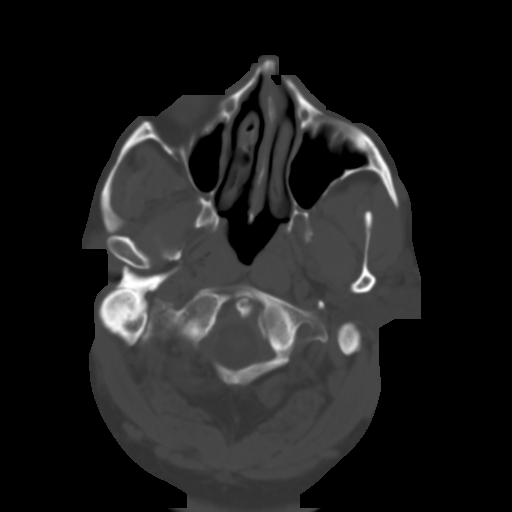
[im 6/30  brain]
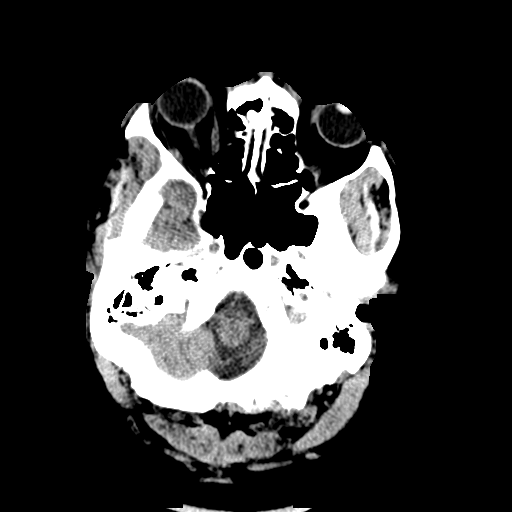
[im 9/30  brain]
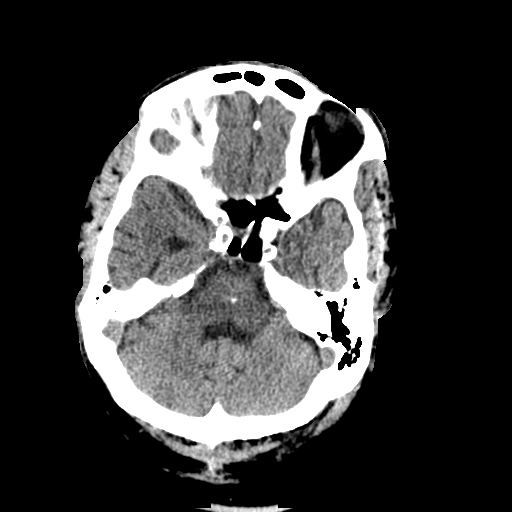
[im 12/30  brain]
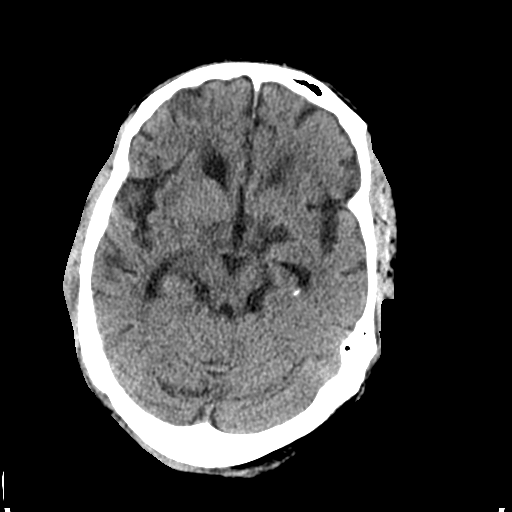
[im 15/30  brain]
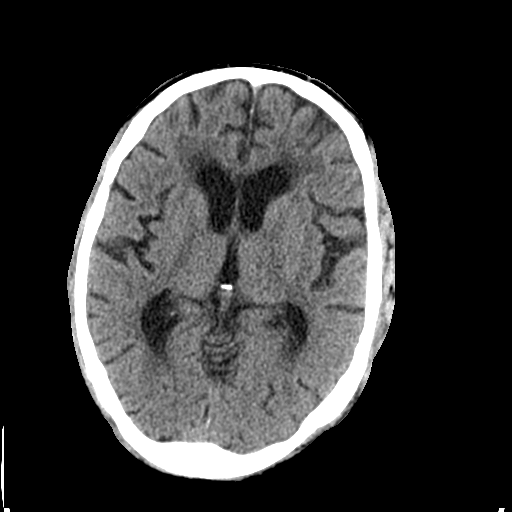
[im 15/30  bone]
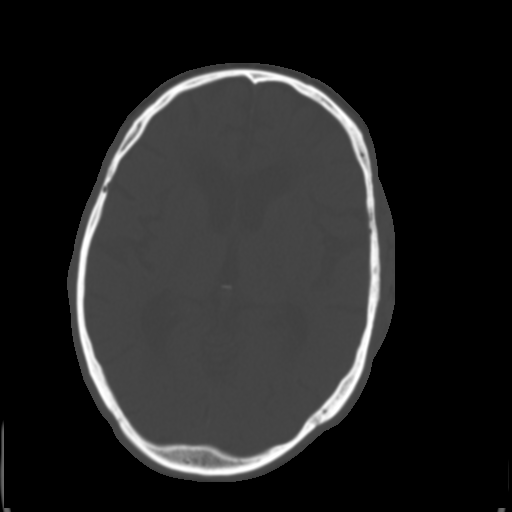
[im 18/30  brain]
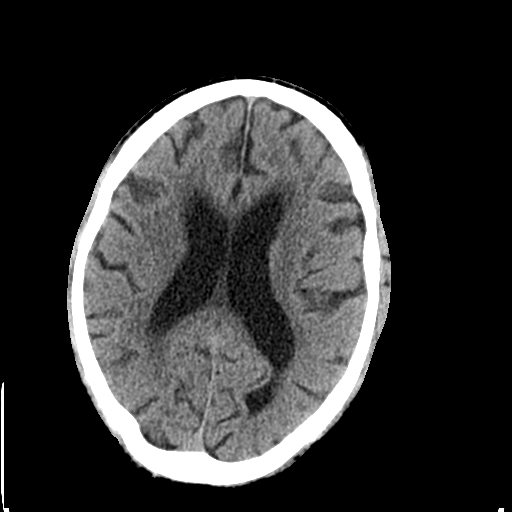
[im 21/30  brain]
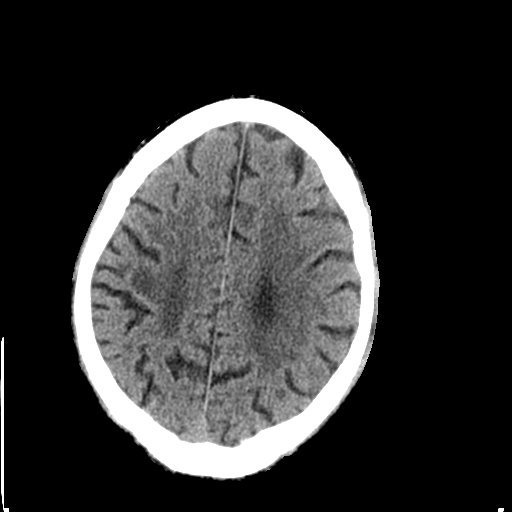
[im 24/30  brain]
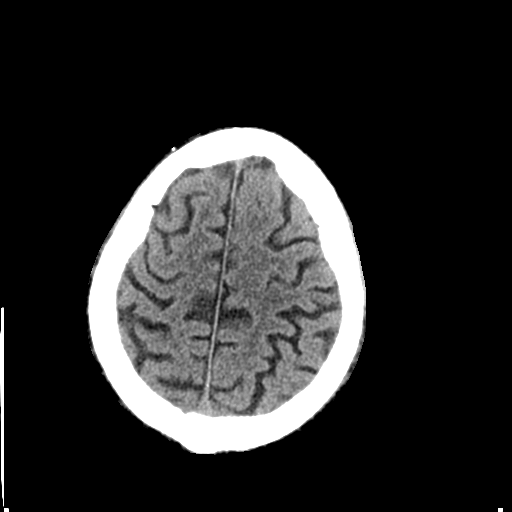
[im 27/30  brain]
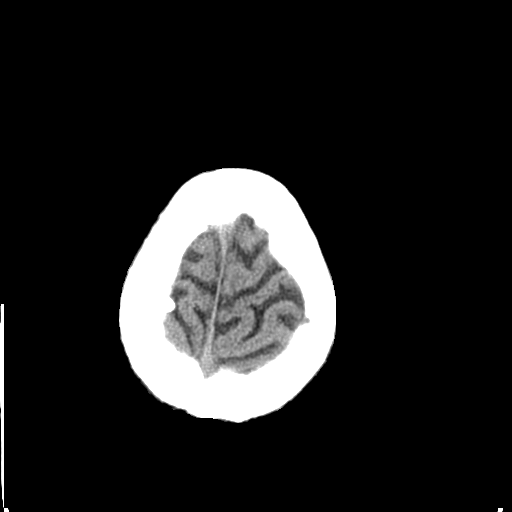
[im 27/30  bone]
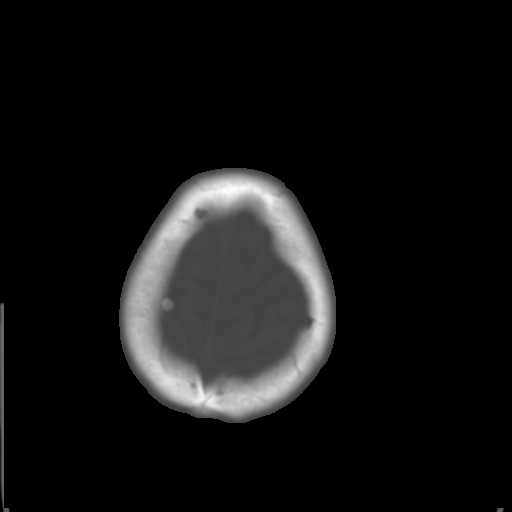

[Series 4: coronal · coronal · 0.29mm/px · 3 of 63 slices shown]
[im 21/63  brain]
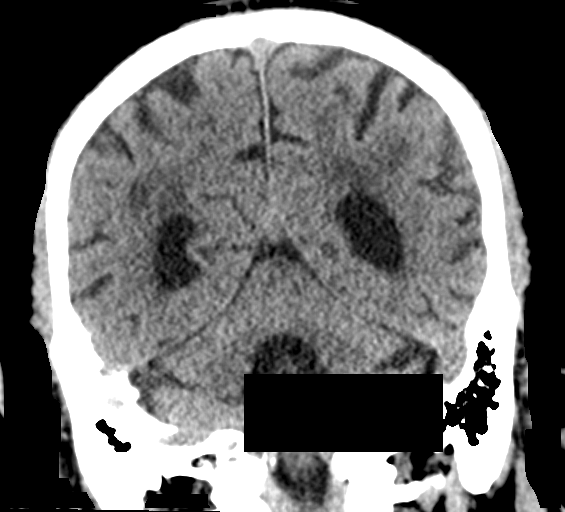
[im 28/63  brain]
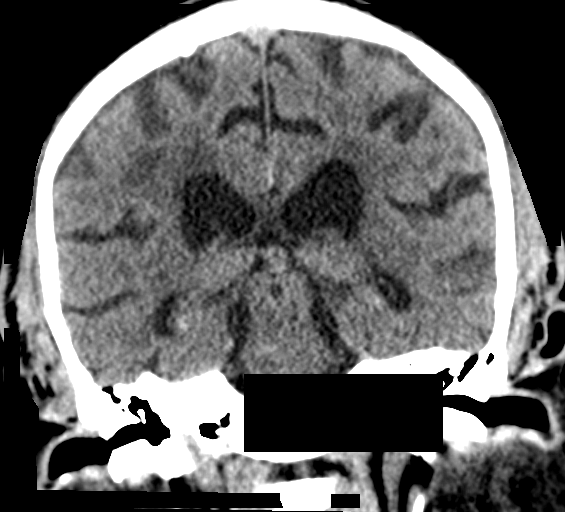
[im 35/63  brain]
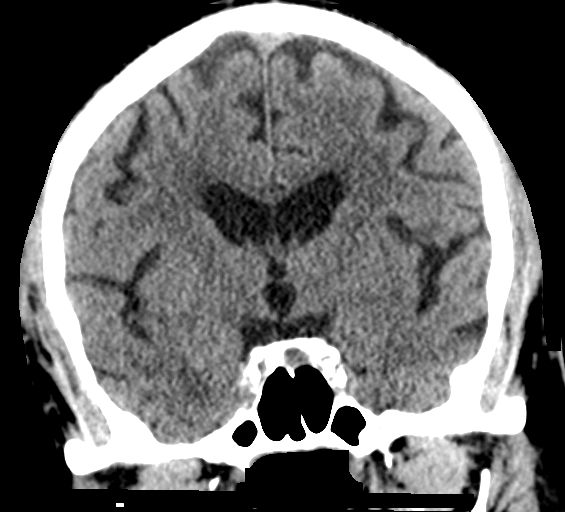

[Series 5: sagittal · sagittal · 0.29mm/px · 3 of 51 slices shown]
[im 17/51  brain]
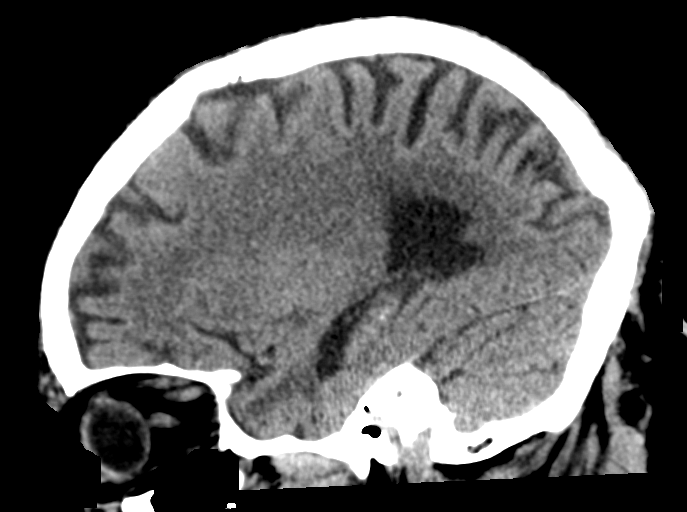
[im 26/51  brain]
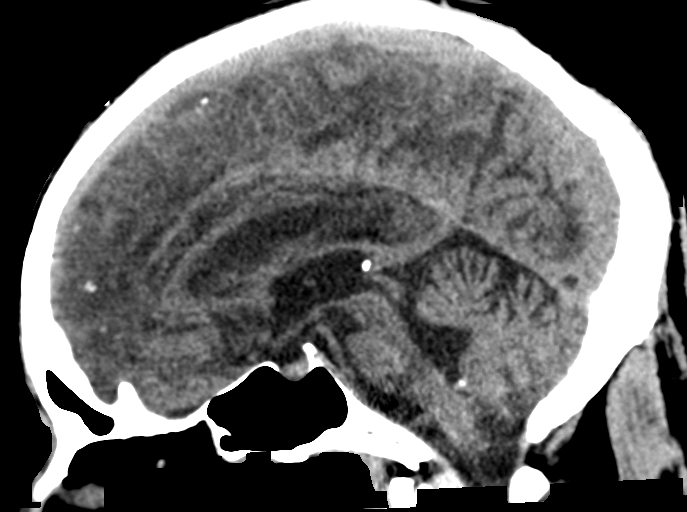
[im 34/51  brain]
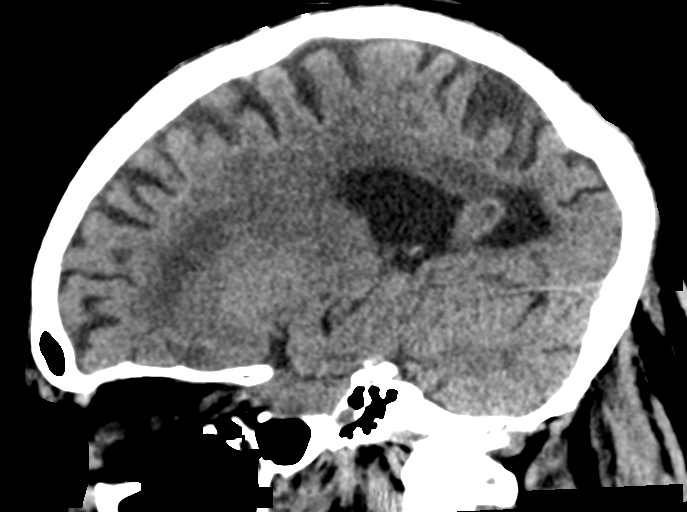

[15 of 47 positions shown; findings below may reference images not displayed]

FINDINGS: Brain: No mass lesion, intraparenchymal hemorrhage or extra-axial
collection. No evidence of acute cortical infarct. Hypoattenuation
within the posterior right frontal lobe is unchanged. Mild
periventricular hypoattenuation compatible with chronic
microvascular disease is again seen. Unchanged mineralization in the
brainstem.

Vascular: Atherosclerotic calcification of the internal carotid
artery is and vertebral arteries is present.

Skull: Normal visualized skull base, calvarium and extracranial soft
tissues.

Sinuses/Orbits: No sinus fluid levels or advanced mucosal
thickening. No mastoid effusion. Normal orbits.
IMPRESSION: 1. No acute intracranial abnormality.
2. Findings of chronic microvascular disease and suspected old right
frontal infarct.

## 2021-02-07 ENCOUNTER — Encounter: Payer: Self-pay | Admitting: Genetic Counselor
